# Patient Record
Sex: Male | Born: 1935
Health system: Southern US, Community
[De-identification: ages and names within clinical notes are randomized; demographics above are authoritative.]

## PROBLEM LIST (undated history)

## (undated) DIAGNOSIS — Z7189 Other specified counseling: Secondary | ICD-10-CM

## (undated) DIAGNOSIS — C9002 Multiple myeloma in relapse: Secondary | ICD-10-CM

## (undated) DIAGNOSIS — C9000 Multiple myeloma not having achieved remission: Secondary | ICD-10-CM

## (undated) DIAGNOSIS — N189 Chronic kidney disease, unspecified: Secondary | ICD-10-CM

## (undated) DIAGNOSIS — I10 Essential (primary) hypertension: Secondary | ICD-10-CM

## (undated) DIAGNOSIS — IMO0002 Reserved for concepts with insufficient information to code with codable children: Secondary | ICD-10-CM

## (undated) DIAGNOSIS — K4090 Unilateral inguinal hernia, without obstruction or gangrene, not specified as recurrent: Secondary | ICD-10-CM

## (undated) DIAGNOSIS — IMO0001 Reserved for inherently not codable concepts without codable children: Secondary | ICD-10-CM

## (undated) DIAGNOSIS — Z5189 Encounter for other specified aftercare: Secondary | ICD-10-CM

## (undated) DIAGNOSIS — I639 Cerebral infarction, unspecified: Secondary | ICD-10-CM

## (undated) DIAGNOSIS — D649 Anemia, unspecified: Secondary | ICD-10-CM

## (undated) DIAGNOSIS — Z923 Personal history of irradiation: Secondary | ICD-10-CM

## (undated) HISTORY — DX: Encounter for other specified aftercare: Z51.89

## (undated) HISTORY — DX: Reserved for concepts with insufficient information to code with codable children: IMO0002

## (undated) HISTORY — PX: TONSILLECTOMY: SUR1361

## (undated) HISTORY — DX: Hypercalcemia: E83.52

## (undated) HISTORY — PX: COLONOSCOPY: SHX174

## (undated) HISTORY — DX: Anemia, unspecified: D64.9

## (undated) HISTORY — DX: Multiple myeloma in relapse: C90.02

## (undated) HISTORY — DX: Cerebral infarction, unspecified: I63.9

## (undated) HISTORY — DX: Reserved for inherently not codable concepts without codable children: IMO0001

## (undated) HISTORY — DX: Personal history of irradiation: Z92.3

---
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
## 2000-02-19 ENCOUNTER — Ambulatory Visit (HOSPITAL_COMMUNITY): Admission: RE | Admit: 2000-02-19 | Discharge: 2000-02-19 | Payer: Self-pay | Admitting: Gastroenterology

## 2006-02-18 ENCOUNTER — Ambulatory Visit: Payer: Self-pay | Admitting: Hematology & Oncology

## 2006-04-03 LAB — CBC WITH DIFFERENTIAL/PLATELET
Basophils Absolute: 0 10*3/uL (ref 0.0–0.1)
Eosinophils Absolute: 0.1 10*3/uL (ref 0.0–0.5)
HGB: 12.1 g/dL — ABNORMAL LOW (ref 13.0–17.1)
MCV: 94.2 fL (ref 81.6–98.0)
MONO%: 9.3 % (ref 0.0–13.0)
NEUT#: 1.7 10*3/uL (ref 1.5–6.5)
RBC: 3.63 10*6/uL — ABNORMAL LOW (ref 4.20–5.71)
RDW: 13.7 % (ref 11.2–14.6)
WBC: 2.5 10*3/uL — ABNORMAL LOW (ref 4.0–10.0)
lymph#: 0.5 10*3/uL — ABNORMAL LOW (ref 0.9–3.3)

## 2006-04-03 LAB — CHCC SMEAR

## 2006-04-06 LAB — KAPPA/LAMBDA LIGHT CHAINS
Kappa free light chain: 1.41 mg/dL (ref 0.33–1.94)
Kappa:Lambda Ratio: 0.08 — ABNORMAL LOW (ref 0.26–1.65)
Lambda Free Lght Chn: 18.8 mg/dL — ABNORMAL HIGH (ref 0.57–2.63)

## 2006-04-06 LAB — PROTEIN ELECTROPHORESIS, SERUM
Albumin ELP: 39.6 % — ABNORMAL LOW (ref 55.8–66.1)
Alpha-2-Globulin: 7.9 % (ref 7.1–11.8)
Beta Globulin: 3.8 % — ABNORMAL LOW (ref 4.7–7.2)
M-Spike, %: 2.8 g/dL
Total Protein, Serum Electrophoresis: 8.6 g/dL — ABNORMAL HIGH (ref 6.0–8.3)

## 2006-04-06 LAB — BETA 2 MICROGLOBULIN, SERUM: Beta-2 Microglobulin: 1.96 mg/L — ABNORMAL HIGH (ref 1.01–1.73)

## 2006-04-06 LAB — IGG, IGA, IGM: IgG (Immunoglobin G), Serum: 4440 mg/dL — ABNORMAL HIGH (ref 694–1618)

## 2006-04-09 LAB — UIFE/LIGHT CHAINS/TP QN, 24-HR UR
Beta, Urine: DETECTED — AB
Free Kappa Lt Chains,Ur: 1.72 mg/dL — ABNORMAL HIGH (ref 0.04–1.51)
Free Lambda Excretion/Day: 45.18 mg/d
Free Lambda Lt Chains,Ur: 2.51 mg/dL — ABNORMAL HIGH (ref 0.08–1.01)
Free Lt Chn Excr Rate: 30.96 mg/d
Gamma Globulin, Urine: DETECTED — AB
Time: 24 hours
Volume, Urine: 1800 mL

## 2006-04-11 ENCOUNTER — Ambulatory Visit: Payer: Self-pay | Admitting: Hematology & Oncology

## 2006-08-15 ENCOUNTER — Ambulatory Visit: Payer: Self-pay | Admitting: Hematology & Oncology

## 2006-08-23 LAB — KAPPA/LAMBDA LIGHT CHAINS: Lambda Free Lght Chn: 22.9 mg/dL — ABNORMAL HIGH (ref 0.57–2.63)

## 2006-08-23 LAB — PROTEIN ELECTROPHORESIS, SERUM
Albumin ELP: 39.8 % — ABNORMAL LOW (ref 55.8–66.1)
Alpha-1-Globulin: 3.3 % (ref 2.9–4.9)
Beta 2: 2.2 % — ABNORMAL LOW (ref 3.2–6.5)
Beta Globulin: 4 % — ABNORMAL LOW (ref 4.7–7.2)
Total Protein, Serum Electrophoresis: 9.1 g/dL — ABNORMAL HIGH (ref 6.0–8.3)

## 2006-08-23 LAB — BETA 2 MICROGLOBULIN, SERUM: Beta-2 Microglobulin: 2.21 mg/L — ABNORMAL HIGH (ref 1.01–1.73)

## 2006-12-22 ENCOUNTER — Ambulatory Visit: Payer: Self-pay | Admitting: Hematology & Oncology

## 2006-12-24 LAB — CBC WITH DIFFERENTIAL/PLATELET
Basophils Absolute: 0 10*3/uL (ref 0.0–0.1)
EOS%: 3.2 % (ref 0.0–7.0)
HCT: 32.7 % — ABNORMAL LOW (ref 38.7–49.9)
HGB: 11.8 g/dL — ABNORMAL LOW (ref 13.0–17.1)
MCH: 33.9 pg — ABNORMAL HIGH (ref 28.0–33.4)
MCV: 94 fL (ref 81.6–98.0)
MONO%: 10.4 % (ref 0.0–13.0)
NEUT%: 63.3 % (ref 40.0–75.0)
RDW: 13.2 % (ref 11.2–14.6)

## 2006-12-26 LAB — PROTEIN ELECTROPHORESIS, SERUM
Beta Globulin: 3.9 % — ABNORMAL LOW (ref 4.7–7.2)
Gamma Globulin: 43.8 % — ABNORMAL HIGH (ref 11.1–18.8)
M-Spike, %: 3.74 g/dL
Total Protein, Serum Electrophoresis: 8.9 g/dL — ABNORMAL HIGH (ref 6.0–8.3)

## 2006-12-26 LAB — COMPREHENSIVE METABOLIC PANEL
ALT: 12 U/L (ref 0–53)
AST: 13 U/L (ref 0–37)
Albumin: 3 g/dL — ABNORMAL LOW (ref 3.5–5.2)
Alkaline Phosphatase: 60 U/L (ref 39–117)
BUN: 22 mg/dL (ref 6–23)
CO2: 27 mEq/L (ref 19–32)
Calcium: 8.2 mg/dL — ABNORMAL LOW (ref 8.4–10.5)
Chloride: 104 mEq/L (ref 96–112)
Creatinine, Ser: 0.99 mg/dL (ref 0.40–1.50)
Glucose, Bld: 87 mg/dL (ref 70–99)
Potassium: 4.3 mEq/L (ref 3.5–5.3)
Sodium: 135 mEq/L (ref 135–145)
Total Bilirubin: 0.6 mg/dL (ref 0.3–1.2)
Total Protein: 8.9 g/dL — ABNORMAL HIGH (ref 6.0–8.3)

## 2006-12-26 LAB — IGG, IGA, IGM
IgG (Immunoglobin G), Serum: 4960 mg/dL — ABNORMAL HIGH (ref 694–1618)
IgM, Serum: 24 mg/dL — ABNORMAL LOW (ref 60–263)

## 2006-12-26 LAB — KAPPA/LAMBDA LIGHT CHAINS
Kappa:Lambda Ratio: 0.03 — ABNORMAL LOW (ref 0.26–1.65)
Lambda Free Lght Chn: 22.1 mg/dL — ABNORMAL HIGH (ref 0.57–2.63)

## 2006-12-26 LAB — BETA 2 MICROGLOBULIN, SERUM: Beta-2 Microglobulin: 2.47 mg/L — ABNORMAL HIGH (ref 1.01–1.73)

## 2007-04-20 ENCOUNTER — Ambulatory Visit: Payer: Self-pay | Admitting: Hematology & Oncology

## 2007-04-22 LAB — CBC WITH DIFFERENTIAL/PLATELET
BASO%: 0.3 % (ref 0.0–2.0)
Eosinophils Absolute: 0.1 10*3/uL (ref 0.0–0.5)
LYMPH%: 18.9 % (ref 14.0–48.0)
MCHC: 36.5 g/dL — ABNORMAL HIGH (ref 32.0–35.9)
MCV: 94.4 fL (ref 81.6–98.0)
MONO%: 9.4 % (ref 0.0–13.0)
NEUT#: 1.9 10*3/uL (ref 1.5–6.5)
RBC: 3.28 10*6/uL — ABNORMAL LOW (ref 4.20–5.71)
RDW: 13.8 % (ref 11.2–14.6)
WBC: 2.8 10*3/uL — ABNORMAL LOW (ref 4.0–10.0)

## 2007-04-24 LAB — COMPREHENSIVE METABOLIC PANEL
ALT: 16 U/L (ref 0–53)
AST: 18 U/L (ref 0–37)
Albumin: 3.1 g/dL — ABNORMAL LOW (ref 3.5–5.2)
Alkaline Phosphatase: 55 U/L (ref 39–117)
Glucose, Bld: 83 mg/dL (ref 70–99)
Potassium: 4.3 mEq/L (ref 3.5–5.3)
Sodium: 133 mEq/L — ABNORMAL LOW (ref 135–145)
Total Bilirubin: 0.7 mg/dL (ref 0.3–1.2)
Total Protein: 9.1 g/dL — ABNORMAL HIGH (ref 6.0–8.3)

## 2007-04-24 LAB — PROTEIN ELECTROPHORESIS, SERUM
Alpha-2-Globulin: 7.6 % (ref 7.1–11.8)
Beta 2: 1.9 % — ABNORMAL LOW (ref 3.2–6.5)
Beta Globulin: 4 % — ABNORMAL LOW (ref 4.7–7.2)
M-Spike, %: 3.85 g/dL
Total Protein, Serum Electrophoresis: 9.1 g/dL — ABNORMAL HIGH (ref 6.0–8.3)

## 2007-04-24 LAB — KAPPA/LAMBDA LIGHT CHAINS
Kappa free light chain: 1.24 mg/dL (ref 0.33–1.94)
Kappa:Lambda Ratio: 0.06 — ABNORMAL LOW (ref 0.26–1.65)
Lambda Free Lght Chn: 22.5 mg/dL — ABNORMAL HIGH (ref 0.57–2.63)

## 2007-04-24 LAB — IGG, IGA, IGM: IgG (Immunoglobin G), Serum: 5170 mg/dL — ABNORMAL HIGH (ref 694–1618)

## 2008-09-21 ENCOUNTER — Encounter: Admission: RE | Admit: 2008-09-21 | Discharge: 2008-09-21 | Payer: Self-pay | Admitting: Otolaryngology

## 2008-09-26 ENCOUNTER — Encounter (INDEPENDENT_AMBULATORY_CARE_PROVIDER_SITE_OTHER): Payer: Self-pay | Admitting: Otolaryngology

## 2008-09-26 ENCOUNTER — Ambulatory Visit (HOSPITAL_BASED_OUTPATIENT_CLINIC_OR_DEPARTMENT_OTHER): Admission: RE | Admit: 2008-09-26 | Discharge: 2008-09-26 | Payer: Self-pay | Admitting: Otolaryngology

## 2009-02-23 ENCOUNTER — Ambulatory Visit: Payer: Self-pay | Admitting: Hematology & Oncology

## 2009-03-22 LAB — CBC WITH DIFFERENTIAL (CANCER CENTER ONLY)
BASO%: 0.2 % (ref 0.0–2.0)
EOS%: 2.2 % (ref 0.0–7.0)
HCT: 24.5 % — ABNORMAL LOW (ref 38.7–49.9)
LYMPH%: 25.1 % (ref 14.0–48.0)
MCH: 33.7 pg — ABNORMAL HIGH (ref 28.0–33.4)
MCHC: 35.1 g/dL (ref 32.0–35.9)
MCV: 96 fL (ref 82–98)
MONO%: 5.9 % (ref 0.0–13.0)
NEUT%: 66.6 % (ref 40.0–80.0)
RDW: 11.8 % (ref 10.5–14.6)

## 2009-03-24 LAB — COMPREHENSIVE METABOLIC PANEL
Albumin: 2.4 g/dL — ABNORMAL LOW (ref 3.5–5.2)
Alkaline Phosphatase: 57 U/L (ref 39–117)
BUN: 25 mg/dL — ABNORMAL HIGH (ref 6–23)
CO2: 22 mEq/L (ref 19–32)
Glucose, Bld: 100 mg/dL — ABNORMAL HIGH (ref 70–99)
Potassium: 4.4 mEq/L (ref 3.5–5.3)
Sodium: 133 mEq/L — ABNORMAL LOW (ref 135–145)
Total Protein: 9.3 g/dL — ABNORMAL HIGH (ref 6.0–8.3)

## 2009-03-24 LAB — LACTATE DEHYDROGENASE: LDH: 131 U/L (ref 94–250)

## 2009-03-24 LAB — SPEP & IFE WITH QIG
Alpha-1-Globulin: 3.3 % (ref 2.9–4.9)
Beta 2: 1.3 % — ABNORMAL LOW (ref 3.2–6.5)
Beta Globulin: 4 % — ABNORMAL LOW (ref 4.7–7.2)
Gamma Globulin: 48.7 % — ABNORMAL HIGH (ref 11.1–18.8)
IgA: 12 mg/dL — ABNORMAL LOW (ref 68–378)
IgM, Serum: 13 mg/dL — ABNORMAL LOW (ref 60–263)

## 2009-03-24 LAB — KAPPA/LAMBDA LIGHT CHAINS: Lambda Free Lght Chn: 63.7 mg/dL — ABNORMAL HIGH (ref 0.57–2.63)

## 2009-03-24 LAB — FERRITIN: Ferritin: 136 ng/mL (ref 22–322)

## 2009-03-24 LAB — ERYTHROPOIETIN: Erythropoietin: 41.9 m[IU]/mL — ABNORMAL HIGH (ref 2.6–34.0)

## 2009-03-24 LAB — BETA 2 MICROGLOBULIN, SERUM: Beta-2 Microglobulin: 4.75 mg/L — ABNORMAL HIGH (ref 1.01–1.73)

## 2009-03-27 ENCOUNTER — Ambulatory Visit: Payer: Self-pay | Admitting: Hematology & Oncology

## 2009-03-28 ENCOUNTER — Encounter: Payer: Self-pay | Admitting: Hematology & Oncology

## 2009-03-28 ENCOUNTER — Other Ambulatory Visit: Admission: RE | Admit: 2009-03-28 | Discharge: 2009-03-28 | Payer: Self-pay | Admitting: Hematology & Oncology

## 2009-03-28 LAB — CBC WITH DIFFERENTIAL (CANCER CENTER ONLY)
EOS%: 3.2 % (ref 0.0–7.0)
HGB: 8.4 g/dL — ABNORMAL LOW (ref 13.0–17.1)
LYMPH#: 0.6 10*3/uL — ABNORMAL LOW (ref 0.9–3.3)
MCH: 33 pg (ref 28.0–33.4)
MCHC: 34.3 g/dL (ref 32.0–35.9)
MONO%: 6.8 % (ref 0.0–13.0)
NEUT#: 1.4 10*3/uL — ABNORMAL LOW (ref 1.5–6.5)
Platelets: 205 10*3/uL (ref 145–400)
RBC: 2.56 10*6/uL — ABNORMAL LOW (ref 4.20–5.70)

## 2009-04-06 LAB — CBC WITH DIFFERENTIAL (CANCER CENTER ONLY)
BASO%: 0.4 % (ref 0.0–2.0)
EOS%: 4.5 % (ref 0.0–7.0)
HCT: 25 % — ABNORMAL LOW (ref 38.7–49.9)
LYMPH#: 0.6 10*3/uL — ABNORMAL LOW (ref 0.9–3.3)
MCHC: 35.2 g/dL (ref 32.0–35.9)
NEUT#: 1.1 10*3/uL — ABNORMAL LOW (ref 1.5–6.5)
NEUT%: 57.3 % (ref 40.0–80.0)
Platelets: 224 10*3/uL (ref 145–400)
RDW: 11.5 % (ref 10.5–14.6)

## 2009-04-06 LAB — COMPREHENSIVE METABOLIC PANEL
Albumin: 2.3 g/dL — ABNORMAL LOW (ref 3.5–5.2)
BUN: 28 mg/dL — ABNORMAL HIGH (ref 6–23)
CO2: 25 mEq/L (ref 19–32)
Calcium: 8 mg/dL — ABNORMAL LOW (ref 8.4–10.5)
Chloride: 103 mEq/L (ref 96–112)
Glucose, Bld: 94 mg/dL (ref 70–99)
Potassium: 5.2 mEq/L (ref 3.5–5.3)
Sodium: 132 mEq/L — ABNORMAL LOW (ref 135–145)
Total Protein: 9.2 g/dL — ABNORMAL HIGH (ref 6.0–8.3)

## 2009-04-12 DIAGNOSIS — C9000 Multiple myeloma not having achieved remission: Secondary | ICD-10-CM

## 2009-04-12 HISTORY — DX: Multiple myeloma not having achieved remission: C90.00

## 2009-04-14 LAB — BASIC METABOLIC PANEL
CO2: 23 mEq/L (ref 19–32)
Calcium: 7.8 mg/dL — ABNORMAL LOW (ref 8.4–10.5)
Chloride: 107 mEq/L (ref 96–112)
Glucose, Bld: 109 mg/dL — ABNORMAL HIGH (ref 70–99)
Potassium: 4.7 mEq/L (ref 3.5–5.3)
Sodium: 132 mEq/L — ABNORMAL LOW (ref 135–145)

## 2009-04-21 LAB — CBC WITH DIFFERENTIAL (CANCER CENTER ONLY)
BASO#: 0.1 10*3/uL (ref 0.0–0.2)
BASO%: 2 % (ref 0.0–2.0)
EOS%: 6.2 % (ref 0.0–7.0)
HGB: 8.1 g/dL — ABNORMAL LOW (ref 13.0–17.1)
LYMPH#: 0.3 10*3/uL — ABNORMAL LOW (ref 0.9–3.3)
MCH: 33.4 pg (ref 28.0–33.4)
MCHC: 34.6 g/dL (ref 32.0–35.9)
MONO%: 3.7 % (ref 0.0–13.0)
NEUT#: 2.8 10*3/uL (ref 1.5–6.5)
NEUT%: 79.4 % (ref 40.0–80.0)
RDW: 11.7 % (ref 10.5–14.6)

## 2009-04-27 ENCOUNTER — Ambulatory Visit: Payer: Self-pay | Admitting: Hematology & Oncology

## 2009-04-28 LAB — CBC WITH DIFFERENTIAL (CANCER CENTER ONLY)
BASO#: 0 10*3/uL (ref 0.0–0.2)
BASO%: 0.5 % (ref 0.0–2.0)
Eosinophils Absolute: 0.2 10*3/uL (ref 0.0–0.5)
HCT: 25.1 % — ABNORMAL LOW (ref 38.7–49.9)
HGB: 8.9 g/dL — ABNORMAL LOW (ref 13.0–17.1)
LYMPH#: 0.4 10*3/uL — ABNORMAL LOW (ref 0.9–3.3)
LYMPH%: 16.9 % (ref 14.0–48.0)
MCV: 96 fL (ref 82–98)
MONO#: 0.3 10*3/uL (ref 0.1–0.9)
NEUT%: 63.8 % (ref 40.0–80.0)
RDW: 12.1 % (ref 10.5–14.6)
WBC: 2.1 10*3/uL — ABNORMAL LOW (ref 4.0–10.0)

## 2009-05-05 LAB — CBC WITH DIFFERENTIAL (CANCER CENTER ONLY)
BASO#: 0 10*3/uL (ref 0.0–0.2)
BASO%: 0.7 % (ref 0.0–2.0)
EOS%: 7.5 % — ABNORMAL HIGH (ref 0.0–7.0)
HCT: 27.9 % — ABNORMAL LOW (ref 38.7–49.9)
HGB: 9.7 g/dL — ABNORMAL LOW (ref 13.0–17.1)
LYMPH%: 18 % (ref 14.0–48.0)
MCH: 33 pg (ref 28.0–33.4)
MCHC: 34.6 g/dL (ref 32.0–35.9)
MCV: 95 fL (ref 82–98)
MONO%: 10 % (ref 0.0–13.0)
NEUT%: 63.8 % (ref 40.0–80.0)
RDW: 12.4 % (ref 10.5–14.6)

## 2009-05-05 LAB — CMP (CANCER CENTER ONLY)
ALT(SGPT): 15 U/L (ref 10–47)
BUN, Bld: 21 mg/dL (ref 7–22)
CO2: 27 mEq/L (ref 18–33)
Calcium: 8.2 mg/dL (ref 8.0–10.3)
Chloride: 105 mEq/L (ref 98–108)
Creat: 1.4 mg/dl — ABNORMAL HIGH (ref 0.6–1.2)
Glucose, Bld: 122 mg/dL — ABNORMAL HIGH (ref 73–118)
Total Bilirubin: 0.8 mg/dl (ref 0.20–1.60)

## 2009-05-09 LAB — PROTEIN ELECTROPHORESIS, SERUM
Alpha-2-Globulin: 12.2 % — ABNORMAL HIGH (ref 7.1–11.8)
Beta Globulin: 5.2 % (ref 4.7–7.2)
Gamma Globulin: 33.9 % — ABNORMAL HIGH (ref 11.1–18.8)
M-Spike, %: 2.26 g/dL

## 2009-05-09 LAB — BETA 2 MICROGLOBULIN, SERUM: Beta-2 Microglobulin: 3.63 mg/L — ABNORMAL HIGH (ref 1.01–1.73)

## 2009-05-09 LAB — KAPPA/LAMBDA LIGHT CHAINS
Kappa free light chain: 1.24 mg/dL (ref 0.33–1.94)
Lambda Free Lght Chn: 17.8 mg/dL — ABNORMAL HIGH (ref 0.57–2.63)

## 2009-05-09 LAB — IGG, IGA, IGM
IgA: 24 mg/dL — ABNORMAL LOW (ref 68–378)
IgG (Immunoglobin G), Serum: 3140 mg/dL — ABNORMAL HIGH (ref 694–1618)

## 2009-05-18 LAB — CMP (CANCER CENTER ONLY)
ALT(SGPT): 17 U/L (ref 10–47)
Albumin: 2.6 g/dL — ABNORMAL LOW (ref 3.3–5.5)
CO2: 30 mEq/L (ref 18–33)
Calcium: 8.7 mg/dL (ref 8.0–10.3)
Chloride: 107 mEq/L (ref 98–108)
Glucose, Bld: 135 mg/dL — ABNORMAL HIGH (ref 73–118)
Sodium: 142 mEq/L (ref 128–145)
Total Bilirubin: 0.7 mg/dl (ref 0.20–1.60)
Total Protein: 6.7 g/dL (ref 6.4–8.1)

## 2009-05-18 LAB — CBC WITH DIFFERENTIAL (CANCER CENTER ONLY)
BASO#: 0 10*3/uL (ref 0.0–0.2)
Eosinophils Absolute: 0.2 10*3/uL (ref 0.0–0.5)
HGB: 9.4 g/dL — ABNORMAL LOW (ref 13.0–17.1)
MCH: 33.1 pg (ref 28.0–33.4)
MONO#: 0.2 10*3/uL (ref 0.1–0.9)
MONO%: 6.9 % (ref 0.0–13.0)
NEUT#: 1.7 10*3/uL (ref 1.5–6.5)
RBC: 2.84 10*6/uL — ABNORMAL LOW (ref 4.20–5.70)
WBC: 2.7 10*3/uL — ABNORMAL LOW (ref 4.0–10.0)

## 2009-05-26 LAB — CBC WITH DIFFERENTIAL (CANCER CENTER ONLY)
Eosinophils Absolute: 0.4 10*3/uL (ref 0.0–0.5)
HCT: 31.1 % — ABNORMAL LOW (ref 38.7–49.9)
LYMPH#: 0.6 10*3/uL — ABNORMAL LOW (ref 0.9–3.3)
LYMPH%: 15.1 % (ref 14.0–48.0)
MCV: 95 fL (ref 82–98)
MONO#: 0.5 10*3/uL (ref 0.1–0.9)
Platelets: 196 10*3/uL (ref 145–400)
RBC: 3.29 10*6/uL — ABNORMAL LOW (ref 4.20–5.70)
WBC: 3.9 10*3/uL — ABNORMAL LOW (ref 4.0–10.0)

## 2009-05-26 LAB — BASIC METABOLIC PANEL
Calcium: 7.6 mg/dL — ABNORMAL LOW (ref 8.4–10.5)
Glucose, Bld: 130 mg/dL — ABNORMAL HIGH (ref 70–99)
Potassium: 4.3 mEq/L (ref 3.5–5.3)
Sodium: 141 mEq/L (ref 135–145)

## 2009-06-02 ENCOUNTER — Ambulatory Visit: Payer: Self-pay | Admitting: Hematology & Oncology

## 2009-06-02 LAB — CBC WITH DIFFERENTIAL (CANCER CENTER ONLY)
BASO#: 0 10*3/uL (ref 0.0–0.2)
Eosinophils Absolute: 0.9 10*3/uL — ABNORMAL HIGH (ref 0.0–0.5)
HCT: 33.4 % — ABNORMAL LOW (ref 38.7–49.9)
HGB: 11.4 g/dL — ABNORMAL LOW (ref 13.0–17.1)
LYMPH%: 14.3 % (ref 14.0–48.0)
MCH: 32.3 pg (ref 28.0–33.4)
MCV: 95 fL (ref 82–98)
MONO#: 0.5 10*3/uL (ref 0.1–0.9)
MONO%: 12.3 % (ref 0.0–13.0)
NEUT%: 48.2 % (ref 40.0–80.0)
Platelets: 148 10*3/uL (ref 145–400)
RBC: 3.53 10*6/uL — ABNORMAL LOW (ref 4.20–5.70)
WBC: 3.8 10*3/uL — ABNORMAL LOW (ref 4.0–10.0)

## 2009-06-09 LAB — CBC WITH DIFFERENTIAL (CANCER CENTER ONLY)
BASO%: 0.8 % (ref 0.0–2.0)
HCT: 34 % — ABNORMAL LOW (ref 38.7–49.9)
LYMPH%: 20.4 % (ref 14.0–48.0)
MCH: 31.7 pg (ref 28.0–33.4)
MCHC: 34.4 g/dL (ref 32.0–35.9)
MCV: 92 fL (ref 82–98)
MONO#: 0.4 10*3/uL (ref 0.1–0.9)
MONO%: 10.2 % (ref 0.0–13.0)
NEUT%: 62.5 % (ref 40.0–80.0)
Platelets: 211 10*3/uL (ref 145–400)
RDW: 12.9 % (ref 10.5–14.6)

## 2009-06-29 LAB — CBC WITH DIFFERENTIAL (CANCER CENTER ONLY)
BASO#: 0 10*3/uL (ref 0.0–0.2)
EOS%: 15.3 % — ABNORMAL HIGH (ref 0.0–7.0)
Eosinophils Absolute: 0.6 10*3/uL — ABNORMAL HIGH (ref 0.0–0.5)
HGB: 10.4 g/dL — ABNORMAL LOW (ref 13.0–17.1)
LYMPH#: 0.7 10*3/uL — ABNORMAL LOW (ref 0.9–3.3)
MCHC: 35.4 g/dL (ref 32.0–35.9)
NEUT#: 1.9 10*3/uL (ref 1.5–6.5)
Platelets: 113 10*3/uL — ABNORMAL LOW (ref 145–400)
RBC: 3.38 10*6/uL — ABNORMAL LOW (ref 4.20–5.70)

## 2009-07-03 LAB — COMPREHENSIVE METABOLIC PANEL
ALT: 15 U/L (ref 0–53)
Albumin: 3.2 g/dL — ABNORMAL LOW (ref 3.5–5.2)
CO2: 25 mEq/L (ref 19–32)
Calcium: 8.1 mg/dL — ABNORMAL LOW (ref 8.4–10.5)
Chloride: 109 mEq/L (ref 96–112)
Glucose, Bld: 86 mg/dL (ref 70–99)
Potassium: 4.1 mEq/L (ref 3.5–5.3)
Sodium: 141 mEq/L (ref 135–145)
Total Protein: 5.4 g/dL — ABNORMAL LOW (ref 6.0–8.3)

## 2009-07-03 LAB — IGG, IGA, IGM
IgG (Immunoglobin G), Serum: 1070 mg/dL (ref 694–1618)
IgM, Serum: 33 mg/dL — ABNORMAL LOW (ref 60–263)

## 2009-07-03 LAB — PROTEIN ELECTROPHORESIS, SERUM: Gamma Globulin: 17.9 % (ref 11.1–18.8)

## 2009-07-03 LAB — KAPPA/LAMBDA LIGHT CHAINS
Kappa free light chain: 1.62 mg/dL (ref 0.33–1.94)
Lambda Free Lght Chn: 7.21 mg/dL — ABNORMAL HIGH (ref 0.57–2.63)

## 2009-07-05 ENCOUNTER — Ambulatory Visit: Payer: Self-pay | Admitting: Hematology & Oncology

## 2009-07-07 LAB — CBC WITH DIFFERENTIAL (CANCER CENTER ONLY)
BASO%: 0.7 % (ref 0.0–2.0)
EOS%: 7.1 % — ABNORMAL HIGH (ref 0.0–7.0)
LYMPH#: 0.9 10*3/uL (ref 0.9–3.3)
MCHC: 35 g/dL (ref 32.0–35.9)
NEUT#: 1.6 10*3/uL (ref 1.5–6.5)
Platelets: 178 10*3/uL (ref 145–400)
RDW: 12.7 % (ref 10.5–14.6)

## 2009-07-26 LAB — CBC WITH DIFFERENTIAL (CANCER CENTER ONLY)
BASO#: 0 10*3/uL (ref 0.0–0.2)
EOS%: 12.2 % — ABNORMAL HIGH (ref 0.0–7.0)
Eosinophils Absolute: 0.5 10*3/uL (ref 0.0–0.5)
HGB: 10.8 g/dL — ABNORMAL LOW (ref 13.0–17.1)
LYMPH%: 17.1 % (ref 14.0–48.0)
MCH: 31.4 pg (ref 28.0–33.4)
MCHC: 35.9 g/dL (ref 32.0–35.9)
MCV: 88 fL (ref 82–98)
MONO%: 10.2 % (ref 0.0–13.0)
Platelets: 131 10*3/uL — ABNORMAL LOW (ref 145–400)
RBC: 3.43 10*6/uL — ABNORMAL LOW (ref 4.20–5.70)

## 2009-07-28 LAB — COMPREHENSIVE METABOLIC PANEL
ALT: 14 U/L (ref 0–53)
CO2: 26 mEq/L (ref 19–32)
Calcium: 7.7 mg/dL — ABNORMAL LOW (ref 8.4–10.5)
Chloride: 105 mEq/L (ref 96–112)
Potassium: 4 mEq/L (ref 3.5–5.3)
Sodium: 140 mEq/L (ref 135–145)
Total Protein: 5.5 g/dL — ABNORMAL LOW (ref 6.0–8.3)

## 2009-07-28 LAB — PROTEIN ELECTROPHORESIS, SERUM
Beta 2: 3.9 % (ref 3.2–6.5)
Beta Globulin: 6.1 % (ref 4.7–7.2)
M-Spike, %: 0.68 g/dL

## 2009-07-28 LAB — KAPPA/LAMBDA LIGHT CHAINS: Kappa free light chain: 1.65 mg/dL (ref 0.33–1.94)

## 2009-07-28 LAB — IGG, IGA, IGM: IgA: 69 mg/dL (ref 68–378)

## 2009-08-02 LAB — COMPREHENSIVE METABOLIC PANEL
AST: 15 U/L (ref 0–37)
Albumin: 3.3 g/dL — ABNORMAL LOW (ref 3.5–5.2)
BUN: 28 mg/dL — ABNORMAL HIGH (ref 6–23)
Calcium: 8.5 mg/dL (ref 8.4–10.5)
Chloride: 108 mEq/L (ref 96–112)
Glucose, Bld: 111 mg/dL — ABNORMAL HIGH (ref 70–99)
Potassium: 4.2 mEq/L (ref 3.5–5.3)
Sodium: 139 mEq/L (ref 135–145)
Total Protein: 5.9 g/dL — ABNORMAL LOW (ref 6.0–8.3)

## 2009-08-02 LAB — CBC WITH DIFFERENTIAL (CANCER CENTER ONLY)
BASO%: 1.1 % (ref 0.0–2.0)
EOS%: 26.2 % — ABNORMAL HIGH (ref 0.0–7.0)
LYMPH%: 22.3 % (ref 14.0–48.0)
MCH: 31 pg (ref 28.0–33.4)
MCV: 88 fL (ref 82–98)
MONO%: 11.4 % (ref 0.0–13.0)
Platelets: 118 10*3/uL — ABNORMAL LOW (ref 145–400)
RDW: 14.4 % (ref 10.5–14.6)

## 2009-08-08 ENCOUNTER — Ambulatory Visit: Payer: Self-pay | Admitting: Hematology & Oncology

## 2009-08-09 LAB — CBC WITH DIFFERENTIAL (CANCER CENTER ONLY)
BASO%: 0.7 % (ref 0.0–2.0)
EOS%: 4.2 % (ref 0.0–7.0)
HCT: 31.6 % — ABNORMAL LOW (ref 38.7–49.9)
LYMPH#: 0.8 10*3/uL — ABNORMAL LOW (ref 0.9–3.3)
LYMPH%: 27.1 % (ref 14.0–48.0)
MCHC: 34.3 g/dL (ref 32.0–35.9)
MCV: 89 fL (ref 82–98)
MONO%: 10.3 % (ref 0.0–13.0)
NEUT%: 57.7 % (ref 40.0–80.0)
RDW: 14.9 % — ABNORMAL HIGH (ref 10.5–14.6)

## 2009-08-14 LAB — PROTEIN ELECTROPHORESIS, SERUM
Alpha-2-Globulin: 12.3 % — ABNORMAL HIGH (ref 7.1–11.8)
Beta 2: 3.5 % (ref 3.2–6.5)
Gamma Globulin: 16.3 % (ref 11.1–18.8)
M-Spike, %: 0.68 g/dL

## 2009-08-14 LAB — KAPPA/LAMBDA LIGHT CHAINS: Kappa:Lambda Ratio: 0.22 — ABNORMAL LOW (ref 0.26–1.65)

## 2009-08-14 LAB — IGG, IGA, IGM: IgM, Serum: 36 mg/dL — ABNORMAL LOW (ref 60–263)

## 2009-08-14 LAB — BETA 2 MICROGLOBULIN, SERUM: Beta-2 Microglobulin: 2.39 mg/L — ABNORMAL HIGH (ref 1.01–1.73)

## 2009-08-16 LAB — CBC WITH DIFFERENTIAL (CANCER CENTER ONLY)
BASO#: 0 10*3/uL (ref 0.0–0.2)
BASO%: 0.4 % (ref 0.0–2.0)
EOS%: 8.1 % — ABNORMAL HIGH (ref 0.0–7.0)
Eosinophils Absolute: 0.2 10*3/uL (ref 0.0–0.5)
HCT: 30.4 % — ABNORMAL LOW (ref 38.7–49.9)
HGB: 10.5 g/dL — ABNORMAL LOW (ref 13.0–17.1)
LYMPH#: 0.5 10*3/uL — ABNORMAL LOW (ref 0.9–3.3)
LYMPH%: 23 % (ref 14.0–48.0)
MCH: 30.8 pg (ref 28.0–33.4)
MCHC: 34.7 g/dL (ref 32.0–35.9)
MCV: 89 fL (ref 82–98)
MONO#: 0.2 10*3/uL (ref 0.1–0.9)
MONO%: 7.6 % (ref 0.0–13.0)
NEUT#: 1.3 10*3/uL — ABNORMAL LOW (ref 1.5–6.5)
NEUT%: 60.9 % (ref 40.0–80.0)
Platelets: 145 10*3/uL (ref 145–400)
RBC: 3.42 10*6/uL — ABNORMAL LOW (ref 4.20–5.70)
RDW: 14.4 % (ref 10.5–14.6)
WBC: 2.2 10*3/uL — ABNORMAL LOW (ref 4.0–10.0)

## 2009-08-16 LAB — COMPREHENSIVE METABOLIC PANEL
ALT: 16 U/L (ref 0–53)
AST: 18 U/L (ref 0–37)
Albumin: 3 g/dL — ABNORMAL LOW (ref 3.5–5.2)
Alkaline Phosphatase: 60 U/L (ref 39–117)
Glucose, Bld: 75 mg/dL (ref 70–99)
Potassium: 4.3 mEq/L (ref 3.5–5.3)
Sodium: 140 mEq/L (ref 135–145)
Total Protein: 5 g/dL — ABNORMAL LOW (ref 6.0–8.3)

## 2009-08-29 ENCOUNTER — Ambulatory Visit: Payer: Self-pay | Admitting: Hematology & Oncology

## 2009-08-29 ENCOUNTER — Ambulatory Visit (HOSPITAL_COMMUNITY): Admission: RE | Admit: 2009-08-29 | Discharge: 2009-08-29 | Payer: Self-pay | Admitting: Hematology & Oncology

## 2009-09-14 ENCOUNTER — Ambulatory Visit: Payer: Self-pay | Admitting: Hematology & Oncology

## 2009-09-15 LAB — CBC WITH DIFFERENTIAL (CANCER CENTER ONLY)
BASO%: 0.6 % (ref 0.0–2.0)
LYMPH%: 29 % (ref 14.0–48.0)
MCH: 31.2 pg (ref 28.0–33.4)
MCV: 89 fL (ref 82–98)
MONO%: 8.4 % (ref 0.0–13.0)
Platelets: 204 10*3/uL (ref 145–400)
RDW: 15.6 % — ABNORMAL HIGH (ref 10.5–14.6)
WBC: 2.7 10*3/uL — ABNORMAL LOW (ref 4.0–10.0)

## 2009-09-20 LAB — SPEP & IFE WITH QIG
Albumin ELP: 55.6 % — ABNORMAL LOW (ref 55.8–66.1)
Beta 2: 4 % (ref 3.2–6.5)
Beta Globulin: 6.4 % (ref 4.7–7.2)
Gamma Globulin: 15.3 % (ref 11.1–18.8)
IgA: 87 mg/dL (ref 68–378)
IgG (Immunoglobin G), Serum: 1060 mg/dL (ref 694–1618)
IgM, Serum: 41 mg/dL — ABNORMAL LOW (ref 60–263)
M-Spike, %: 0.52 g/dL

## 2009-09-20 LAB — COMPREHENSIVE METABOLIC PANEL
ALT: 11 U/L (ref 0–53)
AST: 14 U/L (ref 0–37)
Alkaline Phosphatase: 66 U/L (ref 39–117)
Sodium: 139 mEq/L (ref 135–145)
Total Bilirubin: 0.8 mg/dL (ref 0.3–1.2)
Total Protein: 5.7 g/dL — ABNORMAL LOW (ref 6.0–8.3)

## 2009-09-20 LAB — KAPPA/LAMBDA LIGHT CHAINS
Kappa:Lambda Ratio: 0.17 — ABNORMAL LOW (ref 0.26–1.65)
Lambda Free Lght Chn: 6.53 mg/dL — ABNORMAL HIGH (ref 0.57–2.63)

## 2009-11-01 ENCOUNTER — Ambulatory Visit: Payer: Self-pay | Admitting: Hematology & Oncology

## 2009-11-03 LAB — CBC WITH DIFFERENTIAL (CANCER CENTER ONLY)
BASO#: 0 10*3/uL (ref 0.0–0.2)
EOS%: 9.9 % — ABNORMAL HIGH (ref 0.0–7.0)
Eosinophils Absolute: 0.4 10*3/uL (ref 0.0–0.5)
HCT: 31.2 % — ABNORMAL LOW (ref 38.7–49.9)
HGB: 10.5 g/dL — ABNORMAL LOW (ref 13.0–17.1)
LYMPH%: 19 % (ref 14.0–48.0)
MCH: 31.8 pg (ref 28.0–33.4)
MCHC: 33.6 g/dL (ref 32.0–35.9)
MCV: 95 fL (ref 82–98)
MONO%: 6.6 % (ref 0.0–13.0)
NEUT#: 2.4 10*3/uL (ref 1.5–6.5)
NEUT%: 63.6 % (ref 40.0–80.0)
RBC: 3.29 10*6/uL — ABNORMAL LOW (ref 4.20–5.70)

## 2009-11-07 LAB — COMPREHENSIVE METABOLIC PANEL
Albumin: 3.6 g/dL (ref 3.5–5.2)
Alkaline Phosphatase: 66 U/L (ref 39–117)
BUN: 23 mg/dL (ref 6–23)
Creatinine, Ser: 1.24 mg/dL (ref 0.40–1.50)
Glucose, Bld: 90 mg/dL (ref 70–99)
Potassium: 4.3 mEq/L (ref 3.5–5.3)

## 2009-11-07 LAB — IGG, IGA, IGM
IgA: 85 mg/dL (ref 68–378)
IgG (Immunoglobin G), Serum: 1140 mg/dL (ref 694–1618)

## 2009-11-07 LAB — KAPPA/LAMBDA LIGHT CHAINS
Kappa:Lambda Ratio: 0.31 (ref 0.26–1.65)
Lambda Free Lght Chn: 4.61 mg/dL — ABNORMAL HIGH (ref 0.57–2.63)

## 2009-11-07 LAB — PROTEIN ELECTROPHORESIS, SERUM
Alpha-1-Globulin: 5.4 % — ABNORMAL HIGH (ref 2.9–4.9)
Gamma Globulin: 16.2 % (ref 11.1–18.8)
M-Spike, %: 0.59 g/dL
Total Protein, Serum Electrophoresis: 6 g/dL (ref 6.0–8.3)

## 2009-12-13 ENCOUNTER — Ambulatory Visit: Payer: Self-pay | Admitting: Hematology & Oncology

## 2009-12-15 LAB — CBC WITH DIFFERENTIAL (CANCER CENTER ONLY)
BASO#: 0 10*3/uL (ref 0.0–0.2)
Eosinophils Absolute: 0.4 10*3/uL (ref 0.0–0.5)
HCT: 30.1 % — ABNORMAL LOW (ref 38.7–49.9)
HGB: 10.5 g/dL — ABNORMAL LOW (ref 13.0–17.1)
LYMPH#: 0.7 10*3/uL — ABNORMAL LOW (ref 0.9–3.3)
MCHC: 34.8 g/dL (ref 32.0–35.9)
NEUT#: 3 10*3/uL (ref 1.5–6.5)
NEUT%: 67 % (ref 40.0–80.0)
RBC: 3.27 10*6/uL — ABNORMAL LOW (ref 4.20–5.70)

## 2010-02-09 ENCOUNTER — Ambulatory Visit: Payer: Self-pay | Admitting: Hematology & Oncology

## 2010-02-16 LAB — CBC WITH DIFFERENTIAL (CANCER CENTER ONLY)
BASO%: 0.8 % (ref 0.0–2.0)
EOS%: 15.7 % — ABNORMAL HIGH (ref 0.0–7.0)
Eosinophils Absolute: 0.4 10*3/uL (ref 0.0–0.5)
LYMPH%: 27.6 % (ref 14.0–48.0)
MCH: 32.1 pg (ref 28.0–33.4)
MCV: 93 fL (ref 82–98)
MONO%: 11.9 % (ref 0.0–13.0)
NEUT#: 1.1 10*3/uL — ABNORMAL LOW (ref 1.5–6.5)
Platelets: 115 10*3/uL — ABNORMAL LOW (ref 145–400)
RBC: 3.2 10*6/uL — ABNORMAL LOW (ref 4.20–5.70)
RDW: 13.8 % (ref 10.5–14.6)

## 2010-02-20 LAB — KAPPA/LAMBDA LIGHT CHAINS: Kappa free light chain: 2.01 mg/dL — ABNORMAL HIGH (ref 0.33–1.94)

## 2010-02-20 LAB — SPEP & IFE WITH QIG
Albumin ELP: 58.1 % (ref 55.8–66.1)
Alpha-1-Globulin: 4.8 % (ref 2.9–4.9)
Alpha-2-Globulin: 10 % (ref 7.1–11.8)
Beta 2: 3.2 % (ref 3.2–6.5)
Beta Globulin: 6.4 % (ref 4.7–7.2)
Gamma Globulin: 17.5 % (ref 11.1–18.8)
IgA: 135 mg/dL (ref 68–378)
IgG (Immunoglobin G), Serum: 1090 mg/dL (ref 694–1618)
IgM, Serum: 58 mg/dL — ABNORMAL LOW (ref 60–263)
M-Spike, %: 0.72 g/dL
Total Protein, Serum Electrophoresis: 6.3 g/dL (ref 6.0–8.3)

## 2010-02-20 LAB — COMPREHENSIVE METABOLIC PANEL
ALT: 16 U/L (ref 0–53)
AST: 15 U/L (ref 0–37)
Albumin: 3.6 g/dL (ref 3.5–5.2)
Alkaline Phosphatase: 67 U/L (ref 39–117)
Glucose, Bld: 86 mg/dL (ref 70–99)
Potassium: 4 mEq/L (ref 3.5–5.3)
Sodium: 139 mEq/L (ref 135–145)
Total Bilirubin: 1.6 mg/dL — ABNORMAL HIGH (ref 0.3–1.2)
Total Protein: 6.3 g/dL (ref 6.0–8.3)

## 2010-05-16 ENCOUNTER — Ambulatory Visit: Payer: Self-pay | Admitting: Hematology & Oncology

## 2010-05-18 LAB — CBC WITH DIFFERENTIAL (CANCER CENTER ONLY)
BASO%: 1.1 % (ref 0.0–2.0)
EOS%: 8.7 % — ABNORMAL HIGH (ref 0.0–7.0)
MCH: 31.3 pg (ref 28.0–33.4)
MCHC: 33.8 g/dL (ref 32.0–35.9)
MONO%: 13.5 % — ABNORMAL HIGH (ref 0.0–13.0)
NEUT#: 2 10*3/uL (ref 1.5–6.5)
Platelets: 116 10*3/uL — ABNORMAL LOW (ref 145–400)
RBC: 3.63 10*6/uL — ABNORMAL LOW (ref 4.20–5.70)
RDW: 12.3 % (ref 10.5–14.6)

## 2010-05-24 LAB — SPEP & IFE WITH QIG
Beta Globulin: 6.4 % (ref 4.7–7.2)
Gamma Globulin: 17.6 % (ref 11.1–18.8)
IgA: 143 mg/dL (ref 68–378)
IgG (Immunoglobin G), Serum: 1140 mg/dL (ref 694–1618)
M-Spike, %: 0.56 g/dL
Total Protein, Serum Electrophoresis: 6.1 g/dL (ref 6.0–8.3)

## 2010-05-24 LAB — COMPREHENSIVE METABOLIC PANEL
AST: 16 U/L (ref 0–37)
Albumin: 3.8 g/dL (ref 3.5–5.2)
Alkaline Phosphatase: 78 U/L (ref 39–117)
Potassium: 4.1 mEq/L (ref 3.5–5.3)
Sodium: 141 mEq/L (ref 135–145)
Total Bilirubin: 1.5 mg/dL — ABNORMAL HIGH (ref 0.3–1.2)
Total Protein: 6.1 g/dL (ref 6.0–8.3)

## 2010-05-24 LAB — BETA 2 MICROGLOBULIN, SERUM: Beta-2 Microglobulin: 2.53 mg/L — ABNORMAL HIGH (ref 1.01–1.73)

## 2010-05-24 LAB — KAPPA/LAMBDA LIGHT CHAINS: Lambda Free Lght Chn: 3.79 mg/dL — ABNORMAL HIGH (ref 0.57–2.63)

## 2010-08-22 ENCOUNTER — Ambulatory Visit: Payer: Self-pay | Admitting: Hematology & Oncology

## 2010-08-24 LAB — CBC WITH DIFFERENTIAL (CANCER CENTER ONLY)
BASO#: 0 10*3/uL (ref 0.0–0.2)
BASO%: 0.9 % (ref 0.0–2.0)
EOS%: 6.2 % (ref 0.0–7.0)
Eosinophils Absolute: 0.2 10*3/uL (ref 0.0–0.5)
HCT: 36.8 % — ABNORMAL LOW (ref 38.7–49.9)
HGB: 12.7 g/dL — ABNORMAL LOW (ref 13.0–17.1)
LYMPH#: 0.9 10*3/uL (ref 0.9–3.3)
LYMPH%: 26.4 % (ref 14.0–48.0)
MCH: 31.5 pg (ref 28.0–33.4)
MCHC: 34.4 g/dL (ref 32.0–35.9)
MCV: 92 fL (ref 82–98)
MONO#: 0.6 10*3/uL (ref 0.1–0.9)
MONO%: 17.1 % — ABNORMAL HIGH (ref 0.0–13.0)
NEUT#: 1.7 10*3/uL (ref 1.5–6.5)
NEUT%: 49.4 % (ref 40.0–80.0)
Platelets: 178 10*3/uL (ref 145–400)
RBC: 4.02 10*6/uL — ABNORMAL LOW (ref 4.20–5.70)
RDW: 13.5 % (ref 10.5–14.6)
WBC: 3.4 10*3/uL — ABNORMAL LOW (ref 4.0–10.0)

## 2010-08-24 LAB — CHCC SATELLITE - SMEAR

## 2010-08-29 LAB — PROTEIN ELECTROPHORESIS, SERUM
Albumin ELP: 56.4 % (ref 55.8–66.1)
Alpha-1-Globulin: 4.7 % (ref 2.9–4.9)
Alpha-2-Globulin: 10 % (ref 7.1–11.8)
Beta 2: 4.6 % (ref 3.2–6.5)
Beta Globulin: 6.5 % (ref 4.7–7.2)
Gamma Globulin: 17.8 % (ref 11.1–18.8)
M-Spike, %: 0.56 g/dL
Total Protein, Serum Electrophoresis: 6.5 g/dL (ref 6.0–8.3)

## 2010-08-29 LAB — KAPPA/LAMBDA LIGHT CHAINS
Kappa free light chain: 1.68 mg/dL (ref 0.33–1.94)
Kappa:Lambda Ratio: 0.42 (ref 0.26–1.65)
Lambda Free Lght Chn: 3.99 mg/dL — ABNORMAL HIGH (ref 0.57–2.63)

## 2010-08-29 LAB — COMPREHENSIVE METABOLIC PANEL
ALT: 17 U/L (ref 0–53)
AST: 19 U/L (ref 0–37)
Albumin: 4.1 g/dL (ref 3.5–5.2)
Alkaline Phosphatase: 75 U/L (ref 39–117)
BUN: 21 mg/dL (ref 6–23)
CO2: 24 mEq/L (ref 19–32)
Calcium: 9.2 mg/dL (ref 8.4–10.5)
Chloride: 106 mEq/L (ref 96–112)
Creatinine, Ser: 1.28 mg/dL (ref 0.40–1.50)
Glucose, Bld: 86 mg/dL (ref 70–99)
Potassium: 4.4 mEq/L (ref 3.5–5.3)
Sodium: 141 mEq/L (ref 135–145)
Total Bilirubin: 1.5 mg/dL — ABNORMAL HIGH (ref 0.3–1.2)
Total Protein: 6.5 g/dL (ref 6.0–8.3)

## 2010-08-29 LAB — IGG, IGA, IGM
IgA: 166 mg/dL (ref 68–378)
IgG (Immunoglobin G), Serum: 1170 mg/dL (ref 694–1618)
IgM, Serum: 53 mg/dL — ABNORMAL LOW (ref 60–263)

## 2010-08-29 LAB — BETA 2 MICROGLOBULIN, SERUM: Beta-2 Microglobulin: 1.92 mg/L — ABNORMAL HIGH (ref 1.01–1.73)

## 2010-08-29 LAB — LACTATE DEHYDROGENASE: LDH: 159 U/L (ref 94–250)

## 2010-10-28 LAB — CBC
MCHC: 34.1 g/dL (ref 30.0–36.0)
Platelets: 119 10*3/uL — ABNORMAL LOW (ref 150–400)
RDW: 16.1 % — ABNORMAL HIGH (ref 11.5–15.5)

## 2010-10-28 LAB — DIFFERENTIAL
Basophils Absolute: 0 10*3/uL (ref 0.0–0.1)
Basophils Relative: 1 % (ref 0–1)
Lymphocytes Relative: 21 % (ref 12–46)
Neutro Abs: 1.1 10*3/uL — ABNORMAL LOW (ref 1.7–7.7)
Neutrophils Relative %: 42 % — ABNORMAL LOW (ref 43–77)

## 2010-10-28 LAB — BONE MARROW EXAM

## 2010-11-17 LAB — CHROMOSOME ANALYSIS, BONE MARROW

## 2010-11-17 LAB — TISSUE HYBRIDIZATION (BONE MARROW)-NCBH

## 2010-11-27 LAB — POCT HEMOGLOBIN-HEMACUE: Hemoglobin: 10 g/dL — ABNORMAL LOW (ref 13.0–17.0)

## 2010-12-14 ENCOUNTER — Other Ambulatory Visit: Payer: Self-pay | Admitting: Hematology & Oncology

## 2010-12-14 ENCOUNTER — Encounter (HOSPITAL_BASED_OUTPATIENT_CLINIC_OR_DEPARTMENT_OTHER): Payer: Medicare HMO | Admitting: Hematology & Oncology

## 2010-12-14 DIAGNOSIS — C9000 Multiple myeloma not having achieved remission: Secondary | ICD-10-CM

## 2010-12-14 LAB — CMP (CANCER CENTER ONLY)
ALT(SGPT): 19 U/L (ref 10–47)
AST: 25 U/L (ref 11–38)
Albumin: 2.8 g/dL — ABNORMAL LOW (ref 3.3–5.5)
Alkaline Phosphatase: 94 U/L — ABNORMAL HIGH (ref 26–84)
BUN, Bld: 18 mg/dL (ref 7–22)
Potassium: 4.3 mEq/L (ref 3.3–4.7)
Sodium: 142 mEq/L (ref 128–145)
Total Protein: 6.2 g/dL — ABNORMAL LOW (ref 6.4–8.1)

## 2010-12-14 LAB — CBC WITH DIFFERENTIAL (CANCER CENTER ONLY)
BASO#: 0.1 10*3/uL (ref 0.0–0.2)
EOS%: 13.3 % — ABNORMAL HIGH (ref 0.0–7.0)
HGB: 11.9 g/dL — ABNORMAL LOW (ref 13.0–17.1)
MCH: 31.2 pg (ref 28.0–33.4)
MCHC: 36 g/dL — ABNORMAL HIGH (ref 32.0–35.9)
MONO%: 21.9 % — ABNORMAL HIGH (ref 0.0–13.0)
NEUT#: 1.1 10*3/uL — ABNORMAL LOW (ref 1.5–6.5)
NEUT%: 41.5 % (ref 40.0–80.0)

## 2010-12-18 LAB — SPEP & IFE WITH QIG
Albumin ELP: 56.2 % (ref 55.8–66.1)
Alpha-1-Globulin: 5 % — ABNORMAL HIGH (ref 2.9–4.9)
IgM, Serum: 51 mg/dL — ABNORMAL LOW (ref 60–263)

## 2010-12-18 LAB — KAPPA/LAMBDA LIGHT CHAINS: Lambda Free Lght Chn: 5.48 mg/dL — ABNORMAL HIGH (ref 0.57–2.63)

## 2010-12-25 NOTE — Op Note (Signed)
NAME:  Christian Burns, Christian Burns                 ACCOUNT NO.:  [id number]   MEDICAL RECORD NO.:  [id number]          PATIENT TYPE:  AMB   LOCATION:  DSC                          FACILITY:  [facility]   PHYSICIAN:  [name] T. Lazarus Salines, M.D. DATE OF BIRTH:  11-24-1929   DATE OF PROCEDURE:  09/26/2008  DATE OF DISCHARGE:                               OPERATIVE REPORT   PREOPERATIVE DIAGNOSIS:  Papilloma, left inferior meatus.   POSTOPERATIVE DIAGNOSIS:  Papilloma, left inferior meatus.   PROCEDURE PERFORMED:  Endoscopic partial left medial maxillectomy with  removal of neoplasm.  Lacrimal cannulation, left.   SURGEON:  Gloris Manchester. Wolicki, MD   ANESTHESIA:  General orotracheal.   BLOOD LOSS:  25 mL   COMPLICATIONS:  None.   FINDINGS:  A fleshy exophytic mass tucked under the anterior hood of the  left inferior turbinate and extending approximately 2 cm back with  possible involvement through the lateral wall of the nose into the  maxillary sinus.   PROCEDURE:  With the patient in a comfortable supine position, general  orotracheal anesthesia was induced without difficulty.  At an  appropriate level, the patient was placed in a slight sitting position.  A saline moistened throat pack was placed.  Nasal vibrissae were  trimmed.  Afrin solution was applied on 1.5 x 3 inch cottonoids to both  sides of the septum after the patient had received preoperative Afrin  spray.  Several minutes were allowed for this to take effect.  Xylocaine  1% with 1:100,000 epinephrine on a short and 25-gauge spinal needle was  injected into the sub-mucoperichondrial plane to the left septum, into  the anterior floor of the nose on the left side, into the lateral wall  of the nose low on the left side, into the anterior pole of the inferior  turbinate, and finally using the spinal needle along the length of the  inferior turbinate on the left side.  Several additional minutes were  allowed for this to take effect.  A sterile  preparation and draping in  the midface was accomplished in the standard fashion.   The materials were removed from the nose and observed to be intact and  correct in number.  Using direct vision, the 25-gauge spinal needle was  used to infiltrate additional local up into the inferior meatus and more  posteriorly in the inferior meatus.  Several minutes were allowed for  this to take effect.   The 30 degree endoscope was introduced and passed into the inferior  meatus and the lesion was observed.  Under endoscopic vision, a  delimiting incision was made inferiorly using a sickle knife, anteriorly  using a right-angle Beaver blade and then superiorly into the root of  the inferior turbinate with a sickle knife.  A posterior delimiting  incision was attempted and wind up entering the maxillary sinus.  Working with endoscopic vision, from the antrostomy, the inferior  incision through the bone was made with backbiting forceps and finally  with Hajek forceps.  As the bone became quite thick anteriorly, this was  no longer possible.  At the superior extent, the middle meatus was  entered and the inferior portion of the uncinate process was nibbled  clean with a biting forceps.  This allowed access into the maxillary  sinus from above the root of the inferior turbinate.  This was brought  forward.  Again, the bone became thickened and was not possible to  further open this forward.   Working inside the nose but at the piriform aperture, an osteotome was  made to make superior and inferior incisions through the bone of the  piriform aperture and then through the mucosa.  The dissection was  carried into the maxillary sinus mucosa.  At this point, the specimen  was pedicled posteriorly, but had been cleared superiorly, anteriorly  and inferiorly.  Mucosal margins from the maxillary sinus which were  slightly thickened and polypoid were sampled inferiorly and superiorly  and frozen section  returned as negative.   Working posteriorly, the [employer] were used to cut across the  posterior inferior turbinate.  The specimen was delivered at this point  and sent for permanent pathologic interpretation.  Mucosal shards were  removed with the punch forceps as were additional bony chips.  A wide  antrostomy was generated and the bulk of the inferior turbinate was  resected.  The bulbous posterior pole of the inferior turbinate was  suctioned and coagulated for hemostasis.  There was an active arteriolar  bleeding vessel encountered at the piriform aperture which was also  controlled with suction cautery.  At this point, hemostasis was  observed.  It was felt that the lacrimal duct had been cut across in the  course of doing the resection and lacrimal cannulation was felt  indicated.   Using double 0 lacrimal probes, the superior and inferior puncta were  dilated then 0 then 1.  At that point, the lacrimal cannulae probes were  placed superiorly and inferiorly and readily passed into the nose with  the small Silastic tubing between them.  These were brought out the  anterior nose.  They were tied into a knot.  A 3-0 silk stitch was tied  into the not to be able to find it later.  There was cut with a moderate  tag.  The probes were passed off and the cannulae were passed back into  the nose.  Again hemostasis was observed.   An aligned Merocel sponge was shortened and placed deep into the antrum  and a second one was tagged with a 2-0 silk and also placed in the  antrum for hemostasis from the medial to the lateral aspect.  Finally, a  full-thickness full-length Merocel sponge was placed inferiorly in the  nose and all of this was moistened with saline.  The sponges did expand  and hemostasis was observed.  At this point, the procedure was  completed.  The pharynx was suctioned clean and the throat pack was  removed.  The patient was returned to Anesthesia, awakened,  extubated,  and transferred to recovery in stable condition.   COMMENT:  A 77 year old white male with a recent history of nosebleed  discovered to have a mass in the left anterior inferior meatus which was  biopsy positive for a benign papilloma was indication for today's  procedure.  Anticipate routine postoperative recovery with attention to  ice, elevation, analgesia, and antibiosis.  We will remove the nasal and  sinus packing in 2 days.  We will remove the lacrimal cannulae in  roughly 6 weeks.  Gloris Manchester. Lazarus Salines, M.D.  Electronically Signed     KTW/MEDQ  D:  09/26/2008  T:  09/26/2008  Job:  [id number]   cc:   Barry Dienes. [name], M.D.

## 2011-04-19 ENCOUNTER — Other Ambulatory Visit: Payer: Self-pay | Admitting: Hematology & Oncology

## 2011-04-19 ENCOUNTER — Encounter (HOSPITAL_BASED_OUTPATIENT_CLINIC_OR_DEPARTMENT_OTHER): Payer: Medicare HMO | Admitting: Hematology & Oncology

## 2011-04-19 DIAGNOSIS — C9000 Multiple myeloma not having achieved remission: Secondary | ICD-10-CM

## 2011-04-19 DIAGNOSIS — R972 Elevated prostate specific antigen [PSA]: Secondary | ICD-10-CM

## 2011-04-19 LAB — CMP (CANCER CENTER ONLY)
ALT(SGPT): 22 U/L (ref 10–47)
AST: 21 U/L (ref 11–38)
Albumin: 3.1 g/dL — ABNORMAL LOW (ref 3.3–5.5)
Calcium: 8.8 mg/dL (ref 8.0–10.3)
Chloride: 108 mEq/L (ref 98–108)
Potassium: 4.3 mEq/L (ref 3.3–4.7)

## 2011-04-19 LAB — CBC WITH DIFFERENTIAL (CANCER CENTER ONLY)
BASO#: 0.1 10*3/uL (ref 0.0–0.2)
BASO%: 2.6 % — ABNORMAL HIGH (ref 0.0–2.0)
EOS%: 14 % — ABNORMAL HIGH (ref 0.0–7.0)
HGB: 11.9 g/dL — ABNORMAL LOW (ref 13.0–17.1)
LYMPH#: 0.6 10*3/uL — ABNORMAL LOW (ref 0.9–3.3)
MCHC: 36.5 g/dL — ABNORMAL HIGH (ref 32.0–35.9)
NEUT#: 1.5 10*3/uL (ref 1.5–6.5)
RDW: 13.9 % (ref 11.1–15.7)
WBC: 3.1 10*3/uL — ABNORMAL LOW (ref 4.0–10.0)

## 2011-04-23 LAB — SPEP & IFE WITH QIG
Alpha-1-Globulin: 4.5 % (ref 2.9–4.9)
Alpha-2-Globulin: 9.5 % (ref 7.1–11.8)
M-Spike, %: 0.53 g/dL
Total Protein, Serum Electrophoresis: 6.3 g/dL (ref 6.0–8.3)

## 2011-04-23 LAB — LACTATE DEHYDROGENASE: LDH: 144 U/L (ref 94–250)

## 2011-08-16 ENCOUNTER — Other Ambulatory Visit: Payer: Self-pay | Admitting: *Deleted

## 2011-08-16 MED ORDER — LENALIDOMIDE 10 MG PO CAPS: 10.0000 mg | ORAL_CAPSULE | Freq: Every day | ORAL | Status: DC

## 2011-08-23 ENCOUNTER — Ambulatory Visit (HOSPITAL_BASED_OUTPATIENT_CLINIC_OR_DEPARTMENT_OTHER): Payer: Medicare HMO | Admitting: Hematology & Oncology

## 2011-08-23 ENCOUNTER — Other Ambulatory Visit: Payer: Self-pay | Admitting: Hematology & Oncology

## 2011-08-23 ENCOUNTER — Other Ambulatory Visit (HOSPITAL_BASED_OUTPATIENT_CLINIC_OR_DEPARTMENT_OTHER): Payer: Medicare HMO | Admitting: Lab

## 2011-08-23 ENCOUNTER — Ambulatory Visit (HOSPITAL_BASED_OUTPATIENT_CLINIC_OR_DEPARTMENT_OTHER): Payer: Medicare HMO

## 2011-08-23 DIAGNOSIS — G5790 Unspecified mononeuropathy of unspecified lower limb: Secondary | ICD-10-CM

## 2011-08-23 DIAGNOSIS — C9000 Multiple myeloma not having achieved remission: Secondary | ICD-10-CM

## 2011-08-23 LAB — CBC WITH DIFFERENTIAL (CANCER CENTER ONLY)
BASO#: 0.1 10*3/uL (ref 0.0–0.2)
Eosinophils Absolute: 0.3 10*3/uL (ref 0.0–0.5)
HGB: 12.7 g/dL — ABNORMAL LOW (ref 13.0–17.1)
LYMPH#: 0.5 10*3/uL — ABNORMAL LOW (ref 0.9–3.3)
MCH: 32.3 pg (ref 28.0–33.4)
MONO%: 22 % — ABNORMAL HIGH (ref 0.0–13.0)
NEUT#: 1.7 10*3/uL (ref 1.5–6.5)
RBC: 3.93 10*6/uL — ABNORMAL LOW (ref 4.20–5.70)

## 2011-08-23 MED ORDER — HEPARIN SOD (PORK) LOCK FLUSH 100 UNIT/ML IV SOLN
250.0000 [IU] | Freq: Once | INTRAVENOUS | Status: DC | PRN
  Filled 2011-08-23: qty 5

## 2011-08-23 MED ORDER — ZOLEDRONIC ACID 4 MG/5ML IV CONC
3.3000 mg | Freq: Once | INTRAVENOUS | Status: AC
  Administered 2011-08-23: 3.3 mg via INTRAVENOUS
  Filled 2011-08-23: qty 4.13

## 2011-08-23 MED ORDER — SODIUM CHLORIDE 0.9 % IV SOLN
Freq: Once | INTRAVENOUS | Status: AC
  Administered 2011-08-23: 12:00:00 via INTRAVENOUS

## 2011-08-23 NOTE — Progress Notes (Signed)
This office note has been dictated.

## 2011-08-24 NOTE — Progress Notes (Signed)
CC:   Barry Dienes. [name], M.D.  DIAGNOSIS:  IgG lambda myeloma.  CURRENT THERAPY: 1. Revlimid 10 mg p.o. daily (21 days on/7 days off). 2. Zometa 3.3 mg IV q.4 months.  INTERIM HISTORY:  Mr. Stovall comes in for his followup.  He is doing okay. He had no problems over the holidays.  Unfortunately, his wife is not doing too well.  She has bad degenerative joint disease in her knees. She needs knee surgery, but will not have this done.  When we last saw him, his M-spike was 0.5 mg/dL.  His IgG level was 1130 mg/dL.  His serum lambda light chain was 6.09 mg/dL.  These are all holding very stable.  We have been watching his PSA.  When we last saw him, his PSA was 12. He sees, I think, Dr. Retta Diones over at [facility].  He has had no fever.  He has had no rashes.  He has had no weight loss or weight gain.  PHYSICAL EXAMINATION:  General:  This is an elderly white gentleman in no obvious distress.  Vital Signs:  Temperature 97.1, pulse 64, heart rate 18, blood pressure 143/84, weight is 159.  Head and Neck Exam: Shows a normocephalic, atraumatic skull.  There are no ocular or oral lesions.  There are no palpable cervical or supraclavicular lymph nodes. Lungs:  Clear bilaterally.  Cardiac Exam:  Regular rate and rhythm with a normal S1 and S2.  There are no murmurs, rubs, or bruits.  Abdominal Exam:  Soft with good bowel sounds.  There is no palpable abdominal mass.  There is no fluid wave.  There is no palpable hepatosplenomegaly. Back Exam:  No tenderness over the spine, ribs, or hips.  Extremities: Show no clubbing, cyanosis, or edema.  Neurological Exam:  Shows no focal neurological deficits.  LABORATORY STUDIES:  White cell count is 3.4, hemoglobin 12.7, hematocrit 35.7, platelet count 153.  IMPRESSION:  Mr. Belue is a 78 year old gentleman with IgG lambda myeloma.  He has really done well with this.  We have him on Revlimid as a "maintenance" therapy.  We will recheck his  myeloma panel today.  I forgot to mention that he does have some neuropathy in his feet.  This is holding steady.  I told him to try taking 2000 mcg of vitamin B12.  I do not see a problem with him taking B12.  We will go ahead and give him his Zometa today.  I want to see him back in about 3 months' time for a followup visit and his next Zometa dose.    ______________________________ Josph Macho, M.D. PRE/MEDQ  D:  08/23/2011  T:  08/24/2011  Job:  [id number]

## 2011-09-11 ENCOUNTER — Other Ambulatory Visit: Payer: Self-pay | Admitting: *Deleted

## 2011-09-11 MED ORDER — LENALIDOMIDE 10 MG PO CAPS: 10.0000 mg | ORAL_CAPSULE | Freq: Every day | ORAL | Status: DC

## 2011-09-11 NOTE — Telephone Encounter (Signed)
Received a refill request for Revlimid from [facility]. Obtained authorization # from [employer]. Will route rx to Dr Myna Hidalgo to sign then will fax back to them at [phone].

## 2011-10-09 ENCOUNTER — Other Ambulatory Visit: Payer: Self-pay | Admitting: *Deleted

## 2011-10-09 MED ORDER — LENALIDOMIDE 10 MG PO CAPS: 10.0000 mg | ORAL_CAPSULE | Freq: Every day | ORAL | Status: DC

## 2011-11-04 ENCOUNTER — Other Ambulatory Visit: Payer: Self-pay | Admitting: *Deleted

## 2011-11-04 MED ORDER — LENALIDOMIDE 10 MG PO CAPS: 10.0000 mg | ORAL_CAPSULE | Freq: Every day | ORAL | Status: DC

## 2011-11-04 NOTE — Telephone Encounter (Signed)
Received refill request for Revlimid 10 mg from [facility]. Obtained auth # [id number] from [employer] & faxed to [phone].

## 2011-11-15 ENCOUNTER — Other Ambulatory Visit (HOSPITAL_BASED_OUTPATIENT_CLINIC_OR_DEPARTMENT_OTHER): Payer: Medicare HMO | Admitting: Lab

## 2011-11-15 ENCOUNTER — Ambulatory Visit (HOSPITAL_BASED_OUTPATIENT_CLINIC_OR_DEPARTMENT_OTHER): Payer: Medicare HMO

## 2011-11-15 ENCOUNTER — Ambulatory Visit: Payer: Medicare HMO

## 2011-11-15 ENCOUNTER — Ambulatory Visit: Payer: Medicare HMO | Admitting: Hematology & Oncology

## 2011-11-15 ENCOUNTER — Other Ambulatory Visit: Payer: Medicare HMO | Admitting: Lab

## 2011-11-15 ENCOUNTER — Ambulatory Visit (HOSPITAL_BASED_OUTPATIENT_CLINIC_OR_DEPARTMENT_OTHER): Payer: Medicare HMO | Admitting: Hematology & Oncology

## 2011-11-15 VITALS — BP 156/72 | HR 69 | Temp 96.9°F | Ht 72.0 in | Wt 159.0 lb

## 2011-11-15 DIAGNOSIS — C9000 Multiple myeloma not having achieved remission: Secondary | ICD-10-CM

## 2011-11-15 LAB — CBC WITH DIFFERENTIAL (CANCER CENTER ONLY)
BASO#: 0.1 10*3/uL (ref 0.0–0.2)
EOS%: 11.1 % — ABNORMAL HIGH (ref 0.0–7.0)
Eosinophils Absolute: 0.4 10*3/uL (ref 0.0–0.5)
HCT: 34.9 % — ABNORMAL LOW (ref 38.7–49.9)
HGB: 12.6 g/dL — ABNORMAL LOW (ref 13.0–17.1)
MCH: 32.5 pg (ref 28.0–33.4)
MCHC: 36.1 g/dL — ABNORMAL HIGH (ref 32.0–35.9)
MONO%: 19.4 % — ABNORMAL HIGH (ref 0.0–13.0)
NEUT%: 51 % (ref 40.0–80.0)

## 2011-11-15 MED ORDER — SODIUM CHLORIDE 0.9 % IV SOLN
3.3000 mg | Freq: Once | INTRAVENOUS | Status: AC
  Administered 2011-11-15: 3.3 mg via INTRAVENOUS
  Filled 2011-11-15: qty 4.13

## 2011-11-15 MED ORDER — SODIUM CHLORIDE 0.9 % IV SOLN
Freq: Once | INTRAVENOUS | Status: AC
  Administered 2011-11-15: 11:00:00 via INTRAVENOUS

## 2011-11-15 NOTE — Progress Notes (Signed)
CC:   Barry Dienes. [name], M.D.  DIAGNOSIS:  IgG lambda myeloma.  CURRENT THERAPY: 1. Revlimid 10 mg p.o. daily (21 days on/7 days off). 2. Zometa 3.3 mg IV q.4 months.  INTERIM HISTORY:  [name] comes in for his followup.  He has continued to do okay.  His wife is doing a little bit better.  She has the knee issues.  I do not think she has had knee surgery yet, but it seems like her pain is doing better.  We see [name] every 4 months.  When we last saw him, his myeloma studies seemed to be holding their own.  He has had some neuropathy in his feet and lower legs.  This may be from the Revlimid.  He says it is not getting worse.  I recommended some vitamin B complex to try to help with this neuropathy.  He has had no issues going to the bathroom.  He does have an elevated PSA.  This is being followed by Dr. Retta Diones.  He has had no problems with bleeding.  He has had no fever.  He has had no rashes.  He has had bilateral dysphagia or odynophagia.  PHYSICAL EXAMINATION:  This is a elderly, thin white gentleman in no obvious distress.  Vital signs:  A temperature of 96.9, pulse 69, respiratory rate 18, blood pressure 156/72.  Weight is 159.  Head and neck:  Shows a normocephalic, atraumatic skull.  There are no ocular or oral lesions.  There are no palpable cervical or supraclavicular lymph nodes.  Lungs:  Clear bilaterally.  Cardiac:  Regular rate and rhythm with a normal S1 and S2.  There are no murmurs, rubs or bruits. Abdomen:  Soft with good bowel sounds.  There is no fluid wave.  There is no palpable hepatosplenomegaly.  Extremities:  Show no clubbing, cyanosis or edema.  He does have some arthritic changes in his joints. Back:  No tenderness of the spine, ribs, or hips.  LABORATORY STUDIES:  White cell count 3.3, hemoglobin 12.6, hematocrit 34.9, platelet count 148.  Impression.  Mr. Anger is a 78 year old gentleman with IgG lambda myeloma.  He has done very well  with the Revlimid.  His myeloma levels are still quite low.  We are rechecking them today.  We will go ahead and give him his Zometa today.  We will plan to have him come back in another 4 months.    ______________________________ Josph Macho, M.D. PRE/MEDQ  D:  11/15/2011  T:  11/15/2011  Job:  [id number]  ADDENDUM:  M-Spike is 0.39 g/dL.  IgG is 1140 mg/dL.  LAMBDA is 3.6 mg/dL

## 2011-11-19 LAB — PROTEIN ELECTROPHORESIS, SERUM, WITH REFLEX
Alpha-1-Globulin: 4.4 % (ref 2.9–4.9)
Alpha-2-Globulin: 9.7 % (ref 7.1–11.8)
Beta 2: 4.4 % (ref 3.2–6.5)
Beta Globulin: 5.9 % (ref 4.7–7.2)
Gamma Globulin: 18.1 % (ref 11.1–18.8)
M-Spike, %: 0.39 g/dL

## 2011-11-19 LAB — KAPPA/LAMBDA LIGHT CHAINS: Kappa:Lambda Ratio: 0.53 (ref 0.26–1.65)

## 2011-11-19 LAB — COMPREHENSIVE METABOLIC PANEL
AST: 21 U/L (ref 0–37)
BUN: 18 mg/dL (ref 6–23)
Calcium: 8.4 mg/dL (ref 8.4–10.5)
Chloride: 108 mEq/L (ref 96–112)
Creatinine, Ser: 1.32 mg/dL (ref 0.50–1.35)
Glucose, Bld: 109 mg/dL — ABNORMAL HIGH (ref 70–99)

## 2011-11-19 LAB — IGG, IGA, IGM
IgA: 227 mg/dL (ref 68–379)
IgG (Immunoglobin G), Serum: 1140 mg/dL (ref 650–1600)
IgM, Serum: 53 mg/dL (ref 41–251)

## 2011-11-19 LAB — IFE INTERPRETATION

## 2011-11-20 ENCOUNTER — Telehealth: Payer: Self-pay | Admitting: Hematology & Oncology

## 2011-11-20 ENCOUNTER — Other Ambulatory Visit: Payer: Self-pay | Admitting: Hematology & Oncology

## 2011-11-20 DIAGNOSIS — C9000 Multiple myeloma not having achieved remission: Secondary | ICD-10-CM

## 2011-11-20 NOTE — Telephone Encounter (Signed)
Left pt message with 03-20-12 appointment time

## 2011-11-21 ENCOUNTER — Telehealth: Payer: Self-pay | Admitting: *Deleted

## 2011-11-21 NOTE — Telephone Encounter (Signed)
Called patient to let him know that his myeloma levels were even lower per dr. Myna Hidalgo

## 2011-11-21 NOTE — Telephone Encounter (Signed)
Message copied by Anselm Jungling on Thu Nov 21, 2011  5:00 PM ------      Message from: Arlan Organ R      Created: Wed Nov 20, 2011  9:37 PM       Call -  Myeloma is even lower!!!  pete

## 2011-12-06 ENCOUNTER — Other Ambulatory Visit: Payer: Self-pay | Admitting: *Deleted

## 2011-12-06 MED ORDER — LENALIDOMIDE 10 MG PO CAPS: 10.0000 mg | ORAL_CAPSULE | Freq: Every day | ORAL | Status: DC

## 2011-12-06 NOTE — Telephone Encounter (Signed)
Pt called requesting a Revlimid rx be faxed to [facility]. He took his last pill on Monday. Reviewed last note from Dr Myna Hidalgo. To maintain on 10 mg per day 21 days on 7 days off. Rx signed by Dr Myna Hidalgo then faxed to [phone]. Pt made aware of need for pt survey.

## 2012-01-01 ENCOUNTER — Other Ambulatory Visit: Payer: Self-pay | Admitting: *Deleted

## 2012-01-01 MED ORDER — LENALIDOMIDE 10 MG PO CAPS: 10.0000 mg | ORAL_CAPSULE | Freq: Every day | ORAL | Status: DC

## 2012-01-01 NOTE — Telephone Encounter (Signed)
Received a refill request from [facility]. Reviewed last note from Dr Myna Hidalgo. To maintain on 10 mg per day 21 days on 7 days off. Rx signed by Dr Myna Hidalgo then faxed to [phone].

## 2012-01-29 ENCOUNTER — Other Ambulatory Visit: Payer: Self-pay | Admitting: *Deleted

## 2012-01-29 MED ORDER — LENALIDOMIDE 10 MG PO CAPS: 10.0000 mg | ORAL_CAPSULE | Freq: Every day | ORAL | Status: DC

## 2012-01-29 NOTE — Telephone Encounter (Signed)
Received a refill request from [facility]. Obtained auth # [id number]. Reviewed last note from Dr Myna Hidalgo. To maintain on 10 mg per day 21 days on 7 days off. Rx signed by Dr Myna Hidalgo then faxed to [phone].

## 2012-02-21 ENCOUNTER — Other Ambulatory Visit: Payer: Self-pay | Admitting: *Deleted

## 2012-02-21 MED ORDER — LENALIDOMIDE 10 MG PO CAPS: 10.0000 mg | ORAL_CAPSULE | Freq: Every day | ORAL | Status: DC

## 2012-02-21 NOTE — Telephone Encounter (Signed)
Received a refill request from [facility]. Obtained auth # [id number]. Reviewed last note from Dr Myna Hidalgo. To maintain on 10 mg per day 21 days on 7 days off. Due for f/u next month. Rx signed by Dr Myna Hidalgo then faxed to [phone].

## 2012-02-27 ENCOUNTER — Encounter: Payer: Self-pay | Admitting: *Deleted

## 2012-02-27 NOTE — Progress Notes (Signed)
Received delivery notification from [facility] that patients Revlimid was shipped on July 17 to patient

## 2012-03-18 ENCOUNTER — Ambulatory Visit: Payer: Medicare HMO | Admitting: Hematology & Oncology

## 2012-03-18 ENCOUNTER — Ambulatory Visit: Payer: Medicare HMO

## 2012-03-18 ENCOUNTER — Other Ambulatory Visit: Payer: Medicare HMO | Admitting: Lab

## 2012-03-19 ENCOUNTER — Other Ambulatory Visit: Payer: Self-pay | Admitting: *Deleted

## 2012-03-20 ENCOUNTER — Ambulatory Visit: Payer: Medicare HMO

## 2012-03-20 ENCOUNTER — Other Ambulatory Visit: Payer: Self-pay | Admitting: *Deleted

## 2012-03-20 ENCOUNTER — Ambulatory Visit (HOSPITAL_BASED_OUTPATIENT_CLINIC_OR_DEPARTMENT_OTHER): Payer: Medicare HMO | Admitting: Medical

## 2012-03-20 ENCOUNTER — Ambulatory Visit (HOSPITAL_BASED_OUTPATIENT_CLINIC_OR_DEPARTMENT_OTHER): Payer: Medicare HMO | Admitting: Lab

## 2012-03-20 VITALS — BP 124/69 | HR 61 | Temp 97.5°F | Resp 20 | Ht 72.0 in | Wt 156.0 lb

## 2012-03-20 DIAGNOSIS — D696 Thrombocytopenia, unspecified: Secondary | ICD-10-CM

## 2012-03-20 DIAGNOSIS — C9000 Multiple myeloma not having achieved remission: Secondary | ICD-10-CM

## 2012-03-20 LAB — CBC WITH DIFFERENTIAL (CANCER CENTER ONLY)
BASO%: 2.7 % — ABNORMAL HIGH (ref 0.0–2.0)
EOS%: 13.4 % — ABNORMAL HIGH (ref 0.0–7.0)
HCT: 33.9 % — ABNORMAL LOW (ref 38.7–49.9)
LYMPH#: 0.5 10*3/uL — ABNORMAL LOW (ref 0.9–3.3)
MCHC: 36.3 g/dL — ABNORMAL HIGH (ref 32.0–35.9)
NEUT#: 1.8 10*3/uL (ref 1.5–6.5)
NEUT%: 53.5 % (ref 40.0–80.0)
Platelets: 106 10*3/uL — ABNORMAL LOW (ref 145–400)
RDW: 13.9 % (ref 11.1–15.7)

## 2012-03-20 MED ORDER — SODIUM CHLORIDE 0.9 % IV SOLN
Freq: Once | INTRAVENOUS | Status: AC
  Administered 2012-03-20: 11:00:00 via INTRAVENOUS

## 2012-03-20 MED ORDER — SODIUM CHLORIDE 0.9 % IJ SOLN
3.0000 mL | Freq: Once | INTRAMUSCULAR | Status: DC | PRN
  Filled 2012-03-20: qty 10

## 2012-03-20 MED ORDER — ZOLEDRONIC ACID 4 MG/5ML IV CONC
3.3000 mg | Freq: Once | INTRAVENOUS | Status: AC
  Administered 2012-03-20: 3.3 mg via INTRAVENOUS
  Filled 2012-03-20: qty 4.13

## 2012-03-20 MED ORDER — ALTEPLASE 2 MG IJ SOLR
2.0000 mg | Freq: Once | INTRAMUSCULAR | Status: DC | PRN
  Filled 2012-03-20: qty 2

## 2012-03-20 MED ORDER — LENALIDOMIDE 10 MG PO CAPS: 10.0000 mg | ORAL_CAPSULE | Freq: Every day | ORAL | Status: DC

## 2012-03-20 MED ORDER — HEPARIN SOD (PORK) LOCK FLUSH 100 UNIT/ML IV SOLN
250.0000 [IU] | Freq: Once | INTRAVENOUS | Status: DC | PRN
  Filled 2012-03-20: qty 5

## 2012-03-20 MED ORDER — GABAPENTIN 300 MG PO CAPS: 300.0000 mg | ORAL_CAPSULE | Freq: Three times a day (TID) | ORAL | Status: DC

## 2012-03-20 MED ORDER — HEPARIN SOD (PORK) LOCK FLUSH 100 UNIT/ML IV SOLN
500.0000 [IU] | Freq: Once | INTRAVENOUS | Status: DC | PRN
  Filled 2012-03-20: qty 5

## 2012-03-20 MED ORDER — SODIUM CHLORIDE 0.9 % IJ SOLN
10.0000 mL | INTRAMUSCULAR | Status: DC | PRN
  Filled 2012-03-20: qty 10

## 2012-03-20 NOTE — Telephone Encounter (Signed)
Received a refill request from [facility]. Obtained auth # [id number]. Pt saw Eunice Blase, PA today. To maintain on 10 mg per day 21 days on 7 days off. Faxed rx back to [phone].

## 2012-03-20 NOTE — Progress Notes (Signed)
Patient Name : Christian Burns, Christian Burns MR # [id number] DOB: 04/19/1930 Encounter Date: 03/20/2012 Dictated by Christian Blase, Christian Burns  Diagnosis: IgG lambda myeloma.  Current therapy: #1 Revlimid 10 mg by mouth daily (21 days on 7 days off). #2 Zometa 3.3 mg IV every 4 months.  Interim history: [name] comes in today for an office followup visit.  Overall, he is continued to do quite well.  He continues to report some lower extremity neuropathy.  He is on B6.  I did speak with him about trying some Neurontin and he agrees.  His last protein studies back in April revealed an IgG of 1140, kappa free light chain 1.8, 9 mg/dL , and an M spike of 0.3, 9 g/dL.  Marland Kitchen  Overall, he is holding his own.  He denies any bowel or bladder problems.  He does have an elevated PSA, which is being followed by Christian Burns.  He denies any obvious, bleeding.  He denies any fevers, chills, or night sweats.  He denies any rashes.  He denies any headaches, visual changes, chest pain, shortness of breath, or cough.  He does receive Zometa 3.3 mg IV every 4 months.  Review of Systems: Pt. Denies any changes in their vision, hearing, adenopathy, fevers, chills, nausea, vomiting, diarrhea, constipation, chest pain, shortness of breath, passing blood, passing out, blacking out,  any changes in skin, joints, neurologic or psychiatric except as noted.  Physical Exam: This is a pleasant, 78 year old, gentleman, in no obvious distress Vitals: Temperature 97.5 degrees, pulse 61, respirations 20, blood pressure 124/69, weight 156 pounds HEENT reveals a normocephalic, atraumatic skull, no scleral icterus, no oral lesions  Neck is supple without any cervical or supraclavicular adenopathy.  Lungs are clear to auscultation bilaterally. There are no wheezes, rales or rhonci Cardiac is regular rate and rhythm with a normal S1 and S2. There are no murmurs, rubs, or bruits.  Abdomen is soft with good bowel sounds, there is no palpable mass. There is  no palpable hepatosplenomegaly. There is no palpable fluid wave.  Musculoskeletal no tenderness of the spine, ribs, or hips.  Extremities there are no clubbing, cyanosis, or edema.  Skin no petechia, purpura or ecchymosis Neurologic is nonfocal.  Laboratory Data: White count 3.3, hemoglobin 12.3, hematocrit 33.9, platelets 106,000 Protein studies from today are currently pending  Current Outpatient Prescriptions on File Prior to Visit  Medication Sig Dispense Refill  . aspirin 325 MG tablet Take 325 mg by mouth daily.      . calcium carbonate 200 MG capsule Take 250 mg by mouth daily.      . cholecalciferol (VITAMIN D) 1000 UNITS tablet Take 50,000 Units by mouth every 30 (thirty) days.       . Multiple Vitamins-Minerals (MULTIVITAL PO) Take by mouth every morning.      . pyridOXINE (VITAMIN B-6) 100 MG tablet Take 100 mg by mouth daily.      Marland Kitchen DISCONTD: lenalidomide (REVLIMID) 10 MG capsule Take 1 capsule (10 mg total) by mouth daily. Take 1 capsule po daily for 21 days then 7 days off. Auth # [id number]  21 capsule  0  . gabapentin (NEURONTIN) 300 MG capsule Take 1 capsule (300 mg total) by mouth 3 (three) times daily.  90 capsule  0   No current facility-administered medications on file prior to visit.   Assessment/Plan: This is a pleasant, 77 year old, gentleman, with the following issues. #1 IgG lambda myeloma-he has done very well with the Revlimid.  His myeloma.  Levels are still quite low.  We will recheck them today.  He will go ahead and receive his Zometa today.   #2-thrombocytopenia-we will monitor this closely.  He has no obvious, bleeding.  #3 followup-we will see [name].  Back in 4 months, but before then should there be questions or concerns.

## 2012-03-20 NOTE — Patient Instructions (Addendum)
Zoledronic Acid injection (Hypercalcemia, Oncology) What is this medicine? ZOLEDRONIC ACID (ZOE le dron ik AS id) lowers the amount of calcium loss from bone. It is used to treat too much calcium in your blood from cancer. It is also used to prevent complications of cancer that has spread to the bone. This medicine may be used for other purposes; ask your health care provider or pharmacist if you have questions. What should I tell my health care provider before I take this medicine? They need to know if you have any of these conditions: -aspirin-sensitive asthma -dental disease -kidney disease -an unusual or allergic reaction to zoledronic acid, other medicines, foods, dyes, or preservatives -pregnant or trying to get pregnant -breast-feeding How should I use this medicine? This medicine is for infusion into a vein. It is given by a health care professional in a hospital or clinic setting. Talk to your pediatrician regarding the use of this medicine in children. Special care may be needed. Overdosage: If you think you have taken too much of this medicine contact a poison control center or emergency room at once. NOTE: This medicine is only for you. Do not share this medicine with others. What if I miss a dose? It is important not to miss your dose. Call your doctor or health care professional if you are unable to keep an appointment. What may interact with this medicine? -certain antibiotics given by injection -NSAIDs, medicines for pain and inflammation, like ibuprofen or naproxen -some diuretics like bumetanide, furosemide -teriparatide -thalidomide This list may not describe all possible interactions. Give your health care provider a list of all the medicines, herbs, non-prescription drugs, or dietary supplements you use. Also tell them if you smoke, drink alcohol, or use illegal drugs. Some items may interact with your medicine. What should I watch for while using this medicine? Visit  your doctor or health care professional for regular checkups. It may be some time before you see the benefit from this medicine. Do not stop taking your medicine unless your doctor tells you to. Your doctor may order blood tests or other tests to see how you are doing. Women should inform their doctor if they wish to become pregnant or think they might be pregnant. There is a potential for serious side effects to an unborn child. Talk to your health care professional or pharmacist for more information. You should make sure that you get enough calcium and vitamin D while you are taking this medicine. Discuss the foods you eat and the vitamins you take with your health care professional. Some people who take this medicine have severe bone, joint, and/or muscle pain. This medicine may also increase your risk for a broken thigh bone. Tell your doctor right away if you have pain in your upper leg or groin. Tell your doctor if you have any pain that does not go away or that gets worse. What side effects may I notice from receiving this medicine? Side effects that you should report to your doctor or health care professional as soon as possible: -allergic reactions like skin rash, itching or hives, swelling of the face, lips, or tongue -anxiety, confusion, or depression -breathing problems -changes in vision -feeling faint or lightheaded, falls -jaw burning, cramping, pain -muscle cramps, stiffness, or weakness -trouble passing urine or change in the amount of urine Side effects that usually do not require medical attention (report to your doctor or health care professional if they continue or are bothersome): -bone, joint, or muscle pain -  fever -hair loss -irritation at site where injected -loss of appetite -nausea, vomiting -stomach upset -tired This list may not describe all possible side effects. Call your doctor for medical advice about side effects. You may report side effects to FDA at  1-800-FDA-1088. Where should I keep my medicine? This drug is given in a hospital or clinic and will not be stored at home. NOTE: This sheet is a summary. It may not cover all possible information. If you have questions about this medicine, talk to your doctor, pharmacist, or health care provider.  2012, [employer] Standard. (01/25/2011 9:06:58 AM)Ferumoxytol injection What is this medicine? FERUMOXYTOL is an iron complex. Iron is used to make healthy red blood cells, which carry oxygen and nutrients throughout the body. This medicine is used to treat iron deficiency anemia in people with chronic kidney disease. This medicine may be used for other purposes; ask your health care provider or pharmacist if you have questions. What should I tell my health care provider before I take this medicine? They need to know if you have any of these conditions: -anemia not caused by low iron levels -high levels of iron in the blood -magnetic resonance imaging (MRI) test scheduled -an unusual or allergic reaction to iron, other medicines, foods, dyes, or preservatives -pregnant or trying to get pregnant -breast-feeding How should I use this medicine? This medicine is for infusion into a vein. It is given by a health care professional in a hospital or clinic setting. Talk to your pediatrician regarding the use of this medicine in children. Special care may be needed. Overdosage: If you think you've taken too much of this medicine contact a poison control center or emergency room at once. Overdosage: If you think you have taken too much of this medicine contact a poison control center or emergency room at once. NOTE: This medicine is only for you. Do not share this medicine with others. What if I miss a dose? It is important not to miss your dose. Call your doctor or health care professional if you are unable to keep an appointment. What may interact with this medicine? This medicine may interact with the  following medications: -other iron products This list may not describe all possible interactions. Give your health care provider a list of all the medicines, herbs, non-prescription drugs, or dietary supplements you use. Also tell them if you smoke, drink alcohol, or use illegal drugs. Some items may interact with your medicine. What should I watch for while using this medicine? Visit your doctor or healthcare professional regularly. Tell your doctor or healthcare professional if your symptoms do not start to get better or if they get worse. You may need blood work done while you are taking this medicine. You may need to follow a special diet. Talk to your doctor. Foods that contain iron include: whole grains/cereals, dried fruits, beans, or peas, leafy green vegetables, and organ meats (liver, kidney). What side effects may I notice from receiving this medicine? Side effects that you should report to your doctor or health care professional as soon as possible: -allergic reactions like skin rash, itching or hives, swelling of the face, lips, or tongue -breathing problems -changes in blood pressure -feeling faint or lightheaded, falls -fever or chills -flushing, sweating, or hot feelings -swelling of the ankles or feet Side effects that usually do not require medical attention (Report these to your doctor or health care professional if they continue or are bothersome.): -diarrhea -headache -nausea, vomiting -stomach pain This  list may not describe all possible side effects. Call your doctor for medical advice about side effects. You may report side effects to FDA at 1-800-FDA-1088. Where should I keep my medicine? This drug is given in a hospital or clinic and will not be stored at home. NOTE: This sheet is a summary. It may not cover all possible information. If you have questions about this medicine, talk to your doctor, pharmacist, or health care provider.  2012, Elsevier/Gold Standard.  (04/20/2008 9:48:25 PM)

## 2012-03-20 NOTE — Patient Instructions (Addendum)
Zoledronic Acid injection (Hypercalcemia, Oncology) What is this medicine? ZOLEDRONIC ACID (ZOE le dron ik AS id) lowers the amount of calcium loss from bone. It is used to treat too much calcium in your blood from cancer. It is also used to prevent complications of cancer that has spread to the bone. This medicine may be used for other purposes; ask your health care provider or pharmacist if you have questions. What should I tell my health care provider before I take this medicine? They need to know if you have any of these conditions: -aspirin-sensitive asthma -dental disease -kidney disease -an unusual or allergic reaction to zoledronic acid, other medicines, foods, dyes, or preservatives -pregnant or trying to get pregnant -breast-feeding How should I use this medicine? This medicine is for infusion into a vein. It is given by a health care professional in a hospital or clinic setting. Talk to your pediatrician regarding the use of this medicine in children. Special care may be needed. Overdosage: If you think you have taken too much of this medicine contact a poison control center or emergency room at once. NOTE: This medicine is only for you. Do not share this medicine with others. What if I miss a dose? It is important not to miss your dose. Call your doctor or health care professional if you are unable to keep an appointment. What may interact with this medicine? -certain antibiotics given by injection -NSAIDs, medicines for pain and inflammation, like ibuprofen or naproxen -some diuretics like bumetanide, furosemide -teriparatide -thalidomide This list may not describe all possible interactions. Give your health care provider a list of all the medicines, herbs, non-prescription drugs, or dietary supplements you use. Also tell them if you smoke, drink alcohol, or use illegal drugs. Some items may interact with your medicine. What should I watch for while using this medicine? Visit  your doctor or health care professional for regular checkups. It may be some time before you see the benefit from this medicine. Do not stop taking your medicine unless your doctor tells you to. Your doctor may order blood tests or other tests to see how you are doing. Women should inform their doctor if they wish to become pregnant or think they might be pregnant. There is a potential for serious side effects to an unborn child. Talk to your health care professional or pharmacist for more information. You should make sure that you get enough calcium and vitamin D while you are taking this medicine. Discuss the foods you eat and the vitamins you take with your health care professional. Some people who take this medicine have severe bone, joint, and/or muscle pain. This medicine may also increase your risk for a broken thigh bone. Tell your doctor right away if you have pain in your upper leg or groin. Tell your doctor if you have any pain that does not go away or that gets worse. What side effects may I notice from receiving this medicine? Side effects that you should report to your doctor or health care professional as soon as possible: -allergic reactions like skin rash, itching or hives, swelling of the face, lips, or tongue -anxiety, confusion, or depression -breathing problems -changes in vision -feeling faint or lightheaded, falls -jaw burning, cramping, pain -muscle cramps, stiffness, or weakness -trouble passing urine or change in the amount of urine Side effects that usually do not require medical attention (report to your doctor or health care professional if they continue or are bothersome): -bone, joint, or muscle pain -  fever -hair loss -irritation at site where injected -loss of appetite -nausea, vomiting -stomach upset -tired This list may not describe all possible side effects. Call your doctor for medical advice about side effects. You may report side effects to FDA at  1-800-FDA-1088. Where should I keep my medicine? This drug is given in a hospital or clinic and will not be stored at home. NOTE: This sheet is a summary. It may not cover all possible information. If you have questions about this medicine, talk to your doctor, pharmacist, or health care provider.  2012, [employer]vier/Gold Standard. (01/25/2011 9:06:58 AM) 

## 2012-03-24 LAB — COMPREHENSIVE METABOLIC PANEL
ALT: 25 U/L (ref 0–53)
AST: 21 U/L (ref 0–37)
Calcium: 8.6 mg/dL (ref 8.4–10.5)
Chloride: 108 mEq/L (ref 96–112)
Creatinine, Ser: 1.49 mg/dL — ABNORMAL HIGH (ref 0.50–1.35)
Total Bilirubin: 2 mg/dL — ABNORMAL HIGH (ref 0.3–1.2)

## 2012-03-24 LAB — IFE INTERPRETATION

## 2012-03-24 LAB — PROTEIN ELECTROPHORESIS, SERUM, WITH REFLEX
Alpha-2-Globulin: 9.4 % (ref 7.1–11.8)
Beta 2: 5.1 % (ref 3.2–6.5)
Beta Globulin: 6 % (ref 4.7–7.2)
Gamma Globulin: 19 % — ABNORMAL HIGH (ref 11.1–18.8)
M-Spike, %: 0.41 g/dL

## 2012-03-24 LAB — IGG, IGA, IGM: IgM, Serum: 50 mg/dL (ref 41–251)

## 2012-03-25 ENCOUNTER — Encounter: Payer: Self-pay | Admitting: *Deleted

## 2012-03-25 ENCOUNTER — Telehealth: Payer: Self-pay | Admitting: *Deleted

## 2012-03-25 NOTE — Telephone Encounter (Signed)
Called patient to let him know that his myeloma levels were low and stable.  Mailed patient a copy per his request

## 2012-03-25 NOTE — Telephone Encounter (Signed)
Message copied by Anselm Jungling on Wed Mar 25, 2012 12:29 PM ------      Message from: Arlan Organ R      Created: Tue Mar 24, 2012  9:10 PM       Cal - myeloma is still very low and stable!!!!  pete

## 2012-04-17 ENCOUNTER — Other Ambulatory Visit: Payer: Self-pay | Admitting: *Deleted

## 2012-04-17 MED ORDER — LENALIDOMIDE 10 MG PO CAPS: 10.0000 mg | ORAL_CAPSULE | Freq: Every day | ORAL | Status: DC

## 2012-05-11 ENCOUNTER — Other Ambulatory Visit: Payer: Self-pay | Admitting: *Deleted

## 2012-05-11 MED ORDER — LENALIDOMIDE 10 MG PO CAPS: 10.0000 mg | ORAL_CAPSULE | Freq: Every day | ORAL | Status: DC

## 2012-06-05 ENCOUNTER — Other Ambulatory Visit: Payer: Self-pay | Admitting: *Deleted

## 2012-06-05 MED ORDER — LENALIDOMIDE 10 MG PO CAPS: 10.0000 mg | ORAL_CAPSULE | Freq: Every day | ORAL | Status: DC

## 2012-06-05 NOTE — Telephone Encounter (Signed)
Received a refill request from [facility]. Obtained auth # [id number]. Per Dr Gustavo Lah last none....to maintain on 10 mg per day 21 days on 7 days off. Faxed rx back to [phone].

## 2012-07-02 ENCOUNTER — Other Ambulatory Visit: Payer: Self-pay | Admitting: *Deleted

## 2012-07-02 DIAGNOSIS — C9000 Multiple myeloma not having achieved remission: Secondary | ICD-10-CM

## 2012-07-02 MED ORDER — LENALIDOMIDE 10 MG PO CAPS: 10.0000 mg | ORAL_CAPSULE | Freq: Every day | ORAL | Status: DC

## 2012-07-02 NOTE — Telephone Encounter (Signed)
Received a refill request from [facility]. Obtained auth # [id number]. Per Dr Gustavo Lah last note....to maintain on 10 mg per day 21 days on 7 days off. Faxed rx back to [phone] once approved & signed by Dr Myna Hidalgo.

## 2012-07-24 ENCOUNTER — Ambulatory Visit (HOSPITAL_BASED_OUTPATIENT_CLINIC_OR_DEPARTMENT_OTHER): Payer: Medicare HMO | Admitting: Hematology & Oncology

## 2012-07-24 ENCOUNTER — Ambulatory Visit (HOSPITAL_BASED_OUTPATIENT_CLINIC_OR_DEPARTMENT_OTHER): Payer: Medicare HMO

## 2012-07-24 ENCOUNTER — Other Ambulatory Visit (HOSPITAL_BASED_OUTPATIENT_CLINIC_OR_DEPARTMENT_OTHER): Payer: Medicare HMO | Admitting: Lab

## 2012-07-24 VITALS — BP 154/72 | HR 58 | Temp 97.3°F | Resp 18 | Ht 72.0 in | Wt 155.0 lb

## 2012-07-24 DIAGNOSIS — C9000 Multiple myeloma not having achieved remission: Secondary | ICD-10-CM

## 2012-07-24 DIAGNOSIS — G5790 Unspecified mononeuropathy of unspecified lower limb: Secondary | ICD-10-CM

## 2012-07-24 DIAGNOSIS — Z5112 Encounter for antineoplastic immunotherapy: Secondary | ICD-10-CM

## 2012-07-24 DIAGNOSIS — C61 Malignant neoplasm of prostate: Secondary | ICD-10-CM

## 2012-07-24 LAB — CBC WITH DIFFERENTIAL (CANCER CENTER ONLY)
BASO#: 0.1 10*3/uL (ref 0.0–0.2)
EOS%: 10.8 % — ABNORMAL HIGH (ref 0.0–7.0)
Eosinophils Absolute: 0.3 10*3/uL (ref 0.0–0.5)
HCT: 33.7 % — ABNORMAL LOW (ref 38.7–49.9)
HGB: 11.9 g/dL — ABNORMAL LOW (ref 13.0–17.1)
MCH: 32.4 pg (ref 28.0–33.4)
MCHC: 35.3 g/dL (ref 32.0–35.9)
MCV: 92 fL (ref 82–98)
MONO%: 21.1 % — ABNORMAL HIGH (ref 0.0–13.0)
NEUT#: 1.2 10*3/uL — ABNORMAL LOW (ref 1.5–6.5)
NEUT%: 49.4 % (ref 40.0–80.0)
RBC: 3.67 10*6/uL — ABNORMAL LOW (ref 4.20–5.70)

## 2012-07-24 LAB — IGG, IGA, IGM
IgA: 270 mg/dL (ref 68–379)
IgG (Immunoglobin G), Serum: 1210 mg/dL (ref 650–1600)

## 2012-07-24 MED ORDER — SODIUM CHLORIDE 0.9 % IV SOLN
Freq: Once | INTRAVENOUS | Status: AC
  Administered 2012-07-24: 12:00:00 via INTRAVENOUS

## 2012-07-24 MED ORDER — ZOLEDRONIC ACID 4 MG/5ML IV CONC
3.3000 mg | Freq: Once | INTRAVENOUS | Status: AC
  Administered 2012-07-24: 3.3 mg via INTRAVENOUS
  Filled 2012-07-24: qty 4.13

## 2012-07-24 NOTE — Progress Notes (Signed)
This office note has been dictated.

## 2012-07-24 NOTE — Patient Instructions (Addendum)
Zoledronic Acid injection (Hypercalcemia, Oncology) What is this medicine? ZOLEDRONIC ACID (ZOE le dron ik AS id) lowers the amount of calcium loss from bone. It is used to treat too much calcium in your blood from cancer. It is also used to prevent complications of cancer that has spread to the bone. This medicine may be used for other purposes; ask your health care provider or pharmacist if you have questions. What should I tell my health care provider before I take this medicine? They need to know if you have any of these conditions: -aspirin-sensitive asthma -dental disease -kidney disease -an unusual or allergic reaction to zoledronic acid, other medicines, foods, dyes, or preservatives -pregnant or trying to get pregnant -breast-feeding How should I use this medicine? This medicine is for infusion into a vein. It is given by a health care professional in a hospital or clinic setting. Talk to your pediatrician regarding the use of this medicine in children. Special care may be needed. Overdosage: If you think you have taken too much of this medicine contact a poison control center or emergency room at once. NOTE: This medicine is only for you. Do not share this medicine with others. What if I miss a dose? It is important not to miss your dose. Call your doctor or health care professional if you are unable to keep an appointment. What may interact with this medicine? -certain antibiotics given by injection -NSAIDs, medicines for pain and inflammation, like ibuprofen or naproxen -some diuretics like bumetanide, furosemide -teriparatide -thalidomide This list may not describe all possible interactions. Give your health care provider a list of all the medicines, herbs, non-prescription drugs, or dietary supplements you use. Also tell them if you smoke, drink alcohol, or use illegal drugs. Some items may interact with your medicine. What should I watch for while using this medicine? Visit  your doctor or health care professional for regular checkups. It may be some time before you see the benefit from this medicine. Do not stop taking your medicine unless your doctor tells you to. Your doctor may order blood tests or other tests to see how you are doing. Women should inform their doctor if they wish to become pregnant or think they might be pregnant. There is a potential for serious side effects to an unborn child. Talk to your health care professional or pharmacist for more information. You should make sure that you get enough calcium and vitamin D while you are taking this medicine. Discuss the foods you eat and the vitamins you take with your health care professional. Some people who take this medicine have severe bone, joint, and/or muscle pain. This medicine may also increase your risk for a broken thigh bone. Tell your doctor right away if you have pain in your upper leg or groin. Tell your doctor if you have any pain that does not go away or that gets worse. What side effects may I notice from receiving this medicine? Side effects that you should report to your doctor or health care professional as soon as possible: -allergic reactions like skin rash, itching or hives, swelling of the face, lips, or tongue -anxiety, confusion, or depression -breathing problems -changes in vision -feeling faint or lightheaded, falls -jaw burning, cramping, pain -muscle cramps, stiffness, or weakness -trouble passing urine or change in the amount of urine Side effects that usually do not require medical attention (report to your doctor or health care professional if they continue or are bothersome): -bone, joint, or muscle pain -  fever -hair loss -irritation at site where injected -loss of appetite -nausea, vomiting -stomach upset -tired This list may not describe all possible side effects. Call your doctor for medical advice about side effects. You may report side effects to FDA at  1-800-FDA-1088. Where should I keep my medicine? This drug is given in a hospital or clinic and will not be stored at home. NOTE: This sheet is a summary. It may not cover all possible information. If you have questions about this medicine, talk to your doctor, pharmacist, or health care provider.  2012, [employer]vier/Gold Standard. (01/25/2011 9:06:58 AM) 

## 2012-07-25 NOTE — Progress Notes (Signed)
CC:   Christian Burns. Christian Burns, M.D. Christian Burns Christian Burns, M.D.  DIAGNOSIS:  IgG lambda myeloma.  CURRENT THERAPY: 1. Revlimid 10 mg p.o. daily (21 days on/7 days off). 2. Zometa 3.3 mg IV q.4 months.  INTERIM HISTORY:  [name] comes in for his 20-month followup.  As always, he is doing fairly well.  He has really had no specific complaints.  He has neuropathy in his legs, which has been stable.  This mostly is in his feet.  I told him that he is not going to lose his legs as this is not a vascular problem.  His wife is still having issues with her knees.  She does not want any surgery because of other health issues.  He has had no issues going to the bathroom.  He has had no hematuria. There has been no frequency or dysuria.  He has had no fevers, sweats or chills.  He has had no headache.  PHYSICAL EXAMINATION:  General:  This is a well-developed, well- nourished white gentleman in no obvious distress.  Vital signs: Temperature of 97.3, pulse 58, respiratory rate 18, blood pressure 142/70.  Weight is 155.  Head and neck:  Normocephalic, atraumatic skull.  There are no ocular or oral lesions.  There are no palpable cervical or supraclavicular lymph nodes.  Lungs:  Clear bilaterally. Cardiac:  Regular rate and rhythm, with a normal S1 and S2.  There are no murmurs, rubs or bruits.  Abdomen:  Soft, with good bowel sounds. There is no palpable abdominal mass.  There is no fluid wave.  There is no palpable hepatosplenomegaly.  Back:  No tenderness of the spine, ribs, or hips.  There may be some slight kyphosis.  Extremities:  Show no clubbing, cyanosis or edema.  There may be some slight increased tenderness in his feet to palpation.  LABORATORY STUDIES:  White cell count 2.5, hemoglobin 11.9, hematocrit 33.7, platelet count 150,000.  IMPRESSION:  [name] is an 78 year old gentleman with IgG lambda myeloma.  He basically is on "maintenance" therapy.  Of note, back in August, his  monoclonal spike was stable at 0.41 gm/dL. His IgG level was 1180 mg/dL, and lambda light chain was 6 mg/dL.  Again, we will follow him every 4 months.  I do not see any need for any x-ray tests right now.  I am just glad that [name] has a decent quality of life.  I wish his poor wife would be able to get better with her knees.    ______________________________ Josph Macho, M.D. PRE/MEDQ  D:  07/24/2012  T:  07/25/2012  Job:  [id number]

## 2012-07-28 LAB — COMPREHENSIVE METABOLIC PANEL
Albumin: 3.6 g/dL (ref 3.5–5.2)
Alkaline Phosphatase: 87 U/L (ref 39–117)
BUN: 20 mg/dL (ref 6–23)
Calcium: 8.7 mg/dL (ref 8.4–10.5)
Creatinine, Ser: 1.19 mg/dL (ref 0.50–1.35)
Glucose, Bld: 101 mg/dL — ABNORMAL HIGH (ref 70–99)
Potassium: 4.1 mEq/L (ref 3.5–5.3)

## 2012-07-28 LAB — PROTEIN ELECTROPHORESIS, SERUM
Albumin ELP: 56 % (ref 55.8–66.1)
Alpha-1-Globulin: 4.4 % (ref 2.9–4.9)
Alpha-2-Globulin: 9.9 % (ref 7.1–11.8)

## 2012-07-28 LAB — KAPPA/LAMBDA LIGHT CHAINS
Kappa free light chain: 3.05 mg/dL — ABNORMAL HIGH (ref 0.33–1.94)
Kappa:Lambda Ratio: 0.43 (ref 0.26–1.65)

## 2012-08-14 ENCOUNTER — Other Ambulatory Visit: Payer: Self-pay | Admitting: *Deleted

## 2012-08-14 DIAGNOSIS — C9000 Multiple myeloma not having achieved remission: Secondary | ICD-10-CM

## 2012-08-14 MED ORDER — LENALIDOMIDE 10 MG PO CAPS: 10.0000 mg | ORAL_CAPSULE | Freq: Every day | ORAL | Status: DC

## 2012-08-14 NOTE — Telephone Encounter (Signed)
Pt called stating Received a refill request from [facility]. Obtained auth # [id number]. Per Dr Gustavo Lah last note....to maintain on 10 mg per day 21 days on 7 days off. Faxed rx back to [phone] once approved & signed by Dr Myna Hidalgo.

## 2012-09-09 ENCOUNTER — Other Ambulatory Visit: Payer: Self-pay | Admitting: *Deleted

## 2012-09-09 DIAGNOSIS — C9000 Multiple myeloma not having achieved remission: Secondary | ICD-10-CM

## 2012-09-09 MED ORDER — LENALIDOMIDE 10 MG PO CAPS: 10.0000 mg | ORAL_CAPSULE | Freq: Every day | ORAL | Status: DC

## 2012-09-09 NOTE — Telephone Encounter (Signed)
Received a refill request from [facility]. Obtained auth # [id number]. Faxed rx back to [phone] once signed by Dr Myna Hidalgo.

## 2012-10-20 ENCOUNTER — Other Ambulatory Visit: Payer: Self-pay | Admitting: Dermatology

## 2012-10-30 ENCOUNTER — Other Ambulatory Visit: Payer: Self-pay | Admitting: *Deleted

## 2012-10-30 DIAGNOSIS — C9000 Multiple myeloma not having achieved remission: Secondary | ICD-10-CM

## 2012-10-30 MED ORDER — LENALIDOMIDE 10 MG PO CAPS: ORAL_CAPSULE | ORAL | Status: DC

## 2012-11-20 ENCOUNTER — Ambulatory Visit (HOSPITAL_BASED_OUTPATIENT_CLINIC_OR_DEPARTMENT_OTHER): Payer: Medicare HMO | Admitting: Lab

## 2012-11-20 ENCOUNTER — Ambulatory Visit (HOSPITAL_BASED_OUTPATIENT_CLINIC_OR_DEPARTMENT_OTHER): Payer: Medicare HMO

## 2012-11-20 ENCOUNTER — Ambulatory Visit (HOSPITAL_BASED_OUTPATIENT_CLINIC_OR_DEPARTMENT_OTHER): Payer: Medicare HMO | Admitting: Medical

## 2012-11-20 VITALS — BP 141/74 | HR 56 | Temp 97.7°F | Resp 18 | Ht 70.0 in | Wt 150.0 lb

## 2012-11-20 DIAGNOSIS — C9000 Multiple myeloma not having achieved remission: Secondary | ICD-10-CM

## 2012-11-20 DIAGNOSIS — C61 Malignant neoplasm of prostate: Secondary | ICD-10-CM

## 2012-11-20 LAB — CMP (CANCER CENTER ONLY)
ALT(SGPT): 15 U/L (ref 10–47)
CO2: 28 mEq/L (ref 18–33)
Calcium: 8.6 mg/dL (ref 8.0–10.3)
Chloride: 104 mEq/L (ref 98–108)
Creat: 1.7 mg/dl — ABNORMAL HIGH (ref 0.6–1.2)
Sodium: 141 mEq/L (ref 128–145)
Total Protein: 7.2 g/dL (ref 6.4–8.1)

## 2012-11-20 LAB — CBC WITH DIFFERENTIAL (CANCER CENTER ONLY)
BASO#: 0.1 10*3/uL (ref 0.0–0.2)
BASO%: 2.8 % — ABNORMAL HIGH (ref 0.0–2.0)
EOS%: 8.1 % — ABNORMAL HIGH (ref 0.0–7.0)
HGB: 12 g/dL — ABNORMAL LOW (ref 13.0–17.1)
LYMPH#: 0.4 10*3/uL — ABNORMAL LOW (ref 0.9–3.3)
MCH: 32.6 pg (ref 28.0–33.4)
MCHC: 35.5 g/dL (ref 32.0–35.9)
MONO%: 6.7 % (ref 0.0–13.0)
NEUT#: 2 10*3/uL (ref 1.5–6.5)
Platelets: 140 10*3/uL — ABNORMAL LOW (ref 145–400)
RDW: 14.6 % (ref 11.1–15.7)

## 2012-11-20 MED ORDER — ZOLEDRONIC ACID 4 MG/5ML IV CONC
3.3000 mg | Freq: Once | INTRAVENOUS | Status: AC
  Administered 2012-11-20: 3.3 mg via INTRAVENOUS
  Filled 2012-11-20: qty 4.13

## 2012-11-20 NOTE — Progress Notes (Addendum)
Diagnosis: IgG lambda myeloma.  Current therapy: #1 Revlimid 10 mg by mouth daily (21 days on 7 days off). #2 Zometa 3.3 mg IV every 3-4 months.  Interim history: Christian Burns comes in today for an office followup visit.  Overall, he is continued to do quite well.  He continues to report some lower extremity neuropathy.  This is not changed.  His last protein studies back in December revealed an IgG of 1210, lambda free light chain 7.05mg /dL and an M spike of 1.61 g/dL. Marland Kitchen  He is tolerating the Revlimid quite well without any major side effects.  He denies any bowel or bladder problems.  He does have an elevated PSA, which is being followed by Dr. Retta Burns.  He denies any obvious, bleeding.  He denies any fevers, chills, or night sweats.  He denies any rashes.  He denies any headaches, visual changes, chest pain, shortness of breath, or cough.  He does receive Zometa 3.3 mg IV every 4 months.  Review of Systems: Constitutional:Negative for malaise/fatigue, fever, chills, weight loss, diaphoresis, activity change, appetite change, and unexpected weight change.  HEENT: Negative for double vision, blurred vision, visual loss, ear pain, tinnitus, congestion, rhinorrhea, epistaxis sore throat or sinus disease, oral pain/lesion, tongue soreness Respiratory: Negative for cough, chest tightness, shortness of breath, wheezing and stridor.  Cardiovascular: Negative for chest pain, palpitations, leg swelling, orthopnea, PND, DOE or claudication Gastrointestinal: Negative for nausea, vomiting, abdominal pain, diarrhea, constipation, blood in stool, melena, hematochezia, abdominal distention, anal bleeding, rectal pain, anorexia and hematemesis.  Genitourinary: Negative for dysuria, frequency, hematuria,  Musculoskeletal: Negative for myalgias, back pain, joint swelling, arthralgias and gait problem.  Skin: Negative for rash, color change, pallor and wound.  Neurological:. Negative for dizziness/light-headedness,  tremors, seizures, syncope, facial asymmetry, speech difficulty, weakness, numbness, headaches and paresthesias.  Hematological: Negative for adenopathy. Does not bruise/bleed easily.  Psychiatric/Behavioral:  Negative for depression, no loss of interest in normal activity or change in sleep pattern.   Physical Exam: This is a pleasant, 79 year old, gentleman, in no obvious distress Vitals: Temperature 97.7 degrees, pulse 56, respirations 18, blood pressure 141/74.  Weight 150 pounds HEENT reveals a normocephalic, atraumatic skull, no scleral icterus, no oral lesions  Neck is supple without any cervical or supraclavicular adenopathy.  Lungs are clear to auscultation bilaterally. There are no wheezes, rales or rhonci Cardiac is regular rate and rhythm with a normal S1 and S2. There are no murmurs, rubs, or bruits.  Abdomen is soft with good bowel sounds, there is no palpable mass. There is no palpable hepatosplenomegaly. There is no palpable fluid wave.  Musculoskeletal no tenderness of the spine, ribs, or hips.  Extremities there are no clubbing, cyanosis, or edema.  Skin no petechia, purpura or ecchymosis Neurologic is nonfocal.  Laboratory Data: White count 2.8, hemoglobin 12.0, hematocrit 33.8, platelets 140,000  Current Outpatient Prescriptions on File Prior to Visit  Medication Sig Dispense Refill  . aspirin 325 MG tablet Take 325 mg by mouth daily.      . calcium carbonate 200 MG capsule Take 250 mg by mouth daily.      . cholecalciferol (VITAMIN D) 1000 UNITS tablet Take 50,000 Units by mouth every 30 (thirty) days.       . Cyanocobalamin (VITAMIN B 12 PO) Take 1,000 mg by mouth every morning.      Marland Kitchen lenalidomide (REVLIMID) 10 MG capsule Take 1 capsule po daily for 21 days then 7 days off. Auth #[id number]  21 capsule  0  . Multiple Vitamins-Minerals (MULTIVITAL PO) Take by mouth every morning.       No current facility-administered medications on file prior to visit.    Assessment/Plan: This is a pleasant, 79 year old, gentleman, with the following issues.  #1 IgG lambda myeloma-he has done very well with the Revlimid.  His myeloma levels are still quite low.  We will recheck them today.   #2.  Supportive therapy.  He will continue to receive Zometa every 3-4 months.  #3 followup-we will see Christian Burns  Back in 4 months, but before then should there be questions or concerns.

## 2012-11-20 NOTE — Patient Instructions (Signed)
Zoledronic Acid injection (Hypercalcemia, Oncology) What is this medicine? ZOLEDRONIC ACID (ZOE le dron ik AS id) lowers the amount of calcium loss from bone. It is used to treat too much calcium in your blood from cancer. It is also used to prevent complications of cancer that has spread to the bone. This medicine may be used for other purposes; ask your health care provider or pharmacist if you have questions. What should I tell my health care provider before I take this medicine? They need to know if you have any of these conditions: -aspirin-sensitive asthma -dental disease -kidney disease -an unusual or allergic reaction to zoledronic acid, other medicines, foods, dyes, or preservatives -pregnant or trying to get pregnant -breast-feeding How should I use this medicine? This medicine is for infusion into a vein. It is given by a health care professional in a hospital or clinic setting. Talk to your pediatrician regarding the use of this medicine in children. Special care may be needed. Overdosage: If you think you have taken too much of this medicine contact a poison control center or emergency room at once. NOTE: This medicine is only for you. Do not share this medicine with others. What if I miss a dose? It is important not to miss your dose. Call your doctor or health care professional if you are unable to keep an appointment. What may interact with this medicine? -certain antibiotics given by injection -NSAIDs, medicines for pain and inflammation, like ibuprofen or naproxen -some diuretics like bumetanide, furosemide -teriparatide -thalidomide This list may not describe all possible interactions. Give your health care provider a list of all the medicines, herbs, non-prescription drugs, or dietary supplements you use. Also tell them if you smoke, drink alcohol, or use illegal drugs. Some items may interact with your medicine. What should I watch for while using this medicine? Visit  your doctor or health care professional for regular checkups. It may be some time before you see the benefit from this medicine. Do not stop taking your medicine unless your doctor tells you to. Your doctor may order blood tests or other tests to see how you are doing. Women should inform their doctor if they wish to become pregnant or think they might be pregnant. There is a potential for serious side effects to an unborn child. Talk to your health care professional or pharmacist for more information. You should make sure that you get enough calcium and vitamin D while you are taking this medicine. Discuss the foods you eat and the vitamins you take with your health care professional. Some people who take this medicine have severe bone, joint, and/or muscle pain. This medicine may also increase your risk for a broken thigh bone. Tell your doctor right away if you have pain in your upper leg or groin. Tell your doctor if you have any pain that does not go away or that gets worse. What side effects may I notice from receiving this medicine? Side effects that you should report to your doctor or health care professional as soon as possible: -allergic reactions like skin rash, itching or hives, swelling of the face, lips, or tongue -anxiety, confusion, or depression -breathing problems -changes in vision -feeling faint or lightheaded, falls -jaw burning, cramping, pain -muscle cramps, stiffness, or weakness -trouble passing urine or change in the amount of urine Side effects that usually do not require medical attention (report to your doctor or health care professional if they continue or are bothersome): -bone, joint, or muscle pain -  fever -hair loss -irritation at site where injected -loss of appetite -nausea, vomiting -stomach upset -tired This list may not describe all possible side effects. Call your doctor for medical advice about side effects. You may report side effects to FDA at  1-800-FDA-1088. Where should I keep my medicine? This drug is given in a hospital or clinic and will not be stored at home. NOTE: This sheet is a summary. It may not cover all possible information. If you have questions about this medicine, talk to your doctor, pharmacist, or health care provider.  2012, [employer]vier/[employer] Standard. (01/25/2011 9:06:58 AM) 

## 2012-11-24 LAB — KAPPA/LAMBDA LIGHT CHAINS: Lambda Free Lght Chn: 9.41 mg/dL — ABNORMAL HIGH (ref 0.57–2.63)

## 2012-11-24 LAB — PROTEIN ELECTROPHORESIS, SERUM, WITH REFLEX
Alpha-1-Globulin: 4.2 % (ref 2.9–4.9)
Alpha-2-Globulin: 9 % (ref 7.1–11.8)
Gamma Globulin: 21.3 % — ABNORMAL HIGH (ref 11.1–18.8)

## 2012-11-24 LAB — IFE INTERPRETATION

## 2012-11-24 LAB — PSA: PSA: 11.43 ng/mL — ABNORMAL HIGH (ref <=4.00)

## 2012-11-24 LAB — IGG, IGA, IGM: IgM, Serum: 49 mg/dL (ref 41–251)

## 2012-11-27 ENCOUNTER — Telehealth: Payer: Self-pay | Admitting: Oncology

## 2012-11-27 NOTE — Telephone Encounter (Addendum)
Message copied by Lacie Draft on Fri Nov 27, 2012  4:56 PM ------      Message from: Josph Macho      Created: Tue Nov 24, 2012  8:53 PM       Call - myeloma still low!!  Pete  Left message. Lance Sell Johnson  ------

## 2012-11-30 ENCOUNTER — Other Ambulatory Visit: Payer: Self-pay | Admitting: *Deleted

## 2012-11-30 DIAGNOSIS — C9000 Multiple myeloma not having achieved remission: Secondary | ICD-10-CM

## 2012-11-30 MED ORDER — LENALIDOMIDE 10 MG PO CAPS
ORAL_CAPSULE | ORAL | Status: DC
Start: 2012-11-30 — End: 2012-11-30

## 2012-11-30 MED ORDER — LENALIDOMIDE 10 MG PO CAPS: ORAL_CAPSULE | ORAL | Status: DC

## 2012-12-18 ENCOUNTER — Other Ambulatory Visit: Payer: Medicare HMO | Admitting: Lab

## 2012-12-18 ENCOUNTER — Ambulatory Visit: Payer: Medicare HMO

## 2012-12-25 ENCOUNTER — Other Ambulatory Visit: Payer: Self-pay | Admitting: *Deleted

## 2012-12-25 DIAGNOSIS — C9000 Multiple myeloma not having achieved remission: Secondary | ICD-10-CM

## 2012-12-25 MED ORDER — LENALIDOMIDE 10 MG PO CAPS: ORAL_CAPSULE | ORAL | Status: DC

## 2012-12-25 NOTE — Telephone Encounter (Signed)
Received a refill request from [facility]. Obtained auth # [id number]. Faxed rx back to [phone] once signed by Dr Myna Hidalgo.

## 2013-01-22 ENCOUNTER — Other Ambulatory Visit: Payer: Medicare HMO | Admitting: Lab

## 2013-01-22 ENCOUNTER — Other Ambulatory Visit: Payer: Self-pay | Admitting: *Deleted

## 2013-01-22 ENCOUNTER — Ambulatory Visit: Payer: Medicare HMO

## 2013-01-22 DIAGNOSIS — C9000 Multiple myeloma not having achieved remission: Secondary | ICD-10-CM

## 2013-01-22 MED ORDER — LENALIDOMIDE 10 MG PO CAPS: ORAL_CAPSULE | ORAL | Status: DC

## 2013-02-19 ENCOUNTER — Other Ambulatory Visit: Payer: Medicare HMO | Admitting: Lab

## 2013-02-19 ENCOUNTER — Ambulatory Visit: Payer: Medicare HMO

## 2013-02-24 ENCOUNTER — Other Ambulatory Visit: Payer: Self-pay | Admitting: *Deleted

## 2013-02-24 DIAGNOSIS — C9000 Multiple myeloma not having achieved remission: Secondary | ICD-10-CM

## 2013-02-24 MED ORDER — LENALIDOMIDE 10 MG PO CAPS: ORAL_CAPSULE | ORAL | Status: DC

## 2013-03-24 ENCOUNTER — Other Ambulatory Visit: Payer: Self-pay | Admitting: *Deleted

## 2013-03-24 DIAGNOSIS — C9000 Multiple myeloma not having achieved remission: Secondary | ICD-10-CM

## 2013-03-24 MED ORDER — LENALIDOMIDE 10 MG PO CAPS: ORAL_CAPSULE | ORAL | Status: DC

## 2013-03-26 ENCOUNTER — Ambulatory Visit (HOSPITAL_BASED_OUTPATIENT_CLINIC_OR_DEPARTMENT_OTHER): Payer: Medicare HMO

## 2013-03-26 ENCOUNTER — Other Ambulatory Visit: Payer: Medicare HMO | Admitting: Lab

## 2013-03-26 ENCOUNTER — Ambulatory Visit (HOSPITAL_BASED_OUTPATIENT_CLINIC_OR_DEPARTMENT_OTHER)
Admission: RE | Admit: 2013-03-26 | Discharge: 2013-03-26 | Disposition: A | Discharge status: Home / Self Care | Payer: Medicare HMO | Source: Ambulatory Visit | Attending: Hematology & Oncology | Admitting: Hematology & Oncology

## 2013-03-26 ENCOUNTER — Ambulatory Visit: Payer: Medicare HMO | Admitting: Hematology & Oncology

## 2013-03-26 ENCOUNTER — Ambulatory Visit (HOSPITAL_BASED_OUTPATIENT_CLINIC_OR_DEPARTMENT_OTHER): Payer: Medicare HMO | Admitting: Lab

## 2013-03-26 ENCOUNTER — Ambulatory Visit (HOSPITAL_BASED_OUTPATIENT_CLINIC_OR_DEPARTMENT_OTHER): Payer: Medicare HMO | Admitting: Hematology & Oncology

## 2013-03-26 VITALS — BP 151/73 | HR 62 | Temp 97.5°F | Resp 20 | Wt 148.0 lb

## 2013-03-26 DIAGNOSIS — M545 Low back pain, unspecified: Secondary | ICD-10-CM | POA: Insufficient documentation

## 2013-03-26 DIAGNOSIS — M1460 Charcot's joint, unspecified site: Secondary | ICD-10-CM

## 2013-03-26 DIAGNOSIS — C9000 Multiple myeloma not having achieved remission: Secondary | ICD-10-CM

## 2013-03-26 DIAGNOSIS — M51379 Other intervertebral disc degeneration, lumbosacral region without mention of lumbar back pain or lower extremity pain: Secondary | ICD-10-CM | POA: Insufficient documentation

## 2013-03-26 DIAGNOSIS — M5137 Other intervertebral disc degeneration, lumbosacral region: Secondary | ICD-10-CM | POA: Insufficient documentation

## 2013-03-26 DIAGNOSIS — Z87898 Personal history of other specified conditions: Secondary | ICD-10-CM | POA: Insufficient documentation

## 2013-03-26 DIAGNOSIS — G5790 Unspecified mononeuropathy of unspecified lower limb: Secondary | ICD-10-CM

## 2013-03-26 LAB — CMP (CANCER CENTER ONLY)
ALT(SGPT): 27 U/L (ref 10–47)
Albumin: 3.2 g/dL — ABNORMAL LOW (ref 3.3–5.5)
Alkaline Phosphatase: 99 U/L — ABNORMAL HIGH (ref 26–84)
Potassium: 4.3 mEq/L (ref 3.3–4.7)
Sodium: 137 mEq/L (ref 128–145)
Total Bilirubin: 2.2 mg/dl — ABNORMAL HIGH (ref 0.20–1.60)
Total Protein: 6.7 g/dL (ref 6.4–8.1)

## 2013-03-26 LAB — CBC WITH DIFFERENTIAL (CANCER CENTER ONLY)
BASO#: 0.1 10*3/uL (ref 0.0–0.2)
EOS%: 14.6 % — ABNORMAL HIGH (ref 0.0–7.0)
Eosinophils Absolute: 0.4 10*3/uL (ref 0.0–0.5)
HCT: 33.6 % — ABNORMAL LOW (ref 38.7–49.9)
HGB: 11.8 g/dL — ABNORMAL LOW (ref 13.0–17.1)
LYMPH#: 0.4 10*3/uL — ABNORMAL LOW (ref 0.9–3.3)
MCHC: 35.1 g/dL (ref 32.0–35.9)
MONO#: 0.4 10*3/uL (ref 0.1–0.9)
NEUT#: 1.2 10*3/uL — ABNORMAL LOW (ref 1.5–6.5)
NEUT%: 50.8 % (ref 40.0–80.0)
RBC: 3.58 10*6/uL — ABNORMAL LOW (ref 4.20–5.70)

## 2013-03-26 MED ORDER — SODIUM CHLORIDE 0.9 % IV SOLN
3.3000 mg | Freq: Once | INTRAVENOUS | Status: AC
  Administered 2013-03-26: 3.3 mg via INTRAVENOUS
  Filled 2013-03-26: qty 4.13

## 2013-03-26 MED ORDER — VITAMIN B-6 250 MG PO TABS: 250.0000 mg | ORAL_TABLET | Freq: Every day | ORAL | Status: DC

## 2013-03-26 NOTE — Patient Instructions (Signed)
Zoledronic Acid injection (Hypercalcemia, Oncology) What is this medicine? ZOLEDRONIC ACID (ZOE le dron ik AS id) lowers the amount of calcium loss from bone. It is used to treat too much calcium in your blood from cancer. It is also used to prevent complications of cancer that has spread to the bone. This medicine may be used for other purposes; ask your health care provider or pharmacist if you have questions. What should I tell my health care provider before I take this medicine? They need to know if you have any of these conditions: -aspirin-sensitive asthma -dental disease -kidney disease -an unusual or allergic reaction to zoledronic acid, other medicines, foods, dyes, or preservatives -pregnant or trying to get pregnant -breast-feeding How should I use this medicine? This medicine is for infusion into a vein. It is given by a health care professional in a hospital or clinic setting. Talk to your pediatrician regarding the use of this medicine in children. Special care may be needed. Overdosage: If you think you have taken too much of this medicine contact a poison control center or emergency room at once. NOTE: This medicine is only for you. Do not share this medicine with others. What if I miss a dose? It is important not to miss your dose. Call your doctor or health care professional if you are unable to keep an appointment. What may interact with this medicine? -certain antibiotics given by injection -NSAIDs, medicines for pain and inflammation, like ibuprofen or naproxen -some diuretics like bumetanide, furosemide -teriparatide -thalidomide This list may not describe all possible interactions. Give your health care provider a list of all the medicines, herbs, non-prescription drugs, or dietary supplements you use. Also tell them if you smoke, drink alcohol, or use illegal drugs. Some items may interact with your medicine. What should I watch for while using this medicine? Visit  your doctor or health care professional for regular checkups. It may be some time before you see the benefit from this medicine. Do not stop taking your medicine unless your doctor tells you to. Your doctor may order blood tests or other tests to see how you are doing. Women should inform their doctor if they wish to become pregnant or think they might be pregnant. There is a potential for serious side effects to an unborn child. Talk to your health care professional or pharmacist for more information. You should make sure that you get enough calcium and vitamin D while you are taking this medicine. Discuss the foods you eat and the vitamins you take with your health care professional. Some people who take this medicine have severe bone, joint, and/or muscle pain. This medicine may also increase your risk for a broken thigh bone. Tell your doctor right away if you have pain in your upper leg or groin. Tell your doctor if you have any pain that does not go away or that gets worse. What side effects may I notice from receiving this medicine? Side effects that you should report to your doctor or health care professional as soon as possible: -allergic reactions like skin rash, itching or hives, swelling of the face, lips, or tongue -anxiety, confusion, or depression -breathing problems -changes in vision -feeling faint or lightheaded, falls -jaw burning, cramping, pain -muscle cramps, stiffness, or weakness -trouble passing urine or change in the amount of urine Side effects that usually do not require medical attention (report to your doctor or health care professional if they continue or are bothersome): -bone, joint, or muscle pain -  fever -hair loss -irritation at site where injected -loss of appetite -nausea, vomiting -stomach upset -tired This list may not describe all possible side effects. Call your doctor for medical advice about side effects. You may report side effects to FDA at  1-800-FDA-1088. Where should I keep my medicine? This drug is given in a hospital or clinic and will not be stored at home. NOTE: This sheet is a summary. It may not cover all possible information. If you have questions about this medicine, talk to your doctor, pharmacist, or health care provider.  2013, [employer]vier/Gold Standard. (01/25/2011 9:06:58 AM)  

## 2013-03-26 NOTE — Progress Notes (Signed)
This office note has been dictated.

## 2013-03-27 NOTE — Progress Notes (Signed)
DIAGNOSIS:  IgG lambda myeloma.  CURRENT THERAPY: 1. Revlimid 10 mg p.o. daily (21 days on 7 days off). 2. Zometa 3.3 mg IV q.3 weeks.  INTERIM HISTORY:  [name] comes in for followup.  His wife is having the problems that prevent them from doing much.  She has bad knees. Because of this, they really cannot travel much.  His last monoclonal studies back in April did show his monoclonal spike to be trending upward.  It was 0.7 g/dL.  His IgG level was 1400 mg/dL. Lambda light chain was 9.4 mg/dL.  He has had no abdominal pain.  He has had no cough.  His lower back has been hurting him a little bit.  We may need to get some x-rays on this.  There has been no leg pain.  He has had no leg weakness.  He has had no change in bowel or bladder habits.  He has some irregularity, which is chronic.  PHYSICAL EXAMINATION:  General:  This is an elderly white gentleman in no obvious distress.  Vital signs:  Temperature of 97.5, pulse 62, respiratory rate 20, blood pressure 151/73.  Weight is 148.  Head and neck:  Normocephalic, atraumatic skull.  There are no ocular or oral lesions.  There are no palpable cervical or supraclavicular lymph nodes. Lungs:  Clear bilaterally.  Cardiac:  Regular rate and rhythm with a normal S1 and S2.  There are no murmurs, rubs or bruits.  Abdomen: Soft.  He has good bowel sounds.  There is no fluid wave.  There is no flank tenderness.  There is no palpable hepatosplenomegaly. Extremities:  Show some osteoarthritic changes in his joints.  He has good range motion of his joints.  He has some muscle atrophy in his upper and lower extremities.  Back:  No tenderness in the lower spine. Neurological:  No focal neurological deficits.  LABORATORY STUDIES:  White cell count 2.4, hemoglobin 11.8, hematocrit 33.6, platelet count 120.  IMPRESSION:  Christian Burns is an 79 year old gentleman with IgG lambda myeloma.  Again, the monoclonal spike is trending upward.  I think  that if we see a further increase, then we will have to re-stage him.  He has dealt with myeloma now probably for about 7 years.  He has done incredibly well.  We will give him his Zometa today.  I want to see him back in another 3 months or so.  I forgot to mention that he does complain of some neuropathy in his feet.  He is taking vitamin B12.  I told him to try vitamin B6 one hundred milligrams twice a day.    ______________________________ Josph Macho, M.D. PRE/MEDQ  D:  03/26/2013  T:  03/27/2013  Job:  [id number]

## 2013-03-29 ENCOUNTER — Telehealth: Payer: Self-pay | Admitting: *Deleted

## 2013-03-29 NOTE — Telephone Encounter (Addendum)
Message copied by Wynonia Hazard on Mon Mar 29, 2013  4:01 PM ------      Message from: Arlan Organ R      Created: Mon Mar 29, 2013  7:43 AM       Please call and let him know that his spine x-rays shows arthritis and degeneration. No obvious myeloma. Thanks. Christian Burns   Spoke to pt regarding the above results. He verbalized understanding.

## 2013-03-30 LAB — PROTEIN ELECTROPHORESIS, SERUM, WITH REFLEX
Beta 2: 4.7 % (ref 3.2–6.5)
Gamma Globulin: 22.5 % — ABNORMAL HIGH (ref 11.1–18.8)

## 2013-03-30 LAB — IGG, IGA, IGM
IgA: 323 mg/dL (ref 68–379)
IgG (Immunoglobin G), Serum: 1550 mg/dL (ref 650–1600)
IgM, Serum: 55 mg/dL (ref 41–251)

## 2013-04-08 ENCOUNTER — Telehealth: Payer: Self-pay | Admitting: Hematology & Oncology

## 2013-04-08 NOTE — Telephone Encounter (Signed)
Faxed Medical Records via fax today  to:  [facility] [address] [place], [place] [address] [place]  [id number] Ph: [phone] Fx: [phone]  Medical  Records requested from 08/13/2011 to present   CONSENT COPY SCANNED

## 2013-04-23 ENCOUNTER — Other Ambulatory Visit: Payer: Self-pay | Admitting: *Deleted

## 2013-04-23 DIAGNOSIS — C9000 Multiple myeloma not having achieved remission: Secondary | ICD-10-CM

## 2013-04-23 MED ORDER — LENALIDOMIDE 10 MG PO CAPS: ORAL_CAPSULE | ORAL | Status: DC

## 2013-04-30 ENCOUNTER — Ambulatory Visit
Admission: RE | Admit: 2013-04-30 | Discharge: 2013-04-30 | Disposition: A | Discharge status: Home / Self Care | Payer: Medicare Other | Source: Ambulatory Visit | Attending: Internal Medicine | Admitting: Internal Medicine

## 2013-04-30 ENCOUNTER — Other Ambulatory Visit: Payer: Self-pay | Admitting: Internal Medicine

## 2013-04-30 DIAGNOSIS — M545 Low back pain, unspecified: Secondary | ICD-10-CM

## 2013-04-30 DIAGNOSIS — C9000 Multiple myeloma not having achieved remission: Secondary | ICD-10-CM

## 2013-05-18 ENCOUNTER — Other Ambulatory Visit: Payer: Self-pay | Admitting: *Deleted

## 2013-05-18 DIAGNOSIS — C9000 Multiple myeloma not having achieved remission: Secondary | ICD-10-CM

## 2013-05-18 MED ORDER — LENALIDOMIDE 10 MG PO CAPS: 10.0000 mg | ORAL_CAPSULE | Freq: Every day | ORAL | Status: DC

## 2013-05-18 NOTE — Telephone Encounter (Signed)
Received a refill request from [facility] for Revlimid 10 mg 21 days on 7 days off. Obtained auth # [id number] from [facility]. Faxed rx back to [phone] once signed by Dr Myna Hidalgo.

## 2013-05-24 ENCOUNTER — Other Ambulatory Visit: Payer: Self-pay | Admitting: Internal Medicine

## 2013-05-24 DIAGNOSIS — M25552 Pain in left hip: Secondary | ICD-10-CM

## 2013-05-27 ENCOUNTER — Other Ambulatory Visit: Payer: Self-pay | Admitting: Internal Medicine

## 2013-05-27 DIAGNOSIS — M25552 Pain in left hip: Secondary | ICD-10-CM

## 2013-05-30 ENCOUNTER — Other Ambulatory Visit: Payer: Medicare HMO

## 2013-05-30 ENCOUNTER — Ambulatory Visit
Admission: RE | Admit: 2013-05-30 | Discharge: 2013-05-30 | Disposition: A | Discharge status: Home / Self Care | Payer: Medicare Other | Source: Ambulatory Visit | Attending: Internal Medicine | Admitting: Internal Medicine

## 2013-05-30 DIAGNOSIS — M25552 Pain in left hip: Secondary | ICD-10-CM

## 2013-05-30 MED ORDER — GADOBENATE DIMEGLUMINE 529 MG/ML IV SOLN
13.0000 mL | Freq: Once | INTRAVENOUS | Status: AC | PRN
  Administered 2013-05-30: 13 mL via INTRAVENOUS

## 2013-05-31 ENCOUNTER — Other Ambulatory Visit: Payer: Medicare HMO

## 2013-06-01 ENCOUNTER — Other Ambulatory Visit: Payer: Medicare HMO

## 2013-06-04 ENCOUNTER — Other Ambulatory Visit: Payer: Self-pay | Admitting: Family Medicine

## 2013-06-04 DIAGNOSIS — K439 Ventral hernia without obstruction or gangrene: Secondary | ICD-10-CM

## 2013-06-08 ENCOUNTER — Ambulatory Visit
Admission: RE | Admit: 2013-06-08 | Discharge: 2013-06-08 | Disposition: A | Discharge status: Home / Self Care | Payer: Medicare HMO | Source: Ambulatory Visit | Attending: Family Medicine | Admitting: Family Medicine

## 2013-06-08 DIAGNOSIS — K439 Ventral hernia without obstruction or gangrene: Secondary | ICD-10-CM

## 2013-06-08 MED ORDER — IOHEXOL 300 MG/ML  SOLN
100.0000 mL | Freq: Once | INTRAMUSCULAR | Status: AC | PRN
  Administered 2013-06-08: 100 mL via INTRAVENOUS

## 2013-06-11 ENCOUNTER — Other Ambulatory Visit: Payer: Self-pay | Admitting: Hematology & Oncology

## 2013-06-11 DIAGNOSIS — C9000 Multiple myeloma not having achieved remission: Secondary | ICD-10-CM

## 2013-06-11 MED ORDER — DEXAMETHASONE 4 MG PO TABS: ORAL_TABLET | ORAL | Status: DC

## 2013-06-11 NOTE — Progress Notes (Signed)
I was called by Dr. Jarold Motto about the CT scan report.  Looks like myeloma has recurred.  I spoke with [name] and he told me his symptoms.  He has right groin pain.  NO leg pain or weakness or bowel/bladder issues.  We need to get an MRI this weekend.  I will put him on Decadron.  I told him to take the Decadron with food and he understood this. I also told him to take OTC Pepcid 2x a day. I told him to stop the ASA.  We will need to do either XRT or possibly surgery.  We may ned a bx if no surgery.  I will call him with the results.  Will need to get him in the office on Monday.  I told him to call if he has any problems like leg weakness, bowel or bladder problems, etc..   Cindee Lame

## 2013-06-13 ENCOUNTER — Other Ambulatory Visit: Payer: Self-pay | Admitting: Hematology & Oncology

## 2013-06-14 ENCOUNTER — Other Ambulatory Visit (HOSPITAL_COMMUNITY): Payer: Self-pay | Admitting: Internal Medicine

## 2013-06-14 ENCOUNTER — Other Ambulatory Visit: Payer: Self-pay | Admitting: *Deleted

## 2013-06-14 ENCOUNTER — Telehealth: Payer: Self-pay | Admitting: Hematology & Oncology

## 2013-06-14 DIAGNOSIS — C9000 Multiple myeloma not having achieved remission: Secondary | ICD-10-CM

## 2013-06-14 DIAGNOSIS — M25552 Pain in left hip: Secondary | ICD-10-CM

## 2013-06-14 NOTE — Telephone Encounter (Signed)
Pt aware of 11-5 MRI and lab at 2pm [facility]. Amy will put in lab orders

## 2013-06-16 ENCOUNTER — Telehealth: Payer: Self-pay | Admitting: *Deleted

## 2013-06-16 ENCOUNTER — Ambulatory Visit (HOSPITAL_COMMUNITY)
Admission: RE | Admit: 2013-06-16 | Discharge: 2013-06-16 | Disposition: A | Discharge status: Home / Self Care | Payer: Medicare HMO | Source: Ambulatory Visit | Attending: Hematology & Oncology | Admitting: Hematology & Oncology

## 2013-06-16 ENCOUNTER — Encounter: Payer: Self-pay | Admitting: *Deleted

## 2013-06-16 DIAGNOSIS — C9000 Multiple myeloma not having achieved remission: Secondary | ICD-10-CM

## 2013-06-16 DIAGNOSIS — M47817 Spondylosis without myelopathy or radiculopathy, lumbosacral region: Secondary | ICD-10-CM | POA: Insufficient documentation

## 2013-06-16 DIAGNOSIS — R109 Unspecified abdominal pain: Secondary | ICD-10-CM | POA: Insufficient documentation

## 2013-06-16 DIAGNOSIS — M8448XA Pathological fracture, other site, initial encounter for fracture: Secondary | ICD-10-CM

## 2013-06-16 DIAGNOSIS — M51379 Other intervertebral disc degeneration, lumbosacral region without mention of lumbar back pain or lower extremity pain: Secondary | ICD-10-CM | POA: Insufficient documentation

## 2013-06-16 DIAGNOSIS — M5137 Other intervertebral disc degeneration, lumbosacral region: Secondary | ICD-10-CM | POA: Insufficient documentation

## 2013-06-16 LAB — CREATININE, SERUM: Creatinine, Ser: 1.28 mg/dL (ref 0.50–1.35)

## 2013-06-16 MED ORDER — GADOBENATE DIMEGLUMINE 529 MG/ML IV SOLN
15.0000 mL | Freq: Once | INTRAVENOUS | Status: AC | PRN
  Administered 2013-06-16: 13 mL via INTRAVENOUS

## 2013-06-16 NOTE — Progress Notes (Unsigned)
Pt was instructed to discontinue the Revlimid when he started taking the Decadron on 06/11/13.

## 2013-06-16 NOTE — Telephone Encounter (Signed)
Received a call from Dr Chestine Spore in radiology regarding his MRI from today. He stated the pt has a "pathologic fracture of L2 on the right side. 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 Spinal canal is adequately patent. No other fracture".  Spoke to Dr Roselind Messier in Radiation Oncology in Dr Gustavo Lah absence. He reviewed the MRI and recommended immediate XRT starting tomorrow. Christian Burns will receive a call from his office in the morning with an appt.   1730- Spoke to Christian Burns. Gave him the results of the MRI. Confirmed he is taking the Decadron as prescribed by Dr Myna Hidalgo on 06-11-13. He denies any problems his bladder, bowels, or walking. He rates his pain around a 2 or 3. Christian Burns was instructed to go directed to the ER if he develops any problems as he currently doesn't have any spinal cord compression but could be at risk for it. He verbalized understanding and will await Dr Trina Ao call in the am. Explained that Dr Myna Hidalgo would most likely give him a call at some point as well.

## 2013-06-17 ENCOUNTER — Ambulatory Visit
Admission: RE | Admit: 2013-06-17 | Discharge: 2013-06-17 | Disposition: A | Discharge status: Home / Self Care | Payer: Medicare HMO | Source: Ambulatory Visit | Attending: Radiation Oncology | Admitting: Radiation Oncology

## 2013-06-17 ENCOUNTER — Other Ambulatory Visit: Payer: Self-pay | Admitting: Hematology & Oncology

## 2013-06-17 ENCOUNTER — Encounter: Payer: Self-pay | Admitting: Radiation Oncology

## 2013-06-17 VITALS — BP 174/90 | HR 65 | Temp 97.4°F | Ht 70.0 in | Wt 146.3 lb

## 2013-06-17 DIAGNOSIS — K4090 Unilateral inguinal hernia, without obstruction or gangrene, not specified as recurrent: Secondary | ICD-10-CM | POA: Insufficient documentation

## 2013-06-17 DIAGNOSIS — I728 Aneurysm of other specified arteries: Secondary | ICD-10-CM | POA: Insufficient documentation

## 2013-06-17 DIAGNOSIS — M549 Dorsalgia, unspecified: Secondary | ICD-10-CM | POA: Insufficient documentation

## 2013-06-17 DIAGNOSIS — IMO0002 Reserved for concepts with insufficient information to code with codable children: Secondary | ICD-10-CM | POA: Insufficient documentation

## 2013-06-17 DIAGNOSIS — G609 Hereditary and idiopathic neuropathy, unspecified: Secondary | ICD-10-CM | POA: Insufficient documentation

## 2013-06-17 DIAGNOSIS — C9000 Multiple myeloma not having achieved remission: Secondary | ICD-10-CM

## 2013-06-17 DIAGNOSIS — Z510 Encounter for antineoplastic radiation therapy: Secondary | ICD-10-CM | POA: Insufficient documentation

## 2013-06-17 DIAGNOSIS — R5381 Other malaise: Secondary | ICD-10-CM | POA: Insufficient documentation

## 2013-06-17 DIAGNOSIS — R209 Unspecified disturbances of skin sensation: Secondary | ICD-10-CM | POA: Insufficient documentation

## 2013-06-17 DIAGNOSIS — M8448XA Pathological fracture, other site, initial encounter for fracture: Secondary | ICD-10-CM | POA: Insufficient documentation

## 2013-06-17 DIAGNOSIS — Z79899 Other long term (current) drug therapy: Secondary | ICD-10-CM | POA: Insufficient documentation

## 2013-06-17 MED ORDER — ASPIRIN EC 81 MG PO TBEC: 81.0000 mg | DELAYED_RELEASE_TABLET | Freq: Every day | ORAL | Status: DC

## 2013-06-17 NOTE — Progress Notes (Signed)
Radiation Oncology         [phone]) [phone] ________________________________  Initial outpatient Consultation  Name: KHOLTON COATE MRN: [id number]  Date: 06/17/2013  DOB: 1930-01-21  NW:GNFAOZHY,QMVHQI G, MD  Myna Hidalgo Rose Phi, MD   REFERRING PHYSICIAN: Josph Macho, MD  DIAGNOSIS: Multiple myeloma, IgG lambda   HISTORY OF PRESENT ILLNESS::Jarious T Seever is a 79 y.o. male who is een out courtesy of Dr. Arlan Organ for an opinion concerning radiation therapy as part of management of patient's progressive multiple myeloma.  The patient has a prior history of IgG lambda myeloma lasting over approximately 7 years and has done remarkably well with his diagnosis. He most recently has been treated with Revlimid. Earlier this fall the patient began having some pain in the right abdominal area and right groin area. He was felt to possibly have a hernia and a the CT scan was ordered which showed no obvious hernia but did show a lesion along the L2 vertebral body suspicious for tumor involvement. The patient proceeded to undergo MRI of the lumbar spine, documented below which shows significant involvement of a tumor with a pathologic fracture and epidural tumor extension. Given these findings the patient is now seen in radiation oncology for urgent treatments.  PREVIOUS RADIATION THERAPY: No  PAST MEDICAL HISTORY:  has no past medical history on file.    PAST SURGICAL HISTORY:History reviewed. No pertinent past surgical history.  FAMILY HISTORY: family history is not on file.  SOCIAL HISTORY:  reports that he has never smoked. He does not have any smokeless tobacco history on file. He reports that he drinks alcohol.  ALLERGIES: Sulfa antibiotics  MEDICATIONS:  Current Outpatient Prescriptions  Medication Sig Dispense Refill  . calcium carbonate 200 MG capsule Take 250 mg by mouth daily.      . cholecalciferol (VITAMIN D) 1000 UNITS tablet Take 50,000 Units by mouth every 30 (thirty) days.        . Cyanocobalamin (VITAMIN B 12 PO) Take 1,000 mg by mouth every morning.      Marland Kitchen dexamethasone (DECADRON) 4 MG tablet 4 mg 3 (three) times daily. 2 po TID      . Multiple Vitamins-Minerals (MULTIVITAL PO) Take by mouth every morning.      . Pyridoxine HCl (VITAMIN B-6) 250 MG tablet Take 1 tablet (250 mg total) by mouth daily.  30 tablet  6  . aspirin EC 81 MG tablet Take 1 tablet (81 mg total) by mouth daily.  30 tablet  12   No current facility-administered medications for this encounter.    REVIEW OF SYSTEMS:  A 15 point review of systems is documented in the electronic medical record. This was obtained by the nursing staff. However, I reviewed this with the patient to discuss relevant findings and make appropriate changes. He has had some discomfort along the right abdominal region and right groin area. He denies any weakness along his right or left lower extremity. He denies any bowel or bladder incontinence or urinary retention. He does have some mild peripheral neuropathy. He denies any significant pain in the back region. Apparently his Decadron has been helping.   PHYSICAL EXAM:  height is 5\' 10"  (1.778 m) and weight is 146 lb 4.8 oz (66.361 kg). His temperature is 97.4 F (36.3 C). His blood pressure is 174/90 and his pulse is 65.   BP 174/90  Pulse 65  Temp(Src) 97.4 F (36.3 C)  Ht 5\' 10"  (1.778 m)  Wt 146 lb 4.8  oz (66.361 kg)  BMI 20.99 kg/m2  General Appearance:    Alert, cooperative, no distress, appears stated age,  accompanied by a son on evaluation today   Head:    Normocephalic, without obvious abnormality, atraumatic  Eyes:    PERRL, conjunctiva/corneas clear, EOM's intact,        Ears:    Normal TM's and external ear canals, both ears, hearing aid along the right side   Nose:   Nares normal, septum midline, mucosa normal, no drainage    or sinus tenderness  Throat:   Lips, mucosa, and tongue normal; teeth and gums normal  Neck:   Supple, symmetrical, trachea  midline, no adenopathy;       thyroid:  No enlargement/tenderness/nodules; no carotid   bruit or JVD  Back:     Symmetric, no curvature, ROM normal, no CVA tenderness  Lungs:     Clear to auscultation bilaterally, respirations unlabored  Chest wall:    No tenderness or deformity  Heart:    Regular rate and rhythm, S1 and S2 normal, no murmur, rub   or gallop  Abdomen:     Soft, non-tender, bowel sounds active all four quadrants,    no masses, no organomegaly, the patient does have a easily reducible right inguinal hernia, this disappears when lying flat   Genitalia:    Normal male without lesion, discharge or tenderness, no testicular masses      Extremities:   Extremities normal, atraumatic, no cyanosis or edema  Pulses:   2+ and symmetric all extremities  Skin:   Skin color, texture, turgor normal, no rashes or lesions  Lymph nodes:   Cervical, supraclavicular, and axillary nodes normal  Neurologic:   CNII-XII intact. Normal strength,         ECOG =1   LABORATORY DATA:  Lab Results  Component Value Date   WBC 2.4* 03/26/2013   HGB 11.8* 03/26/2013   HCT 33.6* 03/26/2013   MCV 94 03/26/2013   PLT 120* 03/26/2013   Lab Results  Component Value Date   NA 137 03/26/2013   K 4.3 03/26/2013   CL 100 03/26/2013   CO2 30 03/26/2013   Lab Results  Component Value Date   ALT 27 03/26/2013   AST 25 03/26/2013   ALKPHOS 99* 03/26/2013   BILITOT 2.20* 03/26/2013     RADIOGRAPHY: Mr Lumbar Spine W Wo Contrast  06/16/2013   CLINICAL DATA:  Multiple myeloma. Right groin pain. L2 invasion by tumor.  EXAM: MRI LUMBAR SPINE WITHOUT AND WITH CONTRAST  TECHNIQUE: Multiplanar and multiecho pulse sequences of the lumbar spine were obtained without and with intravenous contrast.  CONTRAST:  13mL MULTIHANCE GADOBENATE DIMEGLUMINE 529 MG/ML IV SOLN  COMPARISON:  CT.  FINDINGS: Pathologic fracture of L2 on the right side. There is tumor extending into the spinal canal on the right site with flattening of  the thecal sac and likely causing impingement of the right L3 nerve root. Spinal canal is adequately patent. No other fracture.  Bone marrow is heterogeneous throughout the lumbar spine which could be due to myelomatous infiltration. This also is suggested on the CT scan.  Conus medullaris is normal and terminates at L1-2. Tarlov cysts in the sacral canal appear benign.  L1-2: Negative for disc protrusion or stenosis. Right-sided epidural tumor is present at L2  L2-3: Disc degeneration with disk bulging. Mild facet degeneration.  L3-4: Moderate disc degeneration and spondylosis with mild facet degeneration.  L4-5: Disc degeneration and spondylosis. Mild  facet degeneration  L5-S1:  Disc degeneration and mild spondylosis.  IMPRESSION: Tumor infiltration throughout the central and right-sided the L2 vertebral body with a pathologic fracture. Epidural tumor is present in the canal on the right side with impingement of right-sided nerve roots corresponding to right groin pain. Probable other small myelomatous lesions in the lumbar spine. No other fracture.  Lumbar degenerative changes and spondylosis as described above.  I discussed the findings by telephone with Dr. Gustavo Lah nurse, Amy   Electronically Signed   By: Marlan Palau M.D.   On: 06/16/2013 17:04   Mr Hip Left W Wo Contrast  05/30/2013   CLINICAL DATA:  Low back pain. History of multiple myeloma. Possible lytic lesion on radiographs.  EXAM: MRI OF THE LEFT HIP WITHOUT AND WITH CONTRAST  TECHNIQUE: Multiplanar, multisequence MR imaging was performed both before and after administration of intravenous contrast.  CONTRAST:  13mL MULTIHANCE GADOBENATE DIMEGLUMINE 529 MG/ML IV SOLN  COMPARISON:  Lumbar spine and pelvic radiographs 04/30/2013.  FINDINGS: No lytic lesions are identified. Specifically, the left femoral head appears normal. There is mild sacral marrow heterogeneity. There is no suspicious marrow signal or enhancement. There is no evidence of  fracture or avascular necrosis  There are no significant degenerative changes of the hips or sacroiliac joints for age.  The gluteus, hamstring and iliopsoas tendons appear normal. No focal muscular abnormalities are identified.  There is heterogeneous central enlargement of the prostate gland consistent with benign prostatic hypertrophy. No bladder abnormalities are seen. There are small bilateral scrotal hydroceles.  IMPRESSION: 1. No evidence of lytic lesion in the pelvis or proximal femurs. 2. No acute pelvic findings. 3. Central enlargement of the prostate gland and small scrotal hydroceles noted incidentally.   Electronically Signed   By: Roxy Horseman M.D.   On: 05/30/2013 18:15   Ct Abdomen Pelvis W Contrast  06/08/2013   CLINICAL DATA:  Right lower quadrant pain, constipation, history of multiple myeloma  EXAM: CT ABDOMEN AND PELVIS WITH CONTRAST  TECHNIQUE: Multidetector CT imaging of the abdomen and pelvis was performed using the standard protocol following bolus administration of intravenous contrast.  CONTRAST:  OMNIPAQUE IOHEXOL 300 MG/ML  SOLN  COMPARISON:  None.  FINDINGS: A 4 mm noncalcified nodule is noted in the anterior lateral right lower lobe of doubtful significance in this age patient. The liver enhances and there are simple appearing cysts in the right and left lobes of liver. No calcified gallstones are seen within the contracted gallbladder. The pancreas is normal in size and only minimal prominence of the pancreatic duct and common bile duct is noted. Correlation with liver function tests is recommended. The right adrenal gland is unremarkable with slight prominence of the left adrenal gland probably due to incidental adenoma. The spleen is normal in size. Left renal cysts are present many of which are parapelvic. No renal calculi are noted and on delayed images, the pelvocaliceal systems are unremarkable other than splaying caused by the left parapelvic renal cysts. 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 With such heavy calcification, these aneurysms are of doubtful clinical significance in this age patient. The abdominal aorta is normal in caliber. No adenopathy is seen.  At the site of the vitamin-E tablet overlying the right anterior pelvis, no underlying mass or other abnormality is seen. Just below  this level there is a portion of the transverse colon noted. No evidence of abdominal wall hernia is noted. The urinary bladder is slightly distended and thick-walled most likely due to a degree of bladder outlet obstruction in this patient with a large prostate measuring 5.3 x 6.0 cm. The terminal ileum is unremarkable. The appendix is not definitely seen. There is a lytic lesion involving the much of the L2 vertebral body probably related to the patient's history of multiple myeloma. Some of the soft tissue extends posteriorly narrowing the central canal at the L2 level. Scattered lucencies is also noted throughout multiple vertebra and the bones of the pelvis again consistent with multiple myeloma.  IMPRESSION: 1. Almost complete destruction of L2 vertebral body most likely due to multiple myeloma with soft tissue extension posteriorly narrowing the central canal at the L2 level. 2. Multiple lucencies scattered throughout the bones diffusely in this patient with multiple myeloma most consistent with myelomatous lesions. 3. No abdominal wall hernia is seen. At the site questioned in the right anterior pelvis, a segment of the transverse colon is noted. 4. Prominent prostate with probable degree of bladder outlet obstruction. 5. Slight prominence of the common bile duct and pancreatic duct. Correlate with liver function tests. 6. 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   Electronically Signed   By: Dwyane Dee M.D.   On: 06/08/2013 13:25      IMPRESSION: Progressive IgG lambda myeloma. Patient has significant disease in the upper lumbar spine with a pathologic fracture and nerve root impingement. I would recommend palliative radiation therapy directed to this area. I discussed the overall treatment course side effects and potential toxicities of radiation therapy in this situation with the patient and his family. The patient appears to understand and wishes to proceed with planned course of treatment.   PLAN: Simulation planning and his first treatment later today.   I spent 60 minutes minutes face to face with the patient and more than 50% of that time was spent in counseling and/or coordination of care.   ------------------------------------------------  -----------------------------------  Billie Lade, PhD, MD

## 2013-06-17 NOTE — Progress Notes (Signed)
[facility] Visit

## 2013-06-17 NOTE — Progress Notes (Signed)
Histology and Location of Primary Cancer: IgG lambda myeloma. Monoclonal  Spike Trending Up.  Diagnosed 7 years ago  Sites of Visceral and Bony Metastatic Disease: MRI of the Lumbar spine on 06/16/13 revealed:   " Tumor infiltration throughout the central and right-sided the L2 vertebral body with a pathologic fracture. Epidural tumor is present in the canal on the right side with impingement of right-sided nerve roots corresponding to right groin pain. Probable other small myelomatous lesions in the lumbar spine. No other fracture."  Location(s) of Symptomatic Metastases: L2 Veterbral body.  Pathological Fracture   Past/Anticipated chemotherapy by medical oncology, if any: Current Therapy: Revlimid 10 mg p.o. daily (21 days on 7 days off) and  Zometa 3.3 mg IV q.3 weeks.   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 Walker? Wheelchair?: Ambulatory  SAFETY ISSUES:  Prior radiation?   Pacemaker/ICD?   Possible current pregnancy? N/A  Is the patient on methotrexate? No  Current Complaints / other details:

## 2013-06-17 NOTE — Progress Notes (Signed)
  Radiation Oncology         [phone] ________________________________   Name: Christian Burns MRN: [id number]  Date: 06/17/2013  DOB: 09-Jan-1930  Simulation Verification Note  Status: outpatient  NARRATIVE: The patient was brought to the treatment unit and placed in the planned treatment position. The clinical setup was verified. Then port films were obtained and uploaded to the radiation oncology medical record software.  The treatment beams were carefully compared against the planned radiation fields. The position location and shape of the radiation fields was reviewed. They targeted volume of tissue appears to be appropriately covered by the radiation beams. Organs at risk appear to be excluded as planned.  Based on my personal review, I approved the simulation verification. The patient's treatment will proceed as planned.  -----------------------------------  Billie Lade, PhD, MD

## 2013-06-17 NOTE — Progress Notes (Signed)
  Radiation Oncology         [phone]) [phone] ________________________________  Name: Christian Burns MRN: [id number]  Date: 06/17/2013  DOB: [birth year]  SIMULATION AND TREATMENT PLANNING NOTE  DIAGNOSIS:  Multiple myeloma  NARRATIVE:  The patient was brought to the CT Simulation planning suite.  Identity was confirmed.  All relevant records and images related to the planned course of therapy were reviewed.  The patient freely provided informed written consent to proceed with treatment after reviewing the details related to the planned course of therapy. The consent form was witnessed and verified by the simulation staff.  Then, the patient was set-up in a stable reproducible  supine position for radiation therapy.  CT images were obtained.  Surface markings were placed.  The CT images were loaded into the planning software.  Then the target and avoidance structures were contoured.  Treatment planning then occurred.  The radiation prescription was entered and confirmed.  Then, I designed and supervised the construction of a total of 3 medically necessary complex treatment devices.  I have requested : Isodose Plan.  I have ordered:dose calc.  PLAN:  The patient will receive 35 Gy in 14 fractions.  ________________________________ -----------------------------------  Billie Lade, PhD, MD

## 2013-06-18 ENCOUNTER — Ambulatory Visit
Admission: RE | Admit: 2013-06-18 | Discharge: 2013-06-18 | Disposition: A | Discharge status: Home / Self Care | Payer: Medicare HMO | Source: Ambulatory Visit | Attending: Radiation Oncology | Admitting: Radiation Oncology

## 2013-06-18 NOTE — Addendum Note (Signed)
Encounter addended by: Jeralyn Nolden Mintz Sanora Cunanan, RN on: 06/18/2013  7:24 PM<BR>     Documentation filed: Charges VN

## 2013-06-21 ENCOUNTER — Ambulatory Visit
Admission: RE | Admit: 2013-06-21 | Discharge: 2013-06-21 | Disposition: A | Discharge status: Home / Self Care | Payer: Medicare HMO | Source: Ambulatory Visit | Attending: Radiation Oncology | Admitting: Radiation Oncology

## 2013-06-21 ENCOUNTER — Other Ambulatory Visit: Payer: Medicare Other

## 2013-06-22 ENCOUNTER — Encounter: Payer: Self-pay | Admitting: Radiation Oncology

## 2013-06-22 ENCOUNTER — Ambulatory Visit
Admission: RE | Admit: 2013-06-22 | Discharge: 2013-06-22 | Disposition: A | Discharge status: Home / Self Care | Payer: Medicare HMO | Source: Ambulatory Visit | Attending: Radiation Oncology | Admitting: Radiation Oncology

## 2013-06-22 VITALS — BP 141/76 | HR 63 | Resp 16

## 2013-06-22 DIAGNOSIS — C9000 Multiple myeloma not having achieved remission: Secondary | ICD-10-CM

## 2013-06-22 NOTE — Progress Notes (Signed)
[facility]    Radiation Oncology [address] [place]     Maryln Gottron, M.D. [place], [place] [address]               Billie Lade, M.D., Ph.D. Phone: [phone]      Molli Hazard A. Kathrynn Running, M.D. Fax: [phone]      Radene Gunning, M.D., Ph.D.         Lurline Hare, M.D.         Grayland Jack, M.D Weekly Treatment Management Note  Name: Christian Burns     MRN: [id number]        CSN: [id number] Date: 06/22/2013      DOB: 09-11-1929  CC: [name], MD         [name]    Status: Outpatient  Diagnosis: The encounter diagnosis was Myeloma.  Current Dose: 10 Gy  Current Fraction: 4  Planned Dose: 35 Gy  Narrative: Christian Burns was seen today for weekly treatment management. The chart was checked and port films  were reviewed. He is tolerating his radiation therapy well. He denies any nausea which he is quite happy about. He denies any bowel problems. He denies any significant pain in the back region at this time. He has some paresthesias in his hands and feet but no weakness in his lower extremities. He does notice that he is quite emotional may be related to his steroid prescription.  Sulfa antibiotics Current Outpatient Prescriptions  Medication Sig Dispense Refill  . aspirin EC 81 MG tablet Take 1 tablet (81 mg total) by mouth daily.  30 tablet  12  . calcium carbonate 200 MG capsule Take 250 mg by mouth daily.      . cholecalciferol (VITAMIN D) 1000 UNITS tablet Take 50,000 Units by mouth every 30 (thirty) days.       . Cyanocobalamin (VITAMIN B 12 PO) Take 1,000 mg by mouth every morning.      Marland Kitchen dexamethasone (DECADRON) 4 MG tablet 4 mg 3 (three) times daily. 2 po TID      . Multiple Vitamins-Minerals (MULTIVITAL PO) Take by mouth every morning.      . Pyridoxine HCl (VITAMIN B-6) 250 MG tablet Take 1 tablet (250 mg total) by mouth daily.  30 tablet  6   No current facility-administered medications for this encounter.   Labs:  Lab Results   Component Value Date   WBC 2.4* 03/26/2013   HGB 11.8* 03/26/2013   HCT 33.6* 03/26/2013   MCV 94 03/26/2013   PLT 120* 03/26/2013   Lab Results  Component Value Date   CREATININE 1.28 06/16/2013   BUN 19 03/26/2013   NA 137 03/26/2013   K 4.3 03/26/2013   CL 100 03/26/2013   CO2 30 03/26/2013   Lab Results  Component Value Date   ALT 27 03/26/2013   AST 25 03/26/2013   BILITOT 2.20* 03/26/2013    Physical Examination:  blood pressure is 141/76 and his pulse is 63. His respiration is 16 and oxygen saturation is 100%.    Wt Readings from Last 3 Encounters:  06/17/13 146 lb 4.8 oz (66.361 kg)  03/26/13 148 lb (67.132 kg)  11/20/12 150 lb (68.04 kg)     Lungs - Normal respiratory effort, chest expands symmetrically. Lungs are clear to auscultation, no crackles or wheezes.  Heart has regular rhythm and rate  Abdomen is soft and non tender with normal bowel sounds  Assessment:  Patient tolerating treatments  well  Plan: Continue treatment per original radiation prescription

## 2013-06-22 NOTE — Progress Notes (Signed)
Denies pain at this time. Reports neuropathy in finger tips and feet. Denies weakness. Reports fatigue the last several days. Reports hiccups worse at night. Reports taking decadron 4 mg tid. Reports drinking ensure daily to supplement diet and help gain weight.

## 2013-06-23 ENCOUNTER — Ambulatory Visit
Admission: RE | Admit: 2013-06-23 | Discharge: 2013-06-23 | Disposition: A | Discharge status: Home / Self Care | Payer: Medicare HMO | Source: Ambulatory Visit | Attending: Radiation Oncology | Admitting: Radiation Oncology

## 2013-06-24 ENCOUNTER — Ambulatory Visit
Admission: RE | Admit: 2013-06-24 | Discharge: 2013-06-24 | Disposition: A | Discharge status: Home / Self Care | Payer: Medicare HMO | Source: Ambulatory Visit | Attending: Radiation Oncology | Admitting: Radiation Oncology

## 2013-06-25 ENCOUNTER — Ambulatory Visit (HOSPITAL_BASED_OUTPATIENT_CLINIC_OR_DEPARTMENT_OTHER): Payer: Medicare HMO | Admitting: Lab

## 2013-06-25 ENCOUNTER — Encounter: Payer: Self-pay | Admitting: *Deleted

## 2013-06-25 ENCOUNTER — Ambulatory Visit
Admission: RE | Admit: 2013-06-25 | Discharge: 2013-06-25 | Disposition: A | Discharge status: Home / Self Care | Payer: Medicare HMO | Source: Ambulatory Visit | Attending: Radiation Oncology | Admitting: Radiation Oncology

## 2013-06-25 ENCOUNTER — Ambulatory Visit (HOSPITAL_BASED_OUTPATIENT_CLINIC_OR_DEPARTMENT_OTHER)
Admission: RE | Admit: 2013-06-25 | Discharge: 2013-06-25 | Disposition: A | Discharge status: Home / Self Care | Payer: Medicare HMO | Source: Ambulatory Visit | Attending: Hematology & Oncology | Admitting: Hematology & Oncology

## 2013-06-25 ENCOUNTER — Ambulatory Visit (HOSPITAL_BASED_OUTPATIENT_CLINIC_OR_DEPARTMENT_OTHER): Payer: Medicare HMO | Admitting: Hematology & Oncology

## 2013-06-25 ENCOUNTER — Ambulatory Visit (HOSPITAL_BASED_OUTPATIENT_CLINIC_OR_DEPARTMENT_OTHER): Payer: Medicare HMO

## 2013-06-25 VITALS — BP 159/70 | HR 60 | Temp 97.6°F | Resp 18 | Ht 70.0 in | Wt 141.0 lb

## 2013-06-25 DIAGNOSIS — C9002 Multiple myeloma in relapse: Secondary | ICD-10-CM

## 2013-06-25 DIAGNOSIS — C9000 Multiple myeloma not having achieved remission: Secondary | ICD-10-CM

## 2013-06-25 LAB — CBC WITH DIFFERENTIAL (CANCER CENTER ONLY)
Eosinophils Absolute: 0 10*3/uL (ref 0.0–0.5)
LYMPH%: 1.3 % — ABNORMAL LOW (ref 14.0–48.0)
MCH: 32.6 pg (ref 28.0–33.4)
MCHC: 35 g/dL (ref 32.0–35.9)
MCV: 93 fL (ref 82–98)
MONO%: 7 % (ref 0.0–13.0)
Platelets: 136 10*3/uL — ABNORMAL LOW (ref 145–400)
RBC: 3.71 10*6/uL — ABNORMAL LOW (ref 4.20–5.70)
RDW: 15.1 % (ref 11.1–15.7)

## 2013-06-25 LAB — CMP (CANCER CENTER ONLY)
ALT(SGPT): 41 U/L (ref 10–47)
CO2: 26 mEq/L (ref 18–33)
Calcium: 8.3 mg/dL (ref 8.0–10.3)
Chloride: 103 mEq/L (ref 98–108)
Sodium: 134 mEq/L (ref 128–145)
Total Protein: 5.7 g/dL — ABNORMAL LOW (ref 6.4–8.1)

## 2013-06-25 MED ORDER — ZOLEDRONIC ACID 4 MG/5ML IV CONC
3.3000 mg | Freq: Once | INTRAVENOUS | Status: AC
  Administered 2013-06-25: 3.3 mg via INTRAVENOUS
  Filled 2013-06-25: qty 4.13

## 2013-06-25 MED ORDER — SODIUM CHLORIDE 0.9 % IJ SOLN
3.0000 mL | Freq: Once | INTRAMUSCULAR | Status: DC | PRN
  Filled 2013-06-25: qty 10

## 2013-06-25 MED ORDER — SODIUM CHLORIDE 0.9 % IV SOLN
Freq: Once | INTRAVENOUS | Status: AC
  Administered 2013-06-25: 13:00:00 via INTRAVENOUS

## 2013-06-25 NOTE — Progress Notes (Signed)
Notice sent to [facility] to cancel patients Revlimid.  According to Dr. Myna Hidalgo patient will not be receiving Revlimid anymore due to progression of disease

## 2013-06-25 NOTE — Patient Instructions (Signed)

## 2013-06-25 NOTE — Progress Notes (Signed)
This office note has been dictated.

## 2013-06-28 ENCOUNTER — Other Ambulatory Visit: Payer: Medicare Other | Admitting: Lab

## 2013-06-28 ENCOUNTER — Ambulatory Visit
Admission: RE | Admit: 2013-06-28 | Discharge: 2013-06-28 | Disposition: A | Discharge status: Home / Self Care | Payer: Medicare HMO | Source: Ambulatory Visit | Attending: Radiation Oncology | Admitting: Radiation Oncology

## 2013-06-28 DIAGNOSIS — C9002 Multiple myeloma in relapse: Secondary | ICD-10-CM

## 2013-06-29 ENCOUNTER — Ambulatory Visit
Admission: RE | Admit: 2013-06-29 | Discharge: 2013-06-29 | Disposition: A | Discharge status: Home / Self Care | Payer: Medicare HMO | Source: Ambulatory Visit | Attending: Radiation Oncology | Admitting: Radiation Oncology

## 2013-06-29 ENCOUNTER — Encounter: Payer: Self-pay | Admitting: Radiation Oncology

## 2013-06-29 VITALS — BP 165/77 | HR 67 | Temp 97.4°F | Resp 20 | Wt 142.1 lb

## 2013-06-29 DIAGNOSIS — C9000 Multiple myeloma not having achieved remission: Secondary | ICD-10-CM

## 2013-06-29 NOTE — Progress Notes (Signed)
Weekly rad txs lumbar spine 9 completed so far, no c/o pain, nausea, just fatigued, still taking decadron 4mg  2 po TID, , 100% room air sats, appetite good,  1:27 PM

## 2013-06-29 NOTE — Progress Notes (Signed)
[facility]    Radiation Oncology [address] [place]     Maryln Gottron, M.D. [place], [place] [address]               Billie Lade, M.D., Ph.D. Phone: [phone]      Molli Hazard A. Kathrynn Running, M.D. Fax: [phone]      Radene Gunning, M.D., Ph.D.         Lurline Hare, M.D.         Grayland Jack, M.D Weekly Treatment Management Note  Name: Christian Burns     MRN: [id number]        CSN: [id number] Date: 06/29/2013      DOB: [birth year]  CC: Christian Fillers, MD         Christian Burns    Status: Outpatient  Diagnosis: The encounter diagnosis was Myeloma.  Current Dose: 22.5 Gy  Current Fraction: 9  Planned Dose: 35 Gy  Narrative: Christian Burns was seen today for weekly treatment management. The chart was checked and port films  were reviewed. He is tolerating his treatments well.  He denies any nausea. He denies any pain at this time. He does have some mild loosening of his bowels and mild fatigue.  Sulfa antibiotics Current Outpatient Prescriptions  Medication Sig Dispense Refill  . aspirin EC 81 MG tablet Take 1 tablet (81 mg total) by mouth daily.  30 tablet  12  . calcium carbonate 200 MG capsule Take 250 mg by mouth daily.      . cholecalciferol (VITAMIN D) 1000 UNITS tablet Take 50,000 Units by mouth every 30 (thirty) days.       . Cyanocobalamin (VITAMIN B 12 PO) Take 1,000 mg by mouth every morning.      Marland Kitchen dexamethasone (DECADRON) 4 MG tablet 4 mg 3 (three) times daily. 2 po TID      . Multiple Vitamins-Minerals (MULTIVITAL PO) Take by mouth every morning.      . Pyridoxine HCl (VITAMIN B-6) 250 MG tablet Take 1 tablet (250 mg total) by mouth daily.  30 tablet  6   No current facility-administered medications for this encounter.   Labs:  Lab Results  Component Value Date   WBC 9.0 06/25/2013   HGB 12.1* 06/25/2013   HCT 34.6* 06/25/2013   MCV 93 06/25/2013   PLT 136* 06/25/2013   Lab Results  Component Value Date   CREATININE 1.1 06/25/2013   BUN 42* 06/25/2013   NA 134 06/25/2013   K 4.4 06/25/2013   CL 103 06/25/2013   CO2 26 06/25/2013   Lab Results  Component Value Date   ALT 41 06/25/2013   AST 19 06/25/2013   BILITOT 2.00* 06/25/2013    Physical Examination:  Filed Vitals:   06/29/13 1324  BP: 165/77  Pulse: 67  Temp: 97.4 F (36.3 C)  Resp: 20    Wt Readings from Last 3 Encounters:  06/29/13 142 lb 1.6 oz (64.456 kg)  06/25/13 141 lb (63.957 kg)  06/17/13 146 lb 4.8 oz (66.361 kg)     Lungs - Normal respiratory effort, chest expands symmetrically. Lungs are clear to auscultation, no crackles or wheezes.  Heart has regular rhythm and rate  Abdomen is soft and non tender with normal bowel sounds  Assessment:  Patient tolerating treatments well  Plan: Continue treatment per original radiation prescription

## 2013-06-30 ENCOUNTER — Ambulatory Visit
Admission: RE | Admit: 2013-06-30 | Discharge: 2013-06-30 | Disposition: A | Discharge status: Home / Self Care | Payer: Medicare HMO | Source: Ambulatory Visit | Attending: Radiation Oncology | Admitting: Radiation Oncology

## 2013-06-30 DIAGNOSIS — C9000 Multiple myeloma not having achieved remission: Secondary | ICD-10-CM

## 2013-06-30 LAB — UIFE/LIGHT CHAINS/TP QN, 24-HR UR
Beta, Urine: DETECTED — AB
Free Lambda Lt Chains,Ur: 0.72 mg/dL — ABNORMAL HIGH (ref 0.02–0.67)
Free Lt Chn Excr Rate: 91.3 mg/d
Gamma Globulin, Urine: DETECTED — AB
Volume, Urine: 1100 mL

## 2013-06-30 LAB — KAPPA/LAMBDA LIGHT CHAINS: Kappa free light chain: 1.03 mg/dL (ref 0.33–1.94)

## 2013-06-30 LAB — PROTEIN ELECTROPHORESIS, SERUM, WITH REFLEX
Albumin ELP: 56.1 % (ref 55.8–66.1)
Alpha-1-Globulin: 3.9 % (ref 2.9–4.9)
Beta 2: 4.4 % (ref 3.2–6.5)

## 2013-06-30 LAB — IGG, IGA, IGM: IgA: 187 mg/dL (ref 68–379)

## 2013-07-01 ENCOUNTER — Ambulatory Visit
Admission: RE | Admit: 2013-07-01 | Discharge: 2013-07-01 | Disposition: A | Discharge status: Home / Self Care | Payer: Medicare HMO | Source: Ambulatory Visit | Attending: Radiation Oncology | Admitting: Radiation Oncology

## 2013-07-01 DIAGNOSIS — C9000 Multiple myeloma not having achieved remission: Secondary | ICD-10-CM

## 2013-07-01 DIAGNOSIS — R972 Elevated prostate specific antigen [PSA]: Secondary | ICD-10-CM | POA: Insufficient documentation

## 2013-07-02 ENCOUNTER — Ambulatory Visit
Admission: RE | Admit: 2013-07-02 | Discharge: 2013-07-02 | Disposition: A | Discharge status: Home / Self Care | Payer: Medicare HMO | Source: Ambulatory Visit | Attending: Radiation Oncology | Admitting: Radiation Oncology

## 2013-07-03 NOTE — Progress Notes (Signed)
CC:   Christian Burns. Christian Burns, M.D.  DIAGNOSIS:  Recurrent IgG lambda myeloma.  CURRENT THERAPY: 1. Patient receiving radiation therapy to the lumbar spine. 2. Zometa 3.3 mg IV q.3 hours for 4 weeks.  INTERIM HISTORY:  Christian Burns comes in for followup.  Unfortunately, it looks like his myeloma has clearly recurred.  I was called by his family doctor a few weeks ago.  Christian Burns was complaining of some pelvic pain. He had a CT scan done which showed a large mass involving the L2 vertebral body.  We got an MRI on him.  This was done on November 5th.  MRI unfortunately showed a pathologic fracture of L2 on the right side.  There is tumor extending to the spinal canal with flattening of the thecal sac and likely impingement of the right L3 nerve root.  Bone marrow was heterogeneous throughout the lumbar spine, likely felt to be due to myeloma.  He has started radiation therapy.  He is on some steroids to help with edema.  When we last saw him back in August, his monoclonal spike was 0.82 g/dL. IgG level was 1550 mg/dL.  He says he is feeling a little bit better.  He is not hurting.  He has had Burns weakness in his legs.  His appetite has been doing good.  He has had a lot of "mood swings" because of the steroids that he is on.  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  His wife has a lot of health issues.  He has not had any problems with fever.  He has had Burns bleeding.  He has had Burns rashes.  Overall, his performance status is ECOG 1-2.  PHYSICAL EXAMINATION:  General:  This is an elderly, somewhat thin white gentleman, in Burns obvious distress.  Vital Signs:  Temperature of 97.6, pulse 60, respiratory rate 18, blood pressure 159/70.  Weight is 141 pounds.  Head and Neck:  Normocephalic, atraumatic skull.  There are Burns ocular or oral lesions.  There are Burns palpable cervical or supraclavicular lymph nodes.  Lungs:  Clear bilaterally.  Cardiac: Regular rate and rhythm with a normal S1 and  S2.  He has a 1/6 systolic ejection murmur.  Abdomen:  Soft.  He has good bowel sounds.  There is Burns fluid wave.  There is Burns palpable hepatosplenomegaly.  Back exam shows Burns tenderness over the spine, ribs, or hips.  He has some slight tenderness to the left lateral hip.  Extremities show some age-related muscle atrophy on his legs.  He has some age-related osteoarthritic changes in his joints.  Muscle strength is 4/5 bilaterally.  Skin exam shows Burns rashes, ecchymosis, or petechia.  Neurological exam shows Burns focal neurological deficits.  LABORATORY STUDIES:  White cell count is 9, hemoglobin 12.1, hematocrit 34.6, platelet count 136.  Protein is 5.7 with albumin of 2.6.  His calcium is 8.3.  BUN 42, creatinine 1.1.  IMPRESSION:  Christian Burns is an 79 year old gentleman with a long history of IgG lambda myeloma.  He actually has done incredibly well.  We really have not had to give him anything too aggressive for the myeloma.  I think we just had him on Revlimid and some Decadron.  Clearly, it looks like we are in a position where his myeloma is becoming more aggressive.  I think, we are going to have to do another restaging evaluation.  I want to get a 24-urine on him.  I want to do a bone  marrow test.  I set him up with a bone survey today.  I have not yet seen the results.  I think if we are going to treat him, we are going to have to utilize IV chemotherapy.  I think, we might have to consider Velcade for him.  Velcade with a possibility of  Cytoxan always could be considered.  I spent a good 45 minutes with him today.  I am just surprised that the myeloma does appear to have recurred.  We will plan to have the bone marrow test done right before, I think, Thanksgiving.  We will do it over at [facility].  I will plan to get him back in another 4 or 5 weeks.  He still has another couple weeks of radiation therapy to go.  We have to keep in mind that he is his  wife's primary caretaker and we have to make sure that however we treat him, we do not get him too weak so that he cannot care for her.    ______________________________ Josph Macho, M.D. PRE/MEDQ  D:  06/25/2013  T:  07/03/2013  Job:  [id number]

## 2013-07-05 ENCOUNTER — Encounter (HOSPITAL_COMMUNITY): Payer: Self-pay | Admitting: Pharmacy Technician

## 2013-07-05 ENCOUNTER — Ambulatory Visit
Admission: RE | Admit: 2013-07-05 | Discharge: 2013-07-05 | Disposition: A | Discharge status: Home / Self Care | Payer: Medicare HMO | Source: Ambulatory Visit | Attending: Radiation Oncology | Admitting: Radiation Oncology

## 2013-07-05 ENCOUNTER — Other Ambulatory Visit: Payer: Self-pay | Admitting: *Deleted

## 2013-07-05 DIAGNOSIS — C9000 Multiple myeloma not having achieved remission: Secondary | ICD-10-CM

## 2013-07-05 NOTE — Progress Notes (Signed)
Orders entered for BMBX and asp tomorrow for preparation. Dr Myna Hidalgo will enter sedation orders upon arrival.

## 2013-07-06 ENCOUNTER — Ambulatory Visit
Admission: RE | Admit: 2013-07-06 | Discharge: 2013-07-06 | Disposition: A | Discharge status: Home / Self Care | Payer: Medicare HMO | Source: Ambulatory Visit | Attending: Radiation Oncology | Admitting: Radiation Oncology

## 2013-07-06 ENCOUNTER — Ambulatory Visit (HOSPITAL_COMMUNITY)
Admission: RE | Admit: 2013-07-06 | Discharge: 2013-07-06 | Disposition: A | Discharge status: Home / Self Care | Payer: Medicare HMO | Source: Ambulatory Visit | Attending: Hematology & Oncology | Admitting: Hematology & Oncology

## 2013-07-06 ENCOUNTER — Encounter (HOSPITAL_COMMUNITY): Payer: Self-pay

## 2013-07-06 ENCOUNTER — Other Ambulatory Visit: Payer: Self-pay | Admitting: Hematology & Oncology

## 2013-07-06 VITALS — BP 150/73 | HR 56 | Temp 97.4°F | Resp 16

## 2013-07-06 VITALS — BP 163/71 | HR 54 | Temp 94.6°F | Ht 70.0 in | Wt 143.4 lb

## 2013-07-06 DIAGNOSIS — D649 Anemia, unspecified: Secondary | ICD-10-CM | POA: Insufficient documentation

## 2013-07-06 DIAGNOSIS — C9000 Multiple myeloma not having achieved remission: Secondary | ICD-10-CM

## 2013-07-06 DIAGNOSIS — C9002 Multiple myeloma in relapse: Secondary | ICD-10-CM

## 2013-07-06 DIAGNOSIS — D696 Thrombocytopenia, unspecified: Secondary | ICD-10-CM | POA: Insufficient documentation

## 2013-07-06 HISTORY — DX: Multiple myeloma not having achieved remission: C90.00

## 2013-07-06 LAB — CBC
Hemoglobin: 11.2 g/dL — ABNORMAL LOW (ref 13.0–17.0)
MCH: 32.9 pg (ref 26.0–34.0)
Platelets: 105 10*3/uL — ABNORMAL LOW (ref 150–400)
RBC: 3.4 MIL/uL — ABNORMAL LOW (ref 4.22–5.81)
RDW: 15.1 % (ref 11.5–15.5)
WBC: 5.8 10*3/uL (ref 4.0–10.5)

## 2013-07-06 LAB — BONE MARROW EXAM

## 2013-07-06 MED ORDER — SODIUM CHLORIDE 0.9 % IV SOLN
Freq: Once | INTRAVENOUS | Status: AC
  Administered 2013-07-06: 08:00:00 via INTRAVENOUS

## 2013-07-06 MED ORDER — MIDAZOLAM HCL 10 MG/2ML IJ SOLN
INTRAMUSCULAR | Status: AC
  Filled 2013-07-06: qty 2

## 2013-07-06 MED ORDER — MEPERIDINE HCL 50 MG/ML IJ SOLN
25.0000 mg | Freq: Once | INTRAMUSCULAR | Status: DC
  Filled 2013-07-06: qty 1

## 2013-07-06 MED ORDER — RADIAPLEXRX EX GEL
Freq: Once | CUTANEOUS | Status: AC
  Administered 2013-07-06: 14:00:00 via TOPICAL

## 2013-07-06 MED ORDER — MEPERIDINE HCL 25 MG/ML IJ SOLN
INTRAMUSCULAR | Status: AC | PRN
  Administered 2013-07-06: 25 mg via INTRAVENOUS

## 2013-07-06 MED ORDER — MIDAZOLAM HCL 10 MG/2ML IJ SOLN: 5.0000 mg | Freq: Once | INTRAMUSCULAR | Status: DC

## 2013-07-06 MED ORDER — MIDAZOLAM HCL 5 MG/5ML IJ SOLN
INTRAMUSCULAR | Status: AC | PRN
  Administered 2013-07-06 (×2): 1 mg via INTRAVENOUS

## 2013-07-06 NOTE — Addendum Note (Signed)
Encounter addended by: Eduardo Osier, RN on: 07/06/2013  2:05 PM<BR>     Documentation filed: Inpatient MAR, Orders

## 2013-07-06 NOTE — ED Notes (Signed)
Family updated as to patient's status. Son back in room with patient

## 2013-07-06 NOTE — Progress Notes (Signed)
Patient tolerated bone marrow biopsy well. VSS. Gauze CDI. Verbalized instructions of d/c instructions. RN took patient and his son to radiation oncology at 1000 post bone marrow biopsy for his radiation treatment.

## 2013-07-06 NOTE — Progress Notes (Signed)
Christian Burns here for final treatment visit.  He has had 14 fractions to his lumbar spine.  He denies pain.  He is currently taking decadron 4 mg po bid.  He does have fatigue.  He denies nausea, bladder changes and diarrhea.  He has redness on his lower back.  He was given the radiation therapy and you book and discussed skin changes and bladder changes.  He was given radiaplex gel and instructed him to use it twice a day.

## 2013-07-06 NOTE — Progress Notes (Signed)
[facility]    Radiation Oncology [address] [place]     Maryln Gottron, M.D. [place], [place] [address]               Billie Lade, M.D., Ph.D. Phone: [phone]      Molli Hazard A. Kathrynn Running, M.D. Fax: [phone]      Radene Gunning, M.D., Ph.D.         Lurline Hare, M.D.         Grayland Jack, M.D Weekly Treatment Management Note  Name: Christian Burns     MRN: [id number]        CSN: [id number] Date: 07/06/2013      DOB: January 15, 1930  CC: [name], MD         [name]    Status: Outpatient  Diagnosis: The encounter diagnosis was Myeloma.  Current Dose: 35 Gy  Current Fraction: 14  Planned Dose: 35 Gy  Narrative: Christian Burns was seen today for weekly treatment management. The chart was checked and CBCT  were reviewed. Other than fatigue the patient has tolerated his treatments well. He denies any pain this time nausea or bowel problems. He will continue on a Decadron taper.  He did undergo a bone marrow aspiration and biopsy earlier today and tolerated this well.  Sulfa antibiotics  Current Outpatient Prescriptions  Medication Sig Dispense Refill  . aspirin EC 81 MG tablet Take 81 mg by mouth daily.      . calcium carbonate 200 MG capsule Take 250 mg by mouth daily.      . cholecalciferol (VITAMIN D) 1000 UNITS tablet Take 50,000 Units by mouth every 30 (thirty) days.       . Cyanocobalamin (VITAMIN B 12 PO) Take 1,000 mg by mouth every morning.      Marland Kitchen dexamethasone (DECADRON) 4 MG tablet Take 4 mg by mouth 2 (two) times daily. 2 po TID      . Multiple Vitamins-Minerals (MULTIVITAL PO) Take 1 tablet by mouth every morning.       . Pyridoxine HCl (VITAMIN B-6) 250 MG tablet Take 250 mg by mouth daily.       No current facility-administered medications for this encounter.   Facility-Administered Medications Ordered in Other Encounters  Medication Dose Route Frequency Provider Last Rate Last Dose  . meperidine (DEMEROL) injection 25 mg  25 mg  Intravenous Once Josph Macho, MD      . midazolam (VERSED) 10 MG/2ML injection           . midazolam (VERSED) injection 5 mg  5 mg Intravenous Once Josph Macho, MD       Labs:  Lab Results  Component Value Date   WBC 5.8 07/06/2013   HGB 11.2* 07/06/2013   HCT 31.6* 07/06/2013   MCV 92.9 07/06/2013   PLT 105* 07/06/2013   Lab Results  Component Value Date   CREATININE 1.1 06/25/2013   BUN 42* 06/25/2013   NA 134 06/25/2013   K 4.4 06/25/2013   CL 103 06/25/2013   CO2 26 06/25/2013   Lab Results  Component Value Date   ALT 41 06/25/2013   AST 19 06/25/2013   BILITOT 2.00* 06/25/2013    Physical Examination:  Filed Vitals:   07/06/13 1021  BP: 163/71  Pulse: 54  Temp: 94.6 F (34.8 C)    Wt Readings from Last 3 Encounters:  07/06/13 143 lb 6.4 oz (65.046 kg)  06/29/13 142 lb 1.6 oz (64.456  kg)  06/25/13 141 lb (63.957 kg)     Lungs - Normal respiratory effort, chest expands symmetrically. Lungs are clear to auscultation, no crackles or wheezes.  Heart has regular rhythm and rate  Abdomen is soft and non tender with normal bowel sounds  Assessment:  Patient tolerated treatments well  Plan: Routine followup in one month.

## 2013-07-06 NOTE — Progress Notes (Signed)
This office note has been dictated.

## 2013-07-06 NOTE — ED Notes (Signed)
Patient is resting comfortably. 

## 2013-07-06 NOTE — ED Notes (Signed)
MD at bedside. 

## 2013-07-17 NOTE — Procedures (Signed)
INTERIM HISTORY:  [name] was part of the short-stay unit at [facility].  He was there for a bone marrow biopsy and aspirate to assess for recurrent myeloma.  He had an IV placed peripherally.  We did the appropriate time-out procedure on him.  His Mallampati score was 2.  ASA class was 2.  We did the informed consent.  PROCEDURE:  He was then placed onto his right side.  He received a total of 2.5 mg of Versed and 25 mg of Demerol for IV sedation.  The left posterior iliac crest region was prepped and draped in sterile fashion.  8 mL of 2% lidocaine was infiltrated under the skin down to the periosteum.  An #11 scalpel was used to make an incision into the skin.  We obtained 2 excellent aspirates.  This was done without difficulty.  Then we used a Jamshidi bone marrow biopsy needle.  We went through the same incision as the bone marrow aspirate.  We obtained an excellent bone marrow biopsy core.  We then cleaned and dressed the procedure site sterilely.  [name] tolerated the procedure well.  He had no complications from the procedure.  I spoke to his son afterwards.  I told his son that hopefully we would have the bone marrow results later on this week, and that I would call [name] with the results.    ______________________________ Josph Macho, M.D. PRE/MEDQ  D:  07/06/2013  T:  07/17/2013  Job:  [id number]

## 2013-07-19 LAB — TISSUE HYBRIDIZATION (BONE MARROW)-NCBH

## 2013-07-19 LAB — CHROMOSOME ANALYSIS, BONE MARROW

## 2013-07-20 ENCOUNTER — Telehealth: Payer: Self-pay | Admitting: Hematology & Oncology

## 2013-07-20 NOTE — Telephone Encounter (Signed)
Received message from Pt. He wants to move 12-19 to 12-18. I left message with wife that we don't have anything for 12-18 but per MD it is ok to schedule 12-26 appointment. Wife said she will have pt call and leave message when he gets back today. I have scheduled 12-26 in case he wants to do that.

## 2013-07-21 ENCOUNTER — Telehealth: Payer: Self-pay | Admitting: Hematology & Oncology

## 2013-07-21 ENCOUNTER — Encounter (HOSPITAL_COMMUNITY): Payer: Self-pay

## 2013-07-21 NOTE — Telephone Encounter (Signed)
Pt called back took 12-26 appointment. He then said he needed to see MD sooner because of weight loss. I transferred him to RN voice mail for triage. Nikki RN aware

## 2013-07-25 ENCOUNTER — Encounter: Payer: Self-pay | Admitting: Radiation Oncology

## 2013-07-25 NOTE — Progress Notes (Signed)
  Radiation Oncology         [phone] ________________________________  Name: Christian Burns MRN: [id number]  Date: 07/25/2013  DOB: 1930/07/21  End of Treatment Note  Diagnosis:   Progressive multiple myeloma     Indication for treatment:  Significant disease in the upper lumbar spine with pathologic fracture and nerve root impingement       Radiation treatment dates:   November 6 through November 25  Site/dose:   Upper lumbar spine, 35 gray in 14 fractions  Beams/energy:   AP/PA, 15 MV photons  Narrative: The patient tolerated radiation treatment relatively well.   He did experience some fatigue with this treatment but no significant problems with nausea or bowel issues.  He complained of being emotional with his steroid medication  Plan: The patient has completed radiation treatment. The patient will return to radiation oncology clinic for routine followup in one month. I advised them to call or return sooner if they have any questions or concerns related to their recovery or treatment.  -----------------------------------  Billie Lade, PhD, MD

## 2013-07-27 ENCOUNTER — Encounter: Payer: Self-pay | Admitting: Hematology & Oncology

## 2013-07-30 ENCOUNTER — Ambulatory Visit: Payer: Medicare HMO

## 2013-07-30 ENCOUNTER — Other Ambulatory Visit: Payer: Medicare HMO | Admitting: Lab

## 2013-07-30 ENCOUNTER — Ambulatory Visit: Payer: Medicare HMO | Admitting: Hematology & Oncology

## 2013-08-04 ENCOUNTER — Other Ambulatory Visit: Payer: Self-pay | Admitting: Nurse Practitioner

## 2013-08-04 DIAGNOSIS — C9000 Multiple myeloma not having achieved remission: Secondary | ICD-10-CM

## 2013-08-06 ENCOUNTER — Encounter: Payer: Self-pay | Admitting: Nurse Practitioner

## 2013-08-06 ENCOUNTER — Ambulatory Visit (HOSPITAL_BASED_OUTPATIENT_CLINIC_OR_DEPARTMENT_OTHER)
Admission: RE | Admit: 2013-08-06 | Discharge: 2013-08-06 | Disposition: A | Discharge status: Home / Self Care | Payer: Medicare HMO | Source: Ambulatory Visit | Attending: Hematology & Oncology | Admitting: Hematology & Oncology

## 2013-08-06 ENCOUNTER — Other Ambulatory Visit: Payer: Medicare Other | Admitting: Lab

## 2013-08-06 ENCOUNTER — Ambulatory Visit (HOSPITAL_BASED_OUTPATIENT_CLINIC_OR_DEPARTMENT_OTHER): Payer: Medicare HMO | Admitting: Hematology & Oncology

## 2013-08-06 ENCOUNTER — Other Ambulatory Visit (HOSPITAL_BASED_OUTPATIENT_CLINIC_OR_DEPARTMENT_OTHER): Payer: Medicare HMO

## 2013-08-06 ENCOUNTER — Ambulatory Visit (HOSPITAL_BASED_OUTPATIENT_CLINIC_OR_DEPARTMENT_OTHER): Payer: Medicare HMO

## 2013-08-06 VITALS — BP 151/89 | HR 67 | Temp 97.0°F | Ht 71.0 in | Wt 143.0 lb

## 2013-08-06 DIAGNOSIS — C9000 Multiple myeloma not having achieved remission: Secondary | ICD-10-CM

## 2013-08-06 DIAGNOSIS — E875 Hyperkalemia: Secondary | ICD-10-CM

## 2013-08-06 DIAGNOSIS — M4310 Spondylolisthesis, site unspecified: Secondary | ICD-10-CM | POA: Insufficient documentation

## 2013-08-06 LAB — BASIC METABOLIC PANEL - CANCER CENTER ONLY
BUN, Bld: 29 mg/dL — ABNORMAL HIGH (ref 7–22)
CO2: 33 mEq/L (ref 18–33)
Calcium: 9.8 mg/dL (ref 8.0–10.3)
Potassium: 5.6 mEq/L — ABNORMAL HIGH (ref 3.3–4.7)
Sodium: 144 mEq/L (ref 128–145)

## 2013-08-06 LAB — CBC WITH DIFFERENTIAL (CANCER CENTER ONLY)
BASO#: 0 10*3/uL (ref 0.0–0.2)
BASO%: 0.3 % (ref 0.0–2.0)
Eosinophils Absolute: 0.1 10*3/uL (ref 0.0–0.5)
LYMPH#: 0.2 10*3/uL — ABNORMAL LOW (ref 0.9–3.3)
LYMPH%: 3.8 % — ABNORMAL LOW (ref 14.0–48.0)
MCH: 32.7 pg (ref 28.0–33.4)
MCV: 97 fL (ref 82–98)
MONO#: 0.5 10*3/uL (ref 0.1–0.9)
NEUT#: 5.5 10*3/uL (ref 1.5–6.5)
Platelets: 114 10*3/uL — ABNORMAL LOW (ref 145–400)
RBC: 3.03 10*6/uL — ABNORMAL LOW (ref 4.20–5.70)
RDW: 16 % — ABNORMAL HIGH (ref 11.1–15.7)
WBC: 6.4 10*3/uL (ref 4.0–10.0)

## 2013-08-06 MED ORDER — SODIUM CHLORIDE 0.9 % IV SOLN
Freq: Once | INTRAVENOUS | Status: AC
  Administered 2013-08-06: 09:00:00 via INTRAVENOUS

## 2013-08-06 MED ORDER — ZOLEDRONIC ACID 4 MG/5ML IV CONC
3.3000 mg | Freq: Once | INTRAVENOUS | Status: AC
  Administered 2013-08-06: 3.3 mg via INTRAVENOUS
  Filled 2013-08-06: qty 4.13

## 2013-08-06 NOTE — Patient Instructions (Signed)

## 2013-08-06 NOTE — Progress Notes (Signed)
Pt enrolled in [place], approval fax received. Pt was given a copy of all paperwork and educated on medication. RX faxed  And pt is aware that he may receive a shipping phone call. Verbalized understanding and appreciation.

## 2013-08-11 NOTE — Progress Notes (Signed)
This office note has been dictated.

## 2013-08-12 NOTE — Progress Notes (Signed)
CC:   Christian Burns. Christian Burns, M.D.  DIAGNOSIS:  Recurrent IgG lambda myeloma.  CURRENT THERAPY: 1. The patient to start pomalidomide 3 mg p.o. daily (3 weeks on/1     week off). 2. Zometa 3.3 mg IV q. 3-4 weeks. 3. The patient is status post radiation therapy to the lumbar spine.  INTERIM HISTORY:  Christian Burns comes in for followup.  He looks like that he developed recurrent disease.  This was despite the fact that his myeloma studies all looked real good.  When he was found to have this recurrence, we went ahead and did a bone marrow biopsy on him.  This was done on November 25th.  The pathology report ([id number]), showed a normocellular marrow with erythroid hyperplasia.  He had 4% plasma cells.  We got cytogenetics on the bone marrow.  This was normal without any evidence of chromosomal abnormalities.  We did get a bone survey on him.  We did this the day we saw him actually.  Surprising to note, the bone survey really do not show much bone involvement.  He has some stable compression deformities at T8 and L2.  He feels well.  He does not have any further pain in the pelvis.  The radiation certainly helped him out.  Of note, we also did a 24-hour urine on him.  This showed 91 mg per day of kappa light chain.  This is elevated as previously.  His appetite is doing better.  He gained a little weight.  I think some of this might be from the Decadron that he was taking.  He was on, I think 4 mg twice a day.  I told him to go down to 2 mg twice a day.  He has had no diarrhea.  He has had no rashes.  He has had no fevers, sweats, or chills.  Currently, his performance status is ECOG 2.  PHYSICAL EXAMINATION:  General:  This is a thin, white gentleman in no obvious distress.  Vital Signs:  Temperature of 97, pulse 67, respiratory rate 20, blood pressure 151/89, weight is 143 pounds.  Head and Neck:  Normocephalic, atraumatic skull.  There are no ocular or oral lesions.  He has no  palpable cervical or supraclavicular lymph nodes. Lungs:  Clear bilaterally.  Cardiac:  Regular rate and rhythm with a normal S1 and S2.  There are no murmurs, rubs, or bruits.  Abdomen: Soft.  He has good bowel sounds.  There is no palpable abdominal mass. There is no palpable hepatosplenomegaly.  Back:  No tenderness over the spine, ribs, or hips.  Extremities:  No clubbing, cyanosis, or edema. He has good strength in his legs.  He has good range motion of the joints.  Some age-related osteoarthritic changes in his joints. Neurologic:  No focal neurological deficit.  LABORATORY STUDIES:  White cell count is 6.4, hemoglobin 9.9, hematocrit 29.3, platelet count 114,000.  Sodium 144, potassium 5.6.  BUN 29, creatinine 1.3.  IMPRESSION:  Christian Burns is an 80 year old gentleman.  He has a long history of IgG kappa myeloma.  We have only had him on Revlimid.  Again, I am just surprised that he had this kind of progression despite his myeloma studies looking pretty much normal to improved.  Again, the only thing that we found that was, higher was his kappa urinary secretion.  We will get him on a pomalidomide.  Again, I think this would be reasonable for him.  He has his wife that is  somewhat feeble and he really needs to take care of her.  We will go ahead with the Zometa today.  His potassium is a little on the high side.  I think we can just watch this for right now.  His kidney function has been doing well.  We will repeat his scans so that we can see how they look.    ______________________________ [name], M.D. PRE/MEDQ  D:  08/11/2013  T:  08/12/2013  Job:  [id number]

## 2013-08-13 ENCOUNTER — Encounter: Payer: Self-pay | Admitting: Oncology

## 2013-08-18 ENCOUNTER — Other Ambulatory Visit: Payer: Self-pay | Admitting: Hematology & Oncology

## 2013-08-18 ENCOUNTER — Ambulatory Visit (HOSPITAL_COMMUNITY)
Admission: RE | Admit: 2013-08-18 | Discharge: 2013-08-18 | Disposition: A | Discharge status: Home / Self Care | Payer: Medicare HMO | Source: Ambulatory Visit | Attending: Hematology & Oncology | Admitting: Hematology & Oncology

## 2013-08-18 DIAGNOSIS — Q619 Cystic kidney disease, unspecified: Secondary | ICD-10-CM | POA: Insufficient documentation

## 2013-08-18 DIAGNOSIS — C9000 Multiple myeloma not having achieved remission: Secondary | ICD-10-CM

## 2013-08-18 DIAGNOSIS — M47817 Spondylosis without myelopathy or radiculopathy, lumbosacral region: Secondary | ICD-10-CM | POA: Insufficient documentation

## 2013-08-18 DIAGNOSIS — M47814 Spondylosis without myelopathy or radiculopathy, thoracic region: Secondary | ICD-10-CM | POA: Insufficient documentation

## 2013-08-18 DIAGNOSIS — M545 Low back pain, unspecified: Secondary | ICD-10-CM | POA: Insufficient documentation

## 2013-08-18 DIAGNOSIS — M8448XA Pathological fracture, other site, initial encounter for fracture: Secondary | ICD-10-CM | POA: Insufficient documentation

## 2013-08-18 DIAGNOSIS — M899 Disorder of bone, unspecified: Secondary | ICD-10-CM | POA: Insufficient documentation

## 2013-08-18 DIAGNOSIS — R29898 Other symptoms and signs involving the musculoskeletal system: Secondary | ICD-10-CM | POA: Insufficient documentation

## 2013-08-18 DIAGNOSIS — M949 Disorder of cartilage, unspecified: Principal | ICD-10-CM

## 2013-08-18 MED ORDER — GADOBENATE DIMEGLUMINE 529 MG/ML IV SOLN
15.0000 mL | Freq: Once | INTRAVENOUS | Status: AC | PRN
  Administered 2013-08-18: 13 mL via INTRAVENOUS

## 2013-08-19 ENCOUNTER — Ambulatory Visit
Admission: RE | Admit: 2013-08-19 | Discharge: 2013-08-19 | Disposition: A | Discharge status: Home / Self Care | Payer: Medicare HMO | Source: Ambulatory Visit | Attending: Radiation Oncology | Admitting: Radiation Oncology

## 2013-08-19 ENCOUNTER — Encounter: Payer: Self-pay | Admitting: Radiation Oncology

## 2013-08-19 VITALS — BP 153/78 | HR 78 | Temp 97.8°F | Ht 71.0 in | Wt 149.4 lb

## 2013-08-19 DIAGNOSIS — C9000 Multiple myeloma not having achieved remission: Secondary | ICD-10-CM

## 2013-08-19 NOTE — Progress Notes (Signed)
Carmelina Paddock here for follow up after treatment to his upper lumbar spine.  He denies pain and nausea.  He does report 3-4 loose stools and is concerned because he has bowel movements when he is urinating.  He reports that he has a hernia in his right groin that is getting larger.  He reports tingling in his fingertips and when writing his index finger goes underneath his other fingers.  He also reports numbness in both his lower legs.  He recently started pomalyst.  He is also taking 4 mg of decadron daily.  He says he was weak after treatment and lost 10 lbs.  He has regained the weight and is not as fatigued.

## 2013-08-19 NOTE — Progress Notes (Signed)
Radiation Oncology         [phone] ________________________________  Name: Christian Burns MRN: [id number]  Date: 08/19/2013  DOB: 02-Apr-1930  Follow-Up Visit Note  CC: Christian Lopes, MD  Christian Napoleon, MD  Diagnosis:   Multiple myeloma  Interval Since Last Radiation:  6  weeks  Narrative:  The patient returns today for routine follow-up.  He is doing recently well. He does have some increased bowel frequency particularly with urination. He denies any stool incontinence. He denies any lower extremity weakness or problems with urinary retention. He denies any pain in his back region this time. He has noticed his inguinal hernia to be slightly larger but causing no symptoms.  Yesterday he underwent MRIs of the cervical thoracic and lumbar spine. There were no new issues in the disease in the upper lumbar spine which was treated with radiation was stable                              ALLERGIES:  is allergic to sulfa antibiotics.  Meds: Current Outpatient Prescriptions  Medication Sig Dispense Refill  . aspirin EC 81 MG tablet Take 81 mg by mouth daily.      . calcium carbonate 200 MG capsule Take 250 mg by mouth daily.      . cholecalciferol (VITAMIN D) 1000 UNITS tablet Take 50,000 Units by mouth every 30 (thirty) days.       . Cyanocobalamin (VITAMIN B 12 PO) Take 1,000 mg by mouth every morning.      . Multiple Vitamins-Minerals (MULTIVITAL PO) Take 1 tablet by mouth every morning.       . pomalidomide (POMALYST) 3 MG capsule Take 3 mg by mouth daily. Take with water on days 1-21. Repeat every 28 days. Auth# [id number]      . Pyridoxine HCl (VITAMIN B-6) 250 MG tablet Take 250 mg by mouth daily.      Marland Kitchen dexamethasone (DECADRON) 4 MG tablet Take 4 mg by mouth 2 (two) times daily. 2 po TID       No current facility-administered medications for this encounter.    Physical Findings: The patient is in no acute distress. Patient is alert and oriented.  height is _0  (1.803 m)  and weight is 149 lb 6.4 oz (67.767 kg). His temperature is 97.8 F (36.6 C). His blood pressure is 153/78 and his pulse is 78. His oxygen saturation is 96%. .  The lungs are clear. The heart has a regular rhythm and rate. The abdomen is soft and nontender with normal bowel sounds. Patient has a easily reducible hernia in the right groin area. This disappears completely when the patient is lying flat.  Motor strength is 5 out of 5 in the proximal and distal muscle groups of the lower extremities.  Lab Findings: Lab Results  Component Value Date   WBC 6.4 08/06/2013   HGB 9.9* 08/06/2013   HCT 29.3* 08/06/2013   MCV 97 08/06/2013   PLT 114* 08/06/2013      Radiographic Findings: Mr Cervical Spine W Wo Contrast  08/18/2013   CLINICAL DATA:  Bilateral extremity weakness with foot numbness and mid to low back pain. Multiple myeloma recurrence.  EXAM: MRI CERVICAL, THORACIC AND LUMBAR SPINE WITHOUT AND WITH CONTRAST  TECHNIQUE: Multiplanar and multiecho pulse sequences of the cervical spine, to include the craniocervical junction and cervicothoracic junction, and thoracic and lumbar spine, were obtained without and with  intravenous contrast.  CONTRAST:  42m MULTIHANCE GADOBENATE DIMEGLUMINE 529 MG/ML IV SOLN  COMPARISON:  DG BONE SURVEY MET dated 08/06/2013; MR L SPINE WO/W CM dated 06/16/2013; CT ABD/PELVIS W CM dated 06/08/2013; DG CHEST 2 VIEW dated 09/21/2008  FINDINGS: MRI CERVICAL SPINE FINDINGS  There is no evidence of lytic lesion, pathologic fracture or abnormal marrow enhancement within the cervical spine. The alignment is near anatomic with a mild scoliosis. There is spondylosis with endplate degeneration and posterior osteophytes.  The craniocervical junction appears normal. The cervical cord is normal in signal and caliber. There is no abnormal intradural enhancement. The vertebral arteries are patent bilaterally, dominant on the left.  Mild central stenosis is present at the C4-5 and C5-6  levels due to spondylosis. There is no cord deformity. Uncinate spurring contributes to mild foraminal stenosis, greatest on the right at C4-5 and C5-6.  MRI THORACIC SPINE FINDINGS  There is a mild convex right scoliosis. There is a chronic T8 compression deformity with approximately 75% loss of vertebral body height. This demonstrates mild residual T2 hyperintensity and enhancement, although is unchanged from the prior radiographs. No acute fractures are demonstrated.  A 1.5 cm lesion in the left aspect of the T10 vertebral body demonstrates T2 hyperintensity, intermediate T1 signal and enhancement. Although not entirely specific, this may reflect a hemangioma. There are tiny nonspecific lesions within the T11 and T12 vertebral bodies.  The thoracic cord is normal in signal and caliber. There is no abnormal intradural enhancement.  There is minimal thoracic spondylosis. No cord deformity or foraminal compromise is demonstrated.  Hepatic and left renal cysts are grossly stable.  MRI LUMBAR SPINE FINDINGS  The infiltrating and slightly expansile mass throughout the L2 vertebral body and right pedicle is unchanged. This measures up to 4.9 x 3.9 cm transverse and shows diffuse enhancement following contrast. The associated pathologic fracture is unchanged.  No other suspicious focal marrow lesions or pathologic fractures are identified. There are stable endplate degenerative changes at L3-4 with associated enhancement.  The conus medullaris extends to the L1-2 disc space level. There is no abnormal intradural enhancement.  Lumbar spondylosis appears stable with disc bulging and facet disease throughout the lumbar spine. There is stable mild right foraminal stenosis at L5-S1. The lateral recesses are mildly narrowed bilaterally at L3-4 and L4-5.  IMPRESSION: 1. 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 2. No definite new lesions identified. There are  small indeterminate lesions in the lower thoracic spine which may reflect hemangiomas. 3. Stable chronic T8 compression deformity. 4. Stable mild spondylosis.   Electronically Signed   By: BCamie PatienceM.D.   On: 08/18/2013 15:39   Mr Thoracic Spine W Wo Contrast  08/18/2013   CLINICAL DATA:  Bilateral extremity weakness with foot numbness and mid to low back pain. Multiple myeloma recurrence.  EXAM: MRI CERVICAL, THORACIC AND LUMBAR SPINE WITHOUT AND WITH CONTRAST  TECHNIQUE: Multiplanar and multiecho pulse sequences of the cervical spine, to include the craniocervical junction and cervicothoracic junction, and thoracic and lumbar spine, were obtained without and with intravenous contrast.  CONTRAST:  172mMULTIHANCE GADOBENATE DIMEGLUMINE 529 MG/ML IV SOLN  COMPARISON:  DG BONE SURVEY MET dated 08/06/2013; MR L SPINE WO/W CM dated 06/16/2013; CT ABD/PELVIS W CM dated 06/08/2013; DG CHEST 2 VIEW dated 09/21/2008  FINDINGS: MRI CERVICAL SPINE FINDINGS  There is no evidence of lytic lesion, pathologic fracture or abnormal marrow enhancement within the cervical spine. The alignment is  near anatomic with a mild scoliosis. There is spondylosis with endplate degeneration and posterior osteophytes.  The craniocervical junction appears normal. The cervical cord is normal in signal and caliber. There is no abnormal intradural enhancement. The vertebral arteries are patent bilaterally, dominant on the left.  Mild central stenosis is present at the C4-5 and C5-6 levels due to spondylosis. There is no cord deformity. Uncinate spurring contributes to mild foraminal stenosis, greatest on the right at C4-5 and C5-6.  MRI THORACIC SPINE FINDINGS  There is a mild convex right scoliosis. There is a chronic T8 compression deformity with approximately 75% loss of vertebral body height. This demonstrates mild residual T2 hyperintensity and enhancement, although is unchanged from the prior radiographs. No acute fractures are  demonstrated.  A 1.5 cm lesion in the left aspect of the T10 vertebral body demonstrates T2 hyperintensity, intermediate T1 signal and enhancement. Although not entirely specific, this may reflect a hemangioma. There are tiny nonspecific lesions within the T11 and T12 vertebral bodies.  The thoracic cord is normal in signal and caliber. There is no abnormal intradural enhancement.  There is minimal thoracic spondylosis. No cord deformity or foraminal compromise is demonstrated.  Hepatic and left renal cysts are grossly stable.  MRI LUMBAR SPINE FINDINGS  The infiltrating and slightly expansile mass throughout the L2 vertebral body and right pedicle is unchanged. This measures up to 4.9 x 3.9 cm transverse and shows diffuse enhancement following contrast. The associated pathologic fracture is unchanged.  No other suspicious focal marrow lesions or pathologic fractures are identified. There are stable endplate degenerative changes at L3-4 with associated enhancement.  The conus medullaris extends to the L1-2 disc space level. There is no abnormal intradural enhancement.  Lumbar spondylosis appears stable with disc bulging and facet disease throughout the lumbar spine. There is stable mild right foraminal stenosis at L5-S1. The lateral recesses are mildly narrowed bilaterally at L3-4 and L4-5.  IMPRESSION: 1. 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 2. No definite new lesions identified. There are small indeterminate lesions in the lower thoracic spine which may reflect hemangiomas. 3. Stable chronic T8 compression deformity. 4. Stable mild spondylosis.   Electronically Signed   By: Camie Patience M.D.   On: 08/18/2013 15:39   Mr Lumbar Spine W Wo Contrast  08/18/2013   CLINICAL DATA:  Bilateral extremity weakness with foot numbness and mid to low back pain. Multiple myeloma recurrence.  EXAM: MRI CERVICAL, THORACIC AND LUMBAR SPINE WITHOUT AND WITH  CONTRAST  TECHNIQUE: Multiplanar and multiecho pulse sequences of the cervical spine, to include the craniocervical junction and cervicothoracic junction, and thoracic and lumbar spine, were obtained without and with intravenous contrast.  CONTRAST:  58mL MULTIHANCE GADOBENATE DIMEGLUMINE 529 MG/ML IV SOLN  COMPARISON:  DG BONE SURVEY MET dated 08/06/2013; MR L SPINE WO/W CM dated 06/16/2013; CT ABD/PELVIS W CM dated 06/08/2013; DG CHEST 2 VIEW dated 09/21/2008  FINDINGS: MRI CERVICAL SPINE FINDINGS  There is no evidence of lytic lesion, pathologic fracture or abnormal marrow enhancement within the cervical spine. The alignment is near anatomic with a mild scoliosis. There is spondylosis with endplate degeneration and posterior osteophytes.  The craniocervical junction appears normal. The cervical cord is normal in signal and caliber. There is no abnormal intradural enhancement. The vertebral arteries are patent bilaterally, dominant on the left.  Mild central stenosis is present at the C4-5 and C5-6 levels due to spondylosis. There is no cord deformity. Uncinate spurring  contributes to mild foraminal stenosis, greatest on the right at C4-5 and C5-6.  MRI THORACIC SPINE FINDINGS  There is a mild convex right scoliosis. There is a chronic T8 compression deformity with approximately 75% loss of vertebral body height. This demonstrates mild residual T2 hyperintensity and enhancement, although is unchanged from the prior radiographs. No acute fractures are demonstrated.  A 1.5 cm lesion in the left aspect of the T10 vertebral body demonstrates T2 hyperintensity, intermediate T1 signal and enhancement. Although not entirely specific, this may reflect a hemangioma. There are tiny nonspecific lesions within the T11 and T12 vertebral bodies.  The thoracic cord is normal in signal and caliber. There is no abnormal intradural enhancement.  There is minimal thoracic spondylosis. No cord deformity or foraminal compromise is  demonstrated.  Hepatic and left renal cysts are grossly stable.  MRI LUMBAR SPINE FINDINGS  The infiltrating and slightly expansile mass throughout the L2 vertebral body and right pedicle is unchanged. This measures up to 4.9 x 3.9 cm transverse and shows diffuse enhancement following contrast. The associated pathologic fracture is unchanged.  No other suspicious focal marrow lesions or pathologic fractures are identified. There are stable endplate degenerative changes at L3-4 with associated enhancement.  The conus medullaris extends to the L1-2 disc space level. There is no abnormal intradural enhancement.  Lumbar spondylosis appears stable with disc bulging and facet disease throughout the lumbar spine. There is stable mild right foraminal stenosis at L5-S1. The lateral recesses are mildly narrowed bilaterally at L3-4 and L4-5.  IMPRESSION: 1. 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 2. No definite new lesions identified. There are small indeterminate lesions in the lower thoracic spine which may reflect hemangiomas. 3. Stable chronic T8 compression deformity. 4. Stable mild spondylosis.   Electronically Signed   By: Camie Patience M.D.   On: 08/18/2013 15:39   Dg Bone Survey Met  08/06/2013   CLINICAL DATA:  History of multiple myeloma  EXAM: METASTATIC BONE SURVEY  COMPARISON:  06/25/2013  FINDINGS: Evaluation lateral view of the cranium demonstrates an ill-defined area of possible lucency along the posterior vertex region. Collaborative imaging on the prior study is not included. This area demonstrate serpiginous dense intervening components and may represent sequela of arachnoid granulations or possibly vascular channels. Further evaluation with chest CT including a bone algorithm is recommended. No further evidence of blastic nor lytic foci identified within the a appendix nor axial skeleton. Stable compression findings identified  involving T8 and L2. Degenerative changes appreciated within the spine. There is a component of hyperinflation within the long bones and mild blunting of the left costophrenic angle. No focal regions of consolidation normal focal infiltrates are appreciated.  IMPRESSION: 1. Indeterminate finding within the cranium as described above further evaluation and CT recommended, noncontrast. 2. No further evidence of blunting or blastic lesions within a pedicular axial skeleton 3. Stable compression deformities involving T8 and L2 4. Multilevel spondylolysis within the spine.   Electronically Signed   By: Margaree Mackintosh M.D.   On: 08/06/2013 10:42    Impression:  The patient is recovering from the effects of radiation.  He is pain-free at this time.  Plan:  When necessary followup in radiation oncology. The patient will continue close followup in medical oncology.  ____________________________________ Blair Promise, MD

## 2013-08-25 ENCOUNTER — Other Ambulatory Visit: Payer: Self-pay | Admitting: Hematology & Oncology

## 2013-08-25 ENCOUNTER — Ambulatory Visit (HOSPITAL_BASED_OUTPATIENT_CLINIC_OR_DEPARTMENT_OTHER): Payer: Medicare HMO | Admitting: Hematology & Oncology

## 2013-08-25 ENCOUNTER — Telehealth: Payer: Self-pay | Admitting: Hematology & Oncology

## 2013-08-25 ENCOUNTER — Ambulatory Visit (HOSPITAL_BASED_OUTPATIENT_CLINIC_OR_DEPARTMENT_OTHER): Payer: Medicare HMO | Admitting: Lab

## 2013-08-25 ENCOUNTER — Ambulatory Visit (HOSPITAL_BASED_OUTPATIENT_CLINIC_OR_DEPARTMENT_OTHER): Payer: Medicare HMO

## 2013-08-25 DIAGNOSIS — C9000 Multiple myeloma not having achieved remission: Secondary | ICD-10-CM

## 2013-08-25 DIAGNOSIS — C9002 Multiple myeloma in relapse: Secondary | ICD-10-CM

## 2013-08-25 LAB — CMP (CANCER CENTER ONLY)
ALT(SGPT): 25 U/L (ref 10–47)
AST: 17 U/L (ref 11–38)
Albumin: 2.5 g/dL — ABNORMAL LOW (ref 3.3–5.5)
Alkaline Phosphatase: 71 U/L (ref 26–84)
BILIRUBIN TOTAL: 0.9 mg/dL (ref 0.20–1.60)
BUN: 25 mg/dL — AB (ref 7–22)
CO2: 31 meq/L (ref 18–33)
CREATININE: 1.1 mg/dL (ref 0.6–1.2)
Calcium: 8.8 mg/dL (ref 8.0–10.3)
Chloride: 105 mEq/L (ref 98–108)
GLUCOSE: 114 mg/dL (ref 73–118)
Potassium: 4 mEq/L (ref 3.3–4.7)
Sodium: 141 mEq/L (ref 128–145)
Total Protein: 6.1 g/dL — ABNORMAL LOW (ref 6.4–8.1)

## 2013-08-25 LAB — CBC WITH DIFFERENTIAL (CANCER CENTER ONLY)
BASO#: 0 10*3/uL (ref 0.0–0.2)
BASO%: 0.2 % (ref 0.0–2.0)
EOS%: 1.8 % (ref 0.0–7.0)
Eosinophils Absolute: 0.1 10*3/uL (ref 0.0–0.5)
HCT: 28.3 % — ABNORMAL LOW (ref 38.7–49.9)
HEMOGLOBIN: 9.5 g/dL — AB (ref 13.0–17.1)
LYMPH#: 0.4 10*3/uL — AB (ref 0.9–3.3)
LYMPH%: 8.3 % — ABNORMAL LOW (ref 14.0–48.0)
MCH: 33 pg (ref 28.0–33.4)
MCHC: 33.6 g/dL (ref 32.0–35.9)
MCV: 98 fL (ref 82–98)
MONO#: 0.7 10*3/uL (ref 0.1–0.9)
MONO%: 15 % — ABNORMAL HIGH (ref 0.0–13.0)
NEUT%: 74.7 % (ref 40.0–80.0)
NEUTROS ABS: 3.3 10*3/uL (ref 1.5–6.5)
Platelets: 120 10*3/uL — ABNORMAL LOW (ref 145–400)
RBC: 2.88 10*6/uL — ABNORMAL LOW (ref 4.20–5.70)
RDW: 17.4 % — AB (ref 11.1–15.7)
WBC: 4.5 10*3/uL (ref 4.0–10.0)

## 2013-08-25 MED ORDER — SODIUM CHLORIDE 0.9 % IV SOLN
Freq: Once | INTRAVENOUS | Status: AC
  Administered 2013-08-25: 10:00:00 via INTRAVENOUS

## 2013-08-25 MED ORDER — ZOLEDRONIC ACID 4 MG/5ML IV CONC
3.3000 mg | Freq: Once | INTRAVENOUS | Status: AC
  Administered 2013-08-25: 3.3 mg via INTRAVENOUS
  Filled 2013-08-25: qty 4.13

## 2013-08-25 NOTE — Telephone Encounter (Signed)
Dr. Marin Olp aware need referral/ orders for IR Rad Eval at [facility] [phone] for the vertbroplasty

## 2013-08-25 NOTE — Addendum Note (Signed)
Addended by: Burney Gauze R on: 08/25/2013 10:13 AM   Modules accepted: Orders

## 2013-08-25 NOTE — Patient Instructions (Signed)

## 2013-08-25 NOTE — Telephone Encounter (Signed)
Pt aware of 745 am appointment at [facility] [phone]

## 2013-08-25 NOTE — Progress Notes (Signed)
This office note has been dictated.

## 2013-08-26 NOTE — Progress Notes (Signed)
CC:   Ermalene Searing. Philip Aspen, M.D.  DIAGNOSIS:  Recurrent IgG lambda myeloma.  CURRENT THERAPY: 1. Pomalidomide 3 mg p.o. daily (21 days on/7 days off). 2. Patient is status post radiation therapy to the lumbar spine. 3. Zometa 3.3 mg IV every 4 weeks.  INTERIM HISTORY:  Mr. Brockbank comes in for his followup.  He is looking better.  He is feeling better.  He is not having a lot of pain in the lower back.  We did go ahead and repeat his MRIs.  He still has a very significant tumor involving L2.  This is stable.  It is hard to say how "active" this is.  I think Mr. Ahart would benefit from a vertebroplasty.  We can get a biopsy of this tumor and also get this man in to help prevent further pathological fracturing of L2.  When I last saw him, his myeloma studies were not done.  We are doing them today.  He is busy trying to take care of his wife.  This has been a quite chore for him.  He is trying to do his best.  PHYSICAL EXAMINATION:  General:  This is an elderly, but well-nourished white gentleman who is slightly cushingoid.  Vital Signs:  Temperature of 97.4, pulse 67, respiratory rate 14, blood pressure 132/70.  Weight is 153 pounds.  Head and Neck:  Normocephalic, atraumatic skull.  There are no ocular or oral lesions.  There are no palpable cervical, supraclavicular, lymph nodes.  Lungs:  Clear bilaterally.  Cardiac: Regular rate and rhythm with a normal S1 and S2.  There are no murmurs, rubs, or bruits.  Abdomen:  Soft.  He has good bowel sounds.  There is no fluid wave.  There is no palpable abdominal mass.  There is no palpable hepatosplenomegaly.  Back exam:  No tenderness over the spine, ribs, or hips.  Extremities:  No clubbing, cyanosis, or edema. Neurological:  No focal or neurological deficits.  LABORATORY STUDIES:  White cell count is 4.5, hemoglobin 9.5, hematocrit of 28.3, platelet count 120,000.  Calcium is 8.8 with an albumin of 2.5, BUN 25, creatinine  1.11.  IMPRESSION:  Mr. Elza is an 80 year old gentleman with history of IgG lambda myeloma.  He is doing incredibly well.  Then all of a sudden, he had recurrence with involvement of his lumbar spine.  Had pathologic fracture at L2.  Again, we will go ahead and think about doing a vertebroplasty of this area.  Again, we can get biopsies when we do the vertebroplasty.  We will continue him on the pomalidomide.  He will get his Zometa today.  I will plan to see him back in another 4 weeks.    ______________________________ [name], M.D. PRE/MEDQ  D:  08/25/2013  T:  08/26/2013  Job:  [id number]

## 2013-08-27 ENCOUNTER — Ambulatory Visit
Admission: RE | Admit: 2013-08-27 | Discharge: 2013-08-27 | Disposition: A | Discharge status: Home / Self Care | Payer: Medicare HMO | Source: Ambulatory Visit | Attending: Hematology & Oncology | Admitting: Hematology & Oncology

## 2013-08-27 DIAGNOSIS — C9000 Multiple myeloma not having achieved remission: Secondary | ICD-10-CM

## 2013-08-27 LAB — IGG, IGA, IGM
IGA: 241 mg/dL (ref 68–379)
IGM, SERUM: 242 mg/dL (ref 41–251)
IgG (Immunoglobin G), Serum: 901 mg/dL (ref 650–1600)

## 2013-08-27 LAB — PROTEIN ELECTROPHORESIS, SERUM, WITH REFLEX
ALPHA-2-GLOBULIN: 12.6 % — AB (ref 7.1–11.8)
Albumin ELP: 52.4 % — ABNORMAL LOW (ref 55.8–66.1)
Alpha-1-Globulin: 6.7 % — ABNORMAL HIGH (ref 2.9–4.9)
BETA 2: 5.3 % (ref 3.2–6.5)
Beta Globulin: 6.3 % (ref 4.7–7.2)
GAMMA GLOBULIN: 16.7 % (ref 11.1–18.8)
M-Spike, %: 0.44 g/dL
Total Protein, Serum Electrophoresis: 5.8 g/dL — ABNORMAL LOW (ref 6.0–8.3)

## 2013-08-27 LAB — KAPPA/LAMBDA LIGHT CHAINS
Kappa free light chain: 5.21 mg/dL — ABNORMAL HIGH (ref 0.33–1.94)
Kappa:Lambda Ratio: 0.51 (ref 0.26–1.65)
Lambda Free Lght Chn: 10.2 mg/dL — ABNORMAL HIGH (ref 0.57–2.63)

## 2013-08-27 LAB — LACTATE DEHYDROGENASE: LDH: 273 U/L — ABNORMAL HIGH (ref 94–250)

## 2013-08-27 LAB — IFE INTERPRETATION

## 2013-08-31 ENCOUNTER — Ambulatory Visit (HOSPITAL_COMMUNITY): Admission: RE | Admit: 2013-08-31 | Payer: Medicare Other | Source: Ambulatory Visit

## 2013-08-31 ENCOUNTER — Other Ambulatory Visit: Payer: Self-pay | Admitting: Hematology & Oncology

## 2013-08-31 DIAGNOSIS — Z8579 Personal history of other malignant neoplasms of lymphoid, hematopoietic and related tissues: Secondary | ICD-10-CM

## 2013-09-01 ENCOUNTER — Other Ambulatory Visit: Payer: Self-pay | Admitting: *Deleted

## 2013-09-01 MED ORDER — POMALIDOMIDE 3 MG PO CAPS: 3.0000 mg | ORAL_CAPSULE | Freq: Every day | ORAL | Status: DC

## 2013-09-03 ENCOUNTER — Other Ambulatory Visit (HOSPITAL_COMMUNITY): Payer: Self-pay | Admitting: Radiology

## 2013-09-03 ENCOUNTER — Other Ambulatory Visit: Payer: Self-pay | Admitting: Hematology & Oncology

## 2013-09-03 ENCOUNTER — Ambulatory Visit
Admission: RE | Admit: 2013-09-03 | Discharge: 2013-09-03 | Disposition: A | Discharge status: Home / Self Care | Payer: Medicare HMO | Source: Ambulatory Visit | Attending: Hematology & Oncology | Admitting: Hematology & Oncology

## 2013-09-03 VITALS — BP 140/73 | HR 57 | Temp 97.9°F | Resp 12

## 2013-09-03 DIAGNOSIS — C9000 Multiple myeloma not having achieved remission: Secondary | ICD-10-CM

## 2013-09-03 DIAGNOSIS — Z8579 Personal history of other malignant neoplasms of lymphoid, hematopoietic and related tissues: Secondary | ICD-10-CM

## 2013-09-03 MED ORDER — FENTANYL CITRATE 0.05 MG/ML IJ SOLN
25.0000 ug | INTRAMUSCULAR | Status: DC | PRN
  Administered 2013-09-03 (×2): 25 ug via INTRAVENOUS

## 2013-09-03 MED ORDER — SODIUM CHLORIDE 0.9 % IV SOLN
Freq: Once | INTRAVENOUS | Status: AC
  Administered 2013-09-03: 08:00:00 via INTRAVENOUS

## 2013-09-03 MED ORDER — CEFAZOLIN SODIUM-DEXTROSE 2-3 GM-% IV SOLR
2.0000 g | Freq: Once | INTRAVENOUS | Status: AC
  Administered 2013-09-03: 2 g via INTRAVENOUS

## 2013-09-03 MED ORDER — KETOROLAC TROMETHAMINE 30 MG/ML IJ SOLN
30.0000 mg | Freq: Once | INTRAMUSCULAR | Status: AC
  Administered 2013-09-03: 30 mg via INTRAVENOUS

## 2013-09-03 MED ORDER — MIDAZOLAM HCL 2 MG/2ML IJ SOLN
1.0000 mg | INTRAMUSCULAR | Status: DC | PRN
  Administered 2013-09-03 (×2): 1 mg via INTRAVENOUS

## 2013-09-03 NOTE — Discharge Instructions (Addendum)
Disc Aspiration Post Procedure Discharge Instructions  1. May resume a regular diet and any medications that you routinely take (including pain medications). 2. No driving day of procedure. 3. Upon discharge go home and rest for at least 4 hours.  May use an ice pack as needed to injection sites on back. 4. Change dressing after shower tomorrow. May replace with bandaides if needed daily till healed. 5. No heavy lifting or bending until follow up with Dr. Marin Olp.     Please contact our office at [phone] for the following symptoms:   Fever greater than 100 degrees  Increased swelling, pain, or redness at injection site.   Thank you for visiting [facility].

## 2013-09-06 ENCOUNTER — Other Ambulatory Visit: Payer: Self-pay | Admitting: *Deleted

## 2013-09-06 MED ORDER — CIPROFLOXACIN HCL 500 MG PO TABS: 500.0000 mg | ORAL_TABLET | Freq: Two times a day (BID) | ORAL | Status: DC

## 2013-09-07 ENCOUNTER — Encounter: Payer: Self-pay | Admitting: Nurse Practitioner

## 2013-09-07 NOTE — Progress Notes (Signed)
Pt called and stated that he received a call informing him that his rx copay would be $631 which he could not afford. I completed PANF application and based on immediate feedback he has qualified for copay assistance. Pt is aware and awaiting final approval letter via fax.

## 2013-09-07 NOTE — Progress Notes (Signed)
Received faxed approval from [place]. Pt is eligible for 10,000. I called and notified the patient as well as [facility]. They stated it would take 48-72hr to verify RX bin # and Group ID and they could then contact pt for shipping and updated copay information. Pt verbalized understanding and appreciation.

## 2013-09-09 ENCOUNTER — Encounter: Payer: Self-pay | Admitting: Hematology & Oncology

## 2013-09-17 ENCOUNTER — Other Ambulatory Visit (HOSPITAL_COMMUNITY): Payer: Self-pay | Admitting: Hematology & Oncology

## 2013-09-17 ENCOUNTER — Ambulatory Visit (HOSPITAL_BASED_OUTPATIENT_CLINIC_OR_DEPARTMENT_OTHER): Payer: Medicare HMO | Admitting: Hematology & Oncology

## 2013-09-17 ENCOUNTER — Ambulatory Visit (HOSPITAL_BASED_OUTPATIENT_CLINIC_OR_DEPARTMENT_OTHER): Payer: Medicare HMO

## 2013-09-17 ENCOUNTER — Ambulatory Visit (HOSPITAL_BASED_OUTPATIENT_CLINIC_OR_DEPARTMENT_OTHER): Payer: Medicare HMO | Admitting: Lab

## 2013-09-17 ENCOUNTER — Encounter: Payer: Self-pay | Admitting: Hematology & Oncology

## 2013-09-17 VITALS — BP 152/75 | HR 72 | Temp 97.7°F | Resp 18 | Ht 69.0 in | Wt 155.0 lb

## 2013-09-17 DIAGNOSIS — M792 Neuralgia and neuritis, unspecified: Secondary | ICD-10-CM

## 2013-09-17 DIAGNOSIS — M7989 Other specified soft tissue disorders: Secondary | ICD-10-CM

## 2013-09-17 DIAGNOSIS — C9000 Multiple myeloma not having achieved remission: Secondary | ICD-10-CM

## 2013-09-17 DIAGNOSIS — C9002 Multiple myeloma in relapse: Secondary | ICD-10-CM

## 2013-09-17 DIAGNOSIS — R300 Dysuria: Secondary | ICD-10-CM

## 2013-09-17 DIAGNOSIS — G5790 Unspecified mononeuropathy of unspecified lower limb: Secondary | ICD-10-CM

## 2013-09-17 LAB — CMP (CANCER CENTER ONLY)
ALT(SGPT): 21 U/L (ref 10–47)
AST: 25 U/L (ref 11–38)
Albumin: 2.9 g/dL — ABNORMAL LOW (ref 3.3–5.5)
Alkaline Phosphatase: 59 U/L (ref 26–84)
BILIRUBIN TOTAL: 0.9 mg/dL (ref 0.20–1.60)
BUN: 18 mg/dL (ref 7–22)
CO2: 31 meq/L (ref 18–33)
Calcium: 9 mg/dL (ref 8.0–10.3)
Chloride: 106 mEq/L (ref 98–108)
Creat: 1.1 mg/dl (ref 0.6–1.2)
GLUCOSE: 120 mg/dL — AB (ref 73–118)
Potassium: 3.8 mEq/L (ref 3.3–4.7)
Sodium: 141 mEq/L (ref 128–145)
Total Protein: 6.5 g/dL (ref 6.4–8.1)

## 2013-09-17 LAB — URINALYSIS, MICROSCOPIC (CHCC SATELLITE)
BILIRUBIN (URINE): NEGATIVE
BLOOD: NEGATIVE
GLUCOSE UR: NEGATIVE mg/dL
Ketones: NEGATIVE mg/dL
Leukocyte Esterase: NEGATIVE
NITRITE: NEGATIVE
Protein: 30 mg/dL
Specific Gravity, Urine: 1.02 (ref 1.003–1.035)
Urobilinogen, UR: 0.2 mg/dL (ref 0.2–1)
pH: 6 (ref 4.60–8.00)

## 2013-09-17 LAB — CBC WITH DIFFERENTIAL (CANCER CENTER ONLY)
BASO#: 0.1 10*3/uL (ref 0.0–0.2)
BASO%: 3 % — AB (ref 0.0–2.0)
EOS%: 6.6 % (ref 0.0–7.0)
Eosinophils Absolute: 0.2 10*3/uL (ref 0.0–0.5)
HEMATOCRIT: 29.2 % — AB (ref 38.7–49.9)
HGB: 9.6 g/dL — ABNORMAL LOW (ref 13.0–17.1)
LYMPH#: 0.3 10*3/uL — ABNORMAL LOW (ref 0.9–3.3)
LYMPH%: 10.2 % — ABNORMAL LOW (ref 14.0–48.0)
MCH: 32.8 pg (ref 28.0–33.4)
MCHC: 32.9 g/dL (ref 32.0–35.9)
MCV: 100 fL — ABNORMAL HIGH (ref 82–98)
MONO#: 0.3 10*3/uL (ref 0.1–0.9)
MONO%: 10.2 % (ref 0.0–13.0)
NEUT#: 2.3 10*3/uL (ref 1.5–6.5)
NEUT%: 70 % (ref 40.0–80.0)
PLATELETS: 252 10*3/uL (ref 145–400)
RBC: 2.93 10*6/uL — ABNORMAL LOW (ref 4.20–5.70)
RDW: 15.6 % (ref 11.1–15.7)
WBC: 3.3 10*3/uL — ABNORMAL LOW (ref 4.0–10.0)

## 2013-09-17 LAB — TECHNOLOGIST REVIEW CHCC SATELLITE

## 2013-09-17 MED ORDER — ZOLEDRONIC ACID 4 MG/5ML IV CONC
3.3000 mg | Freq: Once | INTRAVENOUS | Status: AC
  Administered 2013-09-17: 3.3 mg via INTRAVENOUS
  Filled 2013-09-17: qty 4.13

## 2013-09-17 MED ORDER — VITAMIN B-6 250 MG PO TABS: 250.0000 mg | ORAL_TABLET | Freq: Every day | ORAL | Status: DC

## 2013-09-17 NOTE — Patient Instructions (Signed)

## 2013-09-17 NOTE — Progress Notes (Signed)
This office note has been dictated.

## 2013-09-18 LAB — URINE CULTURE

## 2013-09-18 NOTE — Progress Notes (Signed)
DIAGNOSIS:  Recurrent IgG lambda myeloma.  CURRENT THERAPY: 1. Pomalidomide 2 mg p.o. daily (21 days on/7 days off). 2. Zometa 3.3 mg IV q.4 weeks.  INTERIM HISTORY:  Christian Burns comes in for his followup.  He completed all his radiation therapy.  He did have a kyphoplasty done.  With the kyphoplasty, he did have a biopsy.  This was done on the 23rd of January.  The pathology report ([id number]) showed no obvious plasma cell malignancy.  As such, I would think that the radiation that he took certainly was quite effective.  He is on pomalidomide.  He is doing fairly well on the pomalidomide.  He has had a little bit of swelling in the left lower leg.  He is on aspirin.  I think we probably have to get a Doppler of his left leg to make sure that there is no DVT.  When we last saw him in January, his monoclonal spike was down to 0.44 g/dL.  IgG level was 901 mg/dL.  Lambda light chain was 10.2 mg/dL.  He has had no cough.  He has had no fever.  There has been no change in bowel or bladder habits.  Of note, we are getting a 24-hour urine on him to see if he is excreting light chains in his urine.  PHYSICAL EXAMINATION:  General:  This is an elderly, thin, white gentleman who is a fairly well nourished.  Vital Signs:  Temperature of 97.7, pulse 72, respiratory rate 18, blood pressure 152/75, weight is 155 pounds.  Head and Neck:  No ocular or oral lesions.  He has no scleral icterus.  There is no adenopathy in the neck.  Lungs:  Clear bilaterally.  Cardiac:  Regular rate and rhythm with a normal S1, S2. There are no murmurs, rubs, or bruits.  Abdomen:  Soft.  He has good bowel sounds.  There is no fluid wave.  There is no palpable abdominal mass.  There is no palpable hepatosplenomegaly.  Extremities:  Show some slight nonpitting edema of the left lower leg.  No venous cord is noted. He has good pulses.  He has good strength in his arms and legs.  Skin: No rashes, ecchymoses, or  petechia.  LABORATORY STUDIES:  White cell count is 3.3, hemoglobin 9.6, hematocrit 29.2, platelet count 252.  Sodium 141, potassium 3.8, BUN 18, creatinine 1.1.  IMPRESSION:  Christian Burns is a nice 80 year old gentleman.  He has recurrent IgG lambda myeloma.  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  He had radiation therapy for this.  Again, his serum myeloma studies have not been all that bad.  I think that we might be more productive in seeing what his urine shows.  He has a 24-hour urine that is going to bring in for [place].  We will get a Doppler of his leg.  I want to make sure that he does not have a DVT.  Of note, he recently had a urinary tract infection.  He does have prostate enlargement.  He does see a urologist.  I recommend that he go back to see his urologist.  I want to see him back in 1 more month.  He will get his Zometa today.  I spent a good 40 minutes with him today.  I answered all of his questions.  I reviewed all his lab work with him.    ______________________________ [name], M.D. PRE/MEDQ  D:  09/17/2013  T:  09/18/2013  Job:  [id number] 

## 2013-09-21 ENCOUNTER — Ambulatory Visit (HOSPITAL_COMMUNITY)
Admission: RE | Admit: 2013-09-21 | Discharge: 2013-09-21 | Disposition: A | Discharge status: Home / Self Care | Payer: Medicare HMO | Source: Ambulatory Visit | Attending: Hematology & Oncology | Admitting: Hematology & Oncology

## 2013-09-21 DIAGNOSIS — M7989 Other specified soft tissue disorders: Secondary | ICD-10-CM

## 2013-09-21 DIAGNOSIS — C9000 Multiple myeloma not having achieved remission: Secondary | ICD-10-CM

## 2013-09-21 DIAGNOSIS — R609 Edema, unspecified: Secondary | ICD-10-CM | POA: Insufficient documentation

## 2013-09-21 LAB — PROTEIN ELECTROPHORESIS, SERUM, WITH REFLEX
ALBUMIN ELP: 51.8 % — AB (ref 55.8–66.1)
ALPHA-2-GLOBULIN: 14.4 % — AB (ref 7.1–11.8)
Alpha-1-Globulin: 6.6 % — ABNORMAL HIGH (ref 2.9–4.9)
Beta 2: 6.2 % (ref 3.2–6.5)
Beta Globulin: 6.1 % (ref 4.7–7.2)
Gamma Globulin: 14.9 % (ref 11.1–18.8)
M-Spike, %: 0.4 g/dL
TOTAL PROTEIN, SERUM ELECTROPHOR: 5.9 g/dL — AB (ref 6.0–8.3)

## 2013-09-21 LAB — KAPPA/LAMBDA LIGHT CHAINS
KAPPA FREE LGHT CHN: 3.11 mg/dL — AB (ref 0.33–1.94)
Kappa:Lambda Ratio: 0.41 (ref 0.26–1.65)
LAMBDA FREE LGHT CHN: 7.57 mg/dL — AB (ref 0.57–2.63)

## 2013-09-21 LAB — IGG, IGA, IGM
IGA: 250 mg/dL (ref 68–379)
IGG (IMMUNOGLOBIN G), SERUM: 819 mg/dL (ref 650–1600)
IGM, SERUM: 168 mg/dL (ref 41–251)

## 2013-09-21 LAB — LACTATE DEHYDROGENASE: LDH: 235 U/L (ref 94–250)

## 2013-09-21 LAB — IFE INTERPRETATION

## 2013-09-21 NOTE — Progress Notes (Signed)
VASCULAR LAB PRELIMINARY  PRELIMINARY  PRELIMINARY  PRELIMINARY  Left lower extremity venous duplex completed.    Preliminary report:  Left:  No evidence of DVT, superficial thrombosis, or Baker's cyst.  Christian Burns, RVS 09/21/2013, 10:09 AM

## 2013-09-27 ENCOUNTER — Ambulatory Visit: Payer: Medicare Other

## 2013-09-27 DIAGNOSIS — M792 Neuralgia and neuritis, unspecified: Secondary | ICD-10-CM

## 2013-09-27 DIAGNOSIS — C9000 Multiple myeloma not having achieved remission: Secondary | ICD-10-CM

## 2013-09-27 DIAGNOSIS — R300 Dysuria: Secondary | ICD-10-CM

## 2013-09-30 LAB — UIFE/LIGHT CHAINS/TP QN, 24-HR UR
ALPHA 2 UR: DETECTED — AB
Albumin, U: DETECTED
Alpha 1, Urine: DETECTED — AB
BETA UR: DETECTED — AB
FREE KAPPA/LAMBDA RATIO: 5.31 ratio (ref 2.04–10.37)
FREE LT CHN EXCR RATE: 245.3 mg/d
Free Kappa Lt Chains,Ur: 22.3 mg/dL — ABNORMAL HIGH (ref 0.14–2.42)
Free Lambda Excretion/Day: 46.2 mg/d
Free Lambda Lt Chains,Ur: 4.2 mg/dL — ABNORMAL HIGH (ref 0.02–0.67)
Gamma Globulin, Urine: DETECTED — AB
TOTAL PROTEIN, URINE-UR/DAY: 473 mg/d — AB (ref 10–140)
Time: 24 hours
Total Protein, Urine: 43 mg/dL
Volume, Urine: 1100 mL

## 2013-10-01 ENCOUNTER — Other Ambulatory Visit: Payer: Self-pay | Admitting: *Deleted

## 2013-10-01 MED ORDER — POMALIDOMIDE 3 MG PO CAPS: 3.0000 mg | ORAL_CAPSULE | Freq: Every day | ORAL | Status: DC

## 2013-10-15 ENCOUNTER — Ambulatory Visit (HOSPITAL_BASED_OUTPATIENT_CLINIC_OR_DEPARTMENT_OTHER): Payer: Medicare HMO | Admitting: Lab

## 2013-10-15 ENCOUNTER — Ambulatory Visit (HOSPITAL_BASED_OUTPATIENT_CLINIC_OR_DEPARTMENT_OTHER): Payer: Medicare Other | Admitting: Hematology & Oncology

## 2013-10-15 ENCOUNTER — Encounter: Payer: Self-pay | Admitting: Hematology & Oncology

## 2013-10-15 ENCOUNTER — Ambulatory Visit (HOSPITAL_BASED_OUTPATIENT_CLINIC_OR_DEPARTMENT_OTHER): Payer: Medicare Other

## 2013-10-15 VITALS — BP 158/72 | HR 63 | Temp 97.4°F | Resp 18 | Ht 71.0 in | Wt 157.0 lb

## 2013-10-15 DIAGNOSIS — M792 Neuralgia and neuritis, unspecified: Secondary | ICD-10-CM

## 2013-10-15 DIAGNOSIS — R300 Dysuria: Secondary | ICD-10-CM

## 2013-10-15 DIAGNOSIS — C9000 Multiple myeloma not having achieved remission: Secondary | ICD-10-CM

## 2013-10-15 DIAGNOSIS — C9002 Multiple myeloma in relapse: Secondary | ICD-10-CM

## 2013-10-15 LAB — CBC WITH DIFFERENTIAL (CANCER CENTER ONLY)
BASO#: 0.1 10*3/uL (ref 0.0–0.2)
BASO%: 3.8 % — ABNORMAL HIGH (ref 0.0–2.0)
EOS%: 6.7 % (ref 0.0–7.0)
Eosinophils Absolute: 0.2 10*3/uL (ref 0.0–0.5)
HEMATOCRIT: 31.5 % — AB (ref 38.7–49.9)
HGB: 10.5 g/dL — ABNORMAL LOW (ref 13.0–17.1)
LYMPH#: 0.4 10*3/uL — AB (ref 0.9–3.3)
LYMPH%: 11.5 % — ABNORMAL LOW (ref 14.0–48.0)
MCH: 31.9 pg (ref 28.0–33.4)
MCHC: 33.3 g/dL (ref 32.0–35.9)
MCV: 96 fL (ref 82–98)
MONO#: 0.5 10*3/uL (ref 0.1–0.9)
MONO%: 15.7 % — ABNORMAL HIGH (ref 0.0–13.0)
NEUT#: 1.9 10*3/uL (ref 1.5–6.5)
NEUT%: 62.3 % (ref 40.0–80.0)
Platelets: 181 10*3/uL (ref 145–400)
RBC: 3.29 10*6/uL — AB (ref 4.20–5.70)
RDW: 14.3 % (ref 11.1–15.7)
WBC: 3.1 10*3/uL — ABNORMAL LOW (ref 4.0–10.0)

## 2013-10-15 LAB — CMP (CANCER CENTER ONLY)
ALK PHOS: 72 U/L (ref 26–84)
ALT: 12 U/L (ref 10–47)
AST: 21 U/L (ref 11–38)
Albumin: 3 g/dL — ABNORMAL LOW (ref 3.3–5.5)
BUN, Bld: 14 mg/dL (ref 7–22)
CO2: 27 meq/L (ref 18–33)
CREATININE: 1.3 mg/dL — AB (ref 0.6–1.2)
Calcium: 8.8 mg/dL (ref 8.0–10.3)
Chloride: 107 mEq/L (ref 98–108)
Glucose, Bld: 103 mg/dL (ref 73–118)
Potassium: 3.8 mEq/L (ref 3.3–4.7)
Sodium: 139 mEq/L (ref 128–145)
Total Bilirubin: 1 mg/dl (ref 0.20–1.60)
Total Protein: 6.5 g/dL (ref 6.4–8.1)

## 2013-10-15 MED ORDER — SODIUM CHLORIDE 0.9 % IV SOLN
3.3000 mg | Freq: Once | INTRAVENOUS | Status: AC
  Administered 2013-10-15: 3.3 mg via INTRAVENOUS
  Filled 2013-10-15: qty 4.13

## 2013-10-15 MED ORDER — SODIUM CHLORIDE 0.9 % IJ SOLN
10.0000 mL | INTRAMUSCULAR | Status: DC | PRN
  Filled 2013-10-15: qty 10

## 2013-10-15 MED ORDER — SODIUM CHLORIDE 0.9 % IV SOLN
Freq: Once | INTRAVENOUS | Status: AC
  Administered 2013-10-15: 11:00:00 via INTRAVENOUS

## 2013-10-15 NOTE — Progress Notes (Addendum)
DIAGNOSIS:  Recurrent IgG lambda myeloma.  CURRENT THERAPY: 1. Pomalidomide 2 mg p.o. daily (21 days on/7 days off). 2. Zometa 3.3 mg IV q.4 weeks.  INTERIM HISTORY:  Christian Burns comes in for his followup.  He completed all his radiation therapy.  He did have a kyphoplasty done.  With the kyphoplasty, he did have a biopsy.  This was done on the 23rd of January.  The pathology report ([id number]) showed no obvious plasma cell malignancy.  As such, I would think that the radiation that he took certainly was quite effective.  He is on pomalidomide.  He is doing fairly well on the pomalidomide.  The last myeloma studies are showing an increase in levels - mostly lambda light chains. 24hr urine shows 46.2 mg per day.  Serum lambda light chain is 7.57 mg/dL.   He has had a little bit of swelling in the left lower leg. We did on his last visit. This was negative for any DVT. He is on aspirin.  When we last saw him in February. his monoclonal spike was down to 0.40 g/dL.  IgG level was 819 mg/dL.  Lambda light chain was 7.57 mg/dL.  He has had no cough.  He has had no fever.  There has been no change in bowel or bladder habits.   PHYSICAL EXAMINATION:  General:  This is an elderly, thin, white gentleman who is a fairly well nourished.  Vital Signs:  Temperature of 97.4, pulse 63, respiratory rate 18, blood pressure 158/72, weight is 157 pounds.  Head and Neck:  No ocular or oral lesions.  He has no scleral icterus.  There is no adenopathy in the neck.  Lungs:  Clear bilaterally.  Cardiac:  Regular rate and rhythm with a normal S1, S2. There are no murmurs, rubs, or bruits.  Abdomen:  Soft.  He has good bowel sounds.  There is no fluid wave.  There is no palpable abdominal mass.  There is no palpable hepatosplenomegaly.  Extremities:  Show some slight nonpitting edema of the left lower leg.  No venous cord is noted. He has good pulses.  He has good strength in his arms and legs.  Skin: No  rashes, ecchymoses, or petechia.  LABORATORY STUDIES:  White cell count is 3.1, hemoglobin 10.5, hematocrit 31.2, platelet count 252.  Sodium 141, potassium 3.8, BUN 18, creatinine 1.1.  IMPRESSION:  Christian Burns is a nice 80 year old gentleman.  He has recurrent IgG lambda myeloma.  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  He had radiation therapy for this.  Again, his serum myeloma studies have not been all that bad.  However the urine looks worse.  I believe that we will need to add Velcade to the Pomalyst.  He never has had a PI agent. I believe can tolerate this.  I explained this too him.  He understands. He wants to make sure that he will not get sick and have this affect him caring for his wife.   Of note, he recently had a urinary tract infection.  He does have prostate enlargement.  He does see a urologist.  I recommend that he go back to see his urologist.  I want to see him back in 1 more month. We will start the Velcade with the next cycle of Pomalyst. He will get his Zometa today.  I spent a good 45 minutes with him today.  I answered all of his questions.  I reviewed all his lab work with  him.

## 2013-10-15 NOTE — Patient Instructions (Signed)

## 2013-10-20 LAB — PROTEIN ELECTROPHORESIS, SERUM, WITH REFLEX
ALPHA-2-GLOBULIN: 12.1 % — AB (ref 7.1–11.8)
Albumin ELP: 55.7 % — ABNORMAL LOW (ref 55.8–66.1)
Alpha-1-Globulin: 5.5 % — ABNORMAL HIGH (ref 2.9–4.9)
Beta 2: 4.8 % (ref 3.2–6.5)
Beta Globulin: 6.5 % (ref 4.7–7.2)
GAMMA GLOBULIN: 15.4 % (ref 11.1–18.8)
M-SPIKE, %: 0.37 g/dL
TOTAL PROTEIN, SERUM ELECTROPHOR: 6.2 g/dL (ref 6.0–8.3)

## 2013-10-20 LAB — IGG, IGA, IGM
IGM, SERUM: 112 mg/dL (ref 41–251)
IgA: 245 mg/dL (ref 68–379)
IgG (Immunoglobin G), Serum: 999 mg/dL (ref 650–1600)

## 2013-10-20 LAB — KAPPA/LAMBDA LIGHT CHAINS
KAPPA FREE LGHT CHN: 2.91 mg/dL — AB (ref 0.33–1.94)
Kappa:Lambda Ratio: 0.45 (ref 0.26–1.65)
LAMBDA FREE LGHT CHN: 6.47 mg/dL — AB (ref 0.57–2.63)

## 2013-10-20 LAB — LACTATE DEHYDROGENASE: LDH: 205 U/L (ref 94–250)

## 2013-10-20 LAB — IFE INTERPRETATION

## 2013-10-26 ENCOUNTER — Other Ambulatory Visit: Payer: Self-pay | Admitting: *Deleted

## 2013-10-26 MED ORDER — POMALIDOMIDE 3 MG PO CAPS: 3.0000 mg | ORAL_CAPSULE | Freq: Every day | ORAL | Status: DC

## 2013-10-28 ENCOUNTER — Encounter: Payer: Self-pay | Admitting: Hematology & Oncology

## 2013-11-04 ENCOUNTER — Ambulatory Visit (HOSPITAL_BASED_OUTPATIENT_CLINIC_OR_DEPARTMENT_OTHER): Payer: Medicare HMO | Admitting: Lab

## 2013-11-04 ENCOUNTER — Encounter: Payer: Self-pay | Admitting: Hematology & Oncology

## 2013-11-04 ENCOUNTER — Ambulatory Visit (HOSPITAL_BASED_OUTPATIENT_CLINIC_OR_DEPARTMENT_OTHER): Payer: Medicare HMO | Admitting: Hematology & Oncology

## 2013-11-04 ENCOUNTER — Ambulatory Visit (HOSPITAL_BASED_OUTPATIENT_CLINIC_OR_DEPARTMENT_OTHER): Payer: Medicare HMO

## 2013-11-04 VITALS — BP 137/77 | HR 63 | Temp 97.8°F | Resp 18 | Ht 68.0 in | Wt 158.0 lb

## 2013-11-04 DIAGNOSIS — C9000 Multiple myeloma not having achieved remission: Secondary | ICD-10-CM

## 2013-11-04 DIAGNOSIS — R600 Localized edema: Secondary | ICD-10-CM

## 2013-11-04 DIAGNOSIS — C9002 Multiple myeloma in relapse: Secondary | ICD-10-CM

## 2013-11-04 LAB — CBC WITH DIFFERENTIAL (CANCER CENTER ONLY)
BASO#: 0.1 10*3/uL (ref 0.0–0.2)
BASO%: 2.7 % — ABNORMAL HIGH (ref 0.0–2.0)
EOS%: 5.1 % (ref 0.0–7.0)
Eosinophils Absolute: 0.2 10*3/uL (ref 0.0–0.5)
HEMATOCRIT: 30.8 % — AB (ref 38.7–49.9)
HEMOGLOBIN: 10.5 g/dL — AB (ref 13.0–17.1)
LYMPH#: 0.5 10*3/uL — ABNORMAL LOW (ref 0.9–3.3)
LYMPH%: 16.2 % (ref 14.0–48.0)
MCH: 31.7 pg (ref 28.0–33.4)
MCHC: 34.1 g/dL (ref 32.0–35.9)
MCV: 93 fL (ref 82–98)
MONO#: 0.7 10*3/uL (ref 0.1–0.9)
MONO%: 20.4 % — AB (ref 0.0–13.0)
NEUT#: 1.9 10*3/uL (ref 1.5–6.5)
NEUT%: 55.6 % (ref 40.0–80.0)
Platelets: 176 10*3/uL (ref 145–400)
RBC: 3.31 10*6/uL — ABNORMAL LOW (ref 4.20–5.70)
RDW: 13.9 % (ref 11.1–15.7)
WBC: 3.3 10*3/uL — ABNORMAL LOW (ref 4.0–10.0)

## 2013-11-04 MED ORDER — ALTEPLASE 2 MG IJ SOLR
2.0000 mg | Freq: Once | INTRAMUSCULAR | Status: DC | PRN
  Filled 2013-11-04: qty 2

## 2013-11-04 MED ORDER — HEPARIN SOD (PORK) LOCK FLUSH 100 UNIT/ML IV SOLN
500.0000 [IU] | Freq: Once | INTRAVENOUS | Status: DC | PRN
  Filled 2013-11-04: qty 5

## 2013-11-04 MED ORDER — ZOLEDRONIC ACID 4 MG/5ML IV CONC
3.3000 mg | Freq: Once | INTRAVENOUS | Status: AC
  Administered 2013-11-04: 3.3 mg via INTRAVENOUS
  Filled 2013-11-04: qty 4.13

## 2013-11-04 MED ORDER — SODIUM CHLORIDE 0.9 % IJ SOLN
3.0000 mL | Freq: Once | INTRAMUSCULAR | Status: DC | PRN
  Filled 2013-11-04: qty 10

## 2013-11-04 MED ORDER — METOLAZONE 2.5 MG PO TABS: ORAL_TABLET | ORAL | Status: DC

## 2013-11-04 MED ORDER — HEPARIN SOD (PORK) LOCK FLUSH 100 UNIT/ML IV SOLN
250.0000 [IU] | Freq: Once | INTRAVENOUS | Status: DC | PRN
  Filled 2013-11-04: qty 5

## 2013-11-04 MED ORDER — SODIUM CHLORIDE 0.9 % IV SOLN
Freq: Once | INTRAVENOUS | Status: AC
  Administered 2013-11-04: 14:00:00 via INTRAVENOUS

## 2013-11-04 NOTE — Patient Instructions (Signed)

## 2013-11-04 NOTE — Progress Notes (Signed)
HematologyI Gig and Oncology Follow Up Visit  Christian Burns [id number] [birth year] 80 y.o. 11/04/2013   Principle Diagnosis:  IgG lambda myeloma-recurrent Current Therapy:    Pomalidomide 2 mg by mouth daily (21/7)  Zometa 3.3 mg IV q. 4 weeks     Interim History:  Mr.  Burns is coming in for followup. I thought that we might have to treat him with Velcade. However, I think that we can probably hold off on this for right now. I do want to do another 24-hour urine on him.  His last serum studies showed that his M spike was 0.37 g/dL which is improved. His IgG and lambda levels both decreased.  He feels well. He has some tingling in his feet. This may be neuropathy from treatments. It may be neuropathy from his radiation. It also might be an indicator of some involvement by plasma cytoma.  He's had some swelling in his legs. His left leg swells more than the right leg. I will give him some Zaroxolyn to try. We have already done a Doppler of his legs and this was negative for any thromboembolic disease. He is on aspirin.  He's had no cough. He's had no real increase in back pain. He's had a good appetite. He's had no fever. There's been no bleeding.  Medications: Current outpatient prescriptions:aspirin EC 81 MG tablet, Take 81 mg by mouth daily., Disp: , Rfl: ;  Calcium-Vitamin D-Vitamin K (CALCIUM + D) [id number] MG-UNT-MCG CHEW, Chew by mouth every morning., Disp: , Rfl: ;  Cyanocobalamin (VITAMIN B 12 PO), Take 1,000 mg by mouth every morning., Disp: , Rfl: ;  Multiple Vitamins-Minerals (MULTIVITAL PO), Take 1 tablet by mouth every morning. , Disp: , Rfl:  pomalidomide (POMALYST) 3 MG capsule, Take 1 capsule (3 mg total) by mouth daily. Take with water on days 1-21. Repeat every 28 days. Auth# [id number], Disp: 30 capsule, Rfl: 0;  Pyridoxine HCl (VITAMIN B-6) 250 MG tablet, Take 1 tablet (250 mg total) by mouth daily., Disp: 30 tablet, Rfl: 12;  Vitamin D, Ergocalciferol, (DRISDOL) 50000 UNITS  CAPS capsule, Take 50,000 Units by mouth every 7 (seven) days., Disp: , Rfl:  metolazone (ZAROXOLYN) 2.5 MG tablet, Take1/2 tablet daily for 5 days, then only as needed for leg swelling., Disp: 20 tablet, Rfl: 2 No current facility-administered medications for this visit. Facility-Administered Medications Ordered in Other Visits: alteplase (CATHFLO ACTIVASE) injection 2 mg, 2 mg, Intracatheter, Once PRN, Christian Napoleon, MD;  heparin lock flush 100 unit/mL, 500 Units, Intracatheter, Once PRN, Christian Napoleon, MD;  heparin lock flush 100 unit/mL, 250 Units, Intracatheter, Once PRN, Christian Napoleon, MD;  sodium chloride 0.9 % injection 3 mL, 3 mL, Intravenous, Once PRN, Christian Napoleon, MD zolendronic acid (ZOMETA) 3.3 mg in sodium chloride 0.9 % 100 mL IVPB, 3.3 mg, Intravenous, Once, Helayne Seminole, PA-C, Last Rate: 416.5 mL/hr at 11/04/13 1417, 3.3 mg at 11/04/13 1417  Allergies:  Allergies  Allergen Reactions  . Sulfa Antibiotics Itching    Past Medical History, Surgical history, Social history, and Family History were reviewed and updated.  Review of Systems: As above  Physical Exam:  height is 5\' 8"  (1.727 m) and weight is 158 lb (71.668 kg). His oral temperature is 97.8 F (36.6 C). His blood pressure is 137/77 and his pulse is 63. His respiration is 18.   Thin white gentleman. Lungs are clear. Cardiac exam regular rate and rhythm with no murmurs rubs or bruits. Abdomen soft. Has  good bowel sounds. There is no palpable liver or spleen tip. Back exam no tenderness over the spine ribs or hips. No spasms are noted in the back. Extremities shows some 2+ edema in his left lower leg. He has 1+ edema in the right lower leg. He has good range of motion of his joints. Skin shows no ecchymoses or petechia. Neurological exam shows no focal neurological deficits.  Lab Results  Component Value Date   WBC 3.3* 11/04/2013   HGB 10.5* 11/04/2013   HCT 30.8* 11/04/2013   MCV 93 11/04/2013   PLT 176  11/04/2013     Chemistry      Component Value Date/Time   NA 139 10/15/2013 0912   NA 141 07/24/2012 0940   K 3.8 10/15/2013 0912   K 4.1 07/24/2012 0940   CL 107 10/15/2013 0912   CL 107 07/24/2012 0940   CO2 27 10/15/2013 0912   CO2 26 07/24/2012 0940   BUN 14 10/15/2013 0912   BUN 20 07/24/2012 0940   CREATININE 1.3* 10/15/2013 0912   CREATININE 1.28 06/16/2013 1400      Component Value Date/Time   CALCIUM 8.8 10/15/2013 0912   CALCIUM 8.7 07/24/2012 0940   ALKPHOS 72 10/15/2013 0912   ALKPHOS 87 07/24/2012 0940   AST 21 10/15/2013 0912   AST 18 07/24/2012 0940   ALT 12 10/15/2013 0912   ALT 19 07/24/2012 0940   BILITOT 1.00 10/15/2013 0912   BILITOT 1.6* 07/24/2012 0940         Impression and Plan: Christian Burns is an 80 year old gentleman. He has recurrent myeloma. He's on Pomalidomide. He's doing okay with this.  We will do another 24-hour urine on him. I think this 1 was show [place] whether or not we have to do any Velcade.  Will plan to get him back in about 4 weeks or so.  Will do his Zometa today.   Christian Napoleon, MD 3/26/20152:31 PM

## 2013-11-08 LAB — PROTEIN ELECTROPHORESIS, SERUM, WITH REFLEX
ALBUMIN ELP: 57.7 % (ref 55.8–66.1)
Alpha-1-Globulin: 4.7 % (ref 2.9–4.9)
Alpha-2-Globulin: 10.8 % (ref 7.1–11.8)
Beta 2: 4.5 % (ref 3.2–6.5)
Beta Globulin: 6.2 % (ref 4.7–7.2)
Gamma Globulin: 16.1 % (ref 11.1–18.8)
M-Spike, %: 0.38 g/dL
Total Protein, Serum Electrophoresis: 5.9 g/dL — ABNORMAL LOW (ref 6.0–8.3)

## 2013-11-08 LAB — KAPPA/LAMBDA LIGHT CHAINS
KAPPA LAMBDA RATIO: 0.47 (ref 0.26–1.65)
Kappa free light chain: 2.59 mg/dL — ABNORMAL HIGH (ref 0.33–1.94)
Lambda Free Lght Chn: 5.48 mg/dL — ABNORMAL HIGH (ref 0.57–2.63)

## 2013-11-08 LAB — IFE INTERPRETATION

## 2013-11-08 LAB — LACTATE DEHYDROGENASE: LDH: 175 U/L (ref 94–250)

## 2013-11-08 LAB — IGG, IGA, IGM
IGA: 207 mg/dL (ref 68–379)
IGM, SERUM: 88 mg/dL (ref 41–251)
IgG (Immunoglobin G), Serum: 969 mg/dL (ref 650–1600)

## 2013-11-09 LAB — UIFE/LIGHT CHAINS/TP QN, 24-HR UR
ALPHA 1 UR: DETECTED — AB
ALPHA 2 UR: DETECTED — AB
Albumin, U: DETECTED
Beta, Urine: DETECTED — AB
Free Kappa Lt Chains,Ur: 8.56 mg/dL — ABNORMAL HIGH (ref 0.14–2.42)
Free Kappa/Lambda Ratio: 6.03 ratio (ref 2.04–10.37)
Free Lambda Excretion/Day: 18.46 mg/d
Free Lambda Lt Chains,Ur: 1.42 mg/dL — ABNORMAL HIGH (ref 0.02–0.67)
Free Lt Chn Excr Rate: 111.28 mg/d
Gamma Globulin, Urine: DETECTED — AB
TOTAL PROTEIN, URINE-UPE24: 20.3 mg/dL
TOTAL PROTEIN, URINE-UR/DAY: 264 mg/d — AB (ref 10–140)
Time: 24 hours
Volume, Urine: 1300 mL

## 2013-11-10 ENCOUNTER — Telehealth: Payer: Self-pay

## 2013-11-10 NOTE — Telephone Encounter (Addendum)
Message copied by Johny Drilling on Wed Nov 10, 2013 11:30 AM ------      Message from: [name]      Created: Tue Nov 09, 2013  8:55 PM       Call - urine and blood tests are stable!!  No need to change your myeloma therapy!!  Happy Ivor Costa! [name] ------ Message left on generic VM to contact our office for lab results. dph

## 2013-11-10 NOTE — Telephone Encounter (Signed)
Pt returned call for lab results. Verbalizes understanding. dph

## 2013-11-11 ENCOUNTER — Ambulatory Visit: Payer: Medicare Other

## 2013-11-11 ENCOUNTER — Other Ambulatory Visit: Payer: Medicare Other | Admitting: Lab

## 2013-11-18 ENCOUNTER — Other Ambulatory Visit: Payer: Self-pay | Admitting: *Deleted

## 2013-11-18 ENCOUNTER — Ambulatory Visit: Payer: Medicare Other

## 2013-11-18 ENCOUNTER — Other Ambulatory Visit: Payer: Medicare Other | Admitting: Lab

## 2013-11-18 MED ORDER — POMALIDOMIDE 3 MG PO CAPS: 3.0000 mg | ORAL_CAPSULE | Freq: Every day | ORAL | Status: DC

## 2013-11-26 ENCOUNTER — Other Ambulatory Visit: Payer: Self-pay | Admitting: Pharmacist

## 2013-11-26 DIAGNOSIS — C9000 Multiple myeloma not having achieved remission: Secondary | ICD-10-CM

## 2013-11-29 ENCOUNTER — Emergency Department (HOSPITAL_COMMUNITY)
Admission: EM | Admit: 2013-11-29 | Discharge: 2013-11-29 | Disposition: A | Discharge status: Home / Self Care | Payer: Medicare HMO | Attending: Emergency Medicine | Admitting: Emergency Medicine

## 2013-11-29 ENCOUNTER — Encounter (HOSPITAL_COMMUNITY): Payer: Self-pay | Admitting: Emergency Medicine

## 2013-11-29 DIAGNOSIS — N289 Disorder of kidney and ureter, unspecified: Secondary | ICD-10-CM

## 2013-11-29 DIAGNOSIS — Z79899 Other long term (current) drug therapy: Secondary | ICD-10-CM | POA: Insufficient documentation

## 2013-11-29 DIAGNOSIS — Z7982 Long term (current) use of aspirin: Secondary | ICD-10-CM | POA: Insufficient documentation

## 2013-11-29 DIAGNOSIS — E860 Dehydration: Secondary | ICD-10-CM | POA: Insufficient documentation

## 2013-11-29 DIAGNOSIS — Z87898 Personal history of other specified conditions: Secondary | ICD-10-CM | POA: Insufficient documentation

## 2013-11-29 DIAGNOSIS — R110 Nausea: Secondary | ICD-10-CM | POA: Insufficient documentation

## 2013-11-29 DIAGNOSIS — Z923 Personal history of irradiation: Secondary | ICD-10-CM | POA: Insufficient documentation

## 2013-11-29 LAB — CBC WITH DIFFERENTIAL/PLATELET
Basophils Absolute: 0.1 10*3/uL (ref 0.0–0.1)
Basophils Relative: 3 % — ABNORMAL HIGH (ref 0–1)
EOS PCT: 6 % — AB (ref 0–5)
Eosinophils Absolute: 0.2 10*3/uL (ref 0.0–0.7)
HCT: 30.1 % — ABNORMAL LOW (ref 39.0–52.0)
Hemoglobin: 10.4 g/dL — ABNORMAL LOW (ref 13.0–17.0)
Lymphocytes Relative: 16 % (ref 12–46)
Lymphs Abs: 0.5 10*3/uL — ABNORMAL LOW (ref 0.7–4.0)
MCH: 30.7 pg (ref 26.0–34.0)
MCHC: 34.6 g/dL (ref 30.0–36.0)
MCV: 88.8 fL (ref 78.0–100.0)
MONOS PCT: 22 % — AB (ref 3–12)
Monocytes Absolute: 0.7 10*3/uL (ref 0.1–1.0)
NEUTROS PCT: 53 % (ref 43–77)
Neutro Abs: 1.7 10*3/uL (ref 1.7–7.7)
PLATELETS: 112 10*3/uL — AB (ref 150–400)
RBC: 3.39 MIL/uL — ABNORMAL LOW (ref 4.22–5.81)
RDW: 14.6 % (ref 11.5–15.5)
WBC: 3.2 10*3/uL — AB (ref 4.0–10.5)

## 2013-11-29 LAB — BASIC METABOLIC PANEL
BUN: 27 mg/dL — ABNORMAL HIGH (ref 6–23)
CHLORIDE: 106 meq/L (ref 96–112)
CO2: 23 mEq/L (ref 19–32)
CREATININE: 1.74 mg/dL — AB (ref 0.50–1.35)
Calcium: 9 mg/dL (ref 8.4–10.5)
GFR calc Af Amer: 41 mL/min — ABNORMAL LOW (ref >90)
GFR calc non Af Amer: 35 mL/min — ABNORMAL LOW (ref >90)
Glucose, Bld: 122 mg/dL — ABNORMAL HIGH (ref 70–99)
Potassium: 4.4 mEq/L (ref 3.7–5.3)
Sodium: 141 mEq/L (ref 137–147)

## 2013-11-29 MED ORDER — SODIUM CHLORIDE 0.9 % IV BOLUS (SEPSIS)
500.0000 mL | Freq: Once | INTRAVENOUS | Status: AC
  Administered 2013-11-29: 500 mL via INTRAVENOUS

## 2013-11-29 MED ORDER — SODIUM CHLORIDE 0.9 % IV SOLN
INTRAVENOUS | Status: DC
  Administered 2013-11-29: 14:00:00 via INTRAVENOUS

## 2013-11-29 NOTE — ED Notes (Signed)
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 Pt layed down on a bed- pt never had LOC. Pt was diaphoretic/pale at the time. No vomiting, no pain. Pt recently started on lasix yesterday. Orthostatics from EMS: laying 104/70 HR 58, sitting 110/64 HR 70, standing 90/58 HR 70. CBG 121.

## 2013-11-29 NOTE — ED Provider Notes (Signed)
CSN: [id number]     Arrival date & time 11/29/13  1153 History   First MD Initiated Contact with Patient 11/29/13 1208     Chief Complaint  Patient presents with  . Near Syncope     (Consider location/radiation/quality/duration/timing/severity/associated sxs/prior Treatment) Patient is a 80 y.o. male presenting with near-syncope. The history is provided by the patient.  Near Syncope   patient here complaining of nausea and lightheadedness while standing today. Recently started on a diuretic yesterday. Denies any chest pain shortness of breath. Has nausea. No diarrhea. No fever chills. Symptoms are better when he sat down. EMS was called and he was orthostatic. CBG was 121. He feels better at this time. No IV fluids given and patient transported her  Past Medical History  Diagnosis Date  . Multiple myeloma Sept 2010  . History of radiation therapy 06/17/13-07/06/13    37 gray to upper lumbar spine   Past Surgical History  Procedure Laterality Date  . Tonsillectomy     History reviewed. No pertinent family history. History  Substance Use Topics  . Smoking status: Never Smoker   . Smokeless tobacco: Never Used     Comment: never used tobacco  . Alcohol Use: Yes     Comment: Wine Occassionally    Review of Systems  Cardiovascular: Positive for near-syncope.  All other systems reviewed and are negative.     Allergies  Sulfa antibiotics  Home Medications   Prior to Admission medications   Medication Sig Start Date End Date Taking? Authorizing Provider  aspirin EC 81 MG tablet Take 81 mg by mouth daily. 06/17/13   [name], MD  Calcium-Vitamin D-Vitamin K (CALCIUM + D) [phone] MG-UNT-MCG CHEW Chew by mouth every morning.    Historical Provider, MD  Cyanocobalamin (VITAMIN B 12 PO) Take 1,000 mg by mouth every morning.    Historical Provider, MD  metolazone (ZAROXOLYN) 2.5 MG tablet Take1/2 tablet daily for 5 days, then only as needed for leg swelling. 11/04/13    [name], MD  Multiple Vitamins-Minerals (MULTIVITAL PO) Take 1 tablet by mouth every morning.     Historical Provider, MD  pomalidomide (POMALYST) 3 MG capsule Take 1 capsule (3 mg total) by mouth daily. Take with water on days 1-21. Repeat every 28 days. Auth# [id number] 11/18/13   [name], MD  Pyridoxine HCl (VITAMIN B-6) 250 MG tablet Take 1 tablet (250 mg total) by mouth daily. 09/17/13   [name], MD  Vitamin D, Ergocalciferol, (DRISDOL) 50000 UNITS CAPS capsule Take 50,000 Units by mouth every 7 (seven) days.    Historical Provider, MD   BP 141/74  Temp(Src) 97.5 F (36.4 C) (Oral)  Resp 15  SpO2 100% Physical Exam  Nursing note and vitals reviewed. Constitutional: He is oriented to person, place, and time. He appears well-developed and well-nourished.  Non-toxic appearance. No distress.  HENT:  Head: Normocephalic and atraumatic.  Eyes: Conjunctivae, EOM and lids are normal. Pupils are equal, round, and reactive to light.  Neck: Normal range of motion. Neck supple. No tracheal deviation present. No mass present.  Cardiovascular: Normal rate, regular rhythm and normal heart sounds.  Exam reveals no gallop.   No murmur heard. Pulmonary/Chest: Effort normal and breath sounds normal. No stridor. No respiratory distress. He has no decreased breath sounds. He has no wheezes. He has no rhonchi. He has no rales.  Abdominal: Soft. Normal appearance and bowel sounds are normal. He exhibits no distension. There is no  tenderness. There is no rebound and no CVA tenderness.  Musculoskeletal: Normal range of motion. He exhibits no edema and no tenderness.  Neurological: He is alert and oriented to person, place, and time. He has normal strength. No cranial nerve deficit or sensory deficit. GCS eye subscore is 4. GCS verbal subscore is 5. GCS motor subscore is 6.  Skin: Skin is warm and dry. No abrasion and no rash noted.  Psychiatric: He has a normal mood and affect. His speech is  normal and behavior is normal.    ED Course  Procedures (including critical care time) Labs Review Labs Reviewed  CBC WITH DIFFERENTIAL  BASIC METABOLIC PANEL    Imaging Review No results found.   EKG Interpretation None      MDM   Final diagnoses:  None    Patient given IV fluids for his dehydration caused by his use of diuretics. He feels better at this time. He is able to ambulate without difficulty and is due for discharge. He was told to hold his diuretics at this time    Leota Jacobsen, MD 11/29/13 1454

## 2013-11-29 NOTE — Discharge Instructions (Signed)
Do not take any of your diuretics. Drink plenty of liquids. Your creatinine today was 1.74 and this needs to be followed up by your Dr.

## 2013-12-03 ENCOUNTER — Encounter: Payer: Self-pay | Admitting: Hematology & Oncology

## 2013-12-03 ENCOUNTER — Ambulatory Visit: Payer: Medicare Other

## 2013-12-03 ENCOUNTER — Ambulatory Visit (HOSPITAL_BASED_OUTPATIENT_CLINIC_OR_DEPARTMENT_OTHER): Payer: Medicare HMO | Admitting: Hematology & Oncology

## 2013-12-03 ENCOUNTER — Ambulatory Visit (HOSPITAL_BASED_OUTPATIENT_CLINIC_OR_DEPARTMENT_OTHER): Payer: Medicare HMO | Admitting: Lab

## 2013-12-03 VITALS — BP 154/79 | HR 72 | Temp 97.5°F | Resp 16 | Ht 69.0 in | Wt 150.0 lb

## 2013-12-03 DIAGNOSIS — C9002 Multiple myeloma in relapse: Secondary | ICD-10-CM

## 2013-12-03 DIAGNOSIS — C9000 Multiple myeloma not having achieved remission: Secondary | ICD-10-CM

## 2013-12-03 DIAGNOSIS — N289 Disorder of kidney and ureter, unspecified: Secondary | ICD-10-CM

## 2013-12-03 LAB — CBC WITH DIFFERENTIAL (CANCER CENTER ONLY)
BASO#: 0.1 10*3/uL (ref 0.0–0.2)
BASO%: 2 % (ref 0.0–2.0)
EOS ABS: 0.1 10*3/uL (ref 0.0–0.5)
EOS%: 2 % (ref 0.0–7.0)
HEMATOCRIT: 31.1 % — AB (ref 38.7–49.9)
HGB: 10.7 g/dL — ABNORMAL LOW (ref 13.0–17.1)
LYMPH#: 0.5 10*3/uL — ABNORMAL LOW (ref 0.9–3.3)
LYMPH%: 12.5 % — AB (ref 14.0–48.0)
MCH: 30.9 pg (ref 28.0–33.4)
MCHC: 34.4 g/dL (ref 32.0–35.9)
MCV: 90 fL (ref 82–98)
MONO#: 1 10*3/uL — AB (ref 0.1–0.9)
MONO%: 25.1 % — ABNORMAL HIGH (ref 0.0–13.0)
NEUT#: 2.3 10*3/uL (ref 1.5–6.5)
NEUT%: 58.4 % (ref 40.0–80.0)
Platelets: 175 10*3/uL (ref 145–400)
RBC: 3.46 10*6/uL — ABNORMAL LOW (ref 4.20–5.70)
RDW: 14.4 % (ref 11.1–15.7)
WBC: 3.9 10*3/uL — AB (ref 4.0–10.0)

## 2013-12-03 LAB — CMP (CANCER CENTER ONLY)
ALBUMIN: 3.3 g/dL (ref 3.3–5.5)
ALK PHOS: 99 U/L — AB (ref 26–84)
ALT(SGPT): 16 U/L (ref 10–47)
AST: 18 U/L (ref 11–38)
BUN, Bld: 23 mg/dL — ABNORMAL HIGH (ref 7–22)
CO2: 31 mEq/L (ref 18–33)
Calcium: 8.8 mg/dL (ref 8.0–10.3)
Chloride: 103 mEq/L (ref 98–108)
Creat: 1.8 mg/dl — ABNORMAL HIGH (ref 0.6–1.2)
Glucose, Bld: 129 mg/dL — ABNORMAL HIGH (ref 73–118)
Potassium: 3.8 mEq/L (ref 3.3–4.7)
SODIUM: 145 meq/L (ref 128–145)
TOTAL PROTEIN: 7 g/dL (ref 6.4–8.1)
Total Bilirubin: 1.1 mg/dl (ref 0.20–1.60)

## 2013-12-03 MED ORDER — HYDROCODONE-ACETAMINOPHEN 5-325 MG PO TABS: 1.0000 | ORAL_TABLET | Freq: Four times a day (QID) | ORAL | Status: DC | PRN

## 2013-12-03 NOTE — Progress Notes (Signed)
Seen by Dr. Ennever today, Zometa held.  

## 2013-12-03 NOTE — Progress Notes (Unsigned)
Seen by Dr. Marin Olp today, Zometa held.

## 2013-12-05 NOTE — Progress Notes (Signed)
Hematology and Oncology Follow Up Visit  Christian Burns [id number] [birth year] 80 y.o. 12/05/2013   Principle Diagnosis:  Recurrent IgG lambda myeloma Current Therapy:    Pomalidomide 2 mg by mouth daily (21/7)  Zometa 3.3 mg IV every 4 weeks     Interim History:  Mr.  Burns is back for followup. He was in the emergency room earlier this week. It sounds like a vasovagal reaction. He began to have sweating . He felt lightheaded. He was told that he turned very pale.  In the emergency room, his given some IV fluids. Everything checked out okay for his heart. He did take some Zaroxolyn because of lower extremity swelling. Her potassium was not low.  His last urine studies showed his lambda light chain of 18.5 mg per day. His last monoclonal spike was 0.3 mg/dL. IgG was 969 mg per day. These were holding stable.  His renal function has gone up a little bit. We are holding his Zometa today.  He is complaining more in the way of lower back pain.  His last MRI was done back in January. I have to repeat this.  He's had no problems bowels or bladder. There's been no diarrhea. He's had no leg weakness. He's had occasional leg swelling. He's had no rashes. There is no cough. He's had no fever.  Medications: Current outpatient prescriptions:aspirin EC 81 MG tablet, Take 162 mg by mouth daily. , Disp: , Rfl: ;  Calcium-Vitamin D-Vitamin K (CALCIUM + D) [phone] MG-UNT-MCG CHEW, Chew 1 tablet by mouth every morning. , Disp: , Rfl: ;  Cyanocobalamin (VITAMIN B 12 PO), Take 1,000 mg by mouth every morning., Disp: , Rfl: ;  metolazone (ZAROXOLYN) 2.5 MG tablet, Take 2.5 mg by mouth as needed. Takes 1/2 tab prn, Disp: , Rfl:  Multiple Vitamins-Minerals (MULTIVITAL PO), Take 1 tablet by mouth every morning. , Disp: , Rfl: ;  pomalidomide (POMALYST) 3 MG capsule, Take 1 capsule (3 mg total) by mouth daily. Take with water on days 1-21. Repeat every 28 days. Auth# [id number], Disp: 30 capsule, Rfl: 0;   Pyridoxine HCl (VITAMIN B-6) 250 MG tablet, Take 500 mg by mouth daily., Disp: , Rfl:  Vitamin D, Ergocalciferol, (DRISDOL) 50000 UNITS CAPS capsule, Take 50,000 Units by mouth every 7 (seven) days. Sundays, Disp: , Rfl: ;  HYDROcodone-acetaminophen (NORCO/VICODIN) 5-325 MG per tablet, Take 1 tablet by mouth every 6 (six) hours as needed for moderate pain., Disp: 60 tablet, Rfl: 0  Allergies:  Allergies  Allergen Reactions  . Sulfa Antibiotics Itching    Past Medical History, Surgical history, Social history, and Family History were reviewed and updated.  Review of Systems: As above  Physical Exam:  height is 5\' 9"  (1.753 m) and weight is 150 lb (68.04 kg). His oral temperature is 97.5 F (36.4 C). His blood pressure is 154/79 and his pulse is 72. His respiration is 16.   Elderly gentleman. Lungs are clear. Cardiac exam regular rate and rhythm with a 1/6 systolic murmur. Abdomen soft. Has good bowel sounds. There is no fluid wave. There is no palpable liver or spleen tip. Back exam no tenderness over the spine. Is some tenderness over the sacroiliac region bilaterally. The swelling is noted. Extremities shows good strength. Good range of motion. He is age-related changes. Skin exam no rashes. Neurological exam nonfocal.  Lab Results  Component Value Date   WBC 3.9* 12/03/2013   HGB 10.7* 12/03/2013   HCT 31.1* 12/03/2013   MCV 90  12/03/2013   PLT 175 12/03/2013     Chemistry      Component Value Date/Time   NA 145 12/03/2013 1151   NA 141 11/29/2013 1216   K 3.8 12/03/2013 1151   K 4.4 11/29/2013 1216   CL 103 12/03/2013 1151   CL 106 11/29/2013 1216   CO2 31 12/03/2013 1151   CO2 23 11/29/2013 1216   BUN 23* 12/03/2013 1151   BUN 27* 11/29/2013 1216   CREATININE 1.8* 12/03/2013 1151   CREATININE 1.74* 11/29/2013 1216      Component Value Date/Time   CALCIUM 8.8 12/03/2013 1151   CALCIUM 9.0 11/29/2013 1216   ALKPHOS 99* 12/03/2013 1151   ALKPHOS 87 07/24/2012 0940   AST 18 12/03/2013  1151   AST 18 07/24/2012 0940   ALT 16 12/03/2013 1151   ALT 19 07/24/2012 0940   BILITOT 1.10 12/03/2013 1151   BILITOT 1.6* 07/24/2012 0940         Impression and Plan: Christian Burns is 80 year old gentleman with recurrent IgG lambda myeloma. He is on Pomalidomide. Am not sure as to why he had a vasovagal reaction. Also lowered about his kidney function. He's not taking the diuretic every day so I would not think is dehydrated. However, told him to increase his fluid amount.  We will go ahead and hold the Zometa. We will see what his MRI shows.  I want him back in a month, or sooner depending on what we find with the MRI.   [name], MD 4/26/20157:44 AM

## 2013-12-08 LAB — LACTATE DEHYDROGENASE: LDH: 148 U/L (ref 94–250)

## 2013-12-08 LAB — PROTEIN ELECTROPHORESIS, SERUM, WITH REFLEX
ALBUMIN ELP: 54.3 % — AB (ref 55.8–66.1)
Alpha-1-Globulin: 5.9 % — ABNORMAL HIGH (ref 2.9–4.9)
Alpha-2-Globulin: 12.4 % — ABNORMAL HIGH (ref 7.1–11.8)
Beta 2: 4.9 % (ref 3.2–6.5)
Beta Globulin: 6.3 % (ref 4.7–7.2)
Gamma Globulin: 16.2 % (ref 11.1–18.8)
M-Spike, %: 0.37 g/dL
TOTAL PROTEIN, SERUM ELECTROPHOR: 6.2 g/dL (ref 6.0–8.3)

## 2013-12-08 LAB — IGG, IGA, IGM
IgA: 212 mg/dL (ref 68–379)
IgG (Immunoglobin G), Serum: 983 mg/dL (ref 650–1600)
IgM, Serum: 70 mg/dL (ref 41–251)

## 2013-12-08 LAB — KAPPA/LAMBDA LIGHT CHAINS
KAPPA FREE LGHT CHN: 2.93 mg/dL — AB (ref 0.33–1.94)
Kappa:Lambda Ratio: 0.55 (ref 0.26–1.65)
LAMBDA FREE LGHT CHN: 5.33 mg/dL — AB (ref 0.57–2.63)

## 2013-12-08 LAB — IFE INTERPRETATION

## 2013-12-09 ENCOUNTER — Ambulatory Visit: Payer: Medicare Other

## 2013-12-09 ENCOUNTER — Other Ambulatory Visit: Payer: Medicare Other | Admitting: Lab

## 2013-12-16 ENCOUNTER — Ambulatory Visit: Payer: Medicare Other

## 2013-12-16 ENCOUNTER — Other Ambulatory Visit: Payer: Medicare Other | Admitting: Lab

## 2013-12-17 ENCOUNTER — Ambulatory Visit (HOSPITAL_COMMUNITY): Payer: Medicare Other

## 2013-12-24 ENCOUNTER — Other Ambulatory Visit: Payer: Self-pay | Admitting: *Deleted

## 2013-12-24 DIAGNOSIS — C9000 Multiple myeloma not having achieved remission: Secondary | ICD-10-CM

## 2013-12-24 MED ORDER — POMALIDOMIDE 3 MG PO CAPS: 3.0000 mg | ORAL_CAPSULE | Freq: Every day | ORAL | Status: DC

## 2013-12-31 ENCOUNTER — Ambulatory Visit (HOSPITAL_COMMUNITY)
Admission: RE | Admit: 2013-12-31 | Discharge: 2013-12-31 | Disposition: A | Discharge status: Home / Self Care | Payer: Medicare HMO | Source: Ambulatory Visit | Attending: Hematology & Oncology | Admitting: Hematology & Oncology

## 2013-12-31 DIAGNOSIS — R209 Unspecified disturbances of skin sensation: Secondary | ICD-10-CM | POA: Insufficient documentation

## 2013-12-31 DIAGNOSIS — M5126 Other intervertebral disc displacement, lumbar region: Secondary | ICD-10-CM | POA: Insufficient documentation

## 2013-12-31 DIAGNOSIS — M545 Low back pain, unspecified: Secondary | ICD-10-CM | POA: Insufficient documentation

## 2013-12-31 DIAGNOSIS — C9000 Multiple myeloma not having achieved remission: Secondary | ICD-10-CM

## 2014-01-14 ENCOUNTER — Encounter: Payer: Self-pay | Admitting: Hematology & Oncology

## 2014-01-14 ENCOUNTER — Ambulatory Visit (HOSPITAL_BASED_OUTPATIENT_CLINIC_OR_DEPARTMENT_OTHER): Payer: Medicare HMO | Admitting: Lab

## 2014-01-14 ENCOUNTER — Ambulatory Visit (HOSPITAL_BASED_OUTPATIENT_CLINIC_OR_DEPARTMENT_OTHER): Payer: Medicare HMO | Admitting: Hematology & Oncology

## 2014-01-14 VITALS — BP 136/69 | HR 67 | Temp 97.8°F | Resp 18 | Ht 69.0 in | Wt 152.0 lb

## 2014-01-14 DIAGNOSIS — C9000 Multiple myeloma not having achieved remission: Secondary | ICD-10-CM

## 2014-01-14 LAB — CBC WITH DIFFERENTIAL (CANCER CENTER ONLY)
BASO#: 0.1 10*3/uL (ref 0.0–0.2)
BASO%: 2.6 % — AB (ref 0.0–2.0)
EOS%: 4.3 % (ref 0.0–7.0)
Eosinophils Absolute: 0.2 10*3/uL (ref 0.0–0.5)
HEMATOCRIT: 32.4 % — AB (ref 38.7–49.9)
HGB: 11.1 g/dL — ABNORMAL LOW (ref 13.0–17.1)
LYMPH#: 0.5 10*3/uL — ABNORMAL LOW (ref 0.9–3.3)
LYMPH%: 13.1 % — ABNORMAL LOW (ref 14.0–48.0)
MCH: 31.4 pg (ref 28.0–33.4)
MCHC: 34.3 g/dL (ref 32.0–35.9)
MCV: 92 fL (ref 82–98)
MONO#: 0.5 10*3/uL (ref 0.1–0.9)
MONO%: 15.4 % — AB (ref 0.0–13.0)
NEUT#: 2.3 10*3/uL (ref 1.5–6.5)
NEUT%: 64.6 % (ref 40.0–80.0)
PLATELETS: 169 10*3/uL (ref 145–400)
RBC: 3.54 10*6/uL — ABNORMAL LOW (ref 4.20–5.70)
RDW: 15.9 % — AB (ref 11.1–15.7)
WBC: 3.5 10*3/uL — ABNORMAL LOW (ref 4.0–10.0)

## 2014-01-17 NOTE — Progress Notes (Signed)
Hematology and Oncology Follow Up Visit  Christian Burns [id number] [birth year] 80 y.o. 01/17/2014   Principle Diagnosis:   Recurrent IgG lambda myeloma  Current Therapy:    Pomalidomide 2 mg by mouth daily (21/7)  Zometa 3.3 mg IV q. 4 weeks     Interim History:  Mr.  Burns is back for followup. We did go ahead and do his MRI of the lower back. This showed stable changes. He had a compression fracture at L2. This was unchanged. There is some heterotogenious marrow signal throughout the lumbar spine. This may have been from his radiation. His last myeloma studies back in April showed a monoclonal spike of 0.37 g/dL. His I IgG level was 183 mg/dL. Lambda light chain was 5.33 mg/dL.  His back is not hurting much at all right now. He is quite functional. He's eating well. He's having no nausea. He's had no fever. He's had no further episodes of syncope. He has not had leg swelling.   Medications: Current outpatient prescriptions:aspirin EC 81 MG tablet, Take 162 mg by mouth daily. , Disp: , Rfl: ;  Calcium-Vitamin D-Vitamin K (CALCIUM + D) [id number] MG-UNT-MCG CHEW, Chew 1 tablet by mouth every morning. , Disp: , Rfl: ;  Cyanocobalamin (VITAMIN B 12 PO), Take 1,000 mg by mouth every morning., Disp: , Rfl: ;  Multiple Vitamins-Minerals (MULTIVITAL PO), Take 1 tablet by mouth every morning. , Disp: , Rfl:  pomalidomide (POMALYST) 3 MG capsule, Take 1 capsule (3 mg total) by mouth daily. Take with water on days 1-21. Repeat every 28 days. Auth# [id number], Disp: 30 capsule, Rfl: 0;  Pyridoxine HCl (VITAMIN B-6) 250 MG tablet, Take 500 mg by mouth daily., Disp: , Rfl: ;  Vitamin D, Ergocalciferol, (DRISDOL) 50000 UNITS CAPS capsule, Take 50,000 Units by mouth every 7 (seven) days. Sundays, Disp: , Rfl:   Allergies:  Allergies  Allergen Reactions  . Sulfa Antibiotics Itching    Past Medical History, Surgical history, Social history, and Family History were reviewed and updated.  Review of  Systems: As above  Physical Exam:  height is 5\' 9"  (1.753 m) and weight is 152 lb (68.947 kg). His oral temperature is 97.8 F (36.6 C). His blood pressure is 136/69 and his pulse is 67. His respiration is 18.   Thin, elderly gentleman. He is no distress. Head and neck exam shows no ocular or oral lesions. He has no palpable cervical or supraclavicular lymph nodes. Lungs are clear bilaterally. Cardiac exam is regular in rhythm with no murmurs rubs or bruits. Abdomen is soft. He has good bowel sounds. There is no fluid wave. There is no palpable liver or spleen. Back exam no tenderness over the spine ribs or hips. He has some kyphosis. Extremities shows some age related changes. He has good range of motion and strength. Skin exam shows no rashes. Neurological exam is nonfocal.  Lab Results  Component Value Date   WBC 3.5* 01/14/2014   HGB 11.1* 01/14/2014   HCT 32.4* 01/14/2014   MCV 92 01/14/2014   PLT 169 01/14/2014     Chemistry      Component Value Date/Time   NA 140 01/14/2014 1530   NA 145 12/03/2013 1151   K 4.3 01/14/2014 1530   K 3.8 12/03/2013 1151   CL 105 01/14/2014 1530   CL 103 12/03/2013 1151   CO2 26 01/14/2014 1530   CO2 31 12/03/2013 1151   BUN 26* 01/14/2014 1530   BUN 23* 12/03/2013 1151  CREATININE 1.48* 01/14/2014 1530   CREATININE 1.8* 12/03/2013 1151      Component Value Date/Time   CALCIUM 8.8 01/14/2014 1530   CALCIUM 8.8 12/03/2013 1151   ALKPHOS 106 01/14/2014 1530   ALKPHOS 99* 12/03/2013 1151   AST 17 01/14/2014 1530   AST 18 12/03/2013 1151   ALT 14 01/14/2014 1530   ALT 16 12/03/2013 1151   BILITOT 1.0 01/14/2014 1530   BILITOT 1.10 12/03/2013 1151         Impression and Plan: Christian Burns is 80 year old gentleman with IgG lambda myeloma. He's on Pomalidomide. He's doing fairly well from my point of view. We will continue to follow his myeloma studies. I am glad that he is is not having symptoms with his back. He has his changes that are stable.  For now, will hold the  Zometa.  I want to see him back in another 6 weeks.  If we find that his myeloma levels continue to rise, we were probably going to have to at [facility] or Kyprolis to the regimen.   [name], MD 6/8/20156:12 AM

## 2014-01-18 ENCOUNTER — Telehealth: Payer: Self-pay | Admitting: Hematology & Oncology

## 2014-01-18 LAB — COMPREHENSIVE METABOLIC PANEL
ALT: 14 U/L (ref 0–53)
AST: 17 U/L (ref 0–37)
Albumin: 3.7 g/dL (ref 3.5–5.2)
Alkaline Phosphatase: 106 U/L (ref 39–117)
BUN: 26 mg/dL — ABNORMAL HIGH (ref 6–23)
CO2: 26 meq/L (ref 19–32)
CREATININE: 1.48 mg/dL — AB (ref 0.50–1.35)
Calcium: 8.8 mg/dL (ref 8.4–10.5)
Chloride: 105 mEq/L (ref 96–112)
Glucose, Bld: 82 mg/dL (ref 70–99)
Potassium: 4.3 mEq/L (ref 3.5–5.3)
SODIUM: 140 meq/L (ref 135–145)
Total Bilirubin: 1 mg/dL (ref 0.2–1.2)
Total Protein: 6.2 g/dL (ref 6.0–8.3)

## 2014-01-18 LAB — IFE INTERPRETATION

## 2014-01-18 LAB — PROTEIN ELECTROPHORESIS, SERUM, WITH REFLEX
ALPHA-1-GLOBULIN: 4.3 % (ref 2.9–4.9)
Albumin ELP: 57.5 % (ref 55.8–66.1)
Alpha-2-Globulin: 10.3 % (ref 7.1–11.8)
BETA 2: 3.9 % (ref 3.2–6.5)
Beta Globulin: 6.9 % (ref 4.7–7.2)
Gamma Globulin: 17.1 % (ref 11.1–18.8)
M-Spike, %: 0.42 g/dL
TOTAL PROTEIN, SERUM ELECTROPHOR: 6.2 g/dL (ref 6.0–8.3)

## 2014-01-18 LAB — KAPPA/LAMBDA LIGHT CHAINS
KAPPA FREE LGHT CHN: 3.17 mg/dL — AB (ref 0.33–1.94)
Kappa:Lambda Ratio: 0.53 (ref 0.26–1.65)
Lambda Free Lght Chn: 6.01 mg/dL — ABNORMAL HIGH (ref 0.57–2.63)

## 2014-01-18 LAB — IGG, IGA, IGM
IgA: 216 mg/dL (ref 68–379)
IgG (Immunoglobin G), Serum: 1090 mg/dL (ref 650–1600)
IgM, Serum: 84 mg/dL (ref 41–251)

## 2014-01-18 NOTE — Telephone Encounter (Signed)
Left pt message with 7-10 appointment

## 2014-01-20 ENCOUNTER — Telehealth: Payer: Self-pay | Admitting: *Deleted

## 2014-01-20 NOTE — Telephone Encounter (Addendum)
Message copied by Lenn Sink on Thu Jan 20, 2014 10:39 AM ------      Message from: Burney Gauze R      Created: Thu Jan 20, 2014  6:58 AM       Call - myeloma still nice and low!!! pete ------Informed pt that myeloma is still low.

## 2014-01-26 ENCOUNTER — Other Ambulatory Visit: Payer: Self-pay | Admitting: *Deleted

## 2014-01-26 DIAGNOSIS — C9000 Multiple myeloma not having achieved remission: Secondary | ICD-10-CM

## 2014-01-26 MED ORDER — POMALIDOMIDE 2 MG PO CAPS: 2.0000 mg | ORAL_CAPSULE | Freq: Every day | ORAL | Status: DC

## 2014-02-18 ENCOUNTER — Ambulatory Visit (HOSPITAL_BASED_OUTPATIENT_CLINIC_OR_DEPARTMENT_OTHER): Payer: Medicare HMO | Admitting: Lab

## 2014-02-18 ENCOUNTER — Ambulatory Visit (HOSPITAL_BASED_OUTPATIENT_CLINIC_OR_DEPARTMENT_OTHER): Payer: Medicare HMO | Admitting: Hematology & Oncology

## 2014-02-18 ENCOUNTER — Encounter: Payer: Self-pay | Admitting: Hematology & Oncology

## 2014-02-18 ENCOUNTER — Ambulatory Visit (HOSPITAL_BASED_OUTPATIENT_CLINIC_OR_DEPARTMENT_OTHER): Payer: Medicare HMO

## 2014-02-18 VITALS — BP 134/67 | HR 57 | Temp 97.6°F | Resp 18 | Ht 68.0 in | Wt 156.0 lb

## 2014-02-18 DIAGNOSIS — C9002 Multiple myeloma in relapse: Secondary | ICD-10-CM

## 2014-02-18 DIAGNOSIS — C9000 Multiple myeloma not having achieved remission: Secondary | ICD-10-CM

## 2014-02-18 DIAGNOSIS — R972 Elevated prostate specific antigen [PSA]: Secondary | ICD-10-CM

## 2014-02-18 LAB — CBC WITH DIFFERENTIAL (CANCER CENTER ONLY)
BASO#: 0.1 10*3/uL (ref 0.0–0.2)
BASO%: 1.9 % (ref 0.0–2.0)
EOS%: 5.1 % (ref 0.0–7.0)
Eosinophils Absolute: 0.2 10*3/uL (ref 0.0–0.5)
HCT: 34.4 % — ABNORMAL LOW (ref 38.7–49.9)
HGB: 11.8 g/dL — ABNORMAL LOW (ref 13.0–17.1)
LYMPH#: 0.4 10*3/uL — ABNORMAL LOW (ref 0.9–3.3)
LYMPH%: 10.9 % — ABNORMAL LOW (ref 14.0–48.0)
MCH: 31.4 pg (ref 28.0–33.4)
MCHC: 34.3 g/dL (ref 32.0–35.9)
MCV: 92 fL (ref 82–98)
MONO#: 0.4 10*3/uL (ref 0.1–0.9)
MONO%: 11.2 % (ref 0.0–13.0)
NEUT%: 70.9 % (ref 40.0–80.0)
NEUTROS ABS: 2.7 10*3/uL (ref 1.5–6.5)
Platelets: 190 10*3/uL (ref 145–400)
RBC: 3.76 10*6/uL — ABNORMAL LOW (ref 4.20–5.70)
RDW: 14.9 % (ref 11.1–15.7)
WBC: 3.8 10*3/uL — ABNORMAL LOW (ref 4.0–10.0)

## 2014-02-18 LAB — CMP (CANCER CENTER ONLY)
ALT: 18 U/L (ref 10–47)
AST: 20 U/L (ref 11–38)
Albumin: 3.1 g/dL — ABNORMAL LOW (ref 3.3–5.5)
Alkaline Phosphatase: 87 U/L — ABNORMAL HIGH (ref 26–84)
BILIRUBIN TOTAL: 1.4 mg/dL (ref 0.20–1.60)
BUN, Bld: 22 mg/dL (ref 7–22)
CALCIUM: 8.1 mg/dL (ref 8.0–10.3)
CHLORIDE: 101 meq/L (ref 98–108)
CO2: 27 meq/L (ref 18–33)
Creat: 1.4 mg/dl — ABNORMAL HIGH (ref 0.6–1.2)
GLUCOSE: 141 mg/dL — AB (ref 73–118)
Potassium: 3.7 mEq/L (ref 3.3–4.7)
Sodium: 143 mEq/L (ref 128–145)
Total Protein: 6.6 g/dL (ref 6.4–8.1)

## 2014-02-18 MED ORDER — ZOLEDRONIC ACID 4 MG/5ML IV CONC
3.3000 mg | Freq: Once | INTRAVENOUS | Status: AC
  Administered 2014-02-18: 3.3 mg via INTRAVENOUS
  Filled 2014-02-18: qty 4.13

## 2014-02-18 MED ORDER — SODIUM CHLORIDE 0.9 % IV SOLN
Freq: Once | INTRAVENOUS | Status: AC
  Administered 2014-02-18: 10:00:00 via INTRAVENOUS

## 2014-02-18 NOTE — Patient Instructions (Signed)

## 2014-02-18 NOTE — Progress Notes (Signed)
Creatinine 1.4.

## 2014-02-22 ENCOUNTER — Other Ambulatory Visit: Payer: Self-pay | Admitting: *Deleted

## 2014-02-22 DIAGNOSIS — C9000 Multiple myeloma not having achieved remission: Secondary | ICD-10-CM

## 2014-02-22 LAB — IGG, IGA, IGM
IgA: 214 mg/dL (ref 68–379)
IgG (Immunoglobin G), Serum: 1040 mg/dL (ref 650–1600)
IgM, Serum: 67 mg/dL (ref 41–251)

## 2014-02-22 LAB — PROTEIN ELECTROPHORESIS, SERUM, WITH REFLEX
ALBUMIN ELP: 56.7 % (ref 55.8–66.1)
ALPHA-1-GLOBULIN: 4.3 % (ref 2.9–4.9)
ALPHA-2-GLOBULIN: 10 % (ref 7.1–11.8)
Beta 2: 4.8 % (ref 3.2–6.5)
Beta Globulin: 6.4 % (ref 4.7–7.2)
GAMMA GLOBULIN: 17.8 % (ref 11.1–18.8)
M-Spike, %: 0.38 g/dL
Total Protein, Serum Electrophoresis: 6.2 g/dL (ref 6.0–8.3)

## 2014-02-22 LAB — KAPPA/LAMBDA LIGHT CHAINS
Kappa free light chain: 2.35 mg/dL — ABNORMAL HIGH (ref 0.33–1.94)
Kappa:Lambda Ratio: 0.49 (ref 0.26–1.65)
Lambda Free Lght Chn: 4.82 mg/dL — ABNORMAL HIGH (ref 0.57–2.63)

## 2014-02-22 LAB — IFE INTERPRETATION

## 2014-02-22 LAB — LACTATE DEHYDROGENASE: LDH: 157 U/L (ref 94–250)

## 2014-02-22 MED ORDER — POMALIDOMIDE 2 MG PO CAPS: 2.0000 mg | ORAL_CAPSULE | Freq: Every day | ORAL | Status: DC

## 2014-02-23 ENCOUNTER — Telehealth: Payer: Self-pay | Admitting: *Deleted

## 2014-02-23 NOTE — Telephone Encounter (Addendum)
Message copied by Lenn Sink on Wed Feb 23, 2014  1:17 PM ------      Message from: Burney Gauze R      Created: Wed Feb 23, 2014  7:16 AM       Call - myeloma is better!!  pete ------Informed pt that myeloma is better.

## 2014-02-25 ENCOUNTER — Ambulatory Visit: Payer: Medicare HMO

## 2014-02-25 DIAGNOSIS — C9000 Multiple myeloma not having achieved remission: Secondary | ICD-10-CM

## 2014-02-28 ENCOUNTER — Encounter: Payer: Self-pay | Admitting: Hematology & Oncology

## 2014-02-28 NOTE — Progress Notes (Signed)
Hematology and Oncology Follow Up Visit  Christian Burns [id number] 03-01-1930 80 y.o. 02/28/2014   Principle Diagnosis:   Recurrent IgG lambda myeloma  Current Therapy:    Pomalidomide 2 mg by mouth daily (21/7)  Zometa 3.3 mg IV q. 4 weeks     Interim History:  Mr.  Burns is back for followup.   He is feeling well. He had a good July 4 weekend. He says back really is not bothering him. He is able to do pretty much what he wishes.  He's had no problems leg swelling. He is on diuretics. He actually had to go to the emergency room at one point because of dehydration from diuretics.  His last myeloma studies back in June showed a monoclonal spike of 0.42 g/dL. His I IgG level was 1090 mg/dL. Lambda light chain was 6.01 mg/dL.  His appetite has been good. He's had no problems with nausea or vomiting. She's had no bleeding. He's had no change in bowel or bladder habits.  His PSA is chronically elevated. He sees a urologist.  Medications: Current outpatient prescriptions:aspirin EC 81 MG tablet, Take 162 mg by mouth daily. , Disp: , Rfl: ;  Calcium Carb-Cholecalciferol (CALCIUM 600 + D PO), Take by mouth every morning., Disp: , Rfl: ;  Cyanocobalamin (VITAMIN B 12 PO), Take 1,000 mcg by mouth every morning. , Disp: , Rfl: ;  Multiple Vitamins-Minerals (MULTIVITAL PO), Take 1 tablet by mouth every morning. , Disp: , Rfl:  Pyridoxine HCl (VITAMIN B-6) 250 MG tablet, Take 500 mg by mouth daily., Disp: , Rfl: ;  Vitamin D, Ergocalciferol, (DRISDOL) 50000 UNITS CAPS capsule, Take 50,000 Units by mouth every 7 (seven) days. Sundays, Disp: , Rfl: ;  pomalidomide (POMALYST) 2 MG capsule, Take 1 capsule (2 mg total) by mouth daily. Take with water on days 1-21. Repeat every 28 days. Auth# [id number], Disp: 30 capsule, Rfl: 0  Allergies:  Allergies  Allergen Reactions  . Sulfa Antibiotics Itching    Past Medical History, Surgical history, Social history, and Family History were reviewed and  updated.  Review of Systems: As above  Physical Exam:  height is 5\' 8"  (1.727 m) and weight is 156 lb (70.761 kg). His oral temperature is 97.6 F (36.4 C). His blood pressure is 134/67 and his pulse is 57. His respiration is 18.   Thin, elderly gentleman. He is no distress. Head and neck exam shows no ocular or oral lesions. He has no palpable cervical or supraclavicular lymph nodes. Lungs are clear bilaterally. Cardiac exam is regular in rhythm with no murmurs rubs or bruits. Abdomen is soft. He has good bowel sounds. There is no fluid wave. There is no palpable liver or spleen. Back exam no tenderness over the spine ribs or hips. He has some kyphosis. Extremities shows some age related changes. He has good range of motion and strength. Skin exam shows no rashes. Neurological exam is nonfocal.  Lab Results  Component Value Date   WBC 3.8* 02/18/2014   HGB 11.8* 02/18/2014   HCT 34.4* 02/18/2014   MCV 92 02/18/2014   PLT 190 02/18/2014     Chemistry      Component Value Date/Time   NA 143 02/18/2014 0802   NA 140 01/14/2014 1530   K 3.7 02/18/2014 0802   K 4.3 01/14/2014 1530   CL 101 02/18/2014 0802   CL 105 01/14/2014 1530   CO2 27 02/18/2014 0802   CO2 26 01/14/2014 1530   BUN  22 02/18/2014 0802   BUN 26* 01/14/2014 1530   CREATININE 1.4* 02/18/2014 0802   CREATININE 1.48* 01/14/2014 1530      Component Value Date/Time   CALCIUM 8.1 02/18/2014 0802   CALCIUM 8.8 01/14/2014 1530   ALKPHOS 87* 02/18/2014 0802   ALKPHOS 106 01/14/2014 1530   AST 20 02/18/2014 0802   AST 17 01/14/2014 1530   ALT 18 02/18/2014 0802   ALT 14 01/14/2014 1530   BILITOT 1.40 02/18/2014 0802   BILITOT 1.0 01/14/2014 1530      M spike 0.38 g/dL. IgG level is 1040 mg/dL. Lambda light chains 4.82 mg/dL.   Impression and Plan: Christian Burns is 80 year old gentleman with IgG lambda myeloma. He's on Pomalidomide. He's doing fairly well from my point of view. We will continue to follow his myeloma studies. I am glad that he is is not  having symptoms with his back. He has his changes that are stable.  We will go ahead and give him Zometa today. We gave him a reduced dose secondary to his renal function.  I want to see him back in another 6 weeks.  We will go ahead and plan to get back to see [place] in another 6 weeks. I think that as long as his myeloma levels are stable, we do not need to make any changes with his program. [name], MD 7/20/20157:38 AM

## 2014-03-01 LAB — UIFE/LIGHT CHAINS/TP QN, 24-HR UR
ALBUMIN, U: DETECTED
Alpha 1, Urine: DETECTED — AB
Alpha 2, Urine: DETECTED — AB
BETA UR: DETECTED — AB
FREE LAMBDA EXCRETION/DAY: 18.2 mg/d
Free Kappa Lt Chains,Ur: 4.94 mg/dL — ABNORMAL HIGH (ref 0.14–2.42)
Free Kappa/Lambda Ratio: 3.8 ratio (ref 2.04–10.37)
Free Lambda Lt Chains,Ur: 1.3 mg/dL — ABNORMAL HIGH (ref 0.02–0.67)
Free Lt Chn Excr Rate: 69.16 mg/d
GAMMA UR: DETECTED — AB
TOTAL PROTEIN, URINE-UR/DAY: 234 mg/d — AB (ref 10–140)
Time: 24 hours
Total Protein, Urine: 16.7 mg/dL
Volume, Urine: 1400 mL

## 2014-03-02 ENCOUNTER — Telehealth: Payer: Self-pay | Admitting: *Deleted

## 2014-03-02 NOTE — Telephone Encounter (Addendum)
Message copied by Lenn Sink on Wed Mar 02, 2014  3:38 PM ------      Message from: [name]      Created: Tue Mar 01, 2014  2:51 PM       Call - urine has very little myeloma protein in it!!  [name] ------Informed pt that urine has very little myeloma protein in it.

## 2014-03-25 ENCOUNTER — Telehealth: Payer: Self-pay | Admitting: Hematology & Oncology

## 2014-03-25 ENCOUNTER — Ambulatory Visit (HOSPITAL_BASED_OUTPATIENT_CLINIC_OR_DEPARTMENT_OTHER): Payer: Medicare HMO | Admitting: Family

## 2014-03-25 ENCOUNTER — Encounter: Payer: Self-pay | Admitting: Family

## 2014-03-25 ENCOUNTER — Ambulatory Visit (HOSPITAL_BASED_OUTPATIENT_CLINIC_OR_DEPARTMENT_OTHER): Payer: Medicare HMO

## 2014-03-25 ENCOUNTER — Other Ambulatory Visit: Payer: Self-pay | Admitting: *Deleted

## 2014-03-25 ENCOUNTER — Other Ambulatory Visit: Payer: Self-pay

## 2014-03-25 ENCOUNTER — Ambulatory Visit (HOSPITAL_BASED_OUTPATIENT_CLINIC_OR_DEPARTMENT_OTHER): Payer: Medicare HMO | Admitting: Lab

## 2014-03-25 VITALS — BP 160/73 | HR 53 | Temp 97.5°F | Resp 18 | Ht 68.0 in | Wt 157.0 lb

## 2014-03-25 DIAGNOSIS — C9002 Multiple myeloma in relapse: Secondary | ICD-10-CM

## 2014-03-25 DIAGNOSIS — C9000 Multiple myeloma not having achieved remission: Secondary | ICD-10-CM

## 2014-03-25 LAB — CBC WITH DIFFERENTIAL (CANCER CENTER ONLY)
BASO#: 0.1 10*3/uL (ref 0.0–0.2)
BASO%: 1.7 % (ref 0.0–2.0)
EOS%: 7.4 % — AB (ref 0.0–7.0)
Eosinophils Absolute: 0.4 10*3/uL (ref 0.0–0.5)
HCT: 36.5 % — ABNORMAL LOW (ref 38.7–49.9)
HGB: 12.6 g/dL — ABNORMAL LOW (ref 13.0–17.1)
LYMPH#: 0.6 10*3/uL — ABNORMAL LOW (ref 0.9–3.3)
LYMPH%: 11.7 % — AB (ref 14.0–48.0)
MCH: 31.3 pg (ref 28.0–33.4)
MCHC: 34.5 g/dL (ref 32.0–35.9)
MCV: 91 fL (ref 82–98)
MONO#: 0.8 10*3/uL (ref 0.1–0.9)
MONO%: 17.6 % — AB (ref 0.0–13.0)
NEUT#: 2.9 10*3/uL (ref 1.5–6.5)
NEUT%: 61.6 % (ref 40.0–80.0)
Platelets: 129 10*3/uL — ABNORMAL LOW (ref 145–400)
RBC: 4.03 10*6/uL — ABNORMAL LOW (ref 4.20–5.70)
RDW: 14.4 % (ref 11.1–15.7)
WBC: 4.7 10*3/uL (ref 4.0–10.0)

## 2014-03-25 LAB — CMP (CANCER CENTER ONLY)
ALBUMIN: 3.3 g/dL (ref 3.3–5.5)
ALK PHOS: 102 U/L — AB (ref 26–84)
ALT: 18 U/L (ref 10–47)
AST: 25 U/L (ref 11–38)
BUN, Bld: 23 mg/dL — ABNORMAL HIGH (ref 7–22)
CO2: 28 mEq/L (ref 18–33)
Calcium: 8.7 mg/dL (ref 8.0–10.3)
Chloride: 103 mEq/L (ref 98–108)
Creat: 1.3 mg/dl — ABNORMAL HIGH (ref 0.6–1.2)
GLUCOSE: 114 mg/dL (ref 73–118)
POTASSIUM: 4.3 meq/L (ref 3.3–4.7)
SODIUM: 140 meq/L (ref 128–145)
TOTAL PROTEIN: 7 g/dL (ref 6.4–8.1)
Total Bilirubin: 1.3 mg/dl (ref 0.20–1.60)

## 2014-03-25 MED ORDER — ZOLEDRONIC ACID 4 MG/5ML IV CONC
3.3000 mg | Freq: Once | INTRAVENOUS | Status: AC
  Administered 2014-03-25: 3.3 mg via INTRAVENOUS
  Filled 2014-03-25: qty 4.13

## 2014-03-25 MED ORDER — POMALIDOMIDE 2 MG PO CAPS: 2.0000 mg | ORAL_CAPSULE | Freq: Every day | ORAL | Status: DC

## 2014-03-25 MED ORDER — SODIUM CHLORIDE 0.9 % IV SOLN
Freq: Once | INTRAVENOUS | Status: AC
  Administered 2014-03-25: 12:00:00 via INTRAVENOUS

## 2014-03-25 NOTE — Patient Instructions (Signed)

## 2014-03-25 NOTE — Telephone Encounter (Signed)
[facility] MCR - NPR  [id number] PR ZOLEDRONIC ACID 1MG  - ZOMETA J7050 PR NORMAL SALINE SOLUTION INFUS  Myeloma - Primary 203.00  P: [phone] & [phone] I spoke w Caey today 04/06/2014  Ref: [id number]

## 2014-03-25 NOTE — Progress Notes (Signed)
[place]  [phone] [phone]) [phone]  ID: Hazle Coca OB: 1930-04-28 MR#: [id number]#:[id number] Patient Care Team: Leanna Battles, MD as PCP - General (Internal Medicine)  DIAGNOSIS: Recurrent IgG lambda myeloma  INTERVAL HISTORY: [name] is here today for a follow-up. He is feeling well. 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 He likes getting out and going to the store. He's had no problems with leg swelling. He is on diuretics. In the past he has had to go to the emergency room because of dehydration from diuretics.  His last myeloma studies back in July showed a monoclonal spike of 0.38 g/dL. His I IgG level was 1040 mg/dL. Lambda light chain was 4.82 mg/dL. His PSA is chronically elevated. He sees a urologist for this. He has had no bleeding. His appetite is good. He denies fever, chills, n/v, cough, rash, headache, dizziness, SOB, chest pain, palpitations, abdominal pain, constipation, diarrhea, blood in urine or stool. He is still having some numbness in his feet that is tolerable. He denies swelling, tenderness or tingling in his extremities. Overall, he seems to be doing quite well.   CURRENT TREATMENT: Pomalidomide 2 mg by mouth daily (21/7)  Zometa 3.3 mg IV q. 4 weeks  REVIEW OF SYSTEMS: All other 10 point review of systems is negative.   PAST MEDICAL HISTORY: Past Medical History  Diagnosis Date  . Multiple myeloma Sept 2010  . History of radiation therapy 06/17/13-07/06/13    37 gray to upper lumbar spine   PAST SURGICAL HISTORY: Past Surgical History  Procedure Laterality Date  . Tonsillectomy     FAMILY HISTORY No family history on file.  GYNECOLOGIC HISTORY:  No LMP for male patient.   SOCIAL HISTORY:  History   Social History  . Marital Status: Married    Spouse Name: N/A    Number of Children: N/A  . Years of Education: N/A   Occupational History  . Not on file.   Social History Main Topics  .  Smoking status: Never Smoker   . Smokeless tobacco: Never Used     Comment: never used tobacco  . Alcohol Use: Yes     Comment: Wine Occassionally  . Drug Use: Not on file  . Sexual Activity: Not on file   Other Topics Concern  . Not on file   Social History Narrative  . No narrative on file   ADVANCED DIRECTIVES: <no information>  HEALTH MAINTENANCE: History  Substance Use Topics  . Smoking status: Never Smoker   . Smokeless tobacco: Never Used     Comment: never used tobacco  . Alcohol Use: Yes     Comment: Wine Occassionally   Colonoscopy: PAP: Bone density: Lipid panel:  Allergies  Allergen Reactions  . Sulfa Antibiotics Itching    Current Outpatient Prescriptions  Medication Sig Dispense Refill  . aspirin EC 81 MG tablet Take 162 mg by mouth daily.       . Calcium Carb-Cholecalciferol (CALCIUM 600 + D PO) Take by mouth every morning.      . Cyanocobalamin (VITAMIN B 12 PO) Take 1,000 mcg by mouth every morning.       . Multiple Vitamins-Minerals (MULTIVITAL PO) Take 1 tablet by mouth every morning.       . pomalidomide (POMALYST) 2 MG capsule Take 1 capsule (2 mg total) by mouth daily. Take with water on days 1-21. Repeat every 28 days. Auth# [id number]  30 capsule  0  . Pyridoxine HCl (  VITAMIN B-6) 250 MG tablet Take 500 mg by mouth daily.      . Vitamin D, Ergocalciferol, (DRISDOL) 50000 UNITS CAPS capsule Take 50,000 Units by mouth every 7 (seven) days. Sundays       No current facility-administered medications for this visit.   OBJECTIVE: Filed Vitals:   03/25/14 1150  BP: 160/73  Pulse: 53  Temp: 97.5 F (36.4 C)  Resp: 18   Body mass index is 23.88 kg/(m^2). ECOG FS:0 - Asymptomatic Ocular: Sclerae unicteric, pupils equal, round and reactive to light Ear-nose-throat: Oropharynx clear, dentition fair Lymphatic: No cervical or supraclavicular adenopathy Lungs no rales or rhonchi, good excursion bilaterally Heart regular rate and rhythm, no murmur  appreciated Abd soft, nontender, positive bowel sounds MSK no focal spinal tenderness, no joint edema Neuro: non-focal, well-oriented, appropriate affect  LAB RESULTS: CMP     Component Value Date/Time   NA 140 03/25/2014 1014   NA 140 01/14/2014 1530   K 4.3 03/25/2014 1014   K 4.3 01/14/2014 1530   CL 103 03/25/2014 1014   CL 105 01/14/2014 1530   CO2 28 03/25/2014 1014   CO2 26 01/14/2014 1530   GLUCOSE 114 03/25/2014 1014   GLUCOSE 82 01/14/2014 1530   BUN 23* 03/25/2014 1014   BUN 26* 01/14/2014 1530   CREATININE 1.3* 03/25/2014 1014   CREATININE 1.48* 01/14/2014 1530   CALCIUM 8.7 03/25/2014 1014   CALCIUM 8.8 01/14/2014 1530   PROT 7.0 03/25/2014 1014   PROT 6.2 01/14/2014 1530   ALBUMIN 3.7 01/14/2014 1530   AST 25 03/25/2014 1014   AST 17 01/14/2014 1530   ALT 18 03/25/2014 1014   ALT 14 01/14/2014 1530   ALKPHOS 102* 03/25/2014 1014   ALKPHOS 106 01/14/2014 1530   BILITOT 1.30 03/25/2014 1014   BILITOT 1.0 01/14/2014 1530   GFRNONAA 35* 11/29/2013 1216   GFRAA 41* 11/29/2013 1216   No results found for this basename: SPEP, UPEP,  kappa and lambda light chains   Lab Results  Component Value Date   WBC 4.7 03/25/2014   NEUTROABS 2.9 03/25/2014   HGB 12.6* 03/25/2014   HCT 36.5* 03/25/2014   MCV 91 03/25/2014   PLT 129* 03/25/2014   No results found for this basename: LABCA2   No components found with this basename: SWHQP591   No results found for this basename: INR,  in the last 168 hours  STUDIES: No results found.  ASSESSMENT/PLAN: Mr. Elman is 80 year old gentleman with IgG lambda myeloma. He's on Pomalidomide. He is doing well with this. We will continue to follow his myeloma studies. He is asymptomatic at this time.  We will go ahead and proceed with his Zometa today as planned. We give him a reduced dose secondary to his renal function.  We will see him back in 6 weeks for labs and follow-up.  He is in agreement with this and know to call here with any questions or concerns. We can  certainly see him back sooner if need be.   Eliezer Bottom, NP 03/25/2014 3:05 PM

## 2014-03-25 NOTE — Telephone Encounter (Signed)
Mailed sept schedule °

## 2014-03-29 ENCOUNTER — Telehealth: Payer: Self-pay | Admitting: *Deleted

## 2014-03-29 LAB — PROTEIN ELECTROPHORESIS, SERUM, WITH REFLEX
ALPHA-1-GLOBULIN: 4.1 % (ref 2.9–4.9)
Albumin ELP: 57.2 % (ref 55.8–66.1)
Alpha-2-Globulin: 9.6 % (ref 7.1–11.8)
Beta 2: 4.2 % (ref 3.2–6.5)
Beta Globulin: 6.6 % (ref 4.7–7.2)
Gamma Globulin: 18.3 % (ref 11.1–18.8)
M-Spike, %: 0.48 g/dL
TOTAL PROTEIN, SERUM ELECTROPHOR: 6.6 g/dL (ref 6.0–8.3)

## 2014-03-29 LAB — IGG, IGA, IGM
IgA: 254 mg/dL (ref 68–379)
IgG (Immunoglobin G), Serum: 1200 mg/dL (ref 650–1600)
IgM, Serum: 68 mg/dL (ref 41–251)

## 2014-03-29 LAB — BETA 2 MICROGLOBULIN, SERUM: Beta-2 Microglobulin: 3.52 mg/L — ABNORMAL HIGH (ref <=2.51)

## 2014-03-29 LAB — KAPPA/LAMBDA LIGHT CHAINS
KAPPA LAMBDA RATIO: 0.6 (ref 0.26–1.65)
Kappa free light chain: 4.12 mg/dL — ABNORMAL HIGH (ref 0.33–1.94)
LAMBDA FREE LGHT CHN: 6.83 mg/dL — AB (ref 0.57–2.63)

## 2014-03-29 LAB — IFE INTERPRETATION

## 2014-03-29 NOTE — Telephone Encounter (Signed)
Called patient wife and left message that patient myeloma levels still holding pretty stable.

## 2014-03-29 NOTE — Telephone Encounter (Signed)
Message copied by Rico Ala on Tue Mar 29, 2014 10:04 AM ------      Message from: Burney Gauze R      Created: Mon Mar 28, 2014 10:26 PM       Call - myeloma is still holding pretty stable.  pete ------

## 2014-04-21 ENCOUNTER — Other Ambulatory Visit: Payer: Self-pay | Admitting: *Deleted

## 2014-04-21 DIAGNOSIS — C9000 Multiple myeloma not having achieved remission: Secondary | ICD-10-CM

## 2014-04-21 MED ORDER — POMALIDOMIDE 2 MG PO CAPS: 2.0000 mg | ORAL_CAPSULE | Freq: Every day | ORAL | Status: DC

## 2014-05-06 ENCOUNTER — Ambulatory Visit (HOSPITAL_BASED_OUTPATIENT_CLINIC_OR_DEPARTMENT_OTHER): Payer: Medicare HMO | Admitting: Lab

## 2014-05-06 ENCOUNTER — Ambulatory Visit (HOSPITAL_BASED_OUTPATIENT_CLINIC_OR_DEPARTMENT_OTHER): Payer: Medicare HMO | Admitting: Hematology & Oncology

## 2014-05-06 ENCOUNTER — Ambulatory Visit (HOSPITAL_BASED_OUTPATIENT_CLINIC_OR_DEPARTMENT_OTHER): Payer: Medicare HMO

## 2014-05-06 ENCOUNTER — Encounter: Payer: Self-pay | Admitting: Hematology & Oncology

## 2014-05-06 VITALS — BP 172/66 | HR 76 | Temp 97.3°F | Resp 18 | Ht 68.0 in | Wt 157.0 lb

## 2014-05-06 DIAGNOSIS — C9000 Multiple myeloma not having achieved remission: Secondary | ICD-10-CM

## 2014-05-06 DIAGNOSIS — C9002 Multiple myeloma in relapse: Secondary | ICD-10-CM

## 2014-05-06 LAB — CBC WITH DIFFERENTIAL (CANCER CENTER ONLY)
BASO#: 0.1 10*3/uL (ref 0.0–0.2)
BASO%: 2.1 % — AB (ref 0.0–2.0)
EOS ABS: 0.3 10*3/uL (ref 0.0–0.5)
EOS%: 7.9 % — ABNORMAL HIGH (ref 0.0–7.0)
HCT: 34.7 % — ABNORMAL LOW (ref 38.7–49.9)
HGB: 12.1 g/dL — ABNORMAL LOW (ref 13.0–17.1)
LYMPH#: 0.5 10*3/uL — ABNORMAL LOW (ref 0.9–3.3)
LYMPH%: 13.2 % — AB (ref 14.0–48.0)
MCH: 31.3 pg (ref 28.0–33.4)
MCHC: 34.9 g/dL (ref 32.0–35.9)
MCV: 90 fL (ref 82–98)
MONO#: 0.6 10*3/uL (ref 0.1–0.9)
MONO%: 16.4 % — ABNORMAL HIGH (ref 0.0–13.0)
NEUT%: 60.4 % (ref 40.0–80.0)
NEUTROS ABS: 2.3 10*3/uL (ref 1.5–6.5)
Platelets: 172 10*3/uL (ref 145–400)
RBC: 3.86 10*6/uL — AB (ref 4.20–5.70)
RDW: 14.5 % (ref 11.1–15.7)
WBC: 3.8 10*3/uL — AB (ref 4.0–10.0)

## 2014-05-06 LAB — CMP (CANCER CENTER ONLY)
ALT: 20 U/L (ref 10–47)
AST: 22 U/L (ref 11–38)
Albumin: 3.1 g/dL — ABNORMAL LOW (ref 3.3–5.5)
Alkaline Phosphatase: 72 U/L (ref 26–84)
BILIRUBIN TOTAL: 1.2 mg/dL (ref 0.20–1.60)
BUN, Bld: 19 mg/dL (ref 7–22)
CHLORIDE: 102 meq/L (ref 98–108)
CO2: 27 mEq/L (ref 18–33)
Calcium: 8.9 mg/dL (ref 8.0–10.3)
Creat: 1.3 mg/dl — ABNORMAL HIGH (ref 0.6–1.2)
Glucose, Bld: 93 mg/dL (ref 73–118)
Potassium: 4 mEq/L (ref 3.3–4.7)
Sodium: 138 mEq/L (ref 128–145)
TOTAL PROTEIN: 6.3 g/dL — AB (ref 6.4–8.1)

## 2014-05-06 MED ORDER — ZOLEDRONIC ACID 4 MG/5ML IV CONC
3.0000 mg | Freq: Once | INTRAVENOUS | Status: AC
  Administered 2014-05-06: 3 mg via INTRAVENOUS
  Filled 2014-05-06: qty 3.75

## 2014-05-06 NOTE — Patient Instructions (Signed)

## 2014-05-09 NOTE — Progress Notes (Signed)
Hematology and Oncology Follow Up Visit  JOSHUS ROGAN [id number] 06-04-1930 80 y.o. 05/09/2014   Principle Diagnosis:   Recurrent IgG lambda myeloma  Current Therapy:    Pomalidomide 2 mg by mouth daily (21/7)  Zometa 3.3 mg IV q. 4 weeks     Interim History:  Mr.  Lenzen is back for followup.   He is feeling well. He had a good summer. He says back really is not bothering him. He is able to do pretty much what he wishes.  He's had no problems leg swelling. He is on diuretics. He actually had to go to the emergency room at one point because of dehydration from diuretics. Currently, this is not a problem.  His last myeloma studies back in August showed a monoclonal spike of 0. 48 g/dL. His I IgG level was 1200 mg/dL. Lambda light chain was 6.83 mg/dL.  His appetite has been good. He's had no problems with nausea or vomiting. She's had no bleeding. He's had no change in bowel or bladder habits.  His PSA is chronically elevated. He sees a urologist. He has had no urinary problems.  Medications: Current outpatient prescriptions:aspirin EC 81 MG tablet, Take 162 mg by mouth daily. , Disp: , Rfl: ;  Calcium Carb-Cholecalciferol (CALCIUM 600 + D PO), Take by mouth every morning., Disp: , Rfl: ;  Cyanocobalamin (VITAMIN B 12 PO), Take 1,000 mcg by mouth every morning. , Disp: , Rfl: ;  Multiple Vitamins-Minerals (MULTIVITAL PO), Take 1 tablet by mouth every morning. , Disp: , Rfl:  pomalidomide (POMALYST) 2 MG capsule, Take 1 capsule (2 mg total) by mouth daily. Take with water on days 1-21. Repeat every 28 days. Auth# [id number], Disp: 30 capsule, Rfl: 0;  Pyridoxine HCl (VITAMIN B-6) 250 MG tablet, Take 500 mg by mouth daily., Disp: , Rfl: ;  Vitamin D, Ergocalciferol, (DRISDOL) 50000 UNITS CAPS capsule, Take 50,000 Units by mouth every 7 (seven) days. Sundays, Disp: , Rfl:   Allergies:  Allergies  Allergen Reactions  . Sulfa Antibiotics Itching    Past Medical History, Surgical history,  Social history, and Family History were reviewed and updated.  Review of Systems: As above  Physical Exam:  height is 5\' 8"  (1.727 m) and weight is 157 lb (71.215 kg). His oral temperature is 97.3 F (36.3 C). His blood pressure is 172/66 and his pulse is 76. His respiration is 18.   Thin, elderly gentleman. He is no distress. Head and neck exam shows no ocular or oral lesions. He has no palpable cervical or supraclavicular lymph nodes. Lungs are clear bilaterally. Cardiac exam is regular in rhythm with no murmurs rubs or bruits. Abdomen is soft. He has good bowel sounds. There is no fluid wave. There is no palpable liver or spleen. Back exam no tenderness over the spine ribs or hips. He has some kyphosis. Extremities shows some age related changes. He has good range of motion and strength. Skin exam shows no rashes. Neurological exam is nonfocal.  Lab Results  Component Value Date   WBC 3.8* 05/06/2014   HGB 12.1* 05/06/2014   HCT 34.7* 05/06/2014   MCV 90 05/06/2014   PLT 172 05/06/2014     Chemistry      Component Value Date/Time   NA 138 05/06/2014 1154   NA 140 01/14/2014 1530   K 4.0 05/06/2014 1154   K 4.3 01/14/2014 1530   CL 102 05/06/2014 1154   CL 105 01/14/2014 1530   CO2 27  05/06/2014 1154   CO2 26 01/14/2014 1530   BUN 19 05/06/2014 1154   BUN 26* 01/14/2014 1530   CREATININE 1.3* 05/06/2014 1154   CREATININE 1.48* 01/14/2014 1530      Component Value Date/Time   CALCIUM 8.9 05/06/2014 1154   CALCIUM 8.8 01/14/2014 1530   ALKPHOS 72 05/06/2014 1154   ALKPHOS 106 01/14/2014 1530   AST 22 05/06/2014 1154   AST 17 01/14/2014 1530   ALT 20 05/06/2014 1154   ALT 14 01/14/2014 1530   BILITOT 1.20 05/06/2014 1154   BILITOT 1.0 01/14/2014 1530       Impression and Plan: Mr. Baer is 80 year old gentleman with IgG lambda myeloma. He's on Pomalidomide. He's doing fairly well from my point of view. We will continue to follow his myeloma studies. I am glad that he is is not having symptoms with his  back. He has his changes that are stable.  We will go ahead and give him Zometa today. We gave him a reduced dose secondary to his renal function.   We will go ahead and plan to get back to see [place] in another 6 weeks. I think that as long as his myeloma levels are stable, we do not need to make any changes with his program. [name], MD 9/28/20157:15 AM

## 2014-05-10 LAB — PROTEIN ELECTROPHORESIS, SERUM, WITH REFLEX
ALPHA-1-GLOBULIN: 4 % (ref 2.9–4.9)
Albumin ELP: 57 % (ref 55.8–66.1)
Alpha-2-Globulin: 9.6 % (ref 7.1–11.8)
BETA 2: 4.8 % (ref 3.2–6.5)
Beta Globulin: 5.9 % (ref 4.7–7.2)
GAMMA GLOBULIN: 18.7 % (ref 11.1–18.8)
M-Spike, %: 0.51 g/dL
Total Protein, Serum Electrophoresis: 6.4 g/dL (ref 6.0–8.3)

## 2014-05-10 LAB — KAPPA/LAMBDA LIGHT CHAINS
KAPPA FREE LGHT CHN: 2.62 mg/dL — AB (ref 0.33–1.94)
KAPPA LAMBDA RATIO: 0.41 (ref 0.26–1.65)
Lambda Free Lght Chn: 6.36 mg/dL — ABNORMAL HIGH (ref 0.57–2.63)

## 2014-05-10 LAB — IGG, IGA, IGM
IgA: 219 mg/dL (ref 68–379)
IgG (Immunoglobin G), Serum: 1120 mg/dL (ref 650–1600)
IgM, Serum: 63 mg/dL (ref 41–251)

## 2014-05-10 LAB — BETA 2 MICROGLOBULIN, SERUM: BETA 2 MICROGLOBULIN: 3.13 mg/L — AB (ref <=2.51)

## 2014-05-10 LAB — IFE INTERPRETATION

## 2014-05-17 ENCOUNTER — Other Ambulatory Visit: Payer: Self-pay | Admitting: Nurse Practitioner

## 2014-05-17 DIAGNOSIS — C9000 Multiple myeloma not having achieved remission: Secondary | ICD-10-CM

## 2014-05-17 MED ORDER — POMALIDOMIDE 2 MG PO CAPS: 2.0000 mg | ORAL_CAPSULE | Freq: Every day | ORAL | Status: DC

## 2014-05-18 ENCOUNTER — Ambulatory Visit: Payer: Medicare HMO

## 2014-05-18 DIAGNOSIS — C9000 Multiple myeloma not having achieved remission: Secondary | ICD-10-CM

## 2014-05-21 LAB — KAPPA/LAMBDA LIGHT CHAINS, FREE, WITH RATIO, 24HR. URINE
KAPPA LIGHT CHAIN, FREE U: 104 mg/L — AB (ref 1.35–24.19)
Kappa/Lambda, Free Ratio: 8.97 (ref 2.04–10.37)
Lambda Light Chain, Free U: 11.6 mg/L — ABNORMAL HIGH (ref 0.24–6.66)

## 2014-05-23 LAB — UIFE/LIGHT CHAINS/TP QN, 24-HR UR
Albumin, U: DETECTED
Alpha 1, Urine: DETECTED — AB
Alpha 2, Urine: DETECTED — AB
Beta, Urine: DETECTED — AB
Gamma Globulin, Urine: DETECTED — AB
Time: 24 h
Total Protein, Urine-Ur/day: 540 mg/d — ABNORMAL HIGH
Total Protein, Urine: 30 mg/dL — ABNORMAL HIGH (ref 5–25)
Volume, Urine: 1800 mL

## 2014-05-30 ENCOUNTER — Other Ambulatory Visit: Payer: Self-pay | Admitting: Dermatology

## 2014-06-15 ENCOUNTER — Other Ambulatory Visit: Payer: Self-pay | Admitting: *Deleted

## 2014-06-15 DIAGNOSIS — C9000 Multiple myeloma not having achieved remission: Secondary | ICD-10-CM

## 2014-06-15 MED ORDER — POMALIDOMIDE 2 MG PO CAPS: 2.0000 mg | ORAL_CAPSULE | Freq: Every day | ORAL | Status: DC

## 2014-06-16 ENCOUNTER — Other Ambulatory Visit: Payer: Self-pay | Admitting: *Deleted

## 2014-06-17 ENCOUNTER — Ambulatory Visit (HOSPITAL_BASED_OUTPATIENT_CLINIC_OR_DEPARTMENT_OTHER): Payer: Medicare HMO

## 2014-06-17 ENCOUNTER — Ambulatory Visit (HOSPITAL_BASED_OUTPATIENT_CLINIC_OR_DEPARTMENT_OTHER): Payer: Medicare HMO | Admitting: Hematology & Oncology

## 2014-06-17 ENCOUNTER — Encounter: Payer: Self-pay | Admitting: Hematology & Oncology

## 2014-06-17 ENCOUNTER — Ambulatory Visit (HOSPITAL_BASED_OUTPATIENT_CLINIC_OR_DEPARTMENT_OTHER): Payer: Medicare HMO | Admitting: Lab

## 2014-06-17 VITALS — BP 147/80 | HR 80 | Temp 97.6°F | Resp 18 | Ht 68.0 in | Wt 156.0 lb

## 2014-06-17 DIAGNOSIS — C9000 Multiple myeloma not having achieved remission: Secondary | ICD-10-CM

## 2014-06-17 LAB — CBC WITH DIFFERENTIAL (CANCER CENTER ONLY)
BASO#: 0.1 10*3/uL (ref 0.0–0.2)
BASO%: 1.8 % (ref 0.0–2.0)
EOS ABS: 0.5 10*3/uL (ref 0.0–0.5)
EOS%: 9.5 % — ABNORMAL HIGH (ref 0.0–7.0)
HEMATOCRIT: 35.6 % — AB (ref 38.7–49.9)
HGB: 12.5 g/dL — ABNORMAL LOW (ref 13.0–17.1)
LYMPH#: 0.6 10*3/uL — ABNORMAL LOW (ref 0.9–3.3)
LYMPH%: 11.7 % — AB (ref 14.0–48.0)
MCH: 31.4 pg (ref 28.0–33.4)
MCHC: 35.1 g/dL (ref 32.0–35.9)
MCV: 89 fL (ref 82–98)
MONO#: 0.8 10*3/uL (ref 0.1–0.9)
MONO%: 16.9 % — AB (ref 0.0–13.0)
NEUT#: 3 10*3/uL (ref 1.5–6.5)
NEUT%: 60.1 % (ref 40.0–80.0)
PLATELETS: 145 10*3/uL (ref 145–400)
RBC: 3.98 10*6/uL — AB (ref 4.20–5.70)
RDW: 14.9 % (ref 11.1–15.7)
WBC: 5 10*3/uL (ref 4.0–10.0)

## 2014-06-17 LAB — CMP (CANCER CENTER ONLY)
ALT(SGPT): 19 U/L (ref 10–47)
AST: 20 U/L (ref 11–38)
Albumin: 3.2 g/dL — ABNORMAL LOW (ref 3.3–5.5)
Alkaline Phosphatase: 99 U/L — ABNORMAL HIGH (ref 26–84)
BILIRUBIN TOTAL: 1.4 mg/dL (ref 0.20–1.60)
BUN: 21 mg/dL (ref 7–22)
CALCIUM: 9.2 mg/dL (ref 8.0–10.3)
CHLORIDE: 104 meq/L (ref 98–108)
CO2: 26 meq/L (ref 18–33)
CREATININE: 1.2 mg/dL (ref 0.6–1.2)
Glucose, Bld: 110 mg/dL (ref 73–118)
Potassium: 3.9 mEq/L (ref 3.3–4.7)
Sodium: 140 mEq/L (ref 128–145)
Total Protein: 6.8 g/dL (ref 6.4–8.1)

## 2014-06-17 MED ORDER — ZOLEDRONIC ACID 4 MG/5ML IV CONC
3.3000 mg | Freq: Once | INTRAVENOUS | Status: AC
  Administered 2014-06-17: 3.3 mg via INTRAVENOUS
  Filled 2014-06-17: qty 4.13

## 2014-06-17 MED ORDER — SODIUM CHLORIDE 0.9 % IV SOLN
Freq: Once | INTRAVENOUS | Status: AC
  Administered 2014-06-17: 10:00:00 via INTRAVENOUS

## 2014-06-17 NOTE — Patient Instructions (Signed)

## 2014-06-17 NOTE — Progress Notes (Signed)
Hematology and Oncology Follow Up Visit  Christian Burns [id number] 03-05-1930 80 y.o. 06/17/2014   Principle Diagnosis:   Recurrent IgG lambda myeloma  Current Therapy:    Pomalidomide 2 mg by mouth daily (21/7)  Zometa 3.3 mg IV q. 4 weeks     Interim History:  Mr.  Burns is back for followup.   He is feeling well. He had a good summer. He says back really is not bothering him. He is able to do pretty much what he wishes.  He's had no problems leg swelling. He is on diuretics. He actually had to go to the emergency room at one point because of dehydration from diuretics. Currently, this is not a problem.  His last myeloma studies back in August showed a monoclonal spike of 0. 48 g/dL. His I IgG level was 1200 mg/dL. Lambda light chain was 6.83 mg/dL.  His appetite has been good. He's had no problems with nausea or vomiting. She's had no bleeding. He's had no change in bowel or bladder habits.  His PSA is chronically elevated. He sees a urologist. He has had no urinary problems.  Medications: Current outpatient prescriptions: aspirin EC 81 MG tablet, Take 162 mg by mouth daily. , Disp: , Rfl: ;  Calcium Carb-Cholecalciferol (CALCIUM 600 + D PO), Take by mouth every morning., Disp: , Rfl: ;  Cyanocobalamin (VITAMIN B 12 PO), Take 1,000 mcg by mouth every morning. , Disp: , Rfl: ;  Multiple Vitamins-Minerals (MULTIVITAL PO), Take 1 tablet by mouth every morning. , Disp: , Rfl:  pomalidomide (POMALYST) 2 MG capsule, Take 1 capsule (2 mg total) by mouth daily. Take with water on days 1-21. Repeat every 28 days. Auth# [id number], Disp: 30 capsule, Rfl: 0;  Pyridoxine HCl (VITAMIN B-6) 250 MG tablet, Take 500 mg by mouth daily., Disp: , Rfl: ;  Vitamin D, Ergocalciferol, (DRISDOL) 50000 UNITS CAPS capsule, Take 50,000 Units by mouth every 7 (seven) days. Sundays, Disp: , Rfl:   Allergies:  Allergies  Allergen Reactions  . Sulfa Antibiotics Itching    Past Medical History, Surgical history,  Social history, and Family History were reviewed and updated.  Review of Systems: As above  Physical Exam:  height is 5\' 8"  (1.727 m) and weight is 156 lb (70.761 kg). His oral temperature is 97.6 F (36.4 C). His blood pressure is 147/80 and his pulse is 80. His respiration is 18.   Thin, elderly gentleman. He is no distress. Head and neck exam shows no ocular or oral lesions. He has no palpable cervical or supraclavicular lymph nodes. Lungs are clear bilaterally. Cardiac exam is regular in rhythm with no murmurs rubs or bruits. Abdomen is soft. He has good bowel sounds. There is no fluid wave. There is no palpable liver or spleen. Back exam no tenderness over the spine ribs or hips. He has some kyphosis. Extremities shows some age related changes. He has good range of motion and strength. Skin exam shows no rashes. Neurological exam is nonfocal.  Lab Results  Component Value Date   WBC 5.0 06/17/2014   HGB 12.5* 06/17/2014   HCT 35.6* 06/17/2014   MCV 89 06/17/2014   PLT 145 06/17/2014     Chemistry      Component Value Date/Time   NA 140 06/17/2014 0819   NA 140 01/14/2014 1530   K 3.9 06/17/2014 0819   K 4.3 01/14/2014 1530   CL 104 06/17/2014 0819   CL 105 01/14/2014 1530   CO2  26 06/17/2014 0819   CO2 26 01/14/2014 1530   BUN 21 06/17/2014 0819   BUN 26* 01/14/2014 1530   CREATININE 1.2 06/17/2014 0819   CREATININE 1.48* 01/14/2014 1530      Component Value Date/Time   CALCIUM 9.2 06/17/2014 0819   CALCIUM 8.8 01/14/2014 1530   ALKPHOS 99* 06/17/2014 0819   ALKPHOS 106 01/14/2014 1530   AST 20 06/17/2014 0819   AST 17 01/14/2014 1530   ALT 19 06/17/2014 0819   ALT 14 01/14/2014 1530   BILITOT 1.40 06/17/2014 0819   BILITOT 1.0 01/14/2014 1530       Impression and Plan: Christian Burns is 80 year old gentleman with IgG lambda myeloma. He's on Pomalidomide. I am a little worried about the urine. He has gradually increasing protein in his urine. In his serum, the M  spike is gradually going up.  We will go ahead and give him Zometa today.    We will go ahead and plan to get back to see [place] in another 6 weeks.we will recheck a 24-hour urine on him when we see him back.  I had a long talk with him. I told him about the myeloma and we do not know what the prognosis would be. I think that as long as he has stable disease with low protein levels, he really should do well.  We will plan to get him back in another 6 weeks.    [name], MD 11/6/20159:37 AM

## 2014-06-22 LAB — PROTEIN ELECTROPHORESIS, SERUM, WITH REFLEX
ALBUMIN ELP: 54.2 % — AB (ref 55.8–66.1)
Alpha-1-Globulin: 4.5 % (ref 2.9–4.9)
Alpha-2-Globulin: 10.6 % (ref 7.1–11.8)
BETA GLOBULIN: 6 % (ref 4.7–7.2)
Beta 2: 5 % (ref 3.2–6.5)
GAMMA GLOBULIN: 19.7 % — AB (ref 11.1–18.8)
M-Spike, %: 0.58 g/dL
Total Protein, Serum Electrophoresis: 6.5 g/dL (ref 6.0–8.3)

## 2014-06-22 LAB — KAPPA/LAMBDA LIGHT CHAINS
Kappa free light chain: 3.27 mg/dL — ABNORMAL HIGH (ref 0.33–1.94)
Kappa:Lambda Ratio: 0.45 (ref 0.26–1.65)
LAMBDA FREE LGHT CHN: 7.3 mg/dL — AB (ref 0.57–2.63)

## 2014-06-22 LAB — LACTATE DEHYDROGENASE: LDH: 134 U/L (ref 94–250)

## 2014-06-22 LAB — IGG, IGA, IGM
IgA: 246 mg/dL (ref 68–379)
IgG (Immunoglobin G), Serum: 1420 mg/dL (ref 650–1600)
IgM, Serum: 66 mg/dL (ref 41–251)

## 2014-06-22 LAB — IFE INTERPRETATION

## 2014-07-13 LAB — KAPPA/LAMBDA LIGHT CHAINS, FREE, WITH RATIO, 24HR. URINE
KAPPA LIGHT CHAIN, FREE U: 121 mg/L — AB (ref 1.35–24.19)
KAPPA/LAMBDA, FREE RATIO: 6.54 (ref 2.04–10.37)
Lambda Light Chain, Free U: 18.5 mg/L — ABNORMAL HIGH (ref 0.24–6.66)

## 2014-07-13 LAB — UIFE/LIGHT CHAINS/TP QN, 24-HR UR
Albumin, U: DETECTED
Alpha 1, Urine: DETECTED — AB
Alpha 2, Urine: DETECTED — AB
Beta, Urine: DETECTED — AB
Gamma Globulin, Urine: DETECTED — AB
TOTAL PROTEIN, URINE-UPE24: 43 mg/dL — AB (ref 5–25)
TOTAL PROTEIN, URINE-UR/DAY: 473 mg/d — AB (ref <150)
Time: 24 hours
VOLUME, URINE-UPE24: 1100 mL

## 2014-07-14 ENCOUNTER — Other Ambulatory Visit: Payer: Self-pay | Admitting: *Deleted

## 2014-07-14 DIAGNOSIS — C9000 Multiple myeloma not having achieved remission: Secondary | ICD-10-CM

## 2014-07-14 MED ORDER — POMALIDOMIDE 2 MG PO CAPS: 2.0000 mg | ORAL_CAPSULE | Freq: Every day | ORAL | Status: DC

## 2014-07-15 ENCOUNTER — Ambulatory Visit (HOSPITAL_BASED_OUTPATIENT_CLINIC_OR_DEPARTMENT_OTHER): Payer: Medicare HMO | Admitting: Lab

## 2014-07-15 DIAGNOSIS — C9000 Multiple myeloma not having achieved remission: Secondary | ICD-10-CM

## 2014-07-15 LAB — CBC WITH DIFFERENTIAL (CANCER CENTER ONLY)
BASO#: 0.1 10*3/uL (ref 0.0–0.2)
BASO%: 2 % (ref 0.0–2.0)
EOS%: 8.5 % — ABNORMAL HIGH (ref 0.0–7.0)
Eosinophils Absolute: 0.4 10*3/uL (ref 0.0–0.5)
HCT: 36.1 % — ABNORMAL LOW (ref 38.7–49.9)
HGB: 12.4 g/dL — ABNORMAL LOW (ref 13.0–17.1)
LYMPH#: 0.6 10*3/uL — AB (ref 0.9–3.3)
LYMPH%: 13.9 % — ABNORMAL LOW (ref 14.0–48.0)
MCH: 31.3 pg (ref 28.0–33.4)
MCHC: 34.3 g/dL (ref 32.0–35.9)
MCV: 91 fL (ref 82–98)
MONO#: 0.8 10*3/uL (ref 0.1–0.9)
MONO%: 20.5 % — ABNORMAL HIGH (ref 0.0–13.0)
NEUT#: 2.3 10*3/uL (ref 1.5–6.5)
NEUT%: 55.1 % (ref 40.0–80.0)
PLATELETS: 149 10*3/uL (ref 145–400)
RBC: 3.96 10*6/uL — AB (ref 4.20–5.70)
RDW: 14.6 % (ref 11.1–15.7)
WBC: 4.1 10*3/uL (ref 4.0–10.0)

## 2014-07-15 LAB — COMPREHENSIVE METABOLIC PANEL
ALBUMIN: 3.7 g/dL (ref 3.5–5.2)
ALT: 15 U/L (ref 0–53)
AST: 18 U/L (ref 0–37)
Alkaline Phosphatase: 101 U/L (ref 39–117)
BUN: 18 mg/dL (ref 6–23)
CALCIUM: 8.7 mg/dL (ref 8.4–10.5)
CHLORIDE: 106 meq/L (ref 96–112)
CO2: 27 mEq/L (ref 19–32)
Creatinine, Ser: 1.41 mg/dL — ABNORMAL HIGH (ref 0.50–1.35)
GLUCOSE: 82 mg/dL (ref 70–99)
Potassium: 4.6 mEq/L (ref 3.5–5.3)
SODIUM: 140 meq/L (ref 135–145)
Total Bilirubin: 1.3 mg/dL — ABNORMAL HIGH (ref 0.2–1.2)
Total Protein: 6.6 g/dL (ref 6.0–8.3)

## 2014-07-19 LAB — KAPPA/LAMBDA LIGHT CHAINS
KAPPA FREE LGHT CHN: 3.54 mg/dL — AB (ref 0.33–1.94)
KAPPA LAMBDA RATIO: 0.3 (ref 0.26–1.65)
LAMBDA FREE LGHT CHN: 11.7 mg/dL — AB (ref 0.57–2.63)

## 2014-07-19 LAB — IGG, IGA, IGM
IGA: 247 mg/dL (ref 68–379)
IGM, SERUM: 77 mg/dL (ref 41–251)
IgG (Immunoglobin G), Serum: 1520 mg/dL (ref 650–1600)

## 2014-07-19 LAB — PROTEIN ELECTROPHORESIS, SERUM, WITH REFLEX
ALBUMIN ELP: 54.6 % — AB (ref 55.8–66.1)
ALPHA-2-GLOBULIN: 10.1 % (ref 7.1–11.8)
Alpha-1-Globulin: 3.8 % (ref 2.9–4.9)
Beta 2: 4.2 % (ref 3.2–6.5)
Beta Globulin: 6.2 % (ref 4.7–7.2)
Gamma Globulin: 21.1 % — ABNORMAL HIGH (ref 11.1–18.8)
M-Spike, %: 0.77 g/dL
Total Protein, Serum Electrophoresis: 7.1 g/dL (ref 6.0–8.3)

## 2014-07-19 LAB — IFE INTERPRETATION

## 2014-07-28 ENCOUNTER — Telehealth: Payer: Self-pay | Admitting: Nurse Practitioner

## 2014-07-28 ENCOUNTER — Ambulatory Visit (HOSPITAL_BASED_OUTPATIENT_CLINIC_OR_DEPARTMENT_OTHER): Payer: Medicare HMO

## 2014-07-28 ENCOUNTER — Ambulatory Visit (HOSPITAL_BASED_OUTPATIENT_CLINIC_OR_DEPARTMENT_OTHER): Payer: Medicare HMO | Admitting: Hematology & Oncology

## 2014-07-28 ENCOUNTER — Ambulatory Visit (HOSPITAL_BASED_OUTPATIENT_CLINIC_OR_DEPARTMENT_OTHER): Payer: Medicare HMO | Admitting: Lab

## 2014-07-28 DIAGNOSIS — C9000 Multiple myeloma not having achieved remission: Secondary | ICD-10-CM

## 2014-07-28 LAB — CBC WITH DIFFERENTIAL (CANCER CENTER ONLY)
BASO#: 0.1 10*3/uL (ref 0.0–0.2)
BASO%: 2.3 % — AB (ref 0.0–2.0)
EOS%: 3.3 % (ref 0.0–7.0)
Eosinophils Absolute: 0.1 10*3/uL (ref 0.0–0.5)
HCT: 35.6 % — ABNORMAL LOW (ref 38.7–49.9)
HGB: 12.3 g/dL — ABNORMAL LOW (ref 13.0–17.1)
LYMPH#: 0.8 10*3/uL — AB (ref 0.9–3.3)
LYMPH%: 18.8 % (ref 14.0–48.0)
MCH: 31.2 pg (ref 28.0–33.4)
MCHC: 34.6 g/dL (ref 32.0–35.9)
MCV: 90 fL (ref 82–98)
MONO#: 0.7 10*3/uL (ref 0.1–0.9)
MONO%: 16.8 % — ABNORMAL HIGH (ref 0.0–13.0)
NEUT#: 2.4 10*3/uL (ref 1.5–6.5)
NEUT%: 58.8 % (ref 40.0–80.0)
PLATELETS: 168 10*3/uL (ref 145–400)
RBC: 3.94 10*6/uL — ABNORMAL LOW (ref 4.20–5.70)
RDW: 14.1 % (ref 11.1–15.7)
WBC: 4 10*3/uL (ref 4.0–10.0)

## 2014-07-28 LAB — CMP (CANCER CENTER ONLY)
ALK PHOS: 84 U/L (ref 26–84)
ALT(SGPT): 16 U/L (ref 10–47)
AST: 24 U/L (ref 11–38)
Albumin: 3.3 g/dL (ref 3.3–5.5)
BUN: 19 mg/dL (ref 7–22)
CALCIUM: 9 mg/dL (ref 8.0–10.3)
CHLORIDE: 105 meq/L (ref 98–108)
CO2: 27 mEq/L (ref 18–33)
Creat: 1.2 mg/dl (ref 0.6–1.2)
GLUCOSE: 101 mg/dL (ref 73–118)
Potassium: 4 mEq/L (ref 3.3–4.7)
Sodium: 143 mEq/L (ref 128–145)
Total Bilirubin: 1.2 mg/dl (ref 0.20–1.60)
Total Protein: 7.3 g/dL (ref 6.4–8.1)

## 2014-07-28 MED ORDER — SODIUM CHLORIDE 0.9 % IV SOLN
3.3000 mg | Freq: Once | INTRAVENOUS | Status: AC
Start: 2014-07-28 — End: 2014-07-28
  Administered 2014-07-28: 3.3 mg via INTRAVENOUS
  Filled 2014-07-28: qty 4.13

## 2014-07-28 NOTE — Patient Instructions (Signed)

## 2014-07-28 NOTE — Progress Notes (Signed)
Hematology and Oncology Follow Up Visit  RAINN BULLINGER [id number] [birth year] 80 y.o. 07/28/2014   Principle Diagnosis:   Recurrent IgG lambda myeloma - progressive  Current Therapy:    Pomalidomide 2 mg by mouth daily (21/7) with the addition of Ixazomib 4mg  po q week (3/1)  Zometa 3.3 mg IV q. 4 weeks     Interim History:  Mr.  Matsuo is back for followup.   He is feeling well. Unfortunately, his labs I think are highly suggestive of his myeloma worsening. His M spike has gradually increased. Last M spike that we did in early December was up to 0.77 g/L. 6 months ago it was 0.38 g/L. He did do a 24-hour urine for [place]. This was done in late November. His total protein has stabilized at 473 mg per day. His lambda light chain was 18.5 mg/dL which is elevated from his last value of 11.6 mg/dL.   His serum lambda light chain was also going up. It was up to 11.7 mg/dL in December. Previously it was 7.3.  I suspect that all this is suggestive of his myeloma worsening. As such, I think we have to add a proteazome inhibitor to the Pomalidomide. The FDA disapproved and oral agent- Ixazomib - which is taken once a week for 3 weeks on and 1 week off. I think this would be a good recommendation for him. His wife is not doing too well and it be nice to have all his treatments at home and he only has to come see [place] once a month for his Zometa.  His performance status is ECOG space 1-2. He's not complaining of any lower back pain. He has some neuropathy in his feet.  His appetite is good. His weight holding steady.  He's had no fever. He's had no cough. He's had no infections. Medications: Current outpatient prescriptions: aspirin EC 81 MG tablet, Take 162 mg by mouth daily. , Disp: , Rfl: ;  Calcium Carb-Cholecalciferol (CALCIUM 600 + D PO), Take by mouth every morning., Disp: , Rfl: ;  Cyanocobalamin (VITAMIN B 12 PO), Take 1,000 mcg by mouth every morning. , Disp: , Rfl: ;  Multiple Vitamins-Minerals  (MULTIVITAL PO), Take 1 tablet by mouth every morning. , Disp: , Rfl:  pomalidomide (POMALYST) 2 MG capsule, Take 1 capsule (2 mg total) by mouth daily. Take with water on days 1-21. Repeat every 28 days. Auth# [id number], Disp: 21 capsule, Rfl: 0;  Pyridoxine HCl (VITAMIN B-6) 250 MG tablet, Take 500 mg by mouth daily., Disp: , Rfl: ;  Vitamin D, Ergocalciferol, (DRISDOL) 50000 UNITS CAPS capsule, Take 50,000 Units by mouth every 7 (seven) days. Sundays, Disp: , Rfl:   Allergies:  Allergies  Allergen Reactions  . Sulfa Antibiotics Itching    Past Medical History, Surgical history, Social history, and Family History were reviewed and updated.  Review of Systems: As above  Physical Exam:  vitals were not taken for this visit.  Thin, elderly gentleman. He is no distress. Head and neck exam shows no ocular or oral lesions. He has no palpable cervical or supraclavicular lymph nodes. Lungs are clear bilaterally. Cardiac exam is regular rate and rhythm with no murmurs rubs or bruits. Abdomen is soft. He has good bowel sounds. There is no fluid wave. There is no palpable liver or spleen. Back exam no tenderness over the spine ribs or hips. He has some kyphosis. Extremities shows some age related changes. He has good range of motion and strength. Skin  exam shows no rashes. Neurological exam is nonfocal.  Lab Results  Component Value Date   WBC 4.0 07/28/2014   HGB 12.3* 07/28/2014   HCT 35.6* 07/28/2014   MCV 90 07/28/2014   PLT 168 07/28/2014     Chemistry      Component Value Date/Time   NA 143 07/28/2014 1205   NA 140 07/15/2014 1345   K 4.0 07/28/2014 1205   K 4.6 07/15/2014 1345   CL 105 07/28/2014 1205   CL 106 07/15/2014 1345   CO2 27 07/28/2014 1205   CO2 27 07/15/2014 1345   BUN 19 07/28/2014 1205   BUN 18 07/15/2014 1345   CREATININE 1.2 07/28/2014 1205   CREATININE 1.41* 07/15/2014 1345      Component Value Date/Time   CALCIUM 9.0 07/28/2014 1205   CALCIUM 8.7 07/15/2014  1345   ALKPHOS 84 07/28/2014 1205   ALKPHOS 101 07/15/2014 1345   AST 24 07/28/2014 1205   AST 18 07/15/2014 1345   ALT 16 07/28/2014 1205   ALT 15 07/15/2014 1345   BILITOT 1.20 07/28/2014 1205   BILITOT 1.3* 07/15/2014 1345       Impression and Plan: Mr. Dunn is 80 year old gentleman with IgG lambda myeloma. He's on Pomalidomide. I am a little worried about the urine. He has gradually increasing protein in his urine. In his serum, the M spike is gradually going up.  Again, I spent about 40 minutes with him. I talked about adding the new oral agent. I talked him about this. He is agreeable to this. I will try to get this arranged for him.  We will go ahead and give him Zometa today.    I will plan to see him back in another 4 weeks. Braff I probably would not check a urine on him for another 2 or 3 months.   I had a long talk with him. I told him about the myeloma and we do not know what the prognosis would be. I think that with the change in program, he should still do very well.      [name], MD 12/17/20155:12 PM

## 2014-07-28 NOTE — Telephone Encounter (Signed)
Script and supporting documents were sent to [employer] in reference to starting him on Ninlaro 4mg . Confirmation received.

## 2014-07-29 ENCOUNTER — Other Ambulatory Visit: Payer: Medicare HMO | Admitting: Lab

## 2014-07-29 ENCOUNTER — Encounter: Payer: Self-pay | Admitting: *Deleted

## 2014-07-29 ENCOUNTER — Other Ambulatory Visit: Payer: Self-pay | Admitting: *Deleted

## 2014-07-29 ENCOUNTER — Ambulatory Visit: Payer: Medicare HMO

## 2014-07-29 ENCOUNTER — Ambulatory Visit: Payer: Medicare HMO | Admitting: Hematology & Oncology

## 2014-08-02 LAB — PROTEIN ELECTROPHORESIS, SERUM, WITH REFLEX
Albumin ELP: 54.3 % — ABNORMAL LOW (ref 55.8–66.1)
Alpha-1-Globulin: 4.2 % (ref 2.9–4.9)
Alpha-2-Globulin: 9.7 % (ref 7.1–11.8)
BETA 2: 4.8 % (ref 3.2–6.5)
BETA GLOBULIN: 5.5 % (ref 4.7–7.2)
Gamma Globulin: 21.5 % — ABNORMAL HIGH (ref 11.1–18.8)
M-SPIKE, %: 0.78 g/dL
TOTAL PROTEIN, SERUM ELECTROPHOR: 7.2 g/dL (ref 6.0–8.3)

## 2014-08-02 LAB — IGG, IGA, IGM
IGG (IMMUNOGLOBIN G), SERUM: 1450 mg/dL (ref 650–1600)
IGM, SERUM: 75 mg/dL (ref 41–251)
IgA: 246 mg/dL (ref 68–379)

## 2014-08-02 LAB — KAPPA/LAMBDA LIGHT CHAINS
Kappa free light chain: 1.82 mg/dL (ref 0.33–1.94)
Kappa:Lambda Ratio: 0.12 — ABNORMAL LOW (ref 0.26–1.65)
Lambda Free Lght Chn: 15 mg/dL — ABNORMAL HIGH (ref 0.57–2.63)

## 2014-08-02 LAB — IFE INTERPRETATION

## 2014-08-04 ENCOUNTER — Telehealth: Payer: Self-pay | Admitting: Hematology & Oncology

## 2014-08-04 NOTE — Telephone Encounter (Signed)
[employer] has APPROVED the [facility] CAP  and is good until 08/02/2015.    Case ID: [id number]     COPY SCANNED

## 2014-08-19 ENCOUNTER — Other Ambulatory Visit: Payer: Self-pay | Admitting: Nurse Practitioner

## 2014-08-19 DIAGNOSIS — C9000 Multiple myeloma not having achieved remission: Secondary | ICD-10-CM

## 2014-08-19 MED ORDER — POMALIDOMIDE 2 MG PO CAPS: 2.0000 mg | ORAL_CAPSULE | Freq: Every day | ORAL | Status: DC

## 2014-09-06 ENCOUNTER — Encounter: Payer: Self-pay | Admitting: *Deleted

## 2014-09-06 NOTE — Progress Notes (Signed)
Received notice from [employer] that patient had been enrolled in a copay assistance and his monthly copay will be $0

## 2014-09-07 ENCOUNTER — Telehealth: Payer: Self-pay | Admitting: Hematology & Oncology

## 2014-09-07 NOTE — Telephone Encounter (Signed)
Pt has been approved for reimbursement for cost sharing associated with specific, documented out-of-pocket medication expenses related to MM.  Valid: 09/07/2014 - 09/07/2015 Grant: $12,500.00 PRN: [id number]      COPY SCANNED

## 2014-09-09 ENCOUNTER — Ambulatory Visit (HOSPITAL_BASED_OUTPATIENT_CLINIC_OR_DEPARTMENT_OTHER): Payer: Medicare HMO | Admitting: Lab

## 2014-09-09 ENCOUNTER — Ambulatory Visit (HOSPITAL_BASED_OUTPATIENT_CLINIC_OR_DEPARTMENT_OTHER): Payer: Medicare HMO

## 2014-09-09 ENCOUNTER — Ambulatory Visit (HOSPITAL_BASED_OUTPATIENT_CLINIC_OR_DEPARTMENT_OTHER): Payer: Medicare HMO | Admitting: Hematology & Oncology

## 2014-09-09 VITALS — BP 159/80 | HR 66 | Temp 97.6°F | Resp 18 | Ht 68.0 in | Wt 154.0 lb

## 2014-09-09 DIAGNOSIS — C9000 Multiple myeloma not having achieved remission: Secondary | ICD-10-CM

## 2014-09-09 LAB — CMP (CANCER CENTER ONLY)
ALBUMIN: 3.1 g/dL — AB (ref 3.3–5.5)
ALK PHOS: 91 U/L — AB (ref 26–84)
ALT: 19 U/L (ref 10–47)
AST: 20 U/L (ref 11–38)
BUN: 15 mg/dL (ref 7–22)
CHLORIDE: 103 meq/L (ref 98–108)
CO2: 28 mEq/L (ref 18–33)
CREATININE: 1.2 mg/dL (ref 0.6–1.2)
Calcium: 8.7 mg/dL (ref 8.0–10.3)
Glucose, Bld: 85 mg/dL (ref 73–118)
Potassium: 4.2 mEq/L (ref 3.3–4.7)
SODIUM: 144 meq/L (ref 128–145)
Total Bilirubin: 0.9 mg/dl (ref 0.20–1.60)
Total Protein: 7.6 g/dL (ref 6.4–8.1)

## 2014-09-09 LAB — CBC WITH DIFFERENTIAL (CANCER CENTER ONLY)
BASO#: 0.1 10*3/uL (ref 0.0–0.2)
BASO%: 1.1 % (ref 0.0–2.0)
EOS%: 4.4 % (ref 0.0–7.0)
Eosinophils Absolute: 0.2 10*3/uL (ref 0.0–0.5)
HCT: 35.8 % — ABNORMAL LOW (ref 38.7–49.9)
HGB: 12.3 g/dL — ABNORMAL LOW (ref 13.0–17.1)
LYMPH#: 0.6 10*3/uL — ABNORMAL LOW (ref 0.9–3.3)
LYMPH%: 13.1 % — ABNORMAL LOW (ref 14.0–48.0)
MCH: 31.2 pg (ref 28.0–33.4)
MCHC: 34.4 g/dL (ref 32.0–35.9)
MCV: 91 fL (ref 82–98)
MONO#: 0.9 10*3/uL (ref 0.1–0.9)
MONO%: 18.2 % — ABNORMAL HIGH (ref 0.0–13.0)
NEUT%: 63.2 % (ref 40.0–80.0)
NEUTROS ABS: 3 10*3/uL (ref 1.5–6.5)
Platelets: 206 10*3/uL (ref 145–400)
RBC: 3.94 10*6/uL — ABNORMAL LOW (ref 4.20–5.70)
RDW: 13.9 % (ref 11.1–15.7)
WBC: 4.7 10*3/uL (ref 4.0–10.0)

## 2014-09-09 MED ORDER — SODIUM CHLORIDE 0.9 % IV SOLN
3.3000 mg | Freq: Once | INTRAVENOUS | Status: DC
  Administered 2014-09-09: 3.3 mg via INTRAVENOUS
  Filled 2014-09-09: qty 4.13

## 2014-09-09 NOTE — Patient Instructions (Signed)

## 2014-09-09 NOTE — Progress Notes (Signed)
Hematology and Oncology Follow Up Visit  Christian Burns [id number] Apr 10, 1930 82 y.o. 09/09/2014   Principle Diagnosis:   Recurrent IgG lambda myeloma - progressive  Current Therapy:    Pomalidomide 2 mg by mouth daily (21/7) with the addition of Ixazomib 4mg  po q week (3/1)  Zometa 3.3 mg IV q. 4 weeks     Interim History:  Mr.  Burns is back for followup.   He is feeling well. He is doing well with the Ninlaro. So far, he's had no problems with it. He has completed 1 cycle. He will start the next cycle along with the Pomalidomide in 2 weeks.  He's had no nausea or vomiting. He's had no change in bowel or bladder habits.  He may feel a little bit more fatigue.  He had a "cold" last week. Had a little bit of a cough. This is nonproductive.  His performance status is ECOG 1-2. He's not complaining of any lower back pain. He has some neuropathy in his feet. This is about the same.  His appetite is good. His weight holding steady.   Medications:  Current outpatient prescriptions:  .  aspirin EC 81 MG tablet, Take 162 mg by mouth daily. , Disp: , Rfl:  .  Calcium Carb-Cholecalciferol (CALCIUM 600 + D PO), Take by mouth every morning., Disp: , Rfl:  .  Cyanocobalamin (VITAMIN B 12 PO), Take 1,000 mcg by mouth every morning. , Disp: , Rfl:  .  Multiple Vitamins-Minerals (MULTIVITAL PO), Take 1 tablet by mouth every morning. , Disp: , Rfl:  .  NON FORMULARY, Take 4 mg by mouth. Take one tablet on day #1, #8 and #15 every 28 days., Disp: , Rfl:  .  pomalidomide (POMALYST) 2 MG capsule, Take 1 capsule (2 mg total) by mouth daily. Take with water on days 1-21. Repeat every 28 days. Auth# [id number], Disp: 21 capsule, Rfl: 0 .  Pyridoxine HCl (VITAMIN B-6) 250 MG tablet, Take 500 mg by mouth daily., Disp: , Rfl:  .  Vitamin D, Ergocalciferol, (DRISDOL) 50000 UNITS CAPS capsule, Take 50,000 Units by mouth every 7 (seven) days. Sundays, Disp: , Rfl:   Current facility-administered medications:   .  zolendronic acid (ZOMETA) 3.3 mg in sodium chloride 0.9 % 100 mL IVPB, 3.3 mg, Intravenous, Once, Helayne Seminole, PA-C  Allergies:  Allergies  Allergen Reactions  . Sulfa Antibiotics Itching    Past Medical History, Surgical history, Social history, and Family History were reviewed and updated.  Review of Systems: As above  Physical Exam:  height is 5\' 8"  (1.727 m) and weight is 154 lb (69.854 kg). His oral temperature is 97.6 F (36.4 C). His blood pressure is 159/80 and his pulse is 66. His respiration is 18.   Thin, elderly gentleman. He is no distress. Head and neck exam shows no ocular or oral lesions. He has no palpable cervical or supraclavicular lymph nodes. Lungs are clear bilaterally. Cardiac exam is regular rate and rhythm with no murmurs rubs or bruits. Abdomen is soft. He has good bowel sounds. There is no fluid wave. There is no palpable liver or spleen. Back exam no tenderness over the spine ribs or hips. He has some kyphosis. Extremities shows some age related changes. He has good range of motion and strength. Skin exam shows no rashes. Neurological exam is nonfocal.  Lab Results  Component Value Date   WBC 4.7 09/09/2014   HGB 12.3* 09/09/2014   HCT 35.8* 09/09/2014   MCV  91 09/09/2014   PLT 206 09/09/2014     Chemistry      Component Value Date/Time   NA 144 09/09/2014 1040   NA 140 07/15/2014 1345   K 4.2 09/09/2014 1040   K 4.6 07/15/2014 1345   CL 103 09/09/2014 1040   CL 106 07/15/2014 1345   CO2 28 09/09/2014 1040   CO2 27 07/15/2014 1345   BUN 15 09/09/2014 1040   BUN 18 07/15/2014 1345   CREATININE 1.2 09/09/2014 1040   CREATININE 1.41* 07/15/2014 1345      Component Value Date/Time   CALCIUM 8.7 09/09/2014 1040   CALCIUM 8.7 07/15/2014 1345   ALKPHOS 91* 09/09/2014 1040   ALKPHOS 101 07/15/2014 1345   AST 20 09/09/2014 1040   AST 18 07/15/2014 1345   ALT 19 09/09/2014 1040   ALT 15 07/15/2014 1345   BILITOT 0.90 09/09/2014 1040    BILITOT 1.3* 07/15/2014 1345       Impression and Plan: Christian Burns is 81 year old gentleman with IgG lambda myeloma. He's on Pomalidomide/Ninlaro. It is still too early to know how well things are working. I probably will not know until we see him back in one month. I will check a 24 hour urine on him.  We will go ahead and give him Zometa today.    I will plan to see him back in another 4 weeks.   I had a long talk with him. I told him about the myeloma and we do not know what the prognosis would be. I think that with the change in program, he should still do very well.      [name], MD 1/29/201612:06 PM

## 2014-09-13 ENCOUNTER — Other Ambulatory Visit: Payer: Self-pay | Admitting: Nurse Practitioner

## 2014-09-13 DIAGNOSIS — C9000 Multiple myeloma not having achieved remission: Secondary | ICD-10-CM

## 2014-09-13 LAB — KAPPA/LAMBDA LIGHT CHAINS
Kappa free light chain: 3.8 mg/dL — ABNORMAL HIGH (ref 0.33–1.94)
Kappa:Lambda Ratio: 0.27 (ref 0.26–1.65)
LAMBDA FREE LGHT CHN: 14 mg/dL — AB (ref 0.57–2.63)

## 2014-09-13 LAB — PROTEIN ELECTROPHORESIS, SERUM, WITH REFLEX
ALPHA-2-GLOBULIN: 11.2 % (ref 7.1–11.8)
Albumin ELP: 50.6 % — ABNORMAL LOW (ref 55.8–66.1)
Alpha-1-Globulin: 4.6 % (ref 2.9–4.9)
Beta 2: 4.5 % (ref 3.2–6.5)
Beta Globulin: 5.4 % (ref 4.7–7.2)
Gamma Globulin: 23.7 % — ABNORMAL HIGH (ref 11.1–18.8)
M-Spike, %: 1.07 g/dL
Total Protein, Serum Electrophoresis: 7.4 g/dL (ref 6.0–8.3)

## 2014-09-13 LAB — IGG, IGA, IGM
IGA: 261 mg/dL (ref 68–379)
IGG (IMMUNOGLOBIN G), SERUM: 1780 mg/dL — AB (ref 650–1600)
IGM, SERUM: 80 mg/dL (ref 41–251)

## 2014-09-13 LAB — IFE INTERPRETATION

## 2014-09-13 MED ORDER — POMALIDOMIDE 2 MG PO CAPS: 2.0000 mg | ORAL_CAPSULE | Freq: Every day | ORAL | Status: DC

## 2014-10-05 ENCOUNTER — Other Ambulatory Visit: Payer: Self-pay | Admitting: *Deleted

## 2014-10-05 DIAGNOSIS — C9000 Multiple myeloma not having achieved remission: Secondary | ICD-10-CM

## 2014-10-05 MED ORDER — POMALIDOMIDE 2 MG PO CAPS: 2.0000 mg | ORAL_CAPSULE | Freq: Every day | ORAL | Status: DC

## 2014-10-07 ENCOUNTER — Encounter: Payer: Self-pay | Admitting: Hematology & Oncology

## 2014-10-07 ENCOUNTER — Ambulatory Visit (HOSPITAL_BASED_OUTPATIENT_CLINIC_OR_DEPARTMENT_OTHER): Payer: Medicare HMO

## 2014-10-07 ENCOUNTER — Ambulatory Visit (HOSPITAL_BASED_OUTPATIENT_CLINIC_OR_DEPARTMENT_OTHER): Payer: Medicare HMO | Admitting: Hematology & Oncology

## 2014-10-07 ENCOUNTER — Ambulatory Visit (HOSPITAL_BASED_OUTPATIENT_CLINIC_OR_DEPARTMENT_OTHER): Payer: Medicare HMO | Admitting: Lab

## 2014-10-07 VITALS — BP 166/78 | HR 61 | Temp 97.9°F | Resp 18 | Wt 156.0 lb

## 2014-10-07 DIAGNOSIS — R944 Abnormal results of kidney function studies: Secondary | ICD-10-CM

## 2014-10-07 DIAGNOSIS — C9000 Multiple myeloma not having achieved remission: Secondary | ICD-10-CM

## 2014-10-07 DIAGNOSIS — C9002 Multiple myeloma in relapse: Secondary | ICD-10-CM

## 2014-10-07 LAB — CMP (CANCER CENTER ONLY)
ALK PHOS: 92 U/L — AB (ref 26–84)
ALT(SGPT): 17 U/L (ref 10–47)
AST: 20 U/L (ref 11–38)
Albumin: 3.2 g/dL — ABNORMAL LOW (ref 3.3–5.5)
BILIRUBIN TOTAL: 1.2 mg/dL (ref 0.20–1.60)
BUN, Bld: 21 mg/dL (ref 7–22)
CO2: 29 mEq/L (ref 18–33)
CREATININE: 1.4 mg/dL — AB (ref 0.6–1.2)
Calcium: 9.2 mg/dL (ref 8.0–10.3)
Chloride: 103 mEq/L (ref 98–108)
GLUCOSE: 84 mg/dL (ref 73–118)
Potassium: 4.8 mEq/L — ABNORMAL HIGH (ref 3.3–4.7)
SODIUM: 146 meq/L — AB (ref 128–145)
TOTAL PROTEIN: 7.4 g/dL (ref 6.4–8.1)

## 2014-10-07 LAB — CBC WITH DIFFERENTIAL (CANCER CENTER ONLY)
BASO#: 0.1 10*3/uL (ref 0.0–0.2)
BASO%: 2.5 % — ABNORMAL HIGH (ref 0.0–2.0)
EOS%: 5.5 % (ref 0.0–7.0)
Eosinophils Absolute: 0.3 10*3/uL (ref 0.0–0.5)
HEMATOCRIT: 35 % — AB (ref 38.7–49.9)
HEMOGLOBIN: 11.9 g/dL — AB (ref 13.0–17.1)
LYMPH#: 0.6 10*3/uL — ABNORMAL LOW (ref 0.9–3.3)
LYMPH%: 11.5 % — AB (ref 14.0–48.0)
MCH: 31.2 pg (ref 28.0–33.4)
MCHC: 34 g/dL (ref 32.0–35.9)
MCV: 92 fL (ref 82–98)
MONO#: 1 10*3/uL — AB (ref 0.1–0.9)
MONO%: 19.8 % — ABNORMAL HIGH (ref 0.0–13.0)
NEUT%: 60.7 % (ref 40.0–80.0)
NEUTROS ABS: 3.2 10*3/uL (ref 1.5–6.5)
Platelets: 173 10*3/uL (ref 145–400)
RBC: 3.82 10*6/uL — ABNORMAL LOW (ref 4.20–5.70)
RDW: 14.6 % (ref 11.1–15.7)
WBC: 5.2 10*3/uL (ref 4.0–10.0)

## 2014-10-07 MED ORDER — ZOLEDRONIC ACID 4 MG/5ML IV CONC
3.3000 mg | Freq: Once | INTRAVENOUS | Status: AC
  Administered 2014-10-07: 3.3 mg via INTRAVENOUS
  Filled 2014-10-07: qty 4.13

## 2014-10-07 MED ORDER — SODIUM CHLORIDE 0.9 % IV SOLN
INTRAVENOUS | Status: DC
  Administered 2014-10-07: 13:00:00 via INTRAVENOUS

## 2014-10-07 NOTE — Progress Notes (Signed)
Hematology and Oncology Follow Up Visit  Christian Burns [id number] 04/05/1930 81 y.o. 10/07/2014   Principle Diagnosis:   Recurrent IgG lambda myeloma - progressive  Current Therapy:    Pomalidomide 2 mg by mouth daily (21/7)/Ixazomib 4mg  po q week (3/1)  Zometa 3.3 mg IV q. 4 weeks     Interim History:  Christian Burns is back for followup.   He is feeling well. He is doing well with the Ninlaro. So far, he's had no problems with it. He has completed 2 cycles. He will start the next cycle along with the Pomalidomide in 2 weeks.  We have not yet seen a response to the addition of Ninlaro. His last M spike was 1.07 g/dL. His IgG level was 1780 mg/dL. His lambda light chain was 14 mg/dL.   He's had no nausea or vomiting. He's had no change in bowel or bladder habits.  He may feel a little bit more fatigue.  He had a "cold" last week. Had a little bit of a cough. This is nonproductive.  His performance status is ECOG 1-2.   He's not complaining of any lower back pain. He has some neuropathy in his feet. This is about the same. He takes vitamin B 6 and vitamin B-12 for this.  His appetite is good. He and his wife will be going out tonight for their weekly for a night dinner. His weight holding steady.   Medications:  Current outpatient prescriptions:  .  aspirin EC 81 MG tablet, Take 162 mg by mouth daily. , Disp: , Rfl:  .  Calcium Carb-Cholecalciferol (CALCIUM 600 + D PO), Take by mouth every morning., Disp: , Rfl:  .  Cyanocobalamin (VITAMIN B 12 PO), Take 1,000 mcg by mouth every morning. , Disp: , Rfl:  .  Multiple Vitamins-Minerals (MULTIVITAL PO), Take 1 tablet by mouth every morning. , Disp: , Rfl:  .  NON FORMULARY, Take 4 mg by mouth. Take one tablet on day #1, #8 and #15 every 28 days., Disp: , Rfl:  .  pomalidomide (POMALYST) 2 MG capsule, Take 1 capsule (2 mg total) by mouth daily. Take with water on days 1-21. Repeat every 28 days. Auth# [id number], Disp: 21 capsule, Rfl:  0 .  Pyridoxine HCl (VITAMIN B-6) 250 MG tablet, Take 500 mg by mouth daily., Disp: , Rfl:  .  Vitamin D, Ergocalciferol, (DRISDOL) 50000 UNITS CAPS capsule, Take 50,000 Units by mouth every 7 (seven) days. Sundays, Disp: , Rfl:  No current facility-administered medications for this visit.  Facility-Administered Medications Ordered in Other Visits:  .  zolendronic acid (ZOMETA) 3.3 mg in sodium chloride 0.9 % 100 mL IVPB, 3.3 mg, Intravenous, Once, Helayne Seminole, PA-C  Allergies:  Allergies  Allergen Reactions  . Sulfa Antibiotics Itching    Past Medical History, Surgical history, Social history, and Family History were reviewed and updated.  Review of Systems: As above  Physical Exam:  weight is 156 lb (70.761 kg). His oral temperature is 97.9 F (36.6 C). His blood pressure is 166/78 and his pulse is 61. His respiration is 18.   Thin, elderly gentleman. He is no distress. Head and neck exam shows no ocular or oral lesions. He has no palpable cervical or supraclavicular lymph nodes. Lungs are clear bilaterally. Cardiac exam is regular rate and rhythm with no murmurs rubs or bruits. Abdomen is soft. He has good bowel sounds. There is no fluid wave. There is no palpable liver or spleen. Back exam  no tenderness over the spine ribs or hips. He has some kyphosis. Extremities shows some age related changes. He has good range of motion and strength. Skin exam shows no rashes. Neurological exam is nonfocal.  Lab Results  Component Value Date   WBC 5.2 10/07/2014   HGB 11.9* 10/07/2014   HCT 35.0* 10/07/2014   MCV 92 10/07/2014   PLT 173 10/07/2014     Chemistry      Component Value Date/Time   NA 146* 10/07/2014 1148   NA 140 07/15/2014 1345   K 4.8* 10/07/2014 1148   K 4.6 07/15/2014 1345   CL 103 10/07/2014 1148   CL 106 07/15/2014 1345   CO2 29 10/07/2014 1148   CO2 27 07/15/2014 1345   BUN 21 10/07/2014 1148   BUN 18 07/15/2014 1345   CREATININE 1.4* 10/07/2014 1148    CREATININE 1.41* 07/15/2014 1345      Component Value Date/Time   CALCIUM 9.2 10/07/2014 1148   CALCIUM 8.7 07/15/2014 1345   ALKPHOS 92* 10/07/2014 1148   ALKPHOS 101 07/15/2014 1345   AST 20 10/07/2014 1148   AST 18 07/15/2014 1345   ALT 17 10/07/2014 1148   ALT 15 07/15/2014 1345   BILITOT 1.20 10/07/2014 1148   BILITOT 1.3* 07/15/2014 1345       Impression and Plan: Christian Burns is 81 year old gentleman with IgG lambda myeloma. He's on Pomalidomide/Ninlaro. It will be very interesting to see how his myeloma studies look.  His creatinine is up a little bit. I did tell him to try to drink a little more fluid. I still think we can give him Zometa today. Zometa today.  I will plan to see him back in another 4 weeks.   I had a long talk with him. I told him about the myeloma and we do not know what the prognosis would be. I think that with the change in program, he should still do very well.      [name], MD 2/26/20161:12 PM

## 2014-10-07 NOTE — Patient Instructions (Signed)

## 2014-10-11 LAB — PROTEIN ELECTROPHORESIS, SERUM, WITH REFLEX
ALBUMIN ELP: 52.4 % — AB (ref 55.8–66.1)
ALPHA-1-GLOBULIN: 4 % (ref 2.9–4.9)
Alpha-2-Globulin: 9.7 % (ref 7.1–11.8)
Beta 2: 4.6 % (ref 3.2–6.5)
Beta Globulin: 5.4 % (ref 4.7–7.2)
Gamma Globulin: 23.9 % — ABNORMAL HIGH (ref 11.1–18.8)
M-Spike, %: 0.99 g/dL
Total Protein, Serum Electrophoresis: 7.1 g/dL (ref 6.0–8.3)

## 2014-10-11 LAB — KAPPA/LAMBDA LIGHT CHAINS
KAPPA FREE LGHT CHN: 3.36 mg/dL — AB (ref 0.33–1.94)
Kappa:Lambda Ratio: 0.3 (ref 0.26–1.65)
Lambda Free Lght Chn: 11.3 mg/dL — ABNORMAL HIGH (ref 0.57–2.63)

## 2014-10-11 LAB — IGG, IGA, IGM
IGG (IMMUNOGLOBIN G), SERUM: 1890 mg/dL — AB (ref 650–1600)
IgA: 223 mg/dL (ref 68–379)
IgM, Serum: 66 mg/dL (ref 41–251)

## 2014-10-11 LAB — IFE INTERPRETATION

## 2014-10-13 ENCOUNTER — Telehealth: Payer: Self-pay | Admitting: *Deleted

## 2014-10-13 NOTE — Telephone Encounter (Signed)
-----   Message from [name], MD sent at 10/12/2014  5:19 PM EST ----- Call - myeloma level is stable!!  pete

## 2014-10-21 ENCOUNTER — Ambulatory Visit: Payer: Medicare HMO

## 2014-10-21 ENCOUNTER — Other Ambulatory Visit: Payer: Medicare HMO | Admitting: Lab

## 2014-10-21 ENCOUNTER — Ambulatory Visit: Payer: Medicare HMO | Admitting: Hematology & Oncology

## 2014-11-04 ENCOUNTER — Ambulatory Visit (HOSPITAL_BASED_OUTPATIENT_CLINIC_OR_DEPARTMENT_OTHER): Payer: Medicare HMO

## 2014-11-04 ENCOUNTER — Other Ambulatory Visit: Payer: Self-pay | Admitting: Hematology & Oncology

## 2014-11-04 ENCOUNTER — Encounter: Payer: Self-pay | Admitting: Hematology & Oncology

## 2014-11-04 ENCOUNTER — Ambulatory Visit (HOSPITAL_BASED_OUTPATIENT_CLINIC_OR_DEPARTMENT_OTHER)
Admission: RE | Admit: 2014-11-04 | Discharge: 2014-11-04 | Disposition: A | Discharge status: Home / Self Care | Payer: Medicare HMO | Source: Ambulatory Visit | Attending: Hematology & Oncology | Admitting: Hematology & Oncology

## 2014-11-04 ENCOUNTER — Ambulatory Visit (HOSPITAL_BASED_OUTPATIENT_CLINIC_OR_DEPARTMENT_OTHER): Payer: Medicare HMO | Admitting: Hematology & Oncology

## 2014-11-04 VITALS — BP 153/74 | HR 60 | Temp 97.5°F | Resp 18 | Ht 68.0 in | Wt 156.0 lb

## 2014-11-04 DIAGNOSIS — C9000 Multiple myeloma not having achieved remission: Secondary | ICD-10-CM

## 2014-11-04 DIAGNOSIS — M25551 Pain in right hip: Secondary | ICD-10-CM

## 2014-11-04 LAB — CMP (CANCER CENTER ONLY)
ALBUMIN: 3.3 g/dL (ref 3.3–5.5)
ALT(SGPT): 17 U/L (ref 10–47)
AST: 21 U/L (ref 11–38)
Alkaline Phosphatase: 89 U/L — ABNORMAL HIGH (ref 26–84)
BUN: 22 mg/dL (ref 7–22)
CALCIUM: 9 mg/dL (ref 8.0–10.3)
CHLORIDE: 107 meq/L (ref 98–108)
CO2: 28 meq/L (ref 18–33)
Creat: 1.4 mg/dl — ABNORMAL HIGH (ref 0.6–1.2)
GLUCOSE: 111 mg/dL (ref 73–118)
POTASSIUM: 4.1 meq/L (ref 3.3–4.7)
Sodium: 141 mEq/L (ref 128–145)
TOTAL PROTEIN: 7.8 g/dL (ref 6.4–8.1)
Total Bilirubin: 1.6 mg/dl (ref 0.20–1.60)

## 2014-11-04 LAB — CBC WITH DIFFERENTIAL (CANCER CENTER ONLY)
BASO#: 0.1 10*3/uL (ref 0.0–0.2)
BASO%: 2.5 % — AB (ref 0.0–2.0)
EOS ABS: 0.2 10*3/uL (ref 0.0–0.5)
EOS%: 5.4 % (ref 0.0–7.0)
HCT: 34.1 % — ABNORMAL LOW (ref 38.7–49.9)
HGB: 11.7 g/dL — ABNORMAL LOW (ref 13.0–17.1)
LYMPH#: 0.5 10*3/uL — ABNORMAL LOW (ref 0.9–3.3)
LYMPH%: 11.2 % — ABNORMAL LOW (ref 14.0–48.0)
MCH: 31.1 pg (ref 28.0–33.4)
MCHC: 34.3 g/dL (ref 32.0–35.9)
MCV: 91 fL (ref 82–98)
MONO#: 0.8 10*3/uL (ref 0.1–0.9)
MONO%: 17.9 % — ABNORMAL HIGH (ref 0.0–13.0)
NEUT%: 63 % (ref 40.0–80.0)
NEUTROS ABS: 2.8 10*3/uL (ref 1.5–6.5)
PLATELETS: 183 10*3/uL (ref 145–400)
RBC: 3.76 10*6/uL — AB (ref 4.20–5.70)
RDW: 14.9 % (ref 11.1–15.7)
WBC: 4.5 10*3/uL (ref 4.0–10.0)

## 2014-11-04 MED ORDER — SODIUM CHLORIDE 0.9 % IV SOLN
3.3000 mg | Freq: Once | INTRAVENOUS | Status: AC
  Administered 2014-11-04: 3.3 mg via INTRAVENOUS
  Filled 2014-11-04: qty 4.13

## 2014-11-04 NOTE — Progress Notes (Signed)
Hematology and Oncology Follow Up Visit  PANAGIOTIS OELKERS [id number] March 26, 1930 81 y.o. 11/04/2014   Principle Diagnosis:   Recurrent IgG lambda myeloma - progressive  Current Therapy:    Pomalidomide 2 mg by mouth daily (21/7)/Ixazomib 4mg  po q week (3/1)  Zometa 3.3 mg IV q. 4 weeks     Interim History:  Mr.  Kyser is back for followup.   He is feeling well. He is doing well with the Ninlaro. So far, he's had no problems with it. He has completed 3 cycles. He will start the next cycle along with the Pomalidomide in 2 weeks.  We are starting to see a response. His eyes and spike was down to 0.99 g/dL. His lambda light chain was 11.3 mg/dL. His IgG level was 1890 mg/dL.  His complaint today is that he has pain in the right hip and upper thigh. This happened a couple days ago. He was playing with his great grandchild a lot. There is no pain radiation. He has no weakness. I think we probably have to do an x-ray to make sure there is no myelomatous lesion that could be a factor that would have to be treated.   He's had no nausea or vomiting. He's had no change in bowel or bladder habits.  He may feel a little bit more fatigue.  His performance status is ECOG 1-2.   He's not complaining of any lower back pain. He has some neuropathy in his feet. This is about the same. He takes vitamin B 6 and vitamin B-12 for this.  His appetite is good. He and his wife will be going out tonight for their weekly Friday night dinner. His weight is holding steady.   Medications:  Current outpatient prescriptions:  .  aspirin EC 81 MG tablet, Take 162 mg by mouth daily. , Disp: , Rfl:  .  Calcium Carb-Cholecalciferol (CALCIUM 600 + D PO), Take by mouth every morning., Disp: , Rfl:  .  Cyanocobalamin (VITAMIN B 12 PO), Take 1,000 mcg by mouth every morning. , Disp: , Rfl:  .  ixazomib citrate (NINLARO) 4 MG capsule, Take 4 mg by mouth See admin instructions. Take on an empty stomach 1hr before food  Day  #1, then day # 8, then day # 15, then on day # 28 repeat., Disp: , Rfl:  .  Multiple Vitamins-Minerals (MULTIVITAL PO), Take 1 tablet by mouth every morning. , Disp: , Rfl:  .  pomalidomide (POMALYST) 2 MG capsule, Take 1 capsule (2 mg total) by mouth daily. Take with water on days 1-21. Repeat every 28 days. Auth# [id number], Disp: 21 capsule, Rfl: 0 .  Pyridoxine HCl (VITAMIN B-6) 250 MG tablet, Take 500 mg by mouth daily., Disp: , Rfl:  .  Vitamin D, Ergocalciferol, (DRISDOL) 50000 UNITS CAPS capsule, Take 50,000 Units by mouth every 7 (seven) days. Sundays, Disp: , Rfl:   Allergies:  Allergies  Allergen Reactions  . Sulfa Antibiotics Itching    Past Medical History, Surgical history, Social history, and Family History were reviewed and updated.  Review of Systems: As above  Physical Exam:  height is 5\' 8"  (1.727 m) and weight is 156 lb (70.761 kg). His oral temperature is 97.5 F (36.4 C). His blood pressure is 153/74 and his pulse is 60. His respiration is 18.   Thin, elderly gentleman. He is no distress. Head and neck exam shows no ocular or oral lesions. He has no palpable cervical or supraclavicular lymph nodes. Lungs  are clear bilaterally. Cardiac exam is regular rate and rhythm with no murmurs rubs or bruits. Abdomen is soft. He has good bowel sounds. There is no fluid wave. There is no palpable liver or spleen. Back exam no tenderness over the spine ribs or hips. He has some kyphosis. Extremities shows some age related changes. He has good range of motion and strength. Skin exam shows no rashes. Neurological exam is nonfocal.  Lab Results  Component Value Date   WBC 4.5 11/04/2014   HGB 11.7* 11/04/2014   HCT 34.1* 11/04/2014   MCV 91 11/04/2014   PLT 183 11/04/2014     Chemistry      Component Value Date/Time   NA 141 11/04/2014 1038   NA 140 07/15/2014 1345   K 4.1 11/04/2014 1038   K 4.6 07/15/2014 1345   CL 107 11/04/2014 1038   CL 106 07/15/2014 1345   CO2 28  11/04/2014 1038   CO2 27 07/15/2014 1345   BUN 22 11/04/2014 1038   BUN 18 07/15/2014 1345   CREATININE 1.4* 11/04/2014 1038   CREATININE 1.41* 07/15/2014 1345      Component Value Date/Time   CALCIUM 9.0 11/04/2014 1038   CALCIUM 8.7 07/15/2014 1345   ALKPHOS 89* 11/04/2014 1038   ALKPHOS 101 07/15/2014 1345   AST 21 11/04/2014 1038   AST 18 07/15/2014 1345   ALT 17 11/04/2014 1038   ALT 15 07/15/2014 1345   BILITOT 1.60 11/04/2014 1038   BILITOT 1.3* 07/15/2014 1345       Impression and Plan: Mr. Littlefield is 81 year old gentleman with IgG lambda myeloma. He's on Pomalidomide/Ninlaro. His last myeloma studies were started to come down a little bit. Hopefully, we will see a continued response.  His creatinine is up a little bit. I did tell him to try to drink a little more fluid. I still think we can give him Zometa today. Zometa today.  I'm not sure was going on with the right hip. We will go ahead and get an x-ray of this. We will set worry about myeloma involvement.  I will plan to see him back in another 4 weeks.   I had a long talk with him. I told him about the myeloma and we do not know what the prognosis would be. I think that with the change in program, he should still do very well.      [name], MD 3/25/201612:33 PM

## 2014-11-04 NOTE — Patient Instructions (Signed)

## 2014-11-08 ENCOUNTER — Telehealth: Payer: Self-pay | Admitting: Nurse Practitioner

## 2014-11-08 LAB — PROTEIN ELECTROPHORESIS, SERUM, WITH REFLEX
Abnormal Protein Band1: 1.1 g/dL
Albumin ELP: 3.7 g/dL — ABNORMAL LOW (ref 3.8–4.8)
Alpha-1-Globulin: 0.3 g/dL (ref 0.2–0.3)
Alpha-2-Globulin: 0.7 g/dL (ref 0.5–0.9)
BETA 2: 0.3 g/dL (ref 0.2–0.5)
Beta Globulin: 0.4 g/dL (ref 0.4–0.6)
Gamma Globulin: 1.8 g/dL — ABNORMAL HIGH (ref 0.8–1.7)
Total Protein, Serum Electrophoresis: 7.2 g/dL (ref 6.1–8.1)

## 2014-11-08 LAB — KAPPA/LAMBDA LIGHT CHAINS
KAPPA FREE LGHT CHN: 4.55 mg/dL — AB (ref 0.33–1.94)
Kappa:Lambda Ratio: 0.3 (ref 0.26–1.65)
Lambda Free Lght Chn: 15.2 mg/dL — ABNORMAL HIGH (ref 0.57–2.63)

## 2014-11-08 LAB — IGG, IGA, IGM
IGA: 224 mg/dL (ref 68–379)
IGM, SERUM: 64 mg/dL (ref 41–251)
IgG (Immunoglobin G), Serum: 1840 mg/dL — ABNORMAL HIGH (ref 650–1600)

## 2014-11-08 LAB — IFE INTERPRETATION

## 2014-11-08 NOTE — Telephone Encounter (Addendum)
Patient verbalized understanding and appreciation.   ----- Message from [name], MD sent at 11/04/2014  4:52 PM EDT ----- Call - no obvious bone abnormalities!!!  No myeloma seen!!  pete

## 2014-11-11 ENCOUNTER — Other Ambulatory Visit: Payer: Self-pay | Admitting: Nurse Practitioner

## 2014-11-11 DIAGNOSIS — C9000 Multiple myeloma not having achieved remission: Secondary | ICD-10-CM

## 2014-11-11 MED ORDER — POMALIDOMIDE 2 MG PO CAPS: 2.0000 mg | ORAL_CAPSULE | Freq: Every day | ORAL | Status: DC

## 2014-11-12 LAB — UIFE/LIGHT CHAINS/TP QN, 24-HR UR
ALPHA 1 UR: DETECTED — AB
ALPHA 2 UR: DETECTED — AB
Albumin, U: DETECTED
BETA UR: DETECTED — AB
Gamma Globulin, Urine: DETECTED — AB
TOTAL PROTEIN, URINE-UPE24: 69 mg/dL — AB (ref 5–25)
TOTAL PROTEIN, URINE-UR/DAY: 897 mg/d — AB (ref <150)
Time: 24 hours
Volume, Urine: 1300 mL

## 2014-11-12 LAB — 24 HR URINE,KAPPA/LAMBDA LIGHT CHAINS
24H Urine Volume: 1300 mL/24 h
Measured Kappa Chain: 3.45 mg/dL — ABNORMAL HIGH (ref <2.00)
Measured Lambda Chain: 5.24 mg/dL — ABNORMAL HIGH (ref <2.00)
Total Kappa Chain: 44.85 mg/24 h
Total Lambda Chain: 68.12 mg/24 h

## 2014-11-14 ENCOUNTER — Other Ambulatory Visit: Payer: Self-pay | Admitting: *Deleted

## 2014-11-14 DIAGNOSIS — C9000 Multiple myeloma not having achieved remission: Secondary | ICD-10-CM

## 2014-12-02 ENCOUNTER — Ambulatory Visit (HOSPITAL_BASED_OUTPATIENT_CLINIC_OR_DEPARTMENT_OTHER): Payer: Medicare HMO

## 2014-12-02 ENCOUNTER — Encounter: Payer: Self-pay | Admitting: Hematology & Oncology

## 2014-12-02 ENCOUNTER — Ambulatory Visit (HOSPITAL_BASED_OUTPATIENT_CLINIC_OR_DEPARTMENT_OTHER): Payer: Medicare HMO | Admitting: Hematology & Oncology

## 2014-12-02 VITALS — BP 146/70 | HR 64 | Temp 97.4°F | Resp 18 | Ht 68.0 in | Wt 154.0 lb

## 2014-12-02 DIAGNOSIS — C9002 Multiple myeloma in relapse: Secondary | ICD-10-CM

## 2014-12-02 DIAGNOSIS — M25551 Pain in right hip: Secondary | ICD-10-CM

## 2014-12-02 DIAGNOSIS — C9000 Multiple myeloma not having achieved remission: Secondary | ICD-10-CM

## 2014-12-02 LAB — CMP (CANCER CENTER ONLY)
ALBUMIN: 3.1 g/dL — AB (ref 3.3–5.5)
ALK PHOS: 101 U/L — AB (ref 26–84)
ALT(SGPT): 17 U/L (ref 10–47)
AST: 23 U/L (ref 11–38)
BUN: 25 mg/dL — AB (ref 7–22)
CO2: 26 mEq/L (ref 18–33)
CREATININE: 1.3 mg/dL — AB (ref 0.6–1.2)
Calcium: 8.5 mg/dL (ref 8.0–10.3)
Chloride: 108 mEq/L (ref 98–108)
GLUCOSE: 107 mg/dL (ref 73–118)
Potassium: 4.2 mEq/L (ref 3.3–4.7)
Sodium: 143 mEq/L (ref 128–145)
Total Bilirubin: 1.1 mg/dl (ref 0.20–1.60)
Total Protein: 7.5 g/dL (ref 6.4–8.1)

## 2014-12-02 LAB — CBC WITH DIFFERENTIAL (CANCER CENTER ONLY)
BASO#: 0.1 10*3/uL (ref 0.0–0.2)
BASO%: 1.6 % (ref 0.0–2.0)
EOS%: 3.6 % (ref 0.0–7.0)
Eosinophils Absolute: 0.2 10*3/uL (ref 0.0–0.5)
HCT: 33.4 % — ABNORMAL LOW (ref 38.7–49.9)
HGB: 11.5 g/dL — ABNORMAL LOW (ref 13.0–17.1)
LYMPH#: 0.5 10*3/uL — ABNORMAL LOW (ref 0.9–3.3)
LYMPH%: 9.7 % — ABNORMAL LOW (ref 14.0–48.0)
MCH: 31.2 pg (ref 28.0–33.4)
MCHC: 34.4 g/dL (ref 32.0–35.9)
MCV: 91 fL (ref 82–98)
MONO#: 0.9 10*3/uL (ref 0.1–0.9)
MONO%: 16.5 % — ABNORMAL HIGH (ref 0.0–13.0)
NEUT%: 68.6 % (ref 40.0–80.0)
NEUTROS ABS: 3.8 10*3/uL (ref 1.5–6.5)
PLATELETS: 148 10*3/uL (ref 145–400)
RBC: 3.69 10*6/uL — ABNORMAL LOW (ref 4.20–5.70)
RDW: 14.4 % (ref 11.1–15.7)
WBC: 5.6 10*3/uL (ref 4.0–10.0)

## 2014-12-02 MED ORDER — ZOLEDRONIC ACID 4 MG/5ML IV CONC
3.3000 mg | Freq: Once | INTRAVENOUS | Status: AC
  Administered 2014-12-02: 3.3 mg via INTRAVENOUS
  Filled 2014-12-02: qty 4.13

## 2014-12-02 NOTE — Progress Notes (Signed)
Hematology and Oncology Follow Up Visit  Christian Burns [id number] 1930-03-19 81 y.o. 12/02/2014   Principle Diagnosis:   Recurrent IgG lambda myeloma - progressive  Current Therapy:    Pomalidomide 2 mg by mouth daily (21/7)/Ixazomib 4mg  po q week (3/1)  Zometa 3.3 mg IV q. 4 weeks     Interim History:  Mr.  Christian Burns is back for followup.   He is feeling well. He is doing well with the Ninlaro. So far, he's had no problems with it. He has completed 4 cycles. He will start the next cycle along with the Pomalidomide in 2 weeks.  His myeloma numbers have been holding pretty steady. His IgG level is 1840 mg/dL. His lambda light chain is 15.2 mg/dL.  He did do a 24-hour urine. Unfortunately, his heart tell how much protein is in there by the new reporting system.   He's had no nausea or vomiting. He's had no change in bowel or bladder habits.  He may feel a little bit more fatigue.  His performance status is ECOG 1-2.   He's not complaining of any lower back pain. He has some neuropathy in his feet. This is about the same. He takes vitamin B 6 and vitamin B-12 for this.  His appetite is good. Unfortunately, he and his wife cannot go out anymore because of her bad knees. She is not a candidate for any surgery. Medications:  Current outpatient prescriptions:  .  aspirin EC 81 MG tablet, Take 162 mg by mouth daily. , Disp: , Rfl:  .  Calcium Carb-Cholecalciferol (CALCIUM 600 + D PO), Take by mouth every morning., Disp: , Rfl:  .  Cyanocobalamin (VITAMIN B 12 PO), Take 1,000 mcg by mouth every morning. , Disp: , Rfl:  .  ixazomib citrate (NINLARO) 4 MG capsule, Take 4 mg by mouth See admin instructions. Take on an empty stomach 1hr before food  Day #1, then day # 8, then day # 15, then on day # 28 repeat., Disp: , Rfl:  .  Multiple Vitamins-Minerals (MULTIVITAL PO), Take 1 tablet by mouth every morning. , Disp: , Rfl:  .  pomalidomide (POMALYST) 2 MG capsule, Take 1 capsule (2 mg total) by  mouth daily. Take with water on days 1-21. Repeat every 28 days. Auth# [id number], Disp: 21 capsule, Rfl: 0 .  Pyridoxine HCl (VITAMIN B-6) 250 MG tablet, Take 500 mg by mouth daily., Disp: , Rfl:  .  Vitamin D, Ergocalciferol, (DRISDOL) 50000 UNITS CAPS capsule, Take 50,000 Units by mouth every 7 (seven) days. Sundays, Disp: , Rfl:   Allergies:  Allergies  Allergen Reactions  . Sulfa Antibiotics Itching    Past Medical History, Surgical history, Social history, and Family History were reviewed and updated.  Review of Systems: As above  Physical Exam:  height is 5\' 8"  (1.727 m) and weight is 154 lb (69.854 kg). His oral temperature is 97.4 F (36.3 C). His blood pressure is 146/70 and his pulse is 64. His respiration is 18.   Thin, elderly gentleman. He is no distress. Head and neck exam shows no ocular or oral lesions. He has no palpable cervical or supraclavicular lymph nodes. Lungs are clear bilaterally. Cardiac exam is regular rate and rhythm with no murmurs rubs or bruits. Abdomen is soft. He has good bowel sounds. There is no fluid wave. There is no palpable liver or spleen. Back exam no tenderness over the spine ribs or hips. He has some kyphosis. Extremities shows some age  related changes. He has good range of motion and strength. Skin exam shows no rashes. Neurological exam is nonfocal.  Lab Results  Component Value Date   WBC 5.6 12/02/2014   HGB 11.5* 12/02/2014   HCT 33.4* 12/02/2014   MCV 91 12/02/2014   PLT 148 12/02/2014     Chemistry      Component Value Date/Time   NA 143 12/02/2014 1053   NA 140 07/15/2014 1345   K 4.2 12/02/2014 1053   K 4.6 07/15/2014 1345   CL 108 12/02/2014 1053   CL 106 07/15/2014 1345   CO2 26 12/02/2014 1053   CO2 27 07/15/2014 1345   BUN 25* 12/02/2014 1053   BUN 18 07/15/2014 1345   CREATININE 1.3* 12/02/2014 1053   CREATININE 1.41* 07/15/2014 1345      Component Value Date/Time   CALCIUM 8.5 12/02/2014 1053   CALCIUM 8.7  07/15/2014 1345   ALKPHOS 101* 12/02/2014 1053   ALKPHOS 101 07/15/2014 1345   AST 23 12/02/2014 1053   AST 18 07/15/2014 1345   ALT 17 12/02/2014 1053   ALT 15 07/15/2014 1345   BILITOT 1.10 12/02/2014 1053   BILITOT 1.3* 07/15/2014 1345       Impression and Plan: [name] is 81 year old gentleman with IgG lambda myeloma. He's on Pomalidomide/Ninlaro. His last myeloma studies were pretty stable.  His creatinine is a little bit better.. I did tell him to continue to drink a little more fluid. I still think we can give him Zometa today.   I will plan to see him back in another 4 weeks.   I had a long talk with him. I told him about the myeloma and we do not know what the prognosis would be.     [name], MD 4/22/201611:45 AM

## 2014-12-02 NOTE — Patient Instructions (Signed)

## 2014-12-06 LAB — PROTEIN ELECTROPHORESIS, SERUM, WITH REFLEX
ABNORMAL PROTEIN BAND1: 1 g/dL
Albumin ELP: 3.4 g/dL — ABNORMAL LOW (ref 3.8–4.8)
Alpha-1-Globulin: 0.3 g/dL (ref 0.2–0.3)
Alpha-2-Globulin: 0.8 g/dL (ref 0.5–0.9)
Beta 2: 0.3 g/dL (ref 0.2–0.5)
Beta Globulin: 0.4 g/dL (ref 0.4–0.6)
Gamma Globulin: 1.6 g/dL (ref 0.8–1.7)
TOTAL PROTEIN, SERUM ELECTROPHOR: 6.8 g/dL (ref 6.1–8.1)

## 2014-12-06 LAB — IGG, IGA, IGM
IGM, SERUM: 63 mg/dL (ref 41–251)
IgA: 232 mg/dL (ref 68–379)
IgG (Immunoglobin G), Serum: 1560 mg/dL (ref 650–1600)

## 2014-12-06 LAB — KAPPA/LAMBDA LIGHT CHAINS
KAPPA FREE LGHT CHN: 5.69 mg/dL — AB (ref 0.33–1.94)
Kappa:Lambda Ratio: 0.36 (ref 0.26–1.65)
LAMBDA FREE LGHT CHN: 15.6 mg/dL — AB (ref 0.57–2.63)

## 2014-12-06 LAB — LACTATE DEHYDROGENASE: LDH: 137 U/L (ref 94–250)

## 2014-12-06 LAB — IFE INTERPRETATION

## 2014-12-07 ENCOUNTER — Other Ambulatory Visit: Payer: Self-pay | Admitting: Nurse Practitioner

## 2014-12-07 DIAGNOSIS — C9000 Multiple myeloma not having achieved remission: Secondary | ICD-10-CM

## 2014-12-07 MED ORDER — POMALIDOMIDE 2 MG PO CAPS: 2.0000 mg | ORAL_CAPSULE | Freq: Every day | ORAL | Status: DC

## 2014-12-12 ENCOUNTER — Telehealth: Payer: Self-pay | Admitting: *Deleted

## 2014-12-12 NOTE — Telephone Encounter (Signed)
-----   Message from [name], MD sent at 12/07/2014  6:58 AM EDT ----- Call - myeloma is still low and stable!!  This is great!!  pete

## 2014-12-30 ENCOUNTER — Ambulatory Visit: Payer: Medicare HMO

## 2014-12-30 ENCOUNTER — Ambulatory Visit (HOSPITAL_BASED_OUTPATIENT_CLINIC_OR_DEPARTMENT_OTHER): Payer: Medicare HMO

## 2014-12-30 ENCOUNTER — Ambulatory Visit (HOSPITAL_BASED_OUTPATIENT_CLINIC_OR_DEPARTMENT_OTHER): Payer: Medicare HMO | Admitting: Hematology & Oncology

## 2014-12-30 VITALS — BP 144/75 | HR 58 | Temp 97.5°F | Resp 18 | Ht 68.0 in | Wt 153.0 lb

## 2014-12-30 DIAGNOSIS — C9000 Multiple myeloma not having achieved remission: Secondary | ICD-10-CM | POA: Diagnosis not present

## 2014-12-30 LAB — CBC WITH DIFFERENTIAL (CANCER CENTER ONLY)
BASO#: 0.1 10*3/uL (ref 0.0–0.2)
BASO%: 2.5 % — ABNORMAL HIGH (ref 0.0–2.0)
EOS%: 5.2 % (ref 0.0–7.0)
Eosinophils Absolute: 0.3 10*3/uL (ref 0.0–0.5)
HCT: 34.1 % — ABNORMAL LOW (ref 38.7–49.9)
HGB: 11.8 g/dL — ABNORMAL LOW (ref 13.0–17.1)
LYMPH#: 0.5 10*3/uL — ABNORMAL LOW (ref 0.9–3.3)
LYMPH%: 10.1 % — ABNORMAL LOW (ref 14.0–48.0)
MCH: 31.8 pg (ref 28.0–33.4)
MCHC: 34.6 g/dL (ref 32.0–35.9)
MCV: 92 fL (ref 82–98)
MONO#: 1 10*3/uL — AB (ref 0.1–0.9)
MONO%: 18.7 % — ABNORMAL HIGH (ref 0.0–13.0)
NEUT%: 63.5 % (ref 40.0–80.0)
NEUTROS ABS: 3.3 10*3/uL (ref 1.5–6.5)
PLATELETS: 198 10*3/uL (ref 145–400)
RBC: 3.71 10*6/uL — ABNORMAL LOW (ref 4.20–5.70)
RDW: 15.3 % (ref 11.1–15.7)
WBC: 5.2 10*3/uL (ref 4.0–10.0)

## 2014-12-30 LAB — CMP (CANCER CENTER ONLY)
ALBUMIN: 3.2 g/dL — AB (ref 3.3–5.5)
ALT(SGPT): 17 U/L (ref 10–47)
AST: 22 U/L (ref 11–38)
Alkaline Phosphatase: 91 U/L — ABNORMAL HIGH (ref 26–84)
BUN: 20 mg/dL (ref 7–22)
CALCIUM: 9 mg/dL (ref 8.0–10.3)
CHLORIDE: 105 meq/L (ref 98–108)
CO2: 30 mEq/L (ref 18–33)
CREATININE: 1.6 mg/dL — AB (ref 0.6–1.2)
GLUCOSE: 91 mg/dL (ref 73–118)
Potassium: 4.2 mEq/L (ref 3.3–4.7)
Sodium: 142 mEq/L (ref 128–145)
Total Bilirubin: 1.6 mg/dl (ref 0.20–1.60)
Total Protein: 7.6 g/dL (ref 6.4–8.1)

## 2014-12-30 NOTE — Progress Notes (Signed)
No Zometa today per dr. Marin Olp

## 2014-12-30 NOTE — Progress Notes (Signed)
Hematology and Oncology Follow Up Visit  Christian Burns [id number] [birth year] 81 y.o. 12/30/2014   Principle Diagnosis:   Recurrent IgG lambda myeloma - progressive  Current Therapy:    Pomalidomide 2 mg by mouth daily (21/7)/Ixazomib 4mg  po q week (3/1)  Zometa 3.3 mg IV q. 4 weeks     Interim History:  Mr.  Burns is back for followup.   He is feeling well. He is doing well with the Ninlaro. So far, he's had no problems with it. He has completed 5 cycles.   His myeloma studies have been holding pretty steady. His last M spike was 1.0 g/dL. His IgG level was 1560 mg/dL. His lambda light chain was 15.6 mg/dL.  His 24-hour urine that we did on him showed 197 mg of protein. Unfortunately it is hard to figure out how much light chain is in the urine now.  He's not complaining of any lower back pain. He has some neuropathy in his feet. This is about the same. He takes vitamin B 6 and vitamin B-12 for this.  He has the neuropathy in his feet. This is stable.  His appetite is good. Unfortunately, he and his wife cannot go out anymore because of her bad knees. She is not a candidate for any surgery.  Overall, his performance status is ECOG 2.    Medications:  Current outpatient prescriptions:  .  aspirin EC 81 MG tablet, Take 162 mg by mouth daily. , Disp: , Rfl:  .  Calcium Carb-Cholecalciferol (CALCIUM 600 + D PO), Take by mouth every morning., Disp: , Rfl:  .  Cyanocobalamin (VITAMIN B 12 PO), Take 1,000 mcg by mouth every morning. , Disp: , Rfl:  .  ixazomib citrate (NINLARO) 4 MG capsule, Take 4 mg by mouth See admin instructions. Take on an empty stomach 1hr before food  Day #1, then day # 8, then day # 15, then on day # 28 repeat., Disp: , Rfl:  .  Multiple Vitamins-Minerals (MULTIVITAL PO), Take 1 tablet by mouth every morning. , Disp: , Rfl:  .  pomalidomide (POMALYST) 2 MG capsule, Take 1 capsule (2 mg total) by mouth daily. Take with water on days 1-21. Repeat every 28 days.  Auth# [id number], Disp: 21 capsule, Rfl: 0 .  Pyridoxine HCl (VITAMIN B-6) 250 MG tablet, Take 500 mg by mouth daily., Disp: , Rfl:  .  Vitamin D, Ergocalciferol, (DRISDOL) 50000 UNITS CAPS capsule, Take 50,000 Units by mouth every 7 (seven) days. Sundays, Disp: , Rfl:   Allergies:  Allergies  Allergen Reactions  . Sulfa Antibiotics Itching    Past Medical History, Surgical history, Social history, and Family History were reviewed and updated.  Review of Systems: As above  Physical Exam:  height is 5\' 8"  (1.727 m) and weight is 153 lb (69.4 kg). His oral temperature is 97.5 F (36.4 C). His blood pressure is 144/75 and his pulse is 58. His respiration is 18.   Thin, elderly gentleman. He is no distress. Head and neck exam shows no ocular or oral lesions. He has no palpable cervical or supraclavicular lymph nodes. Lungs are clear bilaterally. Cardiac exam is regular rate and rhythm with no murmurs rubs or bruits. Abdomen is soft. He has good bowel sounds. There is no fluid wave. There is no palpable liver or spleen. Back exam no tenderness over the spine ribs or hips. He has some kyphosis. Extremities shows some age related changes. He has good range of motion and  strength. Skin exam shows no rashes. Neurological exam is nonfocal.  Lab Results  Component Value Date   WBC 5.2 12/30/2014   HGB 11.8* 12/30/2014   HCT 34.1* 12/30/2014   MCV 92 12/30/2014   PLT 198 12/30/2014     Chemistry      Component Value Date/Time   NA 142 12/30/2014 1103   NA 140 07/15/2014 1345   K 4.2 12/30/2014 1103   K 4.6 07/15/2014 1345   CL 105 12/30/2014 1103   CL 106 07/15/2014 1345   CO2 30 12/30/2014 1103   CO2 27 07/15/2014 1345   BUN 20 12/30/2014 1103   BUN 18 07/15/2014 1345   CREATININE 1.6* 12/30/2014 1103   CREATININE 1.41* 07/15/2014 1345      Component Value Date/Time   CALCIUM 9.0 12/30/2014 1103   CALCIUM 8.7 07/15/2014 1345   ALKPHOS 91* 12/30/2014 1103   ALKPHOS 101 07/15/2014  1345   AST 22 12/30/2014 1103   AST 18 07/15/2014 1345   ALT 17 12/30/2014 1103   ALT 15 07/15/2014 1345   BILITOT 1.60 12/30/2014 1103   BILITOT 1.3* 07/15/2014 1345       Impression and Plan: Christian Burns is 81 year old gentleman with IgG lambda myeloma. He's on Pomalidomide/Ninlaro. His last myeloma studies were pretty stable.  His creatinine is a little bit up today. As such, I will hold off on giving him Zometa.  I had a long talk with him. I told him about the myeloma and we do not know what the prognosis would be.   I think we by get him back in about 6 weeks.    [name], MD 5/20/201612:07 PM

## 2015-01-02 ENCOUNTER — Other Ambulatory Visit: Payer: Self-pay | Admitting: *Deleted

## 2015-01-02 DIAGNOSIS — C9000 Multiple myeloma not having achieved remission: Secondary | ICD-10-CM

## 2015-01-02 MED ORDER — POMALIDOMIDE 2 MG PO CAPS: 2.0000 mg | ORAL_CAPSULE | Freq: Every day | ORAL | Status: DC

## 2015-01-04 ENCOUNTER — Other Ambulatory Visit: Payer: Self-pay | Admitting: *Deleted

## 2015-01-04 DIAGNOSIS — C9000 Multiple myeloma not having achieved remission: Secondary | ICD-10-CM

## 2015-01-04 DIAGNOSIS — M25551 Pain in right hip: Secondary | ICD-10-CM

## 2015-01-04 LAB — KAPPA/LAMBDA LIGHT CHAINS
KAPPA LAMBDA RATIO: 0.3 (ref 0.26–1.65)
Kappa free light chain: 4.47 mg/dL — ABNORMAL HIGH (ref 0.33–1.94)
Lambda Free Lght Chn: 14.7 mg/dL — ABNORMAL HIGH (ref 0.57–2.63)

## 2015-01-04 LAB — PROTEIN ELECTROPHORESIS, SERUM, WITH REFLEX
ALPHA-2-GLOBULIN: 0.7 g/dL (ref 0.5–0.9)
Abnormal Protein Band1: 1 g/dL
Albumin ELP: 3.8 g/dL (ref 3.8–4.8)
Alpha-1-Globulin: 0.3 g/dL (ref 0.2–0.3)
Beta 2: 0.3 g/dL (ref 0.2–0.5)
Beta Globulin: 0.4 g/dL (ref 0.4–0.6)
Gamma Globulin: 1.7 g/dL (ref 0.8–1.7)
TOTAL PROTEIN, SERUM ELECTROPHOR: 7.1 g/dL (ref 6.1–8.1)

## 2015-01-04 LAB — IGG, IGA, IGM
IGA: 254 mg/dL (ref 68–379)
IGG (IMMUNOGLOBIN G), SERUM: 1990 mg/dL — AB (ref 650–1600)
IgM, Serum: 67 mg/dL (ref 41–251)

## 2015-01-04 LAB — IFE INTERPRETATION

## 2015-01-04 MED ORDER — IXAZOMIB CITRATE 4 MG PO CAPS: 4.0000 mg | ORAL_CAPSULE | ORAL | Status: DC

## 2015-01-05 ENCOUNTER — Telehealth: Payer: Self-pay | Admitting: *Deleted

## 2015-01-05 NOTE — Telephone Encounter (Addendum)
Patient aware of results.   ----- Message from [name], MD sent at 01/04/2015  5:19 PM EDT ----- Call - myeloma is stable.  pete

## 2015-01-27 ENCOUNTER — Ambulatory Visit: Payer: Medicare HMO

## 2015-01-27 ENCOUNTER — Ambulatory Visit: Payer: Medicare HMO | Admitting: Hematology & Oncology

## 2015-01-27 ENCOUNTER — Other Ambulatory Visit: Payer: Self-pay | Admitting: Nurse Practitioner

## 2015-01-27 ENCOUNTER — Other Ambulatory Visit: Payer: Medicare HMO

## 2015-01-27 DIAGNOSIS — C9000 Multiple myeloma not having achieved remission: Secondary | ICD-10-CM

## 2015-01-27 MED ORDER — POMALIDOMIDE 2 MG PO CAPS: 2.0000 mg | ORAL_CAPSULE | Freq: Every day | ORAL | Status: DC

## 2015-02-01 ENCOUNTER — Other Ambulatory Visit: Payer: Self-pay | Admitting: *Deleted

## 2015-02-09 ENCOUNTER — Other Ambulatory Visit (HOSPITAL_BASED_OUTPATIENT_CLINIC_OR_DEPARTMENT_OTHER): Payer: Medicare HMO

## 2015-02-09 ENCOUNTER — Ambulatory Visit (HOSPITAL_BASED_OUTPATIENT_CLINIC_OR_DEPARTMENT_OTHER): Payer: Medicare HMO | Admitting: Hematology & Oncology

## 2015-02-09 ENCOUNTER — Encounter: Payer: Self-pay | Admitting: Hematology & Oncology

## 2015-02-09 ENCOUNTER — Ambulatory Visit (HOSPITAL_BASED_OUTPATIENT_CLINIC_OR_DEPARTMENT_OTHER): Payer: Medicare HMO

## 2015-02-09 VITALS — BP 146/73 | HR 58 | Temp 97.6°F | Resp 18 | Ht 68.0 in | Wt 156.0 lb

## 2015-02-09 DIAGNOSIS — C9000 Multiple myeloma not having achieved remission: Secondary | ICD-10-CM

## 2015-02-09 LAB — CMP (CANCER CENTER ONLY)
ALBUMIN: 3.2 g/dL — AB (ref 3.3–5.5)
ALT: 22 U/L (ref 10–47)
AST: 24 U/L (ref 11–38)
Alkaline Phosphatase: 75 U/L (ref 26–84)
BILIRUBIN TOTAL: 1.4 mg/dL (ref 0.20–1.60)
BUN: 23 mg/dL — AB (ref 7–22)
CALCIUM: 8.9 mg/dL (ref 8.0–10.3)
CO2: 25 mEq/L (ref 18–33)
CREATININE: 1.3 mg/dL — AB (ref 0.6–1.2)
Chloride: 110 mEq/L — ABNORMAL HIGH (ref 98–108)
Glucose, Bld: 112 mg/dL (ref 73–118)
Potassium: 4.1 mEq/L (ref 3.3–4.7)
Sodium: 142 mEq/L (ref 128–145)
TOTAL PROTEIN: 7.1 g/dL (ref 6.4–8.1)

## 2015-02-09 LAB — CBC WITH DIFFERENTIAL (CANCER CENTER ONLY)
BASO#: 0.1 10*3/uL (ref 0.0–0.2)
BASO%: 2 % (ref 0.0–2.0)
EOS%: 3.4 % (ref 0.0–7.0)
Eosinophils Absolute: 0.1 10*3/uL (ref 0.0–0.5)
HCT: 33.3 % — ABNORMAL LOW (ref 38.7–49.9)
HGB: 11.6 g/dL — ABNORMAL LOW (ref 13.0–17.1)
LYMPH#: 0.6 10*3/uL — AB (ref 0.9–3.3)
LYMPH%: 16.6 % (ref 14.0–48.0)
MCH: 32 pg (ref 28.0–33.4)
MCHC: 34.8 g/dL (ref 32.0–35.9)
MCV: 92 fL (ref 82–98)
MONO#: 0.6 10*3/uL (ref 0.1–0.9)
MONO%: 17.4 % — ABNORMAL HIGH (ref 0.0–13.0)
NEUT%: 60.6 % (ref 40.0–80.0)
NEUTROS ABS: 2.1 10*3/uL (ref 1.5–6.5)
Platelets: 154 10*3/uL (ref 145–400)
RBC: 3.63 10*6/uL — ABNORMAL LOW (ref 4.20–5.70)
RDW: 14.7 % (ref 11.1–15.7)
WBC: 3.5 10*3/uL — ABNORMAL LOW (ref 4.0–10.0)

## 2015-02-09 MED ORDER — ZOLEDRONIC ACID 4 MG/5ML IV CONC
3.3000 mg | Freq: Once | INTRAVENOUS | Status: AC
  Administered 2015-02-09: 3.3 mg via INTRAVENOUS
  Filled 2015-02-09: qty 4.13

## 2015-02-09 NOTE — Patient Instructions (Signed)

## 2015-02-09 NOTE — Progress Notes (Signed)
Hematology and Oncology Follow Up Visit  Christian Burns [id number] 1929/11/06 81 y.o. 02/09/2015   Principle Diagnosis:   Recurrent IgG lambda myeloma - progressive  Current Therapy:    Pomalidomide 2 mg by mouth daily (21/7)/Ixazomib 4mg  po q week (3/1)  Zometa 3.3 mg IV q. 4 weeks     Interim History:  Mr.  Burns is back for followup.   He is feeling well. He is doing well with the Ninlaro. So far, he's had no problems with it. He has completed 6 cycles.   His myeloma studies have been holding pretty steady. His last M spike was 1.0 g/dL. His IgG level was 1990 mg/dL. His lambda light chain was 14.7 mg/dL.  His 24-hour urine that we did on him showed 897 mg of protein. Unfortunately it is hard to figure out how much light chain is in the urine now. I think that there is 68 mg.  He's not complaining of any lower back pain. He has some neuropathy in his feet. This might be a little bit more prominent. He is able to walk. He is not falling.    His appetite is good. Unfortunately, he and his wife cannot go out anymore because of her bad knees. She is not a candidate for any surgery. She just fell. Thankfully, she did not break anything.  Overall, his performance status is ECOG 2.    Medications:  Current outpatient prescriptions:  .  aspirin EC 81 MG tablet, Take 162 mg by mouth daily. , Disp: , Rfl:  .  Calcium Carb-Cholecalciferol (CALCIUM 600 + D PO), Take by mouth every morning., Disp: , Rfl:  .  Cyanocobalamin (VITAMIN B 12 PO), Take 1,000 mcg by mouth every morning. , Disp: , Rfl:  .  ixazomib citrate (NINLARO) 4 MG capsule, Take 1 capsule (4 mg total) by mouth See admin instructions. Take on empty stomach 1hr before food. Day #1, # 8, #15, then day # 28 repeat., Disp: 3 capsule, Rfl: 2 .  Multiple Vitamins-Minerals (MULTIVITAL PO), Take 1 tablet by mouth every morning. , Disp: , Rfl:  .  pomalidomide (POMALYST) 2 MG capsule, Take 1 capsule (2 mg total) by mouth daily. Take  with water on days 1-21. Repeat every 28 days. Auth# [id number], Disp: 21 capsule, Rfl: 0 .  Pyridoxine HCl (VITAMIN B-6) 250 MG tablet, Take 500 mg by mouth daily., Disp: , Rfl:  .  Vitamin D, Ergocalciferol, (DRISDOL) 50000 UNITS CAPS capsule, Take 50,000 Units by mouth every 7 (seven) days. Sundays, Disp: , Rfl:  No current facility-administered medications for this visit.  Facility-Administered Medications Ordered in Other Visits:  .  zolendronic acid (ZOMETA) 3.3 mg in sodium chloride 0.9 % 100 mL IVPB, 3.3 mg, Intravenous, Once, Azell Der, PA-C  Allergies:  Allergies  Allergen Reactions  . Sulfa Antibiotics Itching    Past Medical History, Surgical history, Social history, and Family History were reviewed and updated.  Review of Systems: As above  Physical Exam:  height is 5\' 8"  (1.727 m) and weight is 156 lb (70.761 kg). His oral temperature is 97.6 F (36.4 C). His blood pressure is 146/73 and his pulse is 58. His respiration is 18.   Thin, elderly gentleman. He is no distress. Head and neck exam shows no ocular or oral lesions. He has no palpable cervical or supraclavicular lymph nodes. Lungs are clear bilaterally. Cardiac exam is regular rate and rhythm with no murmurs rubs or bruits. Abdomen is soft. He  has good bowel sounds. There is no fluid wave. There is no palpable liver or spleen. Back exam no tenderness over the spine ribs or hips. He has some kyphosis. Extremities shows some age related changes. He has good range of motion and strength. Skin exam shows no rashes. Neurological exam is nonfocal.  Lab Results  Component Value Date   WBC 3.5* 02/09/2015   HGB 11.6* 02/09/2015   HCT 33.3* 02/09/2015   MCV 92 02/09/2015   PLT 154 02/09/2015     Chemistry      Component Value Date/Time   NA 142 02/09/2015 1155   NA 140 07/15/2014 1345   K 4.1 02/09/2015 1155   K 4.6 07/15/2014 1345   CL 110* 02/09/2015 1155   CL 106 07/15/2014 1345   CO2 25 02/09/2015 1155    CO2 27 07/15/2014 1345   BUN 23* 02/09/2015 1155   BUN 18 07/15/2014 1345   CREATININE 1.3* 02/09/2015 1155   CREATININE 1.41* 07/15/2014 1345      Component Value Date/Time   CALCIUM 8.9 02/09/2015 1155   CALCIUM 8.7 07/15/2014 1345   ALKPHOS 75 02/09/2015 1155   ALKPHOS 101 07/15/2014 1345   AST 24 02/09/2015 1155   AST 18 07/15/2014 1345   ALT 22 02/09/2015 1155   ALT 15 07/15/2014 1345   BILITOT 1.40 02/09/2015 1155   BILITOT 1.3* 07/15/2014 1345       Impression and Plan: Christian Burns is 81 year old gentleman with IgG lambda myeloma. He's on Pomalidomide/Ninlaro. His last myeloma studies were pretty stable.  His creatinine is is better. As such, we will go ahead and give him Zometa.  I had a long talk with him. I told him about the myeloma and we do not know what the prognosis would be.   I think we by get him back in about 6 weeks.    [name], MD 6/30/20161:24 PM

## 2015-02-14 LAB — SPEP & IFE WITH QIG
ABNORMAL PROTEIN BAND1: 1 g/dL
Albumin ELP: 3.7 g/dL — ABNORMAL LOW (ref 3.8–4.8)
Alpha-1-Globulin: 0.3 g/dL (ref 0.2–0.3)
Alpha-2-Globulin: 0.7 g/dL (ref 0.5–0.9)
Beta 2: 0.3 g/dL (ref 0.2–0.5)
Beta Globulin: 0.4 g/dL (ref 0.4–0.6)
GAMMA GLOBULIN: 1.7 g/dL (ref 0.8–1.7)
IGA: 198 mg/dL (ref 68–379)
IGG (IMMUNOGLOBIN G), SERUM: 1690 mg/dL — AB (ref 650–1600)
IgM, Serum: 48 mg/dL (ref 41–251)
TOTAL PROTEIN, SERUM ELECTROPHOR: 7 g/dL (ref 6.1–8.1)

## 2015-02-14 LAB — KAPPA/LAMBDA LIGHT CHAINS
KAPPA FREE LGHT CHN: 3 mg/dL — AB (ref 0.33–1.94)
Kappa:Lambda Ratio: 0.19 — ABNORMAL LOW (ref 0.26–1.65)
Lambda Free Lght Chn: 15.9 mg/dL — ABNORMAL HIGH (ref 0.57–2.63)

## 2015-02-15 ENCOUNTER — Telehealth: Payer: Self-pay | Admitting: Hematology & Oncology

## 2015-02-15 ENCOUNTER — Telehealth: Payer: Self-pay | Admitting: *Deleted

## 2015-02-15 NOTE — Telephone Encounter (Signed)
-----   Message from [name], MD sent at 02/15/2015  6:57 AM EDT ----- Call - myeloma is stable. This is good!!!  [name]

## 2015-02-15 NOTE — Telephone Encounter (Signed)
Faxed medical records to:   [facility] Attn: Chart Retrieval Outreach ID: [id number] Site ID: [phone] [address]  [address] F: [phone] P: [phone] Sidonie Dickens   Chart: [id number]

## 2015-02-15 NOTE — Telephone Encounter (Addendum)
 -----   Message from [name], MD sent at 02/15/2015  6:57 AM EDT ----- Call - myeloma is stable. This is good!!!  [name]

## 2015-02-18 LAB — UIFE/LIGHT CHAINS/TP QN, 24-HR UR
ALPHA 1 UR: DETECTED — AB
Albumin, U: DETECTED
Alpha 2, Urine: DETECTED — AB
Beta, Urine: DETECTED — AB
GAMMA UR: DETECTED — AB
TOTAL PROTEIN, URINE-UR/DAY: 544 mg/d — AB (ref <150)
Time: 24 hours
Total Protein, Urine: 34 mg/dL — ABNORMAL HIGH (ref 5–25)
Volume, Urine: 1600 mL

## 2015-02-18 LAB — 24 HR URINE,KAPPA/LAMBDA LIGHT CHAINS
MEASURED KAPPA CHAIN: 1.92 mg/dL (ref <2.00)
MEASURED LAMBDA CHAIN: 2.56 mg/dL — AB (ref <2.00)
TOTAL LAMBDA CHAIN: 40.96 mg/(24.h)
Total Kappa Chain: 30.72 mg/24 h
URINE VOLUME: 1600 mL/(24.h)

## 2015-02-20 ENCOUNTER — Telehealth: Payer: Self-pay | Admitting: *Deleted

## 2015-02-20 NOTE — Telephone Encounter (Signed)
-----   Message from [name], MD sent at 02/16/2015  2:50 PM EDT ----- Call - urine test shows a less amount of myeloma protein!!!  pete

## 2015-03-03 ENCOUNTER — Other Ambulatory Visit: Payer: Self-pay | Admitting: *Deleted

## 2015-03-03 DIAGNOSIS — C9000 Multiple myeloma not having achieved remission: Secondary | ICD-10-CM

## 2015-03-03 MED ORDER — POMALIDOMIDE 2 MG PO CAPS: 2.0000 mg | ORAL_CAPSULE | Freq: Every day | ORAL | Status: DC

## 2015-03-07 ENCOUNTER — Telehealth: Payer: Self-pay | Admitting: Hematology & Oncology

## 2015-03-07 NOTE — Telephone Encounter (Signed)
Faxed medical records to [facility] on behalf of [facility].  Outreach ID: [id number] Site ID: [id number]  Chart ID: [id number]  P: [phone] Sidonie Dickens F: [phone]     COPY SCANNED

## 2015-03-24 ENCOUNTER — Ambulatory Visit (HOSPITAL_BASED_OUTPATIENT_CLINIC_OR_DEPARTMENT_OTHER): Payer: Medicare HMO

## 2015-03-24 ENCOUNTER — Ambulatory Visit (HOSPITAL_BASED_OUTPATIENT_CLINIC_OR_DEPARTMENT_OTHER): Payer: Medicare HMO | Admitting: Hematology & Oncology

## 2015-03-24 ENCOUNTER — Encounter: Payer: Self-pay | Admitting: Hematology & Oncology

## 2015-03-24 VITALS — BP 153/79 | HR 63 | Temp 97.4°F | Resp 16 | Ht 68.0 in | Wt 154.0 lb

## 2015-03-24 DIAGNOSIS — C9000 Multiple myeloma not having achieved remission: Secondary | ICD-10-CM

## 2015-03-24 LAB — CMP (CANCER CENTER ONLY)
ALT: 18 U/L (ref 10–47)
AST: 25 U/L (ref 11–38)
Albumin: 3.3 g/dL (ref 3.3–5.5)
Alkaline Phosphatase: 74 U/L (ref 26–84)
BILIRUBIN TOTAL: 1.6 mg/dL (ref 0.20–1.60)
BUN, Bld: 20 mg/dL (ref 7–22)
CALCIUM: 9 mg/dL (ref 8.0–10.3)
CO2: 26 mEq/L (ref 18–33)
Chloride: 107 mEq/L (ref 98–108)
Creat: 1.5 mg/dl — ABNORMAL HIGH (ref 0.6–1.2)
GLUCOSE: 119 mg/dL — AB (ref 73–118)
Potassium: 4.4 mEq/L (ref 3.3–4.7)
SODIUM: 142 meq/L (ref 128–145)
TOTAL PROTEIN: 7.7 g/dL (ref 6.4–8.1)

## 2015-03-24 LAB — CBC WITH DIFFERENTIAL (CANCER CENTER ONLY)
BASO#: 0.1 10*3/uL (ref 0.0–0.2)
BASO%: 2 % (ref 0.0–2.0)
EOS%: 5.3 % (ref 0.0–7.0)
Eosinophils Absolute: 0.2 10*3/uL (ref 0.0–0.5)
HCT: 36 % — ABNORMAL LOW (ref 38.7–49.9)
HGB: 12.5 g/dL — ABNORMAL LOW (ref 13.0–17.1)
LYMPH#: 0.5 10*3/uL — AB (ref 0.9–3.3)
LYMPH%: 10.3 % — ABNORMAL LOW (ref 14.0–48.0)
MCH: 32.1 pg (ref 28.0–33.4)
MCHC: 34.7 g/dL (ref 32.0–35.9)
MCV: 92 fL (ref 82–98)
MONO#: 0.8 10*3/uL (ref 0.1–0.9)
MONO%: 18 % — AB (ref 0.0–13.0)
NEUT#: 2.9 10*3/uL (ref 1.5–6.5)
NEUT%: 64.4 % (ref 40.0–80.0)
PLATELETS: 180 10*3/uL (ref 145–400)
RBC: 3.9 10*6/uL — AB (ref 4.20–5.70)
RDW: 14.3 % (ref 11.1–15.7)
WBC: 4.6 10*3/uL (ref 4.0–10.0)

## 2015-03-24 MED ORDER — SODIUM CHLORIDE 0.9 % IJ SOLN
3.0000 mL | Freq: Once | INTRAMUSCULAR | Status: DC | PRN
  Filled 2015-03-24: qty 10

## 2015-03-24 MED ORDER — SODIUM CHLORIDE 0.9 % IV SOLN
Freq: Once | INTRAVENOUS | Status: AC
Start: 2015-03-24 — End: 2015-03-24
  Administered 2015-03-24: 12:00:00 via INTRAVENOUS

## 2015-03-24 MED ORDER — SODIUM CHLORIDE 0.9 % IJ SOLN
10.0000 mL | INTRAMUSCULAR | Status: DC | PRN
  Filled 2015-03-24: qty 10

## 2015-03-24 MED ORDER — SODIUM CHLORIDE 0.9 % IV SOLN
3.3000 mg | Freq: Once | INTRAVENOUS | Status: AC
  Administered 2015-03-24: 3.3 mg via INTRAVENOUS
  Filled 2015-03-24: qty 4.13

## 2015-03-24 MED ORDER — HEPARIN SOD (PORK) LOCK FLUSH 100 UNIT/ML IV SOLN
250.0000 [IU] | Freq: Once | INTRAVENOUS | Status: DC | PRN
  Filled 2015-03-24: qty 5

## 2015-03-24 MED ORDER — HEPARIN SOD (PORK) LOCK FLUSH 100 UNIT/ML IV SOLN
500.0000 [IU] | Freq: Once | INTRAVENOUS | Status: DC | PRN
  Filled 2015-03-24: qty 5

## 2015-03-24 MED ORDER — ALTEPLASE 2 MG IJ SOLR
2.0000 mg | Freq: Once | INTRAMUSCULAR | Status: DC | PRN
  Filled 2015-03-24: qty 2

## 2015-03-24 NOTE — Patient Instructions (Signed)

## 2015-03-24 NOTE — Progress Notes (Signed)
Hematology and Oncology Follow Up Visit  Christian Burns [id number] 09-27-1929 81 y.o. 03/24/2015   Principle Diagnosis:   Recurrent IgG lambda myeloma - progressive  Current Therapy:    Pomalidomide 2 mg by mouth daily (21/7)/Ixazomib 4mg  po q week (3/1)  Zometa 3.3 mg IV q. 4 weeks     Interim History:  Mr.  Burns is back for followup.   He is feeling well. He is doing well with the Ninlaro. So far, he's had no problems with it. He has completed 7 cycles.   His myeloma studies have been holding pretty steady. His last M spike was 1.0 g/dL. His IgG level was 1690 mg/dL. His lambda light chain was 15.9 mg/dL.  His 24-hour urine that we did on him showed 544 mg of protein. Unfortunately it is hard to figure out how much light chain is in the urine now. I think that there is 31 mg.  He's not complaining of any lower back pain. He has some neuropathy in his feet. This might be a little bit more prominent. He is able to walk. He is not falling.    His appetite is good. Unfortunately, he and his wife cannot go out anymore because of her bad knees. She is not a candidate for any surgery. She just fell. Thankfully, she did not break anything.  Overall, his performance status is ECOG 2.    Medications:  Current outpatient prescriptions:  .  aspirin EC 81 MG tablet, Take 162 mg by mouth daily. , Disp: , Rfl:  .  Calcium Carb-Cholecalciferol (CALCIUM 600 + D PO), Take by mouth every morning., Disp: , Rfl:  .  Cyanocobalamin (VITAMIN B 12 PO), Take 1,000 mcg by mouth every morning. , Disp: , Rfl:  .  ixazomib citrate (NINLARO) 4 MG capsule, Take 1 capsule (4 mg total) by mouth See admin instructions. Take on empty stomach 1hr before food. Day #1, # 8, #15, then day # 28 repeat., Disp: 3 capsule, Rfl: 2 .  Multiple Vitamins-Minerals (MULTIVITAL PO), Take 1 tablet by mouth every morning. , Disp: , Rfl:  .  pomalidomide (POMALYST) 2 MG capsule, Take 1 capsule (2 mg total) by mouth daily. Take  with water on days 1-21. Repeat every 28 days. Auth# [id number], Disp: 21 capsule, Rfl: 0 .  Pyridoxine HCl (VITAMIN B-6) 250 MG tablet, Take 500 mg by mouth daily., Disp: , Rfl:  .  Vitamin D, Ergocalciferol, (DRISDOL) 50000 UNITS CAPS capsule, Take 50,000 Units by mouth every 7 (seven) days. Sundays, Disp: , Rfl:   Allergies:  Allergies  Allergen Reactions  . Sulfa Antibiotics Itching    Past Medical History, Surgical history, Social history, and Family History were reviewed and updated.  Review of Systems: As above  Physical Exam:  height is 5\' 8"  (1.727 m) and weight is 154 lb (69.854 kg). His oral temperature is 97.4 F (36.3 C). His blood pressure is 153/79 and his pulse is 63. His respiration is 16.   Thin, elderly gentleman. He is no distress. Head and neck exam shows no ocular or oral lesions. He has no palpable cervical or supraclavicular lymph nodes. Lungs are clear bilaterally. Cardiac exam is regular rate and rhythm with no murmurs rubs or bruits. Abdomen is soft. He has good bowel sounds. There is no fluid wave. There is no palpable liver or spleen. Back exam no tenderness over the spine ribs or hips. He has some kyphosis. Extremities shows some age related changes. He has  good range of motion and strength. Skin exam shows no rashes. Neurological exam is nonfocal.  Lab Results  Component Value Date   WBC 4.6 03/24/2015   HGB 12.5* 03/24/2015   HCT 36.0* 03/24/2015   MCV 92 03/24/2015   PLT 180 03/24/2015     Chemistry      Component Value Date/Time   NA 142 03/24/2015 1026   NA 140 07/15/2014 1345   K 4.4 03/24/2015 1026   K 4.6 07/15/2014 1345   CL 107 03/24/2015 1026   CL 106 07/15/2014 1345   CO2 26 03/24/2015 1026   CO2 27 07/15/2014 1345   BUN 20 03/24/2015 1026   BUN 18 07/15/2014 1345   CREATININE 1.5* 03/24/2015 1026   CREATININE 1.41* 07/15/2014 1345      Component Value Date/Time   CALCIUM 9.0 03/24/2015 1026   CALCIUM 8.7 07/15/2014 1345   ALKPHOS  74 03/24/2015 1026   ALKPHOS 101 07/15/2014 1345   AST 25 03/24/2015 1026   AST 18 07/15/2014 1345   ALT 18 03/24/2015 1026   ALT 15 07/15/2014 1345   BILITOT 1.60 03/24/2015 1026   BILITOT 1.3* 07/15/2014 1345       Impression and Plan: Christian Burns is 82 year old gentleman with IgG lambda myeloma. He's on Pomalidomide/Ninlaro. His last myeloma studies were pretty stable.  His creatinine is is better. As such, we will go ahead and give him Zometa.  I had a long talk with him. I told him about the myeloma and we do not know what the prognosis would be.   I think we by get him back in about 6 weeks.    [name], MD 8/12/201611:38 AM

## 2015-03-28 ENCOUNTER — Other Ambulatory Visit: Payer: Self-pay | Admitting: Nurse Practitioner

## 2015-03-28 DIAGNOSIS — C9000 Multiple myeloma not having achieved remission: Secondary | ICD-10-CM

## 2015-03-28 MED ORDER — POMALIDOMIDE 2 MG PO CAPS: 2.0000 mg | ORAL_CAPSULE | Freq: Every day | ORAL | Status: DC

## 2015-03-29 ENCOUNTER — Other Ambulatory Visit: Payer: Self-pay | Admitting: *Deleted

## 2015-03-29 DIAGNOSIS — C9000 Multiple myeloma not having achieved remission: Secondary | ICD-10-CM

## 2015-03-29 DIAGNOSIS — M25551 Pain in right hip: Secondary | ICD-10-CM

## 2015-03-29 LAB — IGG, IGA, IGM
IGA: 231 mg/dL (ref 68–379)
IGM, SERUM: 58 mg/dL (ref 41–251)
IgG (Immunoglobin G), Serum: 1740 mg/dL — ABNORMAL HIGH (ref 650–1600)

## 2015-03-29 LAB — LACTATE DEHYDROGENASE: LDH: 145 U/L (ref 94–250)

## 2015-03-29 LAB — PROTEIN ELECTROPHORESIS, SERUM, WITH REFLEX
ALBUMIN ELP: 3.7 g/dL — AB (ref 3.8–4.8)
ALPHA-1-GLOBULIN: 0.3 g/dL (ref 0.2–0.3)
Abnormal Protein Band1: 1.2 g/dL
Alpha-2-Globulin: 0.7 g/dL (ref 0.5–0.9)
Beta 2: 0.3 g/dL (ref 0.2–0.5)
Beta Globulin: 0.4 g/dL (ref 0.4–0.6)
Gamma Globulin: 1.9 g/dL — ABNORMAL HIGH (ref 0.8–1.7)
Total Protein, Serum Electrophoresis: 7.2 g/dL (ref 6.1–8.1)

## 2015-03-29 LAB — KAPPA/LAMBDA LIGHT CHAINS
Kappa free light chain: 4.16 mg/dL — ABNORMAL HIGH (ref 0.33–1.94)
Kappa:Lambda Ratio: 0.23 — ABNORMAL LOW (ref 0.26–1.65)
Lambda Free Lght Chn: 18 mg/dL — ABNORMAL HIGH (ref 0.57–2.63)

## 2015-03-29 LAB — IFE INTERPRETATION

## 2015-03-29 MED ORDER — IXAZOMIB CITRATE 4 MG PO CAPS: 4.0000 mg | ORAL_CAPSULE | ORAL | Status: DC

## 2015-03-30 ENCOUNTER — Telehealth: Payer: Self-pay | Admitting: *Deleted

## 2015-03-30 NOTE — Telephone Encounter (Signed)
-----   Message from [name], MD sent at 03/29/2015  5:19 PM EDT ----- Please call and tell him that the myeloma levels are still very stable. Everything looks pretty much the same. I would not make any changes with his medications right now. Thanks

## 2015-03-31 LAB — 24 HR URINE,KAPPA/LAMBDA LIGHT CHAINS
MEASURED LAMBDA CHAIN: 3.88 mg/dL — AB (ref <2.00)
Measured Kappa Chain: 3.65 mg/dL — ABNORMAL HIGH (ref <2.00)
TOTAL LAMBDA CHAIN: 34.92 mg/(24.h)
Total Kappa Chain: 32.85 mg/24 h
URINE VOLUME: 900 mL/(24.h)

## 2015-03-31 LAB — UIFE/LIGHT CHAINS/TP QN, 24-HR UR
ALPHA 1 UR: DETECTED — AB
ALPHA 2 UR: DETECTED — AB
Albumin, U: DETECTED
BETA UR: DETECTED — AB
Gamma Globulin, Urine: DETECTED — AB
TIME-UPE24: 24 h
Total Protein, Urine: 47 mg/dL — ABNORMAL HIGH (ref 5–25)
Volume, Urine: 900 mL

## 2015-04-19 ENCOUNTER — Other Ambulatory Visit: Payer: Self-pay | Admitting: *Deleted

## 2015-04-19 DIAGNOSIS — C9000 Multiple myeloma not having achieved remission: Secondary | ICD-10-CM

## 2015-04-19 MED ORDER — POMALIDOMIDE 2 MG PO CAPS: 2.0000 mg | ORAL_CAPSULE | Freq: Every day | ORAL | Status: DC

## 2015-05-05 ENCOUNTER — Ambulatory Visit (HOSPITAL_BASED_OUTPATIENT_CLINIC_OR_DEPARTMENT_OTHER): Payer: Medicare HMO

## 2015-05-05 ENCOUNTER — Encounter: Payer: Self-pay | Admitting: Hematology & Oncology

## 2015-05-05 ENCOUNTER — Ambulatory Visit (HOSPITAL_BASED_OUTPATIENT_CLINIC_OR_DEPARTMENT_OTHER): Payer: Medicare HMO | Admitting: Hematology & Oncology

## 2015-05-05 ENCOUNTER — Ambulatory Visit: Payer: Medicare HMO

## 2015-05-05 VITALS — BP 143/77 | HR 66 | Temp 97.5°F | Resp 16 | Ht 68.0 in | Wt 155.0 lb

## 2015-05-05 DIAGNOSIS — C9000 Multiple myeloma not having achieved remission: Secondary | ICD-10-CM

## 2015-05-05 LAB — CMP (CANCER CENTER ONLY)
ALK PHOS: 71 U/L (ref 26–84)
ALT: 23 U/L (ref 10–47)
AST: 27 U/L (ref 11–38)
Albumin: 3.2 g/dL — ABNORMAL LOW (ref 3.3–5.5)
BUN, Bld: 23 mg/dL — ABNORMAL HIGH (ref 7–22)
CALCIUM: 9 mg/dL (ref 8.0–10.3)
CO2: 27 mEq/L (ref 18–33)
Chloride: 104 mEq/L (ref 98–108)
Creat: 1.6 mg/dl — ABNORMAL HIGH (ref 0.6–1.2)
Glucose, Bld: 98 mg/dL (ref 73–118)
POTASSIUM: 4.2 meq/L (ref 3.3–4.7)
Sodium: 138 mEq/L (ref 128–145)
TOTAL PROTEIN: 7.7 g/dL (ref 6.4–8.1)
Total Bilirubin: 1.2 mg/dl (ref 0.20–1.60)

## 2015-05-05 LAB — CBC WITH DIFFERENTIAL (CANCER CENTER ONLY)
BASO#: 0.1 10*3/uL (ref 0.0–0.2)
BASO%: 2 % (ref 0.0–2.0)
EOS ABS: 0.1 10*3/uL (ref 0.0–0.5)
EOS%: 2.8 % (ref 0.0–7.0)
HEMATOCRIT: 33.1 % — AB (ref 38.7–49.9)
HGB: 11.3 g/dL — ABNORMAL LOW (ref 13.0–17.1)
LYMPH#: 0.6 10*3/uL — AB (ref 0.9–3.3)
LYMPH%: 17.5 % (ref 14.0–48.0)
MCH: 31.2 pg (ref 28.0–33.4)
MCHC: 34.1 g/dL (ref 32.0–35.9)
MCV: 91 fL (ref 82–98)
MONO#: 0.6 10*3/uL (ref 0.1–0.9)
MONO%: 16.9 % — ABNORMAL HIGH (ref 0.0–13.0)
NEUT#: 2.2 10*3/uL (ref 1.5–6.5)
NEUT%: 60.8 % (ref 40.0–80.0)
Platelets: 124 10*3/uL — ABNORMAL LOW (ref 145–400)
RBC: 3.62 10*6/uL — AB (ref 4.20–5.70)
RDW: 14.4 % (ref 11.1–15.7)
WBC: 3.5 10*3/uL — AB (ref 4.0–10.0)

## 2015-05-05 NOTE — Progress Notes (Signed)
Hematology and Oncology Follow Up Visit  Christian Burns [id number] [birth year] 81 y.o. 05/05/2015   Principle Diagnosis:   Recurrent IgG lambda myeloma - progressive  Current Therapy:    Pomalidomide 2 mg by mouth daily (21/7)/Ixazomib 4mg  po q week (3/1)  Zometa 3.3 mg IV q. 4 weeks     Interim History:  Mr.  Burns is back for followup.   He is feeling well. He is doing well with the Ninlaro/Pomalidomide. So far, he's had no problems with it. He has completed 9 cycles.   His myeloma studies have been holding pretty steady. His last M spike was 1.0 g/dL. His IgG level was1740 mg/dL. His lambda light chain was 18 mg/dL.  He is not complaining of any back pain. He does have some neuropathy in his feet, mostly on his right side. 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  His wife is not doing too well. He had to take her to the emergency room a couple times because of inability to walk. She really has bad arthritis.  He is very thankful that he can take treatment at home with pills and not have to keep coming out of the office for injections or infusions.   Overall, his performance status is ECOG 2.    Medications:  Current outpatient prescriptions:  .  aspirin EC 81 MG tablet, Take 81 mg by mouth daily. , Disp: , Rfl:  .  Calcium Carb-Cholecalciferol (CALCIUM 600 + D PO), Take by mouth every morning., Disp: , Rfl:  .  Cyanocobalamin (VITAMIN B 12 PO), Take 1,000 mcg by mouth every morning. , Disp: , Rfl:  .  ixazomib citrate (NINLARO) 4 MG capsule, Take 1 capsule (4 mg total) by mouth See admin instructions. Take on empty stomach 1hr before food. Day #1, # 8, #15, then day # 28 repeat., Disp: 3 capsule, Rfl: 2 .  Multiple Vitamins-Minerals (MULTIVITAL PO), Take 1 tablet by mouth every morning. , Disp: , Rfl:  .  pomalidomide (POMALYST) 2 MG capsule, Take 1 capsule (2 mg total) by mouth daily. Take with water on days 1-21. Repeat every 28 days. Auth# [id number],  Disp: 21 capsule, Rfl: 0 .  Pyridoxine HCl (VITAMIN B-6) 250 MG tablet, Take 500 mg by mouth daily., Disp: , Rfl:  .  Vitamin D, Ergocalciferol, (DRISDOL) 50000 UNITS CAPS capsule, Take 50,000 Units by mouth every 7 (seven) days. Sundays, Disp: , Rfl:   Allergies:  Allergies  Allergen Reactions  . Sulfa Antibiotics Itching    Past Medical History, Surgical history, Social history, and Family History were reviewed and updated.  Review of Systems: As above  Physical Exam:  height is 5\' 8"  (1.727 m) and weight is 155 lb (70.308 kg). His oral temperature is 97.5 F (36.4 C). His blood pressure is 143/77 and his pulse is 66. His respiration is 16.   Thin, elderly gentleman. He is no distress. Head and neck exam shows no ocular or oral lesions. He has no palpable cervical or supraclavicular lymph nodes. Lungs are clear bilaterally. Cardiac exam is regular rate and rhythm with no murmurs rubs or bruits. Abdomen is soft. He has good bowel sounds. There is no fluid wave. There is no palpable liver or spleen. Back exam no tenderness over the spine ribs or hips. He has some kyphosis. Extremities shows some age related changes. He has good range of motion and strength. Skin exam shows no rashes. Neurological exam is nonfocal.  Lab Results  Component Value Date   WBC 3.5* 05/05/2015   HGB 11.3* 05/05/2015   HCT 33.1* 05/05/2015   MCV 91 05/05/2015   PLT 124* 05/05/2015     Chemistry      Component Value Date/Time   NA 138 05/05/2015 1317   NA 140 07/15/2014 1345   K 4.2 05/05/2015 1317   K 4.6 07/15/2014 1345   CL 104 05/05/2015 1317   CL 106 07/15/2014 1345   CO2 27 05/05/2015 1317   CO2 27 07/15/2014 1345   BUN 23* 05/05/2015 1317   BUN 18 07/15/2014 1345   CREATININE 1.6* 05/05/2015 1317   CREATININE 1.41* 07/15/2014 1345      Component Value Date/Time   CALCIUM 9.0 05/05/2015 1317   CALCIUM 8.7 07/15/2014 1345   ALKPHOS 71 05/05/2015 1317   ALKPHOS 101 07/15/2014 1345   AST  27 05/05/2015 1317   AST 18 07/15/2014 1345   ALT 23 05/05/2015 1317   ALT 15 07/15/2014 1345   BILITOT 1.20 05/05/2015 1317   BILITOT 1.3* 07/15/2014 1345       Impression and Plan: Christian Burns is 81 year old gentleman with IgG lambda myeloma. He's on Pomalidomide/Ninlaro. His last myeloma studies were pretty stable.  His creatinine is  a little up today. As such, I think we probably need to hold on the Zometa.  Overall, his quality of life is really doing well. I just feel bad that his poor wife is not doing as well.   think we by get him back in about 6 weeks.    [name], MD 9/23/20162:30 PM

## 2015-05-09 LAB — IGG, IGA, IGM
IGG (IMMUNOGLOBIN G), SERUM: 2250 mg/dL — AB (ref 650–1600)
IgA: 228 mg/dL (ref 68–379)
IgM, Serum: 57 mg/dL (ref 41–251)

## 2015-05-09 LAB — PROTEIN ELECTROPHORESIS, SERUM, WITH REFLEX
ALBUMIN ELP: 3.6 g/dL — AB (ref 3.8–4.8)
ALPHA-1-GLOBULIN: 0.3 g/dL (ref 0.2–0.3)
ALPHA-2-GLOBULIN: 0.7 g/dL (ref 0.5–0.9)
Abnormal Protein Band1: 1.4 g/dL
BETA 2: 0.3 g/dL (ref 0.2–0.5)
BETA GLOBULIN: 0.4 g/dL (ref 0.4–0.6)
Gamma Globulin: 2.1 g/dL — ABNORMAL HIGH (ref 0.8–1.7)
Total Protein, Serum Electrophoresis: 7.4 g/dL (ref 6.1–8.1)

## 2015-05-09 LAB — KAPPA/LAMBDA LIGHT CHAINS
Kappa free light chain: 3.45 mg/dL — ABNORMAL HIGH (ref 0.33–1.94)
Kappa:Lambda Ratio: 0.18 — ABNORMAL LOW (ref 0.26–1.65)
Lambda Free Lght Chn: 18.9 mg/dL — ABNORMAL HIGH (ref 0.57–2.63)

## 2015-05-09 LAB — IFE INTERPRETATION

## 2015-05-11 NOTE — Progress Notes (Signed)
Received shipment confirmation from [employer] of pt's Ninlaro. dph

## 2015-05-19 ENCOUNTER — Other Ambulatory Visit: Payer: Self-pay | Admitting: Nurse Practitioner

## 2015-05-19 DIAGNOSIS — C9000 Multiple myeloma not having achieved remission: Secondary | ICD-10-CM

## 2015-05-19 MED ORDER — POMALIDOMIDE 2 MG PO CAPS: 2.0000 mg | ORAL_CAPSULE | Freq: Every day | ORAL | Status: DC

## 2015-06-20 ENCOUNTER — Ambulatory Visit (HOSPITAL_BASED_OUTPATIENT_CLINIC_OR_DEPARTMENT_OTHER): Payer: Medicare HMO | Admitting: Hematology & Oncology

## 2015-06-20 ENCOUNTER — Ambulatory Visit (HOSPITAL_BASED_OUTPATIENT_CLINIC_OR_DEPARTMENT_OTHER): Payer: Medicare HMO

## 2015-06-20 ENCOUNTER — Encounter: Payer: Self-pay | Admitting: Hematology & Oncology

## 2015-06-20 VITALS — BP 155/72 | HR 63 | Temp 97.5°F | Resp 14 | Ht 68.0 in | Wt 155.0 lb

## 2015-06-20 DIAGNOSIS — C9000 Multiple myeloma not having achieved remission: Secondary | ICD-10-CM | POA: Diagnosis not present

## 2015-06-20 DIAGNOSIS — C9002 Multiple myeloma in relapse: Secondary | ICD-10-CM

## 2015-06-20 LAB — CBC WITH DIFFERENTIAL (CANCER CENTER ONLY)
BASO#: 0.1 10*3/uL (ref 0.0–0.2)
BASO%: 3.2 % — ABNORMAL HIGH (ref 0.0–2.0)
EOS%: 5.7 % (ref 0.0–7.0)
Eosinophils Absolute: 0.2 10*3/uL (ref 0.0–0.5)
HCT: 34.5 % — ABNORMAL LOW (ref 38.7–49.9)
HGB: 11.6 g/dL — ABNORMAL LOW (ref 13.0–17.1)
LYMPH#: 0.4 10*3/uL — ABNORMAL LOW (ref 0.9–3.3)
LYMPH%: 14.2 % (ref 14.0–48.0)
MCH: 31.1 pg (ref 28.0–33.4)
MCHC: 33.6 g/dL (ref 32.0–35.9)
MCV: 93 fL (ref 82–98)
MONO#: 0.9 10*3/uL (ref 0.1–0.9)
MONO%: 33.5 % — AB (ref 0.0–13.0)
NEUT#: 1.2 10*3/uL — ABNORMAL LOW (ref 1.5–6.5)
NEUT%: 43.4 % (ref 40.0–80.0)
Platelets: 119 10*3/uL — ABNORMAL LOW (ref 145–400)
RBC: 3.73 10*6/uL — ABNORMAL LOW (ref 4.20–5.70)
RDW: 14.9 % (ref 11.1–15.7)
WBC: 2.8 10*3/uL — ABNORMAL LOW (ref 4.0–10.0)

## 2015-06-20 LAB — CMP (CANCER CENTER ONLY)
ALT(SGPT): 25 U/L (ref 10–47)
AST: 30 U/L (ref 11–38)
Albumin: 3.1 g/dL — ABNORMAL LOW (ref 3.3–5.5)
Alkaline Phosphatase: 78 U/L (ref 26–84)
BILIRUBIN TOTAL: 1.1 mg/dL (ref 0.20–1.60)
BUN, Bld: 23 mg/dL — ABNORMAL HIGH (ref 7–22)
CALCIUM: 9 mg/dL (ref 8.0–10.3)
CO2: 25 meq/L (ref 18–33)
CREATININE: 1.4 mg/dL — AB (ref 0.6–1.2)
Chloride: 104 mEq/L (ref 98–108)
GLUCOSE: 100 mg/dL (ref 73–118)
Potassium: 4.1 mEq/L (ref 3.3–4.7)
SODIUM: 141 meq/L (ref 128–145)
Total Protein: 8 g/dL (ref 6.4–8.1)

## 2015-06-20 MED ORDER — ZOLEDRONIC ACID 4 MG/5ML IV CONC
3.3000 mg | Freq: Once | INTRAVENOUS | Status: AC
  Administered 2015-06-20: 3.3 mg via INTRAVENOUS
  Filled 2015-06-20: qty 4.13

## 2015-06-20 NOTE — Patient Instructions (Signed)

## 2015-06-22 LAB — PROTEIN ELECTROPHORESIS, SERUM, WITH REFLEX
ABNORMAL PROTEIN BAND1: 1.5 g/dL
ALPHA-2-GLOBULIN: 0.7 g/dL (ref 0.5–0.9)
Albumin ELP: 3.7 g/dL — ABNORMAL LOW (ref 3.8–4.8)
Alpha-1-Globulin: 0.3 g/dL (ref 0.2–0.3)
Beta 2: 0.3 g/dL (ref 0.2–0.5)
Beta Globulin: 0.4 g/dL (ref 0.4–0.6)
GAMMA GLOBULIN: 2.2 g/dL — AB (ref 0.8–1.7)
TOTAL PROTEIN, SERUM ELECTROPHOR: 7.5 g/dL (ref 6.1–8.1)

## 2015-06-22 LAB — KAPPA/LAMBDA LIGHT CHAINS
KAPPA LAMBDA RATIO: 0.14 — AB (ref 0.26–1.65)
Kappa free light chain: 4 mg/dL — ABNORMAL HIGH (ref 0.33–1.94)
LAMBDA FREE LGHT CHN: 28.8 mg/dL — AB (ref 0.57–2.63)

## 2015-06-22 LAB — IFE INTERPRETATION

## 2015-06-22 LAB — IGG, IGA, IGM
IgA: 220 mg/dL (ref 68–379)
IgG (Immunoglobin G), Serum: 2140 mg/dL — ABNORMAL HIGH (ref 650–1600)
IgM, Serum: 55 mg/dL (ref 41–251)

## 2015-06-23 ENCOUNTER — Telehealth: Payer: Self-pay | Admitting: *Deleted

## 2015-06-23 ENCOUNTER — Other Ambulatory Visit: Payer: Self-pay | Admitting: *Deleted

## 2015-06-23 DIAGNOSIS — C9000 Multiple myeloma not having achieved remission: Secondary | ICD-10-CM

## 2015-06-23 MED ORDER — POMALIDOMIDE 2 MG PO CAPS: 2.0000 mg | ORAL_CAPSULE | Freq: Every day | ORAL | Status: DC

## 2015-06-23 NOTE — Telephone Encounter (Addendum)
Patient aware of results.   ----- Message from [name], MD sent at 06/22/2015  4:09 PM EST ----- Call - the myeloma levels are creeping up slowly!!  We can still follow along since you are not having any problems!!!  Happy Thanksgiving!!  [name]

## 2015-06-23 NOTE — Progress Notes (Signed)
Hematology and Oncology Follow Up Visit  Christian Burns [id number] November 24, 1929 81 y.o. 06/23/2015   Principle Diagnosis:   Recurrent IgG lambda myeloma - progressive  Current Therapy:    Pomalidomide 2 mg by mouth daily (21/7)/Ixazomib 4mg  po q week (3/1)  Zometa 3.3 mg IV q. 4 weeks     Interim History:  Mr.  Burns is back for followup.   He is feeling well. He is doing well with the Ninlaro/Pomalidomide. So far, he's had no problems with it. He has completed 10 cycles.   His myeloma studies have been holding pretty steady. His last M spike was 1.4 g/dL. His IgG level was2250 mg/dL. His lambda light chain was 18.90 mg/dL.  He is not complaining of any back pain. He does have some neuropathy in his feet, mostly on his right side. 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  His wife is not doing too well. She really is having a hard time getting around. She basically is totally dependent upon him. As such, he really does not want to do anything that is going to interfere with him being out of care for her.  He is very thankful that he can take treatment at home with pills and not have to keep coming out of the office for injections or infusions.   Overall, his performance status is ECOG 2.    Medications:  Current outpatient prescriptions:  .  aspirin EC 81 MG tablet, Take 81 mg by mouth daily. , Disp: , Rfl:  .  Calcium Carb-Cholecalciferol (CALCIUM 600 + D PO), Take by mouth every morning., Disp: , Rfl:  .  Cyanocobalamin (VITAMIN B 12 PO), Take 1,000 mcg by mouth every morning. , Disp: , Rfl:  .  ixazomib citrate (NINLARO) 4 MG capsule, Take 1 capsule (4 mg total) by mouth See admin instructions. Take on empty stomach 1hr before food. Day #1, # 8, #15, then day # 28 repeat., Disp: 3 capsule, Rfl: 2 .  Multiple Vitamins-Minerals (MULTIVITAL PO), Take 1 tablet by mouth every morning. , Disp: , Rfl:  .  Pyridoxine HCl (VITAMIN B-6) 250 MG tablet, Take 500 mg  by mouth daily., Disp: , Rfl:  .  Vitamin D, Ergocalciferol, (DRISDOL) 50000 UNITS CAPS capsule, Take 50,000 Units by mouth every 7 (seven) days. Sundays, Disp: , Rfl:  .  pomalidomide (POMALYST) 2 MG capsule, Take 1 capsule (2 mg total) by mouth daily. Take with water on days 1-21. Repeat every 28 days. Auth# [id number], Disp: 21 capsule, Rfl: 0  Allergies:  Allergies  Allergen Reactions  . Sulfa Antibiotics Itching    Past Medical History, Surgical history, Social history, and Family History were reviewed and updated.  Review of Systems: As above  Physical Exam:  height is 5\' 8"  (1.727 m) and weight is 155 lb (70.308 kg). His oral temperature is 97.5 F (36.4 C). His blood pressure is 155/72 and his pulse is 63. His respiration is 14.   Thin, elderly gentleman. He is no distress. Head and neck exam shows no ocular or oral lesions. He has no palpable cervical or supraclavicular lymph nodes. Lungs are clear bilaterally. Cardiac exam is regular rate and rhythm with no murmurs rubs or bruits. Abdomen is soft. He has good bowel sounds. There is no fluid wave. There is no palpable liver or spleen. Back exam no tenderness over the spine ribs or hips. He has some kyphosis. Extremities shows some age related changes. He has good  range of motion and strength. Skin exam shows no rashes. Neurological exam is nonfocal.  Lab Results  Component Value Date   WBC 2.8* 06/20/2015   HGB 11.6* 06/20/2015   HCT 34.5* 06/20/2015   MCV 93 06/20/2015   PLT 119* 06/20/2015     Chemistry      Component Value Date/Time   NA 141 06/20/2015 0842   NA 140 07/15/2014 1345   K 4.1 06/20/2015 0842   K 4.6 07/15/2014 1345   CL 104 06/20/2015 0842   CL 106 07/15/2014 1345   CO2 25 06/20/2015 0842   CO2 27 07/15/2014 1345   BUN 23* 06/20/2015 0842   BUN 18 07/15/2014 1345   CREATININE 1.4* 06/20/2015 0842   CREATININE 1.41* 07/15/2014 1345      Component Value Date/Time   CALCIUM 9.0 06/20/2015 0842    CALCIUM 8.7 07/15/2014 1345   ALKPHOS 78 06/20/2015 0842   ALKPHOS 101 07/15/2014 1345   AST 30 06/20/2015 0842   AST 18 07/15/2014 1345   ALT 25 06/20/2015 0842   ALT 15 07/15/2014 1345   BILITOT 1.10 06/20/2015 0842   BILITOT 1.3* 07/15/2014 1345       Impression and Plan: Christian Burns is 81 year old gentleman with IgG lambda myeloma. He's on Pomalidomide/Ninlaro. His last myeloma studies are slowly going up. His M spike today was 1.5 g/dL. His IgG level was 2140 milligrams per deciliter. His lambda light chain was 28.8 mg/dL.  Again, all these are higher.  However, he just feels well. He's had no problems with respect to pain, weight loss, decreased appetite.  Overall, his quality of life is doing quite well.   I think we by get him back in about 6 weeks.    [name], MD 11/11/20165:19 PM

## 2015-06-26 ENCOUNTER — Other Ambulatory Visit: Payer: Self-pay | Admitting: *Deleted

## 2015-06-29 ENCOUNTER — Other Ambulatory Visit: Payer: Self-pay | Admitting: Nurse Practitioner

## 2015-06-29 DIAGNOSIS — M25551 Pain in right hip: Secondary | ICD-10-CM

## 2015-06-29 DIAGNOSIS — C9000 Multiple myeloma not having achieved remission: Secondary | ICD-10-CM

## 2015-06-29 MED ORDER — IXAZOMIB CITRATE 4 MG PO CAPS: 4.0000 mg | ORAL_CAPSULE | ORAL | Status: DC

## 2015-07-19 ENCOUNTER — Other Ambulatory Visit: Payer: Self-pay | Admitting: *Deleted

## 2015-07-19 DIAGNOSIS — C9000 Multiple myeloma not having achieved remission: Secondary | ICD-10-CM

## 2015-07-19 MED ORDER — POMALIDOMIDE 2 MG PO CAPS: 2.0000 mg | ORAL_CAPSULE | Freq: Every day | ORAL | Status: DC

## 2015-07-28 ENCOUNTER — Ambulatory Visit (HOSPITAL_BASED_OUTPATIENT_CLINIC_OR_DEPARTMENT_OTHER): Payer: Medicare HMO

## 2015-07-28 ENCOUNTER — Encounter: Payer: Self-pay | Admitting: Hematology & Oncology

## 2015-07-28 ENCOUNTER — Ambulatory Visit (HOSPITAL_BASED_OUTPATIENT_CLINIC_OR_DEPARTMENT_OTHER): Payer: Medicare HMO | Admitting: Hematology & Oncology

## 2015-07-28 VITALS — BP 169/79 | HR 62 | Temp 97.4°F | Resp 16 | Ht 68.0 in | Wt 154.0 lb

## 2015-07-28 DIAGNOSIS — C9002 Multiple myeloma in relapse: Secondary | ICD-10-CM

## 2015-07-28 DIAGNOSIS — G629 Polyneuropathy, unspecified: Secondary | ICD-10-CM

## 2015-07-28 DIAGNOSIS — C9000 Multiple myeloma not having achieved remission: Secondary | ICD-10-CM

## 2015-07-28 LAB — CBC WITH DIFFERENTIAL (CANCER CENTER ONLY)
BASO#: 0 10*3/uL (ref 0.0–0.2)
BASO%: 1.5 % (ref 0.0–2.0)
EOS%: 3.6 % (ref 0.0–7.0)
Eosinophils Absolute: 0.1 10*3/uL (ref 0.0–0.5)
HCT: 34.8 % — ABNORMAL LOW (ref 38.7–49.9)
HGB: 11.7 g/dL — ABNORMAL LOW (ref 13.0–17.1)
LYMPH#: 0.6 10*3/uL — ABNORMAL LOW (ref 0.9–3.3)
LYMPH%: 20.4 % (ref 14.0–48.0)
MCH: 31 pg (ref 28.0–33.4)
MCHC: 33.6 g/dL (ref 32.0–35.9)
MCV: 92 fL (ref 82–98)
MONO#: 0.8 10*3/uL (ref 0.1–0.9)
MONO%: 27.7 % — ABNORMAL HIGH (ref 0.0–13.0)
NEUT#: 1.3 10*3/uL — ABNORMAL LOW (ref 1.5–6.5)
NEUT%: 46.8 % (ref 40.0–80.0)
PLATELETS: 138 10*3/uL — AB (ref 145–400)
RBC: 3.77 10*6/uL — ABNORMAL LOW (ref 4.20–5.70)
RDW: 14.5 % (ref 11.1–15.7)
WBC: 2.7 10*3/uL — AB (ref 4.0–10.0)

## 2015-07-28 LAB — CMP (CANCER CENTER ONLY)
ALBUMIN: 3.2 g/dL — AB (ref 3.3–5.5)
ALT(SGPT): 23 U/L (ref 10–47)
AST: 31 U/L (ref 11–38)
Alkaline Phosphatase: 69 U/L (ref 26–84)
BILIRUBIN TOTAL: 1.1 mg/dL (ref 0.20–1.60)
BUN, Bld: 20 mg/dL (ref 7–22)
CALCIUM: 9.6 mg/dL (ref 8.0–10.3)
CO2: 28 meq/L (ref 18–33)
CREATININE: 1.4 mg/dL — AB (ref 0.6–1.2)
Chloride: 99 mEq/L (ref 98–108)
Glucose, Bld: 90 mg/dL (ref 73–118)
Potassium: 4 mEq/L (ref 3.3–4.7)
SODIUM: 139 meq/L (ref 128–145)
TOTAL PROTEIN: 8.4 g/dL — AB (ref 6.4–8.1)

## 2015-07-28 MED ORDER — ZOLEDRONIC ACID 4 MG/5ML IV CONC
3.3000 mg | Freq: Once | INTRAVENOUS | Status: AC
  Administered 2015-07-28: 3.3 mg via INTRAVENOUS
  Filled 2015-07-28: qty 4.13

## 2015-07-28 MED ORDER — HYDROCODONE-ACETAMINOPHEN 5-325 MG PO TABS: 1.0000 | ORAL_TABLET | Freq: Four times a day (QID) | ORAL | Status: DC | PRN

## 2015-07-28 NOTE — Progress Notes (Signed)
Hematology and Oncology Follow Up Visit  Christian Burns [id number] 1929-09-23 81 y.o. 07/28/2015   Principle Diagnosis:   Recurrent IgG lambda myeloma - progressive  Current Therapy:    Pomalidomide 2 mg by mouth daily (21/7)/Ixazomib 4mg  po q week (3/1)  Zometa 3.3 mg IV q. 4 weeks     Interim History:  Mr.  Burns is back for followup. He feels pretty well. He really has no specific complaints aside some discomfort in the right lower lateral ribs. This is been going on for a few weeks. It seems to be worse when he sits.  He does have the neuropathy in his feet. This has been holding pretty steady.  His wife still is having a hard time. She has really bad knees and has a hard time getting around. He is the one who really takes care of her.  When last saw him back in early November, his monoclonal studies seem to be going up. His SPEP showed a monoclonal spike of 1.5 g/dL. The IgG level was 2140 mg/dL. In the lambda light chain was 28.8 mg/dL.  He said no problems with bowels or bladder. He's had good appetite. He's had no cough. He's had no bleeding. He has had no rashes. He has had no leg swelling.   Overall, his performance status is ECOG 2.    Medications:  Current outpatient prescriptions:  .  aspirin EC 81 MG tablet, Take 81 mg by mouth daily. , Disp: , Rfl:  .  Calcium Carb-Cholecalciferol (CALCIUM 600 + D PO), Take by mouth every morning., Disp: , Rfl:  .  Cyanocobalamin (VITAMIN B 12 PO), Take 1,000 mcg by mouth every morning. , Disp: , Rfl:  .  ixazomib citrate (NINLARO) 4 MG capsule, Take 1 capsule (4 mg total) by mouth See admin instructions. Take on empty stomach 1hr before food. Day #1, # 8, #15, then day # 28 repeat., Disp: 3 capsule, Rfl: 2 .  Multiple Vitamins-Minerals (MULTIVITAL PO), Take 1 tablet by mouth every morning. , Disp: , Rfl:  .  pomalidomide (POMALYST) 2 MG capsule, Take 1 capsule (2 mg total) by mouth daily. Take with water on days 1-21. Repeat every  28 days. Auth# [id number], Disp: 21 capsule, Rfl: 0 .  Pyridoxine HCl (VITAMIN B-6) 250 MG tablet, Take 500 mg by mouth daily., Disp: , Rfl:  .  Vitamin D, Ergocalciferol, (DRISDOL) 50000 UNITS CAPS capsule, Take 50,000 Units by mouth every 7 (seven) days. Sundays, Disp: , Rfl:  .  HYDROcodone-acetaminophen (NORCO/VICODIN) 5-325 MG tablet, Take 1 tablet by mouth every 6 (six) hours as needed for moderate pain., Disp: 60 tablet, Rfl: 0  Allergies:  Allergies  Allergen Reactions  . Sulfa Antibiotics Itching    Past Medical History, Surgical history, Social history, and Family History were reviewed and updated.  Review of Systems: As above  Physical Exam:  height is 5\' 8"  (1.727 m) and weight is 154 lb (69.854 kg). His oral temperature is 97.4 F (36.3 C). His blood pressure is 169/79 and his pulse is 62. His respiration is 16.   Thin, elderly gentleman. He is no distress. Head and neck exam shows no ocular or oral lesions. He has no palpable cervical or supraclavicular lymph nodes. Lungs are clear bilaterally. Cardiac exam is regular rate and rhythm with no murmurs rubs or bruits. Abdomen is soft. He has good bowel sounds. There is no fluid wave. There is no palpable liver or spleen. Back exam no tenderness over  the spine ribs or hips. He has some kyphosis. Extremities shows some age related changes. He has good range of motion and strength. Skin exam shows no rashes. Neurological exam is nonfocal.  Lab Results  Component Value Date   WBC 2.7* 07/28/2015   HGB 11.7* 07/28/2015   HCT 34.8* 07/28/2015   MCV 92 07/28/2015   PLT 138* 07/28/2015     Chemistry      Component Value Date/Time   NA 139 07/28/2015 1116   NA 140 07/15/2014 1345   K 4.0 07/28/2015 1116   K 4.6 07/15/2014 1345   CL 99 07/28/2015 1116   CL 106 07/15/2014 1345   CO2 28 07/28/2015 1116   CO2 27 07/15/2014 1345   BUN 20 07/28/2015 1116   BUN 18 07/15/2014 1345   CREATININE 1.4* 07/28/2015 1116   CREATININE  1.41* 07/15/2014 1345      Component Value Date/Time   CALCIUM 9.6 07/28/2015 1116   CALCIUM 8.7 07/15/2014 1345   ALKPHOS 69 07/28/2015 1116   ALKPHOS 101 07/15/2014 1345   AST 31 07/28/2015 1116   AST 18 07/15/2014 1345   ALT 23 07/28/2015 1116   ALT 15 07/15/2014 1345   BILITOT 1.10 07/28/2015 1116   BILITOT 1.3* 07/15/2014 1345       Impression and Plan: Christian Burns is 81 year old gentleman with IgG lambda myeloma. He's on Pomalidomide/Ninlaro. He has been on this now probably for close to a year.  I have a bad feeling that he is becoming more resistant to therapy. It has been nice that he's been on oral therapy. That way, he can be home to help out with his wife.  I think we find that he is progressing, then we are going to have to consider another option. He has not had Kyprolis yet. I did this would be reasonable for him.  I have wanted to avoid IV therapy because this would make it harder for him with take care of his wife. However, I think if we have to consider IV therapy, then we might be able to get him treated at the main [facility] which is a lot closer to his home.   He still wants to see [place]. However, if he can be treated at the main [facility], that would be better for him.  I will plan to see him back in 6 weeks. I think we will definitely know at that point whether or not we need to switch therapy on him.  I spent about 30 minutes with him today.  [name], MD 12/16/20161:26 PM

## 2015-07-28 NOTE — Patient Instructions (Signed)

## 2015-07-29 LAB — IGG, IGA, IGM
IgA: 204 mg/dL (ref 68–379)
IgG (Immunoglobin G), Serum: 2580 mg/dL — ABNORMAL HIGH (ref 650–1600)
IgM, Serum: 58 mg/dL (ref 41–251)

## 2015-08-02 LAB — PROTEIN ELECTROPHORESIS, SERUM, WITH REFLEX
ABNORMAL PROTEIN BAND1: 1.8 g/dL
ALPHA-1-GLOBULIN: 0.3 g/dL (ref 0.2–0.3)
ALPHA-2-GLOBULIN: 0.7 g/dL (ref 0.5–0.9)
Albumin ELP: 3.7 g/dL — ABNORMAL LOW (ref 3.8–4.8)
Beta 2: 0.3 g/dL (ref 0.2–0.5)
Beta Globulin: 0.4 g/dL (ref 0.4–0.6)
GAMMA GLOBULIN: 2.5 g/dL — AB (ref 0.8–1.7)
Total Protein, Serum Electrophoresis: 7.9 g/dL (ref 6.1–8.1)

## 2015-08-02 LAB — KAPPA/LAMBDA LIGHT CHAINS
KAPPA LAMBDA RATIO: 0.08 — AB (ref 0.26–1.65)
Kappa free light chain: 3.11 mg/dL — ABNORMAL HIGH (ref 0.33–1.94)
LAMBDA FREE LGHT CHN: 37.9 mg/dL — AB (ref 0.57–2.63)

## 2015-08-02 LAB — IFE INTERPRETATION

## 2015-08-18 ENCOUNTER — Other Ambulatory Visit: Payer: Self-pay | Admitting: *Deleted

## 2015-08-18 DIAGNOSIS — C9000 Multiple myeloma not having achieved remission: Secondary | ICD-10-CM

## 2015-08-23 ENCOUNTER — Ambulatory Visit (HOSPITAL_COMMUNITY): Payer: Medicare HMO

## 2015-08-24 ENCOUNTER — Other Ambulatory Visit: Payer: Self-pay | Admitting: *Deleted

## 2015-08-24 DIAGNOSIS — C9000 Multiple myeloma not having achieved remission: Secondary | ICD-10-CM

## 2015-08-24 MED ORDER — POMALIDOMIDE 2 MG PO CAPS: 2.0000 mg | ORAL_CAPSULE | Freq: Every day | ORAL | Status: DC

## 2015-08-26 ENCOUNTER — Ambulatory Visit (HOSPITAL_BASED_OUTPATIENT_CLINIC_OR_DEPARTMENT_OTHER): Payer: Medicare HMO

## 2015-09-08 ENCOUNTER — Ambulatory Visit (HOSPITAL_BASED_OUTPATIENT_CLINIC_OR_DEPARTMENT_OTHER): Payer: Medicare HMO | Admitting: Hematology & Oncology

## 2015-09-08 ENCOUNTER — Ambulatory Visit (HOSPITAL_BASED_OUTPATIENT_CLINIC_OR_DEPARTMENT_OTHER): Payer: Medicare HMO

## 2015-09-08 ENCOUNTER — Encounter: Payer: Self-pay | Admitting: Hematology & Oncology

## 2015-09-08 ENCOUNTER — Other Ambulatory Visit (HOSPITAL_BASED_OUTPATIENT_CLINIC_OR_DEPARTMENT_OTHER): Payer: Medicare HMO

## 2015-09-08 VITALS — BP 155/80 | HR 65 | Temp 97.5°F | Resp 14 | Ht 68.0 in | Wt 144.0 lb

## 2015-09-08 DIAGNOSIS — C9000 Multiple myeloma not having achieved remission: Secondary | ICD-10-CM

## 2015-09-08 DIAGNOSIS — C9002 Multiple myeloma in relapse: Secondary | ICD-10-CM

## 2015-09-08 DIAGNOSIS — C9001 Multiple myeloma in remission: Secondary | ICD-10-CM

## 2015-09-08 LAB — CBC WITH DIFFERENTIAL (CANCER CENTER ONLY)
BASO#: 0.1 10*3/uL (ref 0.0–0.2)
BASO%: 1.2 % (ref 0.0–2.0)
EOS%: 2.7 % (ref 0.0–7.0)
Eosinophils Absolute: 0.1 10*3/uL (ref 0.0–0.5)
HEMATOCRIT: 32.3 % — AB (ref 38.7–49.9)
HEMOGLOBIN: 11 g/dL — AB (ref 13.0–17.1)
LYMPH#: 0.5 10*3/uL — AB (ref 0.9–3.3)
LYMPH%: 9.8 % — ABNORMAL LOW (ref 14.0–48.0)
MCH: 30.9 pg (ref 28.0–33.4)
MCHC: 34.1 g/dL (ref 32.0–35.9)
MCV: 91 fL (ref 82–98)
MONO#: 0.9 10*3/uL (ref 0.1–0.9)
MONO%: 17 % — ABNORMAL HIGH (ref 0.0–13.0)
NEUT%: 69.3 % (ref 40.0–80.0)
NEUTROS ABS: 3.6 10*3/uL (ref 1.5–6.5)
Platelets: 216 10*3/uL (ref 145–400)
RBC: 3.56 10*6/uL — AB (ref 4.20–5.70)
RDW: 14.1 % (ref 11.1–15.7)
WBC: 5.2 10*3/uL (ref 4.0–10.0)

## 2015-09-08 LAB — CMP (CANCER CENTER ONLY)
ALBUMIN: 3.1 g/dL — AB (ref 3.3–5.5)
ALK PHOS: 73 U/L (ref 26–84)
ALT: 15 U/L (ref 10–47)
AST: 28 U/L (ref 11–38)
BILIRUBIN TOTAL: 1.2 mg/dL (ref 0.20–1.60)
BUN, Bld: 26 mg/dL — ABNORMAL HIGH (ref 7–22)
CALCIUM: 11.9 mg/dL — AB (ref 8.0–10.3)
CO2: 28 mEq/L (ref 18–33)
Chloride: 102 mEq/L (ref 98–108)
Creat: 1.3 mg/dl — ABNORMAL HIGH (ref 0.6–1.2)
GLUCOSE: 98 mg/dL (ref 73–118)
POTASSIUM: 4.2 meq/L (ref 3.3–4.7)
Sodium: 140 mEq/L (ref 128–145)
TOTAL PROTEIN: 9.1 g/dL — AB (ref 6.4–8.1)

## 2015-09-08 MED ORDER — SODIUM CHLORIDE 0.9 % IV SOLN
Freq: Once | INTRAVENOUS | Status: AC
  Administered 2015-09-08: 13:00:00 via INTRAVENOUS

## 2015-09-08 MED ORDER — SODIUM CHLORIDE 0.9 % IV SOLN: Freq: Once | INTRAVENOUS | Status: DC

## 2015-09-08 MED ORDER — ZOLEDRONIC ACID 4 MG/100ML IV SOLN
4.0000 mg | Freq: Once | INTRAVENOUS | Status: AC
  Administered 2015-09-08: 4 mg via INTRAVENOUS
  Filled 2015-09-08: qty 100

## 2015-09-08 MED ORDER — CYCLOPHOSPHAMIDE 50 MG PO TABS: ORAL_TABLET | ORAL | Status: DC

## 2015-09-08 MED ORDER — ZOLEDRONIC ACID 4 MG/5ML IV CONC: 3.3000 mg | Freq: Once | INTRAVENOUS | Status: DC

## 2015-09-08 MED FILL — *CYCLOPHOSPHAMIDE 50 MG CAP: 50 | 21 days supply | Qty: 15 | Fill #0

## 2015-09-08 NOTE — Patient Instructions (Signed)

## 2015-09-08 NOTE — Progress Notes (Signed)
Hematology and Oncology Follow Up Visit  Christian Burns [id number] 1930/01/04 82 y.o. 09/08/2015   Principle Diagnosis:   Recurrent IgG lambda myeloma - progressive  Current Therapy:    Cytoxan 250mg  po q wk (3/1)/Ixazomib 4mg  po q week (3/1) - to start next week  Zometa 3.3 mg IV q. 4 weeks     Interim History:  Mr.  Burns is back for followup. Unfortunately, I think it is apparent that his myeloma is progressing. He's lost some weight. He's having more pain over in the right hip area. 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  He is tolerating treatment quite well. He has been on this now for about a year.  His myeloma studies show continued progression. His M spike was 1.8 g/dL. Back in December. His IgG level was 2580 mg/dL. His lambda light chain was 38 mg/dL.  His wife still is not doing all that well. He is her primary caretaker.  He's had no fever. He's had no bleeding. He's had no change in bowel or bladder habits.  He's had no nausea or vomiting. His appetite is still doing okay.   Overall, his performance status is ECOG 2.    Medications:  Current outpatient prescriptions:  .  aspirin EC 81 MG tablet, Take 81 mg by mouth daily. , Disp: , Rfl:  .  Calcium Carb-Cholecalciferol (CALCIUM 600 + D PO), Take by mouth every morning., Disp: , Rfl:  .  Cyanocobalamin (VITAMIN B 12 PO), Take 1,000 mcg by mouth every morning. , Disp: , Rfl:  .  HYDROcodone-acetaminophen (NORCO/VICODIN) 5-325 MG tablet, Take 1 tablet by mouth every 6 (six) hours as needed for moderate pain., Disp: 60 tablet, Rfl: 0 .  ixazomib citrate (NINLARO) 4 MG capsule, Take 1 capsule (4 mg total) by mouth See admin instructions. Take on empty stomach 1hr before food. Day #1, # 8, #15, then day # 28 repeat., Disp: 3 capsule, Rfl: 2 .  Multiple Vitamins-Minerals (MULTIVITAL PO), Take 1 tablet by mouth every morning. , Disp: , Rfl:  .  Pyridoxine HCl (VITAMIN B-6) 250 MG tablet, Take 500  mg by mouth daily., Disp: , Rfl:  .  Vitamin D, Ergocalciferol, (DRISDOL) 50000 UNITS CAPS capsule, Take 50,000 Units by mouth every 7 (seven) days. Sundays, Disp: , Rfl:  .  cyclophosphamide (CYTOXAN) 50 MG tablet, Take 5 capsules on an empty stomach once a week for 3 weeks, then off 1 week, Disp: 15 tablet, Rfl: 6 No current facility-administered medications for this visit.  Facility-Administered Medications Ordered in Other Visits:  .  0.9 %  sodium chloride infusion, , Intravenous, Once, [name], MD  Allergies:  Allergies  Allergen Reactions  . Sulfa Antibiotics Itching    Past Medical History, Surgical history, Social history, and Family History were reviewed and updated.  Review of Systems: As above  Physical Exam:  height is 5\' 8"  (1.727 m) and weight is 144 lb (65.318 kg). His oral temperature is 97.5 F (36.4 C). His blood pressure is 155/80 and his pulse is 65. His respiration is 14.   Thin, elderly gentleman. He is no distress. Head and neck exam shows no ocular or oral lesions. He has no palpable cervical or supraclavicular lymph nodes. Lungs are clear bilaterally. Cardiac exam is regular rate and rhythm with no murmurs rubs or bruits. Abdomen is soft. He has good bowel sounds. There is no fluid wave. There is no palpable liver or spleen.  Back exam no tenderness over the spine ribs or hips. He has some kyphosis. Extremities shows some age related changes. He has good range of motion and strength. Skin exam shows no rashes. Neurological exam is nonfocal.  Lab Results  Component Value Date   WBC 5.2 09/08/2015   HGB 11.0* 09/08/2015   HCT 32.3* 09/08/2015   MCV 91 09/08/2015   PLT 216 09/08/2015     Chemistry      Component Value Date/Time   NA 140 09/08/2015 1029   NA 140 07/15/2014 1345   K 4.2 09/08/2015 1029   K 4.6 07/15/2014 1345   CL 102 09/08/2015 1029   CL 106 07/15/2014 1345   CO2 28 09/08/2015 1029   CO2 27 07/15/2014 1345   BUN 26* 09/08/2015  1029   BUN 18 07/15/2014 1345   CREATININE 1.3* 09/08/2015 1029   CREATININE 1.41* 07/15/2014 1345      Component Value Date/Time   CALCIUM 11.9* 09/08/2015 1029   CALCIUM 8.7 07/15/2014 1345   ALKPHOS 73 09/08/2015 1029   ALKPHOS 101 07/15/2014 1345   AST 28 09/08/2015 1029   AST 18 07/15/2014 1345   ALT 15 09/08/2015 1029   ALT 15 07/15/2014 1345   BILITOT 1.20 09/08/2015 1029   BILITOT 1.3* 07/15/2014 1345       Impression and Plan: Christian Burns is an 82 year old gentleman with IgG lambda myeloma. He's on Pomalidomide/Ninlaro. He has been on this now probably for close to a year.  I think that it is clear that he is progressing. I think the weight loss is indicative of this. His myeloma studies also are highly suggestive of progression.  I think what we should try is all oral therapy still. I probably would drop the Pomalidomide and have him start Cytoxan. This way, he would be able to stay home. His wife is very crippled by arthritis. He really is her caretaker. It is hard for him to be away from her for any length of time.  I talked to Christian Burns about his myeloma. I showed him the myeloma studies. He understands why we have to make a change.  I want him to take the Ninlaro and the Cytoxan at the same time weekly for 3 weeks in a row and then off for one week.  His dose of Cytoxan will be 250 mg orally weekly.  I think that this does not work, though he may have to utilize IV therapy. Kyprolis probably would be a good idea.  His calcium was elevated. Again this might be indicative of his myeloma. He will get full dose Zometa today.  I also want to get an MRI of his back. I think the pain that he is having is probably reflective of myeloma in the lumbar spine.   I will like to see him back in 3-4 weeks. I told him to give [place] a call if there is any problems before then.  I spent about 45 minutes with him today.  [name], MD 1/27/20176:37 PM

## 2015-09-09 LAB — IGG, IGA, IGM
IGA/IMMUNOGLOBULIN A, SERUM: 192 mg/dL (ref 61–437)
IGM (IMMUNOGLOBIN M), SRM: 55 mg/dL (ref 15–143)
IgG, Qn, Serum: 3369 mg/dL — ABNORMAL HIGH (ref 700–1600)

## 2015-09-11 ENCOUNTER — Telehealth: Payer: Self-pay | Admitting: Hematology & Oncology

## 2015-09-11 DIAGNOSIS — K5909 Other constipation: Secondary | ICD-10-CM | POA: Diagnosis not present

## 2015-09-11 DIAGNOSIS — Z6820 Body mass index (BMI) 20.0-20.9, adult: Secondary | ICD-10-CM | POA: Diagnosis not present

## 2015-09-11 DIAGNOSIS — K469 Unspecified abdominal hernia without obstruction or gangrene: Secondary | ICD-10-CM | POA: Diagnosis not present

## 2015-09-11 LAB — KAPPA/LAMBDA LIGHT CHAINS
Ig Kappa Free Light Chain: 34.1 mg/L — ABNORMAL HIGH (ref 3.30–19.40)
Ig Lambda Free Light Chain: 561.02 mg/L — ABNORMAL HIGH (ref 5.71–26.30)
KAPPA/LAMBDA FLC RATIO: 0.06 — AB (ref 0.26–1.65)

## 2015-09-11 NOTE — Telephone Encounter (Signed)
Pt has been APPROVED for the [employer]. ° °He has been awarded $10,000 valid for 02/26/2015 to 08/24/2016. °Fund: Multiple Myeloma °Cardholder: [id number] °BIN: [id number] °PCN: [id number] °Group: [id number] ° °For pharmacy inquiries contact PDMI at [phone] and patient inquiries contact PAF [phone] ° ° ° ° °COPY SCANNED °. ° ° ° ° ° °

## 2015-09-12 LAB — PROTEIN ELECTROPHORESIS, SERUM, WITH REFLEX
A/G RATIO SPE: 0.8 (ref 0.7–1.7)
Albumin: 3.5 g/dL (ref 2.9–4.4)
Alpha 1: 0.3 g/dL (ref 0.0–0.4)
Alpha 2: 0.7 g/dL (ref 0.4–1.0)
BETA: 0.9 g/dL (ref 0.7–1.3)
GAMMA GLOBULIN: 2.6 g/dL — AB (ref 0.4–1.8)
GLOBULIN, TOTAL: 4.6 g/dL — AB (ref 2.2–3.9)
Interpretation(See Below): 0
M-SPIKE, %: 2.3 g/dL — AB
TOTAL PROTEIN: 8.1 g/dL (ref 6.0–8.5)

## 2015-09-13 ENCOUNTER — Ambulatory Visit (HOSPITAL_COMMUNITY)
Admission: RE | Admit: 2015-09-13 | Discharge: 2015-09-13 | Disposition: A | Discharge status: Home / Self Care | Payer: Medicare HMO | Source: Ambulatory Visit | Attending: Hematology & Oncology | Admitting: Hematology & Oncology

## 2015-09-13 ENCOUNTER — Ambulatory Visit (HOSPITAL_COMMUNITY): Payer: Medicare HMO

## 2015-09-13 DIAGNOSIS — R634 Abnormal weight loss: Secondary | ICD-10-CM | POA: Insufficient documentation

## 2015-09-13 DIAGNOSIS — M5136 Other intervertebral disc degeneration, lumbar region: Secondary | ICD-10-CM | POA: Insufficient documentation

## 2015-09-13 DIAGNOSIS — M4805 Spinal stenosis, thoracolumbar region: Secondary | ICD-10-CM | POA: Insufficient documentation

## 2015-09-13 DIAGNOSIS — C9000 Multiple myeloma not having achieved remission: Secondary | ICD-10-CM | POA: Insufficient documentation

## 2015-09-13 DIAGNOSIS — R229 Localized swelling, mass and lump, unspecified: Secondary | ICD-10-CM | POA: Diagnosis not present

## 2015-09-13 DIAGNOSIS — M5126 Other intervertebral disc displacement, lumbar region: Secondary | ICD-10-CM | POA: Diagnosis not present

## 2015-09-13 DIAGNOSIS — M25551 Pain in right hip: Secondary | ICD-10-CM | POA: Insufficient documentation

## 2015-09-13 MED ORDER — GADOBENATE DIMEGLUMINE 529 MG/ML IV SOLN
13.0000 mL | Freq: Once | INTRAVENOUS | Status: AC | PRN
  Administered 2015-09-13: 13 mL via INTRAVENOUS

## 2015-09-14 ENCOUNTER — Telehealth: Payer: Self-pay | Admitting: *Deleted

## 2015-09-14 NOTE — Telephone Encounter (Signed)
-----   Message from [name], MD sent at 09/13/2015  6:20 PM EST ----- I left a message on Christian Burns answering machine. I call him about the results of his MRI. 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 This measures 5.7 x 5.4 cm. This is, in all likelihood, this is the reason he is having more pain on the right side.  I told him that I will need to speak with Dr. Sondra Come of radiation oncology. I think radiation therapy will work very well for this lesion.  I told him that he can call us if he has any questions. Hopefully, start him on the oral Cytoxan with the Ninlaro might help.   [name]

## 2015-09-15 NOTE — Progress Notes (Signed)
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 This measures 5.7 x 5.4 cm.  Past/Anticipated chemotherapy by medical oncology, if any: Cytoxan 254m po q wk (3/1)/Ixazomib 442mpo q week (3/1) - to start next week and Zometa 3.3 mg IV q. 4 weeks  Pain on a scale of 0-10 is: He complains of pain in his right mid to lower back. He is taking  One hydrocodone 3 times daily when he experiences his pain. He reports the pain occurs with movement or when he shifts his weight.    If Spine Met(s), symptoms, if any, include:  Bowel/Bladder retention or incontinence: He report some constipation related to pain pills. He has not had a bowel movement in 4 days. He takes Miralax once daily.   Numbness or weakness in extremities He reports numbness in his feet, but reports this has been a chronic condition.  Current Decadron regimen, if applicable: No  Ambulatory status? Walker? Wheelchair?: He is walking  SAFETY ISSUES:  Prior radiation? 06/17/13-07/06/13 -35 gray to upper lumbar spine   Pacemaker/ICD? no  Possible current pregnancy? no  Is the patient on methotrexate? no  Current Complaints / other details:  None  BP 154/76 mmHg  Pulse 86  Temp(Src) 97.6 F (36.4 C)  Ht 5' 11"  (1.803 m)  Wt 141 lb 11.2 oz (64.275 kg)  BMI 19.77 kg/m2  SpO2 98%   Wt Readings from Last 3 Encounters:  09/20/15 141 lb 11.2 oz (64.275 kg)  09/08/15 144 lb (65.318 kg)  07/28/15 154 lb (69.854 kg)

## 2015-09-19 ENCOUNTER — Ambulatory Visit (HOSPITAL_COMMUNITY): Payer: Medicare HMO

## 2015-09-20 ENCOUNTER — Ambulatory Visit
Admission: RE | Admit: 2015-09-20 | Discharge: 2015-09-20 | Disposition: A | Discharge status: Home / Self Care | Payer: Medicare HMO | Source: Ambulatory Visit | Attending: Radiation Oncology | Admitting: Radiation Oncology

## 2015-09-20 ENCOUNTER — Encounter: Payer: Self-pay | Admitting: Radiation Oncology

## 2015-09-20 VITALS — BP 154/76 | HR 86 | Temp 97.6°F | Ht 71.0 in | Wt 141.7 lb

## 2015-09-20 DIAGNOSIS — C9000 Multiple myeloma not having achieved remission: Secondary | ICD-10-CM | POA: Insufficient documentation

## 2015-09-20 DIAGNOSIS — M899 Disorder of bone, unspecified: Secondary | ICD-10-CM | POA: Insufficient documentation

## 2015-09-20 DIAGNOSIS — C9002 Multiple myeloma in relapse: Secondary | ICD-10-CM | POA: Diagnosis not present

## 2015-09-20 DIAGNOSIS — Z510 Encounter for antineoplastic radiation therapy: Secondary | ICD-10-CM | POA: Insufficient documentation

## 2015-09-20 NOTE — Progress Notes (Signed)
Radiation Oncology         [phone] ________________________________  Name: Christian Burns MRN: [id number]  Date: 09/20/2015  DOB: 1929/10/13  Re-Consultation Visit Note  CC: Donnajean Lopes, MD  [name], MD  Diagnosis: Progressive Multiple Myeloma  Interval Since Last Radiation:  1 year and 2 months. 06/18/15-07/06/13  Narrative:  The patient returns today for a re-consultation. His myeloma studies show continued progression. In December 2016, his M-SPIKE spike was 1.8 g/dL and hs IgG level was 2580 mg/dL. His lambda light chain was 37.9 mg/dL. He saw Dr. Marin Olp on 09/08/15 who put him on Ninlaro and  Cytoxan at the same time weekly for 3 weeks in a row and then off for one week. He is also taking Zometa. Dr. Marin Olp has dropped the Pomalidomide. The patient had an MRI of the lumbar spine on 09/13/15 that showed progression of multiple myeloma with a new large mass emanating out of the right side of the T12 vertebral body into the paraspinous musculature. This lesion obliterates the right T11-12 foramen and causes right foraminal stenosis at T12-L1. New labs on 09/08/15 show his M-SPIKE is 2.3 g/dL and the IgG level is 3,370 mg/dL. The patient presents to the clinic with his son.  The patient localizes pain in his right hip and right lower back. He denies right leg weakness, but has feet numbness and his fingers are "tinggly." He does not have any left sided pain. He still has complaints of a hernia in the right groin area. He reports that his bulges when he strains during a bowel movement.  ALLERGIES:  is allergic to sulfa antibiotics.  Meds: Current Outpatient Prescriptions  Medication Sig Dispense Refill  . aspirin EC 81 MG tablet Take 81 mg by mouth daily.     . Calcium Carb-Cholecalciferol (CALCIUM 600 + D PO) Take by mouth every morning.    . Cyanocobalamin (VITAMIN B 12 PO) Take 1,000 mcg by mouth every morning.     . cyclophosphamide (CYTOXAN) 50 MG tablet Take 5  capsules on an empty stomach once a week for 3 weeks, then off 1 week 15 tablet 6  . HYDROcodone-acetaminophen (NORCO/VICODIN) 5-325 MG tablet Take 1 tablet by mouth every 6 (six) hours as needed for moderate pain. 60 tablet 0  . ixazomib citrate (NINLARO) 4 MG capsule Take 1 capsule (4 mg total) by mouth See admin instructions. Take on empty stomach 1hr before food. Day #1, # 8, #15, then day # 28 repeat. 3 capsule 2  . Multiple Vitamins-Minerals (MULTIVITAL PO) Take 1 tablet by mouth every morning.     . polyethylene glycol (MIRALAX / GLYCOLAX) packet Take 17 g by mouth daily as needed.    . Pyridoxine HCl (VITAMIN B-6) 250 MG tablet Take 500 mg by mouth daily.    . Vitamin D, Ergocalciferol, (DRISDOL) 50000 UNITS CAPS capsule Take 50,000 Units by mouth every 7 (seven) days. Sundays     No current facility-administered medications for this encounter.    Physical Findings: The patient is in no acute distress. Patient is alert and oriented.  height is 5' 11"  (1.803 m) and weight is 141 lb 11.2 oz (64.275 kg). His temperature is 97.6 F (36.4 C). His blood pressure is 154/76 and his pulse is 86. His oxygen saturation is 98%.   The lungs are clear. The heart has a regular rhythm and rate. The abdomen is soft and nontender with normal bowel sounds. Patient has a easily reducible hernia in  the right groin area. This disappears completely when the patient is lying flat.  Motor strength is 5 out of 5 in the proximal and distal muscle groups of the lower extremities.  Lab Findings: Lab Results  Component Value Date   WBC 5.2 09/08/2015   HGB 11.0* 09/08/2015   HCT 32.3* 09/08/2015   MCV 91 09/08/2015   PLT 216 09/08/2015    Radiographic Findings: MR Lumbar Spine W Wo Contrast  CLINICAL DATA: Recurrent multiple myeloma. Weight loss. Right hip pain. EXAM: MRI LUMBAR SPINE WITHOUT AND WITH CONTRAST TECHNIQUE: Multiplanar and multiecho pulse sequences of the lumbar spine were obtained without  and with intravenous contrast. CONTRAST: 13 mL MULTIHANCE GADOBENATE DIMEGLUMINE 529 MG/ML IV SOLN COMPARISON: Lumbar spine MRI 12/31/2013. Total spine MRI 08/18/2013. CT abdomen and pelvis 06/08/2013. FINDINGS: Again seen is a severe L2 compression fracture where the patient is status post vertebral augmentation. No other fracture is identified. Multifocal marrow signal abnormality is seen with T2 hyperintensities identified in all imaged bones. There is a new mass emanating out of the right side of the T12 vertebral body into the paraspinous space. The lesion measures 5.7 cm AP x 5.4 cm transverse x 5.6 cm craniocaudal. The lesion obliterates the right T11-12 foramen and causes marked right foraminal narrowing at T12-L1. The conus medullaris is normal in signal and position.  Imaged intra-abdominal contents demonstrate bilateral renal cysts. Two T2 hyperintense lesions in the liver are unchanged and likely represent cysts. Splenic artery aneurysm is noted as seen on prior CT scan.  T11-12: Tumor invades the right foramen as described above. The cord is slightly deflected to the left. The left foramen is open.  T12-L1: Tumor extends into the superior aspect of the right foramen causing moderate to moderately severe stenosis. The central canal and left foramen are open.  L1-2: Mild bony retropulsion off the superior endplate eccentric to the right is small disc bulge are unchanged. There is mild narrowing in the right lateral recess and moderate right foraminal stenosis. The left foramen is open.  L2-3: Shallow disc bulge and endplate spur are unchanged in appearance. The central canal and foramina appear open.  L3-4: Shallow disc bulge and very small central protrusion are unchanged. The central canal and foramina are open.  L4-5: Shallow disc bulge and mild facet degenerative disease without central canal or foraminal stenosis, unchanged.  L5-S1: Shallow disc bulge  with endplate spur. The central canal is open. Mild bilateral foraminal narrowing is unchanged.  IMPRESSION: Dominant finding is progression of multiple myeloma. There is a new large mass emanating out of the right side of the T12 vertebral body into the paraspinous musculature. The lesion obliterates the right T11-12 foramen and causes right foraminal stenosis at T12-L1. Status post vertebral augmentation for compression fracture of L2, unchanged. No change in mild degenerative disease.  Electronically Signed  By: Inge Rise M.D.  On: 09/13/2015 15:39  Impression:   Progressive multiple myeloma. The patient has a large lesion at the T12 area as above which is likely explaining his pain complaints. The patient is a good candidate for palliative radiation to the lesion emanating out of the right side of the T12 vertebral body.  Plan: I spoke to the patient today regarding his diagnosis and options for treatment. We discussed the role of radiation as palliative treatment. We discussed the process of CTsimulation and the placement tattoos. We discussed 3 weeks of treatment as an outpatient.  We discussed the possible side effects including but not  limited to skin redness, fatigue, diarrhea, and nausea. CT simulation is scheduled tomorrow at [facility]. The patient signed a consent form and this was placed in his medical chart. Simulation tomorrow morning with treatments to begin next week. Anticipate 3 weeks of radiation therapy.   ____________________________________  Blair Promise, PhD, MD  This document serves as a record of services personally performed by Gery Pray, MD. It was created on his behalf by Darcus Austin, a trained medical scribe. The creation of this record is based on the scribe's personal observations and the provider's statements to them. This document has been checked and approved by the attending provider.

## 2015-09-21 ENCOUNTER — Ambulatory Visit
Admission: RE | Admit: 2015-09-21 | Discharge: 2015-09-21 | Disposition: A | Discharge status: Home / Self Care | Payer: Medicare HMO | Source: Ambulatory Visit | Attending: Radiation Oncology | Admitting: Radiation Oncology

## 2015-09-21 DIAGNOSIS — C9002 Multiple myeloma in relapse: Secondary | ICD-10-CM

## 2015-09-21 DIAGNOSIS — Z510 Encounter for antineoplastic radiation therapy: Secondary | ICD-10-CM | POA: Diagnosis not present

## 2015-09-21 DIAGNOSIS — C9000 Multiple myeloma not having achieved remission: Secondary | ICD-10-CM | POA: Diagnosis not present

## 2015-09-21 DIAGNOSIS — M899 Disorder of bone, unspecified: Secondary | ICD-10-CM | POA: Diagnosis not present

## 2015-09-21 NOTE — Progress Notes (Signed)
  Radiation Oncology         [phone] ________________________________  Name: Christian Burns MRN: [id number]  Date: 09/21/2015  DOB: 1929-09-10  SIMULATION AND TREATMENT PLANNING NOTE   DIAGNOSIS:  Progressive Multiple Myeloma  NARRATIVE:  The patient was brought to the [facility].  Identity was confirmed.  All relevant records and images related to the planned course of therapy were reviewed.  The patient freely provided informed written consent to proceed with treatment after reviewing the details related to the planned course of therapy. The consent form was witnessed and verified by the simulation staff.  Then, the patient was set-up in a stable reproducible  supine position for radiation therapy.  CT images were obtained.  Surface markings were placed.  The CT images were loaded into the planning software.  Then the target and avoidance structures were contoured.  Treatment planning then occurred.  The radiation prescription was entered and confirmed.  Then, I designed and supervised the construction of a total of 5 medically necessary complex treatment devices.  I have requested : 3D Simulation  I have requested a DVH of the following structures: GTV, PTV, spinal cord kidneys.  I have ordered:dose calc.  PLAN:  The patient will receive 35 Gy in 14 fractions.  ________________________________  Special treatment procedure note:  Patient is received previous radiation treatment adjacent to his current radiation field area,  additional time was taken in reviewing the patient's previous treatment as it relates to his current set up. Given this increased time and the potential for increased toxicities with overlapping fields, and dose accumulation issues, this constitutes a special treatment procedure  -----------------------------  Blair Promise, PhD, MD    This document serves as a record of services personally performed by Gery Pray, MD. It was created on his  behalf by Lendon Collar, a trained medical scribe. The creation of this record is based on the scribe's personal observations and the provider's statements to them. This document has been checked and approved by the attending provider.

## 2015-09-25 DIAGNOSIS — Z510 Encounter for antineoplastic radiation therapy: Secondary | ICD-10-CM | POA: Diagnosis not present

## 2015-09-25 DIAGNOSIS — C9002 Multiple myeloma in relapse: Secondary | ICD-10-CM | POA: Diagnosis not present

## 2015-09-25 DIAGNOSIS — C9000 Multiple myeloma not having achieved remission: Secondary | ICD-10-CM | POA: Diagnosis not present

## 2015-09-25 DIAGNOSIS — M899 Disorder of bone, unspecified: Secondary | ICD-10-CM | POA: Diagnosis not present

## 2015-09-26 ENCOUNTER — Encounter: Payer: Self-pay | Admitting: Radiation Oncology

## 2015-09-26 ENCOUNTER — Ambulatory Visit
Admission: RE | Admit: 2015-09-26 | Discharge: 2015-09-26 | Disposition: A | Discharge status: Home / Self Care | Payer: Medicare HMO | Source: Ambulatory Visit | Attending: Radiation Oncology | Admitting: Radiation Oncology

## 2015-09-26 VITALS — BP 151/81 | HR 81 | Temp 97.9°F | Ht 71.0 in | Wt 142.5 lb

## 2015-09-26 DIAGNOSIS — C9002 Multiple myeloma in relapse: Secondary | ICD-10-CM | POA: Diagnosis not present

## 2015-09-26 DIAGNOSIS — C9000 Multiple myeloma not having achieved remission: Secondary | ICD-10-CM | POA: Diagnosis not present

## 2015-09-26 DIAGNOSIS — Z510 Encounter for antineoplastic radiation therapy: Secondary | ICD-10-CM | POA: Diagnosis not present

## 2015-09-26 DIAGNOSIS — M899 Disorder of bone, unspecified: Secondary | ICD-10-CM | POA: Diagnosis not present

## 2015-09-26 NOTE — Progress Notes (Signed)
  Radiation Oncology         [phone] ________________________________  Name: Christian Burns MRN: [id number]  Date: 09/26/2015  DOB: 02-22-1930  Weekly Radiation Therapy Management  Multiple myeloma in relapse ([place])    Current Dose: 2 Gy     Planned Dose:  10+ Gy  Narrative . . . . . . . . The patient presents for routine under treatment assessment.                                   Christian Burns has completed 1 fraction to his T spine. He reports having pain in his right lower back and right pelvic area. He is taking hydrocodone/acetaminophen twice a day. He is concerned because he has a hernia in his right groin. He reports having consitpaton and has not had a bowel movement in 4 days. He reports he takes miralax as needed.                                 Set-up films were reviewed.                                 The chart was checked. Physical Findings. . .  height is '5\' 11"'$  (1.803 m) and weight is 142 lb 8 oz (64.638 kg). His oral temperature is 97.9 F (36.6 C). His blood pressure is 151/81 and his pulse is 81. His oxygen saturation is 100%. . Weight essentially stable.  No significant changes. The lungs are clear. The heart has a regular rhythm and rate. The abdomen is soft and nontender with normal bowel sounds. The patient has a hernia in the right groin area which is easily reducible.  Impression . . . . . . . The patient is tolerating radiation. Plan . . . . . . . . . . . . Continue treatment as planned.  Information was provided to the patient concerning constipation management. The patient will also be set up to see the nutritionist for his concerns concerning weight loss and which supplements to take in.  ________________________________   Blair Promise, PhD, MD

## 2015-09-26 NOTE — Progress Notes (Addendum)
Christian Burns has completed 1 fraction to his T spine.  He reports having pain in his right lower back and right pelvic area.  He is taking hydrocodone/acetaminophen twice a day. He is concerned because he has a hernia in his right groin.  He reports having consitpaton and has not had a bowel movement in 4 days.  He reports he takes miralax as needed.    BP 151/81 mmHg  Pulse 81  Temp(Src) 97.9 F (36.6 C) (Oral)  Ht 5\' 11"  (1.803 m)  Wt 142 lb 8 oz (64.638 kg)  BMI 19.88 kg/m2  SpO2 100%

## 2015-09-26 NOTE — Progress Notes (Signed)
Pt here for patient teaching.  Pt given Radiation and You booklet. Pt reports they have not watched the Radiation Therapy Education video.  Reviewed areas of pertinence such as fatigue, skin changes and throat changes . Pt able to give teach back of to pat skin and use unscented/gentle soap,avoid applying anything to skin within 4 hours of treatment. Pt demonstrated understanding and verbalizes understanding of information given and will contact nursing with any questions or concerns.     [email or web address]

## 2015-09-26 NOTE — Progress Notes (Signed)
  Radiation Oncology         [phone] ________________________________  Name: Christian Burns MRN: [id number]  Date: 09/26/2015  DOB: 03/06/1930  Simulation Verification Note    ICD-9-CM ICD-10-CM   1. Multiple myeloma in relapse ([place]) 203.02 C90.02     Status: outpatient  NARRATIVE: The patient was brought to the treatment unit and placed in the planned treatment position. The clinical setup was verified. Then port films were obtained and uploaded to the radiation oncology medical record software.  The treatment beams were carefully compared against the planned radiation fields. The position location and shape of the radiation fields was reviewed. They targeted volume of tissue appears to be appropriately covered by the radiation beams. Organs at risk appear to be excluded as planned.  Based on my personal review, I approved the simulation verification. The patient's treatment will proceed as planned.  -----------------------------------  Blair Promise, PhD, MD

## 2015-09-26 NOTE — Progress Notes (Signed)
Constipation Management   Constipation is being unable to move your bowels, having less frequent bowel movements, or having to push harder to move your bowels. In addition, certain medications (i.e. pain medications), eating or drinking less and being less active are possible causes. Keeping your bowel movements regular and easy to pass is important. A daily bowel regimen helps to prevent this potentially troublesome side effect.    What can you do about it?  Diet:  Most adults should consume a diet of 25-35 grams of fiber per day.   Foods high in fiber include:  wheat bran, whole-grain breads/cereals, fruits and vegetables (raw and uncooked with skins and peels on), popcorn and dried beans.   *If you have no appetite or problems chewing or swallowing, have ever been told that you need a low-fiber diet, low-residue diet, these food may not be recommended.    Fluid: It is important for you to stay hydrated.  You can accomplish this by drinking eight, 8oz glasses of fluids a day.  Examples of fluids are water, prune juice, popsicles, gelatin or ice cream.   *The benefits of diet and fluid intake may not be noted for several weeks, so it is important to not discontinue if you don't see immediate results.    Medications (if constipated):  . MIRALAX (polyethylene glycol):  17g powder (one capful) diluted in 8 fluid ounces water, juice, soda or coffee orally once a day and take one (1) Senokot-Sr (Senna-S):  at bedtime.  . If you do not have a bowel movement in the morning, take MIRALAX (polyethylene glycol):  17 g powder (one capful) diluted in 8 fluid ounces water, juice, soda or coffee orally once a day and take one (1) Senokot-Sr (Senna-S):  in the morning and bedtime.   . If you do not have bowel movement by the next morning then take one (1) Liquid Glycerin suppository.   *IF AFTER THIS PROTOCOL YOU STILL HAVEN'T HAD A BOWEL MOVEMENT, PLEASE CALL RADIATION NURSING AT [phone].  Once  you started having bowel movements, continue to take one (1) Senokot-Sr (Senna-S):  once a day as your daily laxative protocol.  If you start having diarrhea, take only one (1) Senokot-Sr (Senna-S): every other day.  Source: American Dietetic Association     

## 2015-09-27 ENCOUNTER — Other Ambulatory Visit: Payer: Self-pay | Admitting: *Deleted

## 2015-09-27 ENCOUNTER — Ambulatory Visit
Admission: RE | Admit: 2015-09-27 | Discharge: 2015-09-27 | Disposition: A | Discharge status: Home / Self Care | Payer: Medicare HMO | Source: Ambulatory Visit | Attending: Radiation Oncology | Admitting: Radiation Oncology

## 2015-09-27 DIAGNOSIS — M899 Disorder of bone, unspecified: Secondary | ICD-10-CM | POA: Diagnosis not present

## 2015-09-27 DIAGNOSIS — C9002 Multiple myeloma in relapse: Secondary | ICD-10-CM | POA: Diagnosis not present

## 2015-09-27 DIAGNOSIS — M25551 Pain in right hip: Secondary | ICD-10-CM

## 2015-09-27 DIAGNOSIS — C9000 Multiple myeloma not having achieved remission: Secondary | ICD-10-CM

## 2015-09-27 DIAGNOSIS — Z510 Encounter for antineoplastic radiation therapy: Secondary | ICD-10-CM | POA: Diagnosis not present

## 2015-09-27 MED ORDER — IXAZOMIB CITRATE 4 MG PO CAPS: 4.0000 mg | ORAL_CAPSULE | ORAL | Status: DC

## 2015-09-28 ENCOUNTER — Ambulatory Visit
Admission: RE | Admit: 2015-09-28 | Discharge: 2015-09-28 | Disposition: A | Discharge status: Home / Self Care | Payer: Medicare HMO | Source: Ambulatory Visit | Attending: Radiation Oncology | Admitting: Radiation Oncology

## 2015-09-28 DIAGNOSIS — M899 Disorder of bone, unspecified: Secondary | ICD-10-CM | POA: Diagnosis not present

## 2015-09-28 DIAGNOSIS — C9000 Multiple myeloma not having achieved remission: Secondary | ICD-10-CM | POA: Diagnosis not present

## 2015-09-28 DIAGNOSIS — Z510 Encounter for antineoplastic radiation therapy: Secondary | ICD-10-CM | POA: Diagnosis not present

## 2015-09-28 DIAGNOSIS — C9002 Multiple myeloma in relapse: Secondary | ICD-10-CM | POA: Diagnosis not present

## 2015-09-29 ENCOUNTER — Other Ambulatory Visit (HOSPITAL_BASED_OUTPATIENT_CLINIC_OR_DEPARTMENT_OTHER): Payer: Medicare HMO

## 2015-09-29 ENCOUNTER — Encounter: Payer: Self-pay | Admitting: Hematology & Oncology

## 2015-09-29 ENCOUNTER — Ambulatory Visit
Admission: RE | Admit: 2015-09-29 | Discharge: 2015-09-29 | Disposition: A | Discharge status: Home / Self Care | Payer: Medicare HMO | Source: Ambulatory Visit | Attending: Radiation Oncology | Admitting: Radiation Oncology

## 2015-09-29 ENCOUNTER — Ambulatory Visit (HOSPITAL_BASED_OUTPATIENT_CLINIC_OR_DEPARTMENT_OTHER): Payer: Medicare HMO

## 2015-09-29 ENCOUNTER — Ambulatory Visit (HOSPITAL_BASED_OUTPATIENT_CLINIC_OR_DEPARTMENT_OTHER): Payer: Medicare HMO | Admitting: Hematology & Oncology

## 2015-09-29 VITALS — BP 154/74 | HR 84 | Temp 97.9°F | Resp 16 | Ht 71.0 in | Wt 142.0 lb

## 2015-09-29 DIAGNOSIS — C9002 Multiple myeloma in relapse: Secondary | ICD-10-CM | POA: Diagnosis not present

## 2015-09-29 DIAGNOSIS — Z510 Encounter for antineoplastic radiation therapy: Secondary | ICD-10-CM | POA: Diagnosis not present

## 2015-09-29 DIAGNOSIS — C9001 Multiple myeloma in remission: Secondary | ICD-10-CM

## 2015-09-29 DIAGNOSIS — C9000 Multiple myeloma not having achieved remission: Secondary | ICD-10-CM

## 2015-09-29 DIAGNOSIS — M899 Disorder of bone, unspecified: Secondary | ICD-10-CM | POA: Diagnosis not present

## 2015-09-29 LAB — CMP (CANCER CENTER ONLY)
ALK PHOS: 57 U/L (ref 26–84)
ALT: 21 U/L (ref 10–47)
AST: 30 U/L (ref 11–38)
Albumin: 3.2 g/dL — ABNORMAL LOW (ref 3.3–5.5)
BILIRUBIN TOTAL: 0.8 mg/dL (ref 0.20–1.60)
BUN, Bld: 20 mg/dL (ref 7–22)
CALCIUM: 12.4 mg/dL — AB (ref 8.0–10.3)
CO2: 29 mEq/L (ref 18–33)
Chloride: 102 mEq/L (ref 98–108)
Creat: 1.4 mg/dl — ABNORMAL HIGH (ref 0.6–1.2)
GLUCOSE: 117 mg/dL (ref 73–118)
Potassium: 4.2 mEq/L (ref 3.3–4.7)
Sodium: 132 mEq/L (ref 128–145)
TOTAL PROTEIN: 9.2 g/dL — AB (ref 6.4–8.1)

## 2015-09-29 LAB — CBC WITH DIFFERENTIAL (CANCER CENTER ONLY)
BASO#: 0 10*3/uL (ref 0.0–0.2)
BASO%: 0.3 % (ref 0.0–2.0)
EOS ABS: 0 10*3/uL (ref 0.0–0.5)
EOS%: 1.3 % (ref 0.0–7.0)
HEMATOCRIT: 29.3 % — AB (ref 38.7–49.9)
HGB: 9.9 g/dL — ABNORMAL LOW (ref 13.0–17.1)
LYMPH#: 0.2 10*3/uL — ABNORMAL LOW (ref 0.9–3.3)
LYMPH%: 7.5 % — AB (ref 14.0–48.0)
MCH: 31 pg (ref 28.0–33.4)
MCHC: 33.8 g/dL (ref 32.0–35.9)
MCV: 92 fL (ref 82–98)
MONO#: 0.6 10*3/uL (ref 0.1–0.9)
MONO%: 18 % — ABNORMAL HIGH (ref 0.0–13.0)
NEUT#: 2.2 10*3/uL (ref 1.5–6.5)
NEUT%: 72.9 % (ref 40.0–80.0)
Platelets: 169 10*3/uL (ref 145–400)
RBC: 3.19 10*6/uL — ABNORMAL LOW (ref 4.20–5.70)
RDW: 14.7 % (ref 11.1–15.7)
WBC: 3.1 10*3/uL — ABNORMAL LOW (ref 4.0–10.0)

## 2015-09-29 LAB — LACTATE DEHYDROGENASE: LDH: 218 U/L (ref 125–245)

## 2015-09-29 MED ORDER — SODIUM CHLORIDE 0.9 % IV SOLN
20.0000 mg | Freq: Once | INTRAVENOUS | Status: AC
  Administered 2015-09-29: 20 mg via INTRAVENOUS
  Filled 2015-09-29: qty 2

## 2015-09-29 MED ORDER — ZOLEDRONIC ACID 4 MG/100ML IV SOLN
4.0000 mg | Freq: Once | INTRAVENOUS | Status: AC
  Administered 2015-09-29: 4 mg via INTRAVENOUS
  Filled 2015-09-29: qty 100

## 2015-09-29 MED ORDER — DEXAMETHASONE 4 MG PO TABS: ORAL_TABLET | ORAL | Status: DC

## 2015-09-29 MED FILL — DEXAMETHASONE 4 MG TABLET: 4 | 90 days supply | Qty: 60 | Fill #0

## 2015-09-29 MED FILL — *CYCLOPHOSPHAMIDE 50 MG CAP: 50 | 21 days supply | Qty: 15 | Fill #1

## 2015-09-29 NOTE — Patient Instructions (Signed)

## 2015-09-30 LAB — IGG, IGA, IGM
IgA, Qn, Serum: 157 mg/dL (ref 61–437)
IgG, Qn, Serum: 3722 mg/dL — ABNORMAL HIGH (ref 700–1600)
IgM, Qn, Serum: 40 mg/dL (ref 15–143)

## 2015-10-02 ENCOUNTER — Ambulatory Visit
Admission: RE | Admit: 2015-10-02 | Discharge: 2015-10-02 | Disposition: A | Discharge status: Home / Self Care | Payer: Medicare HMO | Source: Ambulatory Visit | Attending: Radiation Oncology | Admitting: Radiation Oncology

## 2015-10-02 ENCOUNTER — Encounter: Payer: Self-pay | Admitting: Hematology & Oncology

## 2015-10-02 DIAGNOSIS — M899 Disorder of bone, unspecified: Secondary | ICD-10-CM | POA: Diagnosis not present

## 2015-10-02 DIAGNOSIS — K4090 Unilateral inguinal hernia, without obstruction or gangrene, not specified as recurrent: Secondary | ICD-10-CM | POA: Diagnosis not present

## 2015-10-02 DIAGNOSIS — C9002 Multiple myeloma in relapse: Secondary | ICD-10-CM | POA: Diagnosis not present

## 2015-10-02 DIAGNOSIS — Z510 Encounter for antineoplastic radiation therapy: Secondary | ICD-10-CM | POA: Diagnosis not present

## 2015-10-02 DIAGNOSIS — C9000 Multiple myeloma not having achieved remission: Secondary | ICD-10-CM | POA: Diagnosis not present

## 2015-10-02 HISTORY — DX: Hypercalcemia: E83.52

## 2015-10-02 LAB — KAPPA/LAMBDA LIGHT CHAINS
Ig Kappa Free Light Chain: 19.56 mg/L — ABNORMAL HIGH (ref 3.30–19.40)
Ig Lambda Free Light Chain: 1007.21 mg/L — ABNORMAL HIGH (ref 5.71–26.30)
KAPPA/LAMBDA FLC RATIO: 0.02 — AB (ref 0.26–1.65)

## 2015-10-02 NOTE — Progress Notes (Signed)
Hematology and Oncology Follow Up Visit  DENON SCHWABE [id number] 01/11/1930 82 y.o. 10/02/2015   Principle Diagnosis:   Recurrent IgG lambda myeloma - progressive  Hypercalcemia of malignancy.  Current Therapy:    Cytoxan 250mg  po q wk (3/1)/Ixazomib 4mg  po q week (3/1) - s/p cycle #1  Zometa 3.3 mg IV q. 4 weeks  Palliative radiation therapy to T 12/cytoma     Interim History:  Mr.  Endrizzi is back for followup. He clearly has progressive myeloma. After last saw him back in January, we did do an MRI of his back. 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 This was causing some nerve compression causing his symptoms. He now is seeing radiation oncology. He started radiation therapy.   His Cytoxan/Ninlaro has been doing okay for him. I disrupt that he's been doing this the right way .  His son comes in with him. I'll know if there is been some issues with him when he's to be taken the medications.  I wanted to try to keep him on an oral regimen given that his wife is not doing well and he really wants try to help her out.  Unfortunately, he is losing more weight. This typically is a ominous sign.  His pain seems redoing a little better. I would like to think that the radiation therapy will help the pain.  He's had no problems with bowels or bladder. He's had no leg swelling.  Overall, his performance status is ECOG 2-3.  Medications:  Current outpatient prescriptions:  .  aspirin EC 81 MG tablet, Take 81 mg by mouth daily. , Disp: , Rfl:  .  Calcium Carb-Cholecalciferol (CALCIUM 600 + D PO), Take by mouth every morning., Disp: , Rfl:  .  Cyanocobalamin (VITAMIN B 12 PO), Take 1,000 mcg by mouth every morning. , Disp: , Rfl:  .  cyclophosphamide (CYTOXAN) 50 MG tablet, Take 5 capsules on an empty stomach once a week for 3 weeks, then off 1 week, Disp: 15 tablet, Rfl: 6 .  HYDROcodone-acetaminophen (NORCO/VICODIN) 5-325 MG tablet, Take 1 tablet by mouth  every 6 (six) hours as needed for moderate pain., Disp: 60 tablet, Rfl: 0 .  ixazomib citrate (NINLARO) 4 MG capsule, Take 1 capsule (4 mg total) by mouth See admin instructions. Take on empty stomach 1hr before food. Day #1, # 8, #15, then day # 28 repeat., Disp: 3 capsule, Rfl: 2 .  Multiple Vitamins-Minerals (MULTIVITAL PO), Take 1 tablet by mouth every morning. , Disp: , Rfl:  .  polyethylene glycol (MIRALAX / GLYCOLAX) packet, Take 17 g by mouth daily as needed. Reported on 09/26/2015, Disp: , Rfl:  .  Pyridoxine HCl (VITAMIN B-6) 250 MG tablet, Take 500 mg by mouth daily., Disp: , Rfl:  .  Sennosides-Docusate Sodium (SENNA S PO), TK 1 T PO UP TO BID PRF CONSTIPATION, Disp: , Rfl: 12 .  Vitamin D, Ergocalciferol, (DRISDOL) 50000 UNITS CAPS capsule, Take 50,000 Units by mouth every 7 (seven) days. Sundays, Disp: , Rfl:  .  dexamethasone (DECADRON) 4 MG tablet, Take 5 pills, WITH FOOD, once a week with your chemotherapy pills., Disp: 60 tablet, Rfl: 3  Allergies:  Allergies  Allergen Reactions  . Sulfa Antibiotics Itching    Past Medical History, Surgical history, Social history, and Family History were reviewed and updated.  Review of Systems: As above  Physical Exam:  height is 5\' 11"  (1.803 m) and weight is  142 lb (64.411 kg). His oral temperature is 97.9 F (36.6 C). His blood pressure is 154/74 and his pulse is 84. His respiration is 16.   Thin, elderly gentleman. He is no distress. Head and neck exam shows no ocular or oral lesions. He has no palpable cervical or supraclavicular lymph nodes. Lungs are clear bilaterally. Cardiac exam is regular rate and rhythm with no murmurs rubs or bruits. Abdomen is soft. He has good bowel sounds. There is no fluid wave. There is no palpable liver or spleen. Back exam no tenderness over the spine ribs or hips. He has some kyphosis. Extremities shows some age related changes. He has good range of motion and strength. Skin exam shows no rashes.  Neurological exam is nonfocal.  Lab Results  Component Value Date   WBC 3.1* 09/29/2015   HGB 9.9* 09/29/2015   HCT 29.3* 09/29/2015   MCV 92 09/29/2015   PLT 169 09/29/2015     Chemistry      Component Value Date/Time   NA 132 09/29/2015 1105   NA 140 07/15/2014 1345   K 4.2 09/29/2015 1105   K 4.6 07/15/2014 1345   CL 102 09/29/2015 1105   CL 106 07/15/2014 1345   CO2 29 09/29/2015 1105   CO2 27 07/15/2014 1345   BUN 20 09/29/2015 1105   BUN 18 07/15/2014 1345   CREATININE 1.4* 09/29/2015 1105   CREATININE 1.41* 07/15/2014 1345      Component Value Date/Time   CALCIUM 12.4* 09/29/2015 1105   CALCIUM 8.7 07/15/2014 1345   ALKPHOS 57 09/29/2015 1105   ALKPHOS 101 07/15/2014 1345   AST 30 09/29/2015 1105   AST 18 07/15/2014 1345   ALT 21 09/29/2015 1105   ALT 15 07/15/2014 1345   BILITOT 0.80 09/29/2015 1105   BILITOT 1.3* 07/15/2014 1345       Impression and Plan: Mr. Alsop is an 82 year old gentleman with IgG lambda myeloma.   Hopefully, the switch to Cytoxan/Ninlaro will help. Again it might be limited to early frosted no have much things will work. I want to really try to keep him on an all oral regimen.  If, for some reason, he does not respond, then I think our option would be to try him on Kyprolis. We also might consider Darzalex.  The hypercalcemia also is quite concerning. This we will have to treat aggressively. He will get full dose of Zometa for this.  Hopefully, we'll start a see a response this month.  We gave him a calendar so that he knows when to take his medications.   This will really become a huge problem if he does not respond. Again, his wife really is dependent upon him. He is trying his best to help her out.   Needless to say, this was a pretty incredibly complex situation.  I spent about 45 minutes with him today.   I want to try to get him back in another 3 weeks. [name], MD 2/20/20176:35 PM

## 2015-10-03 ENCOUNTER — Ambulatory Visit: Payer: Medicare HMO | Admitting: Nutrition

## 2015-10-03 ENCOUNTER — Encounter: Payer: Medicare HMO | Admitting: Nutrition

## 2015-10-03 ENCOUNTER — Ambulatory Visit
Admission: RE | Admit: 2015-10-03 | Discharge: 2015-10-03 | Disposition: A | Discharge status: Home / Self Care | Payer: Medicare HMO | Source: Ambulatory Visit | Attending: Radiation Oncology | Admitting: Radiation Oncology

## 2015-10-03 ENCOUNTER — Ambulatory Visit: Payer: Medicare HMO

## 2015-10-03 DIAGNOSIS — C9002 Multiple myeloma in relapse: Secondary | ICD-10-CM

## 2015-10-03 DIAGNOSIS — C9000 Multiple myeloma not having achieved remission: Secondary | ICD-10-CM | POA: Diagnosis not present

## 2015-10-03 DIAGNOSIS — Z510 Encounter for antineoplastic radiation therapy: Secondary | ICD-10-CM | POA: Diagnosis not present

## 2015-10-03 DIAGNOSIS — M899 Disorder of bone, unspecified: Secondary | ICD-10-CM | POA: Diagnosis not present

## 2015-10-03 LAB — PROTEIN ELECTROPHORESIS, SERUM, WITH REFLEX
A/G RATIO SPE: 0.7 (ref 0.7–1.7)
ALPHA 2: 0.8 g/dL (ref 0.4–1.0)
Albumin: 3.4 g/dL (ref 2.9–4.4)
Alpha 1: 0.3 g/dL (ref 0.0–0.4)
BETA: 0.9 g/dL (ref 0.7–1.3)
GAMMA GLOBULIN: 3 g/dL — AB (ref 0.4–1.8)
GLOBULIN, TOTAL: 5 g/dL — AB (ref 2.2–3.9)
Interpretation(See Below): 0
M-Spike, %: 2.6 g/dL — ABNORMAL HIGH
TOTAL PROTEIN: 8.4 g/dL (ref 6.0–8.5)

## 2015-10-03 NOTE — Progress Notes (Signed)
  Radiation Oncology         [phone] ________________________________  Name: Christian Burns MRN: [id number]  Date: 10/03/2015  DOB: 10/04/1929  SIMULATION AND TREATMENT PLANNING NOTE   DIAGNOSIS: Multiple Myeloma  NARRATIVE:  The patient was brought to the [place] suite.  Identity was confirmed.  All relevant records and images related to the planned course of therapy were reviewed.  The patient freely provided informed written consent to proceed with treatment after reviewing the details related to the planned course of therapy. The consent form was witnessed and verified by the simulation staff.  Then, the patient was set-up in a stable reproducible  supine position for radiation therapy.  CT images were obtained.  Surface markings were placed.  The CT images were loaded into the planning software.  Then the target and avoidance structures were contoured.  Treatment planning then occurred.  The radiation prescription was entered and confirmed.  Then, I designed and supervised the construction of a total of 2 medically necessary complex treatment devices.  I have requested : Intensity Modulated Radiotherapy (IMRT) is medically necessary for this case for the following reason:  Previous treatment to this area..  I have ordered:dose calc  PLAN:  The patient will receive 20 Gy in 10 fractions.  ________________________________  Blair Promise, PhD, MD  This document serves as a record of services personally performed by Gery Pray, MD. It was created on his behalf by Darcus Austin, a trained medical scribe. The creation of this record is based on the scribe's personal observations and the provider's statements to them. This document has been checked and approved by the attending provider.

## 2015-10-03 NOTE — Progress Notes (Signed)
82 year old male diagnosed with multiple myeloma.  He is a patient of Dr. Marin Olp and Dr. Sondra Come.  Past medical history was reviewed and is not significant.  Medications include calcium plus vitamin D, vitamin B12, Cytoxan, Decadron, multivitamin, MiraLAX, vitamin B6, senna, and vitamin D.  Labs include albumin 3.2.  Height: 5 feet 11 inches. Weight: 140.4 pounds. Usual body weight: 154 pounds in December 2016. BMI: 19.58.  Patient reports he has a poor appetite and food just does not taste good to him. He denies nausea. He has a history of constipation but that has improved. Dietary recall reveals patient does consume 3 meals daily but does not typically snack. Patient has been consuming [employer] breakfast essentials occasionally.  Nutrition diagnosis:  Unintended weight loss related to inadequate oral intake as evidenced by 9% weight loss over 2 months.  Intervention:  Patient educated to increase calories and protein at 3 meals and add 3 snacks daily. Reviewed high-calorie high-protein fact sheet Encouraged patient to switch from low-fat milk to whole milk and drink at mealtimes. Encouraged oral nutrition supplements between meals and provided samples and recipes. Questions were answered.  Teach back method used.  Contact information was given.  Monitoring, evaluation, goals: Patient will tolerate adequate calories and protein to minimize weight loss.  Next visit: Patient will contact me for questions or concerns.  **Disclaimer: This note was dictated with voice recognition software. Similar sounding words can inadvertently be transcribed and this note may contain transcription errors which may not have been corrected upon publication of note.**

## 2015-10-04 ENCOUNTER — Ambulatory Visit
Admission: RE | Admit: 2015-10-04 | Discharge: 2015-10-04 | Disposition: A | Discharge status: Home / Self Care | Payer: Medicare HMO | Source: Ambulatory Visit | Attending: Radiation Oncology | Admitting: Radiation Oncology

## 2015-10-04 ENCOUNTER — Ambulatory Visit: Payer: Medicare HMO

## 2015-10-05 ENCOUNTER — Ambulatory Visit: Payer: Medicare HMO

## 2015-10-05 DIAGNOSIS — C9000 Multiple myeloma not having achieved remission: Secondary | ICD-10-CM | POA: Diagnosis not present

## 2015-10-05 DIAGNOSIS — C9002 Multiple myeloma in relapse: Secondary | ICD-10-CM | POA: Diagnosis not present

## 2015-10-05 DIAGNOSIS — Z510 Encounter for antineoplastic radiation therapy: Secondary | ICD-10-CM | POA: Diagnosis not present

## 2015-10-05 DIAGNOSIS — M899 Disorder of bone, unspecified: Secondary | ICD-10-CM | POA: Diagnosis not present

## 2015-10-06 ENCOUNTER — Other Ambulatory Visit: Payer: Self-pay | Admitting: *Deleted

## 2015-10-06 ENCOUNTER — Telehealth: Payer: Self-pay | Admitting: Oncology

## 2015-10-06 ENCOUNTER — Ambulatory Visit: Payer: Medicare HMO

## 2015-10-06 DIAGNOSIS — C9002 Multiple myeloma in relapse: Secondary | ICD-10-CM

## 2015-10-06 MED ORDER — HYDROCODONE-ACETAMINOPHEN 5-325 MG PO TABS: 1.0000 | ORAL_TABLET | Freq: Four times a day (QID) | ORAL | Status: DC | PRN

## 2015-10-06 NOTE — Telephone Encounter (Signed)
Christian Burns called and asked if he is OK to drive.  He said he has been driving and does not think his medications would effect how he drives.  Advised him that he should be able to drive as long as he is not feeling dizzy, has trouble seeing or his legs are numb.  [name] verbalized agreement and understanding and also said that he has family that can provide transportation if needed.

## 2015-10-09 ENCOUNTER — Ambulatory Visit
Admission: RE | Admit: 2015-10-09 | Discharge: 2015-10-09 | Disposition: A | Discharge status: Home / Self Care | Payer: Medicare HMO | Source: Ambulatory Visit | Attending: Radiation Oncology | Admitting: Radiation Oncology

## 2015-10-09 DIAGNOSIS — M899 Disorder of bone, unspecified: Secondary | ICD-10-CM | POA: Diagnosis not present

## 2015-10-09 DIAGNOSIS — Z510 Encounter for antineoplastic radiation therapy: Secondary | ICD-10-CM | POA: Diagnosis not present

## 2015-10-09 DIAGNOSIS — C9000 Multiple myeloma not having achieved remission: Secondary | ICD-10-CM | POA: Diagnosis not present

## 2015-10-09 DIAGNOSIS — C9002 Multiple myeloma in relapse: Secondary | ICD-10-CM | POA: Diagnosis not present

## 2015-10-10 ENCOUNTER — Encounter: Payer: Self-pay | Admitting: Radiation Oncology

## 2015-10-10 ENCOUNTER — Ambulatory Visit
Admission: RE | Admit: 2015-10-10 | Discharge: 2015-10-10 | Disposition: A | Discharge status: Home / Self Care | Payer: Medicare HMO | Source: Ambulatory Visit | Attending: Radiation Oncology | Admitting: Radiation Oncology

## 2015-10-10 ENCOUNTER — Ambulatory Visit: Payer: Medicare HMO | Admitting: Radiation Oncology

## 2015-10-10 VITALS — BP 149/64 | HR 82 | Temp 98.2°F | Ht 71.0 in | Wt 138.9 lb

## 2015-10-10 DIAGNOSIS — C9000 Multiple myeloma not having achieved remission: Secondary | ICD-10-CM | POA: Diagnosis not present

## 2015-10-10 DIAGNOSIS — Z510 Encounter for antineoplastic radiation therapy: Secondary | ICD-10-CM | POA: Diagnosis not present

## 2015-10-10 DIAGNOSIS — M899 Disorder of bone, unspecified: Secondary | ICD-10-CM | POA: Diagnosis not present

## 2015-10-10 DIAGNOSIS — C9002 Multiple myeloma in relapse: Secondary | ICD-10-CM

## 2015-10-10 NOTE — Progress Notes (Signed)
Christian Burns has completed 7 fractions to his T spine.  He continues to have pain in his right hip that he is rating at a 3/10.  He takes norco once before bed.  He reports having occasional nausea and a poor appetite.  He has lost 2 lbs since 10/03/15.  He continues to have numbness in his legs.  He denies having trouble walking.  He denies having trouble swallowing or a sore throat.  He does report that he takes a long time to swallow now. He will be taking decadron with his chemo medication. He reports having fatigue.  BP 149/64 mmHg  Pulse 82  Temp(Src) 98.2 F (36.8 C) (Oral)  Ht 5\' 11"  (1.803 m)  Wt 138 lb 14.4 oz (63.005 kg)  BMI 19.38 kg/m2  SpO2 100%   Wt Readings from Last 3 Encounters:  10/10/15 138 lb 14.4 oz (63.005 kg)  10/03/15 140 lb 6.4 oz (63.685 kg)  09/29/15 142 lb (64.411 kg)

## 2015-10-10 NOTE — Progress Notes (Signed)
  Radiation Oncology         (336) [phone] ________________________________  Name: Christian Burns MRN: [id number]  Date: 10/10/2015  DOB: 04/09/1930  Weekly Radiation Therapy Management    ICD-9-CM ICD-10-CM   1. Multiple myeloma in relapse ([place]) 203.02 C90.02     Current Dose: 14 Gy     Planned Dose: 30   Gy  Narrative . . . . . . . . The patient presents for routine under treatment assessment.                                  [name] has completed 7 fractions to his T spine. He continues to have pain in his right hip that he is rating at a 3/10. He takes norco once before bed. He reports having occasional nausea and a poor appetite. He has lost 2 lbs since 10/03/15. He continues to have numbness in his legs. He denies having trouble walking. He denies having trouble swallowing or a sore throat. He does report that he takes a long time to swallow now. He will be taking decadron with his chemo medication. He reports having fatigue.                                  Set-up films were reviewed.                                 The chart was checked. Physical Findings. . .  height is '5\' 11"'$  (1.803 m) and weight is 138 lb 14.4 oz (63.005 kg). His oral temperature is 98.2 F (36.8 C). His blood pressure is 149/64 and his pulse is 82. His oxygen saturation is 100%. .  4 pound weight loss since start of treatment. No significant changes. The lungs are clear. The heart has a regular rhythm and rate.  The abdomen is soft and nontender with normal bowel sounds. Impression . . . . . . . The patient is tolerating radiation. Plan . . . . . . . . . . . . Continue treatment as planned.  _______________________________   Blair Promise, PhD, MD

## 2015-10-11 ENCOUNTER — Ambulatory Visit
Admission: RE | Admit: 2015-10-11 | Discharge: 2015-10-11 | Disposition: A | Discharge status: Home / Self Care | Payer: Medicare HMO | Source: Ambulatory Visit | Attending: Radiation Oncology | Admitting: Radiation Oncology

## 2015-10-11 DIAGNOSIS — C9002 Multiple myeloma in relapse: Secondary | ICD-10-CM | POA: Diagnosis not present

## 2015-10-11 DIAGNOSIS — C9000 Multiple myeloma not having achieved remission: Secondary | ICD-10-CM | POA: Diagnosis not present

## 2015-10-11 DIAGNOSIS — Z510 Encounter for antineoplastic radiation therapy: Secondary | ICD-10-CM | POA: Diagnosis not present

## 2015-10-11 DIAGNOSIS — M899 Disorder of bone, unspecified: Secondary | ICD-10-CM | POA: Diagnosis not present

## 2015-10-12 ENCOUNTER — Ambulatory Visit
Admission: RE | Admit: 2015-10-12 | Discharge: 2015-10-12 | Disposition: A | Discharge status: Home / Self Care | Payer: Medicare HMO | Source: Ambulatory Visit | Attending: Radiation Oncology | Admitting: Radiation Oncology

## 2015-10-12 DIAGNOSIS — C9000 Multiple myeloma not having achieved remission: Secondary | ICD-10-CM | POA: Diagnosis not present

## 2015-10-12 DIAGNOSIS — C9002 Multiple myeloma in relapse: Secondary | ICD-10-CM | POA: Diagnosis not present

## 2015-10-12 DIAGNOSIS — Z510 Encounter for antineoplastic radiation therapy: Secondary | ICD-10-CM | POA: Diagnosis not present

## 2015-10-12 DIAGNOSIS — M899 Disorder of bone, unspecified: Secondary | ICD-10-CM | POA: Diagnosis not present

## 2015-10-13 ENCOUNTER — Ambulatory Visit: Payer: Medicare HMO

## 2015-10-13 ENCOUNTER — Ambulatory Visit
Admission: RE | Admit: 2015-10-13 | Discharge: 2015-10-13 | Disposition: A | Discharge status: Home / Self Care | Payer: Medicare HMO | Source: Ambulatory Visit | Attending: Radiation Oncology | Admitting: Radiation Oncology

## 2015-10-13 DIAGNOSIS — M899 Disorder of bone, unspecified: Secondary | ICD-10-CM | POA: Diagnosis not present

## 2015-10-13 DIAGNOSIS — Z510 Encounter for antineoplastic radiation therapy: Secondary | ICD-10-CM | POA: Diagnosis not present

## 2015-10-13 DIAGNOSIS — C9002 Multiple myeloma in relapse: Secondary | ICD-10-CM | POA: Diagnosis not present

## 2015-10-13 DIAGNOSIS — C9000 Multiple myeloma not having achieved remission: Secondary | ICD-10-CM | POA: Diagnosis not present

## 2015-10-16 ENCOUNTER — Ambulatory Visit: Payer: Medicare HMO

## 2015-10-16 ENCOUNTER — Ambulatory Visit
Admission: RE | Admit: 2015-10-16 | Discharge: 2015-10-16 | Disposition: A | Discharge status: Home / Self Care | Payer: Medicare HMO | Source: Ambulatory Visit | Attending: Radiation Oncology | Admitting: Radiation Oncology

## 2015-10-16 DIAGNOSIS — C9000 Multiple myeloma not having achieved remission: Secondary | ICD-10-CM | POA: Diagnosis not present

## 2015-10-16 DIAGNOSIS — Z510 Encounter for antineoplastic radiation therapy: Secondary | ICD-10-CM | POA: Diagnosis not present

## 2015-10-16 DIAGNOSIS — C9002 Multiple myeloma in relapse: Secondary | ICD-10-CM | POA: Diagnosis not present

## 2015-10-16 DIAGNOSIS — M899 Disorder of bone, unspecified: Secondary | ICD-10-CM | POA: Diagnosis not present

## 2015-10-17 ENCOUNTER — Ambulatory Visit
Admission: RE | Admit: 2015-10-17 | Discharge: 2015-10-17 | Disposition: A | Discharge status: Home / Self Care | Payer: Medicare HMO | Source: Ambulatory Visit | Attending: Radiation Oncology | Admitting: Radiation Oncology

## 2015-10-17 ENCOUNTER — Encounter: Payer: Self-pay | Admitting: Radiation Oncology

## 2015-10-17 ENCOUNTER — Ambulatory Visit: Payer: Medicare HMO

## 2015-10-17 VITALS — BP 154/72 | HR 79 | Temp 97.7°F | Resp 16 | Ht 71.0 in | Wt 140.2 lb

## 2015-10-17 DIAGNOSIS — C9002 Multiple myeloma in relapse: Secondary | ICD-10-CM | POA: Diagnosis not present

## 2015-10-17 DIAGNOSIS — C9000 Multiple myeloma not having achieved remission: Secondary | ICD-10-CM | POA: Diagnosis not present

## 2015-10-17 DIAGNOSIS — M899 Disorder of bone, unspecified: Secondary | ICD-10-CM | POA: Diagnosis not present

## 2015-10-17 DIAGNOSIS — Z510 Encounter for antineoplastic radiation therapy: Secondary | ICD-10-CM | POA: Diagnosis not present

## 2015-10-17 NOTE — Progress Notes (Signed)
  Radiation Oncology         (336) [phone] ________________________________  Name: Christian Burns MRN: [id number]  Date: 10/17/2015  DOB: Aug 15, 1929  Weekly Radiation Therapy Management    ICD-9-CM ICD-10-CM   1. Multiple myeloma in relapse ([place]) 203.02 C90.02     Current Dose: 24 Gy     Planned Dose:  30 Gy  Narrative . . . . . . . . The patient presents for routine under treatment assessment.                                    He reports the pain in his right side is better. He is rating it at a 3/10 today. He takes 1 norco at night. He reports his appetite is OK and is drinking 2 ensure per day. His weight is up 2 lbs today. He denies having a sore throat or trouble swallowing. He reports the numbness in his legs is the same. He reports having more fatigue.  No difficulty walking. Has some queasiness but does not wish to start any medicine for this issue                                 Set-up films were reviewed.                                 The chart was checked. Physical Findings. . .  height is _0  (1.803 m) and weight is 140 lb 3.2 oz (63.594 kg). His oral temperature is 97.7 F (36.5 C). His blood pressure is 154/72 and his pulse is 79. His respiration is 16 and oxygen saturation is 100%. . The lungs are clear. The heart has a regular rhythm and rate. The abdomen is soft and nontender with normal bowel sounds. Examination of the back area reveals no significant radiation reaction. Impression . . . . . . . The patient is tolerating radiation. Plan . . . . . . . . . . . . Continue treatment as planned.  ________________________________   Blair Promise, PhD, MD

## 2015-10-17 NOTE — Progress Notes (Signed)
Christian Burns has completed 12 fractions to his T-Spine.  He reports the pain in his right side is better.  He is rating it at a 3/10 today.  He takes 1 norco at night.  He reports his appetite is OK and is drinking 2 ensure per day.  His weight is up 2 lbs today.  He denies having a sore throat or trouble swallowing.  He reports the numbness in his legs is the same.  He reports having more fatigue.  He denies having any skin irritation.  He will be given a one month follow up appointment.  BP 154/72 mmHg  Pulse 79  Temp(Src) 97.7 F (36.5 C) (Oral)  Resp 16  Ht 5\' 11"  (1.803 m)  Wt 140 lb 3.2 oz (63.594 kg)  BMI 19.56 kg/m2  SpO2 100%   Wt Readings from Last 3 Encounters:  10/17/15 140 lb 3.2 oz (63.594 kg)  10/10/15 138 lb 14.4 oz (63.005 kg)  10/03/15 140 lb 6.4 oz (63.685 kg)

## 2015-10-18 ENCOUNTER — Ambulatory Visit
Admission: RE | Admit: 2015-10-18 | Discharge: 2015-10-18 | Disposition: A | Discharge status: Home / Self Care | Payer: Medicare HMO | Source: Ambulatory Visit | Attending: Radiation Oncology | Admitting: Radiation Oncology

## 2015-10-18 ENCOUNTER — Telehealth: Payer: Self-pay | Admitting: Radiation Oncology

## 2015-10-18 DIAGNOSIS — M899 Disorder of bone, unspecified: Secondary | ICD-10-CM | POA: Diagnosis not present

## 2015-10-18 DIAGNOSIS — C9000 Multiple myeloma not having achieved remission: Secondary | ICD-10-CM | POA: Diagnosis not present

## 2015-10-18 DIAGNOSIS — C9002 Multiple myeloma in relapse: Secondary | ICD-10-CM | POA: Diagnosis not present

## 2015-10-18 DIAGNOSIS — Z510 Encounter for antineoplastic radiation therapy: Secondary | ICD-10-CM | POA: Diagnosis not present

## 2015-10-18 NOTE — Telephone Encounter (Signed)
Patient left message requesting return call.  

## 2015-10-18 NOTE — Telephone Encounter (Signed)
Phoned patient back. Patient questions,"how long is it after you eat before your stomach is empty?" Explained it is reasonable to assume that 1-2 hours after eating your stomach is empty. Patient verbalized understanding and expressed appreciation for the call.

## 2015-10-19 ENCOUNTER — Ambulatory Visit
Admission: RE | Admit: 2015-10-19 | Discharge: 2015-10-19 | Disposition: A | Discharge status: Home / Self Care | Payer: Medicare HMO | Source: Ambulatory Visit | Attending: Radiation Oncology | Admitting: Radiation Oncology

## 2015-10-19 DIAGNOSIS — C9000 Multiple myeloma not having achieved remission: Secondary | ICD-10-CM | POA: Diagnosis not present

## 2015-10-19 DIAGNOSIS — C9002 Multiple myeloma in relapse: Secondary | ICD-10-CM | POA: Diagnosis not present

## 2015-10-19 DIAGNOSIS — M899 Disorder of bone, unspecified: Secondary | ICD-10-CM | POA: Diagnosis not present

## 2015-10-19 DIAGNOSIS — Z510 Encounter for antineoplastic radiation therapy: Secondary | ICD-10-CM | POA: Diagnosis not present

## 2015-10-20 ENCOUNTER — Ambulatory Visit
Admission: RE | Admit: 2015-10-20 | Discharge: 2015-10-20 | Disposition: A | Discharge status: Home / Self Care | Payer: Medicare HMO | Source: Ambulatory Visit | Attending: Radiation Oncology | Admitting: Radiation Oncology

## 2015-10-20 DIAGNOSIS — C9002 Multiple myeloma in relapse: Secondary | ICD-10-CM | POA: Diagnosis not present

## 2015-10-20 DIAGNOSIS — Z510 Encounter for antineoplastic radiation therapy: Secondary | ICD-10-CM | POA: Diagnosis not present

## 2015-10-20 DIAGNOSIS — C9000 Multiple myeloma not having achieved remission: Secondary | ICD-10-CM | POA: Diagnosis not present

## 2015-10-20 DIAGNOSIS — M899 Disorder of bone, unspecified: Secondary | ICD-10-CM | POA: Diagnosis not present

## 2015-10-23 MED FILL — *CYCLOPHOSPHAMIDE 50 MG CAP: 50 | 21 days supply | Qty: 15 | Fill #2

## 2015-10-27 ENCOUNTER — Other Ambulatory Visit (HOSPITAL_BASED_OUTPATIENT_CLINIC_OR_DEPARTMENT_OTHER): Payer: Medicare HMO

## 2015-10-27 ENCOUNTER — Encounter: Payer: Self-pay | Admitting: Hematology & Oncology

## 2015-10-27 ENCOUNTER — Ambulatory Visit (HOSPITAL_BASED_OUTPATIENT_CLINIC_OR_DEPARTMENT_OTHER): Payer: Medicare HMO

## 2015-10-27 ENCOUNTER — Ambulatory Visit (HOSPITAL_BASED_OUTPATIENT_CLINIC_OR_DEPARTMENT_OTHER): Payer: Medicare HMO | Admitting: Hematology & Oncology

## 2015-10-27 VITALS — BP 143/72 | HR 76 | Temp 97.4°F | Resp 16 | Ht 71.0 in | Wt 138.0 lb

## 2015-10-27 DIAGNOSIS — C9002 Multiple myeloma in relapse: Secondary | ICD-10-CM

## 2015-10-27 DIAGNOSIS — C9000 Multiple myeloma not having achieved remission: Secondary | ICD-10-CM

## 2015-10-27 DIAGNOSIS — R638 Other symptoms and signs concerning food and fluid intake: Secondary | ICD-10-CM

## 2015-10-27 DIAGNOSIS — C9001 Multiple myeloma in remission: Secondary | ICD-10-CM | POA: Diagnosis not present

## 2015-10-27 DIAGNOSIS — E342 Ectopic hormone secretion, not elsewhere classified: Secondary | ICD-10-CM | POA: Diagnosis not present

## 2015-10-27 DIAGNOSIS — R634 Abnormal weight loss: Secondary | ICD-10-CM

## 2015-10-27 DIAGNOSIS — D649 Anemia, unspecified: Secondary | ICD-10-CM | POA: Diagnosis not present

## 2015-10-27 LAB — CMP (CANCER CENTER ONLY)
ALK PHOS: 85 U/L — AB (ref 26–84)
ALT: 23 U/L (ref 10–47)
AST: 29 U/L (ref 11–38)
Albumin: 2.8 g/dL — ABNORMAL LOW (ref 3.3–5.5)
BUN: 35 mg/dL — AB (ref 7–22)
CO2: 26 mEq/L (ref 18–33)
Calcium: 10.1 mg/dL (ref 8.0–10.3)
Chloride: 105 mEq/L (ref 98–108)
Creat: 1.8 mg/dl — ABNORMAL HIGH (ref 0.6–1.2)
GLUCOSE: 120 mg/dL — AB (ref 73–118)
Potassium: 3.9 mEq/L (ref 3.3–4.7)
SODIUM: 139 meq/L (ref 128–145)
TOTAL PROTEIN: 8.7 g/dL — AB (ref 6.4–8.1)
Total Bilirubin: 0.8 mg/dl (ref 0.20–1.60)

## 2015-10-27 LAB — CBC WITH DIFFERENTIAL (CANCER CENTER ONLY)
BASO#: 0 10*3/uL (ref 0.0–0.2)
BASO%: 0.4 % (ref 0.0–2.0)
EOS%: 1 % (ref 0.0–7.0)
Eosinophils Absolute: 0.1 10*3/uL (ref 0.0–0.5)
HCT: 25.5 % — ABNORMAL LOW (ref 38.7–49.9)
HGB: 8.7 g/dL — ABNORMAL LOW (ref 13.0–17.1)
LYMPH#: 0.2 10*3/uL — ABNORMAL LOW (ref 0.9–3.3)
LYMPH%: 3 % — AB (ref 14.0–48.0)
MCH: 32.5 pg (ref 28.0–33.4)
MCHC: 34.1 g/dL (ref 32.0–35.9)
MCV: 95 fL (ref 82–98)
MONO#: 0.7 10*3/uL (ref 0.1–0.9)
MONO%: 14.6 % — AB (ref 0.0–13.0)
NEUT#: 4.1 10*3/uL (ref 1.5–6.5)
NEUT%: 81 % — AB (ref 40.0–80.0)
PLATELETS: 101 10*3/uL — AB (ref 145–400)
RBC: 2.68 10*6/uL — ABNORMAL LOW (ref 4.20–5.70)
RDW: 16.8 % — AB (ref 11.1–15.7)
WBC: 5 10*3/uL (ref 4.0–10.0)

## 2015-10-27 MED ORDER — SODIUM CHLORIDE 0.9 % IV SOLN
INTRAVENOUS | Status: AC
  Administered 2015-10-27: 12:00:00 via INTRAVENOUS

## 2015-10-27 NOTE — Progress Notes (Signed)
Hematology and Oncology Follow Up Visit  ELESTER DUNMAN [id number] Sep 01, 1929 82 y.o. 10/27/2015   Principle Diagnosis:   Recurrent IgG lambda myeloma - progressive  Hypercalcemia of malignancy.  Current Therapy:    Cytoxan 250mg  po q wk (3/1)/Ixazomib 4mg  po q week (3/1) - s/p cycle #2  Zometa 3.3 mg IV q. 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  Ricketson is back for followup. He looks a little bit better. His weight is down a little bit. However, he says that he feels better. He completed his radiation therapy to T12. He received 30 Gy. I think he completed this about one week or so ago. He says the pain in the right side is much better. There is no pain in his right leg.  He now is on the Cytoxan/ Ninlaro combination. He also takes Decadron with the Ninlaro.  Back in February, his M spike was 2.6 g/dL. His IgG level was 3722 mg/dL. Unfortunately with the new system, I cannot find his light chain report.  He says his appetite is getting better. Comes in with his daughter-in-law. She confirms this.  He does seem a low bit dehydrated today.  He and his wife are moving into assisted living. I think this is a very good idea. I filled out some forms for him.  Overall, his performance status is ECOG 2-3.  Medications:  Current outpatient prescriptions:  .  aspirin EC 81 MG tablet, Take 81 mg by mouth daily. , Disp: , Rfl:  .  Calcium Carb-Cholecalciferol (CALCIUM 600 + D PO), Take by mouth every morning., Disp: , Rfl:  .  Cyanocobalamin (VITAMIN B 12 PO), Take 1,000 mcg by mouth every morning. , Disp: , Rfl:  .  cyclophosphamide (CYTOXAN) 50 MG tablet, Take 5 capsules on an empty stomach once a week for 3 weeks, then off 1 week, Disp: 15 tablet, Rfl: 6 .  dexamethasone (DECADRON) 4 MG tablet, Take 5 pills, WITH FOOD, once a week with your chemotherapy pills., Disp: 60 tablet, Rfl: 3 .  HYDROcodone-acetaminophen (NORCO/VICODIN) 5-325 MG  tablet, Take 1 tablet by mouth every 6 (six) hours as needed for moderate pain., Disp: 60 tablet, Rfl: 0 .  ixazomib citrate (NINLARO) 4 MG capsule, Take 1 capsule (4 mg total) by mouth See admin instructions. Take on empty stomach 1hr before food. Day #1, # 8, #15, then day # 28 repeat., Disp: 3 capsule, Rfl: 2 .  Multiple Vitamins-Minerals (MULTIVITAL PO), Take 1 tablet by mouth every morning. , Disp: , Rfl:  .  polyethylene glycol (MIRALAX / GLYCOLAX) packet, Take 17 g by mouth daily as needed. Reported on 09/26/2015, Disp: , Rfl:  .  Pyridoxine HCl (VITAMIN B-6) 250 MG tablet, Take 500 mg by mouth daily., Disp: , Rfl:  .  Sennosides-Docusate Sodium (SENNA S PO), Reported on 10/17/2015, Disp: , Rfl: 12 .  Vitamin D, Ergocalciferol, (DRISDOL) 50000 UNITS CAPS capsule, Take 50,000 Units by mouth every 7 (seven) days. Sundays, Disp: , Rfl:   Allergies:  Allergies  Allergen Reactions  . Sulfa Antibiotics Itching    Past Medical History, Surgical history, Social history, and Family History were reviewed and updated.  Review of Systems: As above  Physical Exam:  height is 5\' 11"  (1.803 m) and weight is 138 lb (62.596 kg). His temperature is 97.4 F (36.3 C). His blood pressure is 143/72 and his pulse is 76. His respiration is 16.  Thin, elderly gentleman. He is no distress. Head and neck exam shows no ocular or oral lesions. He has no palpable cervical or supraclavicular lymph nodes. Lungs are clear bilaterally. Cardiac exam is regular rate and rhythm with no murmurs rubs or bruits. Abdomen is soft. He has good bowel sounds. There is no fluid wave. There is no palpable liver or spleen. Back exam no tenderness over the spine ribs or hips. He has some kyphosis. Extremities shows some age related changes. He has good range of motion and strength. Skin exam shows no rashes. Neurological exam is nonfocal.  Lab Results  Component Value Date   WBC 5.0 10/27/2015   HGB 8.7* 10/27/2015   HCT 25.5*  10/27/2015   MCV 95 10/27/2015   PLT 101* 10/27/2015     Chemistry      Component Value Date/Time   NA 139 10/27/2015 1035   NA 140 07/15/2014 1345   K 3.9 10/27/2015 1035   K 4.6 07/15/2014 1345   CL 105 10/27/2015 1035   CL 106 07/15/2014 1345   CO2 26 10/27/2015 1035   CO2 27 07/15/2014 1345   BUN 35* 10/27/2015 1035   BUN 18 07/15/2014 1345   CREATININE 1.8* 10/27/2015 1035   CREATININE 1.41* 07/15/2014 1345      Component Value Date/Time   CALCIUM 10.1 10/27/2015 1035   CALCIUM 8.7 07/15/2014 1345   ALKPHOS 85* 10/27/2015 1035   ALKPHOS 101 07/15/2014 1345   AST 29 10/27/2015 1035   AST 18 07/15/2014 1345   ALT 23 10/27/2015 1035   ALT 15 07/15/2014 1345   BILITOT 0.80 10/27/2015 1035   BILITOT 1.3* 07/15/2014 1345       Impression and Plan: Mr. Wyke is an 82 year old gentleman with IgG lambda myeloma.   I am somewhat encouraged that his total protein level is down. He is not hypercalcemic. Hopefully, this is a sign that things are starting to work a little bit better.  He will get some IV fluids today.  He is little bit more anemic. We will have to watch out with this closely.  I want him back in 3 weeks. By then, he'll be on his third cycle of treatment. Hopefully, we will see that he is still responding.  I spent about 30 minutes with he and his daughter-in-law. She is very nice. I think it is great that she came with him to try to help out.   [name], MD 3/17/20174:49 PM

## 2015-10-27 NOTE — Patient Instructions (Signed)

## 2015-10-28 LAB — IGG, IGA, IGM
IgA, Qn, Serum: 151 mg/dL (ref 61–437)
IgG, Qn, Serum: 3694 mg/dL — ABNORMAL HIGH (ref 700–1600)
IgM, Qn, Serum: 32 mg/dL (ref 15–143)

## 2015-10-30 LAB — KAPPA/LAMBDA LIGHT CHAINS
IG KAPPA FREE LIGHT CHAIN: 16.1 mg/L (ref 3.30–19.40)
IG LAMBDA FREE LIGHT CHAIN: 516.66 mg/L — AB (ref 5.71–26.30)
Kappa/Lambda FluidC Ratio: 0.03 — ABNORMAL LOW (ref 0.26–1.65)

## 2015-11-01 LAB — PROTEIN ELECTROPHORESIS, SERUM, WITH REFLEX
A/G RATIO SPE: 0.7 (ref 0.7–1.7)
ALPHA 1: 0.3 g/dL (ref 0.0–0.4)
Albumin: 3.3 g/dL (ref 2.9–4.4)
Alpha 2: 0.8 g/dL (ref 0.4–1.0)
Beta: 0.9 g/dL (ref 0.7–1.3)
GAMMA GLOBULIN: 2.9 g/dL — AB (ref 0.4–1.8)
Globulin, Total: 4.9 g/dL — ABNORMAL HIGH (ref 2.2–3.9)
INTERPRETATION(SEE BELOW): 0
M-Spike, %: 2.6 g/dL — ABNORMAL HIGH
Total Protein: 8.2 g/dL (ref 6.0–8.5)

## 2015-11-12 ENCOUNTER — Encounter: Payer: Self-pay | Admitting: Radiation Oncology

## 2015-11-12 NOTE — Progress Notes (Signed)
  Radiation Oncology         (336) [phone] ________________________________  Name: Christian Burns MRN: [id number]  Date: 11/12/2015  DOB: 15-Jan-1930  End of Treatment Note  Diagnosis:   Progressive multiple myeloma     Indication for treatment:  Lower thoracic upper lumbar spine involvement and pain       Radiation treatment dates:   February 14 through October 20, 2015  Site/dose:   Lower thoracic spine, upper lumbar spine, 30 gray in 15 fractions  Beams/energy:   Patient was initially treated with an AP PA beam arrangement. In light of the significant overlap with his previous radiation therapy the patient underwent re-simulation for assessment of tumor shrinkage and proceeded with IM RT planning for his last 10 treatments  Narrative: The patient tolerated radiation treatment relatively well.   He remained neurologically intact. His pain in the lower back and right pelvis area improved some during the course of his treatment.  Plan: The patient has completed radiation treatment. The patient will return to radiation oncology clinic for routine followup in one month. I advised them to call or return sooner if they have any questions or concerns related to their recovery or treatment.  -----------------------------------  Blair Promise, PhD, MD

## 2015-11-17 ENCOUNTER — Other Ambulatory Visit (HOSPITAL_BASED_OUTPATIENT_CLINIC_OR_DEPARTMENT_OTHER): Payer: Medicare HMO

## 2015-11-17 ENCOUNTER — Ambulatory Visit (HOSPITAL_BASED_OUTPATIENT_CLINIC_OR_DEPARTMENT_OTHER): Payer: Medicare HMO | Admitting: Hematology & Oncology

## 2015-11-17 ENCOUNTER — Encounter: Payer: Self-pay | Admitting: Hematology & Oncology

## 2015-11-17 VITALS — BP 126/59 | HR 76 | Temp 97.9°F | Resp 16 | Ht 71.0 in | Wt 135.0 lb

## 2015-11-17 DIAGNOSIS — C9002 Multiple myeloma in relapse: Secondary | ICD-10-CM | POA: Diagnosis not present

## 2015-11-17 DIAGNOSIS — R638 Other symptoms and signs concerning food and fluid intake: Secondary | ICD-10-CM

## 2015-11-17 LAB — CMP (CANCER CENTER ONLY)
ALK PHOS: 77 U/L (ref 26–84)
ALT: 26 U/L (ref 10–47)
AST: 39 U/L — ABNORMAL HIGH (ref 11–38)
Albumin: 3.1 g/dL — ABNORMAL LOW (ref 3.3–5.5)
BUN: 37 mg/dL — AB (ref 7–22)
CO2: 25 mEq/L (ref 18–33)
CREATININE: 1.8 mg/dL — AB (ref 0.6–1.2)
Calcium: 9.9 mg/dL (ref 8.0–10.3)
Chloride: 105 mEq/L (ref 98–108)
Glucose, Bld: 123 mg/dL — ABNORMAL HIGH (ref 73–118)
POTASSIUM: 4.2 meq/L (ref 3.3–4.7)
SODIUM: 139 meq/L (ref 128–145)
TOTAL PROTEIN: 8.9 g/dL — AB (ref 6.4–8.1)
Total Bilirubin: 0.9 mg/dl (ref 0.20–1.60)

## 2015-11-17 LAB — CBC WITH DIFFERENTIAL (CANCER CENTER ONLY)
BASO#: 0 10*3/uL (ref 0.0–0.2)
BASO%: 0.2 % (ref 0.0–2.0)
EOS%: 0.7 % (ref 0.0–7.0)
Eosinophils Absolute: 0 10*3/uL (ref 0.0–0.5)
HCT: 27 % — ABNORMAL LOW (ref 38.7–49.9)
HEMOGLOBIN: 9.2 g/dL — AB (ref 13.0–17.1)
LYMPH#: 0.2 10*3/uL — ABNORMAL LOW (ref 0.9–3.3)
LYMPH%: 5.2 % — AB (ref 14.0–48.0)
MCH: 33.8 pg — ABNORMAL HIGH (ref 28.0–33.4)
MCHC: 34.1 g/dL (ref 32.0–35.9)
MCV: 99 fL — AB (ref 82–98)
MONO#: 0.9 10*3/uL (ref 0.1–0.9)
MONO%: 20.7 % — ABNORMAL HIGH (ref 0.0–13.0)
NEUT%: 73.2 % (ref 40.0–80.0)
NEUTROS ABS: 3.1 10*3/uL (ref 1.5–6.5)
PLATELETS: 122 10*3/uL — AB (ref 145–400)
RBC: 2.72 10*6/uL — AB (ref 4.20–5.70)
RDW: 17.1 % — ABNORMAL HIGH (ref 11.1–15.7)
WBC: 4.2 10*3/uL (ref 4.0–10.0)

## 2015-11-17 NOTE — Progress Notes (Signed)
Hematology and Oncology Follow Up Visit  Christian Burns [id number] June 14, 1930 82 y.o. 11/17/2015   Principle Diagnosis:   Recurrent IgG lambda myeloma - progressive  Hypercalcemia of malignancy.  Current Therapy:    Cytoxan 250mg  po q wk (3/1)/Ixazomib 4mg  po q week (3/1) - s/p cycle #2  Zometa 3.3 mg IV q. 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 He is wife moved into assisted living recently. His been a transition for them. He is trying to adjust.  He is not hurting on his right side or leg. Had radiation therapy to a T12 last cytoma. This helped quite a bit.  His last myeloma studies show that his M spike was holding steady at 2.6 g/dL. His IgG level was 3700 mg/dL. His lambda light chain was 52 mg/dL.  His appetite has been doing okay. He's had no nausea or vomiting.  He is tolerating the Cytoxan and Ninlaro pretty well. He is having that he only has to take these once a week.  He seemed to be doing well with the Decadron. I think this is helping his appetite.  He has not had any problems with fever. He's had no rashes. He's had no bleeding. He's had no mouth sores.  Overall, his performance status is ECOG 2.  Medications:  Current outpatient prescriptions:  .  aspirin EC 81 MG tablet, Take 81 mg by mouth daily. , Disp: , Rfl:  .  Calcium Carb-Cholecalciferol (CALCIUM 600 + D PO), Take by mouth every morning., Disp: , Rfl:  .  Cyanocobalamin (VITAMIN B 12 PO), Take 1,000 mcg by mouth every morning. , Disp: , Rfl:  .  cyclophosphamide (CYTOXAN) 50 MG tablet, Take 5 capsules on an empty stomach once a week for 3 weeks, then off 1 week, Disp: 15 tablet, Rfl: 6 .  dexamethasone (DECADRON) 4 MG tablet, Take 5 pills, WITH FOOD, once a week with your chemotherapy pills., Disp: 60 tablet, Rfl: 3 .  HYDROcodone-acetaminophen (NORCO/VICODIN) 5-325 MG tablet, Take 1 tablet by mouth every 6 (six) hours as needed  for moderate pain., Disp: 60 tablet, Rfl: 0 .  ixazomib citrate (NINLARO) 4 MG capsule, Take 1 capsule (4 mg total) by mouth See admin instructions. Take on empty stomach 1hr before food. Day #1, # 8, #15, then day # 28 repeat., Disp: 3 capsule, Rfl: 2 .  Multiple Vitamins-Minerals (MULTIVITAL PO), Take 1 tablet by mouth every morning. , Disp: , Rfl:  .  polyethylene glycol (MIRALAX / GLYCOLAX) packet, Take 17 g by mouth daily as needed. Reported on 09/26/2015, Disp: , Rfl:  .  Pyridoxine HCl (VITAMIN B-6) 250 MG tablet, Take 500 mg by mouth daily., Disp: , Rfl:  .  Sennosides-Docusate Sodium (SENNA S PO), Reported on 10/17/2015, Disp: , Rfl: 12 .  Vitamin D, Ergocalciferol, (DRISDOL) 50000 UNITS CAPS capsule, Take 50,000 Units by mouth every 7 (seven) days. Sundays, Disp: , Rfl:   Allergies:  Allergies  Allergen Reactions  . Sulfa Antibiotics Itching    Past Medical History, Surgical history, Social history, and Family History were reviewed and updated.  Review of Systems: As above  Physical Exam:  height is 5\' 11"  (1.803 m) and weight is 135 lb (61.236 kg). His oral temperature is 97.9 F (36.6 C). His blood pressure is 126/59 and his pulse is 76. His respiration is 16.   Thin, elderly gentleman. He is no  distress. Head and neck exam shows no ocular or oral lesions. He has no palpable cervical or supraclavicular lymph nodes. Lungs are clear bilaterally. Cardiac exam is regular rate and rhythm with no murmurs rubs or bruits. Abdomen is soft. He has good bowel sounds. There is no fluid wave. There is no palpable liver or spleen. Back exam no tenderness over the spine ribs or hips. He has some kyphosis. Extremities shows some age related changes. He has good range of motion and strength. Skin exam shows no rashes. Neurological exam is nonfocal.  Lab Results  Component Value Date   WBC 4.2 11/17/2015   HGB 9.2* 11/17/2015   HCT 27.0* 11/17/2015   MCV 99* 11/17/2015   PLT 122* 11/17/2015      Chemistry      Component Value Date/Time   NA 139 11/17/2015 1351   NA 140 07/15/2014 1345   K 4.2 11/17/2015 1351   K 4.6 07/15/2014 1345   CL 105 11/17/2015 1351   CL 106 07/15/2014 1345   CO2 25 11/17/2015 1351   CO2 27 07/15/2014 1345   BUN 37* 11/17/2015 1351   BUN 18 07/15/2014 1345   CREATININE 1.8* 11/17/2015 1351   CREATININE 1.41* 07/15/2014 1345      Component Value Date/Time   CALCIUM 9.9 11/17/2015 1351   CALCIUM 8.7 07/15/2014 1345   ALKPHOS 77 11/17/2015 1351   ALKPHOS 101 07/15/2014 1345   AST 39* 11/17/2015 1351   AST 18 07/15/2014 1345   ALT 26 11/17/2015 1351   ALT 15 07/15/2014 1345   BILITOT 0.90 11/17/2015 1351   BILITOT 1.3* 07/15/2014 1345       Impression and Plan: Christian Burns is an 82 year old gentleman with IgG lambda myeloma.   I Am happy that he really is not hurting much. This is certainly a good sign for him. In addition, his calcium has stayed down.  We will see what his myeloma studies show.  I know it has been difficult for him recently as he and his wife moved into assisted living.  I will hold on his Zometa today.  I will like to see him back in one month.  I spent about 25 minutes with he and his son. It is always nice talk with him.   Christian Napoleon, MD 4/7/20174:02 PM

## 2015-11-18 LAB — IGG, IGA, IGM
IGA/IMMUNOGLOBULIN A, SERUM: 139 mg/dL (ref 61–437)
IgM, Qn, Serum: 29 mg/dL (ref 15–143)

## 2015-11-20 LAB — KAPPA/LAMBDA LIGHT CHAINS
IG KAPPA FREE LIGHT CHAIN: 14.45 mg/L (ref 3.30–19.40)
Ig Lambda Free Light Chain: 612.71 mg/L — ABNORMAL HIGH (ref 5.71–26.30)
Kappa/Lambda FluidC Ratio: 0.02 — ABNORMAL LOW (ref 0.26–1.65)

## 2015-11-21 LAB — PROTEIN ELECTROPHORESIS, SERUM, WITH REFLEX
A/G RATIO SPE: 0.8 (ref 0.7–1.7)
ALBUMIN: 3.6 g/dL (ref 2.9–4.4)
Alpha 1: 0.3 g/dL (ref 0.0–0.4)
Alpha 2: 0.7 g/dL (ref 0.4–1.0)
BETA: 0.9 g/dL (ref 0.7–1.3)
GAMMA GLOBULIN: 2.9 g/dL — AB (ref 0.4–1.8)
GLOBULIN, TOTAL: 4.8 g/dL — AB (ref 2.2–3.9)
Interpretation(See Below): 0
M-SPIKE, %: 2.7 g/dL — AB
TOTAL PROTEIN: 8.4 g/dL (ref 6.0–8.5)

## 2015-11-23 MED FILL — *CYCLOPHOSPHAMIDE 50 MG CAP: 50 | 21 days supply | Qty: 15 | Fill #3

## 2015-12-04 ENCOUNTER — Encounter: Payer: Self-pay | Admitting: Oncology

## 2015-12-07 ENCOUNTER — Ambulatory Visit
Admission: RE | Admit: 2015-12-07 | Discharge: 2015-12-07 | Disposition: A | Discharge status: Home / Self Care | Payer: Medicare HMO | Source: Ambulatory Visit | Attending: Radiation Oncology | Admitting: Radiation Oncology

## 2015-12-07 ENCOUNTER — Encounter: Payer: Self-pay | Admitting: Radiation Oncology

## 2015-12-07 VITALS — BP 150/66 | HR 83 | Temp 97.9°F | Resp 18 | Ht 71.0 in | Wt 140.1 lb

## 2015-12-07 DIAGNOSIS — C9002 Multiple myeloma in relapse: Secondary | ICD-10-CM

## 2015-12-07 NOTE — Progress Notes (Signed)
Radiation Oncology         (336) [phone] ________________________________  Name: Christian Burns MRN: [id number]  Date: 12/07/2015  DOB: [birth year]  Follow-Up Visit Note  CC: Donnajean Lopes, MD  [name], MD    ICD-9-CM ICD-10-CM   1. Multiple myeloma in relapse [facility]) 203.02 C90.02     Diagnosis: Progressive multiple myeloma     Indication for treatment:  Lower thoracic upper lumbar spine involvement and pain       Interval Since Last Radiation: 6 1/2 weeks   09/26/15 - 10/20/15: Lower thoracic spine, upper lumbar spine, 30 gray in 15 fractions  06/17/13 - 07/06/13: Upper lumbar spine, 35 gray in 14 fractions  Narrative:  The patient returns today for routine follow-up. He denies having pain. He does report having a feeling of weakness in his right hip when rising from a sitting position from a long period of time. He reports having a good energy level and appetite. He reports that he is taking Ninlaro along with decadron. He reports some difficulty sleeping at night and nausea.  ALLERGIES:  is allergic to sulfa antibiotics.  Meds: Current Outpatient Prescriptions  Medication Sig Dispense Refill  . aspirin EC 81 MG tablet Take 81 mg by mouth daily.     . Calcium Carb-Cholecalciferol (CALCIUM 600 + D PO) Take by mouth every morning.    . Cyanocobalamin (VITAMIN B 12 PO) Take 1,000 mcg by mouth every morning.     . cyclophosphamide (CYTOXAN) 50 MG tablet Take 5 capsules on an empty stomach once a week for 3 weeks, then off 1 week 15 tablet 6  . dexamethasone (DECADRON) 4 MG tablet Take 5 pills, WITH FOOD, once a week with your chemotherapy pills. 60 tablet 3  . ixazomib citrate (NINLARO) 4 MG capsule Take 1 capsule (4 mg total) by mouth See admin instructions. Take on empty stomach 1hr before food. Day #1, # 8, #15, then day # 28 repeat. 3 capsule 2  . Multiple Vitamins-Minerals (MULTIVITAL PO) Take 1 tablet by mouth every morning.     . Pyridoxine HCl (VITAMIN B-6) 250 MG  tablet Take 500 mg by mouth daily.    . Vitamin D, Ergocalciferol, (DRISDOL) 50000 UNITS CAPS capsule Take 50,000 Units by mouth every 7 (seven) days. Sundays    . HYDROcodone-acetaminophen (NORCO/VICODIN) 5-325 MG tablet Take 1 tablet by mouth every 6 (six) hours as needed for moderate pain. (Patient not taking: Reported on 12/07/2015) 60 tablet 0  . polyethylene glycol (MIRALAX / GLYCOLAX) packet Take 17 g by mouth daily as needed. Reported on 12/07/2015    . Sennosides-Docusate Sodium (SENNA S PO) Reported on 12/07/2015  12   No current facility-administered medications for this encounter.    Physical Findings: The patient is in no acute distress. Patient is alert and oriented.  height is _0  (1.803 m) and weight is 140 lb 1.6 oz (63.549 kg). His oral temperature is 97.9 F (36.6 C). His blood pressure is 150/66 and his pulse is 83. His respiration is 18 and oxygen saturation is 100%.   Lungs are clear to auscultation bilaterally. Heart has regular rate and rhythm. No palpable cervical, supraclavicular, or axillary adenopathy. Abdomen soft, non-tender, normal bowel sounds. Muscle strength and symmetry is 5/5.  Lab Findings: Lab Results  Component Value Date   WBC 4.2 11/17/2015   HGB 9.2* 11/17/2015   HCT 27.0* 11/17/2015   MCV 99* 11/17/2015   PLT 122* 11/17/2015    Radiographic  Findings: No results found.  Impression:  The patient is recovering from the effects of radiation. I believe that the patient's difficulty sleeping at night could be a side effect of the steroids given with chemotherapy.  Plan: The patient is scheduled to follow up with Dr. Marin Olp every month. The patient will see me on a PRN basis. He could call or visit if he has any questions or concerns.  ____________________________________ -----------------------------------  Blair Promise, PhD, MD  This document serves as a record of services personally performed by Gery Pray, MD. It was created on his  behalf by Darcus Austin, a trained medical scribe. The creation of this record is based on the scribe's personal observations and the provider's statements to them. This document has been checked and approved by the attending provider.

## 2015-12-07 NOTE — Progress Notes (Signed)
Christian Burns here for follow up.  He denies having pain.  He does report having a feeling of weakness in his right hip when rising from a sitting position.  He reports having a good energy level and appetite.  He reports that he is taking a chemotherapy pill along with decadron.  BP 150/66 mmHg  Pulse 83  Temp(Src) 97.9 F (36.6 C) (Oral)  Resp 18  Ht 5\' 11"  (1.803 m)  Wt 140 lb 1.6 oz (63.549 kg)  BMI 19.55 kg/m2  SpO2 100%   Wt Readings from Last 3 Encounters:  12/07/15 140 lb 1.6 oz (63.549 kg)  11/17/15 135 lb (61.236 kg)  10/27/15 138 lb (62.596 kg)

## 2015-12-19 DIAGNOSIS — C44319 Basal cell carcinoma of skin of other parts of face: Secondary | ICD-10-CM | POA: Diagnosis not present

## 2015-12-19 DIAGNOSIS — D485 Neoplasm of uncertain behavior of skin: Secondary | ICD-10-CM | POA: Diagnosis not present

## 2015-12-19 DIAGNOSIS — L821 Other seborrheic keratosis: Secondary | ICD-10-CM | POA: Diagnosis not present

## 2015-12-19 DIAGNOSIS — L570 Actinic keratosis: Secondary | ICD-10-CM | POA: Diagnosis not present

## 2015-12-19 DIAGNOSIS — L905 Scar conditions and fibrosis of skin: Secondary | ICD-10-CM | POA: Diagnosis not present

## 2015-12-21 MED FILL — *CYCLOPHOSPHAMIDE 50 MG CAP: 50 | 21 days supply | Qty: 15 | Fill #4

## 2015-12-22 ENCOUNTER — Ambulatory Visit (HOSPITAL_BASED_OUTPATIENT_CLINIC_OR_DEPARTMENT_OTHER): Payer: Medicare HMO

## 2015-12-22 ENCOUNTER — Ambulatory Visit (HOSPITAL_BASED_OUTPATIENT_CLINIC_OR_DEPARTMENT_OTHER): Payer: Medicare HMO | Admitting: Hematology & Oncology

## 2015-12-22 ENCOUNTER — Encounter: Payer: Self-pay | Admitting: Hematology & Oncology

## 2015-12-22 ENCOUNTER — Other Ambulatory Visit: Payer: Self-pay | Admitting: Family

## 2015-12-22 ENCOUNTER — Other Ambulatory Visit (HOSPITAL_BASED_OUTPATIENT_CLINIC_OR_DEPARTMENT_OTHER): Payer: Medicare HMO

## 2015-12-22 VITALS — BP 130/61 | HR 78 | Temp 97.8°F | Resp 16 | Ht 71.0 in | Wt 139.0 lb

## 2015-12-22 DIAGNOSIS — C9002 Multiple myeloma in relapse: Secondary | ICD-10-CM

## 2015-12-22 LAB — CBC WITH DIFFERENTIAL (CANCER CENTER ONLY)
BASO#: 0 10*3/uL (ref 0.0–0.2)
BASO%: 0.4 % (ref 0.0–2.0)
EOS ABS: 0.1 10*3/uL (ref 0.0–0.5)
EOS%: 1.7 % (ref 0.0–7.0)
HCT: 27.4 % — ABNORMAL LOW (ref 38.7–49.9)
HEMOGLOBIN: 9.4 g/dL — AB (ref 13.0–17.1)
LYMPH#: 0.2 10*3/uL — ABNORMAL LOW (ref 0.9–3.3)
LYMPH%: 4 % — ABNORMAL LOW (ref 14.0–48.0)
MCH: 34.8 pg — AB (ref 28.0–33.4)
MCHC: 34.3 g/dL (ref 32.0–35.9)
MCV: 102 fL — AB (ref 82–98)
MONO#: 0.8 10*3/uL (ref 0.1–0.9)
MONO%: 16.3 % — ABNORMAL HIGH (ref 0.0–13.0)
NEUT#: 3.7 10*3/uL (ref 1.5–6.5)
NEUT%: 77.6 % (ref 40.0–80.0)
Platelets: 105 10*3/uL — ABNORMAL LOW (ref 145–400)
RBC: 2.7 10*6/uL — AB (ref 4.20–5.70)
RDW: 15.6 % (ref 11.1–15.7)
WBC: 4.7 10*3/uL (ref 4.0–10.0)

## 2015-12-22 LAB — CMP (CANCER CENTER ONLY)
ALBUMIN: 3 g/dL — AB (ref 3.3–5.5)
ALT(SGPT): 25 U/L (ref 10–47)
AST: 32 U/L (ref 11–38)
Alkaline Phosphatase: 86 U/L — ABNORMAL HIGH (ref 26–84)
BUN, Bld: 31 mg/dL — ABNORMAL HIGH (ref 7–22)
CALCIUM: 9.4 mg/dL (ref 8.0–10.3)
CHLORIDE: 106 meq/L (ref 98–108)
CO2: 27 meq/L (ref 18–33)
Creat: 1.7 mg/dl — ABNORMAL HIGH (ref 0.6–1.2)
Glucose, Bld: 105 mg/dL (ref 73–118)
POTASSIUM: 4.1 meq/L (ref 3.3–4.7)
Sodium: 140 mEq/L (ref 128–145)
TOTAL PROTEIN: 8.8 g/dL — AB (ref 6.4–8.1)
Total Bilirubin: 0.9 mg/dl (ref 0.20–1.60)

## 2015-12-22 MED ORDER — ZOLEDRONIC ACID 4 MG/100ML IV SOLN: 4.0000 mg | Freq: Once | INTRAVENOUS | Status: DC

## 2015-12-22 MED ORDER — SODIUM CHLORIDE 0.9 % IV SOLN
3.3000 mg | Freq: Once | INTRAVENOUS | Status: AC
  Administered 2015-12-22: 3.3 mg via INTRAVENOUS
  Filled 2015-12-22: qty 4.13

## 2015-12-22 NOTE — Patient Instructions (Signed)

## 2015-12-22 NOTE — Progress Notes (Signed)
Hematology and Oncology Follow Up Visit  Christian Burns:[id number] June 29, 1930 82 y.o. 12/22/2015   Principle Diagnosis:   Recurrent IgG lambda myeloma - progressive  Hypercalcemia of malignancy.  Current Therapy:    Cytoxan 250mg  po q wk (3/1)/Ixazomib 4mg  po q week (3/1) - s/p cycle #2  Zometa 3.3 mg IV q. 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 He is wife moved into assisted living recently. He is definitely getting used to this. He is asked to going to exercise class daily.   He is not hurting on his right side of his leg. 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 This helped quite a bit.  His last myeloma studies show that his M spike was holding steady at 2.7 g/dL. His IgG level was 3681 mg/dL. His lambda light chain was 61 mg/dL.  His appetite has been doing okay. He's had no nausea or vomiting.  He is tolerating the Cytoxan and Ninlaro pretty well. He is having that he only has to take these once a week.  He seemed to be doing well with the Decadron. I think this is helping his appetite.  He has not had any problems with fever. He's had no rashes. He's had no bleeding. He's had no mouth sores.  He recently had a basal cell cancer removed from left side of his face. It sounds like needs to go to surgery to have a more extensive surgical resection. I told him to let me know when this will happen so that we can make sure that he is off his treatments so that bleeding will be minimal.  Overall, his performance status is ECOG 2.  Medications:  Current outpatient prescriptions:  .  aspirin EC 81 MG tablet, Take 81 mg by mouth daily. , Disp: , Rfl:  .  Calcium Carb-Cholecalciferol (CALCIUM 600 + D PO), Take by mouth every morning., Disp: , Rfl:  .  Cyanocobalamin (VITAMIN B 12 PO), Take 1,000 mcg by mouth every morning. , Disp: , Rfl:  .  cyclophosphamide (CYTOXAN) 50 MG tablet, Take 5 capsules on an  empty stomach once a week for 3 weeks, then off 1 week, Disp: 15 tablet, Rfl: 6 .  dexamethasone (DECADRON) 4 MG tablet, Take 5 pills, WITH FOOD, once a week with your chemotherapy pills., Disp: 60 tablet, Rfl: 3 .  HYDROcodone-acetaminophen (NORCO/VICODIN) 5-325 MG tablet, Take 1 tablet by mouth every 6 (six) hours as needed for moderate pain., Disp: 60 tablet, Rfl: 0 .  ixazomib citrate (NINLARO) 4 MG capsule, Take 1 capsule (4 mg total) by mouth See admin instructions. Take on empty stomach 1hr before food. Day #1, # 8, #15, then day # 28 repeat., Disp: 3 capsule, Rfl: 2 .  Multiple Vitamins-Minerals (MULTIVITAL PO), Take 1 tablet by mouth every morning. , Disp: , Rfl:  .  polyethylene glycol (MIRALAX / GLYCOLAX) packet, Take 17 g by mouth daily as needed. Reported on 12/07/2015, Disp: , Rfl:  .  Pyridoxine HCl (VITAMIN B-6) 250 MG tablet, Take 500 mg by mouth daily., Disp: , Rfl:  .  Sennosides-Docusate Sodium (SENNA S PO), Reported on 12/07/2015, Disp: , Rfl: 12 .  Vitamin D, Ergocalciferol, (DRISDOL) 50000 UNITS CAPS capsule, Take 50,000 Units by mouth every 7 (seven) days. Sundays, Disp: , Rfl:  No current facility-administered medications for this visit.  Facility-Administered Medications Ordered in Other Visits:  .  Zoledronic Acid (ZOMETA) IVPB 4 mg, 4 mg, Intravenous, Once, Eliezer Bottom, NP  Allergies:  Allergies  Allergen Reactions  . Sulfa Antibiotics Itching    Past Medical History, Surgical history, Social history, and Family History were reviewed and updated.  Review of Systems: As above  Physical Exam:  height is 5\' 11"  (1.803 m) and weight is 139 lb (63.05 kg). His oral temperature is 97.8 F (36.6 C). His blood pressure is 130/61 and his pulse is 78. His respiration is 16.   Thin, elderly gentleman. He is no distress. Head and neck exam shows no ocular or oral lesions. He has no palpable cervical or supraclavicular lymph nodes. Lungs are clear bilaterally. Cardiac  exam is regular rate and rhythm with no murmurs rubs or bruits. Abdomen is soft. He has good bowel sounds. There is no fluid wave. There is no palpable liver or spleen. Back exam no tenderness over the spine ribs or hips. He has some kyphosis. Extremities shows some age related changes. He has good range of motion and strength. Skin exam shows no rashes. Neurological exam is nonfocal.  Lab Results  Component Value Date   WBC 4.7 12/22/2015   HGB 9.4* 12/22/2015   HCT 27.4* 12/22/2015   MCV 102* 12/22/2015   PLT 105* 12/22/2015     Chemistry      Component Value Date/Time   NA 140 12/22/2015 1329   NA 140 07/15/2014 1345   K 4.1 12/22/2015 1329   K 4.6 07/15/2014 1345   CL 106 12/22/2015 1329   CL 106 07/15/2014 1345   CO2 27 12/22/2015 1329   CO2 27 07/15/2014 1345   BUN 31* 12/22/2015 1329   BUN 18 07/15/2014 1345   CREATININE 1.7* 12/22/2015 1329   CREATININE 1.41* 07/15/2014 1345      Component Value Date/Time   CALCIUM 9.4 12/22/2015 1329   CALCIUM 8.7 07/15/2014 1345   ALKPHOS 86* 12/22/2015 1329   ALKPHOS 101 07/15/2014 1345   AST 32 12/22/2015 1329   AST 18 07/15/2014 1345   ALT 25 12/22/2015 1329   ALT 15 07/15/2014 1345   BILITOT 0.90 12/22/2015 1329   BILITOT 1.3* 07/15/2014 1345       Impression and Plan: Christian Burns is an 82 year old gentleman with IgG lambda myeloma.   I am happy that he really is not hurting much. This is certainly a good sign for him. In addition, his calcium has stayed down.  We will go ahead and give him Zometa today. I think this is very helpful.  I think that it is encouraging that his calcium is not trended back upward.  We will see what his myeloma studies show. Last ones were fairly stable.  I told him to let me know when he has the surgery for the left side of his face .  We can was adjust his Ninlaro/Cytoxan protocol.   I want to make sure that his quality of life is good.  I will plan to see him back in another 6  weeks.  I spent about 25-30 minutes with he and his son and great-grandson.   [name], MD 5/12/20173:20 PM

## 2015-12-23 LAB — IGG, IGA, IGM
IgA, Qn, Serum: 120 mg/dL (ref 61–437)
IgM, Qn, Serum: 26 mg/dL (ref 15–143)

## 2015-12-25 LAB — KAPPA/LAMBDA LIGHT CHAINS
Ig Kappa Free Light Chain: 14.9 mg/L (ref 3.30–19.40)
Ig Lambda Free Light Chain: 597.56 mg/L — ABNORMAL HIGH (ref 5.71–26.30)
KAPPA/LAMBDA FLC RATIO: 0.02 — AB (ref 0.26–1.65)

## 2015-12-26 ENCOUNTER — Other Ambulatory Visit: Payer: Self-pay | Admitting: *Deleted

## 2015-12-26 DIAGNOSIS — M25551 Pain in right hip: Secondary | ICD-10-CM

## 2015-12-26 DIAGNOSIS — C9000 Multiple myeloma not having achieved remission: Secondary | ICD-10-CM

## 2015-12-26 MED ORDER — IXAZOMIB CITRATE 4 MG PO CAPS: 4.0000 mg | ORAL_CAPSULE | ORAL | Status: DC

## 2015-12-27 ENCOUNTER — Telehealth: Payer: Self-pay | Admitting: Nurse Practitioner

## 2015-12-27 LAB — PROTEIN ELECTROPHORESIS, SERUM, WITH REFLEX
A/G RATIO SPE: 0.8 (ref 0.7–1.7)
ALBUMIN: 3.4 g/dL (ref 2.9–4.4)
Alpha 1: 0.3 g/dL (ref 0.0–0.4)
Alpha 2: 0.6 g/dL (ref 0.4–1.0)
BETA: 0.8 g/dL (ref 0.7–1.3)
GAMMA GLOBULIN: 2.6 g/dL — AB (ref 0.4–1.8)
GLOBULIN, TOTAL: 4.4 g/dL — AB (ref 2.2–3.9)
Interpretation(See Below): 0
M-SPIKE, %: 2.5 g/dL — AB
TOTAL PROTEIN: 7.8 g/dL (ref 6.0–8.5)

## 2015-12-27 NOTE — Telephone Encounter (Addendum)
Pt verbalized understanding and appreciation.----- Message from [name], MD sent at 12/27/2015  2:52 PM EDT ----- Call - myeloma is better!!  Great job!!  Christian Burns

## 2016-01-15 ENCOUNTER — Other Ambulatory Visit: Payer: Self-pay | Admitting: *Deleted

## 2016-01-15 ENCOUNTER — Telehealth: Payer: Self-pay | Admitting: *Deleted

## 2016-01-15 DIAGNOSIS — C9001 Multiple myeloma in remission: Secondary | ICD-10-CM

## 2016-01-15 DIAGNOSIS — C9000 Multiple myeloma not having achieved remission: Secondary | ICD-10-CM

## 2016-01-15 MED ORDER — CYCLOPHOSPHAMIDE 50 MG PO TABS: ORAL_TABLET | ORAL | Status: DC

## 2016-01-15 MED ORDER — DEXAMETHASONE 4 MG PO TABS: ORAL_TABLET | ORAL | Status: DC

## 2016-01-15 NOTE — Telephone Encounter (Signed)
Patient requesting that decadron and [facility] can no longer easily come to [facility] for prescription pick up. Prescriptions sent.

## 2016-01-25 DIAGNOSIS — H01005 Unspecified blepharitis left lower eyelid: Secondary | ICD-10-CM | POA: Diagnosis not present

## 2016-01-25 DIAGNOSIS — H04123 Dry eye syndrome of bilateral lacrimal glands: Secondary | ICD-10-CM | POA: Diagnosis not present

## 2016-01-25 DIAGNOSIS — H524 Presbyopia: Secondary | ICD-10-CM | POA: Diagnosis not present

## 2016-01-25 DIAGNOSIS — H01002 Unspecified blepharitis right lower eyelid: Secondary | ICD-10-CM | POA: Diagnosis not present

## 2016-01-25 DIAGNOSIS — H01004 Unspecified blepharitis left upper eyelid: Secondary | ICD-10-CM | POA: Diagnosis not present

## 2016-01-25 DIAGNOSIS — H01001 Unspecified blepharitis right upper eyelid: Secondary | ICD-10-CM | POA: Diagnosis not present

## 2016-01-25 DIAGNOSIS — H26493 Other secondary cataract, bilateral: Secondary | ICD-10-CM | POA: Diagnosis not present

## 2016-02-02 ENCOUNTER — Other Ambulatory Visit (HOSPITAL_BASED_OUTPATIENT_CLINIC_OR_DEPARTMENT_OTHER): Payer: Medicare HMO

## 2016-02-02 ENCOUNTER — Ambulatory Visit (HOSPITAL_BASED_OUTPATIENT_CLINIC_OR_DEPARTMENT_OTHER): Payer: Medicare HMO

## 2016-02-02 ENCOUNTER — Encounter: Payer: Self-pay | Admitting: Hematology & Oncology

## 2016-02-02 ENCOUNTER — Ambulatory Visit (HOSPITAL_BASED_OUTPATIENT_CLINIC_OR_DEPARTMENT_OTHER): Payer: Medicare HMO | Admitting: Hematology & Oncology

## 2016-02-02 VITALS — BP 153/72 | HR 75 | Temp 97.9°F | Resp 16 | Ht 71.0 in | Wt 143.0 lb

## 2016-02-02 DIAGNOSIS — C9002 Multiple myeloma in relapse: Secondary | ICD-10-CM | POA: Diagnosis not present

## 2016-02-02 DIAGNOSIS — C9000 Multiple myeloma not having achieved remission: Secondary | ICD-10-CM

## 2016-02-02 LAB — CBC WITH DIFFERENTIAL (CANCER CENTER ONLY)
BASO#: 0 10*3/uL (ref 0.0–0.2)
BASO%: 0.4 % (ref 0.0–2.0)
EOS%: 2.4 % (ref 0.0–7.0)
Eosinophils Absolute: 0.1 10*3/uL (ref 0.0–0.5)
HEMATOCRIT: 28.9 % — AB (ref 38.7–49.9)
HEMOGLOBIN: 9.8 g/dL — AB (ref 13.0–17.1)
LYMPH#: 0.3 10*3/uL — AB (ref 0.9–3.3)
LYMPH%: 5.7 % — ABNORMAL LOW (ref 14.0–48.0)
MCH: 34.3 pg — ABNORMAL HIGH (ref 28.0–33.4)
MCHC: 33.9 g/dL (ref 32.0–35.9)
MCV: 101 fL — AB (ref 82–98)
MONO#: 0.7 10*3/uL (ref 0.1–0.9)
MONO%: 14.9 % — ABNORMAL HIGH (ref 0.0–13.0)
NEUT%: 76.6 % (ref 40.0–80.0)
NEUTROS ABS: 3.5 10*3/uL (ref 1.5–6.5)
PLATELETS: 136 10*3/uL — AB (ref 145–400)
RBC: 2.86 10*6/uL — AB (ref 4.20–5.70)
RDW: 14 % (ref 11.1–15.7)
WBC: 4.6 10*3/uL (ref 4.0–10.0)

## 2016-02-02 LAB — CMP (CANCER CENTER ONLY)
ALK PHOS: 82 U/L (ref 26–84)
ALT: 22 U/L (ref 10–47)
AST: 28 U/L (ref 11–38)
Albumin: 2.9 g/dL — ABNORMAL LOW (ref 3.3–5.5)
BUN: 31 mg/dL — AB (ref 7–22)
CHLORIDE: 107 meq/L (ref 98–108)
CO2: 29 mEq/L (ref 18–33)
CREATININE: 1.4 mg/dL — AB (ref 0.6–1.2)
Calcium: 10.1 mg/dL (ref 8.0–10.3)
GLUCOSE: 111 mg/dL (ref 73–118)
POTASSIUM: 4.2 meq/L (ref 3.3–4.7)
SODIUM: 135 meq/L (ref 128–145)
TOTAL PROTEIN: 8.9 g/dL — AB (ref 6.4–8.1)
Total Bilirubin: 0.8 mg/dl (ref 0.20–1.60)

## 2016-02-02 MED ORDER — SODIUM CHLORIDE 0.9 % IV SOLN
Freq: Once | INTRAVENOUS | Status: AC
  Administered 2016-02-02: 14:00:00 via INTRAVENOUS

## 2016-02-02 MED ORDER — ZOLEDRONIC ACID 4 MG/5ML IV CONC
3.3000 mg | Freq: Once | INTRAVENOUS | Status: AC
  Administered 2016-02-02: 3.3 mg via INTRAVENOUS
  Filled 2016-02-02: qty 4.13

## 2016-02-02 MED ORDER — ZOLEDRONIC ACID 4 MG/100ML IV SOLN: 4.0000 mg | Freq: Once | INTRAVENOUS | Status: DC

## 2016-02-02 NOTE — Patient Instructions (Signed)

## 2016-02-02 NOTE — Progress Notes (Signed)
Hematology and Oncology Follow Up Visit  Christian Burns [id number] 1930-03-25 82 y.o. 02/02/2016   Principle Diagnosis:   Recurrent IgG lambda myeloma - progressive  Hypercalcemia of malignancy.  Current Therapy:    Cytoxan 250mg  po q wk (3/1)/Ixazomib 4mg  po q week (3/1) - s/p cycle #2  Zometa 3.3 mg IV q. 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  Burns is back for followup. He looks quite good. He feels good. He has wife have an apartment in [place]. He is very active. His wife, unfortunately, is debilitated by arthritis and is in a wheelchair.   His myeloma studies are little bit better. He is tolerating his treatments quite nicely.  We last checked his studies, his M spike was 2.5 g/dL. His IgG level was 3592 mg/dL. His Kappa Lightchain was 60 mg/dL.   He has had no fever. He has had no bleeding. There has been no change in bowel or bladder habits. He's had no leg swelling. He's had no rashes.  He might have some surgery on his face for some basal cell carcinomas.   Thankfully, hypercalcemia has not been a problem for him.  Overall, his performance status is ECOG 2.  Medications:  Current outpatient prescriptions:  .  aspirin EC 81 MG tablet, Take 81 mg by mouth daily. , Disp: , Rfl:  .  Calcium Carb-Cholecalciferol (CALCIUM 600 + D PO), Take by mouth every morning., Disp: , Rfl:  .  Cyanocobalamin (VITAMIN B 12 PO), Take 1,000 mcg by mouth every morning. , Disp: , Rfl:  .  cyclophosphamide (CYTOXAN) 50 MG tablet, Take 5 capsules on an empty stomach once a week for 3 weeks, then off 1 week, Disp: 15 tablet, Rfl: 6 .  dexamethasone (DECADRON) 4 MG tablet, Take 5 pills, WITH FOOD, once a week with your chemotherapy pills., Disp: 60 tablet, Rfl: 3 .  HYDROcodone-acetaminophen (NORCO/VICODIN) 5-325 MG tablet, Take 1 tablet by mouth every 6 (six) hours as needed for moderate pain., Disp: 60 tablet, Rfl: 0 .  ixazomib citrate  (NINLARO) 4 MG capsule, Take 1 capsule (4 mg total) by mouth See admin instructions. Take on empty stomach 1hr before food. Day #1, # 8, #15, then day # 28 repeat., Disp: 3 capsule, Rfl: 2 .  Multiple Vitamins-Minerals (MULTIVITAL PO), Take 1 tablet by mouth every morning. , Disp: , Rfl:  .  Pyridoxine HCl (VITAMIN B-6) 250 MG tablet, Take 500 mg by mouth daily., Disp: , Rfl:  .  Vitamin D, Ergocalciferol, (DRISDOL) 50000 UNITS CAPS capsule, Take 50,000 Units by mouth every 7 (seven) days. Sundays, Disp: , Rfl:   Allergies:  Allergies  Allergen Reactions  . Sulfa Antibiotics Itching    Past Medical History, Surgical history, Social history, and Family History were reviewed and updated.  Review of Systems: As above  Physical Exam:  height is 5\' 11"  (1.803 m) and weight is 143 lb (64.864 kg). His oral temperature is 97.9 F (36.6 C). His blood pressure is 153/72 and his pulse is 75. His respiration is 16.   Thin, elderly gentleman. He is no distress. Head and neck exam shows no ocular or oral lesions. He has no palpable cervical or supraclavicular lymph nodes. Lungs are clear bilaterally. Cardiac exam is regular rate and rhythm with no murmurs rubs or bruits. Abdomen is soft. He has good bowel sounds. There is no fluid wave. There is no palpable liver or spleen.  Back exam no tenderness over the spine ribs or hips. He has some kyphosis. Extremities shows some age related changes. He has good range of motion and strength. Skin exam shows no rashes. Neurological exam is nonfocal.  Lab Results  Component Value Date   WBC 4.6 02/02/2016   HGB 9.8* 02/02/2016   HCT 28.9* 02/02/2016   MCV 101* 02/02/2016   PLT 136* 02/02/2016     Chemistry      Component Value Date/Time   NA 135 02/02/2016 1320   NA 140 07/15/2014 1345   K 4.2 02/02/2016 1320   K 4.6 07/15/2014 1345   CL 107 02/02/2016 1320   CL 106 07/15/2014 1345   CO2 29 02/02/2016 1320   CO2 27 07/15/2014 1345   BUN 31* 02/02/2016  1320   BUN 18 07/15/2014 1345   CREATININE 1.4* 02/02/2016 1320   CREATININE 1.41* 07/15/2014 1345      Component Value Date/Time   CALCIUM 10.1 02/02/2016 1320   CALCIUM 8.7 07/15/2014 1345   ALKPHOS 82 02/02/2016 1320   ALKPHOS 101 07/15/2014 1345   AST 28 02/02/2016 1320   AST 18 07/15/2014 1345   ALT 22 02/02/2016 1320   ALT 15 07/15/2014 1345   BILITOT 0.80 02/02/2016 1320   BILITOT 1.3* 07/15/2014 1345       Impression and Plan: Christian Burns is an 82 year old gentleman with IgG lambda myeloma.   I am happy that he really is not hurting much. This is certainly a good sign for him. In addition, his calcium has stayed down.  We will go ahead and give him Zometa today. I think this is very helpful.  I think that it is encouraging that his calcium is not trended back upward.  We will see what his myeloma studies show. Last ones were fairly stable.  I told him to let me know when he has the surgery for the left side of his face .  We can was adjust his Ninlaro/Cytoxan protocol.   I want to make sure that his quality of life is good.  I will plan to see him back in another 6 weeks.  I spent about 25-30 minutes with him.[name], MD 6/23/20171:58 PM

## 2016-02-05 LAB — KAPPA/LAMBDA LIGHT CHAINS
IG KAPPA FREE LIGHT CHAIN: 15.1 mg/L (ref 3.3–19.4)
IG LAMBDA FREE LIGHT CHAIN: 858.1 mg/L — AB (ref 5.7–26.3)
KAPPA/LAMBDA FLC RATIO: 0.02 — AB (ref 0.26–1.65)

## 2016-02-06 LAB — MULTIPLE MYELOMA PANEL, SERUM
ALBUMIN SERPL ELPH-MCNC: 3.5 g/dL (ref 2.9–4.4)
Albumin/Glob SerPl: 0.7 (ref 0.7–1.7)
Alpha 1: 0.3 g/dL (ref 0.0–0.4)
Alpha2 Glob SerPl Elph-Mcnc: 0.7 g/dL (ref 0.4–1.0)
B-Globulin SerPl Elph-Mcnc: 0.9 g/dL (ref 0.7–1.3)
Gamma Glob SerPl Elph-Mcnc: 3.1 g/dL — ABNORMAL HIGH (ref 0.4–1.8)
Globulin, Total: 5.1 g/dL — ABNORMAL HIGH (ref 2.2–3.9)
IgA, Qn, Serum: 107 mg/dL (ref 61–437)
IgG, Qn, Serum: 3975 mg/dL — ABNORMAL HIGH (ref 700–1600)
IgM, Qn, Serum: 30 mg/dL (ref 15–143)
M PROTEIN SERPL ELPH-MCNC: 2.9 g/dL — AB
TOTAL PROTEIN: 8.6 g/dL — AB (ref 6.0–8.5)

## 2016-02-15 ENCOUNTER — Other Ambulatory Visit: Payer: Self-pay | Admitting: *Deleted

## 2016-02-15 ENCOUNTER — Telehealth: Payer: Self-pay | Admitting: *Deleted

## 2016-02-15 DIAGNOSIS — C9000 Multiple myeloma not having achieved remission: Secondary | ICD-10-CM

## 2016-02-15 DIAGNOSIS — M25551 Pain in right hip: Secondary | ICD-10-CM

## 2016-02-15 MED ORDER — IXAZOMIB CITRATE 4 MG PO CAPS: 4.0000 mg | ORAL_CAPSULE | ORAL | Status: DC

## 2016-02-15 NOTE — Telephone Encounter (Signed)
Patient called asking if we could send next script to [facility] instead of [facility].  He has contacted [facility] and they said that there is not a problem and his copay would be 0.  Patient is off medication for 2 weeks so rx sent to [facility].

## 2016-02-22 ENCOUNTER — Other Ambulatory Visit: Payer: Self-pay

## 2016-02-22 DIAGNOSIS — C9000 Multiple myeloma not having achieved remission: Secondary | ICD-10-CM

## 2016-02-22 DIAGNOSIS — M25551 Pain in right hip: Secondary | ICD-10-CM

## 2016-02-22 MED ORDER — IXAZOMIB CITRATE 4 MG PO CAPS: 4.0000 mg | ORAL_CAPSULE | ORAL | Status: DC

## 2016-02-27 DIAGNOSIS — C44319 Basal cell carcinoma of skin of other parts of face: Secondary | ICD-10-CM | POA: Diagnosis not present

## 2016-03-07 DIAGNOSIS — H26491 Other secondary cataract, right eye: Secondary | ICD-10-CM | POA: Diagnosis not present

## 2016-03-07 DIAGNOSIS — H04123 Dry eye syndrome of bilateral lacrimal glands: Secondary | ICD-10-CM | POA: Diagnosis not present

## 2016-03-07 DIAGNOSIS — H01001 Unspecified blepharitis right upper eyelid: Secondary | ICD-10-CM | POA: Diagnosis not present

## 2016-03-07 DIAGNOSIS — H01004 Unspecified blepharitis left upper eyelid: Secondary | ICD-10-CM | POA: Diagnosis not present

## 2016-03-07 DIAGNOSIS — H01002 Unspecified blepharitis right lower eyelid: Secondary | ICD-10-CM | POA: Diagnosis not present

## 2016-03-07 DIAGNOSIS — H01005 Unspecified blepharitis left lower eyelid: Secondary | ICD-10-CM | POA: Diagnosis not present

## 2016-03-07 DIAGNOSIS — H524 Presbyopia: Secondary | ICD-10-CM | POA: Diagnosis not present

## 2016-03-14 ENCOUNTER — Encounter: Payer: Self-pay | Admitting: Hematology & Oncology

## 2016-03-14 ENCOUNTER — Other Ambulatory Visit (HOSPITAL_BASED_OUTPATIENT_CLINIC_OR_DEPARTMENT_OTHER): Payer: Medicare HMO

## 2016-03-14 ENCOUNTER — Ambulatory Visit (HOSPITAL_BASED_OUTPATIENT_CLINIC_OR_DEPARTMENT_OTHER): Payer: Medicare HMO | Admitting: Hematology & Oncology

## 2016-03-14 ENCOUNTER — Ambulatory Visit (HOSPITAL_BASED_OUTPATIENT_CLINIC_OR_DEPARTMENT_OTHER): Payer: Medicare HMO

## 2016-03-14 VITALS — BP 142/72 | HR 91 | Temp 97.5°F | Resp 18 | Ht 71.0 in | Wt 142.0 lb

## 2016-03-14 DIAGNOSIS — C9000 Multiple myeloma not having achieved remission: Secondary | ICD-10-CM

## 2016-03-14 DIAGNOSIS — G620 Drug-induced polyneuropathy: Secondary | ICD-10-CM

## 2016-03-14 LAB — CBC WITH DIFFERENTIAL (CANCER CENTER ONLY)
BASO#: 0 10*3/uL (ref 0.0–0.2)
BASO%: 0.1 % (ref 0.0–2.0)
EOS ABS: 0 10*3/uL (ref 0.0–0.5)
EOS%: 0.4 % (ref 0.0–7.0)
HCT: 24.5 % — ABNORMAL LOW (ref 38.7–49.9)
HGB: 8.6 g/dL — ABNORMAL LOW (ref 13.0–17.1)
LYMPH#: 0.2 10*3/uL — AB (ref 0.9–3.3)
LYMPH%: 2.4 % — ABNORMAL LOW (ref 14.0–48.0)
MCH: 34.4 pg — AB (ref 28.0–33.4)
MCHC: 35.1 g/dL (ref 32.0–35.9)
MCV: 98 fL (ref 82–98)
MONO#: 1.2 10*3/uL — AB (ref 0.1–0.9)
MONO%: 11.6 % (ref 0.0–13.0)
NEUT%: 85.5 % — ABNORMAL HIGH (ref 40.0–80.0)
NEUTROS ABS: 8.6 10*3/uL — AB (ref 1.5–6.5)
PLATELETS: 134 10*3/uL — AB (ref 145–400)
RBC: 2.5 10*6/uL — AB (ref 4.20–5.70)
RDW: 13.6 % (ref 11.1–15.7)
WBC: 10 10*3/uL (ref 4.0–10.0)

## 2016-03-14 LAB — CMP (CANCER CENTER ONLY)
ALK PHOS: 76 U/L (ref 26–84)
ALT(SGPT): 22 U/L (ref 10–47)
AST: 26 U/L (ref 11–38)
Albumin: 2.7 g/dL — ABNORMAL LOW (ref 3.3–5.5)
BILIRUBIN TOTAL: 0.8 mg/dL (ref 0.20–1.60)
BUN, Bld: 35 mg/dL — ABNORMAL HIGH (ref 7–22)
CHLORIDE: 106 meq/L (ref 98–108)
CO2: 26 meq/L (ref 18–33)
Calcium: 10.1 mg/dL (ref 8.0–10.3)
Creat: 1.9 mg/dl — ABNORMAL HIGH (ref 0.6–1.2)
GLUCOSE: 112 mg/dL (ref 73–118)
POTASSIUM: 4.1 meq/L (ref 3.3–4.7)
SODIUM: 134 meq/L (ref 128–145)
Total Protein: 8.8 g/dL — ABNORMAL HIGH (ref 6.4–8.1)

## 2016-03-14 MED ORDER — SODIUM CHLORIDE 0.9 % IV SOLN
Freq: Once | INTRAVENOUS | Status: AC
  Administered 2016-03-14: 15:00:00 via INTRAVENOUS

## 2016-03-14 NOTE — Patient Instructions (Signed)

## 2016-03-14 NOTE — Progress Notes (Signed)
Hematology and Oncology Follow Up Visit  Christian Burns [id number] 02-Mar-1930 82 y.o. 03/14/2016   Principle Diagnosis:   Recurrent IgG lambda myeloma - progressive  Hypercalcemia of malignancy.  Current Therapy:    Cytoxan 250mg  po q wk (3/1)/Ixazomib 4mg  po q week (3/1) - s/p cycle #2  Zometa 3.3 mg IV q. 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  Burns is back for followup. He looks quite good. He feels good. He has wife have an apartment in [place]. He is very active. His wife, unfortunately, is debilitated by arthritis and is in a wheelchair.   He recently had some surgery for a skin cancer on the left side of his face.  His myeloma studies have been creeping up a little bit.  We'll last saw him in June, his M spike was 2.9 g/L. His IgG level was almost 4000 mg/L. His lambda light chain was 85.8 mg/dL.   He seems to be tolerating the Cytoxan/Ninlaro combination fairly well.   He seems to be eating okay. He's had no nausea or vomiting. 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   He's had no cough. He's had no rashes. He's had no bleeding or bruising.   Overall, his performance status is ECOG 2.  Medications:  Current Outpatient Prescriptions:  .  aspirin EC 81 MG tablet, Take 81 mg by mouth daily. , Disp: , Rfl:  .  Calcium Carb-Cholecalciferol (CALCIUM 600 + D PO), Take by mouth every morning., Disp: , Rfl:  .  Cyanocobalamin (VITAMIN B 12 PO), Take 1,000 mcg by mouth every morning. , Disp: , Rfl:  .  cyclophosphamide (CYTOXAN) 50 MG tablet, Take 5 capsules on an empty stomach once a week for 3 weeks, then off 1 week, Disp: 15 tablet, Rfl: 6 .  dexamethasone (DECADRON) 4 MG tablet, Take 5 pills, WITH FOOD, once a week with your chemotherapy pills., Disp: 60 tablet, Rfl: 3 .  HYDROcodone-acetaminophen (NORCO/VICODIN) 5-325 MG tablet, Take 1 tablet by mouth every 6 (six) hours as needed for moderate pain.,  Disp: 60 tablet, Rfl: 0 .  ixazomib citrate (NINLARO) 4 MG capsule, Take 1 capsule (4 mg total) by mouth See admin instructions. Take on empty stomach 1hr before food. Day #1, # 8, #15, then day # 28 repeat., Disp: 3 capsule, Rfl: 2 .  Multiple Vitamins-Minerals (MULTIVITAL PO), Take 1 tablet by mouth every morning. , Disp: , Rfl:  .  Pyridoxine HCl (VITAMIN B-6) 250 MG tablet, Take 500 mg by mouth daily., Disp: , Rfl:  .  Vitamin D, Ergocalciferol, (DRISDOL) 50000 UNITS CAPS capsule, Take 50,000 Units by mouth every 7 (seven) days. Sundays, Disp: , Rfl:   Allergies:  Allergies  Allergen Reactions  . Sulfa Antibiotics Itching    Past Medical History, Surgical history, Social history, and Family History were reviewed and updated.  Review of Systems: As above  Physical Exam:  height is 5\' 11"  (1.803 m) and weight is 142 lb (64.4 kg). His oral temperature is 97.5 F (36.4 C). His blood pressure is 142/72 (abnormal) and his pulse is 91. His respiration is 18.   Thin, elderly gentleman. He is no distress. Head and neck exam shows no ocular or oral lesions. He has no palpable cervical or supraclavicular lymph nodes. Lungs are clear bilaterally. Cardiac exam is regular rate and rhythm with no murmurs rubs or bruits. Abdomen is soft. He  has good bowel sounds. There is no fluid wave. There is no palpable liver or spleen. Back exam no tenderness over the spine ribs or hips. He has some kyphosis. Extremities shows some age related changes. He has good range of motion and strength. Skin exam shows no rashes. Neurological exam is nonfocal.  Lab Results  Component Value Date   WBC 10.0 03/14/2016   HGB 8.6 (L) 03/14/2016   HCT 24.5 (L) 03/14/2016   MCV 98 03/14/2016   PLT 134 (L) 03/14/2016     Chemistry      Component Value Date/Time   NA 134 03/14/2016 1345   K 4.1 03/14/2016 1345   CL 106 03/14/2016 1345   CO2 26 03/14/2016 1345   BUN 35 (H) 03/14/2016 1345   CREATININE 1.9 (H) 03/14/2016  1345      Component Value Date/Time   CALCIUM 10.1 03/14/2016 1345   ALKPHOS 76 03/14/2016 1345   AST 26 03/14/2016 1345   ALT 22 03/14/2016 1345   BILITOT 0.80 03/14/2016 1345       Impression and Plan: Christian Burns is an 82 year old gentleman with IgG lambda myeloma.   I am somewhat troubled by the trend with his myeloma. Again, he is not that symptomatic. I think any changes that we make in his therapy certainly would have more side effects for him.  We will see what his myeloma was numbers are now. I think if they are further elevated, we will have to make a change in his protocol. I'll try to avoid IV therapy as he wants to try to be at home was much as he can for his wife.  He has had Pomalidomide in the past.  He has not had Kyprolis. This may be an option for [place]. This could be done weekly.  One of the new monoclonal antibodies could also be utilized.  His hemoglobin is down a little bit. I do worry about that. We will have to be very careful with this.  I would like to get him back here in 3 weeks. I think this would be reasonable.  I spent about 35 minutes with him today. I will give her some IV fluids. I think by his numbers, he may be a little bit hypovolemic. He will not get Zometa.   Christian Napoleon, MD 8/3/20176:02 PM

## 2016-03-15 LAB — KAPPA/LAMBDA LIGHT CHAINS
IG KAPPA FREE LIGHT CHAIN: 14.2 mg/L (ref 3.3–19.4)
Ig Lambda Free Light Chain: 1202.2 mg/L — ABNORMAL HIGH (ref 5.7–26.3)
Kappa/Lambda FluidC Ratio: 0.01 — ABNORMAL LOW (ref 0.26–1.65)

## 2016-03-18 LAB — MULTIPLE MYELOMA PANEL, SERUM
ALBUMIN SERPL ELPH-MCNC: 3.4 g/dL (ref 2.9–4.4)
Albumin/Glob SerPl: 0.7 (ref 0.7–1.7)
Alpha 1: 0.3 g/dL (ref 0.0–0.4)
Alpha2 Glob SerPl Elph-Mcnc: 0.8 g/dL (ref 0.4–1.0)
B-Globulin SerPl Elph-Mcnc: 0.9 g/dL (ref 0.7–1.3)
GAMMA GLOB SERPL ELPH-MCNC: 3.2 g/dL — AB (ref 0.4–1.8)
GLOBULIN, TOTAL: 5.2 g/dL — AB (ref 2.2–3.9)
IgA, Qn, Serum: 101 mg/dL (ref 61–437)
IgG, Qn, Serum: 4441 mg/dL — ABNORMAL HIGH (ref 700–1600)
IgM, Qn, Serum: 25 mg/dL (ref 15–143)
M Protein SerPl Elph-Mcnc: 3.1 g/dL — ABNORMAL HIGH
TOTAL PROTEIN: 8.6 g/dL — AB (ref 6.0–8.5)

## 2016-04-04 ENCOUNTER — Other Ambulatory Visit: Payer: Medicare HMO

## 2016-04-04 ENCOUNTER — Ambulatory Visit: Payer: Medicare HMO | Admitting: Hematology & Oncology

## 2016-04-04 DIAGNOSIS — H01005 Unspecified blepharitis left lower eyelid: Secondary | ICD-10-CM | POA: Diagnosis not present

## 2016-04-04 DIAGNOSIS — H01004 Unspecified blepharitis left upper eyelid: Secondary | ICD-10-CM | POA: Diagnosis not present

## 2016-04-04 DIAGNOSIS — H26492 Other secondary cataract, left eye: Secondary | ICD-10-CM | POA: Diagnosis not present

## 2016-04-04 DIAGNOSIS — H01002 Unspecified blepharitis right lower eyelid: Secondary | ICD-10-CM | POA: Diagnosis not present

## 2016-04-04 DIAGNOSIS — H01001 Unspecified blepharitis right upper eyelid: Secondary | ICD-10-CM | POA: Diagnosis not present

## 2016-04-04 DIAGNOSIS — H04123 Dry eye syndrome of bilateral lacrimal glands: Secondary | ICD-10-CM | POA: Diagnosis not present

## 2016-04-04 DIAGNOSIS — H524 Presbyopia: Secondary | ICD-10-CM | POA: Diagnosis not present

## 2016-04-05 ENCOUNTER — Ambulatory Visit (HOSPITAL_COMMUNITY)
Admission: RE | Admit: 2016-04-05 | Discharge: 2016-04-05 | Disposition: A | Discharge status: Home / Self Care | Payer: Medicare HMO | Source: Ambulatory Visit | Attending: Hematology & Oncology | Admitting: Hematology & Oncology

## 2016-04-05 ENCOUNTER — Ambulatory Visit (HOSPITAL_BASED_OUTPATIENT_CLINIC_OR_DEPARTMENT_OTHER): Payer: Medicare HMO | Admitting: Hematology & Oncology

## 2016-04-05 ENCOUNTER — Other Ambulatory Visit (HOSPITAL_BASED_OUTPATIENT_CLINIC_OR_DEPARTMENT_OTHER): Payer: Medicare HMO

## 2016-04-05 ENCOUNTER — Ambulatory Visit (HOSPITAL_BASED_OUTPATIENT_CLINIC_OR_DEPARTMENT_OTHER): Payer: Medicare HMO

## 2016-04-05 ENCOUNTER — Encounter: Payer: Self-pay | Admitting: Hematology & Oncology

## 2016-04-05 VITALS — BP 124/47 | HR 80 | Temp 98.0°F | Resp 16 | Ht 71.0 in | Wt 144.0 lb

## 2016-04-05 VITALS — BP 134/60 | HR 71 | Temp 97.7°F | Resp 16

## 2016-04-05 DIAGNOSIS — C9000 Multiple myeloma not having achieved remission: Secondary | ICD-10-CM | POA: Diagnosis not present

## 2016-04-05 DIAGNOSIS — C9002 Multiple myeloma in relapse: Secondary | ICD-10-CM | POA: Insufficient documentation

## 2016-04-05 HISTORY — DX: Multiple myeloma in relapse: C90.02

## 2016-04-05 LAB — CMP (CANCER CENTER ONLY)
ALBUMIN: 2.6 g/dL — AB (ref 3.3–5.5)
ALK PHOS: 72 U/L (ref 26–84)
ALT(SGPT): 13 U/L (ref 10–47)
AST: 26 U/L (ref 11–38)
BUN, Bld: 37 mg/dL — ABNORMAL HIGH (ref 7–22)
CALCIUM: 10.7 mg/dL — AB (ref 8.0–10.3)
CO2: 26 meq/L (ref 18–33)
Chloride: 105 mEq/L (ref 98–108)
Creat: 1.8 mg/dl — ABNORMAL HIGH (ref 0.6–1.2)
GLUCOSE: 113 mg/dL (ref 73–118)
POTASSIUM: 3.8 meq/L (ref 3.3–4.7)
Sodium: 133 mEq/L (ref 128–145)
Total Bilirubin: 0.8 mg/dl (ref 0.20–1.60)
Total Protein: 8.6 g/dL — ABNORMAL HIGH (ref 6.4–8.1)

## 2016-04-05 LAB — CBC WITH DIFFERENTIAL (CANCER CENTER ONLY)
BASO#: 0 10*3/uL (ref 0.0–0.2)
BASO%: 0.4 % (ref 0.0–2.0)
EOS ABS: 0.1 10*3/uL (ref 0.0–0.5)
EOS%: 2.4 % (ref 0.0–7.0)
HEMATOCRIT: 23.9 % — AB (ref 38.7–49.9)
HEMOGLOBIN: 8.2 g/dL — AB (ref 13.0–17.1)
LYMPH#: 0.2 10*3/uL — AB (ref 0.9–3.3)
LYMPH%: 3.3 % — ABNORMAL LOW (ref 14.0–48.0)
MCH: 33.9 pg — ABNORMAL HIGH (ref 28.0–33.4)
MCHC: 34.3 g/dL (ref 32.0–35.9)
MCV: 99 fL — AB (ref 82–98)
MONO#: 0.9 10*3/uL (ref 0.1–0.9)
MONO%: 15.6 % — ABNORMAL HIGH (ref 0.0–13.0)
NEUT#: 4.3 10*3/uL (ref 1.5–6.5)
NEUT%: 78.3 % (ref 40.0–80.0)
Platelets: 112 10*3/uL — ABNORMAL LOW (ref 145–400)
RBC: 2.42 10*6/uL — ABNORMAL LOW (ref 4.20–5.70)
RDW: 14.5 % (ref 11.1–15.7)
WBC: 5.5 10*3/uL (ref 4.0–10.0)

## 2016-04-05 LAB — ABO/RH: ABO/RH(D): O POS

## 2016-04-05 LAB — PREPARE RBC (CROSSMATCH)

## 2016-04-05 LAB — LACTATE DEHYDROGENASE: LDH: 198 U/L (ref 125–245)

## 2016-04-05 MED ORDER — ZOLEDRONIC ACID 4 MG/100ML IV SOLN
4.0000 mg | Freq: Once | INTRAVENOUS | Status: AC
  Administered 2016-04-05: 4 mg via INTRAVENOUS
  Filled 2016-04-05: qty 100

## 2016-04-05 MED ORDER — SODIUM CHLORIDE 0.9 % IV SOLN
Freq: Once | INTRAVENOUS | Status: AC
  Administered 2016-04-05: 12:00:00 via INTRAVENOUS

## 2016-04-05 NOTE — Progress Notes (Signed)
Hematology and Oncology Follow Up Visit  POWER ARNET [id number] [birth year] 82 y.o. 04/05/2016   Principle Diagnosis:   Recurrent IgG lambda myeloma - progressive  Hypercalcemia of malignancy.  Current Therapy:        Kyprolis/Cytoxan - start on 9/8  Cytoxan 250mg  po q wk (3/1)/Ixazomib 4mg  po q week (3/1) - s/p cycle #4 - progression on 04/05/2016  Zometa 3.3 mg IV q. 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  Kehm is back for followup.he looks pretty good. He feels pretty good. He is still enjoying the move to assisted living. His wife, who is crippled with arthritis seems to be getting around a little bit better.  Unfortunately, it is becoming obvious that his myeloma is worsening. His last M spike a few weeks ago was up to 3.1 g/dL. His IgG level was 4441 mg/dL. His lambda light chain was already up to 1200 mg/L.   Thankfully, he's been tolerating the Cytoxan/Ninlaro pretty well. He's had her little toxicity from this.  He is urinating okay. He's having no problems with bowels or bladder. He's had no cough. He's had no fever. He's had no bleeding.  His appetite is down a little bit. I don't think he's lost any weight.  He's had no headache.  He has a neuropathy in his feet area and this is about the same.  Overall, I think his performance status is probably ECOG 2.   Medications:  Current Outpatient Prescriptions:  .  aspirin EC 81 MG tablet, Take 81 mg by mouth daily. , Disp: , Rfl:  .  Calcium Carb-Cholecalciferol (CALCIUM 600 + D PO), Take by mouth every morning., Disp: , Rfl:  .  Cyanocobalamin (VITAMIN B 12 PO), Take 1,000 mcg by mouth every morning. , Disp: , Rfl:  .  cyclophosphamide (CYTOXAN) 50 MG tablet, Take 5 capsules on an empty stomach once a week for 3 weeks, then off 1 week, Disp: 15 tablet, Rfl: 6 .  dexamethasone (DECADRON) 4 MG tablet, Take 5 pills, WITH FOOD, once a week with your chemotherapy pills., Disp:  60 tablet, Rfl: 3 .  HYDROcodone-acetaminophen (NORCO/VICODIN) 5-325 MG tablet, Take 1 tablet by mouth every 6 (six) hours as needed for moderate pain., Disp: 60 tablet, Rfl: 0 .  ixazomib citrate (NINLARO) 4 MG capsule, Take 1 capsule (4 mg total) by mouth See admin instructions. Take on empty stomach 1hr before food. Day #1, # 8, #15, then day # 28 repeat., Disp: 3 capsule, Rfl: 2 .  Multiple Vitamins-Minerals (MULTIVITAL PO), Take 1 tablet by mouth every morning. , Disp: , Rfl:  .  Pyridoxine HCl (VITAMIN B-6) 250 MG tablet, Take 500 mg by mouth daily., Disp: , Rfl:  .  Vitamin D, Ergocalciferol, (DRISDOL) 50000 UNITS CAPS capsule, Take 50,000 Units by mouth every 7 (seven) days. Sundays, Disp: , Rfl:   Allergies:  Allergies  Allergen Reactions  . Sulfa Antibiotics Itching    Past Medical History, Surgical history, Social history, and Family History were reviewed and updated.  Review of Systems: As above  Physical Exam:  height is 5\' 11"  (1.803 m) and weight is 144 lb (65.3 kg). His oral temperature is 98 F (36.7 C). His blood pressure is 124/47 (abnormal) and his pulse is 80. His respiration is 16.   Thin, elderly gentleman. He is no distress. Head and neck exam shows no ocular or oral lesions. He has no palpable cervical  or supraclavicular lymph nodes. Lungs are clear bilaterally. Cardiac exam is regular rate and rhythm with no murmurs rubs or bruits. Abdomen is soft. He has good bowel sounds. There is no fluid wave. There is no palpable liver or spleen. Back exam no tenderness over the spine ribs or hips. He has some kyphosis. Extremities shows some age related changes. He has good range of motion and strength. Skin exam shows no rashes. Neurological exam is nonfocal.  Lab Results  Component Value Date   WBC 5.5 04/05/2016   HGB 8.2 (L) 04/05/2016   HCT 23.9 (L) 04/05/2016   MCV 99 (H) 04/05/2016   PLT 112 (L) 04/05/2016     Chemistry      Component Value Date/Time   NA 133  04/05/2016 1022   K 3.8 04/05/2016 1022   CL 105 04/05/2016 1022   CO2 26 04/05/2016 1022   BUN 37 (H) 04/05/2016 1022   CREATININE 1.8 (H) 04/05/2016 1022      Component Value Date/Time   CALCIUM 10.7 (H) 04/05/2016 1022   ALKPHOS 72 04/05/2016 1022   AST 26 04/05/2016 1022   ALT 13 04/05/2016 1022   BILITOT 0.80 04/05/2016 1022       Impression and Plan: Mr. Graulich is an 82 year old gentleman with IgG lambda myeloma.   I Think that is obvious that his myeloma is not responding to the the Cytoxan/Ninlaro now. His protein levels are going up.  He still has a good performance status. As such, I really think that it would be worthwhile trying to get him on Cytoxan/Kyprolis. I think this would be very reasonable. I think he would tolerate this. I know that this is IV but this should be effective.  We can always try Darzalex. However, I think this may have the potential for more side effects.  He definitely will need a blood transfusion. We will give him 1 unit of blood. This might be from myeloma infiltrating his bone marrow. It might be from his treatments. His renal function is down a little bit. I'm sure that his erythropoietin level is probably down. I will have to see about checking this.  I taught him at length about the fact that his myeloma is worsening. He does understand this. He is willing to try something different. He still wants to make sure that he is around to help his wife he was cripple with arthritis.  I will see by getting him started after Labor Day.  I told him to stop the Cytoxan and Ninlaro. He does understand this.  I will go ahead and give him Zometa today. I think this may help.  We'll plan to get him back to see [place] in about 3 or 4 weeks. We will have to follow his labs closely.     [name], MD 8/25/20176:03 PM

## 2016-04-05 NOTE — Patient Instructions (Signed)

## 2016-04-06 LAB — IGG, IGA, IGM
IGM (IMMUNOGLOBIN M), SRM: 25 mg/dL (ref 15–143)
IgA, Qn, Serum: 90 mg/dL (ref 61–437)
IgG, Qn, Serum: 4165 mg/dL — ABNORMAL HIGH (ref 700–1600)

## 2016-04-08 LAB — TYPE AND SCREEN
ABO/RH(D): O POS
Antibody Screen: NEGATIVE
Unit division: 0

## 2016-04-08 LAB — KAPPA/LAMBDA LIGHT CHAINS
IG LAMBDA FREE LIGHT CHAIN: 979.2 mg/L — AB (ref 5.7–26.3)
Ig Kappa Free Light Chain: 13.7 mg/L (ref 3.3–19.4)
Kappa/Lambda FluidC Ratio: 0.01 — ABNORMAL LOW (ref 0.26–1.65)

## 2016-04-09 ENCOUNTER — Encounter: Payer: Self-pay | Admitting: Hematology & Oncology

## 2016-04-17 ENCOUNTER — Other Ambulatory Visit: Payer: Self-pay | Admitting: *Deleted

## 2016-04-17 DIAGNOSIS — C9002 Multiple myeloma in relapse: Secondary | ICD-10-CM

## 2016-04-18 ENCOUNTER — Other Ambulatory Visit: Payer: Self-pay

## 2016-04-18 ENCOUNTER — Other Ambulatory Visit (HOSPITAL_BASED_OUTPATIENT_CLINIC_OR_DEPARTMENT_OTHER): Payer: Medicare HMO

## 2016-04-18 ENCOUNTER — Ambulatory Visit (HOSPITAL_BASED_OUTPATIENT_CLINIC_OR_DEPARTMENT_OTHER): Payer: Medicare HMO

## 2016-04-18 VITALS — BP 149/73 | HR 72 | Temp 97.8°F | Resp 18

## 2016-04-18 DIAGNOSIS — C9000 Multiple myeloma not having achieved remission: Secondary | ICD-10-CM

## 2016-04-18 DIAGNOSIS — C9002 Multiple myeloma in relapse: Secondary | ICD-10-CM

## 2016-04-18 DIAGNOSIS — Z5112 Encounter for antineoplastic immunotherapy: Secondary | ICD-10-CM

## 2016-04-18 LAB — CMP (CANCER CENTER ONLY)
ALBUMIN: 2.5 g/dL — AB (ref 3.3–5.5)
ALK PHOS: 86 U/L — AB (ref 26–84)
ALT: 15 U/L (ref 10–47)
AST: 29 U/L (ref 11–38)
BUN: 24 mg/dL — AB (ref 7–22)
CALCIUM: 11.1 mg/dL — AB (ref 8.0–10.3)
CHLORIDE: 111 meq/L — AB (ref 98–108)
CO2: 23 mEq/L (ref 18–33)
Creat: 1.6 mg/dl — ABNORMAL HIGH (ref 0.6–1.2)
Glucose, Bld: 93 mg/dL (ref 73–118)
POTASSIUM: 4.1 meq/L (ref 3.3–4.7)
Sodium: 133 mEq/L (ref 128–145)
TOTAL PROTEIN: 9.3 g/dL — AB (ref 6.4–8.1)
Total Bilirubin: 0.9 mg/dl (ref 0.20–1.60)

## 2016-04-18 LAB — CBC WITH DIFFERENTIAL (CANCER CENTER ONLY)
BASO#: 0 10*3/uL (ref 0.0–0.2)
BASO%: 1.1 % (ref 0.0–2.0)
EOS ABS: 0.2 10*3/uL (ref 0.0–0.5)
EOS%: 5.1 % (ref 0.0–7.0)
HEMATOCRIT: 26.8 % — AB (ref 38.7–49.9)
HEMOGLOBIN: 9.2 g/dL — AB (ref 13.0–17.1)
LYMPH#: 0.3 10*3/uL — AB (ref 0.9–3.3)
LYMPH%: 8.9 % — AB (ref 14.0–48.0)
MCH: 33.8 pg — AB (ref 28.0–33.4)
MCHC: 34.3 g/dL (ref 32.0–35.9)
MCV: 99 fL — AB (ref 82–98)
MONO#: 0.6 10*3/uL (ref 0.1–0.9)
MONO%: 15.6 % — ABNORMAL HIGH (ref 0.0–13.0)
NEUT%: 69.3 % (ref 40.0–80.0)
NEUTROS ABS: 2.6 10*3/uL (ref 1.5–6.5)
Platelets: 138 10*3/uL — ABNORMAL LOW (ref 145–400)
RBC: 2.72 10*6/uL — AB (ref 4.20–5.70)
RDW: 14.4 % (ref 11.1–15.7)
WBC: 3.7 10*3/uL — AB (ref 4.0–10.0)

## 2016-04-18 MED ORDER — PROCHLORPERAZINE MALEATE 10 MG PO TABS: 10.0000 mg | ORAL_TABLET | Freq: Four times a day (QID) | ORAL | 1 refills | Status: DC | PRN

## 2016-04-18 MED ORDER — SODIUM CHLORIDE 0.9 % IV SOLN
Freq: Once | INTRAVENOUS | Status: AC
  Administered 2016-04-18: 10:00:00 via INTRAVENOUS

## 2016-04-18 MED ORDER — ONDANSETRON HCL 8 MG PO TABS: 8.0000 mg | ORAL_TABLET | Freq: Two times a day (BID) | ORAL | 1 refills | Status: DC | PRN

## 2016-04-18 MED ORDER — SODIUM CHLORIDE 0.9 % IV SOLN
10.0000 mg | Freq: Once | INTRAVENOUS | Status: AC
  Administered 2016-04-18: 10 mg via INTRAVENOUS
  Filled 2016-04-18: qty 1

## 2016-04-18 MED ORDER — LORAZEPAM 0.5 MG PO TABS: 0.5000 mg | ORAL_TABLET | Freq: Four times a day (QID) | ORAL | 0 refills | Status: DC | PRN

## 2016-04-18 MED ORDER — PALONOSETRON HCL INJECTION 0.25 MG/5ML
0.2500 mg | Freq: Once | INTRAVENOUS | Status: AC
  Administered 2016-04-18: 0.25 mg via INTRAVENOUS

## 2016-04-18 MED ORDER — DEXTROSE 5 % IV SOLN
20.0000 mg/m2 | Freq: Once | INTRAVENOUS | Status: AC
  Administered 2016-04-18: 36 mg via INTRAVENOUS
  Filled 2016-04-18: qty 18

## 2016-04-18 MED ORDER — SODIUM CHLORIDE 0.9 % IV SOLN
240.0000 mg/m2 | Freq: Once | INTRAVENOUS | Status: AC
  Administered 2016-04-18: 440 mg via INTRAVENOUS
  Filled 2016-04-18: qty 22

## 2016-04-18 MED ORDER — PALONOSETRON HCL INJECTION 0.25 MG/5ML
INTRAVENOUS | Status: AC
  Filled 2016-04-18: qty 5

## 2016-04-18 NOTE — Patient Instructions (Addendum)
Carfilzomib injection What is this medicine? CARFILZOMIB (kar FILZ oh mib) targets a specific protein within cancer cells and stops the cancer cells from growing. It is used to treat multiple myeloma. This medicine may be used for other purposes; ask your health care provider or pharmacist if you have questions. What should I tell my health care provider before I take this medicine? They need to know if you have any of these conditions: -heart disease -history of blood clots -irregular heartbeat -kidney disease -liver disease -lung or breathing disease -an unusual or allergic reaction to carfilzomib, or other medicines, foods, dyes, or preservatives -pregnant or trying to get pregnant -breast-feeding How should I use this medicine? This medicine is for injection or infusion into a vein. It is given by a health care professional in a hospital or clinic setting. Talk to your pediatrician regarding the use of this medicine in children. Special care may be needed. Overdosage: If you think you have taken too much of this medicine contact a poison control center or emergency room at once. NOTE: This medicine is only for you. Do not share this medicine with others. What if I miss a dose? It is important not to miss your dose. Call your doctor or health care professional if you are unable to keep an appointment. What may interact with this medicine? Interactions are not expected. Give your health care provider a list of all the medicines, herbs, non-prescription drugs, or dietary supplements you use. Also tell them if you smoke, drink alcohol, or use illegal drugs. Some items may interact with your medicine. This list may not describe all possible interactions. Give your health care provider a list of all the medicines, herbs, non-prescription drugs, or dietary supplements you use. Also tell them if you smoke, drink alcohol, or use illegal drugs. Some items may interact with your medicine. What  should I watch for while using this medicine? Your condition will be monitored carefully while you are receiving this medicine. Report any side effects. Continue your course of treatment even though you feel ill unless your doctor tells you to stop. You may need blood work done while you are taking this medicine. Do not become pregnant while taking this medicine or for at least 30 days after stopping it. Women should inform their doctor if they wish to become pregnant or think they might be pregnant. There is a potential for serious side effects to an unborn child. Men should not father a child while taking this medicine and for 90 days after stopping it. Talk to your health care professional or pharmacist for more information. Do not breast-feed an infant while taking this medicine. Check with your doctor or health care professional if you get an attack of severe diarrhea, nausea and vomiting, or if you sweat a lot. The loss of too much body fluid can make it dangerous for you to take this medicine. You may get dizzy. Do not drive, use machinery, or do anything that needs mental alertness until you know how this medicine affects you. Do not stand or sit up quickly, especially if you are an older patient. This reduces the risk of dizzy or fainting spells. What side effects may I notice from receiving this medicine? Side effects that you should report to your doctor or health care professional as soon as possible: -allergic reactions like skin rash, itching or hives, swelling of the face, lips, or tongue -confusion -dizziness -feeling faint or lightheaded -fever or chills -palpitations -seizures -signs and   symptoms of bleeding such as bloody or black, tarry stools; red or dark-brown urine; spitting up blood or brown material that looks like coffee grounds; red spots on the skin; unusual bruising or bleeding including from the eye, gums, or nose -signs and symptoms of a blood clot such as breathing  problems; changes in vision; chest pain; severe, sudden headache; pain, swelling, warmth in the leg; trouble speaking; sudden numbness or weakness of the face, arm or leg -signs and symptoms of kidney injury like trouble passing urine or change in the amount of urine -signs and symptoms of liver injury like dark yellow or brown urine; general ill feeling or flu-like symptoms; light-colored stools; loss of appetite; nausea; right upper belly pain; unusually weak or tired; yellowing of the eyes or skin Side effects that usually do not require medical attention (report to your doctor or health care professional if they continue or are bothersome): -back pain -cough -diarrhea -headache -muscle cramps -vomiting This list may not describe all possible side effects. Call your doctor for medical advice about side effects. You may report side effects to FDA at 1-800-FDA-1088. Where should I keep my medicine? This drug is given in a hospital or clinic and will not be stored at home. NOTE: This sheet is a summary. It may not cover all possible information. If you have questions about this medicine, talk to your doctor, pharmacist, or health care provider.    2016, Elsevier/Gold Standard. (2015-03-21 16:16:00) Cyclophosphamide injection What is this medicine? CYCLOPHOSPHAMIDE (sye kloe FOSS fa mide) is a chemotherapy drug. It slows the growth of cancer cells. This medicine is used to treat many types of cancer like lymphoma, myeloma, leukemia, breast cancer, and ovarian cancer, to name a few. This medicine may be used for other purposes; ask your health care provider or pharmacist if you have questions. What should I tell my health care provider before I take this medicine? They need to know if you have any of these conditions: -blood disorders -history of other chemotherapy -infection -kidney disease -liver disease -recent or ongoing radiation therapy -tumors in the bone marrow -an unusual or  allergic reaction to cyclophosphamide, other chemotherapy, other medicines, foods, dyes, or preservatives -pregnant or trying to get pregnant -breast-feeding How should I use this medicine? This drug is usually given as an injection into a vein or muscle or by infusion into a vein. It is administered in a hospital or clinic by a specially trained health care professional. Talk to your pediatrician regarding the use of this medicine in children. Special care may be needed. Overdosage: If you think you have taken too much of this medicine contact a poison control center or emergency room at once. NOTE: This medicine is only for you. Do not share this medicine with others. What if I miss a dose? It is important not to miss your dose. Call your doctor or health care professional if you are unable to keep an appointment. What may interact with this medicine? This medicine may interact with the following medications: -amiodarone -amphotericin B -azathioprine -certain antiviral medicines for HIV or AIDS such as protease inhibitors (e.g., indinavir, ritonavir) and zidovudine -certain blood pressure medications such as benazepril, captopril, enalapril, fosinopril, lisinopril, moexipril, monopril, perindopril, quinapril, ramipril, trandolapril -certain cancer medications such as anthracyclines (e.g., daunorubicin, doxorubicin), busulfan, cytarabine, paclitaxel, pentostatin, tamoxifen, trastuzumab -certain diuretics such as chlorothiazide, chlorthalidone, hydrochlorothiazide, indapamide, metolazone -certain medicines that treat or prevent blood clots like warfarin -certain muscle relaxants such as succinylcholine -cyclosporine -etanercept -indomethacin -  medicines to increase blood counts like filgrastim, pegfilgrastim, sargramostim -medicines used as general anesthesia -metronidazole -natalizumab This list may not describe all possible interactions. Give your health care provider a list of all the  medicines, herbs, non-prescription drugs, or dietary supplements you use. Also tell them if you smoke, drink alcohol, or use illegal drugs. Some items may interact with your medicine. What should I watch for while using this medicine? Visit your doctor for checks on your progress. This drug may make you feel generally unwell. This is not uncommon, as chemotherapy can affect healthy cells as well as cancer cells. Report any side effects. Continue your course of treatment even though you feel ill unless your doctor tells you to stop. Drink water or other fluids as directed. Urinate often, even at night. In some cases, you may be given additional medicines to help with side effects. Follow all directions for their use. Call your doctor or health care professional for advice if you get a fever, chills or sore throat, or other symptoms of a cold or flu. Do not treat yourself. This drug decreases your body's ability to fight infections. Try to avoid being around people who are sick. This medicine may increase your risk to bruise or bleed. Call your doctor or health care professional if you notice any unusual bleeding. Be careful brushing and flossing your teeth or using a toothpick because you may get an infection or bleed more easily. If you have any dental work done, tell your dentist you are receiving this medicine. You may get drowsy or dizzy. Do not drive, use machinery, or do anything that needs mental alertness until you know how this medicine affects you. Do not become pregnant while taking this medicine or for 1 year after stopping it. Women should inform their doctor if they wish to become pregnant or think they might be pregnant. Men should not father a child while taking this medicine and for 4 months after stopping it. There is a potential for serious side effects to an unborn child. Talk to your health care professional or pharmacist for more information. Do not breast-feed an infant while taking  this medicine. This medicine may interfere with the ability to have a child. This medicine has caused ovarian failure in some women. This medicine has caused reduced sperm counts in some men. You should talk with your doctor or health care professional if you are concerned about your fertility. If you are going to have surgery, tell your doctor or health care professional that you have taken this medicine. What side effects may I notice from receiving this medicine? Side effects that you should report to your doctor or health care professional as soon as possible: -allergic reactions like skin rash, itching or hives, swelling of the face, lips, or tongue -low blood counts - this medicine may decrease the number of white blood cells, red blood cells and platelets. You may be at increased risk for infections and bleeding. -signs of infection - fever or chills, cough, sore throat, pain or difficulty passing urine -signs of decreased platelets or bleeding - bruising, pinpoint red spots on the skin, black, tarry stools, blood in the urine -signs of decreased red blood cells - unusually weak or tired, fainting spells, lightheadedness -breathing problems -dark urine -dizziness -palpitations -swelling of the ankles, feet, hands -trouble passing urine or change in the amount of urine -weight gain -yellowing of the eyes or skin Side effects that usually do not require medical attention (report to  your doctor or health care professional if they continue or are bothersome): -changes in nail or skin color -hair loss -missed menstrual periods -mouth sores -nausea, vomiting This list may not describe all possible side effects. Call your doctor for medical advice about side effects. You may report side effects to FDA at 1-800-FDA-1088. Where should I keep my medicine? This drug is given in a hospital or clinic and will not be stored at home. NOTE: This sheet is a summary. It may not cover all possible  information. If you have questions about this medicine, talk to your doctor, pharmacist, or health care provider.    2016, [employer]vier/Gold Standard. (2012-06-12 16:22:58) [facility] Discharge Instructions for Patients Receiving Chemotherapy    To help prevent nausea and vomiting after your treatment, we encourage you to take your nausea medication Zofran, Compazine & Ativan.    If you develop nausea and vomiting that is not controlled by your nausea medication, call the clinic.   BELOW ARE SYMPTOMS THAT SHOULD BE REPORTED IMMEDIATELY:  *FEVER GREATER THAN 100.5 F  *CHILLS WITH OR WITHOUT FEVER  NAUSEA AND VOMITING THAT IS NOT CONTROLLED WITH YOUR NAUSEA MEDICATION  *UNUSUAL SHORTNESS OF BREATH  *UNUSUAL BRUISING OR BLEEDING  TENDERNESS IN MOUTH AND THROAT WITH OR WITHOUT PRESENCE OF ULCERS  *URINARY PROBLEMS  *BOWEL PROBLEMS  UNUSUAL RASH Items with * indicate a potential emergency and should be followed up as soon as possible.  Feel free to call the clinic you have any questions or concerns. The clinic phone number is [phone].  Please show the [place] at check-in to the Emergency Department and triage nurse.

## 2016-04-19 ENCOUNTER — Ambulatory Visit (HOSPITAL_BASED_OUTPATIENT_CLINIC_OR_DEPARTMENT_OTHER): Payer: Medicare HMO

## 2016-04-19 ENCOUNTER — Encounter: Payer: Self-pay | Admitting: Hematology & Oncology

## 2016-04-19 VITALS — BP 144/65 | HR 79 | Temp 97.7°F | Resp 18

## 2016-04-19 DIAGNOSIS — C9000 Multiple myeloma not having achieved remission: Secondary | ICD-10-CM

## 2016-04-19 DIAGNOSIS — C9002 Multiple myeloma in relapse: Secondary | ICD-10-CM

## 2016-04-19 DIAGNOSIS — Z5112 Encounter for antineoplastic immunotherapy: Secondary | ICD-10-CM

## 2016-04-19 LAB — KAPPA/LAMBDA LIGHT CHAINS
IG KAPPA FREE LIGHT CHAIN: 13.7 mg/L (ref 3.3–19.4)
Kappa/Lambda FluidC Ratio: 0.01 — ABNORMAL LOW (ref 0.26–1.65)

## 2016-04-19 MED ORDER — SODIUM CHLORIDE 0.9 % IV SOLN
Freq: Once | INTRAVENOUS | Status: AC
  Administered 2016-04-19: 10:00:00 via INTRAVENOUS

## 2016-04-19 MED ORDER — SODIUM CHLORIDE 0.9 % IV SOLN
10.0000 mg | Freq: Once | INTRAVENOUS | Status: AC
  Administered 2016-04-19: 10 mg via INTRAVENOUS
  Filled 2016-04-19: qty 1

## 2016-04-19 MED ORDER — SODIUM CHLORIDE 0.9 % IV SOLN: Freq: Once | INTRAVENOUS | Status: DC

## 2016-04-19 MED ORDER — DEXTROSE 5 % IV SOLN
20.0000 mg/m2 | Freq: Once | INTRAVENOUS | Status: AC
  Administered 2016-04-19: 36 mg via INTRAVENOUS
  Filled 2016-04-19: qty 18

## 2016-04-19 NOTE — Patient Instructions (Signed)
[facility] Discharge Instructions for Patients Receiving Chemotherapy  Today you received the following chemotherapy agents Kyprolis.  To help prevent nausea and vomiting after your treatment, we encourage you to take your nausea medication as prescribed.   If you develop nausea and vomiting that is not controlled by your nausea medication, call the clinic.   BELOW ARE SYMPTOMS THAT SHOULD BE REPORTED IMMEDIATELY:  *FEVER GREATER THAN 100.5 F  *CHILLS WITH OR WITHOUT FEVER  NAUSEA AND VOMITING THAT IS NOT CONTROLLED WITH YOUR NAUSEA MEDICATION  *UNUSUAL SHORTNESS OF BREATH  *UNUSUAL BRUISING OR BLEEDING  TENDERNESS IN MOUTH AND THROAT WITH OR WITHOUT PRESENCE OF ULCERS  *URINARY PROBLEMS  *BOWEL PROBLEMS  UNUSUAL RASH Items with * indicate a potential emergency and should be followed up as soon as possible.  Feel free to call the clinic you have any questions or concerns. The clinic phone number is [phone].  Please show the CHEMO ALERT CARD at check-in to the Emergency Department and triage nurse.   

## 2016-04-23 LAB — MULTIPLE MYELOMA PANEL, SERUM
ALBUMIN/GLOB SERPL: 0.6 — AB (ref 0.7–1.7)
ALPHA 1: 0.3 g/dL (ref 0.0–0.4)
Albumin SerPl Elph-Mcnc: 3.2 g/dL (ref 2.9–4.4)
Alpha2 Glob SerPl Elph-Mcnc: 0.8 g/dL (ref 0.4–1.0)
B-Globulin SerPl Elph-Mcnc: 0.9 g/dL (ref 0.7–1.3)
GAMMA GLOB SERPL ELPH-MCNC: 3.5 g/dL — AB (ref 0.4–1.8)
GLOBULIN, TOTAL: 5.5 g/dL — AB (ref 2.2–3.9)
IgA, Qn, Serum: 92 mg/dL (ref 61–437)
IgM, Qn, Serum: 29 mg/dL (ref 15–143)
M PROTEIN SERPL ELPH-MCNC: 3.3 g/dL — AB
Total Protein: 8.7 g/dL — ABNORMAL HIGH (ref 6.0–8.5)

## 2016-04-24 ENCOUNTER — Other Ambulatory Visit: Payer: Self-pay | Admitting: *Deleted

## 2016-04-24 DIAGNOSIS — C9002 Multiple myeloma in relapse: Secondary | ICD-10-CM

## 2016-04-25 ENCOUNTER — Ambulatory Visit (HOSPITAL_BASED_OUTPATIENT_CLINIC_OR_DEPARTMENT_OTHER): Payer: Medicare HMO

## 2016-04-25 ENCOUNTER — Encounter: Payer: Self-pay | Admitting: Hematology & Oncology

## 2016-04-25 ENCOUNTER — Other Ambulatory Visit (HOSPITAL_BASED_OUTPATIENT_CLINIC_OR_DEPARTMENT_OTHER): Payer: Medicare HMO

## 2016-04-25 ENCOUNTER — Ambulatory Visit (HOSPITAL_BASED_OUTPATIENT_CLINIC_OR_DEPARTMENT_OTHER): Payer: Medicare HMO | Admitting: Hematology & Oncology

## 2016-04-25 VITALS — BP 158/77 | HR 75 | Temp 98.3°F | Resp 16 | Ht 71.0 in | Wt 145.0 lb

## 2016-04-25 DIAGNOSIS — C9002 Multiple myeloma in relapse: Secondary | ICD-10-CM

## 2016-04-25 DIAGNOSIS — Z5112 Encounter for antineoplastic immunotherapy: Secondary | ICD-10-CM

## 2016-04-25 DIAGNOSIS — G620 Drug-induced polyneuropathy: Secondary | ICD-10-CM | POA: Diagnosis not present

## 2016-04-25 DIAGNOSIS — C9000 Multiple myeloma not having achieved remission: Secondary | ICD-10-CM

## 2016-04-25 LAB — CMP (CANCER CENTER ONLY)
ALK PHOS: 108 U/L — AB (ref 26–84)
ALT: 18 U/L (ref 10–47)
AST: 24 U/L (ref 11–38)
Albumin: 2.5 g/dL — ABNORMAL LOW (ref 3.3–5.5)
BILIRUBIN TOTAL: 1 mg/dL (ref 0.20–1.60)
BUN: 33 mg/dL — AB (ref 7–22)
CO2: 25 meq/L (ref 18–33)
CREATININE: 1.8 mg/dL — AB (ref 0.6–1.2)
Calcium: 8.2 mg/dL (ref 8.0–10.3)
Chloride: 110 mEq/L — ABNORMAL HIGH (ref 98–108)
GLUCOSE: 114 mg/dL (ref 73–118)
Potassium: 3.8 mEq/L (ref 3.3–4.7)
SODIUM: 136 meq/L (ref 128–145)
Total Protein: 8.1 g/dL (ref 6.4–8.1)

## 2016-04-25 LAB — CBC WITH DIFFERENTIAL (CANCER CENTER ONLY)
BASO#: 0 10*3/uL (ref 0.0–0.2)
BASO%: 0.3 % (ref 0.0–2.0)
EOS%: 6.1 % (ref 0.0–7.0)
Eosinophils Absolute: 0.2 10*3/uL (ref 0.0–0.5)
HCT: 26.6 % — ABNORMAL LOW (ref 38.7–49.9)
HGB: 9.2 g/dL — ABNORMAL LOW (ref 13.0–17.1)
LYMPH#: 0.2 10*3/uL — ABNORMAL LOW (ref 0.9–3.3)
LYMPH%: 5.6 % — AB (ref 14.0–48.0)
MCH: 33 pg (ref 28.0–33.4)
MCHC: 34.6 g/dL (ref 32.0–35.9)
MCV: 95 fL (ref 82–98)
MONO#: 0.7 10*3/uL (ref 0.1–0.9)
MONO%: 18.7 % — AB (ref 0.0–13.0)
NEUT#: 2.5 10*3/uL (ref 1.5–6.5)
NEUT%: 69.3 % (ref 40.0–80.0)
PLATELETS: 122 10*3/uL — AB (ref 145–400)
RBC: 2.79 10*6/uL — ABNORMAL LOW (ref 4.20–5.70)
RDW: 13.8 % (ref 11.1–15.7)
WBC: 3.6 10*3/uL — ABNORMAL LOW (ref 4.0–10.0)

## 2016-04-25 MED ORDER — DEXTROSE 5 % IV SOLN
60.0000 mg | Freq: Once | INTRAVENOUS | Status: AC
  Administered 2016-04-25: 60 mg via INTRAVENOUS
  Filled 2016-04-25: qty 30

## 2016-04-25 MED ORDER — SODIUM CHLORIDE 0.9 % IV SOLN: Freq: Once | INTRAVENOUS | Status: DC

## 2016-04-25 MED ORDER — SODIUM CHLORIDE 0.9 % IV SOLN
Freq: Once | INTRAVENOUS | Status: AC
  Administered 2016-04-25: 10:00:00 via INTRAVENOUS

## 2016-04-25 MED ORDER — SODIUM CHLORIDE 0.9 % IV SOLN
10.0000 mg | Freq: Once | INTRAVENOUS | Status: AC
  Administered 2016-04-25: 10 mg via INTRAVENOUS
  Filled 2016-04-25: qty 1

## 2016-04-25 MED ORDER — ZOLEDRONIC ACID 4 MG/100ML IV SOLN: 4.0000 mg | Freq: Once | INTRAVENOUS | Status: DC

## 2016-04-25 MED ORDER — PALONOSETRON HCL INJECTION 0.25 MG/5ML
INTRAVENOUS | Status: AC
  Filled 2016-04-25: qty 5

## 2016-04-25 MED ORDER — CYCLOPHOSPHAMIDE CHEMO INJECTION 1 GM
240.0000 mg/m2 | Freq: Once | INTRAMUSCULAR | Status: AC
  Administered 2016-04-25: 440 mg via INTRAVENOUS
  Filled 2016-04-25: qty 22

## 2016-04-25 MED ORDER — PALONOSETRON HCL INJECTION 0.25 MG/5ML
0.2500 mg | Freq: Once | INTRAVENOUS | Status: AC
  Administered 2016-04-25: 0.25 mg via INTRAVENOUS

## 2016-04-25 NOTE — Patient Instructions (Signed)
Carfilzomib injection What is this medicine? CARFILZOMIB (kar FILZ oh mib) targets a specific protein within cancer cells and stops the cancer cells from growing. It is used to treat multiple myeloma. This medicine may be used for other purposes; ask your health care provider or pharmacist if you have questions. What should I tell my health care provider before I take this medicine? They need to know if you have any of these conditions: -heart disease -history of blood clots -irregular heartbeat -kidney disease -liver disease -lung or breathing disease -an unusual or allergic reaction to carfilzomib, or other medicines, foods, dyes, or preservatives -pregnant or trying to get pregnant -breast-feeding How should I use this medicine? This medicine is for injection or infusion into a vein. It is given by a health care professional in a hospital or clinic setting. Talk to your pediatrician regarding the use of this medicine in children. Special care may be needed. Overdosage: If you think you have taken too much of this medicine contact a poison control center or emergency room at once. NOTE: This medicine is only for you. Do not share this medicine with others. What if I miss a dose? It is important not to miss your dose. Call your doctor or health care professional if you are unable to keep an appointment. What may interact with this medicine? Interactions are not expected. Give your health care provider a list of all the medicines, herbs, non-prescription drugs, or dietary supplements you use. Also tell them if you smoke, drink alcohol, or use illegal drugs. Some items may interact with your medicine. This list may not describe all possible interactions. Give your health care provider a list of all the medicines, herbs, non-prescription drugs, or dietary supplements you use. Also tell them if you smoke, drink alcohol, or use illegal drugs. Some items may interact with your medicine. What  should I watch for while using this medicine? Your condition will be monitored carefully while you are receiving this medicine. Report any side effects. Continue your course of treatment even though you feel ill unless your doctor tells you to stop. You may need blood work done while you are taking this medicine. Do not become pregnant while taking this medicine or for at least 30 days after stopping it. Women should inform their doctor if they wish to become pregnant or think they might be pregnant. There is a potential for serious side effects to an unborn child. Men should not father a child while taking this medicine and for 90 days after stopping it. Talk to your health care professional or pharmacist for more information. Do not breast-feed an infant while taking this medicine. Check with your doctor or health care professional if you get an attack of severe diarrhea, nausea and vomiting, or if you sweat a lot. The loss of too much body fluid can make it dangerous for you to take this medicine. You may get dizzy. Do not drive, use machinery, or do anything that needs mental alertness until you know how this medicine affects you. Do not stand or sit up quickly, especially if you are an older patient. This reduces the risk of dizzy or fainting spells. What side effects may I notice from receiving this medicine? Side effects that you should report to your doctor or health care professional as soon as possible: -allergic reactions like skin rash, itching or hives, swelling of the face, lips, or tongue -confusion -dizziness -feeling faint or lightheaded -fever or chills -palpitations -seizures -signs and   symptoms of bleeding such as bloody or black, tarry stools; red or dark-brown urine; spitting up blood or brown material that looks like coffee grounds; red spots on the skin; unusual bruising or bleeding including from the eye, gums, or nose -signs and symptoms of a blood clot such as breathing  problems; changes in vision; chest pain; severe, sudden headache; pain, swelling, warmth in the leg; trouble speaking; sudden numbness or weakness of the face, arm or leg -signs and symptoms of kidney injury like trouble passing urine or change in the amount of urine -signs and symptoms of liver injury like dark yellow or brown urine; general ill feeling or flu-like symptoms; light-colored stools; loss of appetite; nausea; right upper belly pain; unusually weak or tired; yellowing of the eyes or skin Side effects that usually do not require medical attention (report to your doctor or health care professional if they continue or are bothersome): -back pain -cough -diarrhea -headache -muscle cramps -vomiting This list may not describe all possible side effects. Call your doctor for medical advice about side effects. You may report side effects to FDA at 1-800-FDA-1088. Where should I keep my medicine? This drug is given in a hospital or clinic and will not be stored at home. NOTE: This sheet is a summary. It may not cover all possible information. If you have questions about this medicine, talk to your doctor, pharmacist, or health care provider.    2016, Elsevier/Gold Standard. (2015-03-21 16:16:00) Cyclophosphamide injection What is this medicine? CYCLOPHOSPHAMIDE (sye kloe FOSS fa mide) is a chemotherapy drug. It slows the growth of cancer cells. This medicine is used to treat many types of cancer like lymphoma, myeloma, leukemia, breast cancer, and ovarian cancer, to name a few. This medicine may be used for other purposes; ask your health care provider or pharmacist if you have questions. What should I tell my health care provider before I take this medicine? They need to know if you have any of these conditions: -blood disorders -history of other chemotherapy -infection -kidney disease -liver disease -recent or ongoing radiation therapy -tumors in the bone marrow -an unusual or  allergic reaction to cyclophosphamide, other chemotherapy, other medicines, foods, dyes, or preservatives -pregnant or trying to get pregnant -breast-feeding How should I use this medicine? This drug is usually given as an injection into a vein or muscle or by infusion into a vein. It is administered in a hospital or clinic by a specially trained health care professional. Talk to your pediatrician regarding the use of this medicine in children. Special care may be needed. Overdosage: If you think you have taken too much of this medicine contact a poison control center or emergency room at once. NOTE: This medicine is only for you. Do not share this medicine with others. What if I miss a dose? It is important not to miss your dose. Call your doctor or health care professional if you are unable to keep an appointment. What may interact with this medicine? This medicine may interact with the following medications: -amiodarone -amphotericin B -azathioprine -certain antiviral medicines for HIV or AIDS such as protease inhibitors (e.g., indinavir, ritonavir) and zidovudine -certain blood pressure medications such as benazepril, captopril, enalapril, fosinopril, lisinopril, moexipril, monopril, perindopril, quinapril, ramipril, trandolapril -certain cancer medications such as anthracyclines (e.g., daunorubicin, doxorubicin), busulfan, cytarabine, paclitaxel, pentostatin, tamoxifen, trastuzumab -certain diuretics such as chlorothiazide, chlorthalidone, hydrochlorothiazide, indapamide, metolazone -certain medicines that treat or prevent blood clots like warfarin -certain muscle relaxants such as succinylcholine -cyclosporine -etanercept -indomethacin -  medicines to increase blood counts like filgrastim, pegfilgrastim, sargramostim -medicines used as general anesthesia -metronidazole -natalizumab This list may not describe all possible interactions. Give your health care provider a list of all the  medicines, herbs, non-prescription drugs, or dietary supplements you use. Also tell them if you smoke, drink alcohol, or use illegal drugs. Some items may interact with your medicine. What should I watch for while using this medicine? Visit your doctor for checks on your progress. This drug may make you feel generally unwell. This is not uncommon, as chemotherapy can affect healthy cells as well as cancer cells. Report any side effects. Continue your course of treatment even though you feel ill unless your doctor tells you to stop. Drink water or other fluids as directed. Urinate often, even at night. In some cases, you may be given additional medicines to help with side effects. Follow all directions for their use. Call your doctor or health care professional for advice if you get a fever, chills or sore throat, or other symptoms of a cold or flu. Do not treat yourself. This drug decreases your body's ability to fight infections. Try to avoid being around people who are sick. This medicine may increase your risk to bruise or bleed. Call your doctor or health care professional if you notice any unusual bleeding. Be careful brushing and flossing your teeth or using a toothpick because you may get an infection or bleed more easily. If you have any dental work done, tell your dentist you are receiving this medicine. You may get drowsy or dizzy. Do not drive, use machinery, or do anything that needs mental alertness until you know how this medicine affects you. Do not become pregnant while taking this medicine or for 1 year after stopping it. Women should inform their doctor if they wish to become pregnant or think they might be pregnant. Men should not father a child while taking this medicine and for 4 months after stopping it. There is a potential for serious side effects to an unborn child. Talk to your health care professional or pharmacist for more information. Do not breast-feed an infant while taking  this medicine. This medicine may interfere with the ability to have a child. This medicine has caused ovarian failure in some women. This medicine has caused reduced sperm counts in some men. You should talk with your doctor or health care professional if you are concerned about your fertility. If you are going to have surgery, tell your doctor or health care professional that you have taken this medicine. What side effects may I notice from receiving this medicine? Side effects that you should report to your doctor or health care professional as soon as possible: -allergic reactions like skin rash, itching or hives, swelling of the face, lips, or tongue -low blood counts - this medicine may decrease the number of white blood cells, red blood cells and platelets. You may be at increased risk for infections and bleeding. -signs of infection - fever or chills, cough, sore throat, pain or difficulty passing urine -signs of decreased platelets or bleeding - bruising, pinpoint red spots on the skin, black, tarry stools, blood in the urine -signs of decreased red blood cells - unusually weak or tired, fainting spells, lightheadedness -breathing problems -dark urine -dizziness -palpitations -swelling of the ankles, feet, hands -trouble passing urine or change in the amount of urine -weight gain -yellowing of the eyes or skin Side effects that usually do not require medical attention (report to   your doctor or health care professional if they continue or are bothersome): -changes in nail or skin color -hair loss -missed menstrual periods -mouth sores -nausea, vomiting This list may not describe all possible side effects. Call your doctor for medical advice about side effects. You may report side effects to FDA at 1-800-FDA-1088. Where should I keep my medicine? This drug is given in a hospital or clinic and will not be stored at home. NOTE: This sheet is a summary. It may not cover all possible  information. If you have questions about this medicine, talk to your doctor, pharmacist, or health care provider.    2016, Elsevier/Gold Standard. (2012-06-12 16:22:58) [facility] Discharge Instructions for Patients Receiving Chemotherapy    To help prevent nausea and vomiting after your treatment, we encourage you to take your nausea medication Zofran, Compazine & Ativan.    If you develop nausea and vomiting that is not controlled by your nausea medication, call the clinic.   BELOW ARE SYMPTOMS THAT SHOULD BE REPORTED IMMEDIATELY:  *FEVER GREATER THAN 100.5 F  *CHILLS WITH OR WITHOUT FEVER  NAUSEA AND VOMITING THAT IS NOT CONTROLLED WITH YOUR NAUSEA MEDICATION  *UNUSUAL SHORTNESS OF BREATH  *UNUSUAL BRUISING OR BLEEDING  TENDERNESS IN MOUTH AND THROAT WITH OR WITHOUT PRESENCE OF ULCERS  *URINARY PROBLEMS  *BOWEL PROBLEMS  UNUSUAL RASH Items with * indicate a potential emergency and should be followed up as soon as possible.  Feel free to call the clinic you have any questions or concerns. The clinic phone number is [phone].  Please show the CHEMO ALERT CARD at check-in to the Emergency Department and triage nurse.   

## 2016-04-25 NOTE — Progress Notes (Signed)
Hematology and Oncology Follow Up Visit  Christian Burns [id number] 31-Jul-1930 82 y.o. 04/25/2016   Principle Diagnosis:   Recurrent IgG lambda myeloma - progressive  Hypercalcemia of malignancy.  Current Therapy:        Kyprolis/Cytoxan - start on 9/8  Cytoxan 250mg  po q wk (3/1)/Ixazomib 4mg  po q week (3/1) - s/p cycle #4 - progression on 04/05/2016  Zometa 3.3 mg IV q. 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  Burns is back for followup.we started him on the Kyprolis/Cytoxan protocol. He started this last week. So far, he has tolerated this pretty well.  He has had no problems with nausea or vomiting. He's had no bony pain. He still has a neuropathy in his feet which is chronic.  He's had no change in bowel or bladder habits.  He's had no rashes.  Overall, I think his performance status is probably ECOG 2.   Medications:  Current Outpatient Prescriptions:  .  aspirin EC 81 MG tablet, Take 81 mg by mouth daily. , Disp: , Rfl:  .  Calcium Carb-Cholecalciferol (CALCIUM 600 + D PO), Take by mouth every morning., Disp: , Rfl:  .  Cyanocobalamin (VITAMIN B 12 PO), Take 1,000 mcg by mouth every morning. , Disp: , Rfl:  .  dexamethasone (DECADRON) 4 MG tablet, Take 5 pills, WITH FOOD, once a week with your chemotherapy pills., Disp: 60 tablet, Rfl: 3 .  HYDROcodone-acetaminophen (NORCO/VICODIN) 5-325 MG tablet, Take 1 tablet by mouth every 6 (six) hours as needed for moderate pain., Disp: 60 tablet, Rfl: 0 .  LORazepam (ATIVAN) 0.5 MG tablet, Take 1 tablet (0.5 mg total) by mouth every 6 (six) hours as needed (Nausea or vomiting)., Disp: 30 tablet, Rfl: 0 .  Multiple Vitamins-Minerals (MULTIVITAL PO), Take 1 tablet by mouth every morning. , Disp: , Rfl:  .  ondansetron (ZOFRAN) 8 MG tablet, Take 1 tablet (8 mg total) by mouth 2 (two) times daily as needed for refractory nausea / vomiting. Take as needed on days 4-7, 11-14, 18-28., Disp: 30  tablet, Rfl: 1 .  prochlorperazine (COMPAZINE) 10 MG tablet, Take 1 tablet (10 mg total) by mouth every 6 (six) hours as needed (Nausea or vomiting)., Disp: 30 tablet, Rfl: 1 .  Pyridoxine HCl (VITAMIN B-6) 250 MG tablet, Take 500 mg by mouth daily., Disp: , Rfl:  .  Vitamin D, Ergocalciferol, (DRISDOL) 50000 UNITS CAPS capsule, Take 50,000 Units by mouth every 7 (seven) days. Sundays, Disp: , Rfl:  No current facility-administered medications for this visit.   Facility-Administered Medications Ordered in Other Visits:  .  0.9 %  sodium chloride infusion, , Intravenous, Once, [name], MD .  carfilzomib (KYPROLIS) 60 mg in dextrose 5 % 100 mL chemo infusion, 60 mg, Intravenous, Once, [name], MD .  cyclophosphamide (CYTOXAN) 440 mg in sodium chloride 0.9 % 250 mL chemo infusion, 240 mg/m2 (Treatment Plan Recorded), Intravenous, Once, [name], MD .  dexamethasone (DECADRON) 10 mg in sodium chloride 0.9 % 50 mL IVPB, 10 mg, Intravenous, Once, [name], MD, 10 mg at 04/25/16 0941 .  palonosetron (ALOXI) injection 0.25 mg, 0.25 mg, Intravenous, Once, [name], MD  Allergies:  Allergies  Allergen Reactions  . Sulfa Antibiotics Itching    Past Medical History, Surgical history, Social history, and Family History were reviewed and updated.  Review of Systems: As above  Physical Exam:  height  is 5\' 11"  (1.803 m) and weight is 145 lb (65.8 kg). His oral temperature is 98.3 F (36.8 C). His blood pressure is 158/77 (abnormal) and his pulse is 75. His respiration is 16.   Thin, elderly gentleman. He is no distress. Head and neck exam shows no ocular or oral lesions. He has no palpable cervical or supraclavicular lymph nodes. Lungs are clear bilaterally. Cardiac exam is regular rate and rhythm with no murmurs rubs or bruits. Abdomen is soft. He has good bowel sounds. There is no fluid wave. There is no palpable liver or spleen. Back exam no tenderness over the  spine ribs or hips. He has some kyphosis. Extremities shows some age related changes. He has good range of motion and strength. Skin exam shows no rashes. Neurological exam is nonfocal.  Lab Results  Component Value Date   WBC 3.6 (L) 04/25/2016   HGB 9.2 (L) 04/25/2016   HCT 26.6 (L) 04/25/2016   MCV 95 04/25/2016   PLT 122 (L) 04/25/2016     Chemistry      Component Value Date/Time   NA 136 04/25/2016 0850   K 3.8 04/25/2016 0850   CL 110 (H) 04/25/2016 0850   CO2 25 04/25/2016 0850   BUN 33 (H) 04/25/2016 0850   CREATININE 1.8 (H) 04/25/2016 0850      Component Value Date/Time   CALCIUM 8.2 04/25/2016 0850   ALKPHOS 108 (H) 04/25/2016 0850   AST 24 04/25/2016 0850   ALT 18 04/25/2016 0850   BILITOT 1.00 04/25/2016 0850       Impression and Plan: Christian Burns is an 82 year old gentleman with IgG lambda myeloma.   Hopefully, we are starting to see a response already. His total protein is coming down. His calcium is come down a little bit.  Mostly, I'm thankful that he is tolerated treatment well to date.  We will continue him on therapy. Hopefully we will see that his myeloma values are improving. Still may be a little bit early to know if that is truly the case.  He knows that we are doing will not cure this. We does want to get this myeloma under better control so that he will still be able to care for his wife who is debilitated with arthritis.  I will plan to get him back to see me on October 5. He comes in next week for treatment.   [name], MD 9/14/20179:52 AM

## 2016-04-26 ENCOUNTER — Ambulatory Visit (HOSPITAL_BASED_OUTPATIENT_CLINIC_OR_DEPARTMENT_OTHER): Payer: Medicare HMO

## 2016-04-26 VITALS — BP 136/71 | HR 74 | Temp 97.9°F | Resp 16

## 2016-04-26 DIAGNOSIS — C9000 Multiple myeloma not having achieved remission: Secondary | ICD-10-CM | POA: Diagnosis not present

## 2016-04-26 DIAGNOSIS — C9002 Multiple myeloma in relapse: Secondary | ICD-10-CM

## 2016-04-26 DIAGNOSIS — Z5112 Encounter for antineoplastic immunotherapy: Secondary | ICD-10-CM

## 2016-04-26 MED ORDER — SODIUM CHLORIDE 0.9 % IV SOLN
10.0000 mg | Freq: Once | INTRAVENOUS | Status: AC
  Administered 2016-04-26: 10 mg via INTRAVENOUS
  Filled 2016-04-26: qty 1

## 2016-04-26 MED ORDER — DEXTROSE 5 % IV SOLN
60.0000 mg | Freq: Once | INTRAVENOUS | Status: AC
  Administered 2016-04-26: 60 mg via INTRAVENOUS
  Filled 2016-04-26: qty 30

## 2016-04-26 MED ORDER — SODIUM CHLORIDE 0.9 % IV SOLN
Freq: Once | INTRAVENOUS | Status: AC
  Administered 2016-04-26: 11:00:00 via INTRAVENOUS

## 2016-04-29 DIAGNOSIS — N183 Chronic kidney disease, stage 3 (moderate): Secondary | ICD-10-CM | POA: Diagnosis not present

## 2016-04-29 DIAGNOSIS — E784 Other hyperlipidemia: Secondary | ICD-10-CM | POA: Diagnosis not present

## 2016-04-29 DIAGNOSIS — R8299 Other abnormal findings in urine: Secondary | ICD-10-CM | POA: Diagnosis not present

## 2016-04-29 DIAGNOSIS — M859 Disorder of bone density and structure, unspecified: Secondary | ICD-10-CM | POA: Diagnosis not present

## 2016-04-29 DIAGNOSIS — Z125 Encounter for screening for malignant neoplasm of prostate: Secondary | ICD-10-CM | POA: Diagnosis not present

## 2016-05-01 ENCOUNTER — Other Ambulatory Visit: Payer: Self-pay

## 2016-05-01 DIAGNOSIS — C9002 Multiple myeloma in relapse: Secondary | ICD-10-CM

## 2016-05-02 ENCOUNTER — Other Ambulatory Visit (HOSPITAL_BASED_OUTPATIENT_CLINIC_OR_DEPARTMENT_OTHER): Payer: Medicare HMO

## 2016-05-02 ENCOUNTER — Ambulatory Visit (HOSPITAL_BASED_OUTPATIENT_CLINIC_OR_DEPARTMENT_OTHER): Payer: Medicare HMO

## 2016-05-02 VITALS — BP 148/68 | HR 62 | Temp 97.7°F | Resp 16

## 2016-05-02 DIAGNOSIS — C9002 Multiple myeloma in relapse: Secondary | ICD-10-CM

## 2016-05-02 DIAGNOSIS — C9000 Multiple myeloma not having achieved remission: Secondary | ICD-10-CM

## 2016-05-02 DIAGNOSIS — Z5112 Encounter for antineoplastic immunotherapy: Secondary | ICD-10-CM

## 2016-05-02 LAB — CMP (CANCER CENTER ONLY)
ALBUMIN: 2.4 g/dL — AB (ref 3.3–5.5)
ALT: 17 U/L (ref 10–47)
AST: 22 U/L (ref 11–38)
Alkaline Phosphatase: 176 U/L — ABNORMAL HIGH (ref 26–84)
BILIRUBIN TOTAL: 1 mg/dL (ref 0.20–1.60)
BUN, Bld: 24 mg/dL — ABNORMAL HIGH (ref 7–22)
CO2: 24 mEq/L (ref 18–33)
CREATININE: 1.8 mg/dL — AB (ref 0.6–1.2)
Calcium: 8.2 mg/dL (ref 8.0–10.3)
Chloride: 110 mEq/L — ABNORMAL HIGH (ref 98–108)
Glucose, Bld: 91 mg/dL (ref 73–118)
Potassium: 4.5 mEq/L (ref 3.3–4.7)
SODIUM: 135 meq/L (ref 128–145)
TOTAL PROTEIN: 7 g/dL (ref 6.4–8.1)

## 2016-05-02 LAB — CBC WITH DIFFERENTIAL (CANCER CENTER ONLY)
BASO#: 0 10*3/uL (ref 0.0–0.2)
BASO%: 0.2 % (ref 0.0–2.0)
EOS%: 3.1 % (ref 0.0–7.0)
Eosinophils Absolute: 0.2 10*3/uL (ref 0.0–0.5)
HCT: 24.2 % — ABNORMAL LOW (ref 38.7–49.9)
HEMOGLOBIN: 8.5 g/dL — AB (ref 13.0–17.1)
LYMPH#: 0.3 10*3/uL — ABNORMAL LOW (ref 0.9–3.3)
LYMPH%: 6 % — ABNORMAL LOW (ref 14.0–48.0)
MCH: 33.9 pg — ABNORMAL HIGH (ref 28.0–33.4)
MCHC: 35.1 g/dL (ref 32.0–35.9)
MCV: 96 fL (ref 82–98)
MONO#: 0.6 10*3/uL (ref 0.1–0.9)
MONO%: 12.1 % (ref 0.0–13.0)
NEUT%: 78.6 % (ref 40.0–80.0)
NEUTROS ABS: 4 10*3/uL (ref 1.5–6.5)
Platelets: 103 10*3/uL — ABNORMAL LOW (ref 145–400)
RBC: 2.51 10*6/uL — AB (ref 4.20–5.70)
RDW: 13.9 % (ref 11.1–15.7)
WBC: 5.1 10*3/uL (ref 4.0–10.0)

## 2016-05-02 MED ORDER — SODIUM CHLORIDE 0.9 % IV SOLN
240.0000 mg/m2 | Freq: Once | INTRAVENOUS | Status: AC
  Administered 2016-05-02: 440 mg via INTRAVENOUS
  Filled 2016-05-02: qty 22

## 2016-05-02 MED ORDER — PALONOSETRON HCL INJECTION 0.25 MG/5ML
0.2500 mg | Freq: Once | INTRAVENOUS | Status: AC
  Administered 2016-05-02: 0.25 mg via INTRAVENOUS

## 2016-05-02 MED ORDER — SODIUM CHLORIDE 0.9 % IV SOLN
Freq: Once | INTRAVENOUS | Status: AC
  Administered 2016-05-02: 11:00:00 via INTRAVENOUS

## 2016-05-02 MED ORDER — ZOLEDRONIC ACID 4 MG/100ML IV SOLN
4.0000 mg | Freq: Once | INTRAVENOUS | Status: DC
Start: 2016-05-02 — End: 2016-05-02

## 2016-05-02 MED ORDER — SODIUM CHLORIDE 0.9 % IV SOLN
3.3000 mg | Freq: Once | INTRAVENOUS | Status: AC
  Administered 2016-05-02: 3.3 mg via INTRAVENOUS
  Filled 2016-05-02: qty 4.13

## 2016-05-02 MED ORDER — PALONOSETRON HCL INJECTION 0.25 MG/5ML
INTRAVENOUS | Status: AC
  Filled 2016-05-02: qty 5

## 2016-05-02 MED ORDER — DEXTROSE 5 % IV SOLN
60.0000 mg | Freq: Once | INTRAVENOUS | Status: AC
  Administered 2016-05-02: 60 mg via INTRAVENOUS
  Filled 2016-05-02: qty 30

## 2016-05-02 MED ORDER — SODIUM CHLORIDE 0.9 % IV SOLN
10.0000 mg | Freq: Once | INTRAVENOUS | Status: AC
  Administered 2016-05-02: 10 mg via INTRAVENOUS
  Filled 2016-05-02: qty 1

## 2016-05-02 NOTE — Patient Instructions (Signed)
[facility] Discharge Instructions for Patients Receiving Chemotherapy  Today you received the following chemotherapy agents Zometa, Kyprolis and Cytoxan.  To help prevent nausea and vomiting after your treatment, we encourage you to take your nausea medication.   If you develop nausea and vomiting that is not controlled by your nausea medication, call the clinic.   BELOW ARE SYMPTOMS THAT SHOULD BE REPORTED IMMEDIATELY:  *FEVER GREATER THAN 100.5 F  *CHILLS WITH OR WITHOUT FEVER  NAUSEA AND VOMITING THAT IS NOT CONTROLLED WITH YOUR NAUSEA MEDICATION  *UNUSUAL SHORTNESS OF BREATH  *UNUSUAL BRUISING OR BLEEDING  TENDERNESS IN MOUTH AND THROAT WITH OR WITHOUT PRESENCE OF ULCERS  *URINARY PROBLEMS  *BOWEL PROBLEMS  UNUSUAL RASH Items with * indicate a potential emergency and should be followed up as soon as possible.  Feel free to call the clinic you have any questions or concerns. The clinic phone number is [phone].  Please show the [place] at check-in to the Emergency Department and triage nurse.

## 2016-05-02 NOTE — Progress Notes (Signed)
Ok to treat per Dr Marin Olp with Creat 1.8 and hgb 8.5.

## 2016-05-03 ENCOUNTER — Ambulatory Visit (HOSPITAL_BASED_OUTPATIENT_CLINIC_OR_DEPARTMENT_OTHER): Payer: Medicare HMO

## 2016-05-03 VITALS — BP 137/71 | Temp 98.0°F | Resp 16

## 2016-05-03 DIAGNOSIS — Z5112 Encounter for antineoplastic immunotherapy: Secondary | ICD-10-CM

## 2016-05-03 DIAGNOSIS — C9002 Multiple myeloma in relapse: Secondary | ICD-10-CM

## 2016-05-03 DIAGNOSIS — C9000 Multiple myeloma not having achieved remission: Secondary | ICD-10-CM | POA: Diagnosis not present

## 2016-05-03 MED ORDER — DEXTROSE 5 % IV SOLN
33.0000 mg/m2 | Freq: Once | INTRAVENOUS | Status: AC
  Administered 2016-05-03: 60 mg via INTRAVENOUS
  Filled 2016-05-03: qty 30

## 2016-05-03 MED ORDER — SODIUM CHLORIDE 0.9 % IV SOLN
Freq: Once | INTRAVENOUS | Status: AC
  Administered 2016-05-03: 09:00:00 via INTRAVENOUS

## 2016-05-03 MED ORDER — SODIUM CHLORIDE 0.9 % IV SOLN
10.0000 mg | Freq: Once | INTRAVENOUS | Status: AC
  Administered 2016-05-03: 10 mg via INTRAVENOUS
  Filled 2016-05-03: qty 1

## 2016-05-03 NOTE — Patient Instructions (Signed)
Carfilzomib injection What is this medicine? CARFILZOMIB (kar FILZ oh mib) targets a specific protein within cancer cells and stops the cancer cells from growing. It is used to treat multiple myeloma. This medicine may be used for other purposes; ask your health care provider or pharmacist if you have questions. What should I tell my health care provider before I take this medicine? They need to know if you have any of these conditions: -heart disease -history of blood clots -irregular heartbeat -kidney disease -liver disease -lung or breathing disease -an unusual or allergic reaction to carfilzomib, or other medicines, foods, dyes, or preservatives -pregnant or trying to get pregnant -breast-feeding How should I use this medicine? This medicine is for injection or infusion into a vein. It is given by a health care professional in a hospital or clinic setting. Talk to your pediatrician regarding the use of this medicine in children. Special care may be needed. Overdosage: If you think you have taken too much of this medicine contact a poison control center or emergency room at once. NOTE: This medicine is only for you. Do not share this medicine with others. What if I miss a dose? It is important not to miss your dose. Call your doctor or health care professional if you are unable to keep an appointment. What may interact with this medicine? Interactions are not expected. Give your health care provider a list of all the medicines, herbs, non-prescription drugs, or dietary supplements you use. Also tell them if you smoke, drink alcohol, or use illegal drugs. Some items may interact with your medicine. This list may not describe all possible interactions. Give your health care provider a list of all the medicines, herbs, non-prescription drugs, or dietary supplements you use. Also tell them if you smoke, drink alcohol, or use illegal drugs. Some items may interact with your medicine. What  should I watch for while using this medicine? Your condition will be monitored carefully while you are receiving this medicine. Report any side effects. Continue your course of treatment even though you feel ill unless your doctor tells you to stop. You may need blood work done while you are taking this medicine. Do not become pregnant while taking this medicine or for at least 30 days after stopping it. Women should inform their doctor if they wish to become pregnant or think they might be pregnant. There is a potential for serious side effects to an unborn child. Men should not father a child while taking this medicine and for 90 days after stopping it. Talk to your health care professional or pharmacist for more information. Do not breast-feed an infant while taking this medicine. Check with your doctor or health care professional if you get an attack of severe diarrhea, nausea and vomiting, or if you sweat a lot. The loss of too much body fluid can make it dangerous for you to take this medicine. You may get dizzy. Do not drive, use machinery, or do anything that needs mental alertness until you know how this medicine affects you. Do not stand or sit up quickly, especially if you are an older patient. This reduces the risk of dizzy or fainting spells. What side effects may I notice from receiving this medicine? Side effects that you should report to your doctor or health care professional as soon as possible: -allergic reactions like skin rash, itching or hives, swelling of the face, lips, or tongue -confusion -dizziness -feeling faint or lightheaded -fever or chills -palpitations -seizures -signs and  symptoms of bleeding such as bloody or black, tarry stools; red or dark-brown urine; spitting up blood or brown material that looks like coffee grounds; red spots on the skin; unusual bruising or bleeding including from the eye, gums, or nose -signs and symptoms of a blood clot such as breathing  problems; changes in vision; chest pain; severe, sudden headache; pain, swelling, warmth in the leg; trouble speaking; sudden numbness or weakness of the face, arm or leg -signs and symptoms of kidney injury like trouble passing urine or change in the amount of urine -signs and symptoms of liver injury like dark yellow or brown urine; general ill feeling or flu-like symptoms; light-colored stools; loss of appetite; nausea; right upper belly pain; unusually weak or tired; yellowing of the eyes or skin Side effects that usually do not require medical attention (report to your doctor or health care professional if they continue or are bothersome): -back pain -cough -diarrhea -headache -muscle cramps -vomiting This list may not describe all possible side effects. Call your doctor for medical advice about side effects. You may report side effects to FDA at 1-800-FDA-1088. Where should I keep my medicine? This drug is given in a hospital or clinic and will not be stored at home. NOTE: This sheet is a summary. It may not cover all possible information. If you have questions about this medicine, talk to your doctor, pharmacist, or health care provider.    2016, [employer]. (2015-03-21 16:16:00)

## 2016-05-06 DIAGNOSIS — L570 Actinic keratosis: Secondary | ICD-10-CM | POA: Diagnosis not present

## 2016-05-06 DIAGNOSIS — C44319 Basal cell carcinoma of skin of other parts of face: Secondary | ICD-10-CM | POA: Diagnosis not present

## 2016-05-06 DIAGNOSIS — Z23 Encounter for immunization: Secondary | ICD-10-CM | POA: Diagnosis not present

## 2016-05-06 DIAGNOSIS — L821 Other seborrheic keratosis: Secondary | ICD-10-CM | POA: Diagnosis not present

## 2016-05-08 ENCOUNTER — Ambulatory Visit: Payer: Medicare HMO

## 2016-05-08 ENCOUNTER — Ambulatory Visit: Payer: Medicare HMO | Admitting: Hematology & Oncology

## 2016-05-08 ENCOUNTER — Other Ambulatory Visit: Payer: Medicare HMO

## 2016-05-10 DIAGNOSIS — R634 Abnormal weight loss: Secondary | ICD-10-CM | POA: Diagnosis not present

## 2016-05-10 DIAGNOSIS — M545 Low back pain: Secondary | ICD-10-CM | POA: Diagnosis not present

## 2016-05-10 DIAGNOSIS — Z23 Encounter for immunization: Secondary | ICD-10-CM | POA: Diagnosis not present

## 2016-05-10 DIAGNOSIS — C9000 Multiple myeloma not having achieved remission: Secondary | ICD-10-CM | POA: Diagnosis not present

## 2016-05-10 DIAGNOSIS — M859 Disorder of bone density and structure, unspecified: Secondary | ICD-10-CM | POA: Diagnosis not present

## 2016-05-10 DIAGNOSIS — M25552 Pain in left hip: Secondary | ICD-10-CM | POA: Diagnosis not present

## 2016-05-10 DIAGNOSIS — Z1389 Encounter for screening for other disorder: Secondary | ICD-10-CM | POA: Diagnosis not present

## 2016-05-10 DIAGNOSIS — Z0000 Encounter for general adult medical examination without abnormal findings: Secondary | ICD-10-CM | POA: Diagnosis not present

## 2016-05-10 DIAGNOSIS — M542 Cervicalgia: Secondary | ICD-10-CM | POA: Diagnosis not present

## 2016-05-10 DIAGNOSIS — Z6820 Body mass index (BMI) 20.0-20.9, adult: Secondary | ICD-10-CM | POA: Diagnosis not present

## 2016-05-15 ENCOUNTER — Other Ambulatory Visit: Payer: Self-pay | Admitting: *Deleted

## 2016-05-15 DIAGNOSIS — C9002 Multiple myeloma in relapse: Secondary | ICD-10-CM

## 2016-05-16 ENCOUNTER — Other Ambulatory Visit (HOSPITAL_BASED_OUTPATIENT_CLINIC_OR_DEPARTMENT_OTHER): Payer: Medicare HMO

## 2016-05-16 ENCOUNTER — Encounter: Payer: Self-pay | Admitting: Family

## 2016-05-16 ENCOUNTER — Ambulatory Visit (HOSPITAL_BASED_OUTPATIENT_CLINIC_OR_DEPARTMENT_OTHER)
Admission: RE | Admit: 2016-05-16 | Discharge: 2016-05-16 | Disposition: A | Discharge status: Home / Self Care | Payer: Medicare HMO | Source: Ambulatory Visit | Attending: Hematology & Oncology | Admitting: Hematology & Oncology

## 2016-05-16 ENCOUNTER — Ambulatory Visit (HOSPITAL_BASED_OUTPATIENT_CLINIC_OR_DEPARTMENT_OTHER): Payer: Medicare HMO | Admitting: Hematology & Oncology

## 2016-05-16 ENCOUNTER — Ambulatory Visit (HOSPITAL_BASED_OUTPATIENT_CLINIC_OR_DEPARTMENT_OTHER): Payer: Medicare HMO

## 2016-05-16 DIAGNOSIS — M542 Cervicalgia: Secondary | ICD-10-CM | POA: Diagnosis not present

## 2016-05-16 DIAGNOSIS — C9002 Multiple myeloma in relapse: Secondary | ICD-10-CM

## 2016-05-16 DIAGNOSIS — Z5112 Encounter for antineoplastic immunotherapy: Secondary | ICD-10-CM

## 2016-05-16 DIAGNOSIS — C9000 Multiple myeloma not having achieved remission: Secondary | ICD-10-CM | POA: Diagnosis not present

## 2016-05-16 DIAGNOSIS — Z5111 Encounter for antineoplastic chemotherapy: Secondary | ICD-10-CM | POA: Diagnosis not present

## 2016-05-16 DIAGNOSIS — M5136 Other intervertebral disc degeneration, lumbar region: Secondary | ICD-10-CM | POA: Diagnosis not present

## 2016-05-16 DIAGNOSIS — M25551 Pain in right hip: Secondary | ICD-10-CM | POA: Diagnosis not present

## 2016-05-16 LAB — CMP (CANCER CENTER ONLY)
ALT(SGPT): 17 U/L (ref 10–47)
AST: 21 U/L (ref 11–38)
Albumin: 2.7 g/dL — ABNORMAL LOW (ref 3.3–5.5)
Alkaline Phosphatase: 189 U/L — ABNORMAL HIGH (ref 26–84)
BUN: 22 mg/dL (ref 7–22)
CALCIUM: 8.4 mg/dL (ref 8.0–10.3)
CO2: 27 meq/L (ref 18–33)
Chloride: 106 mEq/L (ref 98–108)
Creat: 1.9 mg/dl — ABNORMAL HIGH (ref 0.6–1.2)
GLUCOSE: 102 mg/dL (ref 73–118)
POTASSIUM: 4.3 meq/L (ref 3.3–4.7)
Sodium: 135 mEq/L (ref 128–145)
Total Bilirubin: 0.9 mg/dl (ref 0.20–1.60)
Total Protein: 6.6 g/dL (ref 6.4–8.1)

## 2016-05-16 LAB — CBC WITH DIFFERENTIAL (CANCER CENTER ONLY)
BASO#: 0.1 10*3/uL (ref 0.0–0.2)
BASO%: 1.3 % (ref 0.0–2.0)
EOS%: 4.2 % (ref 0.0–7.0)
Eosinophils Absolute: 0.2 10*3/uL (ref 0.0–0.5)
HEMATOCRIT: 24.6 % — AB (ref 38.7–49.9)
HGB: 8.5 g/dL — ABNORMAL LOW (ref 13.0–17.1)
LYMPH#: 0.2 10*3/uL — AB (ref 0.9–3.3)
LYMPH%: 4.7 % — AB (ref 14.0–48.0)
MCH: 33.5 pg — ABNORMAL HIGH (ref 28.0–33.4)
MCHC: 34.6 g/dL (ref 32.0–35.9)
MCV: 97 fL (ref 82–98)
MONO#: 0.7 10*3/uL (ref 0.1–0.9)
MONO%: 17.2 % — ABNORMAL HIGH (ref 0.0–13.0)
NEUT#: 2.8 10*3/uL (ref 1.5–6.5)
NEUT%: 72.6 % (ref 40.0–80.0)
PLATELETS: 183 10*3/uL (ref 145–400)
RBC: 2.54 10*6/uL — ABNORMAL LOW (ref 4.20–5.70)
RDW: 15 % (ref 11.1–15.7)
WBC: 3.8 10*3/uL — ABNORMAL LOW (ref 4.0–10.0)

## 2016-05-16 MED ORDER — DEXTROSE 5 % IV SOLN
33.0000 mg/m2 | Freq: Once | INTRAVENOUS | Status: AC
  Administered 2016-05-16: 60 mg via INTRAVENOUS
  Filled 2016-05-16: qty 30

## 2016-05-16 MED ORDER — SODIUM CHLORIDE 0.9 % IV SOLN: Freq: Once | INTRAVENOUS | Status: DC

## 2016-05-16 MED ORDER — SODIUM CHLORIDE 0.9 % IV SOLN
240.0000 mg/m2 | Freq: Once | INTRAVENOUS | Status: AC
  Administered 2016-05-16: 440 mg via INTRAVENOUS
  Filled 2016-05-16: qty 22

## 2016-05-16 MED ORDER — PALONOSETRON HCL INJECTION 0.25 MG/5ML
0.2500 mg | Freq: Once | INTRAVENOUS | Status: AC
  Administered 2016-05-16: 0.25 mg via INTRAVENOUS

## 2016-05-16 MED ORDER — SODIUM CHLORIDE 0.9 % IV SOLN
10.0000 mg | Freq: Once | INTRAVENOUS | Status: AC
  Administered 2016-05-16: 10 mg via INTRAVENOUS
  Filled 2016-05-16: qty 1

## 2016-05-16 MED ORDER — PALONOSETRON HCL INJECTION 0.25 MG/5ML
INTRAVENOUS | Status: AC
  Filled 2016-05-16: qty 5

## 2016-05-16 NOTE — Progress Notes (Signed)
Hematology and Oncology Follow Up Visit  Christian Burns [id number] [birth year] 82 y.o. 05/16/2016   Principle Diagnosis:   Recurrent IgG lambda myeloma - progressive  Hypercalcemia of malignancy.  Current Therapy:        Kyprolis/Cytoxan - s/p cycle #2  Cytoxan 250mg  po q wk (3/1)/Ixazomib 4mg  po q week (3/1) - s/p cycle #4 - progression on 04/05/2016  Zometa 3.3 mg IV q. 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  Burns is back for followup.he has lost some weight. I am a little bothered by this area and he says his appetite is okay although he just does not have a lot of taste for food.  He is having some pain in the right hip. He has had this pain before.   he does see his family doctor yesterday. His family doctor wants him to have some x-rays. I will order these since he is here in the office today.  We last saw him before he started his therapy, his M spike was 3.3 g/dL. His IgG level was 4625 milligrams per deciliter. His lambda light chain was 1255 milligrams per deciliter.  He's tolerated his first cycle treatment pretty well.  He has had no cough or shortness of breath. He has had no headache.  Overall, his performance status is ECOG 2.   Medications:  Current Outpatient Prescriptions:  .  aspirin EC 81 MG tablet, Take 81 mg by mouth daily. , Disp: , Rfl:  .  Calcium Carb-Cholecalciferol (CALCIUM 600 + D PO), Take by mouth every morning., Disp: , Rfl:  .  Cyanocobalamin (VITAMIN B 12 PO), Take 1,000 mcg by mouth every morning. , Disp: , Rfl:  .  dexamethasone (DECADRON) 4 MG tablet, Take 5 pills, WITH FOOD, once a week with your chemotherapy pills., Disp: 60 tablet, Rfl: 3 .  HYDROcodone-acetaminophen (NORCO/VICODIN) 5-325 MG tablet, Take 1 tablet by mouth every 6 (six) hours as needed for moderate pain., Disp: 60 tablet, Rfl: 0 .  LORazepam (ATIVAN) 0.5 MG tablet, Take 1 tablet (0.5 mg total) by mouth every 6 (six) hours as  needed (Nausea or vomiting)., Disp: 30 tablet, Rfl: 0 .  Multiple Vitamins-Minerals (MULTIVITAL PO), Take 1 tablet by mouth every morning. , Disp: , Rfl:  .  ondansetron (ZOFRAN) 8 MG tablet, Take 1 tablet (8 mg total) by mouth 2 (two) times daily as needed for refractory nausea / vomiting. Take as needed on days 4-7, 11-14, 18-28., Disp: 30 tablet, Rfl: 1 .  prochlorperazine (COMPAZINE) 10 MG tablet, Take 1 tablet (10 mg total) by mouth every 6 (six) hours as needed (Nausea or vomiting)., Disp: 30 tablet, Rfl: 1 .  Pyridoxine HCl (VITAMIN B-6) 250 MG tablet, Take 500 mg by mouth daily., Disp: , Rfl:  .  Vitamin D, Ergocalciferol, (DRISDOL) 50000 UNITS CAPS capsule, Take 50,000 Units by mouth every 7 (seven) days. Sundays, Disp: , Rfl:  No current facility-administered medications for this visit.   Facility-Administered Medications Ordered in Other Visits:  .  0.9 %  sodium chloride infusion, , Intravenous, Once, [name], MD .  0.9 %  sodium chloride infusion, , Intravenous, Once, [name], MD .  carfilzomib (KYPROLIS) 60 mg in dextrose 5 % 100 mL chemo infusion, 33 mg/m2 (Treatment Plan Recorded), Intravenous, Once, [name], MD .  cyclophosphamide (CYTOXAN) 440 mg in sodium chloride 0.9 % 250 mL chemo infusion, 240 mg/m2 (Treatment Plan  Recorded), Intravenous, Once, [name], MD .  dexamethasone (DECADRON) 10 mg in sodium chloride 0.9 % 50 mL IVPB, 10 mg, Intravenous, Once, [name], MD, 10 mg at 05/16/16 1026 .  palonosetron (ALOXI) injection 0.25 mg, 0.25 mg, Intravenous, Once, [name], MD  Allergies:  Allergies  Allergen Reactions  . Sulfa Antibiotics Itching    Past Medical History, Surgical history, Social history, and Family History were reviewed and updated.  Review of Systems: As above  Physical Exam:  height is 5\' 11"  (1.803 m) and weight is 140 lb 12.8 oz (63.9 kg). His oral temperature is 97.9 F (36.6 C). His blood pressure is  138/74 and his pulse is 71. His respiration is 16.   Thin, elderly gentleman. He is no distress. Head and neck exam shows no ocular or oral lesions. He has no palpable cervical or supraclavicular lymph nodes. Lungs are clear bilaterally. Cardiac exam is regular rate and rhythm with no murmurs rubs or bruits. Abdomen is soft. He has good bowel sounds. There is no fluid wave. There is no palpable liver or spleen. Back exam no tenderness over the spine ribs or hips. He has some kyphosis. Extremities shows some age related changes. He has good range of motion and strength. Skin exam shows no rashes. Neurological exam is nonfocal.  Lab Results  Component Value Date   WBC 3.8 (L) 05/16/2016   HGB 8.5 (L) 05/16/2016   HCT 24.6 (L) 05/16/2016   MCV 97 05/16/2016   PLT 183 05/16/2016     Chemistry      Component Value Date/Time   NA 135 05/16/2016 0915   K 4.3 05/16/2016 0915   CL 106 05/16/2016 0915   CO2 27 05/16/2016 0915   BUN 22 05/16/2016 0915   CREATININE 1.9 (H) 05/16/2016 0915      Component Value Date/Time   CALCIUM 8.4 05/16/2016 0915   ALKPHOS 189 (H) 05/16/2016 0915   AST 21 05/16/2016 0915   ALT 17 05/16/2016 0915   BILITOT 0.90 05/16/2016 0915       Impression and Plan: Christian Burns is an 82 year old gentleman with IgG lambda myeloma.   Hopefully, we are starting to see a response already. His total protein is coming down. His calcium is come down a little bit.  Mostly, I'm thankful that he is tolerated treatment well to date.  We will continue him on therapy. Hopefully we will see that his myeloma values are improving. Hopefully, his weight will start come up. This I think we'll really tell us how well he is doing.   His hemoglobin is low but stable. He is not that symptomatic with it. I will try to hold off on transfusing him for right now.   We will see what his x-rays have to show.   We will have him come back in a couple weeks to follow-up to make sure that he is  doing okay.   I spent about 25 minutes with him today.     [name], MD 10/5/201710:39 AM

## 2016-05-16 NOTE — Patient Instructions (Signed)
Cyclophosphamide injection What is this medicine? CYCLOPHOSPHAMIDE (sye kloe FOSS fa mide) is a chemotherapy drug. It slows the growth of cancer cells. This medicine is used to treat many types of cancer like lymphoma, myeloma, leukemia, breast cancer, and ovarian cancer, to name a few. This medicine may be used for other purposes; ask your health care provider or pharmacist if you have questions. What should I tell my health care provider before I take this medicine? They need to know if you have any of these conditions: -blood disorders -history of other chemotherapy -infection -kidney disease -liver disease -recent or ongoing radiation therapy -tumors in the bone marrow -an unusual or allergic reaction to cyclophosphamide, other chemotherapy, other medicines, foods, dyes, or preservatives -pregnant or trying to get pregnant -breast-feeding How should I use this medicine? This drug is usually given as an injection into a vein or muscle or by infusion into a vein. It is administered in a hospital or clinic by a specially trained health care professional. Talk to your pediatrician regarding the use of this medicine in children. Special care may be needed. Overdosage: If you think you have taken too much of this medicine contact a poison control center or emergency room at once. NOTE: This medicine is only for you. Do not share this medicine with others. What if I miss a dose? It is important not to miss your dose. Call your doctor or health care professional if you are unable to keep an appointment. What may interact with this medicine? This medicine may interact with the following medications: -amiodarone -amphotericin B -azathioprine -certain antiviral medicines for HIV or AIDS such as protease inhibitors (e.g., indinavir, ritonavir) and zidovudine -certain blood pressure medications such as benazepril, captopril, enalapril, fosinopril, lisinopril, moexipril, monopril, perindopril,  quinapril, ramipril, trandolapril -certain cancer medications such as anthracyclines (e.g., daunorubicin, doxorubicin), busulfan, cytarabine, paclitaxel, pentostatin, tamoxifen, trastuzumab -certain diuretics such as chlorothiazide, chlorthalidone, hydrochlorothiazide, indapamide, metolazone -certain medicines that treat or prevent blood clots like warfarin -certain muscle relaxants such as succinylcholine -cyclosporine -etanercept -indomethacin -medicines to increase blood counts like filgrastim, pegfilgrastim, sargramostim -medicines used as general anesthesia -metronidazole -natalizumab This list may not describe all possible interactions. Give your health care provider a list of all the medicines, herbs, non-prescription drugs, or dietary supplements you use. Also tell them if you smoke, drink alcohol, or use illegal drugs. Some items may interact with your medicine. What should I watch for while using this medicine? Visit your doctor for checks on your progress. This drug may make you feel generally unwell. This is not uncommon, as chemotherapy can affect healthy cells as well as cancer cells. Report any side effects. Continue your course of treatment even though you feel ill unless your doctor tells you to stop. Drink water or other fluids as directed. Urinate often, even at night. In some cases, you may be given additional medicines to help with side effects. Follow all directions for their use. Call your doctor or health care professional for advice if you get a fever, chills or sore throat, or other symptoms of a cold or flu. Do not treat yourself. This drug decreases your body's ability to fight infections. Try to avoid being around people who are sick. This medicine may increase your risk to bruise or bleed. Call your doctor or health care professional if you notice any unusual bleeding. Be careful brushing and flossing your teeth or using a toothpick because you may get an infection or  bleed more easily. If you have  any dental work done, tell your dentist you are receiving this medicine. You may get drowsy or dizzy. Do not drive, use machinery, or do anything that needs mental alertness until you know how this medicine affects you. Do not become pregnant while taking this medicine or for 1 year after stopping it. Women should inform their doctor if they wish to become pregnant or think they might be pregnant. Men should not father a child while taking this medicine and for 4 months after stopping it. There is a potential for serious side effects to an unborn child. Talk to your health care professional or pharmacist for more information. Do not breast-feed an infant while taking this medicine. This medicine may interfere with the ability to have a child. This medicine has caused ovarian failure in some women. This medicine has caused reduced sperm counts in some men. You should talk with your doctor or health care professional if you are concerned about your fertility. If you are going to have surgery, tell your doctor or health care professional that you have taken this medicine. What side effects may I notice from receiving this medicine? Side effects that you should report to your doctor or health care professional as soon as possible: -allergic reactions like skin rash, itching or hives, swelling of the face, lips, or tongue -low blood counts - this medicine may decrease the number of white blood cells, red blood cells and platelets. You may be at increased risk for infections and bleeding. -signs of infection - fever or chills, cough, sore throat, pain or difficulty passing urine -signs of decreased platelets or bleeding - bruising, pinpoint red spots on the skin, black, tarry stools, blood in the urine -signs of decreased red blood cells - unusually weak or tired, fainting spells, lightheadedness -breathing problems -dark urine -dizziness -palpitations -swelling of the  ankles, feet, hands -trouble passing urine or change in the amount of urine -weight gain -yellowing of the eyes or skin Side effects that usually do not require medical attention (report to your doctor or health care professional if they continue or are bothersome): -changes in nail or skin color -hair loss -missed menstrual periods -mouth sores -nausea, vomiting This list may not describe all possible side effects. Call your doctor for medical advice about side effects. You may report side effects to FDA at 1-800-FDA-1088. Where should I keep my medicine? This drug is given in a hospital or clinic and will not be stored at home. NOTE: This sheet is a summary. It may not cover all possible information. If you have questions about this medicine, talk to your doctor, pharmacist, or health care provider.    2016, [employer]. (2012-06-12 16:22:58) Carfilzomib injection What is this medicine? CARFILZOMIB (kar FILZ oh mib) targets a specific protein within cancer cells and stops the cancer cells from growing. It is used to treat multiple myeloma. This medicine may be used for other purposes; ask your health care provider or pharmacist if you have questions. What should I tell my health care provider before I take this medicine? They need to know if you have any of these conditions: -heart disease -history of blood clots -irregular heartbeat -kidney disease -liver disease -lung or breathing disease -an unusual or allergic reaction to carfilzomib, or other medicines, foods, dyes, or preservatives -pregnant or trying to get pregnant -breast-feeding How should I use this medicine? This medicine is for injection or infusion into a vein. It is given by a health care professional in a  hospital or clinic setting. Talk to your pediatrician regarding the use of this medicine in children. Special care may be needed. Overdosage: If you think you have taken too much of this medicine  contact a poison control center or emergency room at once. NOTE: This medicine is only for you. Do not share this medicine with others. What if I miss a dose? It is important not to miss your dose. Call your doctor or health care professional if you are unable to keep an appointment. What may interact with this medicine? Interactions are not expected. Give your health care provider a list of all the medicines, herbs, non-prescription drugs, or dietary supplements you use. Also tell them if you smoke, drink alcohol, or use illegal drugs. Some items may interact with your medicine. This list may not describe all possible interactions. Give your health care provider a list of all the medicines, herbs, non-prescription drugs, or dietary supplements you use. Also tell them if you smoke, drink alcohol, or use illegal drugs. Some items may interact with your medicine. What should I watch for while using this medicine? Your condition will be monitored carefully while you are receiving this medicine. Report any side effects. Continue your course of treatment even though you feel ill unless your doctor tells you to stop. You may need blood work done while you are taking this medicine. Do not become pregnant while taking this medicine or for at least 30 days after stopping it. Women should inform their doctor if they wish to become pregnant or think they might be pregnant. There is a potential for serious side effects to an unborn child. Men should not father a child while taking this medicine and for 90 days after stopping it. Talk to your health care professional or pharmacist for more information. Do not breast-feed an infant while taking this medicine. Check with your doctor or health care professional if you get an attack of severe diarrhea, nausea and vomiting, or if you sweat a lot. The loss of too much body fluid can make it dangerous for you to take this medicine. You may get dizzy. Do not drive, use  machinery, or do anything that needs mental alertness until you know how this medicine affects you. Do not stand or sit up quickly, especially if you are an older patient. This reduces the risk of dizzy or fainting spells. What side effects may I notice from receiving this medicine? Side effects that you should report to your doctor or health care professional as soon as possible: -allergic reactions like skin rash, itching or hives, swelling of the face, lips, or tongue -confusion -dizziness -feeling faint or lightheaded -fever or chills -palpitations -seizures -signs and symptoms of bleeding such as bloody or black, tarry stools; red or dark-brown urine; spitting up blood or brown material that looks like coffee grounds; red spots on the skin; unusual bruising or bleeding including from the eye, gums, or nose -signs and symptoms of a blood clot such as breathing problems; changes in vision; chest pain; severe, sudden headache; pain, swelling, warmth in the leg; trouble speaking; sudden numbness or weakness of the face, arm or leg -signs and symptoms of kidney injury like trouble passing urine or change in the amount of urine -signs and symptoms of liver injury like dark yellow or brown urine; general ill feeling or flu-like symptoms; light-colored stools; loss of appetite; nausea; right upper belly pain; unusually weak or tired; yellowing of the eyes or skin Side effects that usually  do not require medical attention (report to your doctor or health care professional if they continue or are bothersome): -back pain -cough -diarrhea -headache -muscle cramps -vomiting This list may not describe all possible side effects. Call your doctor for medical advice about side effects. You may report side effects to FDA at 1-800-FDA-1088. Where should I keep my medicine? This drug is given in a hospital or clinic and will not be stored at home. NOTE: This sheet is a summary. It may not cover all possible  information. If you have questions about this medicine, talk to your doctor, pharmacist, or health care provider.    2016, [employer]. (2015-03-21 16:16:00)

## 2016-05-16 NOTE — Progress Notes (Signed)
erroneous

## 2016-05-17 ENCOUNTER — Ambulatory Visit (HOSPITAL_BASED_OUTPATIENT_CLINIC_OR_DEPARTMENT_OTHER): Payer: Medicare HMO

## 2016-05-17 VITALS — BP 132/64 | HR 70 | Temp 97.6°F | Resp 16

## 2016-05-17 DIAGNOSIS — C9002 Multiple myeloma in relapse: Secondary | ICD-10-CM

## 2016-05-17 DIAGNOSIS — C9000 Multiple myeloma not having achieved remission: Secondary | ICD-10-CM

## 2016-05-17 DIAGNOSIS — Z5112 Encounter for antineoplastic immunotherapy: Secondary | ICD-10-CM | POA: Diagnosis not present

## 2016-05-17 MED ORDER — DEXTROSE 5 % IV SOLN
33.0000 mg/m2 | Freq: Once | INTRAVENOUS | Status: AC
  Administered 2016-05-17: 60 mg via INTRAVENOUS
  Filled 2016-05-17: qty 30

## 2016-05-17 MED ORDER — SODIUM CHLORIDE 0.9 % IV SOLN
Freq: Once | INTRAVENOUS | Status: AC
  Administered 2016-05-17: 12:00:00 via INTRAVENOUS

## 2016-05-17 MED ORDER — SODIUM CHLORIDE 0.9 % IV SOLN
10.0000 mg | Freq: Once | INTRAVENOUS | Status: AC
  Administered 2016-05-17: 10 mg via INTRAVENOUS
  Filled 2016-05-17: qty 1

## 2016-05-17 NOTE — Patient Instructions (Signed)
Carfilzomib injection What is this medicine? CARFILZOMIB (kar FILZ oh mib) targets a specific protein within cancer cells and stops the cancer cells from growing. It is used to treat multiple myeloma. This medicine may be used for other purposes; ask your health care provider or pharmacist if you have questions. What should I tell my health care provider before I take this medicine? They need to know if you have any of these conditions: -heart disease -history of blood clots -irregular heartbeat -kidney disease -liver disease -lung or breathing disease -an unusual or allergic reaction to carfilzomib, or other medicines, foods, dyes, or preservatives -pregnant or trying to get pregnant -breast-feeding How should I use this medicine? This medicine is for injection or infusion into a vein. It is given by a health care professional in a hospital or clinic setting. Talk to your pediatrician regarding the use of this medicine in children. Special care may be needed. Overdosage: If you think you have taken too much of this medicine contact a poison control center or emergency room at once. NOTE: This medicine is only for you. Do not share this medicine with others. What if I miss a dose? It is important not to miss your dose. Call your doctor or health care professional if you are unable to keep an appointment. What may interact with this medicine? Interactions are not expected. Give your health care provider a list of all the medicines, herbs, non-prescription drugs, or dietary supplements you use. Also tell them if you smoke, drink alcohol, or use illegal drugs. Some items may interact with your medicine. This list may not describe all possible interactions. Give your health care provider a list of all the medicines, herbs, non-prescription drugs, or dietary supplements you use. Also tell them if you smoke, drink alcohol, or use illegal drugs. Some items may interact with your medicine. What  should I watch for while using this medicine? Your condition will be monitored carefully while you are receiving this medicine. Report any side effects. Continue your course of treatment even though you feel ill unless your doctor tells you to stop. You may need blood work done while you are taking this medicine. Do not become pregnant while taking this medicine or for at least 30 days after stopping it. Women should inform their doctor if they wish to become pregnant or think they might be pregnant. There is a potential for serious side effects to an unborn child. Men should not father a child while taking this medicine and for 90 days after stopping it. Talk to your health care professional or pharmacist for more information. Do not breast-feed an infant while taking this medicine. Check with your doctor or health care professional if you get an attack of severe diarrhea, nausea and vomiting, or if you sweat a lot. The loss of too much body fluid can make it dangerous for you to take this medicine. You may get dizzy. Do not drive, use machinery, or do anything that needs mental alertness until you know how this medicine affects you. Do not stand or sit up quickly, especially if you are an older patient. This reduces the risk of dizzy or fainting spells. What side effects may I notice from receiving this medicine? Side effects that you should report to your doctor or health care professional as soon as possible: -allergic reactions like skin rash, itching or hives, swelling of the face, lips, or tongue -confusion -dizziness -feeling faint or lightheaded -fever or chills -palpitations -seizures -signs and   symptoms of bleeding such as bloody or black, tarry stools; red or dark-brown urine; spitting up blood or brown material that looks like coffee grounds; red spots on the skin; unusual bruising or bleeding including from the eye, gums, or nose -signs and symptoms of a blood clot such as breathing  problems; changes in vision; chest pain; severe, sudden headache; pain, swelling, warmth in the leg; trouble speaking; sudden numbness or weakness of the face, arm or leg -signs and symptoms of kidney injury like trouble passing urine or change in the amount of urine -signs and symptoms of liver injury like dark yellow or brown urine; general ill feeling or flu-like symptoms; light-colored stools; loss of appetite; nausea; right upper belly pain; unusually weak or tired; yellowing of the eyes or skin Side effects that usually do not require medical attention (report to your doctor or health care professional if they continue or are bothersome): -back pain -cough -diarrhea -headache -muscle cramps -vomiting This list may not describe all possible side effects. Call your doctor for medical advice about side effects. You may report side effects to FDA at 1-800-FDA-1088. Where should I keep my medicine? This drug is given in a hospital or clinic and will not be stored at home. NOTE: This sheet is a summary. It may not cover all possible information. If you have questions about this medicine, talk to your doctor, pharmacist, or health care provider.    2016, Elsevier/Gold Standard. (2015-03-21 16:16:00)  

## 2016-05-21 LAB — MULTIPLE MYELOMA PANEL, SERUM
ALBUMIN SERPL ELPH-MCNC: 2.9 g/dL (ref 2.9–4.4)
ALPHA 1: 0.3 g/dL (ref 0.0–0.4)
Albumin/Glob SerPl: 0.9 (ref 0.7–1.7)
Alpha2 Glob SerPl Elph-Mcnc: 0.8 g/dL (ref 0.4–1.0)
B-Globulin SerPl Elph-Mcnc: 0.8 g/dL (ref 0.7–1.3)
GAMMA GLOB SERPL ELPH-MCNC: 1.4 g/dL (ref 0.4–1.8)
GLOBULIN, TOTAL: 3.3 g/dL (ref 2.2–3.9)
IGA/IMMUNOGLOBULIN A, SERUM: 68 mg/dL (ref 61–437)
IGM (IMMUNOGLOBIN M), SRM: 19 mg/dL (ref 15–143)
IgG, Qn, Serum: 1668 mg/dL — ABNORMAL HIGH (ref 700–1600)
M Protein SerPl Elph-Mcnc: 1.2 g/dL — ABNORMAL HIGH
TOTAL PROTEIN: 6.2 g/dL (ref 6.0–8.5)

## 2016-05-23 ENCOUNTER — Ambulatory Visit (HOSPITAL_BASED_OUTPATIENT_CLINIC_OR_DEPARTMENT_OTHER): Payer: Medicare HMO

## 2016-05-23 ENCOUNTER — Ambulatory Visit (HOSPITAL_COMMUNITY)
Admission: RE | Admit: 2016-05-23 | Discharge: 2016-05-23 | Disposition: A | Discharge status: Home / Self Care | Payer: Medicare HMO | Source: Ambulatory Visit | Attending: Hematology & Oncology | Admitting: Hematology & Oncology

## 2016-05-23 ENCOUNTER — Other Ambulatory Visit: Payer: Self-pay | Admitting: Family

## 2016-05-23 ENCOUNTER — Other Ambulatory Visit (HOSPITAL_BASED_OUTPATIENT_CLINIC_OR_DEPARTMENT_OTHER): Payer: Medicare HMO

## 2016-05-23 VITALS — BP 152/68 | HR 75 | Temp 97.8°F | Resp 16

## 2016-05-23 DIAGNOSIS — Z5112 Encounter for antineoplastic immunotherapy: Secondary | ICD-10-CM | POA: Diagnosis not present

## 2016-05-23 DIAGNOSIS — C9002 Multiple myeloma in relapse: Secondary | ICD-10-CM

## 2016-05-23 DIAGNOSIS — Z5111 Encounter for antineoplastic chemotherapy: Secondary | ICD-10-CM | POA: Diagnosis not present

## 2016-05-23 DIAGNOSIS — D630 Anemia in neoplastic disease: Secondary | ICD-10-CM | POA: Insufficient documentation

## 2016-05-23 DIAGNOSIS — C9000 Multiple myeloma not having achieved remission: Secondary | ICD-10-CM

## 2016-05-23 LAB — CMP (CANCER CENTER ONLY)
ALT(SGPT): 19 U/L (ref 10–47)
AST: 22 U/L (ref 11–38)
Albumin: 2.7 g/dL — ABNORMAL LOW (ref 3.3–5.5)
Alkaline Phosphatase: 160 U/L — ABNORMAL HIGH (ref 26–84)
BUN, Bld: 28 mg/dL — ABNORMAL HIGH (ref 7–22)
CO2: 22 mEq/L (ref 18–33)
Calcium: 8.4 mg/dL (ref 8.0–10.3)
Chloride: 108 mEq/L (ref 98–108)
Creat: 2.1 mg/dl — ABNORMAL HIGH (ref 0.6–1.2)
Glucose, Bld: 128 mg/dL — ABNORMAL HIGH (ref 73–118)
Potassium: 4.2 mEq/L (ref 3.3–4.7)
Sodium: 140 mEq/L (ref 128–145)
Total Bilirubin: 0.8 mg/dl (ref 0.20–1.60)
Total Protein: 6.1 g/dL — ABNORMAL LOW (ref 6.4–8.1)

## 2016-05-23 LAB — CBC WITH DIFFERENTIAL (CANCER CENTER ONLY)
BASO#: 0 10*3/uL (ref 0.0–0.2)
BASO%: 0.5 % (ref 0.0–2.0)
EOS%: 3.9 % (ref 0.0–7.0)
Eosinophils Absolute: 0.2 10*3/uL (ref 0.0–0.5)
HCT: 23.5 % — ABNORMAL LOW (ref 38.7–49.9)
HGB: 8.3 g/dL — ABNORMAL LOW (ref 13.0–17.1)
LYMPH#: 0.2 10*3/uL — ABNORMAL LOW (ref 0.9–3.3)
LYMPH%: 5.3 % — ABNORMAL LOW (ref 14.0–48.0)
MCH: 33.7 pg — ABNORMAL HIGH (ref 28.0–33.4)
MCHC: 35.3 g/dL (ref 32.0–35.9)
MCV: 96 fL (ref 82–98)
MONO#: 0.7 10*3/uL (ref 0.1–0.9)
MONO%: 15.2 % — ABNORMAL HIGH (ref 0.0–13.0)
NEUT#: 3.3 10*3/uL (ref 1.5–6.5)
NEUT%: 75.1 % (ref 40.0–80.0)
Platelets: 119 10*3/uL — ABNORMAL LOW (ref 145–400)
RBC: 2.46 10*6/uL — ABNORMAL LOW (ref 4.20–5.70)
RDW: 14.5 % (ref 11.1–15.7)
WBC: 4.3 10*3/uL (ref 4.0–10.0)

## 2016-05-23 LAB — PREPARE RBC (CROSSMATCH)

## 2016-05-23 MED ORDER — SODIUM CHLORIDE 0.9 % IV SOLN
10.0000 mg | Freq: Once | INTRAVENOUS | Status: AC
  Administered 2016-05-23: 10 mg via INTRAVENOUS
  Filled 2016-05-23: qty 1

## 2016-05-23 MED ORDER — PALONOSETRON HCL INJECTION 0.25 MG/5ML
INTRAVENOUS | Status: AC
  Filled 2016-05-23: qty 5

## 2016-05-23 MED ORDER — DEXTROSE 5 % IV SOLN
60.0000 mg | Freq: Once | INTRAVENOUS | Status: AC
  Administered 2016-05-23: 60 mg via INTRAVENOUS
  Filled 2016-05-23: qty 30

## 2016-05-23 MED ORDER — SODIUM CHLORIDE 0.9 % IV SOLN
Freq: Once | INTRAVENOUS | Status: AC
  Administered 2016-05-23: 11:00:00 via INTRAVENOUS

## 2016-05-23 MED ORDER — SODIUM CHLORIDE 0.9 % IV SOLN
240.0000 mg/m2 | Freq: Once | INTRAVENOUS | Status: AC
  Administered 2016-05-23: 440 mg via INTRAVENOUS
  Filled 2016-05-23: qty 22

## 2016-05-23 MED ORDER — PALONOSETRON HCL INJECTION 0.25 MG/5ML
0.2500 mg | Freq: Once | INTRAVENOUS | Status: AC
  Administered 2016-05-23: 0.25 mg via INTRAVENOUS

## 2016-05-23 NOTE — Progress Notes (Signed)
Hgb 8.3 and creatinine 2.1 continue with treatment today and will transfused blood tomorrow per Dr. Marin Olp order.

## 2016-05-23 NOTE — Patient Instructions (Signed)
[facility] Discharge Instructions for Patients Receiving Chemotherapy  Today you received the following chemotherapy agents Kyprolis and Cytoxan  To help prevent nausea and vomiting after your treatment, we encourage you to take your nausea medication.   If you develop nausea and vomiting that is not controlled by your nausea medication, call the clinic.   BELOW ARE SYMPTOMS THAT SHOULD BE REPORTED IMMEDIATELY:  *FEVER GREATER THAN 100.5 F  *CHILLS WITH OR WITHOUT FEVER  NAUSEA AND VOMITING THAT IS NOT CONTROLLED WITH YOUR NAUSEA MEDICATION  *UNUSUAL SHORTNESS OF BREATH  *UNUSUAL BRUISING OR BLEEDING  TENDERNESS IN MOUTH AND THROAT WITH OR WITHOUT PRESENCE OF ULCERS  *URINARY PROBLEMS  *BOWEL PROBLEMS  UNUSUAL RASH Items with * indicate a potential emergency and should be followed up as soon as possible.  Feel free to call the clinic you have any questions or concerns. The clinic phone number is [phone].  Please show the CHEMO ALERT CARD at check-in to the Emergency Department and triage nurse.   

## 2016-05-24 ENCOUNTER — Ambulatory Visit (HOSPITAL_BASED_OUTPATIENT_CLINIC_OR_DEPARTMENT_OTHER): Payer: Medicare HMO

## 2016-05-24 VITALS — BP 161/87 | HR 64 | Temp 97.7°F | Resp 18

## 2016-05-24 DIAGNOSIS — C9000 Multiple myeloma not having achieved remission: Secondary | ICD-10-CM | POA: Diagnosis not present

## 2016-05-24 DIAGNOSIS — D630 Anemia in neoplastic disease: Secondary | ICD-10-CM | POA: Diagnosis not present

## 2016-05-24 DIAGNOSIS — Z5112 Encounter for antineoplastic immunotherapy: Secondary | ICD-10-CM

## 2016-05-24 DIAGNOSIS — C9002 Multiple myeloma in relapse: Secondary | ICD-10-CM

## 2016-05-24 MED ORDER — FUROSEMIDE 10 MG/ML IJ SOLN
20.0000 mg | Freq: Once | INTRAMUSCULAR | Status: AC
  Administered 2016-05-24: 20 mg via INTRAVENOUS

## 2016-05-24 MED ORDER — ACETAMINOPHEN 325 MG PO TABS
ORAL_TABLET | ORAL | Status: AC
  Filled 2016-05-24: qty 2

## 2016-05-24 MED ORDER — SODIUM CHLORIDE 0.9 % IV SOLN
250.0000 mL | Freq: Once | INTRAVENOUS | Status: AC
  Administered 2016-05-24: 250 mL via INTRAVENOUS

## 2016-05-24 MED ORDER — SODIUM CHLORIDE 0.9 % IV SOLN
Freq: Once | INTRAVENOUS | Status: AC
  Administered 2016-05-24: 15:00:00 via INTRAVENOUS

## 2016-05-24 MED ORDER — SODIUM CHLORIDE 0.9 % IV SOLN: 10.0000 mg | Freq: Once | INTRAVENOUS | Status: DC

## 2016-05-24 MED ORDER — DEXTROSE 5 % IV SOLN
60.0000 mg | Freq: Once | INTRAVENOUS | Status: AC
  Administered 2016-05-24: 60 mg via INTRAVENOUS
  Filled 2016-05-24: qty 30

## 2016-05-24 MED ORDER — DEXAMETHASONE SODIUM PHOSPHATE 10 MG/ML IJ SOLN
10.0000 mg | Freq: Once | INTRAMUSCULAR | Status: AC
  Administered 2016-05-24: 10 mg via INTRAVENOUS

## 2016-05-24 MED ORDER — FUROSEMIDE 10 MG/ML IJ SOLN
INTRAMUSCULAR | Status: AC
  Filled 2016-05-24: qty 4

## 2016-05-24 MED ORDER — ACETAMINOPHEN 325 MG PO TABS
650.0000 mg | ORAL_TABLET | Freq: Once | ORAL | Status: AC
  Administered 2016-05-24: 650 mg via ORAL

## 2016-05-24 NOTE — Patient Instructions (Addendum)
Blood Transfusion  A blood transfusion is a procedure that gives you donated blood through an IV tube. You may need blood because of illness, surgery, or injury. The blood may come from a donor. The blood may also be your own blood that you donated earlier. The blood you get is made up of different types of cells. You may get:   Red blood cells. These carry oxygen and replace lost blood.   Platelets. These control bleeding.   Plasma. This helps blood to clot. If you have a clotting disorder, you may also get other types of blood products.  BEFORE THE PROCEDURE  You may have a blood test. This finds out what type of blood you have. It also finds out what kind of blood your body will accept.   If you are going to have a planned surgery, you may donate your own blood. This is done in case you need to have a transfusion.   If you have had an allergic transfusion reaction before, you may be given medicine to help prevent a reaction. Take this medicine only as told by your doctor.  You will have your temperature, blood pressure, and pulse checked. PROCEDURE   An IV will be started in your hand or arm.   The bag of donated blood will be attached to your IV and run into your vein.   A doctor will regularly check your temperature, blood pressure, and pulse during the procedure. This is done to find any early signs of a transfusion reaction.  If you have any signs or symptoms of a reaction, the procedure may be stopped and you may be given medicine.   When the transfusion is over, your IV will be removed.   Pressure may be applied to the IV site for a few minutes.   A bandage (dressing) will be applied.  The procedure may vary among doctors and hospitals.  AFTER THE PROCEDURE  Your blood pressure, temperature, and pulse will be checked regularly.   This information is not intended to replace advice given to you by your health care provider. Make sure you discuss any questions  you have with your health care provider.   Document Released: 10/25/2008 Document Revised: 08/19/2014 Document Reviewed: 06/08/2014 Elsevier Interactive Patient Education 2016 [place] injection What is this medicine? CARFILZOMIB (kar FILZ oh mib) targets a specific protein within cancer cells and stops the cancer cells from growing. It is used to treat multiple myeloma. This medicine may be used for other purposes; ask your health care provider or pharmacist if you have questions. What should I tell my health care provider before I take this medicine? They need to know if you have any of these conditions: -heart disease -history of blood clots -irregular heartbeat -kidney disease -liver disease -lung or breathing disease -an unusual or allergic reaction to carfilzomib, or other medicines, foods, dyes, or preservatives -pregnant or trying to get pregnant -breast-feeding How should I use this medicine? This medicine is for injection or infusion into a vein. It is given by a health care professional in a hospital or clinic setting. Talk to your pediatrician regarding the use of this medicine in children. Special care may be needed. Overdosage: If you think you have taken too much of this medicine contact a poison control center or emergency room at once. NOTE: This medicine is only for you. Do not share this medicine with others. What if I miss a dose? It is important not to miss  your dose. Call your doctor or health care professional if you are unable to keep an appointment. What may interact with this medicine? Interactions are not expected. Give your health care provider a list of all the medicines, herbs, non-prescription drugs, or dietary supplements you use. Also tell them if you smoke, drink alcohol, or use illegal drugs. Some items may interact with your medicine. This list may not describe all possible interactions. Give your health care provider a list of all  the medicines, herbs, non-prescription drugs, or dietary supplements you use. Also tell them if you smoke, drink alcohol, or use illegal drugs. Some items may interact with your medicine. What should I watch for while using this medicine? Your condition will be monitored carefully while you are receiving this medicine. Report any side effects. Continue your course of treatment even though you feel ill unless your doctor tells you to stop. You may need blood work done while you are taking this medicine. Do not become pregnant while taking this medicine or for at least 30 days after stopping it. Women should inform their doctor if they wish to become pregnant or think they might be pregnant. There is a potential for serious side effects to an unborn child. Men should not father a child while taking this medicine and for 90 days after stopping it. Talk to your health care professional or pharmacist for more information. Do not breast-feed an infant while taking this medicine. Check with your doctor or health care professional if you get an attack of severe diarrhea, nausea and vomiting, or if you sweat a lot. The loss of too much body fluid can make it dangerous for you to take this medicine. You may get dizzy. Do not drive, use machinery, or do anything that needs mental alertness until you know how this medicine affects you. Do not stand or sit up quickly, especially if you are an older patient. This reduces the risk of dizzy or fainting spells. What side effects may I notice from receiving this medicine? Side effects that you should report to your doctor or health care professional as soon as possible: -allergic reactions like skin rash, itching or hives, swelling of the face, lips, or tongue -confusion -dizziness -feeling faint or lightheaded -fever or chills -palpitations -seizures -signs and symptoms of bleeding such as bloody or black, tarry stools; red or dark-brown urine; spitting up blood or  brown material that looks like coffee grounds; red spots on the skin; unusual bruising or bleeding including from the eye, gums, or nose -signs and symptoms of a blood clot such as breathing problems; changes in vision; chest pain; severe, sudden headache; pain, swelling, warmth in the leg; trouble speaking; sudden numbness or weakness of the face, arm or leg -signs and symptoms of kidney injury like trouble passing urine or change in the amount of urine -signs and symptoms of liver injury like dark yellow or brown urine; general ill feeling or flu-like symptoms; light-colored stools; loss of appetite; nausea; right upper belly pain; unusually weak or tired; yellowing of the eyes or skin Side effects that usually do not require medical attention (report to your doctor or health care professional if they continue or are bothersome): -back pain -cough -diarrhea -headache -muscle cramps -vomiting This list may not describe all possible side effects. Call your doctor for medical advice about side effects. You may report side effects to FDA at 1-800-FDA-1088. Where should I keep my medicine? This drug is given in a hospital or clinic  and will not be stored at home. NOTE: This sheet is a summary. It may not cover all possible information. If you have questions about this medicine, talk to your doctor, pharmacist, or health care provider.    2016, Elsevier/Gold Standard. (2015-03-21 16:16:00)

## 2016-05-27 ENCOUNTER — Encounter: Payer: Self-pay | Admitting: Hematology & Oncology

## 2016-05-27 LAB — TYPE AND SCREEN
ABO/RH(D): O POS
ANTIBODY SCREEN: NEGATIVE
UNIT DIVISION: 0
Unit division: 0

## 2016-05-28 NOTE — Progress Notes (Signed)
Histology and Location of Primary Cancer: Recurrent IgG lambda myeloma   Location(s) of Symptomatic Metastases: Xray of the right hip from 05/16/16 shows "Large lytic lesion seen superior to right acetabulum. "  Past/Anticipated chemotherapy by medical oncology, if any: Kyprolis/Cytoxan - s/p cycle #2. Cytoxan 21m po q wk (3/1)/Ixazomib 455mpo q week (3/1) - s/p cycle #4 - progression on 04/05/2016. Zometa 3.3 mg IV q. 4 weeks.  Pain on a scale of 0-10 is: 2 in his right hip/upper leg   If Spine Met(s), symptoms, if any, include:  Bowel/Bladder retention or incontinence (please describe): reports having constipation, last bm yesterday, he reports bowel movements have not been normal for about a week.  He is concerned about worsening his hernia.  Numbness or weakness in extremities (please describe): no  Current Decadron regimen, if applicable: n/a  Ambulatory status? Walker? Wheelchair?: ambulatory  SAFETY ISSUES:  Prior radiation?06/17/13-07/06/13 35 gray to upper lumbar spine, 09/26/15-10/20/15 lower thoracic spine, upper lumbar spine 30 gray   Pacemaker/ICD? no  Possible current pregnancy? no  Is the patient on methotrexate? no  Current Complaints / other details:  Patient reports he has been limping.  His bp was high today at 187/90 and 179/92 when retaken.  BP (!) 179/92 (BP Location: Left Arm, Patient Position: Sitting)   Pulse 66   Temp 97.8 F (36.6 C) (Oral)   Ht 5' 11"  (1.803 m)   Wt 142 lb 9.6 oz (64.7 kg)   SpO2 100%   BMI 19.89 kg/m    Wt Readings from Last 3 Encounters:  05/29/16 142 lb 9.6 oz (64.7 kg)  05/16/16 140 lb 12.8 oz (63.9 kg)  04/25/16 145 lb (65.8 kg)

## 2016-05-29 ENCOUNTER — Encounter: Payer: Self-pay | Admitting: Radiation Oncology

## 2016-05-29 ENCOUNTER — Ambulatory Visit
Admission: RE | Admit: 2016-05-29 | Discharge: 2016-05-29 | Disposition: A | Discharge status: Home / Self Care | Payer: Medicare HMO | Source: Ambulatory Visit | Attending: Radiation Oncology | Admitting: Radiation Oncology

## 2016-05-29 VITALS — BP 179/92 | HR 66 | Temp 97.8°F | Ht 71.0 in | Wt 142.6 lb

## 2016-05-29 DIAGNOSIS — C9002 Multiple myeloma in relapse: Secondary | ICD-10-CM

## 2016-05-29 DIAGNOSIS — Z510 Encounter for antineoplastic radiation therapy: Secondary | ICD-10-CM | POA: Insufficient documentation

## 2016-05-29 DIAGNOSIS — C9000 Multiple myeloma not having achieved remission: Secondary | ICD-10-CM | POA: Diagnosis not present

## 2016-05-29 NOTE — Progress Notes (Signed)
Please see the Nurse Progress Note in the MD Initial Consult Encounter for this patient. 

## 2016-05-29 NOTE — Addendum Note (Signed)
Encounter addended by: Jacqulyn Liner, RN on: 05/29/2016  9:56 AM<BR>    Actions taken: Charge Capture section accepted

## 2016-05-29 NOTE — Progress Notes (Signed)
Radiation Oncology         [phone] ________________________________  Name: Christian Burns MRN: [id number]  Date: 05/29/2016  DOB: [birth year]  Re-eval Note   CC: Donnajean Lopes, MD  [name], MD    ICD-9-CM ICD-10-CM   1. Multiple myeloma in relapse [facility]) 203.02 C90.02     Diagnosis:   Recurrent myeloma   Interval Since Last Radiation:  4 1/2 months 09/26/15 - 10/20/15: Lower thoracic spine, upper lumbar spine, 30 gray in 15 fractions  06/17/13 - 07/06/13: Upper lumbar spine, 35 gray in 14 fractions   Narrative:  The patient returns today for routine follow-up.  I originally treated the patient from February 14 through March 10 earlier this year for progressive multiple myeloma. He responded well to treatment, and has been following up with Dr. Marin Olp in medical oncology, and has also been receiving rounds of chemotherapy, and has recently started a Kyprolis/Cytoxan protocol.   After indicating to Dr. Marin Olp that he had right hip pain had had lost some weight, an X-ray performed on 05/16/16 revealed a large lytic lesion seen superior to the right acebatulum.  He is here today to consider his options for radiotherapy.    The patient indicates that his pain originally started in his upper hip, and has since radiated down his right leg. He reports that this pain is exacerbated with physical activity, and walks with a limp. He also reports that he feels a numbing sensation in both legs that are ever present.  The patient denies cough, breathing problems, and reports he has a lot of phlegm.    ALLERGIES:  is allergic to sulfa antibiotics.  Meds: Current Outpatient Prescriptions  Medication Sig Dispense Refill  . aspirin EC 81 MG tablet Take 81 mg by mouth daily.     . Calcium Carb-Cholecalciferol (CALCIUM 600 + D PO) Take by mouth every morning.    . Cyanocobalamin (VITAMIN B 12 PO) Take 1,000 mcg by mouth every morning.     . Multiple Vitamins-Minerals (MULTIVITAL PO)  Take 1 tablet by mouth every morning.     . Pyridoxine HCl (VITAMIN B-6) 250 MG tablet Take 500 mg by mouth daily.    . Vitamin D, Ergocalciferol, (DRISDOL) 50000 UNITS CAPS capsule Take 50,000 Units by mouth every 7 (seven) days. Sundays    . dexamethasone (DECADRON) 4 MG tablet Take 5 pills, WITH FOOD, once a week with your chemotherapy pills. (Patient not taking: Reported on 05/29/2016) 60 tablet 3  . HYDROcodone-acetaminophen (NORCO/VICODIN) 5-325 MG tablet Take 1 tablet by mouth every 6 (six) hours as needed for moderate pain. (Patient not taking: Reported on 05/29/2016) 60 tablet 0  . LORazepam (ATIVAN) 0.5 MG tablet Take 1 tablet (0.5 mg total) by mouth every 6 (six) hours as needed (Nausea or vomiting). (Patient not taking: Reported on 05/29/2016) 30 tablet 0  . ondansetron (ZOFRAN) 8 MG tablet Take 1 tablet (8 mg total) by mouth 2 (two) times daily as needed for refractory nausea / vomiting. Take as needed on days 4-7, 11-14, 18-28. (Patient not taking: Reported on 05/29/2016) 30 tablet 1  . prochlorperazine (COMPAZINE) 10 MG tablet Take 1 tablet (10 mg total) by mouth every 6 (six) hours as needed (Nausea or vomiting). (Patient not taking: Reported on 05/29/2016) 30 tablet 1   No current facility-administered medications for this encounter.     Physical Findings: The patient is in no acute distress. Patient is alert and oriented. General: Alert and oriented, in no  acute distress HEENT: Head is normocephalic. Extraocular movements are intact. Oropharynx is clear. Neck: Neck is supple, no palpable cervical or supraclavicular lymphadenopathy. Heart: Regular in rate and rhythm with no murmurs, rubs, or gallops. Chest: Clear to auscultation bilaterally, with no rhonchi, wheezes, or rales. Abdomen: Soft, nontender, nondistended, with no rigidity or guarding. Extremities: No cyanosis or edema. Lymphatics: see Neck Exam Skin: No concerning lesions. Musculoskeletal: symmetric strength and  muscle tone throughout. Motor strength is 5/5 in lower extremities, proximal and distal  height is 5' 11"  (1.803 m) and weight is 142 lb 9.6 oz (64.7 kg). His oral temperature is 97.8 F (36.6 C). His blood pressure is 179/92 (abnormal) and his pulse is 66. His oxygen saturation is 100%. .    Lab Findings: Lab Results  Component Value Date   WBC 4.3 05/23/2016   HGB 8.3 (L) 05/23/2016   HCT 23.5 (L) 05/23/2016   MCV 96 05/23/2016   PLT 119 (L) 05/23/2016    Radiographic Findings: Dg Cervical Spine Complete  Result Date: 05/16/2016 CLINICAL DATA:  Right-sided neck pain for 2 weeks without known injury. EXAM: CERVICAL SPINE - COMPLETE 4+ VIEW COMPARISON:  None. FINDINGS: No fracture or spondylolisthesis is noted. Severe degenerative disc disease is noted at C4-5, C5-6 and C6-7 with anterior osteophyte formation. No prevertebral soft tissue swelling is noted. Mild bilateral neural foraminal stenosis is noted at C5-6 and C6-7 with uncovertebral spurring. IMPRESSION: Severe multilevel degenerative disc disease. No acute abnormality seen in the cervical spine. Electronically Signed   By: Marijo Conception, M.D.   On: 05/16/2016 14:18   Dg Lumbar Spine Complete  Result Date: 05/16/2016 CLINICAL DATA:  Right hip pain and weakness for several months without known injury. EXAM: LUMBAR SPINE - COMPLETE 4+ VIEW COMPARISON:  MRI of September 13, 2015. FINDINGS: Moderate levoscoliosis of lower thoracic and upper lumbar spine is noted. No fracture or spondylolisthesis is noted. Status post kyphoplasty at L2 vertebral body. Severe degenerative disc disease is noted at L2-3 and L3-4. Mild degenerative disc disease is noted at L1-2 and L4-5. IMPRESSION: Multilevel degenerative disc disease. No acute abnormality seen in the lumbar spine. Electronically Signed   By: Marijo Conception, M.D.   On: 05/16/2016 14:21   Dg Hip Unilat W Or W/o Pelvis 1 View Right  Result Date: 05/16/2016 CLINICAL DATA:  Right hip pain for  several months without known injury. History of multiple myeloma. EXAM: DG HIP (WITH OR WITHOUT PELVIS) 1V RIGHT COMPARISON:  CT scan of June 08, 2013. FINDINGS: There is no evidence of hip fracture or dislocation. There is no evidence of arthropathy. However, large lytic lesion is seen superior to acetabulum consistent with multiple myeloma. IMPRESSION: Large lytic lesion seen superior to right acetabulum consistent with multiple myeloma. These results will be called to the ordering clinician or representative by the Radiologist Assistant, and communication documented in the PACS or zVision Dashboard. Electronically Signed   By: Marijo Conception, M.D.   On: 05/16/2016 14:25    Impression:  The patient is an 82 year old caucasian man with recurrent metastatic myeloma.  He has a large lytic lesion superior to acetabulum and is symptomatic from this lesion.  He would be a good candidate for palliative radiation therapy to this area.  Plan:    He indicated that he would like to follow through with this treatment, and will  have CT SIM planning on Tuesday of next week. Anticipate 10 treatments.  ____________________________________   This  document serves as a record of services personally performed by Gery Pray, MD. It was created on his behalf by Truddie Hidden, a trained medical scribe. The creation of this record is based on the scribe's personal observations and the provider's statements to them. This document has been checked and approved by the attending provider.

## 2016-05-30 ENCOUNTER — Encounter: Payer: Self-pay | Admitting: Hematology & Oncology

## 2016-05-30 ENCOUNTER — Ambulatory Visit (HOSPITAL_BASED_OUTPATIENT_CLINIC_OR_DEPARTMENT_OTHER): Payer: Medicare HMO

## 2016-05-30 ENCOUNTER — Other Ambulatory Visit (HOSPITAL_BASED_OUTPATIENT_CLINIC_OR_DEPARTMENT_OTHER): Payer: Medicare HMO

## 2016-05-30 ENCOUNTER — Ambulatory Visit (HOSPITAL_BASED_OUTPATIENT_CLINIC_OR_DEPARTMENT_OTHER): Payer: Medicare HMO | Admitting: Hematology & Oncology

## 2016-05-30 VITALS — BP 158/73 | HR 68 | Temp 97.5°F | Resp 16 | Ht 71.0 in | Wt 140.0 lb

## 2016-05-30 DIAGNOSIS — C9002 Multiple myeloma in relapse: Secondary | ICD-10-CM

## 2016-05-30 DIAGNOSIS — C9000 Multiple myeloma not having achieved remission: Secondary | ICD-10-CM

## 2016-05-30 DIAGNOSIS — Z5112 Encounter for antineoplastic immunotherapy: Secondary | ICD-10-CM | POA: Diagnosis not present

## 2016-05-30 LAB — CMP (CANCER CENTER ONLY)
ALBUMIN: 2.7 g/dL — AB (ref 3.3–5.5)
ALT(SGPT): 21 U/L (ref 10–47)
AST: 22 U/L (ref 11–38)
Alkaline Phosphatase: 130 U/L — ABNORMAL HIGH (ref 26–84)
BUN, Bld: 27 mg/dL — ABNORMAL HIGH (ref 7–22)
CHLORIDE: 109 meq/L — AB (ref 98–108)
CO2: 23 meq/L (ref 18–33)
Calcium: 7.6 mg/dL — ABNORMAL LOW (ref 8.0–10.3)
Creat: 1.8 mg/dl — ABNORMAL HIGH (ref 0.6–1.2)
Glucose, Bld: 98 mg/dL (ref 73–118)
POTASSIUM: 4.4 meq/L (ref 3.3–4.7)
Sodium: 140 mEq/L (ref 128–145)
TOTAL PROTEIN: 6.1 g/dL — AB (ref 6.4–8.1)
Total Bilirubin: 1.1 mg/dl (ref 0.20–1.60)

## 2016-05-30 LAB — CBC WITH DIFFERENTIAL (CANCER CENTER ONLY)
BASO#: 0 10*3/uL (ref 0.0–0.2)
BASO%: 0.2 % (ref 0.0–2.0)
EOS ABS: 0.2 10*3/uL (ref 0.0–0.5)
EOS%: 3.6 % (ref 0.0–7.0)
HCT: 28.7 % — ABNORMAL LOW (ref 38.7–49.9)
HEMOGLOBIN: 10 g/dL — AB (ref 13.0–17.1)
LYMPH#: 0.2 10*3/uL — ABNORMAL LOW (ref 0.9–3.3)
LYMPH%: 4.8 % — AB (ref 14.0–48.0)
MCH: 32.5 pg (ref 28.0–33.4)
MCHC: 34.8 g/dL (ref 32.0–35.9)
MCV: 93 fL (ref 82–98)
MONO#: 0.6 10*3/uL (ref 0.1–0.9)
MONO%: 13.2 % — AB (ref 0.0–13.0)
NEUT#: 3.3 10*3/uL (ref 1.5–6.5)
NEUT%: 78.2 % (ref 40.0–80.0)
PLATELETS: 97 10*3/uL — AB (ref 145–400)
RBC: 3.08 10*6/uL — AB (ref 4.20–5.70)
RDW: 14.9 % (ref 11.1–15.7)
WBC: 4.2 10*3/uL (ref 4.0–10.0)

## 2016-05-30 MED ORDER — DEXAMETHASONE SODIUM PHOSPHATE 10 MG/ML IJ SOLN
10.0000 mg | Freq: Once | INTRAMUSCULAR | Status: AC
  Administered 2016-05-30: 10 mg via INTRAVENOUS

## 2016-05-30 MED ORDER — DEXAMETHASONE SODIUM PHOSPHATE 10 MG/ML IJ SOLN
INTRAMUSCULAR | Status: AC
  Filled 2016-05-30: qty 1

## 2016-05-30 MED ORDER — SODIUM CHLORIDE 0.9 % IV SOLN
Freq: Once | INTRAVENOUS | Status: AC
  Administered 2016-05-30: 12:00:00 via INTRAVENOUS

## 2016-05-30 MED ORDER — ZOLEDRONIC ACID 4 MG/5ML IV CONC
3.3000 mg | Freq: Once | INTRAVENOUS | Status: AC
  Administered 2016-05-30: 3.3 mg via INTRAVENOUS
  Filled 2016-05-30: qty 4.13

## 2016-05-30 MED ORDER — ZOLEDRONIC ACID 4 MG/100ML IV SOLN: 4.0000 mg | Freq: Once | INTRAVENOUS | Status: DC

## 2016-05-30 MED ORDER — DEXTROSE 5 % IV SOLN
33.1000 mg/m2 | Freq: Once | INTRAVENOUS | Status: AC
  Administered 2016-05-30: 60 mg via INTRAVENOUS
  Filled 2016-05-30: qty 30

## 2016-05-30 MED ORDER — SODIUM CHLORIDE 0.9 % IV SOLN: Freq: Once | INTRAVENOUS | Status: DC

## 2016-05-30 NOTE — Progress Notes (Signed)
Hematology and Oncology Follow Up Visit  AADISH TOWNSEND [id number] 04/24/1930 82 y.o. 05/30/2016   Principle Diagnosis:   Recurrent IgG lambda myeloma - progressive  Hypercalcemia of malignancy.  Current Therapy:        Kyprolis/Cytoxan - s/p cycle #2 - Cytoxan dropped  Cytoxan 250mg  po q wk (3/1)/Ixazomib 4mg  po q week (3/1) - s/p cycle #4 - progression on 04/05/2016  Zometa 3.3 mg IV q. 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  Christian Burns is back for followup. he actually looks quite good. He has tolerated his chemotherapy well. His myeloma numbers have come down incredibly well. His M spike is now down to 1.2 g/dL. When we started, it was 3.3 g/dL. His IgG level has come down to 1668 milligrams per deciliter.   He has started radiation therapy. He is getting palliative radiation to the right iliac area. He has 10 treatments.   He got transfused last week. He had 2 units of blood.   I think we can probably drop the Cytoxan. This might be causing some of the myelosuppression. I think that it would be reasonable to stop the Cytoxan.   There is no nausea or vomiting. He does not have much of an appetite. He's had no bleeding. He has had no diarrhea. There's been no cough or shortness of breath.   Overall, his performance status is ECOG 2.   Medications:  Current Outpatient Prescriptions:  .  aspirin EC 81 MG tablet, Take 81 mg by mouth daily. , Disp: , Rfl:  .  Calcium Carb-Cholecalciferol (CALCIUM 600 + D PO), Take by mouth every morning., Disp: , Rfl:  .  Cyanocobalamin (VITAMIN B 12 PO), Take 1,000 mcg by mouth every morning. , Disp: , Rfl:  .  HYDROcodone-acetaminophen (NORCO/VICODIN) 5-325 MG tablet, Take 1 tablet by mouth every 6 (six) hours as needed for moderate pain., Disp: 60 tablet, Rfl: 0 .  LORazepam (ATIVAN) 0.5 MG tablet, Take 1 tablet (0.5 mg total) by mouth every 6 (six) hours  as needed (Nausea or vomiting)., Disp: 30 tablet, Rfl: 0 .  Multiple Vitamins-Minerals (MULTIVITAL PO), Take 1 tablet by mouth every morning. , Disp: , Rfl:  .  ondansetron (ZOFRAN) 8 MG tablet, Take 1 tablet (8 mg total) by mouth 2 (two) times daily as needed for refractory nausea / vomiting. Take as needed on days 4-7, 11-14, 18-28., Disp: 30 tablet, Rfl: 1 .  prochlorperazine (COMPAZINE) 10 MG tablet, Take 1 tablet (10 mg total) by mouth every 6 (six) hours as needed (Nausea or vomiting)., Disp: 30 tablet, Rfl: 1 .  Pyridoxine HCl (VITAMIN B-6) 250 MG tablet, Take 500 mg by mouth daily., Disp: , Rfl:  .  Vitamin D, Ergocalciferol, (DRISDOL) 50000 UNITS CAPS capsule, Take 50,000 Units by mouth every 7 (seven) days. Sundays, Disp: , Rfl:  No current facility-administered medications for this visit.   Facility-Administered Medications Ordered in Other Visits:  .  0.9 %  sodium chloride infusion, , Intravenous, Once, [name], MD .  0.9 %  sodium chloride infusion, , Intravenous, Once, [name], MD .  carfilzomib (KYPROLIS) 58 mg in dextrose 5 % 50 mL chemo infusion, 32 mg/m2 (Treatment Plan Recorded), Intravenous, Once, [name], MD .  dexamethasone (DECADRON) injection 10 mg, 10 mg, Intravenous, Once, [name], MD .  Zoledronic Acid (ZOMETA) IVPB 4 mg, 4  mg, Intravenous, Once, Eliezer Bottom, NP  Allergies:  Allergies  Allergen Reactions  . Sulfa Antibiotics Itching    Past Medical History, Surgical history, Social history, and Family History were reviewed and updated.  Review of Systems: As above  Physical Exam:  height is 5\' 11"  (1.803 m) and weight is 140 lb (63.5 kg). His oral temperature is 97.5 F (36.4 C). His blood pressure is 158/73 (abnormal) and his pulse is 68. His respiration is 16.   Thin, elderly gentleman. He is no distress. Head and neck exam shows no ocular or oral lesions. He has no palpable cervical or supraclavicular lymph nodes.  Lungs are clear bilaterally. Cardiac exam is regular rate and rhythm with no murmurs rubs or bruits. Abdomen is soft. He has good bowel sounds. There is no fluid wave. There is no palpable liver or spleen. Back exam no tenderness over the spine ribs or hips. He has some kyphosis. Extremities shows some age related changes. He has good range of motion and strength. Skin exam shows no rashes. Neurological exam is nonfocal.  Lab Results  Component Value Date   WBC 4.2 05/30/2016   HGB 10.0 (L) 05/30/2016   HCT 28.7 (L) 05/30/2016   MCV 93 05/30/2016   PLT 97 (L) 05/30/2016     Chemistry      Component Value Date/Time   NA 140 05/30/2016 1039   K 4.4 05/30/2016 1039   CL 109 (H) 05/30/2016 1039   CO2 23 05/30/2016 1039   BUN 27 (H) 05/30/2016 1039   CREATININE 1.8 (H) 05/30/2016 1039      Component Value Date/Time   CALCIUM 7.6 (L) 05/30/2016 1039   ALKPHOS 130 (H) 05/30/2016 1039   AST 22 05/30/2016 1039   ALT 21 05/30/2016 1039   BILITOT 1.10 05/30/2016 1039       Impression and Plan: Mr. [name] is an 82 year old gentleman with IgG lambda myeloma.   We are seeing a very nice response. As such, I think we can hold on the Cytoxan for right now. I will let to think that the Kyprolis, by itself, should be able to maintain and improve our response.  Hopefully, we will be able to think about weekly Kyprolis inset of twice a week therapy. I will like to see his labs could down further. I want to see his M spike get down to less than 1.0.   Hopefully, the radiation therapy will help with his right hip.   I'll plan to get him back to see me in another couple weeks.   I spent about 25 minutes with him today.     [name], MD 10/19/201711:39 AM

## 2016-05-30 NOTE — Patient Instructions (Signed)
Carfilzomib injection What is this medicine? CARFILZOMIB (kar FILZ oh mib) targets a specific protein within cancer cells and stops the cancer cells from growing. It is used to treat multiple myeloma. This medicine may be used for other purposes; ask your health care provider or pharmacist if you have questions. What should I tell my health care provider before I take this medicine? They need to know if you have any of these conditions: -heart disease -history of blood clots -irregular heartbeat -kidney disease -liver disease -lung or breathing disease -an unusual or allergic reaction to carfilzomib, or other medicines, foods, dyes, or preservatives -pregnant or trying to get pregnant -breast-feeding How should I use this medicine? This medicine is for injection or infusion into a vein. It is given by a health care professional in a hospital or clinic setting. Talk to your pediatrician regarding the use of this medicine in children. Special care may be needed. Overdosage: If you think you have taken too much of this medicine contact a poison control center or emergency room at once. NOTE: This medicine is only for you. Do not share this medicine with others. What if I miss a dose? It is important not to miss your dose. Call your doctor or health care professional if you are unable to keep an appointment. What may interact with this medicine? Interactions are not expected. Give your health care provider a list of all the medicines, herbs, non-prescription drugs, or dietary supplements you use. Also tell them if you smoke, drink alcohol, or use illegal drugs. Some items may interact with your medicine. This list may not describe all possible interactions. Give your health care provider a list of all the medicines, herbs, non-prescription drugs, or dietary supplements you use. Also tell them if you smoke, drink alcohol, or use illegal drugs. Some items may interact with your medicine. What  should I watch for while using this medicine? Your condition will be monitored carefully while you are receiving this medicine. Report any side effects. Continue your course of treatment even though you feel ill unless your doctor tells you to stop. You may need blood work done while you are taking this medicine. Do not become pregnant while taking this medicine or for at least 30 days after stopping it. Women should inform their doctor if they wish to become pregnant or think they might be pregnant. There is a potential for serious side effects to an unborn child. Men should not father a child while taking this medicine and for 90 days after stopping it. Talk to your health care professional or pharmacist for more information. Do not breast-feed an infant while taking this medicine. Check with your doctor or health care professional if you get an attack of severe diarrhea, nausea and vomiting, or if you sweat a lot. The loss of too much body fluid can make it dangerous for you to take this medicine. You may get dizzy. Do not drive, use machinery, or do anything that needs mental alertness until you know how this medicine affects you. Do not stand or sit up quickly, especially if you are an older patient. This reduces the risk of dizzy or fainting spells. What side effects may I notice from receiving this medicine? Side effects that you should report to your doctor or health care professional as soon as possible: -allergic reactions like skin rash, itching or hives, swelling of the face, lips, or tongue -confusion -dizziness -feeling faint or lightheaded -fever or chills -palpitations -seizures -signs and   symptoms of bleeding such as bloody or black, tarry stools; red or dark-brown urine; spitting up blood or brown material that looks like coffee grounds; red spots on the skin; unusual bruising or bleeding including from the eye, gums, or nose -signs and symptoms of a blood clot such as breathing  problems; changes in vision; chest pain; severe, sudden headache; pain, swelling, warmth in the leg; trouble speaking; sudden numbness or weakness of the face, arm or leg -signs and symptoms of kidney injury like trouble passing urine or change in the amount of urine -signs and symptoms of liver injury like dark yellow or brown urine; general ill feeling or flu-like symptoms; light-colored stools; loss of appetite; nausea; right upper belly pain; unusually weak or tired; yellowing of the eyes or skin Side effects that usually do not require medical attention (report to your doctor or health care professional if they continue or are bothersome): -back pain -cough -diarrhea -headache -muscle cramps -vomiting This list may not describe all possible side effects. Call your doctor for medical advice about side effects. You may report side effects to FDA at 1-800-FDA-1088. Where should I keep my medicine? This drug is given in a hospital or clinic and will not be stored at home. NOTE: This sheet is a summary. It may not cover all possible information. If you have questions about this medicine, talk to your doctor, pharmacist, or health care provider.    2016, Elsevier/Gold Standard. (2015-03-21 16:16:00) Zoledronic Acid injection (Hypercalcemia, Oncology) What is this medicine? ZOLEDRONIC ACID (ZOE le dron ik AS id) lowers the amount of calcium loss from bone. It is used to treat too much calcium in your blood from cancer. It is also used to prevent complications of cancer that has spread to the bone. This medicine may be used for other purposes; ask your health care provider or pharmacist if you have questions. What should I tell my health care provider before I take this medicine? They need to know if you have any of these conditions: -aspirin-sensitive asthma -cancer, especially if you are receiving medicines used to treat cancer -dental disease or wear dentures -infection -kidney  disease -receiving corticosteroids like dexamethasone or prednisone -an unusual or allergic reaction to zoledronic acid, other medicines, foods, dyes, or preservatives -pregnant or trying to get pregnant -breast-feeding How should I use this medicine? This medicine is for infusion into a vein. It is given by a health care professional in a hospital or clinic setting. Talk to your pediatrician regarding the use of this medicine in children. Special care may be needed. Overdosage: If you think you have taken too much of this medicine contact a poison control center or emergency room at once. NOTE: This medicine is only for you. Do not share this medicine with others. What if I miss a dose? It is important not to miss your dose. Call your doctor or health care professional if you are unable to keep an appointment. What may interact with this medicine? -certain antibiotics given by injection -NSAIDs, medicines for pain and inflammation, like ibuprofen or naproxen -some diuretics like bumetanide, furosemide -teriparatide -thalidomide This list may not describe all possible interactions. Give your health care provider a list of all the medicines, herbs, non-prescription drugs, or dietary supplements you use. Also tell them if you smoke, drink alcohol, or use illegal drugs. Some items may interact with your medicine. What should I watch for while using this medicine? Visit your doctor or health care professional for regular checkups. It  may be some time before you see the benefit from this medicine. Do not stop taking your medicine unless your doctor tells you to. Your doctor may order blood tests or other tests to see how you are doing. Women should inform their doctor if they wish to become pregnant or think they might be pregnant. There is a potential for serious side effects to an unborn child. Talk to your health care professional or pharmacist for more information. You should make sure that you  get enough calcium and vitamin D while you are taking this medicine. Discuss the foods you eat and the vitamins you take with your health care professional. Some people who take this medicine have severe bone, joint, and/or muscle pain. This medicine may also increase your risk for jaw problems or a broken thigh bone. Tell your doctor right away if you have severe pain in your jaw, bones, joints, or muscles. Tell your doctor if you have any pain that does not go away or that gets worse. Tell your dentist and dental surgeon that you are taking this medicine. You should not have major dental surgery while on this medicine. See your dentist to have a dental exam and fix any dental problems before starting this medicine. Take good care of your teeth while on this medicine. Make sure you see your dentist for regular follow-up appointments. What side effects may I notice from receiving this medicine? Side effects that you should report to your doctor or health care professional as soon as possible: -allergic reactions like skin rash, itching or hives, swelling of the face, lips, or tongue -anxiety, confusion, or depression -breathing problems -changes in vision -eye pain -feeling faint or lightheaded, falls -jaw pain, especially after dental work -mouth sores -muscle cramps, stiffness, or weakness -redness, blistering, peeling or loosening of the skin, including inside the mouth -trouble passing urine or change in the amount of urine Side effects that usually do not require medical attention (report to your doctor or health care professional if they continue or are bothersome): -bone, joint, or muscle pain -constipation -diarrhea -fever -hair loss -irritation at site where injected -loss of appetite -nausea, vomiting -stomach upset -trouble sleeping -trouble swallowing -weak or tired This list may not describe all possible side effects. Call your doctor for medical advice about side effects.  You may report side effects to FDA at 1-800-FDA-1088. Where should I keep my medicine? This drug is given in a hospital or clinic and will not be stored at home. NOTE: This sheet is a summary. It may not cover all possible information. If you have questions about this medicine, talk to your doctor, pharmacist, or health care provider.    2016, Elsevier/Gold Standard. (2013-12-25 14:19:39)

## 2016-05-31 ENCOUNTER — Telehealth: Payer: Self-pay | Admitting: *Deleted

## 2016-05-31 ENCOUNTER — Ambulatory Visit (HOSPITAL_BASED_OUTPATIENT_CLINIC_OR_DEPARTMENT_OTHER): Payer: Medicare HMO

## 2016-05-31 VITALS — BP 143/82 | HR 75 | Temp 97.8°F | Resp 18

## 2016-05-31 DIAGNOSIS — C9002 Multiple myeloma in relapse: Secondary | ICD-10-CM

## 2016-05-31 DIAGNOSIS — Z5112 Encounter for antineoplastic immunotherapy: Secondary | ICD-10-CM

## 2016-05-31 DIAGNOSIS — C9000 Multiple myeloma not having achieved remission: Secondary | ICD-10-CM

## 2016-05-31 LAB — IGG, IGA, IGM
IGA/IMMUNOGLOBULIN A, SERUM: 56 mg/dL — AB (ref 61–437)
IGM (IMMUNOGLOBIN M), SRM: 19 mg/dL (ref 15–143)
IgG, Qn, Serum: 1201 mg/dL (ref 700–1600)

## 2016-05-31 LAB — KAPPA/LAMBDA LIGHT CHAINS
Ig Kappa Free Light Chain: 9.6 mg/L (ref 3.3–19.4)
Ig Lambda Free Light Chain: 26.4 mg/L — ABNORMAL HIGH (ref 5.7–26.3)
Kappa/Lambda FluidC Ratio: 0.36 (ref 0.26–1.65)

## 2016-05-31 MED ORDER — SODIUM CHLORIDE 0.9 % IV SOLN
Freq: Once | INTRAVENOUS | Status: AC
Start: 2016-05-31 — End: 2016-05-31
  Administered 2016-05-31: 12:00:00 via INTRAVENOUS

## 2016-05-31 MED ORDER — DEXAMETHASONE SODIUM PHOSPHATE 10 MG/ML IJ SOLN
INTRAMUSCULAR | Status: AC
Start: 2016-05-31 — End: 2016-05-31
  Filled 2016-05-31: qty 1

## 2016-05-31 MED ORDER — DEXAMETHASONE SODIUM PHOSPHATE 10 MG/ML IJ SOLN
10.0000 mg | Freq: Once | INTRAMUSCULAR | Status: AC
  Administered 2016-05-31: 10 mg via INTRAVENOUS

## 2016-05-31 MED ORDER — CARFILZOMIB CHEMO INJECTION 60 MG
33.5000 mg/m2 | Freq: Once | INTRAVENOUS | Status: AC
  Administered 2016-05-31: 60 mg via INTRAVENOUS
  Filled 2016-05-31: qty 30

## 2016-05-31 NOTE — Patient Instructions (Signed)
[facility] Discharge Instructions for Patients Receiving Chemotherapy  Today you received the following chemotherapy agents Kyprolis  To help prevent nausea and vomiting after your treatment, we encourage you to take your nausea medication    If you develop nausea and vomiting that is not controlled by your nausea medication, call the clinic.   BELOW ARE SYMPTOMS THAT SHOULD BE REPORTED IMMEDIATELY:  *FEVER GREATER THAN 100.5 F  *CHILLS WITH OR WITHOUT FEVER  NAUSEA AND VOMITING THAT IS NOT CONTROLLED WITH YOUR NAUSEA MEDICATION  *UNUSUAL SHORTNESS OF BREATH  *UNUSUAL BRUISING OR BLEEDING  TENDERNESS IN MOUTH AND THROAT WITH OR WITHOUT PRESENCE OF ULCERS  *URINARY PROBLEMS  *BOWEL PROBLEMS  UNUSUAL RASH Items with * indicate a potential emergency and should be followed up as soon as possible.  Feel free to call the clinic you have any questions or concerns. The clinic phone number is [phone].  Please show the CHEMO ALERT CARD at check-in to the Emergency Department and triage nurse.   

## 2016-05-31 NOTE — Telephone Encounter (Addendum)
error 

## 2016-06-03 LAB — PROTEIN ELECTROPHORESIS, SERUM, WITH REFLEX
A/G Ratio: 1.1 (ref 0.7–1.7)
ALBUMIN: 2.9 g/dL (ref 2.9–4.4)
ALPHA 1: 0.3 g/dL (ref 0.0–0.4)
ALPHA 2: 0.7 g/dL (ref 0.4–1.0)
Beta: 0.7 g/dL (ref 0.7–1.3)
GLOBULIN, TOTAL: 2.6 g/dL (ref 2.2–3.9)
Gamma Globulin: 0.9 g/dL (ref 0.4–1.8)
Interpretation(See Below): 0
M-Spike, %: 0.8 g/dL — ABNORMAL HIGH
PDF SPE: 0
TOTAL PROTEIN: 5.5 g/dL — AB (ref 6.0–8.5)

## 2016-06-04 ENCOUNTER — Ambulatory Visit
Admission: RE | Admit: 2016-06-04 | Discharge: 2016-06-04 | Disposition: A | Discharge status: Home / Self Care | Payer: Medicare HMO | Source: Ambulatory Visit | Attending: Radiation Oncology | Admitting: Radiation Oncology

## 2016-06-04 DIAGNOSIS — C9002 Multiple myeloma in relapse: Secondary | ICD-10-CM

## 2016-06-04 DIAGNOSIS — Z510 Encounter for antineoplastic radiation therapy: Secondary | ICD-10-CM | POA: Diagnosis not present

## 2016-06-04 NOTE — Progress Notes (Signed)
  Radiation Oncology         [phone] ________________________________  Name: Christian Burns MRN: [id number]  Date: 06/04/2016  DOB: May 14, 1930  SIMULATION AND TREATMENT PLANNING NOTE    ICD-9-CM ICD-10-CM   1. Multiple myeloma in relapse ([place]) 203.02 C90.02     DIAGNOSIS:  Recurrent myeloma  NARRATIVE:  The patient was brought to the [facility].  Identity was confirmed.  All relevant records and images related to the planned course of therapy were reviewed.  The patient freely provided informed written consent to proceed with treatment after reviewing the details related to the planned course of therapy. The consent form was witnessed and verified by the simulation staff.  Then, the patient was set-up in a stable reproducible  supine position for radiation therapy.  CT images were obtained.  Surface markings were placed.  The CT images were loaded into the planning software.  Then the target and avoidance structures were contoured.  Treatment planning then occurred.  The radiation prescription was entered and confirmed.  Then, I designed and supervised the construction of a total of 4 medically necessary complex treatment devices.  I have requested : 3D Simulation  I have requested a DVH of the following structures: GTV, PTV, fem head/neck, bowel  I have ordered:dose calc.  PLAN:  The patient will receive 28 Gy in 8 fractions.  -----------------------------------  Blair Promise, PhD, MD   This document serves as a record of services personally performed by Gery Pray, MD. It was created on his behalf by Truddie Hidden, a trained medical scribe. The creation of this record is based on the scribe's personal observations and the provider's statements to them. This document has been checked and approved by the attending provider.

## 2016-06-07 DIAGNOSIS — C9002 Multiple myeloma in relapse: Secondary | ICD-10-CM | POA: Diagnosis not present

## 2016-06-07 DIAGNOSIS — Z510 Encounter for antineoplastic radiation therapy: Secondary | ICD-10-CM | POA: Diagnosis not present

## 2016-06-11 ENCOUNTER — Encounter: Payer: Self-pay | Admitting: Radiation Oncology

## 2016-06-11 ENCOUNTER — Ambulatory Visit
Admission: RE | Admit: 2016-06-11 | Discharge: 2016-06-11 | Disposition: A | Discharge status: Home / Self Care | Payer: Medicare HMO | Source: Ambulatory Visit | Attending: Radiation Oncology | Admitting: Radiation Oncology

## 2016-06-11 VITALS — BP 178/82 | HR 72 | Temp 98.0°F | Resp 18 | Ht 71.0 in | Wt 140.0 lb

## 2016-06-11 DIAGNOSIS — C9002 Multiple myeloma in relapse: Secondary | ICD-10-CM | POA: Diagnosis not present

## 2016-06-11 DIAGNOSIS — Z510 Encounter for antineoplastic radiation therapy: Secondary | ICD-10-CM | POA: Diagnosis not present

## 2016-06-11 NOTE — Progress Notes (Signed)
  Radiation Oncology         [phone] ________________________________  Name: EHREN BERISHA MRN: [id number]  Date: 06/11/2016  DOB: [birth year]  Weekly Radiation Therapy Management    ICD-9-CM ICD-10-CM   1. Multiple myeloma in relapse ([place]) 203.02 C90.02      Current Dose: 3.5 Gy     Planned Dose:  28 Gy  Narrative . . . . . . . . The patient presents for routine under treatment assessment.                                  The patient reports some pain along the right pelvis, resulting in a limp with walking                                 Set-up films were reviewed.                                 The chart was checked. Physical Findings. . .  height is '5\' 11"'$  (1.803 m) and weight is 140 lb (63.5 kg). His oral temperature is 98 F (36.7 C). His blood pressure is 178/82 (abnormal) and his pulse is 72. His respiration is 18 and oxygen saturation is 100%. . Lungs are clear to auscultation bilaterally. Heart has regular rate and rhythm. No palpable cervical, supraclavicular, or axillary adenopathy. Abdomen soft, non-tender, normal bowel sounds. Impression . . . . . . . The patient is tolerating radiation. Plan . . . . . . . . . . . . Continue treatment as planned.  ________________________________   Blair Promise, PhD, MD

## 2016-06-11 NOTE — Progress Notes (Signed)
Christian Burns has received one radiation treatment to his pelvis.  Denies diarrhea,nausea,vomiting,urinary or bladder changes and skin changed.  Nocturia x 2.  Reports fatigue takes a nap in the afternoon. Appetite is fair.  Denies pain.  Receives chemotherapy on Thursday and Friday. Wt Readings from Last 3 Encounters:  06/11/16 140 lb (63.5 kg)  05/30/16 140 lb (63.5 kg)  05/29/16 142 lb 9.6 oz (64.7 kg)  BP (!) 184/88   Pulse 70   Temp 98 F (36.7 C) (Oral)   Resp 18   Ht 5\' 11"  (1.803 m)   Wt 140 lb (63.5 kg)   SpO2 100%   BMI 19.53 kg/m  sitting Right arm BP (!) 178/82   Pulse 72   Temp 98 F (36.7 C) (Oral)   Resp 18   Ht 5\' 11"  (1.803 m)   Wt 140 lb (63.5 kg)   SpO2 100%   BMI 19.53 kg/m  sitting Right arm

## 2016-06-11 NOTE — Progress Notes (Signed)
  Radiation Oncology         [phone] ________________________________  Name: Christian Burns MRN: [id number]  Date: 06/11/2016  DOB: Aug 26, 1929  Simulation Verification Note    ICD-9-CM ICD-10-CM   1. Multiple myeloma in relapse ([place]) 203.02 C90.02     Status: outpatient  NARRATIVE: The patient was brought to the treatment unit and placed in the planned treatment position. The clinical setup was verified. Then port films were obtained and uploaded to the radiation oncology medical record software.  The treatment beams were carefully compared against the planned radiation fields. The position location and shape of the radiation fields was reviewed. They targeted volume of tissue appears to be appropriately covered by the radiation beams. Organs at risk appear to be excluded as planned.  Based on my personal review, I approved the simulation verification. The patient's treatment will proceed as planned.  -----------------------------------  Blair Promise, PhD, MD

## 2016-06-12 ENCOUNTER — Ambulatory Visit
Admission: RE | Admit: 2016-06-12 | Discharge: 2016-06-12 | Disposition: A | Discharge status: Home / Self Care | Payer: Medicare HMO | Source: Ambulatory Visit | Attending: Radiation Oncology | Admitting: Radiation Oncology

## 2016-06-12 DIAGNOSIS — Z510 Encounter for antineoplastic radiation therapy: Secondary | ICD-10-CM | POA: Diagnosis not present

## 2016-06-12 DIAGNOSIS — C9002 Multiple myeloma in relapse: Secondary | ICD-10-CM | POA: Diagnosis not present

## 2016-06-13 ENCOUNTER — Ambulatory Visit (HOSPITAL_BASED_OUTPATIENT_CLINIC_OR_DEPARTMENT_OTHER): Payer: Medicare HMO | Admitting: Family

## 2016-06-13 ENCOUNTER — Other Ambulatory Visit (HOSPITAL_BASED_OUTPATIENT_CLINIC_OR_DEPARTMENT_OTHER): Payer: Medicare HMO

## 2016-06-13 ENCOUNTER — Other Ambulatory Visit: Payer: Self-pay | Admitting: *Deleted

## 2016-06-13 ENCOUNTER — Ambulatory Visit
Admission: RE | Admit: 2016-06-13 | Discharge: 2016-06-13 | Disposition: A | Discharge status: Home / Self Care | Payer: Medicare HMO | Source: Ambulatory Visit | Attending: Radiation Oncology | Admitting: Radiation Oncology

## 2016-06-13 ENCOUNTER — Ambulatory Visit (HOSPITAL_BASED_OUTPATIENT_CLINIC_OR_DEPARTMENT_OTHER): Payer: Medicare HMO

## 2016-06-13 VITALS — BP 154/91 | HR 76 | Temp 97.7°F | Resp 20 | Wt 140.1 lb

## 2016-06-13 DIAGNOSIS — D631 Anemia in chronic kidney disease: Secondary | ICD-10-CM

## 2016-06-13 DIAGNOSIS — Z510 Encounter for antineoplastic radiation therapy: Secondary | ICD-10-CM | POA: Diagnosis not present

## 2016-06-13 DIAGNOSIS — Z5111 Encounter for antineoplastic chemotherapy: Secondary | ICD-10-CM

## 2016-06-13 DIAGNOSIS — C9002 Multiple myeloma in relapse: Secondary | ICD-10-CM

## 2016-06-13 DIAGNOSIS — G620 Drug-induced polyneuropathy: Secondary | ICD-10-CM | POA: Diagnosis not present

## 2016-06-13 DIAGNOSIS — C9000 Multiple myeloma not having achieved remission: Secondary | ICD-10-CM

## 2016-06-13 LAB — CBC WITH DIFFERENTIAL (CANCER CENTER ONLY)
BASO#: 0 10*3/uL (ref 0.0–0.2)
BASO%: 0.5 % (ref 0.0–2.0)
EOS ABS: 0.2 10*3/uL (ref 0.0–0.5)
EOS%: 4.2 % (ref 0.0–7.0)
HCT: 26.2 % — ABNORMAL LOW (ref 38.7–49.9)
HGB: 9 g/dL — ABNORMAL LOW (ref 13.0–17.1)
LYMPH#: 0.3 10*3/uL — ABNORMAL LOW (ref 0.9–3.3)
LYMPH%: 6.3 % — AB (ref 14.0–48.0)
MCH: 32.3 pg (ref 28.0–33.4)
MCHC: 34.4 g/dL (ref 32.0–35.9)
MCV: 94 fL (ref 82–98)
MONO#: 0.6 10*3/uL (ref 0.1–0.9)
MONO%: 14.5 % — AB (ref 0.0–13.0)
NEUT#: 3.2 10*3/uL (ref 1.5–6.5)
NEUT%: 74.5 % (ref 40.0–80.0)
PLATELETS: 143 10*3/uL — AB (ref 145–400)
RBC: 2.79 10*6/uL — AB (ref 4.20–5.70)
RDW: 15.2 % (ref 11.1–15.7)
WBC: 4.3 10*3/uL (ref 4.0–10.0)

## 2016-06-13 LAB — CMP (CANCER CENTER ONLY)
ALT(SGPT): 21 U/L (ref 10–47)
AST: 23 U/L (ref 11–38)
Albumin: 2.6 g/dL — ABNORMAL LOW (ref 3.3–5.5)
Alkaline Phosphatase: 114 U/L — ABNORMAL HIGH (ref 26–84)
BUN: 21 mg/dL (ref 7–22)
CO2: 24 meq/L (ref 18–33)
CREATININE: 1.6 mg/dL — AB (ref 0.6–1.2)
Calcium: 8.1 mg/dL (ref 8.0–10.3)
Chloride: 110 mEq/L — ABNORMAL HIGH (ref 98–108)
Glucose, Bld: 117 mg/dL (ref 73–118)
Potassium: 3.9 mEq/L (ref 3.3–4.7)
SODIUM: 141 meq/L (ref 128–145)
TOTAL PROTEIN: 5.6 g/dL — AB (ref 6.4–8.1)
Total Bilirubin: 0.9 mg/dl (ref 0.20–1.60)

## 2016-06-13 MED ORDER — DEXAMETHASONE SODIUM PHOSPHATE 10 MG/ML IJ SOLN
10.0000 mg | Freq: Once | INTRAMUSCULAR | Status: AC
Start: 2016-06-13 — End: 2016-06-13
  Administered 2016-06-13: 10 mg via INTRAVENOUS

## 2016-06-13 MED ORDER — DEXAMETHASONE SODIUM PHOSPHATE 10 MG/ML IJ SOLN
INTRAMUSCULAR | Status: AC
  Filled 2016-06-13: qty 1

## 2016-06-13 MED ORDER — PALONOSETRON HCL INJECTION 0.25 MG/5ML
0.2500 mg | Freq: Once | INTRAVENOUS | Status: AC
  Administered 2016-06-13: 0.25 mg via INTRAVENOUS

## 2016-06-13 MED ORDER — DEXTROSE 5 % IV SOLN
33.0000 mg/m2 | Freq: Once | INTRAVENOUS | Status: AC
  Administered 2016-06-13: 60 mg via INTRAVENOUS
  Filled 2016-06-13: qty 30

## 2016-06-13 MED ORDER — SODIUM CHLORIDE 0.9 % IV SOLN
Freq: Once | INTRAVENOUS | Status: AC
Start: 2016-06-13 — End: 2016-06-13
  Administered 2016-06-13: 12:00:00 via INTRAVENOUS

## 2016-06-13 MED ORDER — PALONOSETRON HCL INJECTION 0.25 MG/5ML
INTRAVENOUS | Status: AC
  Filled 2016-06-13: qty 5

## 2016-06-13 NOTE — Progress Notes (Signed)
Hematology and Oncology Follow Up Visit  TRUEN SCHAUL [id number] 1930/02/04 82 y.o. 06/13/2016   Principle Diagnosis:  Recurrent IgG lambda myeloma - progressive Hypercalcemia of malignancy  Current Therapy:   Kyprolis/Cytoxan - s/p cycle #2 - Cytoxan dropped Cytoxan 250mg  po q wk (3/1)/Ixazomib 4mg  po q week (3/1) - s/p cycle #4 - progression on 04/05/2016 Zometa 3.3 mg IV q. 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 Akkerman is here today for follow-up and treatment. He is doing well and so far has responded nicely to treatment with Kyprolis. His M-spike 2 weeks ago was down to 0.8 g/dL and IgG level was down to 1,201 mg/dL. Levels today are pending.  He is currently receiving palliative radiation to the right iliac. He has completed 3 of 8 fractions. He has some discomfort in the right hip area first thing in the morning but as he moves about throughout the day this improves.   He has noticed some fatigue since starting radiation therapy.  The neuropathy in his feet is unchanged. No swelling or tenderness in his extremities.  No fever, chills, n/v, cough, rash, dizziness, SOB, chest pain, palpitations, abdominal pain or changes in bowel or bladder habits. No bleeding, bruising or petechiae. No lymphadenopathy found on exam.  His appetite is down so he is supplementing some with Ensure between meals. He admits that he needs to drink more fluids. His weight is stable.   Medications:    Medication List       Accurate as of 06/13/16 11:22 AM. Always use your most recent med list.          aspirin EC 81 MG tablet Take 81 mg by mouth daily.   CALCIUM 600 + D PO Take by mouth every morning.   HYDROcodone-acetaminophen 5-325 MG tablet Commonly known as:  NORCO/VICODIN Take 1 tablet by mouth every 6 (six) hours as needed for moderate pain.   LORazepam 0.5 MG tablet Commonly known as:   ATIVAN Take 1 tablet (0.5 mg total) by mouth every 6 (six) hours as needed (Nausea or vomiting).   MULTIVITAL PO Take 1 tablet by mouth every morning.   ondansetron 8 MG tablet Commonly known as:  ZOFRAN Take 1 tablet (8 mg total) by mouth 2 (two) times daily as needed for refractory nausea / vomiting. Take as needed on days 4-7, 11-14, 18-28.   prochlorperazine 10 MG tablet Commonly known as:  COMPAZINE Take 1 tablet (10 mg total) by mouth every 6 (six) hours as needed (Nausea or vomiting).   VITAMIN B 12 PO Take 1,000 mcg by mouth every morning.   vitamin B-6 250 MG tablet Take 500 mg by mouth daily.   Vitamin D (Ergocalciferol) 50000 units Caps capsule Commonly known as:  DRISDOL Take 50,000 Units by mouth every 7 (seven) days. Sundays       Allergies:  Allergies  Allergen Reactions  . Sulfa Antibiotics Itching    Past Medical History, Surgical history, Social history, and Family History were reviewed and updated.  Review of Systems: All other 10 point review of systems is negative.   Physical Exam:  weight is 140 lb 1.9 oz (63.6 kg). His oral temperature is 97.7 F (36.5 C). His blood pressure is 154/91 (abnormal) and his pulse is 76. His respiration is 20.   Wt Readings from Last 3 Encounters:  06/13/16 140 lb 1.9 oz (63.6 kg)  06/11/16 140  lb (63.5 kg)  05/30/16 140 lb (63.5 kg)    Ocular: Sclerae unicteric, pupils equal, round and reactive to light Ear-nose-throat: Oropharynx clear, dentition fair Lymphatic: No cervical supraclavicular or axillary adenopathy Lungs no rales or rhonchi, good excursion bilaterally Heart regular rate and rhythm, no murmur appreciated Abd soft, nontender, positive bowel sounds, no liver or spleen tip palpated on exam, no fluid wave MSK no focal spinal tenderness, no joint edema Neuro: non-focal, well-oriented, appropriate affect Breasts: Deferred  Lab Results  Component Value Date   WBC 4.3 06/13/2016   HGB 9.0 (L)  06/13/2016   HCT 26.2 (L) 06/13/2016   MCV 94 06/13/2016   PLT 143 (L) 06/13/2016   Lab Results  Component Value Date   FERRITIN 136 03/22/2009   Lab Results  Component Value Date   RBC 2.79 (L) 06/13/2016   Lab Results  Component Value Date   KPAFRELGTCHN 3.11 (H) 07/28/2015   LAMBDASER 37.90 (H) 07/28/2015   KAPLAMBRATIO 0.36 05/30/2016   Lab Results  Component Value Date   IGGSERUM 1,201 05/30/2016   IGA 204 07/28/2015   IGMSERUM 19 05/30/2016   Lab Results  Component Value Date   TOTALPROTELP 7.9 07/28/2015   ALBUMINELP 3.7 (L) 07/28/2015   A1GS 0.3 07/28/2015   A2GS 0.7 07/28/2015   BETS 0.4 07/28/2015   BETA2SER 0.3 07/28/2015   GAMS 2.5 (H) 07/28/2015   MSPIKE 0.8 (H) 05/30/2016   SPEI * 07/28/2015     Chemistry      Component Value Date/Time   NA 140 05/30/2016 1039   K 4.4 05/30/2016 1039   CL 109 (H) 05/30/2016 1039   CO2 23 05/30/2016 1039   BUN 27 (H) 05/30/2016 1039   CREATININE 1.8 (H) 05/30/2016 1039      Component Value Date/Time   CALCIUM 7.6 (L) 05/30/2016 1039   ALKPHOS 130 (H) 05/30/2016 1039   AST 22 05/30/2016 1039   ALT 21 05/30/2016 1039   BILITOT 1.10 05/30/2016 1039     Impression and Plan: Mr. Harnage is a very pleasant 82 yo white male with IgG lambda myeloma. He has continued to have a nice response to treatment with Kyprolis. His M-spike is now down to 0.8 with an IgG level of 1,201 mg/dL.  His Hgb is down a little today at 9.0 and MCV of 94. We will add an Erythropoietin level to his lab work.  We will proceed with cycle 3 of treatment today as planned per Dr. Marin Olp. The Cytoxan was omitted. He is receiving radiation to the right iliac area and will receive his third of 8 fractions later today. He has had some fatigue with this. He will continue to supplement with Ensure 2-3 times daily as needed. He is monitoring his weight closely at home.    He has his current treatment and appointment schedule.  He will contact our office  with any questions or concerns. We can certainly see him sooner if need be.   Eliezer Bottom, NP 11/2/201711:22 AM

## 2016-06-13 NOTE — Progress Notes (Signed)
Ok to treat per Dr Marin Olp with creat of 1.6

## 2016-06-13 NOTE — Patient Instructions (Signed)
[facility] Discharge Instructions for Patients Receiving Chemotherapy  Today you received the following chemotherapy agents Kyprolis  To help prevent nausea and vomiting after your treatment, we encourage you to take your nausea medication    If you develop nausea and vomiting that is not controlled by your nausea medication, call the clinic.   BELOW ARE SYMPTOMS THAT SHOULD BE REPORTED IMMEDIATELY:  *FEVER GREATER THAN 100.5 F  *CHILLS WITH OR WITHOUT FEVER  NAUSEA AND VOMITING THAT IS NOT CONTROLLED WITH YOUR NAUSEA MEDICATION  *UNUSUAL SHORTNESS OF BREATH  *UNUSUAL BRUISING OR BLEEDING  TENDERNESS IN MOUTH AND THROAT WITH OR WITHOUT PRESENCE OF ULCERS  *URINARY PROBLEMS  *BOWEL PROBLEMS  UNUSUAL RASH Items with * indicate a potential emergency and should be followed up as soon as possible.  Feel free to call the clinic you have any questions or concerns. The clinic phone number is [phone].  Please show the CHEMO ALERT CARD at check-in to the Emergency Department and triage nurse.   

## 2016-06-14 ENCOUNTER — Ambulatory Visit (HOSPITAL_BASED_OUTPATIENT_CLINIC_OR_DEPARTMENT_OTHER): Payer: Medicare HMO

## 2016-06-14 ENCOUNTER — Ambulatory Visit
Admission: RE | Admit: 2016-06-14 | Discharge: 2016-06-14 | Disposition: A | Discharge status: Home / Self Care | Payer: Medicare HMO | Source: Ambulatory Visit | Attending: Radiation Oncology | Admitting: Radiation Oncology

## 2016-06-14 VITALS — BP 143/74 | HR 75 | Temp 97.6°F | Resp 18

## 2016-06-14 DIAGNOSIS — C9000 Multiple myeloma not having achieved remission: Secondary | ICD-10-CM | POA: Diagnosis not present

## 2016-06-14 DIAGNOSIS — Z510 Encounter for antineoplastic radiation therapy: Secondary | ICD-10-CM | POA: Diagnosis not present

## 2016-06-14 DIAGNOSIS — Z5111 Encounter for antineoplastic chemotherapy: Secondary | ICD-10-CM

## 2016-06-14 DIAGNOSIS — C9002 Multiple myeloma in relapse: Secondary | ICD-10-CM

## 2016-06-14 MED ORDER — DEXAMETHASONE SODIUM PHOSPHATE 10 MG/ML IJ SOLN
10.0000 mg | Freq: Once | INTRAMUSCULAR | Status: AC
  Administered 2016-06-14: 10 mg via INTRAVENOUS

## 2016-06-14 MED ORDER — SODIUM CHLORIDE 0.9 % IV SOLN
Freq: Once | INTRAVENOUS | Status: AC
  Administered 2016-06-14: 11:00:00 via INTRAVENOUS

## 2016-06-14 MED ORDER — DEXTROSE 5 % IV SOLN
33.0000 mg/m2 | Freq: Once | INTRAVENOUS | Status: AC
  Administered 2016-06-14: 60 mg via INTRAVENOUS
  Filled 2016-06-14: qty 30

## 2016-06-14 MED ORDER — HEPARIN SOD (PORK) LOCK FLUSH 100 UNIT/ML IV SOLN
500.0000 [IU] | Freq: Once | INTRAVENOUS | Status: DC | PRN
  Filled 2016-06-14: qty 5

## 2016-06-14 MED ORDER — DEXAMETHASONE SODIUM PHOSPHATE 10 MG/ML IJ SOLN
INTRAMUSCULAR | Status: AC
  Filled 2016-06-14: qty 1

## 2016-06-14 MED ORDER — SODIUM CHLORIDE 0.9 % IV SOLN: Freq: Once | INTRAVENOUS | Status: DC

## 2016-06-14 MED ORDER — SODIUM CHLORIDE 0.9% FLUSH
10.0000 mL | INTRAVENOUS | Status: DC | PRN
  Filled 2016-06-14: qty 10

## 2016-06-14 NOTE — Patient Instructions (Signed)
[facility] Discharge Instructions for Patients Receiving Chemotherapy  Today you received the following chemotherapy agents Kyprolis  To help prevent nausea and vomiting after your treatment, we encourage you to take your nausea medication    If you develop nausea and vomiting that is not controlled by your nausea medication, call the clinic.   BELOW ARE SYMPTOMS THAT SHOULD BE REPORTED IMMEDIATELY:  *FEVER GREATER THAN 100.5 F  *CHILLS WITH OR WITHOUT FEVER  NAUSEA AND VOMITING THAT IS NOT CONTROLLED WITH YOUR NAUSEA MEDICATION  *UNUSUAL SHORTNESS OF BREATH  *UNUSUAL BRUISING OR BLEEDING  TENDERNESS IN MOUTH AND THROAT WITH OR WITHOUT PRESENCE OF ULCERS  *URINARY PROBLEMS  *BOWEL PROBLEMS  UNUSUAL RASH Items with * indicate a potential emergency and should be followed up as soon as possible.  Feel free to call the clinic you have any questions or concerns. The clinic phone number is [phone].  Please show the CHEMO ALERT CARD at check-in to the Emergency Department and triage nurse.   

## 2016-06-15 LAB — ERYTHROPOIETIN: ERYTHROPOIETIN: 8.9 m[IU]/mL (ref 2.6–18.5)

## 2016-06-17 ENCOUNTER — Ambulatory Visit
Admission: RE | Admit: 2016-06-17 | Discharge: 2016-06-17 | Disposition: A | Discharge status: Home / Self Care | Payer: Medicare HMO | Source: Ambulatory Visit | Attending: Radiation Oncology | Admitting: Radiation Oncology

## 2016-06-17 DIAGNOSIS — C9002 Multiple myeloma in relapse: Secondary | ICD-10-CM | POA: Diagnosis not present

## 2016-06-17 DIAGNOSIS — Z510 Encounter for antineoplastic radiation therapy: Secondary | ICD-10-CM | POA: Diagnosis not present

## 2016-06-18 ENCOUNTER — Encounter: Payer: Self-pay | Admitting: Radiation Oncology

## 2016-06-18 ENCOUNTER — Ambulatory Visit
Admission: RE | Admit: 2016-06-18 | Discharge: 2016-06-18 | Disposition: A | Discharge status: Home / Self Care | Payer: Medicare HMO | Source: Ambulatory Visit | Attending: Radiation Oncology | Admitting: Radiation Oncology

## 2016-06-18 VITALS — BP 134/75 | HR 71 | Temp 97.7°F | Resp 18 | Ht 71.0 in | Wt 146.0 lb

## 2016-06-18 DIAGNOSIS — C9002 Multiple myeloma in relapse: Secondary | ICD-10-CM | POA: Diagnosis not present

## 2016-06-18 DIAGNOSIS — Z510 Encounter for antineoplastic radiation therapy: Secondary | ICD-10-CM | POA: Diagnosis not present

## 2016-06-18 NOTE — Progress Notes (Signed)
Christian Burns has received 6 radiation treatment to his pelvis.  Denies nausea,vomiting,urinary or bladder changes and skin changed.  Nocturia x 2.  Reports having diarrhea since yesterday, two stools today.   Reports fatigue takes a nap in the after  afternoon.  Appetite is fair.  Denies pain.  Receives chemotherapy on Thursday and Friday.   Patient teaching done did not want another radiation and you booklet.  See education for documentation. Wt Readings from Last 3 Encounters:  06/18/16 146 lb (66.2 kg)  06/13/16 140 lb 1.9 oz (63.6 kg)  06/11/16 140 lb (63.5 kg)  BP 134/75   Pulse 71   Temp 97.7 F (36.5 C) (Oral)   Resp 18   Ht 5\' 11"  (1.803 m)   Wt 146 lb (66.2 kg)   SpO2 100%   BMI 20.36 kg/m

## 2016-06-18 NOTE — Progress Notes (Signed)
  Radiation Oncology         [phone] ________________________________  Name: Christian Burns MRN: [id number]  Date: 06/18/2016  DOB: 08-02-1930  Weekly Radiation Therapy Management    ICD-9-CM ICD-10-CM   1. Multiple myeloma in relapse ([place]) 203.02 C90.02      Current Dose: 21 Gy     Planned Dose:  28 Gy  Narrative . . . . . . . . The patient presents for routine under treatment assessment.  Christian Burns has received 6 radiation treatment to his pelvis. Denies nausea, vomiting, urinary or bladder changes or skin change. Reports nocturia x 2, diarrhea since yesterday with two stools today, and fatigue. Appetite is fair. Denies pain and states he is not limping as much as he used to. Receives chemotherapy on Thursday and Friday.                                  Set-up films were reviewed.                                 The chart was checked. Physical Findings. . .  height is 5' 11" (1.803 m) and weight is 146 lb (66.2 kg). His oral temperature is 97.7 F (36.5 C). His blood pressure is 134/75 and his pulse is 71. His respiration is 18 and oxygen saturation is 100%. . Lungs are clear to auscultation bilaterally. Heart has regular rate and rhythm. Abdomen soft, non-tender, normal bowel sounds. Impression . . . . . . . The patient is tolerating radiation. Plan . . . . . . . . . . . . Continue treatment as planned.  ________________________________   Blair Promise, PhD, MD

## 2016-06-19 ENCOUNTER — Ambulatory Visit
Admission: RE | Admit: 2016-06-19 | Discharge: 2016-06-19 | Disposition: A | Discharge status: Home / Self Care | Payer: Medicare HMO | Source: Ambulatory Visit | Attending: Radiation Oncology | Admitting: Radiation Oncology

## 2016-06-19 DIAGNOSIS — Z510 Encounter for antineoplastic radiation therapy: Secondary | ICD-10-CM | POA: Diagnosis not present

## 2016-06-19 DIAGNOSIS — C9002 Multiple myeloma in relapse: Secondary | ICD-10-CM | POA: Diagnosis not present

## 2016-06-20 ENCOUNTER — Other Ambulatory Visit (HOSPITAL_BASED_OUTPATIENT_CLINIC_OR_DEPARTMENT_OTHER): Payer: Medicare HMO

## 2016-06-20 ENCOUNTER — Ambulatory Visit (HOSPITAL_BASED_OUTPATIENT_CLINIC_OR_DEPARTMENT_OTHER): Payer: Medicare HMO

## 2016-06-20 ENCOUNTER — Ambulatory Visit
Admission: RE | Admit: 2016-06-20 | Discharge: 2016-06-20 | Disposition: A | Discharge status: Home / Self Care | Payer: Medicare HMO | Source: Ambulatory Visit | Attending: Radiation Oncology | Admitting: Radiation Oncology

## 2016-06-20 VITALS — BP 168/80 | HR 67 | Temp 98.7°F | Resp 18

## 2016-06-20 DIAGNOSIS — Z510 Encounter for antineoplastic radiation therapy: Secondary | ICD-10-CM | POA: Diagnosis not present

## 2016-06-20 DIAGNOSIS — C9002 Multiple myeloma in relapse: Secondary | ICD-10-CM

## 2016-06-20 DIAGNOSIS — Z5112 Encounter for antineoplastic immunotherapy: Secondary | ICD-10-CM

## 2016-06-20 DIAGNOSIS — C9000 Multiple myeloma not having achieved remission: Secondary | ICD-10-CM

## 2016-06-20 LAB — CBC WITH DIFFERENTIAL (CANCER CENTER ONLY)
BASO#: 0 10*3/uL (ref 0.0–0.2)
BASO%: 0.2 % (ref 0.0–2.0)
EOS ABS: 0.2 10*3/uL (ref 0.0–0.5)
EOS%: 3.6 % (ref 0.0–7.0)
HCT: 25.1 % — ABNORMAL LOW (ref 38.7–49.9)
HEMOGLOBIN: 8.7 g/dL — AB (ref 13.0–17.1)
LYMPH#: 0.2 10*3/uL — ABNORMAL LOW (ref 0.9–3.3)
LYMPH%: 5.6 % — ABNORMAL LOW (ref 14.0–48.0)
MCH: 32.7 pg (ref 28.0–33.4)
MCHC: 34.7 g/dL (ref 32.0–35.9)
MCV: 94 fL (ref 82–98)
MONO#: 0.7 10*3/uL (ref 0.1–0.9)
MONO%: 17 % — AB (ref 0.0–13.0)
NEUT#: 3 10*3/uL (ref 1.5–6.5)
NEUT%: 73.6 % (ref 40.0–80.0)
Platelets: 103 10*3/uL — ABNORMAL LOW (ref 145–400)
RBC: 2.66 10*6/uL — ABNORMAL LOW (ref 4.20–5.70)
RDW: 15.2 % (ref 11.1–15.7)
WBC: 4.1 10*3/uL (ref 4.0–10.0)

## 2016-06-20 LAB — CMP (CANCER CENTER ONLY)
ALBUMIN: 2.8 g/dL — AB (ref 3.3–5.5)
ALT(SGPT): 18 U/L (ref 10–47)
AST: 24 U/L (ref 11–38)
Alkaline Phosphatase: 104 U/L — ABNORMAL HIGH (ref 26–84)
BILIRUBIN TOTAL: 1.1 mg/dL (ref 0.20–1.60)
BUN, Bld: 22 mg/dL (ref 7–22)
CALCIUM: 7.8 mg/dL — AB (ref 8.0–10.3)
CHLORIDE: 109 meq/L — AB (ref 98–108)
CO2: 25 meq/L (ref 18–33)
CREATININE: 1.7 mg/dL — AB (ref 0.6–1.2)
GLUCOSE: 97 mg/dL (ref 73–118)
Potassium: 4.2 mEq/L (ref 3.3–4.7)
SODIUM: 139 meq/L (ref 128–145)
Total Protein: 5.7 g/dL — ABNORMAL LOW (ref 6.4–8.1)

## 2016-06-20 LAB — LACTATE DEHYDROGENASE: LDH: 232 U/L (ref 125–245)

## 2016-06-20 MED ORDER — SODIUM CHLORIDE 0.9 % IV SOLN
Freq: Once | INTRAVENOUS | Status: AC
  Administered 2016-06-20: 09:00:00 via INTRAVENOUS

## 2016-06-20 MED ORDER — PALONOSETRON HCL INJECTION 0.25 MG/5ML
0.2500 mg | Freq: Once | INTRAVENOUS | Status: AC
  Administered 2016-06-20: 0.25 mg via INTRAVENOUS

## 2016-06-20 MED ORDER — SODIUM CHLORIDE 0.9 % IV SOLN: Freq: Once | INTRAVENOUS | Status: DC

## 2016-06-20 MED ORDER — DEXAMETHASONE SODIUM PHOSPHATE 10 MG/ML IJ SOLN
10.0000 mg | Freq: Once | INTRAMUSCULAR | Status: AC
  Administered 2016-06-20: 10 mg via INTRAVENOUS

## 2016-06-20 MED ORDER — DEXAMETHASONE SODIUM PHOSPHATE 10 MG/ML IJ SOLN
INTRAMUSCULAR | Status: AC
  Filled 2016-06-20: qty 1

## 2016-06-20 MED ORDER — PALONOSETRON HCL INJECTION 0.25 MG/5ML
INTRAVENOUS | Status: AC
  Filled 2016-06-20: qty 5

## 2016-06-20 MED ORDER — DEXTROSE 5 % IV SOLN
60.0000 mg | Freq: Once | INTRAVENOUS | Status: AC
  Administered 2016-06-20: 60 mg via INTRAVENOUS
  Filled 2016-06-20: qty 30

## 2016-06-20 MED ORDER — DEXTROSE 5 % IV SOLN: 60.0000 mg | Freq: Once | INTRAVENOUS | Status: DC

## 2016-06-20 NOTE — Patient Instructions (Signed)
[facility] Discharge Instructions for Patients Receiving Chemotherapy  Today you received the following chemotherapy agents Kyprolis  To help prevent nausea and vomiting after your treatment, we encourage you to take your nausea medication    If you develop nausea and vomiting that is not controlled by your nausea medication, call the clinic.   BELOW ARE SYMPTOMS THAT SHOULD BE REPORTED IMMEDIATELY:  *FEVER GREATER THAN 100.5 F  *CHILLS WITH OR WITHOUT FEVER  NAUSEA AND VOMITING THAT IS NOT CONTROLLED WITH YOUR NAUSEA MEDICATION  *UNUSUAL SHORTNESS OF BREATH  *UNUSUAL BRUISING OR BLEEDING  TENDERNESS IN MOUTH AND THROAT WITH OR WITHOUT PRESENCE OF ULCERS  *URINARY PROBLEMS  *BOWEL PROBLEMS  UNUSUAL RASH Items with * indicate a potential emergency and should be followed up as soon as possible.  Feel free to call the clinic you have any questions or concerns. The clinic phone number is [phone].  Please show the CHEMO ALERT CARD at check-in to the Emergency Department and triage nurse.   

## 2016-06-21 ENCOUNTER — Ambulatory Visit (HOSPITAL_BASED_OUTPATIENT_CLINIC_OR_DEPARTMENT_OTHER): Payer: Medicare HMO

## 2016-06-21 ENCOUNTER — Ambulatory Visit: Payer: Medicare HMO

## 2016-06-21 VITALS — BP 152/72 | HR 71 | Temp 97.7°F | Resp 18

## 2016-06-21 DIAGNOSIS — Z5112 Encounter for antineoplastic immunotherapy: Secondary | ICD-10-CM

## 2016-06-21 DIAGNOSIS — C9002 Multiple myeloma in relapse: Secondary | ICD-10-CM

## 2016-06-21 DIAGNOSIS — C9000 Multiple myeloma not having achieved remission: Secondary | ICD-10-CM

## 2016-06-21 LAB — KAPPA/LAMBDA LIGHT CHAINS
IG LAMBDA FREE LIGHT CHAIN: 19.5 mg/L (ref 5.7–26.3)
Ig Kappa Free Light Chain: 11.1 mg/L (ref 3.3–19.4)
KAPPA/LAMBDA FLC RATIO: 0.57 (ref 0.26–1.65)

## 2016-06-21 LAB — IGG, IGA, IGM
IGG (IMMUNOGLOBIN G), SERUM: 713 mg/dL (ref 700–1600)
IgA, Qn, Serum: 49 mg/dL — ABNORMAL LOW (ref 61–437)
IgM, Qn, Serum: 15 mg/dL (ref 15–143)

## 2016-06-21 MED ORDER — CARFILZOMIB CHEMO INJECTION 60 MG
32.0000 mg/m2 | Freq: Once | INTRAVENOUS | Status: AC
  Administered 2016-06-21: 58 mg via INTRAVENOUS
  Filled 2016-06-21: qty 29

## 2016-06-21 MED ORDER — SODIUM CHLORIDE 0.9 % IV SOLN
Freq: Once | INTRAVENOUS | Status: AC
  Administered 2016-06-21: 10:00:00 via INTRAVENOUS

## 2016-06-21 MED ORDER — DEXAMETHASONE SODIUM PHOSPHATE 10 MG/ML IJ SOLN
INTRAMUSCULAR | Status: AC
  Filled 2016-06-21: qty 1

## 2016-06-21 MED ORDER — DEXAMETHASONE SODIUM PHOSPHATE 10 MG/ML IJ SOLN
10.0000 mg | Freq: Once | INTRAMUSCULAR | Status: AC
  Administered 2016-06-21: 10 mg via INTRAVENOUS

## 2016-06-21 NOTE — Patient Instructions (Signed)
[facility] Discharge Instructions for Patients Receiving Chemotherapy  Today you received the following chemotherapy agents Kyprolis.  To help prevent nausea and vomiting after your treatment, we encourage you to take your nausea medication as prescribed.   If you develop nausea and vomiting that is not controlled by your nausea medication, call the clinic.   BELOW ARE SYMPTOMS THAT SHOULD BE REPORTED IMMEDIATELY:  *FEVER GREATER THAN 100.5 F  *CHILLS WITH OR WITHOUT FEVER  NAUSEA AND VOMITING THAT IS NOT CONTROLLED WITH YOUR NAUSEA MEDICATION  *UNUSUAL SHORTNESS OF BREATH  *UNUSUAL BRUISING OR BLEEDING  TENDERNESS IN MOUTH AND THROAT WITH OR WITHOUT PRESENCE OF ULCERS  *URINARY PROBLEMS  *BOWEL PROBLEMS  UNUSUAL RASH Items with * indicate a potential emergency and should be followed up as soon as possible.  Feel free to call the clinic you have any questions or concerns. The clinic phone number is [phone].  Please show the CHEMO ALERT CARD at check-in to the Emergency Department and triage nurse.   

## 2016-06-24 ENCOUNTER — Ambulatory Visit: Payer: Medicare HMO

## 2016-06-25 LAB — PROTEIN ELECTROPHORESIS, SERUM, WITH REFLEX
A/G Ratio: 1.1 (ref 0.7–1.7)
ALPHA 1: 0.3 g/dL (ref 0.0–0.4)
ALPHA 2: 0.8 g/dL (ref 0.4–1.0)
Albumin: 2.9 g/dL (ref 2.9–4.4)
BETA: 0.8 g/dL (ref 0.7–1.3)
GAMMA GLOBULIN: 0.8 g/dL (ref 0.4–1.8)
GLOBULIN, TOTAL: 2.6 g/dL (ref 2.2–3.9)
Interpretation(See Below): 0
M-SPIKE, %: 0.6 g/dL — AB
Total Protein: 5.5 g/dL — ABNORMAL LOW (ref 6.0–8.5)

## 2016-06-27 ENCOUNTER — Ambulatory Visit: Payer: Medicare HMO

## 2016-06-27 ENCOUNTER — Ambulatory Visit: Payer: Medicare HMO | Admitting: Hematology & Oncology

## 2016-06-27 ENCOUNTER — Encounter: Payer: Self-pay | Admitting: Radiation Oncology

## 2016-06-27 ENCOUNTER — Other Ambulatory Visit: Payer: Medicare HMO

## 2016-06-27 NOTE — Progress Notes (Signed)
  Radiation Oncology         [phone] ________________________________  Name: Christian Burns MRN: [id number]  Date: 06/27/2016  DOB: 10-Apr-1930  End of Treatment Note  Diagnosis:   Multiple myeloma in relapse [facility])     Indication for treatment:  Palliative       Radiation treatment dates:   06/11/16-06/20/16  Site/dose:  Right pelvis/ 28 Gy in 8 fx  Beams/energy:   3D / 10X, 15 X  Narrative: The patient tolerated radiation treatment relatively well.  Patient denied pain or urinary changes during treatment. He noted diarrhea during treatment. Pain improved during treatment.  Plan: The patient has completed radiation treatment. The patient will return to radiation oncology clinic for routine followup in one month. I advised them to call or return sooner if they have any questions or concerns related to their recovery or treatment.  -----------------------------------  Blair Promise, PhD, MD  This document serves as a record of services personally performed by Gery Pray, MD. It was created on his behalf by Bethann Humble, a trained medical scribe. The creation of this record is based on the scribe's personal observations and the provider's statements to them. This document has been checked and approved by the attending provider.

## 2016-06-28 ENCOUNTER — Other Ambulatory Visit: Payer: Self-pay

## 2016-06-28 ENCOUNTER — Ambulatory Visit: Payer: Medicare HMO

## 2016-06-28 DIAGNOSIS — C9002 Multiple myeloma in relapse: Secondary | ICD-10-CM

## 2016-07-01 ENCOUNTER — Ambulatory Visit (HOSPITAL_COMMUNITY)
Admission: RE | Admit: 2016-07-01 | Discharge: 2016-07-01 | Disposition: A | Discharge status: Home / Self Care | Payer: Medicare HMO | Source: Ambulatory Visit | Attending: Hematology & Oncology | Admitting: Hematology & Oncology

## 2016-07-01 ENCOUNTER — Other Ambulatory Visit (HOSPITAL_BASED_OUTPATIENT_CLINIC_OR_DEPARTMENT_OTHER): Payer: Medicare HMO

## 2016-07-01 ENCOUNTER — Ambulatory Visit (HOSPITAL_BASED_OUTPATIENT_CLINIC_OR_DEPARTMENT_OTHER): Payer: Medicare HMO

## 2016-07-01 VITALS — BP 162/79 | HR 69 | Temp 97.5°F | Resp 17

## 2016-07-01 DIAGNOSIS — C9002 Multiple myeloma in relapse: Secondary | ICD-10-CM | POA: Insufficient documentation

## 2016-07-01 DIAGNOSIS — C9000 Multiple myeloma not having achieved remission: Secondary | ICD-10-CM

## 2016-07-01 DIAGNOSIS — Z5112 Encounter for antineoplastic immunotherapy: Secondary | ICD-10-CM | POA: Diagnosis not present

## 2016-07-01 LAB — CBC WITH DIFFERENTIAL (CANCER CENTER ONLY)
BASO#: 0 10*3/uL (ref 0.0–0.2)
BASO%: 0.7 % (ref 0.0–2.0)
EOS ABS: 0.2 10*3/uL (ref 0.0–0.5)
EOS%: 4.4 % (ref 0.0–7.0)
HEMATOCRIT: 22.7 % — AB (ref 38.7–49.9)
HEMOGLOBIN: 7.9 g/dL — AB (ref 13.0–17.1)
LYMPH#: 0.3 10*3/uL — AB (ref 0.9–3.3)
LYMPH%: 7.8 % — ABNORMAL LOW (ref 14.0–48.0)
MCH: 33.5 pg — AB (ref 28.0–33.4)
MCHC: 34.8 g/dL (ref 32.0–35.9)
MCV: 96 fL (ref 82–98)
MONO#: 0.7 10*3/uL (ref 0.1–0.9)
MONO%: 16.8 % — ABNORMAL HIGH (ref 0.0–13.0)
NEUT#: 2.9 10*3/uL (ref 1.5–6.5)
NEUT%: 70.3 % (ref 40.0–80.0)
Platelets: 127 10*3/uL — ABNORMAL LOW (ref 145–400)
RBC: 2.36 10*6/uL — AB (ref 4.20–5.70)
RDW: 16.5 % — ABNORMAL HIGH (ref 11.1–15.7)
WBC: 4.1 10*3/uL (ref 4.0–10.0)

## 2016-07-01 LAB — CMP (CANCER CENTER ONLY)
ALBUMIN: 2.6 g/dL — AB (ref 3.3–5.5)
ALK PHOS: 93 U/L — AB (ref 26–84)
ALT: 14 U/L (ref 10–47)
AST: 24 U/L (ref 11–38)
BUN: 21 mg/dL (ref 7–22)
CALCIUM: 8.3 mg/dL (ref 8.0–10.3)
CHLORIDE: 112 meq/L — AB (ref 98–108)
CO2: 24 mEq/L (ref 18–33)
Creat: 1.6 mg/dl — ABNORMAL HIGH (ref 0.6–1.2)
Glucose, Bld: 128 mg/dL — ABNORMAL HIGH (ref 73–118)
POTASSIUM: 3.9 meq/L (ref 3.3–4.7)
Sodium: 146 mEq/L — ABNORMAL HIGH (ref 128–145)
TOTAL PROTEIN: 5.4 g/dL — AB (ref 6.4–8.1)
Total Bilirubin: 1 mg/dl (ref 0.20–1.60)

## 2016-07-01 LAB — PREPARE RBC (CROSSMATCH)

## 2016-07-01 MED ORDER — DEXTROSE 5 % IV SOLN
60.0000 mg | Freq: Once | INTRAVENOUS | Status: AC
  Administered 2016-07-01: 60 mg via INTRAVENOUS
  Filled 2016-07-01: qty 30

## 2016-07-01 MED ORDER — DIPHENHYDRAMINE HCL 25 MG PO CAPS
ORAL_CAPSULE | ORAL | Status: AC
  Filled 2016-07-01: qty 1

## 2016-07-01 MED ORDER — ZOLEDRONIC ACID 4 MG/100ML IV SOLN: 4.0000 mg | Freq: Once | INTRAVENOUS | Status: DC

## 2016-07-01 MED ORDER — DEXAMETHASONE SODIUM PHOSPHATE 10 MG/ML IJ SOLN
INTRAMUSCULAR | Status: AC
  Filled 2016-07-01: qty 1

## 2016-07-01 MED ORDER — DIPHENHYDRAMINE HCL 25 MG PO CAPS
25.0000 mg | ORAL_CAPSULE | Freq: Once | ORAL | Status: AC
  Administered 2016-07-01: 25 mg via ORAL

## 2016-07-01 MED ORDER — SODIUM CHLORIDE 0.9 % IV SOLN
Freq: Once | INTRAVENOUS | Status: AC
  Administered 2016-07-01: 09:00:00 via INTRAVENOUS

## 2016-07-01 MED ORDER — DEXAMETHASONE SODIUM PHOSPHATE 10 MG/ML IJ SOLN
10.0000 mg | Freq: Once | INTRAMUSCULAR | Status: AC
  Administered 2016-07-01: 10 mg via INTRAVENOUS

## 2016-07-01 MED ORDER — ZOLEDRONIC ACID 4 MG/5ML IV CONC
3.3000 mg | Freq: Once | INTRAVENOUS | Status: AC
  Administered 2016-07-01: 3.3 mg via INTRAVENOUS
  Filled 2016-07-01: qty 4.13

## 2016-07-01 MED ORDER — ACETAMINOPHEN 325 MG PO TABS
ORAL_TABLET | ORAL | Status: AC
  Filled 2016-07-01: qty 2

## 2016-07-01 MED ORDER — ACETAMINOPHEN 325 MG PO TABS
650.0000 mg | ORAL_TABLET | Freq: Once | ORAL | Status: AC
  Administered 2016-07-01: 650 mg via ORAL

## 2016-07-01 MED ORDER — SODIUM CHLORIDE 0.9 % IV SOLN: 250.0000 mL | Freq: Once | INTRAVENOUS | Status: DC

## 2016-07-01 NOTE — Patient Instructions (Signed)
[facility] Discharge Instructions for Patients Receiving Chemotherapy  Today you received the following chemotherapy agents Kyprolis.  To help prevent nausea and vomiting after your treatment, we encourage you to take your nausea medication.   If you develop nausea and vomiting that is not controlled by your nausea medication, call the clinic.   BELOW ARE SYMPTOMS THAT SHOULD BE REPORTED IMMEDIATELY:  *FEVER GREATER THAN 100.5 F  *CHILLS WITH OR WITHOUT FEVER  NAUSEA AND VOMITING THAT IS NOT CONTROLLED WITH YOUR NAUSEA MEDICATION  *UNUSUAL SHORTNESS OF BREATH  *UNUSUAL BRUISING OR BLEEDING  TENDERNESS IN MOUTH AND THROAT WITH OR WITHOUT PRESENCE OF ULCERS  *URINARY PROBLEMS  *BOWEL PROBLEMS  UNUSUAL RASH Items with * indicate a potential emergency and should be followed up as soon as possible.  Feel free to call the clinic you have any questions or concerns. The clinic phone number is [phone].  Please show the [facility] at check-in to the Emergency Department and triage nurse.   Blood Transfusion , Adult A blood transfusion is a procedure in which you receive donated blood, including plasma, platelets, and red blood cells, through an IV tube. You may need a blood transfusion because of illness, surgery, or injury. The blood may come from a donor. You may also be able to donate blood for yourself (autologous blood donation) before a surgery if you know that you might require a blood transfusion. The blood given in a transfusion is made up of different types of cells. You may receive:  Red blood cells. These carry oxygen to the cells in the body.  White blood cells. These help you fight infections.  Platelets. These help your blood to clot.  Plasma. This is the liquid part of your blood and it helps with fluid imbalances. If you have hemophilia or another clotting disorder, you may also receive other types of blood products. Tell a health care  provider about:  Any allergies you have.  All medicines you are taking, including vitamins, herbs, eye drops, creams, and over-the-counter medicines.  Any problems you or family members have had with anesthetic medicines.  Any blood disorders you have.  Any surgeries you have had.  Any medical conditions you have, including any recent fever or cold symptoms.  Whether you are pregnant or may be pregnant.  Any previous reactions you have had during a blood transfusion. What are the risks? Generally, this is a safe procedure. However, problems may occur, including:  Having an allergic reaction to something in the donated blood. Hives and itching may be symptoms of this type of reaction.  Fever. This may be a reaction to the white blood cells in the transfused blood. Nausea or chest pain may accompany a fever.  Iron overload. This can happen from having many transfusions.  Transfusion-related acute lung injury (TRALI). This is a rare reaction that causes lung damage. The cause is not known.TRALI can occur within hours of a transfusion or several days later.  Sudden (acute) or delayed hemolytic reactions. This happens if your blood does not match the cells in your transfusion. Your body's defense system (immune system) may try to attack the new cells. This complication is rare. The symptoms include fever, chills, nausea, and low back pain or chest pain.  Infection or disease transmission. This is rare. What happens before the procedure?  You will have a blood test to determine your blood type. This is necessary to know what kind of blood your body will  accept and to match it to the donor blood.  If you are going to have a planned surgery, you may be able to do an autologous blood donation. This may be done in case you need to have a transfusion.  If you have had an allergic reaction to a transfusion in the past, you may be given medicine to help prevent a reaction. This medicine may  be given to you by mouth or through an IV tube.  You will have your temperature, blood pressure, and pulse monitored before the transfusion.  Follow instructions from your health care provider about eating and drinking restrictions.  Ask your health care provider about:  Changing or stopping your regular medicines. This is especially important if you are taking diabetes medicines or blood thinners.  Taking medicines such as aspirin and ibuprofen. These medicines can thin your blood. Do not take these medicines before your procedure if your health care provider instructs you not to. What happens during the procedure?  An IV tube will be inserted into one of your veins.  The bag of donated blood will be attached to your IV tube. The blood will then enter through your vein.  Your temperature, blood pressure, and pulse will be monitored regularly during the transfusion. This monitoring is done to detect early signs of a transfusion reaction.  If you have any signs or symptoms of a reaction, your transfusion will be stopped and you may be given medicine.  When the transfusion is complete, your IV tube will be removed.  Pressure may be applied to the IV site for a few minutes.  A bandage (dressing) will be applied. The procedure may vary among health care providers and hospitals. What happens after the procedure?  Your temperature, blood pressure, heart rate, breathing rate, and blood oxygen level will be monitored often.  Your blood may be tested to see how you are responding to the transfusion.  You may be warmed with fluids or blankets to maintain a normal body temperature. Summary  A blood transfusion is a procedure in which you receive donated blood, including plasma, platelets, and red blood cells, through an IV tube.  Your temperature, blood pressure, and pulse will be monitored before, during, and after the transfusion.  Your blood may be tested after the transfusion to see  how your body has responded. This information is not intended to replace advice given to you by your health care provider. Make sure you discuss any questions you have with your health care provider. Document Released: 07/26/2000 Document Revised: 04/25/2016 Document Reviewed: 04/25/2016 Elsevier Interactive Patient Education  2017 [employer].

## 2016-07-02 ENCOUNTER — Ambulatory Visit: Payer: Medicare HMO

## 2016-07-02 ENCOUNTER — Encounter: Payer: Self-pay | Admitting: Hematology & Oncology

## 2016-07-02 LAB — TYPE AND SCREEN
ABO/RH(D): O POS
ANTIBODY SCREEN: NEGATIVE
UNIT DIVISION: 0

## 2016-07-10 ENCOUNTER — Other Ambulatory Visit: Payer: Self-pay | Admitting: *Deleted

## 2016-07-10 DIAGNOSIS — C9002 Multiple myeloma in relapse: Secondary | ICD-10-CM

## 2016-07-11 ENCOUNTER — Ambulatory Visit (HOSPITAL_BASED_OUTPATIENT_CLINIC_OR_DEPARTMENT_OTHER): Payer: Medicare HMO

## 2016-07-11 ENCOUNTER — Other Ambulatory Visit (HOSPITAL_BASED_OUTPATIENT_CLINIC_OR_DEPARTMENT_OTHER): Payer: Medicare HMO

## 2016-07-11 ENCOUNTER — Ambulatory Visit (HOSPITAL_BASED_OUTPATIENT_CLINIC_OR_DEPARTMENT_OTHER): Payer: Medicare HMO | Admitting: Hematology & Oncology

## 2016-07-11 VITALS — BP 181/88 | HR 71 | Temp 97.6°F | Resp 20 | Wt 144.0 lb

## 2016-07-11 DIAGNOSIS — D630 Anemia in neoplastic disease: Secondary | ICD-10-CM | POA: Diagnosis not present

## 2016-07-11 DIAGNOSIS — Z5112 Encounter for antineoplastic immunotherapy: Secondary | ICD-10-CM

## 2016-07-11 DIAGNOSIS — C9002 Multiple myeloma in relapse: Secondary | ICD-10-CM | POA: Diagnosis not present

## 2016-07-11 DIAGNOSIS — C9000 Multiple myeloma not having achieved remission: Secondary | ICD-10-CM

## 2016-07-11 LAB — CBC WITH DIFFERENTIAL (CANCER CENTER ONLY)
BASO#: 0 10*3/uL (ref 0.0–0.2)
BASO%: 1 % (ref 0.0–2.0)
EOS%: 5.8 % (ref 0.0–7.0)
Eosinophils Absolute: 0.2 10*3/uL (ref 0.0–0.5)
HEMATOCRIT: 26.1 % — AB (ref 38.7–49.9)
HEMOGLOBIN: 9 g/dL — AB (ref 13.0–17.1)
LYMPH#: 0.3 10*3/uL — AB (ref 0.9–3.3)
LYMPH%: 7.8 % — ABNORMAL LOW (ref 14.0–48.0)
MCH: 32.8 pg (ref 28.0–33.4)
MCHC: 34.5 g/dL (ref 32.0–35.9)
MCV: 95 fL (ref 82–98)
MONO#: 0.7 10*3/uL (ref 0.1–0.9)
MONO%: 18 % — ABNORMAL HIGH (ref 0.0–13.0)
NEUT%: 67.4 % (ref 40.0–80.0)
NEUTROS ABS: 2.7 10*3/uL (ref 1.5–6.5)
Platelets: 125 10*3/uL — ABNORMAL LOW (ref 145–400)
RBC: 2.74 10*6/uL — ABNORMAL LOW (ref 4.20–5.70)
RDW: 17.4 % — AB (ref 11.1–15.7)
WBC: 4 10*3/uL (ref 4.0–10.0)

## 2016-07-11 LAB — CMP (CANCER CENTER ONLY)
ALK PHOS: 86 U/L — AB (ref 26–84)
ALT: 15 U/L (ref 10–47)
AST: 25 U/L (ref 11–38)
Albumin: 2.8 g/dL — ABNORMAL LOW (ref 3.3–5.5)
BUN, Bld: 19 mg/dL (ref 7–22)
CALCIUM: 8.3 mg/dL (ref 8.0–10.3)
CO2: 24 mEq/L (ref 18–33)
CREATININE: 1.4 mg/dL — AB (ref 0.6–1.2)
Chloride: 109 mEq/L — ABNORMAL HIGH (ref 98–108)
GLUCOSE: 109 mg/dL (ref 73–118)
Potassium: 3.9 mEq/L (ref 3.3–4.7)
SODIUM: 144 meq/L (ref 128–145)
Total Bilirubin: 1.3 mg/dl (ref 0.20–1.60)
Total Protein: 5.5 g/dL — ABNORMAL LOW (ref 6.4–8.1)

## 2016-07-11 MED ORDER — DEXTROSE 5 % IV SOLN
60.0000 mg | Freq: Once | INTRAVENOUS | Status: AC
  Administered 2016-07-11: 60 mg via INTRAVENOUS
  Filled 2016-07-11: qty 30

## 2016-07-11 MED ORDER — SODIUM CHLORIDE 0.9 % IV SOLN
Freq: Once | INTRAVENOUS | Status: AC
  Administered 2016-07-11: 10:00:00 via INTRAVENOUS

## 2016-07-11 MED ORDER — DEXAMETHASONE SODIUM PHOSPHATE 10 MG/ML IJ SOLN
10.0000 mg | Freq: Once | INTRAMUSCULAR | Status: AC
  Administered 2016-07-11: 10 mg via INTRAVENOUS

## 2016-07-11 MED ORDER — SODIUM CHLORIDE 0.9 % IV SOLN
Freq: Once | INTRAVENOUS | Status: AC
  Administered 2016-07-11: 11:00:00 via INTRAVENOUS

## 2016-07-11 MED ORDER — DEXAMETHASONE SODIUM PHOSPHATE 10 MG/ML IJ SOLN
INTRAMUSCULAR | Status: AC
  Filled 2016-07-11: qty 1

## 2016-07-11 NOTE — Progress Notes (Signed)
Hematology and Oncology Follow Up Visit  Christian Burns [id number] [birth year] 82 y.o. 07/11/2016   Principle Diagnosis:   Recurrent IgG lambda myeloma - progressive  Hypercalcemia of malignancy.  Current Therapy:        Kyprolis/Cytoxan - s/p cycle #3 - Cytoxan dropped;Kyprolis q wk  Cytoxan 250mg  po q wk (3/1)/Ixazomib 4mg  po q week (3/1) - s/p cycle #4 - progression on 04/05/2016  Zometa 3.3 mg IV q. 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 Had a very nice Thanksgiving. He ate well. His wife also was able to enjoy Thanksgiving. They were at their son's house.  He has done so well with the Kyprolis/Cytoxan that I now have him only on Kyprolis weekly. I think this will help his blood count. I think this will help his quality of life.  Back in early November, his M spike was 0.6 g/dL. His IgG level was down to 713 mg/dL. His lambda light chain was 2 mg/dL.  Again, his response has been dramatic to say the least.  His right hip is bothering him a little bit. He's had radiation to that hip.  He's had no fever. He's had no cough or shortness of breath.  We have transfused him on occasion. I think he was transfused probably 2 weeks ago. I wanted to make sure his blood count was good for Thanksgiving.   Overall, his performance status is ECOG 1.   Medications:  Current Outpatient Prescriptions:  .  aspirin EC 81 MG tablet, Take 81 mg by mouth daily. , Disp: , Rfl:  .  Calcium Carb-Cholecalciferol (CALCIUM 600 + D PO), Take by mouth every morning., Disp: , Rfl:  .  Cyanocobalamin (VITAMIN B 12 PO), Take 1,000 mcg by mouth every morning. , Disp: , Rfl:  .  HYDROcodone-acetaminophen (NORCO/VICODIN) 5-325 MG tablet, Take 1 tablet by mouth every 6 (six) hours as needed for moderate pain., Disp: 60 tablet, Rfl: 0 .  LORazepam (ATIVAN) 0.5 MG tablet, Take 1 tablet (0.5  mg total) by mouth every 6 (six) hours as needed (Nausea or vomiting)., Disp: 30 tablet, Rfl: 0 .  Multiple Vitamins-Minerals (MULTIVITAL PO), Take 1 tablet by mouth every morning. , Disp: , Rfl:  .  Pyridoxine HCl (VITAMIN B-6) 250 MG tablet, Take 500 mg by mouth daily., Disp: , Rfl:  .  Vitamin D, Ergocalciferol, (DRISDOL) 50000 UNITS CAPS capsule, Take 50,000 Units by mouth every 7 (seven) days. Sundays, Disp: , Rfl:  .  ondansetron (ZOFRAN) 8 MG tablet, Take 1 tablet (8 mg total) by mouth 2 (two) times daily as needed for refractory nausea / vomiting. Take as needed on days 4-7, 11-14, 18-28. (Patient not taking: Reported on 07/11/2016), Disp: 30 tablet, Rfl: 1 .  prochlorperazine (COMPAZINE) 10 MG tablet, Take 1 tablet (10 mg total) by mouth every 6 (six) hours as needed (Nausea or vomiting). (Patient not taking: Reported on 07/11/2016), Disp: 30 tablet, Rfl: 1  Allergies:  Allergies  Allergen Reactions  . Sulfa Antibiotics Itching    Past Medical History, Surgical history, Social history, and Family History were reviewed and updated.  Review of Systems: As above  Physical Exam:  weight is 144 lb (65.3 kg). His oral temperature is 97.6 F (36.4 C). His blood pressure is 181/88 (abnormal) and his pulse is 71. His respiration is 20.   Thin, elderly  gentleman. He is no distress. Head and neck exam shows no ocular or oral lesions. He has no palpable cervical or supraclavicular lymph nodes. Lungs are clear bilaterally. Cardiac exam is regular rate and rhythm with no murmurs rubs or bruits. Abdomen is soft. He has good bowel sounds. There is no fluid wave. There is no palpable liver or spleen. Back exam no tenderness over the spine ribs or hips. He has some kyphosis. Extremities shows some age related changes. He has good range of motion and strength. Skin exam shows no rashes. Neurological exam is nonfocal.  Lab Results  Component Value Date   WBC 4.0 07/11/2016   HGB 9.0 (L) 07/11/2016    HCT 26.1 (L) 07/11/2016   MCV 95 07/11/2016   PLT 125 (L) 07/11/2016     Chemistry      Component Value Date/Time   NA 144 07/11/2016 0844   K 3.9 07/11/2016 0844   CL 109 (H) 07/11/2016 0844   CO2 24 07/11/2016 0844   BUN 19 07/11/2016 0844   CREATININE 1.4 (H) 07/11/2016 0844      Component Value Date/Time   CALCIUM 8.3 07/11/2016 0844   ALKPHOS 86 (H) 07/11/2016 0844   AST 25 07/11/2016 0844   ALT 15 07/11/2016 0844   BILITOT 1.30 07/11/2016 0844       Impression and Plan: Christian Burns is an 82 year old gentleman with IgG lambda myeloma.   We are clearly in a situation where we are not going to sure this. The fact that we are controlling this so well really is fulfilling for me. Christian Burns quality-of-life is doing so well.  Hopefully, the weekly Kyprolis as a single agent will be sufficient. If not, then we can certainly re-add the Cytoxan.   We will go ahead and give him Zometa today. His creatinine is good enough for this.   We will plan to get him back to see [place] in another 3 or 4 weeks. He will come in weekly for his Kyprolis.   For his anemia, we certainly could try Aranesp on him. However, his blood pressures on the high side. We will have to be careful with this. However, if we needed to try to help his blood, Aranesp probably would not be a bad idea. I talked to him about this. He would be in agreement with this.  I spent about 25 minutes with him today.     [name], MD 11/30/20179:37 AM

## 2016-07-11 NOTE — Patient Instructions (Signed)
[facility] Discharge Instructions for Patients Receiving Chemotherapy  Today you received the following chemotherapy agents: Kyprolis   To help prevent nausea and vomiting after your treatment, we encourage you to take your nausea medication as directed.    If you develop nausea and vomiting that is not controlled by your nausea medication, call the clinic.   BELOW ARE SYMPTOMS THAT SHOULD BE REPORTED IMMEDIATELY:  *FEVER GREATER THAN 100.5 F  *CHILLS WITH OR WITHOUT FEVER  NAUSEA AND VOMITING THAT IS NOT CONTROLLED WITH YOUR NAUSEA MEDICATION  *UNUSUAL SHORTNESS OF BREATH  *UNUSUAL BRUISING OR BLEEDING  TENDERNESS IN MOUTH AND THROAT WITH OR WITHOUT PRESENCE OF ULCERS  *URINARY PROBLEMS  *BOWEL PROBLEMS  UNUSUAL RASH Items with * indicate a potential emergency and should be followed up as soon as possible.  Feel free to call the clinic you have any questions or concerns. The clinic phone number is [phone].  Please show the CHEMO ALERT CARD at check-in to the Emergency Department and triage nurse.   

## 2016-07-12 ENCOUNTER — Ambulatory Visit: Payer: Medicare HMO

## 2016-07-15 ENCOUNTER — Ambulatory Visit: Payer: Medicare HMO

## 2016-07-15 ENCOUNTER — Other Ambulatory Visit: Payer: Medicare HMO

## 2016-07-17 ENCOUNTER — Other Ambulatory Visit: Payer: Self-pay

## 2016-07-17 DIAGNOSIS — C9002 Multiple myeloma in relapse: Secondary | ICD-10-CM

## 2016-07-18 ENCOUNTER — Other Ambulatory Visit (HOSPITAL_BASED_OUTPATIENT_CLINIC_OR_DEPARTMENT_OTHER): Payer: Medicare HMO

## 2016-07-18 ENCOUNTER — Ambulatory Visit (HOSPITAL_BASED_OUTPATIENT_CLINIC_OR_DEPARTMENT_OTHER): Payer: Medicare HMO | Admitting: Family

## 2016-07-18 ENCOUNTER — Ambulatory Visit (HOSPITAL_BASED_OUTPATIENT_CLINIC_OR_DEPARTMENT_OTHER): Payer: Medicare HMO

## 2016-07-18 VITALS — BP 178/82 | HR 66 | Temp 97.6°F | Resp 16 | Wt 145.0 lb

## 2016-07-18 DIAGNOSIS — Z5112 Encounter for antineoplastic immunotherapy: Secondary | ICD-10-CM

## 2016-07-18 DIAGNOSIS — C9002 Multiple myeloma in relapse: Secondary | ICD-10-CM

## 2016-07-18 DIAGNOSIS — D508 Other iron deficiency anemias: Secondary | ICD-10-CM

## 2016-07-18 DIAGNOSIS — C9000 Multiple myeloma not having achieved remission: Secondary | ICD-10-CM

## 2016-07-18 LAB — CMP (CANCER CENTER ONLY)
ALT(SGPT): 14 U/L (ref 10–47)
AST: 22 U/L (ref 11–38)
Albumin: 2.9 g/dL — ABNORMAL LOW (ref 3.3–5.5)
Alkaline Phosphatase: 81 U/L (ref 26–84)
BILIRUBIN TOTAL: 1.3 mg/dL (ref 0.20–1.60)
BUN: 22 mg/dL (ref 7–22)
CALCIUM: 8.4 mg/dL (ref 8.0–10.3)
CO2: 27 meq/L (ref 18–33)
CREATININE: 1.4 mg/dL — AB (ref 0.6–1.2)
Chloride: 110 mEq/L — ABNORMAL HIGH (ref 98–108)
GLUCOSE: 118 mg/dL (ref 73–118)
Potassium: 4.1 mEq/L (ref 3.3–4.7)
SODIUM: 144 meq/L (ref 128–145)
Total Protein: 5.5 g/dL — ABNORMAL LOW (ref 6.4–8.1)

## 2016-07-18 LAB — IRON AND TIBC
%SAT: 53 % (ref 20–55)
Iron: 126 ug/dL (ref 42–163)
TIBC: 240 ug/dL (ref 202–409)
UIBC: 114 ug/dL — AB (ref 117–376)

## 2016-07-18 LAB — CBC WITH DIFFERENTIAL (CANCER CENTER ONLY)
BASO#: 0 10*3/uL (ref 0.0–0.2)
BASO%: 0.2 % (ref 0.0–2.0)
EOS%: 5.1 % (ref 0.0–7.0)
Eosinophils Absolute: 0.2 10*3/uL (ref 0.0–0.5)
HCT: 25.5 % — ABNORMAL LOW (ref 38.7–49.9)
HGB: 8.8 g/dL — ABNORMAL LOW (ref 13.0–17.1)
LYMPH#: 0.3 10*3/uL — ABNORMAL LOW (ref 0.9–3.3)
LYMPH%: 6.8 % — AB (ref 14.0–48.0)
MCH: 33.2 pg (ref 28.0–33.4)
MCHC: 34.5 g/dL (ref 32.0–35.9)
MCV: 96 fL (ref 82–98)
MONO#: 0.7 10*3/uL (ref 0.1–0.9)
MONO%: 16.1 % — AB (ref 0.0–13.0)
NEUT#: 3.1 10*3/uL (ref 1.5–6.5)
NEUT%: 71.8 % (ref 40.0–80.0)
PLATELETS: 124 10*3/uL — AB (ref 145–400)
RBC: 2.65 10*6/uL — ABNORMAL LOW (ref 4.20–5.70)
RDW: 16.7 % — AB (ref 11.1–15.7)
WBC: 4.3 10*3/uL (ref 4.0–10.0)

## 2016-07-18 LAB — FERRITIN: Ferritin: 393 ng/ml — ABNORMAL HIGH (ref 22–316)

## 2016-07-18 MED ORDER — DEXAMETHASONE SODIUM PHOSPHATE 10 MG/ML IJ SOLN
10.0000 mg | Freq: Once | INTRAMUSCULAR | Status: AC
  Administered 2016-07-18: 10 mg via INTRAVENOUS

## 2016-07-18 MED ORDER — DEXTROSE 5 % IV SOLN
60.0000 mg | Freq: Once | INTRAVENOUS | Status: AC
  Administered 2016-07-18: 60 mg via INTRAVENOUS
  Filled 2016-07-18: qty 30

## 2016-07-18 MED ORDER — DEXAMETHASONE SODIUM PHOSPHATE 10 MG/ML IJ SOLN
INTRAMUSCULAR | Status: AC
  Filled 2016-07-18: qty 1

## 2016-07-18 MED ORDER — SODIUM CHLORIDE 0.9 % IV SOLN: Freq: Once | INTRAVENOUS | Status: AC

## 2016-07-18 MED ORDER — SODIUM CHLORIDE 0.9 % IV SOLN
Freq: Once | INTRAVENOUS | Status: AC
  Administered 2016-07-18: 11:00:00 via INTRAVENOUS

## 2016-07-18 NOTE — Patient Instructions (Signed)
Carfilzomib injection What is this medicine? CARFILZOMIB (kar FILZ oh mib) targets a specific protein within cancer cells and stops the cancer cells from growing. It is used to treat multiple myeloma. COMMON BRAND NAME(S): KYPROLIS What should I tell my health care provider before I take this medicine? They need to know if you have any of these conditions: -heart disease -history of blood clots -irregular heartbeat -kidney disease -liver disease -lung or breathing disease -an unusual or allergic reaction to carfilzomib, or other medicines, foods, dyes, or preservatives -pregnant or trying to get pregnant -breast-feeding How should I use this medicine? This medicine is for injection or infusion into a vein. It is given by a health care professional in a hospital or clinic setting. Talk to your pediatrician regarding the use of this medicine in children. Special care may be needed. What if I miss a dose? It is important not to miss your dose. Call your doctor or health care professional if you are unable to keep an appointment. What may interact with this medicine? Interactions are not expected. Give your health care provider a list of all the medicines, herbs, non-prescription drugs, or dietary supplements you use. Also tell them if you smoke, drink alcohol, or use illegal drugs. Some items may interact with your medicine. What should I watch for while using this medicine? Your condition will be monitored carefully while you are receiving this medicine. Report any side effects. Continue your course of treatment even though you feel ill unless your doctor tells you to stop. You may need blood work done while you are taking this medicine. Do not become pregnant while taking this medicine or for at least 30 days after stopping it. Women should inform their doctor if they wish to become pregnant or think they might be pregnant. There is a potential for serious side effects to an unborn child.  Men should not father a child while taking this medicine and for 90 days after stopping it. Talk to your health care professional or pharmacist for more information. Do not breast-feed an infant while taking this medicine. Check with your doctor or health care professional if you get an attack of severe diarrhea, nausea and vomiting, or if you sweat a lot. The loss of too much body fluid can make it dangerous for you to take this medicine. You may get dizzy. Do not drive, use machinery, or do anything that needs mental alertness until you know how this medicine affects you. Do not stand or sit up quickly, especially if you are an older patient. This reduces the risk of dizzy or fainting spells. What side effects may I notice from receiving this medicine? Side effects that you should report to your doctor or health care professional as soon as possible: -allergic reactions like skin rash, itching or hives, swelling of the face, lips, or tongue -confusion -dizziness -feeling faint or lightheaded -fever or chills -palpitations -seizures -signs and symptoms of bleeding such as bloody or black, tarry stools; red or dark-brown urine; spitting up blood or brown material that looks like coffee grounds; red spots on the skin; unusual bruising or bleeding including from the eye, gums, or nose -signs and symptoms of a blood clot such as breathing problems; changes in vision; chest pain; severe, sudden headache; pain, swelling, warmth in the leg; trouble speaking; sudden numbness or weakness of the face, arm or leg -signs and symptoms of kidney injury like trouble passing urine or change in the amount of urine -signs and   symptoms of liver injury like dark yellow or brown urine; general ill feeling or flu-like symptoms; light-colored stools; loss of appetite; nausea; right upper belly pain; unusually weak or tired; yellowing of the eyes or skin Side effects that usually do not require medical attention (report to  your doctor or health care professional if they continue or are bothersome): -back pain -cough -diarrhea -headache -muscle cramps -vomiting Where should I keep my medicine? This drug is given in a hospital or clinic and will not be stored at home.  2017 Elsevier/Gold Standard (2015-08-31 13:39:23)  

## 2016-07-18 NOTE — Progress Notes (Signed)
Hematology and Oncology Follow Up Visit  BRONN ALTIDOR [id number] 1930/03/02 82 y.o. 07/18/2016   Principle Diagnosis:  Recurrent IgG lambda myeloma - progressive Hypercalcemia of malignancy  Current Therapy:   Kyprolis/Cytoxan - s/p cycle #3 - Cytoxan dropped/ Kyprolis weekly Cytoxan 250mg  po q wk (3/1)/Ixazomib 4mg  po q week (3/1) - s/p cycle #4 - progression on 04/05/2016 Zometa 3.3 mg IV q. 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 is here today for follow-up and treatment. He is doing well and so far has responded nicely to treatment with Kyprolis.  His M-spike in november was down to 0.6 g/dL and IgG level was down to 713 mg/dL. Levels today are pending.  He is having some mild fatigue. Hgb is stable at 8.8. Her received 1 unit of blood on 11/20 and responded nicely. I have added iron studies to his lab work today and results are pending.  He has completed palliative radiation to the right hip. He is not having any pain at this time.  He does have occasional pain in the right groin due to an inguinal hernia. He would like to eventually have this repaired. Unfortunately, his wife injured her leg and is in a wheelchair for now. He wants to wait until she recuperates and he is not having to help lift her and push her chair.  The neuropathy in his feet is unchanged, hands are a little more tingly. This has not effected his dexterity. No swelling or tenderness in his extremities.  No fever, n/v, cough, rash, dizziness, SOB, chest pain, palpitations, abdominal pain or changes in bowel or bladder habits. No bleeding, bruising or petechiae. No lymphadenopathy found on exam.  His appetite is a little better and he is staying hydrated. He supplement as needed with ensure. His weight is up 1 lb since his last visit.   Medications:    Medication List       Accurate as of 07/18/16 10:03 AM. Always use  your most recent med list.          aspirin EC 81 MG tablet Take 81 mg by mouth daily.   CALCIUM 600 + D PO Take by mouth every morning.   HYDROcodone-acetaminophen 5-325 MG tablet Commonly known as:  NORCO/VICODIN Take 1 tablet by mouth every 6 (six) hours as needed for moderate pain.   LORazepam 0.5 MG tablet Commonly known as:  ATIVAN Take 1 tablet (0.5 mg total) by mouth every 6 (six) hours as needed (Nausea or vomiting).   MULTIVITAL PO Take 1 tablet by mouth every morning.   ondansetron 8 MG tablet Commonly known as:  ZOFRAN Take 1 tablet (8 mg total) by mouth 2 (two) times daily as needed for refractory nausea / vomiting. Take as needed on days 4-7, 11-14, 18-28.   prochlorperazine 10 MG tablet Commonly known as:  COMPAZINE Take 1 tablet (10 mg total) by mouth every 6 (six) hours as needed (Nausea or vomiting).   VITAMIN B 12 PO Take 1,000 mcg by mouth every morning.   vitamin B-6 250 MG tablet Take 500 mg by mouth daily.   Vitamin D (Ergocalciferol) 50000 units Caps capsule Commonly known as:  DRISDOL Take 50,000 Units by mouth every 7 (seven) days. Sundays       Allergies:  Allergies  Allergen Reactions  . Sulfa Antibiotics Itching    Past Medical History, Surgical history, Social history, and Family  History were reviewed and updated.  Review of Systems: All other 10 point review of systems is negative.   Physical Exam:  vitals were not taken for this visit.  Wt Readings from Last 3 Encounters:  07/11/16 144 lb (65.3 kg)  06/18/16 146 lb (66.2 kg)  06/13/16 140 lb 1.9 oz (63.6 kg)    Ocular: Sclerae unicteric, pupils equal, round and reactive to light Ear-nose-throat: Oropharynx clear, dentition fair Lymphatic: No cervical supraclavicular or axillary adenopathy Lungs no rales or rhonchi, good excursion bilaterally Heart regular rate and rhythm, no murmur appreciated Abd soft, nontender, positive bowel sounds, no liver or spleen tip palpated  on exam, no fluid wave MSK no focal spinal tenderness, no joint edema Neuro: non-focal, well-oriented, appropriate affect Breasts: Deferred  Lab Results  Component Value Date   WBC 4.3 07/18/2016   HGB 8.8 (L) 07/18/2016   HCT 25.5 (L) 07/18/2016   MCV 96 07/18/2016   PLT 124 (L) 07/18/2016   Lab Results  Component Value Date   FERRITIN 136 03/22/2009   Lab Results  Component Value Date   RBC 2.65 (L) 07/18/2016   Lab Results  Component Value Date   KPAFRELGTCHN 3.11 (H) 07/28/2015   LAMBDASER 37.90 (H) 07/28/2015   KAPLAMBRATIO 0.57 06/20/2016   Lab Results  Component Value Date   IGGSERUM 713 06/20/2016   IGA 204 07/28/2015   IGMSERUM 15 06/20/2016   Lab Results  Component Value Date   TOTALPROTELP 7.9 07/28/2015   ALBUMINELP 3.7 (L) 07/28/2015   A1GS 0.3 07/28/2015   A2GS 0.7 07/28/2015   BETS 0.4 07/28/2015   BETA2SER 0.3 07/28/2015   GAMS 2.5 (H) 07/28/2015   MSPIKE 0.6 (H) 06/20/2016   SPEI * 07/28/2015     Chemistry      Component Value Date/Time   NA 144 07/11/2016 0844   K 3.9 07/11/2016 0844   CL 109 (H) 07/11/2016 0844   CO2 24 07/11/2016 0844   BUN 19 07/11/2016 0844   CREATININE 1.4 (H) 07/11/2016 0844      Component Value Date/Time   CALCIUM 8.3 07/11/2016 0844   ALKPHOS 86 (H) 07/11/2016 0844   AST 25 07/11/2016 0844   ALT 15 07/11/2016 0844   BILITOT 1.30 07/11/2016 0844     Impression and Plan: Mr. Redeker is a very pleasant 82 yo white male with IgG lambda myeloma. He has continued to have a nice response to treatment with Kyprolis. His M-spike in November was down to 0.6 with an IgG level of 713 mg/dL. He continues to do well and has no complaints at this time. We will proceed with weekly Kyprolis treatment today as planned per Dr. Marin Olp. Cytoxan is omitted. We will see what his iron studies show and bring him in next week for an infusion if needed.  He has his current treatment and appointment schedule.  He will contact our office  with any questions or concerns. We can certainly see him sooner if need be.   Eliezer Bottom, NP 12/7/201710:03 AM

## 2016-07-19 ENCOUNTER — Ambulatory Visit: Payer: Medicare HMO

## 2016-07-19 LAB — IGG, IGA, IGM
IGA/IMMUNOGLOBULIN A, SERUM: 34 mg/dL — AB (ref 61–437)
IGM (IMMUNOGLOBIN M), SRM: 11 mg/dL — AB (ref 15–143)
IgG, Qn, Serum: 502 mg/dL — ABNORMAL LOW (ref 700–1600)

## 2016-07-19 LAB — KAPPA/LAMBDA LIGHT CHAINS
IG KAPPA FREE LIGHT CHAIN: 12.2 mg/L (ref 3.3–19.4)
Ig Lambda Free Light Chain: 18.7 mg/L (ref 5.7–26.3)
KAPPA/LAMBDA FLC RATIO: 0.65 (ref 0.26–1.65)

## 2016-07-19 LAB — RETICULOCYTES: Reticulocyte Count: 2.6 % (ref 0.6–2.6)

## 2016-07-23 LAB — PROTEIN ELECTROPHORESIS, SERUM, WITH REFLEX
A/G Ratio: 1.3 (ref 0.7–1.7)
ALPHA 2: 0.6 g/dL (ref 0.4–1.0)
Albumin: 2.8 g/dL — ABNORMAL LOW (ref 2.9–4.4)
Alpha 1: 0.3 g/dL (ref 0.0–0.4)
BETA: 0.7 g/dL (ref 0.7–1.3)
GAMMA GLOBULIN: 0.5 g/dL (ref 0.4–1.8)
GLOBULIN, TOTAL: 2.1 g/dL — AB (ref 2.2–3.9)
Interpretation(See Below): 0
M-Spike, %: 0.3 g/dL — ABNORMAL HIGH
Total Protein: 4.9 g/dL — ABNORMAL LOW (ref 6.0–8.5)

## 2016-07-25 ENCOUNTER — Other Ambulatory Visit (HOSPITAL_BASED_OUTPATIENT_CLINIC_OR_DEPARTMENT_OTHER): Payer: Medicare HMO

## 2016-07-25 ENCOUNTER — Ambulatory Visit (HOSPITAL_BASED_OUTPATIENT_CLINIC_OR_DEPARTMENT_OTHER): Payer: Medicare HMO

## 2016-07-25 ENCOUNTER — Ambulatory Visit (HOSPITAL_BASED_OUTPATIENT_CLINIC_OR_DEPARTMENT_OTHER): Payer: Medicare HMO | Admitting: Hematology & Oncology

## 2016-07-25 VITALS — BP 182/72 | HR 69 | Temp 97.9°F | Resp 18 | Wt 143.8 lb

## 2016-07-25 DIAGNOSIS — C9000 Multiple myeloma not having achieved remission: Secondary | ICD-10-CM

## 2016-07-25 DIAGNOSIS — M545 Low back pain: Secondary | ICD-10-CM

## 2016-07-25 DIAGNOSIS — Z5112 Encounter for antineoplastic immunotherapy: Secondary | ICD-10-CM | POA: Diagnosis not present

## 2016-07-25 DIAGNOSIS — C9002 Multiple myeloma in relapse: Secondary | ICD-10-CM | POA: Diagnosis not present

## 2016-07-25 DIAGNOSIS — M5441 Lumbago with sciatica, right side: Secondary | ICD-10-CM

## 2016-07-25 LAB — CBC WITH DIFFERENTIAL (CANCER CENTER ONLY)
BASO#: 0 10*3/uL (ref 0.0–0.2)
BASO%: 0.4 % (ref 0.0–2.0)
EOS%: 4.7 % (ref 0.0–7.0)
Eosinophils Absolute: 0.2 10*3/uL (ref 0.0–0.5)
HCT: 25.6 % — ABNORMAL LOW (ref 38.7–49.9)
HEMOGLOBIN: 8.9 g/dL — AB (ref 13.0–17.1)
LYMPH#: 0.3 10*3/uL — ABNORMAL LOW (ref 0.9–3.3)
LYMPH%: 6.9 % — AB (ref 14.0–48.0)
MCH: 33.3 pg (ref 28.0–33.4)
MCHC: 34.8 g/dL (ref 32.0–35.9)
MCV: 96 fL (ref 82–98)
MONO#: 0.7 10*3/uL (ref 0.1–0.9)
MONO%: 14.6 % — AB (ref 0.0–13.0)
NEUT%: 73.4 % (ref 40.0–80.0)
NEUTROS ABS: 3.3 10*3/uL (ref 1.5–6.5)
PLATELETS: 127 10*3/uL — AB (ref 145–400)
RBC: 2.67 10*6/uL — AB (ref 4.20–5.70)
RDW: 16.2 % — ABNORMAL HIGH (ref 11.1–15.7)
WBC: 4.5 10*3/uL (ref 4.0–10.0)

## 2016-07-25 LAB — CMP (CANCER CENTER ONLY)
ALK PHOS: 82 U/L (ref 26–84)
ALT: 14 U/L (ref 10–47)
AST: 25 U/L (ref 11–38)
Albumin: 3 g/dL — ABNORMAL LOW (ref 3.3–5.5)
BILIRUBIN TOTAL: 1.3 mg/dL (ref 0.20–1.60)
BUN: 23 mg/dL — AB (ref 7–22)
CHLORIDE: 110 meq/L — AB (ref 98–108)
CO2: 25 mEq/L (ref 18–33)
CREATININE: 1.6 mg/dL — AB (ref 0.6–1.2)
Calcium: 9 mg/dL (ref 8.0–10.3)
Glucose, Bld: 121 mg/dL — ABNORMAL HIGH (ref 73–118)
Potassium: 4.3 mEq/L (ref 3.3–4.7)
SODIUM: 143 meq/L (ref 128–145)
TOTAL PROTEIN: 5.6 g/dL — AB (ref 6.4–8.1)

## 2016-07-25 MED ORDER — DEXAMETHASONE SODIUM PHOSPHATE 10 MG/ML IJ SOLN
INTRAMUSCULAR | Status: AC
  Filled 2016-07-25: qty 1

## 2016-07-25 MED ORDER — DEXAMETHASONE SODIUM PHOSPHATE 10 MG/ML IJ SOLN
10.0000 mg | Freq: Once | INTRAMUSCULAR | Status: AC
  Administered 2016-07-25: 10 mg via INTRAVENOUS

## 2016-07-25 MED ORDER — DEXTROSE 5 % IV SOLN
33.0000 mg/m2 | Freq: Once | INTRAVENOUS | Status: AC
  Administered 2016-07-25: 60 mg via INTRAVENOUS
  Filled 2016-07-25: qty 30

## 2016-07-25 MED ORDER — SODIUM CHLORIDE 0.9 % IV SOLN
Freq: Once | INTRAVENOUS | Status: AC
  Administered 2016-07-25: 11:00:00 via INTRAVENOUS

## 2016-07-25 NOTE — Patient Instructions (Signed)
[facility] Discharge Instructions for Patients Receiving Chemotherapy  Today you received the following chemotherapy agents Kyprolis  To help prevent nausea and vomiting after your treatment, we encourage you to take your nausea medication    If you develop nausea and vomiting that is not controlled by your nausea medication, call the clinic.   BELOW ARE SYMPTOMS THAT SHOULD BE REPORTED IMMEDIATELY:  *FEVER GREATER THAN 100.5 F  *CHILLS WITH OR WITHOUT FEVER  NAUSEA AND VOMITING THAT IS NOT CONTROLLED WITH YOUR NAUSEA MEDICATION  *UNUSUAL SHORTNESS OF BREATH  *UNUSUAL BRUISING OR BLEEDING  TENDERNESS IN MOUTH AND THROAT WITH OR WITHOUT PRESENCE OF ULCERS  *URINARY PROBLEMS  *BOWEL PROBLEMS  UNUSUAL RASH Items with * indicate a potential emergency and should be followed up as soon as possible.  Feel free to call the clinic you have any questions or concerns. The clinic phone number is [phone].  Please show the CHEMO ALERT CARD at check-in to the Emergency Department and triage nurse.   

## 2016-07-25 NOTE — Progress Notes (Signed)
Ok to treat with bp of 182/72 and creat of 1.6 per Dr. Marin Olp order.

## 2016-07-25 NOTE — Progress Notes (Signed)
Hematology and Oncology Follow Up Visit  NENO ASKIN [id number] 1930-07-17 82 y.o. 07/25/2016   Principle Diagnosis:   Recurrent IgG lambda myeloma - progressive  Hypercalcemia of malignancy.  Current Therapy:        Kyprolis/Cytoxan - s/p cycle #4 - Cytoxan dropped;Kyprolis q wk  Cytoxan 250mg  po q wk (3/1)/Ixazomib 4mg  po q week (3/1) - s/p cycle #4 - progression on 04/05/2016  Zometa 3.3 mg IV q. 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  Schlepp is back for followup. His response to treatment continues to amaze me. His last monoclonal spike was down to 0.3 g/dL.  He has done well with the Kyprolis. We are just doing this weekly. This really is making his life easier. He is able to be with his disabled wife and help her.   He is eating okay. He's worried about not getting weight. I told him that I do not think that that was going to be an issue. His albumin has been creeping up which is a good sign.   He is anemic. We certainly could use Aranesp but blood pressure elevation has been a problem so I would not want to further expose him to any increased risk of cerebrovascular disease.   He is complaining more in the way of lower back discomfort over on the right side. He has had a plasmacytoma at T12 in the past. This is been radiated.   He has some loose stool. He is not incontinent.   He has had no cough or shortness of breath. He has had no rash.   Overall, his performance status is ECOG 1.   Medications:  Current Outpatient Prescriptions:  .  aspirin EC 81 MG tablet, Take 81 mg by mouth daily. , Disp: , Rfl:  .  Calcium Carb-Cholecalciferol (CALCIUM 600 + D PO), Take by mouth every morning., Disp: , Rfl:  .  Cyanocobalamin (VITAMIN B 12 PO), Take 1,000 mcg by mouth every morning. , Disp: , Rfl:  .  HYDROcodone-acetaminophen (NORCO/VICODIN) 5-325 MG tablet, Take 1 tablet by mouth  every 6 (six) hours as needed for moderate pain., Disp: 60 tablet, Rfl: 0 .  LORazepam (ATIVAN) 0.5 MG tablet, Take 1 tablet (0.5 mg total) by mouth every 6 (six) hours as needed (Nausea or vomiting)., Disp: 30 tablet, Rfl: 0 .  Multiple Vitamins-Minerals (MULTIVITAL PO), Take 1 tablet by mouth every morning. , Disp: , Rfl:  .  ondansetron (ZOFRAN) 8 MG tablet, Take 1 tablet (8 mg total) by mouth 2 (two) times daily as needed for refractory nausea / vomiting. Take as needed on days 4-7, 11-14, 18-28., Disp: 30 tablet, Rfl: 1 .  prochlorperazine (COMPAZINE) 10 MG tablet, Take 1 tablet (10 mg total) by mouth every 6 (six) hours as needed (Nausea or vomiting)., Disp: 30 tablet, Rfl: 1 .  Pyridoxine HCl (VITAMIN B-6) 250 MG tablet, Take 500 mg by mouth daily., Disp: , Rfl:  .  Vitamin D, Ergocalciferol, (DRISDOL) 50000 UNITS CAPS capsule, Take 50,000 Units by mouth every 7 (seven) days. Sundays, Disp: , Rfl:  No current facility-administered medications for this visit.   Facility-Administered Medications Ordered in Other Visits:  .  0.9 %  sodium chloride infusion, , Intravenous, Once, [name], MD .  carfilzomib (KYPROLIS) 66 mg in dextrose 5 % 50 mL chemo infusion, 36 mg/m2 (Treatment Plan Recorded), Intravenous, Once, Collier Salina  Oletha Cruel, MD .  dexamethasone (DECADRON) injection 10 mg, 10 mg, Intravenous, Once, [name], MD  Allergies:  Allergies  Allergen Reactions  . Sulfa Antibiotics Itching    Past Medical History, Surgical history, Social history, and Family History were reviewed and updated.  Review of Systems: As above  Physical Exam:  weight is 143 lb 12.8 oz (65.2 kg). His oral temperature is 97.9 F (36.6 C). His blood pressure is 182/72 (abnormal) and his pulse is 69. His respiration is 18 and oxygen saturation is 99%.   Thin, elderly gentleman. He is no distress. Head and neck exam shows no ocular or oral lesions. He has no palpable cervical or supraclavicular lymph  nodes. Lungs are clear bilaterally. Cardiac exam is regular rate and rhythm with no murmurs rubs or bruits. Abdomen is soft. He has good bowel sounds. There is no fluid wave. There is no palpable liver or spleen. Back exam no tenderness over the spine ribs or hips. He has some kyphosis. Extremities shows some age related changes. He has good range of motion and strength. Skin exam shows no rashes. Neurological exam is nonfocal.  Lab Results  Component Value Date   WBC 4.5 07/25/2016   HGB 8.9 (L) 07/25/2016   HCT 25.6 (L) 07/25/2016   MCV 96 07/25/2016   PLT 127 (L) 07/25/2016     Chemistry      Component Value Date/Time   NA 143 07/25/2016 0944   K 4.3 07/25/2016 0944   CL 110 (H) 07/25/2016 0944   CO2 25 07/25/2016 0944   BUN 23 (H) 07/25/2016 0944   CREATININE 1.6 (H) 07/25/2016 0944      Component Value Date/Time   CALCIUM 9.0 07/25/2016 0944   ALKPHOS 82 07/25/2016 0944   AST 25 07/25/2016 0944   ALT 14 07/25/2016 0944   BILITOT 1.30 07/25/2016 0944       Impression and Plan: Mr. Tlatelpa is an 82 year old gentleman with IgG lambda myeloma.   I am absolutely amazed as to how well he is done. His monoclonal spike is now down to 0.3 g/dL. When we first started him on a Kyprolis, his M spike was up to 3.3 g/dL. His IgG level was 4600 mg/dL. It is now down to 502 mg/dL. His light chain has also come down incredibly well.  His lambda light chain has gone from 1200 mg/L down to 19 mg/L.  I think we probably going to have to get an MRI of his lumbar spine. He's having more discomfort. It might be scar tissue. He's had radiation to this area.  I am just glad that he is ending the year on a "high note". He really has responded nicely.  We will try to get the MRI before the end of the year.   [name], MD 12/14/201710:58 AM

## 2016-07-26 ENCOUNTER — Ambulatory Visit: Payer: Medicare HMO

## 2016-07-26 LAB — KAPPA/LAMBDA LIGHT CHAINS
IG KAPPA FREE LIGHT CHAIN: 12.9 mg/L (ref 3.3–19.4)
Ig Lambda Free Light Chain: 18.9 mg/L (ref 5.7–26.3)
Kappa/Lambda FluidC Ratio: 0.68 (ref 0.26–1.65)

## 2016-07-30 LAB — MULTIPLE MYELOMA PANEL, SERUM
ALBUMIN/GLOB SERPL: 1.4 (ref 0.7–1.7)
ALPHA 1: 0.3 g/dL (ref 0.0–0.4)
ALPHA2 GLOB SERPL ELPH-MCNC: 0.6 g/dL (ref 0.4–1.0)
Albumin SerPl Elph-Mcnc: 2.9 g/dL (ref 2.9–4.4)
B-Globulin SerPl Elph-Mcnc: 0.7 g/dL (ref 0.7–1.3)
GAMMA GLOB SERPL ELPH-MCNC: 0.4 g/dL (ref 0.4–1.8)
GLOBULIN, TOTAL: 2.1 g/dL — AB (ref 2.2–3.9)
IGA/IMMUNOGLOBULIN A, SERUM: 62 mg/dL (ref 61–437)
IGG (IMMUNOGLOBIN G), SERUM: 495 mg/dL — AB (ref 700–1600)
IgM, Qn, Serum: 13 mg/dL — ABNORMAL LOW (ref 15–143)
M Protein SerPl Elph-Mcnc: 0.3 g/dL — ABNORMAL HIGH
Total Protein: 5 g/dL — ABNORMAL LOW (ref 6.0–8.5)

## 2016-08-06 ENCOUNTER — Ambulatory Visit (HOSPITAL_COMMUNITY)
Admission: RE | Admit: 2016-08-06 | Discharge: 2016-08-06 | Disposition: A | Discharge status: Home / Self Care | Payer: Medicare HMO | Source: Ambulatory Visit | Attending: Hematology & Oncology | Admitting: Hematology & Oncology

## 2016-08-06 DIAGNOSIS — C9002 Multiple myeloma in relapse: Secondary | ICD-10-CM | POA: Insufficient documentation

## 2016-08-06 DIAGNOSIS — S32020A Wedge compression fracture of second lumbar vertebra, initial encounter for closed fracture: Secondary | ICD-10-CM | POA: Diagnosis not present

## 2016-08-06 DIAGNOSIS — M5441 Lumbago with sciatica, right side: Secondary | ICD-10-CM | POA: Diagnosis present

## 2016-08-06 DIAGNOSIS — M4856XA Collapsed vertebra, not elsewhere classified, lumbar region, initial encounter for fracture: Secondary | ICD-10-CM | POA: Insufficient documentation

## 2016-08-06 DIAGNOSIS — M48061 Spinal stenosis, lumbar region without neurogenic claudication: Secondary | ICD-10-CM | POA: Diagnosis not present

## 2016-08-06 MED ORDER — GADOBENATE DIMEGLUMINE 529 MG/ML IV SOLN
10.0000 mL | Freq: Once | INTRAVENOUS | Status: AC | PRN
  Administered 2016-08-06: 8 mL via INTRAVENOUS

## 2016-08-07 ENCOUNTER — Telehealth: Payer: Self-pay | Admitting: *Deleted

## 2016-08-07 NOTE — Telephone Encounter (Addendum)
Patient is aware of results  ----- Message from [name], MD sent at 08/07/2016  6:33 AM EST ----- Call - the myeloma tumor has shrunken a lot!!  You have a lot of arthritis changes in the lower back!! Christian Burns

## 2016-08-08 ENCOUNTER — Ambulatory Visit (HOSPITAL_BASED_OUTPATIENT_CLINIC_OR_DEPARTMENT_OTHER): Payer: Medicare HMO

## 2016-08-08 ENCOUNTER — Ambulatory Visit (HOSPITAL_BASED_OUTPATIENT_CLINIC_OR_DEPARTMENT_OTHER): Payer: Medicare HMO | Admitting: Hematology & Oncology

## 2016-08-08 ENCOUNTER — Other Ambulatory Visit (HOSPITAL_BASED_OUTPATIENT_CLINIC_OR_DEPARTMENT_OTHER): Payer: Medicare HMO

## 2016-08-08 VITALS — BP 173/71 | HR 70 | Temp 97.8°F | Resp 20 | Wt 143.8 lb

## 2016-08-08 DIAGNOSIS — M5441 Lumbago with sciatica, right side: Secondary | ICD-10-CM

## 2016-08-08 DIAGNOSIS — C9000 Multiple myeloma not having achieved remission: Secondary | ICD-10-CM

## 2016-08-08 DIAGNOSIS — C9002 Multiple myeloma in relapse: Secondary | ICD-10-CM | POA: Diagnosis not present

## 2016-08-08 DIAGNOSIS — Z5112 Encounter for antineoplastic immunotherapy: Secondary | ICD-10-CM

## 2016-08-08 LAB — CBC WITH DIFFERENTIAL (CANCER CENTER ONLY)
BASO#: 0 10*3/uL (ref 0.0–0.2)
BASO%: 0.5 % (ref 0.0–2.0)
EOS%: 3.9 % (ref 0.0–7.0)
Eosinophils Absolute: 0.2 10*3/uL (ref 0.0–0.5)
HEMATOCRIT: 25.3 % — AB (ref 38.7–49.9)
HEMOGLOBIN: 8.7 g/dL — AB (ref 13.0–17.1)
LYMPH#: 0.3 10*3/uL — AB (ref 0.9–3.3)
LYMPH%: 8.6 % — ABNORMAL LOW (ref 14.0–48.0)
MCH: 33.5 pg — ABNORMAL HIGH (ref 28.0–33.4)
MCHC: 34.4 g/dL (ref 32.0–35.9)
MCV: 97 fL (ref 82–98)
MONO#: 0.6 10*3/uL (ref 0.1–0.9)
MONO%: 14.7 % — AB (ref 0.0–13.0)
NEUT#: 2.8 10*3/uL (ref 1.5–6.5)
NEUT%: 72.3 % (ref 40.0–80.0)
Platelets: 179 10*3/uL (ref 145–400)
RBC: 2.6 10*6/uL — ABNORMAL LOW (ref 4.20–5.70)
RDW: 15.3 % (ref 11.1–15.7)
WBC: 3.8 10*3/uL — AB (ref 4.0–10.0)

## 2016-08-08 LAB — CMP (CANCER CENTER ONLY)
ALBUMIN: 3 g/dL — AB (ref 3.3–5.5)
ALK PHOS: 102 U/L — AB (ref 26–84)
ALT: 20 U/L (ref 10–47)
AST: 30 U/L (ref 11–38)
BUN, Bld: 19 mg/dL (ref 7–22)
CALCIUM: 8 mg/dL (ref 8.0–10.3)
CO2: 26 mEq/L (ref 18–33)
CREATININE: 1.5 mg/dL — AB (ref 0.6–1.2)
Chloride: 111 mEq/L — ABNORMAL HIGH (ref 98–108)
GLUCOSE: 92 mg/dL (ref 73–118)
Potassium: 3.9 mEq/L (ref 3.3–4.7)
SODIUM: 147 meq/L — AB (ref 128–145)
Total Bilirubin: 1.2 mg/dl (ref 0.20–1.60)
Total Protein: 5.7 g/dL — ABNORMAL LOW (ref 6.4–8.1)

## 2016-08-08 MED ORDER — SODIUM CHLORIDE 0.9 % IV SOLN
Freq: Once | INTRAVENOUS | Status: AC
  Administered 2016-08-08: 14:00:00 via INTRAVENOUS

## 2016-08-08 MED ORDER — DEXAMETHASONE SODIUM PHOSPHATE 10 MG/ML IJ SOLN
10.0000 mg | Freq: Once | INTRAMUSCULAR | Status: AC
  Administered 2016-08-08: 10 mg via INTRAVENOUS

## 2016-08-08 MED ORDER — DEXTROSE 5 % IV SOLN: 60.0000 mg | Freq: Once | INTRAVENOUS | Status: DC

## 2016-08-08 MED ORDER — DEXAMETHASONE SODIUM PHOSPHATE 10 MG/ML IJ SOLN
INTRAMUSCULAR | Status: AC
  Filled 2016-08-08: qty 1

## 2016-08-08 MED ORDER — DEXTROSE 5 % IV SOLN
60.0000 mg | Freq: Once | INTRAVENOUS | Status: AC
  Administered 2016-08-08: 60 mg via INTRAVENOUS
  Filled 2016-08-08: qty 30

## 2016-08-08 NOTE — Progress Notes (Signed)
Hematology and Oncology Follow Up Visit  Christian Burns [id number] 10/20/1929 82 y.o. 08/08/2016   Principle Diagnosis:   Recurrent IgG lambda myeloma - progressive  Hypercalcemia of malignancy.  Current Therapy:        Kyprolis/Cytoxan - s/p cycle #4 - Cytoxan dropped;Kyprolis q wk  Cytoxan 250mg  po q wk (3/1)/Ixazomib 4mg  po q week (3/1) - s/p cycle #4 - progression on 04/05/2016  Zometa 3.3 mg IV q. 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 His response to treatment continues to amaze me. His last monoclonal spike was down to 0.3 g/dL. his IgG level was down to 495 mg/dL.  He has done well with the Kyprolis. We are just doing this weekly. This really is making his life easier. He is able to be with his disabled wife and help her.   He is eating okay. He's worried about not getting weight. I told him that I do not think that that was going to be an issue. His albumin has been creeping up which is a good sign.   He is anemic. We certainly could use Aranesp but blood pressure elevation has been a problem so I would not want to further expose him to any increased risk of cerebrovascular disease.   We did do an MRI of his lower back. This thankfully showed that the plasmacytoma had shrunken in size. He does have arthritic issues which might because of his back discomfort with right sided radiculopathy. He really is not complaining too much of the back. He does not want to do anything else right now.  He had a very nice Christmas. He and his family were all together. His wife is in a wheelchair. He really does great job taking care of her.     Overall, his performance status is ECOG 1.   Medications:  Current Outpatient Prescriptions:  .  aspirin EC 81 MG tablet, Take 81 mg by mouth daily. , Disp: , Rfl:  .  Calcium Carb-Cholecalciferol (CALCIUM 600 + D PO), Take  by mouth every morning., Disp: , Rfl:  .  Cyanocobalamin (VITAMIN B 12 PO), Take 1,000 mcg by mouth every morning. , Disp: , Rfl:  .  Multiple Vitamins-Minerals (MULTIVITAL PO), Take 1 tablet by mouth every morning. , Disp: , Rfl:  .  Pyridoxine HCl (VITAMIN B-6) 250 MG tablet, Take 500 mg by mouth daily., Disp: , Rfl:  .  Vitamin D, Ergocalciferol, (DRISDOL) 50000 UNITS CAPS capsule, Take 50,000 Units by mouth every 7 (seven) days. Sundays, Disp: , Rfl:  .  HYDROcodone-acetaminophen (NORCO/VICODIN) 5-325 MG tablet, Take 1 tablet by mouth every 6 (six) hours as needed for moderate pain. (Patient not taking: Reported on 08/08/2016), Disp: 60 tablet, Rfl: 0 .  LORazepam (ATIVAN) 0.5 MG tablet, Take 1 tablet (0.5 mg total) by mouth every 6 (six) hours as needed (Nausea or vomiting). (Patient not taking: Reported on 08/08/2016), Disp: 30 tablet, Rfl: 0 .  ondansetron (ZOFRAN) 8 MG tablet, Take 1 tablet (8 mg total) by mouth 2 (two) times daily as needed for refractory nausea / vomiting. Take as needed on days 4-7, 11-14, 18-28. (Patient not taking: Reported on 08/08/2016), Disp: 30 tablet, Rfl: 1 .  prochlorperazine (COMPAZINE) 10 MG tablet, Take 1 tablet (10 mg total) by mouth every 6 (six) hours as needed (Nausea or vomiting). (Patient not taking:  Reported on 08/08/2016), Disp: 30 tablet, Rfl: 1  Allergies:  Allergies  Allergen Reactions  . Sulfa Antibiotics Itching    Past Medical History, Surgical history, Social history, and Family History were reviewed and updated.  Review of Systems: As above  Physical Exam:  weight is 143 lb 12.8 oz (65.2 kg). His oral temperature is 97.8 F (36.6 C). His blood pressure is 173/71 (abnormal) and his pulse is 70. His respiration is 20 and oxygen saturation is 100%.   Thin, elderly gentleman. He is no distress. Head and neck exam shows no ocular or oral lesions. He has no palpable cervical or supraclavicular lymph nodes. Lungs are clear bilaterally. Cardiac  exam is regular rate and rhythm with no murmurs rubs or bruits. Abdomen is soft. He has good bowel sounds. There is no fluid wave. There is no palpable liver or spleen. Back exam no tenderness over the spine ribs or hips. He has some kyphosis. Extremities shows some age related changes. He has good range of motion and strength. Skin exam shows no rashes. Neurological exam is nonfocal.  Lab Results  Component Value Date   WBC 4.5 07/25/2016   HGB 8.9 (L) 07/25/2016   HCT 25.6 (L) 07/25/2016   MCV 96 07/25/2016   PLT 127 (L) 07/25/2016     Chemistry      Component Value Date/Time   NA 143 07/25/2016 0944   K 4.3 07/25/2016 0944   CL 110 (H) 07/25/2016 0944   CO2 25 07/25/2016 0944   BUN 23 (H) 07/25/2016 0944   CREATININE 1.6 (H) 07/25/2016 0944      Component Value Date/Time   CALCIUM 9.0 07/25/2016 0944   ALKPHOS 82 07/25/2016 0944   AST 25 07/25/2016 0944   ALT 14 07/25/2016 0944   BILITOT 1.30 07/25/2016 0944       Impression and Plan: Christian Burns is an 82 year old gentleman with IgG lambda myeloma.   I am absolutely amazed as to how well he is done. His monoclonal spike is now down to 0.3 g/dL. When we first started him on a Kyprolis, his M spike was up to 3.3 g/dL. His IgG level was 4600 mg/dL. It is now down to 495 mg/dL. His light chain has also come down incredibly well.  His lambda light chain has gone from 1200 mg/L down to 19 mg/L.  Thankfully, the MRI did not show any worsening of the plasmacytoma. In fact, the plasmacytoma looked smaller. He does have a lot of arthritic issues in the lower back. He may had seen his orthopedist for this.   Since we are having more difficulty with his blood work, I really don't think he needs labs with each visit. Hopefully weak and have labs only drawn once a month when he comes in to see me at the start of each cycle.   We will see him back ourselves in one month. He will have chemotherapy only the next 2 weeks.    [name],  MD 12/28/20172:11 PM

## 2016-08-08 NOTE — Addendum Note (Signed)
Addended by: Burney Gauze R on: 08/08/2016 02:18 PM   Modules accepted: Orders

## 2016-08-15 ENCOUNTER — Other Ambulatory Visit (HOSPITAL_BASED_OUTPATIENT_CLINIC_OR_DEPARTMENT_OTHER): Payer: Medicare HMO

## 2016-08-15 ENCOUNTER — Ambulatory Visit (HOSPITAL_BASED_OUTPATIENT_CLINIC_OR_DEPARTMENT_OTHER): Payer: Medicare HMO

## 2016-08-15 VITALS — BP 163/83 | HR 70 | Temp 98.1°F | Resp 18

## 2016-08-15 DIAGNOSIS — C9000 Multiple myeloma not having achieved remission: Secondary | ICD-10-CM

## 2016-08-15 DIAGNOSIS — Z5112 Encounter for antineoplastic immunotherapy: Secondary | ICD-10-CM | POA: Diagnosis not present

## 2016-08-15 DIAGNOSIS — C9002 Multiple myeloma in relapse: Secondary | ICD-10-CM | POA: Diagnosis not present

## 2016-08-15 LAB — CBC WITH DIFFERENTIAL (CANCER CENTER ONLY)
BASO#: 0 10*3/uL (ref 0.0–0.2)
BASO%: 0.5 % (ref 0.0–2.0)
EOS ABS: 0.2 10*3/uL (ref 0.0–0.5)
EOS%: 5 % (ref 0.0–7.0)
HEMATOCRIT: 25.4 % — AB (ref 38.7–49.9)
HEMOGLOBIN: 8.9 g/dL — AB (ref 13.0–17.1)
LYMPH#: 0.3 10*3/uL — ABNORMAL LOW (ref 0.9–3.3)
LYMPH%: 6.5 % — ABNORMAL LOW (ref 14.0–48.0)
MCH: 34.1 pg — AB (ref 28.0–33.4)
MCHC: 35 g/dL (ref 32.0–35.9)
MCV: 97 fL (ref 82–98)
MONO#: 0.7 10*3/uL (ref 0.1–0.9)
MONO%: 18.6 % — ABNORMAL HIGH (ref 0.0–13.0)
NEUT%: 69.4 % (ref 40.0–80.0)
NEUTROS ABS: 2.7 10*3/uL (ref 1.5–6.5)
Platelets: 135 10*3/uL — ABNORMAL LOW (ref 145–400)
RBC: 2.61 10*6/uL — ABNORMAL LOW (ref 4.20–5.70)
RDW: 14.6 % (ref 11.1–15.7)
WBC: 3.8 10*3/uL — ABNORMAL LOW (ref 4.0–10.0)

## 2016-08-15 LAB — CMP (CANCER CENTER ONLY)
ALBUMIN: 2.9 g/dL — AB (ref 3.3–5.5)
ALT(SGPT): 21 U/L (ref 10–47)
AST: 25 U/L (ref 11–38)
Alkaline Phosphatase: 88 U/L — ABNORMAL HIGH (ref 26–84)
BILIRUBIN TOTAL: 1.2 mg/dL (ref 0.20–1.60)
BUN, Bld: 29 mg/dL — ABNORMAL HIGH (ref 7–22)
CALCIUM: 8.5 mg/dL (ref 8.0–10.3)
CO2: 26 meq/L (ref 18–33)
Chloride: 109 mEq/L — ABNORMAL HIGH (ref 98–108)
Creat: 1.7 mg/dl — ABNORMAL HIGH (ref 0.6–1.2)
Glucose, Bld: 97 mg/dL (ref 73–118)
POTASSIUM: 4 meq/L (ref 3.3–4.7)
Sodium: 142 mEq/L (ref 128–145)
Total Protein: 5.6 g/dL — ABNORMAL LOW (ref 6.4–8.1)

## 2016-08-15 MED ORDER — ZOLEDRONIC ACID 4 MG/100ML IV SOLN: 4.0000 mg | Freq: Once | INTRAVENOUS | Status: DC

## 2016-08-15 MED ORDER — DEXTROSE 5 % IV SOLN
60.0000 mg | Freq: Once | INTRAVENOUS | Status: AC
  Administered 2016-08-15: 60 mg via INTRAVENOUS
  Filled 2016-08-15: qty 30

## 2016-08-15 MED ORDER — DEXAMETHASONE SODIUM PHOSPHATE 10 MG/ML IJ SOLN
INTRAMUSCULAR | Status: AC
  Filled 2016-08-15: qty 1

## 2016-08-15 MED ORDER — DEXAMETHASONE SODIUM PHOSPHATE 10 MG/ML IJ SOLN
10.0000 mg | Freq: Once | INTRAMUSCULAR | Status: AC
  Administered 2016-08-15: 10 mg via INTRAVENOUS

## 2016-08-15 MED ORDER — ZOLEDRONIC ACID 4 MG/5ML IV CONC
3.3000 mg | Freq: Once | INTRAVENOUS | Status: AC
  Administered 2016-08-15: 3.3 mg via INTRAVENOUS
  Filled 2016-08-15: qty 4.13

## 2016-08-15 MED ORDER — SODIUM CHLORIDE 0.9 % IV SOLN
Freq: Once | INTRAVENOUS | Status: AC
  Administered 2016-08-15: 12:00:00 via INTRAVENOUS

## 2016-08-15 NOTE — Patient Instructions (Signed)
[facility] Discharge Instructions for Patients Receiving Chemotherapy  Today you received the following chemotherapy agents Kyprolis  To help prevent nausea and vomiting after your treatment, we encourage you to take your nausea medication    If you develop nausea and vomiting that is not controlled by your nausea medication, call the clinic.   BELOW ARE SYMPTOMS THAT SHOULD BE REPORTED IMMEDIATELY:  *FEVER GREATER THAN 100.5 F  *CHILLS WITH OR WITHOUT FEVER  NAUSEA AND VOMITING THAT IS NOT CONTROLLED WITH YOUR NAUSEA MEDICATION  *UNUSUAL SHORTNESS OF BREATH  *UNUSUAL BRUISING OR BLEEDING  TENDERNESS IN MOUTH AND THROAT WITH OR WITHOUT PRESENCE OF ULCERS  *URINARY PROBLEMS  *BOWEL PROBLEMS  UNUSUAL RASH Items with * indicate a potential emergency and should be followed up as soon as possible.  Feel free to call the clinic you have any questions or concerns. The clinic phone number is [phone].  Please show the CHEMO ALERT CARD at check-in to the Emergency Department and triage nurse.   

## 2016-08-16 LAB — KAPPA/LAMBDA LIGHT CHAINS
Ig Kappa Free Light Chain: 13.8 mg/L (ref 3.3–19.4)
Ig Lambda Free Light Chain: 20.2 mg/L (ref 5.7–26.3)
Kappa/Lambda FluidC Ratio: 0.68 (ref 0.26–1.65)

## 2016-08-19 LAB — MULTIPLE MYELOMA PANEL, SERUM
Albumin SerPl Elph-Mcnc: 2.9 g/dL (ref 2.9–4.4)
Albumin/Glob SerPl: 1.5 (ref 0.7–1.7)
Alpha 1: 0.3 g/dL (ref 0.0–0.4)
Alpha2 Glob SerPl Elph-Mcnc: 0.6 g/dL (ref 0.4–1.0)
B-GLOBULIN SERPL ELPH-MCNC: 0.7 g/dL (ref 0.7–1.3)
Gamma Glob SerPl Elph-Mcnc: 0.4 g/dL (ref 0.4–1.8)
Globulin, Total: 2 g/dL — ABNORMAL LOW (ref 2.2–3.9)
IgA, Qn, Serum: 35 mg/dL — ABNORMAL LOW (ref 61–437)
IgG, Qn, Serum: 395 mg/dL — ABNORMAL LOW (ref 700–1600)
IgM, Qn, Serum: 15 mg/dL (ref 15–143)
M Protein SerPl Elph-Mcnc: 0.2 g/dL — ABNORMAL HIGH
TOTAL PROTEIN: 4.9 g/dL — AB (ref 6.0–8.5)

## 2016-08-22 ENCOUNTER — Other Ambulatory Visit (HOSPITAL_BASED_OUTPATIENT_CLINIC_OR_DEPARTMENT_OTHER): Payer: Medicare HMO

## 2016-08-22 ENCOUNTER — Ambulatory Visit (HOSPITAL_BASED_OUTPATIENT_CLINIC_OR_DEPARTMENT_OTHER): Payer: Medicare HMO

## 2016-08-22 VITALS — BP 177/85 | HR 77 | Temp 98.4°F | Resp 18

## 2016-08-22 DIAGNOSIS — Z5112 Encounter for antineoplastic immunotherapy: Secondary | ICD-10-CM

## 2016-08-22 DIAGNOSIS — C9002 Multiple myeloma in relapse: Secondary | ICD-10-CM

## 2016-08-22 DIAGNOSIS — C9000 Multiple myeloma not having achieved remission: Secondary | ICD-10-CM

## 2016-08-22 LAB — CBC WITH DIFFERENTIAL (CANCER CENTER ONLY)
BASO#: 0 10*3/uL (ref 0.0–0.2)
BASO%: 0.5 % (ref 0.0–2.0)
EOS%: 4.8 % (ref 0.0–7.0)
Eosinophils Absolute: 0.2 10*3/uL (ref 0.0–0.5)
HEMATOCRIT: 25.7 % — AB (ref 38.7–49.9)
HGB: 8.9 g/dL — ABNORMAL LOW (ref 13.0–17.1)
LYMPH#: 0.3 10*3/uL — AB (ref 0.9–3.3)
LYMPH%: 7.3 % — ABNORMAL LOW (ref 14.0–48.0)
MCH: 34.4 pg — ABNORMAL HIGH (ref 28.0–33.4)
MCHC: 34.6 g/dL (ref 32.0–35.9)
MCV: 99 fL — ABNORMAL HIGH (ref 82–98)
MONO#: 0.5 10*3/uL (ref 0.1–0.9)
MONO%: 13.1 % — ABNORMAL HIGH (ref 0.0–13.0)
NEUT%: 74.3 % (ref 40.0–80.0)
NEUTROS ABS: 3 10*3/uL (ref 1.5–6.5)
Platelets: 152 10*3/uL (ref 145–400)
RBC: 2.59 10*6/uL — AB (ref 4.20–5.70)
RDW: 14.4 % (ref 11.1–15.7)
WBC: 4 10*3/uL (ref 4.0–10.0)

## 2016-08-22 LAB — CMP (CANCER CENTER ONLY)
ALK PHOS: 89 U/L — AB (ref 26–84)
ALT: 15 U/L (ref 10–47)
AST: 20 U/L (ref 11–38)
Albumin: 3 g/dL — ABNORMAL LOW (ref 3.3–5.5)
BILIRUBIN TOTAL: 1.2 mg/dL (ref 0.20–1.60)
BUN: 25 mg/dL — AB (ref 7–22)
CALCIUM: 8.3 mg/dL (ref 8.0–10.3)
CO2: 27 mEq/L (ref 18–33)
Chloride: 112 mEq/L — ABNORMAL HIGH (ref 98–108)
Creat: 1.6 mg/dl — ABNORMAL HIGH (ref 0.6–1.2)
Glucose, Bld: 114 mg/dL (ref 73–118)
Potassium: 4.5 mEq/L (ref 3.3–4.7)
Sodium: 144 mEq/L (ref 128–145)
TOTAL PROTEIN: 5.5 g/dL — AB (ref 6.4–8.1)

## 2016-08-22 MED ORDER — CARFILZOMIB CHEMO INJECTION 60 MG
33.0000 mg/m2 | Freq: Once | INTRAVENOUS | Status: AC
  Administered 2016-08-22: 60 mg via INTRAVENOUS
  Filled 2016-08-22: qty 30

## 2016-08-22 MED ORDER — DEXAMETHASONE SODIUM PHOSPHATE 10 MG/ML IJ SOLN
10.0000 mg | Freq: Once | INTRAMUSCULAR | Status: AC
  Administered 2016-08-22: 10 mg via INTRAVENOUS

## 2016-08-22 MED ORDER — DEXAMETHASONE SODIUM PHOSPHATE 10 MG/ML IJ SOLN
INTRAMUSCULAR | Status: AC
  Filled 2016-08-22: qty 1

## 2016-08-22 MED ORDER — SODIUM CHLORIDE 0.9 % IV SOLN
Freq: Once | INTRAVENOUS | Status: AC
  Administered 2016-08-22: 10:00:00 via INTRAVENOUS

## 2016-08-22 NOTE — Patient Instructions (Signed)
Carfilzomib injection What is this medicine? CARFILZOMIB (kar FILZ oh mib) targets a specific protein within cancer cells and stops the cancer cells from growing. It is used to treat multiple myeloma. COMMON BRAND NAME(S): KYPROLIS What should I tell my health care provider before I take this medicine? They need to know if you have any of these conditions: -heart disease -history of blood clots -irregular heartbeat -kidney disease -liver disease -lung or breathing disease -an unusual or allergic reaction to carfilzomib, or other medicines, foods, dyes, or preservatives -pregnant or trying to get pregnant -breast-feeding How should I use this medicine? This medicine is for injection or infusion into a vein. It is given by a health care professional in a hospital or clinic setting. Talk to your pediatrician regarding the use of this medicine in children. Special care may be needed. What if I miss a dose? It is important not to miss your dose. Call your doctor or health care professional if you are unable to keep an appointment. What may interact with this medicine? Interactions are not expected. Give your health care provider a list of all the medicines, herbs, non-prescription drugs, or dietary supplements you use. Also tell them if you smoke, drink alcohol, or use illegal drugs. Some items may interact with your medicine. What should I watch for while using this medicine? Your condition will be monitored carefully while you are receiving this medicine. Report any side effects. Continue your course of treatment even though you feel ill unless your doctor tells you to stop. You may need blood work done while you are taking this medicine. Do not become pregnant while taking this medicine or for at least 30 days after stopping it. Women should inform their doctor if they wish to become pregnant or think they might be pregnant. There is a potential for serious side effects to an unborn child.  Men should not father a child while taking this medicine and for 90 days after stopping it. Talk to your health care professional or pharmacist for more information. Do not breast-feed an infant while taking this medicine. Check with your doctor or health care professional if you get an attack of severe diarrhea, nausea and vomiting, or if you sweat a lot. The loss of too much body fluid can make it dangerous for you to take this medicine. You may get dizzy. Do not drive, use machinery, or do anything that needs mental alertness until you know how this medicine affects you. Do not stand or sit up quickly, especially if you are an older patient. This reduces the risk of dizzy or fainting spells. What side effects may I notice from receiving this medicine? Side effects that you should report to your doctor or health care professional as soon as possible: -allergic reactions like skin rash, itching or hives, swelling of the face, lips, or tongue -confusion -dizziness -feeling faint or lightheaded -fever or chills -palpitations -seizures -signs and symptoms of bleeding such as bloody or black, tarry stools; red or dark-brown urine; spitting up blood or brown material that looks like coffee grounds; red spots on the skin; unusual bruising or bleeding including from the eye, gums, or nose -signs and symptoms of a blood clot such as breathing problems; changes in vision; chest pain; severe, sudden headache; pain, swelling, warmth in the leg; trouble speaking; sudden numbness or weakness of the face, arm or leg -signs and symptoms of kidney injury like trouble passing urine or change in the amount of urine -signs and   symptoms of liver injury like dark yellow or brown urine; general ill feeling or flu-like symptoms; light-colored stools; loss of appetite; nausea; right upper belly pain; unusually weak or tired; yellowing of the eyes or skin Side effects that usually do not require medical attention (report to  your doctor or health care professional if they continue or are bothersome): -back pain -cough -diarrhea -headache -muscle cramps -vomiting Where should I keep my medicine? This drug is given in a hospital or clinic and will not be stored at home.  2017 Elsevier/Gold Standard (2015-08-31 13:39:23)  

## 2016-08-23 LAB — KAPPA/LAMBDA LIGHT CHAINS
IG KAPPA FREE LIGHT CHAIN: 11.6 mg/L (ref 3.3–19.4)
Ig Lambda Free Light Chain: 18.9 mg/L (ref 5.7–26.3)
KAPPA/LAMBDA FLC RATIO: 0.61 (ref 0.26–1.65)

## 2016-08-26 LAB — MULTIPLE MYELOMA PANEL, SERUM
ALBUMIN/GLOB SERPL: 1.5 (ref 0.7–1.7)
ALPHA 1: 0.3 g/dL (ref 0.0–0.4)
Albumin SerPl Elph-Mcnc: 2.9 g/dL (ref 2.9–4.4)
Alpha2 Glob SerPl Elph-Mcnc: 0.7 g/dL (ref 0.4–1.0)
B-Globulin SerPl Elph-Mcnc: 0.7 g/dL (ref 0.7–1.3)
GAMMA GLOB SERPL ELPH-MCNC: 0.4 g/dL (ref 0.4–1.8)
GLOBULIN, TOTAL: 2 g/dL — AB (ref 2.2–3.9)
IGA/IMMUNOGLOBULIN A, SERUM: 34 mg/dL — AB (ref 61–437)
IGM (IMMUNOGLOBIN M), SRM: 15 mg/dL (ref 15–143)
IgG, Qn, Serum: 357 mg/dL — ABNORMAL LOW (ref 700–1600)
M Protein SerPl Elph-Mcnc: 0.1 g/dL — ABNORMAL HIGH
Total Protein: 4.9 g/dL — ABNORMAL LOW (ref 6.0–8.5)

## 2016-09-05 ENCOUNTER — Ambulatory Visit (HOSPITAL_BASED_OUTPATIENT_CLINIC_OR_DEPARTMENT_OTHER): Payer: Medicare HMO | Admitting: Hematology & Oncology

## 2016-09-05 ENCOUNTER — Other Ambulatory Visit (HOSPITAL_BASED_OUTPATIENT_CLINIC_OR_DEPARTMENT_OTHER): Payer: Medicare HMO

## 2016-09-05 ENCOUNTER — Ambulatory Visit (HOSPITAL_BASED_OUTPATIENT_CLINIC_OR_DEPARTMENT_OTHER): Payer: Medicare HMO

## 2016-09-05 VITALS — BP 194/91 | HR 67 | Temp 97.6°F | Resp 16 | Wt 140.8 lb

## 2016-09-05 DIAGNOSIS — M545 Low back pain: Secondary | ICD-10-CM

## 2016-09-05 DIAGNOSIS — Z5112 Encounter for antineoplastic immunotherapy: Secondary | ICD-10-CM

## 2016-09-05 DIAGNOSIS — C9001 Multiple myeloma in remission: Secondary | ICD-10-CM | POA: Diagnosis not present

## 2016-09-05 DIAGNOSIS — C9000 Multiple myeloma not having achieved remission: Secondary | ICD-10-CM

## 2016-09-05 DIAGNOSIS — D630 Anemia in neoplastic disease: Secondary | ICD-10-CM

## 2016-09-05 DIAGNOSIS — C9002 Multiple myeloma in relapse: Secondary | ICD-10-CM

## 2016-09-05 LAB — CMP (CANCER CENTER ONLY)
ALT(SGPT): 23 U/L (ref 10–47)
AST: 27 U/L (ref 11–38)
Albumin: 3.1 g/dL — ABNORMAL LOW (ref 3.3–5.5)
Alkaline Phosphatase: 91 U/L — ABNORMAL HIGH (ref 26–84)
BILIRUBIN TOTAL: 1.3 mg/dL (ref 0.20–1.60)
BUN: 24 mg/dL — AB (ref 7–22)
CO2: 26 meq/L (ref 18–33)
CREATININE: 1.7 mg/dL — AB (ref 0.6–1.2)
Calcium: 8.5 mg/dL (ref 8.0–10.3)
Chloride: 107 mEq/L (ref 98–108)
GLUCOSE: 117 mg/dL (ref 73–118)
Potassium: 4.3 mEq/L (ref 3.3–4.7)
SODIUM: 141 meq/L (ref 128–145)
Total Protein: 5.7 g/dL — ABNORMAL LOW (ref 6.4–8.1)

## 2016-09-05 LAB — CBC WITH DIFFERENTIAL (CANCER CENTER ONLY)
BASO#: 0 10*3/uL (ref 0.0–0.2)
BASO%: 0.9 % (ref 0.0–2.0)
EOS%: 4.7 % (ref 0.0–7.0)
Eosinophils Absolute: 0.2 10*3/uL (ref 0.0–0.5)
HCT: 27.4 % — ABNORMAL LOW (ref 38.7–49.9)
HEMOGLOBIN: 9.4 g/dL — AB (ref 13.0–17.1)
LYMPH#: 0.3 10*3/uL — ABNORMAL LOW (ref 0.9–3.3)
LYMPH%: 9 % — ABNORMAL LOW (ref 14.0–48.0)
MCH: 34.1 pg — ABNORMAL HIGH (ref 28.0–33.4)
MCHC: 34.3 g/dL (ref 32.0–35.9)
MCV: 99 fL — ABNORMAL HIGH (ref 82–98)
MONO#: 0.5 10*3/uL (ref 0.1–0.9)
MONO%: 14.9 % — AB (ref 0.0–13.0)
NEUT%: 70.5 % (ref 40.0–80.0)
NEUTROS ABS: 2.4 10*3/uL (ref 1.5–6.5)
Platelets: 152 10*3/uL (ref 145–400)
RBC: 2.76 10*6/uL — ABNORMAL LOW (ref 4.20–5.70)
RDW: 13.5 % (ref 11.1–15.7)
WBC: 3.4 10*3/uL — ABNORMAL LOW (ref 4.0–10.0)

## 2016-09-05 MED ORDER — SODIUM CHLORIDE 0.9 % IV SOLN
Freq: Once | INTRAVENOUS | Status: AC
  Administered 2016-09-05: 12:00:00 via INTRAVENOUS

## 2016-09-05 MED ORDER — DEXAMETHASONE SODIUM PHOSPHATE 10 MG/ML IJ SOLN
INTRAMUSCULAR | Status: AC
  Filled 2016-09-05: qty 1

## 2016-09-05 MED ORDER — DEXAMETHASONE SODIUM PHOSPHATE 10 MG/ML IJ SOLN
10.0000 mg | Freq: Once | INTRAMUSCULAR | Status: AC
  Administered 2016-09-05: 10 mg via INTRAVENOUS

## 2016-09-05 MED ORDER — DEXTROSE 5 % IV SOLN
60.0000 mg | Freq: Once | INTRAVENOUS | Status: AC
  Administered 2016-09-05: 60 mg via INTRAVENOUS
  Filled 2016-09-05: qty 30

## 2016-09-05 NOTE — Patient Instructions (Signed)
Carfilzomib injection What is this medicine? CARFILZOMIB (kar FILZ oh mib) targets a specific protein within cancer cells and stops the cancer cells from growing. It is used to treat multiple myeloma. COMMON BRAND NAME(S): KYPROLIS What should I tell my health care provider before I take this medicine? They need to know if you have any of these conditions: -heart disease -history of blood clots -irregular heartbeat -kidney disease -liver disease -lung or breathing disease -an unusual or allergic reaction to carfilzomib, or other medicines, foods, dyes, or preservatives -pregnant or trying to get pregnant -breast-feeding How should I use this medicine? This medicine is for injection or infusion into a vein. It is given by a health care professional in a hospital or clinic setting. Talk to your pediatrician regarding the use of this medicine in children. Special care may be needed. What if I miss a dose? It is important not to miss your dose. Call your doctor or health care professional if you are unable to keep an appointment. What may interact with this medicine? Interactions are not expected. Give your health care provider a list of all the medicines, herbs, non-prescription drugs, or dietary supplements you use. Also tell them if you smoke, drink alcohol, or use illegal drugs. Some items may interact with your medicine. What should I watch for while using this medicine? Your condition will be monitored carefully while you are receiving this medicine. Report any side effects. Continue your course of treatment even though you feel ill unless your doctor tells you to stop. You may need blood work done while you are taking this medicine. Do not become pregnant while taking this medicine or for at least 30 days after stopping it. Women should inform their doctor if they wish to become pregnant or think they might be pregnant. There is a potential for serious side effects to an unborn child.  Men should not father a child while taking this medicine and for 90 days after stopping it. Talk to your health care professional or pharmacist for more information. Do not breast-feed an infant while taking this medicine. Check with your doctor or health care professional if you get an attack of severe diarrhea, nausea and vomiting, or if you sweat a lot. The loss of too much body fluid can make it dangerous for you to take this medicine. You may get dizzy. Do not drive, use machinery, or do anything that needs mental alertness until you know how this medicine affects you. Do not stand or sit up quickly, especially if you are an older patient. This reduces the risk of dizzy or fainting spells. What side effects may I notice from receiving this medicine? Side effects that you should report to your doctor or health care professional as soon as possible: -allergic reactions like skin rash, itching or hives, swelling of the face, lips, or tongue -confusion -dizziness -feeling faint or lightheaded -fever or chills -palpitations -seizures -signs and symptoms of bleeding such as bloody or black, tarry stools; red or dark-brown urine; spitting up blood or brown material that looks like coffee grounds; red spots on the skin; unusual bruising or bleeding including from the eye, gums, or nose -signs and symptoms of a blood clot such as breathing problems; changes in vision; chest pain; severe, sudden headache; pain, swelling, warmth in the leg; trouble speaking; sudden numbness or weakness of the face, arm or leg -signs and symptoms of kidney injury like trouble passing urine or change in the amount of urine -signs and   symptoms of liver injury like dark yellow or brown urine; general ill feeling or flu-like symptoms; light-colored stools; loss of appetite; nausea; right upper belly pain; unusually weak or tired; yellowing of the eyes or skin Side effects that usually do not require medical attention (report to  your doctor or health care professional if they continue or are bothersome): -back pain -cough -diarrhea -headache -muscle cramps -vomiting Where should I keep my medicine? This drug is given in a hospital or clinic and will not be stored at home.  2017 Elsevier/Gold Standard (2015-08-31 13:39:23)  

## 2016-09-05 NOTE — Progress Notes (Signed)
Hematology and Oncology Follow Up Visit  DANYELL HASER [id number] 1930-04-09 83 y.o. 09/05/2016   Principle Diagnosis:   Recurrent IgG lambda myeloma - progressive  Hypercalcemia of malignancy.  Current Therapy:        Kyprolis/Cytoxan - s/p cycle #5 - Cytoxan dropped;Kyprolis q wk  Cytoxan 250mg  po q wk (3/1)/Ixazomib 4mg  po q week (3/1) - s/p cycle #4 - progression on 04/05/2016  Zometa 3.3 mg IV q. 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  Fanta is back for followup. He had a very nice New Year's. He got through the snowstorm we had last week. He is still able to help take care of his wife.   His response to treatment continues to amaze me. His last monoclonal spike was down to 0.1 g/dL. his IgG level was down to 357 mg/dL.  He has done well with the Kyprolis. We are just doing this weekly. This really is making his life easier. He is able to be with his disabled wife and help her.   He is eating okay. He's worried about not getting weight. I told him that I do not think that that was going to be an issue. His albumin has been creeping up which is a good sign.   He is anemic. We certainly could use Aranesp but blood pressure elevation has been a problem so I would not want to further expose him to any increased risk of cerebrovascular disease.   We did do an MRI of his lower back. This thankfully, showed that the plasmacytoma had shrunken in size. He does have arthritic issues which might because of his back discomfort with right sided radiculopathy. He really is not complaining too much of the back. He does not want to do anything else right now.   Overall, his performance status is ECOG 1.   Medications:  Current Outpatient Prescriptions:  .  aspirin EC 81 MG tablet, Take 81 mg by mouth daily. , Disp: , Rfl:  .  Calcium Carb-Cholecalciferol (CALCIUM 600 + D PO), Take by mouth every  morning., Disp: , Rfl:  .  Cyanocobalamin (VITAMIN B 12 PO), Take 1,000 mcg by mouth every morning. , Disp: , Rfl:  .  HYDROcodone-acetaminophen (NORCO/VICODIN) 5-325 MG tablet, Take 1 tablet by mouth every 6 (six) hours as needed for moderate pain. (Patient not taking: Reported on 08/08/2016), Disp: 60 tablet, Rfl: 0 .  LORazepam (ATIVAN) 0.5 MG tablet, Take 1 tablet (0.5 mg total) by mouth every 6 (six) hours as needed (Nausea or vomiting). (Patient not taking: Reported on 08/08/2016), Disp: 30 tablet, Rfl: 0 .  Multiple Vitamins-Minerals (MULTIVITAL PO), Take 1 tablet by mouth every morning. , Disp: , Rfl:  .  ondansetron (ZOFRAN) 8 MG tablet, Take 1 tablet (8 mg total) by mouth 2 (two) times daily as needed for refractory nausea / vomiting. Take as needed on days 4-7, 11-14, 18-28. (Patient not taking: Reported on 08/08/2016), Disp: 30 tablet, Rfl: 1 .  prochlorperazine (COMPAZINE) 10 MG tablet, Take 1 tablet (10 mg total) by mouth every 6 (six) hours as needed (Nausea or vomiting). (Patient not taking: Reported on 08/08/2016), Disp: 30 tablet, Rfl: 1 .  Pyridoxine HCl (VITAMIN B-6) 250 MG tablet, Take 500 mg by mouth daily., Disp: , Rfl:  .  Vitamin D, Ergocalciferol, (DRISDOL) 50000 UNITS CAPS capsule, Take 50,000 Units by mouth every 7 (seven)  days. Sundays, Disp: , Rfl:   Allergies:  Allergies  Allergen Reactions  . Sulfa Antibiotics Itching    Past Medical History, Surgical history, Social history, and Family History were reviewed and updated.  Review of Systems: As above  Physical Exam:  weight is 140 lb 12.8 oz (63.9 kg). His oral temperature is 97.6 F (36.4 C). His blood pressure is 194/91 (abnormal) and his pulse is 67. His respiration is 16 and oxygen saturation is 99%.   Thin, elderly gentleman. He is no distress. Head and neck exam shows no ocular or oral lesions. He has no palpable cervical or supraclavicular lymph nodes. Lungs are clear bilaterally. Cardiac exam is regular  rate and rhythm with no murmurs rubs or bruits. Abdomen is soft. He has good bowel sounds. There is no fluid wave. There is no palpable liver or spleen. Back exam no tenderness over the spine ribs or hips. He has some kyphosis. Extremities shows some age related changes. He has good range of motion and strength. Skin exam shows no rashes. Neurological exam is nonfocal.  Lab Results  Component Value Date   WBC 3.4 (L) 09/05/2016   HGB 9.4 (L) 09/05/2016   HCT 27.4 (L) 09/05/2016   MCV 99 (H) 09/05/2016   PLT 152 09/05/2016     Chemistry      Component Value Date/Time   NA 141 09/05/2016 1016   K 4.3 09/05/2016 1016   CL 107 09/05/2016 1016   CO2 26 09/05/2016 1016   BUN 24 (H) 09/05/2016 1016   CREATININE 1.7 (H) 09/05/2016 1016      Component Value Date/Time   CALCIUM 8.5 09/05/2016 1016   ALKPHOS 91 (H) 09/05/2016 1016   AST 27 09/05/2016 1016   ALT 23 09/05/2016 1016   BILITOT 1.30 09/05/2016 1016       Impression and Plan: Mr. Matsunaga is an 83 year old gentleman with IgG lambda myeloma.   I am absolutely amazed as to how well he is done. His monoclonal spike is now down to 0.1 g/dL. When we first started him on a Kyprolis, his M spike was up to 3.3 g/dL. His IgG level was 4600 mg/dL. It is now down to 357 mg/dL. His light chain has also come down incredibly well.  His lambda light chain has gone from 1200 mg/L down to 19 mg/L.  For now, I will keep him on the weekly schedule. If we continue to see stabilization or even further improvement in his myeloma labs, then we will plan for every other week therapy.   We will plan to get him back in another month. I am just so happy that his quality of life is doing so well and that he has responded to treatment.     [name], MD 1/25/201811:10 AM

## 2016-09-05 NOTE — Progress Notes (Signed)
OK to treat with today's BUN and Creat per MD ENNEVER

## 2016-09-06 LAB — IGG, IGA, IGM
IGA/IMMUNOGLOBULIN A, SERUM: 35 mg/dL — AB (ref 61–437)
IGM (IMMUNOGLOBIN M), SRM: 16 mg/dL (ref 15–143)
IgG, Qn, Serum: 387 mg/dL — ABNORMAL LOW (ref 700–1600)

## 2016-09-06 LAB — KAPPA/LAMBDA LIGHT CHAINS
IG KAPPA FREE LIGHT CHAIN: 12.8 mg/L (ref 3.3–19.4)
Ig Lambda Free Light Chain: 19.9 mg/L (ref 5.7–26.3)
KAPPA/LAMBDA FLC RATIO: 0.64 (ref 0.26–1.65)

## 2016-09-11 LAB — PROTEIN ELECTROPHORESIS, SERUM, WITH REFLEX
A/G Ratio: 1.5 (ref 0.7–1.7)
ALPHA 1: 0.3 g/dL (ref 0.0–0.4)
ALPHA 2: 0.7 g/dL (ref 0.4–1.0)
Albumin: 3.2 g/dL (ref 2.9–4.4)
Beta: 0.7 g/dL (ref 0.7–1.3)
GAMMA GLOBULIN: 0.4 g/dL (ref 0.4–1.8)
Globulin, Total: 2.1 g/dL — ABNORMAL LOW (ref 2.2–3.9)
Interpretation(See Below): 0
M-SPIKE, %: 0.1 g/dL — AB
Total Protein: 5.3 g/dL — ABNORMAL LOW (ref 6.0–8.5)

## 2016-09-12 ENCOUNTER — Ambulatory Visit (HOSPITAL_BASED_OUTPATIENT_CLINIC_OR_DEPARTMENT_OTHER): Payer: Medicare HMO

## 2016-09-12 ENCOUNTER — Other Ambulatory Visit (HOSPITAL_BASED_OUTPATIENT_CLINIC_OR_DEPARTMENT_OTHER): Payer: Medicare HMO

## 2016-09-12 VITALS — BP 152/80 | HR 74 | Temp 97.8°F | Resp 17

## 2016-09-12 DIAGNOSIS — Z5112 Encounter for antineoplastic immunotherapy: Secondary | ICD-10-CM

## 2016-09-12 DIAGNOSIS — C9002 Multiple myeloma in relapse: Secondary | ICD-10-CM

## 2016-09-12 DIAGNOSIS — C9000 Multiple myeloma not having achieved remission: Secondary | ICD-10-CM

## 2016-09-12 LAB — CMP (CANCER CENTER ONLY)
ALT(SGPT): 18 U/L (ref 10–47)
AST: 24 U/L (ref 11–38)
Albumin: 2.9 g/dL — ABNORMAL LOW (ref 3.3–5.5)
Alkaline Phosphatase: 86 U/L — ABNORMAL HIGH (ref 26–84)
BUN: 29 mg/dL — AB (ref 7–22)
CHLORIDE: 106 meq/L (ref 98–108)
CO2: 27 meq/L (ref 18–33)
CREATININE: 1.7 mg/dL — AB (ref 0.6–1.2)
Calcium: 8.3 mg/dL (ref 8.0–10.3)
Glucose, Bld: 108 mg/dL (ref 73–118)
POTASSIUM: 4.1 meq/L (ref 3.3–4.7)
Sodium: 146 mEq/L — ABNORMAL HIGH (ref 128–145)
Total Bilirubin: 1.2 mg/dl (ref 0.20–1.60)
Total Protein: 5.4 g/dL — ABNORMAL LOW (ref 6.4–8.1)

## 2016-09-12 LAB — CBC WITH DIFFERENTIAL (CANCER CENTER ONLY)
BASO#: 0 10*3/uL (ref 0.0–0.2)
BASO%: 0.7 % (ref 0.0–2.0)
EOS ABS: 0.2 10*3/uL (ref 0.0–0.5)
EOS%: 3.6 % (ref 0.0–7.0)
HCT: 27.5 % — ABNORMAL LOW (ref 38.7–49.9)
HGB: 9.4 g/dL — ABNORMAL LOW (ref 13.0–17.1)
LYMPH#: 0.3 10*3/uL — ABNORMAL LOW (ref 0.9–3.3)
LYMPH%: 7.5 % — AB (ref 14.0–48.0)
MCH: 33.8 pg — AB (ref 28.0–33.4)
MCHC: 34.2 g/dL (ref 32.0–35.9)
MCV: 99 fL — AB (ref 82–98)
MONO#: 0.6 10*3/uL (ref 0.1–0.9)
MONO%: 13.2 % — AB (ref 0.0–13.0)
NEUT#: 3.3 10*3/uL (ref 1.5–6.5)
NEUT%: 75 % (ref 40.0–80.0)
PLATELETS: 118 10*3/uL — AB (ref 145–400)
RBC: 2.78 10*6/uL — ABNORMAL LOW (ref 4.20–5.70)
RDW: 13.1 % (ref 11.1–15.7)
WBC: 4.4 10*3/uL (ref 4.0–10.0)

## 2016-09-12 MED ORDER — DEXAMETHASONE SODIUM PHOSPHATE 10 MG/ML IJ SOLN
10.0000 mg | Freq: Once | INTRAMUSCULAR | Status: AC
  Administered 2016-09-12: 10 mg via INTRAVENOUS

## 2016-09-12 MED ORDER — SODIUM CHLORIDE 0.9 % IV SOLN
Freq: Once | INTRAVENOUS | Status: AC
  Administered 2016-09-12: 12:00:00 via INTRAVENOUS

## 2016-09-12 MED ORDER — DEXTROSE 5 % IV SOLN
60.0000 mg | Freq: Once | INTRAVENOUS | Status: AC
  Administered 2016-09-12: 60 mg via INTRAVENOUS
  Filled 2016-09-12: qty 30

## 2016-09-12 MED ORDER — DEXAMETHASONE SODIUM PHOSPHATE 10 MG/ML IJ SOLN
INTRAMUSCULAR | Status: AC
  Filled 2016-09-12: qty 1

## 2016-09-12 NOTE — Progress Notes (Signed)
OK to treat with labs from today per MD Ennever

## 2016-09-12 NOTE — Patient Instructions (Signed)
[facility] Discharge Instructions for Patients Receiving Chemotherapy  Today you received the following chemotherapy agents: Kyprolis   To help prevent nausea and vomiting after your treatment, we encourage you to take your nausea medication as directed.    If you develop nausea and vomiting that is not controlled by your nausea medication, call the clinic.   BELOW ARE SYMPTOMS THAT SHOULD BE REPORTED IMMEDIATELY:  *FEVER GREATER THAN 100.5 F  *CHILLS WITH OR WITHOUT FEVER  NAUSEA AND VOMITING THAT IS NOT CONTROLLED WITH YOUR NAUSEA MEDICATION  *UNUSUAL SHORTNESS OF BREATH  *UNUSUAL BRUISING OR BLEEDING  TENDERNESS IN MOUTH AND THROAT WITH OR WITHOUT PRESENCE OF ULCERS  *URINARY PROBLEMS  *BOWEL PROBLEMS  UNUSUAL RASH Items with * indicate a potential emergency and should be followed up as soon as possible.  Feel free to call the clinic you have any questions or concerns. The clinic phone number is [phone].  Please show the CHEMO ALERT CARD at check-in to the Emergency Department and triage nurse.   

## 2016-09-13 LAB — KAPPA/LAMBDA LIGHT CHAINS
IG LAMBDA FREE LIGHT CHAIN: 22.2 mg/L (ref 5.7–26.3)
Ig Kappa Free Light Chain: 13.2 mg/L (ref 3.3–19.4)
KAPPA/LAMBDA FLC RATIO: 0.59 (ref 0.26–1.65)

## 2016-09-16 LAB — MULTIPLE MYELOMA PANEL, SERUM
ALBUMIN/GLOB SERPL: 1.5 (ref 0.7–1.7)
Albumin SerPl Elph-Mcnc: 2.9 g/dL (ref 2.9–4.4)
Alpha 1: 0.3 g/dL (ref 0.0–0.4)
Alpha2 Glob SerPl Elph-Mcnc: 0.7 g/dL (ref 0.4–1.0)
B-Globulin SerPl Elph-Mcnc: 0.7 g/dL (ref 0.7–1.3)
Gamma Glob SerPl Elph-Mcnc: 0.3 g/dL — ABNORMAL LOW (ref 0.4–1.8)
Globulin, Total: 2 g/dL — ABNORMAL LOW (ref 2.2–3.9)
IGA/IMMUNOGLOBULIN A, SERUM: 36 mg/dL — AB (ref 61–437)
IGM (IMMUNOGLOBIN M), SRM: 17 mg/dL (ref 15–143)
IgG, Qn, Serum: 352 mg/dL — ABNORMAL LOW (ref 700–1600)
M Protein SerPl Elph-Mcnc: 0.2 g/dL — ABNORMAL HIGH
TOTAL PROTEIN: 4.9 g/dL — AB (ref 6.0–8.5)

## 2016-09-18 ENCOUNTER — Other Ambulatory Visit: Payer: Self-pay | Admitting: *Deleted

## 2016-09-19 ENCOUNTER — Other Ambulatory Visit (HOSPITAL_BASED_OUTPATIENT_CLINIC_OR_DEPARTMENT_OTHER): Payer: Medicare HMO

## 2016-09-19 ENCOUNTER — Ambulatory Visit (HOSPITAL_BASED_OUTPATIENT_CLINIC_OR_DEPARTMENT_OTHER): Payer: Medicare HMO

## 2016-09-19 VITALS — BP 178/89 | HR 64 | Temp 97.4°F | Resp 18

## 2016-09-19 DIAGNOSIS — C9000 Multiple myeloma not having achieved remission: Secondary | ICD-10-CM | POA: Diagnosis not present

## 2016-09-19 DIAGNOSIS — Z5112 Encounter for antineoplastic immunotherapy: Secondary | ICD-10-CM | POA: Diagnosis not present

## 2016-09-19 DIAGNOSIS — C9002 Multiple myeloma in relapse: Secondary | ICD-10-CM

## 2016-09-19 LAB — CBC WITH DIFFERENTIAL (CANCER CENTER ONLY)
BASO#: 0 10*3/uL (ref 0.0–0.2)
BASO%: 0.2 % (ref 0.0–2.0)
EOS%: 3.5 % (ref 0.0–7.0)
Eosinophils Absolute: 0.2 10*3/uL (ref 0.0–0.5)
HCT: 26.3 % — ABNORMAL LOW (ref 38.7–49.9)
HEMOGLOBIN: 9.1 g/dL — AB (ref 13.0–17.1)
LYMPH#: 0.3 10*3/uL — AB (ref 0.9–3.3)
LYMPH%: 7.5 % — ABNORMAL LOW (ref 14.0–48.0)
MCH: 34.1 pg — ABNORMAL HIGH (ref 28.0–33.4)
MCHC: 34.6 g/dL (ref 32.0–35.9)
MCV: 99 fL — ABNORMAL HIGH (ref 82–98)
MONO#: 0.5 10*3/uL (ref 0.1–0.9)
MONO%: 11.8 % (ref 0.0–13.0)
NEUT%: 77 % (ref 40.0–80.0)
NEUTROS ABS: 3.5 10*3/uL (ref 1.5–6.5)
Platelets: 129 10*3/uL — ABNORMAL LOW (ref 145–400)
RBC: 2.67 10*6/uL — ABNORMAL LOW (ref 4.20–5.70)
RDW: 12.8 % (ref 11.1–15.7)
WBC: 4.5 10*3/uL (ref 4.0–10.0)

## 2016-09-19 LAB — CMP (CANCER CENTER ONLY)
ALBUMIN: 2.9 g/dL — AB (ref 3.3–5.5)
ALK PHOS: 85 U/L — AB (ref 26–84)
ALT: 17 U/L (ref 10–47)
AST: 21 U/L (ref 11–38)
BILIRUBIN TOTAL: 1.3 mg/dL (ref 0.20–1.60)
BUN, Bld: 29 mg/dL — ABNORMAL HIGH (ref 7–22)
CALCIUM: 8.3 mg/dL (ref 8.0–10.3)
CO2: 29 mEq/L (ref 18–33)
Chloride: 109 mEq/L — ABNORMAL HIGH (ref 98–108)
Creat: 1.7 mg/dl — ABNORMAL HIGH (ref 0.6–1.2)
Glucose, Bld: 145 mg/dL — ABNORMAL HIGH (ref 73–118)
Potassium: 4.3 mEq/L (ref 3.3–4.7)
Sodium: 145 mEq/L (ref 128–145)
TOTAL PROTEIN: 5.2 g/dL — AB (ref 6.4–8.1)

## 2016-09-19 MED ORDER — DEXTROSE 5 % IV SOLN
60.0000 mg | Freq: Once | INTRAVENOUS | Status: AC
  Administered 2016-09-19: 60 mg via INTRAVENOUS
  Filled 2016-09-19: qty 30

## 2016-09-19 MED ORDER — ZOLEDRONIC ACID 4 MG/5ML IV CONC
3.3000 mg | Freq: Once | INTRAVENOUS | Status: AC
  Administered 2016-09-19: 3.3 mg via INTRAVENOUS
  Filled 2016-09-19: qty 4.13

## 2016-09-19 MED ORDER — DEXAMETHASONE SODIUM PHOSPHATE 10 MG/ML IJ SOLN
10.0000 mg | Freq: Once | INTRAMUSCULAR | Status: AC
  Administered 2016-09-19: 10 mg via INTRAVENOUS

## 2016-09-19 MED ORDER — ZOLEDRONIC ACID 4 MG/100ML IV SOLN: 4.0000 mg | Freq: Once | INTRAVENOUS | Status: DC

## 2016-09-19 MED ORDER — HEPARIN SOD (PORK) LOCK FLUSH 100 UNIT/ML IV SOLN
500.0000 [IU] | Freq: Once | INTRAVENOUS | Status: DC | PRN
  Filled 2016-09-19: qty 5

## 2016-09-19 MED ORDER — DEXAMETHASONE SODIUM PHOSPHATE 10 MG/ML IJ SOLN
INTRAMUSCULAR | Status: AC
  Filled 2016-09-19: qty 1

## 2016-09-19 MED ORDER — SODIUM CHLORIDE 0.9 % IV SOLN
Freq: Once | INTRAVENOUS | Status: AC
  Administered 2016-09-19: 12:00:00 via INTRAVENOUS

## 2016-09-19 MED ORDER — SODIUM CHLORIDE 0.9% FLUSH
10.0000 mL | INTRAVENOUS | Status: DC | PRN
  Filled 2016-09-19: qty 10

## 2016-09-19 NOTE — Patient Instructions (Signed)

## 2016-10-03 ENCOUNTER — Ambulatory Visit (HOSPITAL_BASED_OUTPATIENT_CLINIC_OR_DEPARTMENT_OTHER): Payer: Medicare HMO | Admitting: Hematology & Oncology

## 2016-10-03 ENCOUNTER — Ambulatory Visit (HOSPITAL_BASED_OUTPATIENT_CLINIC_OR_DEPARTMENT_OTHER): Payer: Medicare HMO

## 2016-10-03 ENCOUNTER — Other Ambulatory Visit (HOSPITAL_BASED_OUTPATIENT_CLINIC_OR_DEPARTMENT_OTHER): Payer: Medicare HMO

## 2016-10-03 VITALS — BP 173/85 | HR 72 | Temp 97.8°F | Wt 140.4 lb

## 2016-10-03 DIAGNOSIS — Z5112 Encounter for antineoplastic immunotherapy: Secondary | ICD-10-CM

## 2016-10-03 DIAGNOSIS — C9002 Multiple myeloma in relapse: Secondary | ICD-10-CM | POA: Diagnosis not present

## 2016-10-03 DIAGNOSIS — C9001 Multiple myeloma in remission: Secondary | ICD-10-CM | POA: Diagnosis not present

## 2016-10-03 DIAGNOSIS — C9000 Multiple myeloma not having achieved remission: Secondary | ICD-10-CM

## 2016-10-03 LAB — CMP (CANCER CENTER ONLY)
ALBUMIN: 3.1 g/dL — AB (ref 3.3–5.5)
ALT(SGPT): 17 U/L (ref 10–47)
AST: 23 U/L (ref 11–38)
Alkaline Phosphatase: 82 U/L (ref 26–84)
BUN, Bld: 31 mg/dL — ABNORMAL HIGH (ref 7–22)
CHLORIDE: 110 meq/L — AB (ref 98–108)
CO2: 27 meq/L (ref 18–33)
CREATININE: 1.5 mg/dL — AB (ref 0.6–1.2)
Calcium: 8.3 mg/dL (ref 8.0–10.3)
Glucose, Bld: 96 mg/dL (ref 73–118)
POTASSIUM: 4.1 meq/L (ref 3.3–4.7)
SODIUM: 147 meq/L — AB (ref 128–145)
Total Bilirubin: 1.2 mg/dl (ref 0.20–1.60)
Total Protein: 5.6 g/dL — ABNORMAL LOW (ref 6.4–8.1)

## 2016-10-03 LAB — CBC WITH DIFFERENTIAL (CANCER CENTER ONLY)
BASO#: 0 10*3/uL (ref 0.0–0.2)
BASO%: 0.8 % (ref 0.0–2.0)
EOS ABS: 0.2 10*3/uL (ref 0.0–0.5)
EOS%: 3.1 % (ref 0.0–7.0)
HCT: 27.5 % — ABNORMAL LOW (ref 38.7–49.9)
HEMOGLOBIN: 9.7 g/dL — AB (ref 13.0–17.1)
LYMPH#: 0.3 10*3/uL — ABNORMAL LOW (ref 0.9–3.3)
LYMPH%: 6.9 % — ABNORMAL LOW (ref 14.0–48.0)
MCH: 34.4 pg — AB (ref 28.0–33.4)
MCHC: 35.3 g/dL (ref 32.0–35.9)
MCV: 98 fL (ref 82–98)
MONO#: 0.6 10*3/uL (ref 0.1–0.9)
MONO%: 12.9 % (ref 0.0–13.0)
NEUT%: 76.3 % (ref 40.0–80.0)
NEUTROS ABS: 3.7 10*3/uL (ref 1.5–6.5)
Platelets: 158 10*3/uL (ref 145–400)
RBC: 2.82 10*6/uL — ABNORMAL LOW (ref 4.20–5.70)
RDW: 12.9 % (ref 11.1–15.7)
WBC: 4.8 10*3/uL (ref 4.0–10.0)

## 2016-10-03 LAB — LACTATE DEHYDROGENASE: LDH: 209 U/L (ref 125–245)

## 2016-10-03 MED ORDER — DEXAMETHASONE SODIUM PHOSPHATE 10 MG/ML IJ SOLN
INTRAMUSCULAR | Status: AC
  Filled 2016-10-03: qty 1

## 2016-10-03 MED ORDER — SODIUM CHLORIDE 0.9 % IV SOLN
Freq: Once | INTRAVENOUS | Status: AC
  Administered 2016-10-03: 12:00:00 via INTRAVENOUS

## 2016-10-03 MED ORDER — CARFILZOMIB CHEMO INJECTION 60 MG
60.0000 mg | Freq: Once | INTRAVENOUS | Status: AC
  Administered 2016-10-03: 60 mg via INTRAVENOUS
  Filled 2016-10-03: qty 30

## 2016-10-03 MED ORDER — DEXAMETHASONE SODIUM PHOSPHATE 10 MG/ML IJ SOLN
10.0000 mg | Freq: Once | INTRAMUSCULAR | Status: AC
  Administered 2016-10-03: 10 mg via INTRAVENOUS

## 2016-10-03 NOTE — Progress Notes (Signed)
Hematology and Oncology Follow Up Visit  Christian Burns [id number] 1930-02-22 83 y.o. 10/03/2016   Principle Diagnosis:   Recurrent IgG lambda myeloma - progressive  Hypercalcemia of malignancy.  Current Therapy:        Kyprolis/Cytoxan - s/p cycle #5 - Cytoxan dropped;Kyprolis q wk  Cytoxan 250mg  po q wk (3/1)/Ixazomib 4mg  po q week (3/1) - s/p cycle #4 - progression on 04/05/2016  Zometa 3.3 mg IV q. 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  Burns is back for followup. He is doing okay. He is tolerated the Kyprolis very nicely. We basically [place] weekly therapy. He's done well with this. His last M spike was down to 0.2 g/dL. His IgG level was 352 mg/dL. His lambda light chain was 2.2 mg/dL.  He does have the inguinal hernia on the right side. This is caused a little bit difficulty but he otherwise really has had very little difficulty with this. He's had no problems with bowels or bladder. He's had no abdominal pain. He has had no leg swelling.  He does have the numbness in the feet. This is about the same.  His wife seems be doing pretty well. He wants to try to be held was much as he can to help her out. She has bad arthritis.   Overall, his performance status is ECOG 1.   Medications:  Current Outpatient Prescriptions:  .  aspirin EC 81 MG tablet, Take 81 mg by mouth daily. , Disp: , Rfl:  .  Calcium Carb-Cholecalciferol (CALCIUM 600 + D PO), Take by mouth every morning., Disp: , Rfl:  .  Cyanocobalamin (VITAMIN B 12 PO), Take 1,000 mcg by mouth every morning. , Disp: , Rfl:  .  HYDROcodone-acetaminophen (NORCO/VICODIN) 5-325 MG tablet, Take 1 tablet by mouth every 6 (six) hours as needed for moderate pain., Disp: 60 tablet, Rfl: 0 .  LORazepam (ATIVAN) 0.5 MG tablet, Take 1 tablet (0.5 mg total) by mouth every 6 (six) hours as needed (Nausea or vomiting)., Disp: 30 tablet, Rfl: 0 .   Multiple Vitamins-Minerals (MULTIVITAL PO), Take 1 tablet by mouth every morning. , Disp: , Rfl:  .  ondansetron (ZOFRAN) 8 MG tablet, Take 1 tablet (8 mg total) by mouth 2 (two) times daily as needed for refractory nausea / vomiting. Take as needed on days 4-7, 11-14, 18-28., Disp: 30 tablet, Rfl: 1 .  prochlorperazine (COMPAZINE) 10 MG tablet, Take 1 tablet (10 mg total) by mouth every 6 (six) hours as needed (Nausea or vomiting)., Disp: 30 tablet, Rfl: 1 .  Pyridoxine HCl (VITAMIN B-6) 250 MG tablet, Take 500 mg by mouth daily., Disp: , Rfl:  .  Vitamin D, Ergocalciferol, (DRISDOL) 50000 UNITS CAPS capsule, Take 50,000 Units by mouth every 7 (seven) days. Sundays, Disp: , Rfl:   Allergies:  Allergies  Allergen Reactions  . Sulfa Antibiotics Itching    Past Medical History, Surgical history, Social history, and Family History were reviewed and updated.  Review of Systems: As above  Physical Exam:  weight is 140 lb 6.4 oz (63.7 kg). His oral temperature is 97.8 F (36.6 C). His blood pressure is 173/85 (abnormal) and his pulse is 72.   Thin, elderly gentleman. He is no distress. Head and neck exam shows no ocular or oral lesions. He has no palpable cervical or supraclavicular lymph nodes. Lungs are clear bilaterally. Cardiac exam is regular  rate and rhythm with no murmurs rubs or bruits. Abdomen is soft. He has good bowel sounds. There is no fluid wave. There is no palpable liver or spleen. Back exam no tenderness over the spine ribs or hips. He has some kyphosis. Extremities shows some age related changes. He has good range of motion and strength. Skin exam shows no rashes. Neurological exam is nonfocal.  Lab Results  Component Value Date   WBC 4.8 10/03/2016   HGB 9.7 (L) 10/03/2016   HCT 27.5 (L) 10/03/2016   MCV 98 10/03/2016   PLT 158 10/03/2016     Chemistry      Component Value Date/Time   NA 147 (H) 10/03/2016 0950   K 4.1 10/03/2016 0950   CL 110 (H) 10/03/2016 0950    CO2 27 10/03/2016 0950   BUN 31 (H) 10/03/2016 0950   CREATININE 1.5 (H) 10/03/2016 0950      Component Value Date/Time   CALCIUM 8.3 10/03/2016 0950   ALKPHOS 82 10/03/2016 0950   AST 23 10/03/2016 0950   ALT 17 10/03/2016 0950   BILITOT 1.20 10/03/2016 0950       Impression and Plan: Christian Burns is an 83 year old gentleman with IgG lambda myeloma.   I will continue him on the weekly Kyprolis. If he continues to stabilize, then hopefully we can get him on every other week treatment.  We will plan to get him back to see [place] in another 4 weeks.  I'm just glad that he is done well with weekly therapy so that he can be home with his wife.    [name], MD 2/22/201811:04 AM

## 2016-10-03 NOTE — Patient Instructions (Signed)
Carfilzomib injection What is this medicine? CARFILZOMIB (kar FILZ oh mib) targets a specific protein within cancer cells and stops the cancer cells from growing. It is used to treat multiple myeloma. COMMON BRAND NAME(S): KYPROLIS What should I tell my health care provider before I take this medicine? They need to know if you have any of these conditions: -heart disease -history of blood clots -irregular heartbeat -kidney disease -liver disease -lung or breathing disease -an unusual or allergic reaction to carfilzomib, or other medicines, foods, dyes, or preservatives -pregnant or trying to get pregnant -breast-feeding How should I use this medicine? This medicine is for injection or infusion into a vein. It is given by a health care professional in a hospital or clinic setting. Talk to your pediatrician regarding the use of this medicine in children. Special care may be needed. What if I miss a dose? It is important not to miss your dose. Call your doctor or health care professional if you are unable to keep an appointment. What may interact with this medicine? Interactions are not expected. Give your health care provider a list of all the medicines, herbs, non-prescription drugs, or dietary supplements you use. Also tell them if you smoke, drink alcohol, or use illegal drugs. Some items may interact with your medicine. What should I watch for while using this medicine? Your condition will be monitored carefully while you are receiving this medicine. Report any side effects. Continue your course of treatment even though you feel ill unless your doctor tells you to stop. You may need blood work done while you are taking this medicine. Do not become pregnant while taking this medicine or for at least 30 days after stopping it. Women should inform their doctor if they wish to become pregnant or think they might be pregnant. There is a potential for serious side effects to an unborn child.  Men should not father a child while taking this medicine and for 90 days after stopping it. Talk to your health care professional or pharmacist for more information. Do not breast-feed an infant while taking this medicine. Check with your doctor or health care professional if you get an attack of severe diarrhea, nausea and vomiting, or if you sweat a lot. The loss of too much body fluid can make it dangerous for you to take this medicine. You may get dizzy. Do not drive, use machinery, or do anything that needs mental alertness until you know how this medicine affects you. Do not stand or sit up quickly, especially if you are an older patient. This reduces the risk of dizzy or fainting spells. What side effects may I notice from receiving this medicine? Side effects that you should report to your doctor or health care professional as soon as possible: -allergic reactions like skin rash, itching or hives, swelling of the face, lips, or tongue -confusion -dizziness -feeling faint or lightheaded -fever or chills -palpitations -seizures -signs and symptoms of bleeding such as bloody or black, tarry stools; red or dark-brown urine; spitting up blood or brown material that looks like coffee grounds; red spots on the skin; unusual bruising or bleeding including from the eye, gums, or nose -signs and symptoms of a blood clot such as breathing problems; changes in vision; chest pain; severe, sudden headache; pain, swelling, warmth in the leg; trouble speaking; sudden numbness or weakness of the face, arm or leg -signs and symptoms of kidney injury like trouble passing urine or change in the amount of urine -signs and   symptoms of liver injury like dark yellow or brown urine; general ill feeling or flu-like symptoms; light-colored stools; loss of appetite; nausea; right upper belly pain; unusually weak or tired; yellowing of the eyes or skin Side effects that usually do not require medical attention (report to  your doctor or health care professional if they continue or are bothersome): -back pain -cough -diarrhea -headache -muscle cramps -vomiting Where should I keep my medicine? This drug is given in a hospital or clinic and will not be stored at home.  2017 Elsevier/Gold Standard (2015-08-31 13:39:23)  

## 2016-10-04 LAB — IGG, IGA, IGM
IGG (IMMUNOGLOBIN G), SERUM: 356 mg/dL — AB (ref 700–1600)
IgA, Qn, Serum: 52 mg/dL — ABNORMAL LOW (ref 61–437)
IgM, Qn, Serum: 21 mg/dL (ref 15–143)

## 2016-10-04 LAB — KAPPA/LAMBDA LIGHT CHAINS
IG KAPPA FREE LIGHT CHAIN: 11.7 mg/L (ref 3.3–19.4)
Ig Lambda Free Light Chain: 21.2 mg/L (ref 5.7–26.3)
KAPPA/LAMBDA FLC RATIO: 0.55 (ref 0.26–1.65)

## 2016-10-07 DIAGNOSIS — L570 Actinic keratosis: Secondary | ICD-10-CM | POA: Diagnosis not present

## 2016-10-07 DIAGNOSIS — D485 Neoplasm of uncertain behavior of skin: Secondary | ICD-10-CM | POA: Diagnosis not present

## 2016-10-07 DIAGNOSIS — Z23 Encounter for immunization: Secondary | ICD-10-CM | POA: Diagnosis not present

## 2016-10-07 DIAGNOSIS — C44319 Basal cell carcinoma of skin of other parts of face: Secondary | ICD-10-CM | POA: Diagnosis not present

## 2016-10-08 LAB — PROTEIN ELECTROPHORESIS, SERUM, WITH REFLEX
A/G RATIO SPE: 1.8 — AB (ref 0.7–1.7)
ALBUMIN: 3.4 g/dL (ref 2.9–4.4)
Alpha 1: 0.2 g/dL (ref 0.0–0.4)
Alpha 2: 0.7 g/dL (ref 0.4–1.0)
Beta: 0.7 g/dL (ref 0.7–1.3)
GAMMA GLOBULIN: 0.3 g/dL — AB (ref 0.4–1.8)
Globulin, Total: 1.9 g/dL — ABNORMAL LOW (ref 2.2–3.9)
INTERPRETATION(SEE BELOW): 0
M-SPIKE, %: 0.1 g/dL — AB
TOTAL PROTEIN: 5.3 g/dL — AB (ref 6.0–8.5)

## 2016-10-10 ENCOUNTER — Ambulatory Visit (HOSPITAL_BASED_OUTPATIENT_CLINIC_OR_DEPARTMENT_OTHER): Payer: Medicare HMO

## 2016-10-10 ENCOUNTER — Other Ambulatory Visit (HOSPITAL_BASED_OUTPATIENT_CLINIC_OR_DEPARTMENT_OTHER): Payer: Medicare HMO

## 2016-10-10 VITALS — BP 181/80 | HR 73 | Temp 97.7°F | Resp 17

## 2016-10-10 DIAGNOSIS — C9002 Multiple myeloma in relapse: Secondary | ICD-10-CM | POA: Diagnosis not present

## 2016-10-10 DIAGNOSIS — C9000 Multiple myeloma not having achieved remission: Secondary | ICD-10-CM

## 2016-10-10 DIAGNOSIS — Z5112 Encounter for antineoplastic immunotherapy: Secondary | ICD-10-CM | POA: Diagnosis not present

## 2016-10-10 DIAGNOSIS — I10 Essential (primary) hypertension: Secondary | ICD-10-CM

## 2016-10-10 LAB — CBC WITH DIFFERENTIAL (CANCER CENTER ONLY)
BASO#: 0 10*3/uL (ref 0.0–0.2)
BASO%: 0.2 % (ref 0.0–2.0)
EOS ABS: 0.2 10*3/uL (ref 0.0–0.5)
EOS%: 3.5 % (ref 0.0–7.0)
HCT: 27.2 % — ABNORMAL LOW (ref 38.7–49.9)
HEMOGLOBIN: 9.6 g/dL — AB (ref 13.0–17.1)
LYMPH#: 0.4 10*3/uL — AB (ref 0.9–3.3)
LYMPH%: 8.7 % — AB (ref 14.0–48.0)
MCH: 34.2 pg — AB (ref 28.0–33.4)
MCHC: 35.3 g/dL (ref 32.0–35.9)
MCV: 97 fL (ref 82–98)
MONO#: 0.7 10*3/uL (ref 0.1–0.9)
MONO%: 15.4 % — ABNORMAL HIGH (ref 0.0–13.0)
NEUT%: 72.2 % (ref 40.0–80.0)
NEUTROS ABS: 3.3 10*3/uL (ref 1.5–6.5)
PLATELETS: 130 10*3/uL — AB (ref 145–400)
RBC: 2.81 10*6/uL — AB (ref 4.20–5.70)
RDW: 12.9 % (ref 11.1–15.7)
WBC: 4.6 10*3/uL (ref 4.0–10.0)

## 2016-10-10 LAB — CMP (CANCER CENTER ONLY)
ALBUMIN: 3.1 g/dL — AB (ref 3.3–5.5)
ALK PHOS: 78 U/L (ref 26–84)
ALT: 20 U/L (ref 10–47)
AST: 20 U/L (ref 11–38)
BUN: 28 mg/dL — AB (ref 7–22)
CO2: 28 mEq/L (ref 18–33)
CREATININE: 1.5 mg/dL — AB (ref 0.6–1.2)
Calcium: 8.4 mg/dL (ref 8.0–10.3)
Chloride: 107 mEq/L (ref 98–108)
Glucose, Bld: 89 mg/dL (ref 73–118)
POTASSIUM: 4.5 meq/L (ref 3.3–4.7)
Sodium: 142 mEq/L (ref 128–145)
TOTAL PROTEIN: 5.5 g/dL — AB (ref 6.4–8.1)
Total Bilirubin: 1.4 mg/dl (ref 0.20–1.60)

## 2016-10-10 MED ORDER — DEXAMETHASONE SODIUM PHOSPHATE 10 MG/ML IJ SOLN
INTRAMUSCULAR | Status: AC
  Filled 2016-10-10: qty 1

## 2016-10-10 MED ORDER — SODIUM CHLORIDE 0.9 % IV SOLN: Freq: Once | INTRAVENOUS | Status: DC

## 2016-10-10 MED ORDER — DEXTROSE 5 % IV SOLN
60.0000 mg | Freq: Once | INTRAVENOUS | Status: AC
  Administered 2016-10-10: 60 mg via INTRAVENOUS
  Filled 2016-10-10: qty 30

## 2016-10-10 MED ORDER — SODIUM CHLORIDE 0.9 % IV SOLN
Freq: Once | INTRAVENOUS | Status: AC
  Administered 2016-10-10: 13:00:00 via INTRAVENOUS

## 2016-10-10 MED ORDER — AMLODIPINE BESY-BENAZEPRIL HCL 5-10 MG PO CAPS: 1.0000 | ORAL_CAPSULE | Freq: Every day | ORAL | 5 refills | Status: DC

## 2016-10-10 MED ORDER — DEXAMETHASONE SODIUM PHOSPHATE 10 MG/ML IJ SOLN
10.0000 mg | Freq: Once | INTRAMUSCULAR | Status: AC
  Administered 2016-10-10: 10 mg via INTRAVENOUS

## 2016-10-10 NOTE — Patient Instructions (Signed)
Carfilzomib injection What is this medicine? CARFILZOMIB (kar FILZ oh mib) targets a specific protein within cancer cells and stops the cancer cells from growing. It is used to treat multiple myeloma. COMMON BRAND NAME(S): KYPROLIS What should I tell my health care provider before I take this medicine? They need to know if you have any of these conditions: -heart disease -history of blood clots -irregular heartbeat -kidney disease -liver disease -lung or breathing disease -an unusual or allergic reaction to carfilzomib, or other medicines, foods, dyes, or preservatives -pregnant or trying to get pregnant -breast-feeding How should I use this medicine? This medicine is for injection or infusion into a vein. It is given by a health care professional in a hospital or clinic setting. Talk to your pediatrician regarding the use of this medicine in children. Special care may be needed. What if I miss a dose? It is important not to miss your dose. Call your doctor or health care professional if you are unable to keep an appointment. What may interact with this medicine? Interactions are not expected. Give your health care provider a list of all the medicines, herbs, non-prescription drugs, or dietary supplements you use. Also tell them if you smoke, drink alcohol, or use illegal drugs. Some items may interact with your medicine. What should I watch for while using this medicine? Your condition will be monitored carefully while you are receiving this medicine. Report any side effects. Continue your course of treatment even though you feel ill unless your doctor tells you to stop. You may need blood work done while you are taking this medicine. Do not become pregnant while taking this medicine or for at least 30 days after stopping it. Women should inform their doctor if they wish to become pregnant or think they might be pregnant. There is a potential for serious side effects to an unborn child.  Men should not father a child while taking this medicine and for 90 days after stopping it. Talk to your health care professional or pharmacist for more information. Do not breast-feed an infant while taking this medicine. Check with your doctor or health care professional if you get an attack of severe diarrhea, nausea and vomiting, or if you sweat a lot. The loss of too much body fluid can make it dangerous for you to take this medicine. You may get dizzy. Do not drive, use machinery, or do anything that needs mental alertness until you know how this medicine affects you. Do not stand or sit up quickly, especially if you are an older patient. This reduces the risk of dizzy or fainting spells. What side effects may I notice from receiving this medicine? Side effects that you should report to your doctor or health care professional as soon as possible: -allergic reactions like skin rash, itching or hives, swelling of the face, lips, or tongue -confusion -dizziness -feeling faint or lightheaded -fever or chills -palpitations -seizures -signs and symptoms of bleeding such as bloody or black, tarry stools; red or dark-brown urine; spitting up blood or brown material that looks like coffee grounds; red spots on the skin; unusual bruising or bleeding including from the eye, gums, or nose -signs and symptoms of a blood clot such as breathing problems; changes in vision; chest pain; severe, sudden headache; pain, swelling, warmth in the leg; trouble speaking; sudden numbness or weakness of the face, arm or leg -signs and symptoms of kidney injury like trouble passing urine or change in the amount of urine -signs and   symptoms of liver injury like dark yellow or brown urine; general ill feeling or flu-like symptoms; light-colored stools; loss of appetite; nausea; right upper belly pain; unusually weak or tired; yellowing of the eyes or skin Side effects that usually do not require medical attention (report to  your doctor or health care professional if they continue or are bothersome): -back pain -cough -diarrhea -headache -muscle cramps -vomiting Where should I keep my medicine? This drug is given in a hospital or clinic and will not be stored at home.  2017 Elsevier/Gold Standard (2015-08-31 13:39:23)  

## 2016-10-10 NOTE — Progress Notes (Signed)
Dr. Marin Olp aware of VS prior to treatment. Patient feels well, states he has never taken HTN meds and has not been consulted by PCP for this.

## 2016-10-14 DIAGNOSIS — L821 Other seborrheic keratosis: Secondary | ICD-10-CM | POA: Diagnosis not present

## 2016-10-14 DIAGNOSIS — L570 Actinic keratosis: Secondary | ICD-10-CM | POA: Diagnosis not present

## 2016-10-14 DIAGNOSIS — D225 Melanocytic nevi of trunk: Secondary | ICD-10-CM | POA: Diagnosis not present

## 2016-10-14 DIAGNOSIS — L814 Other melanin hyperpigmentation: Secondary | ICD-10-CM | POA: Diagnosis not present

## 2016-10-14 DIAGNOSIS — Z85828 Personal history of other malignant neoplasm of skin: Secondary | ICD-10-CM | POA: Diagnosis not present

## 2016-10-14 DIAGNOSIS — D1800 Hemangioma unspecified site: Secondary | ICD-10-CM | POA: Diagnosis not present

## 2016-10-17 ENCOUNTER — Ambulatory Visit (HOSPITAL_BASED_OUTPATIENT_CLINIC_OR_DEPARTMENT_OTHER): Payer: Medicare HMO

## 2016-10-17 ENCOUNTER — Other Ambulatory Visit (HOSPITAL_BASED_OUTPATIENT_CLINIC_OR_DEPARTMENT_OTHER): Payer: Medicare HMO

## 2016-10-17 VITALS — BP 146/71 | HR 75 | Temp 98.1°F | Resp 16

## 2016-10-17 DIAGNOSIS — C9000 Multiple myeloma not having achieved remission: Secondary | ICD-10-CM

## 2016-10-17 DIAGNOSIS — Z5112 Encounter for antineoplastic immunotherapy: Secondary | ICD-10-CM | POA: Diagnosis not present

## 2016-10-17 DIAGNOSIS — C9002 Multiple myeloma in relapse: Secondary | ICD-10-CM

## 2016-10-17 LAB — CBC WITH DIFFERENTIAL (CANCER CENTER ONLY)
BASO#: 0 10*3/uL (ref 0.0–0.2)
BASO%: 0.4 % (ref 0.0–2.0)
EOS ABS: 0.2 10*3/uL (ref 0.0–0.5)
EOS%: 4.5 % (ref 0.0–7.0)
HCT: 27.2 % — ABNORMAL LOW (ref 38.7–49.9)
HEMOGLOBIN: 9.6 g/dL — AB (ref 13.0–17.1)
LYMPH#: 0.3 10*3/uL — ABNORMAL LOW (ref 0.9–3.3)
LYMPH%: 6.6 % — ABNORMAL LOW (ref 14.0–48.0)
MCH: 33.9 pg — AB (ref 28.0–33.4)
MCHC: 35.3 g/dL (ref 32.0–35.9)
MCV: 96 fL (ref 82–98)
MONO#: 0.7 10*3/uL (ref 0.1–0.9)
MONO%: 13.2 % — ABNORMAL HIGH (ref 0.0–13.0)
NEUT%: 75.3 % (ref 40.0–80.0)
NEUTROS ABS: 3.9 10*3/uL (ref 1.5–6.5)
Platelets: 144 10*3/uL — ABNORMAL LOW (ref 145–400)
RBC: 2.83 10*6/uL — AB (ref 4.20–5.70)
RDW: 12.9 % (ref 11.1–15.7)
WBC: 5.2 10*3/uL (ref 4.0–10.0)

## 2016-10-17 LAB — CMP (CANCER CENTER ONLY)
ALT: 22 U/L (ref 10–47)
AST: 22 U/L (ref 11–38)
Albumin: 3.1 g/dL — ABNORMAL LOW (ref 3.3–5.5)
Alkaline Phosphatase: 80 U/L (ref 26–84)
BILIRUBIN TOTAL: 1.4 mg/dL (ref 0.20–1.60)
BUN: 31 mg/dL — AB (ref 7–22)
CO2: 28 meq/L (ref 18–33)
Calcium: 9.2 mg/dL (ref 8.0–10.3)
Chloride: 106 mEq/L (ref 98–108)
Creat: 2.1 mg/dl — ABNORMAL HIGH (ref 0.6–1.2)
GLUCOSE: 93 mg/dL (ref 73–118)
Potassium: 4.5 mEq/L (ref 3.3–4.7)
Sodium: 147 mEq/L — ABNORMAL HIGH (ref 128–145)
Total Protein: 5.6 g/dL — ABNORMAL LOW (ref 6.4–8.1)

## 2016-10-17 MED ORDER — DEXTROSE 5 % IV SOLN
60.0000 mg | Freq: Once | INTRAVENOUS | Status: AC
  Administered 2016-10-17: 60 mg via INTRAVENOUS
  Filled 2016-10-17: qty 30

## 2016-10-17 MED ORDER — SODIUM CHLORIDE 0.9 % IV SOLN
Freq: Once | INTRAVENOUS | Status: AC
  Administered 2016-10-17: 11:00:00 via INTRAVENOUS

## 2016-10-17 MED ORDER — DEXAMETHASONE SODIUM PHOSPHATE 10 MG/ML IJ SOLN
10.0000 mg | Freq: Once | INTRAMUSCULAR | Status: AC
  Administered 2016-10-17: 10 mg via INTRAVENOUS

## 2016-10-17 MED ORDER — DEXAMETHASONE SODIUM PHOSPHATE 10 MG/ML IJ SOLN
INTRAMUSCULAR | Status: AC
Start: 2016-10-17 — End: 2016-10-17
  Filled 2016-10-17: qty 1

## 2016-10-17 MED ORDER — SODIUM CHLORIDE 0.9 % IV SOLN: Freq: Once | INTRAVENOUS | Status: DC

## 2016-10-17 NOTE — Patient Instructions (Signed)
Carfilzomib injection What is this medicine? CARFILZOMIB (kar FILZ oh mib) targets a specific protein within cancer cells and stops the cancer cells from growing. It is used to treat multiple myeloma. This medicine may be used for other purposes; ask your health care provider or pharmacist if you have questions. COMMON BRAND NAME(S): KYPROLIS What should I tell my health care provider before I take this medicine? They need to know if you have any of these conditions: -heart disease -history of blood clots -irregular heartbeat -kidney disease -liver disease -lung or breathing disease -an unusual or allergic reaction to carfilzomib, or other medicines, foods, dyes, or preservatives -pregnant or trying to get pregnant -breast-feeding How should I use this medicine? This medicine is for injection or infusion into a vein. It is given by a health care professional in a hospital or clinic setting. Talk to your pediatrician regarding the use of this medicine in children. Special care may be needed. Overdosage: If you think you have taken too much of this medicine contact a poison control center or emergency room at once. NOTE: This medicine is only for you. Do not share this medicine with others. What if I miss a dose? It is important not to miss your dose. Call your doctor or health care professional if you are unable to keep an appointment. What may interact with this medicine? Interactions are not expected. Give your health care provider a list of all the medicines, herbs, non-prescription drugs, or dietary supplements you use. Also tell them if you smoke, drink alcohol, or use illegal drugs. Some items may interact with your medicine. This list may not describe all possible interactions. Give your health care provider a list of all the medicines, herbs, non-prescription drugs, or dietary supplements you use. Also tell them if you smoke, drink alcohol, or use illegal drugs. Some items may  interact with your medicine. What should I watch for while using this medicine? Your condition will be monitored carefully while you are receiving this medicine. Report any side effects. Continue your course of treatment even though you feel ill unless your doctor tells you to stop. You may need blood work done while you are taking this medicine. Do not become pregnant while taking this medicine or for at least 30 days after stopping it. Women should inform their doctor if they wish to become pregnant or think they might be pregnant. There is a potential for serious side effects to an unborn child. Men should not father a child while taking this medicine and for 90 days after stopping it. Talk to your health care professional or pharmacist for more information. Do not breast-feed an infant while taking this medicine. Check with your doctor or health care professional if you get an attack of severe diarrhea, nausea and vomiting, or if you sweat a lot. The loss of too much body fluid can make it dangerous for you to take this medicine. You may get dizzy. Do not drive, use machinery, or do anything that needs mental alertness until you know how this medicine affects you. Do not stand or sit up quickly, especially if you are an older patient. This reduces the risk of dizzy or fainting spells. What side effects may I notice from receiving this medicine? Side effects that you should report to your doctor or health care professional as soon as possible: -allergic reactions like skin rash, itching or hives, swelling of the face, lips, or tongue -confusion -dizziness -feeling faint or lightheaded -fever or chills -  palpitations -seizures -signs and symptoms of bleeding such as bloody or black, tarry stools; red or dark-brown urine; spitting up blood or brown material that looks like coffee grounds; red spots on the skin; unusual bruising or bleeding including from the eye, gums, or nose -signs and symptoms of  a blood clot such as breathing problems; changes in vision; chest pain; severe, sudden headache; pain, swelling, warmth in the leg; trouble speaking; sudden numbness or weakness of the face, arm or leg -signs and symptoms of kidney injury like trouble passing urine or change in the amount of urine -signs and symptoms of liver injury like dark yellow or brown urine; general ill feeling or flu-like symptoms; light-colored stools; loss of appetite; nausea; right upper belly pain; unusually weak or tired; yellowing of the eyes or skin Side effects that usually do not require medical attention (report to your doctor or health care professional if they continue or are bothersome): -back pain -cough -diarrhea -headache -muscle cramps -vomiting This list may not describe all possible side effects. Call your doctor for medical advice about side effects. You may report side effects to FDA at 1-800-FDA-1088. Where should I keep my medicine? This drug is given in a hospital or clinic and will not be stored at home. NOTE: This sheet is a summary. It may not cover all possible information. If you have questions about this medicine, talk to your doctor, pharmacist, or health care provider.  2018 Elsevier/Gold Standard (2015-08-31 13:39:23)  

## 2016-10-31 ENCOUNTER — Ambulatory Visit (HOSPITAL_BASED_OUTPATIENT_CLINIC_OR_DEPARTMENT_OTHER): Payer: Medicare HMO | Admitting: Hematology & Oncology

## 2016-10-31 ENCOUNTER — Other Ambulatory Visit (HOSPITAL_BASED_OUTPATIENT_CLINIC_OR_DEPARTMENT_OTHER): Payer: Medicare HMO

## 2016-10-31 ENCOUNTER — Ambulatory Visit (HOSPITAL_BASED_OUTPATIENT_CLINIC_OR_DEPARTMENT_OTHER): Payer: Medicare HMO

## 2016-10-31 ENCOUNTER — Encounter: Payer: Self-pay | Admitting: Hematology & Oncology

## 2016-10-31 VITALS — BP 174/75 | HR 69 | Temp 97.7°F | Resp 16 | Wt 142.0 lb

## 2016-10-31 DIAGNOSIS — C9002 Multiple myeloma in relapse: Secondary | ICD-10-CM

## 2016-10-31 DIAGNOSIS — N189 Chronic kidney disease, unspecified: Secondary | ICD-10-CM

## 2016-10-31 DIAGNOSIS — C9000 Multiple myeloma not having achieved remission: Secondary | ICD-10-CM | POA: Diagnosis not present

## 2016-10-31 DIAGNOSIS — Z5112 Encounter for antineoplastic immunotherapy: Secondary | ICD-10-CM

## 2016-10-31 DIAGNOSIS — D631 Anemia in chronic kidney disease: Secondary | ICD-10-CM | POA: Diagnosis not present

## 2016-10-31 LAB — CBC WITH DIFFERENTIAL (CANCER CENTER ONLY)
BASO#: 0 10*3/uL (ref 0.0–0.2)
BASO%: 0.4 % (ref 0.0–2.0)
EOS ABS: 0.2 10*3/uL (ref 0.0–0.5)
EOS%: 4.4 % (ref 0.0–7.0)
HEMATOCRIT: 26.2 % — AB (ref 38.7–49.9)
HGB: 9.1 g/dL — ABNORMAL LOW (ref 13.0–17.1)
LYMPH#: 0.3 10*3/uL — AB (ref 0.9–3.3)
LYMPH%: 6.7 % — ABNORMAL LOW (ref 14.0–48.0)
MCH: 33.7 pg — ABNORMAL HIGH (ref 28.0–33.4)
MCHC: 34.7 g/dL (ref 32.0–35.9)
MCV: 97 fL (ref 82–98)
MONO#: 0.6 10*3/uL (ref 0.1–0.9)
MONO%: 12.1 % (ref 0.0–13.0)
NEUT#: 3.7 10*3/uL (ref 1.5–6.5)
NEUT%: 76.4 % (ref 40.0–80.0)
Platelets: 157 10*3/uL (ref 145–400)
RBC: 2.7 10*6/uL — ABNORMAL LOW (ref 4.20–5.70)
RDW: 13 % (ref 11.1–15.7)
WBC: 4.8 10*3/uL (ref 4.0–10.0)

## 2016-10-31 LAB — CMP (CANCER CENTER ONLY)
ALBUMIN: 3 g/dL — AB (ref 3.3–5.5)
ALK PHOS: 88 U/L — AB (ref 26–84)
ALT(SGPT): 20 U/L (ref 10–47)
AST: 24 U/L (ref 11–38)
BUN, Bld: 21 mg/dL (ref 7–22)
CALCIUM: 8.8 mg/dL (ref 8.0–10.3)
CO2: 26 meq/L (ref 18–33)
Chloride: 106 mEq/L (ref 98–108)
Creat: 1.6 mg/dl — ABNORMAL HIGH (ref 0.6–1.2)
GLUCOSE: 114 mg/dL (ref 73–118)
POTASSIUM: 4.1 meq/L (ref 3.3–4.7)
Sodium: 144 mEq/L (ref 128–145)
Total Bilirubin: 1.2 mg/dl (ref 0.20–1.60)
Total Protein: 5.4 g/dL — ABNORMAL LOW (ref 6.4–8.1)

## 2016-10-31 MED ORDER — ZOLEDRONIC ACID 4 MG/5ML IV CONC
3.3000 mg | Freq: Once | INTRAVENOUS | Status: AC
  Administered 2016-10-31: 3.3 mg via INTRAVENOUS
  Filled 2016-10-31: qty 4.13

## 2016-10-31 MED ORDER — SODIUM CHLORIDE 0.9 % IV SOLN
Freq: Once | INTRAVENOUS | Status: AC
  Administered 2016-10-31: 13:00:00 via INTRAVENOUS

## 2016-10-31 MED ORDER — DEXTROSE 5 % IV SOLN
60.0000 mg | Freq: Once | INTRAVENOUS | Status: AC
  Administered 2016-10-31: 60 mg via INTRAVENOUS
  Filled 2016-10-31: qty 30

## 2016-10-31 MED ORDER — DEXAMETHASONE SODIUM PHOSPHATE 10 MG/ML IJ SOLN
10.0000 mg | Freq: Once | INTRAMUSCULAR | Status: AC
Start: 2016-10-31 — End: 2016-10-31
  Administered 2016-10-31: 10 mg via INTRAVENOUS

## 2016-10-31 MED ORDER — DEXAMETHASONE SODIUM PHOSPHATE 10 MG/ML IJ SOLN
INTRAMUSCULAR | Status: AC
  Filled 2016-10-31: qty 1

## 2016-10-31 MED ORDER — SODIUM CHLORIDE 0.9 % IV SOLN: Freq: Once | INTRAVENOUS | Status: DC

## 2016-10-31 NOTE — Patient Instructions (Signed)
Zoledronic Acid injection (Hypercalcemia, Oncology) What is this medicine? ZOLEDRONIC ACID (ZOE le dron ik AS id) lowers the amount of calcium loss from bone. It is used to treat too much calcium in your blood from cancer. It is also used to prevent complications of cancer that has spread to the bone. This medicine may be used for other purposes; ask your health care provider or pharmacist if you have questions. COMMON BRAND NAME(S): Zometa What should I tell my health care provider before I take this medicine? They need to know if you have any of these conditions: -aspirin-sensitive asthma -cancer, especially if you are receiving medicines used to treat cancer -dental disease or wear dentures -infection -kidney disease -receiving corticosteroids like dexamethasone or prednisone -an unusual or allergic reaction to zoledronic acid, other medicines, foods, dyes, or preservatives -pregnant or trying to get pregnant -breast-feeding How should I use this medicine? This medicine is for infusion into a vein. It is given by a health care professional in a hospital or clinic setting. Talk to your pediatrician regarding the use of this medicine in children. Special care may be needed. Overdosage: If you think you have taken too much of this medicine contact a poison control center or emergency room at once. NOTE: This medicine is only for you. Do not share this medicine with others. What if I miss a dose? It is important not to miss your dose. Call your doctor or health care professional if you are unable to keep an appointment. What may interact with this medicine? -certain antibiotics given by injection -NSAIDs, medicines for pain and inflammation, like ibuprofen or naproxen -some diuretics like bumetanide, furosemide -teriparatide -thalidomide This list may not describe all possible interactions. Give your health care provider a list of all the medicines, herbs, non-prescription drugs, or  dietary supplements you use. Also tell them if you smoke, drink alcohol, or use illegal drugs. Some items may interact with your medicine. What should I watch for while using this medicine? Visit your doctor or health care professional for regular checkups. It may be some time before you see the benefit from this medicine. Do not stop taking your medicine unless your doctor tells you to. Your doctor may order blood tests or other tests to see how you are doing. Women should inform their doctor if they wish to become pregnant or think they might be pregnant. There is a potential for serious side effects to an unborn child. Talk to your health care professional or pharmacist for more information. You should make sure that you get enough calcium and vitamin D while you are taking this medicine. Discuss the foods you eat and the vitamins you take with your health care professional. Some people who take this medicine have severe bone, joint, and/or muscle pain. This medicine may also increase your risk for jaw problems or a broken thigh bone. Tell your doctor right away if you have severe pain in your jaw, bones, joints, or muscles. Tell your doctor if you have any pain that does not go away or that gets worse. Tell your dentist and dental surgeon that you are taking this medicine. You should not have major dental surgery while on this medicine. See your dentist to have a dental exam and fix any dental problems before starting this medicine. Take good care of your teeth while on this medicine. Make sure you see your dentist for regular follow-up appointments. What side effects may I notice from receiving this medicine? Side effects that   you should report to your doctor or health care professional as soon as possible: -allergic reactions like skin rash, itching or hives, swelling of the face, lips, or tongue -anxiety, confusion, or depression -breathing problems -changes in vision -eye pain -feeling faint or  lightheaded, falls -jaw pain, especially after dental work -mouth sores -muscle cramps, stiffness, or weakness -redness, blistering, peeling or loosening of the skin, including inside the mouth -trouble passing urine or change in the amount of urine Side effects that usually do not require medical attention (report to your doctor or health care professional if they continue or are bothersome): -bone, joint, or muscle pain -constipation -diarrhea -fever -hair loss -irritation at site where injected -loss of appetite -nausea, vomiting -stomach upset -trouble sleeping -trouble swallowing -weak or tired This list may not describe all possible side effects. Call your doctor for medical advice about side effects. You may report side effects to FDA at 1-800-FDA-1088. Where should I keep my medicine? This drug is given in a hospital or clinic and will not be stored at home. NOTE: This sheet is a summary. It may not cover all possible information. If you have questions about this medicine, talk to your doctor, pharmacist, or health care provider.  2018 Elsevier/Gold Standard (2013-12-25 14:19:39) Carfilzomib injection What is this medicine? CARFILZOMIB (kar FILZ oh mib) targets a specific protein within cancer cells and stops the cancer cells from growing. It is used to treat multiple myeloma. This medicine may be used for other purposes; ask your health care provider or pharmacist if you have questions. COMMON BRAND NAME(S): KYPROLIS What should I tell my health care provider before I take this medicine? They need to know if you have any of these conditions: -heart disease -history of blood clots -irregular heartbeat -kidney disease -liver disease -lung or breathing disease -an unusual or allergic reaction to carfilzomib, or other medicines, foods, dyes, or preservatives -pregnant or trying to get pregnant -breast-feeding How should I use this medicine? This medicine is for  injection or infusion into a vein. It is given by a health care professional in a hospital or clinic setting. Talk to your pediatrician regarding the use of this medicine in children. Special care may be needed. Overdosage: If you think you have taken too much of this medicine contact a poison control center or emergency room at once. NOTE: This medicine is only for you. Do not share this medicine with others. What if I miss a dose? It is important not to miss your dose. Call your doctor or health care professional if you are unable to keep an appointment. What may interact with this medicine? Interactions are not expected. Give your health care provider a list of all the medicines, herbs, non-prescription drugs, or dietary supplements you use. Also tell them if you smoke, drink alcohol, or use illegal drugs. Some items may interact with your medicine. This list may not describe all possible interactions. Give your health care provider a list of all the medicines, herbs, non-prescription drugs, or dietary supplements you use. Also tell them if you smoke, drink alcohol, or use illegal drugs. Some items may interact with your medicine. What should I watch for while using this medicine? Your condition will be monitored carefully while you are receiving this medicine. Report any side effects. Continue your course of treatment even though you feel ill unless your doctor tells you to stop. You may need blood work done while you are taking this medicine. Do not become pregnant while taking  this medicine or for at least 30 days after stopping it. Women should inform their doctor if they wish to become pregnant or think they might be pregnant. There is a potential for serious side effects to an unborn child. Men should not father a child while taking this medicine and for 90 days after stopping it. Talk to your health care professional or pharmacist for more information. Do not breast-feed an infant while taking  this medicine. Check with your doctor or health care professional if you get an attack of severe diarrhea, nausea and vomiting, or if you sweat a lot. The loss of too much body fluid can make it dangerous for you to take this medicine. You may get dizzy. Do not drive, use machinery, or do anything that needs mental alertness until you know how this medicine affects you. Do not stand or sit up quickly, especially if you are an older patient. This reduces the risk of dizzy or fainting spells. What side effects may I notice from receiving this medicine? Side effects that you should report to your doctor or health care professional as soon as possible: -allergic reactions like skin rash, itching or hives, swelling of the face, lips, or tongue -confusion -dizziness -feeling faint or lightheaded -fever or chills -palpitations -seizures -signs and symptoms of bleeding such as bloody or black, tarry stools; red or dark-brown urine; spitting up blood or brown material that looks like coffee grounds; red spots on the skin; unusual bruising or bleeding including from the eye, gums, or nose -signs and symptoms of a blood clot such as breathing problems; changes in vision; chest pain; severe, sudden headache; pain, swelling, warmth in the leg; trouble speaking; sudden numbness or weakness of the face, arm or leg -signs and symptoms of kidney injury like trouble passing urine or change in the amount of urine -signs and symptoms of liver injury like dark yellow or brown urine; general ill feeling or flu-like symptoms; light-colored stools; loss of appetite; nausea; right upper belly pain; unusually weak or tired; yellowing of the eyes or skin Side effects that usually do not require medical attention (report to your doctor or health care professional if they continue or are bothersome): -back pain -cough -diarrhea -headache -muscle cramps -vomiting This list may not describe all possible side effects. Call  your doctor for medical advice about side effects. You may report side effects to FDA at 1-800-FDA-1088. Where should I keep my medicine? This drug is given in a hospital or clinic and will not be stored at home. NOTE: This sheet is a summary. It may not cover all possible information. If you have questions about this medicine, talk to your doctor, pharmacist, or health care provider.  2018 Elsevier/Gold Standard (2015-08-31 13:39:23)

## 2016-10-31 NOTE — Progress Notes (Signed)
Hematology and Oncology Follow Up Visit  NGAI PARCELL [id number] 1929/11/25 83 y.o. 10/31/2016   Principle Diagnosis:   Recurrent IgG lambda myeloma - progressive  Hypercalcemia of malignancy.  Current Therapy:        Kyprolis/Cytoxan - s/p cycle #7 - Cytoxan dropped;Kyprolis q wk  Cytoxan 250mg  po q wk (3/1)/Ixazomib 4mg  po q week (3/1) - s/p cycle #4 - progression on 04/05/2016  Zometa 3.3 mg IV q. 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  Gellert is back for followup. He is doing okay. He is tolerated the Kyprolis very nicely. We basically Jamon weekly therapy. He's done well with this. His last M spike was down to 0.1 g/dL. His IgG level was 356 mg/dL. His lambda light chain was 2.1 mg/dL.  He does have the inguinal hernia on the right side. This is caused a little bit difficulty but he otherwise really has had very little difficulty with this. He's had no problems with bowels or bladder. He's had no abdominal pain. He has had no leg swelling. He does see his urologist next week.   He does have the numbness in the feet. This is about the same. I think that he will always have this. 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 This has been treated in the past with radiation.  His wife seems be doing pretty well. He wants to try to help out as much as he can. She has bad arthritis in that he is confined to a wheelchair.   Overall, his performance status is ECOG 1.   Medications:  Current Outpatient Prescriptions:  .  amLODipine-benazepril (LOTREL) 5-10 MG capsule, Take 1 capsule by mouth daily., Disp: 30 capsule, Rfl: 5 .  aspirin EC 81 MG tablet, Take 81 mg by mouth daily. , Disp: , Rfl:  .  Calcium Carb-Cholecalciferol (CALCIUM 600 + D PO), Take by mouth every morning., Disp: , Rfl:  .  Cyanocobalamin (VITAMIN B 12 PO), Take 1,000 mcg by  mouth every morning. , Disp: , Rfl:  .  HYDROcodone-acetaminophen (NORCO/VICODIN) 5-325 MG tablet, Take 1 tablet by mouth every 6 (six) hours as needed for moderate pain., Disp: 60 tablet, Rfl: 0 .  LORazepam (ATIVAN) 0.5 MG tablet, Take 1 tablet (0.5 mg total) by mouth every 6 (six) hours as needed (Nausea or vomiting)., Disp: 30 tablet, Rfl: 0 .  Multiple Vitamins-Minerals (MULTIVITAL PO), Take 1 tablet by mouth every morning. , Disp: , Rfl:  .  ondansetron (ZOFRAN) 8 MG tablet, Take 1 tablet (8 mg total) by mouth 2 (two) times daily as needed for refractory nausea / vomiting. Take as needed on days 4-7, 11-14, 18-28., Disp: 30 tablet, Rfl: 1 .  prochlorperazine (COMPAZINE) 10 MG tablet, Take 1 tablet (10 mg total) by mouth every 6 (six) hours as needed (Nausea or vomiting)., Disp: 30 tablet, Rfl: 1 .  Pyridoxine HCl (VITAMIN B-6) 250 MG tablet, Take 500 mg by mouth daily., Disp: , Rfl:  .  Vitamin D, Ergocalciferol, (DRISDOL) 50000 UNITS CAPS capsule, Take 50,000 Units by mouth every 7 (seven) days. Sundays, Disp: , Rfl:   Allergies:  Allergies  Allergen Reactions  . Sulfa Antibiotics Itching    Past Medical History, Surgical history, Social history, and Family History were reviewed and updated.  Review of Systems: As above  Physical Exam:  weight  is 142 lb (64.4 kg). His oral temperature is 97.7 F (36.5 C). His blood pressure is 174/75 (abnormal) and his pulse is 69. His respiration is 16 and oxygen saturation is 98%.   Thin, elderly gentleman. He is no distress. Head and neck exam shows no ocular or oral lesions. He has no palpable cervical or supraclavicular lymph nodes. Lungs are clear bilaterally. Cardiac exam is regular rate and rhythm with no murmurs rubs or bruits. Abdomen is soft. He has good bowel sounds. There is no fluid wave. There is no palpable liver or spleen. Back exam no tenderness over the spine ribs or hips. He has some kyphosis. Extremities shows some age related  changes. He has good range of motion and strength. Skin exam shows no rashes. Neurological exam is nonfocal.  Lab Results  Component Value Date   WBC 4.8 10/31/2016   HGB 9.1 (L) 10/31/2016   HCT 26.2 (L) 10/31/2016   MCV 97 10/31/2016   PLT 157 10/31/2016     Chemistry      Component Value Date/Time   NA 144 10/31/2016 1043   K 4.1 10/31/2016 1043   CL 106 10/31/2016 1043   CO2 26 10/31/2016 1043   BUN 21 10/31/2016 1043   CREATININE 1.6 (H) 10/31/2016 1043      Component Value Date/Time   CALCIUM 8.8 10/31/2016 1043   ALKPHOS 88 (H) 10/31/2016 1043   AST 24 10/31/2016 1043   ALT 20 10/31/2016 1043   BILITOT 1.20 10/31/2016 1043       Impression and Plan: Mr. Grotz is an 83 year old gentleman with IgG lambda myeloma.   I will continue him on the weekly Kyprolis. If he continues to stabilize, then hopefully we can get him on every other week treatment.  He is quite anemic. However, he is not symptomatically with this. His erythropoietin level is quite low. We can always use Aranesp on him. However, we have to be cautious in doing this with his blood pressure.  We will plan to get him back to see [place] in another 4 weeks.  I'm just glad that he is done well with weekly therapy so that he can be home with his wife. It would be really nice if he go on to every 2 week therapy with the warmer weather coming. That way, he can really be able to help his poor wife.   [name], MD 3/22/201811:39 AM

## 2016-11-01 LAB — KAPPA/LAMBDA LIGHT CHAINS
IG LAMBDA FREE LIGHT CHAIN: 27.2 mg/L — AB (ref 5.7–26.3)
Ig Kappa Free Light Chain: 15.9 mg/L (ref 3.3–19.4)
Kappa/Lambda FluidC Ratio: 0.58 (ref 0.26–1.65)

## 2016-11-01 LAB — IGG, IGA, IGM
IGA/IMMUNOGLOBULIN A, SERUM: 51 mg/dL — AB (ref 61–437)
IGG (IMMUNOGLOBIN G), SERUM: 317 mg/dL — AB (ref 700–1600)
IGM (IMMUNOGLOBIN M), SRM: 20 mg/dL (ref 15–143)

## 2016-11-05 LAB — PROTEIN ELECTROPHORESIS, SERUM, WITH REFLEX
A/G RATIO SPE: 1.5 (ref 0.7–1.7)
Albumin: 3.1 g/dL (ref 2.9–4.4)
Alpha 1: 0.3 g/dL (ref 0.0–0.4)
Alpha 2: 0.7 g/dL (ref 0.4–1.0)
Beta: 0.8 g/dL (ref 0.7–1.3)
GAMMA GLOBULIN: 0.3 g/dL — AB (ref 0.4–1.8)
Globulin, Total: 2.1 g/dL — ABNORMAL LOW (ref 2.2–3.9)
Interpretation(See Below): 0
M-SPIKE, %: 0.1 g/dL — AB
Total Protein: 5.2 g/dL — ABNORMAL LOW (ref 6.0–8.5)

## 2016-11-06 DIAGNOSIS — R972 Elevated prostate specific antigen [PSA]: Secondary | ICD-10-CM | POA: Diagnosis not present

## 2016-11-07 ENCOUNTER — Ambulatory Visit (HOSPITAL_BASED_OUTPATIENT_CLINIC_OR_DEPARTMENT_OTHER): Payer: Medicare HMO

## 2016-11-07 ENCOUNTER — Other Ambulatory Visit (HOSPITAL_BASED_OUTPATIENT_CLINIC_OR_DEPARTMENT_OTHER): Payer: Medicare HMO

## 2016-11-07 VITALS — BP 139/72 | HR 74 | Temp 97.2°F | Resp 18

## 2016-11-07 DIAGNOSIS — C9002 Multiple myeloma in relapse: Secondary | ICD-10-CM | POA: Diagnosis not present

## 2016-11-07 DIAGNOSIS — C9000 Multiple myeloma not having achieved remission: Secondary | ICD-10-CM | POA: Diagnosis not present

## 2016-11-07 DIAGNOSIS — Z5112 Encounter for antineoplastic immunotherapy: Secondary | ICD-10-CM

## 2016-11-07 LAB — CBC WITH DIFFERENTIAL (CANCER CENTER ONLY)
BASO#: 0 10*3/uL (ref 0.0–0.2)
BASO%: 0.4 % (ref 0.0–2.0)
EOS ABS: 0.3 10*3/uL (ref 0.0–0.5)
EOS%: 5.4 % (ref 0.0–7.0)
HCT: 25.9 % — ABNORMAL LOW (ref 38.7–49.9)
HGB: 9.1 g/dL — ABNORMAL LOW (ref 13.0–17.1)
LYMPH#: 0.4 10*3/uL — AB (ref 0.9–3.3)
LYMPH%: 8.6 % — ABNORMAL LOW (ref 14.0–48.0)
MCH: 33.6 pg — AB (ref 28.0–33.4)
MCHC: 35.1 g/dL (ref 32.0–35.9)
MCV: 96 fL (ref 82–98)
MONO#: 0.7 10*3/uL (ref 0.1–0.9)
MONO%: 14.2 % — ABNORMAL HIGH (ref 0.0–13.0)
NEUT#: 3.4 10*3/uL (ref 1.5–6.5)
NEUT%: 71.4 % (ref 40.0–80.0)
Platelets: 150 10*3/uL (ref 145–400)
RBC: 2.71 10*6/uL — ABNORMAL LOW (ref 4.20–5.70)
RDW: 13 % (ref 11.1–15.7)
WBC: 4.8 10*3/uL (ref 4.0–10.0)

## 2016-11-07 LAB — CMP (CANCER CENTER ONLY)
ALBUMIN: 3 g/dL — AB (ref 3.3–5.5)
ALK PHOS: 86 U/L — AB (ref 26–84)
ALT: 21 U/L (ref 10–47)
AST: 26 U/L (ref 11–38)
BILIRUBIN TOTAL: 1.2 mg/dL (ref 0.20–1.60)
BUN, Bld: 28 mg/dL — ABNORMAL HIGH (ref 7–22)
CALCIUM: 8.8 mg/dL (ref 8.0–10.3)
CO2: 25 mEq/L (ref 18–33)
Chloride: 108 mEq/L (ref 98–108)
Creat: 1.8 mg/dl — ABNORMAL HIGH (ref 0.6–1.2)
GLUCOSE: 123 mg/dL — AB (ref 73–118)
Potassium: 3.7 mEq/L (ref 3.3–4.7)
Sodium: 144 mEq/L (ref 128–145)
Total Protein: 5.5 g/dL — ABNORMAL LOW (ref 6.4–8.1)

## 2016-11-07 MED ORDER — SODIUM CHLORIDE 0.9 % IV SOLN
Freq: Once | INTRAVENOUS | Status: AC
  Administered 2016-11-07: 11:00:00 via INTRAVENOUS

## 2016-11-07 MED ORDER — DEXAMETHASONE SODIUM PHOSPHATE 10 MG/ML IJ SOLN
10.0000 mg | Freq: Once | INTRAMUSCULAR | Status: AC
  Administered 2016-11-07: 10 mg via INTRAVENOUS

## 2016-11-07 MED ORDER — DEXAMETHASONE SODIUM PHOSPHATE 10 MG/ML IJ SOLN
INTRAMUSCULAR | Status: AC
Start: 2016-11-07 — End: 2016-11-07
  Filled 2016-11-07: qty 1

## 2016-11-07 MED ORDER — DEXTROSE 5 % IV SOLN
60.0000 mg | Freq: Once | INTRAVENOUS | Status: AC
  Administered 2016-11-07: 60 mg via INTRAVENOUS
  Filled 2016-11-07: qty 30

## 2016-11-07 NOTE — Patient Instructions (Signed)
Carfilzomib injection What is this medicine? CARFILZOMIB (kar FILZ oh mib) targets a specific protein within cancer cells and stops the cancer cells from growing. It is used to treat multiple myeloma. This medicine may be used for other purposes; ask your health care provider or pharmacist if you have questions. COMMON BRAND NAME(S): KYPROLIS What should I tell my health care provider before I take this medicine? They need to know if you have any of these conditions: -heart disease -history of blood clots -irregular heartbeat -kidney disease -liver disease -lung or breathing disease -an unusual or allergic reaction to carfilzomib, or other medicines, foods, dyes, or preservatives -pregnant or trying to get pregnant -breast-feeding How should I use this medicine? This medicine is for injection or infusion into a vein. It is given by a health care professional in a hospital or clinic setting. Talk to your pediatrician regarding the use of this medicine in children. Special care may be needed. Overdosage: If you think you have taken too much of this medicine contact a poison control center or emergency room at once. NOTE: This medicine is only for you. Do not share this medicine with others. What if I miss a dose? It is important not to miss your dose. Call your doctor or health care professional if you are unable to keep an appointment. What may interact with this medicine? Interactions are not expected. Give your health care provider a list of all the medicines, herbs, non-prescription drugs, or dietary supplements you use. Also tell them if you smoke, drink alcohol, or use illegal drugs. Some items may interact with your medicine. This list may not describe all possible interactions. Give your health care provider a list of all the medicines, herbs, non-prescription drugs, or dietary supplements you use. Also tell them if you smoke, drink alcohol, or use illegal drugs. Some items may  interact with your medicine. What should I watch for while using this medicine? Your condition will be monitored carefully while you are receiving this medicine. Report any side effects. Continue your course of treatment even though you feel ill unless your doctor tells you to stop. You may need blood work done while you are taking this medicine. Do not become pregnant while taking this medicine or for at least 30 days after stopping it. Women should inform their doctor if they wish to become pregnant or think they might be pregnant. There is a potential for serious side effects to an unborn child. Men should not father a child while taking this medicine and for 90 days after stopping it. Talk to your health care professional or pharmacist for more information. Do not breast-feed an infant while taking this medicine. Check with your doctor or health care professional if you get an attack of severe diarrhea, nausea and vomiting, or if you sweat a lot. The loss of too much body fluid can make it dangerous for you to take this medicine. You may get dizzy. Do not drive, use machinery, or do anything that needs mental alertness until you know how this medicine affects you. Do not stand or sit up quickly, especially if you are an older patient. This reduces the risk of dizzy or fainting spells. What side effects may I notice from receiving this medicine? Side effects that you should report to your doctor or health care professional as soon as possible: -allergic reactions like skin rash, itching or hives, swelling of the face, lips, or tongue -confusion -dizziness -feeling faint or lightheaded -fever or chills -  palpitations -seizures -signs and symptoms of bleeding such as bloody or black, tarry stools; red or dark-brown urine; spitting up blood or brown material that looks like coffee grounds; red spots on the skin; unusual bruising or bleeding including from the eye, gums, or nose -signs and symptoms of  a blood clot such as breathing problems; changes in vision; chest pain; severe, sudden headache; pain, swelling, warmth in the leg; trouble speaking; sudden numbness or weakness of the face, arm or leg -signs and symptoms of kidney injury like trouble passing urine or change in the amount of urine -signs and symptoms of liver injury like dark yellow or brown urine; general ill feeling or flu-like symptoms; light-colored stools; loss of appetite; nausea; right upper belly pain; unusually weak or tired; yellowing of the eyes or skin Side effects that usually do not require medical attention (report to your doctor or health care professional if they continue or are bothersome): -back pain -cough -diarrhea -headache -muscle cramps -vomiting This list may not describe all possible side effects. Call your doctor for medical advice about side effects. You may report side effects to FDA at 1-800-FDA-1088. Where should I keep my medicine? This drug is given in a hospital or clinic and will not be stored at home. NOTE: This sheet is a summary. It may not cover all possible information. If you have questions about this medicine, talk to your doctor, pharmacist, or health care provider.  2018 Elsevier/Gold Standard (2015-08-31 13:39:23)  

## 2016-11-13 DIAGNOSIS — R350 Frequency of micturition: Secondary | ICD-10-CM | POA: Diagnosis not present

## 2016-11-13 DIAGNOSIS — N480 Leukoplakia of penis: Secondary | ICD-10-CM | POA: Diagnosis not present

## 2016-11-13 DIAGNOSIS — R972 Elevated prostate specific antigen [PSA]: Secondary | ICD-10-CM | POA: Diagnosis not present

## 2016-11-13 DIAGNOSIS — N401 Enlarged prostate with lower urinary tract symptoms: Secondary | ICD-10-CM | POA: Diagnosis not present

## 2016-11-14 ENCOUNTER — Ambulatory Visit (HOSPITAL_BASED_OUTPATIENT_CLINIC_OR_DEPARTMENT_OTHER): Payer: Medicare HMO

## 2016-11-14 ENCOUNTER — Other Ambulatory Visit (HOSPITAL_BASED_OUTPATIENT_CLINIC_OR_DEPARTMENT_OTHER): Payer: Medicare HMO

## 2016-11-14 VITALS — BP 159/77 | HR 66 | Temp 97.3°F | Resp 17

## 2016-11-14 DIAGNOSIS — C9002 Multiple myeloma in relapse: Secondary | ICD-10-CM | POA: Diagnosis not present

## 2016-11-14 DIAGNOSIS — Z5112 Encounter for antineoplastic immunotherapy: Secondary | ICD-10-CM | POA: Diagnosis not present

## 2016-11-14 DIAGNOSIS — C9000 Multiple myeloma not having achieved remission: Secondary | ICD-10-CM

## 2016-11-14 LAB — CMP (CANCER CENTER ONLY)
ALBUMIN: 2.9 g/dL — AB (ref 3.3–5.5)
ALT(SGPT): 18 U/L (ref 10–47)
AST: 24 U/L (ref 11–38)
Alkaline Phosphatase: 80 U/L (ref 26–84)
BUN, Bld: 26 mg/dL — ABNORMAL HIGH (ref 7–22)
CHLORIDE: 109 meq/L — AB (ref 98–108)
CO2: 26 meq/L (ref 18–33)
CREATININE: 1.6 mg/dL — AB (ref 0.6–1.2)
Calcium: 8.7 mg/dL (ref 8.0–10.3)
Glucose, Bld: 130 mg/dL — ABNORMAL HIGH (ref 73–118)
POTASSIUM: 4 meq/L (ref 3.3–4.7)
SODIUM: 143 meq/L (ref 128–145)
TOTAL PROTEIN: 5.3 g/dL — AB (ref 6.4–8.1)
Total Bilirubin: 1.3 mg/dl (ref 0.20–1.60)

## 2016-11-14 LAB — CBC WITH DIFFERENTIAL (CANCER CENTER ONLY)
BASO#: 0 10*3/uL (ref 0.0–0.2)
BASO%: 0.4 % (ref 0.0–2.0)
EOS ABS: 0.2 10*3/uL (ref 0.0–0.5)
EOS%: 4.7 % (ref 0.0–7.0)
HCT: 25.4 % — ABNORMAL LOW (ref 38.7–49.9)
HEMOGLOBIN: 8.9 g/dL — AB (ref 13.0–17.1)
LYMPH#: 0.4 10*3/uL — ABNORMAL LOW (ref 0.9–3.3)
LYMPH%: 7.9 % — AB (ref 14.0–48.0)
MCH: 33.6 pg — AB (ref 28.0–33.4)
MCHC: 35 g/dL (ref 32.0–35.9)
MCV: 96 fL (ref 82–98)
MONO#: 0.6 10*3/uL (ref 0.1–0.9)
MONO%: 12.7 % (ref 0.0–13.0)
NEUT%: 74.3 % (ref 40.0–80.0)
NEUTROS ABS: 3.5 10*3/uL (ref 1.5–6.5)
Platelets: 167 10*3/uL (ref 145–400)
RBC: 2.65 10*6/uL — ABNORMAL LOW (ref 4.20–5.70)
RDW: 13.2 % (ref 11.1–15.7)
WBC: 4.7 10*3/uL (ref 4.0–10.0)

## 2016-11-14 MED ORDER — DEXAMETHASONE SODIUM PHOSPHATE 10 MG/ML IJ SOLN
10.0000 mg | Freq: Once | INTRAMUSCULAR | Status: AC
Start: 2016-11-14 — End: 2016-11-14
  Administered 2016-11-14: 10 mg via INTRAVENOUS

## 2016-11-14 MED ORDER — SODIUM CHLORIDE 0.9 % IV SOLN: Freq: Once | INTRAVENOUS | Status: DC

## 2016-11-14 MED ORDER — DEXAMETHASONE SODIUM PHOSPHATE 10 MG/ML IJ SOLN
INTRAMUSCULAR | Status: AC
  Filled 2016-11-14: qty 1

## 2016-11-14 MED ORDER — SODIUM CHLORIDE 0.9 % IV SOLN
Freq: Once | INTRAVENOUS | Status: AC
  Administered 2016-11-14: 11:00:00 via INTRAVENOUS

## 2016-11-14 MED ORDER — DEXTROSE 5 % IV SOLN
60.0000 mg | Freq: Once | INTRAVENOUS | Status: AC
  Administered 2016-11-14: 60 mg via INTRAVENOUS
  Filled 2016-11-14: qty 30

## 2016-11-14 NOTE — Progress Notes (Signed)
Reviewed pt labs with Dr. Marin Olp (CMP) and ok to treat with creatinine of 1.6.

## 2016-11-14 NOTE — Patient Instructions (Signed)
[facility] Discharge Instructions for Patients Receiving Chemotherapy  Today you received the following chemotherapy agents: Kyprolis   To help prevent nausea and vomiting after your treatment, we encourage you to take your nausea medication as directed.    If you develop nausea and vomiting that is not controlled by your nausea medication, call the clinic.   BELOW ARE SYMPTOMS THAT SHOULD BE REPORTED IMMEDIATELY:  *FEVER GREATER THAN 100.5 F  *CHILLS WITH OR WITHOUT FEVER  NAUSEA AND VOMITING THAT IS NOT CONTROLLED WITH YOUR NAUSEA MEDICATION  *UNUSUAL SHORTNESS OF BREATH  *UNUSUAL BRUISING OR BLEEDING  TENDERNESS IN MOUTH AND THROAT WITH OR WITHOUT PRESENCE OF ULCERS  *URINARY PROBLEMS  *BOWEL PROBLEMS  UNUSUAL RASH Items with * indicate a potential emergency and should be followed up as soon as possible.  Feel free to call the clinic you have any questions or concerns. The clinic phone number is [phone].  Please show the CHEMO ALERT CARD at check-in to the Emergency Department and triage nurse.   

## 2016-11-21 DIAGNOSIS — C44319 Basal cell carcinoma of skin of other parts of face: Secondary | ICD-10-CM | POA: Diagnosis not present

## 2016-11-28 ENCOUNTER — Other Ambulatory Visit (HOSPITAL_BASED_OUTPATIENT_CLINIC_OR_DEPARTMENT_OTHER): Payer: Medicare HMO

## 2016-11-28 ENCOUNTER — Ambulatory Visit (HOSPITAL_BASED_OUTPATIENT_CLINIC_OR_DEPARTMENT_OTHER): Payer: Medicare HMO

## 2016-11-28 ENCOUNTER — Ambulatory Visit (HOSPITAL_BASED_OUTPATIENT_CLINIC_OR_DEPARTMENT_OTHER): Payer: Medicare HMO | Admitting: Hematology & Oncology

## 2016-11-28 VITALS — BP 150/83 | HR 73 | Temp 98.1°F | Resp 18 | Wt 139.1 lb

## 2016-11-28 DIAGNOSIS — C9001 Multiple myeloma in remission: Secondary | ICD-10-CM

## 2016-11-28 DIAGNOSIS — C9002 Multiple myeloma in relapse: Secondary | ICD-10-CM

## 2016-11-28 DIAGNOSIS — Z5112 Encounter for antineoplastic immunotherapy: Secondary | ICD-10-CM

## 2016-11-28 DIAGNOSIS — C9000 Multiple myeloma not having achieved remission: Secondary | ICD-10-CM

## 2016-11-28 LAB — CBC WITH DIFFERENTIAL (CANCER CENTER ONLY)
BASO#: 0 10*3/uL (ref 0.0–0.2)
BASO%: 0.8 % (ref 0.0–2.0)
EOS%: 5 % (ref 0.0–7.0)
Eosinophils Absolute: 0.3 10*3/uL (ref 0.0–0.5)
HCT: 26.7 % — ABNORMAL LOW (ref 38.7–49.9)
HGB: 9.3 g/dL — ABNORMAL LOW (ref 13.0–17.1)
LYMPH#: 0.3 10*3/uL — ABNORMAL LOW (ref 0.9–3.3)
LYMPH%: 6.5 % — AB (ref 14.0–48.0)
MCH: 33.7 pg — AB (ref 28.0–33.4)
MCHC: 34.8 g/dL (ref 32.0–35.9)
MCV: 97 fL (ref 82–98)
MONO#: 0.6 10*3/uL (ref 0.1–0.9)
MONO%: 12.3 % (ref 0.0–13.0)
NEUT#: 3.9 10*3/uL (ref 1.5–6.5)
NEUT%: 75.4 % (ref 40.0–80.0)
PLATELETS: 173 10*3/uL (ref 145–400)
RBC: 2.76 10*6/uL — ABNORMAL LOW (ref 4.20–5.70)
RDW: 13.1 % (ref 11.1–15.7)
WBC: 5.2 10*3/uL (ref 4.0–10.0)

## 2016-11-28 LAB — CMP (CANCER CENTER ONLY)
ALT: 28 U/L (ref 10–47)
AST: 26 U/L (ref 11–38)
Albumin: 3.2 g/dL — ABNORMAL LOW (ref 3.3–5.5)
Alkaline Phosphatase: 89 U/L — ABNORMAL HIGH (ref 26–84)
BUN: 27 mg/dL — AB (ref 7–22)
CHLORIDE: 111 meq/L — AB (ref 98–108)
CO2: 25 mEq/L (ref 18–33)
Calcium: 8.8 mg/dL (ref 8.0–10.3)
Creat: 1.6 mg/dl — ABNORMAL HIGH (ref 0.6–1.2)
GLUCOSE: 92 mg/dL (ref 73–118)
POTASSIUM: 4.2 meq/L (ref 3.3–4.7)
Sodium: 143 mEq/L (ref 128–145)
Total Bilirubin: 1.3 mg/dl (ref 0.20–1.60)
Total Protein: 5.9 g/dL — ABNORMAL LOW (ref 6.4–8.1)

## 2016-11-28 MED ORDER — DEXAMETHASONE SODIUM PHOSPHATE 10 MG/ML IJ SOLN
INTRAMUSCULAR | Status: AC
  Filled 2016-11-28: qty 1

## 2016-11-28 MED ORDER — DEXTROSE 5 % IV SOLN
60.0000 mg | Freq: Once | INTRAVENOUS | Status: AC
  Administered 2016-11-28: 60 mg via INTRAVENOUS
  Filled 2016-11-28: qty 30

## 2016-11-28 MED ORDER — DEXAMETHASONE SODIUM PHOSPHATE 10 MG/ML IJ SOLN
10.0000 mg | Freq: Once | INTRAMUSCULAR | Status: AC
  Administered 2016-11-28: 10 mg via INTRAVENOUS

## 2016-11-28 MED ORDER — SODIUM CHLORIDE 0.9 % IV SOLN
Freq: Once | INTRAVENOUS | Status: AC
Start: 2016-11-28 — End: 2016-11-28
  Administered 2016-11-28: 14:00:00 via INTRAVENOUS

## 2016-11-28 NOTE — Patient Instructions (Signed)
Carfilzomib injection What is this medicine? CARFILZOMIB (kar FILZ oh mib) targets a specific protein within cancer cells and stops the cancer cells from growing. It is used to treat multiple myeloma. This medicine may be used for other purposes; ask your health care provider or pharmacist if you have questions. COMMON BRAND NAME(S): KYPROLIS What should I tell my health care provider before I take this medicine? They need to know if you have any of these conditions: -heart disease -history of blood clots -irregular heartbeat -kidney disease -liver disease -lung or breathing disease -an unusual or allergic reaction to carfilzomib, or other medicines, foods, dyes, or preservatives -pregnant or trying to get pregnant -breast-feeding How should I use this medicine? This medicine is for injection or infusion into a vein. It is given by a health care professional in a hospital or clinic setting. Talk to your pediatrician regarding the use of this medicine in children. Special care may be needed. Overdosage: If you think you have taken too much of this medicine contact a poison control center or emergency room at once. NOTE: This medicine is only for you. Do not share this medicine with others. What if I miss a dose? It is important not to miss your dose. Call your doctor or health care professional if you are unable to keep an appointment. What may interact with this medicine? Interactions are not expected. Give your health care provider a list of all the medicines, herbs, non-prescription drugs, or dietary supplements you use. Also tell them if you smoke, drink alcohol, or use illegal drugs. Some items may interact with your medicine. This list may not describe all possible interactions. Give your health care provider a list of all the medicines, herbs, non-prescription drugs, or dietary supplements you use. Also tell them if you smoke, drink alcohol, or use illegal drugs. Some items may  interact with your medicine. What should I watch for while using this medicine? Your condition will be monitored carefully while you are receiving this medicine. Report any side effects. Continue your course of treatment even though you feel ill unless your doctor tells you to stop. You may need blood work done while you are taking this medicine. Do not become pregnant while taking this medicine or for at least 30 days after stopping it. Women should inform their doctor if they wish to become pregnant or think they might be pregnant. There is a potential for serious side effects to an unborn child. Men should not father a child while taking this medicine and for 90 days after stopping it. Talk to your health care professional or pharmacist for more information. Do not breast-feed an infant while taking this medicine. Check with your doctor or health care professional if you get an attack of severe diarrhea, nausea and vomiting, or if you sweat a lot. The loss of too much body fluid can make it dangerous for you to take this medicine. You may get dizzy. Do not drive, use machinery, or do anything that needs mental alertness until you know how this medicine affects you. Do not stand or sit up quickly, especially if you are an older patient. This reduces the risk of dizzy or fainting spells. What side effects may I notice from receiving this medicine? Side effects that you should report to your doctor or health care professional as soon as possible: -allergic reactions like skin rash, itching or hives, swelling of the face, lips, or tongue -confusion -dizziness -feeling faint or lightheaded -fever or chills -  palpitations -seizures -signs and symptoms of bleeding such as bloody or black, tarry stools; red or dark-brown urine; spitting up blood or brown material that looks like coffee grounds; red spots on the skin; unusual bruising or bleeding including from the eye, gums, or nose -signs and symptoms of  a blood clot such as breathing problems; changes in vision; chest pain; severe, sudden headache; pain, swelling, warmth in the leg; trouble speaking; sudden numbness or weakness of the face, arm or leg -signs and symptoms of kidney injury like trouble passing urine or change in the amount of urine -signs and symptoms of liver injury like dark yellow or brown urine; general ill feeling or flu-like symptoms; light-colored stools; loss of appetite; nausea; right upper belly pain; unusually weak or tired; yellowing of the eyes or skin Side effects that usually do not require medical attention (report to your doctor or health care professional if they continue or are bothersome): -back pain -cough -diarrhea -headache -muscle cramps -vomiting This list may not describe all possible side effects. Call your doctor for medical advice about side effects. You may report side effects to FDA at 1-800-FDA-1088. Where should I keep my medicine? This drug is given in a hospital or clinic and will not be stored at home. NOTE: This sheet is a summary. It may not cover all possible information. If you have questions about this medicine, talk to your doctor, pharmacist, or health care provider.  2018 Elsevier/Gold Standard (2015-08-31 13:39:23)  

## 2016-11-28 NOTE — Progress Notes (Signed)
Hematology and Oncology Follow Up Visit  MURRAY DURRELL [id number] 02-Oct-1929 83 y.o. 11/28/2016   Principle Diagnosis:   Recurrent IgG lambda myeloma - progressive  Hypercalcemia of malignancy.  Current Therapy:        Kyprolis/Cytoxan - s/p cycle #7 - Cytoxan dropped;Kyprolis q 2 wk  Cytoxan 250mg  po q wk (3/1)/Ixazomib 4mg  po q week (3/1) - s/p cycle #4 - progression on 04/05/2016  Zometa 3.3 mg IV q. 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  Edler is back for followup. He is doing okay. He is tolerated the Kyprolis very nicely. We basically Jamon weekly therapy. He's done well with this. His last M spike was down to 0.1 g/dL. His IgG level was 317 mg/dL. His lambda light chain was 2.7 mg/dL.  He does have the inguinal hernia on the right side. This is caused a little bit difficulty but he otherwise really has had very little difficulty with this. He's had no problems with bowels or bladder. He's had no abdominal pain. He has had no leg swelling. He does see his urologist next week.   He does have the numbness in the feet. This is about the same. I think that he will always have this. 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 This has been treated in the past with radiation.  His wife seems be doing pretty well. He wants to try to help out as much as he can. She has bad arthritis in that he is confined to a wheelchair.  He did have a nice Easter. He was spending Easter with his family at the assisted living center.   Overall, his performance status is ECOG 1.   Medications:  Current Outpatient Prescriptions:  .  amLODipine-benazepril (LOTREL) 5-10 MG capsule, Take 1 capsule by mouth daily., Disp: 30 capsule, Rfl: 5 .  aspirin EC 81 MG tablet, Take 81 mg by mouth daily. , Disp: , Rfl:  .  Calcium Carb-Cholecalciferol (CALCIUM 600 + D PO),  Take by mouth every morning., Disp: , Rfl:  .  Cyanocobalamin (VITAMIN B 12 PO), Take 1,000 mcg by mouth every morning. , Disp: , Rfl:  .  HYDROcodone-acetaminophen (NORCO/VICODIN) 5-325 MG tablet, Take 1 tablet by mouth every 6 (six) hours as needed for moderate pain., Disp: 60 tablet, Rfl: 0 .  LORazepam (ATIVAN) 0.5 MG tablet, Take 1 tablet (0.5 mg total) by mouth every 6 (six) hours as needed (Nausea or vomiting)., Disp: 30 tablet, Rfl: 0 .  Multiple Vitamins-Minerals (MULTIVITAL PO), Take 1 tablet by mouth every morning. , Disp: , Rfl:  .  ondansetron (ZOFRAN) 8 MG tablet, Take 1 tablet (8 mg total) by mouth 2 (two) times daily as needed for refractory nausea / vomiting. Take as needed on days 4-7, 11-14, 18-28., Disp: 30 tablet, Rfl: 1 .  prochlorperazine (COMPAZINE) 10 MG tablet, Take 1 tablet (10 mg total) by mouth every 6 (six) hours as needed (Nausea or vomiting)., Disp: 30 tablet, Rfl: 1 .  Pyridoxine HCl (VITAMIN B-6) 250 MG tablet, Take 500 mg by mouth daily., Disp: , Rfl:  .  Vitamin D, Ergocalciferol, (DRISDOL) 50000 UNITS CAPS capsule, Take 50,000 Units by mouth every 7 (seven) days. Sundays, Disp: , Rfl:   Allergies:  Allergies  Allergen Reactions  . Sulfa Antibiotics Itching    Past Medical History, Surgical history,  Social history, and Family History were reviewed and updated.  Review of Systems: As above  Physical Exam:  weight is 139 lb 1.9 oz (63.1 kg). His oral temperature is 98.1 F (36.7 C). His blood pressure is 150/83 (abnormal) and his pulse is 73. His respiration is 18 and oxygen saturation is 100%.   Thin, elderly gentleman. He is no distress. Head and neck exam shows no ocular or oral lesions. He has no palpable cervical or supraclavicular lymph nodes. Lungs are clear bilaterally. Cardiac exam is regular rate and rhythm with no murmurs rubs or bruits. Abdomen is soft. He has good bowel sounds. There is no fluid wave. There is no palpable liver or spleen. Back  exam no tenderness over the spine ribs or hips. He has some kyphosis. Extremities shows some age related changes. He has good range of motion and strength. Skin exam shows no rashes. Neurological exam is nonfocal.  Lab Results  Component Value Date   WBC 5.2 11/28/2016   HGB 9.3 (L) 11/28/2016   HCT 26.7 (L) 11/28/2016   MCV 97 11/28/2016   PLT 173 11/28/2016     Chemistry      Component Value Date/Time   NA 143 11/28/2016 1201   K 4.2 11/28/2016 1201   CL 111 (H) 11/28/2016 1201   CO2 25 11/28/2016 1201   BUN 27 (H) 11/28/2016 1201   CREATININE 1.6 (H) 11/28/2016 1201      Component Value Date/Time   CALCIUM 8.8 11/28/2016 1201   ALKPHOS 89 (H) 11/28/2016 1201   AST 26 11/28/2016 1201   ALT 28 11/28/2016 1201   BILITOT 1.30 11/28/2016 1201       Impression and Plan: Mr. Gorka is an 83 year old gentleman with IgG lambda myeloma.   I will change his Kyprolis to every 2 weeks now. I think this would be reasonable. It would allow him to spend more time with his wife.  I am just very happy that he has responded so well to treatment.  Hopefully, we can get his blood counts up a little bit better.  Again we can was use Aranesp if necessary.  I will plan to see him back in one month.     [name], MD 4/19/20181:20 PM

## 2016-11-29 LAB — KAPPA/LAMBDA LIGHT CHAINS
IG KAPPA FREE LIGHT CHAIN: 16.3 mg/L (ref 3.3–19.4)
Ig Lambda Free Light Chain: 27.7 mg/L — ABNORMAL HIGH (ref 5.7–26.3)
Kappa/Lambda FluidC Ratio: 0.59 (ref 0.26–1.65)

## 2016-11-29 LAB — IRON AND TIBC
%SAT: 41 % (ref 20–55)
Iron: 106 ug/dL (ref 42–163)
TIBC: 261 ug/dL (ref 202–409)
UIBC: 155 ug/dL (ref 117–376)

## 2016-11-29 LAB — ERYTHROPOIETIN: Erythropoietin: 15.2 m[IU]/mL (ref 2.6–18.5)

## 2016-11-29 LAB — IGG, IGA, IGM
IGG (IMMUNOGLOBIN G), SERUM: 358 mg/dL — AB (ref 700–1600)
IgA, Qn, Serum: 49 mg/dL — ABNORMAL LOW (ref 61–437)
IgM, Qn, Serum: 23 mg/dL (ref 15–143)

## 2016-11-29 LAB — FERRITIN: FERRITIN: 216 ng/mL (ref 22–316)

## 2016-12-03 LAB — PROTEIN ELECTROPHORESIS, SERUM, WITH REFLEX
A/G Ratio: 1.8 — ABNORMAL HIGH (ref 0.7–1.7)
Albumin: 3.6 g/dL (ref 2.9–4.4)
Alpha 1: 0.3 g/dL (ref 0.0–0.4)
Alpha 2: 0.7 g/dL (ref 0.4–1.0)
BETA: 0.7 g/dL (ref 0.7–1.3)
GAMMA GLOBULIN: 0.3 g/dL — AB (ref 0.4–1.8)
Globulin, Total: 2 g/dL — ABNORMAL LOW (ref 2.2–3.9)
Interpretation(See Below): 0
M-Spike, %: 0.1 g/dL — ABNORMAL HIGH
Total Protein: 5.6 g/dL — ABNORMAL LOW (ref 6.0–8.5)

## 2016-12-09 DIAGNOSIS — H1131 Conjunctival hemorrhage, right eye: Secondary | ICD-10-CM | POA: Diagnosis not present

## 2016-12-12 ENCOUNTER — Ambulatory Visit (HOSPITAL_BASED_OUTPATIENT_CLINIC_OR_DEPARTMENT_OTHER): Payer: Medicare HMO | Admitting: Family

## 2016-12-12 ENCOUNTER — Other Ambulatory Visit (HOSPITAL_BASED_OUTPATIENT_CLINIC_OR_DEPARTMENT_OTHER): Payer: Medicare HMO

## 2016-12-12 ENCOUNTER — Ambulatory Visit (HOSPITAL_BASED_OUTPATIENT_CLINIC_OR_DEPARTMENT_OTHER): Payer: Medicare HMO

## 2016-12-12 VITALS — BP 132/77 | HR 71 | Temp 97.8°F | Resp 16 | Wt 138.0 lb

## 2016-12-12 DIAGNOSIS — C9000 Multiple myeloma not having achieved remission: Secondary | ICD-10-CM

## 2016-12-12 DIAGNOSIS — C9002 Multiple myeloma in relapse: Secondary | ICD-10-CM

## 2016-12-12 DIAGNOSIS — Z5112 Encounter for antineoplastic immunotherapy: Secondary | ICD-10-CM

## 2016-12-12 DIAGNOSIS — C9001 Multiple myeloma in remission: Secondary | ICD-10-CM

## 2016-12-12 LAB — CBC WITH DIFFERENTIAL (CANCER CENTER ONLY)
BASO#: 0.1 10*3/uL (ref 0.0–0.2)
BASO%: 1 % (ref 0.0–2.0)
EOS%: 8.2 % — AB (ref 0.0–7.0)
Eosinophils Absolute: 0.4 10*3/uL (ref 0.0–0.5)
HEMATOCRIT: 25.8 % — AB (ref 38.7–49.9)
HGB: 9 g/dL — ABNORMAL LOW (ref 13.0–17.1)
LYMPH#: 0.4 10*3/uL — AB (ref 0.9–3.3)
LYMPH%: 7.8 % — AB (ref 14.0–48.0)
MCH: 33.6 pg — ABNORMAL HIGH (ref 28.0–33.4)
MCHC: 34.9 g/dL (ref 32.0–35.9)
MCV: 96 fL (ref 82–98)
MONO#: 0.5 10*3/uL (ref 0.1–0.9)
MONO%: 11.3 % (ref 0.0–13.0)
NEUT#: 3.4 10*3/uL (ref 1.5–6.5)
NEUT%: 71.7 % (ref 40.0–80.0)
PLATELETS: 171 10*3/uL (ref 145–400)
RBC: 2.68 10*6/uL — ABNORMAL LOW (ref 4.20–5.70)
RDW: 12.9 % (ref 11.1–15.7)
WBC: 4.8 10*3/uL (ref 4.0–10.0)

## 2016-12-12 LAB — CMP (CANCER CENTER ONLY)
ALT(SGPT): 21 U/L (ref 10–47)
AST: 22 U/L (ref 11–38)
Albumin: 3.1 g/dL — ABNORMAL LOW (ref 3.3–5.5)
Alkaline Phosphatase: 79 U/L (ref 26–84)
BUN: 30 mg/dL — AB (ref 7–22)
CALCIUM: 8.4 mg/dL (ref 8.0–10.3)
CO2: 26 meq/L (ref 18–33)
Chloride: 110 mEq/L — ABNORMAL HIGH (ref 98–108)
Creat: 1.6 mg/dl — ABNORMAL HIGH (ref 0.6–1.2)
GLUCOSE: 118 mg/dL (ref 73–118)
POTASSIUM: 4.1 meq/L (ref 3.3–4.7)
Sodium: 143 mEq/L (ref 128–145)
Total Bilirubin: 1.3 mg/dl (ref 0.20–1.60)
Total Protein: 5.8 g/dL — ABNORMAL LOW (ref 6.4–8.1)

## 2016-12-12 MED ORDER — SODIUM CHLORIDE 0.9 % IV SOLN: Freq: Once | INTRAVENOUS | Status: DC

## 2016-12-12 MED ORDER — DEXTROSE 5 % IV SOLN
60.0000 mg | Freq: Once | INTRAVENOUS | Status: AC
  Administered 2016-12-12: 60 mg via INTRAVENOUS
  Filled 2016-12-12: qty 30

## 2016-12-12 MED ORDER — SODIUM CHLORIDE 0.9 % IV SOLN
Freq: Once | INTRAVENOUS | Status: AC
  Administered 2016-12-12: 11:00:00 via INTRAVENOUS

## 2016-12-12 MED ORDER — DEXAMETHASONE SODIUM PHOSPHATE 10 MG/ML IJ SOLN
10.0000 mg | Freq: Once | INTRAMUSCULAR | Status: AC
Start: 2016-12-12 — End: 2016-12-12
  Administered 2016-12-12: 10 mg via INTRAVENOUS

## 2016-12-12 MED ORDER — DEXAMETHASONE SODIUM PHOSPHATE 10 MG/ML IJ SOLN
INTRAMUSCULAR | Status: AC
  Filled 2016-12-12: qty 1

## 2016-12-12 NOTE — Patient Instructions (Signed)
[facility] Discharge Instructions for Patients Receiving Chemotherapy  Today you received the following chemotherapy agents: Kyprolis   To help prevent nausea and vomiting after your treatment, we encourage you to take your nausea medication as directed.    If you develop nausea and vomiting that is not controlled by your nausea medication, call the clinic.   BELOW ARE SYMPTOMS THAT SHOULD BE REPORTED IMMEDIATELY:  *FEVER GREATER THAN 100.5 F  *CHILLS WITH OR WITHOUT FEVER  NAUSEA AND VOMITING THAT IS NOT CONTROLLED WITH YOUR NAUSEA MEDICATION  *UNUSUAL SHORTNESS OF BREATH  *UNUSUAL BRUISING OR BLEEDING  TENDERNESS IN MOUTH AND THROAT WITH OR WITHOUT PRESENCE OF ULCERS  *URINARY PROBLEMS  *BOWEL PROBLEMS  UNUSUAL RASH Items with * indicate a potential emergency and should be followed up as soon as possible.  Feel free to call the clinic you have any questions or concerns. The clinic phone number is [phone].  Please show the CHEMO ALERT CARD at check-in to the Emergency Department and triage nurse.   

## 2016-12-12 NOTE — Progress Notes (Signed)
Hematology and Oncology Follow Up Visit  ALAA EYERMAN [id number] 07/24/1930 83 y.o. 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 Chiang is here today for follow-up and treatment. He is doing fairly well but concerned that he has ost another pound. He will try drinking 2 boost a day along with his normal intake and see if this will help. He is staying well hydrated.  He has a broken blood vessel in the right eye and his sclera is red. He went to see his ophthalmologist earlier this week and states that they said "everything looked ok." They felt that this will slowly resolve on its own without intervention. There is no pain.  His M spike in April was 0.1 g/dL, IgG level was 358 mg/dL and lambda light chain was 27.7 mg/L. No fever, chills, n/v, cough, rash, dizziness, SOB, chest pain, palpitations, abdominal pain or changes in bowel or bladder habits.  No swelling, tenderness, numbness or tingling in his extremities. No c/o pain at this time.  He is enjoying getting out and enjoying the beautiful weather. He continues to make candy and brings some to our office on occasion. His parents had a candy shop in [place] when he was growing up. He enjoyed reminiscing during our visit today.    ECOG Performance Status: 1 - Symptomatic but completely ambulatory  Medications:  Allergies as of 12/12/2016      Reactions   Sulfa Antibiotics Itching      Medication List       Accurate as of 12/12/16 11:05 AM. Always use your most recent med list.          amLODipine-benazepril 5-10 MG  capsule Commonly known as:  LOTREL Take 1 capsule by mouth daily.   aspirin EC 81 MG tablet Take 81 mg by mouth daily.   CALCIUM 600 + D PO Take by mouth every morning.   HYDROcodone-acetaminophen 5-325 MG tablet Commonly known as:  NORCO/VICODIN Take 1 tablet by mouth every 6 (six) hours as needed for moderate pain.   LORazepam 0.5 MG tablet Commonly known as:  ATIVAN Take 1 tablet (0.5 mg total) by mouth every 6 (six) hours as needed (Nausea or vomiting).   MULTIVITAL PO Take 1 tablet by mouth every morning.   ondansetron 8 MG tablet Commonly known as:  ZOFRAN Take 1 tablet (8 mg total) by mouth 2 (two) times daily as needed for refractory nausea / vomiting. Take as needed on days 4-7, 11-14, 18-28.   prochlorperazine 10 MG tablet Commonly known as:  COMPAZINE Take 1 tablet (10 mg total) by mouth every 6 (six) hours as needed (Nausea or vomiting).   VITAMIN B 12 PO Take 1,000 mcg by mouth every morning.   vitamin B-6 250 MG tablet Take 500 mg by mouth daily.   Vitamin D (Ergocalciferol) 50000 units Caps capsule Commonly known as:  DRISDOL Take 50,000 Units by mouth every 7 (seven) days. Sundays       Allergies:  Allergies  Allergen Reactions  . Sulfa Antibiotics Itching    Past Medical History, Surgical history, Social history, and  Family History were reviewed and updated.  Review of Systems: All other 10 point review of systems is negative.   Physical Exam:  weight is 138 lb (62.6 kg). His oral temperature is 97.8 F (36.6 C). His blood pressure is 132/77 and his pulse is 71. His respiration is 16 and oxygen saturation is 100%.   Wt Readings from Last 3 Encounters:  12/12/16 138 lb (62.6 kg)  11/28/16 139 lb 1.9 oz (63.1 kg)  10/31/16 142 lb (64.4 kg)    Ocular: Sclerae unicteric, pupils equal, round and reactive to light Ear-nose-throat: Oropharynx clear, dentition fair Lymphatic: No cervical, supraclavicular or axillary adenopathy Lungs no rales  or rhonchi, good excursion bilaterally Heart regular rate and rhythm, no murmur appreciated Abd soft, nontender, positive bowel sounds, no liver or spleen tip palpated on exam, no fluid wave MSK no focal spinal tenderness, no joint edema, mild age related kyphosis  Neuro: non-focal, well-oriented, appropriate affect Breasts: Deferred   Lab Results  Component Value Date   WBC 4.8 12/12/2016   HGB 9.0 (L) 12/12/2016   HCT 25.8 (L) 12/12/2016   MCV 96 12/12/2016   PLT 171 12/12/2016   Lab Results  Component Value Date   FERRITIN 216 11/28/2016   IRON 106 11/28/2016   TIBC 261 11/28/2016   UIBC 155 11/28/2016   IRONPCTSAT 41 11/28/2016   Lab Results  Component Value Date   RBC 2.68 (L) 12/12/2016   Lab Results  Component Value Date   KPAFRELGTCHN 3.11 (H) 07/28/2015   LAMBDASER 37.90 (H) 07/28/2015   KAPLAMBRATIO 0.59 11/28/2016   Lab Results  Component Value Date   IGGSERUM 358 (L) 11/28/2016   IGA 204 07/28/2015   IGMSERUM 23 11/28/2016   Lab Results  Component Value Date   TOTALPROTELP 7.9 07/28/2015   ALBUMINELP 3.7 (L) 07/28/2015   A1GS 0.3 07/28/2015   A2GS 0.7 07/28/2015   BETS 0.4 07/28/2015   BETA2SER 0.3 07/28/2015   GAMS 2.5 (H) 07/28/2015   MSPIKE 0.1 (H) 11/28/2016   SPEI * 07/28/2015     Chemistry      Component Value Date/Time   NA 143 12/12/2016 0924   K 4.1 12/12/2016 0924   CL 110 (H) 12/12/2016 0924   CO2 26 12/12/2016 0924   BUN 30 (H) 12/12/2016 0924   CREATININE 1.6 (H) 12/12/2016 0924      Component Value Date/Time   CALCIUM 8.4 12/12/2016 0924   ALKPHOS 79 12/12/2016 0924   AST 22 12/12/2016 0924   ALT 21 12/12/2016 0924   BILITOT 1.30 12/12/2016 0924      Impression and Plan: Mr. Dieujuste is an 83 yo caucasian gentleman with IgG lambda myeloma. He is tolerating Kyprolis nicely so far. His myeloma studies have remained stable. He is staying busy assisting with his wife's care. He has no complaints at this time other than the  concern about weight loss. We will have him increase to 2-3 Boost a day.  His Hgb today is stable at 9.0 with an MCV of 96. Platelet count is 171.  We will proceed with treatment today as planned per Dr. Marin Olp.  We will plan to see him back again in two weeks for repeat lab work and follow-up.  He will contact our office with any question or concerns. We can certainly see him sooner if need be.  Eliezer Bottom, NP 5/3/201811:05 AM

## 2016-12-24 ENCOUNTER — Telehealth: Payer: Self-pay | Admitting: *Deleted

## 2016-12-24 NOTE — Telephone Encounter (Signed)
PAtient called stating that his mouth is very sore and the bone area of lower chin is very painful .  Is now having to take his hydrocodone for pain.  Dr. Marin Olp aware.  Dr. Marin Olp wants him to see his dentist ASAP.  Patient aware.

## 2016-12-26 ENCOUNTER — Ambulatory Visit (HOSPITAL_BASED_OUTPATIENT_CLINIC_OR_DEPARTMENT_OTHER): Payer: Medicare HMO

## 2016-12-26 ENCOUNTER — Ambulatory Visit (HOSPITAL_BASED_OUTPATIENT_CLINIC_OR_DEPARTMENT_OTHER): Payer: Medicare HMO | Admitting: Family

## 2016-12-26 ENCOUNTER — Other Ambulatory Visit (HOSPITAL_BASED_OUTPATIENT_CLINIC_OR_DEPARTMENT_OTHER): Payer: Medicare HMO

## 2016-12-26 VITALS — BP 132/72 | HR 79 | Temp 97.9°F | Resp 18 | Wt 141.8 lb

## 2016-12-26 DIAGNOSIS — C9002 Multiple myeloma in relapse: Secondary | ICD-10-CM | POA: Diagnosis not present

## 2016-12-26 DIAGNOSIS — C9000 Multiple myeloma not having achieved remission: Secondary | ICD-10-CM

## 2016-12-26 DIAGNOSIS — Z5112 Encounter for antineoplastic immunotherapy: Secondary | ICD-10-CM | POA: Diagnosis not present

## 2016-12-26 DIAGNOSIS — D508 Other iron deficiency anemias: Secondary | ICD-10-CM

## 2016-12-26 LAB — IRON AND TIBC
%SAT: 22 % (ref 20–55)
IRON: 52 ug/dL (ref 42–163)
TIBC: 231 ug/dL (ref 202–409)
UIBC: 179 ug/dL (ref 117–376)

## 2016-12-26 LAB — CBC WITH DIFFERENTIAL (CANCER CENTER ONLY)
BASO#: 0 10*3/uL (ref 0.0–0.2)
BASO%: 0.6 % (ref 0.0–2.0)
EOS ABS: 0.4 10*3/uL (ref 0.0–0.5)
EOS%: 7.9 % — AB (ref 0.0–7.0)
HEMATOCRIT: 24.9 % — AB (ref 38.7–49.9)
HEMOGLOBIN: 8.7 g/dL — AB (ref 13.0–17.1)
LYMPH#: 0.4 10*3/uL — AB (ref 0.9–3.3)
LYMPH%: 7.4 % — ABNORMAL LOW (ref 14.0–48.0)
MCH: 33.6 pg — AB (ref 28.0–33.4)
MCHC: 34.9 g/dL (ref 32.0–35.9)
MCV: 96 fL (ref 82–98)
MONO#: 0.6 10*3/uL (ref 0.1–0.9)
MONO%: 11.7 % (ref 0.0–13.0)
NEUT#: 3.8 10*3/uL (ref 1.5–6.5)
NEUT%: 72.4 % (ref 40.0–80.0)
Platelets: 167 10*3/uL (ref 145–400)
RBC: 2.59 10*6/uL — ABNORMAL LOW (ref 4.20–5.70)
RDW: 12.6 % (ref 11.1–15.7)
WBC: 5.3 10*3/uL (ref 4.0–10.0)

## 2016-12-26 LAB — CMP (CANCER CENTER ONLY)
ALBUMIN: 2.9 g/dL — AB (ref 3.3–5.5)
ALK PHOS: 81 U/L (ref 26–84)
ALT(SGPT): 18 U/L (ref 10–47)
AST: 23 U/L (ref 11–38)
BILIRUBIN TOTAL: 1 mg/dL (ref 0.20–1.60)
BUN, Bld: 25 mg/dL — ABNORMAL HIGH (ref 7–22)
CALCIUM: 8.5 mg/dL (ref 8.0–10.3)
CHLORIDE: 102 meq/L (ref 98–108)
CO2: 27 meq/L (ref 18–33)
Creat: 1.6 mg/dl — ABNORMAL HIGH (ref 0.6–1.2)
GLUCOSE: 137 mg/dL — AB (ref 73–118)
Potassium: 3.9 mEq/L (ref 3.3–4.7)
Sodium: 139 mEq/L (ref 128–145)
Total Protein: 5.7 g/dL — ABNORMAL LOW (ref 6.4–8.1)

## 2016-12-26 LAB — FERRITIN: FERRITIN: 179 ng/mL (ref 22–316)

## 2016-12-26 MED ORDER — DEXAMETHASONE SODIUM PHOSPHATE 10 MG/ML IJ SOLN
10.0000 mg | Freq: Once | INTRAMUSCULAR | Status: AC
  Administered 2016-12-26: 10 mg via INTRAVENOUS

## 2016-12-26 MED ORDER — DEXAMETHASONE SODIUM PHOSPHATE 10 MG/ML IJ SOLN
INTRAMUSCULAR | Status: AC
  Filled 2016-12-26: qty 1

## 2016-12-26 MED ORDER — SODIUM CHLORIDE 0.9 % IV SOLN
Freq: Once | INTRAVENOUS | Status: AC
  Administered 2016-12-26: 11:00:00 via INTRAVENOUS

## 2016-12-26 MED ORDER — SODIUM CHLORIDE 0.9 % IV SOLN: Freq: Once | INTRAVENOUS | Status: DC

## 2016-12-26 MED ORDER — DEXTROSE 5 % IV SOLN
60.0000 mg | Freq: Once | INTRAVENOUS | Status: AC
  Administered 2016-12-26: 60 mg via INTRAVENOUS
  Filled 2016-12-26: qty 30

## 2016-12-26 NOTE — Progress Notes (Signed)
Hematology and Oncology Follow Up Visit  Christian Burns [id number] 02-Nov-1929 83 y.o. 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 He has an infection from a tooth and is currently on Augmentin BID for the next 10 days. He was assessed by his dentist, Dr. Luretha Rued, and will be having his tooth pulled next week. I checked with Dr. Lindi Adie and we are ok to proceed with Kyprolis only today. He has not received Zometa since March.  Her has tenderness under the chin and cervical lymph nodes are slightly enlarged. No other lymphadenopathy found on exam.  In April, M-spike was 0.1 g/dL, IgG level was 358 mg/dL and 27.7 mg/L.  He has had some sinus drainage and pressure due to allergies.  He likes to take a nap every day from 4-5 pm prior to dinner. He enjoys this.  No fever, chills, n/v, cough, rash, dizziness, SOB, chest pain, palpitations, abdominal pain or changes in bowel or bladder habits.  No swelling, tenderness, numbness or tingling in his extremities. He has maintained a fairly good appetite and is drinking 2 boost a day. He is staying well hydrated. His weight is up 3 lbs since his last visit.   ECOG Performance Status: 1 - Symptomatic but completely ambulatory  Medications:  Allergies as of 12/26/2016      Reactions   Sulfa Antibiotics Itching      Medication List       Accurate as of 12/26/16  9:46 AM. Always use your most recent med list.          amLODipine-benazepril 5-10 MG capsule Commonly known as:  LOTREL Take 1  capsule by mouth daily.   aspirin EC 81 MG tablet Take 81 mg by mouth daily.   CALCIUM 600 + D PO Take by mouth every morning.   HYDROcodone-acetaminophen 5-325 MG tablet Commonly known as:  NORCO/VICODIN Take 1 tablet by mouth every 6 (six) hours as needed for moderate pain.   LORazepam 0.5 MG tablet Commonly known as:  ATIVAN Take 1 tablet (0.5 mg total) by mouth every 6 (six) hours as needed (Nausea or vomiting).   MULTIVITAL PO Take 1 tablet by mouth every morning.   ondansetron 8 MG tablet Commonly known as:  ZOFRAN Take 1 tablet (8 mg total) by mouth 2 (two) times daily as needed for refractory nausea / vomiting. Take as needed on days 4-7, 11-14, 18-28.   prochlorperazine 10 MG tablet Commonly known as:  COMPAZINE Take 1 tablet (10 mg total) by mouth every 6 (six) hours as needed (Nausea or vomiting).   VITAMIN B 12 PO Take 1,000 mcg by mouth every morning.   vitamin B-6 250 MG tablet Take 500 mg by mouth daily.   Vitamin D (Ergocalciferol) 50000 units Caps capsule Commonly known as:  DRISDOL Take 50,000 Units by mouth every 7 (seven) days. Sundays       Allergies:  Allergies  Allergen Reactions  . Sulfa Antibiotics Itching    Past Medical History, Surgical history, Social history, and Family History were reviewed and  updated.  Review of Systems: All other 10 point review of systems is negative.   Physical Exam:  vitals were not taken for this visit.  Wt Readings from Last 3 Encounters:  12/12/16 138 lb (62.6 kg)  11/28/16 139 lb 1.9 oz (63.1 kg)  10/31/16 142 lb (64.4 kg)    Ocular: Sclerae unicteric, pupils equal, round and reactive to light Ear-nose-throat: Oropharynx clear, dentition fair Lymphatic: No supraclavicular or axillary adenopathy, cervical lymph nodes mildly enlarged Lungs no rales or rhonchi, good excursion bilaterally Heart regular rate and rhythm, no murmur appreciated Abd soft, nontender, positive bowel sounds, no liver or  spleen tip palpated on exam, no fluid wave MSK no focal spinal tenderness, no joint edema Neuro: non-focal, well-oriented, appropriate affect Breasts: Deferred  Lab Results  Component Value Date   WBC 5.3 12/26/2016   HGB 8.7 (L) 12/26/2016   HCT 24.9 (L) 12/26/2016   MCV 96 12/26/2016   PLT 167 12/26/2016   Lab Results  Component Value Date   FERRITIN 216 11/28/2016   IRON 106 11/28/2016   TIBC 261 11/28/2016   UIBC 155 11/28/2016   IRONPCTSAT 41 11/28/2016   Lab Results  Component Value Date   RBC 2.59 (L) 12/26/2016   Lab Results  Component Value Date   KPAFRELGTCHN 3.11 (H) 07/28/2015   LAMBDASER 37.90 (H) 07/28/2015   KAPLAMBRATIO 0.59 11/28/2016   Lab Results  Component Value Date   IGGSERUM 358 (L) 11/28/2016   IGA 204 07/28/2015   IGMSERUM 23 11/28/2016   Lab Results  Component Value Date   TOTALPROTELP 7.9 07/28/2015   ALBUMINELP 3.7 (L) 07/28/2015   A1GS 0.3 07/28/2015   A2GS 0.7 07/28/2015   BETS 0.4 07/28/2015   BETA2SER 0.3 07/28/2015   GAMS 2.5 (H) 07/28/2015   MSPIKE 0.1 (H) 11/28/2016   SPEI * 07/28/2015     Chemistry      Component Value Date/Time   NA 143 12/12/2016 0924   K 4.1 12/12/2016 0924   CL 110 (H) 12/12/2016 0924   CO2 26 12/12/2016 0924   BUN 30 (H) 12/12/2016 0924   CREATININE 1.6 (H) 12/12/2016 0924      Component Value Date/Time   CALCIUM 8.4 12/12/2016 0924   ALKPHOS 79 12/12/2016 0924   AST 22 12/12/2016 0924   ALT 21 12/12/2016 0924   BILITOT 1.30 12/12/2016 0924      Impression and Plan: Christian Burns is a very pleasant 83 yo caucasian gentleman with IgG lmada myeloma. He has done well on Kyprolis so far and his myeloma counts have remained stable. We have rechecked these today.  He has an infection from a tooth that will be pulled next week by Dr. Luretha Rued. He is currently on Augmentin PO BID.  I spoke with Dr. Lindi Adie and we are ok to proceed with Kyprolis today as planned. He has not received in Zometa in 2 months and  will continue to hold.  We will plan to see him back in 1 month for follow-up. He will also get a treatment scheduled today with same regimen.  He will contact our office with any questions or concerns. We can certainly see him sooner if need be.   Eliezer Bottom, NP 5/17/20189:46 AM

## 2016-12-27 LAB — KAPPA/LAMBDA LIGHT CHAINS
Ig Kappa Free Light Chain: 17.1 mg/L (ref 3.3–19.4)
Ig Lambda Free Light Chain: 32.3 mg/L — ABNORMAL HIGH (ref 5.7–26.3)
Kappa/Lambda FluidC Ratio: 0.53 (ref 0.26–1.65)

## 2016-12-30 LAB — MULTIPLE MYELOMA PANEL, SERUM
Albumin SerPl Elph-Mcnc: 3.2 g/dL (ref 2.9–4.4)
Albumin/Glob SerPl: 1.7 (ref 0.7–1.7)
Alpha 1: 0.2 g/dL (ref 0.0–0.4)
Alpha2 Glob SerPl Elph-Mcnc: 0.8 g/dL (ref 0.4–1.0)
B-Globulin SerPl Elph-Mcnc: 0.7 g/dL (ref 0.7–1.3)
Gamma Glob SerPl Elph-Mcnc: 0.3 g/dL — ABNORMAL LOW (ref 0.4–1.8)
Globulin, Total: 2 g/dL — ABNORMAL LOW (ref 2.2–3.9)
IgA, Qn, Serum: 53 mg/dL — ABNORMAL LOW (ref 61–437)
IgG, Qn, Serum: 367 mg/dL — ABNORMAL LOW (ref 700–1600)
IgM, Qn, Serum: 20 mg/dL (ref 15–143)
M Protein SerPl Elph-Mcnc: 0.1 g/dL — ABNORMAL HIGH
Total Protein: 5.2 g/dL — ABNORMAL LOW (ref 6.0–8.5)

## 2017-01-09 ENCOUNTER — Ambulatory Visit (HOSPITAL_BASED_OUTPATIENT_CLINIC_OR_DEPARTMENT_OTHER): Payer: Medicare HMO

## 2017-01-09 DIAGNOSIS — C9000 Multiple myeloma not having achieved remission: Secondary | ICD-10-CM

## 2017-01-09 LAB — CBC WITH DIFFERENTIAL (CANCER CENTER ONLY)
BASO#: 0.1 10*3/uL (ref 0.0–0.2)
BASO%: 1 % (ref 0.0–2.0)
EOS%: 6.3 % (ref 0.0–7.0)
Eosinophils Absolute: 0.3 10*3/uL (ref 0.0–0.5)
HCT: 25.4 % — ABNORMAL LOW (ref 38.7–49.9)
HGB: 8.8 g/dL — ABNORMAL LOW (ref 13.0–17.1)
LYMPH#: 0.4 10*3/uL — ABNORMAL LOW (ref 0.9–3.3)
LYMPH%: 8.3 % — AB (ref 14.0–48.0)
MCH: 33.1 pg (ref 28.0–33.4)
MCHC: 34.6 g/dL (ref 32.0–35.9)
MCV: 96 fL (ref 82–98)
MONO#: 0.5 10*3/uL (ref 0.1–0.9)
MONO%: 10.2 % (ref 0.0–13.0)
NEUT#: 3.7 10*3/uL (ref 1.5–6.5)
NEUT%: 74.2 % (ref 40.0–80.0)
Platelets: 191 10*3/uL (ref 145–400)
RBC: 2.66 10*6/uL — AB (ref 4.20–5.70)
RDW: 12.5 % (ref 11.1–15.7)
WBC: 4.9 10*3/uL (ref 4.0–10.0)

## 2017-01-09 LAB — COMPREHENSIVE METABOLIC PANEL
ALT: 12 U/L (ref 0–55)
AST: 18 U/L (ref 5–34)
Albumin: 3.2 g/dL — ABNORMAL LOW (ref 3.5–5.0)
Alkaline Phosphatase: 80 U/L (ref 40–150)
Anion Gap: 7 mEq/L (ref 3–11)
BILIRUBIN TOTAL: 0.92 mg/dL (ref 0.20–1.20)
BUN: 23.9 mg/dL (ref 7.0–26.0)
CO2: 22 meq/L (ref 22–29)
CREATININE: 1.5 mg/dL — AB (ref 0.7–1.3)
Calcium: 8.8 mg/dL (ref 8.4–10.4)
Chloride: 113 mEq/L — ABNORMAL HIGH (ref 98–109)
EGFR: 42 mL/min/{1.73_m2} — AB (ref >90)
GLUCOSE: 139 mg/dL (ref 70–140)
Potassium: 4.4 mEq/L (ref 3.5–5.1)
SODIUM: 141 meq/L (ref 136–145)
TOTAL PROTEIN: 5.7 g/dL — AB (ref 6.4–8.3)

## 2017-01-10 ENCOUNTER — Ambulatory Visit (HOSPITAL_BASED_OUTPATIENT_CLINIC_OR_DEPARTMENT_OTHER): Payer: Medicare HMO

## 2017-01-10 ENCOUNTER — Other Ambulatory Visit: Payer: Medicare HMO

## 2017-01-10 VITALS — BP 146/71 | HR 66 | Temp 97.9°F

## 2017-01-10 DIAGNOSIS — C9000 Multiple myeloma not having achieved remission: Secondary | ICD-10-CM

## 2017-01-10 DIAGNOSIS — Z5112 Encounter for antineoplastic immunotherapy: Secondary | ICD-10-CM

## 2017-01-10 DIAGNOSIS — C9002 Multiple myeloma in relapse: Secondary | ICD-10-CM

## 2017-01-10 MED ORDER — DEXAMETHASONE SODIUM PHOSPHATE 10 MG/ML IJ SOLN
INTRAMUSCULAR | Status: AC
  Filled 2017-01-10: qty 1

## 2017-01-10 MED ORDER — SODIUM CHLORIDE 0.9 % IV SOLN
Freq: Once | INTRAVENOUS | Status: AC
  Administered 2017-01-10: 11:00:00 via INTRAVENOUS

## 2017-01-10 MED ORDER — DEXTROSE 5 % IV SOLN
60.0000 mg | Freq: Once | INTRAVENOUS | Status: AC
  Administered 2017-01-10: 60 mg via INTRAVENOUS
  Filled 2017-01-10: qty 30

## 2017-01-10 MED ORDER — DEXAMETHASONE SODIUM PHOSPHATE 10 MG/ML IJ SOLN
10.0000 mg | Freq: Once | INTRAMUSCULAR | Status: AC
  Administered 2017-01-10: 10 mg via INTRAVENOUS

## 2017-01-10 NOTE — Patient Instructions (Signed)
Carfilzomib injection What is this medicine? CARFILZOMIB (kar FILZ oh mib) targets a specific protein within cancer cells and stops the cancer cells from growing. It is used to treat multiple myeloma. This medicine may be used for other purposes; ask your health care provider or pharmacist if you have questions. COMMON BRAND NAME(S): KYPROLIS What should I tell my health care provider before I take this medicine? They need to know if you have any of these conditions: -heart disease -history of blood clots -irregular heartbeat -kidney disease -liver disease -lung or breathing disease -an unusual or allergic reaction to carfilzomib, or other medicines, foods, dyes, or preservatives -pregnant or trying to get pregnant -breast-feeding How should I use this medicine? This medicine is for injection or infusion into a vein. It is given by a health care professional in a hospital or clinic setting. Talk to your pediatrician regarding the use of this medicine in children. Special care may be needed. Overdosage: If you think you have taken too much of this medicine contact a poison control center or emergency room at once. NOTE: This medicine is only for you. Do not share this medicine with others. What if I miss a dose? It is important not to miss your dose. Call your doctor or health care professional if you are unable to keep an appointment. What may interact with this medicine? Interactions are not expected. Give your health care provider a list of all the medicines, herbs, non-prescription drugs, or dietary supplements you use. Also tell them if you smoke, drink alcohol, or use illegal drugs. Some items may interact with your medicine. This list may not describe all possible interactions. Give your health care provider a list of all the medicines, herbs, non-prescription drugs, or dietary supplements you use. Also tell them if you smoke, drink alcohol, or use illegal drugs. Some items may  interact with your medicine. What should I watch for while using this medicine? Your condition will be monitored carefully while you are receiving this medicine. Report any side effects. Continue your course of treatment even though you feel ill unless your doctor tells you to stop. You may need blood work done while you are taking this medicine. Do not become pregnant while taking this medicine or for at least 30 days after stopping it. Women should inform their doctor if they wish to become pregnant or think they might be pregnant. There is a potential for serious side effects to an unborn child. Men should not father a child while taking this medicine and for 90 days after stopping it. Talk to your health care professional or pharmacist for more information. Do not breast-feed an infant while taking this medicine. Check with your doctor or health care professional if you get an attack of severe diarrhea, nausea and vomiting, or if you sweat a lot. The loss of too much body fluid can make it dangerous for you to take this medicine. You may get dizzy. Do not drive, use machinery, or do anything that needs mental alertness until you know how this medicine affects you. Do not stand or sit up quickly, especially if you are an older patient. This reduces the risk of dizzy or fainting spells. What side effects may I notice from receiving this medicine? Side effects that you should report to your doctor or health care professional as soon as possible: -allergic reactions like skin rash, itching or hives, swelling of the face, lips, or tongue -confusion -dizziness -feeling faint or lightheaded -fever or chills -  palpitations -seizures -signs and symptoms of bleeding such as bloody or black, tarry stools; red or dark-brown urine; spitting up blood or brown material that looks like coffee grounds; red spots on the skin; unusual bruising or bleeding including from the eye, gums, or nose -signs and symptoms of  a blood clot such as breathing problems; changes in vision; chest pain; severe, sudden headache; pain, swelling, warmth in the leg; trouble speaking; sudden numbness or weakness of the face, arm or leg -signs and symptoms of kidney injury like trouble passing urine or change in the amount of urine -signs and symptoms of liver injury like dark yellow or brown urine; general ill feeling or flu-like symptoms; light-colored stools; loss of appetite; nausea; right upper belly pain; unusually weak or tired; yellowing of the eyes or skin Side effects that usually do not require medical attention (report to your doctor or health care professional if they continue or are bothersome): -back pain -cough -diarrhea -headache -muscle cramps -vomiting This list may not describe all possible side effects. Call your doctor for medical advice about side effects. You may report side effects to FDA at 1-800-FDA-1088. Where should I keep my medicine? This drug is given in a hospital or clinic and will not be stored at home. NOTE: This sheet is a summary. It may not cover all possible information. If you have questions about this medicine, talk to your doctor, pharmacist, or health care provider.  2018 Elsevier/Gold Standard (2015-08-31 13:39:23)  

## 2017-01-23 ENCOUNTER — Other Ambulatory Visit (HOSPITAL_BASED_OUTPATIENT_CLINIC_OR_DEPARTMENT_OTHER): Payer: Medicare HMO

## 2017-01-23 ENCOUNTER — Ambulatory Visit (HOSPITAL_BASED_OUTPATIENT_CLINIC_OR_DEPARTMENT_OTHER): Payer: Medicare HMO | Admitting: Hematology & Oncology

## 2017-01-23 ENCOUNTER — Ambulatory Visit (HOSPITAL_BASED_OUTPATIENT_CLINIC_OR_DEPARTMENT_OTHER): Payer: Medicare HMO

## 2017-01-23 DIAGNOSIS — Z5112 Encounter for antineoplastic immunotherapy: Secondary | ICD-10-CM | POA: Diagnosis not present

## 2017-01-23 DIAGNOSIS — C9000 Multiple myeloma not having achieved remission: Secondary | ICD-10-CM

## 2017-01-23 DIAGNOSIS — K5900 Constipation, unspecified: Secondary | ICD-10-CM

## 2017-01-23 DIAGNOSIS — C9002 Multiple myeloma in relapse: Secondary | ICD-10-CM | POA: Diagnosis not present

## 2017-01-23 DIAGNOSIS — D508 Other iron deficiency anemias: Secondary | ICD-10-CM | POA: Diagnosis not present

## 2017-01-23 LAB — CBC WITH DIFFERENTIAL (CANCER CENTER ONLY)
BASO#: 0 10*3/uL (ref 0.0–0.2)
BASO%: 0.5 % (ref 0.0–2.0)
EOS%: 2.6 % (ref 0.0–7.0)
Eosinophils Absolute: 0.2 10*3/uL (ref 0.0–0.5)
HEMATOCRIT: 27.3 % — AB (ref 38.7–49.9)
HEMOGLOBIN: 9.6 g/dL — AB (ref 13.0–17.1)
LYMPH#: 0.4 10*3/uL — AB (ref 0.9–3.3)
LYMPH%: 7 % — ABNORMAL LOW (ref 14.0–48.0)
MCH: 32.9 pg (ref 28.0–33.4)
MCHC: 35.2 g/dL (ref 32.0–35.9)
MCV: 94 fL (ref 82–98)
MONO#: 0.7 10*3/uL (ref 0.1–0.9)
MONO%: 11.9 % (ref 0.0–13.0)
NEUT#: 4.6 10*3/uL (ref 1.5–6.5)
NEUT%: 78 % (ref 40.0–80.0)
Platelets: 164 10*3/uL (ref 145–400)
RBC: 2.92 10*6/uL — ABNORMAL LOW (ref 4.20–5.70)
RDW: 12.8 % (ref 11.1–15.7)
WBC: 5.9 10*3/uL (ref 4.0–10.0)

## 2017-01-23 LAB — IRON AND TIBC
%SAT: 22 % (ref 20–55)
IRON: 54 ug/dL (ref 42–163)
TIBC: 243 ug/dL (ref 202–409)
UIBC: 188 ug/dL (ref 117–376)

## 2017-01-23 LAB — CMP (CANCER CENTER ONLY)
ALBUMIN: 3.2 g/dL — AB (ref 3.3–5.5)
ALT(SGPT): 18 U/L (ref 10–47)
AST: 26 U/L (ref 11–38)
Alkaline Phosphatase: 73 U/L (ref 26–84)
BUN, Bld: 16 mg/dL (ref 7–22)
CALCIUM: 9 mg/dL (ref 8.0–10.3)
CHLORIDE: 106 meq/L (ref 98–108)
CO2: 28 meq/L (ref 18–33)
Creat: 1.4 mg/dl — ABNORMAL HIGH (ref 0.6–1.2)
Glucose, Bld: 92 mg/dL (ref 73–118)
POTASSIUM: 3.9 meq/L (ref 3.3–4.7)
Sodium: 139 mEq/L (ref 128–145)
TOTAL PROTEIN: 5.9 g/dL — AB (ref 6.4–8.1)
Total Bilirubin: 1.2 mg/dl (ref 0.20–1.60)

## 2017-01-23 LAB — FERRITIN: FERRITIN: 175 ng/mL (ref 22–316)

## 2017-01-23 MED ORDER — DEXTROSE 5 % IV SOLN
60.0000 mg | Freq: Once | INTRAVENOUS | Status: AC
  Administered 2017-01-23: 60 mg via INTRAVENOUS
  Filled 2017-01-23: qty 30

## 2017-01-23 MED ORDER — HYDROCODONE-ACETAMINOPHEN 5-325 MG PO TABS: 1.0000 | ORAL_TABLET | Freq: Four times a day (QID) | ORAL | 0 refills | Status: DC | PRN

## 2017-01-23 MED ORDER — DEXAMETHASONE SODIUM PHOSPHATE 10 MG/ML IJ SOLN
10.0000 mg | Freq: Once | INTRAMUSCULAR | Status: AC
  Administered 2017-01-23: 10 mg via INTRAVENOUS

## 2017-01-23 MED ORDER — DEXAMETHASONE SODIUM PHOSPHATE 10 MG/ML IJ SOLN
INTRAMUSCULAR | Status: AC
  Filled 2017-01-23: qty 1

## 2017-01-23 MED ORDER — SODIUM CHLORIDE 0.9 % IV SOLN
Freq: Once | INTRAVENOUS | Status: AC
  Administered 2017-01-23: 12:00:00 via INTRAVENOUS

## 2017-01-23 MED FILL — HYDROCODON-APAP 5-325: 5-325 | 15 days supply | Qty: 60 | Fill #0

## 2017-01-23 NOTE — Progress Notes (Signed)
Hematology and Oncology Follow Up Burns  [name] [id number] Feb 05, 1930 83 y.o. 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 is here today for follow-up and treatment. Apparently, there are a lot of things going on at his home. His wife had to be hospitalized and now she is in rehabilitation. This is certainly causing quite a bit of trouble for him. This is somewhat stressful for him.  Thankfully, his myeloma has been doing quite well. Back in May his M spike was 0.1 g/dL. His IgG level was 367 mg/dL. His lambda light chain was 3.2 mg/dL.  He is having a lot of problems with his mouth. He is seen oral surgery. I'm unsure if he had a tooth extracted. He had a Panorex done. This did not show any obvious ostial necrosis. We have held his Zometa for several months.  Again, he just is under a lot of stress with his wife.  His weight is down. He is not eating all that much. He says he is having some unusual eating hours because of his wife's illness. She is in a rehabilitation facility several miles away from where he lives.  He has had some constipation. I offered some lactulose try to help with his constipation. He is a little worried about going too much because of this inguinal hernia. I told him that this will not be affected.  He's had no cough. He has had some swallowing issues. Again I cannot understand why he would have this. Medications:  Allergies as of 01/23/2017      Reactions   Sulfa Antibiotics Itching      Medication List       Accurate as of  01/23/17 11:36 AM. Always use your most recent med list.          amLODipine-benazepril 5-10 MG capsule Commonly known as:  LOTREL Take 1 capsule by mouth daily.   amoxicillin 875 MG tablet Commonly known as:  AMOXIL Take 875 mg by mouth.   aspirin EC 81 MG tablet Take 81 mg by mouth daily.   CALCIUM 600 + D PO Take by mouth every morning.   HYDROcodone-acetaminophen 5-325 MG tablet Commonly known as:  NORCO/VICODIN Take 1 tablet by mouth every 6 (six) hours as needed for moderate pain.   LORazepam 0.5 MG tablet Commonly known as:  ATIVAN Take 1 tablet (0.5 mg total) by mouth every 6 (six) hours as needed (Nausea or vomiting).   MULTIVITAL PO Take 1 tablet by mouth every morning.   ondansetron 8 MG tablet Commonly known as:  ZOFRAN Take 1 tablet (8 mg total) by mouth 2 (two) times daily as needed for refractory nausea / vomiting. Take as needed on days 4-7, 11-14, 18-28.   prochlorperazine 10 MG tablet Commonly known as:  COMPAZINE Take 1 tablet (10 mg total) by mouth every 6 (six) hours as needed (Nausea or vomiting).   VITAMIN B 12 PO Take 1,000 mcg by mouth every morning.   vitamin B-6 250 MG tablet Take 500 mg by mouth daily.   Vitamin D (Ergocalciferol)  50000 units Caps capsule Commonly known as:  DRISDOL Take 50,000 Units by mouth every 7 (seven) days. Sundays       Allergies:  Allergies  Allergen Reactions  . Sulfa Antibiotics Itching    Past Medical History, Surgical history, Social history, and Family History were reviewed and updated.  Review of Systems: All other 10 point review of systems is negative.   Physical Exam:  weight is 141 lb (64 kg). His oral temperature is 98.4 F (36.9 C). His blood pressure is 168/76 (abnormal) and his pulse is 72. His respiration is 17 and oxygen saturation is 100%.   Wt Readings from Last 3 Encounters:  01/23/17 141 lb (64 kg)  12/26/16 141 lb 12.8 oz (64.3 kg)  12/12/16 138 lb (62.6 kg)    Thin,  elderly gentleman. He is no distress. Head and neck exam shows no ocular or oral lesions. He has no palpable cervical or supraclavicular lymph nodes. Lungs are clear bilaterally. Cardiac exam is regular rate and rhythm with no murmurs rubs or bruits. Abdomen is soft. He has good bowel sounds. There is no fluid wave. There is no palpable liver or spleen. Back exam no tenderness over the spine ribs or hips. He has some kyphosis. Extremities shows some age related changes. He has good range of motion and strength. Skin exam shows no rashes. Neurological exam is nonfocal.ed  Lab Results  Component Value Date   WBC 5.9 01/23/2017   HGB 9.6 (L) 01/23/2017   HCT 27.3 (L) 01/23/2017   MCV 94 01/23/2017   PLT 164 01/23/2017   Lab Results  Component Value Date   FERRITIN 179 12/26/2016   IRON 52 12/26/2016   TIBC 231 12/26/2016   UIBC 179 12/26/2016   IRONPCTSAT 22 12/26/2016   Lab Results  Component Value Date   RBC 2.92 (L) 01/23/2017   Lab Results  Component Value Date   KPAFRELGTCHN 3.11 (H) 07/28/2015   LAMBDASER 37.90 (H) 07/28/2015   KAPLAMBRATIO 0.53 12/26/2016   Lab Results  Component Value Date   IGGSERUM 367 (L) 12/26/2016   IGA 204 07/28/2015   IGMSERUM 20 12/26/2016   Lab Results  Component Value Date   TOTALPROTELP 7.9 07/28/2015   ALBUMINELP 3.7 (L) 07/28/2015   A1GS 0.3 07/28/2015   A2GS 0.7 07/28/2015   BETS 0.4 07/28/2015   BETA2SER 0.3 07/28/2015   GAMS 2.5 (H) 07/28/2015   MSPIKE 0.1 (H) 11/28/2016   SPEI * 07/28/2015     Chemistry      Component Value Date/Time   NA 139 01/23/2017 1039   NA 141 01/09/2017 1004   K 3.9 01/23/2017 1039   K 4.4 01/09/2017 1004   CL 106 01/23/2017 1039   CO2 28 01/23/2017 1039   CO2 22 01/09/2017 1004   BUN 16 01/23/2017 1039   BUN 23.9 01/09/2017 1004   CREATININE 1.4 (H) 01/23/2017 1039   CREATININE 1.5 (H) 01/09/2017 1004      Component Value Date/Time   CALCIUM 9.0 01/23/2017 1039   CALCIUM 8.8 01/09/2017 1004    ALKPHOS 73 01/23/2017 1039   ALKPHOS 80 01/09/2017 1004   AST 26 01/23/2017 1039   AST 18 01/09/2017 1004   ALT 18 01/23/2017 1039   ALT 12 01/09/2017 1004   BILITOT 1.20 01/23/2017 1039   BILITOT 0.92 01/09/2017 1004      Impression and Plan: [name] is a very pleasant 83 yo caucasian gentleman with IgG lambda myeloma. He has done well on  Kyprolis so far and his myeloma counts have remained stable. We have rechecked these today.   We will go ahead and give him his Kyprolis today.  We will hold his Zometa.  I'll not sure what exactly is going on with his mouth. It does not look like this is any osteonecrosis.   I know that he is under a lot of stress with his wife being in rehabilitation.  We will plan to see him back here in 2 weeks.  [name], MD 6/14/201811:36 AM

## 2017-01-24 LAB — KAPPA/LAMBDA LIGHT CHAINS
IG LAMBDA FREE LIGHT CHAIN: 28 mg/L — AB (ref 5.7–26.3)
Ig Kappa Free Light Chain: 13 mg/L (ref 3.3–19.4)
KAPPA/LAMBDA FLC RATIO: 0.46 (ref 0.26–1.65)

## 2017-01-27 LAB — MULTIPLE MYELOMA PANEL, SERUM
ALBUMIN SERPL ELPH-MCNC: 3.2 g/dL (ref 2.9–4.4)
ALPHA2 GLOB SERPL ELPH-MCNC: 0.8 g/dL (ref 0.4–1.0)
Albumin/Glob SerPl: 1.4 (ref 0.7–1.7)
Alpha 1: 0.3 g/dL (ref 0.0–0.4)
B-GLOBULIN SERPL ELPH-MCNC: 0.8 g/dL (ref 0.7–1.3)
GAMMA GLOB SERPL ELPH-MCNC: 0.4 g/dL (ref 0.4–1.8)
GLOBULIN, TOTAL: 2.3 g/dL (ref 2.2–3.9)
IGG (IMMUNOGLOBIN G), SERUM: 434 mg/dL — AB (ref 700–1600)
IgA, Qn, Serum: 65 mg/dL (ref 61–437)
IgM, Qn, Serum: 26 mg/dL (ref 15–143)
M Protein SerPl Elph-Mcnc: 0.3 g/dL — ABNORMAL HIGH
TOTAL PROTEIN: 5.5 g/dL — AB (ref 6.0–8.5)

## 2017-01-28 ENCOUNTER — Ambulatory Visit (HOSPITAL_COMMUNITY)
Admission: RE | Admit: 2017-01-28 | Discharge: 2017-01-28 | Disposition: A | Discharge status: Home / Self Care | Payer: Medicare HMO | Source: Ambulatory Visit | Attending: Hematology & Oncology | Admitting: Hematology & Oncology

## 2017-01-28 ENCOUNTER — Telehealth: Payer: Self-pay | Admitting: *Deleted

## 2017-01-28 DIAGNOSIS — R6884 Jaw pain: Secondary | ICD-10-CM | POA: Diagnosis not present

## 2017-01-28 DIAGNOSIS — C9000 Multiple myeloma not having achieved remission: Secondary | ICD-10-CM

## 2017-01-28 NOTE — Telephone Encounter (Signed)
Patient is c/o continued pain in jaw and pain with swallowing. He has had these concerns for awhile and has been worked up with his dentist, but the pain is getting worse, and the dentist has not found any reason for his continued complaints. His sleep is now being affected from the constant pain. He states the pain is causing some difficulty with swallowing. He's not sure what to do next and wonders if maybe a referral to an ENT would be appropriate.  Reviewed with Dr Marin Olp. He would like to order some radiological exams to assess the jaw and swallowing before referring patient out. Orders for orthopantogram and swallow study received and placed.   Patient is aware of orders. Will attempt to have orthopantogram scheduled in the next 24 hours.

## 2017-01-29 ENCOUNTER — Other Ambulatory Visit: Payer: Self-pay | Admitting: *Deleted

## 2017-01-29 DIAGNOSIS — C9002 Multiple myeloma in relapse: Secondary | ICD-10-CM

## 2017-01-29 DIAGNOSIS — K1379 Other lesions of oral mucosa: Secondary | ICD-10-CM

## 2017-02-03 ENCOUNTER — Encounter (HOSPITAL_COMMUNITY): Payer: Self-pay | Admitting: Emergency Medicine

## 2017-02-03 ENCOUNTER — Emergency Department (HOSPITAL_COMMUNITY)
Admission: EM | Admit: 2017-02-03 | Discharge: 2017-02-04 | Disposition: A | Discharge status: Home / Self Care | Payer: Medicare HMO | Attending: Emergency Medicine | Admitting: Emergency Medicine

## 2017-02-03 ENCOUNTER — Emergency Department (HOSPITAL_COMMUNITY): Payer: Medicare HMO

## 2017-02-03 DIAGNOSIS — Z7982 Long term (current) use of aspirin: Secondary | ICD-10-CM | POA: Insufficient documentation

## 2017-02-03 DIAGNOSIS — L03211 Cellulitis of face: Secondary | ICD-10-CM | POA: Insufficient documentation

## 2017-02-03 DIAGNOSIS — L0201 Cutaneous abscess of face: Secondary | ICD-10-CM | POA: Diagnosis not present

## 2017-02-03 DIAGNOSIS — L0291 Cutaneous abscess, unspecified: Secondary | ICD-10-CM | POA: Diagnosis present

## 2017-02-03 LAB — CBC WITH DIFFERENTIAL/PLATELET
BASOS ABS: 0 10*3/uL (ref 0.0–0.1)
Basophils Relative: 0 %
EOS ABS: 0.1 10*3/uL (ref 0.0–0.7)
EOS PCT: 2 %
HCT: 27.2 % — ABNORMAL LOW (ref 39.0–52.0)
HEMOGLOBIN: 9.4 g/dL — AB (ref 13.0–17.0)
LYMPHS ABS: 1 10*3/uL (ref 0.7–4.0)
LYMPHS PCT: 13 %
MCH: 32.2 pg (ref 26.0–34.0)
MCHC: 34.6 g/dL (ref 30.0–36.0)
MCV: 93.2 fL (ref 78.0–100.0)
Monocytes Absolute: 0.5 10*3/uL (ref 0.1–1.0)
Monocytes Relative: 6 %
NEUTROS PCT: 79 %
Neutro Abs: 5.9 10*3/uL (ref 1.7–7.7)
PLATELETS: 245 10*3/uL (ref 150–400)
RBC: 2.92 MIL/uL — AB (ref 4.22–5.81)
RDW: 13.3 % (ref 11.5–15.5)
WBC: 7.5 10*3/uL (ref 4.0–10.5)

## 2017-02-03 LAB — BASIC METABOLIC PANEL
ANION GAP: 4 — AB (ref 5–15)
BUN: 33 mg/dL — AB (ref 6–20)
CO2: 27 mmol/L (ref 22–32)
Calcium: 8.5 mg/dL — ABNORMAL LOW (ref 8.9–10.3)
Chloride: 106 mmol/L (ref 101–111)
Creatinine, Ser: 1.43 mg/dL — ABNORMAL HIGH (ref 0.61–1.24)
GFR calc Af Amer: 50 mL/min — ABNORMAL LOW (ref >60)
GFR, EST NON AFRICAN AMERICAN: 43 mL/min — AB (ref >60)
Glucose, Bld: 111 mg/dL — ABNORMAL HIGH (ref 65–99)
POTASSIUM: 4.4 mmol/L (ref 3.5–5.1)
SODIUM: 137 mmol/L (ref 135–145)

## 2017-02-03 LAB — LIPASE, BLOOD: LIPASE: 15 U/L (ref 11–51)

## 2017-02-03 MED ORDER — IOPAMIDOL (ISOVUE-300) INJECTION 61%
INTRAVENOUS | Status: AC
  Filled 2017-02-03: qty 75

## 2017-02-03 MED ORDER — IOPAMIDOL (ISOVUE-300) INJECTION 61%
75.0000 mL | Freq: Once | INTRAVENOUS | Status: AC | PRN
  Administered 2017-02-03: 60 mL via INTRAVENOUS

## 2017-02-03 MED ORDER — SODIUM CHLORIDE 0.9 % IV SOLN
3.0000 g | Freq: Four times a day (QID) | INTRAVENOUS | Status: DC
  Administered 2017-02-04: 3 g via INTRAVENOUS
  Filled 2017-02-03: qty 3

## 2017-02-03 NOTE — ED Triage Notes (Signed)
Patient reports he had a tooth pulled and placed back in to his front, lower jaw with in the last 2 weeks. Pt noticed about a week ago, an abscess to this chin. Area is swollen, inflamed.

## 2017-02-03 NOTE — ED Provider Notes (Signed)
[facility] DEPT Provider Note   CSN: [id number] Arrival date & time: 02/03/17  1716     History   Chief Complaint Chief Complaint  Patient presents with  . Abscess    HPI Christian Burns is a 83 y.o. male who presents with 3 days of redness/swelling to the lower chin area. Patient reports that symptoms have progressively worsened over the last few days. Patient recently had a dental implant 2 weeks ago secondary to loss of bone density from myeloma medication. He reports that since the procedure he had been doing well until the symptoms began 3 days ago. He called his dentist referred him to an ENT who is scheduled to see tomorrow. Patient comes today because the area has gotten progressively bigger over the last 2 days and his daughter was concerned about the location. He states that his pain is worse with swallowing and has had some minor difficulty swelling but he able to tolerate PO without any difficulty. He has been able to tolerate his secretions. He has no difficulty breathing. He denies any fever.  The history is provided by the patient.    Past Medical History:  Diagnosis Date  . History of radiation therapy 06/17/13-07/06/13   35 gray to upper lumbar spine  . 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 Multiple myeloma [facility]) Sept 2010  . Multiple myeloma in relapse ([place]) 04/05/2016  . Radiation 09/26/15-10/20/15   lower thoracic spine, upper lumbar spine 30 gray    Patient Active Problem List   Diagnosis Date Noted  . Iron deficiency anemia secondary to inadequate dietary iron intake 07/18/2016  . Multiple myeloma in relapse ([place]) 04/05/2016  . 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 Multiple myeloma in remission ([place]) 09/08/2015  . Myeloma ([place]) 08/23/2011    Past Surgical History:  Procedure Laterality Date  . TONSILLECTOMY         Home Medications    Prior to Admission medications   Medication Sig Start Date End Date Taking? Authorizing  Provider  amLODipine-benazepril (LOTREL) 5-10 MG capsule Take 1 capsule by mouth daily. 10/10/16   [name], MD  amoxicillin (AMOXIL) 875 MG tablet Take 875 mg by mouth. 12/24/16   [provider]  amoxicillin-clavulanate (AUGMENTIN) 500-125 MG tablet Take 1 tablet (500 mg total) by mouth every 8 (eight) hours. 02/04/17   [name], PA-C  aspirin EC 81 MG tablet Take 81 mg by mouth daily.  06/17/13   [name], MD  Calcium Carb-Cholecalciferol (CALCIUM 600 + D PO) Take by mouth every morning.    [provider]  Cyanocobalamin (VITAMIN B 12 PO) Take 1,000 mcg by mouth every morning.     [provider]  HYDROcodone-acetaminophen (NORCO/VICODIN) 5-325 MG tablet Take 1 tablet by mouth every 6 (six) hours as needed for moderate pain. 01/23/17   [name], MD  LORazepam (ATIVAN) 0.5 MG tablet Take 1 tablet (0.5 mg total) by mouth every 6 (six) hours as needed (Nausea or vomiting). 04/18/16   [name], MD  Multiple Vitamins-Minerals (MULTIVITAL PO) Take 1 tablet by mouth every morning.     [provider]  ondansetron (ZOFRAN) 8 MG tablet Take 1 tablet (8 mg total) by mouth 2 (two) times daily as needed for refractory nausea / vomiting. Take as needed on days 4-7, 11-14, 18-28. 04/18/16   [name], MD  prochlorperazine (COMPAZINE) 10 MG tablet Take 1 tablet (10 mg total) by mouth every 6 (six)  hours as needed (Nausea or vomiting). 04/18/16   [name], MD  Pyridoxine HCl (VITAMIN B-6) 250 MG tablet Take 500 mg by mouth daily. 09/17/13   [name], MD  Vitamin D, Ergocalciferol, (DRISDOL) 50000 UNITS CAPS capsule Take 50,000 Units by mouth every 7 (seven) days. Sundays    [provider]    Family History History reviewed. No pertinent family history.  Social History Social History  Substance Use Topics  . Smoking status: Never Smoker  . Smokeless tobacco: Never Used     Comment: never used tobacco  .  Alcohol use 0.0 oz/week     Comment: Wine Occassionally     Allergies   Sulfa antibiotics   Review of Systems Review of Systems  Constitutional: Negative for fever.  HENT: Positive for trouble swallowing.   Skin: Positive for color change.     Physical Exam Updated Vital Signs BP (!) 146/85   Pulse 84   Temp 98.6 F (37 C) (Oral)   Resp 20   Ht 5' 10"  (1.778 m)   Wt 59 kg (130 lb)   SpO2 99%   BMI 18.65 kg/m   Physical Exam  Constitutional: He appears well-developed and well-nourished.  Sitting comfortably on examination table  HENT:  Head: Normocephalic and atraumatic.  Mouth/Throat: Uvula is midline, oropharynx is clear and moist and mucous membranes are normal.  Full elevation/depression and lateral movement of mandible intact without difficulty. No trismus. No evidence of peritonsillar abscess. No gingival erythema or swelling. No evidence of dental abscess.  Eyes: Conjunctivae and EOM are normal. Right eye exhibits no discharge. Left eye exhibits no discharge. No scleral icterus.  Neck: Full passive range of motion without pain.  No evidence of neck swelling  Pulmonary/Chest: Effort normal.  No evidence of respiratory distress. Able to speak in full sentences without difficulty.  Lymphadenopathy:    He has no cervical adenopathy.  Neurological: He is alert.  Skin: Skin is warm and dry.  3 cm circular area of erythema and induration to the lower chin. No fluctuance noted.   Psychiatric: He has a normal mood and affect. His speech is normal and behavior is normal.  Nursing note and vitals reviewed.    ED Treatments / Results  Labs (all labs ordered are listed, but only abnormal results are displayed) Labs Reviewed  CBC WITH DIFFERENTIAL/PLATELET - Abnormal; Notable for the following:       Result Value   RBC 2.92 (*)    Hemoglobin 9.4 (*)    HCT 27.2 (*)    All other components within normal limits  BASIC METABOLIC PANEL - Abnormal; Notable for the  following:    Glucose, Bld 111 (*)    BUN 33 (*)    Creatinine, Ser 1.43 (*)    Calcium 8.5 (*)    GFR calc non Af Amer 43 (*)    GFR calc Af Amer 50 (*)    Anion gap 4 (*)    All other components within normal limits  LIPASE, BLOOD    EKG  EKG Interpretation None       Radiology Ct Soft Tissue Neck W Contrast  Result Date: 02/03/2017 CLINICAL DATA:  83 y/o  M; front lower jaw EXAM: CT NECK WITH CONTRAST TECHNIQUE: Multidetector CT imaging of the neck was performed using the standard protocol following the bolus administration of intravenous contrast. CONTRAST:  54m ISOVUE-300 IOPAMIDOL (ISOVUE-300) INJECTION 61% COMPARISON:  None. FINDINGS: Pharynx and larynx: Normal. No mass  or swelling. Salivary glands: Punctate left submandibular gland sialolith. No evidence for ductal obstruction of the salivary glands or inflammatory change. Thyroid: Thyroid nodules measuring up to 9 mm in the right lower pole (series 8, image 97). Lymph nodes: None enlarged or abnormal density. Vascular: Mild calcific atherosclerosis of the aortic arch and right carotid bifurcation. Limited intracranial: Negative. Visualized orbits: Negative. Mastoids and visualized paranasal sinuses: Status post left maxillary antrostomy and ethmoidectomy. There is a 9 mm mucous retention cyst or polyp at the posterior margin of the maxillary antrostomy. Normally aerated visualized mastoid air cells. Skeleton: The left lateral mandibular incisor is absent the root and there is a bony cavity with defect in the outer table of the left parasymphyseal mandible in association with the incisor compatible with odontogenic disease. There is a rim enhancing fluid collection compatible with abscess within the soft tissues overlying the mandibular symphysis measuring 5 x 9 x 18 mm (AP x ML x CC series 8, image 53 and series 7, image 47). There is extensive soft tissue swelling overlying the mandible without extension into the deep cervical  spaces. Upper chest: Biapical pleuroparenchymal scarring with calcifications. Other: None. IMPRESSION: 1. Small abscess within soft tissues overlying the mandibular symphysis and surrounding inflammatory changes. No significant extension of inflammatory changes in the deep cervical spaces. The abscess is likely odontogenic associated with the dental cavity of the left lateral mandibular incisor. 2. Left submandibular gland nonobstructing punctate sialolith. 3. Additional chronic changes as above. Electronically Signed   By: Kristine Garbe M.D.   On: 02/03/2017 23:28    Procedures Procedures (including critical care time)  Medications Ordered in ED Medications  iopamidol (ISOVUE-300) 61 % injection (not administered)  Ampicillin-Sulbactam (UNASYN) 3 g in sodium chloride 0.9 % 100 mL IVPB (0 g Intravenous Stopped 02/04/17 0045)  iopamidol (ISOVUE-300) 61 % injection 75 mL (60 mLs Intravenous Contrast Given 02/03/17 2256)     Initial Impression / Assessment and Plan / ED Course  I have reviewed the triage vital signs and the nursing notes.  Pertinent labs & imaging results that were available during my care of the patient were reviewed by me and considered in my medical decision making (see chart for details).     83 year old male with recent history of dental implants 2 weeks ago presents with redness/swelling to the chin. Patient is afebrile, non-toxic appearing, sitting comfortably on examination table. Vital signs reviewed and stable. Concern for cellulitis versus abscess. History/physical exam are not concerning for peritonsillar abscess or Ludwig angina. Given recent history of dental implant and presentation,will obtain CT soft tissue neck for evaluation.   CT soft tissue neck reviewed. He has a small abscess with surrounding cellulitis that does not appear to extend to the deep cervical spaces. Discussed results with patient. Patient is scheduled to see an ENT doctor for the same  symptoms tomorrow at 10 AM. He has a scheduled appointment with [facility] ENT. Given that he has good follow-up will plan to give a dose of IV antibiotics in the department and will have him follow-up with ENT as previously scheduled. Patient and family are agreeable to plan.  IV antibiotics finished. Patient is in no respiratory distress and has no difficulty talking. Vital signs remained stable throughout ED course. Patient is stable for discharge at this time.Will plan to give antibiotics for home. Discussed with patient regarding following up with ENT tomorrow morning. Strict return precautions discussed patient and family expresses understanding and agreement to plan.  Final Clinical  Impressions(s) / ED Diagnoses   Final diagnoses:  Abscess  Cellulitis of face    New Prescriptions Discharge Medication List as of 02/04/2017 12:51 AM    START taking these medications   Details  amoxicillin-clavulanate (AUGMENTIN) 500-125 MG tablet Take 1 tablet (500 mg total) by mouth every 8 (eight) hours., Starting Tue 02/04/2017, Print         [name], PA-C 02/04/17 0101    Tanna Furry, MD 02/14/17 [phone]

## 2017-02-04 ENCOUNTER — Telehealth: Payer: Self-pay | Admitting: *Deleted

## 2017-02-04 DIAGNOSIS — Z7982 Long term (current) use of aspirin: Secondary | ICD-10-CM | POA: Diagnosis not present

## 2017-02-04 DIAGNOSIS — Z8709 Personal history of other diseases of the respiratory system: Secondary | ICD-10-CM | POA: Diagnosis not present

## 2017-02-04 DIAGNOSIS — K047 Periapical abscess without sinus: Secondary | ICD-10-CM | POA: Diagnosis not present

## 2017-02-04 DIAGNOSIS — H6122 Impacted cerumen, left ear: Secondary | ICD-10-CM | POA: Diagnosis not present

## 2017-02-04 DIAGNOSIS — L0291 Cutaneous abscess, unspecified: Secondary | ICD-10-CM | POA: Diagnosis not present

## 2017-02-04 DIAGNOSIS — C9000 Multiple myeloma not having achieved remission: Secondary | ICD-10-CM | POA: Diagnosis not present

## 2017-02-04 DIAGNOSIS — L03211 Cellulitis of face: Secondary | ICD-10-CM | POA: Diagnosis not present

## 2017-02-04 DIAGNOSIS — J343 Hypertrophy of nasal turbinates: Secondary | ICD-10-CM | POA: Diagnosis not present

## 2017-02-04 MED ORDER — AMOXICILLIN-POT CLAVULANATE 500-125 MG PO TABS: 1.0000 | ORAL_TABLET | Freq: Three times a day (TID) | ORAL | 0 refills | Status: DC

## 2017-02-04 NOTE — Telephone Encounter (Signed)
Received notification from outside physician that patient was diagnosed and treated for an oral abscess. Dr Marin Olp reviewed the notes, and would like patient to skip this weeks chemotherapy treatment to allow for healing.   Called patient and notified him of cancelled dose and appointment cancelled.

## 2017-02-04 NOTE — Discharge Instructions (Signed)
Follow-up with the ENT doctor as scheduled tomorrow. If for some reason you cannot  Take antibiotics as prescribed.  Return the emergency Department for any worsening pain, difficulty breathing, difficulty swelling, fever or any other worsening or concerning symptoms.

## 2017-02-06 ENCOUNTER — Ambulatory Visit: Payer: Medicare HMO | Admitting: Hematology & Oncology

## 2017-02-06 ENCOUNTER — Other Ambulatory Visit: Payer: Medicare HMO

## 2017-02-06 ENCOUNTER — Ambulatory Visit: Payer: Medicare HMO

## 2017-02-13 ENCOUNTER — Other Ambulatory Visit: Payer: Self-pay | Admitting: *Deleted

## 2017-02-20 ENCOUNTER — Ambulatory Visit (HOSPITAL_BASED_OUTPATIENT_CLINIC_OR_DEPARTMENT_OTHER): Payer: Medicare HMO

## 2017-02-20 ENCOUNTER — Other Ambulatory Visit (HOSPITAL_BASED_OUTPATIENT_CLINIC_OR_DEPARTMENT_OTHER): Payer: Medicare HMO

## 2017-02-20 ENCOUNTER — Ambulatory Visit (HOSPITAL_BASED_OUTPATIENT_CLINIC_OR_DEPARTMENT_OTHER): Payer: Medicare HMO | Admitting: Family

## 2017-02-20 VITALS — BP 139/73 | HR 75 | Temp 98.2°F | Resp 16 | Wt 138.0 lb

## 2017-02-20 DIAGNOSIS — C9002 Multiple myeloma in relapse: Secondary | ICD-10-CM | POA: Diagnosis not present

## 2017-02-20 DIAGNOSIS — Z5112 Encounter for antineoplastic immunotherapy: Secondary | ICD-10-CM

## 2017-02-20 DIAGNOSIS — C9000 Multiple myeloma not having achieved remission: Secondary | ICD-10-CM

## 2017-02-20 LAB — CBC WITH DIFFERENTIAL (CANCER CENTER ONLY)
BASO#: 0 10*3/uL (ref 0.0–0.2)
BASO%: 0.7 % (ref 0.0–2.0)
EOS%: 3.1 % (ref 0.0–7.0)
Eosinophils Absolute: 0.1 10*3/uL (ref 0.0–0.5)
HEMATOCRIT: 26.6 % — AB (ref 38.7–49.9)
HGB: 9 g/dL — ABNORMAL LOW (ref 13.0–17.1)
LYMPH#: 0.3 10*3/uL — ABNORMAL LOW (ref 0.9–3.3)
LYMPH%: 7.5 % — AB (ref 14.0–48.0)
MCH: 32.4 pg (ref 28.0–33.4)
MCHC: 33.8 g/dL (ref 32.0–35.9)
MCV: 96 fL (ref 82–98)
MONO#: 0.4 10*3/uL (ref 0.1–0.9)
MONO%: 10.4 % (ref 0.0–13.0)
NEUT#: 3.2 10*3/uL (ref 1.5–6.5)
NEUT%: 78.3 % (ref 40.0–80.0)
PLATELETS: 167 10*3/uL (ref 145–400)
RBC: 2.78 10*6/uL — ABNORMAL LOW (ref 4.20–5.70)
RDW: 13.4 % (ref 11.1–15.7)
WBC: 4.1 10*3/uL (ref 4.0–10.0)

## 2017-02-20 LAB — CMP (CANCER CENTER ONLY)
ALK PHOS: 79 U/L (ref 26–84)
ALT: 27 U/L (ref 10–47)
AST: 27 U/L (ref 11–38)
Albumin: 3 g/dL — ABNORMAL LOW (ref 3.3–5.5)
BUN: 30 mg/dL — AB (ref 7–22)
CALCIUM: 8.1 mg/dL (ref 8.0–10.3)
CO2: 27 meq/L (ref 18–33)
Chloride: 112 mEq/L — ABNORMAL HIGH (ref 98–108)
Creat: 1.6 mg/dl — ABNORMAL HIGH (ref 0.6–1.2)
GLUCOSE: 113 mg/dL (ref 73–118)
POTASSIUM: 4.2 meq/L (ref 3.3–4.7)
Sodium: 142 mEq/L (ref 128–145)
Total Bilirubin: 1 mg/dl (ref 0.20–1.60)
Total Protein: 5.9 g/dL — ABNORMAL LOW (ref 6.4–8.1)

## 2017-02-20 MED ORDER — DEXTROSE 5 % IV SOLN
60.0000 mg | Freq: Once | INTRAVENOUS | Status: AC
  Administered 2017-02-20: 60 mg via INTRAVENOUS
  Filled 2017-02-20: qty 30

## 2017-02-20 MED ORDER — SODIUM CHLORIDE 0.9 % IV SOLN
Freq: Once | INTRAVENOUS | Status: AC
  Administered 2017-02-20: 11:00:00 via INTRAVENOUS

## 2017-02-20 MED ORDER — DEXAMETHASONE SODIUM PHOSPHATE 10 MG/ML IJ SOLN
10.0000 mg | Freq: Once | INTRAMUSCULAR | Status: AC
  Administered 2017-02-20: 10 mg via INTRAVENOUS

## 2017-02-20 MED ORDER — DEXAMETHASONE SODIUM PHOSPHATE 10 MG/ML IJ SOLN
INTRAMUSCULAR | Status: AC
  Filled 2017-02-20: qty 1

## 2017-02-20 NOTE — Patient Instructions (Signed)
Carfilzomib injection What is this medicine? CARFILZOMIB (kar FILZ oh mib) targets a specific protein within cancer cells and stops the cancer cells from growing. It is used to treat multiple myeloma. This medicine may be used for other purposes; ask your health care provider or pharmacist if you have questions. COMMON BRAND NAME(S): KYPROLIS What should I tell my health care provider before I take this medicine? They need to know if you have any of these conditions: -heart disease -history of blood clots -irregular heartbeat -kidney disease -liver disease -lung or breathing disease -an unusual or allergic reaction to carfilzomib, or other medicines, foods, dyes, or preservatives -pregnant or trying to get pregnant -breast-feeding How should I use this medicine? This medicine is for injection or infusion into a vein. It is given by a health care professional in a hospital or clinic setting. Talk to your pediatrician regarding the use of this medicine in children. Special care may be needed. Overdosage: If you think you have taken too much of this medicine contact a poison control center or emergency room at once. NOTE: This medicine is only for you. Do not share this medicine with others. What if I miss a dose? It is important not to miss your dose. Call your doctor or health care professional if you are unable to keep an appointment. What may interact with this medicine? Interactions are not expected. Give your health care provider a list of all the medicines, herbs, non-prescription drugs, or dietary supplements you use. Also tell them if you smoke, drink alcohol, or use illegal drugs. Some items may interact with your medicine. This list may not describe all possible interactions. Give your health care provider a list of all the medicines, herbs, non-prescription drugs, or dietary supplements you use. Also tell them if you smoke, drink alcohol, or use illegal drugs. Some items may  interact with your medicine. What should I watch for while using this medicine? Your condition will be monitored carefully while you are receiving this medicine. Report any side effects. Continue your course of treatment even though you feel ill unless your doctor tells you to stop. You may need blood work done while you are taking this medicine. Do not become pregnant while taking this medicine or for at least 30 days after stopping it. Women should inform their doctor if they wish to become pregnant or think they might be pregnant. There is a potential for serious side effects to an unborn child. Men should not father a child while taking this medicine and for 90 days after stopping it. Talk to your health care professional or pharmacist for more information. Do not breast-feed an infant while taking this medicine. Check with your doctor or health care professional if you get an attack of severe diarrhea, nausea and vomiting, or if you sweat a lot. The loss of too much body fluid can make it dangerous for you to take this medicine. You may get dizzy. Do not drive, use machinery, or do anything that needs mental alertness until you know how this medicine affects you. Do not stand or sit up quickly, especially if you are an older patient. This reduces the risk of dizzy or fainting spells. What side effects may I notice from receiving this medicine? Side effects that you should report to your doctor or health care professional as soon as possible: -allergic reactions like skin rash, itching or hives, swelling of the face, lips, or tongue -confusion -dizziness -feeling faint or lightheaded -fever or chills -  palpitations -seizures -signs and symptoms of bleeding such as bloody or black, tarry stools; red or dark-brown urine; spitting up blood or brown material that looks like coffee grounds; red spots on the skin; unusual bruising or bleeding including from the eye, gums, or nose -signs and symptoms of  a blood clot such as breathing problems; changes in vision; chest pain; severe, sudden headache; pain, swelling, warmth in the leg; trouble speaking; sudden numbness or weakness of the face, arm or leg -signs and symptoms of kidney injury like trouble passing urine or change in the amount of urine -signs and symptoms of liver injury like dark yellow or brown urine; general ill feeling or flu-like symptoms; light-colored stools; loss of appetite; nausea; right upper belly pain; unusually weak or tired; yellowing of the eyes or skin Side effects that usually do not require medical attention (report to your doctor or health care professional if they continue or are bothersome): -back pain -cough -diarrhea -headache -muscle cramps -vomiting This list may not describe all possible side effects. Call your doctor for medical advice about side effects. You may report side effects to FDA at 1-800-FDA-1088. Where should I keep my medicine? This drug is given in a hospital or clinic and will not be stored at home. NOTE: This sheet is a summary. It may not cover all possible information. If you have questions about this medicine, talk to your doctor, pharmacist, or health care provider.  2018 Elsevier/Gold Standard (2015-08-31 13:39:23)  

## 2017-02-20 NOTE — Progress Notes (Signed)
Hematology and Oncology Follow Up Visit  EDWARDS MCKELVIE [id number] 12/10/1929 83 y.o. 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 Stitely is here today for follow-up and treatment. He is still having some fatigue. His wife, Romie Minus, has moved back home and has a health care worker during the day but Olin is having to help her at night and this is keeping him quite busy. She is on lasix and home O2.  He had several teeth extracted recently and will be following up with Dr. Redmond Baseman in a few weeks. He states that he is not in any hurry to get a partial plate. He feels that they will want to remove more teeth.  In June, His M-spike was 0.3 g/dL, IgG level was 434 mg/dL and lambda light chain was 28.0 mg/L.  No fever, chills, n/v, cough, rash, dizziness, SOB, chest pain, palpitations, abdominal pain or change sin bowel or bladder habits.  He still has neuropathy in his feet which he feels a bit more prominent but states that this is tolerable and has not effected his balance or gait. He has had no falls or syncopal episodes. The neuropathy in his hands is unchanged. No swelling or tenderness in his extremities.  His weight is up 8 labs since his last visit to 138 lbs. He is trying to eat and stay well hydrated. He is going to start drinking 2-3 Boost a day instead of just once.   ECOG Performance Status: 1 - Symptomatic but completely ambulatory  Medications:  Allergies as of 02/20/2017      Reactions   Sulfa Antibiotics Itching      Medication List       Accurate as of  02/20/17 10:54 PM. Always use your most recent med list.          amLODipine-benazepril 5-10 MG capsule Commonly known as:  LOTREL Take 1 capsule by mouth daily.   amoxicillin 875 MG tablet Commonly known as:  AMOXIL Take 875 mg by mouth.   amoxicillin-clavulanate 500-125 MG tablet Commonly known as:  AUGMENTIN Take 1 tablet (500 mg total) by mouth every 8 (eight) hours.   aspirin EC 81 MG tablet Take 81 mg by mouth daily.   CALCIUM 600 + D PO Take by mouth every morning.   HYDROcodone-acetaminophen 5-325 MG tablet Commonly known as:  NORCO/VICODIN Take 1 tablet by mouth every 6 (six) hours as needed for moderate pain.   LORazepam 0.5 MG tablet Commonly known as:  ATIVAN Take 1 tablet (0.5 mg total) by mouth every 6 (six) hours as needed (Nausea or vomiting).   MULTIVITAL PO Take 1 tablet by mouth every morning.   ondansetron 8 MG tablet Commonly known as:  ZOFRAN Take 1 tablet (8 mg total) by mouth 2 (two) times daily as needed for refractory nausea / vomiting. Take as needed on days 4-7, 11-14, 18-28.   prochlorperazine 10 MG tablet Commonly known as:  COMPAZINE Take 1 tablet (10 mg total) by mouth every 6 (six) hours as needed (Nausea or vomiting).   VITAMIN B 12 PO Take  1,000 mcg by mouth every morning.   vitamin B-6 250 MG tablet Take 500 mg by mouth daily.   Vitamin D (Ergocalciferol) 50000 units Caps capsule Commonly known as:  DRISDOL Take 50,000 Units by mouth every 7 (seven) days. Sundays       Allergies:  Allergies  Allergen Reactions  . Sulfa Antibiotics Itching    Past Medical History, Surgical history, Social history, and Family History were reviewed and updated.  Review of Systems: All other 10 point review of systems is negative.   Physical Exam:  weight is 138 lb (62.6 kg). His oral temperature is 98.2 F (36.8 C). His blood pressure is 139/73 and his pulse is 75. His respiration is 16 and oxygen saturation is 99%.   Wt Readings  from Last 3 Encounters:  02/20/17 138 lb (62.6 kg)  02/03/17 130 lb (59 kg)  01/23/17 141 lb (64 kg)    Ocular: Sclerae unicteric, pupils equal, round and reactive to light Ear-nose-throat: Oropharynx clear, dentition fair Lymphatic: No cervical, supraclavicular or axillary adenopathy Lungs no rales or rhonchi, good excursion bilaterally Heart regular rate and rhythm, no murmur appreciated Abd soft, nontender, positive bowel sounds, no liver or spleen tip palpated on exam, no fluid wave MSK no focal spinal tenderness, no joint edema Neuro: non-focal, well-oriented, appropriate affect Breasts: Deferred   Lab Results  Component Value Date   WBC 4.1 02/20/2017   HGB 9.0 (L) 02/20/2017   HCT 26.6 (L) 02/20/2017   MCV 96 02/20/2017   PLT 167 02/20/2017   Lab Results  Component Value Date   FERRITIN 175 01/23/2017   IRON 54 01/23/2017   TIBC 243 01/23/2017   UIBC 188 01/23/2017   IRONPCTSAT 22 01/23/2017   Lab Results  Component Value Date   RBC 2.78 (L) 02/20/2017   Lab Results  Component Value Date   KPAFRELGTCHN 3.11 (H) 07/28/2015   LAMBDASER 37.90 (H) 07/28/2015   KAPLAMBRATIO 0.46 01/23/2017   Lab Results  Component Value Date   IGGSERUM 434 (L) 01/23/2017   IGA 204 07/28/2015   IGMSERUM 26 01/23/2017   Lab Results  Component Value Date   TOTALPROTELP 7.9 07/28/2015   ALBUMINELP 3.7 (L) 07/28/2015   A1GS 0.3 07/28/2015   A2GS 0.7 07/28/2015   BETS 0.4 07/28/2015   BETA2SER 0.3 07/28/2015   GAMS 2.5 (H) 07/28/2015   MSPIKE 0.1 (H) 11/28/2016   SPEI * 07/28/2015     Chemistry      Component Value Date/Time   NA 142 02/20/2017 0948   NA 141 01/09/2017 1004   K 4.2 02/20/2017 0948   K 4.4 01/09/2017 1004   CL 112 (H) 02/20/2017 0948   CO2 27 02/20/2017 0948   CO2 22 01/09/2017 1004   BUN 30 (H) 02/20/2017 0948   BUN 23.9 01/09/2017 1004   CREATININE 1.6 (H) 02/20/2017 0948   CREATININE 1.5 (H) 01/09/2017 1004      Component Value Date/Time    CALCIUM 8.1 02/20/2017 0948   CALCIUM 8.8 01/09/2017 1004   ALKPHOS 79 02/20/2017 0948   ALKPHOS 80 01/09/2017 1004   AST 27 02/20/2017 0948   AST 18 01/09/2017 1004   ALT 27 02/20/2017 0948   ALT 12 01/09/2017 1004   BILITOT 1.00 02/20/2017 0948   BILITOT 0.92 01/09/2017 1004       Impression and Plan: Mr. Degroff is a very pleasant 83 yo caucasian gentleman with IgG myeloma. She continue to do well on Kyprolis and his Zometa will  remain on hold while he has his dental work.   He is feeling fatigued with all the work he is doing at home caring for his wife.  Myeloma studies for today are pending. We will proceed with treatment today as planned.  We will plan to see him back in another 2 weeks for follow-up, lab and treatment.  Greater than 50% of his 15 minute face to face appointment was spent counseling and coordinating care.  He will contact our office with any questions or concerns We can certainly see him sooner if need be.   Eliezer Bottom, NP 7/12/201810:54 PM

## 2017-02-21 LAB — IGG, IGA, IGM
IGA/IMMUNOGLOBULIN A, SERUM: 91 mg/dL (ref 61–437)
IGG (IMMUNOGLOBIN G), SERUM: 557 mg/dL — AB (ref 700–1600)
IGM (IMMUNOGLOBIN M), SRM: 33 mg/dL (ref 15–143)

## 2017-02-21 LAB — KAPPA/LAMBDA LIGHT CHAINS
Ig Kappa Free Light Chain: 16 mg/L (ref 3.3–19.4)
Ig Lambda Free Light Chain: 44.8 mg/L — ABNORMAL HIGH (ref 5.7–26.3)
Kappa/Lambda FluidC Ratio: 0.36 (ref 0.26–1.65)

## 2017-02-25 ENCOUNTER — Telehealth: Payer: Self-pay | Admitting: *Deleted

## 2017-02-25 NOTE — Telephone Encounter (Signed)
Patient c/o pain in his jaw at the site of his recent abscess. He states the pain is similar to before and his chin/neck are tender.  Instructed patient to call Dr Redmond Baseman, the physician who is treating his abscess to determine how they want to proceed. Patient understood and he states he will call Dr Redmond Baseman.

## 2017-02-27 LAB — PROTEIN ELECTROPHORESIS, SERUM, WITH REFLEX
A/G RATIO SPE: 1.5 (ref 0.7–1.7)
ALBUMIN: 3.2 g/dL (ref 2.9–4.4)
ALPHA 2: 0.7 g/dL (ref 0.4–1.0)
Alpha 1: 0.2 g/dL (ref 0.0–0.4)
BETA: 0.8 g/dL (ref 0.7–1.3)
Gamma Globulin: 0.5 g/dL (ref 0.4–1.8)
Globulin, Total: 2.2 g/dL (ref 2.2–3.9)
Interpretation(See Below): 0
M-Spike, %: 0.3 g/dL — ABNORMAL HIGH
Total Protein: 5.4 g/dL — ABNORMAL LOW (ref 6.0–8.5)

## 2017-03-06 ENCOUNTER — Other Ambulatory Visit (HOSPITAL_BASED_OUTPATIENT_CLINIC_OR_DEPARTMENT_OTHER): Payer: Medicare HMO

## 2017-03-06 ENCOUNTER — Ambulatory Visit (HOSPITAL_BASED_OUTPATIENT_CLINIC_OR_DEPARTMENT_OTHER): Payer: Medicare HMO

## 2017-03-06 ENCOUNTER — Ambulatory Visit (HOSPITAL_BASED_OUTPATIENT_CLINIC_OR_DEPARTMENT_OTHER): Payer: Medicare HMO | Admitting: Hematology & Oncology

## 2017-03-06 VITALS — BP 155/73 | HR 67 | Temp 97.9°F | Resp 16 | Wt 137.0 lb

## 2017-03-06 DIAGNOSIS — C9002 Multiple myeloma in relapse: Secondary | ICD-10-CM

## 2017-03-06 DIAGNOSIS — D508 Other iron deficiency anemias: Secondary | ICD-10-CM | POA: Diagnosis not present

## 2017-03-06 DIAGNOSIS — G629 Polyneuropathy, unspecified: Secondary | ICD-10-CM | POA: Diagnosis not present

## 2017-03-06 DIAGNOSIS — Z5112 Encounter for antineoplastic immunotherapy: Secondary | ICD-10-CM

## 2017-03-06 DIAGNOSIS — C9000 Multiple myeloma not having achieved remission: Secondary | ICD-10-CM

## 2017-03-06 LAB — CMP (CANCER CENTER ONLY)
ALT(SGPT): 24 U/L (ref 10–47)
AST: 28 U/L (ref 11–38)
Albumin: 3 g/dL — ABNORMAL LOW (ref 3.3–5.5)
Alkaline Phosphatase: 71 U/L (ref 26–84)
BUN: 32 mg/dL — AB (ref 7–22)
CHLORIDE: 109 meq/L — AB (ref 98–108)
CO2: 25 meq/L (ref 18–33)
Calcium: 8.9 mg/dL (ref 8.0–10.3)
Creat: 1.6 mg/dl — ABNORMAL HIGH (ref 0.6–1.2)
GLUCOSE: 104 mg/dL (ref 73–118)
Potassium: 4.6 mEq/L (ref 3.3–4.7)
Sodium: 138 mEq/L (ref 128–145)
Total Bilirubin: 1.1 mg/dl (ref 0.20–1.60)
Total Protein: 6 g/dL — ABNORMAL LOW (ref 6.4–8.1)

## 2017-03-06 LAB — CBC WITH DIFFERENTIAL (CANCER CENTER ONLY)
BASO#: 0 10*3/uL (ref 0.0–0.2)
BASO%: 0.7 % (ref 0.0–2.0)
EOS ABS: 0.2 10*3/uL (ref 0.0–0.5)
EOS%: 3.7 % (ref 0.0–7.0)
HCT: 27.3 % — ABNORMAL LOW (ref 38.7–49.9)
HGB: 9.4 g/dL — ABNORMAL LOW (ref 13.0–17.1)
LYMPH#: 0.4 10*3/uL — ABNORMAL LOW (ref 0.9–3.3)
LYMPH%: 8.8 % — AB (ref 14.0–48.0)
MCH: 32.6 pg (ref 28.0–33.4)
MCHC: 34.4 g/dL (ref 32.0–35.9)
MCV: 95 fL (ref 82–98)
MONO#: 0.5 10*3/uL (ref 0.1–0.9)
MONO%: 10.8 % (ref 0.0–13.0)
NEUT#: 3.3 10*3/uL (ref 1.5–6.5)
NEUT%: 76 % (ref 40.0–80.0)
PLATELETS: 181 10*3/uL (ref 145–400)
RBC: 2.88 10*6/uL — ABNORMAL LOW (ref 4.20–5.70)
RDW: 13.6 % (ref 11.1–15.7)
WBC: 4.3 10*3/uL (ref 4.0–10.0)

## 2017-03-06 MED ORDER — SODIUM CHLORIDE 0.9 % IV SOLN
Freq: Once | INTRAVENOUS | Status: AC
  Administered 2017-03-06: 15:00:00 via INTRAVENOUS

## 2017-03-06 MED ORDER — DEXAMETHASONE SODIUM PHOSPHATE 10 MG/ML IJ SOLN
10.0000 mg | Freq: Once | INTRAMUSCULAR | Status: AC
  Administered 2017-03-06: 10 mg via INTRAVENOUS

## 2017-03-06 MED ORDER — DEXTROSE 5 % IV SOLN
60.0000 mg | Freq: Once | INTRAVENOUS | Status: AC
  Administered 2017-03-06: 60 mg via INTRAVENOUS
  Filled 2017-03-06: qty 30

## 2017-03-06 MED ORDER — DEXAMETHASONE SODIUM PHOSPHATE 10 MG/ML IJ SOLN
INTRAMUSCULAR | Status: AC
  Filled 2017-03-06: qty 1

## 2017-03-06 NOTE — Progress Notes (Signed)
Hematology and Oncology Follow Up Visit  Christian Burns [id number] 19-Sep-1929 83 y.o. 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 His wife is now out of rehabilitation center. She is home. They live in assisted living. He really needs to help her out. She still is pretty much full-time care.  He has done well with his treatments. However, it looks like we might be seeing a resistance developing. We last saw him back in early July, his M spike was 0.3 g/dL. His IgG level was up to 5 57 mg/dL. His lambda light chain also increased up to 4.5 mg/dL.   He still has the hernia issue. He does have some numbness in his feet from neuropathy and also from the plasmacytoma that he has had back at [facility].   His weight just is not going up. He says he eats. He never has been a "big person."   He's had no bleeding. He's had no fever. He's had no cough or shortness of breath.   There's been no issues with bowels or bladder.  Overall, his performance status is ECOG 2  Medications:  Allergies as of 03/06/2017      Reactions   Sulfa Antibiotics Itching      Medication List       Accurate as of 03/06/17  2:25 PM. Always use your most recent med list.          amLODipine-benazepril 5-10 MG capsule Commonly known as:  LOTREL Take 1 capsule by mouth daily.   aspirin EC 81 MG tablet Take 81 mg by mouth daily.   CALCIUM 600 + D PO Take by mouth every morning.   HYDROcodone-acetaminophen 5-325 MG tablet Commonly known as:   NORCO/VICODIN Take 1 tablet by mouth every 6 (six) hours as needed for moderate pain.   LORazepam 0.5 MG tablet Commonly known as:  ATIVAN Take 1 tablet (0.5 mg total) by mouth every 6 (six) hours as needed (Nausea or vomiting).   MULTIVITAL PO Take 1 tablet by mouth every morning.   ondansetron 8 MG tablet Commonly known as:  ZOFRAN Take 1 tablet (8 mg total) by mouth 2 (two) times daily as needed for refractory nausea / vomiting. Take as needed on days 4-7, 11-14, 18-28.   prochlorperazine 10 MG tablet Commonly known as:  COMPAZINE Take 1 tablet (10 mg total) by mouth every 6 (six) hours as needed (Nausea or vomiting).   VITAMIN B 12 PO Take 1,000 mcg by mouth every morning.   vitamin B-6 250 MG tablet Take 500 mg by mouth daily.   Vitamin D (Ergocalciferol) 50000 units Caps capsule Commonly known as:  DRISDOL Take 50,000 Units by mouth every 7 (seven) days. Sundays       Allergies:  Allergies  Allergen Reactions  . Sulfa Antibiotics Itching    Past Medical History, Surgical history, Social history, and Family History were reviewed and updated.  Review of Systems: All other 10 point review of systems is negative.   Physical Exam:  weight is 137  lb (62.1 kg). His oral temperature is 97.9 F (36.6 C). His blood pressure is 155/73 (abnormal) and his pulse is 67. His respiration is 16 and oxygen saturation is 100%.   Wt Readings from Last 3 Encounters:  03/06/17 137 lb (62.1 kg)  02/20/17 138 lb (62.6 kg)  02/03/17 130 lb (59 kg)    Thin, elderly gentleman. He is no distress. Head and neck exam shows no ocular or oral lesions. He has no palpable cervical or supraclavicular lymph nodes. Lungs are clear bilaterally. Cardiac exam is regular rate and rhythm with no murmurs rubs or bruits. Abdomen is soft. He has good bowel sounds. There is no fluid wave. There is no palpable liver or spleen. Back exam no tenderness over the spine ribs or hips. He has some kyphosis.  Extremities shows some age related changes. He has good range of motion and strength. Skin exam shows no rashes. Neurological exam is nonfocal.ed  Lab Results  Component Value Date   WBC 4.3 03/06/2017   HGB 9.4 (L) 03/06/2017   HCT 27.3 (L) 03/06/2017   MCV 95 03/06/2017   PLT 181 03/06/2017   Lab Results  Component Value Date   FERRITIN 175 01/23/2017   IRON 54 01/23/2017   TIBC 243 01/23/2017   UIBC 188 01/23/2017   IRONPCTSAT 22 01/23/2017   Lab Results  Component Value Date   RBC 2.88 (L) 03/06/2017   Lab Results  Component Value Date   KPAFRELGTCHN 3.11 (H) 07/28/2015   LAMBDASER 37.90 (H) 07/28/2015   KAPLAMBRATIO 0.36 02/20/2017   Lab Results  Component Value Date   IGGSERUM 557 (L) 02/20/2017   IGA 204 07/28/2015   IGMSERUM 33 02/20/2017   Lab Results  Component Value Date   TOTALPROTELP 7.9 07/28/2015   ALBUMINELP 3.7 (L) 07/28/2015   A1GS 0.3 07/28/2015   A2GS 0.7 07/28/2015   BETS 0.4 07/28/2015   BETA2SER 0.3 07/28/2015   GAMS 2.5 (H) 07/28/2015   MSPIKE 0.3 (H) 02/20/2017   SPEI * 07/28/2015     Chemistry      Component Value Date/Time   NA 138 03/06/2017 1316   NA 141 01/09/2017 1004   K 4.6 03/06/2017 1316   K 4.4 01/09/2017 1004   CL 109 (H) 03/06/2017 1316   CO2 25 03/06/2017 1316   CO2 22 01/09/2017 1004   BUN 32 (H) 03/06/2017 1316   BUN 23.9 01/09/2017 1004   CREATININE 1.6 (H) 03/06/2017 1316   CREATININE 1.5 (H) 01/09/2017 1004      Component Value Date/Time   CALCIUM 8.9 03/06/2017 1316   CALCIUM 8.8 01/09/2017 1004   ALKPHOS 71 03/06/2017 1316   ALKPHOS 80 01/09/2017 1004   AST 28 03/06/2017 1316   AST 18 01/09/2017 1004   ALT 24 03/06/2017 1316   ALT 12 01/09/2017 1004   BILITOT 1.10 03/06/2017 1316   BILITOT 0.92 01/09/2017 1004      Impression and Plan: Christian Burns is a very pleasant 83 yo caucasian gentleman with IgG lambda myeloma. He has done well on Kyprolis so far and his myeloma counts have remained stable. We  have rechecked these today.   I notice that his myeloma studies have creeped up. We will have to be very careful about this   I think if his myeloma studies keep going up, we may have to re-introduce the Cytoxan.   We will plan to get him back here in another couple weeks.    [name], MD 7/26/20182:25  PM

## 2017-03-06 NOTE — Patient Instructions (Signed)
Cancer Center Discharge Instructions for Patients Receiving Chemotherapy  Today you received the following chemotherapy agents:  Kyprolis  To help prevent nausea and vomiting after your treatment, we encourage you to take your nausea medication as ordered per MD.   If you develop nausea and vomiting that is not controlled by your nausea medication, call the clinic.   BELOW ARE SYMPTOMS THAT SHOULD BE REPORTED IMMEDIATELY:  *FEVER GREATER THAN 100.5 F  *CHILLS WITH OR WITHOUT FEVER  NAUSEA AND VOMITING THAT IS NOT CONTROLLED WITH YOUR NAUSEA MEDICATION  *UNUSUAL SHORTNESS OF BREATH  *UNUSUAL BRUISING OR BLEEDING  TENDERNESS IN MOUTH AND THROAT WITH OR WITHOUT PRESENCE OF ULCERS  *URINARY PROBLEMS  *BOWEL PROBLEMS  UNUSUAL RASH Items with * indicate a potential emergency and should be followed up as soon as possible.  Feel free to call the clinic you have any questions or concerns. The clinic phone number is [phone].  Please show the CHEMO ALERT CARD at check-in to the Emergency Department and triage nurse.   

## 2017-03-07 LAB — IRON AND TIBC
%SAT: 39 % (ref 20–55)
Iron: 99 ug/dL (ref 42–163)
TIBC: 256 ug/dL (ref 202–409)
UIBC: 157 ug/dL (ref 117–376)

## 2017-03-07 LAB — IGG, IGA, IGM
IGA/IMMUNOGLOBULIN A, SERUM: 87 mg/dL (ref 61–437)
IGM (IMMUNOGLOBIN M), SRM: 31 mg/dL (ref 15–143)
IgG, Qn, Serum: 584 mg/dL — ABNORMAL LOW (ref 700–1600)

## 2017-03-07 LAB — KAPPA/LAMBDA LIGHT CHAINS
IG KAPPA FREE LIGHT CHAIN: 15.9 mg/L (ref 3.3–19.4)
IG LAMBDA FREE LIGHT CHAIN: 47.5 mg/L — AB (ref 5.7–26.3)
KAPPA/LAMBDA FLC RATIO: 0.33 (ref 0.26–1.65)

## 2017-03-07 LAB — FERRITIN: Ferritin: 131 ng/ml (ref 22–316)

## 2017-03-09 LAB — PROTEIN ELECTROPHORESIS, SERUM
A/G Ratio: 1.2 (ref 0.7–1.7)
ALBUMIN: 3 g/dL (ref 2.9–4.4)
ALPHA 1: 0.3 g/dL (ref 0.0–0.4)
ALPHA 2: 0.8 g/dL (ref 0.4–1.0)
BETA: 0.9 g/dL (ref 0.7–1.3)
GAMMA GLOBULIN: 0.6 g/dL (ref 0.4–1.8)
Globulin, Total: 2.5 g/dL (ref 2.2–3.9)
M-Spike, %: 0.4 g/dL — ABNORMAL HIGH
Total Protein: 5.5 g/dL — ABNORMAL LOW (ref 6.0–8.5)

## 2017-03-19 ENCOUNTER — Telehealth: Payer: Self-pay | Admitting: *Deleted

## 2017-03-19 NOTE — Telephone Encounter (Signed)
Patient is calling with continued complaints of pain in his mouth. Patient has been treated for an abscess since June without complete resolution of symptoms. He states he has continued sensitivity, and some pain in the same site.  Patient's abscess is being treated by Dr Redmond Baseman. They have scanned and treated with medication. Patient directed to to call Dr Redmond Baseman office with his concerns as they have been the treating physician since June.   Patient understands and will call that office.

## 2017-03-20 ENCOUNTER — Other Ambulatory Visit: Payer: Medicare HMO

## 2017-03-20 ENCOUNTER — Ambulatory Visit: Payer: Medicare HMO

## 2017-03-20 ENCOUNTER — Ambulatory Visit: Payer: Medicare HMO | Admitting: Hematology & Oncology

## 2017-03-20 DIAGNOSIS — L0201 Cutaneous abscess of face: Secondary | ICD-10-CM | POA: Diagnosis not present

## 2017-03-21 ENCOUNTER — Ambulatory Visit (HOSPITAL_BASED_OUTPATIENT_CLINIC_OR_DEPARTMENT_OTHER): Payer: Medicare HMO

## 2017-03-21 ENCOUNTER — Ambulatory Visit (HOSPITAL_BASED_OUTPATIENT_CLINIC_OR_DEPARTMENT_OTHER): Payer: Medicare HMO | Admitting: Hematology & Oncology

## 2017-03-21 ENCOUNTER — Other Ambulatory Visit (HOSPITAL_BASED_OUTPATIENT_CLINIC_OR_DEPARTMENT_OTHER): Payer: Medicare HMO

## 2017-03-21 VITALS — BP 122/60 | HR 72 | Temp 98.3°F | Resp 16 | Wt 141.0 lb

## 2017-03-21 DIAGNOSIS — C9002 Multiple myeloma in relapse: Secondary | ICD-10-CM

## 2017-03-21 DIAGNOSIS — Z5112 Encounter for antineoplastic immunotherapy: Secondary | ICD-10-CM

## 2017-03-21 DIAGNOSIS — R634 Abnormal weight loss: Secondary | ICD-10-CM | POA: Diagnosis not present

## 2017-03-21 DIAGNOSIS — G629 Polyneuropathy, unspecified: Secondary | ICD-10-CM

## 2017-03-21 DIAGNOSIS — C9000 Multiple myeloma not having achieved remission: Secondary | ICD-10-CM

## 2017-03-21 LAB — CBC WITH DIFFERENTIAL (CANCER CENTER ONLY)
BASO#: 0 10*3/uL (ref 0.0–0.2)
BASO%: 0.4 % (ref 0.0–2.0)
EOS ABS: 0.2 10*3/uL (ref 0.0–0.5)
EOS%: 2 % (ref 0.0–7.0)
HEMATOCRIT: 25.6 % — AB (ref 38.7–49.9)
HEMOGLOBIN: 9 g/dL — AB (ref 13.0–17.1)
LYMPH#: 0.4 10*3/uL — ABNORMAL LOW (ref 0.9–3.3)
LYMPH%: 5.3 % — ABNORMAL LOW (ref 14.0–48.0)
MCH: 32.7 pg (ref 28.0–33.4)
MCHC: 35.2 g/dL (ref 32.0–35.9)
MCV: 93 fL (ref 82–98)
MONO#: 0.8 10*3/uL (ref 0.1–0.9)
MONO%: 9.9 % (ref 0.0–13.0)
NEUT%: 82.4 % — AB (ref 40.0–80.0)
NEUTROS ABS: 6.3 10*3/uL (ref 1.5–6.5)
Platelets: 166 10*3/uL (ref 145–400)
RBC: 2.75 10*6/uL — ABNORMAL LOW (ref 4.20–5.70)
RDW: 13.4 % (ref 11.1–15.7)
WBC: 7.6 10*3/uL (ref 4.0–10.0)

## 2017-03-21 LAB — CMP (CANCER CENTER ONLY)
ALBUMIN: 3 g/dL — AB (ref 3.3–5.5)
ALT(SGPT): 20 U/L (ref 10–47)
AST: 28 U/L (ref 11–38)
Alkaline Phosphatase: 78 U/L (ref 26–84)
BUN, Bld: 24 mg/dL — ABNORMAL HIGH (ref 7–22)
CALCIUM: 8.5 mg/dL (ref 8.0–10.3)
CO2: 25 meq/L (ref 18–33)
Chloride: 107 mEq/L (ref 98–108)
Creat: 1.5 mg/dl — ABNORMAL HIGH (ref 0.6–1.2)
Glucose, Bld: 160 mg/dL — ABNORMAL HIGH (ref 73–118)
Potassium: 4.2 mEq/L (ref 3.3–4.7)
SODIUM: 139 meq/L (ref 128–145)
Total Bilirubin: 1.1 mg/dl (ref 0.20–1.60)
Total Protein: 6 g/dL — ABNORMAL LOW (ref 6.4–8.1)

## 2017-03-21 MED ORDER — CARFILZOMIB CHEMO INJECTION 60 MG
60.0000 mg | Freq: Once | INTRAVENOUS | Status: AC
  Administered 2017-03-21: 60 mg via INTRAVENOUS
  Filled 2017-03-21: qty 30

## 2017-03-21 MED ORDER — DEXAMETHASONE SODIUM PHOSPHATE 10 MG/ML IJ SOLN
10.0000 mg | Freq: Once | INTRAMUSCULAR | Status: AC
  Administered 2017-03-21: 10 mg via INTRAVENOUS

## 2017-03-21 MED ORDER — DEXAMETHASONE SODIUM PHOSPHATE 10 MG/ML IJ SOLN
INTRAMUSCULAR | Status: AC
  Filled 2017-03-21: qty 1

## 2017-03-21 MED ORDER — SODIUM CHLORIDE 0.9 % IV SOLN
Freq: Once | INTRAVENOUS | Status: AC
  Administered 2017-03-21: 14:00:00 via INTRAVENOUS

## 2017-03-21 NOTE — Progress Notes (Signed)
Hematology and Oncology Follow Up Visit  Christian Burns [id number] January 28, 1930 83 y.o. 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 His wife is now out of rehabilitation center. She is home. They live in assisted living. He really needs to help her out. She still is pretty much full-time care.  Unfortunately, it looks like he may have another issue with an abscess in the oral cavity. He saw Dr. Redmond Baseman of otolaryngology. He went ahead and put him on clindamycin.  He has done well with his treatments. However, it looks like we might be seeing a resistance developing. We last saw him back in July, his M spike was 0.4 g/dL. His IgG level was up to 584 mg/dL. His lambda light chain also increased up to 4.8 mg/dL.   He still has the hernia issue. He does have some numbness in his feet from neuropathy and also from the plasmacytoma that he has had back at L4.   His weight just is not going up. He says he eats. He never has been a "big person."   He's had no bleeding. He's had no fever. He's had no cough or shortness of breath.   There's been no issues with bowels or bladder.  Overall, his performance status is ECOG 2  Medications:  Allergies as of 03/21/2017      Reactions   Sulfa Antibiotics Itching      Medication List       Accurate as of 03/21/17  1:30 PM. Always use your most recent med list.          amLODipine-benazepril 5-10 MG capsule Commonly known as:  LOTREL Take 1 capsule by mouth daily.   aspirin EC 81  MG tablet Take 81 mg by mouth daily.   CALCIUM 600 + D PO Take by mouth every morning.   clindamycin 300 MG capsule Commonly known as:  CLEOCIN Take 300 mg by mouth.   HYDROcodone-acetaminophen 5-325 MG tablet Commonly known as:  NORCO/VICODIN Take 1 tablet by mouth every 6 (six) hours as needed for moderate pain.   LORazepam 0.5 MG tablet Commonly known as:  ATIVAN Take 1 tablet (0.5 mg total) by mouth every 6 (six) hours as needed (Nausea or vomiting).   MULTIVITAL PO Take 1 tablet by mouth every morning.   VITAMIN B 12 PO Take 1,000 mcg by mouth every morning.   vitamin B-6 250 MG tablet Take 500 mg by mouth daily.   Vitamin D (Ergocalciferol) 50000 units Caps capsule Commonly known as:  DRISDOL Take 50,000 Units by mouth every 7 (seven) days. Sundays       Allergies:  Allergies  Allergen Reactions  . Sulfa Antibiotics Itching    Past Medical History, Surgical history, Social history, and Family History were reviewed and updated.  Review of Systems: All other 10 point review of systems is negative.   Physical Exam:  weight is 141 lb (64 kg). His oral temperature is 98.3 F (36.8 C). His blood pressure is 122/60 and his pulse is 72.  His respiration is 16 and oxygen saturation is 100%.   Wt Readings from Last 3 Encounters:  03/21/17 141 lb (64 kg)  03/06/17 137 lb (62.1 kg)  02/20/17 138 lb (62.6 kg)    Thin, elderly gentleman. He is no distress. Head and neck exam shows no ocular or oral lesions. He has no palpable cervical or supraclavicular lymph nodes. Lungs are clear bilaterally. Cardiac exam is regular rate and rhythm with no murmurs rubs or bruits. Abdomen is soft. He has good bowel sounds. There is no fluid wave. There is no palpable liver or spleen. Back exam no tenderness over the spine ribs or hips. He has some kyphosis. Extremities shows some age related changes. He has good range of motion and strength. Skin exam shows no rashes. Neurological exam  is nonfocal.ed  Lab Results  Component Value Date   WBC 7.6 03/21/2017   HGB 9.0 (L) 03/21/2017   HCT 25.6 (L) 03/21/2017   MCV 93 03/21/2017   PLT 166 03/21/2017   Lab Results  Component Value Date   FERRITIN 131 03/06/2017   IRON 99 03/06/2017   TIBC 256 03/06/2017   UIBC 157 03/06/2017   IRONPCTSAT 39 03/06/2017   Lab Results  Component Value Date   RBC 2.75 (L) 03/21/2017   Lab Results  Component Value Date   KPAFRELGTCHN 3.11 (H) 07/28/2015   LAMBDASER 37.90 (H) 07/28/2015   KAPLAMBRATIO 0.33 03/06/2017   Lab Results  Component Value Date   IGGSERUM 584 (L) 03/06/2017   IGA 204 07/28/2015   IGMSERUM 31 03/06/2017   Lab Results  Component Value Date   TOTALPROTELP 7.9 07/28/2015   ALBUMINELP 3.7 (L) 07/28/2015   A1GS 0.3 07/28/2015   A2GS 0.7 07/28/2015   BETS 0.4 07/28/2015   BETA2SER 0.3 07/28/2015   GAMS 2.5 (H) 07/28/2015   MSPIKE 0.4 (H) 03/06/2017   SPEI * 07/28/2015     Chemistry      Component Value Date/Time   NA 139 03/21/2017 1251   NA 141 01/09/2017 1004   K 4.2 03/21/2017 1251   K 4.4 01/09/2017 1004   CL 107 03/21/2017 1251   CO2 25 03/21/2017 1251   CO2 22 01/09/2017 1004   BUN 24 (H) 03/21/2017 1251   BUN 23.9 01/09/2017 1004   CREATININE 1.5 (H) 03/21/2017 1251   CREATININE 1.5 (H) 01/09/2017 1004      Component Value Date/Time   CALCIUM 8.5 03/21/2017 1251   CALCIUM 8.8 01/09/2017 1004   ALKPHOS 78 03/21/2017 1251   ALKPHOS 80 01/09/2017 1004   AST 28 03/21/2017 1251   AST 18 01/09/2017 1004   ALT 20 03/21/2017 1251   ALT 12 01/09/2017 1004   BILITOT 1.10 03/21/2017 1251   BILITOT 0.92 01/09/2017 1004      Impression and Plan: Christian Burns is a very pleasant 83 yo caucasian gentleman with IgG lambda myeloma. He has done well on Kyprolis so far and his myeloma counts have remained eelatively stable. We have rechecked these today.   I think that if his myeloma studies have creeped up any further, we will have to make an  adjustment with his protocol. We likely will add Cytoxan. I may also consider increasing the dose of Kyprolis. I would keep him on every other week Kyprolis. We may have to consider weekly therapy depending on his myeloma studies..   We will plan to get him back here in another couple weeks.    [name], MD 8/10/20181:30 PM

## 2017-03-21 NOTE — Patient Instructions (Signed)
Carfilzomib injection What is this medicine? CARFILZOMIB (kar FILZ oh mib) targets a specific protein within cancer cells and stops the cancer cells from growing. It is used to treat multiple myeloma. This medicine may be used for other purposes; ask your health care provider or pharmacist if you have questions. COMMON BRAND NAME(S): KYPROLIS What should I tell my health care provider before I take this medicine? They need to know if you have any of these conditions: -heart disease -history of blood clots -irregular heartbeat -kidney disease -liver disease -lung or breathing disease -an unusual or allergic reaction to carfilzomib, or other medicines, foods, dyes, or preservatives -pregnant or trying to get pregnant -breast-feeding How should I use this medicine? This medicine is for injection or infusion into a vein. It is given by a health care professional in a hospital or clinic setting. Talk to your pediatrician regarding the use of this medicine in children. Special care may be needed. Overdosage: If you think you have taken too much of this medicine contact a poison control center or emergency room at once. NOTE: This medicine is only for you. Do not share this medicine with others. What if I miss a dose? It is important not to miss your dose. Call your doctor or health care professional if you are unable to keep an appointment. What may interact with this medicine? Interactions are not expected. Give your health care provider a list of all the medicines, herbs, non-prescription drugs, or dietary supplements you use. Also tell them if you smoke, drink alcohol, or use illegal drugs. Some items may interact with your medicine. This list may not describe all possible interactions. Give your health care provider a list of all the medicines, herbs, non-prescription drugs, or dietary supplements you use. Also tell them if you smoke, drink alcohol, or use illegal drugs. Some items may  interact with your medicine. What should I watch for while using this medicine? Your condition will be monitored carefully while you are receiving this medicine. Report any side effects. Continue your course of treatment even though you feel ill unless your doctor tells you to stop. You may need blood work done while you are taking this medicine. Do not become pregnant while taking this medicine or for at least 30 days after stopping it. Women should inform their doctor if they wish to become pregnant or think they might be pregnant. There is a potential for serious side effects to an unborn child. Men should not father a child while taking this medicine and for 90 days after stopping it. Talk to your health care professional or pharmacist for more information. Do not breast-feed an infant while taking this medicine. Check with your doctor or health care professional if you get an attack of severe diarrhea, nausea and vomiting, or if you sweat a lot. The loss of too much body fluid can make it dangerous for you to take this medicine. You may get dizzy. Do not drive, use machinery, or do anything that needs mental alertness until you know how this medicine affects you. Do not stand or sit up quickly, especially if you are an older patient. This reduces the risk of dizzy or fainting spells. What side effects may I notice from receiving this medicine? Side effects that you should report to your doctor or health care professional as soon as possible: -allergic reactions like skin rash, itching or hives, swelling of the face, lips, or tongue -confusion -dizziness -feeling faint or lightheaded -fever or chills -  palpitations -seizures -signs and symptoms of bleeding such as bloody or black, tarry stools; red or dark-brown urine; spitting up blood or brown material that looks like coffee grounds; red spots on the skin; unusual bruising or bleeding including from the eye, gums, or nose -signs and symptoms of  a blood clot such as breathing problems; changes in vision; chest pain; severe, sudden headache; pain, swelling, warmth in the leg; trouble speaking; sudden numbness or weakness of the face, arm or leg -signs and symptoms of kidney injury like trouble passing urine or change in the amount of urine -signs and symptoms of liver injury like dark yellow or brown urine; general ill feeling or flu-like symptoms; light-colored stools; loss of appetite; nausea; right upper belly pain; unusually weak or tired; yellowing of the eyes or skin Side effects that usually do not require medical attention (report to your doctor or health care professional if they continue or are bothersome): -back pain -cough -diarrhea -headache -muscle cramps -vomiting This list may not describe all possible side effects. Call your doctor for medical advice about side effects. You may report side effects to FDA at 1-800-FDA-1088. Where should I keep my medicine? This drug is given in a hospital or clinic and will not be stored at home. NOTE: This sheet is a summary. It may not cover all possible information. If you have questions about this medicine, talk to your doctor, pharmacist, or health care provider.  2018 Elsevier/Gold Standard (2015-08-31 13:39:23)  

## 2017-03-22 LAB — IGG, IGA, IGM
IGA/IMMUNOGLOBULIN A, SERUM: 79 mg/dL (ref 61–437)
IGG (IMMUNOGLOBIN G), SERUM: 575 mg/dL — AB (ref 700–1600)
IgM, Qn, Serum: 28 mg/dL (ref 15–143)

## 2017-03-24 LAB — KAPPA/LAMBDA LIGHT CHAINS
IG KAPPA FREE LIGHT CHAIN: 15.2 mg/L (ref 3.3–19.4)
Ig Lambda Free Light Chain: 40.5 mg/L — ABNORMAL HIGH (ref 5.7–26.3)
KAPPA/LAMBDA FLC RATIO: 0.38 (ref 0.26–1.65)

## 2017-03-25 DIAGNOSIS — L0201 Cutaneous abscess of face: Secondary | ICD-10-CM | POA: Diagnosis not present

## 2017-03-25 LAB — PROTEIN ELECTROPHORESIS, SERUM, WITH REFLEX
A/G RATIO SPE: 1.4 (ref 0.7–1.7)
ALPHA 2: 0.7 g/dL (ref 0.4–1.0)
Albumin: 3.2 g/dL (ref 2.9–4.4)
Alpha 1: 0.3 g/dL (ref 0.0–0.4)
Beta: 0.8 g/dL (ref 0.7–1.3)
GAMMA GLOBULIN: 0.6 g/dL (ref 0.4–1.8)
GLOBULIN, TOTAL: 2.3 g/dL (ref 2.2–3.9)
INTERPRETATION(SEE BELOW): 0
M-SPIKE, %: 0.3 g/dL — AB
Total Protein: 5.5 g/dL — ABNORMAL LOW (ref 6.0–8.5)

## 2017-04-04 ENCOUNTER — Other Ambulatory Visit: Payer: Medicare HMO

## 2017-04-04 ENCOUNTER — Other Ambulatory Visit (HOSPITAL_BASED_OUTPATIENT_CLINIC_OR_DEPARTMENT_OTHER): Payer: Medicare HMO

## 2017-04-04 ENCOUNTER — Ambulatory Visit: Payer: Medicare HMO | Admitting: Hematology & Oncology

## 2017-04-04 ENCOUNTER — Ambulatory Visit (HOSPITAL_BASED_OUTPATIENT_CLINIC_OR_DEPARTMENT_OTHER): Payer: Medicare HMO

## 2017-04-04 ENCOUNTER — Ambulatory Visit (HOSPITAL_BASED_OUTPATIENT_CLINIC_OR_DEPARTMENT_OTHER): Payer: Medicare HMO | Admitting: Hematology & Oncology

## 2017-04-04 ENCOUNTER — Ambulatory Visit: Payer: Medicare HMO

## 2017-04-04 VITALS — BP 132/62 | HR 75 | Temp 98.0°F | Resp 18 | Wt 140.0 lb

## 2017-04-04 DIAGNOSIS — C9000 Multiple myeloma not having achieved remission: Secondary | ICD-10-CM

## 2017-04-04 DIAGNOSIS — Z5112 Encounter for antineoplastic immunotherapy: Secondary | ICD-10-CM | POA: Diagnosis not present

## 2017-04-04 DIAGNOSIS — C9002 Multiple myeloma in relapse: Secondary | ICD-10-CM

## 2017-04-04 LAB — CMP (CANCER CENTER ONLY)
ALBUMIN: 3.1 g/dL — AB (ref 3.3–5.5)
ALK PHOS: 81 U/L (ref 26–84)
ALT: 23 U/L (ref 10–47)
AST: 29 U/L (ref 11–38)
BILIRUBIN TOTAL: 1 mg/dL (ref 0.20–1.60)
BUN, Bld: 33 mg/dL — ABNORMAL HIGH (ref 7–22)
CALCIUM: 8.9 mg/dL (ref 8.0–10.3)
CO2: 27 mEq/L (ref 18–33)
Chloride: 111 mEq/L — ABNORMAL HIGH (ref 98–108)
Creat: 1.5 mg/dl — ABNORMAL HIGH (ref 0.6–1.2)
GLUCOSE: 143 mg/dL — AB (ref 73–118)
POTASSIUM: 4.2 meq/L (ref 3.3–4.7)
Sodium: 145 mEq/L (ref 128–145)
TOTAL PROTEIN: 6.2 g/dL — AB (ref 6.4–8.1)

## 2017-04-04 LAB — CBC WITH DIFFERENTIAL (CANCER CENTER ONLY)
BASO#: 0 10*3/uL (ref 0.0–0.2)
BASO%: 0.4 % (ref 0.0–2.0)
EOS%: 2.2 % (ref 0.0–7.0)
Eosinophils Absolute: 0.2 10*3/uL (ref 0.0–0.5)
HEMATOCRIT: 26.5 % — AB (ref 38.7–49.9)
HEMOGLOBIN: 9.1 g/dL — AB (ref 13.0–17.1)
LYMPH#: 0.5 10*3/uL — AB (ref 0.9–3.3)
LYMPH%: 7 % — ABNORMAL LOW (ref 14.0–48.0)
MCH: 32.4 pg (ref 28.0–33.4)
MCHC: 34.3 g/dL (ref 32.0–35.9)
MCV: 94 fL (ref 82–98)
MONO#: 0.6 10*3/uL (ref 0.1–0.9)
MONO%: 7.8 % (ref 0.0–13.0)
NEUT%: 82.6 % — AB (ref 40.0–80.0)
NEUTROS ABS: 6.3 10*3/uL (ref 1.5–6.5)
Platelets: 194 10*3/uL (ref 145–400)
RBC: 2.81 10*6/uL — ABNORMAL LOW (ref 4.20–5.70)
RDW: 13.8 % (ref 11.1–15.7)
WBC: 7.6 10*3/uL (ref 4.0–10.0)

## 2017-04-04 MED ORDER — DEXAMETHASONE SODIUM PHOSPHATE 10 MG/ML IJ SOLN
INTRAMUSCULAR | Status: AC
  Filled 2017-04-04: qty 1

## 2017-04-04 MED ORDER — DEXAMETHASONE SODIUM PHOSPHATE 10 MG/ML IJ SOLN
10.0000 mg | Freq: Once | INTRAMUSCULAR | Status: AC
  Administered 2017-04-04: 10 mg via INTRAVENOUS

## 2017-04-04 MED ORDER — DEXTROSE 5 % IV SOLN
60.0000 mg | Freq: Once | INTRAVENOUS | Status: AC
  Administered 2017-04-04: 60 mg via INTRAVENOUS
  Filled 2017-04-04: qty 30

## 2017-04-04 MED ORDER — SODIUM CHLORIDE 0.9 % IV SOLN
Freq: Once | INTRAVENOUS | Status: AC
  Administered 2017-04-04: 15:00:00 via INTRAVENOUS

## 2017-04-04 NOTE — Patient Instructions (Signed)
[facility] Discharge Instructions for Patients Receiving Chemotherapy  Today you received the following chemotherapy agents:  Kyprolis  To help prevent nausea and vomiting after your treatment, we encourage you to take your nausea medication as ordered per MD.   If you develop nausea and vomiting that is not controlled by your nausea medication, call the clinic.   BELOW ARE SYMPTOMS THAT SHOULD BE REPORTED IMMEDIATELY:  *FEVER GREATER THAN 100.5 F  *CHILLS WITH OR WITHOUT FEVER  NAUSEA AND VOMITING THAT IS NOT CONTROLLED WITH YOUR NAUSEA MEDICATION  *UNUSUAL SHORTNESS OF BREATH  *UNUSUAL BRUISING OR BLEEDING  TENDERNESS IN MOUTH AND THROAT WITH OR WITHOUT PRESENCE OF ULCERS  *URINARY PROBLEMS  *BOWEL PROBLEMS  UNUSUAL RASH Items with * indicate a potential emergency and should be followed up as soon as possible.  Feel free to call the clinic you have any questions or concerns. The clinic phone number is [phone].  Please show the CHEMO ALERT CARD at check-in to the Emergency Department and triage nurse.   

## 2017-04-04 NOTE — Progress Notes (Signed)
Hematology and Oncology Follow Up Visit  Christian Burns [id number] 1929-11-17 83 y.o. 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 He seems be doing better. Christian weight is stable. He says he is going to start eating ice cream.   Christian Burns seems be doing a little bit better. He is doing Christian best to give her a good quality of life.  Thankfully, Christian last myeloma studies looked better. Christian M spike was 0.3 g/dL. Christian IgG level was 575 mg/dL. Christian lambda light chain was 4.1 mg/dL.  He's had not had any fever. He's had no rashes. He still has some tingling in Christian feet. He has this neuropathy. This probably is more so from Christian plasmacytoma in the T12 body.  He's had no cough. There is no shortness of breath. He's had no bleeding. There is no change in bowel or bladder habits.   Overall, Christian performance status is ECOG 2.  Medications:  Allergies as of 04/04/2017      Reactions   Sulfa Antibiotics Itching      Medication List       Accurate as of 04/04/17  2:34 PM. Always use your most recent med list.          amLODipine-benazepril 5-10 MG capsule Commonly known as:  LOTREL Take 1 capsule by mouth daily.   aspirin EC 81 MG tablet Take 81 mg by mouth daily.   CALCIUM 600 + D PO Take by mouth every morning.   HYDROcodone-acetaminophen 5-325 MG tablet Commonly known as:  NORCO/VICODIN Take 1 tablet by mouth every 6 (six) hours as needed for moderate pain.   LORazepam 0.5 MG tablet Commonly known as:  ATIVAN Take 1 tablet (0.5 mg  total) by mouth every 6 (six) hours as needed (Nausea or vomiting).   MULTIVITAL PO Take 1 tablet by mouth every morning.   VITAMIN B 12 PO Take 1,000 mcg by mouth every morning.   vitamin B-6 250 MG tablet Take 500 mg by mouth daily.   Vitamin D (Ergocalciferol) 50000 units Caps capsule Commonly known as:  DRISDOL Take 50,000 Units by mouth every 7 (seven) days. Sundays       Allergies:  Allergies  Allergen Reactions  . Sulfa Antibiotics Itching    Past Medical History, Surgical history, Social history, and Family History were reviewed and updated.  Review of Systems: Review of Systems  Constitutional: Negative for appetite change, fatigue, fever and unexpected weight change.  HENT:   Negative for lump/mass, mouth sores, sore throat and trouble swallowing.   Respiratory: Negative for cough, hemoptysis and shortness of breath.   Cardiovascular: Negative for leg swelling and palpitations.  Gastrointestinal: Negative for abdominal distention, abdominal pain, blood in stool, constipation, diarrhea, nausea and vomiting.  Genitourinary: Negative for bladder incontinence, dysuria, frequency and hematuria.   Musculoskeletal: Negative for arthralgias, back pain, gait problem and myalgias.  Skin: Negative for itching and rash.  Neurological: Negative for dizziness, extremity weakness, gait problem, headaches, numbness, seizures and speech difficulty.  Hematological: Does not bruise/bleed easily.  Psychiatric/Behavioral: Negative for depression and sleep disturbance. The patient is not nervous/anxious.      Physical Exam:  weight is 140 lb (63.5 kg). Christian oral temperature is 98 F (36.7 C). Christian blood pressure is 132/62 and Christian pulse is 75. Christian respiration is 18 and oxygen saturation is 98%.   Wt Readings from Last 3 Encounters:  04/04/17 140 lb (63.5 kg)  03/21/17 141 lb (64 kg)  03/06/17 137 lb (62.1 kg)    Physical Exam  Constitutional: He is oriented to person, place, and  time.  HENT:  Head: Normocephalic and atraumatic.  Mouth/Throat: Oropharynx is clear and moist.  Eyes: Pupils are equal, round, and reactive to light. EOM are normal.  Neck: Normal range of motion.  Cardiovascular: Normal rate, regular rhythm and normal heart sounds.   Pulmonary/Chest: Effort normal and breath sounds normal.  Abdominal: Soft. Bowel sounds are normal.  Musculoskeletal: Normal range of motion. He exhibits no edema, tenderness or deformity.  Lymphadenopathy:    He has no cervical adenopathy.  Neurological: He is alert and oriented to person, place, and time.  Skin: Skin is warm and dry. No rash noted. No erythema.  Psychiatric: He has a normal mood and affect. Christian behavior is normal. Judgment and thought content normal.  Vitals reviewed.   Lab Results  Component Value Date   WBC 7.6 04/04/2017   HGB 9.1 (L) 04/04/2017   HCT 26.5 (L) 04/04/2017   MCV 94 04/04/2017   PLT 194 04/04/2017   Lab Results  Component Value Date   FERRITIN 131 03/06/2017   IRON 99 03/06/2017   TIBC 256 03/06/2017   UIBC 157 03/06/2017   IRONPCTSAT 39 03/06/2017   Lab Results  Component Value Date   RBC 2.81 (L) 04/04/2017   Lab Results  Component Value Date   KPAFRELGTCHN 3.11 (H) 07/28/2015   LAMBDASER 37.90 (H) 07/28/2015   KAPLAMBRATIO 0.38 03/21/2017   Lab Results  Component Value Date   IGGSERUM 575 (L) 03/21/2017   IGA 204 07/28/2015   IGMSERUM 28 03/21/2017   Lab Results  Component Value Date   TOTALPROTELP 7.9 07/28/2015   ALBUMINELP 3.7 (L) 07/28/2015   A1GS 0.3 07/28/2015   A2GS 0.7 07/28/2015   BETS 0.4 07/28/2015   BETA2SER 0.3 07/28/2015   GAMS 2.5 (H) 07/28/2015   MSPIKE 0.3 (H) 03/21/2017   SPEI * 07/28/2015     Chemistry      Component Value Date/Time   NA 145 04/04/2017 1323   NA 141 01/09/2017 1004   K 4.2 04/04/2017 1323   K 4.4 01/09/2017 1004   CL 111 (H) 04/04/2017 1323   CO2 27 04/04/2017 1323   CO2 22 01/09/2017 1004   BUN 33 (H)  04/04/2017 1323   BUN 23.9 01/09/2017 1004   CREATININE 1.5 (H) 04/04/2017 1323   CREATININE 1.5 (H) 01/09/2017 1004      Component Value Date/Time   CALCIUM 8.9 04/04/2017 1323   CALCIUM 8.8 01/09/2017 1004   ALKPHOS 81 04/04/2017 1323   ALKPHOS 80 01/09/2017 1004   AST 29 04/04/2017 1323   AST 18 01/09/2017 1004   ALT 23 04/04/2017 1323   ALT 12 01/09/2017 1004   BILITOT 1.00 04/04/2017 1323   BILITOT 0.92 01/09/2017 1004      Impression and Plan: Mr. Kollmann is a very pleasant 83 yo caucasian gentleman with IgG lambda myeloma. He has done well on Kyprolis so far and Christian myeloma counts have remained relatively stable.  I'm just having that Christian quality of life is doing a little bit better. Over, Christian weight will go up.  We will continue him on every 2 week therapy. I don't see that we have to add anything to Christian protocol right now. If we needed to, we can re-add Cytoxan. He would be amenable to this.   We will plan to get him back in 2 weeks.  I still want to hold Christian Zometa.   [name], MD 8/24/20182:34 PM

## 2017-04-05 LAB — IGG, IGA, IGM
IGA/IMMUNOGLOBULIN A, SERUM: 84 mg/dL (ref 61–437)
IGG (IMMUNOGLOBIN G), SERUM: 580 mg/dL — AB (ref 700–1600)
IgM, Qn, Serum: 25 mg/dL (ref 15–143)

## 2017-04-07 LAB — KAPPA/LAMBDA LIGHT CHAINS
IG KAPPA FREE LIGHT CHAIN: 17.6 mg/L (ref 3.3–19.4)
IG LAMBDA FREE LIGHT CHAIN: 42.3 mg/L — AB (ref 5.7–26.3)
Kappa/Lambda FluidC Ratio: 0.42 (ref 0.26–1.65)

## 2017-04-08 ENCOUNTER — Other Ambulatory Visit: Payer: Self-pay | Admitting: Hematology & Oncology

## 2017-04-08 DIAGNOSIS — I10 Essential (primary) hypertension: Secondary | ICD-10-CM

## 2017-04-09 LAB — PROTEIN ELECTROPHORESIS, SERUM, WITH REFLEX
A/G Ratio: 1.4 (ref 0.7–1.7)
ALPHA 1: 0.3 g/dL (ref 0.0–0.4)
ALPHA 2: 0.7 g/dL (ref 0.4–1.0)
Albumin: 3.3 g/dL (ref 2.9–4.4)
Beta: 0.7 g/dL (ref 0.7–1.3)
Gamma Globulin: 0.6 g/dL (ref 0.4–1.8)
Globulin, Total: 2.3 g/dL (ref 2.2–3.9)
INTERPRETATION(SEE BELOW): 0
M-SPIKE, %: 0.4 g/dL — AB
Total Protein: 5.6 g/dL — ABNORMAL LOW (ref 6.0–8.5)

## 2017-04-15 ENCOUNTER — Other Ambulatory Visit: Payer: Self-pay | Admitting: *Deleted

## 2017-04-15 DIAGNOSIS — I10 Essential (primary) hypertension: Secondary | ICD-10-CM

## 2017-04-15 MED ORDER — AMLODIPINE BESY-BENAZEPRIL HCL 5-10 MG PO CAPS: 1.0000 | ORAL_CAPSULE | Freq: Every day | ORAL | 3 refills | Status: DC

## 2017-04-16 DIAGNOSIS — L814 Other melanin hyperpigmentation: Secondary | ICD-10-CM | POA: Diagnosis not present

## 2017-04-16 DIAGNOSIS — L821 Other seborrheic keratosis: Secondary | ICD-10-CM | POA: Diagnosis not present

## 2017-04-16 DIAGNOSIS — C44219 Basal cell carcinoma of skin of left ear and external auricular canal: Secondary | ICD-10-CM | POA: Diagnosis not present

## 2017-04-16 DIAGNOSIS — Z85828 Personal history of other malignant neoplasm of skin: Secondary | ICD-10-CM | POA: Diagnosis not present

## 2017-04-16 DIAGNOSIS — D1800 Hemangioma unspecified site: Secondary | ICD-10-CM | POA: Diagnosis not present

## 2017-04-16 DIAGNOSIS — D485 Neoplasm of uncertain behavior of skin: Secondary | ICD-10-CM | POA: Diagnosis not present

## 2017-04-16 DIAGNOSIS — C44319 Basal cell carcinoma of skin of other parts of face: Secondary | ICD-10-CM | POA: Diagnosis not present

## 2017-04-16 DIAGNOSIS — D225 Melanocytic nevi of trunk: Secondary | ICD-10-CM | POA: Diagnosis not present

## 2017-04-18 ENCOUNTER — Ambulatory Visit
Admission: RE | Admit: 2017-04-18 | Discharge: 2017-04-18 | Disposition: A | Discharge status: Home / Self Care | Payer: Medicare HMO | Source: Ambulatory Visit | Attending: Otolaryngology | Admitting: Otolaryngology

## 2017-04-18 ENCOUNTER — Other Ambulatory Visit: Payer: Self-pay | Admitting: Otolaryngology

## 2017-04-18 DIAGNOSIS — L0201 Cutaneous abscess of face: Secondary | ICD-10-CM | POA: Diagnosis not present

## 2017-04-18 DIAGNOSIS — M542 Cervicalgia: Secondary | ICD-10-CM | POA: Diagnosis not present

## 2017-04-18 MED ORDER — IOPAMIDOL (ISOVUE-300) INJECTION 61%
60.0000 mL | Freq: Once | INTRAVENOUS | Status: AC | PRN
  Administered 2017-04-18: 60 mL via INTRAVENOUS

## 2017-04-21 ENCOUNTER — Telehealth: Payer: Self-pay | Admitting: *Deleted

## 2017-04-21 NOTE — Telephone Encounter (Signed)
Received notification from Dr Redmond Baseman office that patient is still having problems with his abscess at his jaw. They are considering an I&D this week.   Dr Marin Olp would like to push patient's chemotherapy appointment back one week to allow for his treatment.   Patient informed of Dr Antonieta Pert plan. He is in agreement. Message sent to scheduler.

## 2017-04-23 DIAGNOSIS — K589 Irritable bowel syndrome without diarrhea: Secondary | ICD-10-CM | POA: Diagnosis not present

## 2017-04-23 DIAGNOSIS — R220 Localized swelling, mass and lump, head: Secondary | ICD-10-CM | POA: Diagnosis not present

## 2017-04-23 DIAGNOSIS — H9113 Presbycusis, bilateral: Secondary | ICD-10-CM | POA: Diagnosis not present

## 2017-04-23 DIAGNOSIS — C9000 Multiple myeloma not having achieved remission: Secondary | ICD-10-CM | POA: Diagnosis not present

## 2017-04-23 DIAGNOSIS — Z79899 Other long term (current) drug therapy: Secondary | ICD-10-CM | POA: Diagnosis not present

## 2017-04-23 DIAGNOSIS — H18413 Arcus senilis, bilateral: Secondary | ICD-10-CM | POA: Diagnosis not present

## 2017-04-23 DIAGNOSIS — Z0000 Encounter for general adult medical examination without abnormal findings: Secondary | ICD-10-CM | POA: Diagnosis not present

## 2017-04-23 DIAGNOSIS — I10 Essential (primary) hypertension: Secondary | ICD-10-CM | POA: Diagnosis not present

## 2017-04-23 DIAGNOSIS — C44319 Basal cell carcinoma of skin of other parts of face: Secondary | ICD-10-CM | POA: Diagnosis not present

## 2017-04-24 ENCOUNTER — Ambulatory Visit: Payer: Medicare HMO

## 2017-04-24 ENCOUNTER — Ambulatory Visit: Payer: Medicare HMO | Admitting: Hematology & Oncology

## 2017-04-24 ENCOUNTER — Other Ambulatory Visit: Payer: Medicare HMO

## 2017-04-29 ENCOUNTER — Other Ambulatory Visit: Payer: Self-pay | Admitting: Otolaryngology

## 2017-04-29 DIAGNOSIS — C44219 Basal cell carcinoma of skin of left ear and external auricular canal: Secondary | ICD-10-CM | POA: Diagnosis not present

## 2017-04-29 DIAGNOSIS — M272 Inflammatory conditions of jaws: Secondary | ICD-10-CM

## 2017-05-01 ENCOUNTER — Ambulatory Visit (HOSPITAL_BASED_OUTPATIENT_CLINIC_OR_DEPARTMENT_OTHER): Payer: Medicare HMO | Admitting: Hematology & Oncology

## 2017-05-01 ENCOUNTER — Ambulatory Visit (HOSPITAL_BASED_OUTPATIENT_CLINIC_OR_DEPARTMENT_OTHER): Payer: Medicare HMO

## 2017-05-01 ENCOUNTER — Other Ambulatory Visit: Payer: Self-pay

## 2017-05-01 ENCOUNTER — Other Ambulatory Visit (HOSPITAL_BASED_OUTPATIENT_CLINIC_OR_DEPARTMENT_OTHER): Payer: Medicare HMO

## 2017-05-01 ENCOUNTER — Other Ambulatory Visit (HOSPITAL_BASED_OUTPATIENT_CLINIC_OR_DEPARTMENT_OTHER): Payer: Self-pay

## 2017-05-01 ENCOUNTER — Ambulatory Visit (HOSPITAL_COMMUNITY)
Admission: RE | Admit: 2017-05-01 | Discharge: 2017-05-01 | Disposition: A | Discharge status: Home / Self Care | Payer: Medicare HMO | Source: Ambulatory Visit | Attending: Hematology & Oncology | Admitting: Hematology & Oncology

## 2017-05-01 VITALS — BP 120/59 | HR 114 | Temp 98.4°F | Resp 20 | Wt 134.4 lb

## 2017-05-01 DIAGNOSIS — C9000 Multiple myeloma not having achieved remission: Secondary | ICD-10-CM

## 2017-05-01 DIAGNOSIS — D508 Other iron deficiency anemias: Secondary | ICD-10-CM | POA: Diagnosis not present

## 2017-05-01 DIAGNOSIS — E860 Dehydration: Secondary | ICD-10-CM

## 2017-05-01 DIAGNOSIS — C9002 Multiple myeloma in relapse: Secondary | ICD-10-CM

## 2017-05-01 LAB — CBC WITH DIFFERENTIAL (CANCER CENTER ONLY)
BASO#: 0 10*3/uL (ref 0.0–0.2)
BASO%: 0 % (ref 0.0–2.0)
EOS ABS: 0 10*3/uL (ref 0.0–0.5)
EOS%: 0 % (ref 0.0–7.0)
HEMATOCRIT: 28.3 % — AB (ref 38.7–49.9)
HEMOGLOBIN: 9.8 g/dL — AB (ref 13.0–17.1)
LYMPH#: 0.5 10*3/uL — ABNORMAL LOW (ref 0.9–3.3)
LYMPH%: 1.4 % — ABNORMAL LOW (ref 14.0–48.0)
MCH: 31.4 pg (ref 28.0–33.4)
MCHC: 34.6 g/dL (ref 32.0–35.9)
MCV: 91 fL (ref 82–98)
MONO#: 2.4 10*3/uL — AB (ref 0.1–0.9)
MONO%: 7.7 % (ref 0.0–13.0)
NEUT%: 90.9 % — ABNORMAL HIGH (ref 40.0–80.0)
NEUTROS ABS: 28.9 10*3/uL — AB (ref 1.5–6.5)
Platelets: 237 10*3/uL (ref 145–400)
RBC: 3.12 10*6/uL — AB (ref 4.20–5.70)
RDW: 13.4 % (ref 11.1–15.7)
WBC: 31.8 10*3/uL — AB (ref 4.0–10.0)

## 2017-05-01 LAB — CMP (CANCER CENTER ONLY)
ALBUMIN: 2.4 g/dL — AB (ref 3.3–5.5)
ALT(SGPT): 25 U/L (ref 10–47)
AST: 21 U/L (ref 11–38)
Alkaline Phosphatase: 84 U/L (ref 26–84)
BUN, Bld: 46 mg/dL — ABNORMAL HIGH (ref 7–22)
CALCIUM: 7.9 mg/dL — AB (ref 8.0–10.3)
CHLORIDE: 106 meq/L (ref 98–108)
CO2: 19 meq/L (ref 18–33)
CREATININE: 2.5 mg/dL — AB (ref 0.6–1.2)
Glucose, Bld: 214 mg/dL — ABNORMAL HIGH (ref 73–118)
POTASSIUM: 3.4 meq/L (ref 3.3–4.7)
SODIUM: 137 meq/L (ref 128–145)
TOTAL PROTEIN: 5.8 g/dL — AB (ref 6.4–8.1)
Total Bilirubin: 1.1 mg/dl (ref 0.20–1.60)

## 2017-05-01 MED ORDER — SODIUM CHLORIDE 0.9 % IV SOLN
1.5000 g | Freq: Once | INTRAVENOUS | Status: AC
  Administered 2017-05-01: 1.5 g via INTRAVENOUS
  Filled 2017-05-01: qty 1.5

## 2017-05-01 MED ORDER — SODIUM CHLORIDE 0.9 % IV SOLN
INTRAVENOUS | Status: DC
  Administered 2017-05-01: 15:00:00 via INTRAVENOUS

## 2017-05-01 NOTE — Patient Instructions (Signed)
Ampicillin; Sulbactam injection What is this medicine? AMPICILLIN; SULBACTAM (am pi SILL in; sul BAK tam) is a penicillin antibiotic. It is used to treat certain kinds of bacterial infections. It will not work for colds, flu, or other viral infections. This medicine may be used for other purposes; ask your health care provider or pharmacist if you have questions. COMMON BRAND NAME(S): Unasyn What should I tell my health care provider before I take this medicine? They need to know if you have any of these conditions: -heart disease -kidney disease -mononucleosis -an unusual or allergic reaction to ampicillin, other penicillins or antibiotics, foods, dyes, or preservatives -pregnant or trying to get pregnant -breast-feeding How should I use this medicine? This medicine is infused into a vein or injected deep into a muscle. It is usually given by a health care professional in a hospital or clinic setting. If you get this medicine at home, you will be taught how to prepare and give this medicine. Use exactly as directed. Take your medicine at regular intervals. Do not take your medicine more often than directed. It is important that you put your used needles and syringes in a special sharps container. Do not put them in a trash can. If you do not have a sharps container, call your pharmacist or healthcare provider to get one. Talk to your pediatrician regarding the use of this medicine in children. Special care may be needed. Overdosage: If you think you have taken too much of this medicine contact a poison control center or emergency room at once. NOTE: This medicine is only for you. Do not share this medicine with others. What if I miss a dose? If you miss a dose, take it as soon as you can. If it is almost time for your next dose, take only that dose. Do not take double or extra doses. What may interact with this medicine? -allopurinol -male hormones, including contraceptive or birth control  pills -probenecid -some other antibiotics given by injection This list may not describe all possible interactions. Give your health care provider a list of all the medicines, herbs, non-prescription drugs, or dietary supplements you use. Also tell them if you smoke, drink alcohol, or use illegal drugs. Some items may interact with your medicine. What should I watch for while using this medicine? Tell your doctor or health care professional if your symptoms do not improve or if you get new symptoms. Do not treat diarrhea with over the counter products. Contact your doctor if you have diarrhea that lasts more than 2 days or if the diarrhea is severe and watery. This medicine can interfere with some urine glucose tests. If you use such tests, talk with your health care professional. Birth control pills may not work properly while you are taking this medicine. Talk to your doctor about using an extra method of birth control. What side effects may I notice from receiving this medicine? Side effects that you should report to your doctor or health care professional as soon as possible: -allergic reactions like skin rash or hives, swelling of the face, lips, or tongue -chest pain -difficulty breathing -fever, chills -pain or difficulty passing urine -redness, blistering, peeling or loosening of the skin, including inside the mouth -seizures -unusual bleeding, bruising -unusually weak or tired Side effects that usually do not require medical attention (report to your doctor or health care professional if they continue or are bothersome): -diarrhea -headache -heartburn -nausea, vomiting -pain, irritation at the site of injection -sore mouth, tongue -  stomach gas This list may not describe all possible side effects. Call your doctor for medical advice about side effects. You may report side effects to FDA at 1-800-FDA-1088. Where should I keep my medicine? Keep out of the reach of children. You  will be instructed on how to store this medicine. Throw away any unused medicine after the expiration date on the label. NOTE: This sheet is a summary. It may not cover all possible information. If you have questions about this medicine, talk to your doctor, pharmacist, or health care provider.  2018 Elsevier/Gold Standard (2007-10-20 14:37:17)  

## 2017-05-02 ENCOUNTER — Other Ambulatory Visit: Payer: Self-pay | Admitting: *Deleted

## 2017-05-02 ENCOUNTER — Ambulatory Visit (HOSPITAL_BASED_OUTPATIENT_CLINIC_OR_DEPARTMENT_OTHER): Payer: Medicare HMO

## 2017-05-02 DIAGNOSIS — C9002 Multiple myeloma in relapse: Secondary | ICD-10-CM

## 2017-05-02 DIAGNOSIS — C9000 Multiple myeloma not having achieved remission: Secondary | ICD-10-CM

## 2017-05-02 LAB — KAPPA/LAMBDA LIGHT CHAINS
IG LAMBDA FREE LIGHT CHAIN: 81.5 mg/L — AB (ref 5.7–26.3)
Ig Kappa Free Light Chain: 24 mg/L — ABNORMAL HIGH (ref 3.3–19.4)
KAPPA/LAMBDA FLC RATIO: 0.29 (ref 0.26–1.65)

## 2017-05-02 LAB — PREPARE RBC (CROSSMATCH)

## 2017-05-02 LAB — IGG, IGA, IGM
IgA, Qn, Serum: 72 mg/dL (ref 61–437)
IgG, Qn, Serum: 522 mg/dL — ABNORMAL LOW (ref 700–1600)
IgM, Qn, Serum: 22 mg/dL (ref 15–143)

## 2017-05-02 MED ORDER — SODIUM CHLORIDE 0.9 % IV SOLN
250.0000 mL | Freq: Once | INTRAVENOUS | Status: AC
  Administered 2017-05-02: 250 mL via INTRAVENOUS

## 2017-05-02 MED ORDER — DIPHENHYDRAMINE HCL 25 MG PO CAPS
ORAL_CAPSULE | ORAL | Status: AC
  Filled 2017-05-02: qty 1

## 2017-05-02 MED ORDER — ACETAMINOPHEN 325 MG PO TABS
650.0000 mg | ORAL_TABLET | Freq: Once | ORAL | Status: AC
  Administered 2017-05-02: 650 mg via ORAL

## 2017-05-02 MED ORDER — FUROSEMIDE 10 MG/ML IJ SOLN: 10.0000 mg | Freq: Once | INTRAMUSCULAR | Status: DC

## 2017-05-02 MED ORDER — DIPHENHYDRAMINE HCL 25 MG PO TABS
25.0000 mg | ORAL_TABLET | Freq: Once | ORAL | Status: AC
  Administered 2017-05-02: 25 mg via ORAL
  Filled 2017-05-02: qty 1

## 2017-05-02 MED ORDER — SODIUM CHLORIDE 0.9 % IV SOLN
1.5000 g | Freq: Once | INTRAVENOUS | Status: AC
  Administered 2017-05-02: 1.5 g via INTRAVENOUS
  Filled 2017-05-02: qty 1.5

## 2017-05-02 MED ORDER — METRONIDAZOLE 500 MG PO TABS: 500.0000 mg | ORAL_TABLET | Freq: Three times a day (TID) | ORAL | 0 refills | Status: DC

## 2017-05-02 MED ORDER — ACETAMINOPHEN 325 MG PO TABS
ORAL_TABLET | ORAL | Status: AC
  Filled 2017-05-02: qty 2

## 2017-05-02 NOTE — Patient Instructions (Signed)

## 2017-05-02 NOTE — Addendum Note (Signed)
Addended by: Tora Kindred on: 05/02/2017 10:25 AM   Modules accepted: Orders

## 2017-05-02 NOTE — Progress Notes (Signed)
Hematology and Oncology Follow Up Visit  Christian Burns [id number] 04-Sep-1929 83 y.o. 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 Unfortunately, he really looks rough. I'm not sure exactly what has happened to him. He comes in with his daughter. He comes in in a wheelchair.  He is still bothered by this submental lesion. He is being followed closely by ENT. He goes for a MRI next week.  He clearly is dehydrated. I suspect they probably is anemic. He is not eating. He does not have much of an appetite. He has lost weight.   We checked his myeloma studies in August, his M spike was 0.4 g/dL. His IgG level was 580 mg/dL. His   lambda light chain is 4.2 mg/dL  He is not complaining of pain. He's had some bowel incontinence. I wonder if he has some type of colitis because of the antibiotics that he's been taking.  Marland KitchenHe's had no obvious fever. He's had no bleeding. He's had no urinary incontinence.   Overall, his performance status is ECOG 2.  Medications:  Allergies as of 05/01/2017      Reactions   Sulfa Antibiotics Itching   Sulfasalazine Itching      Medication List       Accurate as of 05/01/17 11:59 PM. Always use your most recent med list.          amLODipine-benazepril 5-10 MG capsule Commonly known as:  LOTREL Take 1 capsule by mouth daily.   aspirin EC 81 MG tablet Take 81 mg by mouth daily.   CALCIUM 600 + D PO Take by mouth every morning.   HYDROcodone-acetaminophen 5-325 MG tablet Commonly known as:   NORCO/VICODIN Take 1 tablet by mouth every 6 (six) hours as needed for moderate pain.   LORazepam 0.5 MG tablet Commonly known as:  ATIVAN Take 1 tablet (0.5 mg total) by mouth every 6 (six) hours as needed (Nausea or vomiting).   MULTIVITAL PO Take 1 tablet by mouth every morning.   VITAMIN B 12 PO Take 1,000 mcg by mouth every morning.   vitamin B-6 250 MG tablet Take 500 mg by mouth daily.   Vitamin D (Ergocalciferol) 50000 units Caps capsule Commonly known as:  DRISDOL Take 50,000 Units by mouth every 7 (seven) days. Sundays            Discharge Care Instructions        Start     Ordered   05/01/17 1432  EKG 12-Lead    Comments:  Specimen [id number]: Ordered by an unspecified provider    05/01/17 1432   05/01/17 1430  EKG 12-Lead    Comments:  Specimen [id number]: Ordered by an unspecified provider    05/01/17 1430      Allergies:  Allergies  Allergen Reactions  . Sulfa Antibiotics Itching  . Sulfasalazine Itching    Past Medical History, Surgical history, Social history, and Family History were reviewed and updated.  Review of Systems: Review of Systems  Constitutional: Negative for appetite change, fatigue, fever and unexpected weight  change.  HENT:   Negative for lump/mass, mouth sores, sore throat and trouble swallowing.   Respiratory: Negative for cough, hemoptysis and shortness of breath.   Cardiovascular: Negative for leg swelling and palpitations.  Gastrointestinal: Negative for abdominal distention, abdominal pain, blood in stool, constipation, diarrhea, nausea and vomiting.  Genitourinary: Negative for bladder incontinence, dysuria, frequency and hematuria.   Musculoskeletal: Negative for arthralgias, back pain, gait problem and myalgias.  Skin: Negative for itching and rash.  Neurological: Negative for dizziness, extremity weakness, gait problem, headaches, numbness, seizures and speech difficulty.  Hematological: Does not bruise/bleed easily.   Psychiatric/Behavioral: Negative for depression and sleep disturbance. The patient is not nervous/anxious.      Physical Exam:  weight is 134 lb 6.4 oz (61 kg). His oral temperature is 98.4 F (36.9 C). His blood pressure is 120/59 (abnormal) and his pulse is 114 (abnormal). His respiration is 20 and oxygen saturation is 100%.   Wt Readings from Last 3 Encounters:  05/01/17 134 lb 6.4 oz (61 kg)  04/04/17 140 lb (63.5 kg)  03/21/17 141 lb (64 kg)    Physical Exam  Constitutional: He is oriented to person, place, and time.  HENT:  Head: Normocephalic and atraumatic.  Mouth/Throat: Oropharynx is clear and moist.  Eyes: Pupils are equal, round, and reactive to light. EOM are normal.  Neck: Normal range of motion.  Cardiovascular: Normal rate, regular rhythm and normal heart sounds.   Pulmonary/Chest: Effort normal and breath sounds normal.  Abdominal: Soft. Bowel sounds are normal.  Musculoskeletal: Normal range of motion. He exhibits no edema, tenderness or deformity.  Lymphadenopathy:    He has no cervical adenopathy.  Neurological: He is alert and oriented to person, place, and time.  Skin: Skin is warm and dry. No rash noted. No erythema.  Psychiatric: He has a normal mood and affect. His behavior is normal. Judgment and thought content normal.  Vitals reviewed.   Lab Results  Component Value Date   WBC 31.8 (H) 05/01/2017   HGB 9.8 (L) 05/01/2017   HCT 28.3 (L) 05/01/2017   MCV 91 05/01/2017   PLT 237 05/01/2017   Lab Results  Component Value Date   FERRITIN 131 03/06/2017   IRON 99 03/06/2017   TIBC 256 03/06/2017   UIBC 157 03/06/2017   IRONPCTSAT 39 03/06/2017   Lab Results  Component Value Date   RBC 3.12 (L) 05/01/2017   Lab Results  Component Value Date   KPAFRELGTCHN 3.11 (H) 07/28/2015   LAMBDASER 37.90 (H) 07/28/2015   KAPLAMBRATIO 0.29 05/01/2017   Lab Results  Component Value Date   IGGSERUM 522 (L) 05/01/2017   IGA 204 07/28/2015    IGMSERUM 22 05/01/2017   Lab Results  Component Value Date   TOTALPROTELP 7.9 07/28/2015   ALBUMINELP 3.7 (L) 07/28/2015   A1GS 0.3 07/28/2015   A2GS 0.7 07/28/2015   BETS 0.4 07/28/2015   BETA2SER 0.3 07/28/2015   GAMS 2.5 (H) 07/28/2015   MSPIKE 0.4 (H) 04/04/2017   SPEI * 07/28/2015     Chemistry      Component Value Date/Time   NA 137 05/01/2017 1311   NA 141 01/09/2017 1004   K 3.4 05/01/2017 1311   K 4.4 01/09/2017 1004   CL 106 05/01/2017 1311   CO2 19 05/01/2017 1311   CO2 22 01/09/2017 1004   BUN 46 (H) 05/01/2017 1311   BUN 23.9 01/09/2017 1004   CREATININE 2.5 (H) 05/01/2017 1311   CREATININE 1.5 (H) 01/09/2017  1004      Component Value Date/Time   CALCIUM 7.9 (L) 05/01/2017 1311   CALCIUM 8.8 01/09/2017 1004   ALKPHOS 84 05/01/2017 1311   ALKPHOS 80 01/09/2017 1004   AST 21 05/01/2017 1311   AST 18 01/09/2017 1004   ALT 25 05/01/2017 1311   ALT 12 01/09/2017 1004   BILITOT 1.10 05/01/2017 1311   BILITOT 0.92 01/09/2017 1004      Impression and Plan: Mr. Strollo is an 83 year old white male. He has relapsed myeloma. He had been doing incredibly well with Kyprolis.  He clearly cannot have treatment today.  I'm not sure what is wrong with him.  He is definitely dehydrated. We will go ahead and give him IV fluids.   I suspect that he is going to need blood. I think his hemoglobin is falsely elevated.  I just am amazed as to how he has deteriorated. Again I just cannot imagine this being from myeloma. I don't see anything that looks like heart failure.  This is incredibly complicated. I spent about 40 minutes with he and his daughter. I just was not aware that he had declined like this.  I want to do what I can do keep him out of of the hospital. He really wants to be able to help his wife who is incredibly debilitated.   We will have him come back tomorrow for his blood transfusion.  I will also give him some antibiotics. I suspect with his high white  cell count that there is some underlying infection.   I will call in some Flagyl for him. I worry that he may have some infectious colitis.  Thankfully, his daughter was with him and she has a very good understanding of the situation..   I will plan to see him back in another couple weeks so we can see hopefully an improvement.   [name], MD 9/21/20186:14 PM

## 2017-05-05 LAB — BPAM RBC
BLOOD PRODUCT EXPIRATION DATE: 201810152359
BLOOD PRODUCT EXPIRATION DATE: 201810152359
ISSUE DATE / TIME: 201809210817
ISSUE DATE / TIME: 201809210817
Unit Type and Rh: 5100
Unit Type and Rh: 5100

## 2017-05-05 LAB — TYPE AND SCREEN
ABO/RH(D): O POS
Antibody Screen: NEGATIVE
UNIT DIVISION: 0
Unit division: 0

## 2017-05-06 LAB — PROTEIN ELECTROPHORESIS, SERUM, WITH REFLEX
A/G Ratio: 0.9 (ref 0.7–1.7)
ALPHA 2: 0.9 g/dL (ref 0.4–1.0)
Albumin: 2.5 g/dL — ABNORMAL LOW (ref 2.9–4.4)
Alpha 1: 0.5 g/dL — ABNORMAL HIGH (ref 0.0–0.4)
Beta: 0.7 g/dL (ref 0.7–1.3)
GLOBULIN, TOTAL: 2.7 g/dL (ref 2.2–3.9)
Gamma Globulin: 0.6 g/dL (ref 0.4–1.8)
INTERPRETATION(SEE BELOW): 0
M-SPIKE, %: 0.3 g/dL — AB
TOTAL PROTEIN: 5.2 g/dL — AB (ref 6.0–8.5)

## 2017-05-08 ENCOUNTER — Encounter: Payer: Self-pay | Admitting: Hematology & Oncology

## 2017-05-12 ENCOUNTER — Ambulatory Visit
Admission: RE | Admit: 2017-05-12 | Discharge: 2017-05-12 | Disposition: A | Discharge status: Home / Self Care | Payer: Medicare HMO | Source: Ambulatory Visit | Attending: Otolaryngology | Admitting: Otolaryngology

## 2017-05-12 DIAGNOSIS — M272 Inflammatory conditions of jaws: Secondary | ICD-10-CM

## 2017-05-14 ENCOUNTER — Other Ambulatory Visit: Payer: Medicare HMO

## 2017-05-15 ENCOUNTER — Ambulatory Visit (HOSPITAL_BASED_OUTPATIENT_CLINIC_OR_DEPARTMENT_OTHER): Payer: Medicare HMO

## 2017-05-15 ENCOUNTER — Other Ambulatory Visit (HOSPITAL_BASED_OUTPATIENT_CLINIC_OR_DEPARTMENT_OTHER): Payer: Medicare HMO

## 2017-05-15 ENCOUNTER — Ambulatory Visit (HOSPITAL_BASED_OUTPATIENT_CLINIC_OR_DEPARTMENT_OTHER): Payer: Medicare HMO | Admitting: Hematology & Oncology

## 2017-05-15 VITALS — BP 136/71 | HR 72 | Temp 98.1°F | Resp 19 | Wt 137.0 lb

## 2017-05-15 DIAGNOSIS — C9000 Multiple myeloma not having achieved remission: Secondary | ICD-10-CM

## 2017-05-15 DIAGNOSIS — C9002 Multiple myeloma in relapse: Secondary | ICD-10-CM

## 2017-05-15 DIAGNOSIS — Z5112 Encounter for antineoplastic immunotherapy: Secondary | ICD-10-CM

## 2017-05-15 DIAGNOSIS — D508 Other iron deficiency anemias: Secondary | ICD-10-CM

## 2017-05-15 LAB — CBC WITH DIFFERENTIAL (CANCER CENTER ONLY)
BASO#: 0.1 10*3/uL (ref 0.0–0.2)
BASO%: 0.8 % (ref 0.0–2.0)
EOS ABS: 0.1 10*3/uL (ref 0.0–0.5)
EOS%: 1.3 % (ref 0.0–7.0)
HEMATOCRIT: 33.2 % — AB (ref 38.7–49.9)
HEMOGLOBIN: 11.2 g/dL — AB (ref 13.0–17.1)
LYMPH#: 0.5 10*3/uL — ABNORMAL LOW (ref 0.9–3.3)
LYMPH%: 8.9 % — ABNORMAL LOW (ref 14.0–48.0)
MCH: 31.1 pg (ref 28.0–33.4)
MCHC: 33.7 g/dL (ref 32.0–35.9)
MCV: 92 fL (ref 82–98)
MONO#: 0.6 10*3/uL (ref 0.1–0.9)
MONO%: 9.9 % (ref 0.0–13.0)
NEUT%: 79.1 % (ref 40.0–80.0)
NEUTROS ABS: 4.7 10*3/uL (ref 1.5–6.5)
Platelets: 425 10*3/uL — ABNORMAL HIGH (ref 145–400)
RBC: 3.6 10*6/uL — ABNORMAL LOW (ref 4.20–5.70)
RDW: 14 % (ref 11.1–15.7)
WBC: 5.9 10*3/uL (ref 4.0–10.0)

## 2017-05-15 LAB — CMP (CANCER CENTER ONLY)
ALBUMIN: 2.5 g/dL — AB (ref 3.3–5.5)
ALK PHOS: 62 U/L (ref 26–84)
ALT(SGPT): 25 U/L (ref 10–47)
AST: 28 U/L (ref 11–38)
BILIRUBIN TOTAL: 0.6 mg/dL (ref 0.20–1.60)
BUN, Bld: 20 mg/dL (ref 7–22)
CALCIUM: 8.7 mg/dL (ref 8.0–10.3)
CHLORIDE: 111 meq/L — AB (ref 98–108)
CO2: 23 mEq/L (ref 18–33)
CREATININE: 1.3 mg/dL — AB (ref 0.6–1.2)
Glucose, Bld: 87 mg/dL (ref 73–118)
POTASSIUM: 4.1 meq/L (ref 3.3–4.7)
Sodium: 141 mEq/L (ref 128–145)
Total Protein: 5.2 g/dL — ABNORMAL LOW (ref 6.4–8.1)

## 2017-05-15 LAB — IRON AND TIBC
%SAT: 61 % — ABNORMAL HIGH (ref 20–55)
IRON: 106 ug/dL (ref 42–163)
TIBC: 174 ug/dL — ABNORMAL LOW (ref 202–409)
UIBC: 68 ug/dL — AB (ref 117–376)

## 2017-05-15 LAB — TSH: TSH: 0.677 m(IU)/L (ref 0.320–4.118)

## 2017-05-15 LAB — FERRITIN: FERRITIN: 311 ng/mL (ref 22–316)

## 2017-05-15 MED ORDER — DEXAMETHASONE SODIUM PHOSPHATE 10 MG/ML IJ SOLN
10.0000 mg | Freq: Once | INTRAMUSCULAR | Status: AC
  Administered 2017-05-15: 10 mg via INTRAVENOUS

## 2017-05-15 MED ORDER — SODIUM CHLORIDE 0.9 % IV SOLN
Freq: Once | INTRAVENOUS | Status: AC
  Administered 2017-05-15: 14:00:00 via INTRAVENOUS

## 2017-05-15 MED ORDER — SODIUM CHLORIDE 0.9 % IV SOLN: Freq: Once | INTRAVENOUS | Status: DC

## 2017-05-15 MED ORDER — DEXTROSE 5 % IV SOLN
60.0000 mg | Freq: Once | INTRAVENOUS | Status: AC
  Administered 2017-05-15: 60 mg via INTRAVENOUS
  Filled 2017-05-15: qty 30

## 2017-05-15 MED ORDER — DEXAMETHASONE SODIUM PHOSPHATE 10 MG/ML IJ SOLN
INTRAMUSCULAR | Status: AC
  Filled 2017-05-15: qty 1

## 2017-05-15 NOTE — Progress Notes (Signed)
Hematology and Oncology Follow Up Visit  Christian Burns [id number] 12/27/1929 83 y.o. 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 Thankfully, he is doing much better. He feels a lot better. We gave him some antibiotics and blood the last time he was here. His white cell count was up to 31,000. He obviously had an infection that was brewing.  He had a MRI that was done recently for his jaw. This was done on October 1. The MRI basically was negative. There is no evidence of obvious infection. The bone looked okay without any lytic lesions.  Again, he looks a lot better. Is hard to say what was the reason for his improvement.  His last M spike was 0.3 g/dL. This was just a couple weeks ago. His IgG level was 522 mg/dL. His lambda light chain was 8.1 mg/dL which is a little on the higher side.  He is not having urinary incontinence.  He is eating a little bit better. His weight I think is up a pound.  He is able to help his wife again. She is crippled with arthritis.  Currently, his performance status is ECOG 2.    Medications:  Allergies as of 05/15/2017      Reactions   Sulfa Antibiotics Itching   Sulfasalazine Itching      Medication List       Accurate as of 05/15/17  6:20 PM. Always use your most recent med list.          amLODipine-benazepril 5-10 MG capsule Commonly known as:  LOTREL Take 1 capsule by mouth daily.   aspirin EC 81 MG tablet Take 81 mg by mouth daily.   CALCIUM 600 + D PO Take by mouth every morning.   HYDROcodone-acetaminophen 5-325 MG tablet Commonly known as:  NORCO/VICODIN Take 1 tablet by mouth every 6 (six) hours as needed for moderate pain.   LORazepam 0.5 MG tablet Commonly known as:  ATIVAN Take 1 tablet (0.5 mg total) by mouth every 6 (six) hours as needed (Nausea or vomiting).   metroNIDAZOLE 500 MG tablet Commonly known as:  FLAGYL Take 1 tablet (500 mg total) by mouth 3 (three) times daily. Take for ten days   MULTIVITAL PO Take 1 tablet by mouth every morning.   VITAMIN B 12 PO Take 1,000 mcg by mouth every morning.   vitamin B-6 250 MG tablet Take 500 mg by mouth daily.   Vitamin D (Ergocalciferol) 50000 units Caps capsule Commonly known as:  DRISDOL Take 50,000 Units by mouth every 7 (seven) days. Sundays       Allergies:  Allergies  Allergen Reactions  . Sulfa Antibiotics Itching  . Sulfasalazine Itching    Past Medical History, Surgical history, Social history, and Family History were reviewed and updated.  Review of Systems: Review of Systems  Constitutional: Negative for appetite change, fatigue, fever and unexpected weight change.  HENT:   Negative for lump/mass, mouth sores, sore throat and trouble swallowing.   Respiratory: Negative for cough, hemoptysis and shortness of breath.  Cardiovascular: Negative for leg swelling and palpitations.  Gastrointestinal: Negative for abdominal distention, abdominal pain, blood in stool, constipation, diarrhea, nausea and vomiting.  Genitourinary: Negative for bladder incontinence, dysuria, frequency and hematuria.   Musculoskeletal: Negative for arthralgias, back pain, gait problem and myalgias.  Skin: Negative for itching and rash.  Neurological: Negative for dizziness, extremity weakness, gait problem, headaches, numbness, seizures and speech difficulty.  Hematological: Does not bruise/bleed easily.  Psychiatric/Behavioral: Negative for depression and sleep disturbance. The patient is not  nervous/anxious.      Physical Exam:  weight is 137 lb (62.1 kg). His oral temperature is 98.1 F (36.7 C). His blood pressure is 136/71 and his pulse is 72. His respiration is 19 and oxygen saturation is 100%.   Wt Readings from Last 3 Encounters:  05/15/17 137 lb (62.1 kg)  05/01/17 134 lb 6.4 oz (61 kg)  04/04/17 140 lb (63.5 kg)    Physical Exam  Constitutional: He is oriented to person, place, and time.  HENT:  Head: Normocephalic and atraumatic.  Mouth/Throat: Oropharynx is clear and moist.  Eyes: Pupils are equal, round, and reactive to light. EOM are normal.  Neck: Normal range of motion.  Cardiovascular: Normal rate, regular rhythm and normal heart sounds.   Pulmonary/Chest: Effort normal and breath sounds normal.  Abdominal: Soft. Bowel sounds are normal.  Musculoskeletal: Normal range of motion. He exhibits no edema, tenderness or deformity.  Lymphadenopathy:    He has no cervical adenopathy.  Neurological: He is alert and oriented to person, place, and time.  Skin: Skin is warm and dry. No rash noted. No erythema.  Psychiatric: He has a normal mood and affect. His behavior is normal. Judgment and thought content normal.  Vitals reviewed.   Lab Results  Component Value Date   WBC 5.9 05/15/2017   HGB 11.2 (L) 05/15/2017   HCT 33.2 (L) 05/15/2017   MCV 92 05/15/2017   PLT 425 (H) 05/15/2017   Lab Results  Component Value Date   FERRITIN 311 05/15/2017   IRON 106 05/15/2017   TIBC 174 (L) 05/15/2017   UIBC 68 (L) 05/15/2017   IRONPCTSAT 61 (H) 05/15/2017   Lab Results  Component Value Date   RBC 3.60 (L) 05/15/2017   Lab Results  Component Value Date   KPAFRELGTCHN 3.11 (H) 07/28/2015   LAMBDASER 37.90 (H) 07/28/2015   KAPLAMBRATIO 0.29 05/01/2017   Lab Results  Component Value Date   IGGSERUM 522 (L) 05/01/2017   IGA 204 07/28/2015   IGMSERUM 22 05/01/2017   Lab Results  Component Value Date   TOTALPROTELP 7.9 07/28/2015   ALBUMINELP 3.7  (L) 07/28/2015   A1GS 0.3 07/28/2015   A2GS 0.7 07/28/2015   BETS 0.4 07/28/2015   BETA2SER 0.3 07/28/2015   GAMS 2.5 (H) 07/28/2015   MSPIKE 0.3 (H) 05/01/2017   SPEI * 07/28/2015     Chemistry      Component Value Date/Time   NA 141 05/15/2017 1146   NA 141 01/09/2017 1004   K 4.1 05/15/2017 1146   K 4.4 01/09/2017 1004   CL 111 (H) 05/15/2017 1146   CO2 23 05/15/2017 1146   CO2 22 01/09/2017 1004   BUN 20 05/15/2017 1146   BUN 23.9 01/09/2017 1004   CREATININE 1.3 (H) 05/15/2017 1146   CREATININE 1.5 (H) 01/09/2017 1004      Component Value Date/Time   CALCIUM 8.7 05/15/2017 1146   CALCIUM 8.8 01/09/2017 1004   ALKPHOS 62 05/15/2017 1146  ALKPHOS 80 01/09/2017 1004   AST 28 05/15/2017 1146   AST 18 01/09/2017 1004   ALT 25 05/15/2017 1146   ALT 12 01/09/2017 1004   BILITOT 0.60 05/15/2017 1146   BILITOT 0.92 01/09/2017 1004      Impression and Plan: Mr. Boateng is an 83 year old white male. He has relapsed myeloma. He had been doing incredibly well with Kyprolis.  We can now get him back on to therapy. He is doing better. Clearly, therapy has been working. His myeloma levels are nice and low.  We will have to still be cautious with him.  We will be able to treat him today with Kyprolis.  He will come back in 2 weeks. We will see how he is doing. We will check his blood counts.   [name], MD 10/4/20186:20 PM

## 2017-05-15 NOTE — Patient Instructions (Signed)
[facility] Discharge Instructions for Patients Receiving Chemotherapy  Today you received the following chemotherapy agents:  Kyprolis  To help prevent nausea and vomiting after your treatment, we encourage you to take your nausea medication as ordered per MD.    If you develop nausea and vomiting that is not controlled by your nausea medication, call the clinic.   BELOW ARE SYMPTOMS THAT SHOULD BE REPORTED IMMEDIATELY:  *FEVER GREATER THAN 100.5 F  *CHILLS WITH OR WITHOUT FEVER  NAUSEA AND VOMITING THAT IS NOT CONTROLLED WITH YOUR NAUSEA MEDICATION  *UNUSUAL SHORTNESS OF BREATH  *UNUSUAL BRUISING OR BLEEDING  TENDERNESS IN MOUTH AND THROAT WITH OR WITHOUT PRESENCE OF ULCERS  *URINARY PROBLEMS  *BOWEL PROBLEMS  UNUSUAL RASH Items with * indicate a potential emergency and should be followed up as soon as possible.  Feel free to call the clinic should you have any questions or concerns. The clinic phone number is [phone].  Please show the CHEMO ALERT CARD at check-in to the Emergency Department and triage nurse.   

## 2017-05-16 LAB — IGG, IGA, IGM
IGA/IMMUNOGLOBULIN A, SERUM: 105 mg/dL (ref 61–437)
IGG (IMMUNOGLOBIN G), SERUM: 699 mg/dL — AB (ref 700–1600)
IGM (IMMUNOGLOBIN M), SRM: 25 mg/dL (ref 15–143)

## 2017-05-16 LAB — KAPPA/LAMBDA LIGHT CHAINS
Ig Kappa Free Light Chain: 22.8 mg/L — ABNORMAL HIGH (ref 3.3–19.4)
Ig Lambda Free Light Chain: 102.2 mg/L — ABNORMAL HIGH (ref 5.7–26.3)
Kappa/Lambda FluidC Ratio: 0.22 — ABNORMAL LOW (ref 0.26–1.65)

## 2017-05-20 LAB — PROTEIN ELECTROPHORESIS, SERUM, WITH REFLEX
A/G RATIO SPE: 1.1 (ref 0.7–1.7)
ALPHA 2: 0.7 g/dL (ref 0.4–1.0)
Albumin: 2.5 g/dL — ABNORMAL LOW (ref 2.9–4.4)
Alpha 1: 0.3 g/dL (ref 0.0–0.4)
BETA: 0.6 g/dL — AB (ref 0.7–1.3)
GAMMA GLOBULIN: 0.7 g/dL (ref 0.4–1.8)
GLOBULIN, TOTAL: 2.3 g/dL (ref 2.2–3.9)
Interpretation(See Below): 0
M-SPIKE, %: 0.4 g/dL — AB
TOTAL PROTEIN: 4.8 g/dL — AB (ref 6.0–8.5)

## 2017-05-25 ENCOUNTER — Other Ambulatory Visit: Payer: Self-pay | Admitting: Hematology & Oncology

## 2017-05-25 DIAGNOSIS — I10 Essential (primary) hypertension: Secondary | ICD-10-CM

## 2017-05-29 ENCOUNTER — Ambulatory Visit (HOSPITAL_BASED_OUTPATIENT_CLINIC_OR_DEPARTMENT_OTHER): Payer: Medicare HMO

## 2017-05-29 ENCOUNTER — Other Ambulatory Visit (HOSPITAL_BASED_OUTPATIENT_CLINIC_OR_DEPARTMENT_OTHER): Payer: Medicare HMO

## 2017-05-29 ENCOUNTER — Ambulatory Visit (HOSPITAL_BASED_OUTPATIENT_CLINIC_OR_DEPARTMENT_OTHER): Payer: Medicare HMO | Admitting: Hematology & Oncology

## 2017-05-29 VITALS — BP 151/77 | HR 71 | Temp 98.3°F | Resp 18 | Wt 141.0 lb

## 2017-05-29 DIAGNOSIS — C9002 Multiple myeloma in relapse: Secondary | ICD-10-CM

## 2017-05-29 DIAGNOSIS — D649 Anemia, unspecified: Secondary | ICD-10-CM

## 2017-05-29 DIAGNOSIS — B029 Zoster without complications: Secondary | ICD-10-CM

## 2017-05-29 DIAGNOSIS — C9000 Multiple myeloma not having achieved remission: Secondary | ICD-10-CM

## 2017-05-29 DIAGNOSIS — Z23 Encounter for immunization: Secondary | ICD-10-CM

## 2017-05-29 DIAGNOSIS — C9001 Multiple myeloma in remission: Secondary | ICD-10-CM

## 2017-05-29 DIAGNOSIS — Z5112 Encounter for antineoplastic immunotherapy: Secondary | ICD-10-CM | POA: Diagnosis not present

## 2017-05-29 DIAGNOSIS — Z923 Personal history of irradiation: Secondary | ICD-10-CM

## 2017-05-29 LAB — CMP (CANCER CENTER ONLY)
ALBUMIN: 2.5 g/dL — AB (ref 3.3–5.5)
ALT(SGPT): 24 U/L (ref 10–47)
AST: 25 U/L (ref 11–38)
Alkaline Phosphatase: 70 U/L (ref 26–84)
BUN: 16 mg/dL (ref 7–22)
CHLORIDE: 111 meq/L — AB (ref 98–108)
CO2: 28 meq/L (ref 18–33)
CREATININE: 1.1 mg/dL (ref 0.6–1.2)
Calcium: 9.1 mg/dL (ref 8.0–10.3)
GLUCOSE: 101 mg/dL (ref 73–118)
Potassium: 4.2 mEq/L (ref 3.3–4.7)
SODIUM: 146 meq/L — AB (ref 128–145)
TOTAL PROTEIN: 5.9 g/dL — AB (ref 6.4–8.1)
Total Bilirubin: 0.6 mg/dl (ref 0.20–1.60)

## 2017-05-29 LAB — CBC WITH DIFFERENTIAL (CANCER CENTER ONLY)
BASO#: 0 10*3/uL (ref 0.0–0.2)
BASO%: 0.5 % (ref 0.0–2.0)
EOS ABS: 0.3 10*3/uL (ref 0.0–0.5)
EOS%: 4.6 % (ref 0.0–7.0)
HEMATOCRIT: 29 % — AB (ref 38.7–49.9)
HEMOGLOBIN: 9.8 g/dL — AB (ref 13.0–17.1)
LYMPH#: 0.6 10*3/uL — AB (ref 0.9–3.3)
LYMPH%: 9.2 % — ABNORMAL LOW (ref 14.0–48.0)
MCH: 31.4 pg (ref 28.0–33.4)
MCHC: 33.8 g/dL (ref 32.0–35.9)
MCV: 93 fL (ref 82–98)
MONO#: 0.8 10*3/uL (ref 0.1–0.9)
MONO%: 11.9 % (ref 0.0–13.0)
NEUT%: 73.8 % (ref 40.0–80.0)
NEUTROS ABS: 4.6 10*3/uL (ref 1.5–6.5)
Platelets: 254 10*3/uL (ref 145–400)
RBC: 3.12 10*6/uL — AB (ref 4.20–5.70)
RDW: 14.1 % (ref 11.1–15.7)
WBC: 6.3 10*3/uL (ref 4.0–10.0)

## 2017-05-29 MED ORDER — DEXAMETHASONE SODIUM PHOSPHATE 10 MG/ML IJ SOLN
10.0000 mg | Freq: Once | INTRAMUSCULAR | Status: AC
  Administered 2017-05-29: 10 mg via INTRAVENOUS

## 2017-05-29 MED ORDER — INFLUENZA VAC SPLIT QUAD 0.5 ML IM SUSY
PREFILLED_SYRINGE | INTRAMUSCULAR | Status: AC
  Filled 2017-05-29: qty 0.5

## 2017-05-29 MED ORDER — INFLUENZA VAC SPLIT QUAD 0.5 ML IM SUSY
0.5000 mL | PREFILLED_SYRINGE | Freq: Once | INTRAMUSCULAR | Status: AC
  Administered 2017-05-29: 0.5 mL via INTRAMUSCULAR

## 2017-05-29 MED ORDER — DEXTROSE 5 % IV SOLN
60.0000 mg | Freq: Once | INTRAVENOUS | Status: AC
  Administered 2017-05-29: 60 mg via INTRAVENOUS
  Filled 2017-05-29: qty 30

## 2017-05-29 MED ORDER — DEXAMETHASONE SODIUM PHOSPHATE 10 MG/ML IJ SOLN
INTRAMUSCULAR | Status: AC
  Filled 2017-05-29: qty 1

## 2017-05-29 MED ORDER — SODIUM CHLORIDE 0.9 % IV SOLN: Freq: Once | INTRAVENOUS | Status: DC

## 2017-05-29 MED ORDER — ACYCLOVIR 5 % EX OINT
1.0000 | TOPICAL_OINTMENT | CUTANEOUS | 2 refills | Status: DC
Start: 2017-05-29 — End: 2017-06-12

## 2017-05-29 MED ORDER — PALONOSETRON HCL INJECTION 0.25 MG/5ML
INTRAVENOUS | Status: AC
  Filled 2017-05-29: qty 5

## 2017-05-29 MED ORDER — PALONOSETRON HCL INJECTION 0.25 MG/5ML
0.2500 mg | Freq: Once | INTRAVENOUS | Status: AC
  Administered 2017-05-29: 0.25 mg via INTRAVENOUS

## 2017-05-29 MED ORDER — SODIUM CHLORIDE 0.9 % IV SOLN
300.0000 mg/m2 | Freq: Once | INTRAVENOUS | Status: AC
  Administered 2017-05-29: 540 mg via INTRAVENOUS
  Filled 2017-05-29: qty 27

## 2017-05-29 MED ORDER — SODIUM CHLORIDE 0.9 % IV SOLN
Freq: Once | INTRAVENOUS | Status: AC
  Administered 2017-05-29: 14:00:00 via INTRAVENOUS

## 2017-05-29 NOTE — Patient Instructions (Signed)
[facility] Discharge Instructions for Patients Receiving Chemotherapy  Today you received the following chemotherapy agents:  Kyprolis and Cytoxan  To help prevent nausea and vomiting after your treatment, we encourage you to take your nausea medication as ordered per MD.    If you develop nausea and vomiting that is not controlled by your nausea medication, call the clinic.   BELOW ARE SYMPTOMS THAT SHOULD BE REPORTED IMMEDIATELY:  *FEVER GREATER THAN 100.5 F  *CHILLS WITH OR WITHOUT FEVER  NAUSEA AND VOMITING THAT IS NOT CONTROLLED WITH YOUR NAUSEA MEDICATION  *UNUSUAL SHORTNESS OF BREATH  *UNUSUAL BRUISING OR BLEEDING  TENDERNESS IN MOUTH AND THROAT WITH OR WITHOUT PRESENCE OF ULCERS  *URINARY PROBLEMS  *BOWEL PROBLEMS  UNUSUAL RASH Items with * indicate a potential emergency and should be followed up as soon as possible.  Feel free to call the clinic should you have any questions or concerns. The clinic phone number is [phone].  Please show the CHEMO ALERT CARD at check-in to the Emergency Department and triage nurse.   

## 2017-05-29 NOTE — Progress Notes (Signed)
Hematology and Oncology Follow Up Visit  Christian Burns [id number] 07-22-1930 83 y.o. 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 Bunt is back for follow-up. Unfortunately, it looks like the myeloma is becoming more active. We last saw him a couple weeks ago, his myeloma studies showed his lambda light chain to be up to 10.2 mg/dL. Prior, it was 8.1 mg/dL. His M spike was 0.4 g/dL. His IgG level was 700 mg/dL.  There is now some tenderness under his chin. He has a little bit of swelling and firmness. He has had this on and off for all summer. He is seeing ENT about this. I just wonder if this area might not be localized zoster. I went ahead and called in some acyclovir cream.  There's been no fever.  Of note, his erythropoietin level is only 15. As such, we can use Aranesp if necessary to try to get his hemoglobin up if he becomes symptomatic.  His appetite is doing better. He feels like he can eat a little bit more.  He's had no problems with pain. His right hip is doing okay. He still has a numbness in his feet.  Thankfully, his wife seems to be doing a little bit better.  Overall, his performance status is ECOG 2.    Medications:  Allergies as of 05/29/2017      Reactions   Sulfa Antibiotics Itching   Sulfasalazine Itching      Medication List       Accurate as of 05/29/17  1:21 PM. Always use your most recent med list.          amLODipine-benazepril 5-10 MG capsule Commonly known as:  LOTREL Take 1 capsule by mouth daily.     amLODipine-benazepril 5-10 MG capsule Commonly known as:  LOTREL TAKE 1 CAPSULE BY MOUTH DAILY   aspirin EC 81 MG tablet Take 81 mg by mouth daily.   CALCIUM 600 + D PO Take by mouth every morning.   HYDROcodone-acetaminophen 5-325 MG tablet Commonly known as:  NORCO/VICODIN Take 1 tablet by mouth every 6 (six) hours as needed for moderate pain.   LORazepam 0.5 MG tablet Commonly known as:  ATIVAN Take 1 tablet (0.5 mg total) by mouth every 6 (six) hours as needed (Nausea or vomiting).   metroNIDAZOLE 500 MG tablet Commonly known as:  FLAGYL Take 1 tablet (500 mg total) by mouth 3 (three) times daily. Take for ten days   MULTIVITAL PO Take 1 tablet by mouth every morning.   VITAMIN B 12 PO Take 1,000 mcg by mouth every morning.   vitamin B-6 250 MG tablet Take 500 mg by mouth daily.   Vitamin D (Ergocalciferol) 50000 units Caps capsule Commonly known as:  DRISDOL Take 50,000 Units by mouth every 7 (seven) days. Sundays       Allergies:  Allergies  Allergen Reactions  . Sulfa Antibiotics Itching  . Sulfasalazine Itching    Past Medical History, Surgical history, Social history, and Family History were reviewed and updated.  Review of Systems: Review of Systems  Constitutional: Negative for appetite change, fatigue, fever and unexpected weight change.  HENT:   Negative for lump/mass, mouth sores, sore throat and trouble swallowing.   Respiratory: Negative for cough, hemoptysis and shortness of breath.   Cardiovascular: Negative for leg swelling and palpitations.  Gastrointestinal: Negative for abdominal distention, abdominal pain, blood in stool, constipation, diarrhea, nausea and vomiting.  Genitourinary: Negative for bladder incontinence, dysuria, frequency and hematuria.   Musculoskeletal: Negative for arthralgias, back pain, gait problem and myalgias.  Skin: Negative for itching and rash.  Neurological: Negative for dizziness, extremity weakness, gait  problem, headaches, numbness, seizures and speech difficulty.  Hematological: Does not bruise/bleed easily.  Psychiatric/Behavioral: Negative for depression and sleep disturbance. The patient is not nervous/anxious.      Physical Exam:  vitals were not taken for this visit.  Wt Readings from Last 3 Encounters:  05/15/17 137 lb (62.1 kg)  05/01/17 134 lb 6.4 oz (61 kg)  04/04/17 140 lb (63.5 kg)    Physical Exam  Constitutional: He is oriented to person, place, and time.  HENT:  Head: Normocephalic and atraumatic.  Mouth/Throat: Oropharynx is clear and moist.  Eyes: Pupils are equal, round, and reactive to light. EOM are normal.  Neck: Normal range of motion.  Cardiovascular: Normal rate, regular rhythm and normal heart sounds.   Pulmonary/Chest: Effort normal and breath sounds normal.  Abdominal: Soft. Bowel sounds are normal.  Musculoskeletal: Normal range of motion. He exhibits no edema, tenderness or deformity.  Lymphadenopathy:    He has no cervical adenopathy.  Neurological: He is alert and oriented to person, place, and time.  Skin: Skin is warm and dry. No rash noted. No erythema.  Psychiatric: He has a normal mood and affect. His behavior is normal. Judgment and thought content normal.  Vitals reviewed.   Lab Results  Component Value Date   WBC 6.3 05/29/2017   HGB 9.8 (L) 05/29/2017   HCT 29.0 (L) 05/29/2017   MCV 93 05/29/2017   PLT 254 05/29/2017   Lab Results  Component Value Date   FERRITIN 311 05/15/2017   IRON 106 05/15/2017   TIBC 174 (L) 05/15/2017   UIBC 68 (L) 05/15/2017   IRONPCTSAT 61 (H) 05/15/2017   Lab Results  Component Value Date   RBC 3.12 (L) 05/29/2017   Lab Results  Component Value Date   KPAFRELGTCHN 3.11 (H) 07/28/2015   LAMBDASER 37.90 (H) 07/28/2015   KAPLAMBRATIO 0.22 (L) 05/15/2017   Lab Results  Component Value Date   IGGSERUM 699 (L) 05/15/2017   IGA 204 07/28/2015   IGMSERUM 25 05/15/2017   Lab Results    Component Value Date   TOTALPROTELP 7.9 07/28/2015   ALBUMINELP 3.7 (L) 07/28/2015   A1GS 0.3 07/28/2015   A2GS 0.7 07/28/2015   BETS 0.4 07/28/2015   BETA2SER 0.3 07/28/2015   GAMS 2.5 (H) 07/28/2015   MSPIKE 0.4 (H) 05/15/2017   SPEI * 07/28/2015     Chemistry      Component Value Date/Time   NA 141 05/15/2017 1146   NA 141 01/09/2017 1004   K 4.1 05/15/2017 1146   K 4.4 01/09/2017 1004   CL 111 (H) 05/15/2017 1146   CO2 23 05/15/2017 1146   CO2 22 01/09/2017 1004   BUN 20 05/15/2017 1146   BUN 23.9 01/09/2017 1004   CREATININE 1.3 (H) 05/15/2017 1146   CREATININE 1.5 (H) 01/09/2017 1004      Component Value Date/Time   CALCIUM 8.7 05/15/2017 1146  CALCIUM 8.8 01/09/2017 1004   ALKPHOS 62 05/15/2017 1146   ALKPHOS 80 01/09/2017 1004   AST 28 05/15/2017 1146   AST 18 01/09/2017 1004   ALT 25 05/15/2017 1146   ALT 12 01/09/2017 1004   BILITOT 0.60 05/15/2017 1146   BILITOT 0.92 01/09/2017 1004      Impression and Plan: Mr. Rio is an 83 year old white male. He has relapsed myeloma. He had been doing incredibly well with Kyprolis. However, I really think we're going to have to add back the Cytoxan. I think this would be easiest for him instead of switching to Darzalex.  I talked to him for about 30 minutes. I reviewed his labs. I explained why I thought we had to add back Cytoxan. He does understand.  We will plan to get him back in 2 more weeks.  Again, we can use Aranesp if necessary depending on his hemoglobin level and depending on whether or not he is symptomatic from anemia.  [name], MD 10/18/20181:21 PM

## 2017-05-30 LAB — IGG, IGA, IGM
IGA/IMMUNOGLOBULIN A, SERUM: 93 mg/dL (ref 61–437)
IgG, Qn, Serum: 654 mg/dL — ABNORMAL LOW (ref 700–1600)
IgM, Qn, Serum: 28 mg/dL (ref 15–143)

## 2017-05-30 LAB — KAPPA/LAMBDA LIGHT CHAINS
IG LAMBDA FREE LIGHT CHAIN: 68 mg/L — AB (ref 5.7–26.3)
Ig Kappa Free Light Chain: 19.7 mg/L — ABNORMAL HIGH (ref 3.3–19.4)
KAPPA/LAMBDA FLC RATIO: 0.29 (ref 0.26–1.65)

## 2017-06-03 LAB — PROTEIN ELECTROPHORESIS, SERUM, WITH REFLEX
A/G RATIO SPE: 1 (ref 0.7–1.7)
Albumin: 2.5 g/dL — ABNORMAL LOW (ref 2.9–4.4)
Alpha 1: 0.3 g/dL (ref 0.0–0.4)
Alpha 2: 0.8 g/dL (ref 0.4–1.0)
Beta: 0.8 g/dL (ref 0.7–1.3)
GAMMA GLOBULIN: 0.6 g/dL (ref 0.4–1.8)
Globulin, Total: 2.6 g/dL (ref 2.2–3.9)
Interpretation(See Below): 0
M-SPIKE, %: 0.3 g/dL — AB
TOTAL PROTEIN: 5.1 g/dL — AB (ref 6.0–8.5)

## 2017-06-10 DIAGNOSIS — E7849 Other hyperlipidemia: Secondary | ICD-10-CM | POA: Diagnosis not present

## 2017-06-10 DIAGNOSIS — M859 Disorder of bone density and structure, unspecified: Secondary | ICD-10-CM | POA: Diagnosis not present

## 2017-06-10 DIAGNOSIS — R82998 Other abnormal findings in urine: Secondary | ICD-10-CM | POA: Diagnosis not present

## 2017-06-10 DIAGNOSIS — Z125 Encounter for screening for malignant neoplasm of prostate: Secondary | ICD-10-CM | POA: Diagnosis not present

## 2017-06-12 ENCOUNTER — Ambulatory Visit (HOSPITAL_BASED_OUTPATIENT_CLINIC_OR_DEPARTMENT_OTHER): Payer: Medicare HMO

## 2017-06-12 ENCOUNTER — Other Ambulatory Visit (HOSPITAL_BASED_OUTPATIENT_CLINIC_OR_DEPARTMENT_OTHER): Payer: Medicare HMO

## 2017-06-12 ENCOUNTER — Ambulatory Visit (HOSPITAL_BASED_OUTPATIENT_CLINIC_OR_DEPARTMENT_OTHER): Payer: Medicare HMO | Admitting: Hematology & Oncology

## 2017-06-12 VITALS — BP 151/77 | HR 76 | Temp 98.0°F | Resp 19 | Wt 140.0 lb

## 2017-06-12 DIAGNOSIS — C9002 Multiple myeloma in relapse: Secondary | ICD-10-CM

## 2017-06-12 DIAGNOSIS — D631 Anemia in chronic kidney disease: Secondary | ICD-10-CM | POA: Diagnosis not present

## 2017-06-12 DIAGNOSIS — Z5112 Encounter for antineoplastic immunotherapy: Secondary | ICD-10-CM

## 2017-06-12 DIAGNOSIS — N189 Chronic kidney disease, unspecified: Secondary | ICD-10-CM | POA: Diagnosis not present

## 2017-06-12 DIAGNOSIS — D508 Other iron deficiency anemias: Secondary | ICD-10-CM

## 2017-06-12 DIAGNOSIS — Z5111 Encounter for antineoplastic chemotherapy: Secondary | ICD-10-CM | POA: Diagnosis not present

## 2017-06-12 DIAGNOSIS — C9000 Multiple myeloma not having achieved remission: Secondary | ICD-10-CM

## 2017-06-12 DIAGNOSIS — C9001 Multiple myeloma in remission: Secondary | ICD-10-CM

## 2017-06-12 DIAGNOSIS — B029 Zoster without complications: Secondary | ICD-10-CM

## 2017-06-12 LAB — CBC WITH DIFFERENTIAL (CANCER CENTER ONLY)
BASO#: 0.1 10*3/uL (ref 0.0–0.2)
BASO%: 0.7 % (ref 0.0–2.0)
EOS ABS: 0.2 10*3/uL (ref 0.0–0.5)
EOS%: 2.6 % (ref 0.0–7.0)
HEMATOCRIT: 28.4 % — AB (ref 38.7–49.9)
HGB: 9.6 g/dL — ABNORMAL LOW (ref 13.0–17.1)
LYMPH#: 0.5 10*3/uL — AB (ref 0.9–3.3)
LYMPH%: 7.5 % — ABNORMAL LOW (ref 14.0–48.0)
MCH: 31.3 pg (ref 28.0–33.4)
MCHC: 33.8 g/dL (ref 32.0–35.9)
MCV: 93 fL (ref 82–98)
MONO#: 0.8 10*3/uL (ref 0.1–0.9)
MONO%: 11.6 % (ref 0.0–13.0)
NEUT%: 77.6 % (ref 40.0–80.0)
NEUTROS ABS: 5.3 10*3/uL (ref 1.5–6.5)
Platelets: 218 10*3/uL (ref 145–400)
RBC: 3.07 10*6/uL — AB (ref 4.20–5.70)
RDW: 15.1 % (ref 11.1–15.7)
WBC: 6.8 10*3/uL (ref 4.0–10.0)

## 2017-06-12 LAB — CMP (CANCER CENTER ONLY)
ALK PHOS: 81 U/L (ref 26–84)
ALT(SGPT): 22 U/L (ref 10–47)
AST: 23 U/L (ref 11–38)
Albumin: 2.8 g/dL — ABNORMAL LOW (ref 3.3–5.5)
BUN, Bld: 23 mg/dL — ABNORMAL HIGH (ref 7–22)
CALCIUM: 9 mg/dL (ref 8.0–10.3)
CO2: 25 meq/L (ref 18–33)
Chloride: 109 mEq/L — ABNORMAL HIGH (ref 98–108)
Creat: 1.1 mg/dl (ref 0.6–1.2)
Glucose, Bld: 98 mg/dL (ref 73–118)
Potassium: 3.9 mEq/L (ref 3.3–4.7)
SODIUM: 145 meq/L (ref 128–145)
Total Bilirubin: 0.9 mg/dl (ref 0.20–1.60)
Total Protein: 6 g/dL — ABNORMAL LOW (ref 6.4–8.1)

## 2017-06-12 LAB — CHCC SATELLITE - SMEAR

## 2017-06-12 MED ORDER — PALONOSETRON HCL INJECTION 0.25 MG/5ML
INTRAVENOUS | Status: AC
  Filled 2017-06-12: qty 5

## 2017-06-12 MED ORDER — CYCLOPHOSPHAMIDE CHEMO INJECTION 1 GM
300.0000 mg/m2 | Freq: Once | INTRAMUSCULAR | Status: AC
  Administered 2017-06-12: 540 mg via INTRAVENOUS
  Filled 2017-06-12: qty 27

## 2017-06-12 MED ORDER — DEXTROSE 5 % IV SOLN
60.0000 mg | Freq: Once | INTRAVENOUS | Status: AC
  Administered 2017-06-12: 60 mg via INTRAVENOUS
  Filled 2017-06-12: qty 30

## 2017-06-12 MED ORDER — DEXAMETHASONE SODIUM PHOSPHATE 10 MG/ML IJ SOLN
INTRAMUSCULAR | Status: AC
  Filled 2017-06-12: qty 1

## 2017-06-12 MED ORDER — DEXAMETHASONE SODIUM PHOSPHATE 10 MG/ML IJ SOLN
10.0000 mg | Freq: Once | INTRAMUSCULAR | Status: AC
  Administered 2017-06-12: 10 mg via INTRAVENOUS

## 2017-06-12 MED ORDER — SODIUM CHLORIDE 0.9 % IV SOLN
Freq: Once | INTRAVENOUS | Status: AC
  Administered 2017-06-12: 14:00:00 via INTRAVENOUS

## 2017-06-12 MED ORDER — DARBEPOETIN ALFA 300 MCG/0.6ML IJ SOSY
300.0000 ug | PREFILLED_SYRINGE | INTRAMUSCULAR | Status: DC
  Administered 2017-06-12: 300 ug via SUBCUTANEOUS

## 2017-06-12 MED ORDER — PALONOSETRON HCL INJECTION 0.25 MG/5ML
0.2500 mg | Freq: Once | INTRAVENOUS | Status: AC
  Administered 2017-06-12: 0.25 mg via INTRAVENOUS

## 2017-06-12 MED ORDER — DARBEPOETIN ALFA 300 MCG/0.6ML IJ SOSY
PREFILLED_SYRINGE | INTRAMUSCULAR | Status: AC
  Filled 2017-06-12: qty 0.6

## 2017-06-12 NOTE — Progress Notes (Signed)
Hematology and Oncology Follow Up Visit  Christian Burns [id number] 1930/06/17 83 y.o. 06/12/2017   Principle Diagnosis:  Recurrent IgG lambda myeloma - progressive Hypercalcemia of malignancy  Current Therapy:   Kyprolis/Cytoxan - s/p cycle #13 - Cytoxan restarted on cycle #13;    Kyprolis q 2 wk Zometa IV q 4 weeks - on hold Aranesp 300 g subcutaneous as needed for hemoglobin less than 10.   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  He comes in with his daughter-in-law. It is always a lot of fun to talk with her.  He is doing okay. He still is having some problems under his chin. I'm not sure exactly what that is significant for. He is seeing ENT for this. He's had a fairly extensive workup for this with nothing actually found.  We did repeat start the Cytoxan with his Kyprolis. I thought we had to re-add the Cytoxan as his myeloma levels were worsening.  We saw him a couple weeks ago, the myeloma panel showed an M spike of 0.3 g/dL. His IgG level was 654 mg/dL. His lambda light chain was 6.8 mg/dL.   He has had no fever.  Of note, his erythropoietin level is only 15.2. As such, Aranesp would be a very good idea for him with his anemia.  I talked to he and his daughter-in-law about this. I explained what Aranesp was. I went ahead and explain some of the side effects. Overall, I think that the benefits far outweigh the risks for him.  He is still having some loose stools. Sometimes he has a little bit of incontinence. I told him to try some low-dose Imodium. I think we did low-dose Imodium daily, this might help with some of this loose stool issue.  He has had no fever. He has had a better appetite. He has had no issues with his inguinal  hernias.  Overall, I was his performance status is still ECOG 2.     Medications:  Allergies as of 06/12/2017      Reactions   Sulfa Antibiotics Itching   Sulfasalazine Itching      Medication List       Accurate as of 06/12/17  1:40 PM. Always use your most recent med list.          amLODipine-benazepril 5-10 MG capsule Commonly known as:  LOTREL TAKE 1 CAPSULE BY MOUTH DAILY   aspirin EC 81 MG tablet Take 81 mg by mouth daily.   MULTIVITAL PO Take 1 tablet by mouth every morning.   pyridOXINE 100 MG tablet Commonly known as:  VITAMIN B-6 Take 100 mg by mouth daily.   VITAMIN B 12 PO Take 1,000 mcg by mouth every morning.   Vitamin D (Ergocalciferol) 50000 units Caps capsule Commonly known as:  DRISDOL Take 50,000 Units by mouth every 7 (seven) days. Sundays       Allergies:  Allergies  Allergen Reactions  . Sulfa Antibiotics Itching  . Sulfasalazine Itching    Past Medical History, Surgical history, Social history, and Family History were reviewed and updated.  Review of Systems: Review of Systems  Constitutional: Negative for appetite change, fatigue, fever and unexpected weight change.  HENT:   Negative for lump/mass, mouth sores, sore throat and trouble swallowing.  Respiratory: Negative for cough, hemoptysis and shortness of breath.   Cardiovascular: Negative for leg swelling and palpitations.  Gastrointestinal: Negative for abdominal distention, abdominal pain, blood in stool, constipation, diarrhea, nausea and vomiting.  Genitourinary: Negative for bladder incontinence, dysuria, frequency and hematuria.   Musculoskeletal: Negative for arthralgias, back pain, gait problem and myalgias.  Skin: Negative for itching and rash.  Neurological: Negative for dizziness, extremity weakness, gait problem, headaches, numbness, seizures and speech difficulty.  Hematological: Does not bruise/bleed easily.  Psychiatric/Behavioral: Negative for depression and  sleep disturbance. The patient is not nervous/anxious.      Physical Exam:  weight is 140 lb (63.5 kg). His oral temperature is 98 F (36.7 C). His blood pressure is 151/77 (abnormal) and his pulse is 76. His respiration is 19 and oxygen saturation is 99%.   Wt Readings from Last 3 Encounters:  06/12/17 140 lb (63.5 kg)  05/29/17 141 lb (64 kg)  05/15/17 137 lb (62.1 kg)    Physical Exam  Constitutional: He is oriented to person, place, and time.  HENT:  Head: Normocephalic and atraumatic.  Mouth/Throat: Oropharynx is clear and moist.  Eyes: Pupils are equal, round, and reactive to light. EOM are normal.  Neck: Normal range of motion.  Cardiovascular: Normal rate, regular rhythm and normal heart sounds.   Pulmonary/Chest: Effort normal and breath sounds normal.  Abdominal: Soft. Bowel sounds are normal.  Musculoskeletal: Normal range of motion. He exhibits no edema, tenderness or deformity.  Lymphadenopathy:    He has no cervical adenopathy.  Neurological: He is alert and oriented to person, place, and time.  Skin: Skin is warm and dry. No rash noted. No erythema.  Psychiatric: He has a normal mood and affect. His behavior is normal. Judgment and thought content normal.  Vitals reviewed.   Lab Results  Component Value Date   WBC 6.8 06/12/2017   HGB 9.6 (L) 06/12/2017   HCT 28.4 (L) 06/12/2017   MCV 93 06/12/2017   PLT 218 06/12/2017   Lab Results  Component Value Date   FERRITIN 311 05/15/2017   IRON 106 05/15/2017   TIBC 174 (L) 05/15/2017   UIBC 68 (L) 05/15/2017   IRONPCTSAT 61 (H) 05/15/2017   Lab Results  Component Value Date   RBC 3.07 (L) 06/12/2017   Lab Results  Component Value Date   KPAFRELGTCHN 3.11 (H) 07/28/2015   LAMBDASER 37.90 (H) 07/28/2015   KAPLAMBRATIO 0.29 05/29/2017   Lab Results  Component Value Date   IGGSERUM 654 (L) 05/29/2017   IGA 204 07/28/2015   IGMSERUM 28 05/29/2017   Lab Results  Component Value Date   TOTALPROTELP  7.9 07/28/2015   ALBUMINELP 3.7 (L) 07/28/2015   A1GS 0.3 07/28/2015   A2GS 0.7 07/28/2015   BETS 0.4 07/28/2015   BETA2SER 0.3 07/28/2015   GAMS 2.5 (H) 07/28/2015   MSPIKE 0.3 (H) 05/29/2017   SPEI * 07/28/2015     Chemistry      Component Value Date/Time   NA 145 06/12/2017 1310   NA 141 01/09/2017 1004   K 3.9 06/12/2017 1310   K 4.4 01/09/2017 1004   CL 109 (H) 06/12/2017 1310   CO2 25 06/12/2017 1310   CO2 22 01/09/2017 1004   BUN 23 (H) 06/12/2017 1310   BUN 23.9 01/09/2017 1004   CREATININE 1.1 06/12/2017 1310   CREATININE 1.5 (H) 01/09/2017 1004      Component Value Date/Time   CALCIUM 9.0 06/12/2017 1310   CALCIUM 8.8 01/09/2017  1004   ALKPHOS 81 06/12/2017 1310   ALKPHOS 80 01/09/2017 1004   AST 23 06/12/2017 1310   AST 18 01/09/2017 1004   ALT 22 06/12/2017 1310   ALT 12 01/09/2017 1004   BILITOT 0.90 06/12/2017 1310   BILITOT 0.92 01/09/2017 1004      Impression and Plan: Mr. Archey is an 83 year old white male. He has relapsed myeloma.   I talked to him about Aranesp. His daughter in law was with him. I explained why I thought Aranesp would be a good thing. I will like to try to get his hemoglobin above 10.  He agrees to try Aranesp. We will probably have to do this the next time he is here. I think that Aranesp would be a good idea for him.  He has done well with the Cytoxan/Kyprolis combination.  We will plan to get him back in 2 weeks.   [name], MD 11/1/20181:40 PM

## 2017-06-12 NOTE — Patient Instructions (Signed)
[facility] Discharge Instructions for Patients Receiving Chemotherapy  Today you received the following chemotherapy agents:  Kyprolis & Cytoxan  To help prevent nausea and vomiting after your treatment, we encourage you to take your nausea medication as prescribed.    If you develop nausea and vomiting that is not controlled by your nausea medication, call the clinic.   BELOW ARE SYMPTOMS THAT SHOULD BE REPORTED IMMEDIATELY:  *FEVER GREATER THAN 100.5 F  *CHILLS WITH OR WITHOUT FEVER  NAUSEA AND VOMITING THAT IS NOT CONTROLLED WITH YOUR NAUSEA MEDICATION  *UNUSUAL SHORTNESS OF BREATH  *UNUSUAL BRUISING OR BLEEDING  TENDERNESS IN MOUTH AND THROAT WITH OR WITHOUT PRESENCE OF ULCERS  *URINARY PROBLEMS  *BOWEL PROBLEMS  UNUSUAL RASH Items with * indicate a potential emergency and should be followed up as soon as possible.  Feel free to call the clinic should you have any questions or concerns. The clinic phone number is [phone].  Please show the CHEMO ALERT CARD at check-in to the Emergency Department and triage nurse.   

## 2017-06-13 DIAGNOSIS — Z1212 Encounter for screening for malignant neoplasm of rectum: Secondary | ICD-10-CM | POA: Diagnosis not present

## 2017-06-13 LAB — IRON AND TIBC
%SAT: 43 % (ref 20–55)
Iron: 98 ug/dL (ref 42–163)
TIBC: 228 ug/dL (ref 202–409)
UIBC: 130 ug/dL (ref 117–376)

## 2017-06-13 LAB — FERRITIN: FERRITIN: 182 ng/mL (ref 22–316)

## 2017-06-17 DIAGNOSIS — Z0000 Encounter for general adult medical examination without abnormal findings: Secondary | ICD-10-CM | POA: Diagnosis not present

## 2017-06-17 DIAGNOSIS — D6489 Other specified anemias: Secondary | ICD-10-CM | POA: Diagnosis not present

## 2017-06-17 DIAGNOSIS — C9000 Multiple myeloma not having achieved remission: Secondary | ICD-10-CM | POA: Diagnosis not present

## 2017-06-17 DIAGNOSIS — M859 Disorder of bone density and structure, unspecified: Secondary | ICD-10-CM | POA: Diagnosis not present

## 2017-06-17 DIAGNOSIS — Z1389 Encounter for screening for other disorder: Secondary | ICD-10-CM | POA: Diagnosis not present

## 2017-06-17 DIAGNOSIS — Z6820 Body mass index (BMI) 20.0-20.9, adult: Secondary | ICD-10-CM | POA: Diagnosis not present

## 2017-06-17 DIAGNOSIS — I10 Essential (primary) hypertension: Secondary | ICD-10-CM | POA: Diagnosis not present

## 2017-06-17 DIAGNOSIS — C44310 Basal cell carcinoma of skin of unspecified parts of face: Secondary | ICD-10-CM | POA: Diagnosis not present

## 2017-06-24 DIAGNOSIS — D234 Other benign neoplasm of skin of scalp and neck: Secondary | ICD-10-CM | POA: Diagnosis not present

## 2017-06-24 DIAGNOSIS — D485 Neoplasm of uncertain behavior of skin: Secondary | ICD-10-CM | POA: Diagnosis not present

## 2017-06-24 DIAGNOSIS — C44319 Basal cell carcinoma of skin of other parts of face: Secondary | ICD-10-CM | POA: Diagnosis not present

## 2017-06-26 ENCOUNTER — Other Ambulatory Visit: Payer: Self-pay

## 2017-06-26 ENCOUNTER — Ambulatory Visit (HOSPITAL_BASED_OUTPATIENT_CLINIC_OR_DEPARTMENT_OTHER): Payer: Medicare HMO

## 2017-06-26 ENCOUNTER — Ambulatory Visit (HOSPITAL_BASED_OUTPATIENT_CLINIC_OR_DEPARTMENT_OTHER): Payer: Medicare HMO | Admitting: Family

## 2017-06-26 ENCOUNTER — Other Ambulatory Visit (HOSPITAL_BASED_OUTPATIENT_CLINIC_OR_DEPARTMENT_OTHER): Payer: Medicare HMO

## 2017-06-26 VITALS — BP 171/85 | HR 72 | Temp 97.9°F | Resp 18 | Wt 143.0 lb

## 2017-06-26 VITALS — BP 176/79 | HR 104 | Temp 97.8°F

## 2017-06-26 DIAGNOSIS — C9000 Multiple myeloma not having achieved remission: Secondary | ICD-10-CM

## 2017-06-26 DIAGNOSIS — C9002 Multiple myeloma in relapse: Secondary | ICD-10-CM | POA: Diagnosis not present

## 2017-06-26 DIAGNOSIS — D508 Other iron deficiency anemias: Secondary | ICD-10-CM

## 2017-06-26 DIAGNOSIS — N189 Chronic kidney disease, unspecified: Secondary | ICD-10-CM

## 2017-06-26 DIAGNOSIS — D631 Anemia in chronic kidney disease: Secondary | ICD-10-CM

## 2017-06-26 DIAGNOSIS — Z5112 Encounter for antineoplastic immunotherapy: Secondary | ICD-10-CM | POA: Diagnosis not present

## 2017-06-26 DIAGNOSIS — Z5111 Encounter for antineoplastic chemotherapy: Secondary | ICD-10-CM

## 2017-06-26 LAB — CBC WITH DIFFERENTIAL (CANCER CENTER ONLY)
BASO#: 0 10*3/uL (ref 0.0–0.2)
BASO%: 0.3 % (ref 0.0–2.0)
EOS%: 3.2 % (ref 0.0–7.0)
Eosinophils Absolute: 0.2 10*3/uL (ref 0.0–0.5)
HCT: 32.4 % — ABNORMAL LOW (ref 38.7–49.9)
HEMOGLOBIN: 10.8 g/dL — AB (ref 13.0–17.1)
LYMPH#: 0.4 10*3/uL — ABNORMAL LOW (ref 0.9–3.3)
LYMPH%: 5.5 % — ABNORMAL LOW (ref 14.0–48.0)
MCH: 31.6 pg (ref 28.0–33.4)
MCHC: 33.3 g/dL (ref 32.0–35.9)
MCV: 95 fL (ref 82–98)
MONO#: 0.9 10*3/uL (ref 0.1–0.9)
MONO%: 12 % (ref 0.0–13.0)
NEUT%: 79 % (ref 40.0–80.0)
NEUTROS ABS: 5.8 10*3/uL (ref 1.5–6.5)
Platelets: 189 10*3/uL (ref 145–400)
RBC: 3.42 10*6/uL — AB (ref 4.20–5.70)
RDW: 16.9 % — ABNORMAL HIGH (ref 11.1–15.7)
WBC: 7.3 10*3/uL (ref 4.0–10.0)

## 2017-06-26 LAB — CMP (CANCER CENTER ONLY)
ALT(SGPT): 19 U/L (ref 10–47)
AST: 21 U/L (ref 11–38)
Albumin: 2.9 g/dL — ABNORMAL LOW (ref 3.3–5.5)
Alkaline Phosphatase: 93 U/L — ABNORMAL HIGH (ref 26–84)
BILIRUBIN TOTAL: 1.2 mg/dL (ref 0.20–1.60)
BUN, Bld: 19 mg/dL (ref 7–22)
CO2: 25 meq/L (ref 18–33)
CREATININE: 1.2 mg/dL (ref 0.6–1.2)
Calcium: 8.7 mg/dL (ref 8.0–10.3)
Chloride: 110 mEq/L — ABNORMAL HIGH (ref 98–108)
Glucose, Bld: 88 mg/dL (ref 73–118)
Potassium: 3.6 mEq/L (ref 3.3–4.7)
SODIUM: 144 meq/L (ref 128–145)
TOTAL PROTEIN: 6 g/dL — AB (ref 6.4–8.1)

## 2017-06-26 MED ORDER — PALONOSETRON HCL INJECTION 0.25 MG/5ML
INTRAVENOUS | Status: AC
  Filled 2017-06-26: qty 5

## 2017-06-26 MED ORDER — DEXAMETHASONE SODIUM PHOSPHATE 10 MG/ML IJ SOLN
10.0000 mg | Freq: Once | INTRAMUSCULAR | Status: AC
  Administered 2017-06-26: 10 mg via INTRAVENOUS

## 2017-06-26 MED ORDER — DEXTROSE 5 % IV SOLN
60.0000 mg | Freq: Once | INTRAVENOUS | Status: AC
  Administered 2017-06-26: 60 mg via INTRAVENOUS
  Filled 2017-06-26: qty 30

## 2017-06-26 MED ORDER — PALONOSETRON HCL INJECTION 0.25 MG/5ML
0.2500 mg | Freq: Once | INTRAVENOUS | Status: AC
  Administered 2017-06-26: 0.25 mg via INTRAVENOUS

## 2017-06-26 MED ORDER — SODIUM CHLORIDE 0.9 % IV SOLN
Freq: Once | INTRAVENOUS | Status: AC
  Administered 2017-06-26: 15:00:00 via INTRAVENOUS

## 2017-06-26 MED ORDER — DEXAMETHASONE SODIUM PHOSPHATE 10 MG/ML IJ SOLN
INTRAMUSCULAR | Status: AC
  Filled 2017-06-26: qty 1

## 2017-06-26 MED ORDER — SODIUM CHLORIDE 0.9 % IV SOLN
300.0000 mg/m2 | Freq: Once | INTRAVENOUS | Status: AC
  Administered 2017-06-26: 540 mg via INTRAVENOUS
  Filled 2017-06-26: qty 27

## 2017-06-26 NOTE — Progress Notes (Signed)
Hematology and Oncology Follow Up Visit  Christian Burns [id number] 05-05-1930 83 y.o. 06/26/2017   Principle Diagnosis:  Recurrent IgG lambda myeloma - progressive Hypercalcemia of malignancy  Current Therapy:   Kyprolis/Cytoxan - s/p cycle 13- Cytoxan restarted on cycle #13;    Kyprolis q 2 wk Zometa IV q 4 weeks - on hold Aranesp 300 g subcutaneous as needed for hemoglobin less than 10   Interim History:  Christian Burns is here today for follow-up and treatment. He is doing fairly well but has had some episodes of loose stool with antibiotics and has started taking a probiotic which he feels is starting to help.  He had the Mohs procedure on his right cheek and his doctor was also able to biopsy the swollen area under his chin during that time. The results are pending.  He feels that there may be a piece of his left back bottom molar left after his root canal that has caused an infection. He still has some swelling under the chin and along the left cheek.  His right cheek incision is healing nicely. No redness, edema or drainage indicating infection. He has had some fatigue at times and has started taking an afternoon nap.   No fever, chills, n/v, cough, rash, dizziness, SOB, chest pain, palpitations, abdominal pain or changes in bladder habits.  No swelling or tenderness in his extremities. Numbness and tingling in his hands and feet is unchanged. His appetite has improved and his weight is up 3 lbs since his last visit. He is hydrating well.   ECOG Performance Status: 2 - Symptomatic, <50% confined to bed  Medications:  Allergies as of 06/26/2017      Reactions   Sulfa Antibiotics Itching   Sulfasalazine Itching      Medication List        Accurate as of 06/26/17  9:37 PM. Always use your most recent med list.          acyclovir ointment 5 % Commonly known as:  ZOVIRAX APP EXT AA Q 3 H   amLODipine-benazepril 5-10 MG capsule Commonly known as:  LOTREL TAKE 1 CAPSULE  BY MOUTH DAILY   aspirin EC 81 MG tablet Take 81 mg by mouth daily.   metroNIDAZOLE 500 MG tablet Commonly known as:  FLAGYL TK 1 T PO  TID.   MULTIVITAL PO Take 1 tablet by mouth every morning.   pyridOXINE 100 MG tablet Commonly known as:  VITAMIN B-6 Take 100 mg by mouth daily.   VITAMIN B 12 PO Take 1,000 mcg by mouth every morning.   Vitamin D (Ergocalciferol) 50000 units Caps capsule Commonly known as:  DRISDOL Take 50,000 Units by mouth every 7 (seven) days. Sundays       Allergies:  Allergies  Allergen Reactions  . Sulfa Antibiotics Itching  . Sulfasalazine Itching    Past Medical History, Surgical history, Social history, and Family History were reviewed and updated.  Review of Systems: All other 10 point review of systems is negative.   Physical Exam:  weight is 143 lb (64.9 kg). His oral temperature is 97.9 F (36.6 C). His blood pressure is 171/85 (abnormal) and his pulse is 72. His respiration is 18 and oxygen saturation is 100%.   Wt Readings from Last 3 Encounters:  06/26/17 143 lb (64.9 kg)  06/12/17 140 lb (63.5 kg)  05/29/17 141 lb (64 kg)    Ocular: Sclerae unicteric, pupils equal, round and reactive to light Ear-nose-throat: Oropharynx clear, dentition  fair Lymphatic: No cervical, supraclavicular or axillary adenopathy Lungs no rales or rhonchi, good excursion bilaterally Heart regular rate and rhythm, no murmur appreciated Abd soft, nontender, positive bowel sounds, no liver or spleen tip palpated on exam, no fluid wave MSK no focal spinal tenderness, no joint edema Neuro: non-focal, well-oriented, appropriate affect Breasts: Deferred   Lab Results  Component Value Date   WBC 7.3 06/26/2017   HGB 10.8 (L) 06/26/2017   HCT 32.4 (L) 06/26/2017   MCV 95 06/26/2017   PLT 189 06/26/2017   Lab Results  Component Value Date   FERRITIN 182 06/12/2017   IRON 98 06/12/2017   TIBC 228 06/12/2017   UIBC 130 06/12/2017   IRONPCTSAT 43  06/12/2017   Lab Results  Component Value Date   RBC 3.42 (L) 06/26/2017   Lab Results  Component Value Date   KPAFRELGTCHN 3.11 (H) 07/28/2015   LAMBDASER 37.90 (H) 07/28/2015   KAPLAMBRATIO 0.29 05/29/2017   Lab Results  Component Value Date   IGGSERUM 654 (L) 05/29/2017   IGA 204 07/28/2015   IGMSERUM 28 05/29/2017   Lab Results  Component Value Date   TOTALPROTELP 7.9 07/28/2015   ALBUMINELP 3.7 (L) 07/28/2015   A1GS 0.3 07/28/2015   A2GS 0.7 07/28/2015   BETS 0.4 07/28/2015   BETA2SER 0.3 07/28/2015   GAMS 2.5 (H) 07/28/2015   MSPIKE 0.3 (H) 05/29/2017   SPEI * 07/28/2015     Chemistry      Component Value Date/Time   NA 144 06/26/2017 1314   NA 141 01/09/2017 1004   K 3.6 06/26/2017 1314   K 4.4 01/09/2017 1004   CL 110 (H) 06/26/2017 1314   CO2 25 06/26/2017 1314   CO2 22 01/09/2017 1004   BUN 19 06/26/2017 1314   BUN 23.9 01/09/2017 1004   CREATININE 1.2 06/26/2017 1314   CREATININE 1.5 (H) 01/09/2017 1004      Component Value Date/Time   CALCIUM 8.7 06/26/2017 1314   CALCIUM 8.8 01/09/2017 1004   ALKPHOS 93 (H) 06/26/2017 1314   ALKPHOS 80 01/09/2017 1004   AST 21 06/26/2017 1314   AST 18 01/09/2017 1004   ALT 19 06/26/2017 1314   ALT 12 01/09/2017 1004   BILITOT 1.20 06/26/2017 1314   BILITOT 0.92 01/09/2017 1004      Impression and Plan: Christian Burns is a very pleasant 83 yo caucasian gentleman with relapsed myeloma and erythropoietin deficiency anemia. He has responded nicely on Cytoxan/Kyprolis and also to the Aranesp he received at his last visit.  Hgb today is 10.8 with an MCV of 95. No injection needed at this time.  We will proceed with treatment today as planned.  He requested that his surgeon forward his pathology report to our office one these results are available.  We will plan to see him back in another 2 weeks for follow-up, lab and treatment.  He will contact our office with any questions or concerns. We can certainly see him soner  if need be.   Eliezer Bottom, NP 11/15/20189:37 PM

## 2017-06-27 LAB — KAPPA/LAMBDA LIGHT CHAINS
Ig Kappa Free Light Chain: 19.8 mg/L — ABNORMAL HIGH (ref 3.3–19.4)
Ig Lambda Free Light Chain: 76.7 mg/L — ABNORMAL HIGH (ref 5.7–26.3)
KAPPA/LAMBDA FLC RATIO: 0.26 (ref 0.26–1.65)

## 2017-06-27 LAB — IRON AND TIBC
%SAT: 15 % — ABNORMAL LOW (ref 20–55)
Iron: 35 ug/dL — ABNORMAL LOW (ref 42–163)
TIBC: 236 ug/dL (ref 202–409)
UIBC: 201 ug/dL (ref 117–376)

## 2017-06-27 LAB — IGG, IGA, IGM
IgA, Qn, Serum: 75 mg/dL (ref 61–437)
IgG, Qn, Serum: 689 mg/dL — ABNORMAL LOW (ref 700–1600)
IgM, Qn, Serum: 23 mg/dL (ref 15–143)

## 2017-06-27 LAB — FERRITIN: Ferritin: 111 ng/ml (ref 22–316)

## 2017-07-01 DIAGNOSIS — T8140XA Infection following a procedure, unspecified, initial encounter: Secondary | ICD-10-CM | POA: Diagnosis not present

## 2017-07-01 LAB — PROTEIN ELECTROPHORESIS, SERUM, WITH REFLEX
A/G Ratio: 1.2 (ref 0.7–1.7)
ALBUMIN: 3 g/dL (ref 2.9–4.4)
ALPHA 1: 0.3 g/dL (ref 0.0–0.4)
ALPHA 2: 0.7 g/dL (ref 0.4–1.0)
Beta: 0.8 g/dL (ref 0.7–1.3)
GAMMA GLOBULIN: 0.8 g/dL (ref 0.4–1.8)
Globulin, Total: 2.6 g/dL (ref 2.2–3.9)
Interpretation(See Below): 0
M-SPIKE, %: 0.5 g/dL — AB
Total Protein: 5.6 g/dL — ABNORMAL LOW (ref 6.0–8.5)

## 2017-07-08 DIAGNOSIS — H5213 Myopia, bilateral: Secondary | ICD-10-CM | POA: Diagnosis not present

## 2017-07-08 DIAGNOSIS — H01004 Unspecified blepharitis left upper eyelid: Secondary | ICD-10-CM | POA: Diagnosis not present

## 2017-07-08 DIAGNOSIS — H01001 Unspecified blepharitis right upper eyelid: Secondary | ICD-10-CM | POA: Diagnosis not present

## 2017-07-08 DIAGNOSIS — H52223 Regular astigmatism, bilateral: Secondary | ICD-10-CM | POA: Diagnosis not present

## 2017-07-08 DIAGNOSIS — H353131 Nonexudative age-related macular degeneration, bilateral, early dry stage: Secondary | ICD-10-CM | POA: Diagnosis not present

## 2017-07-08 DIAGNOSIS — H01002 Unspecified blepharitis right lower eyelid: Secondary | ICD-10-CM | POA: Diagnosis not present

## 2017-07-08 DIAGNOSIS — H524 Presbyopia: Secondary | ICD-10-CM | POA: Diagnosis not present

## 2017-07-10 ENCOUNTER — Encounter: Payer: Self-pay | Admitting: Hematology & Oncology

## 2017-07-10 ENCOUNTER — Ambulatory Visit: Payer: Medicare HMO

## 2017-07-10 ENCOUNTER — Ambulatory Visit (HOSPITAL_BASED_OUTPATIENT_CLINIC_OR_DEPARTMENT_OTHER): Payer: Medicare HMO | Admitting: Hematology & Oncology

## 2017-07-10 ENCOUNTER — Other Ambulatory Visit: Payer: Self-pay

## 2017-07-10 ENCOUNTER — Other Ambulatory Visit (HOSPITAL_BASED_OUTPATIENT_CLINIC_OR_DEPARTMENT_OTHER): Payer: Medicare HMO

## 2017-07-10 VITALS — BP 170/82 | HR 67 | Temp 97.9°F | Resp 16 | Wt 142.0 lb

## 2017-07-10 DIAGNOSIS — D508 Other iron deficiency anemias: Secondary | ICD-10-CM

## 2017-07-10 DIAGNOSIS — C9002 Multiple myeloma in relapse: Secondary | ICD-10-CM | POA: Diagnosis not present

## 2017-07-10 DIAGNOSIS — C9001 Multiple myeloma in remission: Secondary | ICD-10-CM

## 2017-07-10 LAB — CBC WITH DIFFERENTIAL (CANCER CENTER ONLY)
BASO#: 0.1 10*3/uL (ref 0.0–0.2)
BASO%: 2 % (ref 0.0–2.0)
EOS%: 3.3 % (ref 0.0–7.0)
Eosinophils Absolute: 0.2 10*3/uL (ref 0.0–0.5)
HCT: 31.1 % — ABNORMAL LOW (ref 38.7–49.9)
HGB: 10.5 g/dL — ABNORMAL LOW (ref 13.0–17.1)
LYMPH#: 0.5 10*3/uL — ABNORMAL LOW (ref 0.9–3.3)
LYMPH%: 9.4 % — AB (ref 14.0–48.0)
MCH: 31.3 pg (ref 28.0–33.4)
MCHC: 33.8 g/dL (ref 32.0–35.9)
MCV: 93 fL (ref 82–98)
MONO#: 0.6 10*3/uL (ref 0.1–0.9)
MONO%: 12.3 % (ref 0.0–13.0)
NEUT#: 3.7 10*3/uL (ref 1.5–6.5)
NEUT%: 73 % (ref 40.0–80.0)
PLATELETS: 196 10*3/uL (ref 145–400)
RBC: 3.36 10*6/uL — AB (ref 4.20–5.70)
RDW: 15.6 % (ref 11.1–15.7)
WBC: 5.1 10*3/uL (ref 4.0–10.0)

## 2017-07-10 LAB — CMP (CANCER CENTER ONLY)
ALBUMIN: 2.9 g/dL — AB (ref 3.3–5.5)
ALK PHOS: 80 U/L (ref 26–84)
ALT(SGPT): 22 U/L (ref 10–47)
AST: 22 U/L (ref 11–38)
BILIRUBIN TOTAL: 1.1 mg/dL (ref 0.20–1.60)
BUN: 22 mg/dL (ref 7–22)
CALCIUM: 8.8 mg/dL (ref 8.0–10.3)
CO2: 26 mEq/L (ref 18–33)
Chloride: 111 mEq/L — ABNORMAL HIGH (ref 98–108)
Creat: 1.3 mg/dl — ABNORMAL HIGH (ref 0.6–1.2)
Glucose, Bld: 94 mg/dL (ref 73–118)
Potassium: 3.7 mEq/L (ref 3.3–4.7)
Sodium: 144 mEq/L (ref 128–145)
Total Protein: 5.9 g/dL — ABNORMAL LOW (ref 6.4–8.1)

## 2017-07-10 NOTE — Progress Notes (Signed)
Hematology and Oncology Follow Up Visit  Christian Burns [id number] October 21, 1929 83 y.o. 07/10/2017   Principle Diagnosis:  Recurrent IgG lambda myeloma - progressive Hypercalcemia of malignancy  Current Therapy:   Kyprolis/Cytoxan - s/p cycle #14 - Cytoxan restarted on cycle #13;    Kyprolis q 2 wk Zometa IV q 4 weeks - on hold Aranesp 300 g subcutaneous as needed for hemoglobin less than 10.   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  He comes in with his daughter-in-law. It is always a lot of fun to talk with her.  The dealing with an infection on the right side of his face.  He had Mohs surgery.  This got infected.  He currently is on some antibiotics.  Problem that we have right now is that he is  I think we have to hold his treatment today.  He also is having issues with his bowels.  He is having some bowel incontinence.  I told him in the past to try Imodium.  He is still not doing this.  He is worried that Imodium will make him constipated and that this would affect his hernia.  I told him that the hernia would not be affected.  Thankfully, he did have a nice Thanksgiving.  Unfortunately, his wife could not go with him over to his son's house.  His last myeloma studies showed an M spike of 0.5 g/dL.  His IgG level was 690 mg/dL.  His lambda light chain was 7.7 mg/dL.  He has had no fever.  He has had no bleeding.  He has had the  Tingling in his feet which is about the same.  Overall, his performance status is ECOG 2.    Medications:  Allergies as of 07/10/2017      Reactions   Sulfa Antibiotics Itching   Sulfasalazine Itching      Medication List        Accurate as of 07/10/17  2:21 PM. Always use your most recent med list.          acyclovir ointment 5 % Commonly known as:  ZOVIRAX APP EXT AA Q 3 H   amLODipine-benazepril 5-10 MG capsule Commonly known as:  LOTREL TAKE 1 CAPSULE BY MOUTH DAILY   aspirin EC 81 MG tablet Take 81 mg by mouth daily.   cephALEXin 500 MG capsule Commonly known as:  KEFLEX TK ONE C PO  QID   doxycycline 100 MG capsule Commonly known as:  VIBRAMYCIN TK ONE C PO BID   metroNIDAZOLE 500 MG tablet Commonly known as:  FLAGYL TK 1 T PO  TID.   MULTIVITAL PO Take 1 tablet by mouth every morning.   pyridOXINE 100 MG tablet Commonly known as:  VITAMIN B-6 Take 100 mg by mouth daily.   VITAMIN B 12 PO Take 1,000 mcg by mouth every morning.   Vitamin D (Ergocalciferol) 50000 units Caps capsule Commonly known as:  DRISDOL Take 50,000 Units by mouth every 7 (seven) days. Sundays       Allergies:  Allergies  Allergen Reactions  . Sulfa Antibiotics Itching  . Sulfasalazine Itching    Past Medical History, Surgical history, Social history, and Family History were reviewed and updated.  Review of Systems: Review of Systems  Constitutional: Negative  for appetite change, fatigue, fever and unexpected weight change.  HENT:   Negative for lump/mass, mouth sores, sore throat and trouble swallowing.   Respiratory: Negative for cough, hemoptysis and shortness of breath.   Cardiovascular: Negative for leg swelling and palpitations.  Gastrointestinal: Negative for abdominal distention, abdominal pain, blood in stool, constipation, diarrhea, nausea and vomiting.  Genitourinary: Negative for bladder incontinence, dysuria, frequency and hematuria.   Musculoskeletal: Negative for arthralgias, back pain, gait problem and myalgias.  Skin: Negative for itching and rash.  Neurological: Negative for dizziness, extremity weakness, gait problem, headaches, numbness, seizures and speech difficulty.  Hematological: Does not bruise/bleed easily.  Psychiatric/Behavioral: Negative for depression  and sleep disturbance. The patient is not nervous/anxious.      Physical Exam:  weight is 142 lb (64.4 kg). His oral temperature is 97.9 F (36.6 C). His blood pressure is 170/82 (abnormal) and his pulse is 67. His respiration is 16 and oxygen saturation is 100%.   Wt Readings from Last 3 Encounters:  07/10/17 142 lb (64.4 kg)  06/26/17 143 lb (64.9 kg)  06/12/17 140 lb (63.5 kg)    Physical Exam  Constitutional: He is oriented to person, place, and time.  HENT:  Head: Normocephalic and atraumatic.  Mouth/Throat: Oropharynx is clear and moist.  Eyes: EOM are normal. Pupils are equal, round, and reactive to light.  Neck: Normal range of motion.  Cardiovascular: Normal rate, regular rhythm and normal heart sounds.  Pulmonary/Chest: Effort normal and breath sounds normal.  Abdominal: Soft. Bowel sounds are normal.  Musculoskeletal: Normal range of motion. He exhibits no edema, tenderness or deformity.  Lymphadenopathy:    He has no cervical adenopathy.  Neurological: He is alert and oriented to person, place, and time.  Skin: Skin is warm and dry. No rash noted. No erythema.  Psychiatric: He has a normal mood and affect. His behavior is normal. Judgment and thought content normal.  Vitals reviewed.   Lab Results  Component Value Date   WBC 5.1 07/10/2017   HGB 10.5 (L) 07/10/2017   HCT 31.1 (L) 07/10/2017   MCV 93 07/10/2017   PLT 196 07/10/2017   Lab Results  Component Value Date   FERRITIN 111 06/26/2017   IRON 35 (L) 06/26/2017   TIBC 236 06/26/2017   UIBC 201 06/26/2017   IRONPCTSAT 15 (L) 06/26/2017   Lab Results  Component Value Date   RBC 3.36 (L) 07/10/2017   Lab Results  Component Value Date   KPAFRELGTCHN 3.11 (H) 07/28/2015   LAMBDASER 37.90 (H) 07/28/2015   KAPLAMBRATIO 0.26 06/26/2017   Lab Results  Component Value Date   IGGSERUM 689 (L) 06/26/2017   IGA 204 07/28/2015   IGMSERUM 23 06/26/2017   Lab Results  Component Value Date    TOTALPROTELP 7.9 07/28/2015   ALBUMINELP 3.7 (L) 07/28/2015   A1GS 0.3 07/28/2015   A2GS 0.7 07/28/2015   BETS 0.4 07/28/2015   BETA2SER 0.3 07/28/2015   GAMS 2.5 (H) 07/28/2015   MSPIKE 0.5 (H) 06/26/2017   SPEI * 07/28/2015     Chemistry      Component Value Date/Time   NA 144 07/10/2017 1254   NA 141 01/09/2017 1004   K 3.7 07/10/2017 1254   K 4.4 01/09/2017 1004   CL 111 (H) 07/10/2017 1254   CO2 26 07/10/2017 1254   CO2 22 01/09/2017 1004   BUN 22 07/10/2017 1254   BUN 23.9 01/09/2017 1004   CREATININE 1.3 (H) 07/10/2017 1254   CREATININE  1.5 (H) 01/09/2017 1004      Component Value Date/Time   CALCIUM 8.8 07/10/2017 1254   CALCIUM 8.8 01/09/2017 1004   ALKPHOS 80 07/10/2017 1254   ALKPHOS 80 01/09/2017 1004   AST 22 07/10/2017 1254   AST 18 01/09/2017 1004   ALT 22 07/10/2017 1254   ALT 12 01/09/2017 1004   BILITOT 1.10 07/10/2017 1254   BILITOT 0.92 01/09/2017 1004      Impression and Plan: Mr. Rising is an 83 year old white male. He has relapsed myeloma.   Again, we are going to hold his treatment today.  I think this would be a very good idea so that we can let this wound on his face heal up.  We will get him back in 2 weeks.  His blood pressure is on the high side.  We will have to watch out for this, particularly if we are going to consider Aranesp for his anemia.    I spent about 25-30 minutes with him today.    [name], MD 11/29/20182:21 PM

## 2017-07-11 LAB — IRON AND TIBC
%SAT: 44 % (ref 20–55)
Iron: 105 ug/dL (ref 42–163)
TIBC: 241 ug/dL (ref 202–409)
UIBC: 136 ug/dL (ref 117–376)

## 2017-07-11 LAB — FERRITIN: Ferritin: 120 ng/ml (ref 22–316)

## 2017-07-24 ENCOUNTER — Encounter: Payer: Self-pay | Admitting: Family

## 2017-07-24 ENCOUNTER — Other Ambulatory Visit: Payer: Self-pay

## 2017-07-24 ENCOUNTER — Ambulatory Visit (HOSPITAL_BASED_OUTPATIENT_CLINIC_OR_DEPARTMENT_OTHER): Payer: Medicare HMO

## 2017-07-24 ENCOUNTER — Ambulatory Visit (HOSPITAL_BASED_OUTPATIENT_CLINIC_OR_DEPARTMENT_OTHER): Payer: Medicare HMO | Admitting: Family

## 2017-07-24 ENCOUNTER — Other Ambulatory Visit (HOSPITAL_BASED_OUTPATIENT_CLINIC_OR_DEPARTMENT_OTHER): Payer: Medicare HMO

## 2017-07-24 VITALS — BP 162/73 | HR 68 | Temp 97.9°F | Resp 18 | Wt 145.0 lb

## 2017-07-24 DIAGNOSIS — C9001 Multiple myeloma in remission: Secondary | ICD-10-CM | POA: Diagnosis not present

## 2017-07-24 DIAGNOSIS — C9002 Multiple myeloma in relapse: Secondary | ICD-10-CM

## 2017-07-24 DIAGNOSIS — D508 Other iron deficiency anemias: Secondary | ICD-10-CM

## 2017-07-24 DIAGNOSIS — Z5111 Encounter for antineoplastic chemotherapy: Secondary | ICD-10-CM | POA: Diagnosis not present

## 2017-07-24 DIAGNOSIS — Z5112 Encounter for antineoplastic immunotherapy: Secondary | ICD-10-CM

## 2017-07-24 DIAGNOSIS — C9000 Multiple myeloma not having achieved remission: Secondary | ICD-10-CM

## 2017-07-24 LAB — CBC WITH DIFFERENTIAL (CANCER CENTER ONLY)
BASO#: 0 10*3/uL (ref 0.0–0.2)
BASO%: 0.9 % (ref 0.0–2.0)
EOS%: 4.7 % (ref 0.0–7.0)
Eosinophils Absolute: 0.2 10*3/uL (ref 0.0–0.5)
HEMATOCRIT: 30.7 % — AB (ref 38.7–49.9)
HEMOGLOBIN: 10.5 g/dL — AB (ref 13.0–17.1)
LYMPH#: 0.5 10*3/uL — AB (ref 0.9–3.3)
LYMPH%: 10.8 % — ABNORMAL LOW (ref 14.0–48.0)
MCH: 31.3 pg (ref 28.0–33.4)
MCHC: 34.2 g/dL (ref 32.0–35.9)
MCV: 92 fL (ref 82–98)
MONO#: 0.6 10*3/uL (ref 0.1–0.9)
MONO%: 13.4 % — ABNORMAL HIGH (ref 0.0–13.0)
NEUT%: 70.2 % (ref 40.0–80.0)
NEUTROS ABS: 3 10*3/uL (ref 1.5–6.5)
Platelets: 145 10*3/uL (ref 145–400)
RBC: 3.35 10*6/uL — AB (ref 4.20–5.70)
RDW: 15 % (ref 11.1–15.7)
WBC: 4.3 10*3/uL (ref 4.0–10.0)

## 2017-07-24 LAB — CMP (CANCER CENTER ONLY)
ALBUMIN: 2.9 g/dL — AB (ref 3.3–5.5)
ALK PHOS: 74 U/L (ref 26–84)
ALT: 22 U/L (ref 10–47)
AST: 25 U/L (ref 11–38)
BILIRUBIN TOTAL: 1.2 mg/dL (ref 0.20–1.60)
BUN, Bld: 20 mg/dL (ref 7–22)
CALCIUM: 9.1 mg/dL (ref 8.0–10.3)
CO2: 29 mEq/L (ref 18–33)
Chloride: 107 mEq/L (ref 98–108)
Creat: 1.1 mg/dl (ref 0.6–1.2)
Glucose, Bld: 81 mg/dL (ref 73–118)
POTASSIUM: 4.1 meq/L (ref 3.3–4.7)
Sodium: 146 mEq/L — ABNORMAL HIGH (ref 128–145)
TOTAL PROTEIN: 5.9 g/dL — AB (ref 6.4–8.1)

## 2017-07-24 MED ORDER — DEXTROSE 5 % IV SOLN
60.0000 mg | Freq: Once | INTRAVENOUS | Status: AC
  Administered 2017-07-24: 60 mg via INTRAVENOUS
  Filled 2017-07-24: qty 30

## 2017-07-24 MED ORDER — PALONOSETRON HCL INJECTION 0.25 MG/5ML
INTRAVENOUS | Status: AC
  Filled 2017-07-24: qty 5

## 2017-07-24 MED ORDER — DEXAMETHASONE SODIUM PHOSPHATE 10 MG/ML IJ SOLN
INTRAMUSCULAR | Status: AC
  Filled 2017-07-24: qty 1

## 2017-07-24 MED ORDER — SODIUM CHLORIDE 0.9 % IV SOLN
Freq: Once | INTRAVENOUS | Status: AC
  Administered 2017-07-24: 13:00:00 via INTRAVENOUS

## 2017-07-24 MED ORDER — PALONOSETRON HCL INJECTION 0.25 MG/5ML
0.2500 mg | Freq: Once | INTRAVENOUS | Status: AC
  Administered 2017-07-24: 0.25 mg via INTRAVENOUS

## 2017-07-24 MED ORDER — SODIUM CHLORIDE 0.9 % IV SOLN
300.0000 mg/m2 | Freq: Once | INTRAVENOUS | Status: AC
  Administered 2017-07-24: 540 mg via INTRAVENOUS
  Filled 2017-07-24: qty 27

## 2017-07-24 MED ORDER — DEXAMETHASONE SODIUM PHOSPHATE 10 MG/ML IJ SOLN
10.0000 mg | Freq: Once | INTRAMUSCULAR | Status: AC
  Administered 2017-07-24: 10 mg via INTRAVENOUS

## 2017-07-24 NOTE — Patient Instructions (Addendum)
[facility] Discharge Instructions for Patients Receiving Chemotherapy  Today you received the following chemotherapy agents:  Kyprolis & Cytoxan  To help prevent nausea and vomiting after your treatment, we encourage you to take your nausea medication as prescribed.    If you develop nausea and vomiting that is not controlled by your nausea medication, call the clinic.   BELOW ARE SYMPTOMS THAT SHOULD BE REPORTED IMMEDIATELY:  *FEVER GREATER THAN 100.5 F  *CHILLS WITH OR WITHOUT FEVER  NAUSEA AND VOMITING THAT IS NOT CONTROLLED WITH YOUR NAUSEA MEDICATION  *UNUSUAL SHORTNESS OF BREATH  *UNUSUAL BRUISING OR BLEEDING  TENDERNESS IN MOUTH AND THROAT WITH OR WITHOUT PRESENCE OF ULCERS  *URINARY PROBLEMS  *BOWEL PROBLEMS  UNUSUAL RASH Items with * indicate a potential emergency and should be followed up as soon as possible.  Feel free to call the clinic should you have any questions or concerns. The clinic phone number is [phone].  Please show the CHEMO ALERT CARD at check-in to the Emergency Department and triage nurse.   

## 2017-07-24 NOTE — Progress Notes (Signed)
Hematology and Oncology Follow Up Visit  Christian Burns [id number] [birth year] 83 y.o. 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 He is doing much better. His right cheek incision site from the Mohs procedure has healed nicely. No redness, edema or drainage at the site. He will be following up with an oral surgeon for further assessment of his left lower jaw.  He has had no episodes of bleeding, bruising or petechiae. No lymphadenopathy found on exam.  M-spike last month was 0.5, IgG was 689 mg/dL and lambda light chains were 76.7 mg/L.  He is doing morning exercise classes during the week and states that he occasionally has a crick in his neck.  He has had no falls or syncopal episodes.  No fever, chills, n/v, cough, rash, dizziness, SOB, chest pain, palpitations, abdominal pain or changes in bladder habits. He had one episode of bowel incontinence on Tuesday. That was his first in quite a while. This seems to have improved with taking a daily probiotic.  No swelling in his extremities at this time. The neuropathy in his hands and feet is unchanged.  He has a good appetite and is staying well hydrated. His weight is up 3 lbs this visit!   ECOG Performance Status: 2 - Symptomatic, <50% confined to bed  Medications:  Allergies as of 07/24/2017      Reactions   Sulfa Antibiotics Itching   Sulfasalazine Itching      Medication List       Accurate as of 07/24/17 11:57 AM. Always use your most recent med list.          acyclovir ointment 5 % Commonly known as:  ZOVIRAX APP EXT AA Q 3 H   amLODipine-benazepril 5-10 MG capsule Commonly known as:  LOTREL TAKE 1 CAPSULE BY MOUTH DAILY   aspirin EC 81 MG tablet Take 81 mg by mouth daily.   cephALEXin 500 MG capsule Commonly known as:  KEFLEX TK ONE C PO  QID   doxycycline 100 MG capsule Commonly known as:  VIBRAMYCIN TK ONE C PO BID   metroNIDAZOLE 500 MG tablet Commonly known as:  FLAGYL TK 1 T PO  TID.   MULTIVITAL PO Take 1 tablet by mouth every morning.   pyridOXINE 100 MG tablet Commonly known as:  VITAMIN B-6 Take 100 mg by mouth daily.   VITAMIN B 12 PO Take 1,000 mcg by mouth every morning.   Vitamin D (Ergocalciferol) 50000 units Caps capsule Commonly known as:  DRISDOL Take 50,000 Units by mouth every 7 (seven) days. Sundays       Allergies:  Allergies  Allergen Reactions  . Sulfa Antibiotics Itching  . Sulfasalazine Itching    Past Medical History, Surgical history, Social history, and Family History were reviewed and updated.  Review of Systems: All other 10 point review of systems is negative.   Physical Exam:  vitals were not taken for this visit.  Wt Readings from Last 3 Encounters:  07/10/17 142 lb (64.4 kg)  06/26/17 143 lb (64.9 kg)  06/12/17 140 lb (63.5 kg)    Ocular: Sclerae unicteric, pupils equal, round and reactive to light Ear-nose-throat: Oropharynx clear, dentition fair Lymphatic: No cervical, supraclavicular or axillary adenopathy Lungs no rales or rhonchi, good excursion bilaterally Heart regular rate and rhythm, no murmur appreciated Abd soft, nontender, positive bowel sounds, no liver or spleen tip palpated on exam, no fluid wave  MSK no focal spinal tenderness, no joint edema Neuro: non-focal, well-oriented, appropriate affect Breasts: Deferred   Lab Results  Component Value Date   WBC 4.3  07/24/2017   HGB 10.5 (L) 07/24/2017   HCT 30.7 (L) 07/24/2017   MCV 92 07/24/2017   PLT 145 07/24/2017   Lab Results  Component Value Date   FERRITIN 120 07/10/2017   IRON 105 07/10/2017   TIBC 241 07/10/2017   UIBC 136 07/10/2017   IRONPCTSAT 44 07/10/2017   Lab Results  Component Value Date   RBC 3.35 (L) 07/24/2017   Lab Results  Component Value Date   KPAFRELGTCHN 3.11 (H) 07/28/2015   LAMBDASER 37.90 (H) 07/28/2015   KAPLAMBRATIO 0.26 06/26/2017   Lab Results  Component Value Date   IGGSERUM 689 (L) 06/26/2017   IGA 204 07/28/2015   IGMSERUM 23 06/26/2017   Lab Results  Component Value Date   TOTALPROTELP 7.9 07/28/2015   ALBUMINELP 3.7 (L) 07/28/2015   A1GS 0.3 07/28/2015   A2GS 0.7 07/28/2015   BETS 0.4 07/28/2015   BETA2SER 0.3 07/28/2015   GAMS 2.5 (H) 07/28/2015   MSPIKE 0.5 (H) 06/26/2017   SPEI * 07/28/2015     Chemistry      Component Value Date/Time   NA 146 (H) 07/24/2017 1124   NA 141 01/09/2017 1004   K 4.1 07/24/2017 1124   K 4.4 01/09/2017 1004   CL 107 07/24/2017 1124   CO2 29 07/24/2017 1124   CO2 22 01/09/2017 1004   BUN 20 07/24/2017 1124   BUN 23.9 01/09/2017 1004   CREATININE 1.1 07/24/2017 1124   CREATININE 1.5 (H) 01/09/2017 1004      Component Value Date/Time   CALCIUM 9.1 07/24/2017 1124   CALCIUM 8.8 01/09/2017 1004   ALKPHOS 74 07/24/2017 1124   ALKPHOS 80 01/09/2017 1004   AST 25 07/24/2017 1124   AST 18 01/09/2017 1004   ALT 22 07/24/2017 1124   ALT 12 01/09/2017 1004   BILITOT 1.20 07/24/2017 1124   BILITOT 0.92 01/09/2017 1004      Impression and Plan: Christian Burns is a very pleasant  83 yo caucasian gentleman with relapsed myeloma. He is feeling much better. His right cheek incision has finally healed and looks good.  We will proceed with treatment today as planned per Dr. Marin Olp.  He has his current treatment and appointment schedule and will be back again on 08/07/17.  He will contact our office with any  questions or concerns. We can certainly see him sooner if need be.   Laverna Peace, NP 12/13/201811:57 AM\

## 2017-07-25 LAB — KAPPA/LAMBDA LIGHT CHAINS
IG KAPPA FREE LIGHT CHAIN: 19.2 mg/L (ref 3.3–19.4)
Ig Lambda Free Light Chain: 73.8 mg/L — ABNORMAL HIGH (ref 5.7–26.3)
Kappa/Lambda FluidC Ratio: 0.26 (ref 0.26–1.65)

## 2017-07-25 LAB — IGG, IGA, IGM
IGM (IMMUNOGLOBIN M), SRM: 23 mg/dL (ref 15–143)
IgA, Qn, Serum: 86 mg/dL (ref 61–437)
IgG, Qn, Serum: 838 mg/dL (ref 700–1600)

## 2017-07-30 LAB — PROTEIN ELECTROPHORESIS, SERUM, WITH REFLEX
A/G Ratio: 1.3 (ref 0.7–1.7)
ALBUMIN: 3 g/dL (ref 2.9–4.4)
ALPHA 1: 0.2 g/dL (ref 0.0–0.4)
ALPHA 2: 0.7 g/dL (ref 0.4–1.0)
Beta: 0.8 g/dL (ref 0.7–1.3)
Gamma Globulin: 0.7 g/dL (ref 0.4–1.8)
Globulin, Total: 2.4 g/dL (ref 2.2–3.9)
INTERPRETATION(SEE BELOW): 0
M-SPIKE, %: 0.4 g/dL — AB
Total Protein: 5.4 g/dL — ABNORMAL LOW (ref 6.0–8.5)

## 2017-08-07 ENCOUNTER — Other Ambulatory Visit: Payer: Self-pay | Admitting: Family Medicine

## 2017-08-07 ENCOUNTER — Ambulatory Visit (HOSPITAL_BASED_OUTPATIENT_CLINIC_OR_DEPARTMENT_OTHER): Payer: Medicare HMO | Admitting: Hematology & Oncology

## 2017-08-07 ENCOUNTER — Ambulatory Visit (HOSPITAL_BASED_OUTPATIENT_CLINIC_OR_DEPARTMENT_OTHER): Payer: Medicare HMO

## 2017-08-07 ENCOUNTER — Ambulatory Visit (HOSPITAL_BASED_OUTPATIENT_CLINIC_OR_DEPARTMENT_OTHER)
Admission: RE | Admit: 2017-08-07 | Discharge: 2017-08-07 | Disposition: A | Discharge status: Home / Self Care | Payer: Medicare HMO | Source: Ambulatory Visit | Attending: Hematology & Oncology | Admitting: Hematology & Oncology

## 2017-08-07 ENCOUNTER — Other Ambulatory Visit: Payer: Self-pay

## 2017-08-07 ENCOUNTER — Encounter: Payer: Self-pay | Admitting: Hematology & Oncology

## 2017-08-07 ENCOUNTER — Other Ambulatory Visit (HOSPITAL_BASED_OUTPATIENT_CLINIC_OR_DEPARTMENT_OTHER): Payer: Medicare HMO

## 2017-08-07 VITALS — BP 146/66 | HR 66 | Temp 98.2°F | Wt 143.8 lb

## 2017-08-07 DIAGNOSIS — C9001 Multiple myeloma in remission: Secondary | ICD-10-CM | POA: Diagnosis not present

## 2017-08-07 DIAGNOSIS — M47892 Other spondylosis, cervical region: Secondary | ICD-10-CM | POA: Insufficient documentation

## 2017-08-07 DIAGNOSIS — M1612 Unilateral primary osteoarthritis, left hip: Secondary | ICD-10-CM | POA: Diagnosis not present

## 2017-08-07 DIAGNOSIS — C9000 Multiple myeloma not having achieved remission: Secondary | ICD-10-CM

## 2017-08-07 DIAGNOSIS — C9002 Multiple myeloma in relapse: Secondary | ICD-10-CM | POA: Diagnosis not present

## 2017-08-07 DIAGNOSIS — Z5112 Encounter for antineoplastic immunotherapy: Secondary | ICD-10-CM | POA: Diagnosis not present

## 2017-08-07 DIAGNOSIS — M47816 Spondylosis without myelopathy or radiculopathy, lumbar region: Secondary | ICD-10-CM | POA: Diagnosis not present

## 2017-08-07 DIAGNOSIS — M898X8 Other specified disorders of bone, other site: Secondary | ICD-10-CM | POA: Insufficient documentation

## 2017-08-07 DIAGNOSIS — D508 Other iron deficiency anemias: Secondary | ICD-10-CM

## 2017-08-07 LAB — CBC WITH DIFFERENTIAL (CANCER CENTER ONLY)
BASO#: 0 10*3/uL (ref 0.0–0.2)
BASO%: 0.5 % (ref 0.0–2.0)
EOS ABS: 0.3 10*3/uL (ref 0.0–0.5)
EOS%: 4.9 % (ref 0.0–7.0)
HEMATOCRIT: 30.1 % — AB (ref 38.7–49.9)
HEMOGLOBIN: 10.3 g/dL — AB (ref 13.0–17.1)
LYMPH#: 0.5 10*3/uL — AB (ref 0.9–3.3)
LYMPH%: 9.8 % — ABNORMAL LOW (ref 14.0–48.0)
MCH: 31.2 pg (ref 28.0–33.4)
MCHC: 34.2 g/dL (ref 32.0–35.9)
MCV: 91 fL (ref 82–98)
MONO#: 0.6 10*3/uL (ref 0.1–0.9)
MONO%: 10.5 % (ref 0.0–13.0)
NEUT%: 74.3 % (ref 40.0–80.0)
NEUTROS ABS: 4.1 10*3/uL (ref 1.5–6.5)
Platelets: 207 10*3/uL (ref 145–400)
RBC: 3.3 10*6/uL — AB (ref 4.20–5.70)
RDW: 14.8 % (ref 11.1–15.7)
WBC: 5.5 10*3/uL (ref 4.0–10.0)

## 2017-08-07 LAB — CMP (CANCER CENTER ONLY)
ALBUMIN: 2.9 g/dL — AB (ref 3.3–5.5)
ALT(SGPT): 20 U/L (ref 10–47)
AST: 25 U/L (ref 11–38)
Alkaline Phosphatase: 97 U/L — ABNORMAL HIGH (ref 26–84)
BILIRUBIN TOTAL: 1.2 mg/dL (ref 0.20–1.60)
BUN, Bld: 24 mg/dL — ABNORMAL HIGH (ref 7–22)
CALCIUM: 8.8 mg/dL (ref 8.0–10.3)
CHLORIDE: 108 meq/L (ref 98–108)
CO2: 26 meq/L (ref 18–33)
Creat: 1.5 mg/dl — ABNORMAL HIGH (ref 0.6–1.2)
Glucose, Bld: 89 mg/dL (ref 73–118)
Potassium: 4.6 mEq/L (ref 3.3–4.7)
Sodium: 146 mEq/L — ABNORMAL HIGH (ref 128–145)
TOTAL PROTEIN: 6.3 g/dL — AB (ref 6.4–8.1)

## 2017-08-07 MED ORDER — DEXTROSE 5 % IV SOLN
60.0000 mg | Freq: Once | INTRAVENOUS | Status: AC
  Administered 2017-08-07: 60 mg via INTRAVENOUS
  Filled 2017-08-07: qty 30

## 2017-08-07 MED ORDER — SODIUM CHLORIDE 0.9 % IV SOLN
300.0000 mg/m2 | Freq: Once | INTRAVENOUS | Status: AC
  Administered 2017-08-07: 540 mg via INTRAVENOUS
  Filled 2017-08-07: qty 27

## 2017-08-07 MED ORDER — DEXAMETHASONE SODIUM PHOSPHATE 10 MG/ML IJ SOLN
10.0000 mg | Freq: Once | INTRAMUSCULAR | Status: AC
  Administered 2017-08-07: 10 mg via INTRAVENOUS

## 2017-08-07 MED ORDER — PALONOSETRON HCL INJECTION 0.25 MG/5ML
INTRAVENOUS | Status: AC
  Filled 2017-08-07: qty 5

## 2017-08-07 MED ORDER — SODIUM CHLORIDE 0.9 % IV SOLN
Freq: Once | INTRAVENOUS | Status: AC
  Administered 2017-08-07: 14:00:00 via INTRAVENOUS

## 2017-08-07 MED ORDER — DEXAMETHASONE SODIUM PHOSPHATE 10 MG/ML IJ SOLN
INTRAMUSCULAR | Status: AC
  Filled 2017-08-07: qty 1

## 2017-08-07 MED ORDER — PALONOSETRON HCL INJECTION 0.25 MG/5ML
0.2500 mg | Freq: Once | INTRAVENOUS | Status: AC
  Administered 2017-08-07: 0.25 mg via INTRAVENOUS

## 2017-08-07 NOTE — Patient Instructions (Signed)
[facility] Discharge Instructions for Patients Receiving Chemotherapy  Today you received the following chemotherapy agents:  Kyprolis & Cytoxan  To help prevent nausea and vomiting after your treatment, we encourage you to take your nausea medication as prescribed.    If you develop nausea and vomiting that is not controlled by your nausea medication, call the clinic.   BELOW ARE SYMPTOMS THAT SHOULD BE REPORTED IMMEDIATELY:  *FEVER GREATER THAN 100.5 F  *CHILLS WITH OR WITHOUT FEVER  NAUSEA AND VOMITING THAT IS NOT CONTROLLED WITH YOUR NAUSEA MEDICATION  *UNUSUAL SHORTNESS OF BREATH  *UNUSUAL BRUISING OR BLEEDING  TENDERNESS IN MOUTH AND THROAT WITH OR WITHOUT PRESENCE OF ULCERS  *URINARY PROBLEMS  *BOWEL PROBLEMS  UNUSUAL RASH Items with * indicate a potential emergency and should be followed up as soon as possible.  Feel free to call the clinic should you have any questions or concerns. The clinic phone number is [phone].  Please show the CHEMO ALERT CARD at check-in to the Emergency Department and triage nurse.   

## 2017-08-08 ENCOUNTER — Telehealth: Payer: Self-pay | Admitting: *Deleted

## 2017-08-08 LAB — FERRITIN: FERRITIN: 131 ng/mL (ref 22–316)

## 2017-08-08 LAB — IRON AND TIBC
%SAT: 34 % (ref 20–55)
IRON: 78 ug/dL (ref 42–163)
TIBC: 229 ug/dL (ref 202–409)
UIBC: 151 ug/dL (ref 117–376)

## 2017-08-08 NOTE — Telephone Encounter (Signed)
-----   Message from [name], MD sent at 08/08/2017 11:59 AM EST ----- Call his dgtr - bad arthritis.  No myeloma in the left hip or neck.  pete

## 2017-08-11 NOTE — Progress Notes (Signed)
Hematology and Oncology Follow Up Visit  Christian Burns [id number] 03-Nov-1929 83 y.o. 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  He comes in with his daughter.  He seems to be doing pretty well.  He has had no problems with his jaw.  He is eating okay.  He is swallowing okay.  His last M spike back in mid December was 0.4 g/dL.  His IgG level was 838 mg/dL.  His lambda light chain was 7.4 mg/dL.  He has had no cough.  He has had no bowel or bladder incontinence issues.  He was having some problems in which when he urinated he would have some defecation.  This seems to be better.  There is been no fever.  His weight has been holding pretty steady.  He did have a nice Christmas.    Overall, his performance status is ECOG 2.  Medications:  Allergies as of 08/07/2017      Reactions   Sulfa Antibiotics Itching   Sulfasalazine Itching      Medication List        Accurate as of 08/07/17 11:59 PM. Always use your most recent med list.          amLODipine-benazepril 5-10 MG capsule Commonly known as:  LOTREL TAKE 1 CAPSULE BY MOUTH DAILY   cephALEXin 500 MG capsule Commonly known as:  KEFLEX TK ONE C PO  QID   doxycycline 100 MG capsule Commonly known as:  VIBRAMYCIN TK ONE C PO BID   metroNIDAZOLE 500 MG tablet Commonly known as:  FLAGYL TK 1 T PO  TID.   MULTIVITAL PO Take 1 tablet by mouth every morning.     pyridOXINE 100 MG tablet Commonly known as:  VITAMIN B-6 Take 100 mg by mouth daily.   VITAMIN B 12 PO Take 1,000 mcg by mouth every morning.   Vitamin D (Ergocalciferol) 50000 units Caps capsule Commonly known as:  DRISDOL Take 50,000 Units by mouth every 7 (seven) days. Sundays       Allergies:  Allergies  Allergen Reactions  . Sulfa Antibiotics Itching  . Sulfasalazine Itching    Past Medical History, Surgical history, Social history, and Family History were reviewed and updated.  Review of Systems: All other 10 point review of systems is negative.   Physical Exam:  weight is 143 lb 12.8 oz (65.2 kg). His oral temperature is 98.2 F (36.8 C). His blood pressure is 146/66 (abnormal) and his pulse is 66.   Wt Readings from Last 3 Encounters:  08/07/17 143 lb 12.8 oz (65.2 kg)  07/24/17 145 lb (65.8 kg)  07/10/17 142 lb (64.4 kg)    Physical Exam  Constitutional: He is oriented to person, place, and time.  HENT:  Head: Normocephalic and atraumatic.  Mouth/Throat: Oropharynx is clear and moist.  Eyes: EOM are normal. Pupils are equal, round, and reactive to light.  Neck: Normal range of motion.  Cardiovascular: Normal  rate, regular rhythm and normal heart sounds.  Pulmonary/Chest: Effort normal and breath sounds normal.  Abdominal: Soft. Bowel sounds are normal.  Musculoskeletal: Normal range of motion. He exhibits no edema, tenderness or deformity.  Lymphadenopathy:    He has no cervical adenopathy.  Neurological: He is alert and oriented to person, place, and time.  Skin: Skin is warm and dry. No rash noted. No erythema.  Psychiatric: He has a normal mood and affect. His behavior is normal. Judgment and thought content normal.  Vitals reviewed.     Lab Results  Component Value Date   WBC 5.5 08/07/2017   HGB 10.3 (L) 08/07/2017   HCT 30.1 (L) 08/07/2017   MCV 91 08/07/2017   PLT 207 08/07/2017   Lab Results  Component Value Date   FERRITIN 131  08/07/2017   IRON 78 08/07/2017   TIBC 229 08/07/2017   UIBC 151 08/07/2017   IRONPCTSAT 34 08/07/2017   Lab Results  Component Value Date   RBC 3.30 (L) 08/07/2017   Lab Results  Component Value Date   KPAFRELGTCHN 3.11 (H) 07/28/2015   LAMBDASER 37.90 (H) 07/28/2015   KAPLAMBRATIO 0.26 07/24/2017   Lab Results  Component Value Date   IGGSERUM 838 07/24/2017   IGA 204 07/28/2015   IGMSERUM 23 07/24/2017   Lab Results  Component Value Date   TOTALPROTELP 7.9 07/28/2015   ALBUMINELP 3.7 (L) 07/28/2015   A1GS 0.3 07/28/2015   A2GS 0.7 07/28/2015   BETS 0.4 07/28/2015   BETA2SER 0.3 07/28/2015   GAMS 2.5 (H) 07/28/2015   MSPIKE 0.4 (H) 07/24/2017   SPEI * 07/28/2015     Chemistry      Component Value Date/Time   NA 146 (H) 08/07/2017 1153   NA 141 01/09/2017 1004   K 4.6 08/07/2017 1153   K 4.4 01/09/2017 1004   CL 108 08/07/2017 1153   CO2 26 08/07/2017 1153   CO2 22 01/09/2017 1004   BUN 24 (H) 08/07/2017 1153   BUN 23.9 01/09/2017 1004   CREATININE 1.5 (H) 08/07/2017 1153   CREATININE 1.5 (H) 01/09/2017 1004      Component Value Date/Time   CALCIUM 8.8 08/07/2017 1153   CALCIUM 8.8 01/09/2017 1004   ALKPHOS 97 (H) 08/07/2017 1153   ALKPHOS 80 01/09/2017 1004   AST 25 08/07/2017 1153   AST 18 01/09/2017 1004   ALT 20 08/07/2017 1153   ALT 12 01/09/2017 1004   BILITOT 1.20 08/07/2017 1153   BILITOT 0.92 01/09/2017 1004      Impression and Plan: Christian Burns is a very pleasant  83 yo caucasian gentleman with relapsed myeloma.  We will go ahead with treatment.  He seems to be stabilizing fairly well.  We will plan to get him back in another couple weeks.  Hopefully, if we see a nice response, we might be able to move his treatments back a little bit longer.         Marland KitchenVolanda Napoleon, MD 12/31/20185:49 PM\

## 2017-08-21 ENCOUNTER — Inpatient Hospital Stay: Payer: Medicare HMO | Attending: Hematology & Oncology | Admitting: Hematology & Oncology

## 2017-08-21 ENCOUNTER — Inpatient Hospital Stay: Payer: Medicare HMO

## 2017-08-21 ENCOUNTER — Other Ambulatory Visit: Payer: Self-pay

## 2017-08-21 ENCOUNTER — Encounter: Payer: Self-pay | Admitting: Hematology & Oncology

## 2017-08-21 VITALS — BP 157/79 | HR 70 | Temp 98.1°F | Resp 18 | Wt 145.0 lb

## 2017-08-21 DIAGNOSIS — Z923 Personal history of irradiation: Secondary | ICD-10-CM | POA: Diagnosis not present

## 2017-08-21 DIAGNOSIS — Z5112 Encounter for antineoplastic immunotherapy: Secondary | ICD-10-CM | POA: Diagnosis present

## 2017-08-21 DIAGNOSIS — C9000 Multiple myeloma not having achieved remission: Secondary | ICD-10-CM

## 2017-08-21 DIAGNOSIS — Z5111 Encounter for antineoplastic chemotherapy: Secondary | ICD-10-CM | POA: Insufficient documentation

## 2017-08-21 DIAGNOSIS — G620 Drug-induced polyneuropathy: Secondary | ICD-10-CM | POA: Diagnosis not present

## 2017-08-21 DIAGNOSIS — C9001 Multiple myeloma in remission: Secondary | ICD-10-CM

## 2017-08-21 DIAGNOSIS — C9002 Multiple myeloma in relapse: Secondary | ICD-10-CM | POA: Insufficient documentation

## 2017-08-21 LAB — CMP (CANCER CENTER ONLY)
ALBUMIN: 3 g/dL — AB (ref 3.5–5.0)
ALT: 23 U/L (ref 0–55)
AST: 25 U/L (ref 5–34)
Alkaline Phosphatase: 93 U/L (ref 40–150)
Anion gap: 9 (ref 5–15)
BUN: 20 mg/dL (ref 7–26)
CHLORIDE: 109 mmol/L (ref 98–109)
CO2: 27 mmol/L (ref 22–29)
Calcium: 8.6 mg/dL (ref 8.4–10.4)
Creatinine: 1.1 mg/dL (ref 0.70–1.30)
GFR, Estimated: 59 mL/min — ABNORMAL LOW (ref >60)
Glucose, Bld: 89 mg/dL (ref 70–140)
Potassium: 4.2 mmol/L (ref 3.5–5.1)
SODIUM: 145 mmol/L (ref 136–145)
Total Bilirubin: 1 mg/dL (ref 0.2–1.2)
Total Protein: 6 g/dL — ABNORMAL LOW (ref 6.4–8.3)

## 2017-08-21 LAB — CBC WITH DIFFERENTIAL (CANCER CENTER ONLY)
BASOS ABS: 0.1 10*3/uL (ref 0.0–0.1)
BASOS PCT: 1 %
EOS ABS: 0.2 10*3/uL (ref 0.0–0.5)
Eosinophils Relative: 3 %
HCT: 29.9 % — ABNORMAL LOW (ref 38.7–49.9)
Hemoglobin: 10 g/dL — ABNORMAL LOW (ref 13.0–17.1)
Lymphocytes Relative: 9 %
Lymphs Abs: 0.5 10*3/uL — ABNORMAL LOW (ref 0.9–3.3)
MCH: 31.3 pg (ref 28.0–33.4)
MCHC: 33.4 g/dL (ref 32.0–35.9)
MCV: 93.4 fL (ref 82.0–98.0)
Monocytes Absolute: 0.7 10*3/uL (ref 0.1–0.9)
Monocytes Relative: 12 %
Neutro Abs: 4.2 10*3/uL (ref 1.5–6.5)
Neutrophils Relative %: 75 %
PLATELETS: 189 10*3/uL (ref 140–400)
RBC: 3.2 MIL/uL — AB (ref 4.20–5.70)
RDW: 14.6 % (ref 11.1–15.7)
WBC: 5.6 10*3/uL (ref 4.0–10.3)

## 2017-08-21 MED ORDER — SODIUM CHLORIDE 0.9 % IV SOLN
300.0000 mg/m2 | Freq: Once | INTRAVENOUS | Status: AC
  Administered 2017-08-21: 540 mg via INTRAVENOUS
  Filled 2017-08-21: qty 27

## 2017-08-21 MED ORDER — SODIUM CHLORIDE 0.9 % IV SOLN
Freq: Once | INTRAVENOUS | Status: AC
  Administered 2017-08-21: 15:00:00 via INTRAVENOUS

## 2017-08-21 MED ORDER — DEXTROSE 5 % IV SOLN
60.0000 mg | Freq: Once | INTRAVENOUS | Status: AC
  Administered 2017-08-21: 60 mg via INTRAVENOUS
  Filled 2017-08-21: qty 30

## 2017-08-21 MED ORDER — PALONOSETRON HCL INJECTION 0.25 MG/5ML
INTRAVENOUS | Status: AC
  Filled 2017-08-21: qty 5

## 2017-08-21 MED ORDER — DEXAMETHASONE SODIUM PHOSPHATE 10 MG/ML IJ SOLN
10.0000 mg | Freq: Once | INTRAMUSCULAR | Status: AC
  Administered 2017-08-21: 10 mg via INTRAVENOUS

## 2017-08-21 MED ORDER — PALONOSETRON HCL INJECTION 0.25 MG/5ML
0.2500 mg | Freq: Once | INTRAVENOUS | Status: AC
  Administered 2017-08-21: 0.25 mg via INTRAVENOUS

## 2017-08-21 MED ORDER — DEXAMETHASONE SODIUM PHOSPHATE 10 MG/ML IJ SOLN
INTRAMUSCULAR | Status: AC
  Filled 2017-08-21: qty 1

## 2017-08-21 NOTE — Progress Notes (Signed)
Hematology and Oncology Follow Up Visit  Christian Burns [id number] 31-Dec-1929 84 y.o. 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  He looks quite good.  He actually feels pretty good.  He has had no problems with his jaw.  He has had no problems with nausea or vomiting.  He had a good New Year's.  Of note, his last myeloma studies all looked pretty stable.  His M spike was 0.4 g/dL.  His IGG level was 838 mg/dL.  His lambda light chain was 7.4 mg/dL.  He has had no problems with his bowels or bladder.  He still has some neuropathy in his feet but this is really not changed.  Christian Burns is for right now overall, his performance status is ECOG 2.  Medications:  Allergies as of 08/21/2017      Reactions   Sulfa Antibiotics Itching   Sulfasalazine Itching      Medication List        Accurate as of 08/21/17  2:07 PM. Always use your most recent med list.          amLODipine-benazepril 5-10 MG capsule Commonly known as:  LOTREL TAKE 1 CAPSULE BY MOUTH DAILY   cephALEXin 500 MG capsule Commonly known as:  KEFLEX TK ONE C PO  QID   doxycycline 100 MG capsule Commonly known as:  VIBRAMYCIN TK ONE C PO BID   metroNIDAZOLE 500 MG tablet Commonly known as:  FLAGYL TK 1 T PO  TID.   MULTIVITAL PO Take 1 tablet by mouth every morning.   pyridOXINE 100 MG tablet Commonly known as:  VITAMIN B-6 Take 100 mg by mouth daily.   VITAMIN B  12 PO Take 1,000 mcg by mouth every morning.   Vitamin D (Ergocalciferol) 50000 units Caps capsule Commonly known as:  DRISDOL Take 50,000 Units by mouth every 7 (seven) days. Sundays       Allergies:  Allergies  Allergen Reactions  . Sulfa Antibiotics Itching  . Sulfasalazine Itching    Past Medical History, Surgical history, Social history, and Family History were reviewed and updated.  Review of Systems: All other 10 point review of systems is negative.   Physical Exam:  weight is 145 lb (65.8 kg). His oral temperature is 98.1 F (36.7 C). His blood pressure is 157/79 (abnormal) and his pulse is 70. His respiration is 18 and oxygen saturation is 100%.   Wt Readings from Last 3 Encounters:  08/21/17 145 lb (65.8 kg)  08/07/17 143 lb 12.8 oz (65.2 kg)  07/24/17 145 lb (65.8 kg)    Physical Exam  Constitutional: He is oriented to person, place, and time.  HENT:  Head: Normocephalic and atraumatic.  Mouth/Throat: Oropharynx is clear and moist.  Eyes: EOM are normal. Pupils are equal, round, and reactive to light.  Neck: Normal range of motion.  Cardiovascular: Normal rate, regular rhythm and normal heart sounds.  Pulmonary/Chest: Effort normal and breath sounds  normal.  Abdominal: Soft. Bowel sounds are normal.  Musculoskeletal: Normal range of motion. He exhibits no edema, tenderness or deformity.  Lymphadenopathy:    He has no cervical adenopathy.  Neurological: He is alert and oriented to person, place, and time.  Skin: Skin is warm and dry. No rash noted. No erythema.  Psychiatric: He has a normal mood and affect. His behavior is normal. Judgment and thought content normal.  Vitals reviewed.     Lab Results  Component Value Date   WBC 5.5 08/07/2017   HGB 10.3 (L) 08/07/2017   HCT 29.9 (L) 08/21/2017   MCV 93.4 08/21/2017   PLT 207 08/07/2017   Lab Results  Component Value Date   FERRITIN 131 08/07/2017   IRON 78 08/07/2017   TIBC 229 08/07/2017    UIBC 151 08/07/2017   IRONPCTSAT 34 08/07/2017   Lab Results  Component Value Date   RBC 3.20 (L) 08/21/2017   Lab Results  Component Value Date   KPAFRELGTCHN 3.11 (H) 07/28/2015   LAMBDASER 37.90 (H) 07/28/2015   KAPLAMBRATIO 0.26 07/24/2017   Lab Results  Component Value Date   IGGSERUM 838 07/24/2017   IGA 204 07/28/2015   IGMSERUM 23 07/24/2017   Lab Results  Component Value Date   TOTALPROTELP 7.9 07/28/2015   ALBUMINELP 3.7 (L) 07/28/2015   A1GS 0.3 07/28/2015   A2GS 0.7 07/28/2015   BETS 0.4 07/28/2015   BETA2SER 0.3 07/28/2015   GAMS 2.5 (H) 07/28/2015   MSPIKE 0.4 (H) 07/24/2017   SPEI * 07/28/2015     Chemistry      Component Value Date/Time   NA 145 08/21/2017 1304   NA 146 (H) 08/07/2017 1153   NA 141 01/09/2017 1004   K 4.2 08/21/2017 1304   K 4.6 08/07/2017 1153   K 4.4 01/09/2017 1004   CL 109 08/21/2017 1304   CL 108 08/07/2017 1153   CO2 27 08/21/2017 1304   CO2 26 08/07/2017 1153   CO2 22 01/09/2017 1004   BUN 20 08/21/2017 1304   BUN 24 (H) 08/07/2017 1153   BUN 23.9 01/09/2017 1004   CREATININE 1.5 (H) 08/07/2017 1153   CREATININE 1.5 (H) 01/09/2017 1004      Component Value Date/Time   CALCIUM 8.6 08/21/2017 1304   CALCIUM 8.8 08/07/2017 1153   CALCIUM 8.8 01/09/2017 1004   ALKPHOS 93 08/21/2017 1304   ALKPHOS 97 (H) 08/07/2017 1153   ALKPHOS 80 01/09/2017 1004   AST 25 08/21/2017 1304   AST 18 01/09/2017 1004   ALT 23 08/21/2017 1304   ALT 20 08/07/2017 1153   ALT 12 01/09/2017 1004   BILITOT 1.0 08/21/2017 1304   BILITOT 0.92 01/09/2017 1004      Impression and Plan: Christian Burns is a very pleasant  84 yo caucasian gentleman with relapsed myeloma.  He really is doing well.  I just do not see a problem with his myeloma right now.  We will continue to treat him with both Kyprolis and Cytoxan.  We will continue to hold his Zometa.  We will plan to get him back in another 2 or 3 weeks.   Marland KitchenVolanda Napoleon, MD 1/10/20192:07  PM\

## 2017-08-21 NOTE — Patient Instructions (Signed)
[facility] Discharge Instructions for Patients Receiving Chemotherapy  Today you received the following chemotherapy agents:  Kyprolis & Cytoxan  To help prevent nausea and vomiting after your treatment, we encourage you to take your nausea medication as prescribed.    If you develop nausea and vomiting that is not controlled by your nausea medication, call the clinic.   BELOW ARE SYMPTOMS THAT SHOULD BE REPORTED IMMEDIATELY:  *FEVER GREATER THAN 100.5 F  *CHILLS WITH OR WITHOUT FEVER  NAUSEA AND VOMITING THAT IS NOT CONTROLLED WITH YOUR NAUSEA MEDICATION  *UNUSUAL SHORTNESS OF BREATH  *UNUSUAL BRUISING OR BLEEDING  TENDERNESS IN MOUTH AND THROAT WITH OR WITHOUT PRESENCE OF ULCERS  *URINARY PROBLEMS  *BOWEL PROBLEMS  UNUSUAL RASH Items with * indicate a potential emergency and should be followed up as soon as possible.  Feel free to call the clinic should you have any questions or concerns. The clinic phone number is [phone].  Please show the CHEMO ALERT CARD at check-in to the Emergency Department and triage nurse.   

## 2017-08-22 ENCOUNTER — Other Ambulatory Visit: Payer: Self-pay | Admitting: Family

## 2017-08-22 LAB — IGG, IGA, IGM
IGA: 74 mg/dL (ref 61–437)
IGG (IMMUNOGLOBIN G), SERUM: 883 mg/dL (ref 700–1600)
IGM (IMMUNOGLOBULIN M), SRM: 25 mg/dL (ref 15–143)

## 2017-08-22 LAB — KAPPA/LAMBDA LIGHT CHAINS
KAPPA FREE LGHT CHN: 16.7 mg/L (ref 3.3–19.4)
Kappa, lambda light chain ratio: 0.2 — ABNORMAL LOW (ref 0.26–1.65)
Lambda free light chains: 84.7 mg/L — ABNORMAL HIGH (ref 5.7–26.3)

## 2017-08-28 LAB — PROTEIN ELECTROPHORESIS, SERUM, WITH REFLEX
A/G Ratio: 1.4 (ref 0.7–1.7)
ALBUMIN ELP: 3.5 g/dL (ref 2.9–4.4)
ALPHA-1-GLOBULIN: 0.2 g/dL (ref 0.0–0.4)
ALPHA-2-GLOBULIN: 0.7 g/dL (ref 0.4–1.0)
Beta Globulin: 0.8 g/dL (ref 0.7–1.3)
GAMMA GLOBULIN: 0.8 g/dL (ref 0.4–1.8)
Globulin, Total: 2.5 g/dL (ref 2.2–3.9)
M-Spike, %: 0.5 g/dL — ABNORMAL HIGH
SPEP Interpretation: 0
Total Protein ELP: 6 g/dL (ref 6.0–8.5)

## 2017-09-04 ENCOUNTER — Inpatient Hospital Stay: Payer: Medicare HMO | Admitting: Family

## 2017-09-04 ENCOUNTER — Other Ambulatory Visit: Payer: Self-pay

## 2017-09-04 ENCOUNTER — Inpatient Hospital Stay: Payer: Medicare HMO

## 2017-09-04 ENCOUNTER — Encounter: Payer: Self-pay | Admitting: Family

## 2017-09-04 VITALS — BP 150/72 | HR 68 | Temp 98.1°F | Resp 19 | Wt 144.0 lb

## 2017-09-04 DIAGNOSIS — C9002 Multiple myeloma in relapse: Secondary | ICD-10-CM | POA: Diagnosis not present

## 2017-09-04 DIAGNOSIS — G620 Drug-induced polyneuropathy: Secondary | ICD-10-CM

## 2017-09-04 DIAGNOSIS — Z923 Personal history of irradiation: Secondary | ICD-10-CM | POA: Diagnosis not present

## 2017-09-04 DIAGNOSIS — Z5112 Encounter for antineoplastic immunotherapy: Secondary | ICD-10-CM | POA: Diagnosis not present

## 2017-09-04 DIAGNOSIS — C9001 Multiple myeloma in remission: Secondary | ICD-10-CM

## 2017-09-04 DIAGNOSIS — C9000 Multiple myeloma not having achieved remission: Secondary | ICD-10-CM

## 2017-09-04 DIAGNOSIS — Z5111 Encounter for antineoplastic chemotherapy: Secondary | ICD-10-CM | POA: Diagnosis not present

## 2017-09-04 LAB — CMP (CANCER CENTER ONLY)
ALK PHOS: 80 U/L (ref 26–84)
ALT: 14 U/L (ref 0–55)
ANION GAP: 7 (ref 5–15)
AST: 26 U/L (ref 5–34)
Albumin: 3 g/dL — ABNORMAL LOW (ref 3.5–5.0)
BILIRUBIN TOTAL: 1 mg/dL (ref 0.2–1.2)
BUN: 24 mg/dL — ABNORMAL HIGH (ref 7–22)
CHLORIDE: 110 mmol/L — AB (ref 98–108)
CO2: 26 mmol/L (ref 18–33)
Calcium: 9 mg/dL (ref 8.0–10.3)
Creatinine: 1.3 mg/dL (ref 0.70–1.30)
Glucose, Bld: 86 mg/dL (ref 70–118)
Potassium: 4.3 mmol/L (ref 3.3–4.7)
Sodium: 143 mmol/L (ref 128–145)
TOTAL PROTEIN: 6.3 g/dL — AB (ref 6.4–8.1)

## 2017-09-04 LAB — CBC WITH DIFFERENTIAL (CANCER CENTER ONLY)
Basophils Absolute: 0.1 10*3/uL (ref 0.0–0.1)
Basophils Relative: 1 %
Eosinophils Absolute: 0.2 10*3/uL (ref 0.0–0.5)
Eosinophils Relative: 4 %
HCT: 30.6 % — ABNORMAL LOW (ref 38.7–49.9)
HEMOGLOBIN: 10.4 g/dL — AB (ref 13.0–17.1)
LYMPHS ABS: 0.5 10*3/uL — AB (ref 0.9–3.3)
LYMPHS PCT: 9 %
MCH: 31.3 pg (ref 28.0–33.4)
MCHC: 34 g/dL (ref 32.0–35.9)
MCV: 92.2 fL (ref 82.0–98.0)
MONOS PCT: 11 %
Monocytes Absolute: 0.6 10*3/uL (ref 0.1–0.9)
NEUTROS PCT: 75 %
Neutro Abs: 4 10*3/uL (ref 1.5–6.5)
Platelet Count: 196 10*3/uL (ref 140–400)
RBC: 3.32 MIL/uL — AB (ref 4.20–5.70)
RDW: 14.7 % (ref 11.1–15.7)
WBC: 5.3 10*3/uL (ref 4.0–10.3)

## 2017-09-04 LAB — LACTATE DEHYDROGENASE: LDH: 204 U/L (ref 125–245)

## 2017-09-04 MED ORDER — SODIUM CHLORIDE 0.9 % IV SOLN
300.0000 mg/m2 | Freq: Once | INTRAVENOUS | Status: AC
  Administered 2017-09-04: 540 mg via INTRAVENOUS
  Filled 2017-09-04: qty 27

## 2017-09-04 MED ORDER — SODIUM CHLORIDE 0.9 % IV SOLN: Freq: Once | INTRAVENOUS | Status: DC

## 2017-09-04 MED ORDER — PALONOSETRON HCL INJECTION 0.25 MG/5ML
INTRAVENOUS | Status: AC
  Filled 2017-09-04: qty 5

## 2017-09-04 MED ORDER — DEXAMETHASONE SODIUM PHOSPHATE 10 MG/ML IJ SOLN
INTRAMUSCULAR | Status: AC
  Filled 2017-09-04: qty 1

## 2017-09-04 MED ORDER — SODIUM CHLORIDE 0.9 % IV SOLN
Freq: Once | INTRAVENOUS | Status: AC
  Administered 2017-09-04: 15:00:00 via INTRAVENOUS

## 2017-09-04 MED ORDER — PALONOSETRON HCL INJECTION 0.25 MG/5ML
0.2500 mg | Freq: Once | INTRAVENOUS | Status: AC
  Administered 2017-09-04: 0.25 mg via INTRAVENOUS

## 2017-09-04 MED ORDER — CARFILZOMIB CHEMO INJECTION 60 MG
60.0000 mg | Freq: Once | INTRAVENOUS | Status: AC
  Administered 2017-09-04: 60 mg via INTRAVENOUS
  Filled 2017-09-04: qty 30

## 2017-09-04 MED ORDER — DEXAMETHASONE SODIUM PHOSPHATE 10 MG/ML IJ SOLN
10.0000 mg | Freq: Once | INTRAMUSCULAR | Status: AC
  Administered 2017-09-04: 10 mg via INTRAVENOUS

## 2017-09-04 NOTE — Progress Notes (Signed)
Hematology and Oncology Follow Up Visit  Christian Burns [id number] 01/01/1930 84 y.o. 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 He got a certified letter from his dermatology surgeon that recommended he have surgery to remove the skin cancer near his left temple. I spoke with Christian Burns and he is ok with him having this procedure.  His M-spike earlier this month was 0.5 g/dL, lambda light chain 8.47 mg/dL and IgG level 883 mg/dL.  No fever, chills, n/v, cough, rash, dizziness, SOB, chest pain, palpitations, abdominal pain or changes in bowel or bladder habits.  No swelling or tenderness in his extremities. The neuropathy in his hands and lower extremities is unchanged.  No falls or syncopal episodes.  He has maintained a good appetite but admits that he needs to hydrate better. He is eating ice cream twice a day and having essentials for breakfast. His weight is stable.   ECOG Performance Status: 1 - Symptomatic but completely ambulatory  Medications:  Allergies as of 09/04/2017      Reactions   Sulfa Antibiotics Itching   Sulfasalazine Itching      Medication List        Accurate as of 09/04/17  1:55 PM. Always use your most recent med list.          amLODipine-benazepril 5-10 MG capsule Commonly known as:  LOTREL TAKE 1 CAPSULE BY MOUTH DAILY   cephALEXin 500 MG capsule Commonly known as:  KEFLEX TK ONE C PO  QID     doxycycline 100 MG capsule Commonly known as:  VIBRAMYCIN TK ONE C PO BID   metroNIDAZOLE 500 MG tablet Commonly known as:  FLAGYL TK 1 T PO  TID.   MULTIVITAL PO Take 1 tablet by mouth every morning.   pyridOXINE 100 MG tablet Commonly known as:  VITAMIN B-6 Take 100 mg by mouth daily.   VITAMIN B 12 PO Take 1,000 mcg by mouth every morning.   Vitamin D (Ergocalciferol) 50000 units Caps capsule Commonly known as:  DRISDOL Take 50,000 Units by mouth every 7 (seven) days. Sundays       Allergies:  Allergies  Allergen Reactions  . Sulfa Antibiotics Itching  . Sulfasalazine Itching    Past Medical History, Surgical history, Social history, and Family History were reviewed and updated.  Review of Systems: All other 10 point review of systems is negative.   Physical Exam:  vitals were not taken for this visit.   Wt Readings from Last 3 Encounters:  08/21/17 145 lb (65.8 kg)  08/07/17 143 lb 12.8 oz (65.2 kg)  07/24/17 145 lb (65.8 kg)    Ocular: Sclerae unicteric, pupils equal, round and reactive to light Ear-nose-throat: Oropharynx clear, dentition fair Lymphatic: No cervical, supraclavicular or axillary adenopathy Lungs no rales or rhonchi, good excursion bilaterally Heart regular rate and rhythm, no murmur appreciated Abd soft, nontender, positive bowel sounds, no liver or spleen tip palpated on exam,  no fluid wave  MSK no focal spinal tenderness, no joint edema Neuro: non-focal, well-oriented, appropriate affect Breasts: Deferred   Lab Results  Component Value Date   WBC 5.3 09/04/2017   HGB 10.3 (L) 08/07/2017   HCT 30.6 (L) 09/04/2017   MCV 92.2 09/04/2017   PLT 196 09/04/2017   Lab Results  Component Value Date   FERRITIN 131 08/07/2017   IRON 78 08/07/2017   TIBC 229 08/07/2017   UIBC 151 08/07/2017   IRONPCTSAT 34 08/07/2017   Lab Results  Component Value Date   RBC 3.32 (L) 09/04/2017   Lab Results  Component Value Date    KPAFRELGTCHN 16.7 08/21/2017   LAMBDASER 84.7 (H) 08/21/2017   KAPLAMBRATIO 0.20 (L) 08/21/2017   Lab Results  Component Value Date   IGGSERUM 883 08/21/2017   IGA 74 08/21/2017   IGMSERUM 25 08/21/2017   Lab Results  Component Value Date   TOTALPROTELP 6.0 08/21/2017   ALBUMINELP 3.5 08/21/2017   A1GS 0.2 08/21/2017   A2GS 0.7 08/21/2017   BETS 0.8 08/21/2017   BETA2SER 0.3 07/28/2015   GAMS 0.8 08/21/2017   MSPIKE 0.5 (H) 08/21/2017   SPEI * 07/28/2015     Chemistry      Component Value Date/Time   NA 145 08/21/2017 1304   NA 146 (H) 08/07/2017 1153   NA 141 01/09/2017 1004   K 4.2 08/21/2017 1304   K 4.6 08/07/2017 1153   K 4.4 01/09/2017 1004   CL 109 08/21/2017 1304   CL 108 08/07/2017 1153   CO2 27 08/21/2017 1304   CO2 26 08/07/2017 1153   CO2 22 01/09/2017 1004   BUN 20 08/21/2017 1304   BUN 24 (H) 08/07/2017 1153   BUN 23.9 01/09/2017 1004   CREATININE 1.5 (H) 08/07/2017 1153   CREATININE 1.5 (H) 01/09/2017 1004      Component Value Date/Time   CALCIUM 8.6 08/21/2017 1304   CALCIUM 8.8 08/07/2017 1153   CALCIUM 8.8 01/09/2017 1004   ALKPHOS 93 08/21/2017 1304   ALKPHOS 97 (H) 08/07/2017 1153   ALKPHOS 80 01/09/2017 1004   AST 25 08/21/2017 1304   AST 18 01/09/2017 1004   ALT 23 08/21/2017 1304   ALT 20 08/07/2017 1153   ALT 12 01/09/2017 1004   BILITOT 1.0 08/21/2017 1304   BILITOT 0.92 01/09/2017 1004      Impression and Plan: Christian Burns is a very pleasant 84 yo caucasian gentleman with relapsed myeloma. He is looking much better.  We will proceed with treatment today as planned.  Zometa is still on hold until he hears back from his oral surgeon.  We will plan to see him back in another 2 weeks for repeat lab work and follow-up.  Both he and his sweet family know to contact our office with any questions or concerns. We can certainly see her sooner if need be.   Christian Peace, NP 1/24/20191:55 PM

## 2017-09-04 NOTE — Patient Instructions (Signed)
[facility] Discharge Instructions for Patients Receiving Chemotherapy  Today you received the following chemotherapy agents:  Kyprolis & Cytoxan  To help prevent nausea and vomiting after your treatment, we encourage you to take your nausea medication as prescribed.    If you develop nausea and vomiting that is not controlled by your nausea medication, call the clinic.   BELOW ARE SYMPTOMS THAT SHOULD BE REPORTED IMMEDIATELY:  *FEVER GREATER THAN 100.5 F  *CHILLS WITH OR WITHOUT FEVER  NAUSEA AND VOMITING THAT IS NOT CONTROLLED WITH YOUR NAUSEA MEDICATION  *UNUSUAL SHORTNESS OF BREATH  *UNUSUAL BRUISING OR BLEEDING  TENDERNESS IN MOUTH AND THROAT WITH OR WITHOUT PRESENCE OF ULCERS  *URINARY PROBLEMS  *BOWEL PROBLEMS  UNUSUAL RASH Items with * indicate a potential emergency and should be followed up as soon as possible.  Feel free to call the clinic should you have any questions or concerns. The clinic phone number is [phone].  Please show the CHEMO ALERT CARD at check-in to the Emergency Department and triage nurse.   

## 2017-09-05 LAB — IGG, IGA, IGM
IGA: 80 mg/dL (ref 61–437)
IGM (IMMUNOGLOBULIN M), SRM: 27 mg/dL (ref 15–143)
IgG (Immunoglobin G), Serum: 922 mg/dL (ref 700–1600)

## 2017-09-05 LAB — KAPPA/LAMBDA LIGHT CHAINS
KAPPA, LAMDA LIGHT CHAIN RATIO: 0.23 — AB (ref 0.26–1.65)
Kappa free light chain: 18.3 mg/L (ref 3.3–19.4)
LAMDA FREE LIGHT CHAINS: 81.3 mg/L — AB (ref 5.7–26.3)

## 2017-09-09 DIAGNOSIS — C44319 Basal cell carcinoma of skin of other parts of face: Secondary | ICD-10-CM | POA: Diagnosis not present

## 2017-09-12 LAB — PROTEIN ELECTROPHORESIS, SERUM, WITH REFLEX
A/G RATIO SPE: 1.1 (ref 0.7–1.7)
ALBUMIN ELP: 3.2 g/dL (ref 2.9–4.4)
ALPHA-1-GLOBULIN: 0.3 g/dL (ref 0.0–0.4)
Alpha-2-Globulin: 0.8 g/dL (ref 0.4–1.0)
BETA GLOBULIN: 0.9 g/dL (ref 0.7–1.3)
GAMMA GLOBULIN: 0.9 g/dL (ref 0.4–1.8)
Globulin, Total: 2.9 g/dL (ref 2.2–3.9)
M-Spike, %: 0.5 g/dL — ABNORMAL HIGH
SPEP INTERP: 0
Total Protein ELP: 6.1 g/dL (ref 6.0–8.5)

## 2017-09-18 ENCOUNTER — Encounter: Payer: Self-pay | Admitting: Hematology & Oncology

## 2017-09-18 ENCOUNTER — Inpatient Hospital Stay: Payer: Medicare HMO | Attending: Hematology & Oncology | Admitting: Hematology & Oncology

## 2017-09-18 ENCOUNTER — Other Ambulatory Visit: Payer: Self-pay

## 2017-09-18 ENCOUNTER — Inpatient Hospital Stay: Payer: Medicare HMO

## 2017-09-18 VITALS — BP 159/71 | HR 71 | Temp 98.0°F | Resp 18 | Wt 144.0 lb

## 2017-09-18 DIAGNOSIS — Z923 Personal history of irradiation: Secondary | ICD-10-CM | POA: Insufficient documentation

## 2017-09-18 DIAGNOSIS — Z5111 Encounter for antineoplastic chemotherapy: Secondary | ICD-10-CM | POA: Insufficient documentation

## 2017-09-18 DIAGNOSIS — C9002 Multiple myeloma in relapse: Secondary | ICD-10-CM

## 2017-09-18 DIAGNOSIS — C9001 Multiple myeloma in remission: Secondary | ICD-10-CM

## 2017-09-18 DIAGNOSIS — G620 Drug-induced polyneuropathy: Secondary | ICD-10-CM | POA: Insufficient documentation

## 2017-09-18 DIAGNOSIS — Z5112 Encounter for antineoplastic immunotherapy: Secondary | ICD-10-CM | POA: Insufficient documentation

## 2017-09-18 DIAGNOSIS — Z79899 Other long term (current) drug therapy: Secondary | ICD-10-CM | POA: Diagnosis not present

## 2017-09-18 DIAGNOSIS — C9000 Multiple myeloma not having achieved remission: Secondary | ICD-10-CM

## 2017-09-18 LAB — CMP (CANCER CENTER ONLY)
ALK PHOS: 78 U/L (ref 26–84)
ALT: 18 U/L (ref 0–55)
AST: 26 U/L (ref 5–34)
Albumin: 3.1 g/dL — ABNORMAL LOW (ref 3.5–5.0)
Anion gap: 6 (ref 5–15)
BILIRUBIN TOTAL: 1.2 mg/dL (ref 0.2–1.2)
BUN: 26 mg/dL — ABNORMAL HIGH (ref 7–22)
CALCIUM: 9 mg/dL (ref 8.0–10.3)
CHLORIDE: 113 mmol/L — AB (ref 98–108)
CO2: 23 mmol/L (ref 18–33)
Creatinine: 1.2 mg/dL (ref 0.70–1.30)
Glucose, Bld: 88 mg/dL (ref 73–118)
Potassium: 4.1 mmol/L (ref 3.3–4.7)
Sodium: 142 mmol/L (ref 128–145)
Total Protein: 6.3 g/dL — ABNORMAL LOW (ref 6.4–8.1)

## 2017-09-18 LAB — CBC WITH DIFFERENTIAL (CANCER CENTER ONLY)
Basophils Absolute: 0.1 10*3/uL (ref 0.0–0.1)
Basophils Relative: 1 %
Eosinophils Absolute: 0.2 10*3/uL (ref 0.0–0.5)
Eosinophils Relative: 4 %
HEMATOCRIT: 29.8 % — AB (ref 38.7–49.9)
Hemoglobin: 10.3 g/dL — ABNORMAL LOW (ref 13.0–17.1)
LYMPHS ABS: 0.4 10*3/uL — AB (ref 0.9–3.3)
Lymphocytes Relative: 9 %
MCH: 31.5 pg (ref 28.0–33.4)
MCHC: 34.6 g/dL (ref 32.0–35.9)
MCV: 91.1 fL (ref 82.0–98.0)
MONO ABS: 0.7 10*3/uL (ref 0.1–0.9)
MONOS PCT: 13 %
NEUTROS ABS: 3.8 10*3/uL (ref 1.5–6.5)
Neutrophils Relative %: 73 %
Platelet Count: 173 10*3/uL (ref 145–400)
RBC: 3.27 MIL/uL — ABNORMAL LOW (ref 4.20–5.70)
RDW: 14.8 % (ref 11.1–15.7)
WBC Count: 5.1 10*3/uL (ref 4.0–10.0)

## 2017-09-18 LAB — LACTATE DEHYDROGENASE: LDH: 178 U/L (ref 125–245)

## 2017-09-18 MED ORDER — SODIUM CHLORIDE 0.9 % IV SOLN: Freq: Once | INTRAVENOUS | Status: AC

## 2017-09-18 MED ORDER — SODIUM CHLORIDE 0.9 % IV SOLN
300.0000 mg/m2 | Freq: Once | INTRAVENOUS | Status: AC
  Administered 2017-09-18: 540 mg via INTRAVENOUS
  Filled 2017-09-18: qty 27

## 2017-09-18 MED ORDER — PALONOSETRON HCL INJECTION 0.25 MG/5ML
INTRAVENOUS | Status: AC
  Filled 2017-09-18: qty 5

## 2017-09-18 MED ORDER — DEXAMETHASONE SODIUM PHOSPHATE 10 MG/ML IJ SOLN
INTRAMUSCULAR | Status: AC
  Filled 2017-09-18: qty 1

## 2017-09-18 MED ORDER — DEXAMETHASONE SODIUM PHOSPHATE 10 MG/ML IJ SOLN
10.0000 mg | Freq: Once | INTRAMUSCULAR | Status: AC
  Administered 2017-09-18: 10 mg via INTRAVENOUS

## 2017-09-18 MED ORDER — SODIUM CHLORIDE 0.9 % IV SOLN
Freq: Once | INTRAVENOUS | Status: AC
  Administered 2017-09-18: 15:00:00 via INTRAVENOUS

## 2017-09-18 MED ORDER — DEXTROSE 5 % IV SOLN
60.0000 mg | Freq: Once | INTRAVENOUS | Status: AC
  Administered 2017-09-18: 60 mg via INTRAVENOUS
  Filled 2017-09-18: qty 30

## 2017-09-18 MED ORDER — PALONOSETRON HCL INJECTION 0.25 MG/5ML
0.2500 mg | Freq: Once | INTRAVENOUS | Status: AC
  Administered 2017-09-18: 0.25 mg via INTRAVENOUS

## 2017-09-18 NOTE — Patient Instructions (Signed)
[facility] Discharge Instructions for Patients Receiving Chemotherapy  Today you received the following chemotherapy agents Kyprolis, Cytoxan  To help prevent nausea and vomiting after your treatment, we encourage you to take your nausea medication    If you develop nausea and vomiting that is not controlled by your nausea medication, call the clinic.   BELOW ARE SYMPTOMS THAT SHOULD BE REPORTED IMMEDIATELY:  *FEVER GREATER THAN 100.5 F  *CHILLS WITH OR WITHOUT FEVER  NAUSEA AND VOMITING THAT IS NOT CONTROLLED WITH YOUR NAUSEA MEDICATION  *UNUSUAL SHORTNESS OF BREATH  *UNUSUAL BRUISING OR BLEEDING  TENDERNESS IN MOUTH AND THROAT WITH OR WITHOUT PRESENCE OF ULCERS  *URINARY PROBLEMS  *BOWEL PROBLEMS  UNUSUAL RASH Items with * indicate a potential emergency and should be followed up as soon as possible.  Feel free to call the clinic should you have any questions or concerns. The clinic phone number is [phone].  Please show the CHEMO ALERT CARD at check-in to the Emergency Department and triage nurse.   

## 2017-09-18 NOTE — Progress Notes (Signed)
Hematology and Oncology Follow Up Visit  Christian Burns [id number] 1930/06/15 84 y.o. 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  He looks quite good.  He comes in with his daughter-in-law.  He is doing well.  He really has no specific complaints.  He still has the neuropathy in his feet.  This is been chronic.  His myeloma has actually done fairly well.  We last checked his myeloma panel, his M spike was 0.5 g/dL.  His IgG was he actually feels pretty good.  He has had no problems with his jaw.  He has had no problems with nausea or vomiting.  He had a good New Year's.  Of note, his last myeloma studies all looked pretty stable.  His M spike was 0.4 g/dL.  His IGG level was 922 mg/dL.  His lambda light chain was 7.4 mg/dL.  He has had no problems with his bowels or bladder.  He does have some loose stool.  This is been chronic.  He really does not think he needs anything for it.  Currently, his overall performance status is ECOG 2.  Medications:  Allergies as of 09/18/2017      Reactions   Sulfa Antibiotics Itching   Sulfasalazine Itching      Medication List        Accurate as of 09/18/17  2:24 PM. Always use your most recent med list.          amLODipine-benazepril 5-10 MG capsule Commonly known as:  LOTREL TAKE 1 CAPSULE BY MOUTH DAILY   MULTIVITAL PO Take 1 tablet by mouth every morning.   pyridOXINE 100 MG  tablet Commonly known as:  VITAMIN B-6 Take 100 mg by mouth daily.   VITAMIN B 12 PO Take 1,000 mcg by mouth every morning.   Vitamin D (Ergocalciferol) 50000 units Caps capsule Commonly known as:  DRISDOL Take 50,000 Units by mouth every 7 (seven) days. Sundays       Allergies:  Allergies  Allergen Reactions  . Sulfa Antibiotics Itching  . Sulfasalazine Itching    Past Medical History, Surgical history, Social history, and Family History were reviewed and updated.  Review of Systems: Review of Systems  Gastrointestinal: Positive for diarrhea.  Musculoskeletal: Positive for joint pain.  Neurological: Positive for tingling.  All other systems reviewed and are negative.    Physical Exam:  weight is 144 lb (65.3 kg). His oral temperature is 98 F (36.7 C). His blood pressure is 159/71 (abnormal) and his pulse is 71. His respiration is 18 and oxygen saturation is 99%.   Wt Readings from Last 3 Encounters:  09/18/17 144 lb (65.3 kg)  09/04/17 144 lb (65.3 kg)  08/21/17 145 lb (65.8 kg)    Physical Exam  Constitutional: He is oriented to person, place, and time.  HENT:  Head: Normocephalic and atraumatic.  Mouth/Throat: Oropharynx is clear and moist.  Eyes: EOM are normal. Pupils are equal,  round, and reactive to light.  Neck: Normal range of motion.  Cardiovascular: Normal rate, regular rhythm and normal heart sounds.  Pulmonary/Chest: Effort normal and breath sounds normal.  Abdominal: Soft. Bowel sounds are normal.  Musculoskeletal: Normal range of motion. He exhibits no edema, tenderness or deformity.  Lymphadenopathy:    He has no cervical adenopathy.  Neurological: He is alert and oriented to person, place, and time.  Skin: Skin is warm and dry. No rash noted. No erythema.  Psychiatric: He has a normal mood and affect. His behavior is normal. Judgment and thought content normal.  Vitals reviewed.     Lab Results  Component Value Date   WBC 5.1 09/18/2017    HGB 10.3 (L) 08/07/2017   HCT 29.8 (L) 09/18/2017   MCV 91.1 09/18/2017   PLT 173 09/18/2017   Lab Results  Component Value Date   FERRITIN 131 08/07/2017   IRON 78 08/07/2017   TIBC 229 08/07/2017   UIBC 151 08/07/2017   IRONPCTSAT 34 08/07/2017   Lab Results  Component Value Date   RBC 3.27 (L) 09/18/2017   Lab Results  Component Value Date   KPAFRELGTCHN 18.3 09/04/2017   LAMBDASER 81.3 (H) 09/04/2017   KAPLAMBRATIO 0.23 (L) 09/04/2017   Lab Results  Component Value Date   IGGSERUM 922 09/04/2017   IGA 80 09/04/2017   IGMSERUM 27 09/04/2017   Lab Results  Component Value Date   TOTALPROTELP 6.1 09/04/2017   ALBUMINELP 3.2 09/04/2017   A1GS 0.3 09/04/2017   A2GS 0.8 09/04/2017   BETS 0.9 09/04/2017   BETA2SER 0.3 07/28/2015   GAMS 0.9 09/04/2017   MSPIKE 0.5 (H) 09/04/2017   SPEI * 07/28/2015     Chemistry      Component Value Date/Time   NA 142 09/18/2017 1254   NA 146 (H) 08/07/2017 1153   NA 141 01/09/2017 1004   K 4.1 09/18/2017 1254   K 4.6 08/07/2017 1153   K 4.4 01/09/2017 1004   CL 113 (H) 09/18/2017 1254   CL 108 08/07/2017 1153   CO2 23 09/18/2017 1254   CO2 26 08/07/2017 1153   CO2 22 01/09/2017 1004   BUN 26 (H) 09/18/2017 1254   BUN 24 (H) 08/07/2017 1153   BUN 23.9 01/09/2017 1004   CREATININE 1.20 09/18/2017 1254   CREATININE 1.5 (H) 08/07/2017 1153   CREATININE 1.5 (H) 01/09/2017 1004      Component Value Date/Time   CALCIUM 9.0 09/18/2017 1254   CALCIUM 8.8 08/07/2017 1153   CALCIUM 8.8 01/09/2017 1004   ALKPHOS 78 09/18/2017 1254   ALKPHOS 97 (H) 08/07/2017 1153   ALKPHOS 80 01/09/2017 1004   AST 26 09/18/2017 1254   AST 18 01/09/2017 1004   ALT 18 09/18/2017 1254   ALT 20 08/07/2017 1153   ALT 12 01/09/2017 1004   BILITOT 1.2 09/18/2017 1254   BILITOT 0.92 01/09/2017 1004      Impression and Plan: Christian Burns is a very pleasant  84 yo caucasian gentleman with relapsed myeloma.  I really think he is done  nicely.  We will go ahead and start his 17th cycle of treatment.  He comes in every 2 weeks for treatment.  We will see him back in 1 month for his next cycle.  Every find that he is progressing, I think our next option would be Daratumumab.   Marland KitchenVolanda Napoleon, MD 2/7/20192:24 PM\

## 2017-10-02 ENCOUNTER — Inpatient Hospital Stay: Payer: Medicare HMO

## 2017-10-02 ENCOUNTER — Other Ambulatory Visit: Payer: Self-pay

## 2017-10-02 ENCOUNTER — Encounter: Payer: Self-pay | Admitting: Hematology & Oncology

## 2017-10-02 ENCOUNTER — Inpatient Hospital Stay (HOSPITAL_BASED_OUTPATIENT_CLINIC_OR_DEPARTMENT_OTHER): Payer: Medicare HMO | Admitting: Hematology & Oncology

## 2017-10-02 VITALS — BP 152/71 | HR 73 | Temp 98.0°F | Resp 18 | Wt 148.0 lb

## 2017-10-02 DIAGNOSIS — C9002 Multiple myeloma in relapse: Secondary | ICD-10-CM

## 2017-10-02 DIAGNOSIS — Z923 Personal history of irradiation: Secondary | ICD-10-CM

## 2017-10-02 DIAGNOSIS — C9001 Multiple myeloma in remission: Secondary | ICD-10-CM

## 2017-10-02 DIAGNOSIS — Z5112 Encounter for antineoplastic immunotherapy: Secondary | ICD-10-CM | POA: Diagnosis not present

## 2017-10-02 DIAGNOSIS — G620 Drug-induced polyneuropathy: Secondary | ICD-10-CM

## 2017-10-02 DIAGNOSIS — Z79899 Other long term (current) drug therapy: Secondary | ICD-10-CM | POA: Diagnosis not present

## 2017-10-02 DIAGNOSIS — Z5111 Encounter for antineoplastic chemotherapy: Secondary | ICD-10-CM | POA: Diagnosis not present

## 2017-10-02 DIAGNOSIS — C9000 Multiple myeloma not having achieved remission: Secondary | ICD-10-CM

## 2017-10-02 LAB — CMP (CANCER CENTER ONLY)
ALT: 20 U/L (ref 10–47)
ANION GAP: 8 (ref 5–15)
AST: 24 U/L (ref 11–38)
Albumin: 3 g/dL — ABNORMAL LOW (ref 3.5–5.0)
Alkaline Phosphatase: 88 U/L — ABNORMAL HIGH (ref 26–84)
BILIRUBIN TOTAL: 1.1 mg/dL (ref 0.2–1.6)
BUN: 27 mg/dL — ABNORMAL HIGH (ref 7–22)
CALCIUM: 9.2 mg/dL (ref 8.0–10.3)
CHLORIDE: 112 mmol/L — AB (ref 98–108)
CO2: 24 mmol/L (ref 18–33)
Creatinine: 1.4 mg/dL — ABNORMAL HIGH (ref 0.60–1.20)
GLUCOSE: 129 mg/dL — AB (ref 73–118)
POTASSIUM: 4.4 mmol/L (ref 3.3–4.7)
Sodium: 144 mmol/L (ref 128–145)
Total Protein: 6.2 g/dL — ABNORMAL LOW (ref 6.4–8.1)

## 2017-10-02 LAB — CBC WITH DIFFERENTIAL (CANCER CENTER ONLY)
BASOS PCT: 1 %
Basophils Absolute: 0.1 10*3/uL (ref 0.0–0.1)
Eosinophils Absolute: 0.3 10*3/uL (ref 0.0–0.5)
Eosinophils Relative: 5 %
HEMATOCRIT: 27.7 % — AB (ref 38.7–49.9)
HEMOGLOBIN: 9.5 g/dL — AB (ref 13.0–17.1)
LYMPHS PCT: 9 %
Lymphs Abs: 0.5 10*3/uL — ABNORMAL LOW (ref 0.9–3.3)
MCH: 31.8 pg (ref 28.0–33.4)
MCHC: 34.3 g/dL (ref 32.0–35.9)
MCV: 92.6 fL (ref 82.0–98.0)
MONOS PCT: 11 %
Monocytes Absolute: 0.6 10*3/uL (ref 0.1–0.9)
NEUTROS ABS: 4.1 10*3/uL (ref 1.5–6.5)
NEUTROS PCT: 74 %
Platelet Count: 166 10*3/uL (ref 145–400)
RBC: 2.99 MIL/uL — ABNORMAL LOW (ref 4.20–5.70)
RDW: 14.9 % (ref 11.1–15.7)
WBC Count: 5.6 10*3/uL (ref 4.0–10.0)

## 2017-10-02 MED ORDER — PALONOSETRON HCL INJECTION 0.25 MG/5ML
INTRAVENOUS | Status: AC
  Filled 2017-10-02: qty 5

## 2017-10-02 MED ORDER — SODIUM CHLORIDE 0.9 % IV SOLN
300.0000 mg/m2 | Freq: Once | INTRAVENOUS | Status: AC
  Administered 2017-10-02: 540 mg via INTRAVENOUS
  Filled 2017-10-02: qty 27

## 2017-10-02 MED ORDER — PALONOSETRON HCL INJECTION 0.25 MG/5ML
0.2500 mg | Freq: Once | INTRAVENOUS | Status: AC
  Administered 2017-10-02: 0.25 mg via INTRAVENOUS

## 2017-10-02 MED ORDER — DEXAMETHASONE SODIUM PHOSPHATE 10 MG/ML IJ SOLN
10.0000 mg | Freq: Once | INTRAMUSCULAR | Status: AC
  Administered 2017-10-02: 10 mg via INTRAVENOUS

## 2017-10-02 MED ORDER — CARFILZOMIB CHEMO INJECTION 60 MG
60.0000 mg | Freq: Once | INTRAVENOUS | Status: AC
  Administered 2017-10-02: 60 mg via INTRAVENOUS
  Filled 2017-10-02: qty 30

## 2017-10-02 MED ORDER — SODIUM CHLORIDE 0.9 % IV SOLN
Freq: Once | INTRAVENOUS | Status: AC
  Administered 2017-10-02: 15:00:00 via INTRAVENOUS

## 2017-10-02 MED ORDER — DARBEPOETIN ALFA 300 MCG/0.6ML IJ SOSY: 300.0000 ug | PREFILLED_SYRINGE | INTRAMUSCULAR | Status: DC

## 2017-10-02 NOTE — Progress Notes (Signed)
Hematology and Oncology Follow Up Visit  Christian Burns [id number] Jun 06, 1930 84 y.o. 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  He looks quite good.  He comes in with his grandson.  He is a really interesting man.  I enjoyed talking to him.  He is doing well.  He really has no specific complaints.  He still has the neuropathy in his feet.  This is been chronic.  Of note, his last myeloma studies all looked pretty stable.  His M spike was 0.4 g/dL.  His IGG level was 922 mg/dL.  His lambda light chain was 8.1 mg/dL.  He has had no problems with his bowels or bladder.  He does have some loose stool.  This is been chronic.  He really does not think he needs anything for it.  Currently, his overall performance status is ECOG 2.  Medications:  Allergies as of 10/02/2017      Reactions   Sulfa Antibiotics Itching   Sulfasalazine Itching      Medication List        Accurate as of 10/02/17  2:13 PM. Always use your most recent med list.          amLODipine-benazepril 5-10 MG capsule Commonly known as:  LOTREL TAKE 1 CAPSULE BY MOUTH DAILY   MULTIVITAL PO Take 1 tablet by mouth every morning.   pyridOXINE 100 MG tablet Commonly known as:  VITAMIN B-6 Take 100 mg by mouth daily.   VITAMIN B 12 PO Take 1,000 mcg by mouth every morning.   Vitamin D (Ergocalciferol) 50000 units Caps capsule Commonly known as:  DRISDOL Take  50,000 Units by mouth every 7 (seven) days. Sundays       Allergies:  Allergies  Allergen Reactions  . Sulfa Antibiotics Itching  . Sulfasalazine Itching    Past Medical History, Surgical history, Social history, and Family History were reviewed and updated.  Review of Systems: Review of Systems  Gastrointestinal: Positive for diarrhea.  Musculoskeletal: Positive for joint pain.  Neurological: Positive for tingling.  All other systems reviewed and are negative.    Physical Exam:  weight is 148 lb (67.1 kg). His oral temperature is 98 F (36.7 C). His blood pressure is 152/71 (abnormal) and his pulse is 73. His respiration is 18 and oxygen saturation is 100%.   Wt Readings from Last 3 Encounters:  10/02/17 148 lb (67.1 kg)  09/18/17 144 lb (65.3 kg)  09/04/17 144 lb (65.3 kg)    Physical Exam  Constitutional: He is oriented to person, place, and time.  HENT:  Head: Normocephalic and atraumatic.  Mouth/Throat: Oropharynx is clear and moist.  Eyes: EOM are normal. Pupils are equal, round, and reactive to light.  Neck: Normal range of motion.  Cardiovascular: Normal rate, regular rhythm and normal heart sounds.  Pulmonary/Chest: Effort normal and breath sounds normal.  Abdominal: Soft. Bowel sounds are normal.  Musculoskeletal: Normal range of motion. He  exhibits no edema, tenderness or deformity.  Lymphadenopathy:    He has no cervical adenopathy.  Neurological: He is alert and oriented to person, place, and time.  Skin: Skin is warm and dry. No rash noted. No erythema.  Psychiatric: He has a normal mood and affect. His behavior is normal. Judgment and thought content normal.  Vitals reviewed.     Lab Results  Component Value Date   WBC 5.6 10/02/2017   HGB 10.3 (L) 08/07/2017   HCT 27.7 (L) 10/02/2017   MCV 92.6 10/02/2017   PLT 166 10/02/2017   Lab Results  Component Value Date   FERRITIN 131 08/07/2017   IRON 78 08/07/2017   TIBC 229 08/07/2017   UIBC  151 08/07/2017   IRONPCTSAT 34 08/07/2017   Lab Results  Component Value Date   RBC 2.99 (L) 10/02/2017   Lab Results  Component Value Date   KPAFRELGTCHN 18.3 09/04/2017   LAMBDASER 81.3 (H) 09/04/2017   KAPLAMBRATIO 0.23 (L) 09/04/2017   Lab Results  Component Value Date   IGGSERUM 922 09/04/2017   IGA 80 09/04/2017   IGMSERUM 27 09/04/2017   Lab Results  Component Value Date   TOTALPROTELP 6.1 09/04/2017   ALBUMINELP 3.2 09/04/2017   A1GS 0.3 09/04/2017   A2GS 0.8 09/04/2017   BETS 0.9 09/04/2017   BETA2SER 0.3 07/28/2015   GAMS 0.9 09/04/2017   MSPIKE 0.5 (H) 09/04/2017   SPEI * 07/28/2015     Chemistry      Component Value Date/Time   NA 144 10/02/2017 1305   NA 146 (H) 08/07/2017 1153   NA 141 01/09/2017 1004   K 4.4 10/02/2017 1305   K 4.6 08/07/2017 1153   K 4.4 01/09/2017 1004   CL 112 (H) 10/02/2017 1305   CL 108 08/07/2017 1153   CO2 24 10/02/2017 1305   CO2 26 08/07/2017 1153   CO2 22 01/09/2017 1004   BUN 27 (H) 10/02/2017 1305   BUN 24 (H) 08/07/2017 1153   BUN 23.9 01/09/2017 1004   CREATININE 1.40 (H) 10/02/2017 1305   CREATININE 1.5 (H) 08/07/2017 1153   CREATININE 1.5 (H) 01/09/2017 1004      Component Value Date/Time   CALCIUM 9.2 10/02/2017 1305   CALCIUM 8.8 08/07/2017 1153   CALCIUM 8.8 01/09/2017 1004   ALKPHOS 88 (H) 10/02/2017 1305   ALKPHOS 97 (H) 08/07/2017 1153   ALKPHOS 80 01/09/2017 1004   AST 24 10/02/2017 1305   AST 18 01/09/2017 1004   ALT 20 10/02/2017 1305   ALT 20 08/07/2017 1153   ALT 12 01/09/2017 1004   BILITOT 1.1 10/02/2017 1305   BILITOT 0.92 01/09/2017 1004      Impression and Plan: Christian Burns is a very pleasant  84 yo caucasian gentleman with relapsed myeloma.  I really think he is done nicely.  We will go ahead and start his 17th cycle of treatment.  He comes in every 2 weeks for treatment.  I think we can hold on Aranesp.  His hemoglobin is just below 10.  He is really not symptomatic so I am not  sure what benefit we would be gaining by giving him Aranesp.  We will see him back in 1 month for his next cycle.  Every find that he is progressing, I think our next option would be Daratumumab.   Marland KitchenVolanda Napoleon, MD 2/21/20192:13 PM\

## 2017-10-02 NOTE — Patient Instructions (Signed)
[facility] Discharge Instructions for Patients Receiving Chemotherapy  Today you received the following chemotherapy agents Kyprolis, Cytoxan  To help prevent nausea and vomiting after your treatment, we encourage you to take your nausea medication    If you develop nausea and vomiting that is not controlled by your nausea medication, call the clinic.   BELOW ARE SYMPTOMS THAT SHOULD BE REPORTED IMMEDIATELY:  *FEVER GREATER THAN 100.5 F  *CHILLS WITH OR WITHOUT FEVER  NAUSEA AND VOMITING THAT IS NOT CONTROLLED WITH YOUR NAUSEA MEDICATION  *UNUSUAL SHORTNESS OF BREATH  *UNUSUAL BRUISING OR BLEEDING  TENDERNESS IN MOUTH AND THROAT WITH OR WITHOUT PRESENCE OF ULCERS  *URINARY PROBLEMS  *BOWEL PROBLEMS  UNUSUAL RASH Items with * indicate a potential emergency and should be followed up as soon as possible.  Feel free to call the clinic should you have any questions or concerns. The clinic phone number is [phone].  Please show the CHEMO ALERT CARD at check-in to the Emergency Department and triage nurse.   

## 2017-10-03 ENCOUNTER — Other Ambulatory Visit: Payer: Self-pay | Admitting: Hematology & Oncology

## 2017-10-03 DIAGNOSIS — I10 Essential (primary) hypertension: Secondary | ICD-10-CM

## 2017-10-03 LAB — KAPPA/LAMBDA LIGHT CHAINS
KAPPA, LAMDA LIGHT CHAIN RATIO: 0.27 (ref 0.26–1.65)
Kappa free light chain: 17.5 mg/L (ref 3.3–19.4)
Lambda free light chains: 64.9 mg/L — ABNORMAL HIGH (ref 5.7–26.3)

## 2017-10-03 LAB — IGG, IGA, IGM
IGA: 68 mg/dL (ref 61–437)
IGG (IMMUNOGLOBIN G), SERUM: 863 mg/dL (ref 700–1600)
IGM (IMMUNOGLOBULIN M), SRM: 23 mg/dL (ref 15–143)

## 2017-10-07 LAB — PROTEIN ELECTROPHORESIS, SERUM, WITH REFLEX
A/G Ratio: 1.4 (ref 0.7–1.7)
ALPHA-1-GLOBULIN: 0.2 g/dL (ref 0.0–0.4)
Albumin ELP: 3.5 g/dL (ref 2.9–4.4)
Alpha-2-Globulin: 0.7 g/dL (ref 0.4–1.0)
Beta Globulin: 0.8 g/dL (ref 0.7–1.3)
Gamma Globulin: 0.8 g/dL (ref 0.4–1.8)
Globulin, Total: 2.5 g/dL (ref 2.2–3.9)
M-SPIKE, %: 0.6 g/dL — AB
SPEP Interpretation: 0
TOTAL PROTEIN ELP: 6 g/dL (ref 6.0–8.5)

## 2017-10-16 ENCOUNTER — Inpatient Hospital Stay: Payer: Medicare HMO | Attending: Family | Admitting: Family

## 2017-10-16 ENCOUNTER — Other Ambulatory Visit: Payer: Self-pay

## 2017-10-16 ENCOUNTER — Inpatient Hospital Stay: Payer: Medicare HMO

## 2017-10-16 VITALS — BP 155/73 | HR 81 | Temp 97.9°F | Resp 16 | Wt 145.0 lb

## 2017-10-16 DIAGNOSIS — G620 Drug-induced polyneuropathy: Secondary | ICD-10-CM | POA: Insufficient documentation

## 2017-10-16 DIAGNOSIS — Z923 Personal history of irradiation: Secondary | ICD-10-CM | POA: Diagnosis not present

## 2017-10-16 DIAGNOSIS — R0602 Shortness of breath: Secondary | ICD-10-CM | POA: Insufficient documentation

## 2017-10-16 DIAGNOSIS — C9002 Multiple myeloma in relapse: Secondary | ICD-10-CM

## 2017-10-16 DIAGNOSIS — Z5112 Encounter for antineoplastic immunotherapy: Secondary | ICD-10-CM | POA: Insufficient documentation

## 2017-10-16 DIAGNOSIS — C9001 Multiple myeloma in remission: Secondary | ICD-10-CM

## 2017-10-16 DIAGNOSIS — Z5111 Encounter for antineoplastic chemotherapy: Secondary | ICD-10-CM | POA: Insufficient documentation

## 2017-10-16 DIAGNOSIS — Z79899 Other long term (current) drug therapy: Secondary | ICD-10-CM | POA: Diagnosis not present

## 2017-10-16 DIAGNOSIS — D508 Other iron deficiency anemias: Secondary | ICD-10-CM

## 2017-10-16 DIAGNOSIS — C9000 Multiple myeloma not having achieved remission: Secondary | ICD-10-CM

## 2017-10-16 LAB — CMP (CANCER CENTER ONLY)
ALBUMIN: 3.1 g/dL — AB (ref 3.5–5.0)
ALK PHOS: 90 U/L — AB (ref 26–84)
ALT: 23 U/L (ref 10–47)
ANION GAP: 5 (ref 5–15)
AST: 28 U/L (ref 11–38)
BILIRUBIN TOTAL: 1 mg/dL (ref 0.2–1.6)
BUN: 22 mg/dL (ref 7–22)
CALCIUM: 8.9 mg/dL (ref 8.0–10.3)
CO2: 26 mmol/L (ref 18–33)
CREATININE: 1.2 mg/dL (ref 0.60–1.20)
Chloride: 112 mmol/L — ABNORMAL HIGH (ref 98–108)
Glucose, Bld: 99 mg/dL (ref 73–118)
Potassium: 4.2 mmol/L (ref 3.3–4.7)
Sodium: 143 mmol/L (ref 128–145)
TOTAL PROTEIN: 6 g/dL — AB (ref 6.4–8.1)

## 2017-10-16 LAB — CBC WITH DIFFERENTIAL (CANCER CENTER ONLY)
BASOS PCT: 1 %
Basophils Absolute: 0 10*3/uL (ref 0.0–0.1)
Eosinophils Absolute: 0.3 10*3/uL (ref 0.0–0.5)
Eosinophils Relative: 8 %
HCT: 27.2 % — ABNORMAL LOW (ref 38.7–49.9)
HEMOGLOBIN: 9.3 g/dL — AB (ref 13.0–17.1)
LYMPHS PCT: 6 %
Lymphs Abs: 0.3 10*3/uL — ABNORMAL LOW (ref 0.9–3.3)
MCH: 32.3 pg (ref 28.0–33.4)
MCHC: 34.2 g/dL (ref 32.0–35.9)
MCV: 94.4 fL (ref 82.0–98.0)
MONO ABS: 0.8 10*3/uL (ref 0.1–0.9)
Monocytes Relative: 20 %
NEUTROS ABS: 2.7 10*3/uL (ref 1.5–6.5)
NEUTROS PCT: 65 %
Platelet Count: 178 10*3/uL (ref 145–400)
RBC: 2.88 MIL/uL — AB (ref 4.20–5.70)
RDW: 14.5 % (ref 11.1–15.7)
WBC: 4.1 10*3/uL (ref 4.0–10.0)

## 2017-10-16 MED ORDER — SODIUM CHLORIDE 0.9 % IV SOLN
Freq: Once | INTRAVENOUS | Status: AC
  Administered 2017-10-16: 15:00:00 via INTRAVENOUS

## 2017-10-16 MED ORDER — DEXAMETHASONE SODIUM PHOSPHATE 10 MG/ML IJ SOLN
INTRAMUSCULAR | Status: AC
  Filled 2017-10-16: qty 1

## 2017-10-16 MED ORDER — DEXAMETHASONE SODIUM PHOSPHATE 10 MG/ML IJ SOLN
10.0000 mg | Freq: Once | INTRAMUSCULAR | Status: AC
  Administered 2017-10-16: 10 mg via INTRAVENOUS

## 2017-10-16 MED ORDER — CARFILZOMIB CHEMO INJECTION 60 MG
60.0000 mg | Freq: Once | INTRAVENOUS | Status: AC
  Administered 2017-10-16: 60 mg via INTRAVENOUS
  Filled 2017-10-16: qty 30

## 2017-10-16 MED ORDER — SODIUM CHLORIDE 0.9 % IV SOLN
300.0000 mg/m2 | Freq: Once | INTRAVENOUS | Status: AC
  Administered 2017-10-16: 540 mg via INTRAVENOUS
  Filled 2017-10-16: qty 27

## 2017-10-16 MED ORDER — PALONOSETRON HCL INJECTION 0.25 MG/5ML
INTRAVENOUS | Status: AC
  Filled 2017-10-16: qty 5

## 2017-10-16 MED ORDER — PALONOSETRON HCL INJECTION 0.25 MG/5ML
0.2500 mg | Freq: Once | INTRAVENOUS | Status: AC
  Administered 2017-10-16: 0.25 mg via INTRAVENOUS

## 2017-10-16 NOTE — Progress Notes (Addendum)
Hematology and Oncology Follow Up Visit  Christian Burns [id number] [birth year] 84 y.o. 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 He is doing well and has no complaints at this time.  M-spike in 0.6 g/dL, IgG 863 mg/dL and Lamda light chain 6.49 mg/dL.  Hgb is 9.3, WBC count 4.1 and platelets 178. No fever, chills, n/v, cough, rash, dizziness, SOB, chest pain, palpitations, abdominal pain or changes in bowel or bladder habits.  No fever, chills, n/v, cough, rash, dizziness, chest pain, palpitations, abdominal pain or changes in bowel or bladder habits.  He has some mild SOB with exertion and will take breaks as needed.  No swelling or tenderness in his extremities. The numbness and tingling in his feet up to his calves is unchanged.  His appetite has improved some and he is staying hydrated. His weight is stable.   ECOG Performance Status: 1 - Symptomatic but completely ambulatory  Medications:  Allergies as of 10/16/2017      Reactions   Sulfa Antibiotics Itching   Sulfasalazine Itching      Medication List        Accurate as of 10/16/17  1:35 PM. Always use your most recent med list.          amLODipine-benazepril 5-10 MG capsule Commonly known as:  LOTREL TAKE 1 CAPSULE BY MOUTH DAILY   amLODipine-benazepril 5-10 MG capsule Commonly known as:  LOTREL TAKE 1 CAPSULE BY MOUTH DAILY   MULTIVITAL PO Take 1 tablet by mouth every morning.     pyridOXINE 100 MG tablet Commonly known as:  VITAMIN B-6 Take 100 mg by mouth daily.   VITAMIN B 12 PO Take 1,000 mcg by mouth every morning.   Vitamin D (Ergocalciferol) 50000 units Caps capsule Commonly known as:  DRISDOL Take 50,000 Units by mouth every 7 (seven) days. Sundays       Allergies:  Allergies  Allergen Reactions  . Sulfa Antibiotics Itching  . Sulfasalazine Itching    Past Medical History, Surgical history, Social history, and Family History were reviewed and updated.  Review of Systems: All other 10 point review of systems is negative.   Physical Exam:  vitals were not taken for this visit.   Wt Readings from Last 3 Encounters:  10/02/17 148 lb (67.1 kg)  09/18/17 144 lb (65.3 kg)  09/04/17 144 lb (65.3 kg)    Ocular: Sclerae unicteric, pupils equal, round and reactive to light Ear-nose-throat: Oropharynx clear, dentition fair Lymphatic: No cervical, supraclavicular or axillary adenopathy Lungs no rales or rhonchi, good excursion bilaterally Heart regular rate and rhythm, no murmur appreciated Abd soft, nontender, positive bowel sounds, no liver or spleen tip palpated on exam, no fluid wave  MSK no focal spinal tenderness, no joint edema Neuro: non-focal, well-oriented, appropriate affect Breasts: Deferred   Lab Results  Component Value Date   WBC 4.1 10/16/2017   HGB 10.3 (L) 08/07/2017   HCT 27.2 (L) 10/16/2017   MCV 94.4  10/16/2017   PLT 178 10/16/2017   Lab Results  Component Value Date   FERRITIN 131 08/07/2017   IRON 78 08/07/2017   TIBC 229 08/07/2017   UIBC 151 08/07/2017   IRONPCTSAT 34 08/07/2017   Lab Results  Component Value Date   RBC 2.88 (L) 10/16/2017   Lab Results  Component Value Date   KPAFRELGTCHN 17.5 10/02/2017   LAMBDASER 64.9 (H) 10/02/2017   KAPLAMBRATIO 0.27 10/02/2017   Lab Results  Component Value Date   IGGSERUM 863 10/02/2017   IGA 68 10/02/2017   IGMSERUM 23 10/02/2017   Lab Results   Component Value Date   TOTALPROTELP 6.0 10/02/2017   ALBUMINELP 3.5 10/02/2017   A1GS 0.2 10/02/2017   A2GS 0.7 10/02/2017   BETS 0.8 10/02/2017   BETA2SER 0.3 07/28/2015   GAMS 0.8 10/02/2017   MSPIKE 0.6 (H) 10/02/2017   SPEI * 07/28/2015     Chemistry      Component Value Date/Time   NA 144 10/02/2017 1305   NA 146 (H) 08/07/2017 1153   NA 141 01/09/2017 1004   K 4.4 10/02/2017 1305   K 4.6 08/07/2017 1153   K 4.4 01/09/2017 1004   CL 112 (H) 10/02/2017 1305   CL 108 08/07/2017 1153   CO2 24 10/02/2017 1305   CO2 26 08/07/2017 1153   CO2 22 01/09/2017 1004   BUN 27 (H) 10/02/2017 1305   BUN 24 (H) 08/07/2017 1153   BUN 23.9 01/09/2017 1004   CREATININE 1.40 (H) 10/02/2017 1305   CREATININE 1.5 (H) 08/07/2017 1153   CREATININE 1.5 (H) 01/09/2017 1004      Component Value Date/Time   CALCIUM 9.2 10/02/2017 1305   CALCIUM 8.8 08/07/2017 1153   CALCIUM 8.8 01/09/2017 1004   ALKPHOS 88 (H) 10/02/2017 1305   ALKPHOS 97 (H) 08/07/2017 1153   ALKPHOS 80 01/09/2017 1004   AST 24 10/02/2017 1305   AST 18 01/09/2017 1004   ALT 20 10/02/2017 1305   ALT 20 08/07/2017 1153   ALT 12 01/09/2017 1004   BILITOT 1.1 10/02/2017 1305   BILITOT 0.92 01/09/2017 1004      Impression and Plan: Christian Burns is a very pleasant 84 yo caucasian gentleman with relapsed myeloma. He is tolerating treatment nicely and has no complaints at this time. His M-spike was 0.6 with his last set of lebs. Today's myeloma studies are pending.  Hgb is 9.3 but we will hold off on giving him Aranesp this visit per Dr. Marin Olp.  We will plan to see him back in another months for follow-up and treatment.  Both he and his family know to contact our office with any questions or concerns. We can certainly see him sooner if need be.   Laverna Peace, NP 3/7/20191:35 PM

## 2017-10-16 NOTE — Patient Instructions (Signed)
Cyclophosphamide injection What is this medicine? CYCLOPHOSPHAMIDE (sye kloe FOSS fa mide) is a chemotherapy drug. It slows the growth of cancer cells. This medicine is used to treat many types of cancer like lymphoma, myeloma, leukemia, breast cancer, and ovarian cancer, to name a few. This medicine may be used for other purposes; ask your health care provider or pharmacist if you have questions. COMMON BRAND NAME(S): Cytoxan, Neosar What should I tell my health care provider before I take this medicine? They need to know if you have any of these conditions: -blood disorders -history of other chemotherapy -infection -kidney disease -liver disease -recent or ongoing radiation therapy -tumors in the bone marrow -an unusual or allergic reaction to cyclophosphamide, other chemotherapy, other medicines, foods, dyes, or preservatives -pregnant or trying to get pregnant -breast-feeding How should I use this medicine? This drug is usually given as an injection into a vein or muscle or by infusion into a vein. It is administered in a hospital or clinic by a specially trained health care professional. Talk to your pediatrician regarding the use of this medicine in children. Special care may be needed. Overdosage: If you think you have taken too much of this medicine contact a poison control center or emergency room at once. NOTE: This medicine is only for you. Do not share this medicine with others. What if I miss a dose? It is important not to miss your dose. Call your doctor or health care professional if you are unable to keep an appointment. What may interact with this medicine? This medicine may interact with the following medications: -amiodarone -amphotericin B -azathioprine -certain antiviral medicines for HIV or AIDS such as protease inhibitors (e.g., indinavir, ritonavir) and zidovudine -certain blood pressure medications such as benazepril, captopril, enalapril, fosinopril,  lisinopril, moexipril, monopril, perindopril, quinapril, ramipril, trandolapril -certain cancer medications such as anthracyclines (e.g., daunorubicin, doxorubicin), busulfan, cytarabine, paclitaxel, pentostatin, tamoxifen, trastuzumab -certain diuretics such as chlorothiazide, chlorthalidone, hydrochlorothiazide, indapamide, metolazone -certain medicines that treat or prevent blood clots like warfarin -certain muscle relaxants such as succinylcholine -cyclosporine -etanercept -indomethacin -medicines to increase blood counts like filgrastim, pegfilgrastim, sargramostim -medicines used as general anesthesia -metronidazole -natalizumab This list may not describe all possible interactions. Give your health care provider a list of all the medicines, herbs, non-prescription drugs, or dietary supplements you use. Also tell them if you smoke, drink alcohol, or use illegal drugs. Some items may interact with your medicine. What should I watch for while using this medicine? Visit your doctor for checks on your progress. This drug may make you feel generally unwell. This is not uncommon, as chemotherapy can affect healthy cells as well as cancer cells. Report any side effects. Continue your course of treatment even though you feel ill unless your doctor tells you to stop. Drink water or other fluids as directed. Urinate often, even at night. In some cases, you may be given additional medicines to help with side effects. Follow all directions for their use. Call your doctor or health care professional for advice if you get a fever, chills or sore throat, or other symptoms of a cold or flu. Do not treat yourself. This drug decreases your body's ability to fight infections. Try to avoid being around people who are sick. This medicine may increase your risk to bruise or bleed. Call your doctor or health care professional if you notice any unusual bleeding. Be careful brushing and flossing your teeth or using a  toothpick because you may get an infection or bleed   more easily. If you have any dental work done, tell your dentist you are receiving this medicine. You may get drowsy or dizzy. Do not drive, use machinery, or do anything that needs mental alertness until you know how this medicine affects you. Do not become pregnant while taking this medicine or for 1 year after stopping it. Women should inform their doctor if they wish to become pregnant or think they might be pregnant. Men should not father a child while taking this medicine and for 4 months after stopping it. There is a potential for serious side effects to an unborn child. Talk to your health care professional or pharmacist for more information. Do not breast-feed an infant while taking this medicine. This medicine may interfere with the ability to have a child. This medicine has caused ovarian failure in some women. This medicine has caused reduced sperm counts in some men. You should talk with your doctor or health care professional if you are concerned about your fertility. If you are going to have surgery, tell your doctor or health care professional that you have taken this medicine. What side effects may I notice from receiving this medicine? Side effects that you should report to your doctor or health care professional as soon as possible: -allergic reactions like skin rash, itching or hives, swelling of the face, lips, or tongue -low blood counts - this medicine may decrease the number of white blood cells, red blood cells and platelets. You may be at increased risk for infections and bleeding. -signs of infection - fever or chills, cough, sore throat, pain or difficulty passing urine -signs of decreased platelets or bleeding - bruising, pinpoint red spots on the skin, black, tarry stools, blood in the urine -signs of decreased red blood cells - unusually weak or tired, fainting spells, lightheadedness -breathing problems -dark  urine -dizziness -palpitations -swelling of the ankles, feet, hands -trouble passing urine or change in the amount of urine -weight gain -yellowing of the eyes or skin Side effects that usually do not require medical attention (report to your doctor or health care professional if they continue or are bothersome): -changes in nail or skin color -hair loss -missed menstrual periods -mouth sores -nausea, vomiting This list may not describe all possible side effects. Call your doctor for medical advice about side effects. You may report side effects to FDA at 1-800-FDA-1088. Where should I keep my medicine? This drug is given in a hospital or clinic and will not be stored at home. NOTE: This sheet is a summary. It may not cover all possible information. If you have questions about this medicine, talk to your doctor, pharmacist, or health care provider.  2018 Elsevier/Gold Standard (2012-06-12 16:22:58) Carfilzomib injection What is this medicine? CARFILZOMIB (kar FILZ oh mib) targets a specific protein within cancer cells and stops the cancer cells from growing. It is used to treat multiple myeloma. This medicine may be used for other purposes; ask your health care provider or pharmacist if you have questions. COMMON BRAND NAME(S): KYPROLIS What should I tell my health care provider before I take this medicine? They need to know if you have any of these conditions: -heart disease -history of blood clots -irregular heartbeat -kidney disease -liver disease -lung or breathing disease -an unusual or allergic reaction to carfilzomib, or other medicines, foods, dyes, or preservatives -pregnant or trying to get pregnant -breast-feeding How should I use this medicine? This medicine is for injection or infusion into a vein. It is given  by a health care professional in a hospital or clinic setting. Talk to your pediatrician regarding the use of this medicine in children. Special care may be  needed. Overdosage: If you think you have taken too much of this medicine contact a poison control center or emergency room at once. NOTE: This medicine is only for you. Do not share this medicine with others. What if I miss a dose? It is important not to miss your dose. Call your doctor or health care professional if you are unable to keep an appointment. What may interact with this medicine? Interactions are not expected. Give your health care provider a list of all the medicines, herbs, non-prescription drugs, or dietary supplements you use. Also tell them if you smoke, drink alcohol, or use illegal drugs. Some items may interact with your medicine. This list may not describe all possible interactions. Give your health care provider a list of all the medicines, herbs, non-prescription drugs, or dietary supplements you use. Also tell them if you smoke, drink alcohol, or use illegal drugs. Some items may interact with your medicine. What should I watch for while using this medicine? Your condition will be monitored carefully while you are receiving this medicine. Report any side effects. Continue your course of treatment even though you feel ill unless your doctor tells you to stop. You may need blood work done while you are taking this medicine. Do not become pregnant while taking this medicine or for at least 30 days after stopping it. Women should inform their doctor if they wish to become pregnant or think they might be pregnant. There is a potential for serious side effects to an unborn child. Men should not father a child while taking this medicine and for 90 days after stopping it. Talk to your health care professional or pharmacist for more information. Do not breast-feed an infant while taking this medicine. Check with your doctor or health care professional if you get an attack of severe diarrhea, nausea and vomiting, or if you sweat a lot. The loss of too much body fluid can make it  dangerous for you to take this medicine. You may get dizzy. Do not drive, use machinery, or do anything that needs mental alertness until you know how this medicine affects you. Do not stand or sit up quickly, especially if you are an older patient. This reduces the risk of dizzy or fainting spells. What side effects may I notice from receiving this medicine? Side effects that you should report to your doctor or health care professional as soon as possible: -allergic reactions like skin rash, itching or hives, swelling of the face, lips, or tongue -confusion -dizziness -feeling faint or lightheaded -fever or chills -palpitations -seizures -signs and symptoms of bleeding such as bloody or black, tarry stools; red or dark-brown urine; spitting up blood or brown material that looks like coffee grounds; red spots on the skin; unusual bruising or bleeding including from the eye, gums, or nose -signs and symptoms of a blood clot such as breathing problems; changes in vision; chest pain; severe, sudden headache; pain, swelling, warmth in the leg; trouble speaking; sudden numbness or weakness of the face, arm or leg -signs and symptoms of kidney injury like trouble passing urine or change in the amount of urine -signs and symptoms of liver injury like dark yellow or brown urine; general ill feeling or flu-like symptoms; light-colored stools; loss of appetite; nausea; right upper belly pain; unusually weak or tired; yellowing of the   eyes or skin Side effects that usually do not require medical attention (report to your doctor or health care professional if they continue or are bothersome): -back pain -cough -diarrhea -headache -muscle cramps -vomiting This list may not describe all possible side effects. Call your doctor for medical advice about side effects. You may report side effects to FDA at 1-800-FDA-1088. Where should I keep my medicine? This drug is given in a hospital or clinic and will not  be stored at home. NOTE: This sheet is a summary. It may not cover all possible information. If you have questions about this medicine, talk to your doctor, pharmacist, or health care provider.  2018 Elsevier/Gold Standard (2015-08-31 13:39:23)  

## 2017-10-17 LAB — KAPPA/LAMBDA LIGHT CHAINS
Kappa free light chain: 24.5 mg/L — ABNORMAL HIGH (ref 3.3–19.4)
Kappa, lambda light chain ratio: 0.35 (ref 0.26–1.65)
Lambda free light chains: 70.7 mg/L — ABNORMAL HIGH (ref 5.7–26.3)

## 2017-10-17 LAB — IGG, IGA, IGM
IGG (IMMUNOGLOBIN G), SERUM: 801 mg/dL (ref 700–1600)
IgA: 69 mg/dL (ref 61–437)
IgM (Immunoglobulin M), Srm: 22 mg/dL (ref 15–143)

## 2017-10-20 LAB — PROTEIN ELECTROPHORESIS, SERUM, WITH REFLEX
A/G Ratio: 1.3 (ref 0.7–1.7)
ALPHA-2-GLOBULIN: 0.7 g/dL (ref 0.4–1.0)
Albumin ELP: 3.3 g/dL (ref 2.9–4.4)
Alpha-1-Globulin: 0.2 g/dL (ref 0.0–0.4)
BETA GLOBULIN: 0.8 g/dL (ref 0.7–1.3)
GLOBULIN, TOTAL: 2.5 g/dL (ref 2.2–3.9)
Gamma Globulin: 0.7 g/dL (ref 0.4–1.8)
M-Spike, %: 0.3 g/dL — ABNORMAL HIGH
SPEP INTERP: 0
Total Protein ELP: 5.8 g/dL — ABNORMAL LOW (ref 6.0–8.5)

## 2017-10-30 ENCOUNTER — Inpatient Hospital Stay: Payer: Medicare HMO

## 2017-10-30 ENCOUNTER — Other Ambulatory Visit: Payer: Self-pay

## 2017-10-30 ENCOUNTER — Inpatient Hospital Stay (HOSPITAL_BASED_OUTPATIENT_CLINIC_OR_DEPARTMENT_OTHER): Payer: Medicare HMO | Admitting: Family

## 2017-10-30 ENCOUNTER — Encounter: Payer: Self-pay | Admitting: Family

## 2017-10-30 VITALS — BP 153/72 | HR 72 | Temp 97.8°F | Resp 16 | Wt 148.0 lb

## 2017-10-30 DIAGNOSIS — Z923 Personal history of irradiation: Secondary | ICD-10-CM | POA: Diagnosis not present

## 2017-10-30 DIAGNOSIS — D508 Other iron deficiency anemias: Secondary | ICD-10-CM | POA: Diagnosis not present

## 2017-10-30 DIAGNOSIS — C9002 Multiple myeloma in relapse: Secondary | ICD-10-CM

## 2017-10-30 DIAGNOSIS — G620 Drug-induced polyneuropathy: Secondary | ICD-10-CM

## 2017-10-30 DIAGNOSIS — Z79899 Other long term (current) drug therapy: Secondary | ICD-10-CM | POA: Diagnosis not present

## 2017-10-30 DIAGNOSIS — R0602 Shortness of breath: Secondary | ICD-10-CM

## 2017-10-30 DIAGNOSIS — Z5112 Encounter for antineoplastic immunotherapy: Secondary | ICD-10-CM | POA: Diagnosis not present

## 2017-10-30 DIAGNOSIS — C9000 Multiple myeloma not having achieved remission: Secondary | ICD-10-CM

## 2017-10-30 LAB — CBC WITH DIFFERENTIAL (CANCER CENTER ONLY)
BASOS PCT: 1 %
Basophils Absolute: 0.1 10*3/uL (ref 0.0–0.1)
EOS ABS: 0.4 10*3/uL (ref 0.0–0.5)
Eosinophils Relative: 7 %
HEMATOCRIT: 26.5 % — AB (ref 38.7–49.9)
HEMOGLOBIN: 9 g/dL — AB (ref 13.0–17.1)
LYMPHS ABS: 0.4 10*3/uL — AB (ref 0.9–3.3)
Lymphocytes Relative: 8 %
MCH: 32.4 pg (ref 28.0–33.4)
MCHC: 34 g/dL (ref 32.0–35.9)
MCV: 95.3 fL (ref 82.0–98.0)
MONOS PCT: 15 %
Monocytes Absolute: 0.8 10*3/uL (ref 0.1–0.9)
NEUTROS PCT: 69 %
Neutro Abs: 3.6 10*3/uL (ref 1.5–6.5)
Platelet Count: 184 10*3/uL (ref 145–400)
RBC: 2.78 MIL/uL — AB (ref 4.20–5.70)
RDW: 14.3 % (ref 11.1–15.7)
WBC: 5.2 10*3/uL (ref 4.0–10.0)

## 2017-10-30 LAB — CMP (CANCER CENTER ONLY)
ALBUMIN: 3 g/dL — AB (ref 3.5–5.0)
ALT: 17 U/L (ref 10–47)
ANION GAP: 8 (ref 5–15)
AST: 27 U/L (ref 11–38)
Alkaline Phosphatase: 87 U/L — ABNORMAL HIGH (ref 26–84)
BILIRUBIN TOTAL: 1 mg/dL (ref 0.2–1.6)
BUN: 25 mg/dL — ABNORMAL HIGH (ref 7–22)
CHLORIDE: 111 mmol/L — AB (ref 98–108)
CO2: 27 mmol/L (ref 18–33)
CREATININE: 1.4 mg/dL — AB (ref 0.60–1.20)
Calcium: 8.8 mg/dL (ref 8.0–10.3)
Glucose, Bld: 118 mg/dL (ref 73–118)
Potassium: 4.1 mmol/L (ref 3.3–4.7)
Sodium: 146 mmol/L — ABNORMAL HIGH (ref 128–145)
Total Protein: 6 g/dL — ABNORMAL LOW (ref 6.4–8.1)

## 2017-10-30 MED ORDER — SODIUM CHLORIDE 0.9 % IV SOLN: Freq: Once | INTRAVENOUS | Status: AC

## 2017-10-30 MED ORDER — DEXAMETHASONE SODIUM PHOSPHATE 10 MG/ML IJ SOLN
10.0000 mg | Freq: Once | INTRAMUSCULAR | Status: AC
  Administered 2017-10-30: 10 mg via INTRAVENOUS

## 2017-10-30 MED ORDER — DARBEPOETIN ALFA 300 MCG/0.6ML IJ SOSY
PREFILLED_SYRINGE | INTRAMUSCULAR | Status: AC
  Filled 2017-10-30: qty 0.6

## 2017-10-30 MED ORDER — DARBEPOETIN ALFA 300 MCG/0.6ML IJ SOSY
300.0000 ug | PREFILLED_SYRINGE | INTRAMUSCULAR | Status: DC
  Administered 2017-10-30: 300 ug via SUBCUTANEOUS

## 2017-10-30 MED ORDER — SODIUM CHLORIDE 0.9 % IV SOLN
Freq: Once | INTRAVENOUS | Status: AC
  Administered 2017-10-30: 14:00:00 via INTRAVENOUS

## 2017-10-30 MED ORDER — PALONOSETRON HCL INJECTION 0.25 MG/5ML
0.2500 mg | Freq: Once | INTRAVENOUS | Status: AC
  Administered 2017-10-30: 0.25 mg via INTRAVENOUS

## 2017-10-30 MED ORDER — DEXAMETHASONE SODIUM PHOSPHATE 10 MG/ML IJ SOLN
INTRAMUSCULAR | Status: AC
  Filled 2017-10-30: qty 1

## 2017-10-30 MED ORDER — PALONOSETRON HCL INJECTION 0.25 MG/5ML
INTRAVENOUS | Status: AC
  Filled 2017-10-30: qty 5

## 2017-10-30 MED ORDER — DEXTROSE 5 % IV SOLN
60.0000 mg | Freq: Once | INTRAVENOUS | Status: AC
  Administered 2017-10-30: 60 mg via INTRAVENOUS
  Filled 2017-10-30: qty 30

## 2017-10-30 MED ORDER — SODIUM CHLORIDE 0.9 % IV SOLN
300.0000 mg/m2 | Freq: Once | INTRAVENOUS | Status: AC
  Administered 2017-10-30: 540 mg via INTRAVENOUS
  Filled 2017-10-30: qty 27

## 2017-10-30 NOTE — Progress Notes (Signed)
Hematology and Oncology Follow Up Visit  Christian Burns [id number] 1930-03-14 84 y.o. 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 He is doing well but has some occasional fatigue. He will take a nap as needed. His Hgb today is 9.0, MCV 95.  M-spike earlier this month was 0.3, IgG 801 mg/dL and lambda light chain 2.45 mg/dL.  He has occasional SOB with over exertion. He has chills, this is unchanged.  No fever, n/v, cough, rash, dizziness, chest pain, palpitations, abdominal pain or changes in bowel or bladder habits.  Numbness and tingling in his fingertips and feet is unchanged. No effect on dexterity or gait at this time. No falls or syncopal episodes.  He has maintained a good appetite and is staying well hydrated. His weight is stable.  No lymphadenopathy found on exam.   ECOG Performance Status: 1 - Symptomatic but completely ambulatory  Medications:  Allergies as of 10/30/2017      Reactions   Sulfa Antibiotics Itching   Sulfasalazine Itching      Medication List        Accurate as of 10/30/17  1:29 PM. Always use your most recent med list.          amLODipine-benazepril 5-10 MG capsule Commonly known as:  LOTREL TAKE 1 CAPSULE BY MOUTH DAILY   amLODipine-benazepril 5-10 MG capsule Commonly known as:  LOTREL TAKE 1 CAPSULE BY MOUTH DAILY   MULTIVITAL PO Take 1 tablet by mouth every morning.   pyridOXINE 100 MG  tablet Commonly known as:  VITAMIN B-6 Take 100 mg by mouth daily.   VITAMIN B 12 PO Take 1,000 mcg by mouth every morning.   Vitamin D (Ergocalciferol) 50000 units Caps capsule Commonly known as:  DRISDOL Take 50,000 Units by mouth every 7 (seven) days. Sundays       Allergies:  Allergies  Allergen Reactions  . Sulfa Antibiotics Itching  . Sulfasalazine Itching    Past Medical History, Surgical history, Social history, and Family History were reviewed and updated.  Review of Systems: All other 10 point review of systems is negative.   Physical Exam:  vitals were not taken for this visit.   Wt Readings from Last 3 Encounters:  10/16/17 145 lb (65.8 kg)  10/02/17 148 lb (67.1 kg)  09/18/17 144 lb (65.3 kg)    Ocular: Sclerae unicteric, pupils equal, round and reactive to light Ear-nose-throat: Oropharynx clear, dentition fair Lymphatic: No cervical, supraclavicular or axillary adenopathy Lungs no rales or rhonchi, good excursion bilaterally Heart regular rate and rhythm, no murmur appreciated Abd soft, nontender, positive bowel sounds, no liver or spleen tip palpated on exam, no fluid wave  MSK no focal spinal tenderness, no joint edema Neuro: non-focal, well-oriented, appropriate affect Breasts: Deferred   Lab Results  Component Value Date   WBC 4.1 10/16/2017   HGB 10.3 (L) 08/07/2017   HCT 27.2 (L) 10/16/2017   MCV 94.4 10/16/2017   PLT 178  10/16/2017   Lab Results  Component Value Date   FERRITIN 131 08/07/2017   IRON 78 08/07/2017   TIBC 229 08/07/2017   UIBC 151 08/07/2017   IRONPCTSAT 34 08/07/2017   Lab Results  Component Value Date   RBC 2.88 (L) 10/16/2017   Lab Results  Component Value Date   KPAFRELGTCHN 24.5 (H) 10/16/2017   LAMBDASER 70.7 (H) 10/16/2017   KAPLAMBRATIO 0.35 10/16/2017   Lab Results  Component Value Date   IGGSERUM 801 10/16/2017   IGA 69 10/16/2017   IGMSERUM 22 10/16/2017   Lab Results  Component Value Date    TOTALPROTELP 5.8 (L) 10/16/2017   ALBUMINELP 3.3 10/16/2017   A1GS 0.2 10/16/2017   A2GS 0.7 10/16/2017   BETS 0.8 10/16/2017   BETA2SER 0.3 07/28/2015   GAMS 0.7 10/16/2017   MSPIKE 0.3 (H) 10/16/2017   SPEI * 07/28/2015     Chemistry      Component Value Date/Time   NA 143 10/16/2017 1320   NA 146 (H) 08/07/2017 1153   NA 141 01/09/2017 1004   K 4.2 10/16/2017 1320   K 4.6 08/07/2017 1153   K 4.4 01/09/2017 1004   CL 112 (H) 10/16/2017 1320   CL 108 08/07/2017 1153   CO2 26 10/16/2017 1320   CO2 26 08/07/2017 1153   CO2 22 01/09/2017 1004   BUN 22 10/16/2017 1320   BUN 24 (H) 08/07/2017 1153   BUN 23.9 01/09/2017 1004   CREATININE 1.20 10/16/2017 1320   CREATININE 1.5 (H) 08/07/2017 1153   CREATININE 1.5 (H) 01/09/2017 1004      Component Value Date/Time   CALCIUM 8.9 10/16/2017 1320   CALCIUM 8.8 08/07/2017 1153   CALCIUM 8.8 01/09/2017 1004   ALKPHOS 90 (H) 10/16/2017 1320   ALKPHOS 97 (H) 08/07/2017 1153   ALKPHOS 80 01/09/2017 1004   AST 28 10/16/2017 1320   AST 18 01/09/2017 1004   ALT 23 10/16/2017 1320   ALT 20 08/07/2017 1153   ALT 12 01/09/2017 1004   BILITOT 1.0 10/16/2017 1320   BILITOT 0.92 01/09/2017 1004      Impression and Plan: Christian Burns is a very pleasant 84 yo caucasian gentleman with relapsed IgG lambda myeloma. He continues to do well with treatment. 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 This is stable.  M-spike was down to 0.3 earlier this month.  We will proceed with treatment today as planned.  Hgb is 9.0 so he will also get Aranesp today.  We will plan to see him back in another 2 weeks for follow-up and treatment.  Both he and his family know to contact our office with any questions or concerns. We can certainly see him sooner if need be.    Laverna Peace, NP 3/21/20191:29 PM

## 2017-10-30 NOTE — Patient Instructions (Signed)
Carfilzomib injection What is this medicine? CARFILZOMIB (kar FILZ oh mib) targets a specific protein within cancer cells and stops the cancer cells from growing. It is used to treat multiple myeloma. This medicine may be used for other purposes; ask your health care provider or pharmacist if you have questions. COMMON BRAND NAME(S): KYPROLIS What should I tell my health care provider before I take this medicine? They need to know if you have any of these conditions: -heart disease -history of blood clots -irregular heartbeat -kidney disease -liver disease -lung or breathing disease -an unusual or allergic reaction to carfilzomib, or other medicines, foods, dyes, or preservatives -pregnant or trying to get pregnant -breast-feeding How should I use this medicine? This medicine is for injection or infusion into a vein. It is given by a health care professional in a hospital or clinic setting. Talk to your pediatrician regarding the use of this medicine in children. Special care may be needed. Overdosage: If you think you have taken too much of this medicine contact a poison control center or emergency room at once. NOTE: This medicine is only for you. Do not share this medicine with others. What if I miss a dose? It is important not to miss your dose. Call your doctor or health care professional if you are unable to keep an appointment. What may interact with this medicine? Interactions are not expected. Give your health care provider a list of all the medicines, herbs, non-prescription drugs, or dietary supplements you use. Also tell them if you smoke, drink alcohol, or use illegal drugs. Some items may interact with your medicine. This list may not describe all possible interactions. Give your health care provider a list of all the medicines, herbs, non-prescription drugs, or dietary supplements you use. Also tell them if you smoke, drink alcohol, or use illegal drugs. Some items may  interact with your medicine. What should I watch for while using this medicine? Your condition will be monitored carefully while you are receiving this medicine. Report any side effects. Continue your course of treatment even though you feel ill unless your doctor tells you to stop. You may need blood work done while you are taking this medicine. Do not become pregnant while taking this medicine or for at least 30 days after stopping it. Women should inform their doctor if they wish to become pregnant or think they might be pregnant. There is a potential for serious side effects to an unborn child. Men should not father a child while taking this medicine and for 90 days after stopping it. Talk to your health care professional or pharmacist for more information. Do not breast-feed an infant while taking this medicine. Check with your doctor or health care professional if you get an attack of severe diarrhea, nausea and vomiting, or if you sweat a lot. The loss of too much body fluid can make it dangerous for you to take this medicine. You may get dizzy. Do not drive, use machinery, or do anything that needs mental alertness until you know how this medicine affects you. Do not stand or sit up quickly, especially if you are an older patient. This reduces the risk of dizzy or fainting spells. What side effects may I notice from receiving this medicine? Side effects that you should report to your doctor or health care professional as soon as possible: -allergic reactions like skin rash, itching or hives, swelling of the face, lips, or tongue -confusion -dizziness -feeling faint or lightheaded -fever or chills -  palpitations -seizures -signs and symptoms of bleeding such as bloody or black, tarry stools; red or dark-brown urine; spitting up blood or brown material that looks like coffee grounds; red spots on the skin; unusual bruising or bleeding including from the eye, gums, or nose -signs and symptoms of  a blood clot such as breathing problems; changes in vision; chest pain; severe, sudden headache; pain, swelling, warmth in the leg; trouble speaking; sudden numbness or weakness of the face, arm or leg -signs and symptoms of kidney injury like trouble passing urine or change in the amount of urine -signs and symptoms of liver injury like dark yellow or brown urine; general ill feeling or flu-like symptoms; light-colored stools; loss of appetite; nausea; right upper belly pain; unusually weak or tired; yellowing of the eyes or skin Side effects that usually do not require medical attention (report to your doctor or health care professional if they continue or are bothersome): -back pain -cough -diarrhea -headache -muscle cramps -vomiting This list may not describe all possible side effects. Call your doctor for medical advice about side effects. You may report side effects to FDA at 1-800-FDA-1088. Where should I keep my medicine? This drug is given in a hospital or clinic and will not be stored at home. NOTE: This sheet is a summary. It may not cover all possible information. If you have questions about this medicine, talk to your doctor, pharmacist, or health care provider.  2018 Elsevier/Gold Standard (2015-08-31 13:39:23)  Cyclophosphamide injection What is this medicine? CYCLOPHOSPHAMIDE (sye kloe FOSS fa mide) is a chemotherapy drug. It slows the growth of cancer cells. This medicine is used to treat many types of cancer like lymphoma, myeloma, leukemia, breast cancer, and ovarian cancer, to name a few. This medicine may be used for other purposes; ask your health care provider or pharmacist if you have questions. COMMON BRAND NAME(S): Cytoxan, Neosar What should I tell my health care provider before I take this medicine? They need to know if you have any of these conditions: -blood disorders -history of other chemotherapy -infection -kidney disease -liver disease -recent or ongoing  radiation therapy -tumors in the bone marrow -an unusual or allergic reaction to cyclophosphamide, other chemotherapy, other medicines, foods, dyes, or preservatives -pregnant or trying to get pregnant -breast-feeding How should I use this medicine? This drug is usually given as an injection into a vein or muscle or by infusion into a vein. It is administered in a hospital or clinic by a specially trained health care professional. Talk to your pediatrician regarding the use of this medicine in children. Special care may be needed. Overdosage: If you think you have taken too much of this medicine contact a poison control center or emergency room at once. NOTE: This medicine is only for you. Do not share this medicine with others. What if I miss a dose? It is important not to miss your dose. Call your doctor or health care professional if you are unable to keep an appointment. What may interact with this medicine? This medicine may interact with the following medications: -amiodarone -amphotericin B -azathioprine -certain antiviral medicines for HIV or AIDS such as protease inhibitors (e.g., indinavir, ritonavir) and zidovudine -certain blood pressure medications such as benazepril, captopril, enalapril, fosinopril, lisinopril, moexipril, monopril, perindopril, quinapril, ramipril, trandolapril -certain cancer medications such as anthracyclines (e.g., daunorubicin, doxorubicin), busulfan, cytarabine, paclitaxel, pentostatin, tamoxifen, trastuzumab -certain diuretics such as chlorothiazide, chlorthalidone, hydrochlorothiazide, indapamide, metolazone -certain medicines that treat or prevent blood clots like warfarin -certain   muscle relaxants such as succinylcholine -cyclosporine -etanercept -indomethacin -medicines to increase blood counts like filgrastim, pegfilgrastim, sargramostim -medicines used as general anesthesia -metronidazole -natalizumab This list may not describe all possible  interactions. Give your health care provider a list of all the medicines, herbs, non-prescription drugs, or dietary supplements you use. Also tell them if you smoke, drink alcohol, or use illegal drugs. Some items may interact with your medicine. What should I watch for while using this medicine? Visit your doctor for checks on your progress. This drug may make you feel generally unwell. This is not uncommon, as chemotherapy can affect healthy cells as well as cancer cells. Report any side effects. Continue your course of treatment even though you feel ill unless your doctor tells you to stop. Drink water or other fluids as directed. Urinate often, even at night. In some cases, you may be given additional medicines to help with side effects. Follow all directions for their use. Call your doctor or health care professional for advice if you get a fever, chills or sore throat, or other symptoms of a cold or flu. Do not treat yourself. This drug decreases your body's ability to fight infections. Try to avoid being around people who are sick. This medicine may increase your risk to bruise or bleed. Call your doctor or health care professional if you notice any unusual bleeding. Be careful brushing and flossing your teeth or using a toothpick because you may get an infection or bleed more easily. If you have any dental work done, tell your dentist you are receiving this medicine. You may get drowsy or dizzy. Do not drive, use machinery, or do anything that needs mental alertness until you know how this medicine affects you. Do not become pregnant while taking this medicine or for 1 year after stopping it. Women should inform their doctor if they wish to become pregnant or think they might be pregnant. Men should not father a child while taking this medicine and for 4 months after stopping it. There is a potential for serious side effects to an unborn child. Talk to your health care professional or pharmacist for  more information. Do not breast-feed an infant while taking this medicine. This medicine may interfere with the ability to have a child. This medicine has caused ovarian failure in some women. This medicine has caused reduced sperm counts in some men. You should talk with your doctor or health care professional if you are concerned about your fertility. If you are going to have surgery, tell your doctor or health care professional that you have taken this medicine. What side effects may I notice from receiving this medicine? Side effects that you should report to your doctor or health care professional as soon as possible: -allergic reactions like skin rash, itching or hives, swelling of the face, lips, or tongue -low blood counts - this medicine may decrease the number of white blood cells, red blood cells and platelets. You may be at increased risk for infections and bleeding. -signs of infection - fever or chills, cough, sore throat, pain or difficulty passing urine -signs of decreased platelets or bleeding - bruising, pinpoint red spots on the skin, black, tarry stools, blood in the urine -signs of decreased red blood cells - unusually weak or tired, fainting spells, lightheadedness -breathing problems -dark urine -dizziness -palpitations -swelling of the ankles, feet, hands -trouble passing urine or change in the amount of urine -weight gain -yellowing of the eyes or skin Side effects that   do not require medical attention (report to your doctor or health care professional if they continue or are bothersome): -changes in nail or skin color -hair loss -missed menstrual periods -mouth sores -nausea, vomiting This list may not describe all possible side effects. Call your doctor for medical advice about side effects. You may report side effects to FDA at 1-800-FDA-1088. Where should I keep my medicine? This drug is given in a hospital or clinic and will not be stored at  home. NOTE: This sheet is a summary. It may not cover all possible information. If you have questions about this medicine, talk to your doctor, pharmacist, or health care provider.  2018 Elsevier/Gold Standard (2012-06-12 16:22:58) Darbepoetin Alfa injection What is this medicine? DARBEPOETIN ALFA (dar be POE e tin AL fa) helps your body make more red blood cells. It is used to treat anemia caused by chronic kidney failure and chemotherapy. This medicine may be used for other purposes; ask your health care provider or pharmacist if you have questions. COMMON BRAND NAME(S): Aranesp What should I tell my health care provider before I take this medicine? They need to know if you have any of these conditions: -blood clotting disorders or history of blood clots -cancer patient not on chemotherapy -cystic fibrosis -heart disease, such as angina, heart failure, or a history of a heart attack -hemoglobin level of 12 g/dL or greater -high blood pressure -low levels of folate, iron, or vitamin B12 -seizures -an unusual or allergic reaction to darbepoetin, erythropoietin, albumin, hamster proteins, latex, other medicines, foods, dyes, or preservatives -pregnant or trying to get pregnant -breast-feeding How should I use this medicine? This medicine is for injection into a vein or under the skin. It is usually given by a health care professional in a hospital or clinic setting. If you get this medicine at home, you will be taught how to prepare and give this medicine. Use exactly as directed. Take your medicine at regular intervals. Do not take your medicine more often than directed. It is important that you put your used needles and syringes in a special sharps container. Do not put them in a trash can. If you do not have a sharps container, call your pharmacist or healthcare provider to get one. A special MedGuide will be given to you by the pharmacist with each prescription and refill. Be sure to  read this information carefully each time. Talk to your pediatrician regarding the use of this medicine in children. While this medicine may be used in children as young as 1 year for selected conditions, precautions do apply. Overdosage: If you think you have taken too much of this medicine contact a poison control center or emergency room at once. NOTE: This medicine is only for you. Do not share this medicine with others. What if I miss a dose? If you miss a dose, take it as soon as you can. If it is almost time for your next dose, take only that dose. Do not take double or extra doses. What may interact with this medicine? Do not take this medicine with any of the following medications: -epoetin alfa This list may not describe all possible interactions. Give your health care provider a list of all the medicines, herbs, non-prescription drugs, or dietary supplements you use. Also tell them if you smoke, drink alcohol, or use illegal drugs. Some items may interact with your medicine. What should I watch for while using this medicine? Your condition will be monitored carefully while you  are receiving this medicine. You may need blood work done while you are taking this medicine. What side effects may I notice from receiving this medicine? Side effects that you should report to your doctor or health care professional as soon as possible: -allergic reactions like skin rash, itching or hives, swelling of the face, lips, or tongue -breathing problems -changes in vision -chest pain -confusion, trouble speaking or understanding -feeling faint or lightheaded, falls -high blood pressure -muscle aches or pains -pain, swelling, warmth in the leg -rapid weight gain -severe headaches -sudden numbness or weakness of the face, arm or leg -trouble walking, dizziness, loss of balance or coordination -seizures (convulsions) -swelling of the ankles, feet, hands -unusually weak or tired Side effects  that usually do not require medical attention (report to your doctor or health care professional if they continue or are bothersome): -diarrhea -fever, chills (flu-like symptoms) -headaches -nausea, vomiting -redness, stinging, or swelling at site where injected This list may not describe all possible side effects. Call your doctor for medical advice about side effects. You may report side effects to FDA at 1-800-FDA-1088. Where should I keep my medicine? Keep out of the reach of children. Store in a refrigerator between 2 and 8 degrees C (36 and 46 degrees F). Do not freeze. Do not shake. Throw away any unused portion if using a single-dose vial. Throw away any unused medicine after the expiration date. NOTE: This sheet is a summary. It may not cover all possible information. If you have questions about this medicine, talk to your doctor, pharmacist, or health care provider.  2018 Elsevier/Gold Standard (2016-03-18 19:52:26)

## 2017-10-31 LAB — KAPPA/LAMBDA LIGHT CHAINS
KAPPA, LAMDA LIGHT CHAIN RATIO: 0.3 (ref 0.26–1.65)
Kappa free light chain: 21.6 mg/L — ABNORMAL HIGH (ref 3.3–19.4)
LAMDA FREE LIGHT CHAINS: 72.5 mg/L — AB (ref 5.7–26.3)

## 2017-10-31 LAB — IGG, IGA, IGM
IGM (IMMUNOGLOBULIN M), SRM: 21 mg/dL (ref 15–143)
IgA: 65 mg/dL (ref 61–437)
IgG (Immunoglobin G), Serum: 769 mg/dL (ref 700–1600)

## 2017-11-03 ENCOUNTER — Other Ambulatory Visit: Payer: Self-pay | Admitting: Hematology & Oncology

## 2017-11-03 DIAGNOSIS — I10 Essential (primary) hypertension: Secondary | ICD-10-CM

## 2017-11-03 LAB — PROTEIN ELECTROPHORESIS, SERUM
A/G RATIO SPE: 1.2 (ref 0.7–1.7)
ALBUMIN ELP: 3.1 g/dL (ref 2.9–4.4)
Alpha-1-Globulin: 0.2 g/dL (ref 0.0–0.4)
Alpha-2-Globulin: 0.7 g/dL (ref 0.4–1.0)
Beta Globulin: 0.8 g/dL (ref 0.7–1.3)
Gamma Globulin: 0.8 g/dL (ref 0.4–1.8)
Globulin, Total: 2.5 g/dL (ref 2.2–3.9)
M-Spike, %: 0.5 g/dL — ABNORMAL HIGH
TOTAL PROTEIN ELP: 5.6 g/dL — AB (ref 6.0–8.5)

## 2017-11-06 DIAGNOSIS — L814 Other melanin hyperpigmentation: Secondary | ICD-10-CM | POA: Diagnosis not present

## 2017-11-06 DIAGNOSIS — N644 Mastodynia: Secondary | ICD-10-CM | POA: Diagnosis not present

## 2017-11-06 DIAGNOSIS — Z85828 Personal history of other malignant neoplasm of skin: Secondary | ICD-10-CM | POA: Diagnosis not present

## 2017-11-06 DIAGNOSIS — D225 Melanocytic nevi of trunk: Secondary | ICD-10-CM | POA: Diagnosis not present

## 2017-11-06 DIAGNOSIS — C44319 Basal cell carcinoma of skin of other parts of face: Secondary | ICD-10-CM | POA: Diagnosis not present

## 2017-11-06 DIAGNOSIS — L570 Actinic keratosis: Secondary | ICD-10-CM | POA: Diagnosis not present

## 2017-11-06 DIAGNOSIS — D1800 Hemangioma unspecified site: Secondary | ICD-10-CM | POA: Diagnosis not present

## 2017-11-06 DIAGNOSIS — L821 Other seborrheic keratosis: Secondary | ICD-10-CM | POA: Diagnosis not present

## 2017-11-06 DIAGNOSIS — D485 Neoplasm of uncertain behavior of skin: Secondary | ICD-10-CM | POA: Diagnosis not present

## 2017-11-06 DIAGNOSIS — Z23 Encounter for immunization: Secondary | ICD-10-CM | POA: Diagnosis not present

## 2017-11-13 ENCOUNTER — Inpatient Hospital Stay: Payer: Medicare HMO | Attending: Hematology & Oncology | Admitting: Family

## 2017-11-13 ENCOUNTER — Inpatient Hospital Stay: Payer: Medicare HMO

## 2017-11-13 ENCOUNTER — Other Ambulatory Visit: Payer: Self-pay

## 2017-11-13 ENCOUNTER — Encounter: Payer: Self-pay | Admitting: Family

## 2017-11-13 VITALS — BP 146/71 | HR 75 | Temp 97.7°F | Resp 19 | Wt 145.0 lb

## 2017-11-13 DIAGNOSIS — Z5111 Encounter for antineoplastic chemotherapy: Secondary | ICD-10-CM | POA: Insufficient documentation

## 2017-11-13 DIAGNOSIS — Z923 Personal history of irradiation: Secondary | ICD-10-CM | POA: Insufficient documentation

## 2017-11-13 DIAGNOSIS — C9002 Multiple myeloma in relapse: Secondary | ICD-10-CM | POA: Diagnosis not present

## 2017-11-13 DIAGNOSIS — Z79899 Other long term (current) drug therapy: Secondary | ICD-10-CM | POA: Insufficient documentation

## 2017-11-13 DIAGNOSIS — D508 Other iron deficiency anemias: Secondary | ICD-10-CM | POA: Insufficient documentation

## 2017-11-13 DIAGNOSIS — Z5112 Encounter for antineoplastic immunotherapy: Secondary | ICD-10-CM | POA: Insufficient documentation

## 2017-11-13 DIAGNOSIS — G620 Drug-induced polyneuropathy: Secondary | ICD-10-CM | POA: Diagnosis not present

## 2017-11-13 DIAGNOSIS — C9000 Multiple myeloma not having achieved remission: Secondary | ICD-10-CM

## 2017-11-13 LAB — CBC WITH DIFFERENTIAL (CANCER CENTER ONLY)
BASOS ABS: 0.1 10*3/uL (ref 0.0–0.1)
Basophils Relative: 1 %
EOS ABS: 0.4 10*3/uL (ref 0.0–0.5)
EOS PCT: 8 %
HCT: 33.1 % — ABNORMAL LOW (ref 38.7–49.9)
Hemoglobin: 11.1 g/dL — ABNORMAL LOW (ref 13.0–17.1)
Lymphocytes Relative: 8 %
Lymphs Abs: 0.4 10*3/uL — ABNORMAL LOW (ref 0.9–3.3)
MCH: 32.4 pg (ref 28.0–33.4)
MCHC: 33.5 g/dL (ref 32.0–35.9)
MCV: 96.5 fL (ref 82.0–98.0)
Monocytes Absolute: 0.7 10*3/uL (ref 0.1–0.9)
Monocytes Relative: 14 %
Neutro Abs: 3.3 10*3/uL (ref 1.5–6.5)
Neutrophils Relative %: 69 %
PLATELETS: 187 10*3/uL (ref 145–400)
RBC: 3.43 MIL/uL — AB (ref 4.20–5.70)
RDW: 15.3 % (ref 11.1–15.7)
WBC: 4.8 10*3/uL (ref 4.0–10.0)

## 2017-11-13 LAB — CMP (CANCER CENTER ONLY)
ALT: 19 U/L (ref 10–47)
AST: 27 U/L (ref 11–38)
Albumin: 3.2 g/dL — ABNORMAL LOW (ref 3.5–5.0)
Alkaline Phosphatase: 86 U/L — ABNORMAL HIGH (ref 26–84)
Anion gap: 8 (ref 5–15)
BUN: 28 mg/dL — ABNORMAL HIGH (ref 7–22)
CO2: 24 mmol/L (ref 18–33)
Calcium: 8.9 mg/dL (ref 8.0–10.3)
Chloride: 110 mmol/L — ABNORMAL HIGH (ref 98–108)
Creatinine: 1.3 mg/dL — ABNORMAL HIGH (ref 0.60–1.20)
GLUCOSE: 130 mg/dL — AB (ref 73–118)
POTASSIUM: 4.3 mmol/L (ref 3.3–4.7)
SODIUM: 142 mmol/L (ref 128–145)
TOTAL PROTEIN: 6.4 g/dL (ref 6.4–8.1)
Total Bilirubin: 1.2 mg/dL (ref 0.2–1.6)

## 2017-11-13 MED ORDER — PALONOSETRON HCL INJECTION 0.25 MG/5ML
0.2500 mg | Freq: Once | INTRAVENOUS | Status: AC
  Administered 2017-11-13: 0.25 mg via INTRAVENOUS

## 2017-11-13 MED ORDER — DEXAMETHASONE SODIUM PHOSPHATE 10 MG/ML IJ SOLN
10.0000 mg | Freq: Once | INTRAMUSCULAR | Status: AC
  Administered 2017-11-13: 10 mg via INTRAVENOUS

## 2017-11-13 MED ORDER — SODIUM CHLORIDE 0.9 % IV SOLN
300.0000 mg/m2 | Freq: Once | INTRAVENOUS | Status: AC
  Administered 2017-11-13: 540 mg via INTRAVENOUS
  Filled 2017-11-13: qty 27

## 2017-11-13 MED ORDER — DEXAMETHASONE SODIUM PHOSPHATE 10 MG/ML IJ SOLN
INTRAMUSCULAR | Status: AC
  Filled 2017-11-13: qty 1

## 2017-11-13 MED ORDER — SODIUM CHLORIDE 0.9 % IV SOLN
Freq: Once | INTRAVENOUS | Status: AC
  Administered 2017-11-13: 15:00:00 via INTRAVENOUS

## 2017-11-13 MED ORDER — DEXTROSE 5 % IV SOLN
60.0000 mg | Freq: Once | INTRAVENOUS | Status: AC
  Administered 2017-11-13: 60 mg via INTRAVENOUS
  Filled 2017-11-13: qty 30

## 2017-11-13 MED ORDER — PALONOSETRON HCL INJECTION 0.25 MG/5ML
INTRAVENOUS | Status: AC
  Filled 2017-11-13: qty 5

## 2017-11-13 MED ORDER — SODIUM CHLORIDE 0.9 % IV SOLN: Freq: Once | INTRAVENOUS | Status: AC

## 2017-11-13 NOTE — Progress Notes (Signed)
Hematology and Oncology Follow Up Visit  Christian Burns [id number] 1930/05/24 84 y.o. 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 He is doing well and has been to see his dermatologist. He has several spots removed from his cheeks and forehead and will need to have a Moh's procedure done on the left cheek. I spoke with Dr. Marin Olp and he feels it would be fine for him to have this procedure.  In March, his M-spike was up slightly at 0.5 g/dL, IgG 769 mg/dL and lambda light chain 7.25 mg/dL. Hgb is stable at 11.1, WBC count 4.8 and platelet count 187.  No fever, chills, n/v, cough, rash, dizziness, SOB, chest pain, palpitations, abdominal pain or changes in bowel or bladder habits.  He has had some loose stools but states that he has not needed to take his imodium yet.  The numbness and tingling in his hands is unchanged. He feels that the neuropathy in his feet is a little better.  No falls or syncopal episodes.  No swelling or tenderness in her extremities. No c/o pain.  He has maintained a good appetite and is staying well hydrated. His weight is stable.   ECOG Performance Status: 1 - Symptomatic but completely ambulatory  Medications:  Allergies as of 11/13/2017      Reactions   Sulfa Antibiotics Itching   Sulfasalazine Itching      Medication List        Accurate as of 11/13/17  1:58 PM. Always use your most recent med list.            amLODipine-benazepril 5-10 MG capsule Commonly known as:  LOTREL TAKE 1 CAPSULE BY MOUTH DAILY   amLODipine-benazepril 5-10 MG capsule Commonly known as:  LOTREL TAKE 1 CAPSULE BY MOUTH DAILY   MULTIVITAL PO Take 1 tablet by mouth every morning.   pyridOXINE 100 MG tablet Commonly known as:  VITAMIN B-6 Take 100 mg by mouth daily.   VITAMIN B 12 PO Take 1,000 mcg by mouth every morning.   Vitamin D (Ergocalciferol) 50000 units Caps capsule Commonly known as:  DRISDOL Take 50,000 Units by mouth every 7 (seven) days. Sundays       Allergies:  Allergies  Allergen Reactions  . Sulfa Antibiotics Itching  . Sulfasalazine Itching    Past Medical History, Surgical history, Social history, and Family History were reviewed and updated.  Review of Systems: All other 10 point review of systems is negative.   Physical Exam:  vitals were not taken for this visit.   Wt Readings from Last 3 Encounters:  10/30/17 148 lb (67.1 kg)  10/16/17 145 lb (65.8 kg)  10/02/17 148 lb (67.1 kg)    Ocular: Sclerae unicteric, pupils equal, round and reactive to light Ear-nose-throat: Oropharynx clear, dentition fair Lymphatic: No cervical, supraclavicular or axillary adenopathy Lungs no rales or rhonchi, good excursion bilaterally Heart regular rate and rhythm, no murmur appreciated Abd soft, nontender, positive bowel sounds, no liver  or spleen tip palpated on exam, no fluid wave  MSK no focal spinal tenderness, no joint edema Neuro: non-focal, well-oriented, appropriate affect Breasts: Deferred   Lab Results  Component Value Date   WBC 4.8 11/13/2017   HGB 10.3 (L) 08/07/2017   HCT 33.1 (L) 11/13/2017   MCV 96.5 11/13/2017   PLT 187 11/13/2017   Lab Results  Component Value Date   FERRITIN 131 08/07/2017   IRON 78 08/07/2017   TIBC 229 08/07/2017   UIBC 151 08/07/2017   IRONPCTSAT 34 08/07/2017   Lab Results  Component Value Date   RBC 3.43 (L) 11/13/2017   Lab Results   Component Value Date   KPAFRELGTCHN 21.6 (H) 10/30/2017   LAMBDASER 72.5 (H) 10/30/2017   KAPLAMBRATIO 0.30 10/30/2017   Lab Results  Component Value Date   IGGSERUM 769 10/30/2017   IGA 65 10/30/2017   IGMSERUM 21 10/30/2017   Lab Results  Component Value Date   TOTALPROTELP 5.6 (L) 10/30/2017   ALBUMINELP 3.1 10/30/2017   A1GS 0.2 10/30/2017   A2GS 0.7 10/30/2017   BETS 0.8 10/30/2017   BETA2SER 0.3 07/28/2015   GAMS 0.8 10/30/2017   MSPIKE 0.5 (H) 10/30/2017   SPEI Comment 10/30/2017     Chemistry      Component Value Date/Time   NA 142 11/13/2017 1320   NA 146 (H) 08/07/2017 1153   NA 141 01/09/2017 1004   K 4.3 11/13/2017 1320   K 4.6 08/07/2017 1153   K 4.4 01/09/2017 1004   CL 110 (H) 11/13/2017 1320   CL 108 08/07/2017 1153   CO2 24 11/13/2017 1320   CO2 26 08/07/2017 1153   CO2 22 01/09/2017 1004   BUN 28 (H) 11/13/2017 1320   BUN 24 (H) 08/07/2017 1153   BUN 23.9 01/09/2017 1004   CREATININE 1.30 (H) 11/13/2017 1320   CREATININE 1.5 (H) 08/07/2017 1153   CREATININE 1.5 (H) 01/09/2017 1004      Component Value Date/Time   CALCIUM 8.9 11/13/2017 1320   CALCIUM 8.8 08/07/2017 1153   CALCIUM 8.8 01/09/2017 1004   ALKPHOS 86 (H) 11/13/2017 1320   ALKPHOS 97 (H) 08/07/2017 1153   ALKPHOS 80 01/09/2017 1004   AST 27 11/13/2017 1320   AST 18 01/09/2017 1004   ALT 19 11/13/2017 1320   ALT 20 08/07/2017 1153   ALT 12 01/09/2017 1004   BILITOT 1.2 11/13/2017 1320   BILITOT 0.92 01/09/2017 1004      Impression and Plan: Christian Burns is a very pleasant 84 yo caucasian gentleman with relapsed IgG lambda myeloma. He is doing well and has no complaints at this time.  He needs to have the Moh's procedure to his left cheek. From our standpoint this is fine.  No Aranesp needed this visit. Hgb 11.1.  We will proceed with treatment today as planned.  He will see Dr. Marin Olp again in 2 weeks for follow-up, lab and treatment.  They will let us know if they have any  questions or concerns. We can certainly see him sooner if need be.   Laverna Peace, NP 4/4/20191:58 PM

## 2017-11-13 NOTE — Patient Instructions (Signed)
Cyclophosphamide injection What is this medicine? CYCLOPHOSPHAMIDE (sye kloe FOSS fa mide) is a chemotherapy drug. It slows the growth of cancer cells. This medicine is used to treat many types of cancer like lymphoma, myeloma, leukemia, breast cancer, and ovarian cancer, to name a few. This medicine may be used for other purposes; ask your health care provider or pharmacist if you have questions. COMMON BRAND NAME(S): Cytoxan, Neosar What should I tell my health care provider before I take this medicine? They need to know if you have any of these conditions: -blood disorders -history of other chemotherapy -infection -kidney disease -liver disease -recent or ongoing radiation therapy -tumors in the bone marrow -an unusual or allergic reaction to cyclophosphamide, other chemotherapy, other medicines, foods, dyes, or preservatives -pregnant or trying to get pregnant -breast-feeding How should I use this medicine? This drug is usually given as an injection into a vein or muscle or by infusion into a vein. It is administered in a hospital or clinic by a specially trained health care professional. Talk to your pediatrician regarding the use of this medicine in children. Special care may be needed. Overdosage: If you think you have taken too much of this medicine contact a poison control center or emergency room at once. NOTE: This medicine is only for you. Do not share this medicine with others. What if I miss a dose? It is important not to miss your dose. Call your doctor or health care professional if you are unable to keep an appointment. What may interact with this medicine? This medicine may interact with the following medications: -amiodarone -amphotericin B -azathioprine -certain antiviral medicines for HIV or AIDS such as protease inhibitors (e.g., indinavir, ritonavir) and zidovudine -certain blood pressure medications such as benazepril, captopril, enalapril, fosinopril,  lisinopril, moexipril, monopril, perindopril, quinapril, ramipril, trandolapril -certain cancer medications such as anthracyclines (e.g., daunorubicin, doxorubicin), busulfan, cytarabine, paclitaxel, pentostatin, tamoxifen, trastuzumab -certain diuretics such as chlorothiazide, chlorthalidone, hydrochlorothiazide, indapamide, metolazone -certain medicines that treat or prevent blood clots like warfarin -certain muscle relaxants such as succinylcholine -cyclosporine -etanercept -indomethacin -medicines to increase blood counts like filgrastim, pegfilgrastim, sargramostim -medicines used as general anesthesia -metronidazole -natalizumab This list may not describe all possible interactions. Give your health care provider a list of all the medicines, herbs, non-prescription drugs, or dietary supplements you use. Also tell them if you smoke, drink alcohol, or use illegal drugs. Some items may interact with your medicine. What should I watch for while using this medicine? Visit your doctor for checks on your progress. This drug may make you feel generally unwell. This is not uncommon, as chemotherapy can affect healthy cells as well as cancer cells. Report any side effects. Continue your course of treatment even though you feel ill unless your doctor tells you to stop. Drink water or other fluids as directed. Urinate often, even at night. In some cases, you may be given additional medicines to help with side effects. Follow all directions for their use. Call your doctor or health care professional for advice if you get a fever, chills or sore throat, or other symptoms of a cold or flu. Do not treat yourself. This drug decreases your body's ability to fight infections. Try to avoid being around people who are sick. This medicine may increase your risk to bruise or bleed. Call your doctor or health care professional if you notice any unusual bleeding. Be careful brushing and flossing your teeth or using a  toothpick because you may get an infection or bleed   more easily. If you have any dental work done, tell your dentist you are receiving this medicine. You may get drowsy or dizzy. Do not drive, use machinery, or do anything that needs mental alertness until you know how this medicine affects you. Do not become pregnant while taking this medicine or for 1 year after stopping it. Women should inform their doctor if they wish to become pregnant or think they might be pregnant. Men should not father a child while taking this medicine and for 4 months after stopping it. There is a potential for serious side effects to an unborn child. Talk to your health care professional or pharmacist for more information. Do not breast-feed an infant while taking this medicine. This medicine may interfere with the ability to have a child. This medicine has caused ovarian failure in some women. This medicine has caused reduced sperm counts in some men. You should talk with your doctor or health care professional if you are concerned about your fertility. If you are going to have surgery, tell your doctor or health care professional that you have taken this medicine. What side effects may I notice from receiving this medicine? Side effects that you should report to your doctor or health care professional as soon as possible: -allergic reactions like skin rash, itching or hives, swelling of the face, lips, or tongue -low blood counts - this medicine may decrease the number of white blood cells, red blood cells and platelets. You may be at increased risk for infections and bleeding. -signs of infection - fever or chills, cough, sore throat, pain or difficulty passing urine -signs of decreased platelets or bleeding - bruising, pinpoint red spots on the skin, black, tarry stools, blood in the urine -signs of decreased red blood cells - unusually weak or tired, fainting spells, lightheadedness -breathing problems -dark  urine -dizziness -palpitations -swelling of the ankles, feet, hands -trouble passing urine or change in the amount of urine -weight gain -yellowing of the eyes or skin Side effects that usually do not require medical attention (report to your doctor or health care professional if they continue or are bothersome): -changes in nail or skin color -hair loss -missed menstrual periods -mouth sores -nausea, vomiting This list may not describe all possible side effects. Call your doctor for medical advice about side effects. You may report side effects to FDA at 1-800-FDA-1088. Where should I keep my medicine? This drug is given in a hospital or clinic and will not be stored at home. NOTE: This sheet is a summary. It may not cover all possible information. If you have questions about this medicine, talk to your doctor, pharmacist, or health care provider.  2018 Elsevier/Gold Standard (2012-06-12 16:22:58) Carfilzomib injection What is this medicine? CARFILZOMIB (kar FILZ oh mib) targets a specific protein within cancer cells and stops the cancer cells from growing. It is used to treat multiple myeloma. This medicine may be used for other purposes; ask your health care provider or pharmacist if you have questions. COMMON BRAND NAME(S): KYPROLIS What should I tell my health care provider before I take this medicine? They need to know if you have any of these conditions: -heart disease -history of blood clots -irregular heartbeat -kidney disease -liver disease -lung or breathing disease -an unusual or allergic reaction to carfilzomib, or other medicines, foods, dyes, or preservatives -pregnant or trying to get pregnant -breast-feeding How should I use this medicine? This medicine is for injection or infusion into a vein. It is given  by a health care professional in a hospital or clinic setting. Talk to your pediatrician regarding the use of this medicine in children. Special care may be  needed. Overdosage: If you think you have taken too much of this medicine contact a poison control center or emergency room at once. NOTE: This medicine is only for you. Do not share this medicine with others. What if I miss a dose? It is important not to miss your dose. Call your doctor or health care professional if you are unable to keep an appointment. What may interact with this medicine? Interactions are not expected. Give your health care provider a list of all the medicines, herbs, non-prescription drugs, or dietary supplements you use. Also tell them if you smoke, drink alcohol, or use illegal drugs. Some items may interact with your medicine. This list may not describe all possible interactions. Give your health care provider a list of all the medicines, herbs, non-prescription drugs, or dietary supplements you use. Also tell them if you smoke, drink alcohol, or use illegal drugs. Some items may interact with your medicine. What should I watch for while using this medicine? Your condition will be monitored carefully while you are receiving this medicine. Report any side effects. Continue your course of treatment even though you feel ill unless your doctor tells you to stop. You may need blood work done while you are taking this medicine. Do not become pregnant while taking this medicine or for at least 30 days after stopping it. Women should inform their doctor if they wish to become pregnant or think they might be pregnant. There is a potential for serious side effects to an unborn child. Men should not father a child while taking this medicine and for 90 days after stopping it. Talk to your health care professional or pharmacist for more information. Do not breast-feed an infant while taking this medicine. Check with your doctor or health care professional if you get an attack of severe diarrhea, nausea and vomiting, or if you sweat a lot. The loss of too much body fluid can make it  dangerous for you to take this medicine. You may get dizzy. Do not drive, use machinery, or do anything that needs mental alertness until you know how this medicine affects you. Do not stand or sit up quickly, especially if you are an older patient. This reduces the risk of dizzy or fainting spells. What side effects may I notice from receiving this medicine? Side effects that you should report to your doctor or health care professional as soon as possible: -allergic reactions like skin rash, itching or hives, swelling of the face, lips, or tongue -confusion -dizziness -feeling faint or lightheaded -fever or chills -palpitations -seizures -signs and symptoms of bleeding such as bloody or black, tarry stools; red or dark-brown urine; spitting up blood or brown material that looks like coffee grounds; red spots on the skin; unusual bruising or bleeding including from the eye, gums, or nose -signs and symptoms of a blood clot such as breathing problems; changes in vision; chest pain; severe, sudden headache; pain, swelling, warmth in the leg; trouble speaking; sudden numbness or weakness of the face, arm or leg -signs and symptoms of kidney injury like trouble passing urine or change in the amount of urine -signs and symptoms of liver injury like dark yellow or brown urine; general ill feeling or flu-like symptoms; light-colored stools; loss of appetite; nausea; right upper belly pain; unusually weak or tired; yellowing of the   eyes or skin Side effects that usually do not require medical attention (report to your doctor or health care professional if they continue or are bothersome): -back pain -cough -diarrhea -headache -muscle cramps -vomiting This list may not describe all possible side effects. Call your doctor for medical advice about side effects. You may report side effects to FDA at 1-800-FDA-1088. Where should I keep my medicine? This drug is given in a hospital or clinic and will not  be stored at home. NOTE: This sheet is a summary. It may not cover all possible information. If you have questions about this medicine, talk to your doctor, pharmacist, or health care provider.  2018 Elsevier/Gold Standard (2015-08-31 13:39:23)  

## 2017-11-19 DIAGNOSIS — R350 Frequency of micturition: Secondary | ICD-10-CM | POA: Diagnosis not present

## 2017-11-19 DIAGNOSIS — N401 Enlarged prostate with lower urinary tract symptoms: Secondary | ICD-10-CM | POA: Diagnosis not present

## 2017-11-27 ENCOUNTER — Encounter: Payer: Self-pay | Admitting: Hematology & Oncology

## 2017-11-27 ENCOUNTER — Inpatient Hospital Stay: Payer: Medicare HMO

## 2017-11-27 ENCOUNTER — Inpatient Hospital Stay (HOSPITAL_BASED_OUTPATIENT_CLINIC_OR_DEPARTMENT_OTHER): Payer: Medicare HMO | Admitting: Hematology & Oncology

## 2017-11-27 ENCOUNTER — Other Ambulatory Visit: Payer: Self-pay

## 2017-11-27 VITALS — BP 143/64 | HR 80 | Temp 98.7°F | Resp 17 | Wt 144.0 lb

## 2017-11-27 DIAGNOSIS — D508 Other iron deficiency anemias: Secondary | ICD-10-CM

## 2017-11-27 DIAGNOSIS — G620 Drug-induced polyneuropathy: Secondary | ICD-10-CM

## 2017-11-27 DIAGNOSIS — Z5112 Encounter for antineoplastic immunotherapy: Secondary | ICD-10-CM | POA: Diagnosis not present

## 2017-11-27 DIAGNOSIS — C9002 Multiple myeloma in relapse: Secondary | ICD-10-CM

## 2017-11-27 DIAGNOSIS — Z923 Personal history of irradiation: Secondary | ICD-10-CM

## 2017-11-27 DIAGNOSIS — C9000 Multiple myeloma not having achieved remission: Secondary | ICD-10-CM

## 2017-11-27 DIAGNOSIS — Z5111 Encounter for antineoplastic chemotherapy: Secondary | ICD-10-CM | POA: Diagnosis not present

## 2017-11-27 DIAGNOSIS — Z79899 Other long term (current) drug therapy: Secondary | ICD-10-CM

## 2017-11-27 DIAGNOSIS — C9001 Multiple myeloma in remission: Secondary | ICD-10-CM

## 2017-11-27 LAB — CMP (CANCER CENTER ONLY)
ALT: 26 U/L (ref 10–47)
ANION GAP: 6 (ref 5–15)
AST: 23 U/L (ref 11–38)
Albumin: 3.1 g/dL — ABNORMAL LOW (ref 3.5–5.0)
Alkaline Phosphatase: 76 U/L (ref 26–84)
BILIRUBIN TOTAL: 1.2 mg/dL (ref 0.2–1.6)
BUN: 33 mg/dL — ABNORMAL HIGH (ref 7–22)
CALCIUM: 8.7 mg/dL (ref 8.0–10.3)
CO2: 23 mmol/L (ref 18–33)
Chloride: 110 mmol/L — ABNORMAL HIGH (ref 98–108)
Creatinine: 1.6 mg/dL — ABNORMAL HIGH (ref 0.60–1.20)
GLUCOSE: 134 mg/dL — AB (ref 73–118)
POTASSIUM: 4.2 mmol/L (ref 3.3–4.7)
Sodium: 139 mmol/L (ref 128–145)
Total Protein: 6.3 g/dL — ABNORMAL LOW (ref 6.4–8.1)

## 2017-11-27 LAB — CBC WITH DIFFERENTIAL (CANCER CENTER ONLY)
Basophils Absolute: 0.1 10*3/uL (ref 0.0–0.1)
Basophils Relative: 1 %
EOS PCT: 5 %
Eosinophils Absolute: 0.3 10*3/uL (ref 0.0–0.5)
HEMATOCRIT: 31.7 % — AB (ref 38.7–49.9)
Hemoglobin: 10.9 g/dL — ABNORMAL LOW (ref 13.0–17.1)
LYMPHS ABS: 0.3 10*3/uL — AB (ref 0.9–3.3)
LYMPHS PCT: 5 %
MCH: 32.4 pg (ref 28.0–33.4)
MCHC: 34.4 g/dL (ref 32.0–35.9)
MCV: 94.3 fL (ref 82.0–98.0)
MONO ABS: 0.8 10*3/uL (ref 0.1–0.9)
MONOS PCT: 13 %
NEUTROS ABS: 4.5 10*3/uL (ref 1.5–6.5)
Neutrophils Relative %: 76 %
PLATELETS: 170 10*3/uL (ref 145–400)
RBC: 3.36 MIL/uL — ABNORMAL LOW (ref 4.20–5.70)
RDW: 13.9 % (ref 11.1–15.7)
WBC Count: 6 10*3/uL (ref 4.0–10.0)

## 2017-11-27 MED ORDER — DEXAMETHASONE SODIUM PHOSPHATE 10 MG/ML IJ SOLN
INTRAMUSCULAR | Status: AC
  Filled 2017-11-27: qty 1

## 2017-11-27 MED ORDER — SODIUM CHLORIDE 0.9 % IV SOLN
300.0000 mg/m2 | Freq: Once | INTRAVENOUS | Status: AC
  Administered 2017-11-27: 540 mg via INTRAVENOUS
  Filled 2017-11-27: qty 27

## 2017-11-27 MED ORDER — PALONOSETRON HCL INJECTION 0.25 MG/5ML
INTRAVENOUS | Status: AC
  Filled 2017-11-27: qty 5

## 2017-11-27 MED ORDER — DEXTROSE 5 % IV SOLN
60.0000 mg | Freq: Once | INTRAVENOUS | Status: AC
  Administered 2017-11-27: 60 mg via INTRAVENOUS
  Filled 2017-11-27: qty 30

## 2017-11-27 MED ORDER — PALONOSETRON HCL INJECTION 0.25 MG/5ML
0.2500 mg | Freq: Once | INTRAVENOUS | Status: AC
  Administered 2017-11-27: 0.25 mg via INTRAVENOUS

## 2017-11-27 MED ORDER — DEXAMETHASONE SODIUM PHOSPHATE 10 MG/ML IJ SOLN
10.0000 mg | Freq: Once | INTRAMUSCULAR | Status: AC
  Administered 2017-11-27: 10 mg via INTRAVENOUS

## 2017-11-27 MED ORDER — SODIUM CHLORIDE 0.9 % IV SOLN
Freq: Once | INTRAVENOUS | Status: AC
  Administered 2017-11-27: 15:00:00 via INTRAVENOUS

## 2017-11-27 NOTE — Patient Instructions (Signed)
Carfilzomib injection What is this medicine? CARFILZOMIB (kar FILZ oh mib) targets a specific protein within cancer cells and stops the cancer cells from growing. It is used to treat multiple myeloma. This medicine may be used for other purposes; ask your health care provider or pharmacist if you have questions. COMMON BRAND NAME(S): KYPROLIS What should I tell my health care provider before I take this medicine? They need to know if you have any of these conditions: -heart disease -history of blood clots -irregular heartbeat -kidney disease -liver disease -lung or breathing disease -an unusual or allergic reaction to carfilzomib, or other medicines, foods, dyes, or preservatives -pregnant or trying to get pregnant -breast-feeding How should I use this medicine? This medicine is for injection or infusion into a vein. It is given by a health care professional in a hospital or clinic setting. Talk to your pediatrician regarding the use of this medicine in children. Special care may be needed. Overdosage: If you think you have taken too much of this medicine contact a poison control center or emergency room at once. NOTE: This medicine is only for you. Do not share this medicine with others. What if I miss a dose? It is important not to miss your dose. Call your doctor or health care professional if you are unable to keep an appointment. What may interact with this medicine? Interactions are not expected. Give your health care provider a list of all the medicines, herbs, non-prescription drugs, or dietary supplements you use. Also tell them if you smoke, drink alcohol, or use illegal drugs. Some items may interact with your medicine. This list may not describe all possible interactions. Give your health care provider a list of all the medicines, herbs, non-prescription drugs, or dietary supplements you use. Also tell them if you smoke, drink alcohol, or use illegal drugs. Some items may  interact with your medicine. What should I watch for while using this medicine? Your condition will be monitored carefully while you are receiving this medicine. Report any side effects. Continue your course of treatment even though you feel ill unless your doctor tells you to stop. You may need blood work done while you are taking this medicine. Do not become pregnant while taking this medicine or for at least 30 days after stopping it. Women should inform their doctor if they wish to become pregnant or think they might be pregnant. There is a potential for serious side effects to an unborn child. Men should not father a child while taking this medicine and for 90 days after stopping it. Talk to your health care professional or pharmacist for more information. Do not breast-feed an infant while taking this medicine. Check with your doctor or health care professional if you get an attack of severe diarrhea, nausea and vomiting, or if you sweat a lot. The loss of too much body fluid can make it dangerous for you to take this medicine. You may get dizzy. Do not drive, use machinery, or do anything that needs mental alertness until you know how this medicine affects you. Do not stand or sit up quickly, especially if you are an older patient. This reduces the risk of dizzy or fainting spells. What side effects may I notice from receiving this medicine? Side effects that you should report to your doctor or health care professional as soon as possible: -allergic reactions like skin rash, itching or hives, swelling of the face, lips, or tongue -confusion -dizziness -feeling faint or lightheaded -fever or chills -  palpitations -seizures -signs and symptoms of bleeding such as bloody or black, tarry stools; red or dark-brown urine; spitting up blood or brown material that looks like coffee grounds; red spots on the skin; unusual bruising or bleeding including from the eye, gums, or nose -signs and symptoms of  a blood clot such as breathing problems; changes in vision; chest pain; severe, sudden headache; pain, swelling, warmth in the leg; trouble speaking; sudden numbness or weakness of the face, arm or leg -signs and symptoms of kidney injury like trouble passing urine or change in the amount of urine -signs and symptoms of liver injury like dark yellow or brown urine; general ill feeling or flu-like symptoms; light-colored stools; loss of appetite; nausea; right upper belly pain; unusually weak or tired; yellowing of the eyes or skin Side effects that usually do not require medical attention (report to your doctor or health care professional if they continue or are bothersome): -back pain -cough -diarrhea -headache -muscle cramps -vomiting This list may not describe all possible side effects. Call your doctor for medical advice about side effects. You may report side effects to FDA at 1-800-FDA-1088. Where should I keep my medicine? This drug is given in a hospital or clinic and will not be stored at home. NOTE: This sheet is a summary. It may not cover all possible information. If you have questions about this medicine, talk to your doctor, pharmacist, or health care provider.  2018 Elsevier/Gold Standard (2015-08-31 13:39:23)  

## 2017-11-27 NOTE — Progress Notes (Signed)
Okay to treat with SCr = 1.6 today per Dr. Marin Olp.

## 2017-11-27 NOTE — Progress Notes (Signed)
Hematology and Oncology Follow Up Visit  Christian Burns [id number] September 03, 1929 84 y.o. 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 He is doing well and has been to see his dermatologist. He has several spots removed from his cheeks and forehead and will need to have a Moh's procedure done on the left cheek.  In March, his M-spike was up slightly at 0.5 g/dL, IgG 769 mg/dL and lambda light chain 7.25 mg/dL.  No fever, chills, n/v, cough, rash, dizziness, SOB, chest pain, palpitations, abdominal pain or changes in bowel or bladder habits.   He has had some loose stools but states that he has not needed to take his imodium yet.   The numbness and tingling in his hands is unchanged.   He feels that the neuropathy in his feet is a little better.   No falls or syncopal episodes.   No swelling or tenderness in his extremities. No c/o pain.   He has maintained a good appetite and is staying well hydrated. His weight is stable.   ECOG Performance Status: 1 - Symptomatic but completely ambulatory  Medications:  Allergies as of 11/27/2017      Reactions   Sulfa Antibiotics Itching   Sulfasalazine Itching      Medication List        Accurate as of 11/27/17  2:06 PM. Always use your most recent med list.          amLODipine-benazepril 5-10 MG capsule Commonly known as:  LOTREL TAKE 1 CAPSULE BY MOUTH DAILY   MULTIVITAL PO Take 1 tablet by mouth every  morning.   pyridOXINE 100 MG tablet Commonly known as:  VITAMIN B-6 Take 100 mg by mouth daily.   VITAMIN B 12 PO Take 1,000 mcg by mouth every morning.   Vitamin D (Ergocalciferol) 50000 units Caps capsule Commonly known as:  DRISDOL Take 50,000 Units by mouth every 7 (seven) days. Sundays       Allergies:  Allergies  Allergen Reactions  . Sulfa Antibiotics Itching  . Sulfasalazine Itching    Past Medical History, Surgical history, Social history, and Family History were reviewed and updated.  Review of Systems: All other 10 point review of systems is negative.   Physical Exam:  weight is 144 lb (65.3 kg). His oral temperature is 98.7 F (37.1 C). His blood pressure is 143/64 (abnormal) and his pulse is 80. His respiration is 17 and oxygen saturation is 97%.   Wt Readings from Last 3 Encounters:  11/27/17 144 lb (65.3 kg)  11/13/17 145 lb (65.8 kg)  10/30/17 148 lb (67.1 kg)    Ocular: Sclerae unicteric, pupils equal, round and reactive to light Ear-nose-throat: Oropharynx clear, dentition fair Lymphatic: No cervical, supraclavicular or axillary adenopathy Lungs no rales or rhonchi, good excursion bilaterally Heart regular rate and rhythm, no murmur appreciated Abd soft, nontender, positive bowel sounds, no liver or spleen tip palpated on exam, no fluid wave  MSK no focal spinal  tenderness, no joint edema Neuro: non-focal, well-oriented, appropriate affect Breasts: Deferred   Lab Results  Component Value Date   WBC 6.0 11/27/2017   HGB 10.3 (L) 08/07/2017   HCT 31.7 (L) 11/27/2017   MCV 94.3 11/27/2017   PLT 170 11/27/2017   Lab Results  Component Value Date   FERRITIN 131 08/07/2017   IRON 78 08/07/2017   TIBC 229 08/07/2017   UIBC 151 08/07/2017   IRONPCTSAT 34 08/07/2017   Lab Results  Component Value Date   RBC 3.36 (L) 11/27/2017   Lab Results  Component Value Date   KPAFRELGTCHN 21.6 (H) 10/30/2017   LAMBDASER 72.5 (H) 10/30/2017    KAPLAMBRATIO 0.30 10/30/2017   Lab Results  Component Value Date   IGGSERUM 769 10/30/2017   IGA 65 10/30/2017   IGMSERUM 21 10/30/2017   Lab Results  Component Value Date   TOTALPROTELP 5.6 (L) 10/30/2017   ALBUMINELP 3.1 10/30/2017   A1GS 0.2 10/30/2017   A2GS 0.7 10/30/2017   BETS 0.8 10/30/2017   BETA2SER 0.3 07/28/2015   GAMS 0.8 10/30/2017   MSPIKE 0.5 (H) 10/30/2017   SPEI Comment 10/30/2017     Chemistry      Component Value Date/Time   NA 139 11/27/2017 1319   NA 146 (H) 08/07/2017 1153   NA 141 01/09/2017 1004   K 4.2 11/27/2017 1319   K 4.6 08/07/2017 1153   K 4.4 01/09/2017 1004   CL 110 (H) 11/27/2017 1319   CL 108 08/07/2017 1153   CO2 23 11/27/2017 1319   CO2 26 08/07/2017 1153   CO2 22 01/09/2017 1004   BUN 33 (H) 11/27/2017 1319   BUN 24 (H) 08/07/2017 1153   BUN 23.9 01/09/2017 1004   CREATININE 1.60 (H) 11/27/2017 1319   CREATININE 1.5 (H) 08/07/2017 1153   CREATININE 1.5 (H) 01/09/2017 1004      Component Value Date/Time   CALCIUM 8.7 11/27/2017 1319   CALCIUM 8.8 08/07/2017 1153   CALCIUM 8.8 01/09/2017 1004   ALKPHOS 76 11/27/2017 1319   ALKPHOS 97 (H) 08/07/2017 1153   ALKPHOS 80 01/09/2017 1004   AST 23 11/27/2017 1319   AST 18 01/09/2017 1004   ALT 26 11/27/2017 1319   ALT 20 08/07/2017 1153   ALT 12 01/09/2017 1004   BILITOT 1.2 11/27/2017 1319   BILITOT 0.92 01/09/2017 1004      Impression and Plan: Christian Burns is a very pleasant 84 yo caucasian gentleman with relapsed IgG lambda myeloma. He is doing well and has no complaints at this time.   We will proceed with treatment today as planned.   He will let us know if they have any questions or concerns. We can certainly see him sooner if need be.   [name], MD 4/18/20192:06 PM

## 2017-11-28 LAB — IRON AND TIBC
IRON: 103 ug/dL (ref 42–163)
Saturation Ratios: 40 % — ABNORMAL LOW (ref 42–163)
TIBC: 255 ug/dL (ref 202–409)
UIBC: 152 ug/dL

## 2017-11-28 LAB — KAPPA/LAMBDA LIGHT CHAINS
KAPPA FREE LGHT CHN: 20.4 mg/L — AB (ref 3.3–19.4)
KAPPA, LAMDA LIGHT CHAIN RATIO: 0.33 (ref 0.26–1.65)
Lambda free light chains: 61.1 mg/L — ABNORMAL HIGH (ref 5.7–26.3)

## 2017-11-28 LAB — IGG, IGA, IGM
IGG (IMMUNOGLOBIN G), SERUM: 860 mg/dL (ref 700–1600)
IgA: 73 mg/dL (ref 61–437)
IgM (Immunoglobulin M), Srm: 23 mg/dL (ref 15–143)

## 2017-11-28 LAB — FERRITIN: FERRITIN: 107 ng/mL (ref 22–316)

## 2017-11-29 ENCOUNTER — Other Ambulatory Visit: Payer: Self-pay | Admitting: Hematology & Oncology

## 2017-11-29 DIAGNOSIS — I10 Essential (primary) hypertension: Secondary | ICD-10-CM

## 2017-12-01 LAB — PROTEIN ELECTROPHORESIS, SERUM
A/G Ratio: 1.2 (ref 0.7–1.7)
ALBUMIN ELP: 3.3 g/dL (ref 2.9–4.4)
ALPHA-2-GLOBULIN: 0.7 g/dL (ref 0.4–1.0)
Alpha-1-Globulin: 0.2 g/dL (ref 0.0–0.4)
Beta Globulin: 0.9 g/dL (ref 0.7–1.3)
GAMMA GLOBULIN: 0.8 g/dL (ref 0.4–1.8)
Globulin, Total: 2.7 g/dL (ref 2.2–3.9)
M-SPIKE, %: 0.6 g/dL — AB
Total Protein ELP: 6 g/dL (ref 6.0–8.5)

## 2017-12-11 ENCOUNTER — Other Ambulatory Visit: Payer: Self-pay

## 2017-12-11 ENCOUNTER — Inpatient Hospital Stay: Payer: Medicare HMO | Attending: Hematology & Oncology | Admitting: Hematology & Oncology

## 2017-12-11 ENCOUNTER — Inpatient Hospital Stay: Payer: Medicare HMO

## 2017-12-11 VITALS — BP 167/87 | HR 68 | Temp 97.8°F | Resp 18 | Wt 147.0 lb

## 2017-12-11 DIAGNOSIS — G620 Drug-induced polyneuropathy: Secondary | ICD-10-CM | POA: Diagnosis not present

## 2017-12-11 DIAGNOSIS — Z5112 Encounter for antineoplastic immunotherapy: Secondary | ICD-10-CM | POA: Diagnosis not present

## 2017-12-11 DIAGNOSIS — C9001 Multiple myeloma in remission: Secondary | ICD-10-CM

## 2017-12-11 DIAGNOSIS — Z5111 Encounter for antineoplastic chemotherapy: Secondary | ICD-10-CM | POA: Insufficient documentation

## 2017-12-11 DIAGNOSIS — C9000 Multiple myeloma not having achieved remission: Secondary | ICD-10-CM

## 2017-12-11 DIAGNOSIS — D508 Other iron deficiency anemias: Secondary | ICD-10-CM | POA: Diagnosis not present

## 2017-12-11 DIAGNOSIS — C9002 Multiple myeloma in relapse: Secondary | ICD-10-CM

## 2017-12-11 DIAGNOSIS — Z923 Personal history of irradiation: Secondary | ICD-10-CM | POA: Insufficient documentation

## 2017-12-11 DIAGNOSIS — Z79899 Other long term (current) drug therapy: Secondary | ICD-10-CM | POA: Diagnosis not present

## 2017-12-11 LAB — CMP (CANCER CENTER ONLY)
ALBUMIN: 3.1 g/dL — AB (ref 3.5–5.0)
ALK PHOS: 76 U/L (ref 26–84)
ALT: 21 U/L (ref 10–47)
AST: 26 U/L (ref 11–38)
Anion gap: 5 (ref 5–15)
BUN: 26 mg/dL — AB (ref 7–22)
CALCIUM: 9.2 mg/dL (ref 8.0–10.3)
CO2: 26 mmol/L (ref 18–33)
CREATININE: 1.2 mg/dL (ref 0.60–1.20)
Chloride: 114 mmol/L — ABNORMAL HIGH (ref 98–108)
Glucose, Bld: 106 mg/dL (ref 73–118)
Potassium: 4.4 mmol/L (ref 3.3–4.7)
Sodium: 145 mmol/L (ref 128–145)
TOTAL PROTEIN: 6.1 g/dL — AB (ref 6.4–8.1)
Total Bilirubin: 1 mg/dL (ref 0.2–1.6)

## 2017-12-11 LAB — CBC WITH DIFFERENTIAL (CANCER CENTER ONLY)
BASOS PCT: 1 %
Basophils Absolute: 0.1 10*3/uL (ref 0.0–0.1)
Eosinophils Absolute: 0.3 10*3/uL (ref 0.0–0.5)
Eosinophils Relative: 6 %
HCT: 29.4 % — ABNORMAL LOW (ref 38.7–49.9)
HEMOGLOBIN: 10.1 g/dL — AB (ref 13.0–17.1)
LYMPHS ABS: 0.3 10*3/uL — AB (ref 0.9–3.3)
Lymphocytes Relative: 7 %
MCH: 32.3 pg (ref 28.0–33.4)
MCHC: 34.4 g/dL (ref 32.0–35.9)
MCV: 93.9 fL (ref 82.0–98.0)
Monocytes Absolute: 0.5 10*3/uL (ref 0.1–0.9)
Monocytes Relative: 12 %
NEUTROS PCT: 74 %
Neutro Abs: 3.3 10*3/uL (ref 1.5–6.5)
Platelet Count: 159 10*3/uL (ref 145–400)
RBC: 3.13 MIL/uL — AB (ref 4.20–5.70)
RDW: 13.5 % (ref 11.1–15.7)
WBC: 4.5 10*3/uL (ref 4.0–10.0)

## 2017-12-11 MED ORDER — SODIUM CHLORIDE 0.9 % IV SOLN
Freq: Once | INTRAVENOUS | Status: AC
  Administered 2017-12-11: 15:00:00 via INTRAVENOUS

## 2017-12-11 MED ORDER — PALONOSETRON HCL INJECTION 0.25 MG/5ML
INTRAVENOUS | Status: AC
  Filled 2017-12-11: qty 5

## 2017-12-11 MED ORDER — PALONOSETRON HCL INJECTION 0.25 MG/5ML
0.2500 mg | Freq: Once | INTRAVENOUS | Status: AC
Start: 2017-12-11 — End: 2017-12-11
  Administered 2017-12-11: 0.25 mg via INTRAVENOUS

## 2017-12-11 MED ORDER — DEXAMETHASONE SODIUM PHOSPHATE 10 MG/ML IJ SOLN
INTRAMUSCULAR | Status: AC
  Filled 2017-12-11: qty 1

## 2017-12-11 MED ORDER — DEXAMETHASONE SODIUM PHOSPHATE 10 MG/ML IJ SOLN
10.0000 mg | Freq: Once | INTRAMUSCULAR | Status: AC
  Administered 2017-12-11: 10 mg via INTRAVENOUS

## 2017-12-11 MED ORDER — SODIUM CHLORIDE 0.9 % IV SOLN
300.0000 mg/m2 | Freq: Once | INTRAVENOUS | Status: AC
  Administered 2017-12-11: 540 mg via INTRAVENOUS
  Filled 2017-12-11: qty 27

## 2017-12-11 MED ORDER — DEXTROSE 5 % IV SOLN
60.0000 mg | Freq: Once | INTRAVENOUS | Status: AC
  Administered 2017-12-11: 60 mg via INTRAVENOUS
  Filled 2017-12-11: qty 30

## 2017-12-11 NOTE — Patient Instructions (Signed)
[facility] Discharge Instructions for Patients Receiving Chemotherapy  Today you received the following chemotherapy agents Cytoxan, Kyprolis  To help prevent nausea and vomiting after your treatment, we encourage you to take your nausea medication    If you develop nausea and vomiting that is not controlled by your nausea medication, call the clinic.   BELOW ARE SYMPTOMS THAT SHOULD BE REPORTED IMMEDIATELY:  *FEVER GREATER THAN 100.5 F  *CHILLS WITH OR WITHOUT FEVER  NAUSEA AND VOMITING THAT IS NOT CONTROLLED WITH YOUR NAUSEA MEDICATION  *UNUSUAL SHORTNESS OF BREATH  *UNUSUAL BRUISING OR BLEEDING  TENDERNESS IN MOUTH AND THROAT WITH OR WITHOUT PRESENCE OF ULCERS  *URINARY PROBLEMS  *BOWEL PROBLEMS  UNUSUAL RASH Items with * indicate a potential emergency and should be followed up as soon as possible.  Feel free to call the clinic should you have any questions or concerns. The clinic phone number is [phone].  Please show the CHEMO ALERT CARD at check-in to the Emergency Department and triage nurse.   

## 2017-12-11 NOTE — Progress Notes (Signed)
Hematology and Oncology Follow Up Visit  Christian Burns [id number] 1930/06/03 84 y.o. 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 He is doing well and has been to see his dermatologist. He has several spots removed from his cheeks and forehead and will need to have a Moh's procedure done on the left cheek.  In March, his M-spike was up slightly at 0.6 g/dL, IgG 860 mg/dL and lambda light chain 6.1 mg/dL.  No fever, chills, n/v, cough, rash, dizziness, SOB, chest pain, palpitations, abdominal pain or changes in bowel or bladder habits.   He has had some loose stools but states that he has not needed to take his imodium yet.   The numbness and tingling in his hands is unchanged.   He feels that the neuropathy in his feet is a little better.   No falls or syncopal episodes.   No swelling or tenderness in his extremities. No c/o pain.   He has maintained a good appetite and is staying well hydrated. His weight is stable.   ECOG Performance Status: 1 - Symptomatic but completely ambulatory  Medications:  Allergies as of 12/11/2017      Reactions   Sulfa Antibiotics Itching   Sulfasalazine Itching      Medication List        Accurate as of 12/11/17  2:04 PM. Always use your most recent med list.          amLODipine-benazepril 5-10 MG capsule Commonly known as:  LOTREL TAKE 1 CAPSULE BY MOUTH DAILY   MULTIVITAL PO Take 1 tablet by mouth every morning.   pyridOXINE 100 MG tablet Commonly known as:  VITAMIN B-6 Take 100 mg by mouth daily.   VITAMIN B 12 PO Take 1,000 mcg by mouth every morning.   Vitamin D (Ergocalciferol) 50000 units Caps capsule Commonly known as:  DRISDOL Take 50,000 Units by mouth every 7 (seven) days. Sundays       Allergies:  Allergies  Allergen Reactions  . Sulfa Antibiotics Itching  . Sulfasalazine Itching    Past Medical History, Surgical history, Social history, and Family History were reviewed and updated.  Review of Systems: Review of Systems  Constitutional: Negative.   HENT: Negative.   Eyes: Negative.   Respiratory: Negative.   Cardiovascular: Negative.   Gastrointestinal: Negative.   Genitourinary: Negative.   Musculoskeletal: Negative.   Skin: Negative.   Neurological: Negative.   Endo/Heme/Allergies: Negative.   Psychiatric/Behavioral: Negative.       Physical Exam:  weight is 147 lb (66.7 kg). His oral temperature is 97.8 F (36.6 C). His blood pressure is 167/87 (abnormal) and his pulse is 68. His respiration is 18 and oxygen saturation is 99%.   Wt Readings from Last 3 Encounters:  12/11/17 147 lb (66.7 kg)  11/27/17 144 lb (65.3 kg)  11/13/17 145 lb (65.8 kg)    Physical Exam  Constitutional: He is oriented to person, place, and time.  HENT:  Head:  Normocephalic and atraumatic.  Mouth/Throat: Oropharynx is clear and moist.  Eyes: Pupils are equal, round, and reactive to light. EOM are normal.  Neck: Normal range of motion.  Cardiovascular: Normal rate, regular rhythm and normal heart sounds.  Pulmonary/Chest: Effort normal and breath sounds normal.  Abdominal: Soft. Bowel sounds are normal.  Musculoskeletal: Normal range of motion. He exhibits no edema, tenderness or deformity.  Lymphadenopathy:    He has no cervical adenopathy.  Neurological: He is alert and oriented to person, place, and time.  Skin: Skin is warm and dry. No rash noted. No erythema.    Psychiatric: He has a normal mood and affect. His behavior is normal. Judgment and thought content normal.  Vitals reviewed.    Lab Results  Component Value Date   WBC 4.5 12/11/2017   HGB 10.1 (L) 12/11/2017   HCT 29.4 (L) 12/11/2017   MCV 93.9 12/11/2017   PLT 159 12/11/2017   Lab Results  Component Value Date   FERRITIN 107 11/27/2017   IRON 103 11/27/2017   TIBC 255 11/27/2017   UIBC 152 11/27/2017   IRONPCTSAT 40 (L) 11/27/2017   Lab Results  Component Value Date   RBC 3.13 (L) 12/11/2017   Lab Results  Component Value Date   KPAFRELGTCHN 20.4 (H) 11/27/2017   LAMBDASER 61.1 (H) 11/27/2017   KAPLAMBRATIO 0.33 11/27/2017   Lab Results  Component Value Date   IGGSERUM 860 11/27/2017   IGA 73 11/27/2017   IGMSERUM 23 11/27/2017   Lab Results  Component Value Date   TOTALPROTELP 6.0 11/27/2017   ALBUMINELP 3.3 11/27/2017   A1GS 0.2 11/27/2017   A2GS 0.7 11/27/2017   BETS 0.9 11/27/2017   BETA2SER 0.3 07/28/2015   GAMS 0.8 11/27/2017   MSPIKE 0.6 (H) 11/27/2017   SPEI Comment 11/27/2017     Chemistry      Component Value Date/Time   NA 139 11/27/2017 1319   NA 146 (H) 08/07/2017 1153   NA 141 01/09/2017 1004   K 4.2 11/27/2017 1319   K 4.6 08/07/2017 1153   K 4.4 01/09/2017 1004   CL 110 (H) 11/27/2017 1319   CL 108 08/07/2017 1153   CO2 23 11/27/2017 1319   CO2 26 08/07/2017 1153   CO2 22 01/09/2017 1004   BUN 33 (H) 11/27/2017 1319   BUN 24 (H) 08/07/2017 1153   BUN 23.9 01/09/2017 1004   CREATININE 1.60 (H) 11/27/2017 1319   CREATININE 1.5 (H) 08/07/2017 1153   CREATININE 1.5 (H) 01/09/2017 1004      Component Value Date/Time   CALCIUM 8.7 11/27/2017 1319   CALCIUM 8.8 08/07/2017 1153   CALCIUM 8.8 01/09/2017 1004   ALKPHOS 76 11/27/2017 1319   ALKPHOS 97 (H) 08/07/2017 1153   ALKPHOS 80 01/09/2017 1004   AST 23 11/27/2017 1319   AST 18 01/09/2017 1004   ALT 26 11/27/2017 1319   ALT 20 08/07/2017 1153   ALT 12 01/09/2017 1004    BILITOT 1.2 11/27/2017 1319   BILITOT 0.92 01/09/2017 1004      Impression and Plan: Christian Burns is a very pleasant 84 yo caucasian gentleman with relapsed IgG lambda myeloma. He is doing well and has no complaints at this time.   We will proceed with treatment today as planned.   He will let us know if they have any questions or concerns. We can certainly see him sooner if need be.   [name], MD 5/2/20192:04 PM

## 2017-12-12 LAB — KAPPA/LAMBDA LIGHT CHAINS
Kappa free light chain: 19.5 mg/L — ABNORMAL HIGH (ref 3.3–19.4)
Kappa, lambda light chain ratio: 0.43 (ref 0.26–1.65)
Lambda free light chains: 45 mg/L — ABNORMAL HIGH (ref 5.7–26.3)

## 2017-12-12 LAB — IRON AND TIBC
Iron: 104 ug/dL (ref 42–163)
SATURATION RATIOS: 45 % (ref 42–163)
TIBC: 231 ug/dL (ref 202–409)
UIBC: 128 ug/dL

## 2017-12-12 LAB — IGG, IGA, IGM
IGM (IMMUNOGLOBULIN M), SRM: 27 mg/dL (ref 15–143)
IgA: 65 mg/dL (ref 61–437)
IgG (Immunoglobin G), Serum: 783 mg/dL (ref 700–1600)

## 2017-12-12 LAB — FERRITIN: FERRITIN: 130 ng/mL (ref 22–316)

## 2017-12-17 LAB — PROTEIN ELECTROPHORESIS, SERUM, WITH REFLEX
A/G RATIO SPE: 1.5 (ref 0.7–1.7)
ALPHA-2-GLOBULIN: 0.7 g/dL (ref 0.4–1.0)
Albumin ELP: 3.4 g/dL (ref 2.9–4.4)
Alpha-1-Globulin: 0.2 g/dL (ref 0.0–0.4)
BETA GLOBULIN: 0.7 g/dL (ref 0.7–1.3)
GLOBULIN, TOTAL: 2.2 g/dL (ref 2.2–3.9)
Gamma Globulin: 0.6 g/dL (ref 0.4–1.8)
M-SPIKE, %: 0.5 g/dL — AB
SPEP Interpretation: 0
Total Protein ELP: 5.6 g/dL — ABNORMAL LOW (ref 6.0–8.5)

## 2017-12-17 LAB — IMMUNOFIXATION REFLEX, SERUM
IGG (IMMUNOGLOBIN G), SERUM: 779 mg/dL (ref 700–1600)
IGM (IMMUNOGLOBULIN M), SRM: 21 mg/dL (ref 15–143)
IgA: 63 mg/dL (ref 61–437)

## 2017-12-17 IMAGING — DX DG LUMBAR SPINE COMPLETE 4+V
5 series · 5 of 5 positions shown · non-contrast
Comparison: MRI September 13, 2015.

CLINICAL DATA: Right hip pain and weakness for several months
without known injury.

EXAM:
LUMBAR SPINE - COMPLETE 4+ VIEW

[l-spine ap]
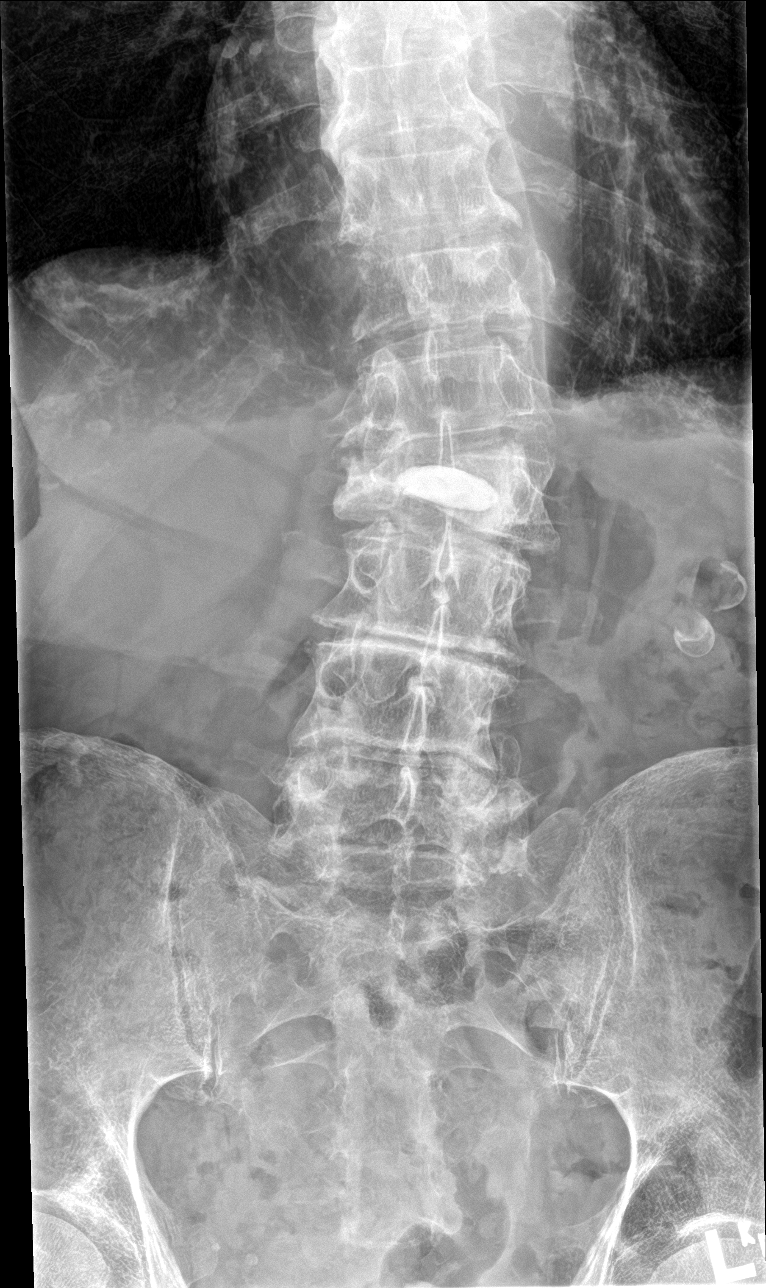

[l-spine obl (1 of 2)]
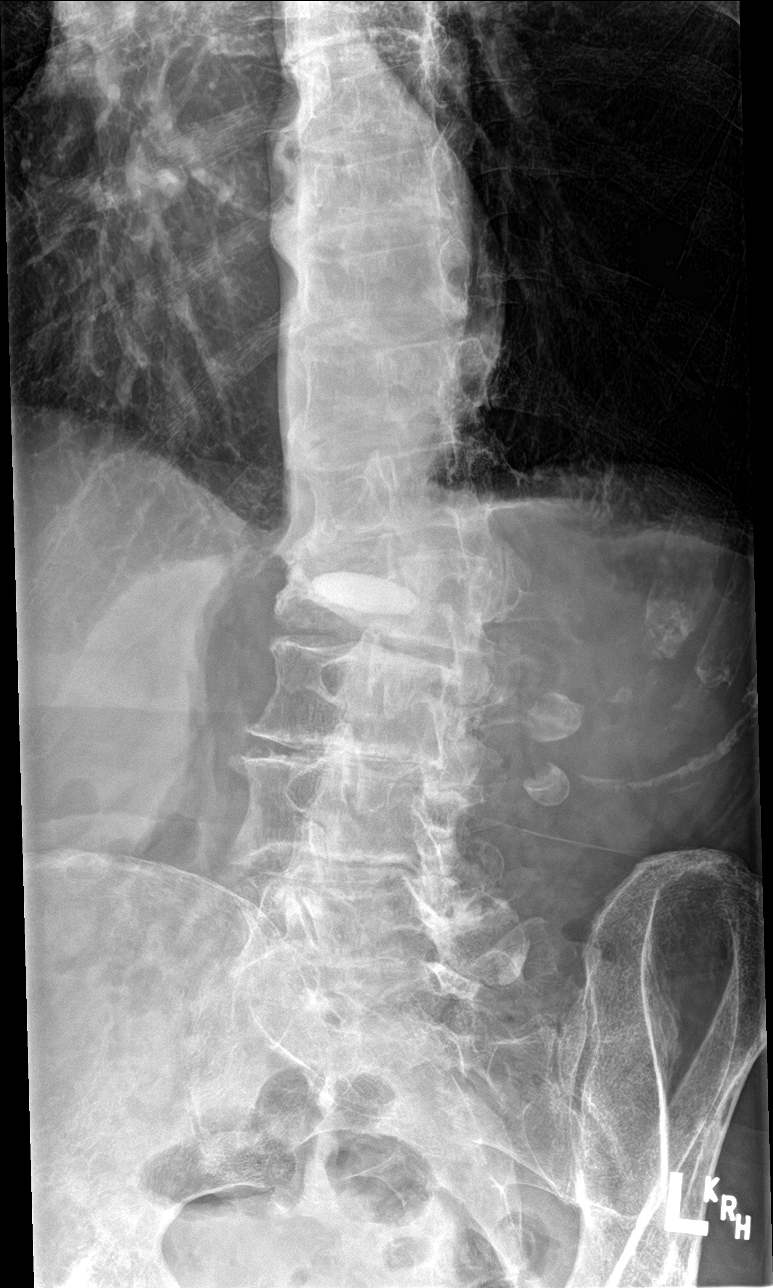

[l-spine obl (2 of 2)]
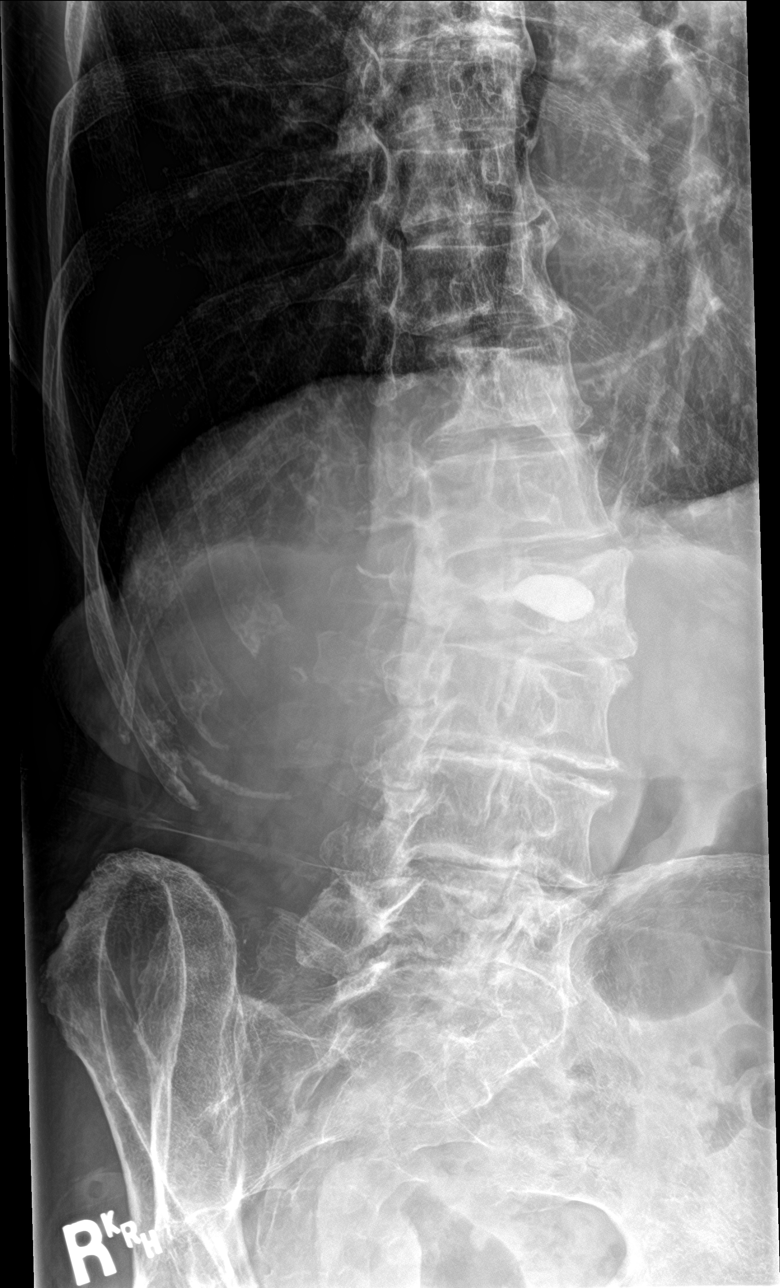

[l-spine lat]
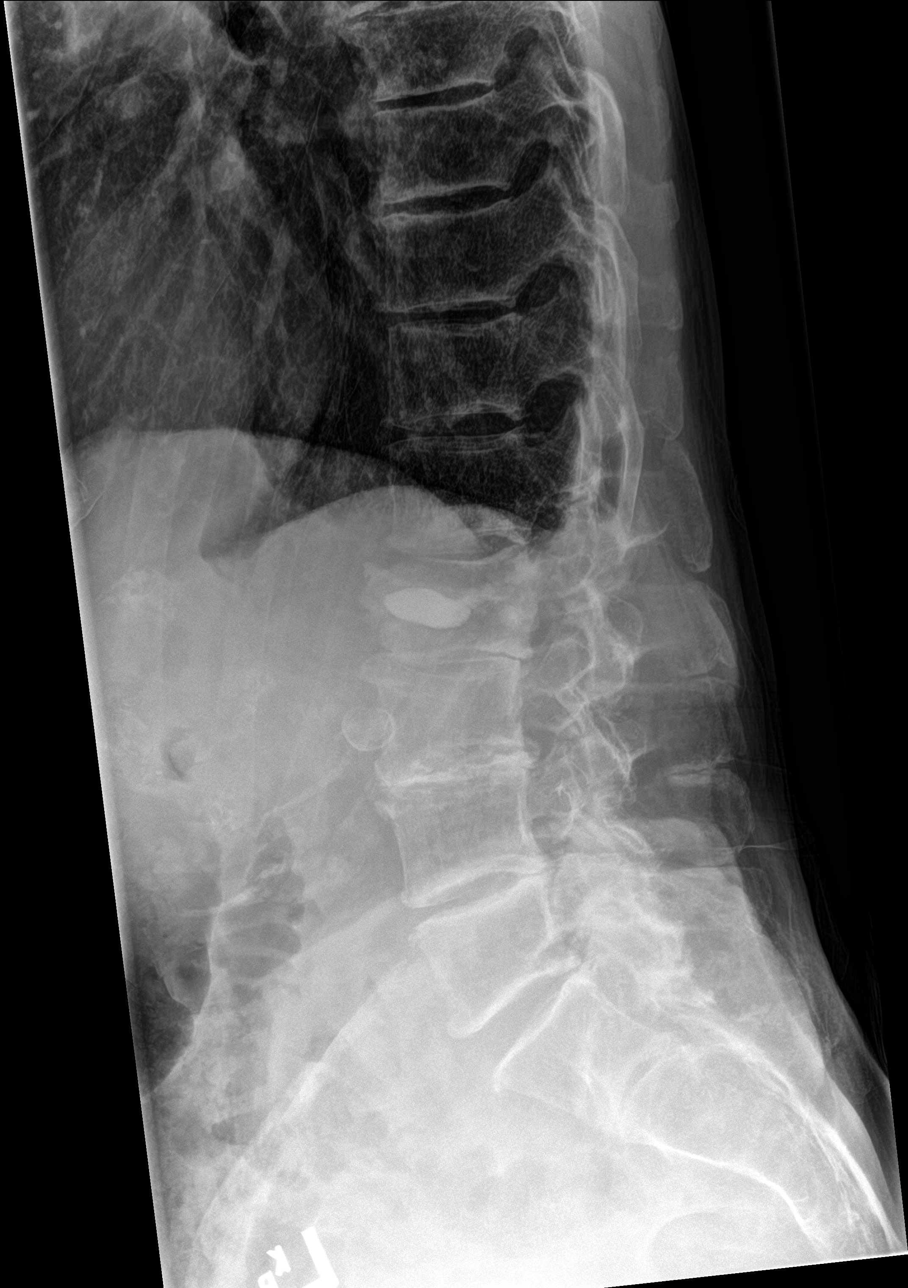

[l-spine spot]
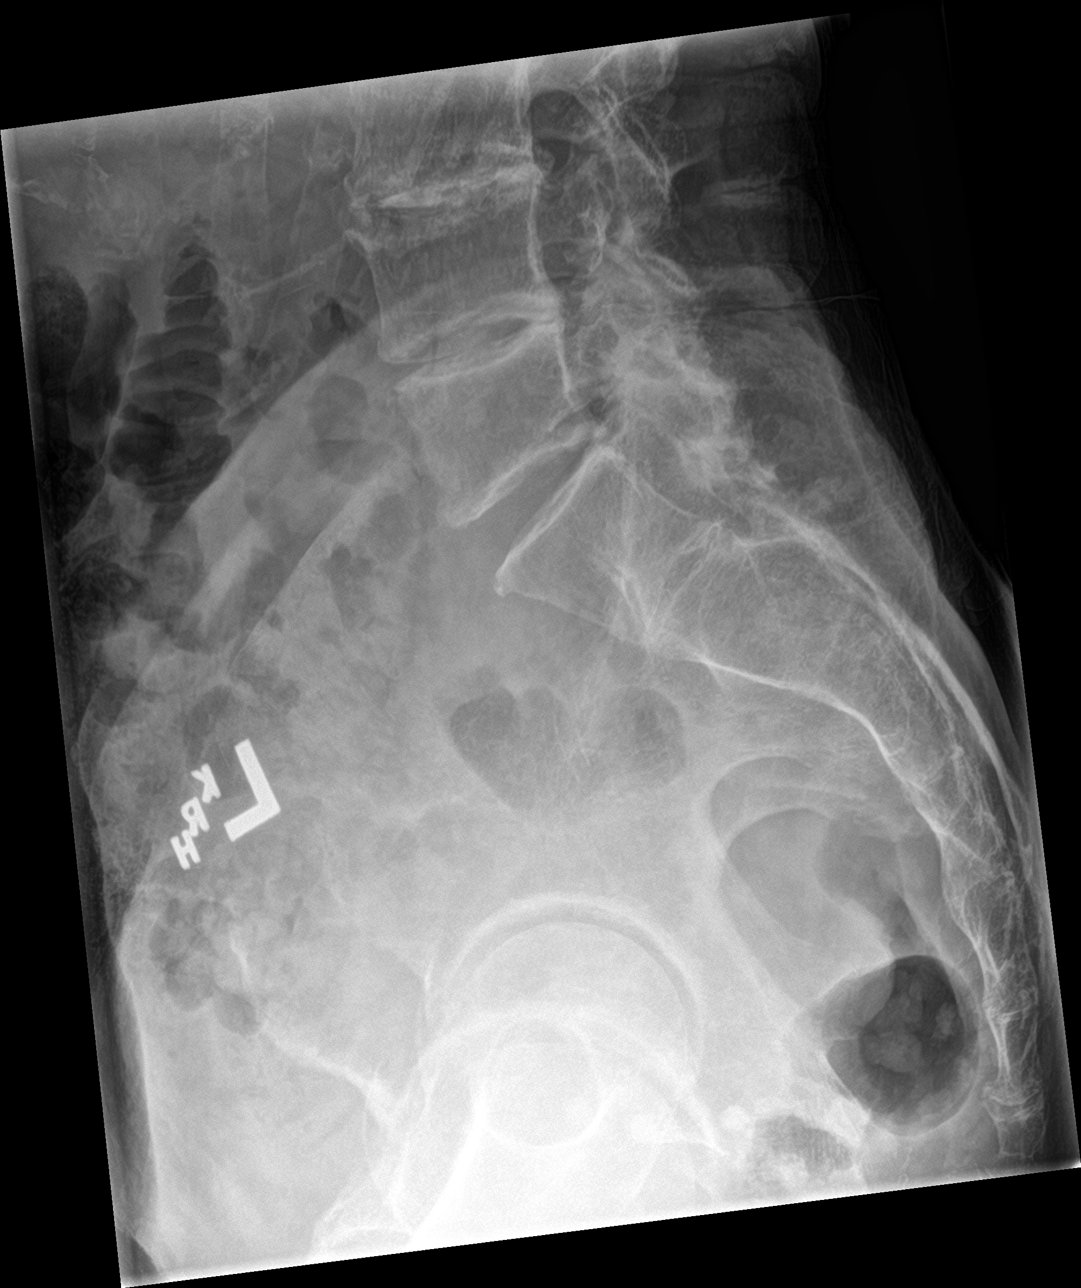

[5 of 5 positions shown; findings below may reference images not displayed]

FINDINGS: Moderate levoscoliosis of lower thoracic and upper lumbar spine is
noted. No fracture or spondylolisthesis is noted. Status post
kyphoplasty at L2 vertebral body. Severe degenerative disc disease
is noted at L2-3 and L3-4. Mild degenerative disc disease is noted
at L1-2 and L4-5.
IMPRESSION: Multilevel degenerative disc disease. No acute abnormality seen in
the lumbar spine.

## 2017-12-17 IMAGING — DX DG HIP (WITH OR WITHOUT PELVIS) 1V*R*
3 series · 4 of 4 positions shown · non-contrast
Comparison: CT scan of June 08, 2013.

CLINICAL DATA: Right hip pain for several months without known
injury. History of multiple myeloma.

EXAM:
DG HIP (WITH OR WITHOUT PELVIS) 1V RIGHT

[hip ap]
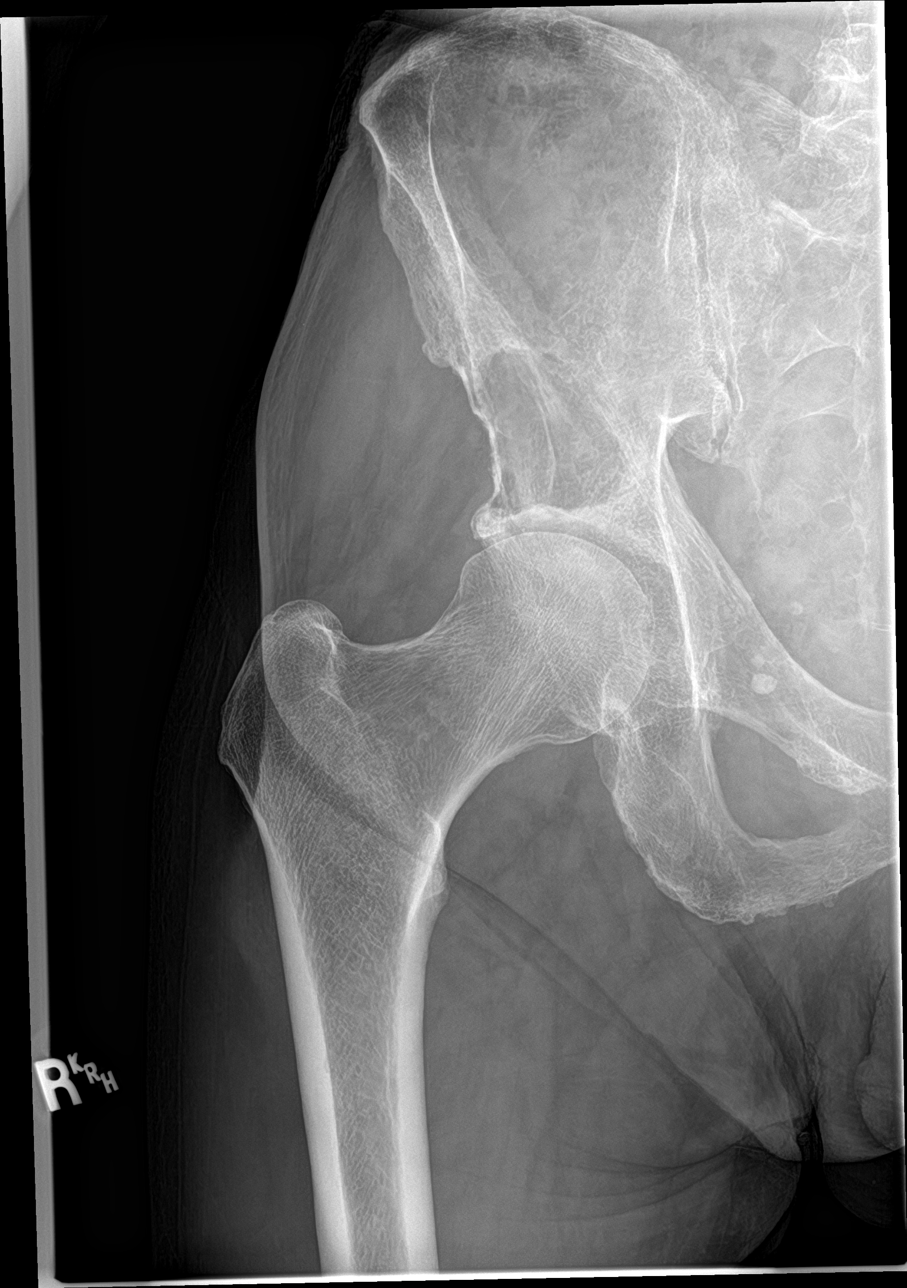

[hip lat]
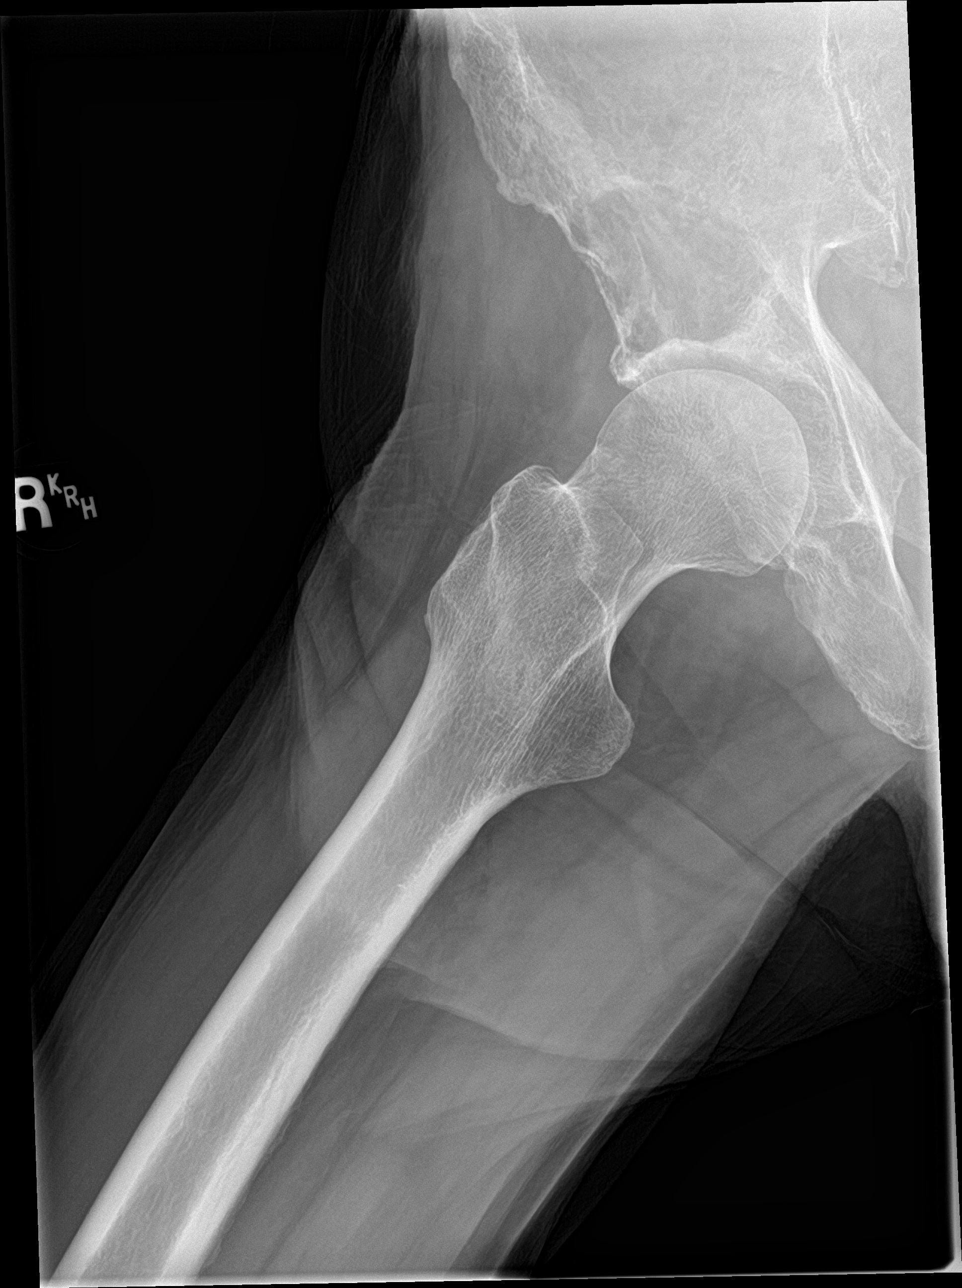

[Series 3: pelvis ap · 0.14mm/px · 2 of 2 slices shown]
[im 1/2]
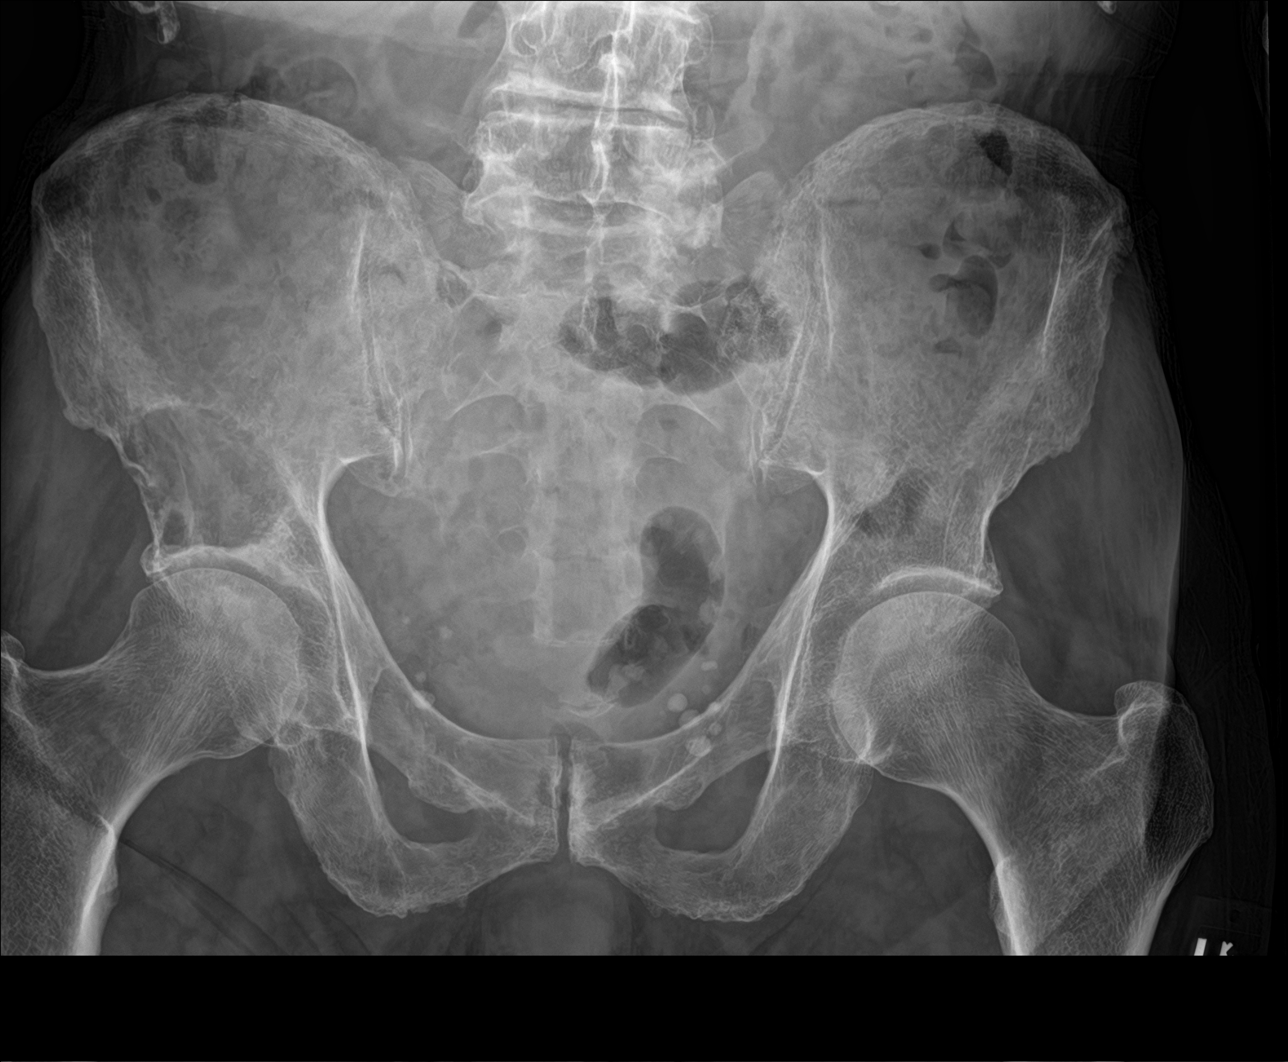
[im 2/2]
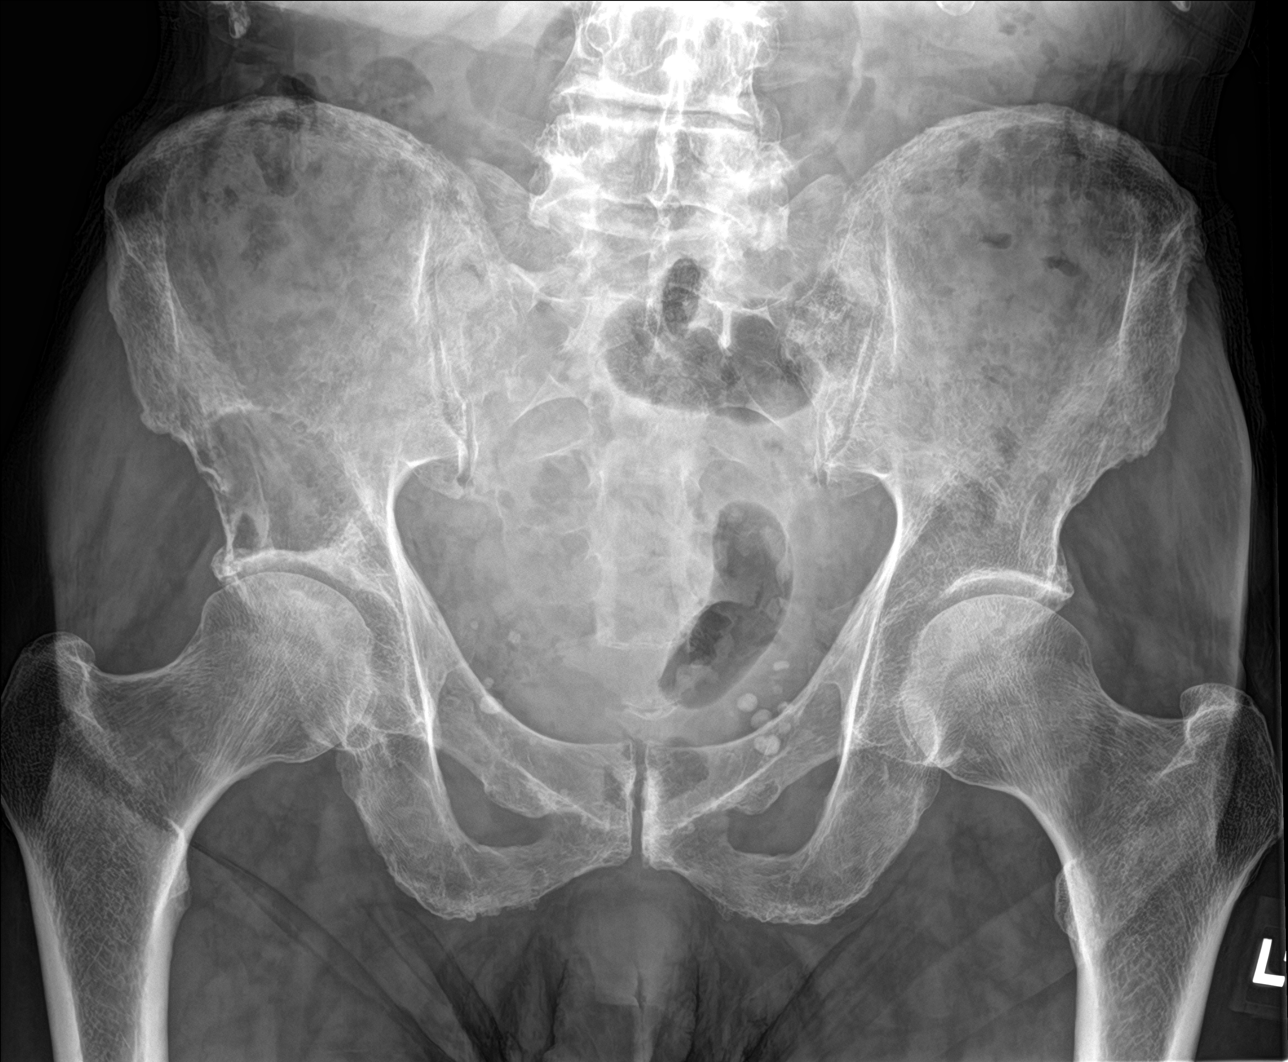

[4 of 4 positions shown; findings below may reference images not displayed]

FINDINGS: There is no evidence of hip fracture or dislocation. There is no
evidence of arthropathy. However, large lytic lesion is seen
superior to acetabulum consistent with multiple myeloma.
IMPRESSION: Large lytic lesion seen superior to right acetabulum consistent with
multiple myeloma. These results will be called to the ordering
clinician or representative by the Radiologist Assistant, and
communication documented in the PACS or zVision Dashboard.

## 2017-12-25 ENCOUNTER — Inpatient Hospital Stay: Payer: Medicare HMO

## 2017-12-25 ENCOUNTER — Encounter: Payer: Self-pay | Admitting: Family

## 2017-12-25 ENCOUNTER — Inpatient Hospital Stay (HOSPITAL_BASED_OUTPATIENT_CLINIC_OR_DEPARTMENT_OTHER): Payer: Medicare HMO | Admitting: Family

## 2017-12-25 ENCOUNTER — Other Ambulatory Visit: Payer: Self-pay | Admitting: Family

## 2017-12-25 ENCOUNTER — Other Ambulatory Visit: Payer: Self-pay

## 2017-12-25 VITALS — BP 153/67 | HR 77 | Temp 98.0°F | Resp 18 | Wt 148.0 lb

## 2017-12-25 DIAGNOSIS — Z79899 Other long term (current) drug therapy: Secondary | ICD-10-CM | POA: Diagnosis not present

## 2017-12-25 DIAGNOSIS — C9002 Multiple myeloma in relapse: Secondary | ICD-10-CM | POA: Diagnosis not present

## 2017-12-25 DIAGNOSIS — G620 Drug-induced polyneuropathy: Secondary | ICD-10-CM | POA: Diagnosis not present

## 2017-12-25 DIAGNOSIS — C9001 Multiple myeloma in remission: Secondary | ICD-10-CM

## 2017-12-25 DIAGNOSIS — Z923 Personal history of irradiation: Secondary | ICD-10-CM | POA: Diagnosis not present

## 2017-12-25 DIAGNOSIS — D508 Other iron deficiency anemias: Secondary | ICD-10-CM

## 2017-12-25 DIAGNOSIS — C9000 Multiple myeloma not having achieved remission: Secondary | ICD-10-CM

## 2017-12-25 DIAGNOSIS — Z5111 Encounter for antineoplastic chemotherapy: Secondary | ICD-10-CM | POA: Diagnosis not present

## 2017-12-25 DIAGNOSIS — Z5112 Encounter for antineoplastic immunotherapy: Secondary | ICD-10-CM | POA: Diagnosis not present

## 2017-12-25 LAB — CMP (CANCER CENTER ONLY)
ALBUMIN: 3.3 g/dL — AB (ref 3.5–5.0)
ALT: 25 U/L (ref 10–47)
ANION GAP: 5 (ref 5–15)
AST: 25 U/L (ref 11–38)
Alkaline Phosphatase: 86 U/L — ABNORMAL HIGH (ref 26–84)
BUN: 27 mg/dL — AB (ref 7–22)
CALCIUM: 9 mg/dL (ref 8.0–10.3)
CO2: 24 mmol/L (ref 18–33)
Chloride: 111 mmol/L — ABNORMAL HIGH (ref 98–108)
Creatinine: 1.5 mg/dL — ABNORMAL HIGH (ref 0.60–1.20)
GLUCOSE: 134 mg/dL — AB (ref 73–118)
Potassium: 4.2 mmol/L (ref 3.3–4.7)
SODIUM: 140 mmol/L (ref 128–145)
TOTAL PROTEIN: 6.3 g/dL — AB (ref 6.4–8.1)
Total Bilirubin: 1.1 mg/dL (ref 0.2–1.6)

## 2017-12-25 LAB — CBC WITH DIFFERENTIAL (CANCER CENTER ONLY)
BASOS ABS: 0.1 10*3/uL (ref 0.0–0.1)
BASOS PCT: 1 %
EOS PCT: 5 %
Eosinophils Absolute: 0.3 10*3/uL (ref 0.0–0.5)
HCT: 30 % — ABNORMAL LOW (ref 38.7–49.9)
Hemoglobin: 10.4 g/dL — ABNORMAL LOW (ref 13.0–17.1)
LYMPHS PCT: 6 %
Lymphs Abs: 0.4 10*3/uL — ABNORMAL LOW (ref 0.9–3.3)
MCH: 32.1 pg (ref 28.0–33.4)
MCHC: 34.7 g/dL (ref 32.0–35.9)
MCV: 92.6 fL (ref 82.0–98.0)
Monocytes Absolute: 0.6 10*3/uL (ref 0.1–0.9)
Monocytes Relative: 11 %
Neutro Abs: 4.4 10*3/uL (ref 1.5–6.5)
Neutrophils Relative %: 77 %
PLATELETS: 176 10*3/uL (ref 145–400)
RBC: 3.24 MIL/uL — AB (ref 4.20–5.70)
RDW: 13.7 % (ref 11.1–15.7)
WBC Count: 5.7 10*3/uL (ref 4.0–10.0)

## 2017-12-25 LAB — LACTATE DEHYDROGENASE: LDH: 210 U/L (ref 125–245)

## 2017-12-25 MED ORDER — DEXTROSE 5 % IV SOLN
60.0000 mg | Freq: Once | INTRAVENOUS | Status: AC
  Administered 2017-12-25: 60 mg via INTRAVENOUS
  Filled 2017-12-25: qty 30

## 2017-12-25 MED ORDER — SODIUM CHLORIDE 0.9 % IV SOLN
300.0000 mg/m2 | Freq: Once | INTRAVENOUS | Status: AC
  Administered 2017-12-25: 540 mg via INTRAVENOUS
  Filled 2017-12-25: qty 27

## 2017-12-25 MED ORDER — SODIUM CHLORIDE 0.9 % IV SOLN
Freq: Once | INTRAVENOUS | Status: AC
  Administered 2017-12-25: 15:00:00 via INTRAVENOUS

## 2017-12-25 MED ORDER — DEXAMETHASONE SODIUM PHOSPHATE 10 MG/ML IJ SOLN
INTRAMUSCULAR | Status: AC
  Filled 2017-12-25: qty 1

## 2017-12-25 MED ORDER — DEXAMETHASONE SODIUM PHOSPHATE 10 MG/ML IJ SOLN
10.0000 mg | Freq: Once | INTRAMUSCULAR | Status: AC
  Administered 2017-12-25: 10 mg via INTRAVENOUS

## 2017-12-25 MED ORDER — PALONOSETRON HCL INJECTION 0.25 MG/5ML
0.2500 mg | Freq: Once | INTRAVENOUS | Status: AC
  Administered 2017-12-25: 0.25 mg via INTRAVENOUS

## 2017-12-25 MED ORDER — PALONOSETRON HCL INJECTION 0.25 MG/5ML
INTRAVENOUS | Status: AC
Start: 2017-12-25 — End: ?
  Filled 2017-12-25: qty 5

## 2017-12-25 NOTE — Progress Notes (Signed)
Hematology and Oncology Follow Up Visit  Christian Burns [id number] 1930-02-13 84 y.o. 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 He is doing well and will be having the Mohs procedure to his left cheek next week.  His M-spike earlier this month was 0.5, lambda light chain 4.50 mg/dL and IgG 783.  No fever, chills, n/v, cough, rash, dizziness, SOB, chest pain, palpitations, abdominal pain or changes in bowel or bladder habits.  No lymphadenopathy found on exam.  No bleeding, bruising or petechiae.  No swelling or tenderness in his extremities at this time. 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 No falls or syncopal episodes.  He has maintained a good appetite and is staying well hydrated. His weight is stable.   ECOG Performance Status: 1 - Symptomatic but completely ambulatory  Medications:  Allergies as of 12/25/2017      Reactions   Sulfa Antibiotics Itching   Sulfasalazine Itching      Medication List        Accurate as of 12/25/17  2:16 PM. Always use your most recent med list.          amLODipine-benazepril 5-10 MG capsule Commonly known as:  LOTREL TAKE 1 CAPSULE BY MOUTH DAILY   MULTIVITAL PO Take 1 tablet by mouth every morning.   pyridOXINE 100 MG tablet Commonly known as:  VITAMIN B-6 Take 100 mg by mouth daily.   VITAMIN B 12 PO Take 1,000 mcg by mouth every morning.     Vitamin D (Ergocalciferol) 50000 units Caps capsule Commonly known as:  DRISDOL Take 50,000 Units by mouth every 7 (seven) days. Sundays       Allergies:  Allergies  Allergen Reactions  . Sulfa Antibiotics Itching  . Sulfasalazine Itching    Past Medical History, Surgical history, Social history, and Family History were reviewed and updated.  Review of Systems: All other 10 point review of systems is negative.   Physical Exam:  weight is 148 lb (67.1 kg). His oral temperature is 98 F (36.7 C). His blood pressure is 153/67 (abnormal) and his pulse is 77. His respiration is 18 and oxygen saturation is 99%.   Wt Readings from Last 3 Encounters:  12/25/17 148 lb (67.1 kg)  12/11/17 147 lb (66.7 kg)  11/27/17 144 lb (65.3 kg)    Ocular: Sclerae unicteric, pupils equal, round and reactive to light Ear-nose-throat: Oropharynx clear, dentition fair Lymphatic: No cervical, supraclavicular or axillary adenopathy Lungs no rales or rhonchi, good excursion bilaterally Heart regular rate and rhythm, no murmur appreciated Abd soft, nontender, positive bowel sounds, no liver or spleen tip palpated on exam, no fluid wave  MSK no focal spinal tenderness, no joint edema Neuro: non-focal, well-oriented, appropriate affect Breasts: Deferred   Lab Results  Component Value Date   WBC 5.7 12/25/2017   HGB 10.4 (L) 12/25/2017   HCT 30.0 (L) 12/25/2017   MCV 92.6 12/25/2017  PLT 176 12/25/2017   Lab Results  Component Value Date   FERRITIN 130 12/11/2017   IRON 104 12/11/2017   TIBC 231 12/11/2017   UIBC 128 12/11/2017   IRONPCTSAT 45 12/11/2017   Lab Results  Component Value Date   RBC 3.24 (L) 12/25/2017   Lab Results  Component Value Date   KPAFRELGTCHN 19.5 (H) 12/11/2017   LAMBDASER 45.0 (H) 12/11/2017   KAPLAMBRATIO 0.43 12/11/2017   Lab Results  Component Value Date   IGGSERUM 779 12/11/2017   IGA 63 12/11/2017   IGMSERUM 21 12/11/2017   Lab Results  Component  Value Date   TOTALPROTELP 5.6 (L) 12/11/2017   ALBUMINELP 3.4 12/11/2017   A1GS 0.2 12/11/2017   A2GS 0.7 12/11/2017   BETS 0.7 12/11/2017   BETA2SER 0.3 07/28/2015   GAMS 0.6 12/11/2017   MSPIKE 0.5 (H) 12/11/2017   SPEI Comment 11/27/2017     Chemistry      Component Value Date/Time   NA 140 12/25/2017 1323   NA 146 (H) 08/07/2017 1153   NA 141 01/09/2017 1004   K 4.2 12/25/2017 1323   K 4.6 08/07/2017 1153   K 4.4 01/09/2017 1004   CL 111 (H) 12/25/2017 1323   CL 108 08/07/2017 1153   CO2 24 12/25/2017 1323   CO2 26 08/07/2017 1153   CO2 22 01/09/2017 1004   BUN 27 (H) 12/25/2017 1323   BUN 24 (H) 08/07/2017 1153   BUN 23.9 01/09/2017 1004   CREATININE 1.50 (H) 12/25/2017 1323   CREATININE 1.5 (H) 08/07/2017 1153   CREATININE 1.5 (H) 01/09/2017 1004      Component Value Date/Time   CALCIUM 9.0 12/25/2017 1323   CALCIUM 8.8 08/07/2017 1153   CALCIUM 8.8 01/09/2017 1004   ALKPHOS 86 (H) 12/25/2017 1323   ALKPHOS 97 (H) 08/07/2017 1153   ALKPHOS 80 01/09/2017 1004   AST 25 12/25/2017 1323   AST 18 01/09/2017 1004   ALT 25 12/25/2017 1323   ALT 20 08/07/2017 1153   ALT 12 01/09/2017 1004   BILITOT 1.1 12/25/2017 1323   BILITOT 0.92 01/09/2017 1004      Impression and Plan: Christian Burns is a very pleasant 84 yo gentleman with relapsed IgG lambda myeloma. He continues to do well with treatment.  We will proceed with treatment today as planned and see him back in another 2 weeks for follow-up. They will contact our office with any questions or concerns. We can certainly see him sooner if need be.    Laverna Peace, NP 5/16/20192:16 PM

## 2017-12-25 NOTE — Patient Instructions (Signed)
[facility] Discharge Instructions for Patients Receiving Chemotherapy  Today you received the following chemotherapy agents:  Kyprolis & Cytoxan  To help prevent nausea and vomiting after your treatment, we encourage you to take your nausea medication as prescribed.    If you develop nausea and vomiting that is not controlled by your nausea medication, call the clinic.   BELOW ARE SYMPTOMS THAT SHOULD BE REPORTED IMMEDIATELY:  *FEVER GREATER THAN 100.5 F  *CHILLS WITH OR WITHOUT FEVER  NAUSEA AND VOMITING THAT IS NOT CONTROLLED WITH YOUR NAUSEA MEDICATION  *UNUSUAL SHORTNESS OF BREATH  *UNUSUAL BRUISING OR BLEEDING  TENDERNESS IN MOUTH AND THROAT WITH OR WITHOUT PRESENCE OF ULCERS  *URINARY PROBLEMS  *BOWEL PROBLEMS  UNUSUAL RASH Items with * indicate a potential emergency and should be followed up as soon as possible.  Feel free to call the clinic should you have any questions or concerns. The clinic phone number is [phone].  Please show the CHEMO ALERT CARD at check-in to the Emergency Department and triage nurse.   

## 2017-12-26 LAB — KAPPA/LAMBDA LIGHT CHAINS
KAPPA, LAMDA LIGHT CHAIN RATIO: 0.39 (ref 0.26–1.65)
Kappa free light chain: 20.4 mg/L — ABNORMAL HIGH (ref 3.3–19.4)
LAMDA FREE LIGHT CHAINS: 52.3 mg/L — AB (ref 5.7–26.3)

## 2017-12-26 LAB — IGG, IGA, IGM
IGA: 68 mg/dL (ref 61–437)
IGG (IMMUNOGLOBIN G), SERUM: 763 mg/dL (ref 700–1600)
IgM (Immunoglobulin M), Srm: 22 mg/dL (ref 15–143)

## 2017-12-30 DIAGNOSIS — C44319 Basal cell carcinoma of skin of other parts of face: Secondary | ICD-10-CM | POA: Diagnosis not present

## 2017-12-30 LAB — PROTEIN ELECTROPHORESIS, SERUM, WITH REFLEX
A/G Ratio: 1.5 (ref 0.7–1.7)
ALPHA-1-GLOBULIN: 0.2 g/dL (ref 0.0–0.4)
ALPHA-2-GLOBULIN: 0.8 g/dL (ref 0.4–1.0)
Albumin ELP: 3.5 g/dL (ref 2.9–4.4)
Beta Globulin: 0.8 g/dL (ref 0.7–1.3)
GAMMA GLOBULIN: 0.7 g/dL (ref 0.4–1.8)
Globulin, Total: 2.4 g/dL (ref 2.2–3.9)
M-SPIKE, %: 0.4 g/dL — AB
SPEP Interpretation: 0
Total Protein ELP: 5.9 g/dL — ABNORMAL LOW (ref 6.0–8.5)

## 2017-12-30 LAB — IMMUNOFIXATION REFLEX, SERUM
IgA: 66 mg/dL (ref 61–437)
IgG (Immunoglobin G), Serum: 818 mg/dL (ref 700–1600)
IgM (Immunoglobulin M), Srm: 22 mg/dL (ref 15–143)

## 2018-01-01 ENCOUNTER — Other Ambulatory Visit: Payer: Self-pay | Admitting: Hematology & Oncology

## 2018-01-01 DIAGNOSIS — I10 Essential (primary) hypertension: Secondary | ICD-10-CM

## 2018-01-08 ENCOUNTER — Inpatient Hospital Stay: Payer: Medicare HMO

## 2018-01-08 ENCOUNTER — Inpatient Hospital Stay (HOSPITAL_BASED_OUTPATIENT_CLINIC_OR_DEPARTMENT_OTHER): Payer: Medicare HMO | Admitting: Family

## 2018-01-08 VITALS — BP 136/72 | HR 73 | Temp 98.4°F

## 2018-01-08 DIAGNOSIS — C9002 Multiple myeloma in relapse: Secondary | ICD-10-CM | POA: Diagnosis not present

## 2018-01-08 DIAGNOSIS — C9000 Multiple myeloma not having achieved remission: Secondary | ICD-10-CM

## 2018-01-08 DIAGNOSIS — Z79899 Other long term (current) drug therapy: Secondary | ICD-10-CM

## 2018-01-08 DIAGNOSIS — G620 Drug-induced polyneuropathy: Secondary | ICD-10-CM | POA: Diagnosis not present

## 2018-01-08 DIAGNOSIS — Z923 Personal history of irradiation: Secondary | ICD-10-CM

## 2018-01-08 DIAGNOSIS — D508 Other iron deficiency anemias: Secondary | ICD-10-CM | POA: Diagnosis not present

## 2018-01-08 DIAGNOSIS — Z5111 Encounter for antineoplastic chemotherapy: Secondary | ICD-10-CM | POA: Diagnosis not present

## 2018-01-08 DIAGNOSIS — Z5112 Encounter for antineoplastic immunotherapy: Secondary | ICD-10-CM | POA: Diagnosis not present

## 2018-01-08 LAB — CBC WITH DIFFERENTIAL (CANCER CENTER ONLY)
BASOS ABS: 0.1 10*3/uL (ref 0.0–0.1)
BASOS PCT: 1 %
EOS ABS: 0.3 10*3/uL (ref 0.0–0.5)
EOS PCT: 4 %
HCT: 30.6 % — ABNORMAL LOW (ref 38.7–49.9)
Hemoglobin: 10.6 g/dL — ABNORMAL LOW (ref 13.0–17.1)
LYMPHS PCT: 8 %
Lymphs Abs: 0.5 10*3/uL — ABNORMAL LOW (ref 0.9–3.3)
MCH: 32.1 pg (ref 28.0–33.4)
MCHC: 34.6 g/dL (ref 32.0–35.9)
MCV: 92.7 fL (ref 82.0–98.0)
Monocytes Absolute: 0.8 10*3/uL (ref 0.1–0.9)
Monocytes Relative: 13 %
Neutro Abs: 4.4 10*3/uL (ref 1.5–6.5)
Neutrophils Relative %: 74 %
PLATELETS: 183 10*3/uL (ref 145–400)
RBC: 3.3 MIL/uL — AB (ref 4.20–5.70)
RDW: 14.1 % (ref 11.1–15.7)
WBC: 5.9 10*3/uL (ref 4.0–10.0)

## 2018-01-08 LAB — CMP (CANCER CENTER ONLY)
ALBUMIN: 3.4 g/dL — AB (ref 3.5–5.0)
ALT: 30 U/L (ref 10–47)
AST: 24 U/L (ref 11–38)
Alkaline Phosphatase: 88 U/L — ABNORMAL HIGH (ref 26–84)
Anion gap: 11 (ref 5–15)
BUN: 36 mg/dL — AB (ref 7–22)
CALCIUM: 9.1 mg/dL (ref 8.0–10.3)
CO2: 22 mmol/L (ref 18–33)
Chloride: 110 mmol/L — ABNORMAL HIGH (ref 98–108)
Creatinine: 1.8 mg/dL — ABNORMAL HIGH (ref 0.60–1.20)
GLUCOSE: 93 mg/dL (ref 73–118)
Potassium: 4.9 mmol/L — ABNORMAL HIGH (ref 3.3–4.7)
SODIUM: 143 mmol/L (ref 128–145)
Total Bilirubin: 1.2 mg/dL (ref 0.2–1.6)
Total Protein: 6.6 g/dL (ref 6.4–8.1)

## 2018-01-08 LAB — LACTATE DEHYDROGENASE: LDH: 203 U/L (ref 125–245)

## 2018-01-08 MED ORDER — PALONOSETRON HCL INJECTION 0.25 MG/5ML
0.2500 mg | Freq: Once | INTRAVENOUS | Status: AC
  Administered 2018-01-08: 0.25 mg via INTRAVENOUS

## 2018-01-08 MED ORDER — CYCLOPHOSPHAMIDE CHEMO INJECTION 1 GM
300.0000 mg/m2 | Freq: Once | INTRAMUSCULAR | Status: AC
  Administered 2018-01-08: 540 mg via INTRAVENOUS
  Filled 2018-01-08: qty 27

## 2018-01-08 MED ORDER — DEXTROSE 5 % IV SOLN
60.0000 mg | Freq: Once | INTRAVENOUS | Status: AC
  Administered 2018-01-08: 60 mg via INTRAVENOUS
  Filled 2018-01-08: qty 30

## 2018-01-08 MED ORDER — DEXAMETHASONE SODIUM PHOSPHATE 10 MG/ML IJ SOLN
10.0000 mg | Freq: Once | INTRAMUSCULAR | Status: AC
  Administered 2018-01-08: 10 mg via INTRAVENOUS

## 2018-01-08 MED ORDER — SODIUM CHLORIDE 0.9 % IV SOLN
Freq: Once | INTRAVENOUS | Status: AC
  Administered 2018-01-08: 14:00:00 via INTRAVENOUS

## 2018-01-08 NOTE — Progress Notes (Signed)
Hematology and Oncology Follow Up Visit  Christian Burns [id number] 11-26-1929 84 y.o. 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 is here today with his grandson for follow-up. He continues to do well and has no compliants at this time. M-spike earlier this month was 0.4,  IgG 763 mg/dL and lambda light chain 2.04 mg/dL.  He had the Moh's surgery on his left cheek and is healing nicely. His incision site is clean, dry and intact.  He has had no fever, chills, n/v, cough, rash, dizziness, SOB, chest pain, palpitations, abdominal pain or changes in bowel or bladder habits.  No lymphadenopathy noted on exam.  The numbness and tingling in the hands and feet is unchanged. No swelling or tenderness in his extremities. No c/o pain.  He has maintained a good appetite and is does his best to stay well hydrated. His weight is stable.   ECOG Performance Status: 1 - Symptomatic but completely ambulatory  Medications:  Allergies as of 01/08/2018      Reactions   Sulfa Antibiotics Itching   Sulfasalazine Itching      Medication List        Accurate as of 01/08/18  1:19 PM. Always use your most recent med list.          amLODipine-benazepril 5-10 MG capsule Commonly known as:  LOTREL TAKE 1 CAPSULE BY MOUTH DAILY   amLODipine-benazepril 5-10 MG capsule Commonly known as:  LOTREL TAKE 1 CAPSULE BY MOUTH DAILY   MULTIVITAL PO Take 1 tablet by mouth every morning.   pyridOXINE 100 MG  tablet Commonly known as:  VITAMIN B-6 Take 100 mg by mouth daily.   VITAMIN B 12 PO Take 1,000 mcg by mouth every morning.   Vitamin D (Ergocalciferol) 50000 units Caps capsule Commonly known as:  DRISDOL Take 50,000 Units by mouth every 7 (seven) days. Sundays       Allergies:  Allergies  Allergen Reactions  . Sulfa Antibiotics Itching  . Sulfasalazine Itching    Past Medical History, Surgical history, Social history, and Family History were reviewed and updated.  Review of Systems: All other 10 point review of systems is negative.   Physical Exam:  vitals were not taken for this visit.   Wt Readings from Last 3 Encounters:  12/25/17 148 lb (67.1 kg)  12/11/17 147 lb (66.7 kg)  11/27/17 144 lb (65.3 kg)    Ocular: Sclerae unicteric, pupils equal, round and reactive to light Ear-nose-throat: Oropharynx clear, dentition fair Lymphatic: No cervical, supraclavicular or axillary adenopathy Lungs no rales or rhonchi, good excursion bilaterally Heart regular rate and rhythm, no murmur appreciated Abd soft, nontender, positive bowel sounds, no liver or spleen tip palpated on exam, no fluid wave MSK no focal spinal tenderness, no joint edema Neuro: non-focal, well-oriented, appropriate affect Breasts: Deferred   Lab Results  Component Value Date   WBC 5.9 01/08/2018   HGB 10.6 (L) 01/08/2018   HCT 30.6 (L) 01/08/2018   MCV 92.7 01/08/2018   PLT  183 01/08/2018   Lab Results  Component Value Date   FERRITIN 130 12/11/2017   IRON 104 12/11/2017   TIBC 231 12/11/2017   UIBC 128 12/11/2017   IRONPCTSAT 45 12/11/2017   Lab Results  Component Value Date   RBC 3.30 (L) 01/08/2018   Lab Results  Component Value Date   KPAFRELGTCHN 20.4 (H) 12/25/2017   LAMBDASER 52.3 (H) 12/25/2017   KAPLAMBRATIO 0.39 12/25/2017   Lab Results  Component Value Date   IGGSERUM 763 12/25/2017   IGGSERUM 818 12/25/2017   IGA 68 12/25/2017   IGA 66 12/25/2017   IGMSERUM 22  12/25/2017   IGMSERUM 22 12/25/2017   Lab Results  Component Value Date   TOTALPROTELP 5.9 (L) 12/25/2017   ALBUMINELP 3.5 12/25/2017   A1GS 0.2 12/25/2017   A2GS 0.8 12/25/2017   BETS 0.8 12/25/2017   BETA2SER 0.3 07/28/2015   GAMS 0.7 12/25/2017   MSPIKE 0.4 (H) 12/25/2017   SPEI Comment 11/27/2017     Chemistry      Component Value Date/Time   NA 140 12/25/2017 1323   NA 146 (H) 08/07/2017 1153   NA 141 01/09/2017 1004   K 4.2 12/25/2017 1323   K 4.6 08/07/2017 1153   K 4.4 01/09/2017 1004   CL 111 (H) 12/25/2017 1323   CL 108 08/07/2017 1153   CO2 24 12/25/2017 1323   CO2 26 08/07/2017 1153   CO2 22 01/09/2017 1004   BUN 27 (H) 12/25/2017 1323   BUN 24 (H) 08/07/2017 1153   BUN 23.9 01/09/2017 1004   CREATININE 1.50 (H) 12/25/2017 1323   CREATININE 1.5 (H) 08/07/2017 1153   CREATININE 1.5 (H) 01/09/2017 1004      Component Value Date/Time   CALCIUM 9.0 12/25/2017 1323   CALCIUM 8.8 08/07/2017 1153   CALCIUM 8.8 01/09/2017 1004   ALKPHOS 86 (H) 12/25/2017 1323   ALKPHOS 97 (H) 08/07/2017 1153   ALKPHOS 80 01/09/2017 1004   AST 25 12/25/2017 1323   AST 18 01/09/2017 1004   ALT 25 12/25/2017 1323   ALT 20 08/07/2017 1153   ALT 12 01/09/2017 1004   BILITOT 1.1 12/25/2017 1323   BILITOT 0.92 01/09/2017 1004      Impression and Plan: Mr. Holsworth is a very pleasant 84 yo caucasian gentleman with relapsed IgG lambda myeloma. He continues to tolerate treatment nicely and has no complaints at this time.  We will proceed with treatment today as planned.  We will see him in another 2 weeks for follow-up.  They will contact our office with any questions or concerns. We can certainly see him sooner if need be.   Laverna Peace, NP 5/30/20191:19 PM

## 2018-01-22 ENCOUNTER — Inpatient Hospital Stay: Payer: Medicare HMO | Attending: Hematology & Oncology | Admitting: Hematology & Oncology

## 2018-01-22 ENCOUNTER — Inpatient Hospital Stay: Payer: Medicare HMO

## 2018-01-22 ENCOUNTER — Encounter: Payer: Self-pay | Admitting: Hematology & Oncology

## 2018-01-22 ENCOUNTER — Other Ambulatory Visit: Payer: Self-pay

## 2018-01-22 VITALS — BP 142/72 | HR 76 | Temp 98.1°F | Resp 16 | Wt 149.0 lb

## 2018-01-22 DIAGNOSIS — D631 Anemia in chronic kidney disease: Secondary | ICD-10-CM | POA: Insufficient documentation

## 2018-01-22 DIAGNOSIS — Z5112 Encounter for antineoplastic immunotherapy: Secondary | ICD-10-CM | POA: Insufficient documentation

## 2018-01-22 DIAGNOSIS — Z5111 Encounter for antineoplastic chemotherapy: Secondary | ICD-10-CM | POA: Insufficient documentation

## 2018-01-22 DIAGNOSIS — C9001 Multiple myeloma in remission: Secondary | ICD-10-CM

## 2018-01-22 DIAGNOSIS — C9002 Multiple myeloma in relapse: Secondary | ICD-10-CM

## 2018-01-22 DIAGNOSIS — C9000 Multiple myeloma not having achieved remission: Secondary | ICD-10-CM

## 2018-01-22 DIAGNOSIS — Z79899 Other long term (current) drug therapy: Secondary | ICD-10-CM | POA: Diagnosis not present

## 2018-01-22 DIAGNOSIS — N289 Disorder of kidney and ureter, unspecified: Secondary | ICD-10-CM | POA: Insufficient documentation

## 2018-01-22 DIAGNOSIS — D508 Other iron deficiency anemias: Secondary | ICD-10-CM

## 2018-01-22 LAB — CBC WITH DIFFERENTIAL (CANCER CENTER ONLY)
Basophils Absolute: 0.1 10*3/uL (ref 0.0–0.1)
Basophils Relative: 1 %
EOS ABS: 0.3 10*3/uL (ref 0.0–0.5)
Eosinophils Relative: 5 %
HEMATOCRIT: 27.9 % — AB (ref 38.7–49.9)
HEMOGLOBIN: 9.5 g/dL — AB (ref 13.0–17.1)
LYMPHS ABS: 0.4 10*3/uL — AB (ref 0.9–3.3)
Lymphocytes Relative: 6 %
MCH: 31.7 pg (ref 28.0–33.4)
MCHC: 34.1 g/dL (ref 32.0–35.9)
MCV: 93 fL (ref 82.0–98.0)
MONO ABS: 0.6 10*3/uL (ref 0.1–0.9)
Monocytes Relative: 9 %
NEUTROS PCT: 79 %
Neutro Abs: 4.7 10*3/uL (ref 1.5–6.5)
Platelet Count: 178 10*3/uL (ref 145–400)
RBC: 3 MIL/uL — ABNORMAL LOW (ref 4.20–5.70)
RDW: 14.3 % (ref 11.1–15.7)
WBC Count: 5.9 10*3/uL (ref 4.0–10.0)

## 2018-01-22 LAB — CMP (CANCER CENTER ONLY)
ALK PHOS: 75 U/L (ref 26–84)
ALT: 20 U/L (ref 10–47)
AST: 23 U/L (ref 11–38)
Albumin: 3 g/dL — ABNORMAL LOW (ref 3.5–5.0)
Anion gap: 9 (ref 5–15)
BILIRUBIN TOTAL: 0.9 mg/dL (ref 0.2–1.6)
BUN: 26 mg/dL — ABNORMAL HIGH (ref 7–22)
CHLORIDE: 110 mmol/L — AB (ref 98–108)
CO2: 23 mmol/L (ref 18–33)
Calcium: 8.6 mg/dL (ref 8.0–10.3)
Creatinine: 1.5 mg/dL — ABNORMAL HIGH (ref 0.60–1.20)
Glucose, Bld: 110 mg/dL (ref 73–118)
Potassium: 4.7 mmol/L (ref 3.3–4.7)
SODIUM: 142 mmol/L (ref 128–145)
Total Protein: 6 g/dL — ABNORMAL LOW (ref 6.4–8.1)

## 2018-01-22 LAB — LACTATE DEHYDROGENASE: LDH: 196 U/L (ref 125–245)

## 2018-01-22 MED ORDER — DEXAMETHASONE SODIUM PHOSPHATE 10 MG/ML IJ SOLN
INTRAMUSCULAR | Status: AC
  Filled 2018-01-22: qty 1

## 2018-01-22 MED ORDER — DEXAMETHASONE SODIUM PHOSPHATE 10 MG/ML IJ SOLN
10.0000 mg | Freq: Once | INTRAMUSCULAR | Status: AC
  Administered 2018-01-22: 10 mg via INTRAVENOUS

## 2018-01-22 MED ORDER — DARBEPOETIN ALFA 300 MCG/0.6ML IJ SOSY
300.0000 ug | PREFILLED_SYRINGE | INTRAMUSCULAR | Status: DC
  Administered 2018-01-22: 300 ug via SUBCUTANEOUS

## 2018-01-22 MED ORDER — SODIUM CHLORIDE 0.9 % IV SOLN
300.0000 mg/m2 | Freq: Once | INTRAVENOUS | Status: AC
  Administered 2018-01-22: 540 mg via INTRAVENOUS
  Filled 2018-01-22: qty 27

## 2018-01-22 MED ORDER — SODIUM CHLORIDE 0.9 % IV SOLN
Freq: Once | INTRAVENOUS | Status: AC
  Administered 2018-01-22: 15:00:00 via INTRAVENOUS

## 2018-01-22 MED ORDER — DEXTROSE 5 % IV SOLN
60.0000 mg | Freq: Once | INTRAVENOUS | Status: AC
  Administered 2018-01-22: 60 mg via INTRAVENOUS
  Filled 2018-01-22: qty 30

## 2018-01-22 MED ORDER — DARBEPOETIN ALFA 300 MCG/0.6ML IJ SOSY
PREFILLED_SYRINGE | INTRAMUSCULAR | Status: AC
  Filled 2018-01-22: qty 0.6

## 2018-01-22 MED ORDER — PALONOSETRON HCL INJECTION 0.25 MG/5ML
0.2500 mg | Freq: Once | INTRAVENOUS | Status: AC
  Administered 2018-01-22: 0.25 mg via INTRAVENOUS

## 2018-01-22 NOTE — Progress Notes (Signed)
Hematology and Oncology Follow Up Visit  Christian Burns [id number] 12/13/1929 84 y.o. 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 He continues to do well and has no compliants at this time.  His M-spike earlier this month was 0.4,  IgG 763 mg/dL and lambda light chain 2.04 mg/dL.   He has had no fever, chills, n/v, cough, rash, dizziness, SOB, chest pain, palpitations, abdominal pain or changes in bowel or bladder habits.   The numbness and tingling in the hands and feet is unchanged. No swelling or tenderness in his extremities. No c/o pain.   He has maintained a good appetite and is does his best to stay well hydrated. His weight is stable.   ECOG Performance Status: 1 - Symptomatic but completely ambulatory  Medications:  Allergies as of 01/22/2018      Reactions   Sulfa Antibiotics Itching   Sulfasalazine Itching      Medication List        Accurate as of 01/22/18  2:23 PM. Always use your most recent med list.          amLODipine-benazepril 5-10 MG capsule Commonly known as:  LOTREL TAKE 1 CAPSULE BY MOUTH DAILY   MULTIVITAL PO Take 1 tablet by mouth every morning.   pyridOXINE 100 MG tablet Commonly known as:  VITAMIN B-6 Take 100 mg by mouth daily.   VITAMIN B 12 PO Take 1,000 mcg by mouth every morning.   Vitamin D (Ergocalciferol) 50000 units Caps capsule Commonly  known as:  DRISDOL Take 50,000 Units by mouth every 7 (seven) days. Sundays       Allergies:  Allergies  Allergen Reactions  . Sulfa Antibiotics Itching  . Sulfasalazine Itching    Past Medical History, Surgical history, Social history, and Family History were reviewed and updated.  Review of Systems: All other 10 point review of systems is negative.   Physical Exam:  weight is 149 lb (67.6 kg). His oral temperature is 98.1 F (36.7 C). His blood pressure is 142/72 (abnormal) and his pulse is 76. His respiration is 16 and oxygen saturation is 100%.   Wt Readings from Last 3 Encounters:  01/22/18 149 lb (67.6 kg)  12/25/17 148 lb (67.1 kg)  12/11/17 147 lb (66.7 kg)    Ocular: Sclerae unicteric, pupils equal, round and reactive to light Ear-nose-throat: Oropharynx clear, dentition fair Lymphatic: No cervical, supraclavicular or axillary adenopathy Lungs no rales or rhonchi, good excursion bilaterally Heart regular rate and rhythm, no murmur appreciated Abd soft, nontender, positive bowel sounds, no liver or spleen tip palpated on exam, no fluid wave MSK no focal spinal tenderness, no joint edema Neuro: non-focal, well-oriented, appropriate affect Breasts: Deferred   Lab Results  Component Value Date   WBC 5.9 01/22/2018   HGB 9.5 (L) 01/22/2018   HCT 27.9 (L) 01/22/2018   MCV 93.0 01/22/2018   PLT 178 01/22/2018   Lab Results  Component Value Date   FERRITIN 130 12/11/2017   IRON 104 12/11/2017   TIBC 231 12/11/2017   UIBC 128 12/11/2017   IRONPCTSAT 45 12/11/2017   Lab Results  Component Value Date   RBC 3.00 (L) 01/22/2018   Lab Results  Component Value Date   KPAFRELGTCHN 20.4 (H) 12/25/2017   LAMBDASER 52.3 (H) 12/25/2017   KAPLAMBRATIO 0.39 12/25/2017   Lab Results  Component Value Date   IGGSERUM 763 12/25/2017   IGGSERUM 818 12/25/2017   IGA 68 12/25/2017   IGA 66 12/25/2017   IGMSERUM 22 12/25/2017   IGMSERUM 22 12/25/2017   Lab Results    Component Value Date   TOTALPROTELP 5.9 (L) 12/25/2017   ALBUMINELP 3.5 12/25/2017   A1GS 0.2 12/25/2017   A2GS 0.8 12/25/2017   BETS 0.8 12/25/2017   BETA2SER 0.3 07/28/2015   GAMS 0.7 12/25/2017   MSPIKE 0.4 (H) 12/25/2017   SPEI Comment 11/27/2017     Chemistry      Component Value Date/Time   NA 142 01/22/2018 1314   NA 146 (H) 08/07/2017 1153   NA 141 01/09/2017 1004   K 4.7 01/22/2018 1314   K 4.6 08/07/2017 1153   K 4.4 01/09/2017 1004   CL 110 (H) 01/22/2018 1314   CL 108 08/07/2017 1153   CO2 23 01/22/2018 1314   CO2 26 08/07/2017 1153   CO2 22 01/09/2017 1004   BUN 26 (H) 01/22/2018 1314   BUN 24 (H) 08/07/2017 1153   BUN 23.9 01/09/2017 1004   CREATININE 1.50 (H) 01/22/2018 1314   CREATININE 1.5 (H) 08/07/2017 1153   CREATININE 1.5 (H) 01/09/2017 1004      Component Value Date/Time   CALCIUM 8.6 01/22/2018 1314   CALCIUM 8.8 08/07/2017 1153   CALCIUM 8.8 01/09/2017 1004   ALKPHOS 75 01/22/2018 1314   ALKPHOS 97 (H) 08/07/2017 1153   ALKPHOS 80 01/09/2017 1004   AST 23 01/22/2018 1314   AST 18 01/09/2017 1004   ALT 20 01/22/2018 1314   ALT 20 08/07/2017 1153   ALT 12 01/09/2017 1004   BILITOT 0.9 01/22/2018 1314   BILITOT 0.92 01/09/2017 1004      Impression and Plan: Christian Burns is a very pleasant 84 yo caucasian gentleman with relapsed IgG lambda myeloma. He continues to tolerate treatment nicely and has no complaints at this time.   We will proceed with treatment today as planned.   We will go ahead and give him a dose of Aranesp today.  We will see him in another 2 weeks for follow-up.    They will contact our office with any questions or concerns. We can certainly see him sooner if need be.   [name], MD 6/13/20192:23 PM

## 2018-01-22 NOTE — Patient Instructions (Signed)
Cyclophosphamide injection What is this medicine? CYCLOPHOSPHAMIDE (sye kloe FOSS fa mide) is a chemotherapy drug. It slows the growth of cancer cells. This medicine is used to treat many types of cancer like lymphoma, myeloma, leukemia, breast cancer, and ovarian cancer, to name a few. This medicine may be used for other purposes; ask your health care provider or pharmacist if you have questions. COMMON BRAND NAME(S): Cytoxan, Neosar What should I tell my health care provider before I take this medicine? They need to know if you have any of these conditions: -blood disorders -history of other chemotherapy -infection -kidney disease -liver disease -recent or ongoing radiation therapy -tumors in the bone marrow -an unusual or allergic reaction to cyclophosphamide, other chemotherapy, other medicines, foods, dyes, or preservatives -pregnant or trying to get pregnant -breast-feeding How should I use this medicine? This drug is usually given as an injection into a vein or muscle or by infusion into a vein. It is administered in a hospital or clinic by a specially trained health care professional. Talk to your pediatrician regarding the use of this medicine in children. Special care may be needed. Overdosage: If you think you have taken too much of this medicine contact a poison control center or emergency room at once. NOTE: This medicine is only for you. Do not share this medicine with others. What if I miss a dose? It is important not to miss your dose. Call your doctor or health care professional if you are unable to keep an appointment. What may interact with this medicine? This medicine may interact with the following medications: -amiodarone -amphotericin B -azathioprine -certain antiviral medicines for HIV or AIDS such as protease inhibitors (e.g., indinavir, ritonavir) and zidovudine -certain blood pressure medications such as benazepril, captopril, enalapril, fosinopril,  lisinopril, moexipril, monopril, perindopril, quinapril, ramipril, trandolapril -certain cancer medications such as anthracyclines (e.g., daunorubicin, doxorubicin), busulfan, cytarabine, paclitaxel, pentostatin, tamoxifen, trastuzumab -certain diuretics such as chlorothiazide, chlorthalidone, hydrochlorothiazide, indapamide, metolazone -certain medicines that treat or prevent blood clots like warfarin -certain muscle relaxants such as succinylcholine -cyclosporine -etanercept -indomethacin -medicines to increase blood counts like filgrastim, pegfilgrastim, sargramostim -medicines used as general anesthesia -metronidazole -natalizumab This list may not describe all possible interactions. Give your health care provider a list of all the medicines, herbs, non-prescription drugs, or dietary supplements you use. Also tell them if you smoke, drink alcohol, or use illegal drugs. Some items may interact with your medicine. What should I watch for while using this medicine? Visit your doctor for checks on your progress. This drug may make you feel generally unwell. This is not uncommon, as chemotherapy can affect healthy cells as well as cancer cells. Report any side effects. Continue your course of treatment even though you feel ill unless your doctor tells you to stop. Drink water or other fluids as directed. Urinate often, even at night. In some cases, you may be given additional medicines to help with side effects. Follow all directions for their use. Call your doctor or health care professional for advice if you get a fever, chills or sore throat, or other symptoms of a cold or flu. Do not treat yourself. This drug decreases your body's ability to fight infections. Try to avoid being around people who are sick. This medicine may increase your risk to bruise or bleed. Call your doctor or health care professional if you notice any unusual bleeding. Be careful brushing and flossing your teeth or using a  toothpick because you may get an infection or bleed   more easily. If you have any dental work done, tell your dentist you are receiving this medicine. You may get drowsy or dizzy. Do not drive, use machinery, or do anything that needs mental alertness until you know how this medicine affects you. Do not become pregnant while taking this medicine or for 1 year after stopping it. Women should inform their doctor if they wish to become pregnant or think they might be pregnant. Men should not father a child while taking this medicine and for 4 months after stopping it. There is a potential for serious side effects to an unborn child. Talk to your health care professional or pharmacist for more information. Do not breast-feed an infant while taking this medicine. This medicine may interfere with the ability to have a child. This medicine has caused ovarian failure in some women. This medicine has caused reduced sperm counts in some men. You should talk with your doctor or health care professional if you are concerned about your fertility. If you are going to have surgery, tell your doctor or health care professional that you have taken this medicine. What side effects may I notice from receiving this medicine? Side effects that you should report to your doctor or health care professional as soon as possible: -allergic reactions like skin rash, itching or hives, swelling of the face, lips, or tongue -low blood counts - this medicine may decrease the number of white blood cells, red blood cells and platelets. You may be at increased risk for infections and bleeding. -signs of infection - fever or chills, cough, sore throat, pain or difficulty passing urine -signs of decreased platelets or bleeding - bruising, pinpoint red spots on the skin, black, tarry stools, blood in the urine -signs of decreased red blood cells - unusually weak or tired, fainting spells, lightheadedness -breathing problems -dark  urine -dizziness -palpitations -swelling of the ankles, feet, hands -trouble passing urine or change in the amount of urine -weight gain -yellowing of the eyes or skin Side effects that usually do not require medical attention (report to your doctor or health care professional if they continue or are bothersome): -changes in nail or skin color -hair loss -missed menstrual periods -mouth sores -nausea, vomiting This list may not describe all possible side effects. Call your doctor for medical advice about side effects. You may report side effects to FDA at 1-800-FDA-1088. Where should I keep my medicine? This drug is given in a hospital or clinic and will not be stored at home. NOTE: This sheet is a summary. It may not cover all possible information. If you have questions about this medicine, talk to your doctor, pharmacist, or health care provider.  2018 Elsevier/Gold Standard (2012-06-12 16:22:58) Carfilzomib injection What is this medicine? CARFILZOMIB (kar FILZ oh mib) targets a specific protein within cancer cells and stops the cancer cells from growing. It is used to treat multiple myeloma. This medicine may be used for other purposes; ask your health care provider or pharmacist if you have questions. COMMON BRAND NAME(S): KYPROLIS What should I tell my health care provider before I take this medicine? They need to know if you have any of these conditions: -heart disease -history of blood clots -irregular heartbeat -kidney disease -liver disease -lung or breathing disease -an unusual or allergic reaction to carfilzomib, or other medicines, foods, dyes, or preservatives -pregnant or trying to get pregnant -breast-feeding How should I use this medicine? This medicine is for injection or infusion into a vein. It is given  by a health care professional in a hospital or clinic setting. Talk to your pediatrician regarding the use of this medicine in children. Special care may be  needed. Overdosage: If you think you have taken too much of this medicine contact a poison control center or emergency room at once. NOTE: This medicine is only for you. Do not share this medicine with others. What if I miss a dose? It is important not to miss your dose. Call your doctor or health care professional if you are unable to keep an appointment. What may interact with this medicine? Interactions are not expected. Give your health care provider a list of all the medicines, herbs, non-prescription drugs, or dietary supplements you use. Also tell them if you smoke, drink alcohol, or use illegal drugs. Some items may interact with your medicine. This list may not describe all possible interactions. Give your health care provider a list of all the medicines, herbs, non-prescription drugs, or dietary supplements you use. Also tell them if you smoke, drink alcohol, or use illegal drugs. Some items may interact with your medicine. What should I watch for while using this medicine? Your condition will be monitored carefully while you are receiving this medicine. Report any side effects. Continue your course of treatment even though you feel ill unless your doctor tells you to stop. You may need blood work done while you are taking this medicine. Do not become pregnant while taking this medicine or for at least 30 days after stopping it. Women should inform their doctor if they wish to become pregnant or think they might be pregnant. There is a potential for serious side effects to an unborn child. Men should not father a child while taking this medicine and for 90 days after stopping it. Talk to your health care professional or pharmacist for more information. Do not breast-feed an infant while taking this medicine. Check with your doctor or health care professional if you get an attack of severe diarrhea, nausea and vomiting, or if you sweat a lot. The loss of too much body fluid can make it  dangerous for you to take this medicine. You may get dizzy. Do not drive, use machinery, or do anything that needs mental alertness until you know how this medicine affects you. Do not stand or sit up quickly, especially if you are an older patient. This reduces the risk of dizzy or fainting spells. What side effects may I notice from receiving this medicine? Side effects that you should report to your doctor or health care professional as soon as possible: -allergic reactions like skin rash, itching or hives, swelling of the face, lips, or tongue -confusion -dizziness -feeling faint or lightheaded -fever or chills -palpitations -seizures -signs and symptoms of bleeding such as bloody or black, tarry stools; red or dark-brown urine; spitting up blood or brown material that looks like coffee grounds; red spots on the skin; unusual bruising or bleeding including from the eye, gums, or nose -signs and symptoms of a blood clot such as breathing problems; changes in vision; chest pain; severe, sudden headache; pain, swelling, warmth in the leg; trouble speaking; sudden numbness or weakness of the face, arm or leg -signs and symptoms of kidney injury like trouble passing urine or change in the amount of urine -signs and symptoms of liver injury like dark yellow or brown urine; general ill feeling or flu-like symptoms; light-colored stools; loss of appetite; nausea; right upper belly pain; unusually weak or tired; yellowing of the   eyes or skin Side effects that usually do not require medical attention (report to your doctor or health care professional if they continue or are bothersome): -back pain -cough -diarrhea -headache -muscle cramps -vomiting This list may not describe all possible side effects. Call your doctor for medical advice about side effects. You may report side effects to FDA at 1-800-FDA-1088. Where should I keep my medicine? This drug is given in a hospital or clinic and will not  be stored at home. NOTE: This sheet is a summary. It may not cover all possible information. If you have questions about this medicine, talk to your doctor, pharmacist, or health care provider.  2018 Elsevier/Gold Standard (2015-08-31 13:39:23)  

## 2018-01-23 LAB — KAPPA/LAMBDA LIGHT CHAINS
Kappa free light chain: 19.7 mg/L — ABNORMAL HIGH (ref 3.3–19.4)
Kappa, lambda light chain ratio: 0.42 (ref 0.26–1.65)
Lambda free light chains: 47 mg/L — ABNORMAL HIGH (ref 5.7–26.3)

## 2018-01-23 LAB — IGG, IGA, IGM
IGG (IMMUNOGLOBIN G), SERUM: 777 mg/dL (ref 700–1600)
IGM (IMMUNOGLOBULIN M), SRM: 19 mg/dL (ref 15–143)
IgA: 65 mg/dL (ref 61–437)

## 2018-01-23 LAB — IRON AND TIBC
Iron: 80 ug/dL (ref 42–163)
Saturation Ratios: 32 % — ABNORMAL LOW (ref 42–163)
TIBC: 248 ug/dL (ref 202–409)
UIBC: 168 ug/dL

## 2018-01-23 LAB — FERRITIN: FERRITIN: 150 ng/mL (ref 22–316)

## 2018-01-25 LAB — PROTEIN ELECTROPHORESIS, SERUM
A/G Ratio: 1.4 (ref 0.7–1.7)
Albumin ELP: 3.3 g/dL (ref 2.9–4.4)
Alpha-1-Globulin: 0.2 g/dL (ref 0.0–0.4)
Alpha-2-Globulin: 0.7 g/dL (ref 0.4–1.0)
Beta Globulin: 0.7 g/dL (ref 0.7–1.3)
Gamma Globulin: 0.6 g/dL (ref 0.4–1.8)
Globulin, Total: 2.3 g/dL (ref 2.2–3.9)
M-Spike, %: 0.4 g/dL — ABNORMAL HIGH
Total Protein ELP: 5.6 g/dL — ABNORMAL LOW (ref 6.0–8.5)

## 2018-02-04 ENCOUNTER — Other Ambulatory Visit: Payer: Self-pay | Admitting: Hematology & Oncology

## 2018-02-04 DIAGNOSIS — I10 Essential (primary) hypertension: Secondary | ICD-10-CM

## 2018-02-05 ENCOUNTER — Inpatient Hospital Stay: Payer: Medicare HMO

## 2018-02-05 ENCOUNTER — Inpatient Hospital Stay (HOSPITAL_BASED_OUTPATIENT_CLINIC_OR_DEPARTMENT_OTHER): Payer: Medicare HMO | Admitting: Hematology & Oncology

## 2018-02-05 ENCOUNTER — Other Ambulatory Visit: Payer: Self-pay

## 2018-02-05 ENCOUNTER — Encounter: Payer: Self-pay | Admitting: Hematology & Oncology

## 2018-02-05 VITALS — BP 138/73 | HR 65 | Temp 98.1°F | Resp 19 | Wt 148.0 lb

## 2018-02-05 DIAGNOSIS — C9002 Multiple myeloma in relapse: Secondary | ICD-10-CM

## 2018-02-05 DIAGNOSIS — N289 Disorder of kidney and ureter, unspecified: Secondary | ICD-10-CM

## 2018-02-05 DIAGNOSIS — Z79899 Other long term (current) drug therapy: Secondary | ICD-10-CM | POA: Diagnosis not present

## 2018-02-05 DIAGNOSIS — D508 Other iron deficiency anemias: Secondary | ICD-10-CM

## 2018-02-05 DIAGNOSIS — C9001 Multiple myeloma in remission: Secondary | ICD-10-CM

## 2018-02-05 DIAGNOSIS — Z5112 Encounter for antineoplastic immunotherapy: Secondary | ICD-10-CM | POA: Diagnosis not present

## 2018-02-05 DIAGNOSIS — D631 Anemia in chronic kidney disease: Secondary | ICD-10-CM

## 2018-02-05 DIAGNOSIS — C9000 Multiple myeloma not having achieved remission: Secondary | ICD-10-CM

## 2018-02-05 LAB — CMP (CANCER CENTER ONLY)
ALBUMIN: 3.2 g/dL — AB (ref 3.5–5.0)
ALK PHOS: 77 U/L (ref 26–84)
ALT: 19 U/L (ref 10–47)
AST: 24 U/L (ref 11–38)
Anion gap: 9 (ref 5–15)
BILIRUBIN TOTAL: 1.3 mg/dL (ref 0.2–1.6)
BUN: 28 mg/dL — AB (ref 7–22)
CHLORIDE: 108 mmol/L (ref 98–108)
CO2: 24 mmol/L (ref 18–33)
Calcium: 8.8 mg/dL (ref 8.0–10.3)
Creatinine: 1.5 mg/dL — ABNORMAL HIGH (ref 0.60–1.20)
Glucose, Bld: 119 mg/dL — ABNORMAL HIGH (ref 73–118)
Potassium: 4.8 mmol/L — ABNORMAL HIGH (ref 3.3–4.7)
Sodium: 141 mmol/L (ref 128–145)
TOTAL PROTEIN: 6.3 g/dL — AB (ref 6.4–8.1)

## 2018-02-05 LAB — CBC WITH DIFFERENTIAL (CANCER CENTER ONLY)
BASOS PCT: 1 %
Basophils Absolute: 0.1 10*3/uL (ref 0.0–0.1)
Eosinophils Absolute: 0.2 10*3/uL (ref 0.0–0.5)
Eosinophils Relative: 5 %
HEMATOCRIT: 32.3 % — AB (ref 38.7–49.9)
HEMOGLOBIN: 10.9 g/dL — AB (ref 13.0–17.1)
Lymphocytes Relative: 6 %
Lymphs Abs: 0.3 10*3/uL — ABNORMAL LOW (ref 0.9–3.3)
MCH: 31.7 pg (ref 28.0–33.4)
MCHC: 33.7 g/dL (ref 32.0–35.9)
MCV: 93.9 fL (ref 82.0–98.0)
MONOS PCT: 13 %
Monocytes Absolute: 0.6 10*3/uL (ref 0.1–0.9)
NEUTROS ABS: 3.3 10*3/uL (ref 1.5–6.5)
NEUTROS PCT: 75 %
Platelet Count: 184 10*3/uL (ref 145–400)
RBC: 3.44 MIL/uL — AB (ref 4.20–5.70)
RDW: 16.3 % — ABNORMAL HIGH (ref 11.1–15.7)
WBC Count: 4.5 10*3/uL (ref 4.0–10.0)

## 2018-02-05 LAB — LACTATE DEHYDROGENASE: LDH: 227 U/L — ABNORMAL HIGH (ref 98–192)

## 2018-02-05 MED ORDER — PALONOSETRON HCL INJECTION 0.25 MG/5ML
INTRAVENOUS | Status: AC
  Filled 2018-02-05: qty 5

## 2018-02-05 MED ORDER — DEXAMETHASONE SODIUM PHOSPHATE 10 MG/ML IJ SOLN
10.0000 mg | Freq: Once | INTRAMUSCULAR | Status: AC
  Administered 2018-02-05: 10 mg via INTRAVENOUS

## 2018-02-05 MED ORDER — SODIUM CHLORIDE 0.9 % IV SOLN
Freq: Once | INTRAVENOUS | Status: AC
  Administered 2018-02-05: 15:00:00 via INTRAVENOUS

## 2018-02-05 MED ORDER — DEXAMETHASONE SODIUM PHOSPHATE 10 MG/ML IJ SOLN
INTRAMUSCULAR | Status: AC
  Filled 2018-02-05: qty 1

## 2018-02-05 MED ORDER — DEXTROSE 5 % IV SOLN
60.0000 mg | Freq: Once | INTRAVENOUS | Status: AC
  Administered 2018-02-05: 60 mg via INTRAVENOUS
  Filled 2018-02-05: qty 30

## 2018-02-05 MED ORDER — SODIUM CHLORIDE 0.9 % IV SOLN
300.0000 mg/m2 | Freq: Once | INTRAVENOUS | Status: AC
  Administered 2018-02-05: 540 mg via INTRAVENOUS
  Filled 2018-02-05: qty 27

## 2018-02-05 MED ORDER — PALONOSETRON HCL INJECTION 0.25 MG/5ML
0.2500 mg | Freq: Once | INTRAVENOUS | Status: AC
  Administered 2018-02-05: 0.25 mg via INTRAVENOUS

## 2018-02-05 NOTE — Patient Instructions (Signed)
Carfilzomib injection What is this medicine? CARFILZOMIB (kar FILZ oh mib) targets a specific protein within cancer cells and stops the cancer cells from growing. It is used to treat multiple myeloma. This medicine may be used for other purposes; ask your health care provider or pharmacist if you have questions. COMMON BRAND NAME(S): KYPROLIS What should I tell my health care provider before I take this medicine? They need to know if you have any of these conditions: -heart disease -history of blood clots -irregular heartbeat -kidney disease -liver disease -lung or breathing disease -an unusual or allergic reaction to carfilzomib, or other medicines, foods, dyes, or preservatives -pregnant or trying to get pregnant -breast-feeding How should I use this medicine? This medicine is for injection or infusion into a vein. It is given by a health care professional in a hospital or clinic setting. Talk to your pediatrician regarding the use of this medicine in children. Special care may be needed. Overdosage: If you think you have taken too much of this medicine contact a poison control center or emergency room at once. NOTE: This medicine is only for you. Do not share this medicine with others. What if I miss a dose? It is important not to miss your dose. Call your doctor or health care professional if you are unable to keep an appointment. What may interact with this medicine? Interactions are not expected. Give your health care provider a list of all the medicines, herbs, non-prescription drugs, or dietary supplements you use. Also tell them if you smoke, drink alcohol, or use illegal drugs. Some items may interact with your medicine. This list may not describe all possible interactions. Give your health care provider a list of all the medicines, herbs, non-prescription drugs, or dietary supplements you use. Also tell them if you smoke, drink alcohol, or use illegal drugs. Some items may  interact with your medicine. What should I watch for while using this medicine? Your condition will be monitored carefully while you are receiving this medicine. Report any side effects. Continue your course of treatment even though you feel ill unless your doctor tells you to stop. You may need blood work done while you are taking this medicine. Do not become pregnant while taking this medicine or for at least 30 days after stopping it. Women should inform their doctor if they wish to become pregnant or think they might be pregnant. There is a potential for serious side effects to an unborn child. Men should not father a child while taking this medicine and for 90 days after stopping it. Talk to your health care professional or pharmacist for more information. Do not breast-feed an infant while taking this medicine. Check with your doctor or health care professional if you get an attack of severe diarrhea, nausea and vomiting, or if you sweat a lot. The loss of too much body fluid can make it dangerous for you to take this medicine. You may get dizzy. Do not drive, use machinery, or do anything that needs mental alertness until you know how this medicine affects you. Do not stand or sit up quickly, especially if you are an older patient. This reduces the risk of dizzy or fainting spells. What side effects may I notice from receiving this medicine? Side effects that you should report to your doctor or health care professional as soon as possible: -allergic reactions like skin rash, itching or hives, swelling of the face, lips, or tongue -confusion -dizziness -feeling faint or lightheaded -fever or chills -  palpitations -seizures -signs and symptoms of bleeding such as bloody or black, tarry stools; red or dark-brown urine; spitting up blood or brown material that looks like coffee grounds; red spots on the skin; unusual bruising or bleeding including from the eye, gums, or nose -signs and symptoms of  a blood clot such as breathing problems; changes in vision; chest pain; severe, sudden headache; pain, swelling, warmth in the leg; trouble speaking; sudden numbness or weakness of the face, arm or leg -signs and symptoms of kidney injury like trouble passing urine or change in the amount of urine -signs and symptoms of liver injury like dark yellow or brown urine; general ill feeling or flu-like symptoms; light-colored stools; loss of appetite; nausea; right upper belly pain; unusually weak or tired; yellowing of the eyes or skin Side effects that usually do not require medical attention (report to your doctor or health care professional if they continue or are bothersome): -back pain -cough -diarrhea -headache -muscle cramps -vomiting This list may not describe all possible side effects. Call your doctor for medical advice about side effects. You may report side effects to FDA at 1-800-FDA-1088. Where should I keep my medicine? This drug is given in a hospital or clinic and will not be stored at home. NOTE: This sheet is a summary. It may not cover all possible information. If you have questions about this medicine, talk to your doctor, pharmacist, or health care provider.  2018 Elsevier/Gold Standard (2015-08-31 13:39:23)  Cyclophosphamide injection What is this medicine? CYCLOPHOSPHAMIDE (sye kloe FOSS fa mide) is a chemotherapy drug. It slows the growth of cancer cells. This medicine is used to treat many types of cancer like lymphoma, myeloma, leukemia, breast cancer, and ovarian cancer, to name a few. This medicine may be used for other purposes; ask your health care provider or pharmacist if you have questions. COMMON BRAND NAME(S): Cytoxan, Neosar What should I tell my health care provider before I take this medicine? They need to know if you have any of these conditions: -blood disorders -history of other chemotherapy -infection -kidney disease -liver disease -recent or ongoing  radiation therapy -tumors in the bone marrow -an unusual or allergic reaction to cyclophosphamide, other chemotherapy, other medicines, foods, dyes, or preservatives -pregnant or trying to get pregnant -breast-feeding How should I use this medicine? This drug is usually given as an injection into a vein or muscle or by infusion into a vein. It is administered in a hospital or clinic by a specially trained health care professional. Talk to your pediatrician regarding the use of this medicine in children. Special care may be needed. Overdosage: If you think you have taken too much of this medicine contact a poison control center or emergency room at once. NOTE: This medicine is only for you. Do not share this medicine with others. What if I miss a dose? It is important not to miss your dose. Call your doctor or health care professional if you are unable to keep an appointment. What may interact with this medicine? This medicine may interact with the following medications: -amiodarone -amphotericin B -azathioprine -certain antiviral medicines for HIV or AIDS such as protease inhibitors (e.g., indinavir, ritonavir) and zidovudine -certain blood pressure medications such as benazepril, captopril, enalapril, fosinopril, lisinopril, moexipril, monopril, perindopril, quinapril, ramipril, trandolapril -certain cancer medications such as anthracyclines (e.g., daunorubicin, doxorubicin), busulfan, cytarabine, paclitaxel, pentostatin, tamoxifen, trastuzumab -certain diuretics such as chlorothiazide, chlorthalidone, hydrochlorothiazide, indapamide, metolazone -certain medicines that treat or prevent blood clots like warfarin -certain   muscle relaxants such as succinylcholine -cyclosporine -etanercept -indomethacin -medicines to increase blood counts like filgrastim, pegfilgrastim, sargramostim -medicines used as general anesthesia -metronidazole -natalizumab This list may not describe all possible  interactions. Give your health care provider a list of all the medicines, herbs, non-prescription drugs, or dietary supplements you use. Also tell them if you smoke, drink alcohol, or use illegal drugs. Some items may interact with your medicine. What should I watch for while using this medicine? Visit your doctor for checks on your progress. This drug may make you feel generally unwell. This is not uncommon, as chemotherapy can affect healthy cells as well as cancer cells. Report any side effects. Continue your course of treatment even though you feel ill unless your doctor tells you to stop. Drink water or other fluids as directed. Urinate often, even at night. In some cases, you may be given additional medicines to help with side effects. Follow all directions for their use. Call your doctor or health care professional for advice if you get a fever, chills or sore throat, or other symptoms of a cold or flu. Do not treat yourself. This drug decreases your body's ability to fight infections. Try to avoid being around people who are sick. This medicine may increase your risk to bruise or bleed. Call your doctor or health care professional if you notice any unusual bleeding. Be careful brushing and flossing your teeth or using a toothpick because you may get an infection or bleed more easily. If you have any dental work done, tell your dentist you are receiving this medicine. You may get drowsy or dizzy. Do not drive, use machinery, or do anything that needs mental alertness until you know how this medicine affects you. Do not become pregnant while taking this medicine or for 1 year after stopping it. Women should inform their doctor if they wish to become pregnant or think they might be pregnant. Men should not father a child while taking this medicine and for 4 months after stopping it. There is a potential for serious side effects to an unborn child. Talk to your health care professional or pharmacist for  more information. Do not breast-feed an infant while taking this medicine. This medicine may interfere with the ability to have a child. This medicine has caused ovarian failure in some women. This medicine has caused reduced sperm counts in some men. You should talk with your doctor or health care professional if you are concerned about your fertility. If you are going to have surgery, tell your doctor or health care professional that you have taken this medicine. What side effects may I notice from receiving this medicine? Side effects that you should report to your doctor or health care professional as soon as possible: -allergic reactions like skin rash, itching or hives, swelling of the face, lips, or tongue -low blood counts - this medicine may decrease the number of white blood cells, red blood cells and platelets. You may be at increased risk for infections and bleeding. -signs of infection - fever or chills, cough, sore throat, pain or difficulty passing urine -signs of decreased platelets or bleeding - bruising, pinpoint red spots on the skin, black, tarry stools, blood in the urine -signs of decreased red blood cells - unusually weak or tired, fainting spells, lightheadedness -breathing problems -dark urine -dizziness -palpitations -swelling of the ankles, feet, hands -trouble passing urine or change in the amount of urine -weight gain -yellowing of the eyes or skin Side effects that   usually do not require medical attention (report to your doctor or health care professional if they continue or are bothersome): -changes in nail or skin color -hair loss -missed menstrual periods -mouth sores -nausea, vomiting This list may not describe all possible side effects. Call your doctor for medical advice about side effects. You may report side effects to FDA at 1-800-FDA-1088. Where should I keep my medicine? This drug is given in a hospital or clinic and will not be stored at  home. NOTE: This sheet is a summary. It may not cover all possible information. If you have questions about this medicine, talk to your doctor, pharmacist, or health care provider.  2018 Elsevier/Gold Standard (2012-06-12 16:22:58)  

## 2018-02-05 NOTE — Progress Notes (Signed)
Hematology and Oncology Follow Up Visit  Christian Burns [id number] 04-19-1930 84 y.o. 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 He continues to do well and has no compliants at this time.  His M-spike earlier this month was 0.4,  IgG 777 mg/dL and lambda light chain 1.9 mg/dL.   He has had no fever, chills, n/v, cough, rash, dizziness, SOB, chest pain, palpitations, abdominal pain or changes in bowel or bladder habits.   The numbness and tingling in the hands and feet is unchanged. No swelling or tenderness in his extremities. No c/o pain.   He has maintained a good appetite and is does his best to stay well hydrated. His weight is stable.   ECOG Performance Status: 1 - Symptomatic but completely ambulatory  Medications:  Allergies as of 02/05/2018      Reactions   Sulfa Antibiotics Itching   Sulfasalazine Itching      Medication List        Accurate as of 02/05/18  2:27 PM. Always use your most recent med list.          amLODipine-benazepril 5-10 MG capsule Commonly known as:  LOTREL TAKE 1 CAPSULE BY MOUTH DAILY   MULTIVITAL PO Take 1 tablet by mouth every morning.   pyridOXINE 100 MG tablet Commonly known as:  VITAMIN B-6 Take 100 mg by mouth daily.   VITAMIN B 12 PO Take 1,000 mcg by mouth every morning.   Vitamin D (Ergocalciferol) 50000 units Caps capsule Commonly  known as:  DRISDOL Take 50,000 Units by mouth every 7 (seven) days. Sundays       Allergies:  Allergies  Allergen Reactions  . Sulfa Antibiotics Itching  . Sulfasalazine Itching    Past Medical History, Surgical history, Social history, and Family History were reviewed and updated.  Review of Systems: Review of Systems  Constitutional: Negative.   HENT: Negative.   Eyes: Negative.   Respiratory: Negative.   Cardiovascular: Negative.   Gastrointestinal: Negative.   Genitourinary: Negative.   Musculoskeletal: Negative.   Skin: Negative.   Neurological: Negative.   Endo/Heme/Allergies: Negative.   Psychiatric/Behavioral: Negative.      Physical Exam:  weight is 148 lb (67.1 kg). His oral temperature is 98.1 F (36.7 C). His blood pressure is 138/73 and his pulse is 65. His respiration is 19 and oxygen saturation is 99%.   Wt Readings from Last 3 Encounters:  02/05/18 148 lb (67.1 kg)  01/22/18 149 lb (67.6 kg)  12/25/17 148 lb (67.1 kg)    Physical Exam  Constitutional: He is oriented to person, place, and time.  HENT:  Head: Normocephalic and atraumatic.  Mouth/Throat: Oropharynx is clear and moist.  Eyes: Pupils are equal, round, and reactive to light. EOM are normal.  Neck: Normal range of motion.  Cardiovascular: Normal rate, regular rhythm and normal heart sounds.  Pulmonary/Chest: Effort normal and breath sounds normal.  Abdominal: Soft.  Bowel sounds are normal.  Musculoskeletal: Normal range of motion. He exhibits no edema, tenderness or deformity.  Lymphadenopathy:    He has no cervical adenopathy.  Neurological: He is alert and oriented to person, place, and time.  Skin: Skin is warm and dry. No rash noted. No erythema.  Psychiatric: He has a normal mood and affect. His behavior is normal. Judgment and thought content normal.  Vitals reviewed.    Lab Results  Component Value Date   WBC 4.5 02/05/2018   HGB 10.9 (L) 02/05/2018   HCT 32.3 (L)  02/05/2018   MCV 93.9 02/05/2018   PLT 184 02/05/2018   Lab Results  Component Value Date   FERRITIN 150 01/22/2018   IRON 80 01/22/2018   TIBC 248 01/22/2018   UIBC 168 01/22/2018   IRONPCTSAT 32 (L) 01/22/2018   Lab Results  Component Value Date   RBC 3.44 (L) 02/05/2018   Lab Results  Component Value Date   KPAFRELGTCHN 19.7 (H) 01/22/2018   LAMBDASER 47.0 (H) 01/22/2018   KAPLAMBRATIO 0.42 01/22/2018   Lab Results  Component Value Date   IGGSERUM 777 01/22/2018   IGA 65 01/22/2018   IGMSERUM 19 01/22/2018   Lab Results  Component Value Date   TOTALPROTELP 5.6 (L) 01/22/2018   ALBUMINELP 3.3 01/22/2018   A1GS 0.2 01/22/2018   A2GS 0.7 01/22/2018   BETS 0.7 01/22/2018   BETA2SER 0.3 07/28/2015   GAMS 0.6 01/22/2018   MSPIKE 0.4 (H) 01/22/2018   SPEI Comment 01/22/2018     Chemistry      Component Value Date/Time   NA 141 02/05/2018 1309   NA 146 (H) 08/07/2017 1153   NA 141 01/09/2017 1004   K 4.8 (H) 02/05/2018 1309   K 4.6 08/07/2017 1153   K 4.4 01/09/2017 1004   CL 108 02/05/2018 1309   CL 108 08/07/2017 1153   CO2 24 02/05/2018 1309   CO2 26 08/07/2017 1153   CO2 22 01/09/2017 1004   BUN 28 (H) 02/05/2018 1309   BUN 24 (H) 08/07/2017 1153   BUN 23.9 01/09/2017 1004   CREATININE 1.50 (H) 02/05/2018 1309   CREATININE 1.5 (H) 08/07/2017 1153   CREATININE 1.5 (H) 01/09/2017 1004      Component Value Date/Time   CALCIUM 8.8 02/05/2018 1309   CALCIUM 8.8 08/07/2017 1153   CALCIUM 8.8 01/09/2017 1004   ALKPHOS 77 02/05/2018 1309   ALKPHOS 97 (H) 08/07/2017 1153   ALKPHOS 80 01/09/2017 1004   AST 24 02/05/2018 1309   AST 18 01/09/2017 1004   ALT 19 02/05/2018 1309   ALT 20 08/07/2017 1153   ALT 12 01/09/2017 1004   BILITOT 1.3 02/05/2018 1309   BILITOT 0.92 01/09/2017 1004      Impression and Plan: Christian Burns is a very pleasant 84 yo caucasian gentleman with relapsed IgG lambda myeloma. He continues to tolerate treatment nicely and has no  complaints at this time.   We will proceed with treatment today as planned.   His hemoglobin is up which is nice to see.  We will see him in another 2 weeks for follow-up.     [name], MD 6/27/20192:27 PM

## 2018-02-06 LAB — IGG, IGA, IGM
IgA: 62 mg/dL (ref 61–437)
IgG (Immunoglobin G), Serum: 788 mg/dL (ref 700–1600)
IgM (Immunoglobulin M), Srm: 23 mg/dL (ref 15–143)

## 2018-02-06 LAB — KAPPA/LAMBDA LIGHT CHAINS
KAPPA FREE LGHT CHN: 18.9 mg/L (ref 3.3–19.4)
KAPPA, LAMDA LIGHT CHAIN RATIO: 0.41 (ref 0.26–1.65)
LAMDA FREE LIGHT CHAINS: 46.5 mg/L — AB (ref 5.7–26.3)

## 2018-02-06 LAB — IRON AND TIBC
Iron: 90 ug/dL (ref 45–182)
Saturation Ratios: 31 % (ref 17.9–39.5)
TIBC: 291 ug/dL (ref 250–450)
UIBC: 201 ug/dL

## 2018-02-06 LAB — FERRITIN: FERRITIN: 48 ng/mL (ref 24–336)

## 2018-02-09 LAB — PROTEIN ELECTROPHORESIS, SERUM, WITH REFLEX
A/G Ratio: 1.4 (ref 0.7–1.7)
ALBUMIN ELP: 3.6 g/dL (ref 2.9–4.4)
ALPHA-1-GLOBULIN: 0.3 g/dL (ref 0.0–0.4)
Alpha-2-Globulin: 0.7 g/dL (ref 0.4–1.0)
Beta Globulin: 0.8 g/dL (ref 0.7–1.3)
Gamma Globulin: 0.7 g/dL (ref 0.4–1.8)
Globulin, Total: 2.5 g/dL (ref 2.2–3.9)
M-Spike, %: 0.5 g/dL — ABNORMAL HIGH
SPEP INTERP: 0
Total Protein ELP: 6.1 g/dL (ref 6.0–8.5)

## 2018-02-09 LAB — IMMUNOFIXATION REFLEX, SERUM
IGG (IMMUNOGLOBIN G), SERUM: 827 mg/dL (ref 700–1600)
IGM (IMMUNOGLOBULIN M), SRM: 20 mg/dL (ref 15–143)
IgA: 70 mg/dL (ref 61–437)

## 2018-02-19 ENCOUNTER — Inpatient Hospital Stay: Payer: Medicare HMO

## 2018-02-19 ENCOUNTER — Other Ambulatory Visit: Payer: Self-pay

## 2018-02-19 ENCOUNTER — Inpatient Hospital Stay: Payer: Medicare HMO | Attending: Hematology & Oncology | Admitting: Family

## 2018-02-19 ENCOUNTER — Encounter: Payer: Self-pay | Admitting: Hematology & Oncology

## 2018-02-19 VITALS — BP 132/63 | HR 80 | Temp 99.2°F | Resp 18 | Wt 147.0 lb

## 2018-02-19 DIAGNOSIS — L0201 Cutaneous abscess of face: Secondary | ICD-10-CM | POA: Diagnosis not present

## 2018-02-19 DIAGNOSIS — D508 Other iron deficiency anemias: Secondary | ICD-10-CM

## 2018-02-19 DIAGNOSIS — Z5112 Encounter for antineoplastic immunotherapy: Secondary | ICD-10-CM | POA: Diagnosis present

## 2018-02-19 DIAGNOSIS — Z5111 Encounter for antineoplastic chemotherapy: Secondary | ICD-10-CM | POA: Insufficient documentation

## 2018-02-19 DIAGNOSIS — C9002 Multiple myeloma in relapse: Secondary | ICD-10-CM | POA: Diagnosis not present

## 2018-02-19 DIAGNOSIS — C9001 Multiple myeloma in remission: Secondary | ICD-10-CM

## 2018-02-19 DIAGNOSIS — Z79899 Other long term (current) drug therapy: Secondary | ICD-10-CM | POA: Diagnosis not present

## 2018-02-19 DIAGNOSIS — C9000 Multiple myeloma not having achieved remission: Secondary | ICD-10-CM

## 2018-02-19 DIAGNOSIS — Z923 Personal history of irradiation: Secondary | ICD-10-CM | POA: Insufficient documentation

## 2018-02-19 DIAGNOSIS — G629 Polyneuropathy, unspecified: Secondary | ICD-10-CM | POA: Insufficient documentation

## 2018-02-19 DIAGNOSIS — N289 Disorder of kidney and ureter, unspecified: Secondary | ICD-10-CM | POA: Diagnosis not present

## 2018-02-19 DIAGNOSIS — L089 Local infection of the skin and subcutaneous tissue, unspecified: Secondary | ICD-10-CM

## 2018-02-19 DIAGNOSIS — D649 Anemia, unspecified: Secondary | ICD-10-CM | POA: Insufficient documentation

## 2018-02-19 LAB — CBC WITH DIFFERENTIAL (CANCER CENTER ONLY)
BASOS ABS: 0.1 10*3/uL (ref 0.0–0.1)
Basophils Relative: 1 %
Eosinophils Absolute: 0.2 10*3/uL (ref 0.0–0.5)
Eosinophils Relative: 2 %
HEMATOCRIT: 30.1 % — AB (ref 38.7–49.9)
Hemoglobin: 10.1 g/dL — ABNORMAL LOW (ref 13.0–17.1)
LYMPHS PCT: 4 %
Lymphs Abs: 0.4 10*3/uL — ABNORMAL LOW (ref 0.9–3.3)
MCH: 31.9 pg (ref 28.0–33.4)
MCHC: 33.6 g/dL (ref 32.0–35.9)
MCV: 95 fL (ref 82.0–98.0)
MONO ABS: 1 10*3/uL — AB (ref 0.1–0.9)
Monocytes Relative: 11 %
NEUTROS ABS: 7.5 10*3/uL — AB (ref 1.5–6.5)
Neutrophils Relative %: 82 %
Platelet Count: 165 10*3/uL (ref 145–400)
RBC: 3.17 MIL/uL — AB (ref 4.20–5.70)
RDW: 14.7 % (ref 11.1–15.7)
WBC: 9.1 10*3/uL (ref 4.0–10.0)

## 2018-02-19 LAB — CMP (CANCER CENTER ONLY)
ALT: 23 U/L (ref 10–47)
ANION GAP: 7 (ref 5–15)
AST: 22 U/L (ref 11–38)
Albumin: 3.2 g/dL — ABNORMAL LOW (ref 3.5–5.0)
Alkaline Phosphatase: 86 U/L — ABNORMAL HIGH (ref 26–84)
BILIRUBIN TOTAL: 1.5 mg/dL (ref 0.2–1.6)
BUN: 33 mg/dL — ABNORMAL HIGH (ref 7–22)
CALCIUM: 8.6 mg/dL (ref 8.0–10.3)
CO2: 22 mmol/L (ref 18–33)
Chloride: 109 mmol/L — ABNORMAL HIGH (ref 98–108)
Creatinine: 1.5 mg/dL — ABNORMAL HIGH (ref 0.60–1.20)
Glucose, Bld: 123 mg/dL — ABNORMAL HIGH (ref 73–118)
POTASSIUM: 4.6 mmol/L (ref 3.3–4.7)
Sodium: 138 mmol/L (ref 128–145)
Total Protein: 6.4 g/dL (ref 6.4–8.1)

## 2018-02-19 MED ORDER — DOXYCYCLINE HYCLATE 100 MG PO TABS: 100.0000 mg | ORAL_TABLET | Freq: Two times a day (BID) | ORAL | 0 refills | Status: DC

## 2018-02-19 MED ORDER — PALONOSETRON HCL INJECTION 0.25 MG/5ML
0.2500 mg | Freq: Once | INTRAVENOUS | Status: AC
  Administered 2018-02-19: 0.25 mg via INTRAVENOUS

## 2018-02-19 MED ORDER — DEXTROSE 5 % IV SOLN
60.0000 mg | Freq: Once | INTRAVENOUS | Status: AC
  Administered 2018-02-19: 60 mg via INTRAVENOUS
  Filled 2018-02-19: qty 30

## 2018-02-19 MED ORDER — SODIUM CHLORIDE 0.9 % IV SOLN
Freq: Once | INTRAVENOUS | Status: AC
  Administered 2018-02-19: 14:00:00 via INTRAVENOUS

## 2018-02-19 MED ORDER — SODIUM CHLORIDE 0.9 % IV SOLN
300.0000 mg/m2 | Freq: Once | INTRAVENOUS | Status: AC
  Administered 2018-02-19: 540 mg via INTRAVENOUS
  Filled 2018-02-19: qty 27

## 2018-02-19 MED ORDER — PALONOSETRON HCL INJECTION 0.25 MG/5ML
INTRAVENOUS | Status: AC
  Filled 2018-02-19: qty 5

## 2018-02-19 MED ORDER — DEXAMETHASONE SODIUM PHOSPHATE 10 MG/ML IJ SOLN
INTRAMUSCULAR | Status: AC
  Filled 2018-02-19: qty 1

## 2018-02-19 MED ORDER — DEXAMETHASONE SODIUM PHOSPHATE 10 MG/ML IJ SOLN
10.0000 mg | Freq: Once | INTRAMUSCULAR | Status: AC
  Administered 2018-02-19: 10 mg via INTRAVENOUS

## 2018-02-19 NOTE — Progress Notes (Signed)
Hematology and Oncology Follow Up Visit  Christian Burns [id number] [birth year] 84 y.o. 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 Isabell is here today for follow-up and treatment. He appears to have another infection at the base of his chin. The area is red, hot and hard to the touch. There is no drainage or open sore at this time. His temperature is 99.2 at this time, WBC count is now 9.1. I spoke with Dr. Marin Olp and we will have him contact his dermatologist Dr. Renda Rolls.  M-spike last month was 0.5, IgG 788 mg/dL and lambda light chain 4.65 mg/dL.  No chills, n/v, cough, rash, dizziness, SOB, chest pain, palpitations, abdominal pain or changes in bowel or bladder habits.  No swelling or tenderness in his extremities. The numbness or tingling in his hands and feet is unchanged.  No episodes of bleeding, no bruising or petechaie.  No lymphadenopathy noted on exam.    He has maintained a good appetite and is staying well hydrated. His weight is stable.   ECOG Performance Status: 1 - Symptomatic but completely ambulatory  Medications:  Allergies as of 02/19/2018      Reactions   Sulfa Antibiotics Itching   Sulfasalazine Itching      Medication List        Accurate as of 02/19/18  2:13 PM. Always use your most recent med list.          amLODipine-benazepril 5-10 MG capsule Commonly known as:  LOTREL TAKE 1 CAPSULE BY MOUTH DAILY   doxycycline 100 MG tablet Commonly known as:  VIBRA-TABS Take 1 tablet (100 mg total) by mouth 2 (two) times daily.   MULTIVITAL PO Take 1 tablet by mouth every morning.   pyridOXINE 100 MG tablet Commonly known as:  VITAMIN B-6 Take 100 mg by mouth daily.   VITAMIN B 12 PO Take 1,000 mcg by mouth every morning.   Vitamin D (Ergocalciferol) 50000 units Caps capsule Commonly known as:  DRISDOL Take 50,000 Units by mouth every 7 (seven) days. Sundays       Allergies:  Allergies  Allergen Reactions  . Sulfa Antibiotics Itching  . Sulfasalazine Itching    Past Medical History, Surgical history, Social history, and Family History were reviewed and updated.  Review of Systems: All other 10 point review of systems is negative.   Physical Exam:  weight is 147 lb (66.7 kg). His oral temperature is 99.2 F (37.3 C). His blood pressure is 132/63 and his pulse is 80. His respiration is 18 and oxygen saturation is 100%.   Wt Readings from Last 3 Encounters:  02/19/18 147 lb (66.7 kg)  02/05/18 148 lb (67.1 kg)  01/22/18 149 lb (67.6 kg)    Ocular: Sclerae unicteric, pupils equal, round and reactive to light Ear-nose-throat: Oropharynx clear, dentition fair Lymphatic: No cervical, supraclavicular or axillary adenopathy Lungs no rales or rhonchi, good excursion  bilaterally Heart regular rate and rhythm, no murmur appreciated Abd soft, nontender, positive bowel sounds, no liver or spleen tip palpated on exam, no fluid wave  MSK no focal spinal tenderness, no joint edema Neuro: non-focal, well-oriented, appropriate affect Breasts: Deferred   Lab Results  Component Value Date   WBC 9.1 02/19/2018   HGB 10.1 (L) 02/19/2018   HCT 30.1 (L) 02/19/2018   MCV 95.0 02/19/2018   PLT 165 02/19/2018   Lab Results  Component Value Date   FERRITIN 48 02/05/2018   IRON 90 02/05/2018   TIBC 291 02/05/2018   UIBC 201 02/05/2018   IRONPCTSAT 31 02/05/2018   Lab Results    Component Value Date   RBC 3.17 (L) 02/19/2018   Lab Results  Component Value Date   KPAFRELGTCHN 18.9 02/05/2018   LAMBDASER 46.5 (H) 02/05/2018   KAPLAMBRATIO 0.41 02/05/2018   Lab Results  Component Value Date   IGGSERUM 827 02/05/2018   IGA 70 02/05/2018   IGMSERUM 20 02/05/2018   Lab Results  Component Value Date   TOTALPROTELP 6.1 02/05/2018   ALBUMINELP 3.6 02/05/2018   A1GS 0.3 02/05/2018   A2GS 0.7 02/05/2018   BETS 0.8 02/05/2018   BETA2SER 0.3 07/28/2015   GAMS 0.7 02/05/2018   MSPIKE 0.5 (H) 02/05/2018   SPEI Comment 01/22/2018     Chemistry      Component Value Date/Time   NA 138 02/19/2018 1253   NA 146 (H) 08/07/2017 1153   NA 141 01/09/2017 1004   K 4.6 02/19/2018 1253   K 4.6 08/07/2017 1153   K 4.4 01/09/2017 1004   CL 109 (H) 02/19/2018 1253   CL 108 08/07/2017 1153   CO2 22 02/19/2018 1253   CO2 26 08/07/2017 1153   CO2 22 01/09/2017 1004   BUN 33 (H) 02/19/2018 1253   BUN 24 (H) 08/07/2017 1153   BUN 23.9 01/09/2017 1004   CREATININE 1.50 (H) 02/19/2018 1253   CREATININE 1.5 (H) 08/07/2017 1153   CREATININE 1.5 (H) 01/09/2017 1004      Component Value Date/Time   CALCIUM 8.6 02/19/2018 1253   CALCIUM 8.8 08/07/2017 1153   CALCIUM 8.8 01/09/2017 1004   ALKPHOS 86 (H) 02/19/2018 1253   ALKPHOS 97 (H) 08/07/2017 1153   ALKPHOS 80 01/09/2017 1004   AST 22 02/19/2018 1253   AST 18 01/09/2017 1004   ALT 23 02/19/2018 1253   ALT 20 08/07/2017 1153   ALT 12 01/09/2017 1004   BILITOT 1.5 02/19/2018 1253   BILITOT 0.92 01/09/2017 1004       Impression and Plan: Mr. Vickerman is a very pleasant 84 yo caucasian gentleman with relapsed IgG lambda myeloma. He is tolerating treatment well.  I spoke with Dr. Marin Olp and we will get him on to Doxycycline for his chin infection. We also advised him to contact his dermatologist for further work up.  We will proceed with treatment today as planned.  We will see him back in another 2 weeks for  follow-up and treatment.   Laverna Peace, NP 7/11/20192:13 PM

## 2018-02-19 NOTE — Patient Instructions (Signed)
[facility] Discharge Instructions for Patients Receiving Chemotherapy  Today you received the following chemotherapy agents:  Cytoxan and Kyprolis  To help prevent nausea and vomiting after your treatment, we encourage you to take your nausea medication as ordered per MD.    If you develop nausea and vomiting that is not controlled by your nausea medication, call the clinic.   BELOW ARE SYMPTOMS THAT SHOULD BE REPORTED IMMEDIATELY:  *FEVER GREATER THAN 100.5 F  *CHILLS WITH OR WITHOUT FEVER  NAUSEA AND VOMITING THAT IS NOT CONTROLLED WITH YOUR NAUSEA MEDICATION  *UNUSUAL SHORTNESS OF BREATH  *UNUSUAL BRUISING OR BLEEDING  TENDERNESS IN MOUTH AND THROAT WITH OR WITHOUT PRESENCE OF ULCERS  *URINARY PROBLEMS  *BOWEL PROBLEMS  UNUSUAL RASH Items with * indicate a potential emergency and should be followed up as soon as possible.  Feel free to call the clinic should you have any questions or concerns. The clinic phone number is [phone].  Please show the CHEMO ALERT CARD at check-in to the Emergency Department and triage nurse.   

## 2018-02-20 LAB — IGG, IGA, IGM
IGA: 64 mg/dL (ref 61–437)
IGG (IMMUNOGLOBIN G), SERUM: 774 mg/dL (ref 700–1600)
IGM (IMMUNOGLOBULIN M), SRM: 18 mg/dL (ref 15–143)

## 2018-02-20 LAB — KAPPA/LAMBDA LIGHT CHAINS
Kappa free light chain: 18.5 mg/L (ref 3.3–19.4)
Kappa, lambda light chain ratio: 0.39 (ref 0.26–1.65)
Lambda free light chains: 47.2 mg/L — ABNORMAL HIGH (ref 5.7–26.3)

## 2018-02-24 LAB — PROTEIN ELECTROPHORESIS, SERUM, WITH REFLEX
A/G RATIO SPE: 1.3 (ref 0.7–1.7)
ALBUMIN ELP: 3.5 g/dL (ref 2.9–4.4)
ALPHA-2-GLOBULIN: 0.8 g/dL (ref 0.4–1.0)
Alpha-1-Globulin: 0.3 g/dL (ref 0.0–0.4)
BETA GLOBULIN: 0.8 g/dL (ref 0.7–1.3)
Gamma Globulin: 0.7 g/dL (ref 0.4–1.8)
Globulin, Total: 2.6 g/dL (ref 2.2–3.9)
M-Spike, %: 0.4 g/dL — ABNORMAL HIGH
SPEP INTERP: 0
Total Protein ELP: 6.1 g/dL (ref 6.0–8.5)

## 2018-02-24 LAB — IMMUNOFIXATION REFLEX, SERUM
IGG (IMMUNOGLOBIN G), SERUM: 800 mg/dL (ref 700–1600)
IGM (IMMUNOGLOBULIN M), SRM: 20 mg/dL (ref 15–143)
IgA: 62 mg/dL (ref 61–437)

## 2018-03-02 ENCOUNTER — Telehealth: Payer: Self-pay | Admitting: *Deleted

## 2018-03-02 ENCOUNTER — Other Ambulatory Visit: Payer: Self-pay | Admitting: *Deleted

## 2018-03-02 DIAGNOSIS — L089 Local infection of the skin and subcutaneous tissue, unspecified: Secondary | ICD-10-CM

## 2018-03-02 MED ORDER — DOXYCYCLINE HYCLATE 100 MG PO TABS: 100.0000 mg | ORAL_TABLET | Freq: Two times a day (BID) | ORAL | 0 refills | Status: DC

## 2018-03-02 NOTE — Telephone Encounter (Signed)
Received call from patient detailing a cyst or boil that seems to be infected on his chin. He called the office line this weekend and Nurse discussed with Doctor on call.  Patient is now out of Doxycycline but has appt with ENT doctor tomorrow.  Spoke with Laverna Peace NP.  We will sent RX for Doxycycline to pharmacy to continue until he sees doctor tomorrow.  Patient aware that Doctor may want to stop this antibiotic and start him on something else.  Patient aware of this and understanding of information.  Told to call this doctor if a fever develops.

## 2018-03-03 DIAGNOSIS — D485 Neoplasm of uncertain behavior of skin: Secondary | ICD-10-CM | POA: Diagnosis not present

## 2018-03-03 DIAGNOSIS — T8141XA Infection following a procedure, superficial incisional surgical site, initial encounter: Secondary | ICD-10-CM | POA: Diagnosis not present

## 2018-03-03 DIAGNOSIS — L989 Disorder of the skin and subcutaneous tissue, unspecified: Secondary | ICD-10-CM | POA: Diagnosis not present

## 2018-03-05 ENCOUNTER — Inpatient Hospital Stay (HOSPITAL_BASED_OUTPATIENT_CLINIC_OR_DEPARTMENT_OTHER): Payer: Medicare HMO | Admitting: Hematology & Oncology

## 2018-03-05 ENCOUNTER — Encounter: Payer: Self-pay | Admitting: Hematology & Oncology

## 2018-03-05 ENCOUNTER — Other Ambulatory Visit: Payer: Self-pay

## 2018-03-05 ENCOUNTER — Inpatient Hospital Stay: Payer: Medicare HMO

## 2018-03-05 VITALS — BP 140/75 | HR 59 | Temp 98.1°F | Resp 18 | Wt 146.0 lb

## 2018-03-05 DIAGNOSIS — G629 Polyneuropathy, unspecified: Secondary | ICD-10-CM | POA: Diagnosis not present

## 2018-03-05 DIAGNOSIS — D508 Other iron deficiency anemias: Secondary | ICD-10-CM

## 2018-03-05 DIAGNOSIS — C9001 Multiple myeloma in remission: Secondary | ICD-10-CM

## 2018-03-05 DIAGNOSIS — C9002 Multiple myeloma in relapse: Secondary | ICD-10-CM | POA: Diagnosis not present

## 2018-03-05 DIAGNOSIS — Z79899 Other long term (current) drug therapy: Secondary | ICD-10-CM

## 2018-03-05 DIAGNOSIS — N289 Disorder of kidney and ureter, unspecified: Secondary | ICD-10-CM

## 2018-03-05 DIAGNOSIS — L089 Local infection of the skin and subcutaneous tissue, unspecified: Secondary | ICD-10-CM

## 2018-03-05 DIAGNOSIS — L0201 Cutaneous abscess of face: Secondary | ICD-10-CM | POA: Diagnosis not present

## 2018-03-05 DIAGNOSIS — Z5112 Encounter for antineoplastic immunotherapy: Secondary | ICD-10-CM | POA: Diagnosis not present

## 2018-03-05 DIAGNOSIS — D649 Anemia, unspecified: Secondary | ICD-10-CM

## 2018-03-05 DIAGNOSIS — Z5111 Encounter for antineoplastic chemotherapy: Secondary | ICD-10-CM | POA: Diagnosis not present

## 2018-03-05 DIAGNOSIS — Z923 Personal history of irradiation: Secondary | ICD-10-CM | POA: Diagnosis not present

## 2018-03-05 LAB — CBC WITH DIFFERENTIAL (CANCER CENTER ONLY)
Basophils Absolute: 0.1 10*3/uL (ref 0.0–0.1)
Basophils Relative: 2 %
Eosinophils Absolute: 0.2 10*3/uL (ref 0.0–0.5)
Eosinophils Relative: 4 %
HEMATOCRIT: 29.3 % — AB (ref 38.7–49.9)
HEMOGLOBIN: 10 g/dL — AB (ref 13.0–17.1)
LYMPHS ABS: 0.3 10*3/uL — AB (ref 0.9–3.3)
LYMPHS PCT: 6 %
MCH: 31.4 pg (ref 28.0–33.4)
MCHC: 34.1 g/dL (ref 32.0–35.9)
MCV: 92.1 fL (ref 82.0–98.0)
MONOS PCT: 13 %
Monocytes Absolute: 0.7 10*3/uL (ref 0.1–0.9)
NEUTROS ABS: 3.9 10*3/uL (ref 1.5–6.5)
NEUTROS PCT: 75 %
Platelet Count: 197 10*3/uL (ref 145–400)
RBC: 3.18 MIL/uL — AB (ref 4.20–5.70)
RDW: 13.3 % (ref 11.1–15.7)
WBC: 5.3 10*3/uL (ref 4.0–10.0)

## 2018-03-05 LAB — CMP (CANCER CENTER ONLY)
ALK PHOS: 78 U/L (ref 26–84)
ALT: 20 U/L (ref 10–47)
AST: 22 U/L (ref 11–38)
Albumin: 3.1 g/dL — ABNORMAL LOW (ref 3.5–5.0)
Anion gap: 5 (ref 5–15)
BILIRUBIN TOTAL: 1.1 mg/dL (ref 0.2–1.6)
BUN: 22 mg/dL (ref 7–22)
CHLORIDE: 112 mmol/L — AB (ref 98–108)
CO2: 25 mmol/L (ref 18–33)
CREATININE: 1.3 mg/dL — AB (ref 0.60–1.20)
Calcium: 9.1 mg/dL (ref 8.0–10.3)
Glucose, Bld: 111 mg/dL (ref 73–118)
POTASSIUM: 4.4 mmol/L (ref 3.3–4.7)
Sodium: 142 mmol/L (ref 128–145)
TOTAL PROTEIN: 6.2 g/dL — AB (ref 6.4–8.1)

## 2018-03-05 MED ORDER — PALONOSETRON HCL INJECTION 0.25 MG/5ML
INTRAVENOUS | Status: AC
  Filled 2018-03-05: qty 5

## 2018-03-05 MED ORDER — DEXAMETHASONE SODIUM PHOSPHATE 10 MG/ML IJ SOLN
INTRAMUSCULAR | Status: AC
  Filled 2018-03-05: qty 1

## 2018-03-05 NOTE — Progress Notes (Signed)
Hematology and Oncology Follow Up Visit  Christian Burns [id number] Dec 29, 1929 84 y.o. 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  Status is changed a little bit for him.  He developed another submental abscess.  This opened up on its own over the weekend.  He then went to see his dermatologist.  His dermatologist did a fantastic job and was able to get some biopsies.  Cultures were sent.  None of the results are back yet.  He is on doxycycline.  I want to hold on his chemotherapy right now.  He is doing well with the myeloma was and I want to make sure that we do not compromise his immune system such that he will not fully heal up from this abscess drainage.  His last myeloma studies back in July showed a M spike of 0.4 g/dL.  His IgG level was 800 mg/dL.  His thanks lambda light chain was 4.7 mg/dL.  He otherwise is doing okay.  He still has that peripheral neuropathy in his feet.  This is stable.  He still has the inguinal hernia over on the right side.  This is not become a problem for him.  His appetite is doing well.  He still doing some exercising at his assisted living facility.  Overall, his performance status is ECOG 1.   Medications:  Allergies as of 03/05/2018      Reactions   Sulfa Antibiotics Itching   Sulfasalazine Itching      Medication List        Accurate  as of 03/05/18  1:52 PM. Always use your most recent med list.          amLODipine-benazepril 5-10 MG capsule Commonly known as:  LOTREL TAKE 1 CAPSULE BY MOUTH DAILY   doxycycline 100 MG tablet Commonly known as:  VIBRA-TABS Take 1 tablet (100 mg total) by mouth 2 (two) times daily.   MULTIVITAL PO Take 1 tablet by mouth every morning.   pyridOXINE 100 MG tablet Commonly known as:  VITAMIN B-6 Take 100 mg by mouth daily.   VITAMIN B 12 PO Take 1,000 mcg by mouth every morning.   Vitamin D (Ergocalciferol) 50000 units Caps capsule Commonly known as:  DRISDOL Take 50,000 Units by mouth every 7 (seven) days. Sundays       Allergies:  Allergies  Allergen Reactions  . Sulfa Antibiotics Itching  . Sulfasalazine Itching    Past Medical History, Surgical history, Social history, and Family History were reviewed and updated.  Review of Systems: Review of Systems  Constitutional: Negative.   HENT: Negative.   Eyes: Negative.   Respiratory: Negative.   Cardiovascular: Negative.   Gastrointestinal: Negative.   Genitourinary: Negative.   Musculoskeletal: Negative.   Skin: Negative.  Neurological: Positive for sensory change.  Endo/Heme/Allergies: Negative.   Psychiatric/Behavioral: Negative.      Physical Exam:  weight is 146 lb (66.2 kg). His oral temperature is 98.1 F (36.7 C). His blood pressure is 140/75 and his pulse is 59 (abnormal). His respiration is 18 and oxygen saturation is 100%.   Wt Readings from Last 3 Encounters:  03/05/18 146 lb (66.2 kg)  02/19/18 147 lb (66.7 kg)  02/05/18 148 lb (67.1 kg)    Ocular: Sclerae unicteric, pupils equal, round and reactive to light Ear-nose-throat: Oropharynx clear, dentition fair Lymphatic: No cervical, supraclavicular or axillary adenopathy Lungs no rales or rhonchi, good excursion bilaterally Heart regular rate and rhythm, no murmur appreciated Abd soft, nontender, positive bowel sounds, no liver or spleen  tip palpated on exam, no fluid wave  MSK no focal spinal tenderness, no joint edema Neuro: non-focal, well-oriented, appropriate affect Breasts: Deferred   Lab Results  Component Value Date   WBC 5.3 03/05/2018   HGB 10.0 (L) 03/05/2018   HCT 29.3 (L) 03/05/2018   MCV 92.1 03/05/2018   PLT 197 03/05/2018   Lab Results  Component Value Date   FERRITIN 48 02/05/2018   IRON 90 02/05/2018   TIBC 291 02/05/2018   UIBC 201 02/05/2018   IRONPCTSAT 31 02/05/2018   Lab Results  Component Value Date   RBC 3.18 (L) 03/05/2018   Lab Results  Component Value Date   KPAFRELGTCHN 18.5 02/19/2018   LAMBDASER 47.2 (H) 02/19/2018   KAPLAMBRATIO 0.39 02/19/2018   Lab Results  Component Value Date   IGGSERUM 774 02/19/2018   IGGSERUM 800 02/19/2018   IGA 64 02/19/2018   IGA 62 02/19/2018   IGMSERUM 18 02/19/2018   IGMSERUM 20 02/19/2018   Lab Results  Component Value Date   TOTALPROTELP 6.1 02/19/2018   ALBUMINELP 3.5 02/19/2018   A1GS 0.3 02/19/2018   A2GS 0.8 02/19/2018   BETS 0.8 02/19/2018   BETA2SER 0.3 07/28/2015   GAMS 0.7 02/19/2018   MSPIKE 0.4 (H) 02/19/2018   SPEI Comment 01/22/2018     Chemistry      Component Value Date/Time   NA 142 03/05/2018 1255   NA 146 (H) 08/07/2017 1153   NA 141 01/09/2017 1004   K 4.4 03/05/2018 1255   K 4.6 08/07/2017 1153   K 4.4 01/09/2017 1004   CL 112 (H) 03/05/2018 1255   CL 108 08/07/2017 1153   CO2 25 03/05/2018 1255   CO2 26 08/07/2017 1153   CO2 22 01/09/2017 1004   BUN 22 03/05/2018 1255   BUN 24 (H) 08/07/2017 1153   BUN 23.9 01/09/2017 1004   CREATININE 1.30 (H) 03/05/2018 1255   CREATININE 1.5 (H) 08/07/2017 1153   CREATININE 1.5 (H) 01/09/2017 1004      Component Value Date/Time   CALCIUM 9.1 03/05/2018 1255   CALCIUM 8.8 08/07/2017 1153   CALCIUM 8.8 01/09/2017 1004   ALKPHOS 78 03/05/2018 1255   ALKPHOS 97 (H) 08/07/2017 1153   ALKPHOS 80 01/09/2017 1004   AST 22 03/05/2018 1255   AST 18 01/09/2017  1004   ALT 20 03/05/2018 1255   ALT 20 08/07/2017 1153   ALT 12 01/09/2017 1004   BILITOT 1.1 03/05/2018 1255   BILITOT 0.92 01/09/2017 1004       Impression and Plan: Mr. Simkins is a very pleasant 84 yo caucasian gentleman with relapsed IgG lambda myeloma.   He is tolerating treatment well.   Again, we are going to  hold his treatment.  I will give him 3 weeks off.  I do not think this should be a problem with respect to his myeloma.  I just want to see that this submental infection heals up fully.  We will see him back in 3 weeks.  [name], MD 7/25/20191:52 PM

## 2018-03-06 LAB — KAPPA/LAMBDA LIGHT CHAINS
KAPPA, LAMDA LIGHT CHAIN RATIO: 0.39 (ref 0.26–1.65)
Kappa free light chain: 18.3 mg/L (ref 3.3–19.4)
Lambda free light chains: 47 mg/L — ABNORMAL HIGH (ref 5.7–26.3)

## 2018-03-06 LAB — IRON AND TIBC
IRON: 111 ug/dL (ref 42–163)
SATURATION RATIOS: 54 % (ref 42–163)
TIBC: 206 ug/dL (ref 202–409)
UIBC: 95 ug/dL

## 2018-03-06 LAB — IGG, IGA, IGM
IgA: 62 mg/dL (ref 61–437)
IgG (Immunoglobin G), Serum: 781 mg/dL (ref 700–1600)
IgM (Immunoglobulin M), Srm: 18 mg/dL (ref 15–143)

## 2018-03-06 LAB — FERRITIN: FERRITIN: 179 ng/mL (ref 24–336)

## 2018-03-08 LAB — PROTEIN ELECTROPHORESIS, SERUM
A/G RATIO SPE: 1.3 (ref 0.7–1.7)
Albumin ELP: 3.2 g/dL (ref 2.9–4.4)
Alpha-1-Globulin: 0.2 g/dL (ref 0.0–0.4)
Alpha-2-Globulin: 0.7 g/dL (ref 0.4–1.0)
Beta Globulin: 0.8 g/dL (ref 0.7–1.3)
GLOBULIN, TOTAL: 2.5 g/dL (ref 2.2–3.9)
Gamma Globulin: 0.8 g/dL (ref 0.4–1.8)
M-Spike, %: 0.5 g/dL — ABNORMAL HIGH
Total Protein ELP: 5.7 g/dL — ABNORMAL LOW (ref 6.0–8.5)

## 2018-03-09 ENCOUNTER — Other Ambulatory Visit: Payer: Self-pay | Admitting: Hematology & Oncology

## 2018-03-09 DIAGNOSIS — I10 Essential (primary) hypertension: Secondary | ICD-10-CM

## 2018-03-12 ENCOUNTER — Other Ambulatory Visit: Payer: Self-pay | Admitting: *Deleted

## 2018-03-19 ENCOUNTER — Ambulatory Visit: Payer: Medicare HMO | Admitting: Hematology & Oncology

## 2018-03-19 ENCOUNTER — Ambulatory Visit: Payer: Medicare HMO

## 2018-03-19 ENCOUNTER — Other Ambulatory Visit: Payer: Medicare HMO

## 2018-03-26 ENCOUNTER — Inpatient Hospital Stay (HOSPITAL_BASED_OUTPATIENT_CLINIC_OR_DEPARTMENT_OTHER): Payer: Medicare HMO | Admitting: Hematology & Oncology

## 2018-03-26 ENCOUNTER — Other Ambulatory Visit: Payer: Self-pay

## 2018-03-26 ENCOUNTER — Inpatient Hospital Stay: Payer: Medicare HMO

## 2018-03-26 ENCOUNTER — Inpatient Hospital Stay: Payer: Medicare HMO | Attending: Hematology & Oncology

## 2018-03-26 VITALS — BP 141/71 | HR 71 | Temp 98.2°F | Resp 18 | Wt 149.0 lb

## 2018-03-26 DIAGNOSIS — C9002 Multiple myeloma in relapse: Secondary | ICD-10-CM

## 2018-03-26 DIAGNOSIS — Z5112 Encounter for antineoplastic immunotherapy: Secondary | ICD-10-CM | POA: Insufficient documentation

## 2018-03-26 DIAGNOSIS — G629 Polyneuropathy, unspecified: Secondary | ICD-10-CM | POA: Diagnosis not present

## 2018-03-26 DIAGNOSIS — C9000 Multiple myeloma not having achieved remission: Secondary | ICD-10-CM

## 2018-03-26 DIAGNOSIS — N289 Disorder of kidney and ureter, unspecified: Secondary | ICD-10-CM

## 2018-03-26 DIAGNOSIS — Z5111 Encounter for antineoplastic chemotherapy: Secondary | ICD-10-CM | POA: Insufficient documentation

## 2018-03-26 DIAGNOSIS — D508 Other iron deficiency anemias: Secondary | ICD-10-CM

## 2018-03-26 DIAGNOSIS — D649 Anemia, unspecified: Secondary | ICD-10-CM | POA: Insufficient documentation

## 2018-03-26 DIAGNOSIS — C9001 Multiple myeloma in remission: Secondary | ICD-10-CM

## 2018-03-26 DIAGNOSIS — Z79899 Other long term (current) drug therapy: Secondary | ICD-10-CM | POA: Insufficient documentation

## 2018-03-26 LAB — CBC WITH DIFFERENTIAL (CANCER CENTER ONLY)
BASOS ABS: 0 10*3/uL (ref 0.0–0.1)
Basophils Relative: 1 %
EOS ABS: 0.3 10*3/uL (ref 0.0–0.5)
Eosinophils Relative: 6 %
HEMATOCRIT: 29.7 % — AB (ref 38.7–49.9)
HEMOGLOBIN: 10 g/dL — AB (ref 13.0–17.1)
Lymphocytes Relative: 9 %
Lymphs Abs: 0.4 10*3/uL — ABNORMAL LOW (ref 0.9–3.3)
MCH: 31.9 pg (ref 28.0–33.4)
MCHC: 33.7 g/dL (ref 32.0–35.9)
MCV: 94.9 fL (ref 82.0–98.0)
Monocytes Absolute: 0.6 10*3/uL (ref 0.1–0.9)
Monocytes Relative: 14 %
NEUTROS ABS: 3.3 10*3/uL (ref 1.5–6.5)
NEUTROS PCT: 70 %
Platelet Count: 176 10*3/uL (ref 145–400)
RBC: 3.13 MIL/uL — ABNORMAL LOW (ref 4.20–5.70)
RDW: 13.7 % (ref 11.1–15.7)
WBC: 4.6 10*3/uL (ref 4.0–10.0)

## 2018-03-26 LAB — CMP (CANCER CENTER ONLY)
ALBUMIN: 3.2 g/dL — AB (ref 3.5–5.0)
ALK PHOS: 71 U/L (ref 26–84)
ALT: 19 U/L (ref 10–47)
AST: 29 U/L (ref 11–38)
Anion gap: 6 (ref 5–15)
BILIRUBIN TOTAL: 0.9 mg/dL (ref 0.2–1.6)
BUN: 28 mg/dL — AB (ref 7–22)
CALCIUM: 8.9 mg/dL (ref 8.0–10.3)
CO2: 24 mmol/L (ref 18–33)
CREATININE: 1.6 mg/dL — AB (ref 0.60–1.20)
Chloride: 110 mmol/L — ABNORMAL HIGH (ref 98–108)
Glucose, Bld: 88 mg/dL (ref 73–118)
Potassium: 4.7 mmol/L (ref 3.3–4.7)
SODIUM: 140 mmol/L (ref 128–145)
Total Protein: 6.2 g/dL — ABNORMAL LOW (ref 6.4–8.1)

## 2018-03-26 MED ORDER — DEXTROSE 5 % IV SOLN
60.0000 mg | Freq: Once | INTRAVENOUS | Status: AC
  Administered 2018-03-26: 60 mg via INTRAVENOUS
  Filled 2018-03-26: qty 30

## 2018-03-26 MED ORDER — DEXAMETHASONE SODIUM PHOSPHATE 10 MG/ML IJ SOLN
10.0000 mg | Freq: Once | INTRAMUSCULAR | Status: AC
  Administered 2018-03-26: 10 mg via INTRAVENOUS

## 2018-03-26 MED ORDER — SODIUM CHLORIDE 0.9 % IV SOLN
300.0000 mg/m2 | Freq: Once | INTRAVENOUS | Status: AC
  Administered 2018-03-26: 540 mg via INTRAVENOUS
  Filled 2018-03-26: qty 27

## 2018-03-26 MED ORDER — DEXAMETHASONE SODIUM PHOSPHATE 10 MG/ML IJ SOLN
INTRAMUSCULAR | Status: AC
  Filled 2018-03-26: qty 1

## 2018-03-26 MED ORDER — SODIUM CHLORIDE 0.9 % IV SOLN
Freq: Once | INTRAVENOUS | Status: AC
  Administered 2018-03-26: 14:00:00 via INTRAVENOUS
  Filled 2018-03-26: qty 250

## 2018-03-26 MED ORDER — SODIUM CHLORIDE 0.9 % IV SOLN
Freq: Once | INTRAVENOUS | Status: AC
Start: 2018-03-26 — End: 2018-03-26
  Administered 2018-03-26: 14:00:00 via INTRAVENOUS
  Filled 2018-03-26: qty 250

## 2018-03-26 MED ORDER — PALONOSETRON HCL INJECTION 0.25 MG/5ML
0.2500 mg | Freq: Once | INTRAVENOUS | Status: AC
  Administered 2018-03-26: 0.25 mg via INTRAVENOUS

## 2018-03-26 MED ORDER — PALONOSETRON HCL INJECTION 0.25 MG/5ML
INTRAVENOUS | Status: AC
  Filled 2018-03-26: qty 5

## 2018-03-26 NOTE — Progress Notes (Signed)
Hematology and Oncology Follow Up Visit  Christian Burns [id number] 1930-04-06 84 y.o. 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  Now looks like we do have a diagnosis for the facial lesion.  Looks like he developed a dental sinus.  This was discovered by the dermatologist.  Dr. Link Snuffer clearly is brilliant to have been able to make that diagnosis.  Christian Burns will see his dentist I think next week.  As I like he may need to have some teeth taken out.  We did not treat him the last time we saw him because of this issue with his chin.  He is ready now.  His last myeloma studies were all stable.  His M spike was 0.5 g/dL.  His IgG level was 781 mg/dL.  His lambda light chain was 4.7 mg/dL.  He still has some neuropathy.  This is chronic.  Christian Burns has noted a little bit more the way of lower back discomfort.  He has had some hip problems in the past.  There is no issues with bowels or bladder.  His appetite is still doing okay.  Overall, his performance status is ECOG 1.    Medications:  Allergies as of 03/26/2018      Reactions   Sulfa Antibiotics Itching   Sulfasalazine Itching      Medication List        Accurate as of 03/26/18  2:17 PM. Always use your most recent med list.          amLODipine-benazepril 5-10 MG capsule Commonly known as:  LOTREL TAKE 1 CAPSULE BY  MOUTH DAILY   MULTIVITAL PO Take 1 tablet by mouth every morning.   pyridOXINE 100 MG tablet Commonly known as:  VITAMIN B-6 Take 100 mg by mouth daily.   VITAMIN B 12 PO Take 1,000 mcg by mouth every morning.   Vitamin D (Ergocalciferol) 50000 units Caps capsule Commonly known as:  DRISDOL Take 50,000 Units by mouth every 7 (seven) days. Sundays       Allergies:  Allergies  Allergen Reactions  . Sulfa Antibiotics Itching  . Sulfasalazine Itching    Past Medical History, Surgical history, Social history, and Family History were reviewed and updated.  Review of Systems: Review of Systems  Constitutional: Negative.   HENT: Negative.   Eyes: Negative.   Respiratory: Negative.   Cardiovascular: Negative.   Gastrointestinal: Negative.   Genitourinary: Negative.   Musculoskeletal: Negative.   Skin: Negative.   Neurological: Positive for sensory change.  Endo/Heme/Allergies: Negative.   Psychiatric/Behavioral: Negative.      Physical Exam:  weight is 149 lb (67.6 kg). His oral temperature is 98.2 F (36.8 C). His blood pressure is 141/71 (abnormal) and his pulse is 71. His respiration is 18 and  oxygen saturation is 100%.   Wt Readings from Last 3 Encounters:  03/26/18 149 lb (67.6 kg)  03/05/18 146 lb (66.2 kg)  02/19/18 147 lb (66.7 kg)    Ocular: Sclerae unicteric, pupils equal, round and reactive to light Ear-nose-throat: Oropharynx clear, dentition fair Lymphatic: No cervical, supraclavicular or axillary adenopathy Lungs no rales or rhonchi, good excursion bilaterally Heart regular rate and rhythm, no murmur appreciated Abd soft, nontender, positive bowel sounds, no liver or spleen tip palpated on exam, no fluid wave  MSK no focal spinal tenderness, no joint edema Neuro: non-focal, well-oriented, appropriate affect Breasts: Deferred   Lab Results  Component Value Date   WBC 4.6 03/26/2018   HGB 10.0 (L) 03/26/2018   HCT 29.7 (L) 03/26/2018   MCV 94.9  03/26/2018   PLT 176 03/26/2018   Lab Results  Component Value Date   FERRITIN 179 03/05/2018   IRON 111 03/05/2018   TIBC 206 03/05/2018   UIBC 95 03/05/2018   IRONPCTSAT 54 03/05/2018   Lab Results  Component Value Date   RBC 3.13 (L) 03/26/2018   Lab Results  Component Value Date   KPAFRELGTCHN 18.3 03/05/2018   LAMBDASER 47.0 (H) 03/05/2018   KAPLAMBRATIO 0.39 03/05/2018   Lab Results  Component Value Date   IGGSERUM 781 03/05/2018   IGA 62 03/05/2018   IGMSERUM 18 03/05/2018   Lab Results  Component Value Date   TOTALPROTELP 5.7 (L) 03/05/2018   ALBUMINELP 3.2 03/05/2018   A1GS 0.2 03/05/2018   A2GS 0.7 03/05/2018   BETS 0.8 03/05/2018   BETA2SER 0.3 07/28/2015   GAMS 0.8 03/05/2018   MSPIKE 0.5 (H) 03/05/2018   SPEI Comment 03/05/2018     Chemistry      Component Value Date/Time   NA 140 03/26/2018 1311   NA 146 (H) 08/07/2017 1153   NA 141 01/09/2017 1004   K 4.7 03/26/2018 1311   K 4.6 08/07/2017 1153   K 4.4 01/09/2017 1004   CL 110 (H) 03/26/2018 1311   CL 108 08/07/2017 1153   CO2 24 03/26/2018 1311   CO2 26 08/07/2017 1153   CO2 22 01/09/2017 1004   BUN 28 (H) 03/26/2018 1311   BUN 24 (H) 08/07/2017 1153   BUN 23.9 01/09/2017 1004   CREATININE 1.60 (H) 03/26/2018 1311   CREATININE 1.5 (H) 08/07/2017 1153   CREATININE 1.5 (H) 01/09/2017 1004      Component Value Date/Time   CALCIUM 8.9 03/26/2018 1311   CALCIUM 8.8 08/07/2017 1153   CALCIUM 8.8 01/09/2017 1004   ALKPHOS 71 03/26/2018 1311   ALKPHOS 97 (H) 08/07/2017 1153   ALKPHOS 80 01/09/2017 1004   AST 29 03/26/2018 1311   AST 18 01/09/2017 1004   ALT 19 03/26/2018 1311   ALT 20 08/07/2017 1153   ALT 12 01/09/2017 1004   BILITOT 0.9 03/26/2018 1311   BILITOT 0.92 01/09/2017 1004       Impression and Plan: Christian Burns is a very pleasant 84 yo caucasian gentleman with relapsed IgG lambda myeloma.   He is tolerating treatment well.   Since everything is doing okay, we will  continue him on therapy right now.  I do not see that we have to make any changes.  We will plan to get him back in another 2 weeks.  Possibly, we might be able to move treatments to every 3 weeks if things continue to stabilize.  [name], MD 8/15/20192:17 PM

## 2018-03-26 NOTE — Patient Instructions (Signed)
[facility] Discharge Instructions for Patients Receiving Chemotherapy  Today you received the following chemotherapy agents Kyprolis/Cytoxan To help prevent nausea and vomiting after your treatment, we encourage you to take your nausea medication as prescri   If you develop nausea and vomiting that is not controlled by your nausea medication, call the clinic.   BELOW ARE SYMPTOMS THAT SHOULD BE REPORTED IMMEDIATELY:  *FEVER GREATER THAN 100.5 F  *CHILLS WITH OR WITHOUT FEVER  NAUSEA AND VOMITING THAT IS NOT CONTROLLED WITH YOUR NAUSEA MEDICATION  *UNUSUAL SHORTNESS OF BREATH  *UNUSUAL BRUISING OR BLEEDING  TENDERNESS IN MOUTH AND THROAT WITH OR WITHOUT PRESENCE OF ULCERS  *URINARY PROBLEMS  *BOWEL PROBLEMS  UNUSUAL RASH Items with * indicate a potential emergency and should be followed up as soon as possible.  Feel free to call the clinic should you have any questions or concerns. The clinic phone number is [phone].  Please show the [facility] at check-in to the Emergency Department and triage nurse.

## 2018-03-27 LAB — IGG, IGA, IGM
IGA: 63 mg/dL (ref 61–437)
IGG (IMMUNOGLOBIN G), SERUM: 780 mg/dL (ref 700–1600)
IGM (IMMUNOGLOBULIN M), SRM: 19 mg/dL (ref 15–143)

## 2018-03-27 LAB — IRON AND TIBC
Iron: 87 ug/dL (ref 42–163)
SATURATION RATIOS: 36 % — AB (ref 42–163)
TIBC: 245 ug/dL (ref 202–409)
UIBC: 158 ug/dL

## 2018-03-27 LAB — KAPPA/LAMBDA LIGHT CHAINS
Kappa free light chain: 18.3 mg/L (ref 3.3–19.4)
Kappa, lambda light chain ratio: 0.28 (ref 0.26–1.65)
Lambda free light chains: 65.6 mg/L — ABNORMAL HIGH (ref 5.7–26.3)

## 2018-03-27 LAB — FERRITIN: Ferritin: 118 ng/mL (ref 24–336)

## 2018-03-30 LAB — PROTEIN ELECTROPHORESIS, SERUM, WITH REFLEX
A/G Ratio: 1.6 (ref 0.7–1.7)
ALPHA-1-GLOBULIN: 0.2 g/dL (ref 0.0–0.4)
Albumin ELP: 3.6 g/dL (ref 2.9–4.4)
Alpha-2-Globulin: 0.7 g/dL (ref 0.4–1.0)
Beta Globulin: 0.8 g/dL (ref 0.7–1.3)
GAMMA GLOBULIN: 0.7 g/dL (ref 0.4–1.8)
Globulin, Total: 2.3 g/dL (ref 2.2–3.9)
M-Spike, %: 0.4 g/dL — ABNORMAL HIGH
SPEP Interpretation: 0
TOTAL PROTEIN ELP: 5.9 g/dL — AB (ref 6.0–8.5)

## 2018-03-30 LAB — IMMUNOFIXATION REFLEX, SERUM
IGM (IMMUNOGLOBULIN M), SRM: 19 mg/dL (ref 15–143)
IgA: 64 mg/dL (ref 61–437)
IgG (Immunoglobin G), Serum: 801 mg/dL (ref 700–1600)

## 2018-04-09 ENCOUNTER — Inpatient Hospital Stay: Payer: Medicare HMO

## 2018-04-09 ENCOUNTER — Encounter: Payer: Self-pay | Admitting: Hematology & Oncology

## 2018-04-09 ENCOUNTER — Other Ambulatory Visit: Payer: Self-pay

## 2018-04-09 ENCOUNTER — Inpatient Hospital Stay (HOSPITAL_BASED_OUTPATIENT_CLINIC_OR_DEPARTMENT_OTHER): Payer: Medicare HMO | Admitting: Hematology & Oncology

## 2018-04-09 VITALS — BP 130/68 | HR 71 | Temp 98.1°F | Resp 16 | Wt 152.0 lb

## 2018-04-09 DIAGNOSIS — G629 Polyneuropathy, unspecified: Secondary | ICD-10-CM

## 2018-04-09 DIAGNOSIS — C9000 Multiple myeloma not having achieved remission: Secondary | ICD-10-CM

## 2018-04-09 DIAGNOSIS — C9001 Multiple myeloma in remission: Secondary | ICD-10-CM

## 2018-04-09 DIAGNOSIS — N289 Disorder of kidney and ureter, unspecified: Secondary | ICD-10-CM

## 2018-04-09 DIAGNOSIS — Z5111 Encounter for antineoplastic chemotherapy: Secondary | ICD-10-CM | POA: Diagnosis not present

## 2018-04-09 DIAGNOSIS — D649 Anemia, unspecified: Secondary | ICD-10-CM | POA: Diagnosis not present

## 2018-04-09 DIAGNOSIS — Z79899 Other long term (current) drug therapy: Secondary | ICD-10-CM

## 2018-04-09 DIAGNOSIS — Z5112 Encounter for antineoplastic immunotherapy: Secondary | ICD-10-CM | POA: Diagnosis not present

## 2018-04-09 LAB — CBC WITH DIFFERENTIAL (CANCER CENTER ONLY)
BASOS ABS: 0.1 10*3/uL (ref 0.0–0.1)
Basophils Relative: 1 %
Eosinophils Absolute: 0.3 10*3/uL (ref 0.0–0.5)
Eosinophils Relative: 6 %
HEMATOCRIT: 28.9 % — AB (ref 38.7–49.9)
Hemoglobin: 9.8 g/dL — ABNORMAL LOW (ref 13.0–17.1)
LYMPHS ABS: 0.3 10*3/uL — AB (ref 0.9–3.3)
LYMPHS PCT: 6 %
MCH: 31.9 pg (ref 28.0–33.4)
MCHC: 33.9 g/dL (ref 32.0–35.9)
MCV: 94.1 fL (ref 82.0–98.0)
Monocytes Absolute: 0.7 10*3/uL (ref 0.1–0.9)
Monocytes Relative: 15 %
NEUTROS ABS: 3.5 10*3/uL (ref 1.5–6.5)
Neutrophils Relative %: 72 %
PLATELETS: 212 10*3/uL (ref 145–400)
RBC: 3.07 MIL/uL — AB (ref 4.20–5.70)
RDW: 14.1 % (ref 11.1–15.7)
WBC: 4.8 10*3/uL (ref 4.0–10.0)

## 2018-04-09 LAB — CMP (CANCER CENTER ONLY)
ALT: 22 U/L (ref 10–47)
ANION GAP: 5 (ref 5–15)
AST: 25 U/L (ref 11–38)
Albumin: 3.2 g/dL — ABNORMAL LOW (ref 3.5–5.0)
Alkaline Phosphatase: 68 U/L (ref 26–84)
BUN: 26 mg/dL — ABNORMAL HIGH (ref 7–22)
CALCIUM: 8.7 mg/dL (ref 8.0–10.3)
CO2: 25 mmol/L (ref 18–33)
Chloride: 109 mmol/L — ABNORMAL HIGH (ref 98–108)
Creatinine: 1.6 mg/dL — ABNORMAL HIGH (ref 0.60–1.20)
GLUCOSE: 108 mg/dL (ref 73–118)
Potassium: 5 mmol/L — ABNORMAL HIGH (ref 3.3–4.7)
SODIUM: 139 mmol/L (ref 128–145)
Total Bilirubin: 0.9 mg/dL (ref 0.2–1.6)
Total Protein: 6.2 g/dL — ABNORMAL LOW (ref 6.4–8.1)

## 2018-04-09 NOTE — Progress Notes (Signed)
Hematology and Oncology Follow Up Visit  COALTON ARCH [id number] 1930/08/08 84 y.o. 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 Sinning is here today for follow-up.  And now looks like there is a new diagnosis for what is going on with his jaw.  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  This would make sense.  We have not given him Zometa for quite a while.  He actually is going to have surgery on September3rd.  As such, we are going to adjust his treatments and not treat him today.  Next treatment will be on September 19.  His last myeloma studies show that everything is still incredibly stable.  His M spike was 0.4 g/dL.  As such, I think we can adjust his treatments to every 3 weeks.  I think this would be a little bit more tolerable for him.  There is been no fever.  He has had no problems eating.  He has had no cough or shortness of breath.  He has had no problems with bowels or bladder.  Overall, his performance status is ECOG 1-2.    Medications:  Allergies as of 04/09/2018      Reactions   Sulfa Antibiotics Itching   Sulfasalazine Itching      Medication List        Accurate as of 04/09/18  1:48 PM. Always use your most recent med list.          amLODipine-benazepril 5-10 MG capsule Commonly known as:  LOTREL TAKE 1 CAPSULE BY MOUTH DAILY   chlorhexidine 0.12 % solution Commonly known as:  PERIDEX SWISH WITH  1/2 OUNCE FOR 30 SECONDS THEN SPIT BID   MULTIVITAL PO Take 1 tablet by mouth every morning.   penicillin v potassium 500 MG tablet Commonly known as:  VEETID Take 500 mg by mouth QID.   pyridOXINE 100 MG tablet Commonly known as:  VITAMIN B-6 Take 100 mg by mouth daily.   VITAMIN B 12 PO Take 1,000 mcg by mouth every morning.   Vitamin D (Ergocalciferol) 50000 units Caps capsule Commonly known as:  DRISDOL Take 50,000 Units by mouth every 7 (seven) days. Sundays       Allergies:  Allergies  Allergen Reactions  . Sulfa Antibiotics Itching  . Sulfasalazine Itching    Past Medical History, Surgical history, Social history, and Family History were reviewed and updated.  Review of Systems: Review of Systems  Constitutional: Negative.   HENT: Negative.   Eyes: Negative.   Respiratory: Negative.   Cardiovascular: Negative.   Gastrointestinal: Negative.   Genitourinary: Negative.   Musculoskeletal: Negative.   Skin: Negative.   Neurological: Positive for sensory change.  Endo/Heme/Allergies: Negative.   Psychiatric/Behavioral: Negative.      Physical Exam:  weight is 152 lb (68.9 kg). His oral temperature is 98.1 F (36.7 C). His blood pressure is 130/68 and his  pulse is 71. His respiration is 16 and oxygen saturation is 99%.   Wt Readings from Last 3 Encounters:  04/09/18 152 lb (68.9 kg)  03/26/18 149 lb (67.6 kg)  03/05/18 146 lb (66.2 kg)    Ocular: Sclerae unicteric, pupils equal, round and reactive to light Ear-nose-throat: Oropharynx clear, dentition fair Lymphatic: No cervical, supraclavicular or axillary adenopathy Lungs no rales or rhonchi, good excursion bilaterally Heart regular rate and rhythm, no murmur appreciated Abd soft, nontender, positive bowel sounds, no liver or spleen tip palpated on exam, no fluid wave  MSK no focal spinal tenderness, no joint edema Neuro: non-focal, well-oriented, appropriate affect Breasts: Deferred   Lab Results   Component Value Date   WBC 4.8 04/09/2018   HGB 9.8 (L) 04/09/2018   HCT 28.9 (L) 04/09/2018   MCV 94.1 04/09/2018   PLT 212 04/09/2018   Lab Results  Component Value Date   FERRITIN 118 03/26/2018   IRON 87 03/26/2018   TIBC 245 03/26/2018   UIBC 158 03/26/2018   IRONPCTSAT 36 (L) 03/26/2018   Lab Results  Component Value Date   RBC 3.07 (L) 04/09/2018   Lab Results  Component Value Date   KPAFRELGTCHN 18.3 03/26/2018   LAMBDASER 65.6 (H) 03/26/2018   KAPLAMBRATIO 0.28 03/26/2018   Lab Results  Component Value Date   IGGSERUM 780 03/26/2018   IGGSERUM 801 03/26/2018   IGA 63 03/26/2018   IGA 64 03/26/2018   IGMSERUM 19 03/26/2018   IGMSERUM 19 03/26/2018   Lab Results  Component Value Date   TOTALPROTELP 5.9 (L) 03/26/2018   ALBUMINELP 3.6 03/26/2018   A1GS 0.2 03/26/2018   A2GS 0.7 03/26/2018   BETS 0.8 03/26/2018   BETA2SER 0.3 07/28/2015   GAMS 0.7 03/26/2018   MSPIKE 0.4 (H) 03/26/2018   SPEI Comment 03/05/2018     Chemistry      Component Value Date/Time   NA 139 04/09/2018 1257   NA 146 (H) 08/07/2017 1153   NA 141 01/09/2017 1004   K 5.0 (H) 04/09/2018 1257   K 4.6 08/07/2017 1153   K 4.4 01/09/2017 1004   CL 109 (H) 04/09/2018 1257   CL 108 08/07/2017 1153   CO2 25 04/09/2018 1257   CO2 26 08/07/2017 1153   CO2 22 01/09/2017 1004   BUN 26 (H) 04/09/2018 1257   BUN 24 (H) 08/07/2017 1153   BUN 23.9 01/09/2017 1004   CREATININE 1.60 (H) 04/09/2018 1257   CREATININE 1.5 (H) 08/07/2017 1153   CREATININE 1.5 (H) 01/09/2017 1004      Component Value Date/Time   CALCIUM 8.7 04/09/2018 1257   CALCIUM 8.8 08/07/2017 1153   CALCIUM 8.8 01/09/2017 1004   ALKPHOS 68 04/09/2018 1257   ALKPHOS 97 (H) 08/07/2017 1153   ALKPHOS 80 01/09/2017 1004   AST 25 04/09/2018 1257   AST 18 01/09/2017 1004   ALT 22 04/09/2018 1257   ALT 20 08/07/2017 1153   ALT 12 01/09/2017 1004   BILITOT 0.9 04/09/2018 1257   BILITOT 0.92 01/09/2017 1004        Impression and Plan: Mr. Aguilera is a very pleasant 84 yo caucasian gentleman with relapsed IgG lambda myeloma.  I am glad that he is going to have the surgery.  Hopefully this will finally take care of this issue that he has had with the draw.  I will plan to get him back probably in close to a month.  Again I think the time off would do  him good.  I will help with surgical recovery.  Thankfully, we have not given him Zometa for quite a while.    [name], MD 8/29/20191:48 PM

## 2018-04-10 ENCOUNTER — Other Ambulatory Visit: Payer: Self-pay | Admitting: Hematology & Oncology

## 2018-04-10 DIAGNOSIS — I10 Essential (primary) hypertension: Secondary | ICD-10-CM

## 2018-04-10 LAB — KAPPA/LAMBDA LIGHT CHAINS
KAPPA FREE LGHT CHN: 18.3 mg/L (ref 3.3–19.4)
KAPPA, LAMDA LIGHT CHAIN RATIO: 0.27 (ref 0.26–1.65)
LAMDA FREE LIGHT CHAINS: 67.3 mg/L — AB (ref 5.7–26.3)

## 2018-04-10 LAB — IGG, IGA, IGM
IGM (IMMUNOGLOBULIN M), SRM: 18 mg/dL (ref 15–143)
IgA: 57 mg/dL — ABNORMAL LOW (ref 61–437)
IgG (Immunoglobin G), Serum: 772 mg/dL (ref 700–1600)

## 2018-04-13 LAB — PROTEIN ELECTROPHORESIS, SERUM, WITH REFLEX
A/G Ratio: 1.3 (ref 0.7–1.7)
ALPHA-2-GLOBULIN: 0.7 g/dL (ref 0.4–1.0)
Albumin ELP: 3.1 g/dL (ref 2.9–4.4)
Alpha-1-Globulin: 0.2 g/dL (ref 0.0–0.4)
BETA GLOBULIN: 0.7 g/dL (ref 0.7–1.3)
GLOBULIN, TOTAL: 2.3 g/dL (ref 2.2–3.9)
Gamma Globulin: 0.7 g/dL (ref 0.4–1.8)
M-SPIKE, %: 0.4 g/dL — AB
SPEP Interpretation: 0
Total Protein ELP: 5.4 g/dL — ABNORMAL LOW (ref 6.0–8.5)

## 2018-04-13 LAB — IMMUNOFIXATION REFLEX, SERUM
IGA: 57 mg/dL — AB (ref 61–437)
IGG (IMMUNOGLOBIN G), SERUM: 765 mg/dL (ref 700–1600)
IGM (IMMUNOGLOBULIN M), SRM: 17 mg/dL (ref 15–143)

## 2018-04-17 ENCOUNTER — Telehealth: Payer: Self-pay | Admitting: *Deleted

## 2018-04-17 NOTE — Telephone Encounter (Signed)
Patient calling to say his oral surgery has been cancelled. Does dr Marin Olp want to move his chemo treatments up? Per dr Marin Olp keep chemo appts for 04/30/18. Patient verbalized understanding.

## 2018-04-23 ENCOUNTER — Ambulatory Visit: Payer: Medicare HMO

## 2018-04-23 ENCOUNTER — Other Ambulatory Visit: Payer: Medicare HMO

## 2018-04-28 ENCOUNTER — Encounter: Payer: Self-pay | Admitting: Internal Medicine

## 2018-04-28 ENCOUNTER — Ambulatory Visit (INDEPENDENT_AMBULATORY_CARE_PROVIDER_SITE_OTHER): Payer: Medicare HMO | Admitting: Internal Medicine

## 2018-04-28 VITALS — BP 150/86 | HR 74 | Temp 98.2°F | Wt 150.3 lb

## 2018-04-28 DIAGNOSIS — M272 Inflammatory conditions of jaws: Secondary | ICD-10-CM | POA: Diagnosis not present

## 2018-04-28 DIAGNOSIS — C9002 Multiple myeloma in relapse: Secondary | ICD-10-CM

## 2018-04-28 DIAGNOSIS — Z23 Encounter for immunization: Secondary | ICD-10-CM | POA: Diagnosis not present

## 2018-04-28 MED ORDER — PENICILLIN V POTASSIUM 500 MG PO TABS: 500.0000 mg | ORAL_TABLET | Freq: Four times a day (QID) | ORAL | 5 refills | Status: DC

## 2018-04-28 NOTE — Progress Notes (Signed)
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   MRN: [id number]  HPI Christian Burns is an 84 yo M with recurrent IgG lambda myeloma followed by dr Marin Olp. He was referred by Dr Buelah Manis for follow up on jaw osteomyelitis. He states that he currently has some Molar discomfort. [place] feels like this is due to gum disease  The jaw osteo is thought to have affected his front teeth lower which have been subsequently extracted. He feels that the Healing process too slow and questions to whether there is bone exposure on medial aspect of one of healing areas. 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 amLODipine-benazepril (LOTREL) 5-10 MG capsule TAKE 1 CAPSULE BY MOUTH DAILY  . Cyanocobalamin (VITAMIN B 12 PO) Take 1,000 mcg by mouth every morning.   . Multiple Vitamins-Minerals (MULTIVITAL PO) Take 1 tablet by mouth every morning.   . penicillin v potassium (VEETID) 500 MG tablet Take 500 mg by mouth QID.  Marland Kitchen pyridOXINE (VITAMIN B-6) 100 MG tablet Take 100 mg by mouth daily.   . Vitamin D, Ergocalciferol, (DRISDOL) 50000 UNITS CAPS capsule Take 50,000 Units by mouth every 7 (seven) days. Sundays  . chlorhexidine (PERIDEX) 0.12 % solution SWISH WITH 1/2 OUNCE FOR 30 SECONDS THEN SPIT BID   No facility-administered encounter medications on file as of 04/28/2018.      Patient Active Problem List   Diagnosis Date  Noted  . Iron deficiency anemia secondary to inadequate dietary iron intake 07/18/2016  . Multiple myeloma in relapse ([facility]) 04/05/2016  . 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 Multiple myeloma in remission ([facility]) 09/08/2015  . Myeloma ([facility]) 08/23/2011     Health Maintenance Due  Topic Date Due  . TETANUS/TDAP  05/01/1951  . PNA vac Low Risk Adult (1 of 2 - PCV13) 04/30/1997  . INFLUENZA VACCINE  03/12/2018    family history is not on file.   Social History   Tobacco Use  . Smoking status: Never Smoker  . Smokeless tobacco: Never Used  . Tobacco comment: never used tobacco  Substance Use Topics  . Alcohol use: Yes    Alcohol/week: 0.0 standard drinks    Comment: Wine Occassionally  . Drug use: No   Review of Systems Review of Systems  Constitutional: Negative for fever, chills, diaphoresis, activity change, appetite change, fatigue and unexpected weight change.  HENT: Negative for congestion, sore throat, rhinorrhea, sneezing, trouble swallowing and sinus pressure.  Eyes: Negative for photophobia and visual disturbance.  Respiratory: Negative for cough, chest tightness, shortness of breath, wheezing and stridor.  Cardiovascular: Negative for chest pain, palpitations and leg swelling.  Gastrointestinal: Negative for nausea, vomiting, abdominal pain, diarrhea, constipation, blood in stool, abdominal distention and anal bleeding.  Genitourinary: Negative for dysuria, hematuria, flank pain and difficulty urinating.  Musculoskeletal: Negative for myalgias, back pain, joint swelling, arthralgias and gait problem.  Skin: Negative for color change, pallor, rash and wound.  Neurological: Negative  for dizziness, tremors, weakness and light-headedness.  Hematological: Negative for adenopathy. Does not bruise/bleed easily.  Psychiatric/Behavioral: Negative for behavioral problems, confusion, sleep disturbance, dysphoric mood, decreased concentration and agitation.     Physical Exam   BP (!) 150/86   Pulse 74   Temp 98.2 F (36.8 C)   Wt 150 lb 4.8 oz (68.2 kg)   BMI 21.57 kg/m   Physical Exam  Constitutional: He is oriented to person, place, and time. He appears well-developed and well-nourished. No distress.  HENT: bottom tooth extraction, possible bone exposure at base of extraction Mouth/Throat: Oropharynx is clear and moist. No oropharyngeal exudate.  Cardiovascular: Normal rate, regular rhythm and normal heart sounds. Exam reveals no gallop and no friction rub.  No murmur heard.  Pulmonary/Chest: Effort normal and breath sounds normal. No respiratory distress. He has no wheezes.  Abdominal: Soft. Bowel sounds are normal. He exhibits no distension. There is no tenderness.  Lymphadenopathy:  He has no cervical adenopathy.  Neurological: He is alert and oriented to person, place, and time.  Skin: Skin is warm and dry. No rash noted. No erythema.  Psychiatric: He has a normal mood and affect. His behavior is normal.    CBC Lab Results  Component Value Date   WBC 4.8 04/09/2018   RBC 3.07 (L) 04/09/2018   HGB 9.8 (L) 04/09/2018   HCT 28.9 (L) 04/09/2018   PLT 212 04/09/2018   MCV 94.1 04/09/2018   MCH 31.9 04/09/2018   MCHC 33.9 04/09/2018   RDW 14.1 04/09/2018   LYMPHSABS 0.3 (L) 04/09/2018   MONOABS 0.7 04/09/2018   EOSABS 0.3 04/09/2018    BMET Lab Results  Component Value Date   NA 139 04/09/2018   K 5.0 (H) 04/09/2018   CL 109 (H) 04/09/2018   CO2 25 04/09/2018   GLUCOSE 108 04/09/2018   BUN 26 (H) 04/09/2018   CREATININE 1.60 (H) 04/09/2018   CALCIUM 8.7 04/09/2018   GFRNONAA 59 (L) 08/21/2017   GFRAA >60 08/21/2017   Lab Results  Component Value Date   ESRSEDRATE 31 (H) 04/28/2018   Lab Results  Component Value Date   CRP 4.5 04/28/2018      Assessment and Plan  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 If it stalls out then switch to iv therapy. Will check  inflammatory markers  Myeloma = discuss if having any further immunosuppressive regimen that might interfere with healing  Spent 45 min iwht patient with greater than 50% explaining treatment course for osteomyelitis

## 2018-04-29 LAB — C-REACTIVE PROTEIN: CRP: 4.5 mg/L (ref <8.0)

## 2018-04-29 LAB — SEDIMENTATION RATE: SED RATE: 31 mm/h — AB (ref 0–20)

## 2018-04-30 ENCOUNTER — Inpatient Hospital Stay (HOSPITAL_BASED_OUTPATIENT_CLINIC_OR_DEPARTMENT_OTHER): Payer: Medicare HMO | Admitting: Hematology & Oncology

## 2018-04-30 ENCOUNTER — Inpatient Hospital Stay: Payer: Medicare HMO

## 2018-04-30 ENCOUNTER — Other Ambulatory Visit: Payer: Self-pay

## 2018-04-30 ENCOUNTER — Encounter: Payer: Self-pay | Admitting: Hematology & Oncology

## 2018-04-30 ENCOUNTER — Inpatient Hospital Stay: Payer: Medicare HMO | Attending: Hematology & Oncology

## 2018-04-30 VITALS — BP 139/78 | HR 69 | Temp 97.9°F | Resp 18 | Wt 152.0 lb

## 2018-04-30 DIAGNOSIS — Z5112 Encounter for antineoplastic immunotherapy: Secondary | ICD-10-CM | POA: Insufficient documentation

## 2018-04-30 DIAGNOSIS — Z792 Long term (current) use of antibiotics: Secondary | ICD-10-CM

## 2018-04-30 DIAGNOSIS — C9002 Multiple myeloma in relapse: Secondary | ICD-10-CM

## 2018-04-30 DIAGNOSIS — G629 Polyneuropathy, unspecified: Secondary | ICD-10-CM | POA: Insufficient documentation

## 2018-04-30 DIAGNOSIS — B999 Unspecified infectious disease: Secondary | ICD-10-CM

## 2018-04-30 DIAGNOSIS — Z5111 Encounter for antineoplastic chemotherapy: Secondary | ICD-10-CM | POA: Diagnosis present

## 2018-04-30 DIAGNOSIS — C9000 Multiple myeloma not having achieved remission: Secondary | ICD-10-CM

## 2018-04-30 DIAGNOSIS — C9001 Multiple myeloma in remission: Secondary | ICD-10-CM

## 2018-04-30 LAB — CBC WITH DIFFERENTIAL (CANCER CENTER ONLY)
BASOS PCT: 1 %
Basophils Absolute: 0 10*3/uL (ref 0.0–0.1)
Eosinophils Absolute: 0.2 10*3/uL (ref 0.0–0.5)
Eosinophils Relative: 5 %
HEMATOCRIT: 30 % — AB (ref 38.7–49.9)
HEMOGLOBIN: 10.3 g/dL — AB (ref 13.0–17.1)
Lymphocytes Relative: 8 %
Lymphs Abs: 0.4 10*3/uL — ABNORMAL LOW (ref 0.9–3.3)
MCH: 32 pg (ref 28.0–33.4)
MCHC: 34.3 g/dL (ref 32.0–35.9)
MCV: 93.2 fL (ref 82.0–98.0)
Monocytes Absolute: 0.6 10*3/uL (ref 0.1–0.9)
Monocytes Relative: 11 %
NEUTROS ABS: 3.8 10*3/uL (ref 1.5–6.5)
NEUTROS PCT: 75 %
Platelet Count: 160 10*3/uL (ref 145–400)
RBC: 3.22 MIL/uL — AB (ref 4.20–5.70)
RDW: 13.6 % (ref 11.1–15.7)
WBC: 5.1 10*3/uL (ref 4.0–10.0)

## 2018-04-30 LAB — CMP (CANCER CENTER ONLY)
ALBUMIN: 3.4 g/dL — AB (ref 3.5–5.0)
ALT: 19 U/L (ref 10–47)
ANION GAP: 7 (ref 5–15)
AST: 28 U/L (ref 11–38)
Alkaline Phosphatase: 71 U/L (ref 26–84)
BUN: 27 mg/dL — ABNORMAL HIGH (ref 7–22)
CO2: 25 mmol/L (ref 18–33)
Calcium: 8.8 mg/dL (ref 8.0–10.3)
Chloride: 112 mmol/L — ABNORMAL HIGH (ref 98–108)
Creatinine: 1.6 mg/dL — ABNORMAL HIGH (ref 0.60–1.20)
Glucose, Bld: 104 mg/dL (ref 73–118)
POTASSIUM: 4.6 mmol/L (ref 3.3–4.7)
Sodium: 144 mmol/L (ref 128–145)
Total Bilirubin: 1 mg/dL (ref 0.2–1.6)
Total Protein: 6.9 g/dL (ref 6.4–8.1)

## 2018-04-30 MED ORDER — SODIUM CHLORIDE 0.9 % IV SOLN
300.0000 mg/m2 | Freq: Once | INTRAVENOUS | Status: AC
  Administered 2018-04-30: 540 mg via INTRAVENOUS
  Filled 2018-04-30: qty 27

## 2018-04-30 MED ORDER — DEXTROSE 5 % IV SOLN
60.0000 mg | Freq: Once | INTRAVENOUS | Status: AC
  Administered 2018-04-30: 60 mg via INTRAVENOUS
  Filled 2018-04-30: qty 30

## 2018-04-30 MED ORDER — PALONOSETRON HCL INJECTION 0.25 MG/5ML
0.2500 mg | Freq: Once | INTRAVENOUS | Status: AC
  Administered 2018-04-30: 0.25 mg via INTRAVENOUS

## 2018-04-30 MED ORDER — PALONOSETRON HCL INJECTION 0.25 MG/5ML
INTRAVENOUS | Status: AC
  Filled 2018-04-30: qty 5

## 2018-04-30 MED ORDER — DEXAMETHASONE SODIUM PHOSPHATE 10 MG/ML IJ SOLN
10.0000 mg | Freq: Once | INTRAMUSCULAR | Status: AC
  Administered 2018-04-30: 10 mg via INTRAVENOUS

## 2018-04-30 MED ORDER — DEXAMETHASONE SODIUM PHOSPHATE 10 MG/ML IJ SOLN
INTRAMUSCULAR | Status: AC
  Filled 2018-04-30: qty 1

## 2018-04-30 MED ORDER — SODIUM CHLORIDE 0.9 % IV SOLN
Freq: Once | INTRAVENOUS | Status: AC
  Administered 2018-04-30: 15:00:00 via INTRAVENOUS
  Filled 2018-04-30: qty 250

## 2018-04-30 NOTE — Progress Notes (Signed)
Hematology and Oncology Follow Up Visit  Christian Burns [id number] 1930-07-18 84 y.o. 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  Thankfully, it looks like he is not going to need any surgery for his jaw.  He was about to have some surgery but it was held off.  The oral surgeon felt that he was improving with antibiotics alone.  He is seen infectious disease.  Infectious disease has him on penicillin.  He will take penicillin for about 3 months.  He is doing well.  Today is his birthday.  He had his birthday dinner yesterday at his assisted living home.  He is feeling well.  He has had no cough.  He has had no nausea or vomiting.  He has had some neuropathy in the feet but this is chronic.  His myeloma levels have held incredibly steady.  Only last saw him, had the M spike was 0.4 g/dL.  His IgG level was 765 mg/dL.  His lambda light chain was 6.7 mg/dL.  Currently, his performance status is ECOG 2.  Medications:  Allergies as of 04/30/2018      Reactions   Sulfa Antibiotics Itching   Sulfasalazine Itching      Medication List        Accurate as of 04/30/18  2:50 PM. Always use your most recent med list.          amLODipine-benazepril 5-10 MG capsule Commonly known as:  LOTREL TAKE 1 CAPSULE BY MOUTH DAILY   chlorhexidine 0.12 % solution Commonly known as:  PERIDEX SWISH WITH 1/2 OUNCE FOR 30  SECONDS THEN SPIT BID   MULTIVITAL PO Take 1 tablet by mouth every morning.   penicillin v potassium 500 MG tablet Commonly known as:  VEETID Take 1 tablet (500 mg total) by mouth QID.   pyridOXINE 100 MG tablet Commonly known as:  VITAMIN B-6 Take 100 mg by mouth daily.   VITAMIN B 12 PO Take 1,000 mcg by mouth every morning.   Vitamin D (Ergocalciferol) 50000 units Caps capsule Commonly known as:  DRISDOL Take 50,000 Units by mouth every 7 (seven) days. Sundays       Allergies:  Allergies  Allergen Reactions  . Sulfa Antibiotics Itching  . Sulfasalazine Itching    Past Medical History, Surgical history, Social history, and Family History were reviewed and updated.  Review of Systems: Review of Systems  Constitutional: Negative.   HENT: Negative.   Eyes: Negative.   Respiratory: Negative.   Cardiovascular: Negative.   Gastrointestinal: Negative.   Genitourinary: Negative.   Musculoskeletal: Negative.   Skin: Negative.   Neurological: Positive for sensory change.  Endo/Heme/Allergies: Negative.   Psychiatric/Behavioral: Negative.      Physical Exam:  weight is 152 lb (68.9 kg). His oral temperature is 97.9 F (36.6 C). His blood pressure is 139/78 and his pulse is 69.  His respiration is 18 and oxygen saturation is 100%.   Wt Readings from Last 3 Encounters:  04/30/18 152 lb (68.9 kg)  04/28/18 150 lb 4.8 oz (68.2 kg)  04/09/18 152 lb (68.9 kg)    Physical Exam  Constitutional: He is oriented to person, place, and time.  HENT:  Head: Normocephalic and atraumatic.  Mouth/Throat: Oropharynx is clear and moist.  Eyes: Pupils are equal, round, and reactive to light. EOM are normal.  Neck: Normal range of motion.  Cardiovascular: Normal rate, regular rhythm and normal heart sounds.  Pulmonary/Chest: Effort normal and breath sounds normal.  Abdominal: Soft. Bowel sounds are normal.  Musculoskeletal: Normal range of motion. He exhibits no edema, tenderness  or deformity.  Lymphadenopathy:    He has no cervical adenopathy.  Neurological: He is alert and oriented to person, place, and time.  Skin: Skin is warm and dry. No rash noted. No erythema.  Psychiatric: He has a normal mood and affect. His behavior is normal. Judgment and thought content normal.  Vitals reviewed.     Lab Results  Component Value Date   WBC 5.1 04/30/2018   HGB 10.3 (L) 04/30/2018   HCT 30.0 (L) 04/30/2018   MCV 93.2 04/30/2018   PLT 160 04/30/2018   Lab Results  Component Value Date   FERRITIN 118 03/26/2018   IRON 87 03/26/2018   TIBC 245 03/26/2018   UIBC 158 03/26/2018   IRONPCTSAT 36 (L) 03/26/2018   Lab Results  Component Value Date   RBC 3.22 (L) 04/30/2018   Lab Results  Component Value Date   KPAFRELGTCHN 18.3 04/09/2018   LAMBDASER 67.3 (H) 04/09/2018   KAPLAMBRATIO 0.27 04/09/2018   Lab Results  Component Value Date   IGGSERUM 772 04/09/2018   IGGSERUM 765 04/09/2018   IGA 57 (L) 04/09/2018   IGA 57 (L) 04/09/2018   IGMSERUM 18 04/09/2018   IGMSERUM 17 04/09/2018   Lab Results  Component Value Date   TOTALPROTELP 5.4 (L) 04/09/2018   ALBUMINELP 3.1 04/09/2018   A1GS 0.2 04/09/2018   A2GS 0.7 04/09/2018   BETS 0.7 04/09/2018   BETA2SER 0.3 07/28/2015   GAMS 0.7 04/09/2018   MSPIKE 0.4 (H) 04/09/2018   SPEI Comment 03/05/2018     Chemistry      Component Value Date/Time   NA 144 04/30/2018 1315   NA 146 (H) 08/07/2017 1153   NA 141 01/09/2017 1004   K 4.6 04/30/2018 1315   K 4.6 08/07/2017 1153   K 4.4 01/09/2017 1004   CL 112 (H) 04/30/2018 1315   CL 108 08/07/2017 1153   CO2 25 04/30/2018 1315   CO2 26 08/07/2017 1153   CO2 22 01/09/2017 1004   BUN 27 (H) 04/30/2018 1315   BUN 24 (H) 08/07/2017 1153   BUN 23.9 01/09/2017 1004   CREATININE 1.60 (H) 04/30/2018 1315   CREATININE 1.5 (H) 08/07/2017 1153   CREATININE 1.5 (H) 01/09/2017 1004      Component Value Date/Time   CALCIUM 8.8 04/30/2018 1315   CALCIUM 8.8  08/07/2017 1153   CALCIUM 8.8 01/09/2017 1004   ALKPHOS 71 04/30/2018 1315   ALKPHOS 97 (H) 08/07/2017 1153   ALKPHOS 80 01/09/2017 1004   AST 28 04/30/2018 1315   AST 18 01/09/2017 1004   ALT 19 04/30/2018 1315   ALT 20 08/07/2017 1153   ALT 12 01/09/2017 1004   BILITOT 1.0 04/30/2018 1315   BILITOT 0.92 01/09/2017 1004       Impression  and Plan: Christian Burns is a very pleasant 84 yo caucasian gentleman with relapsed IgG lambda myeloma.  I am glad that he is holding off on surgery.  It really sounds like the penicillin will do the job.  It sounds like this is being treated as osteomyelitis.  We will now get him back onto treatment.  Again he is holding very steady.  We will plan to get him back to see [place] in another month.    [name], MD 9/19/20192:50 PM

## 2018-04-30 NOTE — Patient Instructions (Addendum)
Palonosetron Injection What is this medicine? PALONOSETRON (pal oh NOE se tron) is used to prevent nausea and vomiting caused by chemotherapy. It also helps prevent delayed nausea and vomiting that may occur a few days after your treatment. This medicine may be used for other purposes; ask your health care provider or pharmacist if you have questions. COMMON BRAND NAME(S): Aloxi What should I tell my health care provider before I take this medicine? They need to know if you have any of these conditions: -an unusual or allergic reaction to palonosetron, dolasetron, granisetron, ondansetron, other medicines, foods, dyes, or preservatives -pregnant or trying to get pregnant -breast-feeding How should I use this medicine? This medicine is for infusion into a vein. It is given by a health care professional in a hospital or clinic setting. Talk to your pediatrician regarding the use of this medicine in children. While this drug may be prescribed for children as young as 1 month for selected conditions, precautions do apply. Overdosage: If you think you have taken too much of this medicine contact a poison control center or emergency room at once. NOTE: This medicine is only for you. Do not share this medicine with others. What if I miss a dose? This does not apply. What may interact with this medicine? -certain medicines for depression, anxiety, or psychotic disturbances -fentanyl -linezolid -MAOIs like Carbex, Eldepryl, Marplan, Nardil, and Parnate -methylene blue (injected into a vein) -tramadol This list may not describe all possible interactions. Give your health care provider a list of all the medicines, herbs, non-prescription drugs, or dietary supplements you use. Also tell them if you smoke, drink alcohol, or use illegal drugs. Some items may interact with your medicine. What should I watch for while using this medicine? Your condition will be monitored carefully while you are receiving  this medicine. What side effects may I notice from receiving this medicine? Side effects that you should report to your doctor or health care professional as soon as possible: -allergic reactions like skin rash, itching or hives, swelling of the face, lips, or tongue -breathing problems -confusion -dizziness -fast, irregular heartbeat -fever and chills -loss of balance or coordination -seizures -sweating -swelling of the hands and feet -tremors -unusually weak or tired Side effects that usually do not require medical attention (report to your doctor or health care professional if they continue or are bothersome): -constipation or diarrhea -headache This list may not describe all possible side effects. Call your doctor for medical advice about side effects. You may report side effects to FDA at 1-800-FDA-1088. Where should I keep my medicine? This drug is given in a hospital or clinic and will not be stored at home. NOTE: This sheet is a summary. It may not cover all possible information. If you have questions about this medicine, talk to your doctor, pharmacist, or health care provider.  2018 Elsevier/Gold Standard (2013-06-04 10:38:36) Dexamethasone injection What is this medicine? DEXAMETHASONE (dex a METH a sone) is a corticosteroid. It is used to treat inflammation of the skin, joints, lungs, and other organs. Common conditions treated include asthma, allergies, and arthritis. It is also used for other conditions, like blood disorders and diseases of the adrenal glands. This medicine may be used for other purposes; ask your health care provider or pharmacist if you have questions. COMMON BRAND NAME(S): Decadron, DoubleDex, Simplist Dexamethasone, Solurex What should I tell my health care provider before I take this medicine? They need to know if you have any of these conditions: -blood clotting  problems -Cushing's syndrome -diabetes -glaucoma -heart problems or disease -high  blood pressure -infection like herpes, measles, tuberculosis, or chickenpox -kidney disease -liver disease -mental problems -myasthenia gravis -osteoporosis -previous heart attack -seizures -stomach, ulcer or intestine disease including colitis and diverticulitis -thyroid problem -an unusual or allergic reaction to dexamethasone, corticosteroids, other medicines, lactose, foods, dyes, or preservatives -pregnant or trying to get pregnant -breast-feeding How should I use this medicine? This medicine is for injection into a muscle, joint, lesion, soft tissue, or vein. It is given by a health care professional in a hospital or clinic setting. Talk to your pediatrician regarding the use of this medicine in children. Special care may be needed. Overdosage: If you think you have taken too much of this medicine contact a poison control center or emergency room at once. NOTE: This medicine is only for you. Do not share this medicine with others. What if I miss a dose? This may not apply. If you are having a series of injections over a prolonged period, try not to miss an appointment. Call your doctor or health care professional to reschedule if you are unable to keep an appointment. What may interact with this medicine? Do not take this medicine with any of the following medications: -mifepristone, RU-486 -vaccines This medicine may also interact with the following medications: -amphotericin B -antibiotics like clarithromycin, erythromycin, and troleandomycin -aspirin and aspirin-like drugs -barbiturates like phenobarbital -carbamazepine -cholestyramine -cholinesterase inhibitors like donepezil, galantamine, rivastigmine, and tacrine -cyclosporine -digoxin -diuretics -ephedrine -male hormones, like estrogens or progestins and birth control pills -indinavir -isoniazid -ketoconazole -medicines for diabetes -medicines that improve muscle tone or strength for conditions like myasthenia  gravis -NSAIDs, medicines for pain and inflammation, like ibuprofen or naproxen -phenytoin -rifampin -thalidomide -warfarin This list may not describe all possible interactions. Give your health care provider a list of all the medicines, herbs, non-prescription drugs, or dietary supplements you use. Also tell them if you smoke, drink alcohol, or use illegal drugs. Some items may interact with your medicine. What should I watch for while using this medicine? Your condition will be monitored carefully while you are receiving this medicine. If you are taking this medicine for a long time, carry an identification card with your name and address, the type and dose of your medicine, and your doctor's name and address. This medicine may increase your risk of getting an infection. Stay away from people who are sick. Tell your doctor or health care professional if you are around anyone with measles or chickenpox. Talk to your health care provider before you get any vaccines that you take this medicine. If you are going to have surgery, tell your doctor or health care professional that you have taken this medicine within the last twelve months. Ask your doctor or health care professional about your diet. You may need to lower the amount of salt you eat. The medicine can increase your blood sugar. If you are a diabetic check with your doctor if you need help adjusting the dose of your diabetic medicine. What side effects may I notice from receiving this medicine? Side effects that you should report to your doctor or health care professional as soon as possible: -allergic reactions like skin rash, itching or hives, swelling of the face, lips, or tongue -black or tarry stools -change in the amount of urine -changes in vision -confusion, excitement, restlessness, a false sense of well-being -fever, sore throat, sneezing, cough, or other signs of infection, wounds that will not  heal -hallucinations -  increased thirst -mental depression, mood swings, mistaken feelings of self importance or of being mistreated -pain in hips, back, ribs, arms, shoulders, or legs -pain, redness, or irritation at the injection site -redness, blistering, peeling or loosening of the skin, including inside the mouth -rounding out of face -swelling of feet or lower legs -unusual bleeding or bruising -unusual tired or weak -wounds that do not heal Side effects that usually do not require medical attention (report to your doctor or health care professional if they continue or are bothersome): -diarrhea or constipation -change in taste -headache -nausea, vomiting -skin problems, acne, thin and shiny skin -touble sleeping -unusual growth of hair on the face or body -weight gain This list may not describe all possible side effects. Call your doctor for medical advice about side effects. You may report side effects to FDA at 1-800-FDA-1088. Where should I keep my medicine? This drug is given in a hospital or clinic and will not be stored at home. NOTE: This sheet is a summary. It may not cover all possible information. If you have questions about this medicine, talk to your doctor, pharmacist, or health care provider.  2018 Elsevier/Gold Standard (2007-11-19 14:04:12) Cyclophosphamide injection What is this medicine? CYCLOPHOSPHAMIDE (sye kloe FOSS fa mide) is a chemotherapy drug. It slows the growth of cancer cells. This medicine is used to treat many types of cancer like lymphoma, myeloma, leukemia, breast cancer, and ovarian cancer, to name a few. This medicine may be used for other purposes; ask your health care provider or pharmacist if you have questions. COMMON BRAND NAME(S): Cytoxan, Neosar What should I tell my health care provider before I take this medicine? They need to know if you have any of these conditions: -blood disorders -history of other  chemotherapy -infection -kidney disease -liver disease -recent or ongoing radiation therapy -tumors in the bone marrow -an unusual or allergic reaction to cyclophosphamide, other chemotherapy, other medicines, foods, dyes, or preservatives -pregnant or trying to get pregnant -breast-feeding How should I use this medicine? This drug is usually given as an injection into a vein or muscle or by infusion into a vein. It is administered in a hospital or clinic by a specially trained health care professional. Talk to your pediatrician regarding the use of this medicine in children. Special care may be needed. Overdosage: If you think you have taken too much of this medicine contact a poison control center or emergency room at once. NOTE: This medicine is only for you. Do not share this medicine with others. What if I miss a dose? It is important not to miss your dose. Call your doctor or health care professional if you are unable to keep an appointment. What may interact with this medicine? This medicine may interact with the following medications: -amiodarone -amphotericin B -azathioprine -certain antiviral medicines for HIV or AIDS such as protease inhibitors (e.g., indinavir, ritonavir) and zidovudine -certain blood pressure medications such as benazepril, captopril, enalapril, fosinopril, lisinopril, moexipril, monopril, perindopril, quinapril, ramipril, trandolapril -certain cancer medications such as anthracyclines (e.g., daunorubicin, doxorubicin), busulfan, cytarabine, paclitaxel, pentostatin, tamoxifen, trastuzumab -certain diuretics such as chlorothiazide, chlorthalidone, hydrochlorothiazide, indapamide, metolazone -certain medicines that treat or prevent blood clots like warfarin -certain muscle relaxants such as succinylcholine -cyclosporine -etanercept -indomethacin -medicines to increase blood counts like filgrastim, pegfilgrastim, sargramostim -medicines used as general  anesthesia -metronidazole -natalizumab This list may not describe all possible interactions. Give your health care provider a list of all the medicines, herbs, non-prescription drugs, or dietary supplements you  use. Also tell them if you smoke, drink alcohol, or use illegal drugs. Some items may interact with your medicine. What should I watch for while using this medicine? Visit your doctor for checks on your progress. This drug may make you feel generally unwell. This is not uncommon, as chemotherapy can affect healthy cells as well as cancer cells. Report any side effects. Continue your course of treatment even though you feel ill unless your doctor tells you to stop. Drink water or other fluids as directed. Urinate often, even at night. In some cases, you may be given additional medicines to help with side effects. Follow all directions for their use. Call your doctor or health care professional for advice if you get a fever, chills or sore throat, or other symptoms of a cold or flu. Do not treat yourself. This drug decreases your body's ability to fight infections. Try to avoid being around people who are sick. This medicine may increase your risk to bruise or bleed. Call your doctor or health care professional if you notice any unusual bleeding. Be careful brushing and flossing your teeth or using a toothpick because you may get an infection or bleed more easily. If you have any dental work done, tell your dentist you are receiving this medicine. You may get drowsy or dizzy. Do not drive, use machinery, or do anything that needs mental alertness until you know how this medicine affects you. Do not become pregnant while taking this medicine or for 1 year after stopping it. Women should inform their doctor if they wish to become pregnant or think they might be pregnant. Men should not father a child while taking this medicine and for 4 months after stopping it. There is a potential for serious side  effects to an unborn child. Talk to your health care professional or pharmacist for more information. Do not breast-feed an infant while taking this medicine. This medicine may interfere with the ability to have a child. This medicine has caused ovarian failure in some women. This medicine has caused reduced sperm counts in some men. You should talk with your doctor or health care professional if you are concerned about your fertility. If you are going to have surgery, tell your doctor or health care professional that you have taken this medicine. What side effects may I notice from receiving this medicine? Side effects that you should report to your doctor or health care professional as soon as possible: -allergic reactions like skin rash, itching or hives, swelling of the face, lips, or tongue -low blood counts - this medicine may decrease the number of white blood cells, red blood cells and platelets. You may be at increased risk for infections and bleeding. -signs of infection - fever or chills, cough, sore throat, pain or difficulty passing urine -signs of decreased platelets or bleeding - bruising, pinpoint red spots on the skin, black, tarry stools, blood in the urine -signs of decreased red blood cells - unusually weak or tired, fainting spells, lightheadedness -breathing problems -dark urine -dizziness -palpitations -swelling of the ankles, feet, hands -trouble passing urine or change in the amount of urine -weight gain -yellowing of the eyes or skin Side effects that usually do not require medical attention (report to your doctor or health care professional if they continue or are bothersome): -changes in nail or skin color -hair loss -missed menstrual periods -mouth sores -nausea, vomiting This list may not describe all possible side effects. Call your doctor for medical advice about  side effects. You may report side effects to FDA at 1-800-FDA-1088. Where should I keep my  medicine? This drug is given in a hospital or clinic and will not be stored at home. NOTE: This sheet is a summary. It may not cover all possible information. If you have questions about this medicine, talk to your doctor, pharmacist, or health care provider.  2018 Elsevier/Gold Standard (2012-06-12 16:22:58) Carfilzomib injection What is this medicine? CARFILZOMIB (kar FILZ oh mib) targets a specific protein within cancer cells and stops the cancer cells from growing. It is used to treat multiple myeloma. This medicine may be used for other purposes; ask your health care provider or pharmacist if you have questions. COMMON BRAND NAME(S): KYPROLIS What should I tell my health care provider before I take this medicine? They need to know if you have any of these conditions: -heart disease -history of blood clots -irregular heartbeat -kidney disease -liver disease -lung or breathing disease -an unusual or allergic reaction to carfilzomib, or other medicines, foods, dyes, or preservatives -pregnant or trying to get pregnant -breast-feeding How should I use this medicine? This medicine is for injection or infusion into a vein. It is given by a health care professional in a hospital or clinic setting. Talk to your pediatrician regarding the use of this medicine in children. Special care may be needed. Overdosage: If you think you have taken too much of this medicine contact a poison control center or emergency room at once. NOTE: This medicine is only for you. Do not share this medicine with others. What if I miss a dose? It is important not to miss your dose. Call your doctor or health care professional if you are unable to keep an appointment. What may interact with this medicine? Interactions are not expected. Give your health care provider a list of all the medicines, herbs, non-prescription drugs, or dietary supplements you use. Also tell them if you smoke, drink alcohol, or use illegal  drugs. Some items may interact with your medicine. This list may not describe all possible interactions. Give your health care provider a list of all the medicines, herbs, non-prescription drugs, or dietary supplements you use. Also tell them if you smoke, drink alcohol, or use illegal drugs. Some items may interact with your medicine. What should I watch for while using this medicine? Your condition will be monitored carefully while you are receiving this medicine. Report any side effects. Continue your course of treatment even though you feel ill unless your doctor tells you to stop. You may need blood work done while you are taking this medicine. Do not become pregnant while taking this medicine or for at least 30 days after stopping it. Women should inform their doctor if they wish to become pregnant or think they might be pregnant. There is a potential for serious side effects to an unborn child. Men should not father a child while taking this medicine and for 90 days after stopping it. Talk to your health care professional or pharmacist for more information. Do not breast-feed an infant while taking this medicine. Check with your doctor or health care professional if you get an attack of severe diarrhea, nausea and vomiting, or if you sweat a lot. The loss of too much body fluid can make it dangerous for you to take this medicine. You may get dizzy. Do not drive, use machinery, or do anything that needs mental alertness until you know how this medicine affects you. Do not stand or sit  up quickly, especially if you are an older patient. This reduces the risk of dizzy or fainting spells. What side effects may I notice from receiving this medicine? Side effects that you should report to your doctor or health care professional as soon as possible: -allergic reactions like skin rash, itching or hives, swelling of the face, lips, or tongue -confusion -dizziness -feeling faint or lightheaded -fever or  chills -palpitations -seizures -signs and symptoms of bleeding such as bloody or black, tarry stools; red or dark-brown urine; spitting up blood or brown material that looks like coffee grounds; red spots on the skin; unusual bruising or bleeding including from the eye, gums, or nose -signs and symptoms of a blood clot such as breathing problems; changes in vision; chest pain; severe, sudden headache; pain, swelling, warmth in the leg; trouble speaking; sudden numbness or weakness of the face, arm or leg -signs and symptoms of kidney injury like trouble passing urine or change in the amount of urine -signs and symptoms of liver injury like dark yellow or brown urine; general ill feeling or flu-like symptoms; light-colored stools; loss of appetite; nausea; right upper belly pain; unusually weak or tired; yellowing of the eyes or skin Side effects that usually do not require medical attention (report to your doctor or health care professional if they continue or are bothersome): -back pain -cough -diarrhea -headache -muscle cramps -vomiting This list may not describe all possible side effects. Call your doctor for medical advice about side effects. You may report side effects to FDA at 1-800-FDA-1088. Where should I keep my medicine? This drug is given in a hospital or clinic and will not be stored at home. NOTE: This sheet is a summary. It may not cover all possible information. If you have questions about this medicine, talk to your doctor, pharmacist, or health care provider.  2018 Elsevier/Gold Standard (2015-08-31 13:39:23)

## 2018-05-01 LAB — KAPPA/LAMBDA LIGHT CHAINS
KAPPA, LAMDA LIGHT CHAIN RATIO: 0.22 — AB (ref 0.26–1.65)
Kappa free light chain: 20.6 mg/L — ABNORMAL HIGH (ref 3.3–19.4)
LAMDA FREE LIGHT CHAINS: 93.5 mg/L — AB (ref 5.7–26.3)

## 2018-05-01 LAB — IGG, IGA, IGM
IGA: 67 mg/dL (ref 61–437)
IgG (Immunoglobin G), Serum: 856 mg/dL (ref 700–1600)
IgM (Immunoglobulin M), Srm: 18 mg/dL (ref 15–143)

## 2018-05-04 LAB — PROTEIN ELECTROPHORESIS, SERUM, WITH REFLEX
A/G Ratio: 1.3 (ref 0.7–1.7)
ALPHA-1-GLOBULIN: 0.2 g/dL (ref 0.0–0.4)
Albumin ELP: 3.5 g/dL (ref 2.9–4.4)
Alpha-2-Globulin: 0.7 g/dL (ref 0.4–1.0)
BETA GLOBULIN: 0.8 g/dL (ref 0.7–1.3)
Gamma Globulin: 0.8 g/dL (ref 0.4–1.8)
Globulin, Total: 2.6 g/dL (ref 2.2–3.9)
M-SPIKE, %: 0.4 g/dL — AB
SPEP Interpretation: 0
Total Protein ELP: 6.1 g/dL (ref 6.0–8.5)

## 2018-05-04 LAB — IMMUNOFIXATION REFLEX, SERUM
IGA: 69 mg/dL (ref 61–437)
IGG (IMMUNOGLOBIN G), SERUM: 900 mg/dL (ref 700–1600)
IGM (IMMUNOGLOBULIN M), SRM: 20 mg/dL (ref 15–143)

## 2018-05-07 ENCOUNTER — Ambulatory Visit: Payer: Medicare HMO

## 2018-05-07 ENCOUNTER — Ambulatory Visit: Payer: Medicare HMO | Admitting: Hematology & Oncology

## 2018-05-07 ENCOUNTER — Other Ambulatory Visit: Payer: Medicare HMO

## 2018-05-11 DIAGNOSIS — D225 Melanocytic nevi of trunk: Secondary | ICD-10-CM | POA: Diagnosis not present

## 2018-05-11 DIAGNOSIS — D1800 Hemangioma unspecified site: Secondary | ICD-10-CM | POA: Diagnosis not present

## 2018-05-11 DIAGNOSIS — Z85828 Personal history of other malignant neoplasm of skin: Secondary | ICD-10-CM | POA: Diagnosis not present

## 2018-05-11 DIAGNOSIS — L814 Other melanin hyperpigmentation: Secondary | ICD-10-CM | POA: Diagnosis not present

## 2018-05-11 DIAGNOSIS — L570 Actinic keratosis: Secondary | ICD-10-CM | POA: Diagnosis not present

## 2018-05-11 DIAGNOSIS — L821 Other seborrheic keratosis: Secondary | ICD-10-CM | POA: Diagnosis not present

## 2018-05-11 DIAGNOSIS — C4441 Basal cell carcinoma of skin of scalp and neck: Secondary | ICD-10-CM | POA: Diagnosis not present

## 2018-05-11 DIAGNOSIS — D485 Neoplasm of uncertain behavior of skin: Secondary | ICD-10-CM | POA: Diagnosis not present

## 2018-05-16 ENCOUNTER — Other Ambulatory Visit: Payer: Self-pay | Admitting: Hematology & Oncology

## 2018-05-16 DIAGNOSIS — I10 Essential (primary) hypertension: Secondary | ICD-10-CM

## 2018-05-20 ENCOUNTER — Other Ambulatory Visit: Payer: Self-pay

## 2018-05-20 DIAGNOSIS — C9001 Multiple myeloma in remission: Secondary | ICD-10-CM

## 2018-05-21 ENCOUNTER — Other Ambulatory Visit: Payer: Self-pay

## 2018-05-21 ENCOUNTER — Inpatient Hospital Stay: Payer: Medicare HMO

## 2018-05-21 ENCOUNTER — Ambulatory Visit: Payer: Medicare HMO | Admitting: Hematology & Oncology

## 2018-05-21 ENCOUNTER — Inpatient Hospital Stay: Payer: Medicare HMO | Attending: Hematology & Oncology

## 2018-05-21 DIAGNOSIS — D6481 Anemia due to antineoplastic chemotherapy: Secondary | ICD-10-CM | POA: Diagnosis not present

## 2018-05-21 DIAGNOSIS — C9001 Multiple myeloma in remission: Secondary | ICD-10-CM

## 2018-05-21 DIAGNOSIS — Z5112 Encounter for antineoplastic immunotherapy: Secondary | ICD-10-CM | POA: Diagnosis not present

## 2018-05-21 DIAGNOSIS — N289 Disorder of kidney and ureter, unspecified: Secondary | ICD-10-CM | POA: Insufficient documentation

## 2018-05-21 DIAGNOSIS — C9002 Multiple myeloma in relapse: Secondary | ICD-10-CM | POA: Insufficient documentation

## 2018-05-21 DIAGNOSIS — Z79899 Other long term (current) drug therapy: Secondary | ICD-10-CM | POA: Insufficient documentation

## 2018-05-21 DIAGNOSIS — Z5111 Encounter for antineoplastic chemotherapy: Secondary | ICD-10-CM | POA: Diagnosis present

## 2018-05-21 DIAGNOSIS — G620 Drug-induced polyneuropathy: Secondary | ICD-10-CM | POA: Diagnosis not present

## 2018-05-21 LAB — CBC WITH DIFFERENTIAL (CANCER CENTER ONLY)
Abs Immature Granulocytes: 0.02 10*3/uL (ref 0.00–0.07)
Basophils Absolute: 0.1 10*3/uL (ref 0.0–0.1)
Basophils Relative: 2 %
EOS ABS: 0.2 10*3/uL (ref 0.0–0.5)
EOS PCT: 6 %
HEMATOCRIT: 29.9 % — AB (ref 39.0–52.0)
HEMOGLOBIN: 9.8 g/dL — AB (ref 13.0–17.0)
Immature Granulocytes: 1 %
LYMPHS ABS: 0.4 10*3/uL — AB (ref 0.7–4.0)
LYMPHS PCT: 10 %
MCH: 30.8 pg (ref 26.0–34.0)
MCHC: 32.8 g/dL (ref 30.0–36.0)
MCV: 94 fL (ref 80.0–100.0)
MONO ABS: 0.7 10*3/uL (ref 0.1–1.0)
Monocytes Relative: 18 %
NRBC: 0 % (ref 0.0–0.2)
Neutro Abs: 2.5 10*3/uL (ref 1.7–7.7)
Neutrophils Relative %: 63 %
Platelet Count: 155 10*3/uL (ref 150–400)
RBC: 3.18 MIL/uL — AB (ref 4.22–5.81)
RDW: 13.6 % (ref 11.5–15.5)
WBC: 3.9 10*3/uL — AB (ref 4.0–10.5)

## 2018-05-21 LAB — CMP (CANCER CENTER ONLY)
ALK PHOS: 68 U/L (ref 26–84)
ALT: 17 U/L (ref 10–47)
AST: 28 U/L (ref 11–38)
Albumin: 3.4 g/dL — ABNORMAL LOW (ref 3.5–5.0)
Anion gap: 0 — ABNORMAL LOW (ref 5–15)
BILIRUBIN TOTAL: 1.1 mg/dL (ref 0.2–1.6)
BUN: 29 mg/dL — ABNORMAL HIGH (ref 7–22)
CHLORIDE: 115 mmol/L — AB (ref 98–108)
CO2: 24 mmol/L (ref 18–33)
Calcium: 9.3 mg/dL (ref 8.0–10.3)
Creatinine: 1.8 mg/dL — ABNORMAL HIGH (ref 0.60–1.20)
Glucose, Bld: 87 mg/dL (ref 73–118)
POTASSIUM: 5.5 mmol/L — AB (ref 3.3–4.7)
Sodium: 138 mmol/L (ref 128–145)
Total Protein: 6.5 g/dL (ref 6.4–8.1)

## 2018-05-21 NOTE — Progress Notes (Signed)
Patient having dermatological surgery next week. No treatment today per Dr. Marin Olp. Patient verbalized understanding.

## 2018-05-26 ENCOUNTER — Ambulatory Visit (INDEPENDENT_AMBULATORY_CARE_PROVIDER_SITE_OTHER): Payer: Medicare HMO | Admitting: Internal Medicine

## 2018-05-26 ENCOUNTER — Encounter: Payer: Self-pay | Admitting: Internal Medicine

## 2018-05-26 VITALS — BP 138/77 | HR 76 | Temp 97.9°F | Ht 72.0 in | Wt 156.0 lb

## 2018-05-26 DIAGNOSIS — M272 Inflammatory conditions of jaws: Secondary | ICD-10-CM | POA: Diagnosis not present

## 2018-05-26 MED ORDER — PENICILLIN V POTASSIUM 500 MG PO TABS: 500.0000 mg | ORAL_TABLET | Freq: Four times a day (QID) | ORAL | 5 refills | Status: DC

## 2018-05-26 NOTE — Progress Notes (Signed)
  Patient ID: Christian Burns, male   DOB: 11/26/1929, 84 y.o.   MRN: [id number]  HPI Christian Burns is an 84 yo M with recurrent IgG lambda myeloma followed by Christian Burns. He was referred by Christian Burns for follow up on jaw osteomyelitis. He states that he currently has some Molar discomfort. Christian Burns feels like this is due to gum disease  The jaw osteo is thought to have affected his front teeth lower which have been subsequently extracted. He feels that the Healing process too slow and questions to whether there is bone exposure on medial aspect of one of healing areas. This thought to be due to osteonecrosis and now has osteomyelitis with associated fistula submental  drainaged stopped before starting pencillin now on it for 8 wks, we last saw him roughly 4 wks ago.   Healed area from front bottom 2 teeth extraction  Recurrent IgG lambda myeloma - progressive Hypercalcemia of malignancy Anemia of renal insufficiency and chemotherapy  Outpatient Encounter Medications as of 05/26/2018  Medication Sig  . amLODipine-benazepril (LOTREL) 5-10 MG capsule TAKE 1 CAPSULE BY MOUTH DAILY  . chlorhexidine (PERIDEX) 0.12 % solution SWISH WITH 1/2 OUNCE FOR 30 SECONDS THEN SPIT BID  . Cyanocobalamin (VITAMIN B 12 PO) Take 1,000 mcg by mouth every morning.   . Multiple Vitamins-Minerals (MULTIVITAL PO) Take 1 tablet by mouth every morning.   . penicillin v potassium (VEETID) 500 MG tablet Take 1 tablet (500 mg total) by mouth QID.  . pyridOXINE (VITAMIN B-6) 100 MG tablet Take 100 mg by mouth daily.   . Vitamin D, Ergocalciferol, (DRISDOL) 50000 UNITS CAPS capsule Take 50,000 Units by mouth every 7 (seven) days. Sundays   No facility-administered encounter medications on file as of 05/26/2018.      Patient Active Problem List   Diagnosis Date Noted  . Iron deficiency anemia secondary to inadequate dietary iron intake 07/18/2016  . Multiple myeloma in relapse (HCC) 04/05/2016  . Humoral hypercalcemia  of malignancy 10/02/2015  . Multiple myeloma in remission (HCC) 09/08/2015  . Myeloma (HCC) 08/23/2011     Health Maintenance Due  Topic Date Due  . TETANUS/TDAP  05/01/1951  . PNA vac Low Risk Adult (1 of 2 - PCV13) 04/30/1997     Review of Systems  Physical Exam   BP 138/77   Pulse 76   Temp 97.9 F (36.6 C)   Ht 6' (1.829 m)   Wt 156 lb (70.8 kg)   BMI 21.16 kg/m    No results found for: CD4TCELL No results found for: CD4TABS No results found for: HIV1RNAQUANT No results found for: HEPBSAB No results found for: RPR, LABRPR  CBC Lab Results  Component Value Date   WBC 3.9 (L) 05/21/2018   RBC 3.18 (L) 05/21/2018   HGB 9.8 (L) 05/21/2018   HCT 29.9 (L) 05/21/2018   PLT 155 05/21/2018   MCV 94.0 05/21/2018   MCH 30.8 05/21/2018   MCHC 32.8 05/21/2018   RDW 13.6 05/21/2018   LYMPHSABS 0.4 (L) 05/21/2018   MONOABS 0.7 05/21/2018   EOSABS 0.2 05/21/2018    BMET Lab Results  Component Value Date   NA 138 05/21/2018   K 5.5 (H) 05/21/2018   CL 115 (H) 05/21/2018   CO2 24 05/21/2018   GLUCOSE 87 05/21/2018   BUN 29 (H) 05/21/2018   CREATININE 1.80 (H) 05/21/2018   CALCIUM 9.3 05/21/2018   GFRNONAA 59 (L) 08/21/2017   GFRAA >60 08/21/2017        Assessment and Plan 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 Will also have upcoming imaging of jaw in 6 wk to see how it is progressing through Christian Burns office.  for now stay on orals but iif imaging suggests worsening, we can shift to iv therapy

## 2018-05-27 LAB — BASIC METABOLIC PANEL
BUN/Creatinine Ratio: 19 (calc) (ref 6–22)
BUN: 30 mg/dL — AB (ref 7–25)
CO2: 25 mmol/L (ref 20–32)
Calcium: 8.5 mg/dL — ABNORMAL LOW (ref 8.6–10.3)
Chloride: 109 mmol/L (ref 98–110)
Creat: 1.56 mg/dL — ABNORMAL HIGH (ref 0.70–1.11)
GLUCOSE: 86 mg/dL (ref 65–99)
Potassium: 5 mmol/L (ref 3.5–5.3)
Sodium: 139 mmol/L (ref 135–146)

## 2018-05-27 LAB — SEDIMENTATION RATE: Sed Rate: 31 mm/h — ABNORMAL HIGH (ref 0–20)

## 2018-05-27 LAB — C-REACTIVE PROTEIN: CRP: 1.9 mg/L (ref <8.0)

## 2018-06-04 DIAGNOSIS — C4441 Basal cell carcinoma of skin of scalp and neck: Secondary | ICD-10-CM | POA: Diagnosis not present

## 2018-06-11 ENCOUNTER — Other Ambulatory Visit: Payer: Self-pay

## 2018-06-11 ENCOUNTER — Inpatient Hospital Stay: Payer: Medicare HMO

## 2018-06-11 ENCOUNTER — Inpatient Hospital Stay (HOSPITAL_BASED_OUTPATIENT_CLINIC_OR_DEPARTMENT_OTHER): Payer: Medicare HMO | Admitting: Family

## 2018-06-11 VITALS — BP 154/79 | HR 80 | Temp 97.6°F | Resp 18 | Wt 156.0 lb

## 2018-06-11 DIAGNOSIS — Z5112 Encounter for antineoplastic immunotherapy: Secondary | ICD-10-CM | POA: Diagnosis not present

## 2018-06-11 DIAGNOSIS — C9001 Multiple myeloma in remission: Secondary | ICD-10-CM

## 2018-06-11 DIAGNOSIS — D508 Other iron deficiency anemias: Secondary | ICD-10-CM

## 2018-06-11 DIAGNOSIS — Z5111 Encounter for antineoplastic chemotherapy: Secondary | ICD-10-CM | POA: Diagnosis not present

## 2018-06-11 DIAGNOSIS — G620 Drug-induced polyneuropathy: Secondary | ICD-10-CM | POA: Diagnosis not present

## 2018-06-11 DIAGNOSIS — N289 Disorder of kidney and ureter, unspecified: Secondary | ICD-10-CM

## 2018-06-11 DIAGNOSIS — D6481 Anemia due to antineoplastic chemotherapy: Secondary | ICD-10-CM | POA: Diagnosis not present

## 2018-06-11 DIAGNOSIS — C9000 Multiple myeloma not having achieved remission: Secondary | ICD-10-CM

## 2018-06-11 DIAGNOSIS — C9002 Multiple myeloma in relapse: Secondary | ICD-10-CM | POA: Diagnosis not present

## 2018-06-11 DIAGNOSIS — Z79899 Other long term (current) drug therapy: Secondary | ICD-10-CM

## 2018-06-11 LAB — CBC WITH DIFFERENTIAL (CANCER CENTER ONLY)
ABS IMMATURE GRANULOCYTES: 0.06 10*3/uL (ref 0.00–0.07)
BASOS PCT: 1 %
Basophils Absolute: 0 10*3/uL (ref 0.0–0.1)
Eosinophils Absolute: 0.2 10*3/uL (ref 0.0–0.5)
Eosinophils Relative: 4 %
HCT: 29.9 % — ABNORMAL LOW (ref 39.0–52.0)
HEMOGLOBIN: 9.8 g/dL — AB (ref 13.0–17.0)
IMMATURE GRANULOCYTES: 1 %
LYMPHS ABS: 0.3 10*3/uL — AB (ref 0.7–4.0)
Lymphocytes Relative: 6 %
MCH: 30.7 pg (ref 26.0–34.0)
MCHC: 32.8 g/dL (ref 30.0–36.0)
MCV: 93.7 fL (ref 80.0–100.0)
MONO ABS: 0.7 10*3/uL (ref 0.1–1.0)
Monocytes Relative: 12 %
NEUTROS ABS: 4.2 10*3/uL (ref 1.7–7.7)
Neutrophils Relative %: 76 %
PLATELETS: 181 10*3/uL (ref 150–400)
RBC: 3.19 MIL/uL — ABNORMAL LOW (ref 4.22–5.81)
RDW: 13.3 % (ref 11.5–15.5)
WBC Count: 5.5 10*3/uL (ref 4.0–10.5)
nRBC: 0 % (ref 0.0–0.2)

## 2018-06-11 LAB — CMP (CANCER CENTER ONLY)
ALT: 18 U/L (ref 10–47)
AST: 28 U/L (ref 11–38)
Albumin: 3.4 g/dL — ABNORMAL LOW (ref 3.5–5.0)
Alkaline Phosphatase: 77 U/L (ref 26–84)
Anion gap: 7 (ref 5–15)
BUN: 28 mg/dL — AB (ref 7–22)
CO2: 24 mmol/L (ref 18–33)
Calcium: 9.3 mg/dL (ref 8.0–10.3)
Chloride: 110 mmol/L — ABNORMAL HIGH (ref 98–108)
Creatinine: 2 mg/dL — ABNORMAL HIGH (ref 0.60–1.20)
Glucose, Bld: 114 mg/dL (ref 73–118)
POTASSIUM: 5 mmol/L — AB (ref 3.3–4.7)
SODIUM: 141 mmol/L (ref 128–145)
TOTAL PROTEIN: 6.7 g/dL (ref 6.4–8.1)
Total Bilirubin: 0.9 mg/dL (ref 0.2–1.6)

## 2018-06-11 MED ORDER — PALONOSETRON HCL INJECTION 0.25 MG/5ML
INTRAVENOUS | Status: AC
  Filled 2018-06-11: qty 5

## 2018-06-11 MED ORDER — SODIUM CHLORIDE 0.9 % IV SOLN
Freq: Once | INTRAVENOUS | Status: AC
  Administered 2018-06-11: 15:00:00 via INTRAVENOUS
  Filled 2018-06-11: qty 250

## 2018-06-11 MED ORDER — DEXAMETHASONE SODIUM PHOSPHATE 10 MG/ML IJ SOLN
INTRAMUSCULAR | Status: AC
  Filled 2018-06-11: qty 1

## 2018-06-11 MED ORDER — PALONOSETRON HCL INJECTION 0.25 MG/5ML
0.2500 mg | Freq: Once | INTRAVENOUS | Status: AC
  Administered 2018-06-11: 0.25 mg via INTRAVENOUS

## 2018-06-11 MED ORDER — DEXAMETHASONE SODIUM PHOSPHATE 10 MG/ML IJ SOLN
10.0000 mg | Freq: Once | INTRAMUSCULAR | Status: AC
  Administered 2018-06-11: 10 mg via INTRAVENOUS

## 2018-06-11 MED ORDER — SODIUM CHLORIDE 0.9 % IV SOLN
300.0000 mg/m2 | Freq: Once | INTRAVENOUS | Status: AC
  Administered 2018-06-11: 540 mg via INTRAVENOUS
  Filled 2018-06-11: qty 27

## 2018-06-11 MED ORDER — SODIUM CHLORIDE 0.9 % IV SOLN
Freq: Once | INTRAVENOUS | Status: DC
  Filled 2018-06-11: qty 250

## 2018-06-11 MED ORDER — DEXTROSE 5 % IV SOLN
60.0000 mg | Freq: Once | INTRAVENOUS | Status: AC
  Administered 2018-06-11: 60 mg via INTRAVENOUS
  Filled 2018-06-11: qty 30

## 2018-06-11 NOTE — Progress Notes (Signed)
Okay to treat with SCr = 2 per Dr. Ennever. 

## 2018-06-11 NOTE — Progress Notes (Signed)
Hematology and Oncology Follow Up Visit  ARLIS YALE [id number] [birth year] 84 y.o. 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 Taddei is here today with his grandson for follow-up and treatment. He continues to do well and has recuperated nicely from his recent surgery with dermatology to remove what he thinks was a basal cell carcinoma from his left neck. This continues to heal nicely and he follows up their office next week.  He is still on PCN for osteomyelitis of the jaw.   M-spike in September was 0.4, IgG level 856 mg/dL was and lambda light chain 9.35 mg/dL. He has had a runny nose and a scratchy throat today.  No fever, chills, n/v, cough, rash, dizziness, SOB, chest pain, palpitations, abdominal pain or changes in bowel or bladder habits.  No swelling in his extremities at this time. His neuropathy in the hands and feet is stable.  No falls or syncopal episodes to report.  No lymphadenopathy noted on exam.  He has had no episodes of abnormal bleeding. His skin is thin and he does bruise easily.  He has maintained a good appetite and is staying well hydrated. His weight is stable.   ECOG Performance Status: 1 - Symptomatic but completely ambulatory  Medications:  Allergies as of 06/11/2018      Reactions   Sulfa Antibiotics Itching   Sulfasalazine Itching      Medication List        Accurate as of 06/11/18  3:23 PM. Always  use your most recent med list.          amLODipine-benazepril 5-10 MG capsule Commonly known as:  LOTREL TAKE 1 CAPSULE BY MOUTH DAILY   chlorhexidine 0.12 % solution Commonly known as:  PERIDEX SWISH WITH 1/2 OUNCE FOR 30 SECONDS THEN SPIT BID   MULTIVITAL PO Take 1 tablet by mouth every morning.   penicillin v potassium 500 MG tablet Commonly known as:  VEETID Take 1 tablet (500 mg total) by mouth QID.   pyridOXINE 100 MG tablet Commonly known as:  VITAMIN B-6 Take 100 mg by mouth daily.   VITAMIN B 12 PO Take 1,000 mcg by mouth every morning.   Vitamin D (Ergocalciferol) 50000 units Caps capsule Commonly known as:  DRISDOL Take 50,000 Units by mouth every 7 (seven) days. Sundays       Allergies:  Allergies  Allergen Reactions  . Sulfa Antibiotics Itching  . Sulfasalazine Itching    Past Medical History, Surgical history, Social history, and Family History were reviewed and updated.  Review of Systems: All other 10 point review of systems is negative.   Physical Exam:  weight is 156 lb (70.8 kg). His oral temperature is 97.6 F (36.4 C). His blood pressure is 154/79 (abnormal) and his pulse is 80. His respiration is 18 and oxygen saturation is 100%.   Wt Readings from Last 3 Encounters:  06/11/18 156 lb (70.8 kg)  05/26/18 156 lb (70.8 kg)  04/30/18 152  lb (68.9 kg)    Ocular: Sclerae unicteric, pupils equal, round and reactive to light Ear-nose-throat: Oropharynx clear, dentition fair Lymphatic: No cervical, supraclavicular or axillary adenopathy Lungs no rales or rhonchi, good excursion bilaterally Heart regular rate and rhythm, no murmur appreciated Abd soft, nontender, positive bowel sounds, no liver or spleen tip palpated on exam, no fluid wave MSK no focal spinal tenderness, no joint edema Neuro: non-focal, well-oriented, appropriate affect Breasts: Deferred   Lab Results  Component Value Date   WBC 5.5 06/11/2018   HGB 9.8 (L) 06/11/2018     HCT 29.9 (L) 06/11/2018   MCV 93.7 06/11/2018   PLT 181 06/11/2018   Lab Results  Component Value Date   FERRITIN 118 03/26/2018   IRON 87 03/26/2018   TIBC 245 03/26/2018   UIBC 158 03/26/2018   IRONPCTSAT 36 (L) 03/26/2018   Lab Results  Component Value Date   RBC 3.19 (L) 06/11/2018   Lab Results  Component Value Date   KPAFRELGTCHN 20.6 (H) 04/30/2018   LAMBDASER 93.5 (H) 04/30/2018   KAPLAMBRATIO 0.22 (L) 04/30/2018   Lab Results  Component Value Date   IGGSERUM 856 04/30/2018   IGGSERUM 900 04/30/2018   IGA 67 04/30/2018   IGA 69 04/30/2018   IGMSERUM 18 04/30/2018   IGMSERUM 20 04/30/2018   Lab Results  Component Value Date   TOTALPROTELP 6.1 04/30/2018   ALBUMINELP 3.5 04/30/2018   A1GS 0.2 04/30/2018   A2GS 0.7 04/30/2018   BETS 0.8 04/30/2018   BETA2SER 0.3 07/28/2015   GAMS 0.8 04/30/2018   MSPIKE 0.4 (H) 04/30/2018   SPEI Comment 03/05/2018     Chemistry      Component Value Date/Time   NA 141 06/11/2018 1257   NA 146 (H) 08/07/2017 1153   NA 141 01/09/2017 1004   K 5.0 (H) 06/11/2018 1257   K 4.6 08/07/2017 1153   K 4.4 01/09/2017 1004   CL 110 (H) 06/11/2018 1257   CL 108 08/07/2017 1153   CO2 24 06/11/2018 1257   CO2 26 08/07/2017 1153   CO2 22 01/09/2017 1004   BUN 28 (H) 06/11/2018 1257   BUN 24 (H) 08/07/2017 1153   BUN 23.9 01/09/2017 1004   CREATININE 2.00 (H) 06/11/2018 1257   CREATININE 1.56 (H) 05/26/2018 1508   CREATININE 1.5 (H) 01/09/2017 1004      Component Value Date/Time   CALCIUM 9.3 06/11/2018 1257   CALCIUM 8.8 08/07/2017 1153   CALCIUM 8.8 01/09/2017 1004   ALKPHOS 77 06/11/2018 1257   ALKPHOS 97 (H) 08/07/2017 1153   ALKPHOS 80 01/09/2017 1004   AST 28 06/11/2018 1257   AST 18 01/09/2017 1004   ALT 18 06/11/2018 1257   ALT 20 08/07/2017 1153   ALT 12 01/09/2017 1004   BILITOT 0.9 06/11/2018 1257   BILITOT 0.92 01/09/2017 1004       Impression and Plan: Mr. Colleran is a very pleasant 84 yo caucasian  gentleman with relapsed IgG lambda myeloma.  We will proceed with treatment today as planned.  We will see him back in another month.  They will contact our office with any questions or concerns. We can certainly see him sooner if need be.   Laverna Peace, NP 10/31/20193:23 PM

## 2018-06-12 LAB — KAPPA/LAMBDA LIGHT CHAINS
Kappa free light chain: 16.1 mg/L (ref 3.3–19.4)
Kappa, lambda light chain ratio: 0.13 — ABNORMAL LOW (ref 0.26–1.65)
Lambda free light chains: 126.6 mg/L — ABNORMAL HIGH (ref 5.7–26.3)

## 2018-06-12 LAB — IGG, IGA, IGM
IGG (IMMUNOGLOBIN G), SERUM: 1033 mg/dL (ref 700–1600)
IgA: 65 mg/dL (ref 61–437)
IgM (Immunoglobulin M), Srm: 22 mg/dL (ref 15–143)

## 2018-06-14 ENCOUNTER — Other Ambulatory Visit: Payer: Self-pay | Admitting: Hematology & Oncology

## 2018-06-14 DIAGNOSIS — I10 Essential (primary) hypertension: Secondary | ICD-10-CM

## 2018-06-18 LAB — IMMUNOFIXATION REFLEX, SERUM
IGG (IMMUNOGLOBIN G), SERUM: 1090 mg/dL (ref 700–1600)
IgA: 72 mg/dL (ref 61–437)
IgM (Immunoglobulin M), Srm: 25 mg/dL (ref 15–143)

## 2018-06-18 LAB — PROTEIN ELECTROPHORESIS, SERUM, WITH REFLEX
A/G RATIO SPE: 1.3 (ref 0.7–1.7)
Albumin ELP: 3.6 g/dL (ref 2.9–4.4)
Alpha-1-Globulin: 0.3 g/dL (ref 0.0–0.4)
Alpha-2-Globulin: 0.8 g/dL (ref 0.4–1.0)
BETA GLOBULIN: 0.8 g/dL (ref 0.7–1.3)
GAMMA GLOBULIN: 1 g/dL (ref 0.4–1.8)
GLOBULIN, TOTAL: 2.7 g/dL (ref 2.2–3.9)
M-Spike, %: 0.7 g/dL — ABNORMAL HIGH
SPEP Interpretation: 0
TOTAL PROTEIN ELP: 6.3 g/dL (ref 6.0–8.5)

## 2018-06-29 DIAGNOSIS — E7849 Other hyperlipidemia: Secondary | ICD-10-CM | POA: Diagnosis not present

## 2018-06-29 DIAGNOSIS — R82998 Other abnormal findings in urine: Secondary | ICD-10-CM | POA: Diagnosis not present

## 2018-06-29 DIAGNOSIS — M859 Disorder of bone density and structure, unspecified: Secondary | ICD-10-CM | POA: Diagnosis not present

## 2018-06-29 DIAGNOSIS — I10 Essential (primary) hypertension: Secondary | ICD-10-CM | POA: Diagnosis not present

## 2018-06-29 DIAGNOSIS — Z125 Encounter for screening for malignant neoplasm of prostate: Secondary | ICD-10-CM | POA: Diagnosis not present

## 2018-07-01 ENCOUNTER — Other Ambulatory Visit: Payer: Self-pay | Admitting: *Deleted

## 2018-07-01 DIAGNOSIS — C9002 Multiple myeloma in relapse: Secondary | ICD-10-CM

## 2018-07-02 ENCOUNTER — Inpatient Hospital Stay: Payer: Medicare HMO | Attending: Hematology & Oncology

## 2018-07-02 ENCOUNTER — Inpatient Hospital Stay: Payer: Medicare HMO

## 2018-07-02 VITALS — BP 151/75 | HR 79 | Temp 98.0°F | Resp 16

## 2018-07-02 DIAGNOSIS — Z5111 Encounter for antineoplastic chemotherapy: Secondary | ICD-10-CM | POA: Insufficient documentation

## 2018-07-02 DIAGNOSIS — C9002 Multiple myeloma in relapse: Secondary | ICD-10-CM | POA: Insufficient documentation

## 2018-07-02 DIAGNOSIS — Z5112 Encounter for antineoplastic immunotherapy: Secondary | ICD-10-CM | POA: Insufficient documentation

## 2018-07-02 DIAGNOSIS — C9000 Multiple myeloma not having achieved remission: Secondary | ICD-10-CM

## 2018-07-02 LAB — CBC WITH DIFFERENTIAL (CANCER CENTER ONLY)
Abs Immature Granulocytes: 0.02 10*3/uL (ref 0.00–0.07)
BASOS PCT: 1 %
Basophils Absolute: 0.1 10*3/uL (ref 0.0–0.1)
EOS ABS: 0.2 10*3/uL (ref 0.0–0.5)
EOS PCT: 3 %
HCT: 31 % — ABNORMAL LOW (ref 39.0–52.0)
Hemoglobin: 10.3 g/dL — ABNORMAL LOW (ref 13.0–17.0)
Immature Granulocytes: 0 %
Lymphocytes Relative: 5 %
Lymphs Abs: 0.3 10*3/uL — ABNORMAL LOW (ref 0.7–4.0)
MCH: 31.6 pg (ref 26.0–34.0)
MCHC: 33.2 g/dL (ref 30.0–36.0)
MCV: 95.1 fL (ref 80.0–100.0)
MONO ABS: 0.7 10*3/uL (ref 0.1–1.0)
Monocytes Relative: 12 %
NRBC: 0 % (ref 0.0–0.2)
Neutro Abs: 4.6 10*3/uL (ref 1.7–7.7)
Neutrophils Relative %: 79 %
PLATELETS: 186 10*3/uL (ref 150–400)
RBC: 3.26 MIL/uL — AB (ref 4.22–5.81)
RDW: 13.2 % (ref 11.5–15.5)
WBC: 5.8 10*3/uL (ref 4.0–10.5)

## 2018-07-02 LAB — CMP (CANCER CENTER ONLY)
ALBUMIN: 3.3 g/dL — AB (ref 3.5–5.0)
ALK PHOS: 77 U/L (ref 26–84)
ALT: 19 U/L (ref 10–47)
AST: 27 U/L (ref 11–38)
Anion gap: 5 (ref 5–15)
BILIRUBIN TOTAL: 0.9 mg/dL (ref 0.2–1.6)
BUN: 31 mg/dL — AB (ref 7–22)
CO2: 24 mmol/L (ref 18–33)
CREATININE: 1.9 mg/dL — AB (ref 0.60–1.20)
Calcium: 9.1 mg/dL (ref 8.0–10.3)
Chloride: 112 mmol/L — ABNORMAL HIGH (ref 98–108)
Glucose, Bld: 124 mg/dL — ABNORMAL HIGH (ref 73–118)
Potassium: 4.9 mmol/L — ABNORMAL HIGH (ref 3.3–4.7)
SODIUM: 141 mmol/L (ref 128–145)
TOTAL PROTEIN: 7 g/dL (ref 6.4–8.1)

## 2018-07-02 MED ORDER — DEXAMETHASONE SODIUM PHOSPHATE 10 MG/ML IJ SOLN
INTRAMUSCULAR | Status: AC
  Filled 2018-07-02: qty 1

## 2018-07-02 MED ORDER — PALONOSETRON HCL INJECTION 0.25 MG/5ML
INTRAVENOUS | Status: AC
  Filled 2018-07-02: qty 5

## 2018-07-02 MED ORDER — SODIUM CHLORIDE 0.9 % IV SOLN
300.0000 mg/m2 | Freq: Once | INTRAVENOUS | Status: AC
  Administered 2018-07-02: 540 mg via INTRAVENOUS
  Filled 2018-07-02: qty 27

## 2018-07-02 MED ORDER — SODIUM CHLORIDE 0.9 % IV SOLN
Freq: Once | INTRAVENOUS | Status: AC
  Administered 2018-07-02: 15:00:00 via INTRAVENOUS
  Filled 2018-07-02: qty 250

## 2018-07-02 MED ORDER — DEXTROSE 5 % IV SOLN
60.0000 mg | Freq: Once | INTRAVENOUS | Status: AC
  Administered 2018-07-02: 60 mg via INTRAVENOUS
  Filled 2018-07-02: qty 30

## 2018-07-02 MED ORDER — SODIUM CHLORIDE 0.9 % IV SOLN
Freq: Once | INTRAVENOUS | Status: DC
  Filled 2018-07-02: qty 250

## 2018-07-02 MED ORDER — PALONOSETRON HCL INJECTION 0.25 MG/5ML
0.2500 mg | Freq: Once | INTRAVENOUS | Status: AC
  Administered 2018-07-02: 0.25 mg via INTRAVENOUS

## 2018-07-02 MED ORDER — DEXAMETHASONE SODIUM PHOSPHATE 10 MG/ML IJ SOLN
10.0000 mg | Freq: Once | INTRAMUSCULAR | Status: AC
  Administered 2018-07-02: 10 mg via INTRAVENOUS

## 2018-07-02 NOTE — Progress Notes (Signed)
Ok to treat with creatinine of 1.9 per Dr Marin Olp. dph

## 2018-07-02 NOTE — Patient Instructions (Signed)
Cyclophosphamide injection What is this medicine? CYCLOPHOSPHAMIDE (sye kloe FOSS fa mide) is a chemotherapy drug. It slows the growth of cancer cells. This medicine is used to treat many types of cancer like lymphoma, myeloma, leukemia, breast cancer, and ovarian cancer, to name a few. This medicine may be used for other purposes; ask your health care provider or pharmacist if you have questions. COMMON BRAND NAME(S): Cytoxan, Neosar What should I tell my health care provider before I take this medicine? They need to know if you have any of these conditions: -blood disorders -history of other chemotherapy -infection -kidney disease -liver disease -recent or ongoing radiation therapy -tumors in the bone marrow -an unusual or allergic reaction to cyclophosphamide, other chemotherapy, other medicines, foods, dyes, or preservatives -pregnant or trying to get pregnant -breast-feeding How should I use this medicine? This drug is usually given as an injection into a vein or muscle or by infusion into a vein. It is administered in a hospital or clinic by a specially trained health care professional. Talk to your pediatrician regarding the use of this medicine in children. Special care may be needed. Overdosage: If you think you have taken too much of this medicine contact a poison control center or emergency room at once. NOTE: This medicine is only for you. Do not share this medicine with others. What if I miss a dose? It is important not to miss your dose. Call your doctor or health care professional if you are unable to keep an appointment. What may interact with this medicine? This medicine may interact with the following medications: -amiodarone -amphotericin B -azathioprine -certain antiviral medicines for HIV or AIDS such as protease inhibitors (e.g., indinavir, ritonavir) and zidovudine -certain blood pressure medications such as benazepril, captopril, enalapril, fosinopril,  lisinopril, moexipril, monopril, perindopril, quinapril, ramipril, trandolapril -certain cancer medications such as anthracyclines (e.g., daunorubicin, doxorubicin), busulfan, cytarabine, paclitaxel, pentostatin, tamoxifen, trastuzumab -certain diuretics such as chlorothiazide, chlorthalidone, hydrochlorothiazide, indapamide, metolazone -certain medicines that treat or prevent blood clots like warfarin -certain muscle relaxants such as succinylcholine -cyclosporine -etanercept -indomethacin -medicines to increase blood counts like filgrastim, pegfilgrastim, sargramostim -medicines used as general anesthesia -metronidazole -natalizumab This list may not describe all possible interactions. Give your health care provider a list of all the medicines, herbs, non-prescription drugs, or dietary supplements you use. Also tell them if you smoke, drink alcohol, or use illegal drugs. Some items may interact with your medicine. What should I watch for while using this medicine? Visit your doctor for checks on your progress. This drug may make you feel generally unwell. This is not uncommon, as chemotherapy can affect healthy cells as well as cancer cells. Report any side effects. Continue your course of treatment even though you feel ill unless your doctor tells you to stop. Drink water or other fluids as directed. Urinate often, even at night. In some cases, you may be given additional medicines to help with side effects. Follow all directions for their use. Call your doctor or health care professional for advice if you get a fever, chills or sore throat, or other symptoms of a cold or flu. Do not treat yourself. This drug decreases your body's ability to fight infections. Try to avoid being around people who are sick. This medicine may increase your risk to bruise or bleed. Call your doctor or health care professional if you notice any unusual bleeding. Be careful brushing and flossing your teeth or using a  toothpick because you may get an infection or bleed   more easily. If you have any dental work done, tell your dentist you are receiving this medicine. You may get drowsy or dizzy. Do not drive, use machinery, or do anything that needs mental alertness until you know how this medicine affects you. Do not become pregnant while taking this medicine or for 1 year after stopping it. Women should inform their doctor if they wish to become pregnant or think they might be pregnant. Men should not father a child while taking this medicine and for 4 months after stopping it. There is a potential for serious side effects to an unborn child. Talk to your health care professional or pharmacist for more information. Do not breast-feed an infant while taking this medicine. This medicine may interfere with the ability to have a child. This medicine has caused ovarian failure in some women. This medicine has caused reduced sperm counts in some men. You should talk with your doctor or health care professional if you are concerned about your fertility. If you are going to have surgery, tell your doctor or health care professional that you have taken this medicine. What side effects may I notice from receiving this medicine? Side effects that you should report to your doctor or health care professional as soon as possible: -allergic reactions like skin rash, itching or hives, swelling of the face, lips, or tongue -low blood counts - this medicine may decrease the number of white blood cells, red blood cells and platelets. You may be at increased risk for infections and bleeding. -signs of infection - fever or chills, cough, sore throat, pain or difficulty passing urine -signs of decreased platelets or bleeding - bruising, pinpoint red spots on the skin, black, tarry stools, blood in the urine -signs of decreased red blood cells - unusually weak or tired, fainting spells, lightheadedness -breathing problems -dark  urine -dizziness -palpitations -swelling of the ankles, feet, hands -trouble passing urine or change in the amount of urine -weight gain -yellowing of the eyes or skin Side effects that usually do not require medical attention (report to your doctor or health care professional if they continue or are bothersome): -changes in nail or skin color -hair loss -missed menstrual periods -mouth sores -nausea, vomiting This list may not describe all possible side effects. Call your doctor for medical advice about side effects. You may report side effects to FDA at 1-800-FDA-1088. Where should I keep my medicine? This drug is given in a hospital or clinic and will not be stored at home. NOTE: This sheet is a summary. It may not cover all possible information. If you have questions about this medicine, talk to your doctor, pharmacist, or health care provider.  2018 Elsevier/Gold Standard (2012-06-12 16:22:58) Carfilzomib injection What is this medicine? CARFILZOMIB (kar FILZ oh mib) targets a specific protein within cancer cells and stops the cancer cells from growing. It is used to treat multiple myeloma. This medicine may be used for other purposes; ask your health care provider or pharmacist if you have questions. COMMON BRAND NAME(S): KYPROLIS What should I tell my health care provider before I take this medicine? They need to know if you have any of these conditions: -heart disease -history of blood clots -irregular heartbeat -kidney disease -liver disease -lung or breathing disease -an unusual or allergic reaction to carfilzomib, or other medicines, foods, dyes, or preservatives -pregnant or trying to get pregnant -breast-feeding How should I use this medicine? This medicine is for injection or infusion into a vein. It is given  by a health care professional in a hospital or clinic setting. Talk to your pediatrician regarding the use of this medicine in children. Special care may be  needed. Overdosage: If you think you have taken too much of this medicine contact a poison control center or emergency room at once. NOTE: This medicine is only for you. Do not share this medicine with others. What if I miss a dose? It is important not to miss your dose. Call your doctor or health care professional if you are unable to keep an appointment. What may interact with this medicine? Interactions are not expected. Give your health care provider a list of all the medicines, herbs, non-prescription drugs, or dietary supplements you use. Also tell them if you smoke, drink alcohol, or use illegal drugs. Some items may interact with your medicine. This list may not describe all possible interactions. Give your health care provider a list of all the medicines, herbs, non-prescription drugs, or dietary supplements you use. Also tell them if you smoke, drink alcohol, or use illegal drugs. Some items may interact with your medicine. What should I watch for while using this medicine? Your condition will be monitored carefully while you are receiving this medicine. Report any side effects. Continue your course of treatment even though you feel ill unless your doctor tells you to stop. You may need blood work done while you are taking this medicine. Do not become pregnant while taking this medicine or for at least 30 days after stopping it. Women should inform their doctor if they wish to become pregnant or think they might be pregnant. There is a potential for serious side effects to an unborn child. Men should not father a child while taking this medicine and for 90 days after stopping it. Talk to your health care professional or pharmacist for more information. Do not breast-feed an infant while taking this medicine. Check with your doctor or health care professional if you get an attack of severe diarrhea, nausea and vomiting, or if you sweat a lot. The loss of too much body fluid can make it  dangerous for you to take this medicine. You may get dizzy. Do not drive, use machinery, or do anything that needs mental alertness until you know how this medicine affects you. Do not stand or sit up quickly, especially if you are an older patient. This reduces the risk of dizzy or fainting spells. What side effects may I notice from receiving this medicine? Side effects that you should report to your doctor or health care professional as soon as possible: -allergic reactions like skin rash, itching or hives, swelling of the face, lips, or tongue -confusion -dizziness -feeling faint or lightheaded -fever or chills -palpitations -seizures -signs and symptoms of bleeding such as bloody or black, tarry stools; red or dark-brown urine; spitting up blood or brown material that looks like coffee grounds; red spots on the skin; unusual bruising or bleeding including from the eye, gums, or nose -signs and symptoms of a blood clot such as breathing problems; changes in vision; chest pain; severe, sudden headache; pain, swelling, warmth in the leg; trouble speaking; sudden numbness or weakness of the face, arm or leg -signs and symptoms of kidney injury like trouble passing urine or change in the amount of urine -signs and symptoms of liver injury like dark yellow or brown urine; general ill feeling or flu-like symptoms; light-colored stools; loss of appetite; nausea; right upper belly pain; unusually weak or tired; yellowing of the   eyes or skin Side effects that usually do not require medical attention (report to your doctor or health care professional if they continue or are bothersome): -back pain -cough -diarrhea -headache -muscle cramps -vomiting This list may not describe all possible side effects. Call your doctor for medical advice about side effects. You may report side effects to FDA at 1-800-FDA-1088. Where should I keep my medicine? This drug is given in a hospital or clinic and will not  be stored at home. NOTE: This sheet is a summary. It may not cover all possible information. If you have questions about this medicine, talk to your doctor, pharmacist, or health care provider.  2018 Elsevier/Gold Standard (2015-08-31 13:39:23)  

## 2018-07-06 DIAGNOSIS — C9000 Multiple myeloma not having achieved remission: Secondary | ICD-10-CM | POA: Diagnosis not present

## 2018-07-06 DIAGNOSIS — I10 Essential (primary) hypertension: Secondary | ICD-10-CM | POA: Diagnosis not present

## 2018-07-06 DIAGNOSIS — E7849 Other hyperlipidemia: Secondary | ICD-10-CM | POA: Diagnosis not present

## 2018-07-06 DIAGNOSIS — K4090 Unilateral inguinal hernia, without obstruction or gangrene, not specified as recurrent: Secondary | ICD-10-CM | POA: Diagnosis not present

## 2018-07-06 DIAGNOSIS — R972 Elevated prostate specific antigen [PSA]: Secondary | ICD-10-CM | POA: Diagnosis not present

## 2018-07-06 DIAGNOSIS — N189 Chronic kidney disease, unspecified: Secondary | ICD-10-CM

## 2018-07-06 DIAGNOSIS — M859 Disorder of bone density and structure, unspecified: Secondary | ICD-10-CM | POA: Diagnosis not present

## 2018-07-06 DIAGNOSIS — D6489 Other specified anemias: Secondary | ICD-10-CM | POA: Diagnosis not present

## 2018-07-06 DIAGNOSIS — N183 Chronic kidney disease, stage 3 (moderate): Secondary | ICD-10-CM | POA: Diagnosis not present

## 2018-07-06 DIAGNOSIS — Z0000 Encounter for general adult medical examination without abnormal findings: Secondary | ICD-10-CM | POA: Diagnosis not present

## 2018-07-06 DIAGNOSIS — Z1389 Encounter for screening for other disorder: Secondary | ICD-10-CM | POA: Diagnosis not present

## 2018-07-06 HISTORY — DX: Chronic kidney disease, unspecified: N18.9

## 2018-07-08 DIAGNOSIS — H0288A Meibomian gland dysfunction right eye, upper and lower eyelids: Secondary | ICD-10-CM | POA: Diagnosis not present

## 2018-07-08 DIAGNOSIS — H0288B Meibomian gland dysfunction left eye, upper and lower eyelids: Secondary | ICD-10-CM | POA: Diagnosis not present

## 2018-07-08 DIAGNOSIS — H04123 Dry eye syndrome of bilateral lacrimal glands: Secondary | ICD-10-CM | POA: Diagnosis not present

## 2018-07-08 DIAGNOSIS — H353131 Nonexudative age-related macular degeneration, bilateral, early dry stage: Secondary | ICD-10-CM | POA: Diagnosis not present

## 2018-07-12 ENCOUNTER — Other Ambulatory Visit: Payer: Self-pay | Admitting: Hematology & Oncology

## 2018-07-12 DIAGNOSIS — I10 Essential (primary) hypertension: Secondary | ICD-10-CM

## 2018-07-23 ENCOUNTER — Other Ambulatory Visit: Payer: Self-pay

## 2018-07-23 ENCOUNTER — Inpatient Hospital Stay: Payer: Medicare HMO | Attending: Hematology & Oncology | Admitting: Family

## 2018-07-23 ENCOUNTER — Inpatient Hospital Stay: Payer: Medicare HMO

## 2018-07-23 ENCOUNTER — Encounter: Payer: Self-pay | Admitting: Family

## 2018-07-23 VITALS — BP 156/71 | HR 74 | Temp 97.6°F | Resp 18 | Wt 156.0 lb

## 2018-07-23 DIAGNOSIS — N289 Disorder of kidney and ureter, unspecified: Secondary | ICD-10-CM

## 2018-07-23 DIAGNOSIS — C9002 Multiple myeloma in relapse: Secondary | ICD-10-CM

## 2018-07-23 DIAGNOSIS — D649 Anemia, unspecified: Secondary | ICD-10-CM | POA: Diagnosis not present

## 2018-07-23 DIAGNOSIS — Z5111 Encounter for antineoplastic chemotherapy: Secondary | ICD-10-CM | POA: Diagnosis present

## 2018-07-23 DIAGNOSIS — Z923 Personal history of irradiation: Secondary | ICD-10-CM

## 2018-07-23 DIAGNOSIS — G629 Polyneuropathy, unspecified: Secondary | ICD-10-CM

## 2018-07-23 DIAGNOSIS — C9000 Multiple myeloma not having achieved remission: Secondary | ICD-10-CM

## 2018-07-23 DIAGNOSIS — Z5112 Encounter for antineoplastic immunotherapy: Secondary | ICD-10-CM | POA: Diagnosis present

## 2018-07-23 DIAGNOSIS — D508 Other iron deficiency anemias: Secondary | ICD-10-CM

## 2018-07-23 LAB — CMP (CANCER CENTER ONLY)
ALT: 13 U/L (ref 0–44)
ANION GAP: 7 (ref 5–15)
AST: 19 U/L (ref 15–41)
Albumin: 3.7 g/dL (ref 3.5–5.0)
Alkaline Phosphatase: 67 U/L (ref 38–126)
BUN: 30 mg/dL — ABNORMAL HIGH (ref 8–23)
CALCIUM: 8.5 mg/dL — AB (ref 8.9–10.3)
CHLORIDE: 110 mmol/L (ref 98–111)
CO2: 21 mmol/L — ABNORMAL LOW (ref 22–32)
Creatinine: 1.61 mg/dL — ABNORMAL HIGH (ref 0.61–1.24)
GFR, EST NON AFRICAN AMERICAN: 38 mL/min — AB (ref >60)
GFR, Est AFR Am: 44 mL/min — ABNORMAL LOW (ref >60)
Glucose, Bld: 121 mg/dL — ABNORMAL HIGH (ref 70–99)
POTASSIUM: 4.6 mmol/L (ref 3.5–5.1)
Sodium: 138 mmol/L (ref 135–145)
Total Bilirubin: 0.7 mg/dL (ref 0.3–1.2)
Total Protein: 6.3 g/dL — ABNORMAL LOW (ref 6.5–8.1)

## 2018-07-23 LAB — CBC WITH DIFFERENTIAL (CANCER CENTER ONLY)
ABS IMMATURE GRANULOCYTES: 0.01 10*3/uL (ref 0.00–0.07)
BASOS PCT: 1 %
Basophils Absolute: 0.1 10*3/uL (ref 0.0–0.1)
EOS PCT: 7 %
Eosinophils Absolute: 0.3 10*3/uL (ref 0.0–0.5)
HCT: 28.6 % — ABNORMAL LOW (ref 39.0–52.0)
HEMOGLOBIN: 9.5 g/dL — AB (ref 13.0–17.0)
Immature Granulocytes: 0 %
LYMPHS ABS: 0.3 10*3/uL — AB (ref 0.7–4.0)
LYMPHS PCT: 7 %
MCH: 31.4 pg (ref 26.0–34.0)
MCHC: 33.2 g/dL (ref 30.0–36.0)
MCV: 94.4 fL (ref 80.0–100.0)
Monocytes Absolute: 0.6 10*3/uL (ref 0.1–1.0)
Monocytes Relative: 15 %
NEUTROS ABS: 3 10*3/uL (ref 1.7–7.7)
Neutrophils Relative %: 70 %
Platelet Count: 165 10*3/uL (ref 150–400)
RBC: 3.03 MIL/uL — ABNORMAL LOW (ref 4.22–5.81)
RDW: 13.6 % (ref 11.5–15.5)
WBC Count: 4.2 10*3/uL (ref 4.0–10.5)
nRBC: 0 % (ref 0.0–0.2)

## 2018-07-23 MED ORDER — DEXTROSE 5 % IV SOLN
60.0000 mg | Freq: Once | INTRAVENOUS | Status: AC
  Administered 2018-07-23: 60 mg via INTRAVENOUS
  Filled 2018-07-23: qty 30

## 2018-07-23 MED ORDER — DARBEPOETIN ALFA 300 MCG/0.6ML IJ SOSY
300.0000 ug | PREFILLED_SYRINGE | INTRAMUSCULAR | Status: DC
  Administered 2018-07-23: 300 ug via SUBCUTANEOUS

## 2018-07-23 MED ORDER — DARBEPOETIN ALFA 300 MCG/0.6ML IJ SOSY
PREFILLED_SYRINGE | INTRAMUSCULAR | Status: AC
  Filled 2018-07-23: qty 0.6

## 2018-07-23 MED ORDER — DEXAMETHASONE SODIUM PHOSPHATE 10 MG/ML IJ SOLN
INTRAMUSCULAR | Status: AC
  Filled 2018-07-23: qty 1

## 2018-07-23 MED ORDER — DEXAMETHASONE SODIUM PHOSPHATE 10 MG/ML IJ SOLN
10.0000 mg | Freq: Once | INTRAMUSCULAR | Status: AC
  Administered 2018-07-23: 10 mg via INTRAVENOUS

## 2018-07-23 MED ORDER — SODIUM CHLORIDE 0.9 % IV SOLN
Freq: Once | INTRAVENOUS | Status: AC
  Administered 2018-07-23: 15:00:00 via INTRAVENOUS
  Filled 2018-07-23: qty 250

## 2018-07-23 MED ORDER — PALONOSETRON HCL INJECTION 0.25 MG/5ML
0.2500 mg | Freq: Once | INTRAVENOUS | Status: AC
  Administered 2018-07-23: 0.25 mg via INTRAVENOUS

## 2018-07-23 MED ORDER — PALONOSETRON HCL INJECTION 0.25 MG/5ML
INTRAVENOUS | Status: AC
  Filled 2018-07-23: qty 5

## 2018-07-23 MED ORDER — SODIUM CHLORIDE 0.9 % IV SOLN
300.0000 mg/m2 | Freq: Once | INTRAVENOUS | Status: AC
  Administered 2018-07-23: 540 mg via INTRAVENOUS
  Filled 2018-07-23: qty 27

## 2018-07-23 NOTE — Progress Notes (Signed)
Hematology and Oncology Follow Up Visit  EEAN BUSS [id number] 1930-02-04 84 y.o. 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 Rostad is here today with his grandson for follow-up. He is doing well and his left neck incision site has healed nicely. He states that he had an xray last week to evaluate the osteomyelitis which showed marginal progress on PCN and he will continue his same regimen.  He denies fatigue at this time.  M-spike in October was 0.7, IgG level 1,033 mg/dL and lambda light chains 12.66. Today's results are pending.  No fever, chills, n/v, cough, rash, dizziness, SOB, chest pain, palpitations, abdominal pain or changes in bowel or bladder habits.  He has a right inguinal hernia and is ordering a truss to wear for support. He states that his PCP does not feel he needs surgery at this time.  The neuropathy in his hands and feet is stable and unchanged. He states that this has not effected his dexterity or gait.  No falls or syncopal episodes.  No swelling or tenderness in her extremities.  No lymphadenopathy noted on exam.  His appetite comes and goes and he is trying to stay well hydrated. His weight is stable at 156 lbs.   ECOG Performance Status: 1 - Symptomatic but completely ambulatory  Medications:  Allergies as of 07/23/2018      Reactions   Sulfa Antibiotics Itching   Sulfasalazine Itching      Medication List       Accurate as of July 23, 2018  1:27 PM. Always use your most recent med list.        amLODipine-benazepril 5-10 MG capsule Commonly known as:  LOTREL TAKE 1 CAPSULE BY MOUTH DAILY   chlorhexidine 0.12 % solution Commonly known as:  PERIDEX SWISH WITH 1/2 OUNCE FOR 30 SECONDS THEN SPIT BID   MULTIVITAL PO Take 1 tablet by mouth every morning.   penicillin v potassium 500 MG tablet Commonly known as:  VEETID Take 1 tablet (500 mg total) by mouth QID.   pyridOXINE 100 MG tablet Commonly known as:  VITAMIN B-6 Take 100 mg by mouth daily.   VITAMIN B 12 PO Take 1,000 mcg by mouth every morning.   Vitamin D (Ergocalciferol) 1.25 MG (50000 UT) Caps capsule Commonly known as:  DRISDOL Take 50,000 Units by mouth every 7 (seven) days. Sundays       Allergies:  Allergies  Allergen Reactions  . Sulfa Antibiotics Itching  . Sulfasalazine Itching    Past Medical History, Surgical history, Social history, and Family History were reviewed and updated.  Review of Systems: All other 10 point review of systems is negative.   Physical Exam:  vitals were not taken for this visit.   Wt Readings from Last 3 Encounters:  06/11/18 156 lb (70.8 kg)  05/26/18 156 lb (70.8 kg)  04/30/18 152 lb (68.9 kg)    Ocular: Sclerae unicteric, pupils equal, round and reactive to light Ear-nose-throat: Oropharynx  clear, dentition fair Lymphatic: No cervical, supraclavicular or axillary adenopathy Lungs no rales or rhonchi, good excursion bilaterally Heart regular rate and rhythm, no murmur appreciated Abd soft, nontender, positive bowel sounds, no liver or spleen tip palpated on exam, no fluid wave  MSK no focal spinal tenderness, no joint edema Neuro: non-focal, well-oriented, appropriate affect Breasts: Deferred   Lab Results  Component Value Date   WBC 5.8 07/02/2018   HGB 10.3 (L) 07/02/2018   HCT 31.0 (L) 07/02/2018   MCV 95.1 07/02/2018   PLT 186 07/02/2018   Lab  Results  Component Value Date   FERRITIN 118 03/26/2018   IRON 87 03/26/2018   TIBC 245 03/26/2018   UIBC 158 03/26/2018   IRONPCTSAT 36 (L) 03/26/2018   Lab Results  Component Value Date   RBC 3.26 (L) 07/02/2018   Lab Results  Component Value Date   KPAFRELGTCHN 16.1 06/11/2018   LAMBDASER 126.6 (H) 06/11/2018   KAPLAMBRATIO 0.13 (L) 06/11/2018   Lab Results  Component Value Date   IGGSERUM 1,033 06/11/2018   IGGSERUM 1,090 06/11/2018   IGA 65 06/11/2018   IGA 72 06/11/2018   IGMSERUM 22 06/11/2018   IGMSERUM 25 06/11/2018   Lab Results  Component Value Date   TOTALPROTELP 6.3 06/11/2018   ALBUMINELP 3.6 06/11/2018   A1GS 0.3 06/11/2018   A2GS 0.8 06/11/2018   BETS 0.8 06/11/2018   BETA2SER 0.3 07/28/2015   GAMS 1.0 06/11/2018   MSPIKE 0.7 (H) 06/11/2018   SPEI Comment 03/05/2018     Chemistry      Component Value Date/Time   NA 141 07/02/2018 1412   NA 146 (H) 08/07/2017 1153   NA 141 01/09/2017 1004   K 4.9 (H) 07/02/2018 1412   K 4.6 08/07/2017 1153   K 4.4 01/09/2017 1004   CL 112 (H) 07/02/2018 1412   CL 108 08/07/2017 1153   CO2 24 07/02/2018 1412   CO2 26 08/07/2017 1153   CO2 22 01/09/2017 1004   BUN 31 (H) 07/02/2018 1412   BUN 24 (H) 08/07/2017 1153   BUN 23.9 01/09/2017 1004   CREATININE 1.90 (H) 07/02/2018 1412   CREATININE 1.56 (H) 05/26/2018 1508   CREATININE 1.5 (H) 01/09/2017 1004      Component Value Date/Time   CALCIUM 9.1 07/02/2018 1412   CALCIUM 8.8 08/07/2017 1153   CALCIUM 8.8 01/09/2017 1004   ALKPHOS 77 07/02/2018 1412   ALKPHOS 97 (H) 08/07/2017 1153   ALKPHOS 80 01/09/2017 1004   AST 27 07/02/2018 1412   AST 18 01/09/2017 1004   ALT 19 07/02/2018 1412   ALT 20 08/07/2017 1153   ALT 12 01/09/2017 1004   BILITOT 0.9 07/02/2018 1412   BILITOT 0.92 01/09/2017 1004       Impression and Plan: Mr. Tatar is a very pleasant 84 yo caucasian gentleman with relapsed IgG lambda myeloma.  Myeloma studies for today are  pending.  We will proceed with treatment today as planned.  We will see what his iron studies show and bring him back in for infusion if needed.  We will see him again in a month for MD follow-up and treatment.  They will contact our office with any questions or concerns. We can certainly see him sooner if need be.   Laverna Peace, NP 12/12/20191:27 PM

## 2018-07-23 NOTE — Patient Instructions (Signed)
Dexamethasone injection What is this medicine? DEXAMETHASONE (dex a METH a sone) is a corticosteroid. It is used to treat inflammation of the skin, joints, lungs, and other organs. Common conditions treated include asthma, allergies, and arthritis. It is also used for other conditions, like blood disorders and diseases of the adrenal glands. This medicine may be used for other purposes; ask your health care provider or pharmacist if you have questions. COMMON BRAND NAME(S): Decadron, DoubleDex, Simplist Dexamethasone, Solurex What should I tell my health care provider before I take this medicine? They need to know if you have any of these conditions: -blood clotting problems -Cushing's syndrome -diabetes -glaucoma -heart problems or disease -high blood pressure -infection like herpes, measles, tuberculosis, or chickenpox -kidney disease -liver disease -mental problems -myasthenia gravis -osteoporosis -previous heart attack -seizures -stomach, ulcer or intestine disease including colitis and diverticulitis -thyroid problem -an unusual or allergic reaction to dexamethasone, corticosteroids, other medicines, lactose, foods, dyes, or preservatives -pregnant or trying to get pregnant -breast-feeding How should I use this medicine? This medicine is for injection into a muscle, joint, lesion, soft tissue, or vein. It is given by a health care professional in a hospital or clinic setting. Talk to your pediatrician regarding the use of this medicine in children. Special care may be needed. Overdosage: If you think you have taken too much of this medicine contact a poison control center or emergency room at once. NOTE: This medicine is only for you. Do not share this medicine with others. What if I miss a dose? This may not apply. If you are having a series of injections over a prolonged period, try not to miss an appointment. Call your doctor or health care professional to reschedule if you  are unable to keep an appointment. What may interact with this medicine? Do not take this medicine with any of the following medications: -mifepristone, RU-486 -vaccines This medicine may also interact with the following medications: -amphotericin B -antibiotics like clarithromycin, erythromycin, and troleandomycin -aspirin and aspirin-like drugs -barbiturates like phenobarbital -carbamazepine -cholestyramine -cholinesterase inhibitors like donepezil, galantamine, rivastigmine, and tacrine -cyclosporine -digoxin -diuretics -ephedrine -male hormones, like estrogens or progestins and birth control pills -indinavir -isoniazid -ketoconazole -medicines for diabetes -medicines that improve muscle tone or strength for conditions like myasthenia gravis -NSAIDs, medicines for pain and inflammation, like ibuprofen or naproxen -phenytoin -rifampin -thalidomide -warfarin This list may not describe all possible interactions. Give your health care provider a list of all the medicines, herbs, non-prescription drugs, or dietary supplements you use. Also tell them if you smoke, drink alcohol, or use illegal drugs. Some items may interact with your medicine. What should I watch for while using this medicine? Your condition will be monitored carefully while you are receiving this medicine. If you are taking this medicine for a long time, carry an identification card with your name and address, the type and dose of your medicine, and your doctor's name and address. This medicine may increase your risk of getting an infection. Stay away from people who are sick. Tell your doctor or health care professional if you are around anyone with measles or chickenpox. Talk to your health care provider before you get any vaccines that you take this medicine. If you are going to have surgery, tell your doctor or health care professional that you have taken this medicine within the last twelve months. Ask your  doctor or health care professional about your diet. You may need to lower the amount of salt you eat. The  medicine can increase your blood sugar. If you are a diabetic check with your doctor if you need help adjusting the dose of your diabetic medicine. What side effects may I notice from receiving this medicine? Side effects that you should report to your doctor or health care professional as soon as possible: -allergic reactions like skin rash, itching or hives, swelling of the face, lips, or tongue -black or tarry stools -change in the amount of urine -changes in vision -confusion, excitement, restlessness, a false sense of well-being -fever, sore throat, sneezing, cough, or other signs of infection, wounds that will not heal -hallucinations -increased thirst -mental depression, mood swings, mistaken feelings of self importance or of being mistreated -pain in hips, back, ribs, arms, shoulders, or legs -pain, redness, or irritation at the injection site -redness, blistering, peeling or loosening of the skin, including inside the mouth -rounding out of face -swelling of feet or lower legs -unusual bleeding or bruising -unusual tired or weak -wounds that do not heal Side effects that usually do not require medical attention (report to your doctor or health care professional if they continue or are bothersome): -diarrhea or constipation -change in taste -headache -nausea, vomiting -skin problems, acne, thin and shiny skin -touble sleeping -unusual growth of hair on the face or body -weight gain This list may not describe all possible side effects. Call your doctor for medical advice about side effects. You may report side effects to FDA at 1-800-FDA-1088. Where should I keep my medicine? This drug is given in a hospital or clinic and will not be stored at home. NOTE: This sheet is a summary. It may not cover all possible information. If you have questions about this medicine, talk to  your doctor, pharmacist, or health care provider.  2018 Elsevier/Gold Standard (2007-11-19 14:04:12) Palonosetron Injection What is this medicine? PALONOSETRON (pal oh NOE se tron) is used to prevent nausea and vomiting caused by chemotherapy. It also helps prevent delayed nausea and vomiting that may occur a few days after your treatment. This medicine may be used for other purposes; ask your health care provider or pharmacist if you have questions. COMMON BRAND NAME(S): Aloxi What should I tell my health care provider before I take this medicine? They need to know if you have any of these conditions: -an unusual or allergic reaction to palonosetron, dolasetron, granisetron, ondansetron, other medicines, foods, dyes, or preservatives -pregnant or trying to get pregnant -breast-feeding How should I use this medicine? This medicine is for infusion into a vein. It is given by a health care professional in a hospital or clinic setting. Talk to your pediatrician regarding the use of this medicine in children. While this drug may be prescribed for children as young as 1 month for selected conditions, precautions do apply. Overdosage: If you think you have taken too much of this medicine contact a poison control center or emergency room at once. NOTE: This medicine is only for you. Do not share this medicine with others. What if I miss a dose? This does not apply. What may interact with this medicine? -certain medicines for depression, anxiety, or psychotic disturbances -fentanyl -linezolid -MAOIs like Carbex, Eldepryl, Marplan, Nardil, and Parnate -methylene blue (injected into a vein) -tramadol This list may not describe all possible interactions. Give your health care provider a list of all the medicines, herbs, non-prescription drugs, or dietary supplements you use. Also tell them if you smoke, drink alcohol, or use illegal drugs. Some items may interact with your medicine.  What should I  watch for while using this medicine? Your condition will be monitored carefully while you are receiving this medicine. What side effects may I notice from receiving this medicine? Side effects that you should report to your doctor or health care professional as soon as possible: -allergic reactions like skin rash, itching or hives, swelling of the face, lips, or tongue -breathing problems -confusion -dizziness -fast, irregular heartbeat -fever and chills -loss of balance or coordination -seizures -sweating -swelling of the hands and feet -tremors -unusually weak or tired Side effects that usually do not require medical attention (report to your doctor or health care professional if they continue or are bothersome): -constipation or diarrhea -headache This list may not describe all possible side effects. Call your doctor for medical advice about side effects. You may report side effects to FDA at 1-800-FDA-1088. Where should I keep my medicine? This drug is given in a hospital or clinic and will not be stored at home. NOTE: This sheet is a summary. It may not cover all possible information. If you have questions about this medicine, talk to your doctor, pharmacist, or health care provider.  2018 Elsevier/Gold Standard (2013-06-04 10:38:36) Darbepoetin Alfa injection What is this medicine? DARBEPOETIN ALFA (dar be POE e tin AL fa) helps your body make more red blood cells. It is used to treat anemia caused by chronic kidney failure and chemotherapy. This medicine may be used for other purposes; ask your health care provider or pharmacist if you have questions. COMMON BRAND NAME(S): Aranesp What should I tell my health care provider before I take this medicine? They need to know if you have any of these conditions: -blood clotting disorders or history of blood clots -cancer patient not on chemotherapy -cystic fibrosis -heart disease, such as angina, heart failure, or a history of a  heart attack -hemoglobin level of 12 g/dL or greater -high blood pressure -low levels of folate, iron, or vitamin B12 -seizures -an unusual or allergic reaction to darbepoetin, erythropoietin, albumin, hamster proteins, latex, other medicines, foods, dyes, or preservatives -pregnant or trying to get pregnant -breast-feeding How should I use this medicine? This medicine is for injection into a vein or under the skin. It is usually given by a health care professional in a hospital or clinic setting. If you get this medicine at home, you will be taught how to prepare and give this medicine. Use exactly as directed. Take your medicine at regular intervals. Do not take your medicine more often than directed. It is important that you put your used needles and syringes in a special sharps container. Do not put them in a trash can. If you do not have a sharps container, call your pharmacist or healthcare provider to get one. A special MedGuide will be given to you by the pharmacist with each prescription and refill. Be sure to read this information carefully each time. Talk to your pediatrician regarding the use of this medicine in children. While this medicine may be used in children as young as 1 year for selected conditions, precautions do apply. Overdosage: If you think you have taken too much of this medicine contact a poison control center or emergency room at once. NOTE: This medicine is only for you. Do not share this medicine with others. What if I miss a dose? If you miss a dose, take it as soon as you can. If it is almost time for your next dose, take only that dose. Do not take double or  extra doses. What may interact with this medicine? Do not take this medicine with any of the following medications: -epoetin alfa This list may not describe all possible interactions. Give your health care provider a list of all the medicines, herbs, non-prescription drugs, or dietary supplements you use.  Also tell them if you smoke, drink alcohol, or use illegal drugs. Some items may interact with your medicine. What should I watch for while using this medicine? Your condition will be monitored carefully while you are receiving this medicine. You may need blood work done while you are taking this medicine. What side effects may I notice from receiving this medicine? Side effects that you should report to your doctor or health care professional as soon as possible: -allergic reactions like skin rash, itching or hives, swelling of the face, lips, or tongue -breathing problems -changes in vision -chest pain -confusion, trouble speaking or understanding -feeling faint or lightheaded, falls -high blood pressure -muscle aches or pains -pain, swelling, warmth in the leg -rapid weight gain -severe headaches -sudden numbness or weakness of the face, arm or leg -trouble walking, dizziness, loss of balance or coordination -seizures (convulsions) -swelling of the ankles, feet, hands -unusually weak or tired Side effects that usually do not require medical attention (report to your doctor or health care professional if they continue or are bothersome): -diarrhea -fever, chills (flu-like symptoms) -headaches -nausea, vomiting -redness, stinging, or swelling at site where injected This list may not describe all possible side effects. Call your doctor for medical advice about side effects. You may report side effects to FDA at 1-800-FDA-1088. Where should I keep my medicine? Keep out of the reach of children. Store in a refrigerator between 2 and 8 degrees C (36 and 46 degrees F). Do not freeze. Do not shake. Throw away any unused portion if using a single-dose vial. Throw away any unused medicine after the expiration date. NOTE: This sheet is a summary. It may not cover all possible information. If you have questions about this medicine, talk to your doctor, pharmacist, or health care provider.   2018 Elsevier/Gold Standard (2016-03-18 19:52:26) Cyclophosphamide injection What is this medicine? CYCLOPHOSPHAMIDE (sye kloe FOSS fa mide) is a chemotherapy drug. It slows the growth of cancer cells. This medicine is used to treat many types of cancer like lymphoma, myeloma, leukemia, breast cancer, and ovarian cancer, to name a few. This medicine may be used for other purposes; ask your health care provider or pharmacist if you have questions. COMMON BRAND NAME(S): Cytoxan, Neosar What should I tell my health care provider before I take this medicine? They need to know if you have any of these conditions: -blood disorders -history of other chemotherapy -infection -kidney disease -liver disease -recent or ongoing radiation therapy -tumors in the bone marrow -an unusual or allergic reaction to cyclophosphamide, other chemotherapy, other medicines, foods, dyes, or preservatives -pregnant or trying to get pregnant -breast-feeding How should I use this medicine? This drug is usually given as an injection into a vein or muscle or by infusion into a vein. It is administered in a hospital or clinic by a specially trained health care professional. Talk to your pediatrician regarding the use of this medicine in children. Special care may be needed. Overdosage: If you think you have taken too much of this medicine contact a poison control center or emergency room at once. NOTE: This medicine is only for you. Do not share this medicine with others. What if I miss a dose? It  is important not to miss your dose. Call your doctor or health care professional if you are unable to keep an appointment. What may interact with this medicine? This medicine may interact with the following medications: -amiodarone -amphotericin B -azathioprine -certain antiviral medicines for HIV or AIDS such as protease inhibitors (e.g., indinavir, ritonavir) and zidovudine -certain blood pressure medications such as  benazepril, captopril, enalapril, fosinopril, lisinopril, moexipril, monopril, perindopril, quinapril, ramipril, trandolapril -certain cancer medications such as anthracyclines (e.g., daunorubicin, doxorubicin), busulfan, cytarabine, paclitaxel, pentostatin, tamoxifen, trastuzumab -certain diuretics such as chlorothiazide, chlorthalidone, hydrochlorothiazide, indapamide, metolazone -certain medicines that treat or prevent blood clots like warfarin -certain muscle relaxants such as succinylcholine -cyclosporine -etanercept -indomethacin -medicines to increase blood counts like filgrastim, pegfilgrastim, sargramostim -medicines used as general anesthesia -metronidazole -natalizumab This list may not describe all possible interactions. Give your health care provider a list of all the medicines, herbs, non-prescription drugs, or dietary supplements you use. Also tell them if you smoke, drink alcohol, or use illegal drugs. Some items may interact with your medicine. What should I watch for while using this medicine? Visit your doctor for checks on your progress. This drug may make you feel generally unwell. This is not uncommon, as chemotherapy can affect healthy cells as well as cancer cells. Report any side effects. Continue your course of treatment even though you feel ill unless your doctor tells you to stop. Drink water or other fluids as directed. Urinate often, even at night. In some cases, you may be given additional medicines to help with side effects. Follow all directions for their use. Call your doctor or health care professional for advice if you get a fever, chills or sore throat, or other symptoms of a cold or flu. Do not treat yourself. This drug decreases your body's ability to fight infections. Try to avoid being around people who are sick. This medicine may increase your risk to bruise or bleed. Call your doctor or health care professional if you notice any unusual bleeding. Be  careful brushing and flossing your teeth or using a toothpick because you may get an infection or bleed more easily. If you have any dental work done, tell your dentist you are receiving this medicine. You may get drowsy or dizzy. Do not drive, use machinery, or do anything that needs mental alertness until you know how this medicine affects you. Do not become pregnant while taking this medicine or for 1 year after stopping it. Women should inform their doctor if they wish to become pregnant or think they might be pregnant. Men should not father a child while taking this medicine and for 4 months after stopping it. There is a potential for serious side effects to an unborn child. Talk to your health care professional or pharmacist for more information. Do not breast-feed an infant while taking this medicine. This medicine may interfere with the ability to have a child. This medicine has caused ovarian failure in some women. This medicine has caused reduced sperm counts in some men. You should talk with your doctor or health care professional if you are concerned about your fertility. If you are going to have surgery, tell your doctor or health care professional that you have taken this medicine. What side effects may I notice from receiving this medicine? Side effects that you should report to your doctor or health care professional as soon as possible: -allergic reactions like skin rash, itching or hives, swelling of the face, lips, or tongue -low blood counts -  this medicine may decrease the number of white blood cells, red blood cells and platelets. You may be at increased risk for infections and bleeding. -signs of infection - fever or chills, cough, sore throat, pain or difficulty passing urine -signs of decreased platelets or bleeding - bruising, pinpoint red spots on the skin, black, tarry stools, blood in the urine -signs of decreased red blood cells - unusually weak or tired, fainting spells,  lightheadedness -breathing problems -dark urine -dizziness -palpitations -swelling of the ankles, feet, hands -trouble passing urine or change in the amount of urine -weight gain -yellowing of the eyes or skin Side effects that usually do not require medical attention (report to your doctor or health care professional if they continue or are bothersome): -changes in nail or skin color -hair loss -missed menstrual periods -mouth sores -nausea, vomiting This list may not describe all possible side effects. Call your doctor for medical advice about side effects. You may report side effects to FDA at 1-800-FDA-1088. Where should I keep my medicine? This drug is given in a hospital or clinic and will not be stored at home. NOTE: This sheet is a summary. It may not cover all possible information. If you have questions about this medicine, talk to your doctor, pharmacist, or health care provider.  2018 [employer]/Gold Standard (2012-06-12 16:22:58) Carfilzomib injection What is this medicine? CARFILZOMIB (kar FILZ oh mib) targets a specific protein within cancer cells and stops the cancer cells from growing. It is used to treat multiple myeloma. This medicine may be used for other purposes; ask your health care provider or pharmacist if you have questions. COMMON BRAND NAME(S): KYPROLIS What should I tell my health care provider before I take this medicine? They need to know if you have any of these conditions: -heart disease -history of blood clots -irregular heartbeat -kidney disease -liver disease -lung or breathing disease -an unusual or allergic reaction to carfilzomib, or other medicines, foods, dyes, or preservatives -pregnant or trying to get pregnant -breast-feeding How should I use this medicine? This medicine is for injection or infusion into a vein. It is given by a health care professional in a hospital or clinic setting. Talk to your pediatrician regarding the use of this  medicine in children. Special care may be needed. Overdosage: If you think you have taken too much of this medicine contact a poison control center or emergency room at once. NOTE: This medicine is only for you. Do not share this medicine with others. What if I miss a dose? It is important not to miss your dose. Call your doctor or health care professional if you are unable to keep an appointment. What may interact with this medicine? Interactions are not expected. Give your health care provider a list of all the medicines, herbs, non-prescription drugs, or dietary supplements you use. Also tell them if you smoke, drink alcohol, or use illegal drugs. Some items may interact with your medicine. This list may not describe all possible interactions. Give your health care provider a list of all the medicines, herbs, non-prescription drugs, or dietary supplements you use. Also tell them if you smoke, drink alcohol, or use illegal drugs. Some items may interact with your medicine. What should I watch for while using this medicine? Your condition will be monitored carefully while you are receiving this medicine. Report any side effects. Continue your course of treatment even though you feel ill unless your doctor tells you to stop. You may need blood work done while  you are taking this medicine. Do not become pregnant while taking this medicine or for at least 30 days after stopping it. Women should inform their doctor if they wish to become pregnant or think they might be pregnant. There is a potential for serious side effects to an unborn child. Men should not father a child while taking this medicine and for 90 days after stopping it. Talk to your health care professional or pharmacist for more information. Do not breast-feed an infant while taking this medicine. Check with your doctor or health care professional if you get an attack of severe diarrhea, nausea and vomiting, or if you sweat a lot. The loss  of too much body fluid can make it dangerous for you to take this medicine. You may get dizzy. Do not drive, use machinery, or do anything that needs mental alertness until you know how this medicine affects you. Do not stand or sit up quickly, especially if you are an older patient. This reduces the risk of dizzy or fainting spells. What side effects may I notice from receiving this medicine? Side effects that you should report to your doctor or health care professional as soon as possible: -allergic reactions like skin rash, itching or hives, swelling of the face, lips, or tongue -confusion -dizziness -feeling faint or lightheaded -fever or chills -palpitations -seizures -signs and symptoms of bleeding such as bloody or black, tarry stools; red or dark-brown urine; spitting up blood or brown material that looks like coffee grounds; red spots on the skin; unusual bruising or bleeding including from the eye, gums, or nose -signs and symptoms of a blood clot such as breathing problems; changes in vision; chest pain; severe, sudden headache; pain, swelling, warmth in the leg; trouble speaking; sudden numbness or weakness of the face, arm or leg -signs and symptoms of kidney injury like trouble passing urine or change in the amount of urine -signs and symptoms of liver injury like dark yellow or brown urine; general ill feeling or flu-like symptoms; light-colored stools; loss of appetite; nausea; right upper belly pain; unusually weak or tired; yellowing of the eyes or skin Side effects that usually do not require medical attention (report to your doctor or health care professional if they continue or are bothersome): -back pain -cough -diarrhea -headache -muscle cramps -vomiting This list may not describe all possible side effects. Call your doctor for medical advice about side effects. You may report side effects to FDA at 1-800-FDA-1088. Where should I keep my medicine? This drug is given in  a hospital or clinic and will not be stored at home. NOTE: This sheet is a summary. It may not cover all possible information. If you have questions about this medicine, talk to your doctor, pharmacist, or health care provider.  2018 Elsevier/Gold Standard (2015-08-31 13:39:23)

## 2018-07-24 LAB — IRON AND TIBC
Iron: 98 ug/dL (ref 42–163)
Saturation Ratios: 42 % (ref 20–55)
TIBC: 236 ug/dL (ref 202–409)
UIBC: 138 ug/dL (ref 117–376)

## 2018-07-24 LAB — FERRITIN: Ferritin: 61 ng/mL (ref 24–336)

## 2018-07-24 LAB — KAPPA/LAMBDA LIGHT CHAINS
KAPPA, LAMDA LIGHT CHAIN RATIO: 0.11 — AB (ref 0.26–1.65)
Kappa free light chain: 18.9 mg/L (ref 3.3–19.4)
LAMDA FREE LIGHT CHAINS: 164.7 mg/L — AB (ref 5.7–26.3)

## 2018-07-24 LAB — IGG, IGA, IGM
IGG (IMMUNOGLOBIN G), SERUM: 1259 mg/dL (ref 700–1600)
IgA: 59 mg/dL — ABNORMAL LOW (ref 61–437)
IgM (Immunoglobulin M), Srm: 23 mg/dL (ref 15–143)

## 2018-07-27 LAB — PROTEIN ELECTROPHORESIS, SERUM
A/G Ratio: 1.3 (ref 0.7–1.7)
Albumin ELP: 3.5 g/dL (ref 2.9–4.4)
Alpha-1-Globulin: 0.2 g/dL (ref 0.0–0.4)
Alpha-2-Globulin: 0.7 g/dL (ref 0.4–1.0)
Beta Globulin: 0.7 g/dL (ref 0.7–1.3)
Gamma Globulin: 1 g/dL (ref 0.4–1.8)
Globulin, Total: 2.6 g/dL (ref 2.2–3.9)
M-Spike, %: 0.8 g/dL — ABNORMAL HIGH
Total Protein ELP: 6.1 g/dL (ref 6.0–8.5)

## 2018-07-29 ENCOUNTER — Encounter: Payer: Self-pay | Admitting: Internal Medicine

## 2018-07-29 ENCOUNTER — Ambulatory Visit: Payer: Medicare HMO | Admitting: Internal Medicine

## 2018-07-29 VITALS — BP 161/78 | HR 79 | Temp 98.0°F | Ht 70.0 in | Wt 157.0 lb

## 2018-07-29 DIAGNOSIS — M272 Inflammatory conditions of jaws: Secondary | ICD-10-CM

## 2018-07-29 NOTE — Progress Notes (Signed)
Patient ID: Christian Burns, male   DOB: [birth year], 84 y.o.   MRN: [id number]  HPI Christian Burns is an 84 yo M with recurrent IgG lambda myeloma followed by dr Marin Olp as well as Dr Buelah Manis for follow up on jaw osteomyelitis. He had repeat ct of jaw that did not show any worsening of process. He states that he currently has someMolar discomfort which is unrelated. He feels that he is still doing well no drainage. Continues to be on penicillin QID.   Outpatient Encounter Medications as of 07/29/2018  Medication Sig  . amLODipine-benazepril (LOTREL) 5-10 MG capsule TAKE 1 CAPSULE BY MOUTH DAILY  . chlorhexidine (PERIDEX) 0.12 % solution SWISH WITH 1/2 OUNCE FOR 30 SECONDS THEN SPIT BID  . Cyanocobalamin (VITAMIN B 12 PO) Take 1,000 mcg by mouth every morning.   . finasteride (PROSCAR) 5 MG tablet Take 5 mg by mouth daily.  . Multiple Vitamins-Minerals (MULTIVITAL PO) Take 1 tablet by mouth every morning.   . penicillin v potassium (VEETID) 500 MG tablet Take 1 tablet (500 mg total) by mouth QID.  Marland Kitchen pyridOXINE (VITAMIN B-6) 100 MG tablet Take 100 mg by mouth daily.   . Vitamin D, Ergocalciferol, (DRISDOL) 50000 UNITS CAPS capsule Take 50,000 Units by mouth every 7 (seven) days. Sundays   No facility-administered encounter medications on file as of 07/29/2018.      Patient Active Problem List   Diagnosis Date Noted  . Iron deficiency anemia secondary to inadequate dietary iron intake 07/18/2016  . Multiple myeloma in relapse ([place]) 04/05/2016  . 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 Multiple myeloma in remission ([place]) 09/08/2015  . Myeloma ([place]) 08/23/2011     Health Maintenance Due  Topic Date Due  . TETANUS/TDAP  05/01/1951  . PNA vac Low Risk Adult (1 of 2 - PCV13) 04/30/1997     Review of Systems 12 point ros is otherwise negative Physical Exam   BP (!) 161/78   Pulse 79   Temp 98 F (36.7 C)   Ht _0  (1.778 m)   Wt 157 lb (71.2 kg)   BMI 22.53 kg/m     Physical Exam  Constitutional: He is oriented to person, place, and time. He appears well-developed and well-nourished. No distress.  HENT:  Mouth/Throat: Oropharynx is clear and moist. No oropharyngeal exudate. Oral - lower teeth absent. Poor dentition. No LAD noted,  Neurological: He is alert and oriented to person, place, and time.  Skin: Skin is warm and dry. No rash noted. No erythema.  Psychiatric: He has a normal mood and affect. His behavior is normal.     CBC Lab Results  Component Value Date   WBC 4.2 07/23/2018   RBC 3.03 (L) 07/23/2018   HGB 9.5 (L) 07/23/2018   HCT 28.6 (L) 07/23/2018   PLT 165 07/23/2018   MCV 94.4 07/23/2018   MCH 31.4 07/23/2018   MCHC 33.2 07/23/2018   RDW 13.6 07/23/2018   LYMPHSABS 0.3 (L) 07/23/2018   MONOABS 0.6 07/23/2018   EOSABS 0.3 07/23/2018    BMET Lab Results  Component Value Date   NA 138 07/23/2018   K 4.6 07/23/2018   CL 110 07/23/2018   CO2 21 (L) 07/23/2018   GLUCOSE 121 (H) 07/23/2018   BUN 30 (H) 07/23/2018   CREATININE 1.61 (H) 07/23/2018   CALCIUM 8.5 (L) 07/23/2018   GFRNONAA 38 (L) 07/23/2018   GFRAA 44 (L) 07/23/2018    Lab Results  Component Value  Date   ESRSEDRATE 1 (H) 05/26/2018   Lab Results  Component Value Date   CRP 1.9 05/26/2018    Assessment and Plan  Jaw osteomyelitis =  Will review images - to see if need to do IV therapy, but suspect since images are not worse, plan to continue on oral abtx

## 2018-08-13 ENCOUNTER — Encounter: Payer: Self-pay | Admitting: Hematology & Oncology

## 2018-08-13 ENCOUNTER — Ambulatory Visit (HOSPITAL_BASED_OUTPATIENT_CLINIC_OR_DEPARTMENT_OTHER)
Admission: RE | Admit: 2018-08-13 | Discharge: 2018-08-13 | Disposition: A | Discharge status: Home / Self Care | Payer: Medicare HMO | Source: Ambulatory Visit | Attending: Hematology & Oncology | Admitting: Hematology & Oncology

## 2018-08-13 ENCOUNTER — Other Ambulatory Visit: Payer: Self-pay | Admitting: Hematology & Oncology

## 2018-08-13 ENCOUNTER — Other Ambulatory Visit: Payer: Self-pay | Admitting: *Deleted

## 2018-08-13 ENCOUNTER — Other Ambulatory Visit: Payer: Self-pay

## 2018-08-13 ENCOUNTER — Inpatient Hospital Stay: Payer: Medicare HMO

## 2018-08-13 ENCOUNTER — Inpatient Hospital Stay: Payer: Medicare HMO | Attending: Hematology & Oncology | Admitting: Hematology & Oncology

## 2018-08-13 VITALS — BP 159/74 | HR 70 | Temp 97.7°F | Resp 18 | Wt 154.0 lb

## 2018-08-13 DIAGNOSIS — Z923 Personal history of irradiation: Secondary | ICD-10-CM | POA: Diagnosis not present

## 2018-08-13 DIAGNOSIS — Z5111 Encounter for antineoplastic chemotherapy: Secondary | ICD-10-CM | POA: Insufficient documentation

## 2018-08-13 DIAGNOSIS — Z79899 Other long term (current) drug therapy: Secondary | ICD-10-CM

## 2018-08-13 DIAGNOSIS — C9002 Multiple myeloma in relapse: Secondary | ICD-10-CM | POA: Diagnosis not present

## 2018-08-13 DIAGNOSIS — G620 Drug-induced polyneuropathy: Secondary | ICD-10-CM | POA: Insufficient documentation

## 2018-08-13 DIAGNOSIS — D508 Other iron deficiency anemias: Secondary | ICD-10-CM

## 2018-08-13 DIAGNOSIS — M25551 Pain in right hip: Secondary | ICD-10-CM | POA: Insufficient documentation

## 2018-08-13 DIAGNOSIS — N289 Disorder of kidney and ureter, unspecified: Secondary | ICD-10-CM | POA: Insufficient documentation

## 2018-08-13 DIAGNOSIS — D649 Anemia, unspecified: Secondary | ICD-10-CM | POA: Diagnosis not present

## 2018-08-13 DIAGNOSIS — C9000 Multiple myeloma not having achieved remission: Secondary | ICD-10-CM

## 2018-08-13 DIAGNOSIS — Z5112 Encounter for antineoplastic immunotherapy: Secondary | ICD-10-CM | POA: Diagnosis present

## 2018-08-13 LAB — CBC WITH DIFFERENTIAL (CANCER CENTER ONLY)
Abs Immature Granulocytes: 0.03 10*3/uL (ref 0.00–0.07)
Basophils Absolute: 0.1 10*3/uL (ref 0.0–0.1)
Basophils Relative: 1 %
Eosinophils Absolute: 0.3 10*3/uL (ref 0.0–0.5)
Eosinophils Relative: 7 %
HCT: 35.8 % — ABNORMAL LOW (ref 39.0–52.0)
Hemoglobin: 11.2 g/dL — ABNORMAL LOW (ref 13.0–17.0)
Immature Granulocytes: 1 %
LYMPHS PCT: 6 %
Lymphs Abs: 0.3 10*3/uL — ABNORMAL LOW (ref 0.7–4.0)
MCH: 30.5 pg (ref 26.0–34.0)
MCHC: 31.3 g/dL (ref 30.0–36.0)
MCV: 97.5 fL (ref 80.0–100.0)
Monocytes Absolute: 0.6 10*3/uL (ref 0.1–1.0)
Monocytes Relative: 14 %
Neutro Abs: 3.1 10*3/uL (ref 1.7–7.7)
Neutrophils Relative %: 71 %
Platelet Count: 208 10*3/uL (ref 150–400)
RBC: 3.67 MIL/uL — ABNORMAL LOW (ref 4.22–5.81)
RDW: 14.4 % (ref 11.5–15.5)
WBC Count: 4.3 10*3/uL (ref 4.0–10.5)
nRBC: 0 % (ref 0.0–0.2)

## 2018-08-13 LAB — CMP (CANCER CENTER ONLY)
ALBUMIN: 3.8 g/dL (ref 3.5–5.0)
ALK PHOS: 78 U/L (ref 38–126)
ALT: 14 U/L (ref 0–44)
ANION GAP: 5 (ref 5–15)
AST: 23 U/L (ref 15–41)
BUN: 30 mg/dL — ABNORMAL HIGH (ref 8–23)
CO2: 23 mmol/L (ref 22–32)
Calcium: 8.6 mg/dL — ABNORMAL LOW (ref 8.9–10.3)
Chloride: 109 mmol/L (ref 98–111)
Creatinine: 1.62 mg/dL — ABNORMAL HIGH (ref 0.61–1.24)
GFR, Est AFR Am: 44 mL/min — ABNORMAL LOW (ref >60)
GFR, Estimated: 38 mL/min — ABNORMAL LOW (ref >60)
GLUCOSE: 103 mg/dL — AB (ref 70–99)
Potassium: 4.7 mmol/L (ref 3.5–5.1)
Sodium: 137 mmol/L (ref 135–145)
Total Bilirubin: 0.8 mg/dL (ref 0.3–1.2)
Total Protein: 7.1 g/dL (ref 6.5–8.1)

## 2018-08-13 MED ORDER — PALONOSETRON HCL INJECTION 0.25 MG/5ML
INTRAVENOUS | Status: AC
  Filled 2018-08-13: qty 5

## 2018-08-13 MED ORDER — SODIUM CHLORIDE 0.9 % IV SOLN
Freq: Once | INTRAVENOUS | Status: AC
  Administered 2018-08-13: 15:00:00 via INTRAVENOUS
  Filled 2018-08-13: qty 250

## 2018-08-13 MED ORDER — PALONOSETRON HCL INJECTION 0.25 MG/5ML
0.2500 mg | Freq: Once | INTRAVENOUS | Status: AC
  Administered 2018-08-13: 0.25 mg via INTRAVENOUS

## 2018-08-13 MED ORDER — SODIUM CHLORIDE 0.9 % IV SOLN
300.0000 mg/m2 | Freq: Once | INTRAVENOUS | Status: AC
  Administered 2018-08-13: 540 mg via INTRAVENOUS
  Filled 2018-08-13: qty 27

## 2018-08-13 MED ORDER — DEXAMETHASONE SODIUM PHOSPHATE 10 MG/ML IJ SOLN
INTRAMUSCULAR | Status: AC
  Filled 2018-08-13: qty 1

## 2018-08-13 MED ORDER — POMALIDOMIDE 2 MG PO CAPS: 2.0000 mg | ORAL_CAPSULE | Freq: Every day | ORAL | 0 refills | Status: DC

## 2018-08-13 MED ORDER — DEXAMETHASONE SODIUM PHOSPHATE 10 MG/ML IJ SOLN
10.0000 mg | Freq: Once | INTRAMUSCULAR | Status: AC
  Administered 2018-08-13: 10 mg via INTRAVENOUS

## 2018-08-13 MED ORDER — POMALIDOMIDE 2 MG PO CAPS: 2.0000 mg | ORAL_CAPSULE | Freq: Every day | ORAL | 4 refills | Status: DC

## 2018-08-13 MED ORDER — DEXTROSE 5 % IV SOLN
75.0000 mg | Freq: Once | INTRAVENOUS | Status: AC
  Administered 2018-08-13: 76 mg via INTRAVENOUS
  Filled 2018-08-13: qty 30

## 2018-08-13 NOTE — Patient Instructions (Signed)
[facility] Discharge Instructions for Patients Receiving Chemotherapy  Today you received the following chemotherapy agents kyprolis and cytoxan.  To help prevent nausea and vomiting after your treatment, we encourage you to take your nausea medication as directed BUT NO ZOFRAN FOR 3 DAYS AFTER CHEMO.  If you develop nausea and vomiting that is not controlled by your nausea medication, call the clinic.   BELOW ARE SYMPTOMS THAT SHOULD BE REPORTED IMMEDIATELY:  *FEVER GREATER THAN 100.5 F  *CHILLS WITH OR WITHOUT FEVER  NAUSEA AND VOMITING THAT IS NOT CONTROLLED WITH YOUR NAUSEA MEDICATION  *UNUSUAL SHORTNESS OF BREATH  *UNUSUAL BRUISING OR BLEEDING  TENDERNESS IN MOUTH AND THROAT WITH OR WITHOUT PRESENCE OF ULCERS  *URINARY PROBLEMS  *BOWEL PROBLEMS  UNUSUAL RASH Items with * indicate a potential emergency and should be followed up as soon as possible.  Feel free to call the clinic you have any questions or concerns. The clinic phone number is [phone].  Please show the [facility] at check-in to the Emergency Department and triage nurse.

## 2018-08-13 NOTE — Progress Notes (Signed)
Hematology and Oncology Follow Up Visit  Christian Burns [id number] October 21, 1929 85 y.o. 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 He is doing well and his left neck incision site has healed nicely. He states that he had an xray last week to evaluate the osteomyelitis which showed marginal progress on PCN and he will continue his same regimen.  He denies fatigue at this time.  M-spike in October was 0.7, IgG level 1,033 mg/dL and lambda light chains 12.66. Today's results are pending.  No fever, chills, n/v, cough, rash, dizziness, SOB, chest pain, palpitations, abdominal pain or changes in bowel or bladder habits.  He has a right inguinal hernia and is ordering a truss to wear for support. He states that his PCP does not feel he needs surgery at this time.  The neuropathy in his hands and feet is stable and unchanged. He states that this has not effected his dexterity or gait.  No falls or syncopal episodes.  No swelling or tenderness in her extremities.  No lymphadenopathy noted on exam.  His appetite comes and goes and he is trying to stay well hydrated. His weight is stable at 156 lbs.   ECOG Performance Status: 1 - Symptomatic but completely ambulatory  Medications:  Allergies as of 08/13/2018      Reactions   Sulfa Antibiotics Itching   Sulfasalazine Itching      Medication List       Accurate as of August 13, 2018  2:02 PM. Always use your most recent med list.        amLODipine-benazepril 5-10 MG capsule Commonly known as:  LOTREL TAKE 1 CAPSULE BY MOUTH DAILY   chlorhexidine 0.12 % solution Commonly known as:  PERIDEX SWISH WITH 1/2 OUNCE FOR 30 SECONDS THEN SPIT BID   finasteride 5 MG tablet Commonly known as:  PROSCAR Take 5 mg by mouth daily.   MULTIVITAL PO Take 1 tablet by mouth every morning.   penicillin v potassium 500 MG tablet Commonly known as:  VEETID Take 1 tablet (500 mg total) by mouth QID.   pyridOXINE 100 MG tablet Commonly known as:  VITAMIN B-6 Take 100 mg by mouth daily.   VITAMIN B 12 PO Take 1,000 mcg by mouth every morning.   Vitamin D (Ergocalciferol) 1.25 MG (50000 UT) Caps capsule Commonly known as:  DRISDOL Take 50,000 Units by mouth every 7 (seven) days. Sundays       Allergies:  Allergies  Allergen Reactions  . Sulfa Antibiotics Itching  . Sulfasalazine Itching    Past Medical History, Surgical history, Social history, and Family History were reviewed and updated.  Review of Systems: All other 10 point review of systems is negative.   Physical Exam:  weight is 154 lb (69.9 kg). His oral temperature is 97.7 F (36.5 C). His blood pressure is 159/74 (abnormal) and his pulse is 70. His respiration is 18 and oxygen  saturation is 100%.   Wt Readings from Last 3 Encounters:  08/13/18 154 lb (69.9 kg)  07/29/18 157 lb (71.2 kg)  07/23/18 156 lb (70.8 kg)    Ocular: Sclerae unicteric, pupils equal, round and reactive to light Ear-nose-throat: Oropharynx clear, dentition fair Lymphatic: No cervical, supraclavicular or axillary adenopathy Lungs no rales or rhonchi, good excursion bilaterally Heart regular rate and rhythm, no murmur appreciated Abd soft, nontender, positive bowel sounds, no liver or spleen tip palpated on exam, no fluid wave  MSK no focal spinal tenderness, no joint edema Neuro: non-focal,  well-oriented, appropriate affect Breasts: Deferred   Lab Results  Component Value Date   WBC 4.3 08/13/2018   HGB 11.2 (L) 08/13/2018   HCT 35.8 (L) 08/13/2018   MCV 97.5 08/13/2018   PLT 208 08/13/2018   Lab Results  Component Value Date   FERRITIN 61 07/23/2018   IRON 98 07/23/2018   TIBC 236 07/23/2018   UIBC 138 07/23/2018   IRONPCTSAT 42 07/23/2018   Lab Results  Component Value Date   RBC 3.67 (L) 08/13/2018   Lab Results  Component Value Date   KPAFRELGTCHN 18.9 07/23/2018   LAMBDASER 164.7 (H) 07/23/2018   KAPLAMBRATIO 0.11 (L) 07/23/2018   Lab Results  Component Value Date   IGGSERUM 1,259 07/23/2018   IGA 59 (L) 07/23/2018   IGMSERUM 23 07/23/2018   Lab Results  Component Value Date   TOTALPROTELP 6.1 07/23/2018   ALBUMINELP 3.5 07/23/2018   A1GS 0.2 07/23/2018   A2GS 0.7 07/23/2018   BETS 0.7 07/23/2018   BETA2SER 0.3 07/28/2015   GAMS 1.0 07/23/2018   MSPIKE 0.8 (H) 07/23/2018   SPEI Comment 07/23/2018     Chemistry      Component Value Date/Time   NA 137 08/13/2018 1305   NA 146 (H) 08/07/2017 1153   NA 141 01/09/2017 1004   K 4.7 08/13/2018 1305   K 4.6 08/07/2017 1153   K 4.4 01/09/2017 1004   CL 109 08/13/2018 1305   CL 108 08/07/2017 1153   CO2 23 08/13/2018 1305   CO2 26 08/07/2017 1153   CO2 22 01/09/2017 1004   BUN 30 (H) 08/13/2018 1305   BUN 24 (H) 08/07/2017 1153   BUN 23.9 01/09/2017 1004   CREATININE 1.62 (H) 08/13/2018 1305   CREATININE 1.56 (H) 05/26/2018 1508   CREATININE 1.5 (H) 01/09/2017 1004      Component Value Date/Time   CALCIUM 8.6 (L) 08/13/2018 1305   CALCIUM 8.8 08/07/2017 1153   CALCIUM 8.8 01/09/2017 1004   ALKPHOS 78 08/13/2018 1305   ALKPHOS 97 (H) 08/07/2017 1153   ALKPHOS 80 01/09/2017 1004   AST 23 08/13/2018 1305   AST 18 01/09/2017 1004   ALT 14 08/13/2018 1305   ALT 20 08/07/2017 1153   ALT 12 01/09/2017 1004   BILITOT 0.8 08/13/2018 1305   BILITOT 0.92 01/09/2017 1004        Impression and Plan: Christian Burns is a very pleasant 85 yo caucasian gentleman with relapsed IgG lambda myeloma.  Myeloma studies for today are pending.  We will proceed with treatment today as planned.  We will see what his iron studies show and bring him back in for infusion if needed.  We will see him again in a month for MD follow-up and treatment.  They will contact our office with any questions or concerns. We can certainly see him sooner if need be.   [name], MD 1/2/20202:02 PM

## 2018-08-14 ENCOUNTER — Other Ambulatory Visit: Payer: Self-pay | Admitting: Hematology & Oncology

## 2018-08-14 ENCOUNTER — Telehealth: Payer: Self-pay | Admitting: *Deleted

## 2018-08-14 DIAGNOSIS — C9001 Multiple myeloma in remission: Secondary | ICD-10-CM

## 2018-08-14 DIAGNOSIS — C9002 Multiple myeloma in relapse: Secondary | ICD-10-CM

## 2018-08-14 LAB — PROTEIN ELECTROPHORESIS, SERUM
A/G Ratio: 1.4 (ref 0.7–1.7)
ALPHA-2-GLOBULIN: 0.7 g/dL (ref 0.4–1.0)
Albumin ELP: 3.9 g/dL (ref 2.9–4.4)
Alpha-1-Globulin: 0.2 g/dL (ref 0.0–0.4)
Beta Globulin: 0.8 g/dL (ref 0.7–1.3)
Gamma Globulin: 1.1 g/dL (ref 0.4–1.8)
Globulin, Total: 2.8 g/dL (ref 2.2–3.9)
M-Spike, %: 0.9 g/dL — ABNORMAL HIGH
Total Protein ELP: 6.7 g/dL (ref 6.0–8.5)

## 2018-08-14 LAB — KAPPA/LAMBDA LIGHT CHAINS
Kappa free light chain: 17.6 mg/L (ref 3.3–19.4)
Kappa, lambda light chain ratio: 0.07 — ABNORMAL LOW (ref 0.26–1.65)
Lambda free light chains: 246.9 mg/L — ABNORMAL HIGH (ref 5.7–26.3)

## 2018-08-14 LAB — IGG, IGA, IGM
IGM (IMMUNOGLOBULIN M), SRM: 31 mg/dL (ref 15–143)
IgA: 64 mg/dL (ref 61–437)
IgG (Immunoglobin G), Serum: 1419 mg/dL (ref 700–1600)

## 2018-08-14 LAB — FERRITIN: Ferritin: 39 ng/mL (ref 24–336)

## 2018-08-14 LAB — IRON AND TIBC
Iron: 104 ug/dL (ref 42–163)
SATURATION RATIOS: 38 % (ref 20–55)
TIBC: 274 ug/dL (ref 202–409)
UIBC: 170 ug/dL (ref 117–376)

## 2018-08-14 NOTE — Telephone Encounter (Addendum)
Patient is aware of results, recommendations and further radiology need.   ----- Message from [name], MD sent at 08/14/2018 10:41 AM EST ----- Call - there is a myeloma lesion!!  Need a CT scan to see if the bone is at risk for fracturing!!!  NO lifting more than 10 lbs!!  Christian Burns

## 2018-08-14 NOTE — Progress Notes (Signed)
Hematology and Oncology Follow Up Visit  Christian Burns [id number] 09-14-1929 85 y.o. 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  Unfortunately, I think that his myeloma is starting to progress.  His myeloma studies over the past couple visits have been going up.  His M spike is now 0.8 g/dL.  His IgG level is now 1419 mg/dL.  His lambda light chain is now 25 mg/dL.  He is having some more discomfort in the right hip.  We did get an x-ray of the right hip.  This did show a lytic lesion.  I am going to have to get a CT scan to see if there is any impending fracture.  He has been on the Kyprolis/Cytoxan for quite a while.  I am going to have to add pomalidomide.  I think this would be reasonable.  Our goal for Christian Burns has always been his quality of life.  He wants to be able to maintain a good functional state so we can take care of his wife who is debilitated by arthritis.  We actually have gotten a lot of "mileage" out of the Kyprolis/Cytoxan.  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  He has had no problems with bowels or bladder.  There is been no incontinence.  He has had no leg swelling.  There has been no rashes.  Overall, his performance status is ECOG 1.    Medications:    Allergies as of 08/14/2018      Reactions   Sulfa Antibiotics Itching   Sulfasalazine Itching      Medication List       Accurate as of August 14, 2018  1:18 PM. Always use your most recent med list.        amLODipine-benazepril 5-10 MG capsule Commonly known as:  LOTREL TAKE 1 CAPSULE BY MOUTH DAILY   chlorhexidine 0.12 % solution Commonly known as:  PERIDEX SWISH WITH 1/2 OUNCE FOR 30 SECONDS THEN SPIT BID   finasteride 5 MG tablet Commonly known as:  PROSCAR Take 5 mg by mouth daily.   MULTIVITAL PO Take 1 tablet by mouth every morning.   penicillin v potassium 500 MG tablet Commonly known as:  VEETID Take 1 tablet (500 mg total) by mouth QID.   pomalidomide 2 MG capsule Commonly known as:  POMALYST Take 1 capsule (2 mg total) by mouth daily. Take with water on days 1-21. Repeat every 28 days. [id number]   pyridOXINE 100 MG tablet Commonly known as:  VITAMIN B-6 Take 100 mg by mouth daily.   VITAMIN B 12 PO Take 1,000 mcg by mouth every morning.   Vitamin D (Ergocalciferol) 1.25 MG (50000 UT) Caps capsule Commonly known as:  DRISDOL Take 50,000 Units by mouth every 7 (seven) days. Sundays  Allergies:  Allergies  Allergen Reactions  . Sulfa Antibiotics Itching  . Sulfasalazine Itching    Past Medical History, Surgical history, Social history, and Family History were reviewed and updated.  Review of Systems: All other 10 point review of systems is negative.   Physical Exam:  vitals were not taken for this visit.   Wt Readings from Last 3 Encounters:  08/13/18 154 lb (69.9 kg)  07/29/18 157 lb (71.2 kg)  07/23/18 156 lb (70.8 kg)    Ocular: Sclerae unicteric, pupils equal, round and reactive to light Ear-nose-throat: Oropharynx clear, dentition fair Lymphatic: No cervical, supraclavicular or axillary adenopathy Lungs no rales or rhonchi, good excursion bilaterally Heart regular rate and rhythm, no murmur appreciated Abd soft,  nontender, positive bowel sounds, no liver or spleen tip palpated on exam, no fluid wave  MSK no focal spinal tenderness, no joint edema Neuro: non-focal, well-oriented, appropriate affect Breasts: Deferred   Lab Results  Component Value Date   WBC 4.3 08/13/2018   HGB 11.2 (L) 08/13/2018   HCT 35.8 (L) 08/13/2018   MCV 97.5 08/13/2018   PLT 208 08/13/2018   Lab Results  Component Value Date   FERRITIN 39 08/13/2018   IRON 104 08/13/2018   TIBC 274 08/13/2018   UIBC 170 08/13/2018   IRONPCTSAT 38 08/13/2018   Lab Results  Component Value Date   RBC 3.67 (L) 08/13/2018   Lab Results  Component Value Date   KPAFRELGTCHN 17.6 08/13/2018   LAMBDASER 246.9 (H) 08/13/2018   KAPLAMBRATIO 0.07 (L) 08/13/2018   Lab Results  Component Value Date   IGGSERUM 1,419 08/13/2018   IGA 64 08/13/2018   IGMSERUM 31 08/13/2018   Lab Results  Component Value Date   TOTALPROTELP 6.1 07/23/2018   ALBUMINELP 3.5 07/23/2018   A1GS 0.2 07/23/2018   A2GS 0.7 07/23/2018   BETS 0.7 07/23/2018   BETA2SER 0.3 07/28/2015   GAMS 1.0 07/23/2018   MSPIKE 0.8 (H) 07/23/2018   SPEI Comment 07/23/2018     Chemistry      Component Value Date/Time   NA 137 08/13/2018 1305   NA 146 (H) 08/07/2017 1153   NA 141 01/09/2017 1004   K 4.7 08/13/2018 1305   K 4.6 08/07/2017 1153   K 4.4 01/09/2017 1004   CL 109 08/13/2018 1305   CL 108 08/07/2017 1153   CO2 23 08/13/2018 1305   CO2 26 08/07/2017 1153   CO2 22 01/09/2017 1004   BUN 30 (H) 08/13/2018 1305   BUN 24 (H) 08/07/2017 1153   BUN 23.9 01/09/2017 1004   CREATININE 1.62 (H) 08/13/2018 1305   CREATININE 1.56 (H) 05/26/2018 1508   CREATININE 1.5 (H) 01/09/2017 1004      Component Value Date/Time   CALCIUM 8.6 (L) 08/13/2018 1305   CALCIUM 8.8 08/07/2017 1153   CALCIUM 8.8 01/09/2017 1004   ALKPHOS 78 08/13/2018 1305   ALKPHOS 97 (H) 08/07/2017 1153   ALKPHOS 80 01/09/2017 1004   AST 23 08/13/2018 1305   AST 18 01/09/2017 1004    ALT 14 08/13/2018 1305   ALT 20 08/07/2017 1153   ALT 12 01/09/2017 1004   BILITOT 0.8 08/13/2018 1305   BILITOT 0.92 01/09/2017 1004       Impression and Plan: Christian Burns is a very pleasant 85 yo caucasian gentleman with relapsed IgG lambda myeloma.   Again, I think it is obvious now that his myeloma is progressing.  We will see how the pomalidomide does for him.  I may have to increase the frequency of his treatments with the Kyprolis/Cytoxan.  We will see what the CT scan shows of his right hip/femoral neck.  Hopefully, he will not need surgery.  We might have to think about radiation therapy for that area.  This was a little more complicated than I would have thought when we saw him.  I spent about 40 minutes with he and his grandson.  All the time spent face-to-face.  We counseled him.  I reviewed his lab work with him.  We made the adjustment and added the pomalidomide.  I would like to see him back in 3 weeks, or sooner if there are problems.     [name], MD 1/3/20201:18 PM

## 2018-08-18 ENCOUNTER — Other Ambulatory Visit: Payer: Self-pay | Admitting: Hematology & Oncology

## 2018-08-18 DIAGNOSIS — I10 Essential (primary) hypertension: Secondary | ICD-10-CM

## 2018-08-21 ENCOUNTER — Ambulatory Visit (HOSPITAL_COMMUNITY)
Admission: RE | Admit: 2018-08-21 | Discharge: 2018-08-21 | Disposition: A | Discharge status: Home / Self Care | Payer: Medicare HMO | Source: Ambulatory Visit | Attending: Hematology & Oncology | Admitting: Hematology & Oncology

## 2018-08-21 ENCOUNTER — Other Ambulatory Visit: Payer: Self-pay | Admitting: Hematology & Oncology

## 2018-08-21 ENCOUNTER — Encounter (HOSPITAL_COMMUNITY): Payer: Self-pay

## 2018-08-21 DIAGNOSIS — M25551 Pain in right hip: Secondary | ICD-10-CM | POA: Diagnosis not present

## 2018-08-21 DIAGNOSIS — C9002 Multiple myeloma in relapse: Secondary | ICD-10-CM

## 2018-08-21 MED ORDER — SODIUM CHLORIDE (PF) 0.9 % IJ SOLN
INTRAMUSCULAR | Status: AC
  Filled 2018-08-21: qty 50

## 2018-08-21 MED ORDER — IOHEXOL 300 MG/ML  SOLN
75.0000 mL | Freq: Once | INTRAMUSCULAR | Status: AC | PRN
  Administered 2018-08-21: 75 mL via INTRAVENOUS

## 2018-08-26 ENCOUNTER — Other Ambulatory Visit: Payer: Self-pay | Admitting: *Deleted

## 2018-08-26 ENCOUNTER — Other Ambulatory Visit: Payer: Self-pay | Admitting: Orthopaedic Surgery

## 2018-08-26 DIAGNOSIS — M25551 Pain in right hip: Secondary | ICD-10-CM | POA: Diagnosis not present

## 2018-08-26 DIAGNOSIS — C9001 Multiple myeloma in remission: Secondary | ICD-10-CM

## 2018-08-27 ENCOUNTER — Inpatient Hospital Stay: Payer: Medicare HMO

## 2018-08-27 ENCOUNTER — Other Ambulatory Visit: Payer: Self-pay

## 2018-08-27 ENCOUNTER — Encounter: Payer: Self-pay | Admitting: Hematology & Oncology

## 2018-08-27 ENCOUNTER — Inpatient Hospital Stay (HOSPITAL_BASED_OUTPATIENT_CLINIC_OR_DEPARTMENT_OTHER): Payer: Medicare HMO | Admitting: Hematology & Oncology

## 2018-08-27 VITALS — BP 156/88 | HR 72 | Temp 98.8°F | Resp 18 | Wt 155.0 lb

## 2018-08-27 DIAGNOSIS — C9002 Multiple myeloma in relapse: Secondary | ICD-10-CM

## 2018-08-27 DIAGNOSIS — Z5111 Encounter for antineoplastic chemotherapy: Secondary | ICD-10-CM | POA: Diagnosis not present

## 2018-08-27 DIAGNOSIS — N289 Disorder of kidney and ureter, unspecified: Secondary | ICD-10-CM

## 2018-08-27 DIAGNOSIS — G620 Drug-induced polyneuropathy: Secondary | ICD-10-CM | POA: Diagnosis not present

## 2018-08-27 DIAGNOSIS — D649 Anemia, unspecified: Secondary | ICD-10-CM | POA: Diagnosis not present

## 2018-08-27 DIAGNOSIS — Z419 Encounter for procedure for purposes other than remedying health state, unspecified: Secondary | ICD-10-CM

## 2018-08-27 DIAGNOSIS — M25551 Pain in right hip: Secondary | ICD-10-CM | POA: Diagnosis not present

## 2018-08-27 DIAGNOSIS — Z923 Personal history of irradiation: Secondary | ICD-10-CM | POA: Diagnosis not present

## 2018-08-27 DIAGNOSIS — Z79899 Other long term (current) drug therapy: Secondary | ICD-10-CM | POA: Diagnosis not present

## 2018-08-27 DIAGNOSIS — Z5112 Encounter for antineoplastic immunotherapy: Secondary | ICD-10-CM | POA: Diagnosis not present

## 2018-08-27 DIAGNOSIS — C9001 Multiple myeloma in remission: Secondary | ICD-10-CM

## 2018-08-27 LAB — PROTIME-INR
INR: 1.11
Prothrombin Time: 14.2 seconds (ref 11.4–15.2)

## 2018-08-27 LAB — CMP (CANCER CENTER ONLY)
ALK PHOS: 76 U/L (ref 38–126)
ALT: 12 U/L (ref 0–44)
ANION GAP: 5 (ref 5–15)
AST: 17 U/L (ref 15–41)
Albumin: 3.8 g/dL (ref 3.5–5.0)
BUN: 24 mg/dL — ABNORMAL HIGH (ref 8–23)
CALCIUM: 8.8 mg/dL — AB (ref 8.9–10.3)
CO2: 26 mmol/L (ref 22–32)
Chloride: 107 mmol/L (ref 98–111)
Creatinine: 1.66 mg/dL — ABNORMAL HIGH (ref 0.61–1.24)
GFR, Est AFR Am: 43 mL/min — ABNORMAL LOW (ref >60)
GFR, Estimated: 37 mL/min — ABNORMAL LOW (ref >60)
Glucose, Bld: 120 mg/dL — ABNORMAL HIGH (ref 70–99)
Potassium: 4.7 mmol/L (ref 3.5–5.1)
Sodium: 138 mmol/L (ref 135–145)
TOTAL PROTEIN: 6.8 g/dL (ref 6.5–8.1)
Total Bilirubin: 0.8 mg/dL (ref 0.3–1.2)

## 2018-08-27 LAB — CBC WITH DIFFERENTIAL (CANCER CENTER ONLY)
Abs Immature Granulocytes: 0.03 10*3/uL (ref 0.00–0.07)
BASOS ABS: 0.1 10*3/uL (ref 0.0–0.1)
Basophils Relative: 1 %
EOS PCT: 7 %
Eosinophils Absolute: 0.4 10*3/uL (ref 0.0–0.5)
HCT: 33.1 % — ABNORMAL LOW (ref 39.0–52.0)
Hemoglobin: 11 g/dL — ABNORMAL LOW (ref 13.0–17.0)
Immature Granulocytes: 1 %
Lymphocytes Relative: 5 %
Lymphs Abs: 0.3 10*3/uL — ABNORMAL LOW (ref 0.7–4.0)
MCH: 31.4 pg (ref 26.0–34.0)
MCHC: 33.2 g/dL (ref 30.0–36.0)
MCV: 94.6 fL (ref 80.0–100.0)
Monocytes Absolute: 0.7 10*3/uL (ref 0.1–1.0)
Monocytes Relative: 12 %
NRBC: 0 % (ref 0.0–0.2)
Neutro Abs: 4.2 10*3/uL (ref 1.7–7.7)
Neutrophils Relative %: 74 %
Platelet Count: 197 10*3/uL (ref 150–400)
RBC: 3.5 MIL/uL — ABNORMAL LOW (ref 4.22–5.81)
RDW: 13.8 % (ref 11.5–15.5)
WBC: 5.7 10*3/uL (ref 4.0–10.5)

## 2018-08-27 LAB — APTT: APTT: 29 s (ref 24–36)

## 2018-08-27 MED ORDER — MONTELUKAST SODIUM 10 MG PO TABS: 10.0000 mg | ORAL_TABLET | Freq: Every day | ORAL | 2 refills | Status: DC

## 2018-08-27 NOTE — Progress Notes (Signed)
DISCONTINUE OFF PATHWAY REGIMEN - Multiple Myeloma   [id number]:Carfilzomib 20/27 mg/m2 q28 Days:   A cycle is every 28 days:     Carfilzomib        Dose Mod: None     Carfilzomib        Dose Mod: None  **Always confirm dose/schedule in your pharmacy ordering system**  REASON: Disease Progression PRIOR TREATMENT: Off Pathway: Carfilzomib 20/27 mg/m2 q28 Days TREATMENT RESPONSE: Partial Response (PR)  START ON PATHWAY REGIMEN - Multiple Myeloma and Other Plasma Cell Dyscrasias     A cycle is every 28 days:     Dexamethasone      Dexamethasone      Elotuzumab      Pomalidomide      Dexamethasone      Dexamethasone      Dexamethasone      Elotuzumab   **Always confirm dose/schedule in your pharmacy ordering system**  Patient Characteristics: Relapsed / Refractory, Second through Fourth Lines of Therapy R-ISS Staging: Not Applicable Disease Classification: Relapsed Line of Therapy: Fourth Line Intent of Therapy: Non-Curative / Palliative Intent, Discussed with Patient

## 2018-08-27 NOTE — Progress Notes (Signed)
Hematology and Oncology Follow Up Visit  Christian Burns [id number] 1930/02/14 85 y.o. 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  Clearly, we now have evidence of progressive disease.  When I last saw him, he was complaining of worsening pain in the right hip.  We ultimately got a CT scan on him.  This showed a 3.5 x 2.7 x 2.2 cm lytic lesion in the superior aspect of the right femoral neck.  There was a small cortical defect.  Also noted was a 6.4 x 6.7 x 2.9 cm lytic lesion in the right ilium without a cortical defect.  We got him to Dr. Rhona Raider of [facility].  He will do a hip nailing next Tuesday-October 02, 2018.  Again, we have evidence that he has progressive disease.  I do think that we need to make a change in therapy.  I will switch him over to the monoclonal antibody, elotuzimab with pomalidomide.  We last saw him, his lambda light chain was up to 25 mg/dL.  His IgG level was 1419 mg/dL.  His M spike was 0.9 g/dL.  I do not think that there should be any problems with him having surgery.  He should be in good shape for recovery.  He is not on any type of anticoagulants.  I am going to hold off on starting anything new with him until the end of February.  I think  that we have the luxury of waiting until the end of February until we restart his protocol.  I gave information sheets about the elotuzimab.  Again, his goal of care clearly is quality of life so he can help take care of his debilitated wife who is in a wheelchair secondary to arthritis.  Overall, his performance status is ECOG 1.    Medications:  Allergies as of 08/27/2018      Reactions   Sulfa Antibiotics Itching   Sulfasalazine Itching      Medication List       Accurate as of August 27, 2018  3:10 PM. Always use your most recent med list.        amLODipine-benazepril 5-10 MG capsule Commonly known as:  LOTREL TAKE 1 CAPSULE BY MOUTH DAILY   finasteride 5 MG tablet Commonly known as:  PROSCAR Take 5 mg by mouth daily.   hydroxypropyl methylcellulose / hypromellose 2.5 % ophthalmic solution Commonly known as:  ISOPTO TEARS / GONIOVISC Place 1 drop into both eyes 3 (three) times daily as needed for dry eyes.   multivitamin with minerals Tabs tablet Take 1 tablet by mouth daily.   penicillin v potassium 500 MG tablet Commonly known as:  VEETID Take 1 tablet (500 mg total) by mouth QID.   pomalidomide 2 MG capsule Commonly known as:  POMALYST Take 1 capsule (  2 mg total) by mouth daily. Take with water on days 1-21. Repeat every 28 days. [id number]   PROBIOTIC PO Take 1 capsule by mouth daily.   pyridOXINE 100 MG tablet Commonly known as:  VITAMIN B-6 Take 100 mg by mouth daily.   vitamin B-12 500 MCG tablet Commonly known as:  CYANOCOBALAMIN Take 500 mcg by mouth daily.   Vitamin D (Ergocalciferol) 1.25 MG (50000 UT) Caps capsule Commonly known as:  DRISDOL Take 50,000 Units by mouth every Sunday.       Allergies:  Allergies  Allergen Reactions  . Sulfa Antibiotics Itching  . Sulfasalazine Itching    Past Medical History, Surgical history, Social history, and Family History were reviewed and updated.  Review of Systems: Review of Systems    Constitutional: Negative.   HENT: Negative.   Eyes: Negative.   Respiratory: Negative.   Cardiovascular: Negative.   Gastrointestinal: Negative.   Genitourinary: Negative.   Musculoskeletal: Positive for joint pain.  Skin: Negative.   Neurological: Negative.   Endo/Heme/Allergies: Negative.   Psychiatric/Behavioral: Negative.      Physical Exam:  weight is 155 lb (70.3 kg). His oral temperature is 98.8 F (37.1 C). His blood pressure is 156/88 (abnormal) and his pulse is 72. His respiration is 18 and oxygen saturation is 100%.   Wt Readings from Last 3 Encounters:  08/27/18 155 lb (70.3 kg)  08/13/18 154 lb (69.9 kg)  07/29/18 157 lb (71.2 kg)    Physical Exam Vitals signs reviewed.  HENT:     Head: Normocephalic and atraumatic.  Eyes:     Pupils: Pupils are equal, round, and reactive to light.  Neck:     Musculoskeletal: Normal range of motion.  Cardiovascular:     Rate and Rhythm: Normal rate and regular rhythm.     Heart sounds: Normal heart sounds.  Pulmonary:     Effort: Pulmonary effort is normal.     Breath sounds: Normal breath sounds.  Abdominal:     General: Bowel sounds are normal.     Palpations: Abdomen is soft.  Musculoskeletal: Normal range of motion.        General: No tenderness or deformity.     Comments: Examination of his right hip shows some tenderness to palpation.  He has marked decreased range of motion of the right hip.  There is no swelling.  He does have decent strength in his extremities.  Lymphadenopathy:     Cervical: No cervical adenopathy.  Skin:    General: Skin is warm and dry.     Findings: No erythema or rash.  Neurological:     Mental Status: He is alert and oriented to person, place, and time.  Psychiatric:        Behavior: Behavior normal.        Thought Content: Thought content normal.        Judgment: Judgment normal.       Lab Results  Component Value Date   WBC 5.7 08/27/2018   HGB 11.0 (L) 08/27/2018   HCT  33.1 (L) 08/27/2018   MCV 94.6 08/27/2018   PLT 197 08/27/2018   Lab Results  Component Value Date   FERRITIN 39 08/13/2018   IRON 104 08/13/2018   TIBC 274 08/13/2018   UIBC 170 08/13/2018   IRONPCTSAT 38 08/13/2018   Lab Results  Component Value Date   RBC 3.50 (L) 08/27/2018   Lab Results  Component Value Date   KPAFRELGTCHN 17.6 08/13/2018   LAMBDASER  246.9 (H) 08/13/2018   KAPLAMBRATIO 0.07 (L) 08/13/2018   Lab Results  Component Value Date   IGGSERUM 1,419 08/13/2018   IGA 64 08/13/2018   IGMSERUM 31 08/13/2018   Lab Results  Component Value Date   TOTALPROTELP 6.7 08/13/2018   ALBUMINELP 3.9 08/13/2018   A1GS 0.2 08/13/2018   A2GS 0.7 08/13/2018   BETS 0.8 08/13/2018   BETA2SER 0.3 07/28/2015   GAMS 1.1 08/13/2018   MSPIKE 0.9 (H) 08/13/2018   SPEI Comment 08/13/2018     Chemistry      Component Value Date/Time   NA 138 08/27/2018 1254   NA 146 (H) 08/07/2017 1153   NA 141 01/09/2017 1004   K 4.7 08/27/2018 1254   K 4.6 08/07/2017 1153   K 4.4 01/09/2017 1004   CL 107 08/27/2018 1254   CL 108 08/07/2017 1153   CO2 26 08/27/2018 1254   CO2 26 08/07/2017 1153   CO2 22 01/09/2017 1004   BUN 24 (H) 08/27/2018 1254   BUN 24 (H) 08/07/2017 1153   BUN 23.9 01/09/2017 1004   CREATININE 1.66 (H) 08/27/2018 1254   CREATININE 1.56 (H) 05/26/2018 1508   CREATININE 1.5 (H) 01/09/2017 1004      Component Value Date/Time   CALCIUM 8.8 (L) 08/27/2018 1254   CALCIUM 8.8 08/07/2017 1153   CALCIUM 8.8 01/09/2017 1004   ALKPHOS 76 08/27/2018 1254   ALKPHOS 97 (H) 08/07/2017 1153   ALKPHOS 80 01/09/2017 1004   AST 17 08/27/2018 1254   AST 18 01/09/2017 1004   ALT 12 08/27/2018 1254   ALT 20 08/07/2017 1153   ALT 12 01/09/2017 1004   BILITOT 0.8 08/27/2018 1254   BILITOT 0.92 01/09/2017 1004       Impression and Plan: Mr. Sermon is a very pleasant 85 yo caucasian gentleman with relapsed IgG lambda myeloma.   I spoke to he and his grandson for about 45  minutes.  All the time was spent face-to-face.  I explained the change in protocol.  I think that he would benefit from the elotuzimab/pomalidomide protocol.  I think a monoclonal antibody would be very helpful and should work.  Again, we will plan for therapy to start in about 4 weeks.  I want to make sure that he is healed from his surgery so that he does not have difficulty coming in here.       [name], MD 1/16/20203:10 PM

## 2018-08-31 ENCOUNTER — Other Ambulatory Visit: Payer: Self-pay

## 2018-08-31 ENCOUNTER — Other Ambulatory Visit: Payer: Self-pay | Admitting: Orthopaedic Surgery

## 2018-08-31 ENCOUNTER — Encounter (HOSPITAL_COMMUNITY): Payer: Self-pay | Admitting: *Deleted

## 2018-08-31 NOTE — Anesthesia Preprocedure Evaluation (Addendum)
Anesthesia Evaluation  Patient identified by MRN, date of birth, ID band Patient awake    Reviewed: Allergy & Precautions, NPO status , Patient's Chart, lab work & pertinent test results  History of Anesthesia Complications Negative for: history of anesthetic complications  Airway Mallampati: II  TM Distance: >3 FB Neck ROM: Full    Dental  (+) Dental Advisory Given, Missing,    Pulmonary neg pulmonary ROS,    breath sounds clear to auscultation       Cardiovascular hypertension, Pt. on medications  Rhythm:Regular Rate:Normal     Neuro/Psych negative neurological ROS  negative psych ROS   GI/Hepatic negative GI ROS, Neg liver ROS,   Endo/Other  negative endocrine ROS  Renal/GU CRFRenal disease     Musculoskeletal negative musculoskeletal ROS (+)   Abdominal   Peds  Hematology  (+) anemia ,  Multiple myeloma    Anesthesia Other Findings   Reproductive/Obstetrics                           Anesthesia Physical Anesthesia Plan  ASA: III  Anesthesia Plan: Spinal   Post-op Pain Management:    Induction:   PONV Risk Score and Plan: 1 and Treatment may vary due to age or medical condition and Propofol infusion  Airway Management Planned: Natural Airway and Simple Face Mask  Additional Equipment: None  Intra-op Plan:   Post-operative Plan:   Informed Consent: I have reviewed the patients History and Physical, chart, labs and discussed the procedure including the risks, benefits and alternatives for the proposed anesthesia with the patient or authorized representative who has indicated his/her understanding and acceptance.       Plan Discussed with: CRNA and Anesthesiologist  Anesthesia Plan Comments: (Labs reviewed, platelets acceptable. Discussed risks and benefits of spinal, including spinal/epidural hematoma, infection, failed block, and PDPH. Patient expressed understanding  and wished to proceed. )     Anesthesia Quick Evaluation

## 2018-08-31 NOTE — Progress Notes (Signed)
Spoke with pt for pre-op call. Pt denies cardiac history, chest pain or diabetes. Pt was concerned about no liquids tomorrow since his surgery is scheduled for 5:40 PM. I spoke with Dr. Deatra Canter and he stated pt could have clear liquids until 2 PM. Pt was much appreciative of this. I did tell pt if his surgery is moved earlier, just make sure he doesn't drink anything 3 hours prior to surgery. Pt voiced understanding.

## 2018-08-31 NOTE — H&P (Signed)
Christian Burns is an 85 y.o. male.   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 Marin Olp about his right hip.  He has well-controlled multiple myeloma.  He has developed some hip pain over the last couple of weeks which has relegated him to a walker.  At rest he really does not have much pain and his discomfort is with weightbearing.  He does have a history of radiation for metastasis to his pelvis.  His pain is moderate and getting worse.   Imaging/Tests: I reviewed some plain films through the [facility] system dated 08/08/17 and then some recent films.  On the old films he has an acetabular cyst which looks the same as on the current films.  What is new on the new films compared to the old is a mass in the femoral neck which appears to be eroding the superior cortex of the neck.  Past Medical History:  Diagnosis Date  . History of radiation therapy 06/17/13-07/06/13   35 gray to upper lumbar spine  . 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 Hypertension   . Inguinal hernia   . Multiple myeloma [facility]) Sept 2010  . Multiple myeloma in relapse ([place]) 04/05/2016  . Radiation 09/26/15-10/20/15   lower thoracic spine, upper lumbar spine 30 gray    Past Surgical History:  Procedure Laterality Date  . COLONOSCOPY    . TONSILLECTOMY      History reviewed. No pertinent family history. Social History:  reports that he has never smoked. He has never used smokeless tobacco. He reports current alcohol use of about 4.0 standard drinks of alcohol per week. He reports that he does not use drugs.  Allergies:  Allergies  Allergen Reactions  . Sulfa Antibiotics Itching  . Sulfasalazine Itching    No medications prior to admission.    No results found for this or any previous visit (from the past 48 hour(s)). No results found.  Review of Systems  Musculoskeletal: Positive for joint pain.       Right hip  All other  systems reviewed and are negative.   There were no vitals taken for this visit. Physical Exam  Constitutional: He is oriented to person, place, and time. He appears well-developed and well-nourished.  HENT:  Head: Normocephalic and atraumatic.  Eyes: Pupils are equal, round, and reactive to light.  Neck: Normal range of motion.  Cardiovascular: Normal rate.  Respiratory: Effort normal.  GI: Soft.  Musculoskeletal:     Comments: Right hip motion is full and minimally painful.  The skin is benign across his hip.  Straight leg raise is negative.  Sensation and motor function are intact distally with palpable pulses.  Opposite hip moves fully as well.  Neurological: He is alert and oriented to person, place, and time.  Skin: Skin is warm and dry.  Psychiatric: He has a normal mood and affect. His behavior is normal. Judgment and thought content normal.     Assessment/Plan Assessment: Right femoral neck impending fracture  Plan: Christian Burns is about to fracture his femoral neck.  I think we can help him with a prophylactic pinning.  My plan will be to perform hopefully just a spinal anesthetic and then stabilize this with a trochanteric nail with a screw up into the femoral head.  Hopefully he will be able to get back into some radiation thereafter.  I reviewed risk of anesthesia, infection, DVT.  My hope is that  he will just be in the hospital at [facility] overnight and back to [place] the next day.  He might potentially need to go to their rehab unit but we will see how he does with therapy.  Larwance Sachs Yacine Droz, PA-C 08/31/2018, 6:46 PM

## 2018-08-31 NOTE — Progress Notes (Addendum)
Anesthesia Chart Review: Christian Burns   Case:  [id number] Date/Time:  09/01/18 1725   Procedure:  [place] (Right )   Anesthesia type:  Spinal   Pre-op diagnosis:  RIGHT HIP FEMORAL NECK MASS   Location:  [facility] OR ROOM 07 / [place] [facility]   Surgeon:  Melrose Nakayama, MD      DISCUSSION: Patient is an 85 year old male scheduled for the above procedure. He has progressive disease of recurrent IgG lambda myeloma and recent CT (done for right hip pain) showed 3.5 x 2.7 x 2.2 cm lytic lesion in the superior aspect of the right femoral neck, as well as a 6.2 x 6.7 x 2.9 cm lytic lesion in the right ilium. IM nailing with Dr. Rhona Raider scheduled.   History includes never smoker, multiple myeloma (diagnosed 2010, radiation 2014, 2017; Kyprolis/Cytoxan 2017, 2019-), HTN. History does not list CKD, but Dr. Antonieta Pert recent notes lists anemia of renal insufficiency and chemotherapy. Will request last PCP notes and labs, but labs since 10/2017 have shown a Creatinine range 1.20-2.00, primarily ~ 1.6 range. H/H 11.0/33.1, PLT 197 on 08/27/18.   Per Dr. Antonieta Pert 08/27/18 note, "I do think that we need to make a change in therapy.  I will switch him over to the monoclonal antibody, elotuzimab with pomalidomide. We last saw him, his lambda light chain was up to 25 mg/dL.  His IgG level was 1419 mg/dL.  His M spike was 0.9 g/dL.I do not think that there should be any problems with him having surgery.  He should be in good shape for recovery.  He is not on any type of anticoagulants." He is planning to start new medication regimen next month.   Anesthesiologist to evaluate on the day of surgery.  ADDENDUM 09/01/18 9:13 AM: Records received from Leanna Battles, MD. Patient seen for annual CPE on 07/06/18. Documented CKD stage III that was felt stable at the time--last BUN/Cr there was on 07/09/18 and were 33/1.8 which is consistent with his most recent results in [place]. Will add CKD to his history in Epic.     PROVIDERS: Leanna Battles, MD is PCP  Burney Gauze, MD is HEM-ONC   LABS: Labs from 08/27/18 reviewed. H/H and renal function appear stable.  Lab Results  Component Value Date   WBC 5.7 08/27/2018   HGB 11.0 (L) 08/27/2018   HCT 33.1 (L) 08/27/2018   PLT 197 08/27/2018   GLUCOSE 120 (H) 08/27/2018   ALT 12 08/27/2018   AST 17 08/27/2018   NA 138 08/27/2018   K 4.7 08/27/2018   CL 107 08/27/2018   CREATININE 1.66 (H) 08/27/2018   BUN 24 (H) 08/27/2018   CO2 26 08/27/2018   INR 1.11 08/27/2018    IMAGES: CT right hip 08/21/18: IMPRESSION: Lytic lesions in the right femoral neck and right ilium consistent with multiple myeloma. No pathologic fractures. However, there is a small focal area of cortical disruption of the posterosuperior aspect of the right femoral neck at the site of the lesion, increasing risk for pathologic fracture.   EKG: Last EKG seen 05/01/17. History of HTN, so anticipate repeat on the day of surgery.    CV: N/A   Past Medical History:  Diagnosis Date  . History of radiation therapy 06/17/13-07/06/13   35 gray to upper lumbar spine  . 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 Hypertension   . Inguinal hernia   . Multiple myeloma [facility]) Sept 2010  . Multiple myeloma  in relapse ([place]) 04/05/2016  . Radiation 09/26/15-10/20/15   lower thoracic spine, upper lumbar spine 30 gray    Past Surgical History:  Procedure Laterality Date  . COLONOSCOPY    . TONSILLECTOMY      MEDICATIONS: No current facility-administered medications for this encounter.    Marland Kitchen amLODipine-benazepril (LOTREL) 5-10 MG capsule  . finasteride (PROSCAR) 5 MG tablet  . hydroxypropyl methylcellulose / hypromellose (ISOPTO TEARS / GONIOVISC) 2.5 % ophthalmic solution  . Multiple Vitamin (MULTIVITAMIN WITH MINERALS) TABS tablet  . penicillin v potassium (VEETID) 500 MG tablet  . Probiotic Product (PROBIOTIC PO)  . pyridOXINE (VITAMIN B-6) 100 MG tablet  . vitamin  B-12 (CYANOCOBALAMIN) 500 MCG tablet  . Vitamin D, Ergocalciferol, (DRISDOL) 50000 UNITS CAPS capsule  . montelukast (SINGULAIR) 10 MG tablet  . pomalidomide (POMALYST) 2 MG capsule    Myra Gianotti, PA-C Surgical Short Stay/Anesthesiology [facility] Phone [phone] [facility] Phone [phone] 08/31/2018 4:03 PM

## 2018-09-01 ENCOUNTER — Encounter (HOSPITAL_COMMUNITY): Payer: Self-pay | Admitting: Vascular Surgery

## 2018-09-01 ENCOUNTER — Inpatient Hospital Stay (HOSPITAL_COMMUNITY): Payer: Medicare HMO | Admitting: Vascular Surgery

## 2018-09-01 ENCOUNTER — Inpatient Hospital Stay (HOSPITAL_COMMUNITY): Payer: Medicare HMO

## 2018-09-01 ENCOUNTER — Encounter (HOSPITAL_COMMUNITY)
Admission: RE | Disposition: A | Discharge status: Home Health | Payer: Self-pay | Source: Home / Self Care | Attending: Orthopaedic Surgery

## 2018-09-01 ENCOUNTER — Other Ambulatory Visit: Payer: Self-pay

## 2018-09-01 ENCOUNTER — Inpatient Hospital Stay (HOSPITAL_COMMUNITY)
Admission: RE | Admit: 2018-09-01 | Discharge: 2018-09-02 | DRG: 481 | Disposition: A | Discharge status: Home Health | Payer: Medicare HMO | Attending: Orthopaedic Surgery | Admitting: Orthopaedic Surgery

## 2018-09-01 DIAGNOSIS — Z882 Allergy status to sulfonamides status: Secondary | ICD-10-CM

## 2018-09-01 DIAGNOSIS — Z01818 Encounter for other preprocedural examination: Secondary | ICD-10-CM

## 2018-09-01 DIAGNOSIS — D509 Iron deficiency anemia, unspecified: Secondary | ICD-10-CM | POA: Diagnosis not present

## 2018-09-01 DIAGNOSIS — Z923 Personal history of irradiation: Secondary | ICD-10-CM

## 2018-09-01 DIAGNOSIS — I129 Hypertensive chronic kidney disease with stage 1 through stage 4 chronic kidney disease, or unspecified chronic kidney disease: Secondary | ICD-10-CM | POA: Diagnosis not present

## 2018-09-01 DIAGNOSIS — D630 Anemia in neoplastic disease: Secondary | ICD-10-CM | POA: Diagnosis not present

## 2018-09-01 DIAGNOSIS — C9000 Multiple myeloma not having achieved remission: Secondary | ICD-10-CM | POA: Diagnosis not present

## 2018-09-01 DIAGNOSIS — I10 Essential (primary) hypertension: Secondary | ICD-10-CM | POA: Diagnosis not present

## 2018-09-01 DIAGNOSIS — D631 Anemia in chronic kidney disease: Secondary | ICD-10-CM | POA: Diagnosis present

## 2018-09-01 DIAGNOSIS — Z9889 Other specified postprocedural states: Secondary | ICD-10-CM

## 2018-09-01 DIAGNOSIS — M84451A Pathological fracture, right femur, initial encounter for fracture: Secondary | ICD-10-CM | POA: Diagnosis not present

## 2018-09-01 DIAGNOSIS — M84551A Pathological fracture in neoplastic disease, right femur, initial encounter for fracture: Secondary | ICD-10-CM | POA: Diagnosis not present

## 2018-09-01 DIAGNOSIS — C7951 Secondary malignant neoplasm of bone: Principal | ICD-10-CM | POA: Diagnosis present

## 2018-09-01 DIAGNOSIS — N183 Chronic kidney disease, stage 3 (moderate): Secondary | ICD-10-CM | POA: Diagnosis not present

## 2018-09-01 DIAGNOSIS — L988 Other specified disorders of the skin and subcutaneous tissue: Secondary | ICD-10-CM | POA: Diagnosis not present

## 2018-09-01 DIAGNOSIS — Z419 Encounter for procedure for purposes other than remedying health state, unspecified: Secondary | ICD-10-CM

## 2018-09-01 DIAGNOSIS — M25551 Pain in right hip: Secondary | ICD-10-CM | POA: Diagnosis not present

## 2018-09-01 DIAGNOSIS — S72141D Displaced intertrochanteric fracture of right femur, subsequent encounter for closed fracture with routine healing: Secondary | ICD-10-CM | POA: Diagnosis not present

## 2018-09-01 HISTORY — PX: HIP PINNING,CANNULATED: SHX1758

## 2018-09-01 HISTORY — DX: Chronic kidney disease, unspecified: N18.9

## 2018-09-01 HISTORY — DX: Essential (primary) hypertension: I10

## 2018-09-01 HISTORY — DX: Unilateral inguinal hernia, without obstruction or gangrene, not specified as recurrent: K40.90

## 2018-09-01 LAB — TYPE AND SCREEN
ABO/RH(D): O POS
Antibody Screen: NEGATIVE

## 2018-09-01 LAB — ABO/RH: ABO/RH(D): O POS

## 2018-09-01 LAB — URINALYSIS, ROUTINE W REFLEX MICROSCOPIC
Bilirubin Urine: NEGATIVE
Glucose, UA: NEGATIVE mg/dL
Ketones, ur: NEGATIVE mg/dL
Nitrite: NEGATIVE
Protein, ur: 100 mg/dL — AB
SPECIFIC GRAVITY, URINE: 1.01 (ref 1.005–1.030)
pH: 5 (ref 5.0–8.0)

## 2018-09-01 SURGERY — FIXATION, FEMUR, NECK, PERCUTANEOUS, USING SCREW
Anesthesia: Spinal | Laterality: Right

## 2018-09-01 MED ORDER — CHLORHEXIDINE GLUCONATE 4 % EX LIQD: 60.0000 mL | Freq: Once | CUTANEOUS | Status: DC

## 2018-09-01 MED ORDER — ONDANSETRON HCL 4 MG PO TABS: 4.0000 mg | ORAL_TABLET | Freq: Four times a day (QID) | ORAL | Status: DC | PRN

## 2018-09-01 MED ORDER — LACTATED RINGERS IV SOLN
INTRAVENOUS | Status: DC
  Administered 2018-09-01 (×2): via INTRAVENOUS

## 2018-09-01 MED ORDER — METHOCARBAMOL 500 MG PO TABS
500.0000 mg | ORAL_TABLET | Freq: Four times a day (QID) | ORAL | Status: DC | PRN
  Administered 2018-09-01: 500 mg via ORAL

## 2018-09-01 MED ORDER — ACETAMINOPHEN 325 MG PO TABS: 325.0000 mg | ORAL_TABLET | Freq: Four times a day (QID) | ORAL | Status: DC | PRN

## 2018-09-01 MED ORDER — BENAZEPRIL HCL 5 MG PO TABS
10.0000 mg | ORAL_TABLET | Freq: Every day | ORAL | Status: DC
  Administered 2018-09-02: 10 mg via ORAL
  Filled 2018-09-01: qty 2

## 2018-09-01 MED ORDER — DOCUSATE SODIUM 100 MG PO CAPS
100.0000 mg | ORAL_CAPSULE | Freq: Two times a day (BID) | ORAL | Status: DC
  Administered 2018-09-01 – 2018-09-02 (×2): 100 mg via ORAL
  Filled 2018-09-01 (×2): qty 1

## 2018-09-01 MED ORDER — PROPOFOL 10 MG/ML IV BOLUS
INTRAVENOUS | Status: DC | PRN
  Administered 2018-09-01: 20 mg via INTRAVENOUS

## 2018-09-01 MED ORDER — POLYVINYL ALCOHOL 1.4 % OP SOLN
1.0000 [drp] | Freq: Three times a day (TID) | OPHTHALMIC | Status: DC | PRN
  Filled 2018-09-01: qty 15

## 2018-09-01 MED ORDER — OXYCODONE HCL 5 MG PO TABS
ORAL_TABLET | ORAL | Status: AC
  Filled 2018-09-01: qty 1

## 2018-09-01 MED ORDER — METHOCARBAMOL 1000 MG/10ML IJ SOLN
500.0000 mg | Freq: Four times a day (QID) | INTRAVENOUS | Status: DC | PRN
  Filled 2018-09-01: qty 5

## 2018-09-01 MED ORDER — METOCLOPRAMIDE HCL 5 MG PO TABS: 5.0000 mg | ORAL_TABLET | Freq: Three times a day (TID) | ORAL | Status: DC | PRN

## 2018-09-01 MED ORDER — PHENOL 1.4 % MT LIQD: 1.0000 | OROMUCOSAL | Status: DC | PRN

## 2018-09-01 MED ORDER — PROPOFOL 500 MG/50ML IV EMUL
INTRAVENOUS | Status: DC | PRN
  Administered 2018-09-01: 50 ug/kg/min via INTRAVENOUS

## 2018-09-01 MED ORDER — AMLODIPINE BESYLATE 5 MG PO TABS
5.0000 mg | ORAL_TABLET | Freq: Every day | ORAL | Status: DC
  Filled 2018-09-01: qty 1

## 2018-09-01 MED ORDER — BISACODYL 5 MG PO TBEC: 5.0000 mg | DELAYED_RELEASE_TABLET | Freq: Every day | ORAL | Status: DC | PRN

## 2018-09-01 MED ORDER — METHOCARBAMOL 500 MG PO TABS
ORAL_TABLET | ORAL | Status: AC
  Filled 2018-09-01: qty 1

## 2018-09-01 MED ORDER — 0.9 % SODIUM CHLORIDE (POUR BTL) OPTIME
TOPICAL | Status: DC | PRN
  Administered 2018-09-01: 1000 mL

## 2018-09-01 MED ORDER — LACTATED RINGERS IV SOLN
INTRAVENOUS | Status: DC
  Administered 2018-09-01: 23:00:00 via INTRAVENOUS

## 2018-09-01 MED ORDER — BUPIVACAINE-EPINEPHRINE (PF) 0.25% -1:200000 IJ SOLN
INTRAMUSCULAR | Status: AC
  Filled 2018-09-01: qty 30

## 2018-09-01 MED ORDER — FENTANYL CITRATE (PF) 100 MCG/2ML IJ SOLN: 25.0000 ug | INTRAMUSCULAR | Status: DC | PRN

## 2018-09-01 MED ORDER — ONDANSETRON HCL 4 MG/2ML IJ SOLN
INTRAMUSCULAR | Status: AC
  Filled 2018-09-01: qty 2

## 2018-09-01 MED ORDER — ACETAMINOPHEN 500 MG PO TABS
500.0000 mg | ORAL_TABLET | Freq: Four times a day (QID) | ORAL | Status: DC
  Administered 2018-09-01 – 2018-09-02 (×2): 500 mg via ORAL
  Filled 2018-09-01 (×3): qty 1

## 2018-09-01 MED ORDER — ASPIRIN EC 325 MG PO TBEC
325.0000 mg | DELAYED_RELEASE_TABLET | Freq: Two times a day (BID) | ORAL | Status: DC
  Administered 2018-09-02: 325 mg via ORAL
  Filled 2018-09-01: qty 1

## 2018-09-01 MED ORDER — BUPIVACAINE-EPINEPHRINE 0.25% -1:200000 IJ SOLN
INTRAMUSCULAR | Status: DC | PRN
  Administered 2018-09-01: 10 mL

## 2018-09-01 MED ORDER — AMLODIPINE BESY-BENAZEPRIL HCL 5-10 MG PO CAPS: 1.0000 | ORAL_CAPSULE | Freq: Every day | ORAL | Status: DC

## 2018-09-01 MED ORDER — MONTELUKAST SODIUM 10 MG PO TABS
10.0000 mg | ORAL_TABLET | Freq: Every day | ORAL | Status: DC
  Administered 2018-09-01: 10 mg via ORAL
  Filled 2018-09-01: qty 1

## 2018-09-01 MED ORDER — METOCLOPRAMIDE HCL 5 MG/ML IJ SOLN: 5.0000 mg | Freq: Three times a day (TID) | INTRAMUSCULAR | Status: DC | PRN

## 2018-09-01 MED ORDER — FENTANYL CITRATE (PF) 250 MCG/5ML IJ SOLN
INTRAMUSCULAR | Status: DC | PRN
  Administered 2018-09-01 (×2): 50 ug via INTRAVENOUS

## 2018-09-01 MED ORDER — OXYCODONE HCL 5 MG/5ML PO SOLN: 5.0000 mg | Freq: Once | ORAL | Status: AC | PRN

## 2018-09-01 MED ORDER — CEFAZOLIN SODIUM-DEXTROSE 2-4 GM/100ML-% IV SOLN
2.0000 g | INTRAVENOUS | Status: AC
  Administered 2018-09-01: 2 g via INTRAVENOUS
  Filled 2018-09-01: qty 100

## 2018-09-01 MED ORDER — OXYCODONE HCL 5 MG PO TABS
5.0000 mg | ORAL_TABLET | Freq: Once | ORAL | Status: AC | PRN
  Administered 2018-09-01: 5 mg via ORAL

## 2018-09-01 MED ORDER — MORPHINE SULFATE (PF) 2 MG/ML IV SOLN: 0.5000 mg | INTRAVENOUS | Status: DC | PRN

## 2018-09-01 MED ORDER — BUPIVACAINE IN DEXTROSE 0.75-8.25 % IT SOLN
INTRATHECAL | Status: DC | PRN
  Administered 2018-09-01: 1.4 mL via INTRATHECAL

## 2018-09-01 MED ORDER — PENICILLIN V POTASSIUM 250 MG PO TABS
500.0000 mg | ORAL_TABLET | Freq: Three times a day (TID) | ORAL | Status: DC
  Administered 2018-09-02: 500 mg via ORAL
  Filled 2018-09-01 (×2): qty 2

## 2018-09-01 MED ORDER — ALUM & MAG HYDROXIDE-SIMETH 200-200-20 MG/5ML PO SUSP: 30.0000 mL | ORAL | Status: DC | PRN

## 2018-09-01 MED ORDER — SODIUM CHLORIDE 0.9 % IV SOLN
INTRAVENOUS | Status: DC | PRN
  Administered 2018-09-01: 15 ug/min via INTRAVENOUS

## 2018-09-01 MED ORDER — ONDANSETRON HCL 4 MG/2ML IJ SOLN: 4.0000 mg | Freq: Four times a day (QID) | INTRAMUSCULAR | Status: DC | PRN

## 2018-09-01 MED ORDER — ONDANSETRON HCL 4 MG/2ML IJ SOLN
INTRAMUSCULAR | Status: DC | PRN
  Administered 2018-09-01: 4 mg via INTRAVENOUS

## 2018-09-01 MED ORDER — ONDANSETRON HCL 4 MG/2ML IJ SOLN: 4.0000 mg | Freq: Once | INTRAMUSCULAR | Status: DC | PRN

## 2018-09-01 MED ORDER — FINASTERIDE 5 MG PO TABS
5.0000 mg | ORAL_TABLET | Freq: Every day | ORAL | Status: DC
  Administered 2018-09-02: 5 mg via ORAL
  Filled 2018-09-01: qty 1

## 2018-09-01 MED ORDER — MENTHOL 3 MG MT LOZG: 1.0000 | LOZENGE | OROMUCOSAL | Status: DC | PRN

## 2018-09-01 MED ORDER — FENTANYL CITRATE (PF) 250 MCG/5ML IJ SOLN
INTRAMUSCULAR | Status: AC
  Filled 2018-09-01: qty 5

## 2018-09-01 MED ORDER — HYPROMELLOSE (GONIOSCOPIC) 2.5 % OP SOLN: 1.0000 [drp] | Freq: Three times a day (TID) | OPHTHALMIC | Status: DC | PRN

## 2018-09-01 MED ORDER — HYDROCODONE-ACETAMINOPHEN 5-325 MG PO TABS: 1.0000 | ORAL_TABLET | ORAL | Status: DC | PRN

## 2018-09-01 SURGICAL SUPPLY — 40 items
BIT DRILL 4.3MMS DISTAL GRDTED (BIT) ×1 IMPLANT
CLSR STERI-STRIP ANTIMIC 1/2X4 (GAUZE/BANDAGES/DRESSINGS) ×2 IMPLANT
COVER PERINEAL POST (MISCELLANEOUS) ×2 IMPLANT
COVER SURGICAL LIGHT HANDLE (MISCELLANEOUS) ×2 IMPLANT
COVER WAND RF STERILE (DRAPES) ×2 IMPLANT
DRAPE STERI IOBAN 125X83 (DRAPES) ×2 IMPLANT
DRILL 4.3MMS DISTAL GRADUATED (BIT) ×2
DRILL BIT 7/64X5 (BIT) ×2 IMPLANT
DRSG ADAPTIC 3X8 NADH LF (GAUZE/BANDAGES/DRESSINGS) ×2 IMPLANT
DRSG AQUACEL AG ADV 3.5X 4 (GAUZE/BANDAGES/DRESSINGS) ×2 IMPLANT
DRSG MEPILEX BORDER 4X4 (GAUZE/BANDAGES/DRESSINGS) ×2 IMPLANT
DURAPREP 26ML APPLICATOR (WOUND CARE) ×2 IMPLANT
ELECT REM PT RETURN 9FT ADLT (ELECTROSURGICAL) ×2
ELECTRODE REM PT RTRN 9FT ADLT (ELECTROSURGICAL) ×1 IMPLANT
FACESHIELD WRAPAROUND (MASK) ×4 IMPLANT
GLOVE BIO SURGEON STRL SZ8 (GLOVE) ×8 IMPLANT
GLOVE BIOGEL PI IND STRL 8 (GLOVE) ×1 IMPLANT
GLOVE BIOGEL PI INDICATOR 8 (GLOVE) ×1
GOWN STRL REUS W/ TWL LRG LVL3 (GOWN DISPOSABLE) ×1 IMPLANT
GOWN STRL REUS W/ TWL XL LVL3 (GOWN DISPOSABLE) ×3 IMPLANT
GOWN STRL REUS W/TWL 2XL LVL3 (GOWN DISPOSABLE) ×2 IMPLANT
GOWN STRL REUS W/TWL LRG LVL3 (GOWN DISPOSABLE) ×1
GOWN STRL REUS W/TWL XL LVL3 (GOWN DISPOSABLE) ×3
GUIDEPIN 3.2X17.5 THRD DISP (PIN) ×2 IMPLANT
GUIDEWIRE BALL NOSE 100CM (WIRE) ×2 IMPLANT
KIT TURNOVER KIT B (KITS) ×2 IMPLANT
LINER BOOT UNIVERSAL DISP (MISCELLANEOUS) ×2 IMPLANT
MANIFOLD NEPTUNE II (INSTRUMENTS) ×2 IMPLANT
NAIL HIP FRAC RT 130 11MX400M (Nail) ×2 IMPLANT
NS IRRIG 1000ML POUR BTL (IV SOLUTION) ×2 IMPLANT
PACK GENERAL/GYN (CUSTOM PROCEDURE TRAY) ×2 IMPLANT
PAD ARMBOARD 7.5X6 YLW CONV (MISCELLANEOUS) ×4 IMPLANT
SCREW LAG 10.5MMX105MM HFN (Screw) ×2 IMPLANT
STAPLER VISISTAT 35W (STAPLE) ×2 IMPLANT
SUT VIC AB 1 CT1 27 (SUTURE) ×1
SUT VIC AB 1 CT1 27XBRD ANBCTR (SUTURE) ×1 IMPLANT
SUT VIC AB 2-0 CT1 36 (SUTURE) ×2 IMPLANT
TOWEL OR 17X24 6PK STRL BLUE (TOWEL DISPOSABLE) ×2 IMPLANT
TOWEL OR 17X26 10 PK STRL BLUE (TOWEL DISPOSABLE) ×2 IMPLANT
WATER STERILE IRR 1000ML POUR (IV SOLUTION) ×2 IMPLANT

## 2018-09-01 NOTE — Transfer of Care (Signed)
Immediate Anesthesia Transfer of Care Note  Patient: Christian Burns  Procedure(s) Performed: [place] (Right )  Patient Location: PACU  Anesthesia Type:MAC combined with regional for post-op pain  Level of Consciousness: awake, alert , oriented and patient cooperative  Airway & Oxygen Therapy: Patient Spontanous Breathing and Patient connected to nasal cannula oxygen  Post-op Assessment: Report given to RN and Post -op Vital signs reviewed and stable  Post vital signs: Reviewed and stable  Last Vitals:  Vitals Value Taken Time  BP 134/75 09/01/2018  6:53 PM  Temp    Pulse 70 09/01/2018  6:54 PM  Resp 9 09/01/2018  6:54 PM  SpO2 100 % 09/01/2018  6:54 PM  Vitals shown include unvalidated device data.  Last Pain:  Vitals:   09/01/18 1554  TempSrc:   PainSc: 0-No pain      Patients Stated Pain Goal: 5 (73/71/06 2694)  Complications: No apparent anesthesia complications

## 2018-09-01 NOTE — Anesthesia Procedure Notes (Signed)
Spinal  Patient location during procedure: OR Start time: 09/01/2018 5:20 PM End time: 09/01/2018 5:27 PM Staffing Anesthesiologist: Audry Pili, MD Performed: anesthesiologist  Preanesthetic Checklist Completed: patient identified, surgical consent, pre-op evaluation, timeout performed, IV checked, risks and benefits discussed and monitors and equipment checked Spinal Block Patient position: sitting Prep: DuraPrep Patient monitoring: heart rate, cardiac monitor, continuous pulse ox and blood pressure Approach: midline Location: L2-3 Injection technique: single-shot Needle Needle type: Quincke  Needle gauge: 22 G Additional Notes Consent was obtained prior to the procedure with all questions answered and concerns addressed. Risks including, but not limited to, bleeding, infection, nerve damage, paralysis, failed block, inadequate analgesia, allergic reaction, high spinal, itching, and headache were discussed and the patient wished to proceed. Functioning IV was confirmed and monitors were applied. Sterile prep and drape, including hand hygiene, mask, and sterile gloves were used. The patient was positioned and the spine was prepped. The skin was anesthetized with lidocaine. Free flow of clear CSF was obtained prior to injecting local anesthetic into the CSF. The spinal needle aspirated freely following injection. The needle was carefully withdrawn. The patient tolerated the procedure well.   Renold Don, MD

## 2018-09-01 NOTE — Interval H&P Note (Signed)
History and Physical Interval Note:  09/01/2018 3:32 PM  Christian Burns  has presented today for surgery, with the diagnosis of RIGHT HIP FEMORAL NECK MASS  The various methods of treatment have been discussed with the patient and family. After consideration of risks, benefits and other options for treatment, the patient has consented to  Procedure(s): [place] (Right) as a surgical intervention .  The patient's history has been reviewed, patient examined, no change in status, stable for surgery.  I have reviewed the patient's chart and labs.  Questions were answered to the patient's satisfaction.     Hessie Dibble

## 2018-09-01 NOTE — Op Note (Signed)
NAME: Christian Burns, Christian Burns. MEDICAL RECORD NO:[id number] ACCOUNT NO.:[id number] DATE OF BIRTH:07/22/1930 FACILITY: [facility] LOCATION: [facility] PHYSICIAN:PETER G. DALLDORF, MD  OPERATIVE REPORT  DATE OF PROCEDURE:  09/01/2018  PREOPERATIVE DIAGNOSIS:  Right femoral neck impending fracture.  POSTOPERATIVE DIAGNOSIS:  Right femoral neck impending fracture.  PROCEDURE:  Right femur trochanteric nail placement.  ANESTHESIA:  Spinal and MAC.  ATTENDING SURGEON:  Peter Dalldorf, MD  ASSISTANT:  Andrew Nida, PA.  INDICATIONS:  The patient is an 85-year-old man who has multiple myeloma.  He has developed some right hip pain.  CT is consistent with a femoral neck mass which is of size and position, which makes it inevitable that he is going to break his femoral  neck.  He is offered stabilization in hopes that we can allow him to undergo some radiation therapy and ameliorate the problem.  Informed operative consent was obtained after discussion of possible complications including reaction to anesthesia,  infection, and fracture.   SUMMARY OF FINDINGS AND PROCEDURE:  Under spinal anesthesia through two small incisions, we stabilized the superior aspect of the femoral neck metastasis with an Affixus nail by [employer].  This was 400 x 11 mm in size and we stabilized the femoral  neck with a 10.5 x 105 mm screw, which was locked to the rod.  I used fluoroscopy throughout the case to make appropriate intraoperative decisions and read all these views myself.  Andrew Nida assisted throughout and was invaluable to the completion of  the case, mostly in that he passed instruments to make this possible in a percutaneous fashion.  DESCRIPTION OF PROCEDURE:  The patient was taken to the operating suite where spinal anesthetic was applied without difficulty.  He was positioned on the Hana table appropriately.  All bony prominences were padded.  He was then prepped and draped in  normal sterile fashion.  After the  administration of preoperative IV Kefzol and appropriate timeout, a small incision was made superior to the greater trochanter.  Dissection was carried down to the structure and a guidewire was placed through the medial  aspect of the trochanter into the intramedullary region of the femur.  This was confirmed to be inside the bone on 2 planes of fluoroscopy.  I then overreamed with the starter reamer to about the level of the lesser trochanter.  We then placed another  guidewire down to the knee confirmed to be intramedullary on 2 views.  I then overreamed to a diameter of 13 mm.  We then placed an 11 x 400 Affixus nail seated appropriately.  We then placed the large 10.5 x 105 mm lag screw.  He had excellent purchase  and excellent bone quality.  I used fluoroscopy to confirm adequate placement of hardware on AP and lateral views, both at the femoral neck and down the shaft of the femur.  At this point, the 2 percutaneous incisions were irrigated, followed by  reapproximation of deep tissues with Vicryl and skin with some subcuticular stitches.  Adaptic was applied followed by dry gauze and tape.  Estimated blood loss and intraoperative fluids can be obtained from anesthesia records.  DISPOSITION:  The patient was taken to recovery room in stable condition.  He was to stay at least overnight with probable discharge home in the morning or the next day depending on how he responds to the physical therapy.  TN/NUANCE  D:09/01/2018 Burns:09/01/2018 JOB:[id number] 

## 2018-09-01 NOTE — Brief Op Note (Signed)
Christian Burns [id number] 09/01/2018   PRE-OP DIAGNOSIS: impending femoral neck fracture  POST-OP DIAGNOSIS: same  PROCEDURE: right hip trochanteric nail  ANESTHESIA: spinal and [place]   Dictation #:  [id number]

## 2018-09-02 ENCOUNTER — Encounter (HOSPITAL_COMMUNITY): Payer: Self-pay | Admitting: Orthopaedic Surgery

## 2018-09-02 ENCOUNTER — Ambulatory Visit: Payer: Medicare HMO | Admitting: Internal Medicine

## 2018-09-02 DIAGNOSIS — C9000 Multiple myeloma not having achieved remission: Secondary | ICD-10-CM

## 2018-09-02 DIAGNOSIS — M84551A Pathological fracture in neoplastic disease, right femur, initial encounter for fracture: Secondary | ICD-10-CM

## 2018-09-02 LAB — CBC WITH DIFFERENTIAL/PLATELET
Abs Immature Granulocytes: 0.02 10*3/uL (ref 0.00–0.07)
BASOS PCT: 1 %
Basophils Absolute: 0.1 10*3/uL (ref 0.0–0.1)
Eosinophils Absolute: 0.1 10*3/uL (ref 0.0–0.5)
Eosinophils Relative: 2 %
HCT: 26.6 % — ABNORMAL LOW (ref 39.0–52.0)
Hemoglobin: 8.9 g/dL — ABNORMAL LOW (ref 13.0–17.0)
Immature Granulocytes: 0 %
Lymphocytes Relative: 4 %
Lymphs Abs: 0.2 10*3/uL — ABNORMAL LOW (ref 0.7–4.0)
MCH: 30.9 pg (ref 26.0–34.0)
MCHC: 33.5 g/dL (ref 30.0–36.0)
MCV: 92.4 fL (ref 80.0–100.0)
Monocytes Absolute: 0.8 10*3/uL (ref 0.1–1.0)
Monocytes Relative: 15 %
Neutro Abs: 4.3 10*3/uL (ref 1.7–7.7)
Neutrophils Relative %: 78 %
Platelets: 169 10*3/uL (ref 150–400)
RBC: 2.88 MIL/uL — ABNORMAL LOW (ref 4.22–5.81)
RDW: 13.6 % (ref 11.5–15.5)
WBC: 5.4 10*3/uL (ref 4.0–10.5)
nRBC: 0 % (ref 0.0–0.2)

## 2018-09-02 LAB — COMPREHENSIVE METABOLIC PANEL
ALT: 14 U/L (ref 0–44)
AST: 20 U/L (ref 15–41)
Albumin: 2.7 g/dL — ABNORMAL LOW (ref 3.5–5.0)
Alkaline Phosphatase: 54 U/L (ref 38–126)
Anion gap: 5 (ref 5–15)
BUN: 25 mg/dL — ABNORMAL HIGH (ref 8–23)
CO2: 20 mmol/L — ABNORMAL LOW (ref 22–32)
Calcium: 8.2 mg/dL — ABNORMAL LOW (ref 8.9–10.3)
Chloride: 110 mmol/L (ref 98–111)
Creatinine, Ser: 1.62 mg/dL — ABNORMAL HIGH (ref 0.61–1.24)
GFR calc non Af Amer: 38 mL/min — ABNORMAL LOW (ref >60)
GFR, EST AFRICAN AMERICAN: 44 mL/min — AB (ref >60)
Glucose, Bld: 107 mg/dL — ABNORMAL HIGH (ref 70–99)
Potassium: 4.7 mmol/L (ref 3.5–5.1)
Sodium: 135 mmol/L (ref 135–145)
Total Bilirubin: 1.2 mg/dL (ref 0.3–1.2)
Total Protein: 5.6 g/dL — ABNORMAL LOW (ref 6.5–8.1)

## 2018-09-02 MED ORDER — ASPIRIN 325 MG PO TBEC: 325.0000 mg | DELAYED_RELEASE_TABLET | Freq: Two times a day (BID) | ORAL | 0 refills | Status: DC

## 2018-09-02 NOTE — Progress Notes (Signed)
Pt given discharge instructions and gone over with him and grandson present. Answered all questions. All belongings gathered to be sent home.

## 2018-09-02 NOTE — Consult Note (Signed)
Referral MD  Reason for Referral: Impending pathologic fracture of the right femur secondary to myeloma.  No chief complaint on file. : I just had surgery.  HPI: [name] is well-known to me.  He is an 85 year old white male.  He actually has a long history of IgG lambda myeloma.  He has been on treatment.  He recently has been on Kyprolis/Cytoxan.  He actually has done well with this until recently.  He began to have some pain in the left hip.  It was more difficult for him to walk.  He had scans done which showed a lytic lesion in the femoral neck.  There is no obvious fracture but would I would consider an impending fracture.  Dr. Levora Dredge took him to surgery on 09/01/2018.  A nail was put into the femoral neck to stabilize it.  The surgery went well.  There is no obvious abnormality that was not expected.  This morning, his friend feels okay.  He says there is no pain in the hip.  There is no labs as of yet from this morning.  His last myeloma studies that were done about 3 weeks ago showed an M spike of 0.9 g/dL.  His IgG level was 1419 mg/dL.  The lambda light chain was 24.7 mg/dL.  We are going to have to make a change in his protocol.  He will need radiation therapy.  I will call Dr. Sondra Come of radiation oncology and have him start radiation in a couple of weeks.  Hopefully, he will be going home today.     Past Medical History:  Diagnosis Date  . CKD (chronic kidney disease) 07/06/2018   CKD stage III  . History of radiation therapy 06/17/13-07/06/13   35 gray to upper lumbar spine  . 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 Hypertension   . Inguinal hernia   . Multiple myeloma [facility]) Sept 2010  . Multiple myeloma in relapse (Christian Burns) 04/05/2016  . Radiation 09/26/15-10/20/15   lower thoracic spine, upper lumbar spine 30 gray  :  Past Surgical History:  Procedure Laterality Date  . COLONOSCOPY    . TONSILLECTOMY    :   Current Facility-Administered  Medications:  .  acetaminophen (TYLENOL) tablet 325-650 mg, 325-650 mg, Oral, Q6H PRN, Loni Dolly, PA-C .  acetaminophen (TYLENOL) tablet 500 mg, 500 mg, Oral, Q6H, Nida, Andrew, PA-C, 500 mg at 09/02/18 0647 .  alum & mag hydroxide-simeth (MAALOX/MYLANTA) 200-200-20 MG/5ML suspension 30 mL, 30 mL, Oral, Q4H PRN, Nida, Andrew, PA-C .  amLODipine (NORVASC) tablet 5 mg, 5 mg, Oral, Daily **OR** benazepril (LOTENSIN) tablet 10 mg, 10 mg, Oral, Daily, Melrose Nakayama, MD .  aspirin EC tablet 325 mg, 325 mg, Oral, BID PC, Nida, Andrew, PA-C .  bisacodyl (DULCOLAX) EC tablet 5 mg, 5 mg, Oral, Daily PRN, Loni Dolly, PA-C .  docusate sodium (COLACE) capsule 100 mg, 100 mg, Oral, BID, Loni Dolly, PA-C, 100 mg at 09/01/18 2354 .  finasteride (PROSCAR) tablet 5 mg, 5 mg, Oral, Daily, Loni Dolly, PA-C .  HYDROcodone-acetaminophen (NORCO/VICODIN) 5-325 MG per tablet 1-2 tablet, 1-2 tablet, Oral, Q4H PRN, Loni Dolly, PA-C .  lactated ringers infusion, , Intravenous, Continuous, Macie Burows, Last Rate: 50 mL/hr at 09/01/18 2305 .  menthol-cetylpyridinium (CEPACOL) lozenge 3 mg, 1 lozenge, Oral, PRN **OR** phenol (CHLORASEPTIC) mouth spray 1 spray, 1 spray, Mouth/Throat, PRN, Nida, Andrew, PA-C .  methocarbamol (ROBAXIN) tablet 500 mg, 500 mg, Oral, Q6H PRN, 500 mg at 09/01/18  2050 **OR** methocarbamol (ROBAXIN) 500 mg in dextrose 5 % 50 mL IVPB, 500 mg, Intravenous, Q6H PRN, Nida, Andrew, PA-C .  methocarbamol (ROBAXIN) 500 MG tablet, , , ,  .  metoCLOPramide (REGLAN) tablet 5-10 mg, 5-10 mg, Oral, Q8H PRN **OR** metoCLOPramide (REGLAN) injection 5-10 mg, 5-10 mg, Intravenous, Q8H PRN, Nida, Andrew, PA-C .  montelukast (SINGULAIR) tablet 10 mg, 10 mg, Oral, QHS, Loni Dolly, PA-C, 10 mg at 09/01/18 2354 .  morphine 2 MG/ML injection 0.5-1 mg, 0.5-1 mg, Intravenous, Q2H PRN, Nida, Andrew, PA-C .  ondansetron (ZOFRAN) tablet 4 mg, 4 mg, Oral, Q6H PRN **OR** ondansetron (ZOFRAN) injection 4 mg, 4  mg, Intravenous, Q6H PRN, Nida, Andrew, PA-C .  oxyCODONE (Oxy IR/ROXICODONE) 5 MG immediate release tablet, , , ,  .  penicillin v potassium (VEETID) tablet 500 mg, 500 mg, Oral, TID PC & HS, Nida, Andrew, PA-C .  polyvinyl alcohol (LIQUIFILM TEARS) 1.4 % ophthalmic solution 1 drop, 1 drop, Both Eyes, TID PRN, Melrose Nakayama, MD:  . acetaminophen  500 mg Oral Q6H  . amLODipine  5 mg Oral Daily   Or  . benazepril  10 mg Oral Daily  . aspirin EC  325 mg Oral BID PC  . docusate sodium  100 mg Oral BID  . finasteride  5 mg Oral Daily  . methocarbamol      . montelukast  10 mg Oral QHS  . oxyCODONE      . penicillin v potassium  500 mg Oral TID PC & HS  :  Allergies  Allergen Reactions  . Sulfa Antibiotics Itching  . Sulfasalazine Itching  :  History reviewed. No pertinent family history.:  Social History   Socioeconomic History  . Marital status: Married    Spouse name: Not on file  . Number of children: Not on file  . Years of education: Not on file  . Highest education level: Not on file  Occupational History  . Not on file  Social Needs  . Financial resource strain: Not on file  . Food insecurity:    Worry: Not on file    Inability: Not on file  . Transportation needs:    Medical: Not on file    Non-medical: Not on file  Tobacco Use  . Smoking status: Never Smoker  . Smokeless tobacco: Never Used  . Tobacco comment: never used tobacco  Substance and Sexual Activity  . Alcohol use: Yes    Alcohol/week: 4.0 standard drinks    Types: 4 Glasses of wine per week  . Drug use: No  . Sexual activity: Not on file  Lifestyle  . Physical activity:    Days per week: Not on file    Minutes per session: Not on file  . Stress: Not on file  Relationships  . Social connections:    Talks on phone: Not on file    Gets together: Not on file    Attends religious service: Not on file    Active member of club or organization: Not on file    Attends meetings of clubs or  organizations: Not on file    Relationship status: Not on file  . Intimate partner violence:    Fear of current or ex partner: Not on file    Emotionally abused: Not on file    Physically abused: Not on file    Forced sexual activity: Not on file  Other Topics Concern  . Not on file  Social History Narrative  .  Not on file  :  Pertinent items are noted in HPI.  Exam: As above.  Examination of the right hip shows 2 small dressings.  There is no swelling.  There is no warmth.  There might be some limited range of motion. Patient Vitals for the past 24 hrs:  BP Temp Temp src Pulse Resp SpO2 Height Weight  09/02/18 0337 108/65 98.2 F (36.8 C) Oral 84 16 98 % - -  09/01/18 2230 - - - 69 14 99 % - -  09/01/18 2215 (!) 149/66 - - 73 (!) 21 99 % - -  09/01/18 2200 (!) 162/87 - - 73 17 96 % - -  09/01/18 2145 (!) 168/84 - - 70 20 98 % - -  09/01/18 2130 (!) 156/79 - - 67 13 99 % - -  09/01/18 2115 - - - 69 18 99 % - -  09/01/18 2110 (!) 162/87 - - - - - - -  09/01/18 2015 - - - 68 17 99 % - -  09/01/18 2009 (!) 156/79 - - - - - - -  09/01/18 2000 - - - 72 20 100 % - -  09/01/18 1954 (!) 155/74 - - - - - - -  09/01/18 1945 - - - 70 18 100 % - -  09/01/18 1938 (!) 159/79 - - - - - - -  09/01/18 1930 - - - 80 17 100 % - -  09/01/18 1924 113/80 - - - - - - -  09/01/18 1913 - - - 73 16 100 % - -  09/01/18 1909 135/87 - - - - - - -  09/01/18 1900 134/75 98.3 F (36.8 C) - 74 13 100 % - -  09/01/18 1527 (!) 179/88 98 F (36.7 C) Oral 78 18 100 % 5' 10"  (1.778 m) 150 lb (68 kg)     No results for input(s): WBC, HGB, HCT, PLT in the last 72 hours. No results for input(s): NA, K, CL, CO2, GLUCOSE, BUN, CREATININE, CALCIUM in the last 72 hours.  Blood smear review: None  Pathology: None    Assessment and Plan: Christian Burns is a very nice 85 year old white male.  He has IgG lambda myeloma.  He has progressive disease.  This is now manifested by an impending fracture of the right femoral  neck.  He recently has had this area nailed for stabilization.  He will need radiation therapy.  We will plan for a change in treatment protocol in 6 weeks.  I think that we can hold off on doing any treatment for a while.  He will have radiation therapy in the interim.  I have very much appreciate the outstanding help by Dr. Rhona Raider.  His response to this problem has been incredibly efficient and this will definitely help Mr. Missildine quality of life.  If there is anything that we can do for Mr. Heinicke as an outpatient we will be more than happy to.   Lattie Haw, MD  Jenny Reichmann 5:8

## 2018-09-02 NOTE — Discharge Summary (Signed)
Patient ID: Christian Burns MRN: [id number] DOB/AGE: 1930/07/30 85 y.o.  Admit date: 09/01/2018 Discharge date: 09/02/2018  Admission Diagnoses:  Principal Problem:   Right hip pain   Discharge Diagnoses:  Same  Past Medical History:  Diagnosis Date  . CKD (chronic kidney disease) 07/06/2018   CKD stage III  . History of radiation therapy 06/17/13-07/06/13   35 gray to upper lumbar spine  . 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 Hypertension   . Inguinal hernia   . Multiple myeloma [facility]) Sept 2010  . Multiple myeloma in relapse ([place]) 04/05/2016  . Radiation 09/26/15-10/20/15   lower thoracic spine, upper lumbar spine 30 gray    Surgeries: Procedure(s): [place] on 09/01/2018   Consultants: Treatment Team:  [name], MD  Discharged Condition: Improved  Hospital Course: Christian Burns is an 85 y.o. male who was admitted 09/01/2018 for operative treatment ofRight hip pain. Patient has severe unremitting pain that affects sleep, daily activities, and work/hobbies. After pre-op clearance the patient was taken to the operating room on 09/01/2018 and underwent  Procedure(s): [place].    Patient was given perioperative antibiotics:  Anti-infectives (From admission, onward)   Start     Dose/Rate Route Frequency Ordered Stop   09/02/18 0900  penicillin v potassium (VEETID) tablet 500 mg     500 mg Oral 3 times daily after meals & bedtime 09/01/18 2244     09/02/18 0600  ceFAZolin (ANCEF) IVPB 2g/100 mL premix     2 g 200 mL/hr over 30 Minutes Intravenous On call to O.R. 09/01/18 1512 09/01/18 1728       Patient was given sequential compression devices, early ambulation, and chemoprophylaxis to prevent DVT.  Patient benefited maximally from hospital stay and there were no complications.    Recent vital signs:  Patient Vitals for the past 24 hrs:  BP Temp Temp src Pulse Resp SpO2 Height Weight  09/02/18 0337 108/65 98.2 F (36.8 C)  Oral 84 16 98 % - -  09/01/18 2230 - - - 69 14 99 % - -  09/01/18 2215 (!) 149/66 - - 73 (!) 21 99 % - -  09/01/18 2200 (!) 162/87 - - 73 17 96 % - -  09/01/18 2145 (!) 168/84 - - 70 20 98 % - -  09/01/18 2130 (!) 156/79 - - 67 13 99 % - -  09/01/18 2115 - - - 69 18 99 % - -  09/01/18 2110 (!) 162/87 - - - - - - -  09/01/18 2015 - - - 68 17 99 % - -  09/01/18 2009 (!) 156/79 - - - - - - -  09/01/18 2000 - - - 72 20 100 % - -  09/01/18 1954 (!) 155/74 - - - - - - -  09/01/18 1945 - - - 70 18 100 % - -  09/01/18 1938 (!) 159/79 - - - - - - -  09/01/18 1930 - - - 80 17 100 % - -  09/01/18 1924 113/80 - - - - - - -  09/01/18 1913 - - - 73 16 100 % - -  09/01/18 1909 135/87 - - - - - - -  09/01/18 1900 134/75 98.3 F (36.8 C) - 74 13 100 % - -  09/01/18 1527 (!) 179/88 98 F (36.7 C) Oral 78 18 100 % 5' 10"  (1.778 m) 68 kg     Recent laboratory studies: No  results for input(s): WBC, HGB, HCT, PLT, NA, K, CL, CO2, BUN, CREATININE, GLUCOSE, INR, CALCIUM in the last 72 hours.  Invalid input(s): PT, 2   Discharge Medications:   Allergies as of 09/02/2018      Reactions   Sulfa Antibiotics Itching   Sulfasalazine Itching      Medication List    TAKE these medications   amLODipine-benazepril 5-10 MG capsule Commonly known as:  LOTREL TAKE 1 CAPSULE BY MOUTH DAILY   aspirin 325 MG EC tablet Take 1 tablet (325 mg total) by mouth 2 (two) times daily after a meal.   finasteride 5 MG tablet Commonly known as:  PROSCAR Take 5 mg by mouth daily.   hydroxypropyl methylcellulose / hypromellose 2.5 % ophthalmic solution Commonly known as:  ISOPTO TEARS / GONIOVISC Place 1 drop into both eyes 3 (three) times daily as needed for dry eyes.   montelukast 10 MG tablet Commonly known as:  SINGULAIR Take 1 tablet (10 mg total) by mouth at bedtime.   multivitamin with minerals Tabs tablet Take 1 tablet by mouth daily.   penicillin v potassium 500 MG tablet Commonly known as:   VEETID Take 1 tablet (500 mg total) by mouth QID.   pomalidomide 2 MG capsule Commonly known as:  POMALYST Take 1 capsule (2 mg total) by mouth daily. Take with water on days 1-21. Repeat every 28 days. [id number]   PROBIOTIC PO Take 1 capsule by mouth daily.   pyridOXINE 100 MG tablet Commonly known as:  VITAMIN B-6 Take 100 mg by mouth daily.   vitamin B-12 500 MCG tablet Commonly known as:  CYANOCOBALAMIN Take 500 mcg by mouth daily.   Vitamin D (Ergocalciferol) 1.25 MG (50000 UT) Caps capsule Commonly known as:  DRISDOL Take 50,000 Units by mouth every Sunday.       Diagnostic Studies: Dg Chest 2 View  Result Date: 09/01/2018 CLINICAL DATA:  Preop.  Multiple myeloma. EXAM: CHEST - 2 VIEW COMPARISON:  06/25/2013 FINDINGS: There is a destructive lesion of the lateral aspect of the left ninth rib. There is an associated soft tissue mass measuring 3.3 cm. Lungs are otherwise hyperaerated and clear. Normal heart size. No pneumothorax or pleural effusion. Midthoracic compression deformity is stable. Status post vertebral augmentation at the thoracolumbar junction. IMPRESSION: Destructive lesion of the left ninth rib associated with soft tissue mass. Findings are worrisome for malignancy. CT is recommended Electronically Signed   By: Marybelle Killings M.D.   On: 09/01/2018 15:53   Pelvis Portable  Result Date: 09/01/2018 CLINICAL DATA:  Postop film for right hip nail fixation. EXAM: PORTABLE PELVIS 1-2 VIEWS COMPARISON:  Intraoperative radiographs from earlier on the same day. FINDINGS: Postoperative soft tissue emphysema is noted about the right hip status postop intertrochanteric nail fixation of the right femur. No immediate postoperative complications are identified. The tip of the femoral component is excluded on this study. Joint space narrowing is seen of both hips consistent with osteoarthritis. There is lower lumbar degenerative disc and facet arthropathy. There is mild  osteoarthritis of the SI joints bilaterally as well as pubic symphysis. No acute pelvic fracture is noted. Overlying bowel gas is unremarkable. Pelvic phleboliths are present. IMPRESSION: Right intertrochanteric nail fixation of the hip without complicating features. Electronically Signed   By: Ashley Royalty M.D.   On: 09/01/2018 19:43   Ct Hip Right W Contrast  Result Date: 08/21/2018 CLINICAL DATA:  Right hip pain. Multiple myeloma. Lytic lesion in the right femoral neck. EXAM:  CT OF THE LOWER RIGHT EXTREMITY WITH CONTRAST TECHNIQUE: Multidetector CT imaging of the lower right extremity was performed according to the standard protocol following intravenous contrast administration. COMPARISON:  Radiographs dated 08/13/2018 CONTRAST:  70m OMNIPAQUE IOHEXOL 300 MG/ML  SOLN FINDINGS: Bones/Joint/Cartilage There is a 3.5 x 2.7 x 2.2 cm lytic lesion in the superior aspect of the right femoral neck. There is a small superior cortical defect associated with the lesion. There is also a 6.4 x 6.7 x 2.9 cm lytic lesion in the right ilium extending into the superior aspect of the right acetabulum without a cortical defect. There are multiple smaller lytic lesions in the right ilium and in the sacrum without pathologic fractures. Muscles and Tendons Normal. Soft tissues Normal. IMPRESSION: Lytic lesions in the right femoral neck and right ilium consistent with multiple myeloma. No pathologic fractures. However, there is a small focal area of cortical disruption of the posterosuperior aspect of the right femoral neck at the site of the lesion, increasing risk for pathologic fracture. Electronically Signed   By: JLorriane ShireM.D.   On: 08/21/2018 08:48   Dg C-arm 1-60 Min  Result Date: 09/01/2018 CLINICAL DATA:  85year old male with multiple myeloma and multiple lytic lesions. Subsequent encounter. EXAM: DG C-ARM 61-120 MIN; RIGHT FEMUR 2 VIEWS Fluoroscopic time: 1 minutes and 46 seconds. COMPARISON:  08/21/2018 CT.   08/13/2018 plain film exam. FINDINGS: Four intraoperative C-arm views submitted for review after surgery. This reveals placement of dynamic sliding type screw right femoral neck region with long right femoral intramedullary rod. Portions imaged appear properly placed without surrounding complication. IMPRESSION: Placement of right femoral intramedullary rod with proximal dynamic type screw. The lytic lesions noted on prior imaging are not as well demonstrated on present exam. Electronically Signed   By: SGenia DelM.D.   On: 09/01/2018 18:54   Dg Hip Unilat With Pelvis 2-3 Views Right  Result Date: 08/14/2018 CLINICAL DATA:  Multiple myeloma with worsening pain in the right hip EXAM: DG HIP (WITH OR WITHOUT PELVIS) 2-3V RIGHT COMPARISON:  05/16/2016 FINDINGS: Large lytic lesion in the right superior acetabulum without evidence of fracture or progression since prior. There is also a femoral neck lesion measuring up to 3.2 cm, more distinct than on prior. No evidence of superimposed femoral neck fracture. Osteopenia. IMPRESSION: 1. Large right femoral neck lesion which is more evident and presumably progressed since 2017. No evidence of superimposed fracture but consider MR correlation if this lesion is symptomatic. 2. Stable large right superior acetabular lesion. Electronically Signed   By: JMonte FantasiaM.D.   On: 08/14/2018 08:04   Dg Femur, Min 2 Views Right  Result Date: 09/01/2018 CLINICAL DATA:  85year old male with multiple myeloma and multiple lytic lesions. Subsequent encounter. EXAM: DG C-ARM 61-120 MIN; RIGHT FEMUR 2 VIEWS Fluoroscopic time: 1 minutes and 46 seconds. COMPARISON:  08/21/2018 CT.  08/13/2018 plain film exam. FINDINGS: Four intraoperative C-arm views submitted for review after surgery. This reveals placement of dynamic sliding type screw right femoral neck region with long right femoral intramedullary rod. Portions imaged appear properly placed without surrounding complication.  IMPRESSION: Placement of right femoral intramedullary rod with proximal dynamic type screw. The lytic lesions noted on prior imaging are not as well demonstrated on present exam. Electronically Signed   By: SGenia DelM.D.   On: 09/01/2018 18:54    Disposition: Discharge disposition: 01-Home or Self Care       Discharge Instructions    Call MD /  Call 911   Complete by:  As directed    If you experience chest pain or shortness of breath, CALL 911 and be transported to the hospital emergency room.  If you develope a fever above 101 F, pus (white drainage) or increased drainage or redness at the wound, or calf pain, call your surgeon's office.   Constipation Prevention   Complete by:  As directed    Drink plenty of fluids.  Prune juice may be helpful.  You may use a stool softener, such as Colace (over the counter) 100 mg twice a day.  Use MiraLax (over the counter) for constipation as needed.   Diet - low sodium heart healthy   Complete by:  As directed    Discharge instructions   Complete by:  As directed    Ice, Rest, Weightbearing as tolerated. Keep bandage clean and dry until follow up.  Follow up in office 2 weeks post op.  Continue on ASA 341m twice a day with food for DVT prevention   Increase activity slowly as tolerated   Complete by:  As directed       Follow-up Information    DMelrose Nakayama MD. Schedule an appointment as soon as possible for a visit in 2 weeks.   Specialty:  Orthopedic Surgery Contact information: [phone]           Signed: ALarwance SachsNida 09/02/2018, 7:28 AM

## 2018-09-02 NOTE — Plan of Care (Signed)

## 2018-09-02 NOTE — Care Management Note (Signed)
Case Management Note  Patient Details  Name: Christian Burns MRN: [id number] Date of Birth: [birth year]  Subjective/Objective:  85 yr old gentleman s/p right trochanter nail hip pinning for pathologic femur fracture secondary to myeloma.                  Action/Plan: Case manager spoke with patient concerning discharge plan and DME. Patient resides at [facility] and wants to use their therapy services. CM contacted [facility] with referral, faxed orders and OP note to them: [phone]. Patient will have family support at discharge.   Expected Discharge Date:  09/02/18               Expected Discharge Plan:  [place]  In-House Referral:  NA  Discharge planning Services  CM Consult  Post Acute Care Choice:  Durable Medical Equipment, Home Health Choice offered to:  Patient  DME Arranged:  3-N-1 DME Agency:  NA  HH Arranged:  PT [place] Agency:  Other - See comment([facility])  Status of Service:  Completed, signed off  If discussed at [place] of Stay Meetings, dates discussed:    Additional Comments:  Ninfa Meeker, RN 09/02/2018, 11:38 AM

## 2018-09-02 NOTE — Progress Notes (Signed)
CSW confirmed with [facility] at [place] patient is from their [place].   Patient will return with home health. RNCM notified.   CSW signing off.   [place], [place]

## 2018-09-02 NOTE — Evaluation (Signed)
Physical Therapy Evaluation Patient Details Name: Christian Burns MRN: [id number] DOB: [birth year] Today's Date: 09/02/2018   History of Present Illness  Pt is an 85 y.o. male admitted from [place] on 09/01/18 with impending pathologic fx of R femur secondary to myeloma; now s/p R trochanter nail hip painning 1/21. PMH includes HTN, CKD, multiple myeloma, CKD III.    Clinical Impression  Pt presents with an overall decrease in functional mobility secondary to above. PTA, pt indep and lives with wife at [facility]. Educ on precautions, positioning, therex, and importance of mobility. Today, pt able to ambulate with RW at supervision-level Overall, pt moving very well. Increased time spent discussing home set-up and recommendation for pt to use wife's DME (I.e. shower seat, raised toilet seat) for the time being although he doesn't typically require these; supportive grandson present for education. Pt would benefit from continued acute PT services to maximize functional mobility and independence prior to d/c with HHPT services.     Follow Up Recommendations Home health PT;Supervision for mobility/OOB    Equipment Recommendations  Rolling walker with 5" wheels    Recommendations for Other Services       Precautions / Restrictions Precautions Precautions: Fall Restrictions Weight Bearing Restrictions: Yes RLE Weight Bearing: Weight bearing as tolerated      Mobility  Bed Mobility Overal bed mobility: Modified Independent             General bed mobility comments: Use of BUEs to assist RLE to EOB; increased time and effort  Transfers Overall transfer level: Needs assistance Equipment used: Rolling walker (2 wheeled) Transfers: Sit to/from Stand Sit to Stand: Min guard         General transfer comment: Cues for correct hand placement; min guard standing from low bed height  Ambulation/Gait Ambulation/Gait assistance: Supervision;Min guard Gait Distance (Feet): 120  Feet Assistive device: Rolling walker (2 wheeled) Gait Pattern/deviations: Step-through pattern;Decreased stride length;Decreased weight shift to right;Antalgic Gait velocity: Decreased Gait velocity interpretation: 1.31 - 2.62 ft/sec, indicative of limited community ambulator General Gait Details: Initial min guard for balance, progressing to supervision with RW; pt able to increase WBAT and step-through gait pattern with cues  Stairs            Wheelchair Mobility    Modified Rankin (Stroke Patients Only)       Balance Overall balance assessment: Needs assistance   Sitting balance-Leahy Scale: Good       Standing balance-Leahy Scale: Fair Standing balance comment: Can static stand without UE support                             Pertinent Vitals/Pain Pain Assessment: Faces Faces Pain Scale: Hurts a little bit Pain Location: RLE Pain Descriptors / Indicators: Sore Pain Intervention(s): Limited activity within patient's tolerance;Monitored during session    Home Living Family/patient expects to be discharged to:: Assisted living Living Arrangements: Spouse/significant other             Home Equipment: Walker - 4 wheels;Transport chair;Cane - single point;Shower seat;Grab bars - tub/shower Additional Comments: Lives with wife in apartment at [facility]. Wife ambulatory shorter distances with RW; she is mod indep with ADLs    Prior Function Level of Independence: Independent         Comments: Was indep without DME; past couple days has been using rollator secondary to hip discomfort. Walk to dining hall for meals; typically pt pushes wife in  her transport chair     Hand Dominance        Extremity/Trunk Assessment   Upper Extremity Assessment Upper Extremity Assessment: Overall WFL for tasks assessed    Lower Extremity Assessment Lower Extremity Assessment: RLE deficits/detail RLE Deficits / Details: R knee flex/ext 3/5, hip flex 3/5  through partial range       Communication   Communication: No difficulties  Cognition Arousal/Alertness: Awake/alert Behavior During Therapy: WFL for tasks assessed/performed Overall Cognitive Status: Within Functional Limits for tasks assessed                                        General Comments General comments (skin integrity, edema, etc.): Grandson present at end of session for education    Exercises Other Exercises Other Exercises: Practiced ankle pumps, LAQ, heel slides, seated hip flexion   Assessment/Plan    PT Assessment Patient needs continued PT services  PT Problem List Decreased strength;Decreased activity tolerance;Decreased balance;Decreased mobility;Decreased knowledge of use of DME;Pain       PT Treatment Interventions DME instruction;Gait training;Stair training;Functional mobility training;Therapeutic activities;Therapeutic exercise;Balance training;Patient/family education    PT Goals (Current goals can be found in the Care Plan section)  Acute Rehab PT Goals Patient Stated Goal: Return home today PT Goal Formulation: With patient Time For Goal Achievement: 09/16/18 Potential to Achieve Goals: Good    Frequency Min 5X/week   Barriers to discharge        Co-evaluation               AM-PAC PT "6 Clicks" Mobility  Outcome Measure Help needed turning from your back to your side while in a flat bed without using bedrails?: None Help needed moving from lying on your back to sitting on the side of a flat bed without using bedrails?: None Help needed moving to and from a bed to a chair (including a wheelchair)?: A Little Help needed standing up from a chair using your arms (e.g., wheelchair or bedside chair)?: A Little Help needed to walk in hospital room?: A Little Help needed climbing 3-5 steps with a railing? : A Little 6 Click Score: 20    End of Session Equipment Utilized During Treatment: Gait belt Activity Tolerance:  Patient tolerated treatment well Patient left: in chair;with call bell/phone within reach;with family/visitor present Nurse Communication: Mobility status PT Visit Diagnosis: Other abnormalities of gait and mobility (R26.89)    Time: 6381-7711 PT Time Calculation (min) (ACUTE ONLY): 25 min   Charges:   PT Evaluation $PT Eval Moderate Complexity: 1 Mod PT Treatments $Gait Training: 8-22 mins      Mabeline Caras, PT, DPT Acute Rehabilitation Services  Pager [phone] Office [phone]  Derry Lory 09/02/2018, 8:44 AM

## 2018-09-02 NOTE — Progress Notes (Signed)
Subjective: 1 Day Post-Op Procedure(s) (LRB): TROCH NAIL HIP PINNING (Right)  Patient feels great with no real pain. He is hoping to go home today.  Activity level:  wbat Diet tolerance:  ok Voiding:  ok Patient reports pain as mild.    Objective: Vital signs in last 24 hours: Temp:  [98 F (36.7 C)-98.3 F (36.8 C)] 98.2 F (36.8 C) (01/22 0337) Pulse Rate:  [67-84] 84 (01/22 0337) Resp:  [13-21] 16 (01/22 0337) BP: (108-179)/(65-88) 108/65 (01/22 0337) SpO2:  [96 %-100 %] 98 % (01/22 0337) Weight:  [68 kg] 68 kg (01/21 1527)  Labs: No results for input(s): HGB in the last 72 hours. No results for input(s): WBC, RBC, HCT, PLT in the last 72 hours. No results for input(s): NA, K, CL, CO2, BUN, CREATININE, GLUCOSE, CALCIUM in the last 72 hours. No results for input(s): LABPT, INR in the last 72 hours.  Physical Exam:  Neurologically intact ABD soft Neurovascular intact Sensation intact distally Intact pulses distally Dorsiflexion/Plantar flexion intact Incision: dressing C/D/I and no drainage No cellulitis present Compartment soft  Assessment/Plan:  1 Day Post-Op Procedure(s) (LRB): TROCH NAIL HIP PINNING (Right) Advance diet Up with therapy  Discharge home today after PT. Continue on ASA 325mg  BID for DVT prevention. Follow up in office 2 weeks post op.  Larwance Sachs Aubreana Cornacchia 09/02/2018, 7:23 AM.

## 2018-09-02 NOTE — Plan of Care (Signed)
  Problem: Health Behavior/Discharge Planning: Goal: Ability to manage health-related needs will improve Outcome: Progressing   Problem: Activity: Goal: Risk for activity intolerance will decrease Outcome: Adequate for Discharge   Problem: Elimination: Goal: Will not experience complications related to urinary retention Outcome: Completed/Met   Problem: Pain Managment: Goal: General experience of comfort will improve Outcome: Adequate for Discharge   Problem: Safety: Goal: Ability to remain free from injury will improve Outcome: Adequate for Discharge

## 2018-09-06 NOTE — Anesthesia Postprocedure Evaluation (Signed)
Anesthesia Post Note  Patient: Christian Burns  Procedure(s) Performed: [place] (Right )     Patient location during evaluation: PACU Anesthesia Type: Spinal Level of consciousness: oriented and awake and alert Pain management: pain level controlled Vital Signs Assessment: post-procedure vital signs reviewed and stable Respiratory status: spontaneous breathing, respiratory function stable and patient connected to nasal cannula oxygen Cardiovascular status: blood pressure returned to baseline and stable Postop Assessment: no headache, no backache and no apparent nausea or vomiting Anesthetic complications: no    Last Vitals:  Vitals:   09/01/18 2230 09/02/18 0337  BP:  108/65  Pulse: 69 84  Resp: 14 16  Temp:  36.8 C  SpO2: 99% 98%    Last Pain:  Vitals:   09/02/18 0900  TempSrc:   PainSc: 3                  Annaleigha Woo DAVID

## 2018-09-07 ENCOUNTER — Telehealth: Payer: Self-pay | Admitting: *Deleted

## 2018-09-07 DIAGNOSIS — M25551 Pain in right hip: Secondary | ICD-10-CM | POA: Diagnosis not present

## 2018-09-07 DIAGNOSIS — M6281 Muscle weakness (generalized): Secondary | ICD-10-CM | POA: Diagnosis not present

## 2018-09-07 DIAGNOSIS — R262 Difficulty in walking, not elsewhere classified: Secondary | ICD-10-CM | POA: Diagnosis not present

## 2018-09-07 DIAGNOSIS — R2681 Unsteadiness on feet: Secondary | ICD-10-CM | POA: Diagnosis not present

## 2018-09-07 NOTE — Telephone Encounter (Signed)
Message received from patient requesting a call back.  Call placed back to patient and patient is requesting something for pain from hip pinning last week.  Patient instructed to contact Dr. Rhona Raider regarding pain medicine.  Patient appreciative of call back and will contact Dr. Jerald Kief office regarding pain medicine.

## 2018-09-08 DIAGNOSIS — M6281 Muscle weakness (generalized): Secondary | ICD-10-CM | POA: Diagnosis not present

## 2018-09-08 DIAGNOSIS — R2681 Unsteadiness on feet: Secondary | ICD-10-CM | POA: Diagnosis not present

## 2018-09-08 DIAGNOSIS — R262 Difficulty in walking, not elsewhere classified: Secondary | ICD-10-CM | POA: Diagnosis not present

## 2018-09-08 DIAGNOSIS — M25551 Pain in right hip: Secondary | ICD-10-CM | POA: Diagnosis not present

## 2018-09-09 DIAGNOSIS — R2681 Unsteadiness on feet: Secondary | ICD-10-CM | POA: Diagnosis not present

## 2018-09-09 DIAGNOSIS — M25551 Pain in right hip: Secondary | ICD-10-CM | POA: Diagnosis not present

## 2018-09-09 DIAGNOSIS — M6281 Muscle weakness (generalized): Secondary | ICD-10-CM | POA: Diagnosis not present

## 2018-09-09 DIAGNOSIS — R262 Difficulty in walking, not elsewhere classified: Secondary | ICD-10-CM | POA: Diagnosis not present

## 2018-09-10 DIAGNOSIS — R262 Difficulty in walking, not elsewhere classified: Secondary | ICD-10-CM | POA: Diagnosis not present

## 2018-09-10 DIAGNOSIS — R2681 Unsteadiness on feet: Secondary | ICD-10-CM | POA: Diagnosis not present

## 2018-09-10 DIAGNOSIS — M6281 Muscle weakness (generalized): Secondary | ICD-10-CM | POA: Diagnosis not present

## 2018-09-10 DIAGNOSIS — M25551 Pain in right hip: Secondary | ICD-10-CM | POA: Diagnosis not present

## 2018-09-11 DIAGNOSIS — M6281 Muscle weakness (generalized): Secondary | ICD-10-CM | POA: Diagnosis not present

## 2018-09-11 DIAGNOSIS — R262 Difficulty in walking, not elsewhere classified: Secondary | ICD-10-CM | POA: Diagnosis not present

## 2018-09-11 DIAGNOSIS — R2681 Unsteadiness on feet: Secondary | ICD-10-CM | POA: Diagnosis not present

## 2018-09-11 DIAGNOSIS — M25551 Pain in right hip: Secondary | ICD-10-CM | POA: Diagnosis not present

## 2018-09-14 DIAGNOSIS — M6281 Muscle weakness (generalized): Secondary | ICD-10-CM | POA: Diagnosis not present

## 2018-09-14 DIAGNOSIS — R2681 Unsteadiness on feet: Secondary | ICD-10-CM | POA: Diagnosis not present

## 2018-09-14 DIAGNOSIS — R262 Difficulty in walking, not elsewhere classified: Secondary | ICD-10-CM | POA: Diagnosis not present

## 2018-09-14 DIAGNOSIS — Z4789 Encounter for other orthopedic aftercare: Secondary | ICD-10-CM | POA: Diagnosis not present

## 2018-09-14 DIAGNOSIS — M25551 Pain in right hip: Secondary | ICD-10-CM | POA: Diagnosis not present

## 2018-09-15 DIAGNOSIS — Z9889 Other specified postprocedural states: Secondary | ICD-10-CM | POA: Diagnosis not present

## 2018-09-15 DIAGNOSIS — M84451D Pathological fracture, right femur, subsequent encounter for fracture with routine healing: Secondary | ICD-10-CM | POA: Diagnosis not present

## 2018-09-16 DIAGNOSIS — R2681 Unsteadiness on feet: Secondary | ICD-10-CM | POA: Diagnosis not present

## 2018-09-16 DIAGNOSIS — Z4789 Encounter for other orthopedic aftercare: Secondary | ICD-10-CM | POA: Diagnosis not present

## 2018-09-16 DIAGNOSIS — R262 Difficulty in walking, not elsewhere classified: Secondary | ICD-10-CM | POA: Diagnosis not present

## 2018-09-16 DIAGNOSIS — M6281 Muscle weakness (generalized): Secondary | ICD-10-CM | POA: Diagnosis not present

## 2018-09-16 DIAGNOSIS — M25551 Pain in right hip: Secondary | ICD-10-CM | POA: Diagnosis not present

## 2018-09-17 ENCOUNTER — Ambulatory Visit
Admission: RE | Admit: 2018-09-17 | Discharge: 2018-09-17 | Disposition: A | Discharge status: Home / Self Care | Payer: Medicare HMO | Source: Ambulatory Visit | Attending: Radiation Oncology | Admitting: Radiation Oncology

## 2018-09-17 ENCOUNTER — Ambulatory Visit: Payer: Medicare HMO

## 2018-09-17 DIAGNOSIS — M25551 Pain in right hip: Secondary | ICD-10-CM | POA: Diagnosis not present

## 2018-09-17 DIAGNOSIS — R2681 Unsteadiness on feet: Secondary | ICD-10-CM | POA: Diagnosis not present

## 2018-09-17 DIAGNOSIS — Z4789 Encounter for other orthopedic aftercare: Secondary | ICD-10-CM | POA: Diagnosis not present

## 2018-09-17 DIAGNOSIS — M6281 Muscle weakness (generalized): Secondary | ICD-10-CM | POA: Diagnosis not present

## 2018-09-17 DIAGNOSIS — R262 Difficulty in walking, not elsewhere classified: Secondary | ICD-10-CM | POA: Diagnosis not present

## 2018-09-18 DIAGNOSIS — R262 Difficulty in walking, not elsewhere classified: Secondary | ICD-10-CM | POA: Diagnosis not present

## 2018-09-18 DIAGNOSIS — M25551 Pain in right hip: Secondary | ICD-10-CM | POA: Diagnosis not present

## 2018-09-18 DIAGNOSIS — Z4789 Encounter for other orthopedic aftercare: Secondary | ICD-10-CM | POA: Diagnosis not present

## 2018-09-18 DIAGNOSIS — M6281 Muscle weakness (generalized): Secondary | ICD-10-CM | POA: Diagnosis not present

## 2018-09-18 DIAGNOSIS — R2681 Unsteadiness on feet: Secondary | ICD-10-CM | POA: Diagnosis not present

## 2018-09-19 ENCOUNTER — Other Ambulatory Visit: Payer: Self-pay | Admitting: Hematology & Oncology

## 2018-09-19 DIAGNOSIS — I10 Essential (primary) hypertension: Secondary | ICD-10-CM

## 2018-09-21 DIAGNOSIS — R2681 Unsteadiness on feet: Secondary | ICD-10-CM | POA: Diagnosis not present

## 2018-09-21 DIAGNOSIS — Z4789 Encounter for other orthopedic aftercare: Secondary | ICD-10-CM | POA: Diagnosis not present

## 2018-09-21 DIAGNOSIS — R262 Difficulty in walking, not elsewhere classified: Secondary | ICD-10-CM | POA: Diagnosis not present

## 2018-09-21 DIAGNOSIS — M25551 Pain in right hip: Secondary | ICD-10-CM | POA: Diagnosis not present

## 2018-09-21 DIAGNOSIS — M6281 Muscle weakness (generalized): Secondary | ICD-10-CM | POA: Diagnosis not present

## 2018-09-22 DIAGNOSIS — M6281 Muscle weakness (generalized): Secondary | ICD-10-CM | POA: Diagnosis not present

## 2018-09-22 DIAGNOSIS — M25551 Pain in right hip: Secondary | ICD-10-CM | POA: Diagnosis not present

## 2018-09-22 DIAGNOSIS — Z4789 Encounter for other orthopedic aftercare: Secondary | ICD-10-CM | POA: Diagnosis not present

## 2018-09-22 DIAGNOSIS — R262 Difficulty in walking, not elsewhere classified: Secondary | ICD-10-CM | POA: Diagnosis not present

## 2018-09-22 DIAGNOSIS — R2681 Unsteadiness on feet: Secondary | ICD-10-CM | POA: Diagnosis not present

## 2018-09-23 DIAGNOSIS — R262 Difficulty in walking, not elsewhere classified: Secondary | ICD-10-CM | POA: Diagnosis not present

## 2018-09-23 DIAGNOSIS — R2681 Unsteadiness on feet: Secondary | ICD-10-CM | POA: Diagnosis not present

## 2018-09-23 DIAGNOSIS — Z4789 Encounter for other orthopedic aftercare: Secondary | ICD-10-CM | POA: Diagnosis not present

## 2018-09-23 DIAGNOSIS — M25551 Pain in right hip: Secondary | ICD-10-CM | POA: Diagnosis not present

## 2018-09-23 DIAGNOSIS — M6281 Muscle weakness (generalized): Secondary | ICD-10-CM | POA: Diagnosis not present

## 2018-09-24 DIAGNOSIS — R262 Difficulty in walking, not elsewhere classified: Secondary | ICD-10-CM | POA: Diagnosis not present

## 2018-09-24 DIAGNOSIS — R2681 Unsteadiness on feet: Secondary | ICD-10-CM | POA: Diagnosis not present

## 2018-09-24 DIAGNOSIS — M25551 Pain in right hip: Secondary | ICD-10-CM | POA: Diagnosis not present

## 2018-09-24 DIAGNOSIS — M6281 Muscle weakness (generalized): Secondary | ICD-10-CM | POA: Diagnosis not present

## 2018-09-24 DIAGNOSIS — Z4789 Encounter for other orthopedic aftercare: Secondary | ICD-10-CM | POA: Diagnosis not present

## 2018-09-25 DIAGNOSIS — M25551 Pain in right hip: Secondary | ICD-10-CM | POA: Diagnosis not present

## 2018-09-25 DIAGNOSIS — R262 Difficulty in walking, not elsewhere classified: Secondary | ICD-10-CM | POA: Diagnosis not present

## 2018-09-25 DIAGNOSIS — Z4789 Encounter for other orthopedic aftercare: Secondary | ICD-10-CM | POA: Diagnosis not present

## 2018-09-25 DIAGNOSIS — R2681 Unsteadiness on feet: Secondary | ICD-10-CM | POA: Diagnosis not present

## 2018-09-25 DIAGNOSIS — M6281 Muscle weakness (generalized): Secondary | ICD-10-CM | POA: Diagnosis not present

## 2018-09-28 DIAGNOSIS — R262 Difficulty in walking, not elsewhere classified: Secondary | ICD-10-CM | POA: Diagnosis not present

## 2018-09-28 DIAGNOSIS — M25551 Pain in right hip: Secondary | ICD-10-CM | POA: Diagnosis not present

## 2018-09-28 DIAGNOSIS — R2681 Unsteadiness on feet: Secondary | ICD-10-CM | POA: Diagnosis not present

## 2018-09-28 DIAGNOSIS — M6281 Muscle weakness (generalized): Secondary | ICD-10-CM | POA: Diagnosis not present

## 2018-09-28 DIAGNOSIS — Z4789 Encounter for other orthopedic aftercare: Secondary | ICD-10-CM | POA: Diagnosis not present

## 2018-09-29 DIAGNOSIS — Z4789 Encounter for other orthopedic aftercare: Secondary | ICD-10-CM | POA: Diagnosis not present

## 2018-09-29 DIAGNOSIS — M25551 Pain in right hip: Secondary | ICD-10-CM | POA: Diagnosis not present

## 2018-09-29 DIAGNOSIS — R2681 Unsteadiness on feet: Secondary | ICD-10-CM | POA: Diagnosis not present

## 2018-09-29 DIAGNOSIS — M6281 Muscle weakness (generalized): Secondary | ICD-10-CM | POA: Diagnosis not present

## 2018-09-29 DIAGNOSIS — R262 Difficulty in walking, not elsewhere classified: Secondary | ICD-10-CM | POA: Diagnosis not present

## 2018-09-30 DIAGNOSIS — R2681 Unsteadiness on feet: Secondary | ICD-10-CM | POA: Diagnosis not present

## 2018-09-30 DIAGNOSIS — M84451D Pathological fracture, right femur, subsequent encounter for fracture with routine healing: Secondary | ICD-10-CM | POA: Diagnosis not present

## 2018-09-30 DIAGNOSIS — R262 Difficulty in walking, not elsewhere classified: Secondary | ICD-10-CM | POA: Diagnosis not present

## 2018-09-30 DIAGNOSIS — M25551 Pain in right hip: Secondary | ICD-10-CM | POA: Diagnosis not present

## 2018-09-30 DIAGNOSIS — M6281 Muscle weakness (generalized): Secondary | ICD-10-CM | POA: Diagnosis not present

## 2018-09-30 DIAGNOSIS — Z4789 Encounter for other orthopedic aftercare: Secondary | ICD-10-CM | POA: Diagnosis not present

## 2018-10-01 DIAGNOSIS — M6281 Muscle weakness (generalized): Secondary | ICD-10-CM | POA: Diagnosis not present

## 2018-10-01 DIAGNOSIS — R2681 Unsteadiness on feet: Secondary | ICD-10-CM | POA: Diagnosis not present

## 2018-10-01 DIAGNOSIS — R262 Difficulty in walking, not elsewhere classified: Secondary | ICD-10-CM | POA: Diagnosis not present

## 2018-10-01 DIAGNOSIS — M25551 Pain in right hip: Secondary | ICD-10-CM | POA: Diagnosis not present

## 2018-10-01 DIAGNOSIS — Z4789 Encounter for other orthopedic aftercare: Secondary | ICD-10-CM | POA: Diagnosis not present

## 2018-10-02 DIAGNOSIS — M25551 Pain in right hip: Secondary | ICD-10-CM | POA: Diagnosis not present

## 2018-10-02 DIAGNOSIS — M6281 Muscle weakness (generalized): Secondary | ICD-10-CM | POA: Diagnosis not present

## 2018-10-02 DIAGNOSIS — R262 Difficulty in walking, not elsewhere classified: Secondary | ICD-10-CM | POA: Diagnosis not present

## 2018-10-02 DIAGNOSIS — R2681 Unsteadiness on feet: Secondary | ICD-10-CM | POA: Diagnosis not present

## 2018-10-02 DIAGNOSIS — Z4789 Encounter for other orthopedic aftercare: Secondary | ICD-10-CM | POA: Diagnosis not present

## 2018-10-05 DIAGNOSIS — R2681 Unsteadiness on feet: Secondary | ICD-10-CM | POA: Diagnosis not present

## 2018-10-05 DIAGNOSIS — M25551 Pain in right hip: Secondary | ICD-10-CM | POA: Diagnosis not present

## 2018-10-05 DIAGNOSIS — Z4789 Encounter for other orthopedic aftercare: Secondary | ICD-10-CM | POA: Diagnosis not present

## 2018-10-05 DIAGNOSIS — R262 Difficulty in walking, not elsewhere classified: Secondary | ICD-10-CM | POA: Diagnosis not present

## 2018-10-05 DIAGNOSIS — M6281 Muscle weakness (generalized): Secondary | ICD-10-CM | POA: Diagnosis not present

## 2018-10-06 ENCOUNTER — Encounter (HOSPITAL_COMMUNITY): Payer: Self-pay | Admitting: Emergency Medicine

## 2018-10-06 ENCOUNTER — Inpatient Hospital Stay (HOSPITAL_COMMUNITY)
Admission: EM | Admit: 2018-10-06 | Discharge: 2018-10-10 | DRG: 871 | Disposition: A | Discharge status: Home Health | Payer: Medicare HMO | Source: Skilled Nursing Facility | Attending: Internal Medicine | Admitting: Internal Medicine

## 2018-10-06 ENCOUNTER — Other Ambulatory Visit: Payer: Self-pay

## 2018-10-06 ENCOUNTER — Emergency Department (HOSPITAL_COMMUNITY): Payer: Medicare HMO

## 2018-10-06 DIAGNOSIS — R262 Difficulty in walking, not elsewhere classified: Secondary | ICD-10-CM | POA: Diagnosis not present

## 2018-10-06 DIAGNOSIS — Z882 Allergy status to sulfonamides status: Secondary | ICD-10-CM | POA: Diagnosis not present

## 2018-10-06 DIAGNOSIS — I340 Nonrheumatic mitral (valve) insufficiency: Secondary | ICD-10-CM | POA: Diagnosis not present

## 2018-10-06 DIAGNOSIS — Z79891 Long term (current) use of opiate analgesic: Secondary | ICD-10-CM

## 2018-10-06 DIAGNOSIS — R112 Nausea with vomiting, unspecified: Secondary | ICD-10-CM | POA: Diagnosis not present

## 2018-10-06 DIAGNOSIS — C9002 Multiple myeloma in relapse: Secondary | ICD-10-CM | POA: Diagnosis present

## 2018-10-06 DIAGNOSIS — Z923 Personal history of irradiation: Secondary | ICD-10-CM

## 2018-10-06 DIAGNOSIS — Q211 Atrial septal defect: Secondary | ICD-10-CM | POA: Diagnosis not present

## 2018-10-06 DIAGNOSIS — D630 Anemia in neoplastic disease: Secondary | ICD-10-CM | POA: Diagnosis present

## 2018-10-06 DIAGNOSIS — A419 Sepsis, unspecified organism: Secondary | ICD-10-CM | POA: Diagnosis not present

## 2018-10-06 DIAGNOSIS — I10 Essential (primary) hypertension: Secondary | ICD-10-CM | POA: Diagnosis not present

## 2018-10-06 DIAGNOSIS — Z79899 Other long term (current) drug therapy: Secondary | ICD-10-CM

## 2018-10-06 DIAGNOSIS — I129 Hypertensive chronic kidney disease with stage 1 through stage 4 chronic kidney disease, or unspecified chronic kidney disease: Secondary | ICD-10-CM | POA: Diagnosis present

## 2018-10-06 DIAGNOSIS — N183 Chronic kidney disease, stage 3 (moderate): Secondary | ICD-10-CM | POA: Diagnosis present

## 2018-10-06 DIAGNOSIS — Z4789 Encounter for other orthopedic aftercare: Secondary | ICD-10-CM | POA: Diagnosis not present

## 2018-10-06 DIAGNOSIS — R7881 Bacteremia: Secondary | ICD-10-CM

## 2018-10-06 DIAGNOSIS — R531 Weakness: Secondary | ICD-10-CM | POA: Diagnosis not present

## 2018-10-06 DIAGNOSIS — M25512 Pain in left shoulder: Secondary | ICD-10-CM | POA: Diagnosis not present

## 2018-10-06 DIAGNOSIS — D631 Anemia in chronic kidney disease: Secondary | ICD-10-CM | POA: Diagnosis present

## 2018-10-06 DIAGNOSIS — R918 Other nonspecific abnormal finding of lung field: Secondary | ICD-10-CM | POA: Diagnosis not present

## 2018-10-06 DIAGNOSIS — M6281 Muscle weakness (generalized): Secondary | ICD-10-CM | POA: Diagnosis not present

## 2018-10-06 DIAGNOSIS — J189 Pneumonia, unspecified organism: Secondary | ICD-10-CM | POA: Diagnosis present

## 2018-10-06 DIAGNOSIS — R6520 Severe sepsis without septic shock: Secondary | ICD-10-CM

## 2018-10-06 DIAGNOSIS — R2681 Unsteadiness on feet: Secondary | ICD-10-CM | POA: Diagnosis not present

## 2018-10-06 DIAGNOSIS — R110 Nausea: Secondary | ICD-10-CM | POA: Diagnosis not present

## 2018-10-06 DIAGNOSIS — Z96641 Presence of right artificial hip joint: Secondary | ICD-10-CM

## 2018-10-06 DIAGNOSIS — R079 Chest pain, unspecified: Secondary | ICD-10-CM | POA: Diagnosis not present

## 2018-10-06 DIAGNOSIS — J181 Lobar pneumonia, unspecified organism: Secondary | ICD-10-CM

## 2018-10-06 DIAGNOSIS — Y95 Nosocomial condition: Secondary | ICD-10-CM | POA: Diagnosis present

## 2018-10-06 DIAGNOSIS — C9001 Multiple myeloma in remission: Secondary | ICD-10-CM | POA: Diagnosis not present

## 2018-10-06 DIAGNOSIS — M25551 Pain in right hip: Secondary | ICD-10-CM | POA: Diagnosis not present

## 2018-10-06 DIAGNOSIS — Z7982 Long term (current) use of aspirin: Secondary | ICD-10-CM | POA: Diagnosis not present

## 2018-10-06 DIAGNOSIS — I511 Rupture of chordae tendineae, not elsewhere classified: Secondary | ICD-10-CM | POA: Diagnosis present

## 2018-10-06 DIAGNOSIS — R509 Fever, unspecified: Secondary | ICD-10-CM | POA: Diagnosis not present

## 2018-10-06 LAB — COMPREHENSIVE METABOLIC PANEL
ALK PHOS: 124 U/L (ref 38–126)
ALK PHOS: 102 U/L (ref 38–126)
ALT: 15 U/L (ref 0–44)
ALT: 17 U/L (ref 0–44)
ANION GAP: 6 (ref 5–15)
ANION GAP: 6 (ref 5–15)
AST: 29 U/L (ref 15–41)
AST: 47 U/L — ABNORMAL HIGH (ref 15–41)
Albumin: 3.1 g/dL — ABNORMAL LOW (ref 3.5–5.0)
Albumin: 2.7 g/dL — ABNORMAL LOW (ref 3.5–5.0)
BUN: 36 mg/dL — ABNORMAL HIGH (ref 8–23)
BUN: 31 mg/dL — ABNORMAL HIGH (ref 8–23)
CO2: 18 mmol/L — ABNORMAL LOW (ref 22–32)
CO2: 16 mmol/L — ABNORMAL LOW (ref 22–32)
Calcium: 8.5 mg/dL — ABNORMAL LOW (ref 8.9–10.3)
Calcium: 7.7 mg/dL — ABNORMAL LOW (ref 8.9–10.3)
Chloride: 111 mmol/L (ref 98–111)
Chloride: 115 mmol/L — ABNORMAL HIGH (ref 98–111)
Creatinine, Ser: 1.67 mg/dL — ABNORMAL HIGH (ref 0.61–1.24)
Creatinine, Ser: 1.62 mg/dL — ABNORMAL HIGH (ref 0.61–1.24)
GFR calc Af Amer: 42 mL/min — ABNORMAL LOW (ref >60)
GFR calc Af Amer: 44 mL/min — ABNORMAL LOW (ref >60)
GFR calc non Af Amer: 36 mL/min — ABNORMAL LOW (ref >60)
GFR calc non Af Amer: 38 mL/min — ABNORMAL LOW (ref >60)
Glucose, Bld: 196 mg/dL — ABNORMAL HIGH (ref 70–99)
Glucose, Bld: 221 mg/dL — ABNORMAL HIGH (ref 70–99)
Potassium: 4.9 mmol/L (ref 3.5–5.1)
Potassium: 4.6 mmol/L (ref 3.5–5.1)
Sodium: 135 mmol/L (ref 135–145)
Sodium: 137 mmol/L (ref 135–145)
Total Bilirubin: 0.6 mg/dL (ref 0.3–1.2)
Total Bilirubin: 0.6 mg/dL (ref 0.3–1.2)
Total Protein: 6.8 g/dL (ref 6.5–8.1)
Total Protein: 6.1 g/dL — ABNORMAL LOW (ref 6.5–8.1)

## 2018-10-06 LAB — CBC WITH DIFFERENTIAL/PLATELET
Abs Immature Granulocytes: 0.06 K/uL (ref 0.00–0.07)
Basophils Absolute: 0 K/uL (ref 0.0–0.1)
Basophils Relative: 0 %
Eosinophils Absolute: 0.1 K/uL (ref 0.0–0.5)
Eosinophils Relative: 1 %
HCT: 26.3 % — ABNORMAL LOW (ref 39.0–52.0)
Hemoglobin: 8.5 g/dL — ABNORMAL LOW (ref 13.0–17.0)
Immature Granulocytes: 1 %
Lymphocytes Relative: 2 %
Lymphs Abs: 0.2 K/uL — ABNORMAL LOW (ref 0.7–4.0)
MCH: 31.1 pg (ref 26.0–34.0)
MCHC: 32.3 g/dL (ref 30.0–36.0)
MCV: 96.3 fL (ref 80.0–100.0)
Monocytes Absolute: 0.7 K/uL (ref 0.1–1.0)
Monocytes Relative: 6 %
Neutro Abs: 9.7 K/uL — ABNORMAL HIGH (ref 1.7–7.7)
Neutrophils Relative %: 90 %
Platelets: 198 K/uL (ref 150–400)
RBC: 2.73 MIL/uL — ABNORMAL LOW (ref 4.22–5.81)
RDW: 14.2 % (ref 11.5–15.5)
WBC: 10.8 K/uL — ABNORMAL HIGH (ref 4.0–10.5)
nRBC: 0 % (ref 0.0–0.2)

## 2018-10-06 LAB — URINALYSIS, COMPLETE (UACMP) WITH MICROSCOPIC
Bilirubin Urine: NEGATIVE
Glucose, UA: NEGATIVE mg/dL
Ketones, ur: NEGATIVE mg/dL
Leukocytes,Ua: NEGATIVE
Nitrite: NEGATIVE
Protein, ur: 100 mg/dL — AB
Specific Gravity, Urine: 1.011 (ref 1.005–1.030)
pH: 5 (ref 5.0–8.0)

## 2018-10-06 LAB — CBC
HEMATOCRIT: 24.1 % — AB (ref 39.0–52.0)
Hemoglobin: 7.4 g/dL — ABNORMAL LOW (ref 13.0–17.0)
MCH: 30.5 pg (ref 26.0–34.0)
MCHC: 30.7 g/dL (ref 30.0–36.0)
MCV: 99.2 fL (ref 80.0–100.0)
NRBC: 0 % (ref 0.0–0.2)
Platelets: 174 10*3/uL (ref 150–400)
RBC: 2.43 MIL/uL — ABNORMAL LOW (ref 4.22–5.81)
RDW: 14.1 % (ref 11.5–15.5)
WBC: 12.8 10*3/uL — ABNORMAL HIGH (ref 4.0–10.5)

## 2018-10-06 LAB — PROTIME-INR
INR: 1.1 (ref 0.8–1.2)
Prothrombin Time: 14 s (ref 11.4–15.2)

## 2018-10-06 LAB — BRAIN NATRIURETIC PEPTIDE: B Natriuretic Peptide: 109.5 pg/mL — ABNORMAL HIGH (ref 0.0–100.0)

## 2018-10-06 LAB — LACTIC ACID, PLASMA
Lactic Acid, Venous: 2.1 mmol/L (ref 0.5–1.9)
Lactic Acid, Venous: 1.2 mmol/L (ref 0.5–1.9)

## 2018-10-06 LAB — TROPONIN I: Troponin I: 0.03 ng/mL (ref <0.03)

## 2018-10-06 LAB — INFLUENZA PANEL BY PCR (TYPE A & B)
Influenza A By PCR: NEGATIVE
Influenza B By PCR: NEGATIVE

## 2018-10-06 MED ORDER — SODIUM CHLORIDE 0.9 % IV SOLN
2.0000 g | Freq: Once | INTRAVENOUS | Status: AC
  Administered 2018-10-06: 2 g via INTRAVENOUS
  Filled 2018-10-06: qty 2

## 2018-10-06 MED ORDER — HEPARIN SODIUM (PORCINE) 5000 UNIT/ML IJ SOLN
5000.0000 [IU] | Freq: Three times a day (TID) | INTRAMUSCULAR | Status: DC
  Administered 2018-10-06 – 2018-10-10 (×8): 5000 [IU] via SUBCUTANEOUS
  Filled 2018-10-06 (×8): qty 1

## 2018-10-06 MED ORDER — METRONIDAZOLE IN NACL 5-0.79 MG/ML-% IV SOLN
500.0000 mg | Freq: Three times a day (TID) | INTRAVENOUS | Status: DC
  Administered 2018-10-06 (×2): 500 mg via INTRAVENOUS
  Filled 2018-10-06 (×2): qty 100

## 2018-10-06 MED ORDER — VANCOMYCIN HCL IN DEXTROSE 1-5 GM/200ML-% IV SOLN
1000.0000 mg | Freq: Once | INTRAVENOUS | Status: AC
  Administered 2018-10-06: 1000 mg via INTRAVENOUS
  Filled 2018-10-06: qty 200

## 2018-10-06 MED ORDER — SODIUM CHLORIDE 0.9 % IV SOLN: 1.0000 g | Freq: Three times a day (TID) | INTRAVENOUS | Status: DC

## 2018-10-06 MED ORDER — SODIUM CHLORIDE 0.9 % IV BOLUS (SEPSIS)
1000.0000 mL | Freq: Once | INTRAVENOUS | Status: AC
  Administered 2018-10-06: 1000 mL via INTRAVENOUS

## 2018-10-06 MED ORDER — VANCOMYCIN HCL IN DEXTROSE 1-5 GM/200ML-% IV SOLN: 1000.0000 mg | INTRAVENOUS | Status: DC

## 2018-10-06 MED ORDER — SODIUM CHLORIDE 0.9 % IV SOLN
1.0000 g | INTRAVENOUS | Status: DC
  Administered 2018-10-07: 1 g via INTRAVENOUS
  Filled 2018-10-06 (×2): qty 1

## 2018-10-06 MED ORDER — SODIUM CHLORIDE 0.9% FLUSH
3.0000 mL | Freq: Once | INTRAVENOUS | Status: AC
  Administered 2018-10-06: 3 mL via INTRAVENOUS

## 2018-10-06 MED ORDER — SODIUM CHLORIDE 0.9 % IV SOLN
INTRAVENOUS | Status: DC
  Administered 2018-10-06 – 2018-10-09 (×4): via INTRAVENOUS

## 2018-10-06 NOTE — H&P (Signed)
[facility] History and Physical  Christian Burns MRN:[id number] DOB: 03/14/1930 DOA: 10/06/2018  PCP: Paterson, Daniel, MD   Chief Complaint: Left lower lobe pneumonia  HPI: Christian Burns is a 85 y.o. WM PMHx multiple myeloma treated by Dr. Ennever (oncology):, CKD stage III.  Scheduled to start new treatment on Thursday.  S/p right hip THA 5 weeks ago.  Left shoulder pain negative radiation started today (EKG sinus rhythm) nonreproducible with pressure.  Patient states negative S OB over the last couple days however felt as if he was going to pass out (presyncope?).    Review of Systems:  Constitutional:  No weight loss, night sweats, Fevers, chills, fatigue.  HEENT:  No headaches, Difficulty swallowing,Tooth/dental problems,Sore throat,  No sneezing, itching, ear ache, nasal congestion, post nasal drip,  Cardio-vascular:  No chest pain, Orthopnea, PND, swelling in lower extremities, anasarca, dizziness, palpitations  GI:  No heartburn, indigestion, abdominal pain, nausea, vomiting, diarrhea, change in bowel habits, loss of appetite  Resp:  No shortness of breath with exertion or at rest.  Positive nonproductive cough No change in color of mucus.No wheezing.No chest wall deformity  Skin:  no rash or lesions.  GU:  no dysuria, change in color of urine, no urgency or frequency. No flank pain.  Musculoskeletal:  Positive right hip pain appropriate for s/p THA, positive decreased range of motion. No back pain.  Psych:  No change in mood or affect. No depression or anxiety. No memory loss.   Past Medical History:  Diagnosis Date  . CKD (chronic kidney disease) 07/06/2018   CKD stage III  . History of radiation therapy 06/17/13-07/06/13   35 gray to upper lumbar spine  . 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 Hypertension   . Inguinal hernia   . Multiple myeloma (HCC) Sept 2010  . Multiple myeloma in relapse (HCC) 04/05/2016  . Radiation 09/26/15-10/20/15   lower  thoracic spine, upper lumbar spine 30 gray   Past Surgical History:  Procedure Laterality Date  . COLONOSCOPY    . HIP PINNING,CANNULATED Right 09/01/2018   Procedure: TROCH NAIL HIP PINNING;  Surgeon: Dalldorf, Peter, MD;  Location: [facility] OR;  Service: Orthopedics;  Laterality: Right;  . TONSILLECTOMY     Social History:  reports that he has never smoked. He has never used smokeless tobacco. He reports current alcohol use of about 4.0 standard drinks of alcohol per week. He reports that he does not use drugs.  Allergies  Allergen Reactions  . Sulfa Antibiotics Itching  . Sulfasalazine Itching    Negative family history of hypertension, HLD, COPD, asthma, bleeding dyscrasia    Prior to Admission medications   Medication Sig Start Date End Date Taking? Authorizing Provider  amLODipine-benazepril (LOTREL) 5-10 MG capsule TAKE 1 CAPSULE BY MOUTH DAILY 09/19/18  Yes Ennever, Peter R, MD  aspirin EC 325 MG EC tablet Take 1 tablet (325 mg total) by mouth 2 (two) times daily after a meal. 09/02/18  Yes Nida, Andrew, PA-C  finasteride (PROSCAR) 5 MG tablet Take 5 mg by mouth daily.   Yes [provider]  Multiple Vitamin (MULTIVITAMIN WITH MINERALS) TABS tablet Take 1 tablet by mouth daily.   Yes [provider]  penicillin v potassium (VEETID) 500 MG tablet Take 1 tablet (500 mg total) by mouth QID. 05/26/18  Yes Snider, Cynthia, MD  Probiotic Product (PROBIOTIC PO) Take 1 capsule by mouth daily.   Yes [provider]  pyridOXINE (VITAMIN B-6) 100 MG tablet Take   100 mg by mouth daily.  09/17/13  Yes Ennever, Peter R, MD  traMADol (ULTRAM) 50 MG tablet Take 50 mg by mouth every 8 (eight) hours as needed for pain. 09/07/18  Yes [provider]  vitamin B-12 (CYANOCOBALAMIN) 500 MCG tablet Take 500 mcg by mouth daily.   Yes [provider]  Vitamin D, Ergocalciferol, (DRISDOL) 50000 UNITS CAPS capsule Take 50,000 Units by mouth every Sunday.    Yes [provider]  montelukast (SINGULAIR) 10 MG tablet Take 1 tablet (10 mg total) by mouth at bedtime. 08/27/18   Ennever, Peter R, MD  pomalidomide (POMALYST) 2 MG capsule Take 1 capsule (2 mg total) by mouth daily. Take with water on days 1-21. Repeat every 28 days. Auth#[id number] 08/13/18   Ennever, Peter R, MD     Consultants:  None  Procedures/Significant Events:    I have personally reviewed and interpreted all radiology studies and my findings are as above.   VENTILATOR SETTINGS:    Cultures 2/25 influenza A/B negative 2/25 urine pending 2/25 blood pending 2/25 sputum pending 2/25 strep pneumo/Legionella urine pending    Antimicrobials: Anti-infectives (From admission, onward)   Start     Ordered Stop   10/07/18 2359  vancomycin (VANCOCIN) IVPB 1000 mg/200 mL premix     10/06/18 2158     10/07/18 1700  ceFEPIme (MAXIPIME) 1 g in sodium chloride 0.9 % 100 mL IVPB     10/06/18 2159 10/15/18 1659   10/06/18 2200  ceFEPIme (MAXIPIME) 1 g in sodium chloride 0.9 % 100 mL IVPB  Status:  Discontinued     10/06/18 2129 10/06/18 2159   10/06/18 1600  ceFEPIme (MAXIPIME) 2 g in sodium chloride 0.9 % 100 mL IVPB     10/06/18 1551 10/06/18 1708   10/06/18 1600  metroNIDAZOLE (FLAGYL) IVPB 500 mg  Status:  Discontinued     10/06/18 1551 10/07/18 0432   10/06/18 1600  vancomycin (VANCOCIN) IVPB 1000 mg/200 mL premix     10/06/18 1551 10/06/18 2103        Devices None     LINES / TUBES:      Continuous Infusions: . sodium chloride 75 mL/hr at 10/06/18 2149  . ceFEPime (MAXIPIME) IV    . metronidazole 500 mg (10/06/18 2323)  . vancomycin      Physical Exam: Vitals:   10/06/18 2151 10/06/18 2200 10/06/18 2251 10/07/18 0409  BP:  130/70 125/64 131/69  Pulse:  91 85 95  Resp:  20 16 16  Temp: 98.1 F (36.7 C)  98.2 F (36.8 C) 98.5 F (36.9 C)  TempSrc: Oral     SpO2:  97% 99% 97%  Weight:      Height:        Wt Readings from Last 3 Encounters:  10/06/18  64.9 kg  09/01/18 68 kg  08/27/18 70.3 kg    General: A/O x4, no acute respiratory distress Eyes: negative scleral hemorrhage, negative anisocoria, negative icterus ENT: Negative Runny nose, negative gingival bleeding, Neck:  Negative scars, masses, torticollis, lymphadenopathy, JVD Lungs: Clear to auscultation bilaterally without wheezes or crackles Cardiovascular: Regular rate and rhythm without murmur gallop or rub normal S1 and S2, left shoulder pain nonreproducible with palpation Abdomen: negative abdominal pain, nondistended, positive soft, bowel sounds, no rebound, no ascites, no appreciable mass Extremities: No significant cyanosis, clubbing, or edema bilateral lower extremities Skin: Negative rashes, lesions, ulcers Psychiatric:  Negative depression, negative anxiety, negative fatigue, negative mania  Central nervous   system:  Cranial nerves II through XII intact, tongue/uvula midline, all extremities muscle strength 2/5, sensation intact throughout,  negative dysarthria, negative expressive aphasia, negative receptive aphasia.        Labs on Admission:  Basic Metabolic Panel: Recent Labs  Lab 10/06/18 1625 10/06/18 2142 10/07/18 0319  NA 135 137 139  K 4.9 4.6 4.3  CL 111 115* 118*  CO2 18* 16* 18*  GLUCOSE 196* 221* 109*  BUN 36* 31* 29*  CREATININE 1.67* 1.62* 1.44*  CALCIUM 8.5* 7.7* 7.8*   Liver Function Tests: Recent Labs  Lab 10/06/18 1625 10/06/18 2142 10/07/18 0319  AST 29 47* 50*  ALT 15 17 18  ALKPHOS 124 102 100  BILITOT 0.6 0.6 0.7  PROT 6.8 6.1* 6.0*  ALBUMIN 3.1* 2.7* 2.6*   No results for input(s): LIPASE, AMYLASE in the last 168 hours. No results for input(s): AMMONIA in the last 168 hours. CBC: Recent Labs  Lab 10/06/18 1625 10/06/18 2142 10/07/18 0319  WBC 10.8* 12.8* 11.9*  NEUTROABS 9.7*  --  11.7*  HGB 8.5* 7.4* 7.3*  HCT 26.3* 24.1* 22.8*  MCV 96.3 99.2 98.7  PLT 198 174 161   Cardiac Enzymes: Recent Labs  Lab  10/06/18 2142 10/07/18 0319  TROPONINI 0.03* <0.03    BNP (last 3 results) Recent Labs    10/06/18 2142  BNP 109.5*    ProBNP (last 3 results) No results for input(s): PROBNP in the last 8760 hours.  CBG: No results for input(s): GLUCAP in the last 168 hours.  Radiological Exams on Admission: Dg Chest 2 View  Result Date: 10/06/2018 CLINICAL DATA:  85-year-old male with a history of fever and chills EXAM: CHEST - 2 VIEW COMPARISON:  09/01/2018 FINDINGS: Cardiomediastinal silhouette unchanged in size and contour. No pneumothorax. No pleural effusion. Reticular opacities at the left lung base, new from the prior with partial obscuration left hemidiaphragm. No pleural effusion. Destructive soft tissue lesion of the left chest wall involving ninth rib, similar to the prior. IMPRESSION: Reticular opacity at the left lung base, concerning for pneumonia given the history of fever. Redemonstration of destructive left ninth rib lesion. Correlation with contrast-enhanced CT may be useful, as the findings are compatible with malignancy. Electronically Signed   By: Jaime  Wagner D.O.   On: 10/06/2018 17:20    EKG: Sinus rhythm with PACs   Assessment/Plan Active Problems:   Multiple myeloma in relapse (HCC)   Right hip pain   Left lower lobe pneumonia (HCC)   History of total right hip replacement   Sepsis -Meets criteria for sepsis.  Patient is WBC normally 5.4-5.7 now increased to 12.8 on admission.,  T-max> 38 C - Patient is immunocompromised secondary to multiple myeloma will treat as HCAP -Continue current antibiotic x7 days - DuoNeb as needed as patient does not appear S OB at this time. - Hold on starting steroids  Left lower lobe pneumonia -See sepsis  Multiple myeloma in relapse Per patient and son patient was to start new medication on Thursday by Dr. Ennever.  Contact Dr. Ennever (oncology) in the a.m. and inform him patient has been admitted to the hospital  CKD stage  III -Monitor creatinine closely -Baseline creatinine approximately 1.65 -  Recent Labs  Lab 10/06/18 1625 10/06/18 2142 10/07/18 0319  CREATININE 1.67* 1.62* 1.44*    Left shoulder pain/chest pain? -Pain is new onset nonreproducible with palpation, borderline EKG - Troponin pending - Echocardiogram pending  S/p right hip THA - Appears stable.    Patient uses walker at home. -Consult PT/OT in A.m.    Code Status: Full (DVT Prophylaxis: Heparin subcu Family Communication: Son at bedside Disposition Plan: TBD    Data Reviewed: Care during the described time interval was provided by me .  I have reviewed this patient's available data, including medical history, events of note, physical examination, and all test results as part of my evaluation.   Time spent: 70 min  WOODS, CURTIS J [facility] Pager [phone]  

## 2018-10-06 NOTE — ED Notes (Signed)
ED TO INPATIENT HANDOFF REPORT  Name/Age/Gender Christian Burns 85 y.o. male  Code Status    Code Status Orders  (From admission, onward)         Start     Ordered   10/06/18 2125  Full code  Continuous     10/06/18 2129        Code Status History    Date Active Date Inactive Code Status Order ID Comments User Context   09/01/2018 2244 09/02/2018 1446 Full Code [id number]  Loni Dolly, PA-C Inpatient    Advance Directive Documentation     Most Recent Value  Type of Advance Directive  Healthcare Power of Attorney, Living will  Pre-existing out of facility DNR order (yellow form or pink MOST form)  -  "MOST" Form in Place?  -      Home/SNF/Other Home  Chief Complaint Fever/Chills  Level of Care/Admitting Diagnosis ED Disposition    ED Disposition Condition [place]: [place] [[id number]]  Level of Care: Telemetry [5]  Admit to tele based on following criteria: Monitor for Ischemic changes  Diagnosis: Left lower lobe pneumonia [facility]) [[id number]]  Admitting Physician: Allie Bossier [[id number]]  Attending Physician: Allie Bossier [[id number]]  Estimated length of stay: 3 - 4 days  Certification:: I certify this patient will need inpatient services for at least 2 midnights  PT Class (Do Not Modify): Inpatient [101]  PT Acc Code (Do Not Modify): Private [1]       Medical History Past Medical History:  Diagnosis Date  . CKD (chronic kidney disease) 07/06/2018   CKD stage III  . History of radiation therapy 06/17/13-07/06/13   35 gray to upper lumbar spine  . 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 Hypertension   . Inguinal hernia   . Multiple myeloma [facility]) Sept 2010  . Multiple myeloma in relapse ([place]) 04/05/2016  . Radiation 09/26/15-10/20/15   lower thoracic spine, upper lumbar spine 30 gray    Allergies Allergies  Allergen Reactions  . Sulfa Antibiotics Itching  . Sulfasalazine Itching    IV  Location/Drains/Wounds Patient Lines/Drains/Airways Status   Active Line/Drains/Airways    Name:   Placement date:   Placement time:   Site:   Days:   Peripheral IV   -    -    -      Peripheral IV 10/06/18 Right;Upper Forearm   10/06/18    1631    Forearm   less than 1   Peripheral IV 10/06/18 Right Forearm   10/06/18    1632    Forearm   less than 1   Incision (Closed) 09/01/18 Hip Right   09/01/18    1840     35          Labs/Imaging Results for orders placed or performed during the hospital encounter of 10/06/18 (from the past 48 hour(s))  Comprehensive metabolic panel     Status: Abnormal   Collection Time: 10/06/18  4:25 PM  Result Value Ref Range   Sodium 135 135 - 145 mmol/L   Potassium 4.9 3.5 - 5.1 mmol/L   Chloride 111 98 - 111 mmol/L   CO2 18 (L) 22 - 32 mmol/L   Glucose, Bld 196 (H) 70 - 99 mg/dL   BUN 36 (H) 8 - 23 mg/dL   Creatinine, Ser 1.67 (H) 0.61 - 1.24 mg/dL   Calcium 8.5 (L) 8.9 - 10.3 mg/dL   Total Protein 6.8  6.5 - 8.1 g/dL   Albumin 3.1 (L) 3.5 - 5.0 g/dL   AST 29 15 - 41 U/L   ALT 15 0 - 44 U/L   Alkaline Phosphatase 124 38 - 126 U/L   Total Bilirubin 0.6 0.3 - 1.2 mg/dL   GFR calc non Af Amer 36 (L) >60 mL/min   GFR calc Af Amer 42 (L) >60 mL/min   Anion gap 6 5 - 15    Comment: Performed at [facility], [place] [address] [place], [place] [address]  Lactic acid, plasma     Status: Abnormal   Collection Time: 10/06/18  4:25 PM  Result Value Ref Range   Lactic Acid, Venous 2.1 (HH) 0.5 - 1.9 mmol/L    Comment: CRITICAL RESULT CALLED TO, READ BACK BY AND VERIFIED WITH: E.BEAULAURIER AT 1709 ON 10/06/18 BY N.THOMPSON Performed at [facility], [place] [address] [place], [place] [address]   CBC with Differential     Status: Abnormal   Collection Time: 10/06/18  4:25 PM  Result Value Ref Range   WBC 10.8 (H) 4.0 - 10.5 K/uL   RBC 2.73 (L) 4.22 - 5.81 MIL/uL   Hemoglobin 8.5 (L) 13.0 - 17.0 g/dL   HCT 26.3 (L)  39.0 - 52.0 %   MCV 96.3 80.0 - 100.0 fL   MCH 31.1 26.0 - 34.0 pg   MCHC 32.3 30.0 - 36.0 g/dL   RDW 14.2 11.5 - 15.5 %   Platelets 198 150 - 400 K/uL   nRBC 0.0 0.0 - 0.2 %   Neutrophils Relative % 90 %   Neutro Abs 9.7 (H) 1.7 - 7.7 K/uL   Lymphocytes Relative 2 %   Lymphs Abs 0.2 (L) 0.7 - 4.0 K/uL   Monocytes Relative 6 %   Monocytes Absolute 0.7 0.1 - 1.0 K/uL   Eosinophils Relative 1 %   Eosinophils Absolute 0.1 0.0 - 0.5 K/uL   Basophils Relative 0 %   Basophils Absolute 0.0 0.0 - 0.1 K/uL   WBC Morphology MILD LEFT SHIFT (1-5% METAS, OCC MYELO, OCC BANDS)    Immature Granulocytes 1 %   Abs Immature Granulocytes 0.06 0.00 - 0.07 K/uL    Comment: Performed at [facility], [place] [address] [place], [place] [address]  Protime-INR     Status: None   Collection Time: 10/06/18  4:25 PM  Result Value Ref Range   Prothrombin Time 14.0 11.4 - 15.2 seconds   INR 1.1 0.8 - 1.2    Comment: (NOTE) INR goal varies based on device and disease states. Performed at [facility], [place] [address], [place], [place] [address]   Influenza panel by PCR (type A & B)     Status: None   Collection Time: 10/06/18  5:56 PM  Result Value Ref Range   Influenza A By PCR NEGATIVE NEGATIVE   Influenza B By PCR NEGATIVE NEGATIVE    Comment: (NOTE) The Xpert Xpress Flu assay is intended as an aid in the diagnosis of  influenza and should not be used as a sole basis for treatment.  This  assay is FDA approved for nasopharyngeal swab specimens only. Nasal  washings and aspirates are unacceptable for Xpert Xpress Flu testing. Performed at [facility], [place] [address] [place], [place] [address]   Urinalysis, Complete w Microscopic     Status: Abnormal   Collection Time: 10/06/18  6:20 PM  Result Value Ref Range  Color, Urine STRAW (A) YELLOW   APPearance CLEAR CLEAR   Specific Gravity, Urine 1.011 1.005 - 1.030   pH 5.0 5.0 - 8.0    Glucose, UA NEGATIVE NEGATIVE mg/dL   Hgb urine dipstick MODERATE (A) NEGATIVE   Bilirubin Urine NEGATIVE NEGATIVE   Ketones, ur NEGATIVE NEGATIVE mg/dL   Protein, ur 100 (A) NEGATIVE mg/dL   Nitrite NEGATIVE NEGATIVE   Leukocytes,Ua NEGATIVE NEGATIVE   RBC / HPF 0-5 0 - 5 RBC/hpf   WBC, UA 0-5 0 - 5 WBC/hpf   Bacteria, UA RARE (A) NONE SEEN    Comment: Performed at [facility], [place] [address], [place], [place] [address]  Lactic acid, plasma     Status: None   Collection Time: 10/06/18  6:27 PM  Result Value Ref Range   Lactic Acid, Venous 1.2 0.5 - 1.9 mmol/L    Comment: Performed at [facility], [place] [address], [place], [place] [address]   Dg Chest 2 View  Result Date: 10/06/2018 CLINICAL DATA:  85 year old male with a history of fever and chills EXAM: CHEST - 2 VIEW COMPARISON:  09/01/2018 FINDINGS: Cardiomediastinal silhouette unchanged in size and contour. No pneumothorax. No pleural effusion. Reticular opacities at the left lung base, new from the prior with partial obscuration left hemidiaphragm. No pleural effusion. Destructive soft tissue lesion of the left chest wall involving ninth rib, similar to the prior. IMPRESSION: Reticular opacity at the left lung base, concerning for pneumonia given the history of fever. Redemonstration of destructive left ninth rib lesion. Correlation with contrast-enhanced CT may be useful, as the findings are compatible with malignancy. Electronically Signed   By: Corrie Mckusick D.O.   On: 10/06/2018 17:20    Pending Labs Unresulted Labs (From admission, onward)    Start     Ordered   10/07/18 0500  Comprehensive metabolic panel  Tomorrow morning,   R     10/06/18 2129   10/07/18 0500  CBC WITH DIFFERENTIAL  Tomorrow morning,   R     10/06/18 2129   10/06/18 2128  Strep pneumoniae urinary antigen  ONCE - STAT,   R     10/06/18 2129   10/06/18 2127  Culture, sputum-assessment  ONCE - STAT,   R     10/06/18  2129   10/06/18 2127  Legionella Pneumophila Serogp 1 Ur Ag  ONCE - STAT,   R     10/06/18 2129   10/06/18 2124  Culture, blood (routine x 2) Call MD if unable to obtain prior to antibiotics being given  BLOOD CULTURE X 2,   R    Comments:  If blood cultures drawn in Emergency Department - Do not draw and cancel order    10/06/18 2129   10/06/18 2124  Gram stain  Once,   R     10/06/18 2129   10/06/18 2124  HIV antibody (Routine Screening)  Once,   R     10/06/18 2129   10/06/18 2124  CBC  (heparin)  Once,   R    Comments:  Baseline for heparin therapy IF NOT ALREADY DRAWN.  Notify MD if PLT < 100 K.    10/06/18 2129   10/06/18 2124  Creatinine, serum  (heparin)  Once,   R    Comments:  Baseline for heparin therapy IF NOT ALREADY DRAWN.    10/06/18 2129   10/06/18 2122  Brain natriuretic peptide  Once,   R  10/06/18 2122   10/06/18 2122  Troponin I - Now Then Q6H  Now then every 6 hours,   R     10/06/18 2122   10/06/18 1905  Comprehensive metabolic panel  ONCE - STAT,   R     10/06/18 1905   10/06/18 1551  Urine culture  ONCE - STAT,   STAT     10/06/18 1551   10/06/18 1539  Culture, blood (Routine x 2)  BLOOD CULTURE X 2,   STAT     10/06/18 1538          Vitals/Pain Today's Vitals   10/06/18 2030 10/06/18 2100 10/06/18 2130 10/06/18 2151  BP: 136/69 133/63 124/60   Pulse: 99 99 93   Resp: 17 (!) 21 20   Temp:    98.1 F (36.7 C)  TempSrc:    Oral  SpO2: 97% 98% 96%   Weight:      Height:      PainSc:        Isolation Precautions Droplet precaution  Medications Medications  metroNIDAZOLE (FLAGYL) IVPB 500 mg (500 mg Intravenous New Bag/Given 10/06/18 1759)  heparin injection 5,000 Units (has no administration in time range)  0.9 %  sodium chloride infusion ( Intravenous New Bag/Given 10/06/18 2149)  ceFEPIme (MAXIPIME) 1 g in sodium chloride 0.9 % 100 mL IVPB (has no administration in time range)  sodium chloride flush (NS) 0.9 % injection 3 mL (3 mLs  Intravenous Given 10/06/18 1634)  sodium chloride 0.9 % bolus 1,000 mL (0 mLs Intravenous Stopped 10/06/18 1757)    And  sodium chloride 0.9 % bolus 1,000 mL (0 mLs Intravenous Stopped 10/06/18 1757)  ceFEPIme (MAXIPIME) 2 g in sodium chloride 0.9 % 100 mL IVPB ( Intravenous Stopped 10/06/18 1708)  vancomycin (VANCOCIN) IVPB 1000 mg/200 mL premix (0 mg Intravenous Stopped 10/06/18 2103)    Mobility walks with person assist

## 2018-10-06 NOTE — Progress Notes (Addendum)
CRITICAL VALUE ALERT  Critical Value:  troponin 0.03  Date & Time Notied:  10/06/2298  Provider Notified: Hospitalist  Baltazar Najjar, notified via Amion   Orders Received/Actions taken: waiting for orders ,will continue to monitor.

## 2018-10-06 NOTE — Progress Notes (Signed)
Pharmacy Antibiotic Note  Christian Burns is a 85 y.o. male admitted on 10/06/2018 with pneumonia.  PMH significant for multiple myeloma;  S/p right hip THA 5 weeks ago.  Patient received Vancomycin 1gm IV x 1 in the ED.  Upon admission, Pharmacy has been consulted for Vancomycin dosing.  Plan:  Vancomycin 1000 mg IV Q 36 hrs. Goal AUC 400-550.  Expected AUC: 499   SCr used: 1.67  Cefepime per MD  Follow renal function  Follow culture results and sensitivities  Check vancomycin levels as needed   Height: 5' 10"  (177.8 cm) Weight: 143 lb (64.9 kg) IBW/kg (Calculated) : 73  Temp (24hrs), Avg:100.1 F (37.8 C), Min:98.1 F (36.7 C), Max:102.1 F (38.9 C)  Recent Labs  Lab 10/06/18 1625 10/06/18 1827  WBC 10.8*  --   CREATININE 1.67*  --   LATICACIDVEN 2.1* 1.2    Estimated Creatinine Clearance: 29.1 mL/min (A) (by C-G formula based on SCr of 1.67 mg/dL (H)).    Allergies  Allergen Reactions  . Sulfa Antibiotics Itching  . Sulfasalazine Itching    Antimicrobials this admission: 2/25 cefepime >>   2/25 flagyl >>   2/25 vanc >>  Dose adjustments this admission:    Microbiology results: 2/25 BCx: sent 2/25 UCx: sent  2/25 Sputum: sent    Thank you for allowing pharmacy to be a part of this patient's care.  Everette Rank, PharmD 10/06/2018 9:52 PM

## 2018-10-06 NOTE — Progress Notes (Signed)
A consult was received from an ED physician for Vancomycin and Cefepime per pharmacy dosing (for an indication other than meningitis). The patient's profile has been reviewed for ht/wt/allergies/indication/available labs. A one time order has been placed for the above antibiotics.  Further antibiotics/pharmacy consults should be ordered by admitting physician if indicated.                       Reuel Boom, PharmD, BCPS [phone] 10/06/2018, 4:10 PM

## 2018-10-06 NOTE — ED Triage Notes (Signed)
Patient BIB [facility] from [facility] for fever and chills starting at 1200 today. Initial temp at facility was 101.4, EMS got 105.0. c/o some nausea with one episode of emesis. Pt had 1000mg  tylenol at 1440. Pt had recent hip sx but no symptoms related. A/o 4, ambulates with walker.

## 2018-10-06 NOTE — Progress Notes (Signed)
PHARMACY NOTE:  ANTIMICROBIAL RENAL DOSAGE ADJUSTMENT  Current antimicrobial regimen includes a mismatch between antimicrobial dosage and estimated renal function.  As per policy approved by the Pharmacy & Therapeutics and Medical Executive Committees, the antimicrobial dosage will be adjusted accordingly.  Current antimicrobial dosage:  Cefepime 1gm IV q8h x 8 days  Indication: PNA  Renal Function:  Estimated Creatinine Clearance: 29.1 mL/min (A) (by C-G formula based on SCr of 1.67 mg/dL (H)).    Antimicrobial dosage has been changed to:  Cefepime 1gm IV q24h x 8 days   Thank you for allowing pharmacy to be a part of this patient's care.  Everette Rank, [facility] 10/06/2018 10:00 PM

## 2018-10-06 NOTE — ED Notes (Signed)
Patient had a brown, liquid BM in bedpan and used the urinal. New sheets and linen provided.

## 2018-10-06 NOTE — ED Notes (Signed)
Dr. Ellender Hose notified of elevated lactic of 2.1

## 2018-10-06 NOTE — ED Provider Notes (Signed)
[place] DEPT Provider Note   CSN: [id number] Arrival date & time: 10/06/18  1501    History   Chief Complaint Chief Complaint  Patient presents with  . Fever    HPI Christian Burns is a 85 y.o. male.     HPI   85 yo M with PMHx as below here with fever. Pt reports he was in his usual state of health until today at lunch. He woke up, took a shower, and was feeling fine. He had hip surgery in Jan and has been recovering well. He went to eat and prior to eating, developed acute onset body aches, chills, and fever. He felt very weak. His temp was noted >102 so EMS called. He reports he feels weak but has no areas of pain. He has some mild nausea which is new but denies any abd pain. No vomiting, diarrhea. His R hip sites are intact and he denies any increase in pain in his hip or leg. No other new areas of pain. 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  Past Medical History:  Diagnosis Date  . CKD (chronic kidney disease) 07/06/2018   CKD stage III  . History of radiation therapy 06/17/13-07/06/13   35 gray to upper lumbar spine  . 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 Hypertension   . Inguinal hernia   . Multiple myeloma [facility]) Sept 2010  . Multiple myeloma in relapse ([place]) 04/05/2016  . Radiation 09/26/15-10/20/15   lower thoracic spine, upper lumbar spine 30 gray    Patient Active Problem List   Diagnosis Date Noted  . Right hip pain 09/01/2018  . Iron deficiency anemia secondary to inadequate dietary iron intake 07/18/2016  . Multiple myeloma in relapse ([place]) 04/05/2016  . 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 Multiple myeloma in remission ([place]) 09/08/2015  . Myeloma ([place]) 08/23/2011    Past Surgical History:  Procedure Laterality Date  . COLONOSCOPY    . HIP PINNING,CANNULATED Right 09/01/2018   Procedure: [facility] NAIL HIP PINNING;  Surgeon: Melrose Nakayama, MD;  Location: [place];  Service: Orthopedics;   Laterality: Right;  . TONSILLECTOMY          Home Medications    Prior to Admission medications   Medication Sig Start Date End Date Taking? Authorizing Provider  amLODipine-benazepril (LOTREL) 5-10 MG capsule TAKE 1 CAPSULE BY MOUTH DAILY 09/19/18  Yes [name], MD  aspirin EC 325 MG EC tablet Take 1 tablet (325 mg total) by mouth 2 (two) times daily after a meal. 09/02/18  Yes Nida, Mitzi Hansen, PA-C  finasteride (PROSCAR) 5 MG tablet Take 5 mg by mouth daily.   Yes [provider]  Multiple Vitamin (MULTIVITAMIN WITH MINERALS) TABS tablet Take 1 tablet by mouth daily.   Yes [provider]  penicillin v potassium (VEETID) 500 MG tablet Take 1 tablet (500 mg total) by mouth QID. 05/26/18  Yes Carlyle Basques, MD  Probiotic Product (PROBIOTIC PO) Take 1 capsule by mouth daily.   Yes [provider]  pyridOXINE (VITAMIN B-6) 100 MG tablet Take 100 mg by mouth daily.  09/17/13  Yes [name], MD  traMADol (ULTRAM) 50 MG tablet Take 50 mg by mouth every 8 (eight) hours as needed for pain. 09/07/18  Yes [provider]  vitamin B-12 (CYANOCOBALAMIN) 500 MCG tablet Take 500 mcg by mouth daily.   Yes [provider]  Vitamin D, Ergocalciferol, (DRISDOL) 50000 UNITS CAPS capsule  Take 50,000 Units by mouth every Sunday.    Yes [provider]  montelukast (SINGULAIR) 10 MG tablet Take 1 tablet (10 mg total) by mouth at bedtime. 08/27/18   [name], MD  pomalidomide (POMALYST) 2 MG capsule Take 1 capsule (2 mg total) by mouth daily. Take with water on days 1-21. Repeat every 28 days. [id number] 08/13/18   [name], MD    Family History History reviewed. No pertinent family history.  Social History Social History   Tobacco Use  . Smoking status: Never Smoker  . Smokeless tobacco: Never Used  . Tobacco comment: never used tobacco  Substance Use Topics  . Alcohol use: Yes    Alcohol/week: 4.0 standard drinks    Types:  4 Glasses of wine per week  . Drug use: No     Allergies   Sulfa antibiotics and Sulfasalazine   Review of Systems Review of Systems  Constitutional: Positive for chills, fatigue and fever.  HENT: Negative for congestion and rhinorrhea.   Eyes: Negative for visual disturbance.  Respiratory: Negative for cough, shortness of breath and wheezing.   Cardiovascular: Negative for chest pain and leg swelling.  Gastrointestinal: Positive for nausea. Negative for abdominal pain, diarrhea and vomiting.  Genitourinary: Negative for dysuria and flank pain.  Musculoskeletal: Negative for neck pain and neck stiffness.  Skin: Negative for rash and wound.  Allergic/Immunologic: Negative for immunocompromised state.  Neurological: Positive for weakness. Negative for syncope and headaches.  All other systems reviewed and are negative.    Physical Exam Updated Vital Signs BP 136/69   Pulse 99   Temp (!) 102.1 F (38.9 C) (Oral)   Resp 17   Ht 5' 10"  (1.778 m)   Wt 64.9 kg   SpO2 97%   BMI 20.52 kg/m   Physical Exam Vitals signs and nursing note reviewed.  Constitutional:      General: He is not in acute distress.    Appearance: He is well-developed.  HENT:     Head: Normocephalic and atraumatic.     Comments: Mildly dry MM. No jaw TTP. No facial erythema or TTP. Poor dentition, at baseline. No gingival abscess. No sublingual edema. Eyes:     Conjunctiva/sclera: Conjunctivae normal.  Neck:     Musculoskeletal: Neck supple.  Cardiovascular:     Rate and Rhythm: Regular rhythm. Tachycardia present.     Heart sounds: Normal heart sounds. No murmur. No friction rub.  Pulmonary:     Effort: Pulmonary effort is normal. Tachypnea present. No respiratory distress.     Breath sounds: Normal breath sounds. No wheezing or rales.  Abdominal:     General: There is no distension.     Palpations: Abdomen is soft.     Tenderness: There is no abdominal tenderness.  Skin:    General: Skin is  warm.     Capillary Refill: Capillary refill takes less than 2 seconds.  Neurological:     Mental Status: He is alert and oriented to person, place, and time.     Motor: No abnormal muscle tone.      ED Treatments / Results  Labs (all labs ordered are listed, but only abnormal results are displayed) Labs Reviewed  COMPREHENSIVE METABOLIC PANEL - Abnormal; Notable for the following components:      Result Value   CO2 18 (*)    Glucose, Bld 196 (*)    BUN 36 (*)    Creatinine, Ser 1.67 (*)    Calcium  8.5 (*)    Albumin 3.1 (*)    GFR calc non Af Amer 36 (*)    GFR calc Af Amer 42 (*)    All other components within normal limits  LACTIC ACID, PLASMA - Abnormal; Notable for the following components:   Lactic Acid, Venous 2.1 (*)    All other components within normal limits  CBC WITH DIFFERENTIAL/PLATELET - Abnormal; Notable for the following components:   WBC 10.8 (*)    RBC 2.73 (*)    Hemoglobin 8.5 (*)    HCT 26.3 (*)    Neutro Abs 9.7 (*)    Lymphs Abs 0.2 (*)    All other components within normal limits  URINALYSIS, COMPLETE (UACMP) WITH MICROSCOPIC - Abnormal; Notable for the following components:   Color, Urine STRAW (*)    Hgb urine dipstick MODERATE (*)    Protein, ur 100 (*)    Bacteria, UA RARE (*)    All other components within normal limits  CULTURE, BLOOD (ROUTINE X 2)  CULTURE, BLOOD (ROUTINE X 2)  URINE CULTURE  LACTIC ACID, PLASMA  PROTIME-INR  INFLUENZA PANEL BY PCR (TYPE A & B)  COMPREHENSIVE METABOLIC PANEL    EKG None  Radiology Dg Chest 2 View  Result Date: 10/06/2018 CLINICAL DATA:  85 year old male with a history of fever and chills EXAM: CHEST - 2 VIEW COMPARISON:  09/01/2018 FINDINGS: Cardiomediastinal silhouette unchanged in size and contour. No pneumothorax. No pleural effusion. Reticular opacities at the left lung base, new from the prior with partial obscuration left hemidiaphragm. No pleural effusion. Destructive soft tissue lesion of  the left chest wall involving ninth rib, similar to the prior. IMPRESSION: Reticular opacity at the left lung base, concerning for pneumonia given the history of fever. Redemonstration of destructive left ninth rib lesion. Correlation with contrast-enhanced CT may be useful, as the findings are compatible with malignancy. Electronically Signed   By: Corrie Mckusick D.O.   On: 10/06/2018 17:20    Procedures .Critical Care Performed by: Duffy Bruce, MD Authorized by: Duffy Bruce, MD   Critical care provider statement:    Critical care time (minutes):  45   Critical care time was exclusive of:  Separately billable procedures and treating other patients and teaching time   Critical care was necessary to treat or prevent imminent or life-threatening deterioration of the following conditions:  Cardiac failure, circulatory failure, respiratory failure and sepsis   Critical care was time spent personally by me on the following activities:  Development of treatment plan with patient or surrogate, discussions with consultants, evaluation of patient's response to treatment, examination of patient, obtaining history from patient or surrogate, ordering and performing treatments and interventions, ordering and review of laboratory studies, ordering and review of radiographic studies, pulse oximetry, re-evaluation of patient's condition and review of old charts   I assumed direction of critical care for this patient from another provider in my specialty: no     (including critical care time)  Medications Ordered in ED Medications  metroNIDAZOLE (FLAGYL) IVPB 500 mg (500 mg Intravenous New Bag/Given 10/06/18 1759)  sodium chloride flush (NS) 0.9 % injection 3 mL (3 mLs Intravenous Given 10/06/18 1634)  sodium chloride 0.9 % bolus 1,000 mL (0 mLs Intravenous Stopped 10/06/18 1757)    And  sodium chloride 0.9 % bolus 1,000 mL (0 mLs Intravenous Stopped 10/06/18 1757)  ceFEPIme (MAXIPIME) 2 g in sodium  chloride 0.9 % 100 mL IVPB ( Intravenous Stopped 10/06/18 1708)  vancomycin (  VANCOCIN) IVPB 1000 mg/200 mL premix (1,000 mg Intravenous New Bag/Given 10/06/18 1912)     Initial Impression / Assessment and Plan / ED Course  I have reviewed the triage vital signs and the nursing notes.  Pertinent labs & imaging results that were available during my care of the patient were reviewed by me and considered in my medical decision making (see chart for details).       85 yo M with PMHx as above here with sepsis. Imaging is c/w PNA. Pt febrile, tachycardic, but not hypoxic. Labs c/w sepsis, no signs of shock. BP markedly improved w/ fluids. Admit to medicine. IV ABX given.  Final Clinical Impressions(s) / ED Diagnoses   Final diagnoses:  Sepsis due to pneumonia [facility])    ED Discharge Orders    None       Duffy Bruce, MD 10/06/18 2044

## 2018-10-07 ENCOUNTER — Inpatient Hospital Stay (HOSPITAL_COMMUNITY): Payer: Medicare HMO

## 2018-10-07 ENCOUNTER — Other Ambulatory Visit: Payer: Self-pay

## 2018-10-07 DIAGNOSIS — R6520 Severe sepsis without septic shock: Secondary | ICD-10-CM

## 2018-10-07 DIAGNOSIS — R079 Chest pain, unspecified: Secondary | ICD-10-CM

## 2018-10-07 DIAGNOSIS — J189 Pneumonia, unspecified organism: Secondary | ICD-10-CM

## 2018-10-07 DIAGNOSIS — Z96641 Presence of right artificial hip joint: Secondary | ICD-10-CM

## 2018-10-07 DIAGNOSIS — A419 Sepsis, unspecified organism: Principal | ICD-10-CM

## 2018-10-07 LAB — CBC WITH DIFFERENTIAL/PLATELET
Abs Immature Granulocytes: 0 10*3/uL (ref 0.00–0.07)
Basophils Absolute: 0 10*3/uL (ref 0.0–0.1)
Basophils Relative: 0 %
Eosinophils Absolute: 0 10*3/uL (ref 0.0–0.5)
Eosinophils Relative: 0 %
HCT: 22.8 % — ABNORMAL LOW (ref 39.0–52.0)
HEMOGLOBIN: 7.3 g/dL — AB (ref 13.0–17.0)
Lymphocytes Relative: 1 %
Lymphs Abs: 0.1 10*3/uL — ABNORMAL LOW (ref 0.7–4.0)
MCH: 31.6 pg (ref 26.0–34.0)
MCHC: 32 g/dL (ref 30.0–36.0)
MCV: 98.7 fL (ref 80.0–100.0)
Monocytes Absolute: 0.1 10*3/uL (ref 0.1–1.0)
Monocytes Relative: 1 %
Neutro Abs: 11.7 10*3/uL — ABNORMAL HIGH (ref 1.7–7.7)
Neutrophils Relative %: 98 %
Platelets: 161 10*3/uL (ref 150–400)
RBC: 2.31 MIL/uL — ABNORMAL LOW (ref 4.22–5.81)
RDW: 14.4 % (ref 11.5–15.5)
WBC: 11.9 10*3/uL — ABNORMAL HIGH (ref 4.0–10.5)
nRBC: 0 % (ref 0.0–0.2)

## 2018-10-07 LAB — COMPREHENSIVE METABOLIC PANEL
ALT: 18 U/L (ref 0–44)
AST: 50 U/L — ABNORMAL HIGH (ref 15–41)
Albumin: 2.6 g/dL — ABNORMAL LOW (ref 3.5–5.0)
Alkaline Phosphatase: 100 U/L (ref 38–126)
Anion gap: 3 — ABNORMAL LOW (ref 5–15)
BILIRUBIN TOTAL: 0.7 mg/dL (ref 0.3–1.2)
BUN: 29 mg/dL — ABNORMAL HIGH (ref 8–23)
CO2: 18 mmol/L — ABNORMAL LOW (ref 22–32)
Calcium: 7.8 mg/dL — ABNORMAL LOW (ref 8.9–10.3)
Chloride: 118 mmol/L — ABNORMAL HIGH (ref 98–111)
Creatinine, Ser: 1.44 mg/dL — ABNORMAL HIGH (ref 0.61–1.24)
GFR calc Af Amer: 51 mL/min — ABNORMAL LOW (ref >60)
GFR calc non Af Amer: 44 mL/min — ABNORMAL LOW (ref >60)
Glucose, Bld: 109 mg/dL — ABNORMAL HIGH (ref 70–99)
POTASSIUM: 4.3 mmol/L (ref 3.5–5.1)
Sodium: 139 mmol/L (ref 135–145)
Total Protein: 6 g/dL — ABNORMAL LOW (ref 6.5–8.1)

## 2018-10-07 LAB — MRSA PCR SCREENING: MRSA BY PCR: NEGATIVE

## 2018-10-07 LAB — HIV ANTIBODY (ROUTINE TESTING W REFLEX): HIV Screen 4th Generation wRfx: NONREACTIVE

## 2018-10-07 LAB — STREP PNEUMONIAE URINARY ANTIGEN: Strep Pneumo Urinary Antigen: NEGATIVE

## 2018-10-07 LAB — TROPONIN I: Troponin I: 0.03 ng/mL (ref <0.03)

## 2018-10-07 LAB — ECHOCARDIOGRAM COMPLETE
Height: 70 in
Weight: 2288 oz

## 2018-10-07 MED ORDER — IPRATROPIUM-ALBUTEROL 0.5-2.5 (3) MG/3ML IN SOLN: 3.0000 mL | Freq: Four times a day (QID) | RESPIRATORY_TRACT | Status: DC | PRN

## 2018-10-07 NOTE — Plan of Care (Signed)
  Problem: Activity: Goal: Ability to tolerate increased activity will improve Outcome: Progressing   Problem: Clinical Measurements: Goal: Ability to maintain a body temperature in the normal range will improve Outcome: Progressing   Problem: Respiratory: Goal: Ability to maintain adequate ventilation will improve Outcome: Progressing Goal: Ability to maintain a clear airway will improve Outcome: Progressing   Problem: Respiratory: Goal: Ability to maintain a clear airway will improve Outcome: Progressing   Problem: Clinical Measurements: Goal: Diagnostic test results will improve Outcome: Progressing

## 2018-10-07 NOTE — Plan of Care (Signed)
  Problem: Activity: Goal: Ability to tolerate increased activity will improve Outcome: Progressing   Problem: Respiratory: Goal: Ability to maintain adequate ventilation will improve Outcome: Progressing   

## 2018-10-07 NOTE — Progress Notes (Signed)
Pt. arrived to the unit from ED, A&O X 4 and verbally responding .Pt.is continent,skin warm and dry and intact with no new s/s of impairment. No family at bed side.Pt. denied pain. Orientation to unit and to the call buttons given pt.reponde understanding.

## 2018-10-07 NOTE — Progress Notes (Signed)
CRITICAL VALUE ALERT  Critical Value:  Troponin 0.03  Date & Time Notied:  10/07/2018  1336  Provider Notified: Dr Rodena Piety  Orders Received/Actions taken: Waiting for orders

## 2018-10-07 NOTE — Progress Notes (Signed)
  Echocardiogram 2D Echocardiogram has been performed.  Christian Burns 10/07/2018, 9:16 AM

## 2018-10-07 NOTE — Progress Notes (Signed)
PROGRESS NOTE    Christian Burns  ZSM:[id number] DOB: [birth year] DOA: 10/06/2018 PCP: Leanna Battles, MD   Brief Narrative:85 y.o. WM PMHx multiple myeloma treated by Dr. Marin Olp (oncology):, CKD stage III.  Scheduled to start new treatment on Thursday.  S/p right hip THA 5 weeks ago.  Left shoulder pain negative radiation started today (EKG sinus rhythm) nonreproducible with pressure.  Patient states negative S OB over the last couple days however felt as if he was going to pass out (presyncope?).   Assessment & Plan:   Active Problems:   Multiple myeloma in relapse [facility])   Right hip pain   Left lower lobe pneumonia ([place])   History of total right hip replacement    1) HCAP LUL Pneumonia-patient continues to have cough and shortness of breath.  His T-max  102.1.  He is immunocompromised due to multiple myeloma.  Continue IV antibiotics and DuoNeb.  Increase activity PT consult.  Influenza a and B-.  Lactic acid 1.2.  Leukocyte count 11.9.  Urine strep pneumo negative.  2) multiple myeloma in relapse inform Dr. Marin Olp of patient's admission.  Patient has appointment to see him tomorrow Thursday.  3) stage III CKD with hydration and monitor creatinine.  Creatinine today is 1.44 improved since admission 1.62.  4) atypical chest pain left shoulder pain patient denies any chest pain at this time.  Troponin 0 0.03.  Echocardiogram pending.  5) status post right hip total hip arthroplasty patient uses a walker at home.  PT OT consult.  Estimated body mass index is 20.52 kg/m as calculated from the following:   Height as of this encounter: 5' 10"  (1.778 m).   Weight as of this encounter: 64.9 kg.  DVT prophylaxis: Heparin Code Status: Full code Family Communication: No family available Disposition Plan pending clinical improvement  Consultants:   Oncology Dr. Marin Olp  Procedures: None Antimicrobials: None  Subjective: Resting in bed coughing and short of breath  Objective:  Appears weak lethargy chronically frail ill looking male Vitals:   10/06/18 2151 10/06/18 2200 10/06/18 2251 10/07/18 0409  BP:  130/70 125/64 131/69  Pulse:  91 85 95  Resp:  20 16 16   Temp: 98.1 F (36.7 C)  98.2 F (36.8 C) 98.5 F (36.9 C)  TempSrc: Oral     SpO2:  97% 99% 97%  Weight:      Height:        Intake/Output Summary (Last 24 hours) at 10/07/2018 0825 Last data filed at 10/07/2018 0300 Gross per 24 hour  Intake 2888.75 ml  Output 475 ml  Net 2413.75 ml   Filed Weights   10/06/18 1536  Weight: 64.9 kg    Examination:  General exam: Appears calm and comfortable  Respiratory system: Scattered rhonchi coarse breath sounds at the left to auscultation. Respiratory effort normal. Cardiovascular system: S1 & S2 heard, RRR. No JVD, murmurs, rubs, gallops or clicks. No pedal edema. Gastrointestinal system: Abdomen is nondistended, soft and nontender. No organomegaly or masses felt. Normal bowel sounds heard. Central nervous system: Alert and oriented. No focal neurological deficits. Extremities: Symmetric 5 x 5 power. Skin: No rashes, lesions or ulcers Psychiatry: Judgement and insight appear normal. Mood & affect appropriate.     Data Reviewed: I have personally reviewed following labs and imaging studies  CBC: Recent Labs  Lab 10/06/18 1625 10/06/18 2142 10/07/18 0319  WBC 10.8* 12.8* 11.9*  NEUTROABS 9.7*  --  11.7*  HGB 8.5* 7.4* 7.3*  HCT 26.3* 24.1*  22.8*  MCV 96.3 99.2 98.7  PLT 198 174 800   Basic Metabolic Panel: Recent Labs  Lab 10/06/18 1625 10/06/18 2142 10/07/18 0319  NA 135 137 139  K 4.9 4.6 4.3  CL 111 115* 118*  CO2 18* 16* 18*  GLUCOSE 196* 221* 109*  BUN 36* 31* 29*  CREATININE 1.67* 1.62* 1.44*  CALCIUM 8.5* 7.7* 7.8*   GFR: Estimated Creatinine Clearance: 33.8 mL/min (A) (by C-G formula based on SCr of 1.44 mg/dL (H)). Liver Function Tests: Recent Labs  Lab 10/06/18 1625 10/06/18 2142 10/07/18 0319  AST 29 47* 50*    ALT 15 17 18   ALKPHOS 124 102 100  BILITOT 0.6 0.6 0.7  PROT 6.8 6.1* 6.0*  ALBUMIN 3.1* 2.7* 2.6*   No results for input(s): LIPASE, AMYLASE in the last 168 hours. No results for input(s): AMMONIA in the last 168 hours. Coagulation Profile: Recent Labs  Lab 10/06/18 1625  INR 1.1   Cardiac Enzymes: Recent Labs  Lab 10/06/18 2142 10/07/18 0319  TROPONINI 0.03* <0.03   BNP (last 3 results) No results for input(s): PROBNP in the last 8760 hours. HbA1C: No results for input(s): HGBA1C in the last 72 hours. CBG: No results for input(s): GLUCAP in the last 168 hours. Lipid Profile: No results for input(s): CHOL, HDL, LDLCALC, TRIG, CHOLHDL, LDLDIRECT in the last 72 hours. Thyroid Function Tests: No results for input(s): TSH, T4TOTAL, FREET4, T3FREE, THYROIDAB in the last 72 hours. Anemia Panel: No results for input(s): VITAMINB12, FOLATE, FERRITIN, TIBC, IRON, RETICCTPCT in the last 72 hours. Sepsis Labs: Recent Labs  Lab 10/06/18 1625 10/06/18 1827  LATICACIDVEN 2.1* 1.2    No results found for this or any previous visit (from the past 240 hour(s)).       Radiology Studies: Dg Chest 2 View  Result Date: 10/06/2018 CLINICAL DATA:  85 year old male with a history of fever and chills EXAM: CHEST - 2 VIEW COMPARISON:  09/01/2018 FINDINGS: Cardiomediastinal silhouette unchanged in size and contour. No pneumothorax. No pleural effusion. Reticular opacities at the left lung base, new from the prior with partial obscuration left hemidiaphragm. No pleural effusion. Destructive soft tissue lesion of the left chest wall involving ninth rib, similar to the prior. IMPRESSION: Reticular opacity at the left lung base, concerning for pneumonia given the history of fever. Redemonstration of destructive left ninth rib lesion. Correlation with contrast-enhanced CT may be useful, as the findings are compatible with malignancy. Electronically Signed   By: Corrie Mckusick D.O.   On:  10/06/2018 17:20        Scheduled Meds: . heparin  5,000 Units Subcutaneous Q8H   Continuous Infusions: . sodium chloride 75 mL/hr at 10/06/18 2149  . ceFEPime (MAXIPIME) IV    . vancomycin       LOS: 1 day     Georgette Shell, MD [facility]  If 7PM-7AM, please contact night-coverage [email or web address] Password [email or web address] 10/07/2018, 8:25 AM

## 2018-10-08 ENCOUNTER — Other Ambulatory Visit: Payer: Medicare HMO

## 2018-10-08 ENCOUNTER — Ambulatory Visit: Payer: Medicare HMO

## 2018-10-08 ENCOUNTER — Ambulatory Visit: Payer: Medicare HMO | Admitting: Hematology & Oncology

## 2018-10-08 DIAGNOSIS — C9002 Multiple myeloma in relapse: Secondary | ICD-10-CM

## 2018-10-08 DIAGNOSIS — C9001 Multiple myeloma in remission: Secondary | ICD-10-CM

## 2018-10-08 DIAGNOSIS — J181 Lobar pneumonia, unspecified organism: Secondary | ICD-10-CM

## 2018-10-08 LAB — CBC
HEMATOCRIT: 22.5 % — AB (ref 39.0–52.0)
HEMOGLOBIN: 6.9 g/dL — AB (ref 13.0–17.0)
MCH: 30.8 pg (ref 26.0–34.0)
MCHC: 30.7 g/dL (ref 30.0–36.0)
MCV: 100.4 fL — AB (ref 80.0–100.0)
Platelets: 151 10*3/uL (ref 150–400)
RBC: 2.24 MIL/uL — ABNORMAL LOW (ref 4.22–5.81)
RDW: 14.4 % (ref 11.5–15.5)
WBC: 8.1 10*3/uL (ref 4.0–10.5)
nRBC: 0 % (ref 0.0–0.2)

## 2018-10-08 LAB — URINE CULTURE: Culture: NO GROWTH

## 2018-10-08 LAB — COMPREHENSIVE METABOLIC PANEL
ALK PHOS: 87 U/L (ref 38–126)
ALT: 18 U/L (ref 0–44)
AST: 41 U/L (ref 15–41)
Albumin: 2.4 g/dL — ABNORMAL LOW (ref 3.5–5.0)
Anion gap: 4 — ABNORMAL LOW (ref 5–15)
BUN: 21 mg/dL (ref 8–23)
CO2: 18 mmol/L — ABNORMAL LOW (ref 22–32)
Calcium: 7.8 mg/dL — ABNORMAL LOW (ref 8.9–10.3)
Chloride: 117 mmol/L — ABNORMAL HIGH (ref 98–111)
Creatinine, Ser: 1.39 mg/dL — ABNORMAL HIGH (ref 0.61–1.24)
GFR calc Af Amer: 53 mL/min — ABNORMAL LOW (ref >60)
GFR calc non Af Amer: 46 mL/min — ABNORMAL LOW (ref >60)
Glucose, Bld: 95 mg/dL (ref 70–99)
Potassium: 3.7 mmol/L (ref 3.5–5.1)
Sodium: 139 mmol/L (ref 135–145)
Total Bilirubin: 0.6 mg/dL (ref 0.3–1.2)
Total Protein: 5.6 g/dL — ABNORMAL LOW (ref 6.5–8.1)

## 2018-10-08 LAB — PREPARE RBC (CROSSMATCH)

## 2018-10-08 MED ORDER — SODIUM CHLORIDE 0.9 % IV SOLN
40.0000 mg | Freq: Once | INTRAVENOUS | Status: AC
  Administered 2018-10-08: 40 mg via INTRAVENOUS
  Filled 2018-10-08: qty 4

## 2018-10-08 MED ORDER — IMMUNE GLOBULIN (HUMAN) 20 GM/200ML IV SOLN
40.0000 g | Freq: Once | INTRAVENOUS | Status: AC
  Administered 2018-10-08: 40 g via INTRAVENOUS
  Filled 2018-10-08: qty 400

## 2018-10-08 MED ORDER — METHYLPREDNISOLONE SODIUM SUCC 125 MG IJ SOLR
80.0000 mg | Freq: Once | INTRAMUSCULAR | Status: AC
  Administered 2018-10-08: 80 mg via INTRAVENOUS
  Filled 2018-10-08: qty 2

## 2018-10-08 MED ORDER — SODIUM CHLORIDE 0.9 % IV SOLN
1.0000 g | Freq: Two times a day (BID) | INTRAVENOUS | Status: DC
  Administered 2018-10-09 – 2018-10-10 (×3): 1 g via INTRAVENOUS
  Filled 2018-10-08 (×4): qty 1

## 2018-10-08 MED ORDER — SODIUM CHLORIDE 0.9% IV SOLUTION: Freq: Once | INTRAVENOUS | Status: DC

## 2018-10-08 MED ORDER — DARBEPOETIN ALFA 300 MCG/0.6ML IJ SOSY
300.0000 ug | PREFILLED_SYRINGE | Freq: Once | INTRAMUSCULAR | Status: AC
  Administered 2018-10-08: 300 ug via SUBCUTANEOUS
  Filled 2018-10-08: qty 0.6

## 2018-10-08 MED ORDER — IMMUNE GLOBULIN (HUMAN) 20 GM/200ML IV SOLN
40.0000 g | INTRAVENOUS | Status: DC
  Filled 2018-10-08: qty 400

## 2018-10-08 NOTE — Progress Notes (Signed)
CRITICAL VALUE ALERT  Critical Value:  Hemoglobin:6.9  Date & Time Notied:  10/08/2018 0703 am  Provider Notified:Md. E mathew  Orders Received/Actions taken: waiting for orders and continue to monitor.

## 2018-10-08 NOTE — Progress Notes (Signed)
Spoke with Cassie at [facility] who will set up transport for patient to be brought to [facility] at approximately 1330 on 10/09/2018. Pt's nurse, Ginger, aware that I have called [facility] and will make sure that the nurse tomorrow is aware to send signed consent with patient. Jobe Igo, RN

## 2018-10-08 NOTE — Progress Notes (Signed)
PT Cancellation Note  Patient Details Name: FINIS HENDRICKSEN MRN: [id number] DOB: 1930/08/04   Cancelled Treatment:    Reason Eval/Treat Not Completed: Medical issues which prohibited therapy--Hgb 6.9 Will check back once pt is medically ready.    Weston Anna, PT Acute Rehabilitation Services Pager: [phone] Office: [phone]

## 2018-10-08 NOTE — Progress Notes (Signed)
PROGRESS NOTE    Christian Burns  MDY:[id number] DOB: [birth year] DOA: 10/06/2018 PCP: Leanna Battles, MD  Brief Narrative::85 y.o.WM PMHxmultiple myeloma treated by Dr. Ennever(oncology):, CKD stageIII. Scheduled to start new treatment on Thursday.S/p right hip THA 5 weeks ago. Left shoulder pain negative radiation started today (EKG sinus rhythm) nonreproducible with pressure. Patient states negative S OB over the last couple days however felt as if he was going to pass out (presyncope?).  Assessment & Plan:   Active Problems:   Multiple myeloma in relapse [facility])   Right hip pain   Left lower lobe pneumonia ([place])   History of total right hip replacement   Sepsis due to pneumonia ([place])  1) HCAP LUL Pneumonia-patient continues to have cough and shortness of breath.  His T-max  102.1.  He is immunocompromised due to multiple myeloma.  Continue IV antibiotics and DuoNeb.  Increase activity PT consult.  Influenza a and B-.  Lactic acid 1.2.  Leukocyte count 8.1. Urine strep pneumo negative.  2) multiple myeloma in relapse oncology input.  Patient did receive IVIG today.  Holding off on any further monoclonal antibody until his out of the hospital and improved.  3) stage III CKD with hydration and monitor creatinine.  Improving with IV hydration.  4) atypical chest pain left shoulder pain patient denies any chest pain at this time.  Troponin 0 0.03.  Echocardiogram ejection fraction 60 to 65%.  A tiny mobile density on the atrial side of the mitral valve leaflet may represent a ruptured chordae tendineae but cannot rule out vegetation.  I will consult cardiology for TEE in view office fever immunocompromise state to rule out endocarditis.  5) status post right hip total hip arthroplasty patient uses a walker at home.  PT OT consult.  6] anemia secondary to #2 and #3 hemoglobin 6.9 today will transfuse 2 units of packed RBC.   DVT prophylaxis: Heparin Code Status: Full  code Family Communication: No family available Disposition Plan pending clinical improvement  Consultants:   Oncology Dr. Marin Olp  Procedures: None Antimicrobials: None      Estimated body mass index is 20.52 kg/m as calculated from the following:   Height as of this encounter: _0  (1.778 m).   Weight as of this encounter: 64.9 kg.   Subjective: Patient is up in bed anxious to go home lives with his wife at [facility]   Objective: Vitals:   10/06/18 2251 10/07/18 0409 10/07/18 2111 10/08/18 0420  BP: 125/64 131/69 133/71 (!) 144/71  Pulse: 85 95 82 84  Resp: _1 Temp: 98.2 F (36.8 C) 98.5 F (36.9 C) 98.9 F (37.2 C) 98.3 F (36.8 C)  TempSrc:   Oral Oral  SpO2: 99% 97% 100% 94%  Weight:      Height:        Intake/Output Summary (Last 24 hours) at 10/08/2018 0911 Last data filed at 10/08/2018 0600 Gross per 24 hour  Intake 1560.14 ml  Output 250 ml  Net 1310.14 ml   Filed Weights   10/06/18 1536  Weight: 64.9 kg    Examination:  General exam: Appears calm and comfortable  Respiratory system: Scattered rhonchi to auscultation. Respiratory effort normal. Cardiovascular system: S1 & S2 heard, RRR. No JVD, murmurs, rubs, gallops or clicks. No pedal edema. Gastrointestinal system: Abdomen is nondistended, soft and nontender. No organomegaly or masses felt. Normal bowel sounds heard. Central nervous system: Alert and oriented. No focal neurological deficits. Extremities: Symmetric 5  x 5 power. Skin: No rashes, lesions or ulcers Psychiatry: Judgement and insight appear normal. Mood & affect appropriate.     Data Reviewed: I have personally reviewed following labs and imaging studies  CBC: Recent Labs  Lab 10/06/18 1625 10/06/18 2142 10/07/18 0319 10/08/18 0556  WBC 10.8* 12.8* 11.9* 8.1  NEUTROABS 9.7*  --  11.7*  --   HGB 8.5* 7.4* 7.3* 6.9*  HCT 26.3* 24.1* 22.8* 22.5*  MCV 96.3 99.2 98.7 100.4*  PLT 198 174 161 383   Basic  Metabolic Panel: Recent Labs  Lab 10/06/18 1625 10/06/18 2142 10/07/18 0319 10/08/18 0556  NA 135 137 139 139  K 4.9 4.6 4.3 3.7  CL 111 115* 118* 117*  CO2 18* 16* 18* 18*  GLUCOSE 196* 221* 109* 95  BUN 36* 31* 29* 21  CREATININE 1.67* 1.62* 1.44* 1.39*  CALCIUM 8.5* 7.7* 7.8* 7.8*   GFR: Estimated Creatinine Clearance: 35 mL/min (A) (by C-G formula based on SCr of 1.39 mg/dL (H)). Liver Function Tests: Recent Labs  Lab 10/06/18 1625 10/06/18 2142 10/07/18 0319 10/08/18 0556  AST 29 47* 50* 41  ALT _0 ALKPHOS 124 102 100 87  BILITOT 0.6 0.6 0.7 0.6  PROT 6.8 6.1* 6.0* 5.6*  ALBUMIN 3.1* 2.7* 2.6* 2.4*   No results for input(s): LIPASE, AMYLASE in the last 168 hours. No results for input(s): AMMONIA in the last 168 hours. Coagulation Profile: Recent Labs  Lab 10/06/18 1625  INR 1.1   Cardiac Enzymes: Recent Labs  Lab 10/06/18 2142 10/07/18 0319 10/07/18 0909  TROPONINI 0.03* <0.03 0.03*   BNP (last 3 results) No results for input(s): PROBNP in the last 8760 hours. HbA1C: No results for input(s): HGBA1C in the last 72 hours. CBG: No results for input(s): GLUCAP in the last 168 hours. Lipid Profile: No results for input(s): CHOL, HDL, LDLCALC, TRIG, CHOLHDL, LDLDIRECT in the last 72 hours. Thyroid Function Tests: No results for input(s): TSH, T4TOTAL, FREET4, T3FREE, THYROIDAB in the last 72 hours. Anemia Panel: No results for input(s): VITAMINB12, FOLATE, FERRITIN, TIBC, IRON, RETICCTPCT in the last 72 hours. Sepsis Labs: Recent Labs  Lab 10/06/18 1625 10/06/18 1827  LATICACIDVEN 2.1* 1.2    Recent Results (from the past 240 hour(s))  Culture, blood (Routine x 2)     Status: None (Preliminary result)   Collection Time: 10/06/18  4:25 PM  Result Value Ref Range Status   Specimen Description   Final    BLOOD RIGHT ANTECUBITAL Performed at [facility] [address], [employer], [place] [address]    Special Requests    Final    BOTTLES DRAWN AEROBIC AND ANAEROBIC Blood Culture adequate volume Performed at [facility] [address] [place], [place] [address]    Culture   Final    NO GROWTH < 24 HOURS Performed at [facility] [address] [place], [place] [address]    Report Status PENDING  Incomplete  Culture, blood (Routine x 2)     Status: None (Preliminary result)   Collection Time: 10/06/18  4:27 PM  Result Value Ref Range Status   Specimen Description   Final    BLOOD RIGHT FOREARM Performed at [facility] [address] [place], [place] [address]    Special Requests   Final    BOTTLES DRAWN AEROBIC AND ANAEROBIC Blood Culture adequate volume Performed at [facility] [address] [place], [place]  [address]    Culture   Final    NO GROWTH < 24 HOURS Performed at [facility], [place] [address], [place], [place] [address]    Report Status PENDING  Incomplete  MRSA PCR Screening     Status: None   Collection Time: 10/07/18  9:17 AM  Result Value Ref Range Status   MRSA by PCR NEGATIVE NEGATIVE Final    Comment:        The GeneXpert MRSA Assay (FDA approved for NASAL specimens only), is one component of a comprehensive MRSA colonization surveillance program. It is not intended to diagnose MRSA infection nor to guide or monitor treatment for MRSA infections. Performed at [facility], [place] [address], [place], [place] [address]          Radiology Studies: Dg Chest 2 View  Result Date: 10/06/2018 CLINICAL DATA:  85 year old male with a history of fever and chills EXAM: CHEST - 2 VIEW COMPARISON:  09/01/2018 FINDINGS: Cardiomediastinal silhouette unchanged in size and contour. No pneumothorax. No pleural effusion. Reticular opacities at the left lung base, new from the prior with partial obscuration left hemidiaphragm. No pleural effusion. Destructive soft tissue lesion of the left  chest wall involving ninth rib, similar to the prior. IMPRESSION: Reticular opacity at the left lung base, concerning for pneumonia given the history of fever. Redemonstration of destructive left ninth rib lesion. Correlation with contrast-enhanced CT may be useful, as the findings are compatible with malignancy. Electronically Signed   By: Corrie Mckusick D.O.   On: 10/06/2018 17:20        Scheduled Meds: . sodium chloride   Intravenous Once  . darbepoetin (ARANESP) injection - NON-DIALYSIS  300 mcg Subcutaneous Once  . heparin  5,000 Units Subcutaneous Q8H   Continuous Infusions: . sodium chloride 75 mL/hr at 10/08/18 0411  . ceFEPime (MAXIPIME) IV Stopped (10/07/18 1829)  . famotidine (PEPCID) IV 40 mg (10/08/18 0900)  . IMMUNE GLOBULIN 10% (HUMAN) IV - For Fluid Restriction Only       LOS: 2 days     Georgette Shell, MD [facility]  If 7PM-7AM, please contact night-coverage [email or web address] Password [facility] 10/08/2018, 9:11 AM

## 2018-10-08 NOTE — Progress Notes (Signed)
    [facility] has been requested to perform a transesophageal echocardiogram on Christian Burns for bacteremia.  After careful review of history and examination, the risks and benefits of transesophageal echocardiogram have been explained including risks of esophageal damage, perforation (1:10,000 risk), bleeding, pharyngeal hematoma as well as other potential complications associated with conscious sedation including aspiration, arrhythmia, respiratory failure and death. Alternatives to treatment were discussed, questions were answered. Patient is willing to proceed.   Pt scheduled for TEE tomorrow 10/09/18 at 1345 with Dr. Acie Fredrickson. NPO at [place]. Please arrange [facility].  Tami Lin [place], [place]  10/08/2018 11:03 AM

## 2018-10-08 NOTE — Progress Notes (Signed)
PHARMACY NOTE:  ANTIMICROBIAL RENAL DOSAGE ADJUSTMENT  Current antimicrobial regimen includes a mismatch between antimicrobial dosage and estimated renal function.  As per policy approved by the Pharmacy & Therapeutics and Medical Executive Committees, the antimicrobial dosage will be adjusted accordingly.  Current antimicrobial dosage:  Cefepime 1gm IV q24h x 8 days  Indication: PNA  Renal Function:  Estimated Creatinine Clearance: 35 mL/min (A) (by C-G formula based on SCr of 1.39 mg/dL (H)).    Antimicrobial dosage has been changed to:  Cefepime 1gm IV q12h x 8 days ( total )    Thank you for allowing pharmacy to be a part of this patient's care.  Royetta Asal, PharmD, BCPS Pager [phone] 10/08/2018 1:32 PM

## 2018-10-08 NOTE — Consult Note (Signed)
Referral MD  Reason for Referral: Left lower lobe pneumonia; IgG kappa myeloma  Chief Complaint  Patient presents with  . Fever  : I had a temperature.  HPI: Christian Burns is well-known to me.  He is very nice 85 year old white male.  He has a long history of IgG kappa myeloma.  He has been on therapy for this.  He recently underwent hip repair for an impending right hip fracture.  He got through surgery quite nicely.  He was admitted 2 days ago with a temperature and chills at home.  A chest x-ray showed an infiltrate in the left lower lung.  On admission, his BUN was 31 creatinine 1.60.  His total protein was 6.1.  His calcium was 7.7 with an albumin of 2.7.  His white cell count was 10.8.  Hemoglobin 8.5.  Platelet count 198,000.  His CBC today shows a white cell count of 8.1.  Hemoglobin 6.9.  Platelet count 151,000.  I suspect that he is going to need 2 units of blood.  He does have some anemia is associated with his myeloma and also some renal insufficiency.  I have given him Aranesp in the office.  I will give him a dose of Aranesp today.  However, I think if he was to go home soon, blood would really make the big difference with him.  I am going to give him a dose of IVIG.  I think his immune system is compromised because of the myeloma.  I think he has abnormal immunoglobulins that just do not function right.  He has lost a little bit of weight.  His appetite comes and goes.  He has had no bleeding.  He has had no diarrhea.  He has had no nausea or vomiting.  Overall, his performance status is ECOG 2.   Past Medical History:  Diagnosis Date  . CKD (chronic kidney disease) 07/06/2018   CKD stage III  . History of radiation therapy 06/17/13-07/06/13   35 gray to upper lumbar spine  . 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 Hypertension   . Inguinal hernia   . Multiple myeloma [facility]) Sept 2010  . Multiple myeloma in relapse ([place]) 04/05/2016  . Radiation 09/26/15-10/20/15   lower thoracic spine, upper lumbar spine 30 gray  :  Past Surgical History:  Procedure Laterality Date  . COLONOSCOPY    . HIP PINNING,CANNULATED Right 09/01/2018   Procedure: [facility] NA[facility] HIP PINNING;  Surgeon: Melrose Nakayama, MD;  Location: [place];  Service: Orthopedics;  Laterality: Right;  . TONSILLECTOMY    :   Current Facility-Administered Medications:  .  0.9 %  sodium chloride infusion, , Intravenous, Continuous, Allie Bossier, MD, Last Rate: 75 mL/hr at 10/08/18 0411 .  ceFEPIme (MAXIPIME) 1 g in sodium chloride 0.9 % 100 mL IVPB, 1 g, Intravenous, Q24H, Poindexter, Leann T, RPH, Stopped at 10/07/18 1829 .  Darbepoetin Alfa (ARANESP) injection 300 mcg, 300 mcg, Subcutaneous, Once, Ennever, Rudell Cobb, MD .  famotidine (PEPCID) 40 mg in sodium chloride 0.9 % 50 mL IVPB, 40 mg, Intravenous, Once, Ennever, Rudell Cobb, MD .  heparin injection 5,000 Units, 5,000 Units, Subcutaneous, Q8H, Allie Bossier, MD, 5,000 Units at 10/08/18 [phone] .  Immune Globulin 10% (PRIVIGEN) IV infusion 40 g, 40 g, Intravenous, Q24H, Ennever, Peter R, MD .  ipratropium-albuterol (DUONEB) 0.5-2.5 (3) MG/3ML nebulizer solution 3 mL, 3 mL, Nebulization, Q6H PRN, Allie Bossier, MD .  methylPREDNISolone sodium succinate (SOLU-MEDROL) 125 mg/2 mL  injection 80 mg, 80 mg, Intravenous, Once, Ennever, Rudell Cobb, MD:  . darbepoetin (ARANESP) injection - NON-DIALYSIS  300 mcg Subcutaneous Once  . heparin  5,000 Units Subcutaneous Q8H  . methylPREDNISolone (SOLU-MEDROL) injection  80 mg Intravenous Once  :  Allergies  Allergen Reactions  . Sulfa Antibiotics Itching  . Sulfasalazine Itching  :  History reviewed. No pertinent family history.:  Social History   Socioeconomic History  . Marital status: Married    Spouse name: Not on file  . Number of children: Not on file  . Years of education: Not on file  . Highest education level: Not on file  Occupational History  . Not on file  Social Needs  . Financial  resource strain: Not on file  . Food insecurity:    Worry: Not on file    Inability: Not on file  . Transportation needs:    Medical: Not on file    Non-medical: Not on file  Tobacco Use  . Smoking status: Never Smoker  . Smokeless tobacco: Never Used  . Tobacco comment: never used tobacco  Substance and Sexual Activity  . Alcohol use: Yes    Alcohol/week: 4.0 standard drinks    Types: 4 Glasses of wine per week  . Drug use: No  . Sexual activity: Not on file  Lifestyle  . Physical activity:    Days per week: Not on file    Minutes per session: Not on file  . Stress: Not on file  Relationships  . Social connections:    Talks on phone: Not on file    Gets together: Not on file    Attends religious service: Not on file    Active member of club or organization: Not on file    Attends meetings of clubs or organizations: Not on file    Relationship status: Not on file  . Intimate partner violence:    Fear of current or ex partner: Not on file    Emotionally abused: Not on file    Physically abused: Not on file    Forced sexual activity: Not on file  Other Topics Concern  . Not on file  Social History Narrative  . Not on file  :  Review of Systems  Constitutional: Positive for malaise/fatigue and weight loss.  HENT: Negative.   Eyes: Negative.   Respiratory: Positive for cough.   Cardiovascular: Negative.   Gastrointestinal: Positive for abdominal pain.  Genitourinary: Negative.   Musculoskeletal: Positive for back pain and myalgias.  Skin: Negative.   Neurological: Negative.   Endo/Heme/Allergies: Negative.   Psychiatric/Behavioral: Negative.      Exam: Elderly, thin white male in no obvious distress.  Head neck exam shows no ocular or oral lesions.  His conjunctiva are pale.  He has no adenopathy in the neck.  Lungs are relatively clear bilaterally.  Cardiac exam regular rate and rhythm with no murmurs, rubs or bruits.  Abdomen is soft.  He has good bowel sounds.   Extremities shows the surgical site in the right hip.  This is healing.  He has no edema in the legs.  He has some osteoarthritic changes in his joints.  Skin exam shows no rashes.  Neurological exam is nonfocal. Patient Vitals for the past 24 hrs:  BP Temp Temp src Pulse Resp SpO2  10/08/18 0420 (!) 144/71 98.3 F (36.8 C) Oral 84 17 94 %  10/07/18 2111 133/71 98.9 F (37.2 C) Oral 82 15 100 %  Recent Labs    10/07/18 0319 10/08/18 0556  WBC 11.9* 8.1  HGB 7.3* 6.9*  HCT 22.8* 22.5*  PLT 161 151   Recent Labs    10/07/18 0319 10/08/18 0556  NA 139 139  K 4.3 3.7  CL 118* 117*  CO2 18* 18*  GLUCOSE 109* 95  BUN 29* 21  CREATININE 1.44* 1.39*  CALCIUM 7.8* 7.8*    Blood smear review: None  Pathology: None    Assessment and Plan: Christian Burns is an 85 year old white male with IgG kappa myeloma.  He was started monoclonal antibody therapy this week.  He is in the hospital with pneumonia.  We are going to have to put treatment off for a week or so.  My concern right now is the pneumonia.  I will give a dose of IVIG.  Again, I really think that he is going need to be transfused.  I have not written the orders for blood.  His lab work had not come back when I had seen him this morning.  I think hemoglobin 6.9 is quite low for him and that he would not do well with this hemoglobin.  I think would add a hardship to him with respect to him going home.  I think would delay the healing of his right hip repair.  I think 2 units of blood would be reasonable for him.  I will give him a dose of 40 g of IVIG.  Again, I think the blood transfusion is critical for him.  I would not recommend him going home until after he has been transfused.  Hopefully, he will be able to go home tomorrow.  I know he wants to go home.  His wife is debilitated.  He has done a wonderful job in trying to care for her.  Thankfully they are at a assisted living center.  I appreciate all the great care  that he is getting while the staff on 5 E.  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  Lattie Haw, MD  [address] [place] 4:14

## 2018-10-09 ENCOUNTER — Encounter (HOSPITAL_COMMUNITY): Payer: Self-pay | Admitting: *Deleted

## 2018-10-09 ENCOUNTER — Inpatient Hospital Stay (HOSPITAL_COMMUNITY): Payer: Medicare HMO

## 2018-10-09 ENCOUNTER — Encounter (HOSPITAL_COMMUNITY)
Admission: EM | Disposition: A | Discharge status: Home Health | Payer: Self-pay | Source: Skilled Nursing Facility | Attending: Internal Medicine

## 2018-10-09 DIAGNOSIS — R7881 Bacteremia: Secondary | ICD-10-CM

## 2018-10-09 DIAGNOSIS — I340 Nonrheumatic mitral (valve) insufficiency: Secondary | ICD-10-CM

## 2018-10-09 HISTORY — PX: TEE WITHOUT CARDIOVERSION: SHX5443

## 2018-10-09 LAB — CBC WITH DIFFERENTIAL/PLATELET
Abs Immature Granulocytes: 0.07 10*3/uL (ref 0.00–0.07)
BASOS ABS: 0 10*3/uL (ref 0.0–0.1)
Basophils Relative: 0 %
Eosinophils Absolute: 0 10*3/uL (ref 0.0–0.5)
Eosinophils Relative: 0 %
HEMATOCRIT: 27.5 % — AB (ref 39.0–52.0)
Hemoglobin: 8.6 g/dL — ABNORMAL LOW (ref 13.0–17.0)
Immature Granulocytes: 1 %
Lymphocytes Relative: 4 %
Lymphs Abs: 0.3 10*3/uL — ABNORMAL LOW (ref 0.7–4.0)
MCH: 30.4 pg (ref 26.0–34.0)
MCHC: 31.3 g/dL (ref 30.0–36.0)
MCV: 97.2 fL (ref 80.0–100.0)
Monocytes Absolute: 0.7 10*3/uL (ref 0.1–1.0)
Monocytes Relative: 9 %
NRBC: 0 % (ref 0.0–0.2)
Neutro Abs: 7 10*3/uL (ref 1.7–7.7)
Neutrophils Relative %: 86 %
Platelets: 169 10*3/uL (ref 150–400)
RBC: 2.83 MIL/uL — ABNORMAL LOW (ref 4.22–5.81)
RDW: 13.8 % (ref 11.5–15.5)
WBC: 8.2 10*3/uL (ref 4.0–10.5)

## 2018-10-09 LAB — TYPE AND SCREEN
ABO/RH(D): O POS
ANTIBODY SCREEN: NEGATIVE
Unit division: 0
Unit division: 0

## 2018-10-09 LAB — BPAM RBC
Blood Product Expiration Date: 202003302359
Blood Product Expiration Date: 202003312359
ISSUE DATE / TIME: 202002271018
ISSUE DATE / TIME: 202002272018
Unit Type and Rh: 5100
Unit Type and Rh: 5100

## 2018-10-09 LAB — COMPREHENSIVE METABOLIC PANEL
ALT: 19 U/L (ref 0–44)
AST: 34 U/L (ref 15–41)
Albumin: 2.5 g/dL — ABNORMAL LOW (ref 3.5–5.0)
Alkaline Phosphatase: 84 U/L (ref 38–126)
Anion gap: 4 — ABNORMAL LOW (ref 5–15)
BILIRUBIN TOTAL: 0.7 mg/dL (ref 0.3–1.2)
BUN: 25 mg/dL — ABNORMAL HIGH (ref 8–23)
CALCIUM: 7.8 mg/dL — AB (ref 8.9–10.3)
CO2: 18 mmol/L — ABNORMAL LOW (ref 22–32)
CREATININE: 1.41 mg/dL — AB (ref 0.61–1.24)
Chloride: 117 mmol/L — ABNORMAL HIGH (ref 98–111)
GFR calc Af Amer: 52 mL/min — ABNORMAL LOW (ref >60)
GFR, EST NON AFRICAN AMERICAN: 45 mL/min — AB (ref >60)
Glucose, Bld: 108 mg/dL — ABNORMAL HIGH (ref 70–99)
Potassium: 3.7 mmol/L (ref 3.5–5.1)
Sodium: 139 mmol/L (ref 135–145)
TOTAL PROTEIN: 6.4 g/dL — AB (ref 6.5–8.1)

## 2018-10-09 LAB — LEGIONELLA PNEUMOPHILA SEROGP 1 UR AG: L. pneumophila Serogp 1 Ur Ag: NEGATIVE

## 2018-10-09 SURGERY — ECHOCARDIOGRAM, TRANSESOPHAGEAL
Anesthesia: Moderate Sedation

## 2018-10-09 MED ORDER — MIDAZOLAM HCL (PF) 5 MG/ML IJ SOLN
INTRAMUSCULAR | Status: AC
  Filled 2018-10-09: qty 2

## 2018-10-09 MED ORDER — FENTANYL CITRATE (PF) 100 MCG/2ML IJ SOLN
INTRAMUSCULAR | Status: DC | PRN
  Administered 2018-10-09 (×2): 25 ug via INTRAVENOUS

## 2018-10-09 MED ORDER — SODIUM CHLORIDE 0.9 % IV SOLN: INTRAVENOUS | Status: DC

## 2018-10-09 MED ORDER — FENTANYL CITRATE (PF) 100 MCG/2ML IJ SOLN
INTRAMUSCULAR | Status: AC
  Filled 2018-10-09: qty 2

## 2018-10-09 MED ORDER — MIDAZOLAM HCL (PF) 10 MG/2ML IJ SOLN
INTRAMUSCULAR | Status: DC | PRN
  Administered 2018-10-09: 2 mg via INTRAVENOUS
  Administered 2018-10-09 (×2): 1 mg via INTRAVENOUS

## 2018-10-09 NOTE — Progress Notes (Signed)
[name] does look a lot better.  His hemoglobin is up to 8.6.  He got 2 units of blood yesterday I very much appreciate the hospitalist ordering this for him.  He did receive a dose of Aranesp yesterday.  He got IVIG yesterday.  He just needs the 1 dose.  He is going for a transesophageal echocardiogram.  I think this probably is a good idea.  His electrolytes look good.  His creatinine is 1.41.  His potassium is 3.7.  His liver tests look fine.  He says that he ate well yesterday.  There is no diarrhea.  His physical exam shows a temperature of 97.7.  Pulse is 70 blood pressure 113/68.  His oral exam shows no mucositis.  There is no thrush.  Lungs are clear bilaterally.  Cardiac exam regular rate and rhythm.  He has a 1/6 systolic murmur.  Abdomen is soft.  He has decent bowel sounds.  There is no abdominal mass.  He has no palpable liver or spleen tip.  His right hip looks incredible after his surgery.  His surgical scars are healed nicely.  Neurological exam is non-focal.  Hopefully, [name] will be able to go home today or tomorrow.  Looks like he is not had any fever.  Looks like his breathing is doing pretty well.  I am sure he can be put on oral antibiotics.  I know that he is gotten fantastic care from everybody up on 5 E.  Lattie Haw, MD  [place] 6:9

## 2018-10-09 NOTE — CV Procedure (Signed)
    Transesophageal Echocardiogram Note  Christian Burns [id number] 11-Dec-1929  Procedure: Transesophageal Echocardiogram Indications: bacteremia   Procedure Details Consent: Obtained Time Out: Verified patient identification, verified procedure, site/side was marked, verified correct patient position, special equipment/implants available, Radiology Safety Procedures followed,  medications/allergies/relevent history reviewed, required imaging and test results available.  Performed  Medications:  During this procedure the patient is administered a total of Versed 4 mg  mg and Fentanyl 50 mcg  mcg  to achieve and maintain moderate conscious sedation.  The patient's heart rate, blood pressure, and oxygen saturation are monitored continuously during the procedure. The period of conscious sedation is 30  minutes, of which I was present face-to-face 100% of this time.  Left Ventrical:  Normal LV function   Mitral Valve: normal ,  No vegetation   Aortic Valve: normal AV, no vegetation   Tricuspid Valve: normal , no vegetation   Pulmonic Valve: normla   Left Atrium/ Left atrial appendage: no thrombi in LAA  Atrial septum: there is a small PFO with left to right flow by color ,  No right to left shunting by bubble study   Aorta: normal    Complications: No apparent complications Patient did tolerate procedure well.   Thayer Headings, Brooke Bonito., MD, [facility] 10/09/2018, 3:37 PM

## 2018-10-09 NOTE — Progress Notes (Signed)
PROGRESS NOTE    Christian Burns  DDU:[id number] DOB: [birth year] DOA: 10/06/2018 PCP: Leanna Battles, MD   Brief Narrative:85 y.o.WM PMHxmultiple myeloma treated by Dr. Ennever(oncology):, CKD stageIII. Scheduled to start new treatment on Thursday.S/p right hip THA 5 weeks ago. Left shoulder pain negative radiation started today (EKG sinus rhythm) nonreproducible with pressure. Patient states negative S OB over the last couple days however felt as if he was going to pass out (presyncope?).   Assessment & Plan:   Active Problems:   Multiple myeloma in relapse [facility])   Right hip pain   Left lower lobe pneumonia ([place])   History of total right hip replacement   Sepsis due to pneumonia ([place])   1) HCAP LUL Pneumonia-patient continues to have cough and shortness of breath. His T-max 102.1. He is immunocompromised due to multiple myeloma. Continue IV antibiotics and DuoNeb. Waiting on PT evaluation for discharge plans. Influenza a and B-.Urine strep pneumo negative.  2)multiple myeloma in relapse oncology input.  Patient did receive IVIG yesterday. Holding off on any further monoclonal antibody until his out of the hospital and improved.  3)stage III CKD with hydration and monitor creatinine.  Improving with IV hydration.  4)atypical chest pain left shoulder pain patient denies any chest pain at this time. Troponin 0 0.03. Echocardiogram ejection fraction 60 to 65%.  A tiny mobile density on the atrial side of the mitral valve leaflet may represent a ruptured chordae tendineae but cannot rule out vegetation.  Patient to go for TEE today.   5)status post right hip total hip arthroplasty patient usesa walker at home. PT OT consult.  6] anemia secondary to #2 and #3 hemoglobin 6.9 today will transfuse 2 units of packed RBC.  He received Aranesp yesterday.     Estimated body mass index is 20.52 kg/m as calculated from the following:   Height as of this encounter:  _0  (1.778 m).   Weight as of this encounter: 64.9 kg.     Subjective: Patient resting in bed complains of generalized weakness and some persistent cough going for TEE today to [facility] denies nausea vomiting diarrhea or chest pain   Objective: Vitals:   10/08/18 2032 10/08/18 2053 10/08/18 2338 10/09/18 0616  BP: (!) 149/95 140/81 (!) 143/88 113/68  Pulse: 80 74 64 70  Resp: _1 Temp: 98.1 F (36.7 C) 97.9 F (36.6 C) 98.2 F (36.8 C) 97.7 F (36.5 C)  TempSrc: Oral Oral Oral Oral  SpO2: 97% 98% 98% 96%  Weight:      Height:        Intake/Output Summary (Last 24 hours) at 10/09/2018 0835 Last data filed at 10/09/2018 0700 Gross per 24 hour  Intake 837 ml  Output 550 ml  Net 287 ml   Filed Weights   10/06/18 1536  Weight: 64.9 kg    Examination: Elderly male in no acute distress oral mucosa dry n.p.o. for the TEE General exam: Appears calm and comfortable  Respiratory system: Scattered rhonchi to auscultation. Respiratory effort normal. Cardiovascular system: S1 & S2 heard, RRR. No JVD, murmurs, rubs, gallops or clicks. No pedal edema. Gastrointestinal system: Abdomen is nondistended, soft and nontender. No organomegaly or masses felt. Normal bowel sounds heard. Central nervous system: Alert and oriented. No focal neurological deficits. Extremities: Symmetric 5 x 5 power. Skin: No rashes, lesions or ulcers Psychiatry: Judgement and insight appear normal. Mood & affect appropriate.     Data Reviewed: I have personally reviewed following  labs and imaging studies  CBC: Recent Labs  Lab 10/06/18 1625 10/06/18 2142 10/07/18 0319 10/08/18 0556 10/09/18 0550  WBC 10.8* 12.8* 11.9* 8.1 8.2  NEUTROABS 9.7*  --  11.7*  --  7.0  HGB 8.5* 7.4* 7.3* 6.9* 8.6*  HCT 26.3* 24.1* 22.8* 22.5* 27.5*  MCV 96.3 99.2 98.7 100.4* 97.2  PLT 198 174 161 151 756   Basic Metabolic Panel: Recent Labs  Lab 10/06/18 1625 10/06/18 2142 10/07/18 0319 10/08/18 0556  10/09/18 0550  NA 135 137 139 139 139  K 4.9 4.6 4.3 3.7 3.7  CL 111 115* 118* 117* 117*  CO2 18* 16* 18* 18* 18*  GLUCOSE 196* 221* 109* 95 108*  BUN 36* 31* 29* 21 25*  CREATININE 1.67* 1.62* 1.44* 1.39* 1.41*  CALCIUM 8.5* 7.7* 7.8* 7.8* 7.8*   GFR: Estimated Creatinine Clearance: 34.5 mL/min (A) (by C-G formula based on SCr of 1.41 mg/dL (H)). Liver Function Tests: Recent Labs  Lab 10/06/18 1625 10/06/18 2142 10/07/18 0319 10/08/18 0556 10/09/18 0550  AST 29 47* 50* 41 34  ALT _0 ALKPHOS 124 102 100 87 84  BILITOT 0.6 0.6 0.7 0.6 0.7  PROT 6.8 6.1* 6.0* 5.6* 6.4*  ALBUMIN 3.1* 2.7* 2.6* 2.4* 2.5*   No results for input(s): LIPASE, AMYLASE in the last 168 hours. No results for input(s): AMMONIA in the last 168 hours. Coagulation Profile: Recent Labs  Lab 10/06/18 1625  INR 1.1   Cardiac Enzymes: Recent Labs  Lab 10/06/18 2142 10/07/18 0319 10/07/18 0909  TROPONINI 0.03* <0.03 0.03*   BNP (last 3 results) No results for input(s): PROBNP in the last 8760 hours. HbA1C: No results for input(s): HGBA1C in the last 72 hours. CBG: No results for input(s): GLUCAP in the last 168 hours. Lipid Profile: No results for input(s): CHOL, HDL, LDLCALC, TRIG, CHOLHDL, LDLDIRECT in the last 72 hours. Thyroid Function Tests: No results for input(s): TSH, T4TOTAL, FREET4, T3FREE, THYROIDAB in the last 72 hours. Anemia Panel: No results for input(s): VITAMINB12, FOLATE, FERRITIN, TIBC, IRON, RETICCTPCT in the last 72 hours. Sepsis Labs: Recent Labs  Lab 10/06/18 1625 10/06/18 1827  LATICACIDVEN 2.1* 1.2    Recent Results (from the past 240 hour(s))  Culture, blood (Routine x 2)     Status: None (Preliminary result)   Collection Time: 10/06/18  4:25 PM  Result Value Ref Range Status   Specimen Description   Final    BLOOD RIGHT ANTECUBITAL Performed at [facility] [address], [place], [place] [address]    Special Requests    Final    BOTTLES DRAWN AEROBIC AND ANAEROBIC Blood Culture adequate volume Performed at [facility] [address], [place], [place] [address]    Culture   Final    NO GROWTH 2 DAYS Performed at [facility] [address], [place], [place] [address]    Report Status PENDING  Incomplete  Culture, blood (Routine x 2)     Status: None (Preliminary result)   Collection Time: 10/06/18  4:27 PM  Result Value Ref Range Status   Specimen Description   Final    BLOOD RIGHT FOREARM Performed at [facility] [address], [place], [place] [address]    Special Requests   Final    BOTTLES DRAWN AEROBIC AND ANAEROBIC Blood Culture adequate volume Performed at [facility] [address] [place], [place] [address]    Culture  Final    NO GROWTH 2 DAYS Performed at [facility], [place] [address] [place], [place] [address]    Report Status PENDING  Incomplete  Urine culture     Status: None   Collection Time: 10/06/18  6:20 PM  Result Value Ref Range Status   Specimen Description   Final    URINE, CLEAN CATCH Performed at [facility], [place] [address], [place], [place] [address]    Special Requests   Final    NONE Performed at [facility], [place] [address] [place], [place] [address]    Culture   Final    NO GROWTH Performed at [facility], [place] [address] [place], [place] [address]    Report Status 10/08/2018 FINAL  Final  MRSA PCR Screening     Status: None   Collection Time: 10/07/18  9:17 AM  Result Value Ref Range Status   MRSA by PCR NEGATIVE NEGATIVE Final    Comment:        The GeneXpert MRSA Assay (FDA approved for NASAL specimens only), is one component of a comprehensive MRSA colonization surveillance program. It is not intended to diagnose MRSA infection nor to guide or monitor treatment for MRSA infections. Performed at [facility], [place] [address], [place], [place] [address]          Radiology Studies: No results found.      Scheduled Meds: . sodium chloride   Intravenous Once  . heparin  5,000 Units Subcutaneous Q8H   Continuous Infusions: . sodium chloride 75 mL/hr at 10/09/18 0325  . ceFEPime (MAXIPIME) IV 1 g (10/09/18 0327)     LOS: 3 days      Georgette Shell, MD [facility] If 7PM-7AM, please contact night-coverage [email or web address] Password [facility] 10/09/2018, 8:35 AM

## 2018-10-09 NOTE — Care Management Important Message (Signed)
Important Message  Patient Details  Name: LEMMIE VANLANEN MRN: [id number] Date of Birth: August 17, 1929   Medicare Important Message Given:  Yes    Kerin Salen 10/09/2018, 11:24 AMImportant Message  Patient Details  Name: KENT RIENDEAU MRN: [id number] Date of Birth: 09-01-1929   Medicare Important Message Given:  Yes    Kerin Salen 10/09/2018, 11:24 AM

## 2018-10-09 NOTE — Progress Notes (Signed)
Patient was transferred to [facility] for TEE procedure by [facility].

## 2018-10-09 NOTE — H&P (View-Only) (Signed)
PROGRESS NOTE    Christian Burns  DDU:[id number] DOB: [birth year] DOA: 10/06/2018 PCP: Leanna Battles, MD   Brief Narrative:85 y.o.WM PMHxmultiple myeloma treated by Dr. Ennever(oncology):, CKD stageIII. Scheduled to start new treatment on Thursday.S/p right hip THA 5 weeks ago. Left shoulder pain negative radiation started today (EKG sinus rhythm) nonreproducible with pressure. Patient states negative S OB over the last couple days however felt as if he was going to pass out (presyncope?).   Assessment & Plan:   Active Problems:   Multiple myeloma in relapse [facility])   Right hip pain   Left lower lobe pneumonia ([place])   History of total right hip replacement   Sepsis due to pneumonia ([place])   1) HCAP LUL Pneumonia-patient continues to have cough and shortness of breath. His T-max 102.1. He is immunocompromised due to multiple myeloma. Continue IV antibiotics and DuoNeb. Waiting on PT evaluation for discharge plans. Influenza a and B-.Urine strep pneumo negative.  2)multiple myeloma in relapse oncology input.  Patient did receive IVIG yesterday. Holding off on any further monoclonal antibody until his out of the hospital and improved.  3)stage III CKD with hydration and monitor creatinine.  Improving with IV hydration.  4)atypical chest pain left shoulder pain patient denies any chest pain at this time. Troponin 0 0.03. Echocardiogram ejection fraction 60 to 65%.  A tiny mobile density on the atrial side of the mitral valve leaflet may represent a ruptured chordae tendineae but cannot rule out vegetation.  Patient to go for TEE today.   5)status post right hip total hip arthroplasty patient usesa walker at home. PT OT consult.  6] anemia secondary to #2 and #3 hemoglobin 6.9 today will transfuse 2 units of packed RBC.  He received Aranesp yesterday.     Estimated body mass index is 20.52 kg/m as calculated from the following:   Height as of this encounter:  _0  (1.778 m).   Weight as of this encounter: 64.9 kg.     Subjective: Patient resting in bed complains of generalized weakness and some persistent cough going for TEE today to [facility] denies nausea vomiting diarrhea or chest pain   Objective: Vitals:   10/08/18 2032 10/08/18 2053 10/08/18 2338 10/09/18 0616  BP: (!) 149/95 140/81 (!) 143/88 113/68  Pulse: 80 74 64 70  Resp: _1 Temp: 98.1 F (36.7 C) 97.9 F (36.6 C) 98.2 F (36.8 C) 97.7 F (36.5 C)  TempSrc: Oral Oral Oral Oral  SpO2: 97% 98% 98% 96%  Weight:      Height:        Intake/Output Summary (Last 24 hours) at 10/09/2018 0835 Last data filed at 10/09/2018 0700 Gross per 24 hour  Intake 837 ml  Output 550 ml  Net 287 ml   Filed Weights   10/06/18 1536  Weight: 64.9 kg    Examination: Elderly male in no acute distress oral mucosa dry n.p.o. for the TEE General exam: Appears calm and comfortable  Respiratory system: Scattered rhonchi to auscultation. Respiratory effort normal. Cardiovascular system: S1 & S2 heard, RRR. No JVD, murmurs, rubs, gallops or clicks. No pedal edema. Gastrointestinal system: Abdomen is nondistended, soft and nontender. No organomegaly or masses felt. Normal bowel sounds heard. Central nervous system: Alert and oriented. No focal neurological deficits. Extremities: Symmetric 5 x 5 power. Skin: No rashes, lesions or ulcers Psychiatry: Judgement and insight appear normal. Mood & affect appropriate.     Data Reviewed: I have personally reviewed following  labs and imaging studies  CBC: Recent Labs  Lab 10/06/18 1625 10/06/18 2142 10/07/18 0319 10/08/18 0556 10/09/18 0550  WBC 10.8* 12.8* 11.9* 8.1 8.2  NEUTROABS 9.7*  --  11.7*  --  7.0  HGB 8.5* 7.4* 7.3* 6.9* 8.6*  HCT 26.3* 24.1* 22.8* 22.5* 27.5*  MCV 96.3 99.2 98.7 100.4* 97.2  PLT 198 174 161 151 756   Basic Metabolic Panel: Recent Labs  Lab 10/06/18 1625 10/06/18 2142 10/07/18 0319 10/08/18 0556  10/09/18 0550  NA 135 137 139 139 139  K 4.9 4.6 4.3 3.7 3.7  CL 111 115* 118* 117* 117*  CO2 18* 16* 18* 18* 18*  GLUCOSE 196* 221* 109* 95 108*  BUN 36* 31* 29* 21 25*  CREATININE 1.67* 1.62* 1.44* 1.39* 1.41*  CALCIUM 8.5* 7.7* 7.8* 7.8* 7.8*   GFR: Estimated Creatinine Clearance: 34.5 mL/min (A) (by C-G formula based on SCr of 1.41 mg/dL (H)). Liver Function Tests: Recent Labs  Lab 10/06/18 1625 10/06/18 2142 10/07/18 0319 10/08/18 0556 10/09/18 0550  AST 29 47* 50* 41 34  ALT _0 ALKPHOS 124 102 100 87 84  BILITOT 0.6 0.6 0.7 0.6 0.7  PROT 6.8 6.1* 6.0* 5.6* 6.4*  ALBUMIN 3.1* 2.7* 2.6* 2.4* 2.5*   No results for input(s): LIPASE, AMYLASE in the last 168 hours. No results for input(s): AMMONIA in the last 168 hours. Coagulation Profile: Recent Labs  Lab 10/06/18 1625  INR 1.1   Cardiac Enzymes: Recent Labs  Lab 10/06/18 2142 10/07/18 0319 10/07/18 0909  TROPONINI 0.03* <0.03 0.03*   BNP (last 3 results) No results for input(s): PROBNP in the last 8760 hours. HbA1C: No results for input(s): HGBA1C in the last 72 hours. CBG: No results for input(s): GLUCAP in the last 168 hours. Lipid Profile: No results for input(s): CHOL, HDL, LDLCALC, TRIG, CHOLHDL, LDLDIRECT in the last 72 hours. Thyroid Function Tests: No results for input(s): TSH, T4TOTAL, FREET4, T3FREE, THYROIDAB in the last 72 hours. Anemia Panel: No results for input(s): VITAMINB12, FOLATE, FERRITIN, TIBC, IRON, RETICCTPCT in the last 72 hours. Sepsis Labs: Recent Labs  Lab 10/06/18 1625 10/06/18 1827  LATICACIDVEN 2.1* 1.2    Recent Results (from the past 240 hour(s))  Culture, blood (Routine x 2)     Status: None (Preliminary result)   Collection Time: 10/06/18  4:25 PM  Result Value Ref Range Status   Specimen Description   Final    BLOOD RIGHT ANTECUBITAL Performed at [facility] [address], [place], [place] [address]    Special Requests    Final    BOTTLES DRAWN AEROBIC AND ANAEROBIC Blood Culture adequate volume Performed at [facility] [address], [place], [place] [address]    Culture   Final    NO GROWTH 2 DAYS Performed at [facility] [address], [place], [place] [address]    Report Status PENDING  Incomplete  Culture, blood (Routine x 2)     Status: None (Preliminary result)   Collection Time: 10/06/18  4:27 PM  Result Value Ref Range Status   Specimen Description   Final    BLOOD RIGHT FOREARM Performed at [facility] [address], [place], [place] [address]    Special Requests   Final    BOTTLES DRAWN AEROBIC AND ANAEROBIC Blood Culture adequate volume Performed at [facility] [address] [place], [place] [address]    Culture  Final    NO GROWTH 2 DAYS Performed at [facility], [place] [address] [place], [place] [address]    Report Status PENDING  Incomplete  Urine culture     Status: None   Collection Time: 10/06/18  6:20 PM  Result Value Ref Range Status   Specimen Description   Final    URINE, CLEAN CATCH Performed at [facility], [place] [address], [place], [place] [address]    Special Requests   Final    NONE Performed at [facility], [place] [address] [place], [place] [address]    Culture   Final    NO GROWTH Performed at [facility], [place] [address] [place], [place] [address]    Report Status 10/08/2018 FINAL  Final  MRSA PCR Screening     Status: None   Collection Time: 10/07/18  9:17 AM  Result Value Ref Range Status   MRSA by PCR NEGATIVE NEGATIVE Final    Comment:        The GeneXpert MRSA Assay (FDA approved for NASAL specimens only), is one component of a comprehensive MRSA colonization surveillance program. It is not intended to diagnose MRSA infection nor to guide or monitor treatment for MRSA infections. Performed at [facility], [place] [address], [place], [place] [address]          Radiology Studies: No results found.      Scheduled Meds: . sodium chloride   Intravenous Once  . heparin  5,000 Units Subcutaneous Q8H   Continuous Infusions: . sodium chloride 75 mL/hr at 10/09/18 0325  . ceFEPime (MAXIPIME) IV 1 g (10/09/18 0327)     LOS: 3 days      Georgette Shell, MD [facility] If 7PM-7AM, please contact night-coverage [email or web address] Password [facility] 10/09/2018, 8:35 AM

## 2018-10-09 NOTE — Interval H&P Note (Signed)
History and Physical Interval Note:  10/09/2018 3:13 PM  Christian Burns  has presented today for surgery, with the diagnosis of BACTEREMIA  The various methods of treatment have been discussed with the patient and family. After consideration of risks, benefits and other options for treatment, the patient has consented to  Procedure(s): TRANSESOPHAGEAL ECHOCARDIOGRAM (TEE) (N/A) as a surgical intervention .  The patient's history has been reviewed, patient examined, no change in status, stable for surgery.  I have reviewed the patient's chart and labs.  Questions were answered to the patient's satisfaction.     [name]

## 2018-10-09 NOTE — Progress Notes (Signed)
PT Cancellation Note  Patient Details Name: Christian Burns MRN: [id number] DOB: [birth year]   Cancelled Treatment:    Reason Eval/Treat Not Completed: Patient at procedure or test/unavailable; pt at [facility] endo,continue efforts to complete PT eval   [facility] 10/09/2018, 2:09 PM

## 2018-10-10 DIAGNOSIS — R7881 Bacteremia: Secondary | ICD-10-CM

## 2018-10-10 MED ORDER — AMOXICILLIN-POT CLAVULANATE 875-125 MG PO TABS: 1.0000 | ORAL_TABLET | Freq: Two times a day (BID) | ORAL | 0 refills | Status: AC

## 2018-10-10 NOTE — Discharge Summary (Signed)
Physician Discharge Summary  Christian Burns:[id number] DOB: September 02, 1929 DOA: 10/06/2018  PCP: Leanna Battles, MD  Admit date: 10/06/2018 Discharge date: 10/10/2018  Admitted From: [facility] Home Disposition: Retirement home Recommendations for Outpatient Follow-up:  1. Follow up with PCP in 1-2 weeks 2. Please obtain BMP/CBC in one week 3. Follow-up with Dr. Marin Olp   Home Health PT Equipment/Devices none  Discharge Condition stable and improved  CODE STATUS full code Diet recommendation: Cardiac  Brief/Interim Summary:85 y.o.WM PMHxmultiple myeloma treated by Dr. Ennever(oncology):, CKD stageIII. Scheduled to start new treatment on Thursday.S/p right hip THA 5 weeks ago. Left shoulder pain negative radiation started today (EKG sinus rhythm) nonreproducible with pressure. Patient states negative S OB over the last couple days however felt as if he was going to pass out (presyncope?).   Discharge Diagnoses:  Active Problems:   Multiple myeloma in relapse [facility])   Right hip pain   Left lower lobe pneumonia ([place])   History of total right hip replacement   Sepsis due to pneumonia ([place])   Bacteremia   1) HCAP LUL Pneumonia-patient was treated with IV Maxipime switch to Augmentin upon discharge for 2 more days. Influenza a and B-.Urine strep pneumo negative.  Patient is on room air saturation above 92%.  2)multiple myeloma in relapse was seen by Dr. Marin Olp.  He received IVIG. .Holding off on any further monoclonal antibody until his out of the hospital and improved.  3)stage III CKD with hydration and monitor creatinine. Resolved with IV hydration  4)atypical chest pain left shoulder pain patient denies any chest pain at this time. Troponin 0 0.03. Echocardiogramejection fraction 60 to 65%. A tiny mobile density on the atrial side of the mitral valve leaflet may represent a ruptured chordae tendineae but cannot rule out vegetation.  A TEE was done which did  not reveal any evidence of endocarditis however a small PFO was noted with no right-to-left shunt.  5)status post right hip total hip arthroplasty patient usesa walker at home. Patient took aspirin twice a day for prophylaxis starting 09/02/2018.    6]anemia secondary to #2 and #3 hemoglobin 8.6 today patient was transfused 2 units of packed RBCs.  He also received Aranesp.   Estimated body mass index is 20.52 kg/m as calculated from the following:   Height as of this encounter: _0  (1.778 m).   Weight as of this encounter: 64.9 kg.  Discharge Instructions  Discharge Instructions    Call MD for:  difficulty breathing, headache or visual disturbances   Complete by:  As directed    Call MD for:  persistant nausea and vomiting   Complete by:  As directed    Call MD for:  redness, tenderness, or signs of infection (pain, swelling, redness, odor or green/yellow discharge around incision site)   Complete by:  As directed    Call MD for:  severe uncontrolled pain   Complete by:  As directed    Diet - low sodium heart healthy   Complete by:  As directed    Increase activity slowly   Complete by:  As directed      Allergies as of 10/10/2018      Reactions   Sulfa Antibiotics Itching   Sulfasalazine Itching      Medication List    STOP taking these medications   aspirin 325 MG EC tablet     TAKE these medications   amLODipine-benazepril 5-10 MG capsule Commonly known as:  LOTREL TAKE 1 CAPSULE BY MOUTH  DAILY   amoxicillin-clavulanate 875-125 MG tablet Commonly known as:  AUGMENTIN Take 1 tablet by mouth 2 (two) times daily for 2 days.   finasteride 5 MG tablet Commonly known as:  PROSCAR Take 5 mg by mouth daily.   montelukast 10 MG tablet Commonly known as:  SINGULAIR Take 1 tablet (10 mg total) by mouth at bedtime.   multivitamin with minerals Tabs tablet Take 1 tablet by mouth daily.   penicillin v potassium 500 MG tablet Commonly known as:  VEETID Take 1  tablet (500 mg total) by mouth QID.   pomalidomide 2 MG capsule Commonly known as:  POMALYST Take 1 capsule (2 mg total) by mouth daily. Take with water on days 1-21. Repeat every 28 days. [id number]   PROBIOTIC PO Take 1 capsule by mouth daily.   pyridOXINE 100 MG tablet Commonly known as:  VITAMIN B-6 Take 100 mg by mouth daily.   traMADol 50 MG tablet Commonly known as:  ULTRAM Take 50 mg by mouth every 8 (eight) hours as needed for pain.   vitamin B-12 500 MCG tablet Commonly known as:  CYANOCOBALAMIN Take 500 mcg by mouth daily.   Vitamin D (Ergocalciferol) 1.25 MG (50000 UT) Caps capsule Commonly known as:  DRISDOL Take 50,000 Units by mouth every Sunday.      Follow-up Information    Leanna Battles, MD Follow up.   Specialty:  Internal Medicine Contact information: [address] [place] [address] [phone]        [name], MD Follow up.   Specialty:  Oncology Contact information: [address] [place] [address] [phone]          Allergies  Allergen Reactions  . Sulfa Antibiotics Itching  . Sulfasalazine Itching    Consultations:  Oncology   Procedures/Studies: Dg Chest 2 View  Result Date: 10/06/2018 CLINICAL DATA:  85 year old male with a history of fever and chills EXAM: CHEST - 2 VIEW COMPARISON:  09/01/2018 FINDINGS: Cardiomediastinal silhouette unchanged in size and contour. No pneumothorax. No pleural effusion. Reticular opacities at the left lung base, new from the prior with partial obscuration left hemidiaphragm. No pleural effusion. Destructive soft tissue lesion of the left chest wall involving ninth rib, similar to the prior. IMPRESSION: Reticular opacity at the left lung base, concerning for pneumonia given the history of fever. Redemonstration of destructive left ninth rib lesion. Correlation with contrast-enhanced CT may be useful, as the findings are compatible with malignancy. Electronically  Signed   By: Corrie Mckusick D.O.   On: 10/06/2018 17:20   (Echo, Carotid, EGD, Colonoscopy, ERCP)    Subjective:  Patient resting in bed awake alert anxious to go home denies any complaints of chest pain shortness of breath or cough Discharge Exam: Vitals:   10/09/18 2025 10/10/18 0522  BP: 139/69 (!) 152/78  Pulse: 64 63  Resp: 16 16  Temp: 98.1 F (36.7 C) 98.2 F (36.8 C)  SpO2: 98% 96%   Vitals:   10/09/18 1550 10/09/18 1631 10/09/18 2025 10/10/18 0522  BP: (!) 129/59 131/71 139/69 (!) 152/78  Pulse: 68 (!) 57 64 63  Resp: _0 Temp:  97.6 F (36.4 C) 98.1 F (36.7 C) 98.2 F (36.8 C)  TempSrc:  Oral  Oral  SpO2: 96% 98% 98% 96%  Weight:      Height:        General: Pt is alert, awake, not in acute distress Cardiovascular: RRR, S1/S2 +, no rubs, no  gallops Respiratory: CTA bilaterally, no wheezing, no rhonchi Abdominal: Soft, NT, ND, bowel sounds + Extremities: no edema, no cyanosis    The results of significant diagnostics from this hospitalization (including imaging, microbiology, ancillary and laboratory) are listed below for reference.     Microbiology: Recent Results (from the past 240 hour(s))  Culture, blood (Routine x 2)     Status: None (Preliminary result)   Collection Time: 10/06/18  4:25 PM  Result Value Ref Range Status   Specimen Description BLOOD RIGHT ANTECUBITAL  Final   Special Requests   Final    BOTTLES DRAWN AEROBIC AND ANAEROBIC Blood Culture adequate volume Performed at [facility], [place] [address] [place], [place] [address]    Culture NO GROWTH 3 DAYS  Final   Report Status PENDING  Incomplete  Culture, blood (Routine x 2)     Status: None (Preliminary result)   Collection Time: 10/06/18  4:27 PM  Result Value Ref Range Status   Specimen Description BLOOD RIGHT FOREARM  Final   Special Requests   Final    BOTTLES DRAWN AEROBIC AND ANAEROBIC Blood Culture adequate volume Performed at [facility] [address], [place], [place] [address]    Culture NO GROWTH 3 DAYS  Final   Report Status PENDING  Incomplete  Urine culture     Status: None   Collection Time: 10/06/18  6:20 PM  Result Value Ref Range Status   Specimen Description   Final    URINE, CLEAN CATCH Performed at [facility], [place] [address] [place], [place] [address]    Special Requests   Final    NONE Performed at [facility], [place] [address], [place],  [address]    Culture   Final    NO GROWTH Performed at [facility], [place] [address], [place],  [address]    Report Status 10/08/2018 FINAL  Final  MRSA PCR Screening     Status: None   Collection Time: 10/07/18  9:17 AM  Result Value Ref Range Status   MRSA by PCR NEGATIVE NEGATIVE Final    Comment:        The GeneXpert MRSA Assay (FDA approved for NASAL specimens only), is one component of a comprehensive MRSA colonization surveillance program. It is not intended to diagnose MRSA infection nor to guide or monitor treatment for MRSA infections. Performed at [facility], [place] [address] [place],  [address]      Labs: BNP (last 3 results) Recent Labs    10/06/18 2142  BNP 979.4*   Basic Metabolic Panel: Recent Labs  Lab 10/06/18 1625 10/06/18 2142 10/07/18 0319 10/08/18 0556 10/09/18 0550  NA 135 137 139 139 139  K 4.9 4.6 4.3 3.7 3.7  CL 111 115* 118* 117* 117*  CO2 18* 16* 18* 18* 18*  GLUCOSE 196* 221* 109* 95 108*  BUN 36* 31* 29* 21 25*  CREATININE 1.67* 1.62* 1.44* 1.39* 1.41*  CALCIUM 8.5* 7.7* 7.8* 7.8* 7.8*   Liver Function Tests: Recent Labs  Lab 10/06/18 1625 10/06/18 2142 10/07/18 0319 10/08/18 0556 10/09/18 0550  AST 29 47* 50* 41 34  ALT _0 ALKPHOS 124 102 100 87 84  BILITOT 0.6 0.6 0.7 0.6 0.7  PROT 6.8 6.1* 6.0* 5.6* 6.4*  ALBUMIN 3.1* 2.7* 2.6* 2.4* 2.5*   No results for input(s): LIPASE,  AMYLASE in the last 168 hours. No results  for input(s): AMMONIA in the last 168 hours. CBC: Recent Labs  Lab 10/06/18 1625 10/06/18 2142 10/07/18 0319 10/08/18 0556 10/09/18 0550  WBC 10.8* 12.8* 11.9* 8.1 8.2  NEUTROABS 9.7*  --  11.7*  --  7.0  HGB 8.5* 7.4* 7.3* 6.9* 8.6*  HCT 26.3* 24.1* 22.8* 22.5* 27.5*  MCV 96.3 99.2 98.7 100.4* 97.2  PLT 198 174 161 151 169   Cardiac Enzymes: Recent Labs  Lab 10/06/18 2142 10/07/18 0319 10/07/18 0909  TROPONINI 0.03* <0.03 0.03*   BNP: Invalid input(s): POCBNP CBG: No results for input(s): GLUCAP in the last 168 hours. D-Dimer No results for input(s): DDIMER in the last 72 hours. Hgb A1c No results for input(s): HGBA1C in the last 72 hours. Lipid Profile No results for input(s): CHOL, HDL, LDLCALC, TRIG, CHOLHDL, LDLDIRECT in the last 72 hours. Thyroid function studies No results for input(s): TSH, T4TOTAL, T3FREE, THYROIDAB in the last 72 hours.  Invalid input(s): FREET3 Anemia work up No results for input(s): VITAMINB12, FOLATE, FERRITIN, TIBC, IRON, RETICCTPCT in the last 72 hours. Urinalysis    Component Value Date/Time   COLORURINE STRAW (A) 10/06/2018 1820   APPEARANCEUR CLEAR 10/06/2018 1820   LABSPEC 1.011 10/06/2018 1820   LABSPEC 1.020 09/17/2013 1036   PHURINE 5.0 10/06/2018 1820   GLUCOSEU NEGATIVE 10/06/2018 1820   HGBUR MODERATE (A) 10/06/2018 1820   BILIRUBINUR NEGATIVE 10/06/2018 1820   KETONESUR NEGATIVE 10/06/2018 1820   PROTEINUR 100 (A) 10/06/2018 1820   UROBILINOGEN 0.2 09/17/2013 1036   NITRITE NEGATIVE 10/06/2018 1820   LEUKOCYTESUR NEGATIVE 10/06/2018 1820   Sepsis Labs Invalid input(s): PROCALCITONIN,  WBC,  LACTICIDVEN Microbiology Recent Results (from the past 240 hour(s))  Culture, blood (Routine x 2)     Status: None (Preliminary result)   Collection Time: 10/06/18  4:25 PM  Result Value Ref Range Status   Specimen Description BLOOD RIGHT ANTECUBITAL  Final   Special Requests    Final    BOTTLES DRAWN AEROBIC AND ANAEROBIC Blood Culture adequate volume Performed at [facility], [place] [address], [place], [place] [address]    Culture NO GROWTH 3 DAYS  Final   Report Status PENDING  Incomplete  Culture, blood (Routine x 2)     Status: None (Preliminary result)   Collection Time: 10/06/18  4:27 PM  Result Value Ref Range Status   Specimen Description BLOOD RIGHT FOREARM  Final   Special Requests   Final    BOTTLES DRAWN AEROBIC AND ANAEROBIC Blood Culture adequate volume Performed at [place] [address] [place], [place] [address]    Culture NO GROWTH 3 DAYS  Final   Report Status PENDING  Incomplete  Urine culture     Status: None   Collection Time: 10/06/18  6:20 PM  Result Value Ref Range Status   Specimen Description   Final    URINE, CLEAN CATCH Performed at [facility], [place] [address] [place], [place] [address]    Special Requests   Final    NONE Performed at [facility], [place] [address] [place], [place] [address]    Culture   Final    NO GROWTH Performed at [facility], [place] [address] [place], [place] [address]    Report Status 10/08/2018 FINAL  Final  MRSA PCR Screening     Status: None   Collection Time: 10/07/18  9:17 AM  Result Value Ref Range Status   MRSA by PCR  NEGATIVE NEGATIVE Final    Comment:        The GeneXpert MRSA Assay (FDA approved for NASAL specimens only), is one component of a comprehensive MRSA colonization surveillance program. It is not intended to diagnose MRSA infection nor to guide or monitor treatment for MRSA infections. Performed at [facility], [place] [address] [place], [place] [address]      Time coordinating discharge:  34 minutes  SIGNED:   Georgette Shell, MD  [facility] 10/10/2018, 8:17 AM Pager   If 7PM-7AM, please contact night-coverage [email or web address] Password  [email or web address]

## 2018-10-11 ENCOUNTER — Encounter (HOSPITAL_COMMUNITY): Payer: Self-pay | Admitting: Cardiovascular Disease

## 2018-10-11 LAB — CULTURE, BLOOD (ROUTINE X 2)
CULTURE: NO GROWTH
Culture: NO GROWTH
Special Requests: ADEQUATE
Special Requests: ADEQUATE

## 2018-10-12 ENCOUNTER — Ambulatory Visit
Admission: RE | Admit: 2018-10-12 | Discharge: 2018-10-12 | Disposition: A | Discharge status: Home / Self Care | Payer: Medicare HMO | Source: Ambulatory Visit | Attending: Radiation Oncology | Admitting: Radiation Oncology

## 2018-10-12 ENCOUNTER — Encounter: Payer: Self-pay | Admitting: Radiation Oncology

## 2018-10-12 ENCOUNTER — Other Ambulatory Visit: Payer: Self-pay

## 2018-10-12 VITALS — BP 146/66 | HR 74 | Temp 98.1°F | Resp 20 | Ht 70.0 in | Wt 153.0 lb

## 2018-10-12 DIAGNOSIS — Z510 Encounter for antineoplastic radiation therapy: Secondary | ICD-10-CM | POA: Diagnosis present

## 2018-10-12 DIAGNOSIS — C9002 Multiple myeloma in relapse: Secondary | ICD-10-CM

## 2018-10-12 DIAGNOSIS — M898X8 Other specified disorders of bone, other site: Secondary | ICD-10-CM | POA: Diagnosis not present

## 2018-10-12 DIAGNOSIS — Z923 Personal history of irradiation: Secondary | ICD-10-CM | POA: Diagnosis not present

## 2018-10-12 DIAGNOSIS — Z79899 Other long term (current) drug therapy: Secondary | ICD-10-CM | POA: Insufficient documentation

## 2018-10-12 NOTE — Progress Notes (Signed)
Histology and Location of Primary Cancer: Principle Diagnosis:  Recurrent IgG lambda myeloma - progressive   Location(s) of Symptomatic Metastases: Per CT Hip W Contrast 08/21/18:  IMPRESSION: Lytic lesions in the right femoral neck and right ilium consistent with multiple myeloma. No pathologic fractures. However, there is a small focal area of cortical disruption of the posterosuperior aspect of the right femoral neck at the site of the lesion, increasing risk for pathologic fracture.  Past/Anticipated chemotherapy by medical oncology, if any: Per Dr. 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 Pt reports this pain as brief, rated 5/10.     Ambulatory status? Walker? [facility]?: [facility] Pt ambulates with walker in home.  SAFETY ISSUES: Prior radiation? Radiation treatment dates:   06/11/16-06/20/16   Site/dose:  Right pelvis/ 28 Gy in 8 fx Radiation treatment dates:   February 14 through October 20, 2015  Site/dose:   Lower thoracic spine, upper lumbar spine, 30 gray in 15 fractions Radiation treatment dates:   November 6 through July 06, 2013  Site/dose:   Upper lumbar spine, 35 gray in 14 fractions    Pacemaker/ICD? No  Possible current pregnancy? N/A  Is the patient on methotrexate? No  Current Complaints / other details:  Pt presents today for reconsult with  Dr. KiSondra Comeor Radiation Oncology. Pt is accompanied by grandson, Christian Evertwho is pt's caregiver.   BP (!) 146/66 (BP Location: Right Arm, Patient Position: Sitting)   Pulse 74   Temp 98.1 F (36.7 C) (Oral)   Resp 20   Ht 5' 10"  (1.778 m)   Wt 153 lb (69.4 kg)   SpO2 100%   BMI 21.95 kg/m   Wt Readings from Last 3 Encounters:  10/12/18 153 lb (69.4 kg)  10/06/18 143 lb (64.9 kg)  09/01/18 150 lb (68 kg)   JiLoma SousaRN BSN

## 2018-10-12 NOTE — Progress Notes (Signed)
Radiation Oncology         [phone] ________________________________  Name: Christian Burns MRN: [id number]  Date: 10/12/2018  DOB: [birth year]  Reevaluation Visit Note  CC: Leanna Battles, MD  [name], MD    ICD-10-CM   1. Multiple myeloma in relapse [facility]) C90.02     Diagnosis:   Multiple Myeloma in relapse    Narrative:  The patient returns today for reevaluation.  he is doing well overall. He is accompanied by his grandson today. He is here at the request of Dr. Marin Olp for his progressive IgG lambda myeloma, now s/p surgery for  an impending fracture of the right femoral neck.   He has been on treatment for a long history of IgG lambda myeloma. He recently has been on Kyprolis/Cytoxan. He has done well with this treatment until recently, he began to have worsening pain in the right hip, and it became more difficult for him to walk.  He was recently admitted to the hospital from the ED (February 25-29) for  lower lobe pneumonia accompanied by high fever and sepsis.  Since he was last seen in the office, on August 13, 2018 he had an X-ray of his hip after presenting with worsening pain. This scan showed a large right femoral neck lesion which was more evident and presumably progressed since 2017. No evidence of superimposed fracture. Stable large right superior acetabular lesion was also noted. Accordingly, he proceeded with a CT on January 10 which showed lytic lesions in the right femoral neck and right ilium, measuring 3.5 x 2.7 x 2.2 cm and 6.4 x 6.7 x 2.9 cm respectively, consistent with multiple myeloma. No pathologic fractures. However, there was a small focal area of cortical disruption of the posterosuperior aspect of the right femoral neck at the site of the lesion, increasing the risk for pathologic fracture.  He underwent a chest X-ray on January 21 that showed a destructive lesion of the left ninth rib associated with soft tissue mass. Findings were worrisome for  malignancy.      Dr. Rhona Raider took him to surgery on 09/01/2018. A nail was put into the femoral neck to stabilize it. The surgery went well. There was no obvious abnormality that was not expected.  Most recently, he received an X-ray on February 25 after presenting with fever and chills which showed reticular opacity at the left lung base, concerning for pneumonia given the history of fever. Redemonstration of destructive left ninth rib lesion. Correlation with contrast-enhanced CT may be useful, as the findings are compatible with malignancy.            On review of systems, he reports pain in his right femur and inguinal area. he denies right leg swelling and any other symptoms. Pertinent positives are listed and detailed within the above HPI.                PREVIOUS RADIATION THERAPY: Yes - Radiation treatment dates:06/11/16-06/20/16 Site/dose:Right pelvis / 28 Gy in 8 fractions  Radiation treatment dates:February 14 through October 20, 2015 Site/dose:Lower thoracic spine, upper lumbar spine / 30 gray in 15 fractions  Radiation treatment dates:November 6 through July 06, 2013 Site/dose:Upper lumbar spine / 35 gray in 14 fractions  ALLERGIES:  is allergic to sulfa antibiotics and sulfasalazine.  Meds: Current Outpatient Medications  Medication Sig Dispense Refill  . amLODipine-benazepril (LOTREL) 5-10 MG capsule TAKE 1 CAPSULE BY MOUTH DAILY 30 capsule 0  . amoxicillin-clavulanate (AUGMENTIN) 875-125 MG tablet Take 1  tablet by mouth 2 (two) times daily for 2 days. 4 tablet 0  . finasteride (PROSCAR) 5 MG tablet Take 5 mg by mouth daily.    . Multiple Vitamin (MULTIVITAMIN WITH MINERALS) TABS tablet Take 1 tablet by mouth daily.    . penicillin v potassium (VEETID) 500 MG tablet Take 1 tablet (500 mg total) by mouth QID. 120 tablet 5  . Probiotic Product (PROBIOTIC PO) Take 1 capsule by mouth daily.    Marland Kitchen pyridOXINE (VITAMIN B-6) 100 MG tablet Take 100 mg by mouth  daily.     . traMADol (ULTRAM) 50 MG tablet Take 50 mg by mouth every 8 (eight) hours as needed for pain.    . vitamin B-12 (CYANOCOBALAMIN) 500 MCG tablet Take 500 mcg by mouth daily.    . Vitamin D, Ergocalciferol, (DRISDOL) 50000 UNITS CAPS capsule Take 50,000 Units by mouth every Sunday.     . montelukast (SINGULAIR) 10 MG tablet Take 1 tablet (10 mg total) by mouth at bedtime. (Patient not taking: Reported on 10/12/2018) 30 tablet 2  . pomalidomide (POMALYST) 2 MG capsule Take 1 capsule (2 mg total) by mouth daily. Take with water on days 1-21. Repeat every 28 days. [id number] (Patient not taking: Reported on 10/12/2018) 21 capsule 0   No current facility-administered medications for this encounter.     Physical Findings: The patient is in no acute distress. Patient is alert and oriented.  height is _0  (1.778 m) and weight is 153 lb (69.4 kg). His oral temperature is 98.1 F (36.7 C). His blood pressure is 146/66 (abnormal) and his pulse is 74. His respiration is 20 and oxygen saturation is 100%. .  No significant changes. Lungs are clear to auscultation bilaterally. Heart has regular rate and rhythm. No palpable cervical, supraclavicular, or axillary adenopathy. Abdomen soft, non-tender, normal bowel sounds. No significant edema noted in either lower extremity. Patient remained in a wheelchair for the evaluation   Lab Findings: Lab Results  Component Value Date   WBC 8.2 10/09/2018   HGB 8.6 (L) 10/09/2018   HCT 27.5 (L) 10/09/2018   MCV 97.2 10/09/2018   PLT 169 10/09/2018    Radiographic Findings: Dg Chest 2 View  Result Date: 10/06/2018 CLINICAL DATA:  85 year old male with a history of fever and chills EXAM: CHEST - 2 VIEW COMPARISON:  09/01/2018 FINDINGS: Cardiomediastinal silhouette unchanged in size and contour. No pneumothorax. No pleural effusion. Reticular opacities at the left lung base, new from the prior with partial obscuration left hemidiaphragm. No pleural  effusion. Destructive soft tissue lesion of the left chest wall involving ninth rib, similar to the prior. IMPRESSION: Reticular opacity at the left lung base, concerning for pneumonia given the history of fever. Redemonstration of destructive left ninth rib lesion. Correlation with contrast-enhanced CT may be useful, as the findings are compatible with malignancy. Electronically Signed   By: Corrie Mckusick D.O.   On: 10/06/2018 17:20    Impression:   Multiple myeloma in relapse. Patient has undergone recent stabilization of his right femur. He would be a good candidate for post-operative treatments to this area. I anticipate 10 treatments to the right femur. Discussed treatment course with the patient and his grandson and they agree with treatment.  Plan: He is scheduled to return for simulation Wednesday, March 4 at [facility].  ____________________________________   Blair Promise, PhD, MD    This document serves as a record of services personally performed by Gery Pray, MD. It was created on  his behalf by Wyn Quaker, a trained medical scribe. The creation of this record is based on the scribe's personal observations and the provider's statements to them. This document has been checked and approved by the attending provider.

## 2018-10-13 ENCOUNTER — Other Ambulatory Visit: Payer: Self-pay | Admitting: Hematology & Oncology

## 2018-10-13 DIAGNOSIS — M25551 Pain in right hip: Secondary | ICD-10-CM | POA: Diagnosis not present

## 2018-10-13 DIAGNOSIS — R262 Difficulty in walking, not elsewhere classified: Secondary | ICD-10-CM | POA: Diagnosis not present

## 2018-10-13 DIAGNOSIS — M6281 Muscle weakness (generalized): Secondary | ICD-10-CM | POA: Diagnosis not present

## 2018-10-13 DIAGNOSIS — R2681 Unsteadiness on feet: Secondary | ICD-10-CM | POA: Diagnosis not present

## 2018-10-13 DIAGNOSIS — Z4789 Encounter for other orthopedic aftercare: Secondary | ICD-10-CM | POA: Diagnosis not present

## 2018-10-14 ENCOUNTER — Ambulatory Visit
Admission: RE | Admit: 2018-10-14 | Discharge: 2018-10-14 | Disposition: A | Discharge status: Home / Self Care | Payer: Medicare HMO | Source: Ambulatory Visit | Attending: Radiation Oncology | Admitting: Radiation Oncology

## 2018-10-14 DIAGNOSIS — C9002 Multiple myeloma in relapse: Secondary | ICD-10-CM | POA: Diagnosis not present

## 2018-10-14 DIAGNOSIS — C7951 Secondary malignant neoplasm of bone: Secondary | ICD-10-CM | POA: Diagnosis not present

## 2018-10-14 DIAGNOSIS — Z510 Encounter for antineoplastic radiation therapy: Secondary | ICD-10-CM | POA: Diagnosis not present

## 2018-10-14 NOTE — Progress Notes (Signed)
  Radiation Oncology         [phone] ________________________________  Name: GEORGIOS KINA MRN: [id number]  Date: 10/14/2018  DOB: 08-08-1930  SIMULATION AND TREATMENT PLANNING NOTE    ICD-10-CM   1. Multiple myeloma in relapse ([place]) C90.02     DIAGNOSIS:  Multiple myeloma in relapse  NARRATIVE:  The patient was brought to the [facility].  Identity was confirmed.  All relevant records and images related to the planned course of therapy were reviewed.  The patient freely provided informed written consent to proceed with treatment after reviewing the details related to the planned course of therapy. The consent form was witnessed and verified by the simulation staff.  Then, the patient was set-up in a stable reproducible  supine position for radiation therapy.  CT images were obtained.  Surface markings were placed.  The CT images were loaded into the planning software.  Then the target and avoidance structures were contoured.  Treatment planning then occurred.  The radiation prescription was entered and confirmed.  Then, I designed and supervised the construction of a total of 4 medically necessary complex treatment devices.  I have requested : Isodose Plan.  I have ordered:dose calc.  PLAN:  The patient will receive 30 Gy in 10 fractions directed at the right femur.  -----------------------------------  Blair Promise, PhD, MD  This document serves as a record of services personally performed by Gery Pray, MD. It was created on his behalf by Wilburn Mylar, a trained medical scribe. The creation of this record is based on the scribe's personal observations and the provider's statements to them. This document has been checked and approved by the attending provider.

## 2018-10-16 DIAGNOSIS — C9002 Multiple myeloma in relapse: Secondary | ICD-10-CM | POA: Diagnosis not present

## 2018-10-16 DIAGNOSIS — Z510 Encounter for antineoplastic radiation therapy: Secondary | ICD-10-CM | POA: Diagnosis not present

## 2018-10-16 DIAGNOSIS — M25551 Pain in right hip: Secondary | ICD-10-CM | POA: Diagnosis not present

## 2018-10-16 DIAGNOSIS — M6281 Muscle weakness (generalized): Secondary | ICD-10-CM | POA: Diagnosis not present

## 2018-10-16 DIAGNOSIS — R262 Difficulty in walking, not elsewhere classified: Secondary | ICD-10-CM | POA: Diagnosis not present

## 2018-10-16 DIAGNOSIS — R2681 Unsteadiness on feet: Secondary | ICD-10-CM | POA: Diagnosis not present

## 2018-10-16 DIAGNOSIS — Z4789 Encounter for other orthopedic aftercare: Secondary | ICD-10-CM | POA: Diagnosis not present

## 2018-10-16 DIAGNOSIS — C7951 Secondary malignant neoplasm of bone: Secondary | ICD-10-CM | POA: Diagnosis not present

## 2018-10-18 ENCOUNTER — Other Ambulatory Visit: Payer: Self-pay | Admitting: Hematology & Oncology

## 2018-10-18 DIAGNOSIS — I10 Essential (primary) hypertension: Secondary | ICD-10-CM

## 2018-10-21 ENCOUNTER — Ambulatory Visit: Payer: Medicare HMO | Admitting: Radiation Oncology

## 2018-10-22 ENCOUNTER — Ambulatory Visit: Payer: Medicare HMO

## 2018-10-22 ENCOUNTER — Other Ambulatory Visit: Payer: Medicare HMO

## 2018-10-22 ENCOUNTER — Ambulatory Visit: Payer: Medicare HMO | Admitting: Hematology & Oncology

## 2018-10-23 ENCOUNTER — Ambulatory Visit: Payer: Medicare HMO

## 2018-10-26 ENCOUNTER — Other Ambulatory Visit: Payer: Self-pay

## 2018-10-26 ENCOUNTER — Ambulatory Visit
Admission: RE | Admit: 2018-10-26 | Discharge: 2018-10-26 | Disposition: A | Discharge status: Home / Self Care | Payer: Medicare HMO | Source: Ambulatory Visit | Attending: Radiation Oncology | Admitting: Radiation Oncology

## 2018-10-26 DIAGNOSIS — C9002 Multiple myeloma in relapse: Secondary | ICD-10-CM

## 2018-10-26 DIAGNOSIS — C7951 Secondary malignant neoplasm of bone: Secondary | ICD-10-CM | POA: Diagnosis not present

## 2018-10-26 DIAGNOSIS — Z510 Encounter for antineoplastic radiation therapy: Secondary | ICD-10-CM | POA: Diagnosis not present

## 2018-10-26 NOTE — Progress Notes (Signed)
  Radiation Oncology         [phone] ________________________________  Name: Christian Burns MRN: [id number]  Date: 10/26/2018  DOB: 23-Oct-1929  Simulation Verification Note    ICD-10-CM   1. Multiple myeloma in relapse ([place]) C90.02     Status: outpatient  NARRATIVE: The patient was brought to the treatment unit and placed in the planned treatment position. The clinical setup was verified. Then port films were obtained and uploaded to the radiation oncology medical record software.  The treatment beams were carefully compared against the planned radiation fields. The position location and shape of the radiation fields was reviewed. They targeted volume of tissue appears to be appropriately covered by the radiation beams. Organs at risk appear to be excluded as planned.  Based on my personal review, I approved the simulation verification. The patient's treatment will proceed as planned.  -----------------------------------  Blair Promise, PhD, MD  This document serves as a record of services personally performed by Gery Pray, MD. It was created on his behalf by Mary-Margaret Loma Messing, a trained medical scribe. The creation of this record is based on the scribe's personal observations and the provider's statements to them. This document has been checked and approved by the attending provider.

## 2018-10-27 ENCOUNTER — Other Ambulatory Visit: Payer: Self-pay

## 2018-10-27 ENCOUNTER — Ambulatory Visit
Admission: RE | Admit: 2018-10-27 | Discharge: 2018-10-27 | Disposition: A | Discharge status: Home / Self Care | Payer: Medicare HMO | Source: Ambulatory Visit | Attending: Radiation Oncology | Admitting: Radiation Oncology

## 2018-10-27 DIAGNOSIS — R262 Difficulty in walking, not elsewhere classified: Secondary | ICD-10-CM | POA: Diagnosis not present

## 2018-10-27 DIAGNOSIS — M6281 Muscle weakness (generalized): Secondary | ICD-10-CM | POA: Diagnosis not present

## 2018-10-27 DIAGNOSIS — M25551 Pain in right hip: Secondary | ICD-10-CM | POA: Diagnosis not present

## 2018-10-27 DIAGNOSIS — R2681 Unsteadiness on feet: Secondary | ICD-10-CM | POA: Diagnosis not present

## 2018-10-27 DIAGNOSIS — Z510 Encounter for antineoplastic radiation therapy: Secondary | ICD-10-CM | POA: Diagnosis not present

## 2018-10-27 DIAGNOSIS — Z4789 Encounter for other orthopedic aftercare: Secondary | ICD-10-CM | POA: Diagnosis not present

## 2018-10-28 ENCOUNTER — Other Ambulatory Visit: Payer: Self-pay

## 2018-10-28 ENCOUNTER — Ambulatory Visit
Admission: RE | Admit: 2018-10-28 | Discharge: 2018-10-28 | Disposition: A | Discharge status: Home / Self Care | Payer: Medicare HMO | Source: Ambulatory Visit | Attending: Radiation Oncology | Admitting: Radiation Oncology

## 2018-10-28 ENCOUNTER — Telehealth: Payer: Self-pay | Admitting: Family

## 2018-10-28 DIAGNOSIS — Z510 Encounter for antineoplastic radiation therapy: Secondary | ICD-10-CM | POA: Diagnosis not present

## 2018-10-28 NOTE — Telephone Encounter (Signed)
Spoke with patient and screened/ No symptoms/ I called RAD ONC / and they suggested that patient come in around 9:30-10:00 in order for him to make his appt to [facility]/  I spoke with [name] regarding Rad Onc Appt time change also

## 2018-10-29 ENCOUNTER — Encounter: Payer: Self-pay | Admitting: Family

## 2018-10-29 ENCOUNTER — Inpatient Hospital Stay: Payer: Medicare HMO | Attending: Hematology & Oncology

## 2018-10-29 ENCOUNTER — Inpatient Hospital Stay (HOSPITAL_BASED_OUTPATIENT_CLINIC_OR_DEPARTMENT_OTHER): Payer: Medicare HMO | Admitting: Family

## 2018-10-29 ENCOUNTER — Other Ambulatory Visit: Payer: Self-pay

## 2018-10-29 ENCOUNTER — Ambulatory Visit
Admission: RE | Admit: 2018-10-29 | Discharge: 2018-10-29 | Disposition: A | Discharge status: Home / Self Care | Payer: Medicare HMO | Source: Ambulatory Visit | Attending: Radiation Oncology | Admitting: Radiation Oncology

## 2018-10-29 ENCOUNTER — Inpatient Hospital Stay: Payer: Medicare HMO

## 2018-10-29 ENCOUNTER — Other Ambulatory Visit: Payer: Self-pay | Admitting: *Deleted

## 2018-10-29 ENCOUNTER — Other Ambulatory Visit: Payer: Self-pay | Admitting: Hematology & Oncology

## 2018-10-29 VITALS — BP 133/65 | HR 71 | Temp 98.3°F | Resp 18 | Wt 141.0 lb

## 2018-10-29 DIAGNOSIS — C9002 Multiple myeloma in relapse: Secondary | ICD-10-CM

## 2018-10-29 DIAGNOSIS — R5383 Other fatigue: Secondary | ICD-10-CM | POA: Diagnosis not present

## 2018-10-29 DIAGNOSIS — G629 Polyneuropathy, unspecified: Secondary | ICD-10-CM

## 2018-10-29 DIAGNOSIS — Z5112 Encounter for antineoplastic immunotherapy: Secondary | ICD-10-CM | POA: Insufficient documentation

## 2018-10-29 DIAGNOSIS — D508 Other iron deficiency anemias: Secondary | ICD-10-CM

## 2018-10-29 DIAGNOSIS — Z510 Encounter for antineoplastic radiation therapy: Secondary | ICD-10-CM | POA: Diagnosis not present

## 2018-10-29 DIAGNOSIS — Z419 Encounter for procedure for purposes other than remedying health state, unspecified: Secondary | ICD-10-CM

## 2018-10-29 DIAGNOSIS — C9000 Multiple myeloma not having achieved remission: Secondary | ICD-10-CM

## 2018-10-29 LAB — CBC WITH DIFFERENTIAL (CANCER CENTER ONLY)
Abs Immature Granulocytes: 0.01 10*3/uL (ref 0.00–0.07)
BASOS ABS: 0 10*3/uL (ref 0.0–0.1)
Basophils Relative: 1 %
Eosinophils Absolute: 0.2 10*3/uL (ref 0.0–0.5)
Eosinophils Relative: 4 %
HCT: 33.3 % — ABNORMAL LOW (ref 39.0–52.0)
HEMOGLOBIN: 10.8 g/dL — AB (ref 13.0–17.0)
Immature Granulocytes: 0 %
Lymphocytes Relative: 8 %
Lymphs Abs: 0.3 10*3/uL — ABNORMAL LOW (ref 0.7–4.0)
MCH: 30.2 pg (ref 26.0–34.0)
MCHC: 32.4 g/dL (ref 30.0–36.0)
MCV: 93 fL (ref 80.0–100.0)
Monocytes Absolute: 0.6 10*3/uL (ref 0.1–1.0)
Monocytes Relative: 14 %
Neutro Abs: 3 10*3/uL (ref 1.7–7.7)
Neutrophils Relative %: 73 %
Platelet Count: 231 10*3/uL (ref 150–400)
RBC: 3.58 MIL/uL — ABNORMAL LOW (ref 4.22–5.81)
RDW: 14 % (ref 11.5–15.5)
WBC Count: 4.2 10*3/uL (ref 4.0–10.5)
nRBC: 0 % (ref 0.0–0.2)

## 2018-10-29 LAB — CMP (CANCER CENTER ONLY)
ALT: 16 U/L (ref 0–44)
AST: 21 U/L (ref 15–41)
Albumin: 3.5 g/dL (ref 3.5–5.0)
Alkaline Phosphatase: 92 U/L (ref 38–126)
Anion gap: 5 (ref 5–15)
BUN: 25 mg/dL — ABNORMAL HIGH (ref 8–23)
CHLORIDE: 108 mmol/L (ref 98–111)
CO2: 26 mmol/L (ref 22–32)
Calcium: 9.2 mg/dL (ref 8.9–10.3)
Creatinine: 1.41 mg/dL — ABNORMAL HIGH (ref 0.61–1.24)
GFR, Est AFR Am: 52 mL/min — ABNORMAL LOW (ref >60)
GFR, Estimated: 45 mL/min — ABNORMAL LOW (ref >60)
Glucose, Bld: 96 mg/dL (ref 70–99)
Potassium: 4.5 mmol/L (ref 3.5–5.1)
Sodium: 139 mmol/L (ref 135–145)
Total Bilirubin: 0.6 mg/dL (ref 0.3–1.2)
Total Protein: 7.3 g/dL (ref 6.5–8.1)

## 2018-10-29 MED ORDER — PROCHLORPERAZINE MALEATE 10 MG PO TABS
10.0000 mg | ORAL_TABLET | Freq: Once | ORAL | Status: AC
  Administered 2018-10-29: 10 mg via ORAL

## 2018-10-29 MED ORDER — DIPHENHYDRAMINE HCL 25 MG PO CAPS
ORAL_CAPSULE | ORAL | Status: AC
  Filled 2018-10-29: qty 2

## 2018-10-29 MED ORDER — PROCHLORPERAZINE MALEATE 10 MG PO TABS
ORAL_TABLET | ORAL | Status: AC
  Filled 2018-10-29: qty 1

## 2018-10-29 MED ORDER — DEXAMETHASONE SODIUM PHOSPHATE 10 MG/ML IJ SOLN
INTRAMUSCULAR | Status: AC
  Filled 2018-10-29: qty 1

## 2018-10-29 MED ORDER — MONTELUKAST SODIUM 10 MG PO TABS: 10.0000 mg | ORAL_TABLET | Freq: Every day | ORAL | 2 refills | Status: DC

## 2018-10-29 MED ORDER — FAMOTIDINE IN NACL 20-0.9 MG/50ML-% IV SOLN
INTRAVENOUS | Status: AC
  Filled 2018-10-29: qty 50

## 2018-10-29 MED ORDER — SODIUM CHLORIDE 0.9 % IV SOLN
10.0000 mg/kg | Freq: Once | INTRAVENOUS | Status: AC
  Administered 2018-10-29: 700 mg via INTRAVENOUS
  Filled 2018-10-29: qty 16

## 2018-10-29 MED ORDER — SODIUM CHLORIDE 0.9 % IV SOLN
Freq: Once | INTRAVENOUS | Status: AC
  Administered 2018-10-29: 13:00:00 via INTRAVENOUS
  Filled 2018-10-29: qty 250

## 2018-10-29 MED ORDER — ACETAMINOPHEN 325 MG PO TABS
ORAL_TABLET | ORAL | Status: AC
  Filled 2018-10-29: qty 2

## 2018-10-29 MED ORDER — POMALIDOMIDE 2 MG PO CAPS: 2.0000 mg | ORAL_CAPSULE | Freq: Every day | ORAL | 0 refills | Status: DC

## 2018-10-29 MED ORDER — ACETAMINOPHEN 325 MG PO TABS
650.0000 mg | ORAL_TABLET | Freq: Once | ORAL | Status: AC
  Administered 2018-10-29: 650 mg via ORAL

## 2018-10-29 MED ORDER — DEXAMETHASONE SODIUM PHOSPHATE 10 MG/ML IJ SOLN
8.0000 mg | Freq: Once | INTRAMUSCULAR | Status: AC
  Administered 2018-10-29: 8 mg via INTRAVENOUS

## 2018-10-29 MED ORDER — FAMOTIDINE IN NACL 20-0.9 MG/50ML-% IV SOLN
20.0000 mg | Freq: Once | INTRAVENOUS | Status: AC
  Administered 2018-10-29: 20 mg via INTRAVENOUS

## 2018-10-29 MED ORDER — DIPHENHYDRAMINE HCL 25 MG PO CAPS
50.0000 mg | ORAL_CAPSULE | Freq: Once | ORAL | Status: AC
  Administered 2018-10-29: 50 mg via ORAL

## 2018-10-29 MED FILL — MONTELUKAST SOD 10 MG TAB: 10 | 30 days supply | Qty: 30 | Fill #0

## 2018-10-29 NOTE — Progress Notes (Signed)
Hematology and Oncology Follow Up Visit  Christian Burns [id number] Aug 28, 1929 85 y.o. 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 is here today with his grandson for follow-up and to start cycle 1 of Elotuzomab.  He continues to recuperate from his left hip surgery and has 1 week left of radiation to the right femur with Dr. Sondra Come. He is also doing 4 days a week of PT/OT. He is ambulating at home with a walker for support. He is in a wheel chair today.  He has had some mild fatigue.  He did developed sepsis due to pneumonia in February and states that he has completed his antibiotic therapy. He also had a stomach virus last week with diarrhea. This has since resolved and he has had no other symptoms.  He has had no fever, chills, n/v, cough, rash, dizziness, SOB, chest pain, palpitations, abdominal pain or changes in bowel or bladder habit.  No swelling in his extremities at this time.  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  No lymphadenopathy noted on exam.  He states that his appetite is improving and he is eating ice cream at bedtime for extra calories.   ECOG Performance Status: 2 - Symptomatic, <50% confined to bed  Medications:  Allergies as of  10/29/2018      Reactions   Sulfa Antibiotics Itching   Sulfasalazine Itching      Medication List       Accurate as of October 29, 2018 11:52 AM. Always use your most recent med list.        amLODipine-benazepril 5-10 MG capsule Commonly known as:  LOTREL TAKE 1 CAPSULE BY MOUTH DAILY   finasteride 5 MG tablet Commonly known as:  PROSCAR Take 5 mg by mouth daily.   montelukast 10 MG tablet Commonly known as:  Singulair Take 1 tablet (10 mg total) by mouth at bedtime.   multivitamin with minerals Tabs tablet Take 1 tablet by mouth daily.   penicillin v potassium 500 MG tablet Commonly known as:  VEETID Take 1 tablet (500 mg total) by mouth QID.   pomalidomide 2 MG capsule Commonly known as:  POMALYST Take 1 capsule (2 mg total) by mouth daily. Take with water on days 1-21. Repeat every 28 days. [id number]   PROBIOTIC PO Take 1 capsule by mouth daily.   pyridOXINE 100 MG tablet Commonly known as:  VITAMIN B-6 Take 100 mg by mouth daily.   traMADol 50 MG tablet Commonly known as:  ULTRAM Take 50 mg by mouth every 8 (eight) hours as needed for pain.   vitamin B-12 500 MCG tablet Commonly known as:  CYANOCOBALAMIN Take 500 mcg by mouth daily.   Vitamin D (Ergocalciferol) 1.25 MG (50000 UT) Caps capsule Commonly known as:  DRISDOL Take 50,000 Units  by mouth every Sunday.       Allergies:  Allergies  Allergen Reactions  . Sulfa Antibiotics Itching  . Sulfasalazine Itching    Past Medical History, Surgical history, Social history, and Family History were reviewed and updated.  Review of Systems: All other 10 point review of systems is negative.   Physical Exam:  weight is 141 lb (64 kg). His oral temperature is 98.3 F (36.8 C). His blood pressure is 133/65 and his pulse is 71. His respiration is 18 and oxygen saturation is 100%.   Wt Readings from Last 3 Encounters:  10/29/18 141 lb (64 kg)  10/12/18 153 lb (69.4 kg)  10/06/18 143 lb (64.9 kg)     Ocular: Sclerae unicteric, pupils equal, round and reactive to light Ear-nose-throat: Oropharynx clear, dentition fair Lymphatic: No cervical, supraclavicular or axillary adenopathy Lungs no rales or rhonchi, good excursion bilaterally Heart regular rate and rhythm, no murmur appreciated Abd soft, nontender, positive bowel sounds, no liver or spleen tip palpated on exam, no fluid wave  MSK no focal spinal tenderness, no joint edema Neuro: non-focal, well-oriented, appropriate affect Breasts: Deferred   Lab Results  Component Value Date   WBC 4.2 10/29/2018   HGB 10.8 (L) 10/29/2018   HCT 33.3 (L) 10/29/2018   MCV 93.0 10/29/2018   PLT 231 10/29/2018   Lab Results  Component Value Date   FERRITIN 39 08/13/2018   IRON 104 08/13/2018   TIBC 274 08/13/2018   UIBC 170 08/13/2018   IRONPCTSAT 38 08/13/2018   Lab Results  Component Value Date   RBC 3.58 (L) 10/29/2018   Lab Results  Component Value Date   KPAFRELGTCHN 17.6 08/13/2018   LAMBDASER 246.9 (H) 08/13/2018   KAPLAMBRATIO 0.07 (L) 08/13/2018   Lab Results  Component Value Date   IGGSERUM 1,419 08/13/2018   IGA 64 08/13/2018   IGMSERUM 31 08/13/2018   Lab Results  Component Value Date   TOTALPROTELP 6.7 08/13/2018   ALBUMINELP 3.9 08/13/2018   A1GS 0.2 08/13/2018   A2GS 0.7 08/13/2018   BETS 0.8 08/13/2018   BETA2SER 0.3 07/28/2015   GAMS 1.1 08/13/2018   MSPIKE 0.9 (H) 08/13/2018   SPEI Comment 08/13/2018     Chemistry      Component Value Date/Time   NA 139 10/29/2018 1031   NA 146 (H) 08/07/2017 1153   NA 141 01/09/2017 1004   K 4.5 10/29/2018 1031   K 4.6 08/07/2017 1153   K 4.4 01/09/2017 1004   CL 108 10/29/2018 1031   CL 108 08/07/2017 1153   CO2 26 10/29/2018 1031   CO2 26 08/07/2017 1153   CO2 22 01/09/2017 1004   BUN 25 (H) 10/29/2018 1031   BUN 24 (H) 08/07/2017 1153   BUN 23.9 01/09/2017 1004   CREATININE 1.41 (H) 10/29/2018 1031   CREATININE 1.56 (H) 05/26/2018 1508    CREATININE 1.5 (H) 01/09/2017 1004      Component Value Date/Time   CALCIUM 9.2 10/29/2018 1031   CALCIUM 8.8 08/07/2017 1153   CALCIUM 8.8 01/09/2017 1004   ALKPHOS 92 10/29/2018 1031   ALKPHOS 97 (H) 08/07/2017 1153   ALKPHOS 80 01/09/2017 1004   AST 21 10/29/2018 1031   AST 18 01/09/2017 1004   ALT 16 10/29/2018 1031   ALT 20 08/07/2017 1153   ALT 12 01/09/2017 1004   BILITOT 0.6 10/29/2018 1031   BILITOT 0.92 01/09/2017 1004       Impression and Plan: Mr. Crossland is a  very pleasant 85 yo caucasian gentleman with relapsed IgG lambda myeloma.  We will proceed with cycle 1 of Elotuzomab today as planned per Dr. Marin Olp.  We are working on reapplying for [employer] and will get this started as soon as possible.  I reordered Singulair for him downstairs so they can stop and pick up on the way out.  Myeloma studies for today are pending. He will get a new treatment and appointment calendar today. MD follow-up with next treatment.  We went back over possible side effects and things to look out for. They verbalized understanding.  They will contact our office with any questions or concerns. We can certainly see him sooner if need be.   Laverna Peace, NP 3/19/202011:52 AM

## 2018-10-29 NOTE — Patient Instructions (Addendum)
Acetaminophen tablets or caplets What is this medicine? ACETAMINOPHEN (a set a MEE noe fen) is a pain reliever. It is used to treat mild pain and fever. This medicine may be used for other purposes; ask your health care provider or pharmacist if you have questions. COMMON BRAND NAME(S): Aceta, Actamin, Anacin Aspirin Free, Genapap, Genebs, Mapap, Pain & Fever, Pain and Fever, PAIN RELIEF, PAIN RELIEF Extra Strength, Pain Reliever, Panadol, PHARBETOL, Q-Pap, Q-Pap Extra Strength, Tylenol, Tylenol CrushableTablet, Tylenol Extra Strength, XS No Aspirin, XS Pain Reliever What should I tell my health care provider before I take this medicine? They need to know if you have any of these conditions: -if you often drink alcohol -liver disease -an unusual or allergic reaction to acetaminophen, other medicines, foods, dyes, or preservatives -pregnant or trying to get pregnant -breast-feeding How should I use this medicine? Take this medicine by mouth with a glass of water. Follow the directions on the package or prescription label. Take your medicine at regular intervals. Do not take your medicine more often than directed. Talk to your pediatrician regarding the use of this medicine in children. While this drug may be prescribed for children as young as 12 years of age for selected conditions, precautions do apply. Overdosage: If you think you have taken too much of this medicine contact a poison control center or emergency room at once. NOTE: This medicine is only for you. Do not share this medicine with others. What if I miss a dose? If you miss a dose, take it as soon as you can. If it is almost time for your next dose, take only that dose. Do not take double or extra doses. What may interact with this medicine? -alcohol -imatinib -isoniazid -other medicines with acetaminophen This list may not describe all possible interactions. Give your health care provider a list of all the medicines, herbs,  non-prescription drugs, or dietary supplements you use. Also tell them if you smoke, drink alcohol, or use illegal drugs. Some items may interact with your medicine. What should I watch for while using this medicine? Tell your doctor or health care professional if the pain lasts more than 10 days (5 days for children), if it gets worse, or if there is a new or different kind of pain. Also, check with your doctor if a fever lasts for more than 3 days. Do not take other medicines that contain acetaminophen with this medicine. Always read labels carefully. If you have questions, ask your doctor or pharmacist. If you take too much acetaminophen get medical help right away. Too much acetaminophen can be very dangerous and cause liver damage. Even if you do not have symptoms, it is important to get help right away. What side effects may I notice from receiving this medicine? Side effects that you should report to your doctor or health care professional as soon as possible: -allergic reactions like skin rash, itching or hives, swelling of the face, lips, or tongue -breathing problems -fever or sore throat -redness, blistering, peeling or loosening of the skin, including inside the mouth -trouble passing urine or change in the amount of urine -unusual bleeding or bruising -unusually weak or tired -yellowing of the eyes or skin Side effects that usually do not require medical attention (report to your doctor or health care professional if they continue or are bothersome): -headache -nausea, stomach upset This list may not describe all possible side effects. Call your doctor for medical advice about side effects. You may report side effects  to FDA at 1-800-FDA-1088. Where should I keep my medicine? Keep out of reach of children. Store at room temperature between 20 and 25 degrees C (68 and 77 degrees F). Protect from moisture and heat. Throw away any unused medicine after the expiration date. NOTE: This  sheet is a summary. It may not cover all possible information. If you have questions about this medicine, talk to your doctor, pharmacist, or health care provider.  2019 Elsevier/Gold Standard (2013-03-22 12:54:16) Prochlorperazine tablets What is this medicine? PROCHLORPERAZINE (proe klor PER a zeen) helps to control severe nausea and vomiting. This medicine is also used to treat schizophrenia. It can also help patients who experience anxiety that is not due to psychological illness. This medicine may be used for other purposes; ask your health care provider or pharmacist if you have questions. COMMON BRAND NAME(S): Compazine What should I tell my health care provider before I take this medicine? They need to know if you have any of these conditions: -blood disorders or disease -dementia -liver disease or jaundice -Parkinson's disease -uncontrollable movement disorder -an unusual or allergic reaction to prochlorperazine, other medicines, foods, dyes, or preservatives -pregnant or trying to get pregnant -breast-feeding How should I use this medicine? Take this medicine by mouth with a glass of water. Follow the directions on the prescription label. Take your doses at regular intervals. Do not take your medicine more often than directed. Do not stop taking this medicine suddenly. This can cause nausea, vomiting, and dizziness. Ask your doctor or health care professional for advice. Talk to your pediatrician regarding the use of this medicine in children. Special care may be needed. While this drug may be prescribed for children as young as 2 years for selected conditions, precautions do apply. Overdosage: If you think you have taken too much of this medicine contact a poison control center or emergency room at once. NOTE: This medicine is only for you. Do not share this medicine with others. What if I miss a dose? If you miss a dose, take it as soon as you can. If it is almost time for your  next dose, take only that dose. Do not take double or extra doses. What may interact with this medicine? Do not take this medicine with any of the following medications: -amoxapine -antidepressants like citalopram, escitalopram, fluoxetine, paroxetine, and sertraline -deferoxamine -dofetilide -maprotiline -tricyclic antidepressants like amitriptyline, clomipramine, imipramine, nortiptyline and others This medicine may also interact with the following medications: -lithium -medicines for pain -phenytoin -propranolol -warfarin This list may not describe all possible interactions. Give your health care provider a list of all the medicines, herbs, non-prescription drugs, or dietary supplements you use. Also tell them if you smoke, drink alcohol, or use illegal drugs. Some items may interact with your medicine. What should I watch for while using this medicine? Visit your doctor or health care professional for regular checks on your progress. You may get drowsy or dizzy. Do not drive, use machinery, or do anything that needs mental alertness until you know how this medicine affects you. Do not stand or sit up quickly, especially if you are an older patient. This reduces the risk of dizzy or fainting spells. Alcohol may interfere with the effect of this medicine. Avoid alcoholic drinks. This medicine can reduce the response of your body to heat or cold. Dress warm in cold weather and stay hydrated in hot weather. If possible, avoid extreme temperatures like saunas, hot tubs, very hot or cold showers, or activities that  can cause dehydration such as vigorous exercise. This medicine can make you more sensitive to the sun. Keep out of the sun. If you cannot avoid being in the sun, wear protective clothing and use sunscreen. Do not use sun lamps or tanning beds/booths. Your mouth may get dry. Chewing sugarless gum or sucking hard candy, and drinking plenty of water may help. Contact your doctor if the  problem does not go away or is severe. What side effects may I notice from receiving this medicine? Side effects that you should report to your doctor or health care professional as soon as possible: -blurred vision -breast enlargement in men or women -breast milk in women who are not breast-feeding -chest pain, fast or irregular heartbeat -confusion, restlessness -dark yellow or brown urine -difficulty breathing or swallowing -dizziness or fainting spells -drooling, shaking, movement difficulty (shuffling walk) or rigidity -fever, chills, sore throat -involuntary or uncontrollable movements of the eyes, mouth, head, arms, and legs -seizures -stomach area pain -unusually weak or tired -unusual bleeding or bruising -yellowing of skin or eyes Side effects that usually do not require medical attention (report to your doctor or health care professional if they continue or are bothersome): -difficulty passing urine -difficulty sleeping -headache -sexual dysfunction -skin rash, or itching This list may not describe all possible side effects. Call your doctor for medical advice about side effects. You may report side effects to FDA at 1-800-FDA-1088. Where should I keep my medicine? Keep out of the reach of children. Store at room temperature between 15 and 30 degrees C (59 and 86 degrees F). Protect from light. Throw away any unused medicine after the expiration date. NOTE: This sheet is a summary. It may not cover all possible information. If you have questions about this medicine, talk to your doctor, pharmacist, or health care provider.  2019 Elsevier/Gold Standard (2011-12-17 16:59:39) Famotidine injection What is this medicine? FAMOTIDINE (fa MOE ti deen) is a type of antihistamine that blocks the release of stomach acid. It is used to treat stomach or intestinal ulcers. It can relieve ulcer pain and discomfort, and the heartburn from acid reflux. This medicine may be used for other  purposes; ask your health care provider or pharmacist if you have questions. COMMON BRAND NAME(S): Pepcid What should I tell my health care provider before I take this medicine? They need to know if you have any of these conditions: -kidney or liver disease -an unusual or allergic reaction to famotidine, other medicines, foods, dyes, or preservatives -pregnant or trying to get pregnant -breast-feeding How should I use this medicine? This medicine is for infusion into a vein. It is given by a health care professional in a hospital or clinic setting. Talk to your pediatrician regarding the use of this medicine in children. Special care may be needed. Overdosage: If you think you have taken too much of this medicine contact a poison control center or emergency room at once. NOTE: This medicine is only for you. Do not share this medicine with others. What if I miss a dose? This does not apply. What may interact with this medicine? -delavirdine -itraconazole -ketoconazole This list may not describe all possible interactions. Give your health care provider a list of all the medicines, herbs, non-prescription drugs, or dietary supplements you use. Also tell them if you smoke, drink alcohol, or use illegal drugs. Some items may interact with your medicine. What should I watch for while using this medicine? Tell your doctor or health care professional if your  condition does not start to get better or gets worse. Do not take with aspirin, ibuprofen, or other antiinflammatory medicines. These can aggravate your condition. Do not smoke cigarettes or drink alcohol. These increase irritation in your stomach and can increase the time it will take for ulcers to heal. Cigarettes and alcohol can also worsen acid reflux or heartburn. If you get black, tarry stools or vomit up what looks like coffee grounds, call your doctor or health care professional at once. You may have a bleeding ulcer. This medicine may  cause a decrease in vitamin B12. You should make sure that you get enough vitamin B12 while you are taking this medicine. Discuss the foods you eat and the vitamins you take with your health care professional. What side effects may I notice from receiving this medicine? Side effects that you should report to your doctor or health care professional as soon as possible: -allergic reactions like skin rash, itching or hives, swelling of the face, lips, or tongue -agitation, nervousness -confusion -hallucinations Side effects that usually do not require medical attention (report to your doctor or health care professional if they continue or are bothersome): -constipation -diarrhea -dizziness -headache This list may not describe all possible side effects. Call your doctor for medical advice about side effects. You may report side effects to FDA at 1-800-FDA-1088. Where should I keep my medicine? This medicine is given in a hospital or clinic. You will not be given this medicine to store at home. NOTE: This sheet is a summary. It may not cover all possible information. If you have questions about this medicine, talk to your doctor, pharmacist, or health care provider.  2019 Elsevier/Gold Standard (2017-03-14 13:16:46) Dexamethasone injection What is this medicine? DEXAMETHASONE (dex a METH a sone) is a corticosteroid. It is used to treat inflammation of the skin, joints, lungs, and other organs. Common conditions treated include asthma, allergies, and arthritis. It is also used for other conditions, like blood disorders and diseases of the adrenal glands. This medicine may be used for other purposes; ask your health care provider or pharmacist if you have questions. COMMON BRAND NAME(S): Decadron, DoubleDex, Simplist Dexamethasone, Solurex What should I tell my health care provider before I take this medicine? They need to know if you have any of these conditions: -blood clotting  problems -Cushing's syndrome -diabetes -glaucoma -heart problems or disease -high blood pressure -infection like herpes, measles, tuberculosis, or chickenpox -kidney disease -liver disease -mental problems -myasthenia gravis -osteoporosis -previous heart attack -seizures -stomach, ulcer or intestine disease including colitis and diverticulitis -thyroid problem -an unusual or allergic reaction to dexamethasone, corticosteroids, other medicines, lactose, foods, dyes, or preservatives -pregnant or trying to get pregnant -breast-feeding How should I use this medicine? This medicine is for injection into a muscle, joint, lesion, soft tissue, or vein. It is given by a health care professional in a hospital or clinic setting. Talk to your pediatrician regarding the use of this medicine in children. Special care may be needed. Overdosage: If you think you have taken too much of this medicine contact a poison control center or emergency room at once. NOTE: This medicine is only for you. Do not share this medicine with others. What if I miss a dose? This may not apply. If you are having a series of injections over a prolonged period, try not to miss an appointment. Call your doctor or health care professional to reschedule if you are unable to keep an appointment. What may interact with this  medicine? Do not take this medicine with any of the following medications: -mifepristone, RU-486 -vaccines This medicine may also interact with the following medications: -amphotericin B -antibiotics like clarithromycin, erythromycin, and troleandomycin -aspirin and aspirin-like drugs -barbiturates like phenobarbital -carbamazepine -cholestyramine -cholinesterase inhibitors like donepezil, galantamine, rivastigmine, and tacrine -cyclosporine -digoxin -diuretics -ephedrine -male hormones, like estrogens or progestins and birth control pills -indinavir -isoniazid -ketoconazole -medicines for  diabetes -medicines that improve muscle tone or strength for conditions like myasthenia gravis -NSAIDs, medicines for pain and inflammation, like ibuprofen or naproxen -phenytoin -rifampin -thalidomide -warfarin This list may not describe all possible interactions. Give your health care provider a list of all the medicines, herbs, non-prescription drugs, or dietary supplements you use. Also tell them if you smoke, drink alcohol, or use illegal drugs. Some items may interact with your medicine. What should I watch for while using this medicine? Your condition will be monitored carefully while you are receiving this medicine. If you are taking this medicine for a long time, carry an identification card with your name and address, the type and dose of your medicine, and your doctor's name and address. This medicine may increase your risk of getting an infection. Stay away from people who are sick. Tell your doctor or health care professional if you are around anyone with measles or chickenpox. Talk to your health care provider before you get any vaccines that you take this medicine. If you are going to have surgery, tell your doctor or health care professional that you have taken this medicine within the last twelve months. Ask your doctor or health care professional about your diet. You may need to lower the amount of salt you eat. The medicine can increase your blood sugar. If you are a diabetic check with your doctor if you need help adjusting the dose of your diabetic medicine. What side effects may I notice from receiving this medicine? Side effects that you should report to your doctor or health care professional as soon as possible: -allergic reactions like skin rash, itching or hives, swelling of the face, lips, or tongue -black or tarry stools -change in the amount of urine -changes in vision -confusion, excitement, restlessness, a false sense of well-being -fever, sore throat, sneezing,  cough, or other signs of infection, wounds that will not heal -hallucinations -increased thirst -mental depression, mood swings, mistaken feelings of self importance or of being mistreated -pain in hips, back, ribs, arms, shoulders, or legs -pain, redness, or irritation at the injection site -redness, blistering, peeling or loosening of the skin, including inside the mouth -rounding out of face -swelling of feet or lower legs -unusual bleeding or bruising -unusual tired or weak -wounds that do not heal Side effects that usually do not require medical attention (report to your doctor or health care professional if they continue or are bothersome): -diarrhea or constipation -change in taste -headache -nausea, vomiting -skin problems, acne, thin and shiny skin -touble sleeping -unusual growth of hair on the face or body -weight gain This list may not describe all possible side effects. Call your doctor for medical advice about side effects. You may report side effects to FDA at 1-800-FDA-1088. Where should I keep my medicine? This drug is given in a hospital or clinic and will not be stored at home. NOTE: This sheet is a summary. It may not cover all possible information. If you have questions about this medicine, talk to your doctor, pharmacist, or health care provider.  2019 Elsevier/Gold Standard (2007-11-19 14:04:12)  Coronavirus (COVID-19) Are you at risk?  Are you at risk for the Coronavirus (COVID-19)?  To be considered HIGH RISK for Coronavirus (COVID-19), you have to meet the following criteria:  . Traveled to [place], [place], [place], [place] or [place]; or in the [place] to [place], [place], [place], or [place]; and have fever, cough, and shortness of breath within the last 2 weeks of travel OR . Been in close contact with a person diagnosed with COVID-19 within the last 2 weeks and have fever, cough, and shortness of breath . IF YOU DO NOT MEET THESE  CRITERIA, YOU ARE CONSIDERED LOW RISK FOR COVID-19.  What to do if you are HIGH RISK for COVID-19?  Marland Kitchen If you are having a medical emergency, call 911. . Seek medical care right away. Before you go to a doctor's office, urgent care or emergency department, call ahead and tell them about your recent travel, contact with someone diagnosed with COVID-19, and your symptoms. You should receive instructions from your physician's office regarding next steps of care.  . When you arrive at healthcare provider, tell the healthcare staff immediately you have returned from visiting [place], [place], [place], [place] or [place]; or traveled in the [place] to [place], [place], [place], or [place]; in the last two weeks or you have been in close contact with a person diagnosed with COVID-19 in the last 2 weeks.   . Tell the health care staff about your symptoms: fever, cough and shortness of breath. . After you have been seen by a medical provider, you will be either: o Tested for (COVID-19) and discharged home on quarantine except to seek medical care if symptoms worsen, and asked to  - Stay home and avoid contact with others until you get your results (4-5 days)  - Avoid travel on public transportation if possible (such as bus, train, or airplane) or o Sent to the Emergency Department by EMS for evaluation, COVID-19 testing, and possible admission depending on your condition and test results.  What to do if you are LOW RISK for COVID-19?  Reduce your risk of any infection by using the same precautions used for avoiding the common cold or flu:  Marland Kitchen Wash your hands often with soap and warm water for at least 20 seconds.  If soap and water are not readily available, use an alcohol-based hand sanitizer with at least 60% alcohol.  . If coughing or sneezing, cover your mouth and nose by coughing or sneezing into the elbow areas of your shirt or coat, into a tissue or into your sleeve (not your  hands). . Avoid shaking hands with others and consider head nods or verbal greetings only. . Avoid touching your eyes, nose, or mouth with unwashed hands.  . Avoid close contact with people who are sick. . Avoid places or events with large numbers of people in one location, like concerts or sporting events. . Carefully consider travel plans you have or are making. . If you are planning any travel outside or inside the [place], visit the CDC's Travelers' Health webpage for the latest health notices. . If you have some symptoms but not all symptoms, continue to monitor at home and seek medical attention if your symptoms worsen. . If you are having a medical emergency, call 911.   ADDITIONAL HEALTHCARE OPTIONS FOR PATIENTS  [facility] / e-Visit: [email or web address]         [facility]: [phone]  [facility]  [facility]: [facility]: [phone]

## 2018-10-29 NOTE — Telephone Encounter (Signed)
Prescription for Pomalyst sent to [facility]. Auth# [id number]

## 2018-10-30 ENCOUNTER — Other Ambulatory Visit: Payer: Self-pay

## 2018-10-30 ENCOUNTER — Ambulatory Visit
Admission: RE | Admit: 2018-10-30 | Discharge: 2018-10-30 | Disposition: A | Discharge status: Home / Self Care | Payer: Medicare HMO | Source: Ambulatory Visit | Attending: Radiation Oncology | Admitting: Radiation Oncology

## 2018-10-30 DIAGNOSIS — C9002 Multiple myeloma in relapse: Secondary | ICD-10-CM | POA: Diagnosis not present

## 2018-10-30 DIAGNOSIS — Z510 Encounter for antineoplastic radiation therapy: Secondary | ICD-10-CM | POA: Diagnosis not present

## 2018-10-30 DIAGNOSIS — C7951 Secondary malignant neoplasm of bone: Secondary | ICD-10-CM | POA: Diagnosis not present

## 2018-10-30 LAB — IGG, IGA, IGM
IgA: 82 mg/dL (ref 61–437)
IgG (Immunoglobin G), Serum: 2603 mg/dL — ABNORMAL HIGH (ref 700–1600)
IgM (Immunoglobulin M), Srm: 38 mg/dL (ref 15–143)

## 2018-10-30 LAB — KAPPA/LAMBDA LIGHT CHAINS
Kappa free light chain: 22.2 mg/L — ABNORMAL HIGH (ref 3.3–19.4)
Kappa, lambda light chain ratio: 0.03 — ABNORMAL LOW (ref 0.26–1.65)
Lambda free light chains: 776.9 mg/L — ABNORMAL HIGH (ref 5.7–26.3)

## 2018-11-02 ENCOUNTER — Ambulatory Visit
Admission: RE | Admit: 2018-11-02 | Discharge: 2018-11-02 | Disposition: A | Discharge status: Home / Self Care | Payer: Medicare HMO | Source: Ambulatory Visit | Attending: Radiation Oncology | Admitting: Radiation Oncology

## 2018-11-02 ENCOUNTER — Other Ambulatory Visit: Payer: Self-pay

## 2018-11-02 DIAGNOSIS — Z510 Encounter for antineoplastic radiation therapy: Secondary | ICD-10-CM | POA: Diagnosis not present

## 2018-11-03 ENCOUNTER — Other Ambulatory Visit: Payer: Self-pay

## 2018-11-03 ENCOUNTER — Ambulatory Visit: Payer: Medicare HMO

## 2018-11-03 ENCOUNTER — Ambulatory Visit
Admission: RE | Admit: 2018-11-03 | Discharge: 2018-11-03 | Disposition: A | Discharge status: Home / Self Care | Payer: Medicare HMO | Source: Ambulatory Visit | Attending: Radiation Oncology | Admitting: Radiation Oncology

## 2018-11-03 DIAGNOSIS — Z510 Encounter for antineoplastic radiation therapy: Secondary | ICD-10-CM | POA: Diagnosis not present

## 2018-11-03 LAB — PROTEIN ELECTROPHORESIS, SERUM, WITH REFLEX
A/G Ratio: 0.8 (ref 0.7–1.7)
Albumin ELP: 3.1 g/dL (ref 2.9–4.4)
Alpha-1-Globulin: 0.3 g/dL (ref 0.0–0.4)
Alpha-2-Globulin: 0.8 g/dL (ref 0.4–1.0)
Beta Globulin: 0.7 g/dL (ref 0.7–1.3)
GAMMA GLOBULIN: 2 g/dL — AB (ref 0.4–1.8)
Globulin, Total: 3.8 g/dL (ref 2.2–3.9)
M-Spike, %: 1.7 g/dL — ABNORMAL HIGH
SPEP Interpretation: 0
Total Protein ELP: 6.9 g/dL (ref 6.0–8.5)

## 2018-11-03 LAB — IMMUNOFIXATION REFLEX, SERUM
IgA: 83 mg/dL (ref 61–437)
IgG (Immunoglobin G), Serum: 2534 mg/dL — ABNORMAL HIGH (ref 700–1600)
IgM (Immunoglobulin M), Srm: 35 mg/dL (ref 15–143)

## 2018-11-04 ENCOUNTER — Ambulatory Visit
Admission: RE | Admit: 2018-11-04 | Discharge: 2018-11-04 | Disposition: A | Discharge status: Home / Self Care | Payer: Medicare HMO | Source: Ambulatory Visit | Attending: Radiation Oncology | Admitting: Radiation Oncology

## 2018-11-04 ENCOUNTER — Other Ambulatory Visit: Payer: Self-pay

## 2018-11-04 DIAGNOSIS — Z510 Encounter for antineoplastic radiation therapy: Secondary | ICD-10-CM | POA: Diagnosis not present

## 2018-11-05 ENCOUNTER — Ambulatory Visit: Payer: Medicare HMO

## 2018-11-05 ENCOUNTER — Inpatient Hospital Stay: Payer: Medicare HMO

## 2018-11-05 ENCOUNTER — Ambulatory Visit
Admission: RE | Admit: 2018-11-05 | Discharge: 2018-11-05 | Disposition: A | Discharge status: Home / Self Care | Payer: Medicare HMO | Source: Ambulatory Visit | Attending: Radiation Oncology | Admitting: Radiation Oncology

## 2018-11-05 ENCOUNTER — Other Ambulatory Visit: Payer: Self-pay

## 2018-11-05 ENCOUNTER — Other Ambulatory Visit: Payer: Medicare HMO

## 2018-11-05 VITALS — BP 132/61 | HR 68 | Temp 98.2°F | Resp 18

## 2018-11-05 DIAGNOSIS — C9000 Multiple myeloma not having achieved remission: Secondary | ICD-10-CM

## 2018-11-05 DIAGNOSIS — D508 Other iron deficiency anemias: Secondary | ICD-10-CM

## 2018-11-05 DIAGNOSIS — C9002 Multiple myeloma in relapse: Secondary | ICD-10-CM

## 2018-11-05 DIAGNOSIS — Z5112 Encounter for antineoplastic immunotherapy: Secondary | ICD-10-CM | POA: Diagnosis not present

## 2018-11-05 DIAGNOSIS — Z510 Encounter for antineoplastic radiation therapy: Secondary | ICD-10-CM | POA: Diagnosis not present

## 2018-11-05 LAB — CBC WITH DIFFERENTIAL (CANCER CENTER ONLY)
Abs Immature Granulocytes: 0.01 10*3/uL (ref 0.00–0.07)
BASOS PCT: 1 %
Basophils Absolute: 0.1 10*3/uL (ref 0.0–0.1)
Eosinophils Absolute: 0.3 10*3/uL (ref 0.0–0.5)
Eosinophils Relative: 8 %
HCT: 33.4 % — ABNORMAL LOW (ref 39.0–52.0)
Hemoglobin: 10.8 g/dL — ABNORMAL LOW (ref 13.0–17.0)
Immature Granulocytes: 0 %
Lymphocytes Relative: 7 %
Lymphs Abs: 0.3 10*3/uL — ABNORMAL LOW (ref 0.7–4.0)
MCH: 29.4 pg (ref 26.0–34.0)
MCHC: 32.3 g/dL (ref 30.0–36.0)
MCV: 91 fL (ref 80.0–100.0)
MONO ABS: 0.6 10*3/uL (ref 0.1–1.0)
Monocytes Relative: 13 %
NEUTROS ABS: 2.9 10*3/uL (ref 1.7–7.7)
Neutrophils Relative %: 71 %
Platelet Count: 181 10*3/uL (ref 150–400)
RBC: 3.67 MIL/uL — AB (ref 4.22–5.81)
RDW: 13.9 % (ref 11.5–15.5)
WBC Count: 4.1 10*3/uL (ref 4.0–10.5)
nRBC: 0 % (ref 0.0–0.2)

## 2018-11-05 LAB — CMP (CANCER CENTER ONLY)
ALT: 13 U/L (ref 0–44)
AST: 18 U/L (ref 15–41)
Albumin: 3.3 g/dL — ABNORMAL LOW (ref 3.5–5.0)
Alkaline Phosphatase: 84 U/L (ref 38–126)
Anion gap: 6 (ref 5–15)
BUN: 22 mg/dL (ref 8–23)
CO2: 25 mmol/L (ref 22–32)
Calcium: 8.7 mg/dL — ABNORMAL LOW (ref 8.9–10.3)
Chloride: 109 mmol/L (ref 98–111)
Creatinine: 1.39 mg/dL — ABNORMAL HIGH (ref 0.61–1.24)
GFR, Est AFR Am: 53 mL/min — ABNORMAL LOW (ref >60)
GFR, Estimated: 46 mL/min — ABNORMAL LOW (ref >60)
Glucose, Bld: 131 mg/dL — ABNORMAL HIGH (ref 70–99)
Potassium: 4 mmol/L (ref 3.5–5.1)
Sodium: 140 mmol/L (ref 135–145)
Total Bilirubin: 0.7 mg/dL (ref 0.3–1.2)
Total Protein: 7 g/dL (ref 6.5–8.1)

## 2018-11-05 LAB — IRON AND TIBC
Iron: 77 ug/dL (ref 42–163)
Saturation Ratios: 37 % (ref 20–55)
TIBC: 208 ug/dL (ref 202–409)
UIBC: 131 ug/dL (ref 117–376)

## 2018-11-05 LAB — LACTATE DEHYDROGENASE: LDH: 216 U/L — ABNORMAL HIGH (ref 98–192)

## 2018-11-05 LAB — FERRITIN: Ferritin: 147 ng/mL (ref 24–336)

## 2018-11-05 MED ORDER — SODIUM CHLORIDE 0.9 % IV SOLN
20.0000 mg | Freq: Once | INTRAVENOUS | Status: AC
  Administered 2018-11-05: 20 mg via INTRAVENOUS
  Filled 2018-11-05: qty 2

## 2018-11-05 MED ORDER — ACETAMINOPHEN 325 MG PO TABS
ORAL_TABLET | ORAL | Status: AC
  Filled 2018-11-05: qty 2

## 2018-11-05 MED ORDER — FAMOTIDINE IN NACL 20-0.9 MG/50ML-% IV SOLN: 20.0000 mg | Freq: Once | INTRAVENOUS | Status: DC

## 2018-11-05 MED ORDER — PROCHLORPERAZINE MALEATE 10 MG PO TABS
10.0000 mg | ORAL_TABLET | Freq: Once | ORAL | Status: AC
  Administered 2018-11-05: 10 mg via ORAL

## 2018-11-05 MED ORDER — DEXAMETHASONE SODIUM PHOSPHATE 10 MG/ML IJ SOLN
INTRAMUSCULAR | Status: AC
  Filled 2018-11-05: qty 1

## 2018-11-05 MED ORDER — DEXAMETHASONE SODIUM PHOSPHATE 10 MG/ML IJ SOLN
8.0000 mg | Freq: Once | INTRAMUSCULAR | Status: AC
  Administered 2018-11-05: 8 mg via INTRAVENOUS

## 2018-11-05 MED ORDER — HEPARIN SOD (PORK) LOCK FLUSH 100 UNIT/ML IV SOLN
500.0000 [IU] | Freq: Once | INTRAVENOUS | Status: DC | PRN
  Filled 2018-11-05: qty 5

## 2018-11-05 MED ORDER — ACETAMINOPHEN 325 MG PO TABS
650.0000 mg | ORAL_TABLET | Freq: Once | ORAL | Status: AC
  Administered 2018-11-05: 650 mg via ORAL

## 2018-11-05 MED ORDER — SODIUM CHLORIDE 0.9 % IV SOLN
10.0000 mg/kg | Freq: Once | INTRAVENOUS | Status: AC
  Administered 2018-11-05: 700 mg via INTRAVENOUS
  Filled 2018-11-05: qty 16

## 2018-11-05 MED ORDER — SODIUM CHLORIDE 0.9 % IV SOLN
Freq: Once | INTRAVENOUS | Status: AC
  Administered 2018-11-05: 09:00:00 via INTRAVENOUS
  Filled 2018-11-05: qty 250

## 2018-11-05 MED ORDER — SODIUM CHLORIDE 0.9% FLUSH
10.0000 mL | INTRAVENOUS | Status: DC | PRN
  Filled 2018-11-05: qty 10

## 2018-11-05 MED ORDER — DIPHENHYDRAMINE HCL 25 MG PO CAPS
ORAL_CAPSULE | ORAL | Status: AC
  Filled 2018-11-05: qty 2

## 2018-11-05 MED ORDER — PROCHLORPERAZINE MALEATE 10 MG PO TABS
ORAL_TABLET | ORAL | Status: AC
  Filled 2018-11-05: qty 1

## 2018-11-05 MED ORDER — DIPHENHYDRAMINE HCL 25 MG PO CAPS
50.0000 mg | ORAL_CAPSULE | Freq: Once | ORAL | Status: AC
  Administered 2018-11-05: 50 mg via ORAL

## 2018-11-06 ENCOUNTER — Ambulatory Visit
Admission: RE | Admit: 2018-11-06 | Discharge: 2018-11-06 | Disposition: A | Discharge status: Home / Self Care | Payer: Medicare HMO | Source: Ambulatory Visit | Attending: Radiation Oncology | Admitting: Radiation Oncology

## 2018-11-06 ENCOUNTER — Other Ambulatory Visit: Payer: Self-pay

## 2018-11-06 DIAGNOSIS — Z510 Encounter for antineoplastic radiation therapy: Secondary | ICD-10-CM | POA: Diagnosis not present

## 2018-11-06 DIAGNOSIS — C7951 Secondary malignant neoplasm of bone: Secondary | ICD-10-CM | POA: Diagnosis not present

## 2018-11-06 DIAGNOSIS — C9002 Multiple myeloma in relapse: Secondary | ICD-10-CM | POA: Diagnosis not present

## 2018-11-09 ENCOUNTER — Encounter: Payer: Self-pay | Admitting: Radiation Oncology

## 2018-11-09 NOTE — Progress Notes (Signed)
  Radiation Oncology         [phone] ________________________________  Name: Christian Burns MRN: [id number]  Date: 11/09/2018  DOB: 01-18-1930  End of Treatment Note  Diagnosis:   Multiple myeloma in relapse     Indication for treatment:  Palliative       Radiation treatment dates:   10/26/18-11/06/18   Site/dose:   Right femur; 10 fractions of 3 Gy for a total of 30 Gy  Beams/energy:   Photon Isodose; 10X  Narrative: The patient tolerated radiation treatment relatively well.     Pt denied fatigue and skin changes throughout treatments. Towards the end of treatment, pt reported mild hip soreness but he could still ambulate with his walker.  Overall the pt was without complaints.   Plan: The patient has completed radiation treatment. The patient will return to radiation oncology clinic for routine followup in one month. I advised them to call or return sooner if they have any questions or concerns related to their recovery or treatment.  -----------------------------------  Blair Promise, PhD, MD  This document serves as a record of services personally performed by Gery Pray, MD. It was created on his behalf by Mary-Margaret Loma Messing, a trained medical scribe. The creation of this record is based on the scribe's personal observations and the provider's statements to them. This document has been checked and approved by the attending provider.

## 2018-11-11 ENCOUNTER — Other Ambulatory Visit: Payer: Self-pay | Admitting: *Deleted

## 2018-11-11 ENCOUNTER — Other Ambulatory Visit: Payer: Self-pay | Admitting: Hematology & Oncology

## 2018-11-11 DIAGNOSIS — C9002 Multiple myeloma in relapse: Secondary | ICD-10-CM

## 2018-11-11 DIAGNOSIS — C9000 Multiple myeloma not having achieved remission: Secondary | ICD-10-CM

## 2018-11-12 ENCOUNTER — Inpatient Hospital Stay: Payer: Medicare HMO | Attending: Hematology & Oncology

## 2018-11-12 ENCOUNTER — Other Ambulatory Visit: Payer: Self-pay

## 2018-11-12 ENCOUNTER — Inpatient Hospital Stay: Payer: Medicare HMO

## 2018-11-12 VITALS — BP 134/67 | HR 71 | Temp 98.1°F | Resp 18

## 2018-11-12 DIAGNOSIS — Z923 Personal history of irradiation: Secondary | ICD-10-CM | POA: Insufficient documentation

## 2018-11-12 DIAGNOSIS — C9002 Multiple myeloma in relapse: Secondary | ICD-10-CM | POA: Insufficient documentation

## 2018-11-12 DIAGNOSIS — N289 Disorder of kidney and ureter, unspecified: Secondary | ICD-10-CM | POA: Insufficient documentation

## 2018-11-12 DIAGNOSIS — C9000 Multiple myeloma not having achieved remission: Secondary | ICD-10-CM

## 2018-11-12 DIAGNOSIS — Z79899 Other long term (current) drug therapy: Secondary | ICD-10-CM | POA: Insufficient documentation

## 2018-11-12 DIAGNOSIS — D649 Anemia, unspecified: Secondary | ICD-10-CM | POA: Diagnosis not present

## 2018-11-12 DIAGNOSIS — Z5112 Encounter for antineoplastic immunotherapy: Secondary | ICD-10-CM | POA: Diagnosis not present

## 2018-11-12 LAB — CMP (CANCER CENTER ONLY)
ALT: 12 U/L (ref 0–44)
AST: 18 U/L (ref 15–41)
Albumin: 3.3 g/dL — ABNORMAL LOW (ref 3.5–5.0)
Alkaline Phosphatase: 78 U/L (ref 38–126)
Anion gap: 6 (ref 5–15)
BUN: 33 mg/dL — ABNORMAL HIGH (ref 8–23)
CO2: 26 mmol/L (ref 22–32)
Calcium: 8.9 mg/dL (ref 8.9–10.3)
Chloride: 106 mmol/L (ref 98–111)
Creatinine: 1.45 mg/dL — ABNORMAL HIGH (ref 0.61–1.24)
GFR, Est AFR Am: 50 mL/min — ABNORMAL LOW (ref >60)
GFR, Estimated: 43 mL/min — ABNORMAL LOW (ref >60)
Glucose, Bld: 110 mg/dL — ABNORMAL HIGH (ref 70–99)
Potassium: 4.4 mmol/L (ref 3.5–5.1)
Sodium: 138 mmol/L (ref 135–145)
Total Bilirubin: 0.6 mg/dL (ref 0.3–1.2)
Total Protein: 7.5 g/dL (ref 6.5–8.1)

## 2018-11-12 LAB — CBC WITH DIFFERENTIAL (CANCER CENTER ONLY)
Abs Immature Granulocytes: 0.03 10*3/uL (ref 0.00–0.07)
Basophils Absolute: 0 10*3/uL (ref 0.0–0.1)
Basophils Relative: 1 %
Eosinophils Absolute: 0.3 10*3/uL (ref 0.0–0.5)
Eosinophils Relative: 5 %
HCT: 32.2 % — ABNORMAL LOW (ref 39.0–52.0)
Hemoglobin: 10.6 g/dL — ABNORMAL LOW (ref 13.0–17.0)
Immature Granulocytes: 1 %
Lymphocytes Relative: 5 %
Lymphs Abs: 0.3 10*3/uL — ABNORMAL LOW (ref 0.7–4.0)
MCH: 29.9 pg (ref 26.0–34.0)
MCHC: 32.9 g/dL (ref 30.0–36.0)
MCV: 91 fL (ref 80.0–100.0)
Monocytes Absolute: 0.8 10*3/uL (ref 0.1–1.0)
Monocytes Relative: 14 %
Neutro Abs: 4.2 10*3/uL (ref 1.7–7.7)
Neutrophils Relative %: 74 %
Platelet Count: 157 10*3/uL (ref 150–400)
RBC: 3.54 MIL/uL — ABNORMAL LOW (ref 4.22–5.81)
RDW: 14.1 % (ref 11.5–15.5)
WBC Count: 5.6 10*3/uL (ref 4.0–10.5)
nRBC: 0 % (ref 0.0–0.2)

## 2018-11-12 MED ORDER — DEXAMETHASONE SODIUM PHOSPHATE 10 MG/ML IJ SOLN
INTRAMUSCULAR | Status: AC
  Filled 2018-11-12: qty 1

## 2018-11-12 MED ORDER — PROCHLORPERAZINE MALEATE 10 MG PO TABS
ORAL_TABLET | ORAL | Status: AC
  Filled 2018-11-12: qty 1

## 2018-11-12 MED ORDER — ACETAMINOPHEN 325 MG PO TABS
ORAL_TABLET | ORAL | Status: AC
  Filled 2018-11-12: qty 1

## 2018-11-12 MED ORDER — SODIUM CHLORIDE 0.9 % IV SOLN
Freq: Once | INTRAVENOUS | Status: AC
  Administered 2018-11-12: 12:00:00 via INTRAVENOUS
  Filled 2018-11-12: qty 250

## 2018-11-12 MED ORDER — ACETAMINOPHEN 325 MG PO TABS
ORAL_TABLET | ORAL | Status: AC
  Filled 2018-11-12: qty 2

## 2018-11-12 MED ORDER — DIPHENHYDRAMINE HCL 25 MG PO CAPS
ORAL_CAPSULE | ORAL | Status: AC
  Filled 2018-11-12: qty 2

## 2018-11-12 MED ORDER — DEXAMETHASONE SODIUM PHOSPHATE 10 MG/ML IJ SOLN
8.0000 mg | Freq: Once | INTRAMUSCULAR | Status: AC
  Administered 2018-11-12: 8 mg via INTRAVENOUS

## 2018-11-12 MED ORDER — DIPHENHYDRAMINE HCL 25 MG PO CAPS
50.0000 mg | ORAL_CAPSULE | Freq: Once | ORAL | Status: AC
  Administered 2018-11-12: 50 mg via ORAL

## 2018-11-12 MED ORDER — SODIUM CHLORIDE 0.9 % IV SOLN
10.0000 mg/kg | Freq: Once | INTRAVENOUS | Status: AC
  Administered 2018-11-12: 700 mg via INTRAVENOUS
  Filled 2018-11-12: qty 12

## 2018-11-12 MED ORDER — ACETAMINOPHEN 325 MG PO TABS
650.0000 mg | ORAL_TABLET | Freq: Once | ORAL | Status: AC
  Administered 2018-11-12: 12:00:00 650 mg via ORAL

## 2018-11-12 MED ORDER — FAMOTIDINE IN NACL 20-0.9 MG/50ML-% IV SOLN
INTRAVENOUS | Status: AC
  Filled 2018-11-12: qty 50

## 2018-11-12 MED ORDER — PROCHLORPERAZINE MALEATE 10 MG PO TABS
10.0000 mg | ORAL_TABLET | Freq: Once | ORAL | Status: AC
  Administered 2018-11-12: 12:00:00 10 mg via ORAL

## 2018-11-12 MED ORDER — FAMOTIDINE IN NACL 20-0.9 MG/50ML-% IV SOLN
20.0000 mg | Freq: Once | INTRAVENOUS | Status: AC
  Administered 2018-11-12: 20 mg via INTRAVENOUS

## 2018-11-12 NOTE — Patient Instructions (Signed)
[facility] Discharge Instructions for Patients Receiving Chemotherapy  Today you received the following chemotherapy agents: Empliciti  To help prevent nausea and vomiting after your treatment, we encourage you to take your nausea medication  as prescribed.    If you develop nausea and vomiting that is not controlled by your nausea medication, call the clinic.   BELOW ARE SYMPTOMS THAT SHOULD BE REPORTED IMMEDIATELY:  *FEVER GREATER THAN 100.5 F  *CHILLS WITH OR WITHOUT FEVER  NAUSEA AND VOMITING THAT IS NOT CONTROLLED WITH YOUR NAUSEA MEDICATION  *UNUSUAL SHORTNESS OF BREATH  *UNUSUAL BRUISING OR BLEEDING  TENDERNESS IN MOUTH AND THROAT WITH OR WITHOUT PRESENCE OF ULCERS  *URINARY PROBLEMS  *BOWEL PROBLEMS  UNUSUAL RASH Items with * indicate a potential emergency and should be followed up as soon as possible.  Feel free to call the clinic should you have any questions or concerns. The clinic phone number is [phone].  Please show the CHEMO ALERT CARD at check-in to the Emergency Department and triage nurse.   

## 2018-11-18 ENCOUNTER — Other Ambulatory Visit: Payer: Self-pay | Admitting: *Deleted

## 2018-11-18 DIAGNOSIS — C9002 Multiple myeloma in relapse: Secondary | ICD-10-CM

## 2018-11-18 DIAGNOSIS — C9000 Multiple myeloma not having achieved remission: Secondary | ICD-10-CM

## 2018-11-18 MED ORDER — POMALIDOMIDE 2 MG PO CAPS: 2.0000 mg | ORAL_CAPSULE | Freq: Every day | ORAL | 0 refills | Status: DC

## 2018-11-19 ENCOUNTER — Inpatient Hospital Stay: Payer: Medicare HMO

## 2018-11-19 ENCOUNTER — Other Ambulatory Visit: Payer: Self-pay

## 2018-11-19 VITALS — BP 140/63 | HR 68 | Temp 97.6°F | Resp 18

## 2018-11-19 DIAGNOSIS — C9002 Multiple myeloma in relapse: Secondary | ICD-10-CM

## 2018-11-19 DIAGNOSIS — D649 Anemia, unspecified: Secondary | ICD-10-CM | POA: Diagnosis not present

## 2018-11-19 DIAGNOSIS — Z79899 Other long term (current) drug therapy: Secondary | ICD-10-CM | POA: Diagnosis not present

## 2018-11-19 DIAGNOSIS — C9000 Multiple myeloma not having achieved remission: Secondary | ICD-10-CM

## 2018-11-19 DIAGNOSIS — N289 Disorder of kidney and ureter, unspecified: Secondary | ICD-10-CM | POA: Diagnosis not present

## 2018-11-19 DIAGNOSIS — Z923 Personal history of irradiation: Secondary | ICD-10-CM | POA: Diagnosis not present

## 2018-11-19 DIAGNOSIS — Z5112 Encounter for antineoplastic immunotherapy: Secondary | ICD-10-CM | POA: Diagnosis not present

## 2018-11-19 LAB — CBC WITH DIFFERENTIAL (CANCER CENTER ONLY)
Abs Immature Granulocytes: 0.03 10*3/uL (ref 0.00–0.07)
Basophils Absolute: 0.1 10*3/uL (ref 0.0–0.1)
Basophils Relative: 2 %
Eosinophils Absolute: 0.5 10*3/uL (ref 0.0–0.5)
Eosinophils Relative: 10 %
HCT: 31.6 % — ABNORMAL LOW (ref 39.0–52.0)
Hemoglobin: 10.2 g/dL — ABNORMAL LOW (ref 13.0–17.0)
Immature Granulocytes: 1 %
Lymphocytes Relative: 5 %
Lymphs Abs: 0.3 10*3/uL — ABNORMAL LOW (ref 0.7–4.0)
MCH: 29.4 pg (ref 26.0–34.0)
MCHC: 32.3 g/dL (ref 30.0–36.0)
MCV: 91.1 fL (ref 80.0–100.0)
Monocytes Absolute: 1.2 10*3/uL — ABNORMAL HIGH (ref 0.1–1.0)
Monocytes Relative: 23 %
Neutro Abs: 3.3 10*3/uL (ref 1.7–7.7)
Neutrophils Relative %: 59 %
Platelet Count: 175 10*3/uL (ref 150–400)
RBC: 3.47 MIL/uL — ABNORMAL LOW (ref 4.22–5.81)
RDW: 14.1 % (ref 11.5–15.5)
WBC Count: 5.4 10*3/uL (ref 4.0–10.5)
nRBC: 0 % (ref 0.0–0.2)

## 2018-11-19 LAB — CMP (CANCER CENTER ONLY)
ALT: 12 U/L (ref 0–44)
AST: 16 U/L (ref 15–41)
Albumin: 3.4 g/dL — ABNORMAL LOW (ref 3.5–5.0)
Alkaline Phosphatase: 85 U/L (ref 38–126)
Anion gap: 5 (ref 5–15)
BUN: 27 mg/dL — ABNORMAL HIGH (ref 8–23)
CO2: 25 mmol/L (ref 22–32)
Calcium: 8.5 mg/dL — ABNORMAL LOW (ref 8.9–10.3)
Chloride: 107 mmol/L (ref 98–111)
Creatinine: 1.37 mg/dL — ABNORMAL HIGH (ref 0.61–1.24)
GFR, Est AFR Am: 54 mL/min — ABNORMAL LOW (ref >60)
GFR, Estimated: 46 mL/min — ABNORMAL LOW (ref >60)
Glucose, Bld: 102 mg/dL — ABNORMAL HIGH (ref 70–99)
Potassium: 4 mmol/L (ref 3.5–5.1)
Sodium: 137 mmol/L (ref 135–145)
Total Bilirubin: 0.6 mg/dL (ref 0.3–1.2)
Total Protein: 7.3 g/dL (ref 6.5–8.1)

## 2018-11-19 MED ORDER — PROCHLORPERAZINE MALEATE 10 MG PO TABS
ORAL_TABLET | ORAL | Status: AC
  Filled 2018-11-19: qty 1

## 2018-11-19 MED ORDER — DIPHENHYDRAMINE HCL 25 MG PO CAPS
ORAL_CAPSULE | ORAL | Status: AC
  Filled 2018-11-19: qty 2

## 2018-11-19 MED ORDER — PROCHLORPERAZINE MALEATE 10 MG PO TABS
10.0000 mg | ORAL_TABLET | Freq: Once | ORAL | Status: AC
  Administered 2018-11-19: 10 mg via ORAL

## 2018-11-19 MED ORDER — ACETAMINOPHEN 325 MG PO TABS
650.0000 mg | ORAL_TABLET | Freq: Once | ORAL | Status: AC
  Administered 2018-11-19: 650 mg via ORAL

## 2018-11-19 MED ORDER — DIPHENHYDRAMINE HCL 25 MG PO CAPS
50.0000 mg | ORAL_CAPSULE | Freq: Once | ORAL | Status: AC
  Administered 2018-11-19: 50 mg via ORAL

## 2018-11-19 MED ORDER — SODIUM CHLORIDE 0.9 % IV SOLN
Freq: Once | INTRAVENOUS | Status: AC
  Administered 2018-11-19: 11:00:00 via INTRAVENOUS
  Filled 2018-11-19: qty 250

## 2018-11-19 MED ORDER — SODIUM CHLORIDE 0.9 % IV SOLN
10.0000 mg/kg | Freq: Once | INTRAVENOUS | Status: AC
  Administered 2018-11-19: 700 mg via INTRAVENOUS
  Filled 2018-11-19: qty 12

## 2018-11-19 MED ORDER — ACETAMINOPHEN 325 MG PO TABS
ORAL_TABLET | ORAL | Status: AC
  Filled 2018-11-19: qty 2

## 2018-11-19 NOTE — Patient Instructions (Signed)
Elotuzumab injection What is this medicine? ELOTUZUMAB (el oh tooz ue mab) is a monoclonal antibody. It is used to treat multiple myeloma. This medicine may be used for other purposes; ask your health care provider or pharmacist if you have questions. COMMON BRAND NAME(S): Empliciti What should I tell my health care provider before I take this medicine? They need to know if you have any of these conditions: -hepatic disease -infection -an unusual or allergic reaction to elotuzumab, other medicines, foods, dyes, or preservatives -pregnant or trying to get pregnant -breast-feeding How should I use this medicine? This medicine is for infusion into a vein. It is given by a health care professional in a hospital or clinic setting. Talk to your pediatrician regarding the use of this medicine in children. Special care may be needed. Overdosage: If you think you have taken too much of this medicine contact a poison control center or emergency room at once. NOTE: This medicine is only for you. Do not share this medicine with others. What if I miss a dose? Keep appointments for follow-up doses as directed. It is important not to miss your dose. Call your doctor or health care professional if you are unable to keep an appointment. What may interact with this medicine? Interactions have not been studied. Give your health care provider a list of all the medicines, herbs, non-prescription drugs, or dietary supplements you use. Also tell them if you smoke, drink alcohol, or use illegal drugs. Some items may interact with your medicine. This list may not describe all possible interactions. Give your health care provider a list of all the medicines, herbs, non-prescription drugs, or dietary supplements you use. Also tell them if you smoke, drink alcohol, or use illegal drugs. Some items may interact with your medicine. What should I watch for while using this medicine? This drug may make you feel generally  unwell. Report any side effects. Continue your course of treatment even though you feel ill unless your doctor tells you to stop. This medicine can cause serious allergic reactions. To reduce your risk you may need to take medicine before treatment with this medicine. Take your medicine as directed. You may need blood work done while you are taking this medicine. This medicine can affect the results of some tests used to determine treatment response; extra tests may be needed to evaluate response. Talk to your doctor about your risk of cancer. You may be more at risk for certain types of cancers if you take this medicine. Women should inform their doctor if they wish to become pregnant or think they might be pregnant. There is a potential for serious side effects to an unborn child. Talk to your health care professional or pharmacist for more information. Do not breast-feed an infant while taking this medicine. What side effects may I notice from receiving this medicine? Side effects that you should report to your doctor or health care professional as soon as possible: -allergic reactions like skin rash, itching or hives, swelling of the face, lips, or tongue -breathing problems -chest pain -dizziness -lightheaded -signs and symptoms of high blood sugar such as dry mouth, dry skin, fruity breath, stomach pain, increased hunger, increased thirst, increased urination -signs and symptoms of infection like fever or chills; cough; sore throat; pain or trouble passing urine -signs and symptoms of liver injury like dark yellow or brown urine; general ill feeling or flu-like symptoms; light-colored stools; loss of appetite; nausea; right upper belly pain; unusually weak or tired; yellowing   of the eyes or skin Side effects that usually do not require medical attention (report to your doctor or health care professional if they continue or are bothersome): -constipation -decreased appetite -diarrhea -pain,  tingling, numbness in the hands or feet -tiredness This list may not describe all possible side effects. Call your doctor for medical advice about side effects. You may report side effects to FDA at 1-800-FDA-1088. Where should I keep my medicine? Keep out of the reach of children. This drug is given in a hospital or clinic and will not be stored at home. NOTE: This sheet is a summary. It may not cover all possible information. If you have questions about this medicine, talk to your doctor, pharmacist, or health care provider.  2019 Elsevier/Gold Standard (2017-06-20 10:93:23)

## 2018-11-20 ENCOUNTER — Other Ambulatory Visit: Payer: Self-pay | Admitting: Hematology & Oncology

## 2018-11-20 DIAGNOSIS — I10 Essential (primary) hypertension: Secondary | ICD-10-CM

## 2018-11-25 ENCOUNTER — Telehealth: Payer: Self-pay | Admitting: Family

## 2018-11-25 ENCOUNTER — Other Ambulatory Visit: Payer: Self-pay | Admitting: *Deleted

## 2018-11-25 DIAGNOSIS — C9002 Multiple myeloma in relapse: Secondary | ICD-10-CM

## 2018-11-25 DIAGNOSIS — C9000 Multiple myeloma not having achieved remission: Secondary | ICD-10-CM

## 2018-11-25 NOTE — Telephone Encounter (Signed)
COVID-19 Pre-Screening Questions: ° °Do you currently have a fever (>100 °F), chills or unexplained body aches? No  °Are you currently experiencing new cough, shortness of breath, sore throat, runny nose?no  °•  °Have you recently travelled outside the state of [place] in the last 14 days?no  °•  °Have you been in contact with someone that is currently pending confirmation of Covid19 testing or has been confirmed to have the Covid19 virus?  No  °

## 2018-11-26 ENCOUNTER — Encounter: Payer: Self-pay | Admitting: Family

## 2018-11-26 ENCOUNTER — Ambulatory Visit: Payer: Medicare HMO | Admitting: Family

## 2018-11-26 ENCOUNTER — Inpatient Hospital Stay (HOSPITAL_BASED_OUTPATIENT_CLINIC_OR_DEPARTMENT_OTHER): Payer: Medicare HMO | Admitting: Hematology & Oncology

## 2018-11-26 ENCOUNTER — Inpatient Hospital Stay: Payer: Medicare HMO

## 2018-11-26 ENCOUNTER — Encounter: Payer: Self-pay | Admitting: Hematology & Oncology

## 2018-11-26 ENCOUNTER — Other Ambulatory Visit: Payer: Self-pay

## 2018-11-26 VITALS — BP 153/72 | HR 72 | Temp 97.5°F | Wt 142.0 lb

## 2018-11-26 VITALS — BP 145/71 | HR 77 | Temp 97.6°F | Resp 19 | Wt 140.0 lb

## 2018-11-26 DIAGNOSIS — C9000 Multiple myeloma not having achieved remission: Secondary | ICD-10-CM

## 2018-11-26 DIAGNOSIS — Z5112 Encounter for antineoplastic immunotherapy: Secondary | ICD-10-CM | POA: Diagnosis not present

## 2018-11-26 DIAGNOSIS — M272 Inflammatory conditions of jaws: Secondary | ICD-10-CM | POA: Diagnosis not present

## 2018-11-26 DIAGNOSIS — Z923 Personal history of irradiation: Secondary | ICD-10-CM | POA: Diagnosis not present

## 2018-11-26 DIAGNOSIS — Z79899 Other long term (current) drug therapy: Secondary | ICD-10-CM | POA: Diagnosis not present

## 2018-11-26 DIAGNOSIS — N289 Disorder of kidney and ureter, unspecified: Secondary | ICD-10-CM | POA: Diagnosis not present

## 2018-11-26 DIAGNOSIS — C9002 Multiple myeloma in relapse: Secondary | ICD-10-CM

## 2018-11-26 DIAGNOSIS — D649 Anemia, unspecified: Secondary | ICD-10-CM

## 2018-11-26 DIAGNOSIS — C9001 Multiple myeloma in remission: Secondary | ICD-10-CM

## 2018-11-26 LAB — CMP (CANCER CENTER ONLY)
ALT: 13 U/L (ref 0–44)
AST: 17 U/L (ref 15–41)
Albumin: 3.6 g/dL (ref 3.5–5.0)
Alkaline Phosphatase: 80 U/L (ref 38–126)
Anion gap: 3 — ABNORMAL LOW (ref 5–15)
BUN: 38 mg/dL — ABNORMAL HIGH (ref 8–23)
CO2: 24 mmol/L (ref 22–32)
Calcium: 9.2 mg/dL (ref 8.9–10.3)
Chloride: 105 mmol/L (ref 98–111)
Creatinine: 1.44 mg/dL — ABNORMAL HIGH (ref 0.61–1.24)
GFR, Est AFR Am: 51 mL/min — ABNORMAL LOW (ref >60)
GFR, Estimated: 44 mL/min — ABNORMAL LOW (ref >60)
Glucose, Bld: 82 mg/dL (ref 70–99)
Potassium: 4.5 mmol/L (ref 3.5–5.1)
Sodium: 132 mmol/L — ABNORMAL LOW (ref 135–145)
Total Bilirubin: 0.5 mg/dL (ref 0.3–1.2)
Total Protein: 7.6 g/dL (ref 6.5–8.1)

## 2018-11-26 LAB — CBC WITH DIFFERENTIAL (CANCER CENTER ONLY)
Abs Immature Granulocytes: 0.01 10*3/uL (ref 0.00–0.07)
Basophils Absolute: 0.1 10*3/uL (ref 0.0–0.1)
Basophils Relative: 2 %
Eosinophils Absolute: 0.6 10*3/uL — ABNORMAL HIGH (ref 0.0–0.5)
Eosinophils Relative: 12 %
HCT: 30.5 % — ABNORMAL LOW (ref 39.0–52.0)
Hemoglobin: 9.9 g/dL — ABNORMAL LOW (ref 13.0–17.0)
Immature Granulocytes: 0 %
Lymphocytes Relative: 7 %
Lymphs Abs: 0.4 10*3/uL — ABNORMAL LOW (ref 0.7–4.0)
MCH: 29.6 pg (ref 26.0–34.0)
MCHC: 32.5 g/dL (ref 30.0–36.0)
MCV: 91 fL (ref 80.0–100.0)
Monocytes Absolute: 1 10*3/uL (ref 0.1–1.0)
Monocytes Relative: 19 %
Neutro Abs: 3.2 10*3/uL (ref 1.7–7.7)
Neutrophils Relative %: 60 %
Platelet Count: 212 10*3/uL (ref 150–400)
RBC: 3.35 MIL/uL — ABNORMAL LOW (ref 4.22–5.81)
RDW: 14.5 % (ref 11.5–15.5)
WBC Count: 5.3 10*3/uL (ref 4.0–10.5)
nRBC: 0 % (ref 0.0–0.2)

## 2018-11-26 MED ORDER — PROCHLORPERAZINE MALEATE 10 MG PO TABS
ORAL_TABLET | ORAL | Status: AC
  Filled 2018-11-26: qty 1

## 2018-11-26 MED ORDER — FAMOTIDINE IN NACL 20-0.9 MG/50ML-% IV SOLN
20.0000 mg | Freq: Once | INTRAVENOUS | Status: AC
  Administered 2018-11-26: 20 mg via INTRAVENOUS

## 2018-11-26 MED ORDER — ACETAMINOPHEN 325 MG PO TABS
ORAL_TABLET | ORAL | Status: AC
  Filled 2018-11-26: qty 2

## 2018-11-26 MED ORDER — DEXAMETHASONE SODIUM PHOSPHATE 10 MG/ML IJ SOLN
INTRAMUSCULAR | Status: AC
  Filled 2018-11-26: qty 1

## 2018-11-26 MED ORDER — FAMOTIDINE IN NACL 20-0.9 MG/50ML-% IV SOLN
INTRAVENOUS | Status: AC
  Filled 2018-11-26: qty 50

## 2018-11-26 MED ORDER — FAMOTIDINE IN NACL 20-0.9 MG/50ML-% IV SOLN: 20.0000 mg | Freq: Once | INTRAVENOUS | Status: DC

## 2018-11-26 MED ORDER — ACETAMINOPHEN 325 MG PO TABS
650.0000 mg | ORAL_TABLET | Freq: Once | ORAL | Status: AC
  Administered 2018-11-26: 650 mg via ORAL

## 2018-11-26 MED ORDER — SODIUM CHLORIDE 0.9 % IV SOLN
Freq: Once | INTRAVENOUS | Status: AC
  Administered 2018-11-26: 14:00:00 via INTRAVENOUS
  Filled 2018-11-26: qty 250

## 2018-11-26 MED ORDER — SODIUM CHLORIDE 0.9 % IV SOLN
20.0000 mg | Freq: Once | INTRAVENOUS | Status: DC
  Filled 2018-11-26: qty 2

## 2018-11-26 MED ORDER — PROCHLORPERAZINE MALEATE 10 MG PO TABS
10.0000 mg | ORAL_TABLET | Freq: Once | ORAL | Status: AC
  Administered 2018-11-26: 10 mg via ORAL

## 2018-11-26 MED ORDER — DIPHENHYDRAMINE HCL 25 MG PO CAPS
ORAL_CAPSULE | ORAL | Status: AC
  Filled 2018-11-26: qty 2

## 2018-11-26 MED ORDER — SODIUM CHLORIDE 0.9 % IV SOLN
10.0000 mg/kg | Freq: Once | INTRAVENOUS | Status: AC
  Administered 2018-11-26: 700 mg via INTRAVENOUS
  Filled 2018-11-26: qty 16

## 2018-11-26 MED ORDER — DEXAMETHASONE SODIUM PHOSPHATE 10 MG/ML IJ SOLN
8.0000 mg | Freq: Once | INTRAMUSCULAR | Status: AC
  Administered 2018-11-26: 14:00:00 8 mg via INTRAVENOUS

## 2018-11-26 MED ORDER — DIPHENHYDRAMINE HCL 25 MG PO CAPS
50.0000 mg | ORAL_CAPSULE | Freq: Once | ORAL | Status: AC
  Administered 2018-11-26: 14:00:00 50 mg via ORAL

## 2018-11-26 MED ORDER — PENICILLIN V POTASSIUM 500 MG PO TABS: 500.0000 mg | ORAL_TABLET | Freq: Four times a day (QID) | ORAL | 2 refills | Status: DC

## 2018-11-26 NOTE — Progress Notes (Signed)
Hematology and Oncology Follow Up Visit  Christian Burns [id number] [birth year] 85 y.o. 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 is here today for follow-up.  He is doing okay.  He is getting around better.  He had the right hip surgery for the pathologic fracture.  This back in January.  He then had radiation therapy.  He completed 10 fractions of treatment.  He is walking pretty well.  He does have a rolling walker.  While he is at home, he does not use the walker.  He has had no issues with nausea or vomiting.  Pain seems to be doing pretty well.  I think it is still little early to see how he is doing with respect to the myeloma.  Hopefully, we will find out how he is doing after this cycle of treatment.  He is eating okay.  He has some dental work done.  He apparently had a tooth in the right mandibular side and needed to be ground down.  He has had no problems with bowels or bladder.  Overall, his performance status is ECOG 2.   Medications:  Allergies as of 11/26/2018      Reactions   Sulfa Antibiotics Itching   Sulfasalazine Itching      Medication List       Accurate as of November 26, 2018  1:19 PM. Always  use your most recent med list.        amLODipine-benazepril 5-10 MG capsule Commonly known as:  LOTREL TAKE 1 CAPSULE BY MOUTH DAILY   finasteride 5 MG tablet Commonly known as:  PROSCAR Take 5 mg by mouth daily.   montelukast 10 MG tablet Commonly known as:  Singulair Take 1 tablet (10 mg total) by mouth at bedtime.   multivitamin with minerals Tabs tablet Take 1 tablet by mouth daily.   penicillin v potassium 500 MG tablet Commonly known as:  VEETID Take 1 tablet (500 mg total) by mouth 4 (four) times daily.   pomalidomide 2 MG capsule Commonly known as:  POMALYST Take 1 capsule (2 mg total) by mouth daily. Take with water on days 1-21. Repeat every 28 days. STMH#[id number]   PROBIOTIC PO Take 1 capsule by mouth daily.   pyridOXINE 100 MG tablet Commonly known as:  VITAMIN B-6 Take 100 mg by mouth daily.   traMADol 50 MG tablet Commonly known as:  ULTRAM Take 50 mg by mouth every 8 (eight) hours as needed for pain.   vitamin B-12 500 MCG tablet Commonly known as:  CYANOCOBALAMIN Take 500 mcg by mouth daily.   Vitamin D (Ergocalciferol) 1.25 MG (50000 UT) Caps capsule Commonly known as:  DRISDOL Take 50,000 Units by mouth every Sunday.  Allergies:  Allergies  Allergen Reactions   Sulfa Antibiotics Itching   Sulfasalazine Itching    Past Medical History, Surgical history, Social history, and Family History were reviewed and updated.  Review of Systems: Review of Systems  Constitutional: Positive for weight loss.  HENT: Negative.   Eyes: Negative.   Respiratory: Negative.   Cardiovascular: Negative.   Gastrointestinal: Negative.   Genitourinary: Positive for dysuria.  Musculoskeletal: Positive for joint pain and myalgias.  Skin: Negative.   Neurological: Negative.   Endo/Heme/Allergies: Negative.   Psychiatric/Behavioral: Negative.      Physical Exam:  weight is 140 lb (63.5 kg). His oral temperature is 97.6 F (36.4 C). His blood  pressure is 145/71 (abnormal) and his pulse is 77. His respiration is 19 and oxygen saturation is 100%.   Wt Readings from Last 3 Encounters:  11/26/18 140 lb (63.5 kg)  11/26/18 142 lb (64.4 kg)  10/29/18 141 lb (64 kg)    Physical Exam Vitals signs reviewed.  HENT:     Head: Normocephalic and atraumatic.  Eyes:     Pupils: Pupils are equal, round, and reactive to light.  Neck:     Musculoskeletal: Normal range of motion.  Cardiovascular:     Rate and Rhythm: Normal rate and regular rhythm.     Heart sounds: Normal heart sounds.  Pulmonary:     Effort: Pulmonary effort is normal.     Breath sounds: Normal breath sounds.  Abdominal:     General: Bowel sounds are normal.     Palpations: Abdomen is soft.  Musculoskeletal: Normal range of motion.        General: No tenderness or deformity.  Lymphadenopathy:     Cervical: No cervical adenopathy.  Skin:    General: Skin is warm and dry.     Findings: No erythema or rash.  Neurological:     Mental Status: He is alert and oriented to person, place, and time.  Psychiatric:        Behavior: Behavior normal.        Thought Content: Thought content normal.        Judgment: Judgment normal.      Lab Results  Component Value Date   WBC 5.3 11/26/2018   HGB 9.9 (L) 11/26/2018   HCT 30.5 (L) 11/26/2018   MCV 91.0 11/26/2018   PLT 212 11/26/2018   Lab Results  Component Value Date   FERRITIN 147 11/05/2018   IRON 77 11/05/2018   TIBC 208 11/05/2018   UIBC 131 11/05/2018   IRONPCTSAT 37 11/05/2018   Lab Results  Component Value Date   RBC 3.35 (L) 11/26/2018   Lab Results  Component Value Date   KPAFRELGTCHN 22.2 (H) 10/29/2018   LAMBDASER 776.9 (H) 10/29/2018   KAPLAMBRATIO 0.03 (L) 10/29/2018   Lab Results  Component Value Date   IGGSERUM 2,603 (H) 10/29/2018   IGGSERUM 2,534 (H) 10/29/2018   IGA 82 10/29/2018   IGA 83 10/29/2018   IGMSERUM 38 10/29/2018   IGMSERUM 35 10/29/2018   Lab Results  Component  Value Date   TOTALPROTELP 6.9 10/29/2018   ALBUMINELP 3.1 10/29/2018   A1GS 0.3 10/29/2018   A2GS 0.8 10/29/2018   BETS 0.7 10/29/2018   BETA2SER 0.3 07/28/2015   GAMS 2.0 (H) 10/29/2018   MSPIKE 1.7 (H) 10/29/2018   SPEI Comment 08/13/2018     Chemistry      Component Value Date/Time   NA 132 (L) 11/26/2018 1210   NA 146 (H)  08/07/2017 1153   NA 141 01/09/2017 1004   K 4.5 11/26/2018 1210   K 4.6 08/07/2017 1153   K 4.4 01/09/2017 1004   CL 105 11/26/2018 1210   CL 108 08/07/2017 1153   CO2 24 11/26/2018 1210   CO2 26 08/07/2017 1153   CO2 22 01/09/2017 1004   BUN 38 (H) 11/26/2018 1210   BUN 24 (H) 08/07/2017 1153   BUN 23.9 01/09/2017 1004   CREATININE 1.44 (H) 11/26/2018 1210   CREATININE 1.56 (H) 05/26/2018 1508   CREATININE 1.5 (H) 01/09/2017 1004      Component Value Date/Time   CALCIUM 9.2 11/26/2018 1210   CALCIUM 8.8 08/07/2017 1153   CALCIUM 8.8 01/09/2017 1004   ALKPHOS 80 11/26/2018 1210   ALKPHOS 97 (H) 08/07/2017 1153   ALKPHOS 80 01/09/2017 1004   AST 17 11/26/2018 1210   AST 18 01/09/2017 1004   ALT 13 11/26/2018 1210   ALT 20 08/07/2017 1153   ALT 12 01/09/2017 1004   BILITOT 0.5 11/26/2018 1210   BILITOT 0.92 01/09/2017 1004       Impression and Plan: Mr. Magro is a very pleasant 85 yo caucasian gentleman with relapsed IgG lambda myeloma.   His focus of therapy has always been taking care of his wife.  He wants to be able to help her.  She is crippled by arthritis.  Hopefully, the elotuzimab/pomalidomide combination will be working.  It is still a little early to see how things are going.  I think we will know how things are going and responding after his second cycle.  His blood counts are doing fairly well right now.  He is getting around better which is nice to see.  I will see him back in a month.  At that time, we will then proceed with every 2-week therapy intervals.   [name], MD 4/16/20201:19 PM

## 2018-11-26 NOTE — Progress Notes (Signed)
Subjective:    Patient ID: Christian Burns, male    DOB: 1930-01-25, 85 y.o.   MRN: [id number]  Chief Complaint  Patient presents with  . Follow-up    Jaw     HPI:  Christian Burns is a 85 y.o. 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 Christian Burns was last seen on 07/29/18 with CT scan showing no worsening and was continued on oral Penicillin.   Christian Burns has been taking his penicillin as prescribed with no adverse side effects or diarrhea. When seen by dentistry 2 days ago for acute onset dental pain located in the right lower area with difficulties biting and resulting in him having decreased oral intake. The dentist was able to solve the problem and now he is back to eating normally without pain. He has had no systemic symptoms. Continues to follow with endodontist.   Allergies  Allergen Reactions  . Sulfa Antibiotics Itching  . Sulfasalazine Itching      Outpatient Medications Prior to Visit  Medication Sig Dispense Refill  . amLODipine-benazepril (LOTREL) 5-10 MG capsule TAKE 1 CAPSULE BY MOUTH DAILY 30 capsule 0  . finasteride (PROSCAR) 5 MG tablet Take 5 mg by mouth daily.    . montelukast (SINGULAIR) 10 MG tablet Take 1 tablet (10 mg total) by mouth at bedtime. 30 tablet 2  . Multiple Vitamin (MULTIVITAMIN WITH MINERALS) TABS tablet Take 1 tablet by mouth daily.    . pomalidomide (POMALYST) 2 MG capsule Take 1 capsule (2 mg total) by mouth daily. Take with water on days 1-21. Repeat every 28 days. QMGQ#[id number] 21 capsule 0  . Probiotic Product (PROBIOTIC PO) Take 1 capsule by mouth daily.    Marland Kitchen pyridOXINE (VITAMIN B-6) 100 MG tablet Take 100 mg by mouth daily.     . traMADol (ULTRAM) 50 MG tablet Take 50 mg by mouth every 8 (eight) hours as needed for pain.    . vitamin B-12 (CYANOCOBALAMIN) 500 MCG tablet Take 500 mcg by mouth daily.    . Vitamin D, Ergocalciferol, (DRISDOL) 50000 UNITS CAPS capsule Take 50,000 Units  by mouth every Sunday.     . penicillin v potassium (VEETID) 500 MG tablet Take 1 tablet (500 mg total) by mouth QID. 120 tablet 5   No facility-administered medications prior to visit.      Past Medical History:  Diagnosis Date  . CKD (chronic kidney disease) 07/06/2018   CKD stage III  . History of radiation therapy 06/17/13-07/06/13   35 gray to upper lumbar spine  . 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 Hypertension   . Inguinal hernia   . Multiple myeloma [facility]) Sept 2010  . Multiple myeloma in relapse ([place]) 04/05/2016  . Radiation 09/26/15-10/20/15   lower thoracic spine, upper lumbar spine 30 gray     Past Surgical History:  Procedure Laterality Date  . COLONOSCOPY    . HIP PINNING,CANNULATED Right 09/01/2018   Procedure: [facility] NAIL HIP PINNING;  Surgeon: Melrose Nakayama, MD;  Location: [place];  Service: Orthopedics;  Laterality: Right;  . TEE WITHOUT CARDIOVERSION N/A 10/09/2018   Procedure: TRANSESOPHAGEAL ECHOCARDIOGRAM (TEE);  Surgeon: Acie Fredrickson Wonda Cheng, MD;  Location: [facility] ENDOSCOPY;  Service: Cardiovascular;  Laterality: N/A;  . TONSILLECTOMY         Review of Systems  Constitutional: Negative for chills, diaphoresis, fatigue, fever and unexpected weight change.  Respiratory: Negative for cough, chest tightness, shortness of breath  and wheezing.   Cardiovascular: Negative for chest pain.  Gastrointestinal: Negative for abdominal pain, constipation, diarrhea, nausea and vomiting.      Objective:    BP (!) 153/72   Pulse 72   Temp (!) 97.5 F (36.4 C) (Oral)   Wt 142 lb (64.4 kg)   BMI 20.37 kg/m  Nursing note and vital signs reviewed.  Physical Exam Constitutional:      General: He is not in acute distress.    Appearance: He is well-developed.     Comments: Elderly gentleman seated in the chair; pleasant.   HENT:     Mouth/Throat:     Comments: Jaw with no obvious deformity, discoloration or pain.  Cardiovascular:     Rate and Rhythm: Normal  rate and regular rhythm.     Heart sounds: Normal heart sounds.  Pulmonary:     Effort: Pulmonary effort is normal.     Breath sounds: Normal breath sounds.  Skin:    General: Skin is warm and dry.  Neurological:     Mental Status: He is alert.  Psychiatric:        Mood and Affect: Mood normal.        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 He has been continued on Penicillin since his initial office visit in September 2019 completing approximately 6 months worth of therapy. His acute dental problem appears unrelated. I am not sure that he needs any additional treatment at this time as I would expect the site to be sterile and healing to occur. Will check inflammatory markers today and will make further recommendations pending results. Will plan for tentative follow up with Dr. Baxter Flattery in 2 months.       Relevant Orders   C-reactive protein   Sedimentation rate       I have changed Christian Burns's penicillin v potassium. I am also having him maintain his Vitamin D (Ergocalciferol), pyridOXINE, finasteride, multivitamin with minerals, vitamin B-12, Probiotic Product (PROBIOTIC PO), traMADol, montelukast, pomalidomide, and amLODipine-benazepril.   Meds ordered this encounter  Medications  . penicillin v potassium (VEETID) 500 MG tablet    Sig: Take 1 tablet (500 mg total) by mouth 4 (four) times daily.    Dispense:  120 tablet    Refill:  2    Order Specific Question:   Supervising Provider    Answer:   Carlyle Basques [[id number]]    A total of 30 minutes was spent face-to-face with the patient with over half of the time spent in counseling and coordination of care. We discussed the goals of care, potential for stopping therapy, discussion with endodontist and side effects  of antibiotics.     Follow-up: Return in about 2 months (around 01/26/2019), or if symptoms worsen or fail to improve.   Terri Piedra, MSN, FNP-C Nurse Practitioner [facility] for Infectious Disease [place] number: [phone]

## 2018-11-26 NOTE — Patient Instructions (Signed)
Nice to see you.  We will continue your penicillin for the next couple of months.   Follow up with endodonist as scheduled.  Please have your lab work completed and see if they can draw your inflammatory markers which have been ordered.  Follow up in 2 months or sooner if needed with Dr. Baxter Flattery or other MD.

## 2018-11-26 NOTE — Patient Instructions (Signed)
Elotuzumab injection What is this medicine? ELOTUZUMAB (el oh tooz ue mab) is a monoclonal antibody. It is used to treat multiple myeloma. This medicine may be used for other purposes; ask your health care provider or pharmacist if you have questions. COMMON BRAND NAME(S): Empliciti What should I tell my health care provider before I take this medicine? They need to know if you have any of these conditions: -hepatic disease -infection -an unusual or allergic reaction to elotuzumab, other medicines, foods, dyes, or preservatives -pregnant or trying to get pregnant -breast-feeding How should I use this medicine? This medicine is for infusion into a vein. It is given by a health care professional in a hospital or clinic setting. Talk to your pediatrician regarding the use of this medicine in children. Special care may be needed. Overdosage: If you think you have taken too much of this medicine contact a poison control center or emergency room at once. NOTE: This medicine is only for you. Do not share this medicine with others. What if I miss a dose? Keep appointments for follow-up doses as directed. It is important not to miss your dose. Call your doctor or health care professional if you are unable to keep an appointment. What may interact with this medicine? Interactions have not been studied. Give your health care provider a list of all the medicines, herbs, non-prescription drugs, or dietary supplements you use. Also tell them if you smoke, drink alcohol, or use illegal drugs. Some items may interact with your medicine. This list may not describe all possible interactions. Give your health care provider a list of all the medicines, herbs, non-prescription drugs, or dietary supplements you use. Also tell them if you smoke, drink alcohol, or use illegal drugs. Some items may interact with your medicine. What should I watch for while using this medicine? This drug may make you feel generally  unwell. Report any side effects. Continue your course of treatment even though you feel ill unless your doctor tells you to stop. This medicine can cause serious allergic reactions. To reduce your risk you may need to take medicine before treatment with this medicine. Take your medicine as directed. You may need blood work done while you are taking this medicine. This medicine can affect the results of some tests used to determine treatment response; extra tests may be needed to evaluate response. Talk to your doctor about your risk of cancer. You may be more at risk for certain types of cancers if you take this medicine. Women should inform their doctor if they wish to become pregnant or think they might be pregnant. There is a potential for serious side effects to an unborn child. Talk to your health care professional or pharmacist for more information. Do not breast-feed an infant while taking this medicine. What side effects may I notice from receiving this medicine? Side effects that you should report to your doctor or health care professional as soon as possible: -allergic reactions like skin rash, itching or hives, swelling of the face, lips, or tongue -breathing problems -chest pain -dizziness -lightheaded -signs and symptoms of high blood sugar such as dry mouth, dry skin, fruity breath, stomach pain, increased hunger, increased thirst, increased urination -signs and symptoms of infection like fever or chills; cough; sore throat; pain or trouble passing urine -signs and symptoms of liver injury like dark yellow or brown urine; general ill feeling or flu-like symptoms; light-colored stools; loss of appetite; nausea; right upper belly pain; unusually weak or tired; yellowing   of the eyes or skin Side effects that usually do not require medical attention (report to your doctor or health care professional if they continue or are bothersome): -constipation -decreased appetite -diarrhea -pain,  tingling, numbness in the hands or feet -tiredness This list may not describe all possible side effects. Call your doctor for medical advice about side effects. You may report side effects to FDA at 1-800-FDA-1088. Where should I keep my medicine? Keep out of the reach of children. This drug is given in a hospital or clinic and will not be stored at home. NOTE: This sheet is a summary. It may not cover all possible information. If you have questions about this medicine, talk to your doctor, pharmacist, or health care provider.  2019 Elsevier/Gold Standard (2017-06-20 10:93:23)

## 2018-11-26 NOTE — Assessment & Plan Note (Addendum)
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 He has been continued on Penicillin since his initial office visit in September 2019 completing approximately 6 months worth of therapy. His acute dental problem appears unrelated. I am not sure that he needs any additional treatment at this time as I would expect the site to be sterile and healing to occur. Will check inflammatory markers today and will make further recommendations pending results. Will plan for tentative follow up with Dr. Baxter Flattery in 2 months.

## 2018-11-27 LAB — PROTEIN ELECTROPHORESIS, SERUM
A/G Ratio: 0.8 (ref 0.7–1.7)
Albumin ELP: 3.2 g/dL (ref 2.9–4.4)
Alpha-1-Globulin: 0.3 g/dL (ref 0.0–0.4)
Alpha-2-Globulin: 1 g/dL (ref 0.4–1.0)
Beta Globulin: 0.8 g/dL (ref 0.7–1.3)
Gamma Globulin: 2 g/dL — ABNORMAL HIGH (ref 0.4–1.8)
Globulin, Total: 4.1 g/dL — ABNORMAL HIGH (ref 2.2–3.9)
M-Spike, %: 1.7 g/dL — ABNORMAL HIGH
Total Protein ELP: 7.3 g/dL (ref 6.0–8.5)

## 2018-11-27 LAB — IGG, IGA, IGM
IgA: 95 mg/dL (ref 61–437)
IgG (Immunoglobin G), Serum: 2596 mg/dL — ABNORMAL HIGH (ref 603–1613)
IgM (Immunoglobulin M), Srm: 35 mg/dL (ref 15–143)

## 2018-11-27 LAB — KAPPA/LAMBDA LIGHT CHAINS
Kappa free light chain: 45.4 mg/L — ABNORMAL HIGH (ref 3.3–19.4)
Kappa, lambda light chain ratio: 0.06 — ABNORMAL LOW (ref 0.26–1.65)
Lambda free light chains: 745.1 mg/L — ABNORMAL HIGH (ref 5.7–26.3)

## 2018-12-02 ENCOUNTER — Other Ambulatory Visit: Payer: Self-pay | Admitting: *Deleted

## 2018-12-02 DIAGNOSIS — C9002 Multiple myeloma in relapse: Secondary | ICD-10-CM

## 2018-12-03 ENCOUNTER — Inpatient Hospital Stay: Payer: Medicare HMO

## 2018-12-03 ENCOUNTER — Other Ambulatory Visit: Payer: Self-pay

## 2018-12-03 DIAGNOSIS — C9000 Multiple myeloma not having achieved remission: Secondary | ICD-10-CM

## 2018-12-03 DIAGNOSIS — C9002 Multiple myeloma in relapse: Secondary | ICD-10-CM

## 2018-12-03 DIAGNOSIS — Z923 Personal history of irradiation: Secondary | ICD-10-CM | POA: Diagnosis not present

## 2018-12-03 DIAGNOSIS — D649 Anemia, unspecified: Secondary | ICD-10-CM | POA: Diagnosis not present

## 2018-12-03 DIAGNOSIS — N289 Disorder of kidney and ureter, unspecified: Secondary | ICD-10-CM | POA: Diagnosis not present

## 2018-12-03 DIAGNOSIS — Z79899 Other long term (current) drug therapy: Secondary | ICD-10-CM | POA: Diagnosis not present

## 2018-12-03 DIAGNOSIS — Z5112 Encounter for antineoplastic immunotherapy: Secondary | ICD-10-CM | POA: Diagnosis not present

## 2018-12-03 LAB — CBC WITH DIFFERENTIAL (CANCER CENTER ONLY)
Abs Immature Granulocytes: 0.02 10*3/uL (ref 0.00–0.07)
Basophils Absolute: 0.2 10*3/uL — ABNORMAL HIGH (ref 0.0–0.1)
Basophils Relative: 5 %
Eosinophils Absolute: 0.2 10*3/uL (ref 0.0–0.5)
Eosinophils Relative: 5 %
HCT: 29.7 % — ABNORMAL LOW (ref 39.0–52.0)
Hemoglobin: 9.6 g/dL — ABNORMAL LOW (ref 13.0–17.0)
Immature Granulocytes: 1 %
Lymphocytes Relative: 9 %
Lymphs Abs: 0.4 10*3/uL — ABNORMAL LOW (ref 0.7–4.0)
MCH: 29.1 pg (ref 26.0–34.0)
MCHC: 32.3 g/dL (ref 30.0–36.0)
MCV: 90 fL (ref 80.0–100.0)
Monocytes Absolute: 1 10*3/uL (ref 0.1–1.0)
Monocytes Relative: 22 %
Neutro Abs: 2.6 10*3/uL (ref 1.7–7.7)
Neutrophils Relative %: 58 %
Platelet Count: 249 10*3/uL (ref 150–400)
RBC: 3.3 MIL/uL — ABNORMAL LOW (ref 4.22–5.81)
RDW: 14.5 % (ref 11.5–15.5)
WBC Count: 4.4 10*3/uL (ref 4.0–10.5)
nRBC: 0 % (ref 0.0–0.2)

## 2018-12-03 LAB — CMP (CANCER CENTER ONLY)
ALT: 11 U/L (ref 0–44)
AST: 17 U/L (ref 15–41)
Albumin: 3.5 g/dL (ref 3.5–5.0)
Alkaline Phosphatase: 79 U/L (ref 38–126)
Anion gap: 7 (ref 5–15)
BUN: 27 mg/dL — ABNORMAL HIGH (ref 8–23)
CO2: 22 mmol/L (ref 22–32)
Calcium: 9.3 mg/dL (ref 8.9–10.3)
Chloride: 109 mmol/L (ref 98–111)
Creatinine: 1.4 mg/dL — ABNORMAL HIGH (ref 0.61–1.24)
GFR, Est AFR Am: 52 mL/min — ABNORMAL LOW (ref >60)
GFR, Estimated: 45 mL/min — ABNORMAL LOW (ref >60)
Glucose, Bld: 89 mg/dL (ref 70–99)
Potassium: 4.3 mmol/L (ref 3.5–5.1)
Sodium: 138 mmol/L (ref 135–145)
Total Bilirubin: 0.5 mg/dL (ref 0.3–1.2)
Total Protein: 7.3 g/dL (ref 6.5–8.1)

## 2018-12-03 MED ORDER — DEXAMETHASONE SODIUM PHOSPHATE 10 MG/ML IJ SOLN
8.0000 mg | Freq: Once | INTRAMUSCULAR | Status: AC
  Administered 2018-12-03: 8 mg via INTRAVENOUS

## 2018-12-03 MED ORDER — SODIUM CHLORIDE 0.9 % IV SOLN: 20.0000 mg | Freq: Once | INTRAVENOUS | Status: DC

## 2018-12-03 MED ORDER — ACETAMINOPHEN 325 MG PO TABS
650.0000 mg | ORAL_TABLET | Freq: Once | ORAL | Status: AC
  Administered 2018-12-03: 650 mg via ORAL

## 2018-12-03 MED ORDER — SODIUM CHLORIDE 0.9 % IV SOLN
Freq: Once | INTRAVENOUS | Status: AC
  Administered 2018-12-03: 13:00:00 via INTRAVENOUS
  Filled 2018-12-03: qty 250

## 2018-12-03 MED ORDER — FAMOTIDINE IN NACL 20-0.9 MG/50ML-% IV SOLN
INTRAVENOUS | Status: AC
  Filled 2018-12-03: qty 50

## 2018-12-03 MED ORDER — PROCHLORPERAZINE MALEATE 10 MG PO TABS
ORAL_TABLET | ORAL | Status: AC
  Filled 2018-12-03: qty 1

## 2018-12-03 MED ORDER — DIPHENHYDRAMINE HCL 25 MG PO CAPS
ORAL_CAPSULE | ORAL | Status: AC
  Filled 2018-12-03: qty 2

## 2018-12-03 MED ORDER — SODIUM CHLORIDE 0.9 % IV SOLN
10.0000 mg/kg | Freq: Once | INTRAVENOUS | Status: AC
  Administered 2018-12-03: 700 mg via INTRAVENOUS
  Filled 2018-12-03: qty 16

## 2018-12-03 MED ORDER — DIPHENHYDRAMINE HCL 25 MG PO CAPS
50.0000 mg | ORAL_CAPSULE | Freq: Once | ORAL | Status: AC
  Administered 2018-12-03: 50 mg via ORAL

## 2018-12-03 MED ORDER — PROCHLORPERAZINE MALEATE 10 MG PO TABS
10.0000 mg | ORAL_TABLET | Freq: Once | ORAL | Status: AC
  Administered 2018-12-03: 10 mg via ORAL

## 2018-12-03 MED ORDER — DEXAMETHASONE SODIUM PHOSPHATE 10 MG/ML IJ SOLN
INTRAMUSCULAR | Status: AC
  Filled 2018-12-03: qty 1

## 2018-12-03 MED ORDER — FAMOTIDINE IN NACL 20-0.9 MG/50ML-% IV SOLN
20.0000 mg | Freq: Once | INTRAVENOUS | Status: AC
  Administered 2018-12-03: 20 mg via INTRAVENOUS

## 2018-12-03 MED ORDER — ACETAMINOPHEN 325 MG PO TABS
ORAL_TABLET | ORAL | Status: AC
  Filled 2018-12-03: qty 2

## 2018-12-03 NOTE — Progress Notes (Signed)
OK to treat today with creat-1.4 per Dr. Marin Olp.

## 2018-12-03 NOTE — Patient Instructions (Signed)
[place] Discharge Instructions for Patients Receiving Chemotherapy  Today you received the following chemotherapy agents:  Elotuzumab  To help prevent nausea and vomiting after your treatment, we encourage you to take your nausea medication as ordered per MD.    If you develop nausea and vomiting that is not controlled by your nausea medication, call the clinic.   BELOW ARE SYMPTOMS THAT SHOULD BE REPORTED IMMEDIATELY:  *FEVER GREATER THAN 100.5 F  *CHILLS WITH OR WITHOUT FEVER  NAUSEA AND VOMITING THAT IS NOT CONTROLLED WITH YOUR NAUSEA MEDICATION  *UNUSUAL SHORTNESS OF BREATH  *UNUSUAL BRUISING OR BLEEDING  TENDERNESS IN MOUTH AND THROAT WITH OR WITHOUT PRESENCE OF ULCERS  *URINARY PROBLEMS  *BOWEL PROBLEMS  UNUSUAL RASH Items with * indicate a potential emergency and should be followed up as soon as possible.  Feel free to call the clinic should you have any questions or concerns. The clinic phone number is [phone].  Please show the [place] at check-in to the Emergency Department and triage nurse.

## 2018-12-09 ENCOUNTER — Telehealth: Payer: Self-pay | Admitting: *Deleted

## 2018-12-09 ENCOUNTER — Telehealth: Payer: Self-pay

## 2018-12-09 DIAGNOSIS — M25551 Pain in right hip: Secondary | ICD-10-CM | POA: Diagnosis not present

## 2018-12-09 NOTE — Telephone Encounter (Signed)
Left VM re: appt tomorrow. Left detailed VM re: canceling R/T Covid 19 exposure/concerns. Left Rad Onc number for return call. Loma Sousa, RN BSN

## 2018-12-09 NOTE — Telephone Encounter (Signed)
Received phone call from this patient stating that he is cancelling 12-10-18 fu with Dr. Sondra Come, and that he doesn't care to reschedule @ this time, notified Sharee Pimple RN for Dr. Sondra Come

## 2018-12-10 ENCOUNTER — Inpatient Hospital Stay: Payer: Medicare HMO

## 2018-12-10 ENCOUNTER — Ambulatory Visit: Payer: Medicare HMO | Admitting: Radiation Oncology

## 2018-12-10 ENCOUNTER — Other Ambulatory Visit: Payer: Self-pay

## 2018-12-10 VITALS — BP 159/68 | HR 77 | Temp 98.1°F | Resp 18

## 2018-12-10 DIAGNOSIS — C9000 Multiple myeloma not having achieved remission: Secondary | ICD-10-CM

## 2018-12-10 DIAGNOSIS — C9001 Multiple myeloma in remission: Secondary | ICD-10-CM

## 2018-12-10 DIAGNOSIS — M272 Inflammatory conditions of jaws: Secondary | ICD-10-CM

## 2018-12-10 DIAGNOSIS — Z79899 Other long term (current) drug therapy: Secondary | ICD-10-CM | POA: Diagnosis not present

## 2018-12-10 DIAGNOSIS — C9002 Multiple myeloma in relapse: Secondary | ICD-10-CM | POA: Diagnosis not present

## 2018-12-10 DIAGNOSIS — Z5112 Encounter for antineoplastic immunotherapy: Secondary | ICD-10-CM | POA: Diagnosis not present

## 2018-12-10 DIAGNOSIS — D649 Anemia, unspecified: Secondary | ICD-10-CM | POA: Diagnosis not present

## 2018-12-10 DIAGNOSIS — N289 Disorder of kidney and ureter, unspecified: Secondary | ICD-10-CM | POA: Diagnosis not present

## 2018-12-10 DIAGNOSIS — R5381 Other malaise: Secondary | ICD-10-CM

## 2018-12-10 DIAGNOSIS — Z923 Personal history of irradiation: Secondary | ICD-10-CM | POA: Diagnosis not present

## 2018-12-10 LAB — CBC WITH DIFFERENTIAL (CANCER CENTER ONLY)
Abs Immature Granulocytes: 0.05 10*3/uL (ref 0.00–0.07)
Basophils Absolute: 0.2 10*3/uL — ABNORMAL HIGH (ref 0.0–0.1)
Basophils Relative: 3 %
Eosinophils Absolute: 0.6 10*3/uL — ABNORMAL HIGH (ref 0.0–0.5)
Eosinophils Relative: 11 %
HCT: 29.3 % — ABNORMAL LOW (ref 39.0–52.0)
Hemoglobin: 9.6 g/dL — ABNORMAL LOW (ref 13.0–17.0)
Immature Granulocytes: 1 %
Lymphocytes Relative: 6 %
Lymphs Abs: 0.3 10*3/uL — ABNORMAL LOW (ref 0.7–4.0)
MCH: 29.5 pg (ref 26.0–34.0)
MCHC: 32.8 g/dL (ref 30.0–36.0)
MCV: 90.2 fL (ref 80.0–100.0)
Monocytes Absolute: 0.5 10*3/uL (ref 0.1–1.0)
Monocytes Relative: 9 %
Neutro Abs: 4.1 10*3/uL (ref 1.7–7.7)
Neutrophils Relative %: 70 %
Platelet Count: 207 10*3/uL (ref 150–400)
RBC: 3.25 MIL/uL — ABNORMAL LOW (ref 4.22–5.81)
RDW: 15 % (ref 11.5–15.5)
WBC Count: 5.7 10*3/uL (ref 4.0–10.5)
nRBC: 0 % (ref 0.0–0.2)

## 2018-12-10 LAB — CMP (CANCER CENTER ONLY)
ALT: 10 U/L (ref 0–44)
AST: 15 U/L (ref 15–41)
Albumin: 3.4 g/dL — ABNORMAL LOW (ref 3.5–5.0)
Alkaline Phosphatase: 79 U/L (ref 38–126)
Anion gap: 6 (ref 5–15)
BUN: 26 mg/dL — ABNORMAL HIGH (ref 8–23)
CO2: 25 mmol/L (ref 22–32)
Calcium: 9.6 mg/dL (ref 8.9–10.3)
Chloride: 107 mmol/L (ref 98–111)
Creatinine: 1.49 mg/dL — ABNORMAL HIGH (ref 0.61–1.24)
GFR, Est AFR Am: 49 mL/min — ABNORMAL LOW (ref >60)
GFR, Estimated: 42 mL/min — ABNORMAL LOW (ref >60)
Glucose, Bld: 104 mg/dL — ABNORMAL HIGH (ref 70–99)
Potassium: 4.1 mmol/L (ref 3.5–5.1)
Sodium: 138 mmol/L (ref 135–145)
Total Bilirubin: 0.6 mg/dL (ref 0.3–1.2)
Total Protein: 7.7 g/dL (ref 6.5–8.1)

## 2018-12-10 LAB — RETICULOCYTES
Immature Retic Fract: 8.5 % (ref 2.3–15.9)
RBC.: 3.22 MIL/uL — ABNORMAL LOW (ref 4.22–5.81)
Retic Count, Absolute: 40.3 10*3/uL (ref 19.0–186.0)
Retic Ct Pct: 1.3 % (ref 0.4–3.1)

## 2018-12-10 MED ORDER — PROCHLORPERAZINE MALEATE 10 MG PO TABS
ORAL_TABLET | ORAL | Status: AC
  Filled 2018-12-10: qty 1

## 2018-12-10 MED ORDER — DARBEPOETIN ALFA 300 MCG/0.6ML IJ SOSY
PREFILLED_SYRINGE | INTRAMUSCULAR | Status: AC
  Filled 2018-12-10: qty 0.6

## 2018-12-10 MED ORDER — DEXAMETHASONE SODIUM PHOSPHATE 10 MG/ML IJ SOLN
8.0000 mg | Freq: Once | INTRAMUSCULAR | Status: AC
  Administered 2018-12-10: 8 mg via INTRAVENOUS

## 2018-12-10 MED ORDER — PROCHLORPERAZINE MALEATE 10 MG PO TABS
10.0000 mg | ORAL_TABLET | Freq: Once | ORAL | Status: AC
  Administered 2018-12-10: 10 mg via ORAL

## 2018-12-10 MED ORDER — ACETAMINOPHEN 325 MG PO TABS
650.0000 mg | ORAL_TABLET | Freq: Once | ORAL | Status: AC
  Administered 2018-12-10: 15:00:00 650 mg via ORAL

## 2018-12-10 MED ORDER — FAMOTIDINE IN NACL 20-0.9 MG/50ML-% IV SOLN
INTRAVENOUS | Status: AC
  Filled 2018-12-10: qty 50

## 2018-12-10 MED ORDER — DIPHENHYDRAMINE HCL 25 MG PO CAPS
50.0000 mg | ORAL_CAPSULE | Freq: Once | ORAL | Status: AC
  Administered 2018-12-10: 50 mg via ORAL

## 2018-12-10 MED ORDER — DIPHENHYDRAMINE HCL 25 MG PO CAPS
ORAL_CAPSULE | ORAL | Status: AC
  Filled 2018-12-10: qty 2

## 2018-12-10 MED ORDER — DEXAMETHASONE SODIUM PHOSPHATE 10 MG/ML IJ SOLN
INTRAMUSCULAR | Status: AC
  Filled 2018-12-10: qty 1

## 2018-12-10 MED ORDER — DARBEPOETIN ALFA 300 MCG/0.6ML IJ SOSY
300.0000 ug | PREFILLED_SYRINGE | INTRAMUSCULAR | Status: DC
  Administered 2018-12-10: 16:00:00 300 ug via SUBCUTANEOUS

## 2018-12-10 MED ORDER — ACETAMINOPHEN 325 MG PO TABS
ORAL_TABLET | ORAL | Status: AC
  Filled 2018-12-10: qty 2

## 2018-12-10 MED ORDER — SODIUM CHLORIDE 0.9 % IV SOLN
Freq: Once | INTRAVENOUS | Status: AC
  Administered 2018-12-10: 15:00:00 via INTRAVENOUS
  Filled 2018-12-10: qty 250

## 2018-12-10 MED ORDER — FAMOTIDINE IN NACL 20-0.9 MG/50ML-% IV SOLN
20.0000 mg | Freq: Once | INTRAVENOUS | Status: AC
  Administered 2018-12-10: 15:00:00 20 mg via INTRAVENOUS

## 2018-12-10 MED ORDER — SODIUM CHLORIDE 0.9 % IV SOLN
10.0000 mg/kg | Freq: Once | INTRAVENOUS | Status: AC
  Administered 2018-12-10: 700 mg via INTRAVENOUS
  Filled 2018-12-10: qty 16

## 2018-12-10 NOTE — Patient Instructions (Signed)
[facility] Discharge Instructions for Patients Receiving Chemotherapy  Today you received the following chemotherapy agents: Empliciti  To help prevent nausea and vomiting after your treatment, we encourage you to take your nausea medication  as prescribed.    If you develop nausea and vomiting that is not controlled by your nausea medication, call the clinic.   BELOW ARE SYMPTOMS THAT SHOULD BE REPORTED IMMEDIATELY:  *FEVER GREATER THAN 100.5 F  *CHILLS WITH OR WITHOUT FEVER  NAUSEA AND VOMITING THAT IS NOT CONTROLLED WITH YOUR NAUSEA MEDICATION  *UNUSUAL SHORTNESS OF BREATH  *UNUSUAL BRUISING OR BLEEDING  TENDERNESS IN MOUTH AND THROAT WITH OR WITHOUT PRESENCE OF ULCERS  *URINARY PROBLEMS  *BOWEL PROBLEMS  UNUSUAL RASH Items with * indicate a potential emergency and should be followed up as soon as possible.  Feel free to call the clinic should you have any questions or concerns. The clinic phone number is [phone].  Please show the CHEMO ALERT CARD at check-in to the Emergency Department and triage nurse.   

## 2018-12-11 ENCOUNTER — Other Ambulatory Visit: Payer: Self-pay | Admitting: *Deleted

## 2018-12-11 DIAGNOSIS — C9002 Multiple myeloma in relapse: Secondary | ICD-10-CM

## 2018-12-11 LAB — IRON AND TIBC
Iron: 40 ug/dL — ABNORMAL LOW (ref 42–163)
Saturation Ratios: 18 % — ABNORMAL LOW (ref 20–55)
TIBC: 220 ug/dL (ref 202–409)
UIBC: 180 ug/dL (ref 117–376)

## 2018-12-11 LAB — KAPPA/LAMBDA LIGHT CHAINS
Kappa free light chain: 25.8 mg/L — ABNORMAL HIGH (ref 3.3–19.4)
Kappa, lambda light chain ratio: 0.03 — ABNORMAL LOW (ref 0.26–1.65)
Lambda free light chains: 1020.7 mg/L — ABNORMAL HIGH (ref 5.7–26.3)

## 2018-12-11 LAB — IGG, IGA, IGM
IgA: 78 mg/dL (ref 61–437)
IgG (Immunoglobin G), Serum: 2730 mg/dL — ABNORMAL HIGH (ref 603–1613)
IgM (Immunoglobulin M), Srm: 30 mg/dL (ref 15–143)

## 2018-12-11 LAB — FERRITIN: Ferritin: 125 ng/mL (ref 24–336)

## 2018-12-11 MED ORDER — POMALIDOMIDE 2 MG PO CAPS: 2.0000 mg | ORAL_CAPSULE | Freq: Every day | ORAL | 0 refills | Status: DC

## 2018-12-15 LAB — PROTEIN ELECTROPHORESIS, SERUM, WITH REFLEX
A/G Ratio: 0.8 (ref 0.7–1.7)
Albumin ELP: 3.2 g/dL (ref 2.9–4.4)
Alpha-1-Globulin: 0.3 g/dL (ref 0.0–0.4)
Alpha-2-Globulin: 0.9 g/dL (ref 0.4–1.0)
Beta Globulin: 0.8 g/dL (ref 0.7–1.3)
Gamma Globulin: 2.1 g/dL — ABNORMAL HIGH (ref 0.4–1.8)
Globulin, Total: 4.1 g/dL — ABNORMAL HIGH (ref 2.2–3.9)
M-Spike, %: 1.8 g/dL — ABNORMAL HIGH
SPEP Interpretation: 0
Total Protein ELP: 7.3 g/dL (ref 6.0–8.5)

## 2018-12-15 LAB — IMMUNOFIXATION REFLEX, SERUM
IgA: 78 mg/dL (ref 61–437)
IgG (Immunoglobin G), Serum: 2842 mg/dL — ABNORMAL HIGH (ref 603–1613)
IgM (Immunoglobulin M), Srm: 31 mg/dL (ref 15–143)

## 2018-12-16 DIAGNOSIS — Z4789 Encounter for other orthopedic aftercare: Secondary | ICD-10-CM | POA: Diagnosis not present

## 2018-12-16 DIAGNOSIS — R262 Difficulty in walking, not elsewhere classified: Secondary | ICD-10-CM | POA: Diagnosis not present

## 2018-12-16 DIAGNOSIS — R2681 Unsteadiness on feet: Secondary | ICD-10-CM | POA: Diagnosis not present

## 2018-12-16 DIAGNOSIS — M6281 Muscle weakness (generalized): Secondary | ICD-10-CM | POA: Diagnosis not present

## 2018-12-17 ENCOUNTER — Other Ambulatory Visit: Payer: Self-pay

## 2018-12-17 ENCOUNTER — Inpatient Hospital Stay: Payer: Medicare HMO | Attending: Hematology & Oncology

## 2018-12-17 ENCOUNTER — Inpatient Hospital Stay: Payer: Medicare HMO

## 2018-12-17 VITALS — BP 149/72 | HR 61 | Temp 97.5°F | Resp 16

## 2018-12-17 DIAGNOSIS — D649 Anemia, unspecified: Secondary | ICD-10-CM | POA: Insufficient documentation

## 2018-12-17 DIAGNOSIS — R300 Dysuria: Secondary | ICD-10-CM | POA: Diagnosis not present

## 2018-12-17 DIAGNOSIS — G629 Polyneuropathy, unspecified: Secondary | ICD-10-CM | POA: Insufficient documentation

## 2018-12-17 DIAGNOSIS — R634 Abnormal weight loss: Secondary | ICD-10-CM | POA: Insufficient documentation

## 2018-12-17 DIAGNOSIS — Z923 Personal history of irradiation: Secondary | ICD-10-CM | POA: Diagnosis not present

## 2018-12-17 DIAGNOSIS — C9001 Multiple myeloma in remission: Secondary | ICD-10-CM

## 2018-12-17 DIAGNOSIS — C9002 Multiple myeloma in relapse: Secondary | ICD-10-CM | POA: Diagnosis present

## 2018-12-17 DIAGNOSIS — M2550 Pain in unspecified joint: Secondary | ICD-10-CM | POA: Diagnosis not present

## 2018-12-17 DIAGNOSIS — M791 Myalgia, unspecified site: Secondary | ICD-10-CM | POA: Diagnosis not present

## 2018-12-17 DIAGNOSIS — Z5112 Encounter for antineoplastic immunotherapy: Secondary | ICD-10-CM | POA: Diagnosis not present

## 2018-12-17 DIAGNOSIS — N289 Disorder of kidney and ureter, unspecified: Secondary | ICD-10-CM | POA: Diagnosis not present

## 2018-12-17 DIAGNOSIS — C9000 Multiple myeloma not having achieved remission: Secondary | ICD-10-CM

## 2018-12-17 DIAGNOSIS — Z79899 Other long term (current) drug therapy: Secondary | ICD-10-CM | POA: Diagnosis not present

## 2018-12-17 DIAGNOSIS — K5900 Constipation, unspecified: Secondary | ICD-10-CM | POA: Insufficient documentation

## 2018-12-17 DIAGNOSIS — R5383 Other fatigue: Secondary | ICD-10-CM | POA: Insufficient documentation

## 2018-12-17 LAB — CBC WITH DIFFERENTIAL (CANCER CENTER ONLY)
Abs Immature Granulocytes: 0.03 10*3/uL (ref 0.00–0.07)
Basophils Absolute: 0.2 10*3/uL — ABNORMAL HIGH (ref 0.0–0.1)
Basophils Relative: 3 %
Eosinophils Absolute: 0.7 10*3/uL — ABNORMAL HIGH (ref 0.0–0.5)
Eosinophils Relative: 13 %
HCT: 30.7 % — ABNORMAL LOW (ref 39.0–52.0)
Hemoglobin: 9.9 g/dL — ABNORMAL LOW (ref 13.0–17.0)
Immature Granulocytes: 1 %
Lymphocytes Relative: 7 %
Lymphs Abs: 0.4 10*3/uL — ABNORMAL LOW (ref 0.7–4.0)
MCH: 29.5 pg (ref 26.0–34.0)
MCHC: 32.2 g/dL (ref 30.0–36.0)
MCV: 91.4 fL (ref 80.0–100.0)
Monocytes Absolute: 1.2 10*3/uL — ABNORMAL HIGH (ref 0.1–1.0)
Monocytes Relative: 21 %
Neutro Abs: 3.2 10*3/uL (ref 1.7–7.7)
Neutrophils Relative %: 55 %
Platelet Count: 220 10*3/uL (ref 150–400)
RBC: 3.36 MIL/uL — ABNORMAL LOW (ref 4.22–5.81)
RDW: 15.8 % — ABNORMAL HIGH (ref 11.5–15.5)
WBC Count: 5.7 10*3/uL (ref 4.0–10.5)
nRBC: 0 % (ref 0.0–0.2)

## 2018-12-17 LAB — CMP (CANCER CENTER ONLY)
ALT: 11 U/L (ref 0–44)
AST: 16 U/L (ref 15–41)
Albumin: 3.3 g/dL — ABNORMAL LOW (ref 3.5–5.0)
Alkaline Phosphatase: 84 U/L (ref 38–126)
Anion gap: 5 (ref 5–15)
BUN: 25 mg/dL — ABNORMAL HIGH (ref 8–23)
CO2: 24 mmol/L (ref 22–32)
Calcium: 9.1 mg/dL (ref 8.9–10.3)
Chloride: 107 mmol/L (ref 98–111)
Creatinine: 1.37 mg/dL — ABNORMAL HIGH (ref 0.61–1.24)
GFR, Est AFR Am: 54 mL/min — ABNORMAL LOW (ref >60)
GFR, Estimated: 46 mL/min — ABNORMAL LOW (ref >60)
Glucose, Bld: 87 mg/dL (ref 70–99)
Potassium: 4.4 mmol/L (ref 3.5–5.1)
Sodium: 136 mmol/L (ref 135–145)
Total Bilirubin: 0.6 mg/dL (ref 0.3–1.2)
Total Protein: 7.4 g/dL (ref 6.5–8.1)

## 2018-12-17 LAB — LACTATE DEHYDROGENASE: LDH: 177 U/L (ref 98–192)

## 2018-12-17 MED ORDER — SODIUM CHLORIDE 0.9 % IV SOLN
10.0000 mg/kg | Freq: Once | INTRAVENOUS | Status: AC
  Administered 2018-12-17: 700 mg via INTRAVENOUS
  Filled 2018-12-17: qty 16

## 2018-12-17 MED ORDER — SODIUM CHLORIDE 0.9 % IV SOLN
Freq: Once | INTRAVENOUS | Status: AC
  Administered 2018-12-17: 13:00:00 via INTRAVENOUS
  Filled 2018-12-17: qty 250

## 2018-12-17 MED ORDER — FAMOTIDINE IN NACL 20-0.9 MG/50ML-% IV SOLN
INTRAVENOUS | Status: AC
  Filled 2018-12-17: qty 50

## 2018-12-17 MED ORDER — DIPHENHYDRAMINE HCL 25 MG PO CAPS
50.0000 mg | ORAL_CAPSULE | Freq: Once | ORAL | Status: AC
  Administered 2018-12-17: 50 mg via ORAL

## 2018-12-17 MED ORDER — ACETAMINOPHEN 325 MG PO TABS
ORAL_TABLET | ORAL | Status: AC
  Filled 2018-12-17: qty 2

## 2018-12-17 MED ORDER — DEXAMETHASONE SODIUM PHOSPHATE 10 MG/ML IJ SOLN
INTRAMUSCULAR | Status: AC
  Filled 2018-12-17: qty 1

## 2018-12-17 MED ORDER — PROCHLORPERAZINE MALEATE 10 MG PO TABS
ORAL_TABLET | ORAL | Status: AC
  Filled 2018-12-17: qty 1

## 2018-12-17 MED ORDER — FAMOTIDINE IN NACL 20-0.9 MG/50ML-% IV SOLN
20.0000 mg | Freq: Once | INTRAVENOUS | Status: AC
  Administered 2018-12-17: 20 mg via INTRAVENOUS

## 2018-12-17 MED ORDER — ACETAMINOPHEN 325 MG PO TABS
650.0000 mg | ORAL_TABLET | Freq: Once | ORAL | Status: AC
  Administered 2018-12-17: 650 mg via ORAL

## 2018-12-17 MED ORDER — DEXAMETHASONE SODIUM PHOSPHATE 10 MG/ML IJ SOLN
8.0000 mg | Freq: Once | INTRAMUSCULAR | Status: AC
  Administered 2018-12-17: 8 mg via INTRAVENOUS

## 2018-12-17 MED ORDER — PROCHLORPERAZINE MALEATE 10 MG PO TABS
10.0000 mg | ORAL_TABLET | Freq: Once | ORAL | Status: AC
  Administered 2018-12-17: 10 mg via ORAL

## 2018-12-17 MED ORDER — DIPHENHYDRAMINE HCL 25 MG PO CAPS
ORAL_CAPSULE | ORAL | Status: AC
  Filled 2018-12-17: qty 2

## 2018-12-17 NOTE — Patient Instructions (Signed)
Famotidine injection What is this medicine? FAMOTIDINE (fa MOE ti deen) is a type of antihistamine that blocks the release of stomach acid. It is used to treat stomach or intestinal ulcers. It can relieve ulcer pain and discomfort, and the heartburn from acid reflux. This medicine may be used for other purposes; ask your health care provider or pharmacist if you have questions. COMMON BRAND NAME(S): Pepcid What should I tell my health care provider before I take this medicine? They need to know if you have any of these conditions: -kidney or liver disease -an unusual or allergic reaction to famotidine, other medicines, foods, dyes, or preservatives -pregnant or trying to get pregnant -breast-feeding How should I use this medicine? This medicine is for infusion into a vein. It is given by a health care professional in a hospital or clinic setting. Talk to your pediatrician regarding the use of this medicine in children. Special care may be needed. Overdosage: If you think you have taken too much of this medicine contact a poison control center or emergency room at once. NOTE: This medicine is only for you. Do not share this medicine with others. What if I miss a dose? This does not apply. What may interact with this medicine? -delavirdine -itraconazole -ketoconazole This list may not describe all possible interactions. Give your health care provider a list of all the medicines, herbs, non-prescription drugs, or dietary supplements you use. Also tell them if you smoke, drink alcohol, or use illegal drugs. Some items may interact with your medicine. What should I watch for while using this medicine? Tell your doctor or health care professional if your condition does not start to get better or gets worse. Do not take with aspirin, ibuprofen, or other antiinflammatory medicines. These can aggravate your condition. Do not smoke cigarettes or drink alcohol. These increase irritation in your  stomach and can increase the time it will take for ulcers to heal. Cigarettes and alcohol can also worsen acid reflux or heartburn. If you get black, tarry stools or vomit up what looks like coffee grounds, call your doctor or health care professional at once. You may have a bleeding ulcer. This medicine may cause a decrease in vitamin B12. You should make sure that you get enough vitamin B12 while you are taking this medicine. Discuss the foods you eat and the vitamins you take with your health care professional. What side effects may I notice from receiving this medicine? Side effects that you should report to your doctor or health care professional as soon as possible: -allergic reactions like skin rash, itching or hives, swelling of the face, lips, or tongue -agitation, nervousness -confusion -hallucinations Side effects that usually do not require medical attention (report to your doctor or health care professional if they continue or are bothersome): -constipation -diarrhea -dizziness -headache This list may not describe all possible side effects. Call your doctor for medical advice about side effects. You may report side effects to FDA at 1-800-FDA-1088. Where should I keep my medicine? This medicine is given in a hospital or clinic. You will not be given this medicine to store at home. NOTE: This sheet is a summary. It may not cover all possible information. If you have questions about this medicine, talk to your doctor, pharmacist, or health care provider.  2019 Elsevier/Gold Standard (2017-03-14 13:16:46) Prochlorperazine tablets What is this medicine? PROCHLORPERAZINE (proe klor PER a zeen) helps to control severe nausea and vomiting. This medicine is also used to treat schizophrenia. It can  also help patients who experience anxiety that is not due to psychological illness. This medicine may be used for other purposes; ask your health care provider or pharmacist if you have  questions. COMMON BRAND NAME(S): Compazine What should I tell my health care provider before I take this medicine? They need to know if you have any of these conditions: -blood disorders or disease -dementia -liver disease or jaundice -Parkinson's disease -uncontrollable movement disorder -an unusual or allergic reaction to prochlorperazine, other medicines, foods, dyes, or preservatives -pregnant or trying to get pregnant -breast-feeding How should I use this medicine? Take this medicine by mouth with a glass of water. Follow the directions on the prescription label. Take your doses at regular intervals. Do not take your medicine more often than directed. Do not stop taking this medicine suddenly. This can cause nausea, vomiting, and dizziness. Ask your doctor or health care professional for advice. Talk to your pediatrician regarding the use of this medicine in children. Special care may be needed. While this drug may be prescribed for children as young as 2 years for selected conditions, precautions do apply. Overdosage: If you think you have taken too much of this medicine contact a poison control center or emergency room at once. NOTE: This medicine is only for you. Do not share this medicine with others. What if I miss a dose? If you miss a dose, take it as soon as you can. If it is almost time for your next dose, take only that dose. Do not take double or extra doses. What may interact with this medicine? Do not take this medicine with any of the following medications: -amoxapine -antidepressants like citalopram, escitalopram, fluoxetine, paroxetine, and sertraline -deferoxamine -dofetilide -maprotiline -tricyclic antidepressants like amitriptyline, clomipramine, imipramine, nortiptyline and others This medicine may also interact with the following medications: -lithium -medicines for pain -phenytoin -propranolol -warfarin This list may not describe all possible interactions.  Give your health care provider a list of all the medicines, herbs, non-prescription drugs, or dietary supplements you use. Also tell them if you smoke, drink alcohol, or use illegal drugs. Some items may interact with your medicine. What should I watch for while using this medicine? Visit your doctor or health care professional for regular checks on your progress. You may get drowsy or dizzy. Do not drive, use machinery, or do anything that needs mental alertness until you know how this medicine affects you. Do not stand or sit up quickly, especially if you are an older patient. This reduces the risk of dizzy or fainting spells. Alcohol may interfere with the effect of this medicine. Avoid alcoholic drinks. This medicine can reduce the response of your body to heat or cold. Dress warm in cold weather and stay hydrated in hot weather. If possible, avoid extreme temperatures like saunas, hot tubs, very hot or cold showers, or activities that can cause dehydration such as vigorous exercise. This medicine can make you more sensitive to the sun. Keep out of the sun. If you cannot avoid being in the sun, wear protective clothing and use sunscreen. Do not use sun lamps or tanning beds/booths. Your mouth may get dry. Chewing sugarless gum or sucking hard candy, and drinking plenty of water may help. Contact your doctor if the problem does not go away or is severe. What side effects may I notice from receiving this medicine? Side effects that you should report to your doctor or health care professional as soon as possible: -blurred vision -breast enlargement in men  or women -breast milk in women who are not breast-feeding -chest pain, fast or irregular heartbeat -confusion, restlessness -dark yellow or brown urine -difficulty breathing or swallowing -dizziness or fainting spells -drooling, shaking, movement difficulty (shuffling walk) or rigidity -fever, chills, sore throat -involuntary or uncontrollable  movements of the eyes, mouth, head, arms, and legs -seizures -stomach area pain -unusually weak or tired -unusual bleeding or bruising -yellowing of skin or eyes Side effects that usually do not require medical attention (report to your doctor or health care professional if they continue or are bothersome): -difficulty passing urine -difficulty sleeping -headache -sexual dysfunction -skin rash, or itching This list may not describe all possible side effects. Call your doctor for medical advice about side effects. You may report side effects to FDA at 1-800-FDA-1088. Where should I keep my medicine? Keep out of the reach of children. Store at room temperature between 15 and 30 degrees C (59 and 86 degrees F). Protect from light. Throw away any unused medicine after the expiration date. NOTE: This sheet is a summary. It may not cover all possible information. If you have questions about this medicine, talk to your doctor, pharmacist, or health care provider.  2019 Elsevier/Gold Standard (2011-12-17 16:59:39) Acetaminophen tablets or caplets Qu es este medicamento? El ACETAMINOFENO es un analgsico. Se utiliza para tratar los dolores leves y para Engineer, materials fiebre. Este medicamento puede ser utilizado para otros usos; si tiene alguna pregunta consulte con su proveedor de atencin mdica o con su farmacutico. MARCAS COMUNES: Aceta, Actamin, Anacin Aspirin Free, Genapap, Genebs, Mapap, Pain & Fever, Pain and Fever, PAIN RELIEF, PAIN RELIEF Extra Strength, Pain Reliever, Panadol, PHARBETOL, Q-Pap, Q-Pap Extra Strength, Tylenol, Tylenol CrushableTablet, Tylenol Extra Strength, XS No Aspirin, XS Pain Reliever Qu le debo informar a mi profesional de la salud antes de tomar este medicamento? Necesita saber si usted presenta alguno de los siguientes problemas o situaciones: -si consume alcohol con frecuencia -enfermedad heptica -una reaccin alrgica o inusual al acetaminofeno, a otros  medicamentos, alimentos, colorantes o conservantes -si est embarazada o buscando quedar embarazada -si est amamantando a un beb Cmo debo utilizar este medicamento? Tome este medicamento por va oral con un vaso de agua. Siga las instrucciones de la etiqueta o del envase del medicamento. Tome sus dosis a intervalos regulares. No tome su medicamento con una frecuencia mayor a la indicada. Hable con su pediatra para informarse acerca del uso de este medicamento en nios. Aunque este medicamento ha sido recetado a nios tan menores como de 6 aos de edad para condiciones selectivas, las precauciones se aplican. Sobredosis: Pngase en contacto inmediatamente con un centro toxicolgico o una sala de urgencia si usted cree que haya tomado demasiado medicamento. ATENCIN: [employer] es solo para usted. No comparta este medicamento con nadie. Qu sucede si me olvido de una dosis? Si olvida una dosis, tmela lo antes posible. Si es casi la hora de la prxima dosis, tome slo esa dosis. No tome dosis adicionales o dobles. Qu puede interactuar con este medicamento? -alcohol -imatinib -isoniazida -otros medicamentos con acetaminofeno Puede ser que esta lista no menciona todas las posibles interacciones. Informe a su profesional de [employer]	[place] de [employer] productos a base de hierbas, medicamentos de [place] o suplementos nutritivos que est tomando. Si usted fuma, consume bebidas alcohlicas o si utiliza drogas ilegales, indqueselo tambin a su profesional de [employer]	[place]. Algunas sustancias pueden interactuar con su medicamento. A qu debo estar atento al usar [employer]? Informe a su  mdico o profesional de Theme park manager dura ms de 10 das (5 das en nios), si [place], o si tiene un dolor nuevo o diferente. Tambin consulte con su mdico si la fiebre dura ms de 3 das. No tome con este medicamento otros medicamentos que contengan acetaminofeno. Siempre lea atentamente las  etiquetas. Si tiene [place] duda, pregntele a su mdico o farmacutico. Si toma demasiado acetaminofeno, busque ayuda mdica de inmediato. Demasiado acetaminofeno puede ser muy peligroso y causar dao heptico. Incluso si no tiene sntomas, es importante obtener ayuda de inmediato. Qu efectos secundarios puedo tener al [employer] este medicamento? Efectos secundarios que debe informar a su mdico o a Barrister's clerk de la salud tan pronto como sea posible: -Chief of Staff como erupcin cutnea, picazn o urticarias, hinchazn de la cara, labios o lengua -problemas respiratorios -fiebre o dolor de garganta -enrojecimiento, formacin de ampollas, descamacin o distensin de la piel, inclusive dentro de la boca -dificultad para orinar o cambios en el volumen de orina -sangrado o magulladuras inusuales -cansancio o debilidad inusual -color amarillento de ojos o piel Efectos secundarios que, por lo general, no requieren atencin mdica (debe informarlos a su mdico o a su profesional de la salud si persisten o si son molestos): -dolor de cabeza -nuseas, Higher education careers adviser Puede ser que esta lista no menciona todos los posibles efectos secundarios. Comunquese a su mdico por asesoramiento mdico [employer] Usted puede informar los efectos secundarios a la FDA por telfono al 1-800-FDA-1088. Dnde debo guardar mi medicina? Mantngala fuera del alcance de los nios. Gurdela a [employer], entre 20 y 82 grados C (41 y 64 grados F). Protjala de la humedad y del Freight forwarder. Deseche todo el medicamento que no haya utilizado, despus de la fecha de vencimiento. ATENCIN: Este folleto es un resumen. Puede ser que no cubra toda la posible informacin. Si usted tiene preguntas acerca de esta medicina, consulte con su mdico, su farmacutico o su profesional de Technical sales engineer.  2019 Elsevier/Gold Standard (2016-08-29 00:00:00) Diphenhydramine injection What is this  medicine? DIPHENHYDRAMINE (dye fen HYE dra meen) is an antihistamine. It is used to treat the symptoms of an allergic reaction and motion sickness. It is also used to treat Parkinson's disease. This medicine may be used for other purposes; ask your health care provider or pharmacist if you have questions. COMMON BRAND NAME(S): Benadryl What should I tell my health care provider before I take this medicine? They need to know if you have any of these conditions: -asthma or lung disease -glaucoma -high blood pressure or heart disease -liver disease -pain or difficulty passing urine -prostate trouble -ulcers or other stomach problems -an unusual or allergic reaction to diphenhydramine, antihistamines, other medicines foods, dyes, or preservatives -pregnant or trying to get pregnant -breast-feeding How should I use this medicine? This medicine is for injection into a vein or a muscle. It is usually given by a health care professional in a hospital or clinic setting. If you get this medicine at home, you will be taught how to prepare and give this medicine. Use exactly as directed. Take your medicine at regular intervals. Do not take your medicine more often than directed. It is important that you put your used needles and syringes in a special sharps container. Do not put them in a trash can. If you do not have a sharps container, call your pharmacist or healthcare provider to get one. Talk to your pediatrician regarding the use of this medicine in children. While  this drug may be prescribed for selected conditions, precautions do apply. This medicine is not approved for use in newborns and premature babies. Patients over 37 years old may have a stronger reaction and need a smaller dose. Overdosage: If you think you have taken too much of this medicine contact a poison control center or emergency room at once. NOTE: This medicine is only for you. Do not share this medicine with others. What if I  miss a dose? If you miss a dose, take it as soon as you can. If it is almost time for your next dose, take only that dose. Do not take double or extra doses. What may interact with this medicine? Do not take this medicine with any of the following medications: -MAOIs like Carbex, Eldepryl, Marplan, Nardil, and Parnate This medicine may also interact with the following medications: -alcohol -barbiturates, like phenobarbital -medicines for bladder spasm like oxybutynin, tolterodine -medicines for blood pressure -medicines for depression, anxiety, or psychotic disturbances -medicines for movement abnormalities or Parkinson's disease -medicines for sleep -other medicines for cold, cough or allergy -some medicines for the stomach like chlordiazepoxide, dicyclomine This list may not describe all possible interactions. Give your health care provider a list of all the medicines, herbs, non-prescription drugs, or dietary supplements you use. Also tell them if you smoke, drink alcohol, or use illegal drugs. Some items may interact with your medicine. What should I watch for while using this medicine? Your condition will be monitored carefully while you are receiving this medicine. Tell your doctor or healthcare professional if your symptoms do not start to get better or if they get worse. You may get drowsy or dizzy. Do not drive, use machinery, or do anything that needs mental alertness until you know how this medicine affects you. Do not stand or sit up quickly, especially if you are an older patient. This reduces the risk of dizzy or fainting spells. Alcohol may interfere with the effect of this medicine. Avoid alcoholic drinks. Your mouth may get dry. Chewing sugarless gum or sucking hard candy, and drinking plenty of water may help. Contact your doctor if the problem does not go away or is severe. What side effects may I notice from receiving this medicine? Side effects that you should report to your  doctor or health care professional as soon as possible: -allergic reactions like skin rash, itching or hives, swelling of the face, lips, or tongue -breathing problems -changes in vision -chills -confused, agitated, nervous -irregular or fast heartbeat -low blood pressure -seizures -tremor -trouble passing urine -unusual bleeding or bruising -unusually weak or tired Side effects that usually do not require medical attention (report to your doctor or health care professional if they continue or are bothersome): -constipation, diarrhea -drowsy -headache -loss of appetite -stomach upset, vomiting -sweating -thick mucous This list may not describe all possible side effects. Call your doctor for medical advice about side effects. You may report side effects to FDA at 1-800-FDA-1088. Where should I keep my medicine? Keep out of the reach of children. If you are using this medicine at home, you will be instructed on how to store this medicine. Throw away any unused medicine after the expiration date on the label. NOTE: This sheet is a summary. It may not cover all possible information. If you have questions about this medicine, talk to your doctor, pharmacist, or health care provider.  2019 Elsevier/Gold Standard (2007-11-17 14:28:35) Dexamethasone injection What is this medicine? DEXAMETHASONE (dex a METH a sone) is  a corticosteroid. It is used to treat inflammation of the skin, joints, lungs, and other organs. Common conditions treated include asthma, allergies, and arthritis. It is also used for other conditions, like blood disorders and diseases of the adrenal glands. This medicine may be used for other purposes; ask your health care provider or pharmacist if you have questions. COMMON BRAND NAME(S): Decadron, DoubleDex, Simplist Dexamethasone, Solurex What should I tell my health care provider before I take this medicine? They need to know if you have any of these conditions: -blood  clotting problems -Cushing's syndrome -diabetes -glaucoma -heart problems or disease -high blood pressure -infection like herpes, measles, tuberculosis, or chickenpox -kidney disease -liver disease -mental problems -myasthenia gravis -osteoporosis -previous heart attack -seizures -stomach, ulcer or intestine disease including colitis and diverticulitis -thyroid problem -an unusual or allergic reaction to dexamethasone, corticosteroids, other medicines, lactose, foods, dyes, or preservatives -pregnant or trying to get pregnant -breast-feeding How should I use this medicine? This medicine is for injection into a muscle, joint, lesion, soft tissue, or vein. It is given by a health care professional in a hospital or clinic setting. Talk to your pediatrician regarding the use of this medicine in children. Special care may be needed. Overdosage: If you think you have taken too much of this medicine contact a poison control center or emergency room at once. NOTE: This medicine is only for you. Do not share this medicine with others. What if I miss a dose? This may not apply. If you are having a series of injections over a prolonged period, try not to miss an appointment. Call your doctor or health care professional to reschedule if you are unable to keep an appointment. What may interact with this medicine? Do not take this medicine with any of the following medications: -mifepristone, RU-486 -vaccines This medicine may also interact with the following medications: -amphotericin B -antibiotics like clarithromycin, erythromycin, and troleandomycin -aspirin and aspirin-like drugs -barbiturates like phenobarbital -carbamazepine -cholestyramine -cholinesterase inhibitors like donepezil, galantamine, rivastigmine, and tacrine -cyclosporine -digoxin -diuretics -ephedrine -male hormones, like estrogens or progestins and birth control  pills -indinavir -isoniazid -ketoconazole -medicines for diabetes -medicines that improve muscle tone or strength for conditions like myasthenia gravis -NSAIDs, medicines for pain and inflammation, like ibuprofen or naproxen -phenytoin -rifampin -thalidomide -warfarin This list may not describe all possible interactions. Give your health care provider a list of all the medicines, herbs, non-prescription drugs, or dietary supplements you use. Also tell them if you smoke, drink alcohol, or use illegal drugs. Some items may interact with your medicine. What should I watch for while using this medicine? Your condition will be monitored carefully while you are receiving this medicine. If you are taking this medicine for a long time, carry an identification card with your name and address, the type and dose of your medicine, and your doctor's name and address. This medicine may increase your risk of getting an infection. Stay away from people who are sick. Tell your doctor or health care professional if you are around anyone with measles or chickenpox. Talk to your health care provider before you get any vaccines that you take this medicine. If you are going to have surgery, tell your doctor or health care professional that you have taken this medicine within the last twelve months. Ask your doctor or health care professional about your diet. You may need to lower the amount of salt you eat. The medicine can increase your blood sugar. If you are a diabetic check with  your doctor if you need help adjusting the dose of your diabetic medicine. What side effects may I notice from receiving this medicine? Side effects that you should report to your doctor or health care professional as soon as possible: -allergic reactions like skin rash, itching or hives, swelling of the face, lips, or tongue -black or tarry stools -change in the amount of urine -changes in vision -confusion, excitement, restlessness,  a false sense of well-being -fever, sore throat, sneezing, cough, or other signs of infection, wounds that will not heal -hallucinations -increased thirst -mental depression, mood swings, mistaken feelings of self importance or of being mistreated -pain in hips, back, ribs, arms, shoulders, or legs -pain, redness, or irritation at the injection site -redness, blistering, peeling or loosening of the skin, including inside the mouth -rounding out of face -swelling of feet or lower legs -unusual bleeding or bruising -unusual tired or weak -wounds that do not heal Side effects that usually do not require medical attention (report to your doctor or health care professional if they continue or are bothersome): -diarrhea or constipation -change in taste -headache -nausea, vomiting -skin problems, acne, thin and shiny skin -touble sleeping -unusual growth of hair on the face or body -weight gain This list may not describe all possible side effects. Call your doctor for medical advice about side effects. You may report side effects to FDA at 1-800-FDA-1088. Where should I keep my medicine? This drug is given in a hospital or clinic and will not be stored at home. NOTE: This sheet is a summary. It may not cover all possible information. If you have questions about this medicine, talk to your doctor, pharmacist, or health care provider.  2019 Elsevier/Gold Standard (2007-11-19 14:04:12) Elotuzumab injection What is this medicine? ELOTUZUMAB (el oh tooz ue mab) is a monoclonal antibody. It is used to treat multiple myeloma. This medicine may be used for other purposes; ask your health care provider or pharmacist if you have questions. COMMON BRAND NAME(S): Empliciti What should I tell my health care provider before I take this medicine? They need to know if you have any of these conditions: -hepatic disease -infection -an unusual or allergic reaction to elotuzumab, other medicines, foods,  dyes, or preservatives -pregnant or trying to get pregnant -breast-feeding How should I use this medicine? This medicine is for infusion into a vein. It is given by a health care professional in a hospital or clinic setting. Talk to your pediatrician regarding the use of this medicine in children. Special care may be needed. Overdosage: If you think you have taken too much of this medicine contact a poison control center or emergency room at once. NOTE: This medicine is only for you. Do not share this medicine with others. What if I miss a dose? Keep appointments for follow-up doses as directed. It is important not to miss your dose. Call your doctor or health care professional if you are unable to keep an appointment. What may interact with this medicine? Interactions have not been studied. Give your health care provider a list of all the medicines, herbs, non-prescription drugs, or dietary supplements you use. Also tell them if you smoke, drink alcohol, or use illegal drugs. Some items may interact with your medicine. This list may not describe all possible interactions. Give your health care provider a list of all the medicines, herbs, non-prescription drugs, or dietary supplements you use. Also tell them if you smoke, drink alcohol, or use illegal drugs. Some items may interact with  your medicine. What should I watch for while using this medicine? This drug may make you feel generally unwell. Report any side effects. Continue your course of treatment even though you feel ill unless your doctor tells you to stop. This medicine can cause serious allergic reactions. To reduce your risk you may need to take medicine before treatment with this medicine. Take your medicine as directed. You may need blood work done while you are taking this medicine. This medicine can affect the results of some tests used to determine treatment response; extra tests may be needed to evaluate response. Talk to your  doctor about your risk of cancer. You may be more at risk for certain types of cancers if you take this medicine. Women should inform their doctor if they wish to become pregnant or think they might be pregnant. There is a potential for serious side effects to an unborn child. Talk to your health care professional or pharmacist for more information. Do not breast-feed an infant while taking this medicine. What side effects may I notice from receiving this medicine? Side effects that you should report to your doctor or health care professional as soon as possible: -allergic reactions like skin rash, itching or hives, swelling of the face, lips, or tongue -breathing problems -chest pain -dizziness -lightheaded -signs and symptoms of high blood sugar such as dry mouth, dry skin, fruity breath, stomach pain, increased hunger, increased thirst, increased urination -signs and symptoms of infection like fever or chills; cough; sore throat; pain or trouble passing urine -signs and symptoms of liver injury like dark yellow or brown urine; general ill feeling or flu-like symptoms; light-colored stools; loss of appetite; nausea; right upper belly pain; unusually weak or tired; yellowing of the eyes or skin Side effects that usually do not require medical attention (report to your doctor or health care professional if they continue or are bothersome): -constipation -decreased appetite -diarrhea -pain, tingling, numbness in the hands or feet -tiredness This list may not describe all possible side effects. Call your doctor for medical advice about side effects. You may report side effects to FDA at 1-800-FDA-1088. Where should I keep my medicine? Keep out of the reach of children. This drug is given in a hospital or clinic and will not be stored at home. NOTE: This sheet is a summary. It may not cover all possible information. If you have questions about this medicine, talk to your doctor, pharmacist, or  health care provider.  2019 Elsevier/Gold Standard (2017-06-20 08:81:10)

## 2018-12-18 DIAGNOSIS — M6281 Muscle weakness (generalized): Secondary | ICD-10-CM | POA: Diagnosis not present

## 2018-12-18 DIAGNOSIS — R262 Difficulty in walking, not elsewhere classified: Secondary | ICD-10-CM | POA: Diagnosis not present

## 2018-12-18 DIAGNOSIS — R2681 Unsteadiness on feet: Secondary | ICD-10-CM | POA: Diagnosis not present

## 2018-12-18 DIAGNOSIS — Z4789 Encounter for other orthopedic aftercare: Secondary | ICD-10-CM | POA: Diagnosis not present

## 2018-12-21 DIAGNOSIS — Z4789 Encounter for other orthopedic aftercare: Secondary | ICD-10-CM | POA: Diagnosis not present

## 2018-12-21 DIAGNOSIS — R2681 Unsteadiness on feet: Secondary | ICD-10-CM | POA: Diagnosis not present

## 2018-12-21 DIAGNOSIS — M6281 Muscle weakness (generalized): Secondary | ICD-10-CM | POA: Diagnosis not present

## 2018-12-21 DIAGNOSIS — R262 Difficulty in walking, not elsewhere classified: Secondary | ICD-10-CM | POA: Diagnosis not present

## 2018-12-22 DIAGNOSIS — R2681 Unsteadiness on feet: Secondary | ICD-10-CM | POA: Diagnosis not present

## 2018-12-22 DIAGNOSIS — M6281 Muscle weakness (generalized): Secondary | ICD-10-CM | POA: Diagnosis not present

## 2018-12-22 DIAGNOSIS — R262 Difficulty in walking, not elsewhere classified: Secondary | ICD-10-CM | POA: Diagnosis not present

## 2018-12-22 DIAGNOSIS — Z4789 Encounter for other orthopedic aftercare: Secondary | ICD-10-CM | POA: Diagnosis not present

## 2018-12-23 DIAGNOSIS — R262 Difficulty in walking, not elsewhere classified: Secondary | ICD-10-CM | POA: Diagnosis not present

## 2018-12-23 DIAGNOSIS — Z4789 Encounter for other orthopedic aftercare: Secondary | ICD-10-CM | POA: Diagnosis not present

## 2018-12-23 DIAGNOSIS — M6281 Muscle weakness (generalized): Secondary | ICD-10-CM | POA: Diagnosis not present

## 2018-12-23 DIAGNOSIS — R2681 Unsteadiness on feet: Secondary | ICD-10-CM | POA: Diagnosis not present

## 2018-12-24 ENCOUNTER — Other Ambulatory Visit: Payer: Self-pay

## 2018-12-24 ENCOUNTER — Inpatient Hospital Stay: Payer: Medicare HMO

## 2018-12-24 ENCOUNTER — Inpatient Hospital Stay (HOSPITAL_BASED_OUTPATIENT_CLINIC_OR_DEPARTMENT_OTHER): Payer: Medicare HMO | Admitting: Hematology & Oncology

## 2018-12-24 ENCOUNTER — Other Ambulatory Visit: Payer: Self-pay | Admitting: *Deleted

## 2018-12-24 ENCOUNTER — Encounter: Payer: Self-pay | Admitting: Hematology & Oncology

## 2018-12-24 VITALS — BP 145/67 | HR 64 | Temp 98.0°F | Resp 18 | Wt 144.0 lb

## 2018-12-24 DIAGNOSIS — N289 Disorder of kidney and ureter, unspecified: Secondary | ICD-10-CM | POA: Diagnosis not present

## 2018-12-24 DIAGNOSIS — C9002 Multiple myeloma in relapse: Secondary | ICD-10-CM

## 2018-12-24 DIAGNOSIS — R634 Abnormal weight loss: Secondary | ICD-10-CM

## 2018-12-24 DIAGNOSIS — R300 Dysuria: Secondary | ICD-10-CM | POA: Diagnosis not present

## 2018-12-24 DIAGNOSIS — Z923 Personal history of irradiation: Secondary | ICD-10-CM | POA: Diagnosis not present

## 2018-12-24 DIAGNOSIS — K5900 Constipation, unspecified: Secondary | ICD-10-CM | POA: Diagnosis not present

## 2018-12-24 DIAGNOSIS — D649 Anemia, unspecified: Secondary | ICD-10-CM

## 2018-12-24 DIAGNOSIS — Z79899 Other long term (current) drug therapy: Secondary | ICD-10-CM | POA: Diagnosis not present

## 2018-12-24 DIAGNOSIS — M2550 Pain in unspecified joint: Secondary | ICD-10-CM

## 2018-12-24 DIAGNOSIS — Z5112 Encounter for antineoplastic immunotherapy: Secondary | ICD-10-CM | POA: Diagnosis not present

## 2018-12-24 DIAGNOSIS — R5383 Other fatigue: Secondary | ICD-10-CM | POA: Diagnosis not present

## 2018-12-24 DIAGNOSIS — G629 Polyneuropathy, unspecified: Secondary | ICD-10-CM | POA: Diagnosis not present

## 2018-12-24 DIAGNOSIS — M791 Myalgia, unspecified site: Secondary | ICD-10-CM

## 2018-12-24 LAB — CMP (CANCER CENTER ONLY)
ALT: 13 U/L (ref 0–44)
AST: 16 U/L (ref 15–41)
Albumin: 3.3 g/dL — ABNORMAL LOW (ref 3.5–5.0)
Alkaline Phosphatase: 80 U/L (ref 38–126)
Anion gap: 5 (ref 5–15)
BUN: 29 mg/dL — ABNORMAL HIGH (ref 8–23)
CO2: 24 mmol/L (ref 22–32)
Calcium: 9 mg/dL (ref 8.9–10.3)
Chloride: 108 mmol/L (ref 98–111)
Creatinine: 1.42 mg/dL — ABNORMAL HIGH (ref 0.61–1.24)
GFR, Est AFR Am: 51 mL/min — ABNORMAL LOW (ref >60)
GFR, Estimated: 44 mL/min — ABNORMAL LOW (ref >60)
Glucose, Bld: 80 mg/dL (ref 70–99)
Potassium: 4.6 mmol/L (ref 3.5–5.1)
Sodium: 137 mmol/L (ref 135–145)
Total Bilirubin: 0.7 mg/dL (ref 0.3–1.2)
Total Protein: 7.4 g/dL (ref 6.5–8.1)

## 2018-12-24 LAB — CBC WITH DIFFERENTIAL (CANCER CENTER ONLY)
Abs Immature Granulocytes: 0.02 10*3/uL (ref 0.00–0.07)
Basophils Absolute: 0.1 10*3/uL (ref 0.0–0.1)
Basophils Relative: 2 %
Eosinophils Absolute: 1 10*3/uL — ABNORMAL HIGH (ref 0.0–0.5)
Eosinophils Relative: 20 %
HCT: 32.4 % — ABNORMAL LOW (ref 39.0–52.0)
Hemoglobin: 10.4 g/dL — ABNORMAL LOW (ref 13.0–17.0)
Immature Granulocytes: 0 %
Lymphocytes Relative: 8 %
Lymphs Abs: 0.4 10*3/uL — ABNORMAL LOW (ref 0.7–4.0)
MCH: 29 pg (ref 26.0–34.0)
MCHC: 32.1 g/dL (ref 30.0–36.0)
MCV: 90.3 fL (ref 80.0–100.0)
Monocytes Absolute: 1.1 10*3/uL — ABNORMAL HIGH (ref 0.1–1.0)
Monocytes Relative: 22 %
Neutro Abs: 2.3 10*3/uL (ref 1.7–7.7)
Neutrophils Relative %: 48 %
Platelet Count: 183 10*3/uL (ref 150–400)
RBC: 3.59 MIL/uL — ABNORMAL LOW (ref 4.22–5.81)
RDW: 15.9 % — ABNORMAL HIGH (ref 11.5–15.5)
WBC Count: 4.8 10*3/uL (ref 4.0–10.5)
nRBC: 0 % (ref 0.0–0.2)

## 2018-12-24 NOTE — Progress Notes (Signed)
DISCONTINUE ON PATHWAY REGIMEN - Multiple Myeloma and Other Plasma Cell Dyscrasias     A cycle is every 28 days:     Dexamethasone      Dexamethasone      Elotuzumab      Pomalidomide      Dexamethasone      Dexamethasone      Dexamethasone      Elotuzumab   **Always confirm dose/schedule in your pharmacy ordering system**  REASON: Disease Progression PRIOR TREATMENT: [id number]: EPd (Elotuzumab + Pomalidomide + Dexamethasone) q28 Days Until Progression or Unacceptable Toxicity TREATMENT RESPONSE: Progressive Disease (PD)  START ON PATHWAY REGIMEN - Multiple Myeloma and Other Plasma Cell Dyscrasias     Cycles 1 and 2: A cycle is every 28 days:     Daratumumab    Cycles 3 through 6: A cycle is every 28 days:     Daratumumab    Cycles 7 and beyond: A cycle is every 28 days:     Daratumumab   **Always confirm dose/schedule in your pharmacy ordering system**  Patient Characteristics: Relapsed / Refractory, Second through [employer] of Therapy R-ISS Staging: Not Applicable Disease Classification: Relapsed Line of Therapy: Fourth Line Intent of Therapy: Non-Curative / Palliative Intent, Discussed with Patient

## 2018-12-25 ENCOUNTER — Other Ambulatory Visit: Payer: Self-pay | Admitting: *Deleted

## 2018-12-25 DIAGNOSIS — Z4789 Encounter for other orthopedic aftercare: Secondary | ICD-10-CM | POA: Diagnosis not present

## 2018-12-25 DIAGNOSIS — M6281 Muscle weakness (generalized): Secondary | ICD-10-CM | POA: Diagnosis not present

## 2018-12-25 DIAGNOSIS — R262 Difficulty in walking, not elsewhere classified: Secondary | ICD-10-CM | POA: Diagnosis not present

## 2018-12-25 DIAGNOSIS — R2681 Unsteadiness on feet: Secondary | ICD-10-CM | POA: Diagnosis not present

## 2018-12-25 LAB — KAPPA/LAMBDA LIGHT CHAINS
Kappa free light chain: 34 mg/L — ABNORMAL HIGH (ref 3.3–19.4)
Kappa, lambda light chain ratio: 0.03 — ABNORMAL LOW (ref 0.26–1.65)
Lambda free light chains: 1049.7 mg/L — ABNORMAL HIGH (ref 5.7–26.3)

## 2018-12-25 LAB — IGG, IGA, IGM
IgA: 59 mg/dL — ABNORMAL LOW (ref 61–437)
IgG (Immunoglobin G), Serum: 2676 mg/dL — ABNORMAL HIGH (ref 603–1613)
IgM (Immunoglobulin M), Srm: 30 mg/dL (ref 15–143)

## 2018-12-25 MED ORDER — TRAMADOL HCL 50 MG PO TABS: 50.0000 mg | ORAL_TABLET | Freq: Three times a day (TID) | ORAL | 0 refills | Status: DC | PRN

## 2018-12-26 NOTE — Progress Notes (Signed)
Hematology and Oncology Follow Up Visit  Christian Burns [id number] [birth year] 85 y.o. 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  Unfortunately, he is already progressing.  His M spike is 1.8 g/dL.  His IgG level is 2676 mg/dL.  His lambda light chain is 105 mg/dL.  We will have to make a change in his protocol.  I talked to him about this.  I think we should try daratumumab.  I think this is reasonable.  I really would have to go back to actual chemotherapy.  Our options just are not that great.  Given his overall performance status, how much or how well he would tolerate chemotherapy.  I think that daratumumab would have a decent chance of working.  As always, Christian Burns priority is his quality of life so we can help take care of his wife who is debilitated by arthritis.  He is recovered very nicely from the hip surgery on the right hip for the pathologic fracture.  He had radiation for this area.  He is getting around fairly well.  He has had no problems with nausea or vomiting.  He has had no problems with bowels or bladder.   There is been no leg swelling.  Overall, I was his performance status is ECOG 2.  Medications:  Allergies as of 12/24/2018      Reactions   Sulfa Antibiotics Itching   Sulfasalazine Itching      Medication List       Accurate as of Dec 24, 2018 11:59 PM. If you have any questions, ask your nurse or doctor.        amLODipine-benazepril 5-10 MG capsule Commonly known as:  LOTREL TAKE 1 CAPSULE BY MOUTH DAILY   finasteride 5 MG tablet Commonly known as:  PROSCAR Take 5 mg by mouth daily.   montelukast 10 MG tablet Commonly known as:  Singulair Take 1 tablet (10 mg total) by mouth at bedtime.   multivitamin with minerals Tabs tablet Take 1 tablet by mouth daily.   penicillin v potassium 500 MG tablet Commonly known as:  VEETID Take 1 tablet (500 mg total) by mouth 4 (four) times daily.   pomalidomide 2 MG capsule Commonly known as:  POMALYST Take 1 capsule (2 mg total) by mouth daily. Take with water on days 1-21. Repeat every 28 days. [id number]   PROBIOTIC PO Take 1 capsule by mouth daily.   pyridOXINE 100 MG tablet Commonly known as:  VITAMIN B-6 Take 100 mg by mouth daily.   traMADol 50 MG tablet Commonly known as:  ULTRAM Take 50 mg by mouth every 8 (eight) hours as needed for  pain.   vitamin B-12 500 MCG tablet Commonly known as:  CYANOCOBALAMIN Take 500 mcg by mouth daily.   Vitamin D (Ergocalciferol) 1.25 MG (50000 UT) Caps capsule Commonly known as:  DRISDOL Take 50,000 Units by mouth every Sunday.       Allergies:  Allergies  Allergen Reactions  . Sulfa Antibiotics Itching  . Sulfasalazine Itching    Past Medical History, Surgical history, Social history, and Family History were reviewed and updated.  Review of Systems: Review of Systems  Constitutional: Positive for weight loss.  HENT: Negative.   Eyes: Negative.   Respiratory: Negative.   Cardiovascular: Negative.   Gastrointestinal: Negative.   Genitourinary: Positive for  dysuria.  Musculoskeletal: Positive for joint pain and myalgias.  Skin: Negative.   Neurological: Negative.   Endo/Heme/Allergies: Negative.   Psychiatric/Behavioral: Negative.      Physical Exam:  weight is 144 lb (65.3 kg). His oral temperature is 98 F (36.7 C). His blood pressure is 145/67 (abnormal) and his pulse is 64. His respiration is 18 and oxygen saturation is 100%.   Wt Readings from Last 3 Encounters:  12/24/18 144 lb (65.3 kg)  11/26/18 140 lb (63.5 kg)  11/26/18 142 lb (64.4 kg)    Physical Exam Vitals signs reviewed.  HENT:     Head: Normocephalic and atraumatic.  Eyes:     Pupils: Pupils are equal, round, and reactive to light.  Neck:     Musculoskeletal: Normal range of motion.  Cardiovascular:     Rate and Rhythm: Normal rate and regular rhythm.     Heart sounds: Normal heart sounds.  Pulmonary:     Effort: Pulmonary effort is normal.     Breath sounds: Normal breath sounds.  Abdominal:     General: Bowel sounds are normal.     Palpations: Abdomen is soft.  Musculoskeletal: Normal range of motion.        General: No tenderness or deformity.  Lymphadenopathy:     Cervical: No cervical adenopathy.  Skin:    General: Skin is warm and dry.     Findings: No erythema or rash.  Neurological:     Mental Status: He is alert and oriented to person, place, and time.  Psychiatric:        Behavior: Behavior normal.        Thought Content: Thought content normal.        Judgment: Judgment normal.      Lab Results  Component Value Date   WBC 4.8 12/24/2018   HGB 10.4 (L) 12/24/2018   HCT 32.4 (L) 12/24/2018   MCV 90.3 12/24/2018   PLT 183 12/24/2018   Lab Results  Component Value Date   FERRITIN 125 12/10/2018   IRON 40 (L) 12/10/2018   TIBC 220 12/10/2018   UIBC 180 12/10/2018   IRONPCTSAT 18 (L) 12/10/2018   Lab Results  Component Value Date   RETICCTPCT 1.3 12/10/2018   RBC 3.59 (L) 12/24/2018   Lab Results  Component Value Date    KPAFRELGTCHN 34.0 (H) 12/24/2018   LAMBDASER 1,049.7 (H) 12/24/2018   KAPLAMBRATIO 0.03 (L) 12/24/2018   Lab Results  Component Value Date   IGGSERUM 2,676 (H) 12/24/2018   IGA 59 (L) 12/24/2018   IGMSERUM 30 12/24/2018   Lab Results  Component Value Date   TOTALPROTELP 7.3 12/10/2018   ALBUMINELP 3.2 12/10/2018   A1GS 0.3 12/10/2018   A2GS 0.9 12/10/2018   BETS 0.8 12/10/2018   BETA2SER 0.3 07/28/2015  GAMS 2.1 (H) 12/10/2018   MSPIKE 1.8 (H) 12/10/2018   SPEI Comment 11/26/2018     Chemistry      Component Value Date/Time   NA 137 12/24/2018 1213   NA 146 (H) 08/07/2017 1153   NA 141 01/09/2017 1004   K 4.6 12/24/2018 1213   K 4.6 08/07/2017 1153   K 4.4 01/09/2017 1004   CL 108 12/24/2018 1213   CL 108 08/07/2017 1153   CO2 24 12/24/2018 1213   CO2 26 08/07/2017 1153   CO2 22 01/09/2017 1004   BUN 29 (H) 12/24/2018 1213   BUN 24 (H) 08/07/2017 1153   BUN 23.9 01/09/2017 1004   CREATININE 1.42 (H) 12/24/2018 1213   CREATININE 1.56 (H) 05/26/2018 1508   CREATININE 1.5 (H) 01/09/2017 1004      Component Value Date/Time   CALCIUM 9.0 12/24/2018 1213   CALCIUM 8.8 08/07/2017 1153   CALCIUM 8.8 01/09/2017 1004   ALKPHOS 80 12/24/2018 1213   ALKPHOS 97 (H) 08/07/2017 1153   ALKPHOS 80 01/09/2017 1004   AST 16 12/24/2018 1213   AST 18 01/09/2017 1004   ALT 13 12/24/2018 1213   ALT 20 08/07/2017 1153   ALT 12 01/09/2017 1004   BILITOT 0.7 12/24/2018 1213   BILITOT 0.92 01/09/2017 1004       Impression and Plan: Mr. Krejci is a very pleasant 85 yo caucasian gentleman with relapsed IgG lambda myeloma.   His focus of therapy has always been taking care of his wife.  He wants to be able to help her.  She is crippled by arthritis.  We will try to get started with the daratumumab next week.  He already is on Singulair so that should help with any reaction.  Hopefully, we will see a response.  I do know that there are other options that we have.  I just think  that the daratumumab would be easiest for him for right now.  I spent about 40 minutes with Mr. Tiu.  I went over my recommendations.  I explained to him about the new treatment protocol.  I gave them information about the daratumumab.  I put his protocol and.  We will hopefully find out within a month or so that he is responding.    We really need to follow him weekly for right now.   [name], MD 5/16/202011:27 AM

## 2018-12-28 DIAGNOSIS — R262 Difficulty in walking, not elsewhere classified: Secondary | ICD-10-CM | POA: Diagnosis not present

## 2018-12-28 DIAGNOSIS — R2681 Unsteadiness on feet: Secondary | ICD-10-CM | POA: Diagnosis not present

## 2018-12-28 DIAGNOSIS — Z4789 Encounter for other orthopedic aftercare: Secondary | ICD-10-CM | POA: Diagnosis not present

## 2018-12-28 DIAGNOSIS — M6281 Muscle weakness (generalized): Secondary | ICD-10-CM | POA: Diagnosis not present

## 2018-12-28 LAB — IMMUNOFIXATION REFLEX, SERUM
IgA: 63 mg/dL (ref 61–437)
IgG (Immunoglobin G), Serum: 2711 mg/dL — ABNORMAL HIGH (ref 603–1613)
IgM (Immunoglobulin M), Srm: 33 mg/dL (ref 15–143)

## 2018-12-28 LAB — PROTEIN ELECTROPHORESIS, SERUM, WITH REFLEX
A/G Ratio: 0.8 (ref 0.7–1.7)
Albumin ELP: 3 g/dL (ref 2.9–4.4)
Alpha-1-Globulin: 0.3 g/dL (ref 0.0–0.4)
Alpha-2-Globulin: 0.8 g/dL (ref 0.4–1.0)
Beta Globulin: 0.7 g/dL (ref 0.7–1.3)
Gamma Globulin: 2 g/dL — ABNORMAL HIGH (ref 0.4–1.8)
Globulin, Total: 3.8 g/dL (ref 2.2–3.9)
M-Spike, %: 1.8 g/dL — ABNORMAL HIGH
SPEP Interpretation: 0
Total Protein ELP: 6.8 g/dL (ref 6.0–8.5)

## 2018-12-29 ENCOUNTER — Other Ambulatory Visit: Payer: Self-pay | Admitting: Hematology & Oncology

## 2018-12-29 DIAGNOSIS — R262 Difficulty in walking, not elsewhere classified: Secondary | ICD-10-CM | POA: Diagnosis not present

## 2018-12-29 DIAGNOSIS — R2681 Unsteadiness on feet: Secondary | ICD-10-CM | POA: Diagnosis not present

## 2018-12-29 DIAGNOSIS — Z4789 Encounter for other orthopedic aftercare: Secondary | ICD-10-CM | POA: Diagnosis not present

## 2018-12-29 DIAGNOSIS — I10 Essential (primary) hypertension: Secondary | ICD-10-CM

## 2018-12-29 DIAGNOSIS — M6281 Muscle weakness (generalized): Secondary | ICD-10-CM | POA: Diagnosis not present

## 2018-12-30 DIAGNOSIS — R2681 Unsteadiness on feet: Secondary | ICD-10-CM | POA: Diagnosis not present

## 2018-12-30 DIAGNOSIS — M6281 Muscle weakness (generalized): Secondary | ICD-10-CM | POA: Diagnosis not present

## 2018-12-30 DIAGNOSIS — R262 Difficulty in walking, not elsewhere classified: Secondary | ICD-10-CM | POA: Diagnosis not present

## 2018-12-30 DIAGNOSIS — Z4789 Encounter for other orthopedic aftercare: Secondary | ICD-10-CM | POA: Diagnosis not present

## 2018-12-31 ENCOUNTER — Other Ambulatory Visit: Payer: Self-pay

## 2018-12-31 ENCOUNTER — Other Ambulatory Visit: Payer: Self-pay | Admitting: *Deleted

## 2018-12-31 ENCOUNTER — Inpatient Hospital Stay: Payer: Medicare HMO

## 2018-12-31 ENCOUNTER — Inpatient Hospital Stay (HOSPITAL_BASED_OUTPATIENT_CLINIC_OR_DEPARTMENT_OTHER): Payer: Medicare HMO | Admitting: Family

## 2018-12-31 VITALS — BP 142/79 | HR 75 | Temp 98.4°F | Resp 17 | Wt 141.8 lb

## 2018-12-31 DIAGNOSIS — D649 Anemia, unspecified: Secondary | ICD-10-CM

## 2018-12-31 DIAGNOSIS — R112 Nausea with vomiting, unspecified: Secondary | ICD-10-CM

## 2018-12-31 DIAGNOSIS — C9002 Multiple myeloma in relapse: Secondary | ICD-10-CM | POA: Diagnosis not present

## 2018-12-31 DIAGNOSIS — N289 Disorder of kidney and ureter, unspecified: Secondary | ICD-10-CM

## 2018-12-31 DIAGNOSIS — R634 Abnormal weight loss: Secondary | ICD-10-CM

## 2018-12-31 DIAGNOSIS — K5900 Constipation, unspecified: Secondary | ICD-10-CM | POA: Diagnosis not present

## 2018-12-31 DIAGNOSIS — R5383 Other fatigue: Secondary | ICD-10-CM | POA: Diagnosis not present

## 2018-12-31 DIAGNOSIS — M791 Myalgia, unspecified site: Secondary | ICD-10-CM

## 2018-12-31 DIAGNOSIS — G629 Polyneuropathy, unspecified: Secondary | ICD-10-CM | POA: Diagnosis not present

## 2018-12-31 DIAGNOSIS — Z5112 Encounter for antineoplastic immunotherapy: Secondary | ICD-10-CM | POA: Diagnosis not present

## 2018-12-31 DIAGNOSIS — M2550 Pain in unspecified joint: Secondary | ICD-10-CM | POA: Diagnosis not present

## 2018-12-31 DIAGNOSIS — R300 Dysuria: Secondary | ICD-10-CM | POA: Diagnosis not present

## 2018-12-31 DIAGNOSIS — Z79899 Other long term (current) drug therapy: Secondary | ICD-10-CM

## 2018-12-31 DIAGNOSIS — T451X5A Adverse effect of antineoplastic and immunosuppressive drugs, initial encounter: Secondary | ICD-10-CM

## 2018-12-31 DIAGNOSIS — Z923 Personal history of irradiation: Secondary | ICD-10-CM

## 2018-12-31 LAB — CMP (CANCER CENTER ONLY)
ALT: 11 U/L (ref 0–44)
AST: 16 U/L (ref 15–41)
Albumin: 3.2 g/dL — ABNORMAL LOW (ref 3.5–5.0)
Alkaline Phosphatase: 80 U/L (ref 38–126)
Anion gap: 5 (ref 5–15)
BUN: 30 mg/dL — ABNORMAL HIGH (ref 8–23)
CO2: 23 mmol/L (ref 22–32)
Calcium: 9.3 mg/dL (ref 8.9–10.3)
Chloride: 107 mmol/L (ref 98–111)
Creatinine: 1.53 mg/dL — ABNORMAL HIGH (ref 0.61–1.24)
GFR, Est AFR Am: 47 mL/min — ABNORMAL LOW (ref >60)
GFR, Estimated: 41 mL/min — ABNORMAL LOW (ref >60)
Glucose, Bld: 125 mg/dL — ABNORMAL HIGH (ref 70–99)
Potassium: 4.3 mmol/L (ref 3.5–5.1)
Sodium: 135 mmol/L (ref 135–145)
Total Bilirubin: 0.7 mg/dL (ref 0.3–1.2)
Total Protein: 7.4 g/dL (ref 6.5–8.1)

## 2018-12-31 LAB — CBC WITH DIFFERENTIAL (CANCER CENTER ONLY)
Abs Immature Granulocytes: 0.01 10*3/uL (ref 0.00–0.07)
Basophils Absolute: 0.2 10*3/uL — ABNORMAL HIGH (ref 0.0–0.1)
Basophils Relative: 4 %
Eosinophils Absolute: 0.4 10*3/uL (ref 0.0–0.5)
Eosinophils Relative: 8 %
HCT: 32.5 % — ABNORMAL LOW (ref 39.0–52.0)
Hemoglobin: 10.4 g/dL — ABNORMAL LOW (ref 13.0–17.0)
Immature Granulocytes: 0 %
Lymphocytes Relative: 7 %
Lymphs Abs: 0.4 10*3/uL — ABNORMAL LOW (ref 0.7–4.0)
MCH: 28.7 pg (ref 26.0–34.0)
MCHC: 32 g/dL (ref 30.0–36.0)
MCV: 89.8 fL (ref 80.0–100.0)
Monocytes Absolute: 1.3 10*3/uL — ABNORMAL HIGH (ref 0.1–1.0)
Monocytes Relative: 26 %
Neutro Abs: 2.7 10*3/uL (ref 1.7–7.7)
Neutrophils Relative %: 55 %
Platelet Count: 212 10*3/uL (ref 150–400)
RBC: 3.62 MIL/uL — ABNORMAL LOW (ref 4.22–5.81)
RDW: 15.8 % — ABNORMAL HIGH (ref 11.5–15.5)
WBC Count: 4.9 10*3/uL (ref 4.0–10.5)
nRBC: 0 % (ref 0.0–0.2)

## 2018-12-31 LAB — TYPE AND SCREEN
ABO/RH(D): O POS
Antibody Screen: NEGATIVE

## 2018-12-31 LAB — ABO/RH: ABO/RH(D): O POS

## 2018-12-31 MED ORDER — ACETAMINOPHEN 325 MG PO TABS
650.0000 mg | ORAL_TABLET | Freq: Once | ORAL | Status: AC
  Administered 2018-12-31: 09:00:00 650 mg via ORAL

## 2018-12-31 MED ORDER — DIPHENHYDRAMINE HCL 25 MG PO CAPS
ORAL_CAPSULE | ORAL | Status: AC
  Filled 2018-12-31: qty 2

## 2018-12-31 MED ORDER — SODIUM CHLORIDE 0.9 % IV SOLN
INTRAVENOUS | Status: DC
  Administered 2018-12-31: 09:00:00 via INTRAVENOUS
  Filled 2018-12-31: qty 250

## 2018-12-31 MED ORDER — ACYCLOVIR 400 MG PO TABS: 400.0000 mg | ORAL_TABLET | Freq: Two times a day (BID) | ORAL | 11 refills | Status: DC

## 2018-12-31 MED ORDER — DIPHENHYDRAMINE HCL 25 MG PO CAPS
50.0000 mg | ORAL_CAPSULE | Freq: Once | ORAL | Status: AC
  Administered 2018-12-31: 09:00:00 50 mg via ORAL

## 2018-12-31 MED ORDER — ACETAMINOPHEN 325 MG PO TABS
ORAL_TABLET | ORAL | Status: AC
  Filled 2018-12-31: qty 2

## 2018-12-31 MED ORDER — DRONABINOL 5 MG PO CAPS: 5.0000 mg | ORAL_CAPSULE | Freq: Two times a day (BID) | ORAL | 1 refills | Status: DC

## 2018-12-31 MED ORDER — SODIUM CHLORIDE 0.9 % IV SOLN
1000.0000 mg | Freq: Once | INTRAVENOUS | Status: AC
  Administered 2018-12-31: 11:00:00 1000 mg via INTRAVENOUS
  Filled 2018-12-31: qty 40

## 2018-12-31 MED ORDER — METHYLPREDNISOLONE SODIUM SUCC 125 MG IJ SOLR
INTRAMUSCULAR | Status: AC
  Filled 2018-12-31: qty 2

## 2018-12-31 MED ORDER — PROCHLORPERAZINE MALEATE 10 MG PO TABS: 10.0000 mg | ORAL_TABLET | Freq: Four times a day (QID) | ORAL | 1 refills | Status: DC | PRN

## 2018-12-31 MED ORDER — METHYLPREDNISOLONE SODIUM SUCC 125 MG IJ SOLR
100.0000 mg | Freq: Once | INTRAMUSCULAR | Status: AC
  Administered 2018-12-31: 100 mg via INTRAVENOUS

## 2018-12-31 MED ORDER — DEXAMETHASONE 4 MG PO TABS: 4.0000 mg | ORAL_TABLET | Freq: Every day | ORAL | 4 refills | Status: DC

## 2018-12-31 MED ORDER — LORAZEPAM 0.5 MG PO TABS: 0.5000 mg | ORAL_TABLET | Freq: Four times a day (QID) | ORAL | 0 refills | Status: DC | PRN

## 2018-12-31 NOTE — Patient Instructions (Addendum)
Daratumumab injection What is this medicine? DARATUMUMAB (dar a toom ue mab) is a monoclonal antibody. It is used to treat multiple myeloma. This medicine may be used for other purposes; ask your health care provider or pharmacist if you have questions. COMMON BRAND NAME(S): DARZALEX What should I tell my health care provider before I take this medicine? They need to know if you have any of these conditions: -infection (especially a virus infection such as chickenpox, herpes, or hepatitis B virus) -lung or breathing disease -an unusual or allergic reaction to daratumumab, other medicines, foods, dyes, or preservatives -pregnant or trying to get pregnant -breast-feeding How should I use this medicine? This medicine is for infusion into a vein. It is given by a health care professional in a hospital or clinic setting. Talk to your pediatrician regarding the use of this medicine in children. Special care may be needed. Overdosage: If you think you have taken too much of this medicine contact a poison control center or emergency room at once. NOTE: This medicine is only for you. Do not share this medicine with others. What if I miss a dose? Keep appointments for follow-up doses as directed. It is important not to miss your dose. Call your doctor or health care professional if you are unable to keep an appointment. What may interact with this medicine? Interactions have not been studied. Give your health care provider a list of all the medicines, herbs, non-prescription drugs, or dietary supplements you use. Also tell them if you smoke, drink alcohol, or use illegal drugs. Some items may interact with your medicine. This list may not describe all possible interactions. Give your health care provider a list of all the medicines, herbs, non-prescription drugs, or dietary supplements you use. Also tell them if you smoke, drink alcohol, or use illegal drugs. Some items may interact with your  medicine. What should I watch for while using this medicine? This drug may make you feel generally unwell. Report any side effects. Continue your course of treatment even though you feel ill unless your doctor tells you to stop. This medicine can cause serious allergic reactions. To reduce your risk you may need to take medicine before treatment with this medicine. Take your medicine as directed. This medicine can affect the results of blood tests to match your blood type. These changes can last for up to 6 months after the final dose. Your healthcare provider will do blood tests to match your blood type before you start treatment. Tell all of your healthcare providers that you are being treated with this medicine before receiving a blood transfusion. This medicine can affect the results of some tests used to determine treatment response; extra tests may be needed to evaluate response. Do not become pregnant while taking this medicine or for 3 months after stopping it. Women should inform their doctor if they wish to become pregnant or think they might be pregnant. There is a potential for serious side effects to an unborn child. Talk to your health care professional or pharmacist for more information. What side effects may I notice from receiving this medicine? Side effects that you should report to your doctor or health care professional as soon as possible: -allergic reactions like skin rash, itching or hives, swelling of the face, lips, or tongue -breathing problems -chills -cough -dizziness -feeling faint or lightheaded -headache -low blood counts - this medicine may decrease the number of white blood cells, red blood cells and platelets. You may be at increased   risk for infections and bleeding. -nausea, vomiting -shortness of breath -signs of decreased platelets or bleeding - bruising, pinpoint red spots on the skin, black, tarry stools, blood in the urine -signs of decreased red blood  cells - unusually weak or tired, feeling faint or lightheaded, falls -signs of infection - fever or chills, cough, sore throat, pain or difficulty passing urine -signs and symptoms of liver injury like dark yellow or brown urine; general ill feeling or flu-like symptoms; light-colored stools; loss of appetite; right upper belly pain; unusually weak or tired; yellowing of the eyes or skin Side effects that usually do not require medical attention (report to your doctor or health care professional if they continue or are bothersome): -back pain -constipation -loss of appetite -diarrhea -joint pain -muscle cramps -pain, tingling, numbness in the hands or feet -swelling of the ankles, feet, hands -tiredness -trouble sleeping This list may not describe all possible side effects. Call your doctor for medical advice about side effects. You may report side effects to FDA at 1-800-FDA-1088. Where should I keep my medicine? Keep out of the reach of children. This drug is given in a hospital or clinic and will not be stored at home. NOTE: This sheet is a summary. It may not cover all possible information. If you have questions about this medicine, talk to your doctor, pharmacist, or health care provider.  2019 [employer] Standard (2018-02-27 15:52:44) Acetaminophen tablets or caplets What is this medicine? ACETAMINOPHEN (a set a MEE noe fen) is a pain reliever. It is used to treat mild pain and fever. This medicine may be used for other purposes; ask your health care provider or pharmacist if you have questions. COMMON BRAND NAME(S): Aceta, Actamin, Anacin Aspirin Free, Genapap, Genebs, Mapap, Pain & Fever, Pain and Fever, PAIN RELIEF, PAIN RELIEF Extra Strength, Pain Reliever, Panadol, PHARBETOL, Q-Pap, Q-Pap Extra Strength, Tylenol, Tylenol CrushableTablet, Tylenol Extra Strength, XS No Aspirin, XS Pain Reliever What should I tell my health care provider before I take this medicine? They need to  know if you have any of these conditions: -if you often drink alcohol -liver disease -an unusual or allergic reaction to acetaminophen, other medicines, foods, dyes, or preservatives -pregnant or trying to get pregnant -breast-feeding How should I use this medicine? Take this medicine by mouth with a glass of water. Follow the directions on the package or prescription label. Take your medicine at regular intervals. Do not take your medicine more often than directed. Talk to your pediatrician regarding the use of this medicine in children. While this drug may be prescribed for children as young as 30 years of age for selected conditions, precautions do apply. Overdosage: If you think you have taken too much of this medicine contact a poison control center or emergency room at once. NOTE: This medicine is only for you. Do not share this medicine with others. What if I miss a dose? If you miss a dose, take it as soon as you can. If it is almost time for your next dose, take only that dose. Do not take double or extra doses. What may interact with this medicine? -alcohol -imatinib -isoniazid -other medicines with acetaminophen This list may not describe all possible interactions. Give your health care provider a list of all the medicines, herbs, non-prescription drugs, or dietary supplements you use. Also tell them if you smoke, drink alcohol, or use illegal drugs. Some items may interact with your medicine. What should I watch for while using this medicine? Tell  your doctor or health care professional if the pain lasts more than 10 days (5 days for children), if it gets worse, or if there is a new or different kind of pain. Also, check with your doctor if a fever lasts for more than 3 days. Do not take other medicines that contain acetaminophen with this medicine. Always read labels carefully. If you have questions, ask your doctor or pharmacist. If you take too much acetaminophen get medical help  right away. Too much acetaminophen can be very dangerous and cause liver damage. Even if you do not have symptoms, it is important to get help right away. What side effects may I notice from receiving this medicine? Side effects that you should report to your doctor or health care professional as soon as possible: -allergic reactions like skin rash, itching or hives, swelling of the face, lips, or tongue -breathing problems -fever or sore throat -redness, blistering, peeling or loosening of the skin, including inside the mouth -trouble passing urine or change in the amount of urine -unusual bleeding or bruising -unusually weak or tired -yellowing of the eyes or skin Side effects that usually do not require medical attention (report to your doctor or health care professional if they continue or are bothersome): -headache -nausea, stomach upset This list may not describe all possible side effects. Call your doctor for medical advice about side effects. You may report side effects to FDA at 1-800-FDA-1088. Where should I keep my medicine? Keep out of reach of children. Store at room temperature between 20 and 25 degrees C (68 and 77 degrees F). Protect from moisture and heat. Throw away any unused medicine after the expiration date. NOTE: This sheet is a summary. It may not cover all possible information. If you have questions about this medicine, talk to your doctor, pharmacist, or health care provider.  2019 [employer] Standard (2013-03-22 12:54:16) Diphenhydramine capsules or tablets What is this medicine? DIPHENHYDRAMINE (dye fen HYE dra meen) is an antihistamine. It is used to treat the symptoms of an allergic reaction. It is also used to treat Parkinson's disease. This medicine is also used to prevent and to treat motion sickness and as a nighttime sleep aid. This medicine may be used for other purposes; ask your health care provider or pharmacist if you have questions. COMMON BRAND  NAME(S): Alka-Seltzer Plus Allergy, Aller-G-Time, Banophen, Benadryl Allergy, Benadryl Allergy Dye Free, Benadryl Allergy Kapgel, Benadryl Allergy Ultratab, Diphedryl, Diphenhist, Genahist, Geri-Dryl, PHARBEDRYL, Q-Dryl, Gretta Began, Valu-Dryl, Vicks ZzzQuil Nightime Sleep-Aid What should I tell my health care provider before I take this medicine? They need to know if you have any of these conditions: -asthma or lung disease -glaucoma -high blood pressure or heart disease -liver disease -pain or difficulty passing urine -prostate trouble -ulcers or other stomach problems -an unusual or allergic reaction to diphenhydramine, other medicines foods, dyes, or preservatives such as sulfites -pregnant or trying to get pregnant -breast-feeding How should I use this medicine? Take this medicine by mouth with a full glass of water. Follow the directions on the prescription label. Take your doses at regular intervals. Do not take your medicine more often than directed. To prevent motion sickness start taking this medicine 30 to 60 minutes before you leave. Talk to your pediatrician regarding the use of this medicine in children. Special care may be needed. Patients over 46 years old may have a stronger reaction and need a smaller dose. Overdosage: If you think you have taken too much of this medicine contact  a poison control center or emergency room at once. NOTE: This medicine is only for you. Do not share this medicine with others. What if I miss a dose? If you miss a dose, take it as soon as you can. If it is almost time for your next dose, take only that dose. Do not take double or extra doses. What may interact with this medicine? Do not take this medicine with any of the following medications: -MAOIs like Carbex, Eldepryl, Marplan, Nardil, and Parnate This medicine may also interact with the following medications: -alcohol -barbiturates, like phenobarbital -medicines for bladder spasm like  oxybutynin, tolterodine -medicines for blood pressure -medicines for depression, anxiety, or psychotic disturbances -medicines for movement abnormalities or Parkinson's disease -medicines for sleep -other medicines for cold, cough or allergy -some medicines for the stomach like chlordiazepoxide, dicyclomine This list may not describe all possible interactions. Give your health care provider a list of all the medicines, herbs, non-prescription drugs, or dietary supplements you use. Also tell them if you smoke, drink alcohol, or use illegal drugs. Some items may interact with your medicine. What should I watch for while using this medicine? Visit your doctor or health care professional for regular check ups. Tell your doctor if your symptoms do not improve or if they get worse. Your mouth may get dry. Chewing sugarless gum or sucking hard candy, and drinking plenty of water may help. Contact your doctor if the problem does not go away or is severe. This medicine may cause dry eyes and blurred vision. If you wear contact lenses you may feel some discomfort. Lubricating drops may help. See your eye doctor if the problem does not go away or is severe. You may get drowsy or dizzy. Do not drive, use machinery, or do anything that needs mental alertness until you know how this medicine affects you. Do not stand or sit up quickly, especially if you are an older patient. This reduces the risk of dizzy or fainting spells. Alcohol may interfere with the effect of this medicine. Avoid alcoholic drinks. What side effects may I notice from receiving this medicine? Side effects that you should report to your doctor or health care professional as soon as possible: -allergic reactions like skin rash, itching or hives, swelling of the face, lips, or tongue -changes in vision -confused, agitated, nervous -irregular or fast heartbeat -tremor -trouble passing urine -unusual bleeding or bruising -unusually weak or  tired Side effects that usually do not require medical attention (report to your doctor or health care professional if they continue or are bothersome): -constipation, diarrhea -drowsy -headache -loss of appetite -stomach upset, vomiting -thick mucous This list may not describe all possible side effects. Call your doctor for medical advice about side effects. You may report side effects to FDA at 1-800-FDA-1088. Where should I keep my medicine? Keep out of the reach of children. Store at room temperature between 15 and 30 degrees C (59 and 86 degrees F). Keep container closed tightly. Throw away any unused medicine after the expiration date. NOTE: This sheet is a summary. It may not cover all possible information. If you have questions about this medicine, talk to your doctor, pharmacist, or health care provider.  2019 [employer]/Gold Standard (2007-11-16 17:06:22) Methylprednisolone Suspension for Injection What is this medicine? METHYLPREDNISOLONE (meth ill pred NISS oh lone) is a corticosteroid. It is commonly used to treat inflammation of the skin, joints, lungs, and other organs. Common conditions treated include asthma, allergies, and arthritis. It is also  used for other conditions, such as blood disorders and diseases of the adrenal glands. This medicine may be used for other purposes; ask your health care provider or pharmacist if you have questions. COMMON BRAND NAME(S): Depmedalone-40, Depmedalone-80, Depo-Medrol What should I tell my health care provider before I take this medicine? They need to know if you have any of these conditions: -Cushing's syndrome -eye disease, vision problems -diabetes -glaucoma -heart disease -high blood pressure -infection (especially a virus infection such as chickenpox, cold sores, or herpes) -liver disease -mental illness -myasthenia gravis -osteoporosis -recently received or scheduled to receive a vaccine -seizures -stomach or intestine  problems -thyroid disease -an unusual or allergic reaction to lactose, methylprednisolone, other medicines, foods, dyes, or preservatives -pregnant or trying to get pregnant -breast-feeding How should I use this medicine? This medicine is for injection into a muscle, joint, or other tissue. It is given by a health care professional in a hospital or clinic setting. Talk to your pediatrician regarding the use of this medicine in children. While this drug may be prescribed for selected conditions, precautions do apply. Overdosage: If you think you have taken too much of this medicine contact a poison control center or emergency room at once. NOTE: This medicine is only for you. Do not share this medicine with others. What if I miss a dose? This does not apply. What may interact with this medicine? Do not take this medicine with any of the following medications: -alefacept -echinacea -iopamidol -live virus vaccines -metyrapone -mifepristone This medicine may also interact with the following medications: -amphotericin B -aspirin and aspirin-like medicines -certain antibiotics like erythromycin, clarithromycin, troleandomycin -certain medicines for diabetes -certain medicines for fungal infections like ketoconazole -certain medicines for seizures like carbamazepine, phenobarbital, phenytoin -certain medicines that treat or prevent blood clots like warfarin -cyclosporine -digoxin -diuretics -male hormones, like estrogens and birth control pills -isoniazid -NSAIDs, medicines for pain inflammation, like ibuprofen or naproxen -other medicines for myasthenia gravis -rifampin -vaccines This list may not describe all possible interactions. Give your health care provider a list of all the medicines, herbs, non-prescription drugs, or dietary supplements you use. Also tell them if you smoke, drink alcohol, or use illegal drugs. Some items may interact with your medicine. What should I watch  for while using this medicine? Tell your doctor or healthcare professional if your symptoms do not start to get better or if they get worse. Do not stop taking except on your doctor's advice. You may develop a severe reaction. Your doctor will tell you how much medicine to take. Your condition will be monitored carefully while you are receiving this medicine. This medicine may increase your risk of getting an infection. Tell your doctor or health care professional if you are around anyone with measles or chickenpox, or if you develop sores or blisters that do not heal properly. This medicine may affect blood sugar levels. If you have diabetes, check with your doctor or health care professional before you change your diet or the dose of your diabetic medicine. Tell your doctor or health care professional right away if you have any change in your eyesight. Using this medicine for a long time may increase your risk of low bone mass. Talk to your doctor about bone health. What side effects may I notice from receiving this medicine? Side effects that you should report to your doctor or health care professional as soon as possible: -allergic reactions like skin rash, itching or hives, swelling of the face, lips, or tongue -  bloody or tarry stools -changes in vision -hallucination, loss of contact with reality -muscle cramps -muscle pain -palpitations -signs and symptoms of high blood sugar such as dizziness; dry mouth; dry skin; fruity breath; nausea; stomach pain; increased hunger or thirst; increased urination -signs and symptoms of infection like fever or chills; cough; sore throat; pain or trouble passing urine -trouble passing urine or change in the amount of urine Side effects that usually do not require medical attention (report to your doctor or health care professional if they continue or are bothersome): -changes in emotions or mood -constipation -diarrhea -excessive hair growth on the face  or body -headache -nausea, vomiting -pain, redness, or irritation at site where injected -trouble sleeping -weight gain This list may not describe all possible side effects. Call your doctor for medical advice about side effects. You may report side effects to FDA at 1-800-FDA-1088. Where should I keep my medicine? This drug is given in a hospital or clinic and will not be stored at home. NOTE: This sheet is a summary. It may not cover all possible information. If you have questions about this medicine, talk to your doctor, pharmacist, or health care provider.  2019 [employer] Standard (2015-10-05 16:17:02)

## 2018-12-31 NOTE — Progress Notes (Signed)
Hematology and Oncology Follow Up Visit  Christian Burns [id number] 06-29-1930 85 y.o. 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 Palliative radiation therapy to right ilium Kyprolis/Cytoxanq 3 week dosing- s/p cycle 24 (Cytoxan restarted on cycle 13) - DC'd due to progression Radiation therapy to right femur, 10 fraction --completed 3000 rad on 11/06/2018 Pomalidomide 2 mg po q day (21 on/7 off) - started 10/29/2018 Elotuzomab - started 10/29/2018 -- s/p cycle 1 -- d/c on 12/24/2018  Current Therapy:   Daratumumab - started cycle 1 on 12/31/2018 Zometa IV q 4 weeks - on hold Aranesp 300 g subcutaneous as needed for hemoglobin less than 10   Interim History:  Christian Burns is here today for follow-up and day 1 of cycle 1 of Daratumumab.  He states that he does not have much of an appetite. He is drinking a carnation instant breakfast shake each morning. His weight is down 3 lbs.  His M-spike last week was 1.8, IgG level 2,676 mg/dL and lambda light chains 104.97 mg/dL.  No fever, chills, n/v, cough, rash, dizziness, SOB, chest pain, palpitations, abdominal pain or changes in bowel or bladder habits.  No swelling in his extremities at this time.  The neuropathy in his hands and feet is stable and unchanged.  No lymphadenopathy noted on exam.  He is ambulating with a rolling walker for support. No falls or syncopal episodes to report.   ECOG Performance Status: 1 - Symptomatic but completely ambulatory  Medications:  Allergies as of 12/31/2018      Reactions   Sulfa Antibiotics Itching   Sulfasalazine Itching      Medication List       Accurate as of Dec 31, 2018  8:13 AM. If you have any questions,  ask your nurse or doctor.        amLODipine-benazepril 5-10 MG capsule Commonly known as:  LOTREL TAKE ONE CAPSULE EACH DAY   finasteride 5 MG tablet Commonly known as:  PROSCAR Take 5 mg by mouth daily.   montelukast 10 MG tablet Commonly known as:  Singulair Take 1 tablet (10 mg total) by mouth at bedtime.   multivitamin with minerals Tabs tablet Take 1 tablet by mouth daily.   penicillin v potassium 500 MG tablet Commonly known as:  VEETID Take 1 tablet (500 mg total) by mouth 4 (four) times daily.   pomalidomide 2 MG capsule Commonly known as:  POMALYST Take 1 capsule (2 mg total) by mouth daily. Take with water on days 1-21. Repeat every 28 days. [id number]   PROBIOTIC PO Take 1 capsule by mouth daily.   pyridOXINE 100 MG tablet Commonly known as:  VITAMIN B-6 Take 100 mg by mouth daily.   traMADol 50 MG tablet Commonly known as:  ULTRAM Take 1 tablet (50 mg total) by mouth every 8 (eight) hours as needed.   vitamin B-12 500 MCG tablet Commonly known as:  CYANOCOBALAMIN Take 500 mcg by mouth daily.   Vitamin D (Ergocalciferol) 1.25 MG (50000 UT) Caps capsule Commonly known as:  DRISDOL Take 50,000 Units by mouth every Sunday.       Allergies:  Allergies  Allergen Reactions  . Sulfa Antibiotics Itching  . Sulfasalazine Itching    Past Medical History, Surgical  history, Social history, and Family History were reviewed and updated.  Review of Systems: All other 10 point review of systems is negative.   Physical Exam:  vitals were not taken for this visit.   Wt Readings from Last 3 Encounters:  12/24/18 144 lb (65.3 kg)  11/26/18 140 lb (63.5 kg)  11/26/18 142 lb (64.4 kg)    Ocular: Sclerae unicteric, pupils equal, round and reactive to light Ear-nose-throat: Oropharynx clear, dentition fair Lymphatic: No cervical or supraclavicular adenopathy Lungs no rales or rhonchi, good excursion bilaterally Heart regular rate and rhythm, no murmur  appreciated Abd soft, nontender, positive bowel sounds, no liver or spleen tip palpated on exam, no fluid wave  MSK no focal spinal tenderness, no joint edema Neuro: non-focal, well-oriented, appropriate affect Breasts: Deferred   Lab Results  Component Value Date   WBC 4.8 12/24/2018   HGB 10.4 (L) 12/24/2018   HCT 32.4 (L) 12/24/2018   MCV 90.3 12/24/2018   PLT 183 12/24/2018   Lab Results  Component Value Date   FERRITIN 125 12/10/2018   IRON 40 (L) 12/10/2018   TIBC 220 12/10/2018   UIBC 180 12/10/2018   IRONPCTSAT 18 (L) 12/10/2018   Lab Results  Component Value Date   RETICCTPCT 1.3 12/10/2018   RBC 3.59 (L) 12/24/2018   Lab Results  Component Value Date   KPAFRELGTCHN 34.0 (H) 12/24/2018   LAMBDASER 1,049.7 (H) 12/24/2018   KAPLAMBRATIO 0.03 (L) 12/24/2018   Lab Results  Component Value Date   IGGSERUM 2,676 (H) 12/24/2018   IGGSERUM 2,711 (H) 12/24/2018   IGA 59 (L) 12/24/2018   IGA 63 12/24/2018   IGMSERUM 30 12/24/2018   IGMSERUM 33 12/24/2018   Lab Results  Component Value Date   TOTALPROTELP 6.8 12/24/2018   ALBUMINELP 3.0 12/24/2018   A1GS 0.3 12/24/2018   A2GS 0.8 12/24/2018   BETS 0.7 12/24/2018   BETA2SER 0.3 07/28/2015   GAMS 2.0 (H) 12/24/2018   MSPIKE 1.8 (H) 12/24/2018   SPEI Comment 11/26/2018     Chemistry      Component Value Date/Time   NA 137 12/24/2018 1213   NA 146 (H) 08/07/2017 1153   NA 141 01/09/2017 1004   K 4.6 12/24/2018 1213   K 4.6 08/07/2017 1153   K 4.4 01/09/2017 1004   CL 108 12/24/2018 1213   CL 108 08/07/2017 1153   CO2 24 12/24/2018 1213   CO2 26 08/07/2017 1153   CO2 22 01/09/2017 1004   BUN 29 (H) 12/24/2018 1213   BUN 24 (H) 08/07/2017 1153   BUN 23.9 01/09/2017 1004   CREATININE 1.42 (H) 12/24/2018 1213   CREATININE 1.56 (H) 05/26/2018 1508   CREATININE 1.5 (H) 01/09/2017 1004      Component Value Date/Time   CALCIUM 9.0 12/24/2018 1213   CALCIUM 8.8 08/07/2017 1153   CALCIUM 8.8 01/09/2017  1004   ALKPHOS 80 12/24/2018 1213   ALKPHOS 97 (H) 08/07/2017 1153   ALKPHOS 80 01/09/2017 1004   AST 16 12/24/2018 1213   AST 18 01/09/2017 1004   ALT 13 12/24/2018 1213   ALT 20 08/07/2017 1153   ALT 12 01/09/2017 1004   BILITOT 0.7 12/24/2018 1213   BILITOT 0.92 01/09/2017 1004       Impression and Plan: Christian Burns is a very pleasant 85 yo caucasian gentleman with relapsed IgG lambda myeloma.  We will proceed with day 1 of cycle 1 of Daratumumab.  His prescriptions for Zovirax, decadron, ativan and compazine  were sent to his pharmacy and will be delivered to his home later today.  He will also try taking Marinol 5 mg PO BID to stimulate his appetite and hopefully facilitate weight gain.  We will plan to see him back in another week for follow-up.  He states that he is doing ok caring for his wife right now and does not need any extra assistance at home at this time.  He will contact our office with any questions or concerns. We can certainly see him sooner if need be.   Laverna Peace, NP 5/21/20208:13 AM

## 2019-01-01 ENCOUNTER — Other Ambulatory Visit: Payer: Self-pay | Admitting: Hematology & Oncology

## 2019-01-01 DIAGNOSIS — R262 Difficulty in walking, not elsewhere classified: Secondary | ICD-10-CM | POA: Diagnosis not present

## 2019-01-01 DIAGNOSIS — M6281 Muscle weakness (generalized): Secondary | ICD-10-CM | POA: Diagnosis not present

## 2019-01-01 DIAGNOSIS — I10 Essential (primary) hypertension: Secondary | ICD-10-CM

## 2019-01-01 DIAGNOSIS — Z4789 Encounter for other orthopedic aftercare: Secondary | ICD-10-CM | POA: Diagnosis not present

## 2019-01-01 DIAGNOSIS — R2681 Unsteadiness on feet: Secondary | ICD-10-CM | POA: Diagnosis not present

## 2019-01-07 ENCOUNTER — Ambulatory Visit: Payer: Medicare HMO | Admitting: Hematology & Oncology

## 2019-01-07 ENCOUNTER — Other Ambulatory Visit: Payer: Medicare HMO

## 2019-01-07 ENCOUNTER — Ambulatory Visit: Payer: Medicare HMO

## 2019-01-08 ENCOUNTER — Other Ambulatory Visit: Payer: Self-pay

## 2019-01-08 ENCOUNTER — Encounter: Payer: Self-pay | Admitting: Hematology & Oncology

## 2019-01-08 ENCOUNTER — Inpatient Hospital Stay (HOSPITAL_BASED_OUTPATIENT_CLINIC_OR_DEPARTMENT_OTHER): Payer: Medicare HMO | Admitting: Hematology & Oncology

## 2019-01-08 ENCOUNTER — Inpatient Hospital Stay: Payer: Medicare HMO

## 2019-01-08 ENCOUNTER — Other Ambulatory Visit: Payer: Medicare HMO

## 2019-01-08 VITALS — BP 132/71 | HR 78 | Temp 97.9°F | Ht 70.0 in | Wt 139.1 lb

## 2019-01-08 VITALS — BP 142/77 | HR 70 | Temp 97.6°F | Resp 18

## 2019-01-08 DIAGNOSIS — G629 Polyneuropathy, unspecified: Secondary | ICD-10-CM

## 2019-01-08 DIAGNOSIS — D649 Anemia, unspecified: Secondary | ICD-10-CM

## 2019-01-08 DIAGNOSIS — R300 Dysuria: Secondary | ICD-10-CM | POA: Diagnosis not present

## 2019-01-08 DIAGNOSIS — N289 Disorder of kidney and ureter, unspecified: Secondary | ICD-10-CM

## 2019-01-08 DIAGNOSIS — K5900 Constipation, unspecified: Secondary | ICD-10-CM | POA: Diagnosis not present

## 2019-01-08 DIAGNOSIS — M2550 Pain in unspecified joint: Secondary | ICD-10-CM | POA: Diagnosis not present

## 2019-01-08 DIAGNOSIS — C9002 Multiple myeloma in relapse: Secondary | ICD-10-CM | POA: Diagnosis not present

## 2019-01-08 DIAGNOSIS — R634 Abnormal weight loss: Secondary | ICD-10-CM

## 2019-01-08 DIAGNOSIS — Z923 Personal history of irradiation: Secondary | ICD-10-CM

## 2019-01-08 DIAGNOSIS — R5383 Other fatigue: Secondary | ICD-10-CM

## 2019-01-08 DIAGNOSIS — M791 Myalgia, unspecified site: Secondary | ICD-10-CM

## 2019-01-08 DIAGNOSIS — Z79899 Other long term (current) drug therapy: Secondary | ICD-10-CM

## 2019-01-08 DIAGNOSIS — Z5112 Encounter for antineoplastic immunotherapy: Secondary | ICD-10-CM | POA: Diagnosis not present

## 2019-01-08 LAB — CMP (CANCER CENTER ONLY)
ALT: 10 U/L (ref 0–44)
AST: 16 U/L (ref 15–41)
Albumin: 3.2 g/dL — ABNORMAL LOW (ref 3.5–5.0)
Alkaline Phosphatase: 71 U/L (ref 38–126)
Anion gap: 5 (ref 5–15)
BUN: 29 mg/dL — ABNORMAL HIGH (ref 8–23)
CO2: 24 mmol/L (ref 22–32)
Calcium: 9.3 mg/dL (ref 8.9–10.3)
Chloride: 106 mmol/L (ref 98–111)
Creatinine: 1.5 mg/dL — ABNORMAL HIGH (ref 0.61–1.24)
GFR, Est AFR Am: 48 mL/min — ABNORMAL LOW (ref >60)
GFR, Estimated: 42 mL/min — ABNORMAL LOW (ref >60)
Glucose, Bld: 142 mg/dL — ABNORMAL HIGH (ref 70–99)
Potassium: 4.5 mmol/L (ref 3.5–5.1)
Sodium: 135 mmol/L (ref 135–145)
Total Bilirubin: 0.6 mg/dL (ref 0.3–1.2)
Total Protein: 8 g/dL (ref 6.5–8.1)

## 2019-01-08 LAB — CBC WITH DIFFERENTIAL (CANCER CENTER ONLY)
Abs Immature Granulocytes: 0.02 10*3/uL (ref 0.00–0.07)
Basophils Absolute: 0.1 10*3/uL (ref 0.0–0.1)
Basophils Relative: 2 %
Eosinophils Absolute: 0.3 10*3/uL (ref 0.0–0.5)
Eosinophils Relative: 7 %
HCT: 33.9 % — ABNORMAL LOW (ref 39.0–52.0)
Hemoglobin: 10.8 g/dL — ABNORMAL LOW (ref 13.0–17.0)
Immature Granulocytes: 0 %
Lymphocytes Relative: 5 %
Lymphs Abs: 0.3 10*3/uL — ABNORMAL LOW (ref 0.7–4.0)
MCH: 28.4 pg (ref 26.0–34.0)
MCHC: 31.9 g/dL (ref 30.0–36.0)
MCV: 89.2 fL (ref 80.0–100.0)
Monocytes Absolute: 0.6 10*3/uL (ref 0.1–1.0)
Monocytes Relative: 12 %
Neutro Abs: 3.5 10*3/uL (ref 1.7–7.7)
Neutrophils Relative %: 74 %
Platelet Count: 216 10*3/uL (ref 150–400)
RBC: 3.8 MIL/uL — ABNORMAL LOW (ref 4.22–5.81)
RDW: 15.9 % — ABNORMAL HIGH (ref 11.5–15.5)
WBC Count: 4.8 10*3/uL (ref 4.0–10.5)
nRBC: 0 % (ref 0.0–0.2)

## 2019-01-08 MED ORDER — SODIUM CHLORIDE 0.9 % IV SOLN
1000.0000 mg | Freq: Once | INTRAVENOUS | Status: AC
  Administered 2019-01-08: 1000 mg via INTRAVENOUS
  Filled 2019-01-08: qty 40

## 2019-01-08 MED ORDER — METHYLPREDNISOLONE SODIUM SUCC 125 MG IJ SOLR
INTRAMUSCULAR | Status: AC
  Filled 2019-01-08: qty 2

## 2019-01-08 MED ORDER — DIPHENHYDRAMINE HCL 25 MG PO CAPS
50.0000 mg | ORAL_CAPSULE | Freq: Once | ORAL | Status: AC
  Administered 2019-01-08: 50 mg via ORAL

## 2019-01-08 MED ORDER — ACETAMINOPHEN 325 MG PO TABS
ORAL_TABLET | ORAL | Status: AC
  Filled 2019-01-08: qty 2

## 2019-01-08 MED ORDER — MEGESTROL ACETATE 400 MG/10ML PO SUSP: 600.0000 mg | Freq: Every day | ORAL | 5 refills | Status: DC

## 2019-01-08 MED ORDER — SODIUM CHLORIDE 0.9 % IV SOLN
INTRAVENOUS | Status: DC
  Administered 2019-01-08: 09:00:00 via INTRAVENOUS
  Filled 2019-01-08: qty 250

## 2019-01-08 MED ORDER — ACETAMINOPHEN 325 MG PO TABS
650.0000 mg | ORAL_TABLET | Freq: Once | ORAL | Status: AC
  Administered 2019-01-08: 650 mg via ORAL

## 2019-01-08 MED ORDER — METHYLPREDNISOLONE SODIUM SUCC 125 MG IJ SOLR
100.0000 mg | Freq: Once | INTRAMUSCULAR | Status: AC
  Administered 2019-01-08: 100 mg via INTRAVENOUS

## 2019-01-08 MED ORDER — DIPHENHYDRAMINE HCL 25 MG PO CAPS
ORAL_CAPSULE | ORAL | Status: AC
  Filled 2019-01-08: qty 2

## 2019-01-08 NOTE — Patient Instructions (Signed)
[facility] Discharge Instructions for Patients Receiving Chemotherapy  Today you received the following chemotherapy agents:  Darzalex  To help prevent nausea and vomiting after your treatment, we encourage you to take your nausea medication as prescribed.   If you develop nausea and vomiting that is not controlled by your nausea medication, call the clinic.   BELOW ARE SYMPTOMS THAT SHOULD BE REPORTED IMMEDIATELY:  *FEVER GREATER THAN 100.5 F  *CHILLS WITH OR WITHOUT FEVER  NAUSEA AND VOMITING THAT IS NOT CONTROLLED WITH YOUR NAUSEA MEDICATION  *UNUSUAL SHORTNESS OF BREATH  *UNUSUAL BRUISING OR BLEEDING  TENDERNESS IN MOUTH AND THROAT WITH OR WITHOUT PRESENCE OF ULCERS  *URINARY PROBLEMS  *BOWEL PROBLEMS  UNUSUAL RASH Items with * indicate a potential emergency and should be followed up as soon as possible.  Feel free to call the clinic should you have any questions or concerns. The clinic phone number is [phone].  Please show the CHEMO ALERT CARD at check-in to the Emergency Department and triage nurse.   

## 2019-01-08 NOTE — Addendum Note (Signed)
Addended by: Burney Gauze R on: 01/08/2019 03:25 PM   Modules accepted: Orders

## 2019-01-08 NOTE — Progress Notes (Signed)
Hematology and Oncology Follow Up Visit  Christian Burns [id number] August 09, 1930 85 y.o. 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  I am worried about his weight loss.  He is on Marinol.  Sounds like he may need to add some Megace to try to help his appetite a little bit better.  So far, he is tolerated the daratumumab well.  He has had his first cycle already.  He does have some fatigue.  He does have a little bit of constipation.  He does have neuropathy which is about the same.  He still wants his quality of life to be the primary goal.  I definitely understand this.  He still help his poor wife who is crippled with arthritis.  Overall, his performance status is ECOG 2.  Medications:  Allergies as of 01/08/2019      Reactions   Sulfa Antibiotics Itching   Sulfasalazine Itching      Medication List       Accurate as of Jan 08, 2019  8:55 AM. If you have any questions, ask your nurse or doctor.        acyclovir 400 MG tablet Commonly known as:  ZOVIRAX Take 1 tablet (400 mg total) by mouth 2 (two) times daily.    amLODipine-benazepril 5-10 MG capsule Commonly known as:  LOTREL TAKE ONE CAPSULE EACH DAY   dexamethasone 4 MG tablet Commonly known as:  DECADRON Take 1 tablet (4 mg total) by mouth daily. Take for 2 days starting the night of chemotherapy.   dronabinol 5 MG capsule Commonly known as:  MARINOL Take 1 capsule (5 mg total) by mouth 2 (two) times daily before a meal.   finasteride 5 MG tablet Commonly known as:  PROSCAR Take 5 mg by mouth daily.   LORazepam 0.5 MG tablet Commonly known as:  Ativan Take 1 tablet (0.5 mg total) by mouth every 6 (six) hours as needed (Nausea or vomiting).   montelukast 10 MG tablet Commonly known as:  Singulair Take 1 tablet (10 mg total) by mouth at bedtime.   multivitamin with minerals Tabs tablet Take 1 tablet by mouth daily.   penicillin v potassium 500 MG tablet Commonly known as:  VEETID Take 1 tablet (500 mg total) by mouth 4 (four) times daily.   PROBIOTIC PO Take 1 capsule by mouth daily.   prochlorperazine 10 MG tablet Commonly known as:  COMPAZINE Take 1 tablet (10 mg total) by mouth every 6 (six) hours as needed (Nausea or vomiting).   pyridOXINE 100 MG tablet Commonly known as:  VITAMIN B-6 Take 100 mg by mouth daily.   traMADol 50 MG tablet  Commonly known as:  ULTRAM Take 1 tablet (50 mg total) by mouth every 8 (eight) hours as needed.   vitamin B-12 500 MCG tablet Commonly known as:  CYANOCOBALAMIN Take 500 mcg by mouth daily.   Vitamin D (Ergocalciferol) 1.25 MG (50000 UT) Caps capsule Commonly known as:  DRISDOL Take 50,000 Units by mouth every Sunday.       Allergies:  Allergies  Allergen Reactions  . Sulfa Antibiotics Itching  . Sulfasalazine Itching    Past Medical History, Surgical history, Social history, and Family History were reviewed and updated.  Review of Systems: Review of Systems  Constitutional: Positive for weight loss.  HENT: Negative.   Eyes: Negative.   Respiratory: Negative.    Cardiovascular: Negative.   Gastrointestinal: Negative.   Genitourinary: Positive for dysuria.  Musculoskeletal: Positive for joint pain and myalgias.  Skin: Negative.   Neurological: Negative.   Endo/Heme/Allergies: Negative.   Psychiatric/Behavioral: Negative.      Physical Exam:  height is 5\' 10"  (1.778 m) and weight is 139 lb 1.9 oz (63.1 kg). His oral temperature is 97.9 F (36.6 C). His blood pressure is 132/71 and his pulse is 78. His oxygen saturation is 98%.   Wt Readings from Last 3 Encounters:  01/08/19 139 lb 1.9 oz (63.1 kg)  12/31/18 141 lb 12 oz (64.3 kg)  12/24/18 144 lb (65.3 kg)    Physical Exam Vitals signs reviewed.  HENT:     Head: Normocephalic and atraumatic.  Eyes:     Pupils: Pupils are equal, round, and reactive to light.  Neck:     Musculoskeletal: Normal range of motion.  Cardiovascular:     Rate and Rhythm: Normal rate and regular rhythm.     Heart sounds: Normal heart sounds.  Pulmonary:     Effort: Pulmonary effort is normal.     Breath sounds: Normal breath sounds.  Abdominal:     General: Bowel sounds are normal.     Palpations: Abdomen is soft.  Musculoskeletal: Normal range of motion.        General: No tenderness or deformity.  Lymphadenopathy:     Cervical: No cervical adenopathy.  Skin:    General: Skin is warm and dry.     Findings: No erythema or rash.  Neurological:     Mental Status: He is alert and oriented to person, place, and time.  Psychiatric:        Behavior: Behavior normal.        Thought Content: Thought content normal.        Judgment: Judgment normal.      Lab Results  Component Value Date   WBC 4.8 01/08/2019   HGB 10.8 (L) 01/08/2019   HCT 33.9 (L) 01/08/2019   MCV 89.2 01/08/2019   PLT 216 01/08/2019   Lab Results  Component Value Date   FERRITIN 125 12/10/2018   IRON 40 (L) 12/10/2018   TIBC 220 12/10/2018   UIBC 180 12/10/2018   IRONPCTSAT 18 (L) 12/10/2018   Lab Results  Component  Value Date   RETICCTPCT 1.3 12/10/2018   RBC 3.80 (L) 01/08/2019   Lab Results  Component Value Date   KPAFRELGTCHN 34.0 (H) 12/24/2018   LAMBDASER 1,049.7 (H) 12/24/2018   KAPLAMBRATIO 0.03 (L) 12/24/2018   Lab Results  Component Value Date   IGGSERUM 2,676 (H) 12/24/2018   IGGSERUM 2,711 (H) 12/24/2018   IGA 59 (L) 12/24/2018   IGA 63 12/24/2018   IGMSERUM 30 12/24/2018   IGMSERUM  33 12/24/2018   Lab Results  Component Value Date   TOTALPROTELP 6.8 12/24/2018   ALBUMINELP 3.0 12/24/2018   A1GS 0.3 12/24/2018   A2GS 0.8 12/24/2018   BETS 0.7 12/24/2018   BETA2SER 0.3 07/28/2015   GAMS 2.0 (H) 12/24/2018   MSPIKE 1.8 (H) 12/24/2018   SPEI Comment 11/26/2018     Chemistry      Component Value Date/Time   NA 135 12/31/2018 0840   NA 146 (H) 08/07/2017 1153   NA 141 01/09/2017 1004   K 4.3 12/31/2018 0840   K 4.6 08/07/2017 1153   K 4.4 01/09/2017 1004   CL 107 12/31/2018 0840   CL 108 08/07/2017 1153   CO2 23 12/31/2018 0840   CO2 26 08/07/2017 1153   CO2 22 01/09/2017 1004   BUN 30 (H) 12/31/2018 0840   BUN 24 (H) 08/07/2017 1153   BUN 23.9 01/09/2017 1004   CREATININE 1.53 (H) 12/31/2018 0840   CREATININE 1.56 (H) 05/26/2018 1508   CREATININE 1.5 (H) 01/09/2017 1004      Component Value Date/Time   CALCIUM 9.3 12/31/2018 0840   CALCIUM 8.8 08/07/2017 1153   CALCIUM 8.8 01/09/2017 1004   ALKPHOS 80 12/31/2018 0840   ALKPHOS 97 (H) 08/07/2017 1153   ALKPHOS 80 01/09/2017 1004   AST 16 12/31/2018 0840   AST 18 01/09/2017 1004   ALT 11 12/31/2018 0840   ALT 20 08/07/2017 1153   ALT 12 01/09/2017 1004   BILITOT 0.7 12/31/2018 0840   BILITOT 0.92 01/09/2017 1004       Impression and Plan: Christian Burns is a very pleasant 85 yo caucasian gentleman with relapsed IgG lambda myeloma.   His focus of therapy has always been taking care of his wife.  He wants to be able to help her.  She is crippled by arthritis.  We will continue daratumumab.  Hopefully we  will see a response in another 2 or 3 cycles of treatment.  I am just very impressed with his resilience.  Christian Burns has had a myeloma for easily over 10 years.  He is done remarkably well today and hopefully we will continue to see him improve and see his numbers get better.  I think that obviously   [name], MD 5/29/20208:55 AM

## 2019-01-11 DIAGNOSIS — R262 Difficulty in walking, not elsewhere classified: Secondary | ICD-10-CM | POA: Diagnosis not present

## 2019-01-11 DIAGNOSIS — M6281 Muscle weakness (generalized): Secondary | ICD-10-CM | POA: Diagnosis not present

## 2019-01-11 DIAGNOSIS — Z4789 Encounter for other orthopedic aftercare: Secondary | ICD-10-CM | POA: Diagnosis not present

## 2019-01-11 DIAGNOSIS — R2681 Unsteadiness on feet: Secondary | ICD-10-CM | POA: Diagnosis not present

## 2019-01-13 ENCOUNTER — Other Ambulatory Visit: Payer: Self-pay

## 2019-01-13 DIAGNOSIS — Z4789 Encounter for other orthopedic aftercare: Secondary | ICD-10-CM | POA: Diagnosis not present

## 2019-01-13 DIAGNOSIS — C9002 Multiple myeloma in relapse: Secondary | ICD-10-CM

## 2019-01-13 DIAGNOSIS — R262 Difficulty in walking, not elsewhere classified: Secondary | ICD-10-CM | POA: Diagnosis not present

## 2019-01-13 DIAGNOSIS — R2681 Unsteadiness on feet: Secondary | ICD-10-CM | POA: Diagnosis not present

## 2019-01-13 DIAGNOSIS — M6281 Muscle weakness (generalized): Secondary | ICD-10-CM | POA: Diagnosis not present

## 2019-01-14 ENCOUNTER — Inpatient Hospital Stay: Payer: Medicare HMO

## 2019-01-14 ENCOUNTER — Inpatient Hospital Stay: Payer: Medicare HMO | Attending: Hematology & Oncology

## 2019-01-14 ENCOUNTER — Other Ambulatory Visit: Payer: Self-pay

## 2019-01-14 VITALS — BP 133/72 | HR 77 | Temp 97.5°F | Resp 16

## 2019-01-14 DIAGNOSIS — C9002 Multiple myeloma in relapse: Secondary | ICD-10-CM | POA: Diagnosis present

## 2019-01-14 DIAGNOSIS — D649 Anemia, unspecified: Secondary | ICD-10-CM | POA: Diagnosis not present

## 2019-01-14 DIAGNOSIS — Z923 Personal history of irradiation: Secondary | ICD-10-CM | POA: Insufficient documentation

## 2019-01-14 DIAGNOSIS — Z5112 Encounter for antineoplastic immunotherapy: Secondary | ICD-10-CM | POA: Insufficient documentation

## 2019-01-14 DIAGNOSIS — Z79899 Other long term (current) drug therapy: Secondary | ICD-10-CM | POA: Diagnosis not present

## 2019-01-14 DIAGNOSIS — N289 Disorder of kidney and ureter, unspecified: Secondary | ICD-10-CM | POA: Diagnosis not present

## 2019-01-14 LAB — CBC WITH DIFFERENTIAL (CANCER CENTER ONLY)
Abs Immature Granulocytes: 0.03 10*3/uL (ref 0.00–0.07)
Basophils Absolute: 0.1 10*3/uL (ref 0.0–0.1)
Basophils Relative: 1 %
Eosinophils Absolute: 0.4 10*3/uL (ref 0.0–0.5)
Eosinophils Relative: 6 %
HCT: 31.6 % — ABNORMAL LOW (ref 39.0–52.0)
Hemoglobin: 10.2 g/dL — ABNORMAL LOW (ref 13.0–17.0)
Immature Granulocytes: 0 %
Lymphocytes Relative: 3 %
Lymphs Abs: 0.3 10*3/uL — ABNORMAL LOW (ref 0.7–4.0)
MCH: 28.9 pg (ref 26.0–34.0)
MCHC: 32.3 g/dL (ref 30.0–36.0)
MCV: 89.5 fL (ref 80.0–100.0)
Monocytes Absolute: 0.9 10*3/uL (ref 0.1–1.0)
Monocytes Relative: 13 %
Neutro Abs: 5.6 10*3/uL (ref 1.7–7.7)
Neutrophils Relative %: 77 %
Platelet Count: 166 10*3/uL (ref 150–400)
RBC: 3.53 MIL/uL — ABNORMAL LOW (ref 4.22–5.81)
RDW: 16.2 % — ABNORMAL HIGH (ref 11.5–15.5)
WBC Count: 7.3 10*3/uL (ref 4.0–10.5)
nRBC: 0 % (ref 0.0–0.2)

## 2019-01-14 LAB — CMP (CANCER CENTER ONLY)
ALT: 11 U/L (ref 0–44)
AST: 15 U/L (ref 15–41)
Albumin: 3.1 g/dL — ABNORMAL LOW (ref 3.5–5.0)
Alkaline Phosphatase: 79 U/L (ref 38–126)
Anion gap: 6 (ref 5–15)
BUN: 35 mg/dL — ABNORMAL HIGH (ref 8–23)
CO2: 23 mmol/L (ref 22–32)
Calcium: 8.6 mg/dL — ABNORMAL LOW (ref 8.9–10.3)
Chloride: 106 mmol/L (ref 98–111)
Creatinine: 1.67 mg/dL — ABNORMAL HIGH (ref 0.61–1.24)
GFR, Est AFR Am: 42 mL/min — ABNORMAL LOW (ref >60)
GFR, Estimated: 36 mL/min — ABNORMAL LOW (ref >60)
Glucose, Bld: 141 mg/dL — ABNORMAL HIGH (ref 70–99)
Potassium: 4.3 mmol/L (ref 3.5–5.1)
Sodium: 135 mmol/L (ref 135–145)
Total Bilirubin: 0.5 mg/dL (ref 0.3–1.2)
Total Protein: 7.7 g/dL (ref 6.5–8.1)

## 2019-01-14 MED ORDER — DIPHENHYDRAMINE HCL 25 MG PO CAPS
ORAL_CAPSULE | ORAL | Status: AC
  Filled 2019-01-14: qty 2

## 2019-01-14 MED ORDER — DIPHENHYDRAMINE HCL 25 MG PO CAPS
50.0000 mg | ORAL_CAPSULE | Freq: Once | ORAL | Status: AC
  Administered 2019-01-14: 50 mg via ORAL

## 2019-01-14 MED ORDER — METHYLPREDNISOLONE SODIUM SUCC 125 MG IJ SOLR
100.0000 mg | Freq: Once | INTRAMUSCULAR | Status: AC
  Administered 2019-01-14: 100 mg via INTRAVENOUS

## 2019-01-14 MED ORDER — SODIUM CHLORIDE 0.9 % IV SOLN
15.4000 mg/kg | Freq: Once | INTRAVENOUS | Status: AC
  Administered 2019-01-14: 1000 mg via INTRAVENOUS
  Filled 2019-01-14: qty 40

## 2019-01-14 MED ORDER — METHYLPREDNISOLONE SODIUM SUCC 125 MG IJ SOLR
INTRAMUSCULAR | Status: AC
  Filled 2019-01-14: qty 2

## 2019-01-14 MED ORDER — ACETAMINOPHEN 325 MG PO TABS
650.0000 mg | ORAL_TABLET | Freq: Once | ORAL | Status: AC
  Administered 2019-01-14: 650 mg via ORAL

## 2019-01-14 MED ORDER — SODIUM CHLORIDE 0.9 % IV SOLN
INTRAVENOUS | Status: DC
  Administered 2019-01-14: 09:00:00 via INTRAVENOUS
  Filled 2019-01-14: qty 250

## 2019-01-14 MED ORDER — ACETAMINOPHEN 325 MG PO TABS
ORAL_TABLET | ORAL | Status: AC
  Filled 2019-01-14: qty 2

## 2019-01-14 NOTE — Patient Instructions (Signed)
Daratumumab injection What is this medicine? DARATUMUMAB (dar a toom ue mab) is a monoclonal antibody. It is used to treat multiple myeloma. This medicine may be used for other purposes; ask your health care provider or pharmacist if you have questions. COMMON BRAND NAME(S): DARZALEX What should I tell my health care provider before I take this medicine? They need to know if you have any of these conditions: -infection (especially a virus infection such as chickenpox, herpes, or hepatitis B virus) -lung or breathing disease -an unusual or allergic reaction to daratumumab, other medicines, foods, dyes, or preservatives -pregnant or trying to get pregnant -breast-feeding How should I use this medicine? This medicine is for infusion into a vein. It is given by a health care professional in a hospital or clinic setting. Talk to your pediatrician regarding the use of this medicine in children. Special care may be needed. Overdosage: If you think you have taken too much of this medicine contact a poison control center or emergency room at once. NOTE: This medicine is only for you. Do not share this medicine with others. What if I miss a dose? Keep appointments for follow-up doses as directed. It is important not to miss your dose. Call your doctor or health care professional if you are unable to keep an appointment. What may interact with this medicine? Interactions have not been studied. Give your health care provider a list of all the medicines, herbs, non-prescription drugs, or dietary supplements you use. Also tell them if you smoke, drink alcohol, or use illegal drugs. Some items may interact with your medicine. This list may not describe all possible interactions. Give your health care provider a list of all the medicines, herbs, non-prescription drugs, or dietary supplements you use. Also tell them if you smoke, drink alcohol, or use illegal drugs. Some items may interact with your  medicine. What should I watch for while using this medicine? This drug may make you feel generally unwell. Report any side effects. Continue your course of treatment even though you feel ill unless your doctor tells you to stop. This medicine can cause serious allergic reactions. To reduce your risk you may need to take medicine before treatment with this medicine. Take your medicine as directed. This medicine can affect the results of blood tests to match your blood type. These changes can last for up to 6 months after the final dose. Your healthcare provider will do blood tests to match your blood type before you start treatment. Tell all of your healthcare providers that you are being treated with this medicine before receiving a blood transfusion. This medicine can affect the results of some tests used to determine treatment response; extra tests may be needed to evaluate response. Do not become pregnant while taking this medicine or for 3 months after stopping it. Women should inform their doctor if they wish to become pregnant or think they might be pregnant. There is a potential for serious side effects to an unborn child. Talk to your health care professional or pharmacist for more information. What side effects may I notice from receiving this medicine? Side effects that you should report to your doctor or health care professional as soon as possible: -allergic reactions like skin rash, itching or hives, swelling of the face, lips, or tongue -breathing problems -chills -cough -dizziness -feeling faint or lightheaded -headache -low blood counts - this medicine may decrease the number of white blood cells, red blood cells and platelets. You may be at increased   risk for infections and bleeding. -nausea, vomiting -shortness of breath -signs of decreased platelets or bleeding - bruising, pinpoint red spots on the skin, black, tarry stools, blood in the urine -signs of decreased red blood  cells - unusually weak or tired, feeling faint or lightheaded, falls -signs of infection - fever or chills, cough, sore throat, pain or difficulty passing urine -signs and symptoms of liver injury like dark yellow or brown urine; general ill feeling or flu-like symptoms; light-colored stools; loss of appetite; right upper belly pain; unusually weak or tired; yellowing of the eyes or skin Side effects that usually do not require medical attention (report to your doctor or health care professional if they continue or are bothersome): -back pain -constipation -loss of appetite -diarrhea -joint pain -muscle cramps -pain, tingling, numbness in the hands or feet -swelling of the ankles, feet, hands -tiredness -trouble sleeping This list may not describe all possible side effects. Call your doctor for medical advice about side effects. You may report side effects to FDA at 1-800-FDA-1088. Where should I keep my medicine? Keep out of the reach of children. This drug is given in a hospital or clinic and will not be stored at home. NOTE: This sheet is a summary. It may not cover all possible information. If you have questions about this medicine, talk to your doctor, pharmacist, or health care provider.  2019 Elsevier/Gold Standard (2018-02-27 15:52:44) Acetaminophen tablets or caplets What is this medicine? ACETAMINOPHEN (a set a MEE noe fen) is a pain reliever. It is used to treat mild pain and fever. This medicine may be used for other purposes; ask your health care provider or pharmacist if you have questions. COMMON BRAND NAME(S): Aceta, Actamin, Anacin Aspirin Free, Genapap, Genebs, Mapap, Pain & Fever, Pain and Fever, PAIN RELIEF, PAIN RELIEF Extra Strength, Pain Reliever, Panadol, PHARBETOL, Q-Pap, Q-Pap Extra Strength, Tylenol, Tylenol CrushableTablet, Tylenol Extra Strength, XS No Aspirin, XS Pain Reliever What should I tell my health care provider before I take this medicine? They need to  know if you have any of these conditions: -if you often drink alcohol -liver disease -an unusual or allergic reaction to acetaminophen, other medicines, foods, dyes, or preservatives -pregnant or trying to get pregnant -breast-feeding How should I use this medicine? Take this medicine by mouth with a glass of water. Follow the directions on the package or prescription label. Take your medicine at regular intervals. Do not take your medicine more often than directed. Talk to your pediatrician regarding the use of this medicine in children. While this drug may be prescribed for children as young as 30 years of age for selected conditions, precautions do apply. Overdosage: If you think you have taken too much of this medicine contact a poison control center or emergency room at once. NOTE: This medicine is only for you. Do not share this medicine with others. What if I miss a dose? If you miss a dose, take it as soon as you can. If it is almost time for your next dose, take only that dose. Do not take double or extra doses. What may interact with this medicine? -alcohol -imatinib -isoniazid -other medicines with acetaminophen This list may not describe all possible interactions. Give your health care provider a list of all the medicines, herbs, non-prescription drugs, or dietary supplements you use. Also tell them if you smoke, drink alcohol, or use illegal drugs. Some items may interact with your medicine. What should I watch for while using this medicine? Tell  your doctor or health care professional if the pain lasts more than 10 days (5 days for children), if it gets worse, or if there is a new or different kind of pain. Also, check with your doctor if a fever lasts for more than 3 days. Do not take other medicines that contain acetaminophen with this medicine. Always read labels carefully. If you have questions, ask your doctor or pharmacist. If you take too much acetaminophen get medical help  right away. Too much acetaminophen can be very dangerous and cause liver damage. Even if you do not have symptoms, it is important to get help right away. What side effects may I notice from receiving this medicine? Side effects that you should report to your doctor or health care professional as soon as possible: -allergic reactions like skin rash, itching or hives, swelling of the face, lips, or tongue -breathing problems -fever or sore throat -redness, blistering, peeling or loosening of the skin, including inside the mouth -trouble passing urine or change in the amount of urine -unusual bleeding or bruising -unusually weak or tired -yellowing of the eyes or skin Side effects that usually do not require medical attention (report to your doctor or health care professional if they continue or are bothersome): -headache -nausea, stomach upset This list may not describe all possible side effects. Call your doctor for medical advice about side effects. You may report side effects to FDA at 1-800-FDA-1088. Where should I keep my medicine? Keep out of reach of children. Store at room temperature between 20 and 25 degrees C (68 and 77 degrees F). Protect from moisture and heat. Throw away any unused medicine after the expiration date. NOTE: This sheet is a summary. It may not cover all possible information. If you have questions about this medicine, talk to your doctor, pharmacist, or health care provider.  2019 Else[employer]/Gold Standard (2013-03-22 12:54:16) Diphenhydramine capsules or tablets What is this medicine? DIPHENHYDRAMINE (dye fen HYE dra meen) is an antihistamine. It is used to treat the symptoms of an allergic reaction. It is also used to treat Parkinson's disease. This medicine is also used to prevent and to treat motion sickness and as a nighttime sleep aid. This medicine may be used for other purposes; ask your health care provider or pharmacist if you have questions. COMMON BRAND  NAME(S): Alka-Seltzer Plus Allergy, Aller-G-Time, Banophen, Benadryl Allergy, Benadryl Allergy Dye Free, Benadryl Allergy Kapgel, Benadryl Allergy Ultratab, Diphedryl, Diphenhist, Genahist, Geri-Dryl, PHARBEDRYL, Q-Dryl, Gretta Began, Valu-Dryl, Vicks ZzzQuil Nightime Sleep-Aid What should I tell my health care provider before I take this medicine? They need to know if you have any of these conditions: -asthma or lung disease -glaucoma -high blood pressure or heart disease -liver disease -pain or difficulty passing urine -prostate trouble -ulcers or other stomach problems -an unusual or allergic reaction to diphenhydramine, other medicines foods, dyes, or preservatives such as sulfites -pregnant or trying to get pregnant -breast-feeding How should I use this medicine? Take this medicine by mouth with a full glass of water. Follow the directions on the prescription label. Take your doses at regular intervals. Do not take your medicine more often than directed. To prevent motion sickness start taking this medicine 30 to 60 minutes before you leave. Talk to your pediatrician regarding the use of this medicine in children. Special care may be needed. Patients over 46 years old may have a stronger reaction and need a smaller dose. Overdosage: If you think you have taken too much of this medicine contact  a poison control center or emergency room at once. NOTE: This medicine is only for you. Do not share this medicine with others. What if I miss a dose? If you miss a dose, take it as soon as you can. If it is almost time for your next dose, take only that dose. Do not take double or extra doses. What may interact with this medicine? Do not take this medicine with any of the following medications: -MAOIs like Carbex, Eldepryl, Marplan, Nardil, and Parnate This medicine may also interact with the following medications: -alcohol -barbiturates, like phenobarbital -medicines for bladder spasm like  oxybutynin, tolterodine -medicines for blood pressure -medicines for depression, anxiety, or psychotic disturbances -medicines for movement abnormalities or Parkinson's disease -medicines for sleep -other medicines for cold, cough or allergy -some medicines for the stomach like chlordiazepoxide, dicyclomine This list may not describe all possible interactions. Give your health care provider a list of all the medicines, herbs, non-prescription drugs, or dietary supplements you use. Also tell them if you smoke, drink alcohol, or use illegal drugs. Some items may interact with your medicine. What should I watch for while using this medicine? Visit your doctor or health care professional for regular check ups. Tell your doctor if your symptoms do not improve or if they get worse. Your mouth may get dry. Chewing sugarless gum or sucking hard candy, and drinking plenty of water may help. Contact your doctor if the problem does not go away or is severe. This medicine may cause dry eyes and blurred vision. If you wear contact lenses you may feel some discomfort. Lubricating drops may help. See your eye doctor if the problem does not go away or is severe. You may get drowsy or dizzy. Do not drive, use machinery, or do anything that needs mental alertness until you know how this medicine affects you. Do not stand or sit up quickly, especially if you are an older patient. This reduces the risk of dizzy or fainting spells. Alcohol may interfere with the effect of this medicine. Avoid alcoholic drinks. What side effects may I notice from receiving this medicine? Side effects that you should report to your doctor or health care professional as soon as possible: -allergic reactions like skin rash, itching or hives, swelling of the face, lips, or tongue -changes in vision -confused, agitated, nervous -irregular or fast heartbeat -tremor -trouble passing urine -unusual bleeding or bruising -unusually weak or  tired Side effects that usually do not require medical attention (report to your doctor or health care professional if they continue or are bothersome): -constipation, diarrhea -drowsy -headache -loss of appetite -stomach upset, vomiting -thick mucous This list may not describe all possible side effects. Call your doctor for medical advice about side effects. You may report side effects to FDA at 1-800-FDA-1088. Where should I keep my medicine? Keep out of the reach of children. Store at room temperature between 15 and 30 degrees C (59 and 86 degrees F). Keep container closed tightly. Throw away any unused medicine after the expiration date. NOTE: This sheet is a summary. It may not cover all possible information. If you have questions about this medicine, talk to your doctor, pharmacist, or health care provider.  2019 Elsevier/Gold Standard (2007-11-16 17:06:22) Methylprednisolone Solution for Injection What is this medicine? METHYLPREDNISOLONE (meth ill pred NISS oh lone) is a corticosteroid. It is commonly used to treat inflammation of the skin, joints, lungs, and other organs. Common conditions treated include asthma, allergies, and arthritis. It is also  used for other conditions, such as blood disorders and diseases of the adrenal glands. This medicine may be used for other purposes; ask your health care provider or pharmacist if you have questions. COMMON BRAND NAME(S): A-Methapred, Solu-Medrol What should I tell my health care provider before I take this medicine? They need to know if you have any of these conditions: -Cushing's syndrome -eye disease, vision problems -diabetes -glaucoma -heart disease -high blood pressure -infection (especially a virus infection such as chickenpox, cold sores, or herpes) -liver disease -mental illness -myasthenia gravis -osteoporosis -recently received or scheduled to receive a vaccine -seizures -stomach or intestine problems -thyroid  disease -an unusual or allergic reaction to lactose, methylprednisolone, other medicines, foods, dyes, or preservatives -pregnant or trying to get pregnant -breast-feeding How should I use this medicine? This medicine is for injection or infusion into a vein. It is also for injection into a muscle. It is given by a health care professional in a hospital or clinic setting. Talk to your pediatrician regarding the use of this medicine in children. While this drug may be prescribed for selected conditions, precautions do apply. Overdosage: If you think you have taken too much of this medicine contact a poison control center or emergency room at once. NOTE: This medicine is only for you. Do not share this medicine with others. What if I miss a dose? This does not apply. What may interact with this medicine? Do not take this medicine with any of the following medications: -alefacept -echinacea -iopamidol -live virus vaccines -metyrapone -mifepristone This medicine may also interact with the following medications: -amphotericin B -aspirin and aspirin-like medicines -certain antibiotics like erythromycin, clarithromycin, troleandomycin -certain medicines for diabetes -certain medicines for fungal infection like ketoconazole -certain medicines for seizures like carbamazepine, phenobarbital, phenytoin -certain medicines that treat or prevent blood clots like warfarin -cyclosporine -digoxin -diuretics -male hormones, like estrogens and birth control pills -isoniazid -NSAIDS, medicines for pain and inflammation, like ibuprofen or naproxen -other medicines for myasthenia gravis -rifampin -vaccines This list may not describe all possible interactions. Give your health care provider a list of all the medicines, herbs, non-prescription drugs, or dietary supplements you use. Also tell them if you smoke, drink alcohol, or use illegal drugs. Some items may interact with your medicine. What should  I watch for while using this medicine? Tell your doctor or healthcare professional if your symptoms do not start to get better or if they get worse. Do not stop taking except on your doctor's advice. You may develop a severe reaction. Your doctor will tell you how much medicine to take. Your condition will be monitored carefully while you are receiving this medicine. This medicine may increase your risk of getting an infection. Tell your doctor or health care professional if you are around anyone with measles or chickenpox, or if you develop sores or blisters that do not heal properly. This medicine may affect blood sugar levels. If you have diabetes, check with your doctor or health care professional before you change your diet or the dose of your diabetic medicine. Tell your doctor or health care professional right away if you have any change in your eyesight. Using this medicine for a long time may increase your risk of low bone mass. Talk to your doctor about bone health. What side effects may I notice from receiving this medicine? Side effects that you should report to your doctor or health care professional as soon as possible: -allergic reactions like skin rash, itching or hives, swelling  of the face, lips, or tongue -bloody or tarry stools -changes in vision -hallucination, loss of contact with reality -muscle cramps -muscle pain -palpitations -signs and symptoms of high blood sugar such as dizziness; dry mouth; dry skin; fruity breath; nausea; stomach pain; increased hunger or thirst; increased urination -signs and symptoms of infection like fever or chills; cough; sore throat; pain or trouble passing urine -trouble passing urine or change in the amount of urine Side effects that usually do not require medical attention (report to your doctor or health care professional if they continue or are bothersome): -changes in emotions or mood -constipation -diarrhea -excessive hair growth on  the face or body -headache -nausea, vomiting -pain, redness, or irritation at site where injected -trouble sleeping -weight gain This list may not describe all possible side effects. Call your doctor for medical advice about side effects. You may report side effects to FDA at 1-800-FDA-1088. Where should I keep my medicine? This drug is given in a hospital or clinic and will not be stored at home. NOTE: This sheet is a summary. It may not cover all possible information. If you have questions about this medicine, talk to your doctor, pharmacist, or health care provider.  2019 Elsevier/Gold Standard (2015-10-05 16:21:28)

## 2019-01-14 NOTE — Progress Notes (Signed)
Per dr Marin Olp, okay to treat despite labs.

## 2019-01-15 DIAGNOSIS — R262 Difficulty in walking, not elsewhere classified: Secondary | ICD-10-CM | POA: Diagnosis not present

## 2019-01-15 DIAGNOSIS — Z4789 Encounter for other orthopedic aftercare: Secondary | ICD-10-CM | POA: Diagnosis not present

## 2019-01-15 DIAGNOSIS — R2681 Unsteadiness on feet: Secondary | ICD-10-CM | POA: Diagnosis not present

## 2019-01-15 DIAGNOSIS — M6281 Muscle weakness (generalized): Secondary | ICD-10-CM | POA: Diagnosis not present

## 2019-01-18 DIAGNOSIS — M6281 Muscle weakness (generalized): Secondary | ICD-10-CM | POA: Diagnosis not present

## 2019-01-18 DIAGNOSIS — R2681 Unsteadiness on feet: Secondary | ICD-10-CM | POA: Diagnosis not present

## 2019-01-18 DIAGNOSIS — R262 Difficulty in walking, not elsewhere classified: Secondary | ICD-10-CM | POA: Diagnosis not present

## 2019-01-18 DIAGNOSIS — Z4789 Encounter for other orthopedic aftercare: Secondary | ICD-10-CM | POA: Diagnosis not present

## 2019-01-19 ENCOUNTER — Other Ambulatory Visit: Payer: Self-pay | Admitting: Hematology & Oncology

## 2019-01-19 DIAGNOSIS — C9002 Multiple myeloma in relapse: Secondary | ICD-10-CM

## 2019-01-20 ENCOUNTER — Other Ambulatory Visit: Payer: Self-pay | Admitting: Hematology & Oncology

## 2019-01-20 ENCOUNTER — Other Ambulatory Visit: Payer: Self-pay | Admitting: *Deleted

## 2019-01-20 DIAGNOSIS — R262 Difficulty in walking, not elsewhere classified: Secondary | ICD-10-CM | POA: Diagnosis not present

## 2019-01-20 DIAGNOSIS — C9002 Multiple myeloma in relapse: Secondary | ICD-10-CM

## 2019-01-20 DIAGNOSIS — R350 Frequency of micturition: Secondary | ICD-10-CM | POA: Diagnosis not present

## 2019-01-20 DIAGNOSIS — R3121 Asymptomatic microscopic hematuria: Secondary | ICD-10-CM | POA: Diagnosis not present

## 2019-01-20 DIAGNOSIS — Z4789 Encounter for other orthopedic aftercare: Secondary | ICD-10-CM | POA: Diagnosis not present

## 2019-01-20 DIAGNOSIS — M6281 Muscle weakness (generalized): Secondary | ICD-10-CM | POA: Diagnosis not present

## 2019-01-20 DIAGNOSIS — N401 Enlarged prostate with lower urinary tract symptoms: Secondary | ICD-10-CM | POA: Diagnosis not present

## 2019-01-20 DIAGNOSIS — R2681 Unsteadiness on feet: Secondary | ICD-10-CM | POA: Diagnosis not present

## 2019-01-21 ENCOUNTER — Inpatient Hospital Stay: Payer: Medicare HMO

## 2019-01-21 ENCOUNTER — Other Ambulatory Visit: Payer: Self-pay

## 2019-01-21 VITALS — BP 126/61 | HR 75 | Temp 98.1°F | Resp 17

## 2019-01-21 DIAGNOSIS — Z923 Personal history of irradiation: Secondary | ICD-10-CM | POA: Diagnosis not present

## 2019-01-21 DIAGNOSIS — C9002 Multiple myeloma in relapse: Secondary | ICD-10-CM

## 2019-01-21 DIAGNOSIS — D649 Anemia, unspecified: Secondary | ICD-10-CM | POA: Diagnosis not present

## 2019-01-21 DIAGNOSIS — Z5112 Encounter for antineoplastic immunotherapy: Secondary | ICD-10-CM | POA: Diagnosis not present

## 2019-01-21 DIAGNOSIS — Z79899 Other long term (current) drug therapy: Secondary | ICD-10-CM | POA: Diagnosis not present

## 2019-01-21 DIAGNOSIS — N289 Disorder of kidney and ureter, unspecified: Secondary | ICD-10-CM | POA: Diagnosis not present

## 2019-01-21 LAB — CBC WITH DIFFERENTIAL (CANCER CENTER ONLY)
Abs Immature Granulocytes: 0.04 10*3/uL (ref 0.00–0.07)
Basophils Absolute: 0 10*3/uL (ref 0.0–0.1)
Basophils Relative: 0 %
Eosinophils Absolute: 0.6 10*3/uL — ABNORMAL HIGH (ref 0.0–0.5)
Eosinophils Relative: 8 %
HCT: 31.2 % — ABNORMAL LOW (ref 39.0–52.0)
Hemoglobin: 10.1 g/dL — ABNORMAL LOW (ref 13.0–17.0)
Immature Granulocytes: 1 %
Lymphocytes Relative: 5 %
Lymphs Abs: 0.4 10*3/uL — ABNORMAL LOW (ref 0.7–4.0)
MCH: 28.6 pg (ref 26.0–34.0)
MCHC: 32.4 g/dL (ref 30.0–36.0)
MCV: 88.4 fL (ref 80.0–100.0)
Monocytes Absolute: 1 10*3/uL (ref 0.1–1.0)
Monocytes Relative: 13 %
Neutro Abs: 5.4 10*3/uL (ref 1.7–7.7)
Neutrophils Relative %: 73 %
Platelet Count: 167 10*3/uL (ref 150–400)
RBC: 3.53 MIL/uL — ABNORMAL LOW (ref 4.22–5.81)
RDW: 16.5 % — ABNORMAL HIGH (ref 11.5–15.5)
WBC Count: 7.4 10*3/uL (ref 4.0–10.5)
nRBC: 0 % (ref 0.0–0.2)

## 2019-01-21 LAB — CMP (CANCER CENTER ONLY)
ALT: 11 U/L (ref 0–44)
AST: 16 U/L (ref 15–41)
Albumin: 3.2 g/dL — ABNORMAL LOW (ref 3.5–5.0)
Alkaline Phosphatase: 68 U/L (ref 38–126)
Anion gap: 6 (ref 5–15)
BUN: 36 mg/dL — ABNORMAL HIGH (ref 8–23)
CO2: 21 mmol/L — ABNORMAL LOW (ref 22–32)
Calcium: 9.7 mg/dL (ref 8.9–10.3)
Chloride: 108 mmol/L (ref 98–111)
Creatinine: 1.62 mg/dL — ABNORMAL HIGH (ref 0.61–1.24)
GFR, Est AFR Am: 44 mL/min — ABNORMAL LOW (ref >60)
GFR, Estimated: 38 mL/min — ABNORMAL LOW (ref >60)
Glucose, Bld: 131 mg/dL — ABNORMAL HIGH (ref 70–99)
Potassium: 4.5 mmol/L (ref 3.5–5.1)
Sodium: 135 mmol/L (ref 135–145)
Total Bilirubin: 0.7 mg/dL (ref 0.3–1.2)
Total Protein: 8.1 g/dL (ref 6.5–8.1)

## 2019-01-21 MED ORDER — SODIUM CHLORIDE 0.9 % IV SOLN
INTRAVENOUS | Status: DC
  Administered 2019-01-21: 09:00:00 via INTRAVENOUS
  Filled 2019-01-21: qty 250

## 2019-01-21 MED ORDER — DIPHENHYDRAMINE HCL 25 MG PO CAPS
50.0000 mg | ORAL_CAPSULE | Freq: Once | ORAL | Status: AC
  Administered 2019-01-21: 50 mg via ORAL

## 2019-01-21 MED ORDER — SODIUM CHLORIDE 0.9 % IV SOLN
15.3000 mg/kg | Freq: Once | INTRAVENOUS | Status: AC
  Administered 2019-01-21: 1000 mg via INTRAVENOUS
  Filled 2019-01-21: qty 40

## 2019-01-21 MED ORDER — METHYLPREDNISOLONE SODIUM SUCC 125 MG IJ SOLR
100.0000 mg | Freq: Once | INTRAMUSCULAR | Status: AC
  Administered 2019-01-21: 100 mg via INTRAVENOUS

## 2019-01-21 MED ORDER — METHYLPREDNISOLONE SODIUM SUCC 125 MG IJ SOLR
INTRAMUSCULAR | Status: AC
  Filled 2019-01-21: qty 2

## 2019-01-21 MED ORDER — ACETAMINOPHEN 325 MG PO TABS
650.0000 mg | ORAL_TABLET | Freq: Once | ORAL | Status: AC
  Administered 2019-01-21: 650 mg via ORAL

## 2019-01-21 MED ORDER — ACETAMINOPHEN 325 MG PO TABS
ORAL_TABLET | ORAL | Status: AC
  Filled 2019-01-21: qty 2

## 2019-01-21 MED ORDER — DIPHENHYDRAMINE HCL 25 MG PO CAPS
ORAL_CAPSULE | ORAL | Status: AC
  Filled 2019-01-21: qty 2

## 2019-01-21 NOTE — Patient Instructions (Addendum)
Daratumumab injection What is this medicine? DARATUMUMAB (dar a toom ue mab) is a monoclonal antibody. It is used to treat multiple myeloma. This medicine may be used for other purposes; ask your health care provider or pharmacist if you have questions. COMMON BRAND NAME(S): DARZALEX What should I tell my health care provider before I take this medicine? They need to know if you have any of these conditions: -infection (especially a virus infection such as chickenpox, herpes, or hepatitis B virus) -lung or breathing disease -an unusual or allergic reaction to daratumumab, other medicines, foods, dyes, or preservatives -pregnant or trying to get pregnant -breast-feeding How should I use this medicine? This medicine is for infusion into a vein. It is given by a health care professional in a hospital or clinic setting. Talk to your pediatrician regarding the use of this medicine in children. Special care may be needed. Overdosage: If you think you have taken too much of this medicine contact a poison control center or emergency room at once. NOTE: This medicine is only for you. Do not share this medicine with others. What if I miss a dose? Keep appointments for follow-up doses as directed. It is important not to miss your dose. Call your doctor or health care professional if you are unable to keep an appointment. What may interact with this medicine? Interactions have not been studied. Give your health care provider a list of all the medicines, herbs, non-prescription drugs, or dietary supplements you use. Also tell them if you smoke, drink alcohol, or use illegal drugs. Some items may interact with your medicine. This list may not describe all possible interactions. Give your health care provider a list of all the medicines, herbs, non-prescription drugs, or dietary supplements you use. Also tell them if you smoke, drink alcohol, or use illegal drugs. Some items may interact with your  medicine. What should I watch for while using this medicine? This drug may make you feel generally unwell. Report any side effects. Continue your course of treatment even though you feel ill unless your doctor tells you to stop. This medicine can cause serious allergic reactions. To reduce your risk you may need to take medicine before treatment with this medicine. Take your medicine as directed. This medicine can affect the results of blood tests to match your blood type. These changes can last for up to 6 months after the final dose. Your healthcare provider will do blood tests to match your blood type before you start treatment. Tell all of your healthcare providers that you are being treated with this medicine before receiving a blood transfusion. This medicine can affect the results of some tests used to determine treatment response; extra tests may be needed to evaluate response. Do not become pregnant while taking this medicine or for 3 months after stopping it. Women should inform their doctor if they wish to become pregnant or think they might be pregnant. There is a potential for serious side effects to an unborn child. Talk to your health care professional or pharmacist for more information. What side effects may I notice from receiving this medicine? Side effects that you should report to your doctor or health care professional as soon as possible: -allergic reactions like skin rash, itching or hives, swelling of the face, lips, or tongue -breathing problems -chills -cough -dizziness -feeling faint or lightheaded -headache -low blood counts - this medicine may decrease the number of white blood cells, red blood cells and platelets. You may be at increased   risk for infections and bleeding. -nausea, vomiting -shortness of breath -signs of decreased platelets or bleeding - bruising, pinpoint red spots on the skin, black, tarry stools, blood in the urine -signs of decreased red blood  cells - unusually weak or tired, feeling faint or lightheaded, falls -signs of infection - fever or chills, cough, sore throat, pain or difficulty passing urine -signs and symptoms of liver injury like dark yellow or brown urine; general ill feeling or flu-like symptoms; light-colored stools; loss of appetite; right upper belly pain; unusually weak or tired; yellowing of the eyes or skin Side effects that usually do not require medical attention (report to your doctor or health care professional if they continue or are bothersome): -back pain -constipation -loss of appetite -diarrhea -joint pain -muscle cramps -pain, tingling, numbness in the hands or feet -swelling of the ankles, feet, hands -tiredness -trouble sleeping This list may not describe all possible side effects. Call your doctor for medical advice about side effects. You may report side effects to FDA at 1-800-FDA-1088. Where should I keep my medicine? Keep out of the reach of children. This drug is given in a hospital or clinic and will not be stored at home. NOTE: This sheet is a summary. It may not cover all possible information. If you have questions about this medicine, talk to your doctor, pharmacist, or health care provider.  2019 [employer] Standard (2018-02-27 15:52:44) Diphenhydramine capsules or tablets What is this medicine? DIPHENHYDRAMINE (dye fen HYE dra meen) is an antihistamine. It is used to treat the symptoms of an allergic reaction. It is also used to treat Parkinson's disease. This medicine is also used to prevent and to treat motion sickness and as a nighttime sleep aid. This medicine may be used for other purposes; ask your health care provider or pharmacist if you have questions. COMMON BRAND NAME(S): Alka-Seltzer Plus Allergy, Aller-G-Time, Banophen, Benadryl Allergy, Benadryl Allergy Dye Free, Benadryl Allergy Kapgel, Benadryl Allergy Ultratab, Diphedryl, Diphenhist, Genahist, Geri-Dryl, PHARBEDRYL,  Q-Dryl, Gretta Began, Valu-Dryl, Vicks ZzzQuil Nightime Sleep-Aid What should I tell my health care provider before I take this medicine? They need to know if you have any of these conditions: -asthma or lung disease -glaucoma -high blood pressure or heart disease -liver disease -pain or difficulty passing urine -prostate trouble -ulcers or other stomach problems -an unusual or allergic reaction to diphenhydramine, other medicines foods, dyes, or preservatives such as sulfites -pregnant or trying to get pregnant -breast-feeding How should I use this medicine? Take this medicine by mouth with a full glass of water. Follow the directions on the prescription label. Take your doses at regular intervals. Do not take your medicine more often than directed. To prevent motion sickness start taking this medicine 30 to 60 minutes before you leave. Talk to your pediatrician regarding the use of this medicine in children. Special care may be needed. Patients over 49 years old may have a stronger reaction and need a smaller dose. Overdosage: If you think you have taken too much of this medicine contact a poison control center or emergency room at once. NOTE: This medicine is only for you. Do not share this medicine with others. What if I miss a dose? If you miss a dose, take it as soon as you can. If it is almost time for your next dose, take only that dose. Do not take double or extra doses. What may interact with this medicine? Do not take this medicine with any of the following medications: -MAOIs like Carbex,  Eldepryl, Marplan, Nardil, and Parnate This medicine may also interact with the following medications: -alcohol -barbiturates, like phenobarbital -medicines for bladder spasm like oxybutynin, tolterodine -medicines for blood pressure -medicines for depression, anxiety, or psychotic disturbances -medicines for movement abnormalities or Parkinson's disease -medicines for sleep -other medicines  for cold, cough or allergy -some medicines for the stomach like chlordiazepoxide, dicyclomine This list may not describe all possible interactions. Give your health care provider a list of all the medicines, herbs, non-prescription drugs, or dietary supplements you use. Also tell them if you smoke, drink alcohol, or use illegal drugs. Some items may interact with your medicine. What should I watch for while using this medicine? Visit your doctor or health care professional for regular check ups. Tell your doctor if your symptoms do not improve or if they get worse. Your mouth may get dry. Chewing sugarless gum or sucking hard candy, and drinking plenty of water may help. Contact your doctor if the problem does not go away or is severe. This medicine may cause dry eyes and blurred vision. If you wear contact lenses you may feel some discomfort. Lubricating drops may help. See your eye doctor if the problem does not go away or is severe. You may get drowsy or dizzy. Do not drive, use machinery, or do anything that needs mental alertness until you know how this medicine affects you. Do not stand or sit up quickly, especially if you are an older patient. This reduces the risk of dizzy or fainting spells. Alcohol may interfere with the effect of this medicine. Avoid alcoholic drinks. What side effects may I notice from receiving this medicine? Side effects that you should report to your doctor or health care professional as soon as possible: -allergic reactions like skin rash, itching or hives, swelling of the face, lips, or tongue -changes in vision -confused, agitated, nervous -irregular or fast heartbeat -tremor -trouble passing urine -unusual bleeding or bruising -unusually weak or tired Side effects that usually do not require medical attention (report to your doctor or health care professional if they continue or are bothersome): -constipation, diarrhea -drowsy -headache -loss of  appetite -stomach upset, vomiting -thick mucous This list may not describe all possible side effects. Call your doctor for medical advice about side effects. You may report side effects to FDA at 1-800-FDA-1088. Where should I keep my medicine? Keep out of the reach of children. Store at room temperature between 15 and 30 degrees C (59 and 86 degrees F). Keep container closed tightly. Throw away any unused medicine after the expiration date. NOTE: This sheet is a summary. It may not cover all possible information. If you have questions about this medicine, talk to your doctor, pharmacist, or health care provider.  2019 [employer] Standard (2007-11-16 17:06:22) Acetaminophen tablets or caplets What is this medicine? ACETAMINOPHEN (a set a MEE noe fen) is a pain reliever. It is used to treat mild pain and fever. This medicine may be used for other purposes; ask your health care provider or pharmacist if you have questions. COMMON BRAND NAME(S): Aceta, Actamin, Anacin Aspirin Free, Genapap, Genebs, Mapap, Pain & Fever, Pain and Fever, PAIN RELIEF, PAIN RELIEF Extra Strength, Pain Reliever, Panadol, PHARBETOL, Q-Pap, Q-Pap Extra Strength, Tylenol, Tylenol CrushableTablet, Tylenol Extra Strength, XS No Aspirin, XS Pain Reliever What should I tell my health care provider before I take this medicine? They need to know if you have any of these conditions: -if you often drink alcohol -liver disease -an unusual or allergic  reaction to acetaminophen, other medicines, foods, dyes, or preservatives -pregnant or trying to get pregnant -breast-feeding How should I use this medicine? Take this medicine by mouth with a glass of water. Follow the directions on the package or prescription label. Take your medicine at regular intervals. Do not take your medicine more often than directed. Talk to your pediatrician regarding the use of this medicine in children. While this drug may be prescribed for children as  young as 60 years of age for selected conditions, precautions do apply. Overdosage: If you think you have taken too much of this medicine contact a poison control center or emergency room at once. NOTE: This medicine is only for you. Do not share this medicine with others. What if I miss a dose? If you miss a dose, take it as soon as you can. If it is almost time for your next dose, take only that dose. Do not take double or extra doses. What may interact with this medicine? -alcohol -imatinib -isoniazid -other medicines with acetaminophen This list may not describe all possible interactions. Give your health care provider a list of all the medicines, herbs, non-prescription drugs, or dietary supplements you use. Also tell them if you smoke, drink alcohol, or use illegal drugs. Some items may interact with your medicine. What should I watch for while using this medicine? Tell your doctor or health care professional if the pain lasts more than 10 days (5 days for children), if it gets worse, or if there is a new or different kind of pain. Also, check with your doctor if a fever lasts for more than 3 days. Do not take other medicines that contain acetaminophen with this medicine. Always read labels carefully. If you have questions, ask your doctor or pharmacist. If you take too much acetaminophen get medical help right away. Too much acetaminophen can be very dangerous and cause liver damage. Even if you do not have symptoms, it is important to get help right away. What side effects may I notice from receiving this medicine? Side effects that you should report to your doctor or health care professional as soon as possible: -allergic reactions like skin rash, itching or hives, swelling of the face, lips, or tongue -breathing problems -fever or sore throat -redness, blistering, peeling or loosening of the skin, including inside the mouth -trouble passing urine or change in the amount of  urine -unusual bleeding or bruising -unusually weak or tired -yellowing of the eyes or skin Side effects that usually do not require medical attention (report to your doctor or health care professional if they continue or are bothersome): -headache -nausea, stomach upset This list may not describe all possible side effects. Call your doctor for medical advice about side effects. You may report side effects to FDA at 1-800-FDA-1088. Where should I keep my medicine? Keep out of reach of children. Store at room temperature between 20 and 25 degrees C (68 and 77 degrees F). Protect from moisture and heat. Throw away any unused medicine after the expiration date. NOTE: This sheet is a summary. It may not cover all possible information. If you have questions about this medicine, talk to your doctor, pharmacist, or health care provider.  2019 [employer] Standard (2013-03-22 12:54:16) Methylprednisolone Solution for Injection What is this medicine? METHYLPREDNISOLONE (meth ill pred NISS oh lone) is a corticosteroid. It is commonly used to treat inflammation of the skin, joints, lungs, and other organs. Common conditions treated include asthma, allergies, and arthritis. It is also  used for other conditions, such as blood disorders and diseases of the adrenal glands. This medicine may be used for other purposes; ask your health care provider or pharmacist if you have questions. COMMON BRAND NAME(S): A-Methapred, Solu-Medrol What should I tell my health care provider before I take this medicine? They need to know if you have any of these conditions: -Cushing's syndrome -eye disease, vision problems -diabetes -glaucoma -heart disease -high blood pressure -infection (especially a virus infection such as chickenpox, cold sores, or herpes) -liver disease -mental illness -myasthenia gravis -osteoporosis -recently received or scheduled to receive a vaccine -seizures -stomach or intestine  problems -thyroid disease -an unusual or allergic reaction to lactose, methylprednisolone, other medicines, foods, dyes, or preservatives -pregnant or trying to get pregnant -breast-feeding How should I use this medicine? This medicine is for injection or infusion into a vein. It is also for injection into a muscle. It is given by a health care professional in a hospital or clinic setting. Talk to your pediatrician regarding the use of this medicine in children. While this drug may be prescribed for selected conditions, precautions do apply. Overdosage: If you think you have taken too much of this medicine contact a poison control center or emergency room at once. NOTE: This medicine is only for you. Do not share this medicine with others. What if I miss a dose? This does not apply. What may interact with this medicine? Do not take this medicine with any of the following medications: -alefacept -echinacea -iopamidol -live virus vaccines -metyrapone -mifepristone This medicine may also interact with the following medications: -amphotericin B -aspirin and aspirin-like medicines -certain antibiotics like erythromycin, clarithromycin, troleandomycin -certain medicines for diabetes -certain medicines for fungal infection like ketoconazole -certain medicines for seizures like carbamazepine, phenobarbital, phenytoin -certain medicines that treat or prevent blood clots like warfarin -cyclosporine -digoxin -diuretics -male hormones, like estrogens and birth control pills -isoniazid -NSAIDS, medicines for pain and inflammation, like ibuprofen or naproxen -other medicines for myasthenia gravis -rifampin -vaccines This list may not describe all possible interactions. Give your health care provider a list of all the medicines, herbs, non-prescription drugs, or dietary supplements you use. Also tell them if you smoke, drink alcohol, or use illegal drugs. Some items may interact with your  medicine. What should I watch for while using this medicine? Tell your doctor or healthcare professional if your symptoms do not start to get better or if they get worse. Do not stop taking except on your doctor's advice. You may develop a severe reaction. Your doctor will tell you how much medicine to take. Your condition will be monitored carefully while you are receiving this medicine. This medicine may increase your risk of getting an infection. Tell your doctor or health care professional if you are around anyone with measles or chickenpox, or if you develop sores or blisters that do not heal properly. This medicine may affect blood sugar levels. If you have diabetes, check with your doctor or health care professional before you change your diet or the dose of your diabetic medicine. Tell your doctor or health care professional right away if you have any change in your eyesight. Using this medicine for a long time may increase your risk of low bone mass. Talk to your doctor about bone health. What side effects may I notice from receiving this medicine? Side effects that you should report to your doctor or health care professional as soon as possible: -allergic reactions like skin rash, itching or hives, swelling  of the face, lips, or tongue -bloody or tarry stools -changes in vision -hallucination, loss of contact with reality -muscle cramps -muscle pain -palpitations -signs and symptoms of high blood sugar such as dizziness; dry mouth; dry skin; fruity breath; nausea; stomach pain; increased hunger or thirst; increased urination -signs and symptoms of infection like fever or chills; cough; sore throat; pain or trouble passing urine -trouble passing urine or change in the amount of urine Side effects that usually do not require medical attention (report to your doctor or health care professional if they continue or are bothersome): -changes in emotions or  mood -constipation -diarrhea -excessive hair growth on the face or body -headache -nausea, vomiting -pain, redness, or irritation at site where injected -trouble sleeping -weight gain This list may not describe all possible side effects. Call your doctor for medical advice about side effects. You may report side effects to FDA at 1-800-FDA-1088. Where should I keep my medicine? This drug is given in a hospital or clinic and will not be stored at home. NOTE: This sheet is a summary. It may not cover all possible information. If you have questions about this medicine, talk to your doctor, pharmacist, or health care provider.  2019 [employer] Standard (2015-10-05 16:21:28)

## 2019-01-22 DIAGNOSIS — R2681 Unsteadiness on feet: Secondary | ICD-10-CM | POA: Diagnosis not present

## 2019-01-22 DIAGNOSIS — R262 Difficulty in walking, not elsewhere classified: Secondary | ICD-10-CM | POA: Diagnosis not present

## 2019-01-22 DIAGNOSIS — Z4789 Encounter for other orthopedic aftercare: Secondary | ICD-10-CM | POA: Diagnosis not present

## 2019-01-22 DIAGNOSIS — M6281 Muscle weakness (generalized): Secondary | ICD-10-CM | POA: Diagnosis not present

## 2019-01-25 ENCOUNTER — Other Ambulatory Visit: Payer: Self-pay

## 2019-01-25 ENCOUNTER — Ambulatory Visit: Payer: Medicare HMO | Admitting: Internal Medicine

## 2019-01-25 VITALS — BP 158/71 | HR 83 | Temp 98.2°F | Wt 144.0 lb

## 2019-01-25 DIAGNOSIS — M272 Inflammatory conditions of jaws: Secondary | ICD-10-CM

## 2019-01-25 DIAGNOSIS — R2681 Unsteadiness on feet: Secondary | ICD-10-CM | POA: Diagnosis not present

## 2019-01-25 DIAGNOSIS — Z4789 Encounter for other orthopedic aftercare: Secondary | ICD-10-CM | POA: Diagnosis not present

## 2019-01-25 DIAGNOSIS — M6281 Muscle weakness (generalized): Secondary | ICD-10-CM | POA: Diagnosis not present

## 2019-01-25 DIAGNOSIS — R262 Difficulty in walking, not elsewhere classified: Secondary | ICD-10-CM | POA: Diagnosis not present

## 2019-01-25 MED ORDER — PENICILLIN V POTASSIUM 500 MG PO TABS: 500.0000 mg | ORAL_TABLET | Freq: Four times a day (QID) | ORAL | 2 refills | Status: DC

## 2019-01-25 NOTE — Progress Notes (Signed)
RFV: follow up for jaw osteomyelitis  Patient ID: DEL OVERFELT, male   DOB: [birth year], 85 y.o.   MRN: [id number]  HPI 85yo M with osteonecrosis/osteomyelitis of jaw doing well overall. Continues to take penicillin for suppression. No diarrhea associated with antibiotic overall doing well. Sheltering in place and mask wearing often.  Outpatient Encounter Medications as of 01/25/2019  Medication Sig  . acyclovir (ZOVIRAX) 400 MG tablet Take 1 tablet (400 mg total) by mouth 2 (two) times daily.  Marland Kitchen amLODipine-benazepril (LOTREL) 5-10 MG capsule TAKE ONE CAPSULE EACH DAY  . dexamethasone (DECADRON) 4 MG tablet Take 1 tablet (4 mg total) by mouth daily. Take for 2 days starting the night of chemotherapy.  . dronabinol (MARINOL) 5 MG capsule Take 1 capsule (5 mg total) by mouth 2 (two) times daily before a meal.  . finasteride (PROSCAR) 5 MG tablet Take 5 mg by mouth daily.  Marland Kitchen LORazepam (ATIVAN) 0.5 MG tablet Take 1 tablet (0.5 mg total) by mouth every 6 (six) hours as needed (Nausea or vomiting).  . megestrol (MEGACE) 400 MG/10ML suspension Take 15 mLs (600 mg total) by mouth daily.  . Multiple Vitamin (MULTIVITAMIN WITH MINERALS) TABS tablet Take 1 tablet by mouth daily.  . penicillin v potassium (VEETID) 500 MG tablet Take 1 tablet (500 mg total) by mouth 4 (four) times daily.  Marland Kitchen POMALYST 2 MG capsule TAKE 1 CAPSULE DAILY. TAKE WITH WATER ON DAYS 1 THROUGH 21. REPEAT EVERY 28 DAYS  . Probiotic Product (PROBIOTIC PO) Take 1 capsule by mouth daily.  . prochlorperazine (COMPAZINE) 10 MG tablet Take 1 tablet (10 mg total) by mouth every 6 (six) hours as needed (Nausea or vomiting).  . pyridOXINE (VITAMIN B-6) 100 MG tablet Take 100 mg by mouth daily.   . traMADol (ULTRAM) 50 MG tablet Take 1 tablet (50 mg total) by mouth every 8 (eight) hours as needed.  . vitamin B-12 (CYANOCOBALAMIN) 500 MCG tablet Take 500 mcg by mouth daily.  . Vitamin D, Ergocalciferol, (DRISDOL) 50000 UNITS CAPS capsule  Take 50,000 Units by mouth every Sunday.   . montelukast (SINGULAIR) 10 MG tablet Take 1 tablet (10 mg total) by mouth at bedtime. (Patient not taking: Reported on 01/25/2019)   No facility-administered encounter medications on file as of 01/25/2019.      Patient Active Problem List   Diagnosis Date Noted  . Acute osteomyelitis of jaw 11/26/2018  . Bacteremia   . 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 Sepsis due to pneumonia ([place])   . Left lower lobe pneumonia ([place]) 10/06/2018  . Right hip pain 09/01/2018  . Iron deficiency anemia secondary to inadequate dietary iron intake 07/18/2016  . Multiple myeloma in relapse ([place]) 04/05/2016  . 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 Multiple myeloma in remission ([place]) 09/08/2015  . Myeloma ([place]) 08/23/2011     Health Maintenance Due  Topic Date Due  . TETANUS/TDAP  05/01/1951  . PNA vac Low Risk Adult (1 of 2 - PCV13) 04/30/1997    Social History   Tobacco Use  . Smoking status: Never Smoker  . Smokeless tobacco: Never Used  . Tobacco comment: never used tobacco  Substance Use Topics  . Alcohol use: Yes    Alcohol/week: 4.0 standard drinks    Types: 4 Glasses of wine per week  . Drug use: No    Review of Systems Review of Systems  Constitutional: Negative for fever, chills, diaphoresis, activity change, appetite change, fatigue and  unexpected weight change.  HENT: Negative for congestion, sore throat, rhinorrhea, sneezing, trouble swallowing and sinus pressure.  Eyes: Negative for photophobia and visual disturbance.  Respiratory: Negative for cough, chest tightness, shortness of breath, wheezing and stridor.  Cardiovascular: Negative for chest pain, palpitations and leg swelling.  Gastrointestinal: Negative for nausea, vomiting, abdominal pain, diarrhea, constipation, blood in stool, abdominal distention and anal bleeding.  Genitourinary: Negative for dysuria, hematuria, flank pain and difficulty  urinating.  Musculoskeletal: Negative for myalgias, back pain, joint swelling, arthralgias and gait problem.  Skin: Negative for color change, pallor, rash and wound.  Neurological: Negative for dizziness, tremors, weakness and light-headedness.  Hematological: Negative for adenopathy. Does not bruise/bleed easily.  Psychiatric/Behavioral: Negative for behavioral problems, confusion, sleep disturbance, dysphoric mood, decreased concentration and agitation.    Physical Exam   BP (!) 158/71   Pulse 83   Temp 98.2 F (36.8 C) (Oral)   Wt 144 lb (65.3 kg)   BMI 20.66 kg/m   Physical Exam  Constitutional: He is oriented to person, place, and time. He appears well-developed and well-nourished. No distress.  HENT:  Mouth/Throat: Oropharynx is clear and moist. No oropharyngeal exudate. Poor dentition Skin: Skin is warm and dry. No rash noted. No erythema.  Psychiatric: He has a normal mood and affect. His behavior is normal.    CBC Lab Results  Component Value Date   WBC 7.4 01/21/2019   RBC 3.53 (L) 01/21/2019   HGB 10.1 (L) 01/21/2019   HCT 31.2 (L) 01/21/2019   PLT 167 01/21/2019   MCV 88.4 01/21/2019   MCH 28.6 01/21/2019   MCHC 32.4 01/21/2019   RDW 16.5 (H) 01/21/2019   LYMPHSABS 0.4 (L) 01/21/2019   MONOABS 1.0 01/21/2019   EOSABS 0.6 (H) 01/21/2019    BMET Lab Results  Component Value Date   NA 135 01/21/2019   K 4.5 01/21/2019   CL 108 01/21/2019   CO2 21 (L) 01/21/2019   GLUCOSE 131 (H) 01/21/2019   BUN 36 (H) 01/21/2019   CREATININE 1.62 (H) 01/21/2019   CALCIUM 9.7 01/21/2019   GFRNONAA 38 (L) 01/21/2019   GFRAA 44 (L) 01/21/2019    Lab Results  Component Value Date   ESRSEDRATE 77 (H) 01/25/2019   Lab Results  Component Value Date   CRP 8.9 (H) 01/25/2019     Assessment and Plan  Jaw osteomeylitis = will 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 Will check inflammatory markers  Will have him come back in 1-2 months for  follow up

## 2019-01-26 LAB — SEDIMENTATION RATE: Sed Rate: 77 mm/h — ABNORMAL HIGH (ref 0–20)

## 2019-01-26 LAB — C-REACTIVE PROTEIN: CRP: 8.9 mg/L — ABNORMAL HIGH (ref <8.0)

## 2019-01-27 ENCOUNTER — Other Ambulatory Visit: Payer: Self-pay | Admitting: Hematology & Oncology

## 2019-01-28 ENCOUNTER — Inpatient Hospital Stay: Payer: Medicare HMO

## 2019-01-28 ENCOUNTER — Encounter: Payer: Self-pay | Admitting: Hematology & Oncology

## 2019-01-28 ENCOUNTER — Other Ambulatory Visit: Payer: Self-pay

## 2019-01-28 ENCOUNTER — Inpatient Hospital Stay (HOSPITAL_BASED_OUTPATIENT_CLINIC_OR_DEPARTMENT_OTHER): Payer: Medicare HMO | Admitting: Hematology & Oncology

## 2019-01-28 VITALS — BP 157/65 | HR 77 | Temp 97.9°F | Resp 18 | Wt 145.0 lb

## 2019-01-28 VITALS — BP 141/66 | HR 80 | Temp 98.2°F | Resp 18

## 2019-01-28 DIAGNOSIS — Z923 Personal history of irradiation: Secondary | ICD-10-CM | POA: Diagnosis not present

## 2019-01-28 DIAGNOSIS — Z5112 Encounter for antineoplastic immunotherapy: Secondary | ICD-10-CM | POA: Diagnosis not present

## 2019-01-28 DIAGNOSIS — C9001 Multiple myeloma in remission: Secondary | ICD-10-CM

## 2019-01-28 DIAGNOSIS — Z79899 Other long term (current) drug therapy: Secondary | ICD-10-CM | POA: Diagnosis not present

## 2019-01-28 DIAGNOSIS — C9002 Multiple myeloma in relapse: Secondary | ICD-10-CM | POA: Diagnosis not present

## 2019-01-28 DIAGNOSIS — D649 Anemia, unspecified: Secondary | ICD-10-CM

## 2019-01-28 DIAGNOSIS — N289 Disorder of kidney and ureter, unspecified: Secondary | ICD-10-CM | POA: Diagnosis not present

## 2019-01-28 DIAGNOSIS — C9000 Multiple myeloma not having achieved remission: Secondary | ICD-10-CM

## 2019-01-28 LAB — CBC WITH DIFFERENTIAL (CANCER CENTER ONLY)
Abs Immature Granulocytes: 0.04 10*3/uL (ref 0.00–0.07)
Basophils Absolute: 0 10*3/uL (ref 0.0–0.1)
Basophils Relative: 0 %
Eosinophils Absolute: 0.7 10*3/uL — ABNORMAL HIGH (ref 0.0–0.5)
Eosinophils Relative: 11 %
HCT: 27.7 % — ABNORMAL LOW (ref 39.0–52.0)
Hemoglobin: 8.9 g/dL — ABNORMAL LOW (ref 13.0–17.0)
Immature Granulocytes: 1 %
Lymphocytes Relative: 6 %
Lymphs Abs: 0.3 10*3/uL — ABNORMAL LOW (ref 0.7–4.0)
MCH: 28.7 pg (ref 26.0–34.0)
MCHC: 32.1 g/dL (ref 30.0–36.0)
MCV: 89.4 fL (ref 80.0–100.0)
Monocytes Absolute: 0.9 10*3/uL (ref 0.1–1.0)
Monocytes Relative: 14 %
Neutro Abs: 4.3 10*3/uL (ref 1.7–7.7)
Neutrophils Relative %: 68 %
Platelet Count: 187 10*3/uL (ref 150–400)
RBC: 3.1 MIL/uL — ABNORMAL LOW (ref 4.22–5.81)
RDW: 17.1 % — ABNORMAL HIGH (ref 11.5–15.5)
WBC Count: 6.2 10*3/uL (ref 4.0–10.5)
nRBC: 0 % (ref 0.0–0.2)

## 2019-01-28 LAB — CMP (CANCER CENTER ONLY)
ALT: 12 U/L (ref 0–44)
AST: 15 U/L (ref 15–41)
Albumin: 3.1 g/dL — ABNORMAL LOW (ref 3.5–5.0)
Alkaline Phosphatase: 63 U/L (ref 38–126)
Anion gap: 7 (ref 5–15)
BUN: 40 mg/dL — ABNORMAL HIGH (ref 8–23)
CO2: 18 mmol/L — ABNORMAL LOW (ref 22–32)
Calcium: 9.2 mg/dL (ref 8.9–10.3)
Chloride: 110 mmol/L (ref 98–111)
Creatinine: 1.81 mg/dL — ABNORMAL HIGH (ref 0.61–1.24)
GFR, Est AFR Am: 38 mL/min — ABNORMAL LOW (ref >60)
GFR, Estimated: 33 mL/min — ABNORMAL LOW (ref >60)
Glucose, Bld: 99 mg/dL (ref 70–99)
Potassium: 4.3 mmol/L (ref 3.5–5.1)
Sodium: 135 mmol/L (ref 135–145)
Total Bilirubin: 0.5 mg/dL (ref 0.3–1.2)
Total Protein: 7.6 g/dL (ref 6.5–8.1)

## 2019-01-28 MED ORDER — DEXAMETHASONE SODIUM PHOSPHATE 10 MG/ML IJ SOLN
INTRAMUSCULAR | Status: AC
  Filled 2019-01-28: qty 1

## 2019-01-28 MED ORDER — PROCHLORPERAZINE MALEATE 10 MG PO TABS
ORAL_TABLET | ORAL | Status: AC
  Filled 2019-01-28: qty 1

## 2019-01-28 MED ORDER — DARBEPOETIN ALFA 300 MCG/0.6ML IJ SOSY
PREFILLED_SYRINGE | INTRAMUSCULAR | Status: AC
  Filled 2019-01-28: qty 0.6

## 2019-01-28 MED ORDER — METHYLPREDNISOLONE SODIUM SUCC 125 MG IJ SOLR
INTRAMUSCULAR | Status: AC
  Filled 2019-01-28: qty 2

## 2019-01-28 MED ORDER — DARBEPOETIN ALFA 300 MCG/0.6ML IJ SOSY
300.0000 ug | PREFILLED_SYRINGE | INTRAMUSCULAR | Status: DC
  Administered 2019-01-28: 12:00:00 300 ug via SUBCUTANEOUS

## 2019-01-28 MED ORDER — SODIUM CHLORIDE 0.9 % IV SOLN
15.4000 mg/kg | Freq: Once | INTRAVENOUS | Status: AC
  Administered 2019-01-28: 11:00:00 1000 mg via INTRAVENOUS
  Filled 2019-01-28: qty 40

## 2019-01-28 MED ORDER — ACETAMINOPHEN 325 MG PO TABS
650.0000 mg | ORAL_TABLET | Freq: Once | ORAL | Status: AC
  Administered 2019-01-28: 650 mg via ORAL

## 2019-01-28 MED ORDER — ACETAMINOPHEN 325 MG PO TABS
ORAL_TABLET | ORAL | Status: AC
  Filled 2019-01-28: qty 2

## 2019-01-28 MED ORDER — DIPHENHYDRAMINE HCL 25 MG PO CAPS
50.0000 mg | ORAL_CAPSULE | Freq: Once | ORAL | Status: AC
  Administered 2019-01-28: 50 mg via ORAL

## 2019-01-28 MED ORDER — DIPHENHYDRAMINE HCL 25 MG PO CAPS
ORAL_CAPSULE | ORAL | Status: AC
  Filled 2019-01-28: qty 2

## 2019-01-28 MED ORDER — FAMOTIDINE IN NACL 20-0.9 MG/50ML-% IV SOLN
INTRAVENOUS | Status: AC
  Filled 2019-01-28: qty 50

## 2019-01-28 MED ORDER — SODIUM CHLORIDE 0.9 % IV SOLN
INTRAVENOUS | Status: DC
  Administered 2019-01-28: 09:00:00 via INTRAVENOUS
  Filled 2019-01-28: qty 250

## 2019-01-28 MED ORDER — METHYLPREDNISOLONE SODIUM SUCC 125 MG IJ SOLR
100.0000 mg | Freq: Once | INTRAMUSCULAR | Status: AC
  Administered 2019-01-28: 100 mg via INTRAVENOUS

## 2019-01-28 NOTE — Progress Notes (Signed)
Ok to treat with Creatinine 1.8 per Dr. Marin Olp.

## 2019-01-28 NOTE — Patient Instructions (Signed)
[facility] Discharge Instructions for Patients Receiving Chemotherapy  Today you received the following chemotherapy agents:  Darzalex  To help prevent nausea and vomiting after your treatment, we encourage you to take your nausea medication as prescribed.   If you develop nausea and vomiting that is not controlled by your nausea medication, call the clinic.   BELOW ARE SYMPTOMS THAT SHOULD BE REPORTED IMMEDIATELY:  *FEVER GREATER THAN 100.5 F  *CHILLS WITH OR WITHOUT FEVER  NAUSEA AND VOMITING THAT IS NOT CONTROLLED WITH YOUR NAUSEA MEDICATION  *UNUSUAL SHORTNESS OF BREATH  *UNUSUAL BRUISING OR BLEEDING  TENDERNESS IN MOUTH AND THROAT WITH OR WITHOUT PRESENCE OF ULCERS  *URINARY PROBLEMS  *BOWEL PROBLEMS  UNUSUAL RASH Items with * indicate a potential emergency and should be followed up as soon as possible.  Feel free to call the clinic should you have any questions or concerns. The clinic phone number is [phone].  Please show the CHEMO ALERT CARD at check-in to the Emergency Department and triage nurse.   

## 2019-01-28 NOTE — Progress Notes (Signed)
Hematology and Oncology Follow Up Visit  Christian Burns [id number] 06-27-1930 85 y.o. 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  Christian Burns looks a lot better.  His weight is up 6 pounds.  The Megace really is working for his appetite.  Her graph Christian Burns is tolerating the daratumumab pretty well.  Christian Burns has had 4 cycles to date.  Hopefully, we will see a response.  Christian Burns has had no problems with hip pain.  Christian Burns has had no cough or shortness of breath.  Christian Burns has had no change in bowel or bladder habits.  Christian Burns does have a right inguinal hernia.  Christian Burns is little worried about this.  I do not think that this will ever be a problem for him.  Christian Burns will need his Aranesp today.  His hemoglobin is only 8.9.  Christian Burns has had no bleeding.  His wife is still doing okay.  His main focus at this point is making sure that she is taking care of.  Overall, his performance status is ECOG 2.  Medications:  Allergies as of 01/28/2019      Reactions   Sulfa Antibiotics Itching   Sulfasalazine Itching      Medication List       Accurate as of January 28, 2019  8:18 AM. If you have  any questions, ask your nurse or doctor.        acyclovir 400 MG tablet Commonly known as: ZOVIRAX Take 1 tablet (400 mg total) by mouth 2 (two) times daily.   amLODipine-benazepril 5-10 MG capsule Commonly known as: LOTREL TAKE ONE CAPSULE EACH DAY   dexamethasone 4 MG tablet Commonly known as: DECADRON Take 1 tablet (4 mg total) by mouth daily. Take for 2 days starting the night of chemotherapy.   dronabinol 5 MG capsule Commonly known as: MARINOL Take 1 capsule (5 mg total) by mouth 2 (two) times daily before a meal.   finasteride 5 MG tablet Commonly known as: PROSCAR Take 5 mg by mouth daily.   LORazepam 0.5 MG tablet Commonly known as: Ativan Take 1 tablet (0.5 mg total) by mouth every 6 (six) hours as needed (Nausea or vomiting).   megestrol 400 MG/10ML suspension Commonly known as: MEGACE Take 15 mLs (600 mg total) by mouth daily.   montelukast 10 MG tablet Commonly known as: Singulair Take 1 tablet (10 mg total) by mouth at bedtime.   multivitamin with minerals Tabs tablet Take 1 tablet by mouth daily.   penicillin v potassium 500 MG tablet Commonly known as: VEETID Take 1 tablet (500 mg total) by mouth 4 (four) times daily.   Pomalyst 2 MG capsule Generic drug:  pomalidomide TAKE 1 CAPSULE DAILY. TAKE WITH WATER ON DAYS 1 THROUGH 21. REPEAT EVERY 28 DAYS   PROBIOTIC PO Take 1 capsule by mouth daily.   prochlorperazine 10 MG tablet Commonly known as: COMPAZINE Take 1 tablet (10 mg total) by mouth every 6 (six) hours as needed (Nausea or vomiting).   pyridOXINE 100 MG tablet Commonly known as: VITAMIN B-6 Take 100 mg by mouth daily.   traMADol 50 MG tablet Commonly known as: ULTRAM Take 1 tablet (50 mg total) by mouth every 8 (eight) hours as needed.   vitamin B-12 500 MCG tablet Commonly known as: CYANOCOBALAMIN Take 500 mcg by mouth daily.   Vitamin D (Ergocalciferol) 1.25 MG (50000 UT) Caps capsule Commonly known as: DRISDOL Take 50,000  Units by mouth every Sunday.       Allergies:  Allergies  Allergen Reactions  . Sulfa Antibiotics Itching  . Sulfasalazine Itching    Past Medical History, Surgical history, Social history, and Family History were reviewed and updated.  Review of Systems: Review of Systems  Constitutional: Positive for weight loss.  HENT: Negative.   Eyes: Negative.   Respiratory: Negative.   Cardiovascular: Negative.   Gastrointestinal: Negative.   Genitourinary: Positive for dysuria.  Musculoskeletal: Positive for joint pain and myalgias.  Skin: Negative.   Neurological: Negative.   Endo/Heme/Allergies: Negative.   Psychiatric/Behavioral: Negative.      Physical Exam:  vitals were not taken for this visit.   Wt Readings from Last 3 Encounters:  01/25/19 144 lb (65.3 kg)  01/08/19 139 lb 1.9 oz (63.1 kg)  12/31/18 141 lb 12 oz (64.3 kg)    Physical Exam Vitals signs reviewed.  HENT:     Head: Normocephalic and atraumatic.  Eyes:     Pupils: Pupils are equal, round, and reactive to light.  Neck:     Musculoskeletal: Normal range of motion.  Cardiovascular:     Rate and Rhythm: Normal rate and regular rhythm.     Heart sounds: Normal heart sounds.  Pulmonary:     Effort: Pulmonary effort is normal.     Breath sounds: Normal breath sounds.  Abdominal:     General: Bowel sounds are normal.     Palpations: Abdomen is soft.  Musculoskeletal: Normal range of motion.        General: No tenderness or deformity.  Lymphadenopathy:     Cervical: No cervical adenopathy.  Skin:    General: Skin is warm and dry.     Findings: No erythema or rash.  Neurological:     Mental Status: Christian Burns is alert and oriented to person, place, and time.  Psychiatric:        Behavior: Behavior normal.        Thought Content: Thought content normal.        Judgment: Judgment normal.      Lab Results  Component Value Date   WBC 7.4 01/21/2019   HGB 10.1 (L) 01/21/2019   HCT 31.2 (L) 01/21/2019    MCV 88.4 01/21/2019   PLT 167 01/21/2019   Lab Results  Component Value Date   FERRITIN 125 12/10/2018   IRON 40 (L) 12/10/2018   TIBC 220 12/10/2018   UIBC 180 12/10/2018   IRONPCTSAT 18 (L) 12/10/2018   Lab Results  Component Value Date   RETICCTPCT 1.3 12/10/2018   RBC 3.53 (L) 01/21/2019   Lab Results  Component Value Date   KPAFRELGTCHN 34.0 (H) 12/24/2018   LAMBDASER 1,049.7 (H) 12/24/2018  KAPLAMBRATIO 0.03 (L) 12/24/2018   Lab Results  Component Value Date   IGGSERUM 2,676 (H) 12/24/2018   IGGSERUM 2,711 (H) 12/24/2018   IGA 59 (L) 12/24/2018   IGA 63 12/24/2018   IGMSERUM 30 12/24/2018   IGMSERUM 33 12/24/2018   Lab Results  Component Value Date   TOTALPROTELP 6.8 12/24/2018   ALBUMINELP 3.0 12/24/2018   A1GS 0.3 12/24/2018   A2GS 0.8 12/24/2018   BETS 0.7 12/24/2018   BETA2SER 0.3 07/28/2015   GAMS 2.0 (H) 12/24/2018   MSPIKE 1.8 (H) 12/24/2018   SPEI Comment 11/26/2018     Chemistry      Component Value Date/Time   NA 135 01/21/2019 0758   NA 146 (H) 08/07/2017 1153   NA 141 01/09/2017 1004   K 4.5 01/21/2019 0758   K 4.6 08/07/2017 1153   K 4.4 01/09/2017 1004   CL 108 01/21/2019 0758   CL 108 08/07/2017 1153   CO2 21 (L) 01/21/2019 0758   CO2 26 08/07/2017 1153   CO2 22 01/09/2017 1004   BUN 36 (H) 01/21/2019 0758   BUN 24 (H) 08/07/2017 1153   BUN 23.9 01/09/2017 1004   CREATININE 1.62 (H) 01/21/2019 0758   CREATININE 1.56 (H) 05/26/2018 1508   CREATININE 1.5 (H) 01/09/2017 1004      Component Value Date/Time   CALCIUM 9.7 01/21/2019 0758   CALCIUM 8.8 08/07/2017 1153   CALCIUM 8.8 01/09/2017 1004   ALKPHOS 68 01/21/2019 0758   ALKPHOS 97 (H) 08/07/2017 1153   ALKPHOS 80 01/09/2017 1004   AST 16 01/21/2019 0758   AST 18 01/09/2017 1004   ALT 11 01/21/2019 0758   ALT 20 08/07/2017 1153   ALT 12 01/09/2017 1004   BILITOT 0.7 01/21/2019 0758   BILITOT 0.92 01/09/2017 1004       Impression and Plan: Christian Burns is a very  pleasant 85 yo caucasian gentleman with relapsed IgG lambda myeloma.   His focus of therapy has always been taking care of his wife.  Christian Burns wants to be able to help her.  She is crippled by arthritis.  We will continue daratumumab.  We will have to see what his myeloma studies look like today.  I am just very impressed with his resilience.  Christian Burns has had a myeloma for easily over 10 years.  Christian Burns is done remarkably well today and hopefully we will continue to see him improve and see his numbers get better.  I think that obviously   [name], MD 6/18/20208:18 AM

## 2019-01-29 LAB — KAPPA/LAMBDA LIGHT CHAINS
Kappa free light chain: 7.4 mg/L (ref 3.3–19.4)
Kappa, lambda light chain ratio: 0.01 — ABNORMAL LOW (ref 0.26–1.65)
Lambda free light chains: 1025.3 mg/L — ABNORMAL HIGH (ref 5.7–26.3)

## 2019-01-29 LAB — IGG, IGA, IGM
IgA: 15 mg/dL — ABNORMAL LOW (ref 61–437)
IgG (Immunoglobin G), Serum: 2934 mg/dL — ABNORMAL HIGH (ref 603–1613)
IgM (Immunoglobulin M), Srm: 14 mg/dL — ABNORMAL LOW (ref 15–143)

## 2019-02-02 LAB — PROTEIN ELECTROPHORESIS, SERUM, WITH REFLEX
A/G Ratio: 0.6 — ABNORMAL LOW (ref 0.7–1.7)
Albumin ELP: 2.9 g/dL (ref 2.9–4.4)
Alpha-1-Globulin: 0.3 g/dL (ref 0.0–0.4)
Alpha-2-Globulin: 1 g/dL (ref 0.4–1.0)
Beta Globulin: 0.8 g/dL (ref 0.7–1.3)
Gamma Globulin: 2.3 g/dL — ABNORMAL HIGH (ref 0.4–1.8)
Globulin, Total: 4.5 g/dL — ABNORMAL HIGH (ref 2.2–3.9)
M-Spike, %: 2.1 g/dL — ABNORMAL HIGH
SPEP Interpretation: 0
Total Protein ELP: 7.4 g/dL (ref 6.0–8.5)

## 2019-02-02 LAB — IMMUNOFIXATION REFLEX, SERUM
IgA: 12 mg/dL — ABNORMAL LOW (ref 61–437)
IgG (Immunoglobin G), Serum: 3522 mg/dL — ABNORMAL HIGH (ref 603–1613)
IgM (Immunoglobulin M), Srm: 15 mg/dL (ref 15–143)

## 2019-02-03 ENCOUNTER — Other Ambulatory Visit: Payer: Self-pay | Admitting: *Deleted

## 2019-02-03 DIAGNOSIS — C9002 Multiple myeloma in relapse: Secondary | ICD-10-CM

## 2019-02-03 DIAGNOSIS — C9000 Multiple myeloma not having achieved remission: Secondary | ICD-10-CM

## 2019-02-04 ENCOUNTER — Inpatient Hospital Stay: Payer: Medicare HMO

## 2019-02-04 ENCOUNTER — Inpatient Hospital Stay (HOSPITAL_BASED_OUTPATIENT_CLINIC_OR_DEPARTMENT_OTHER): Payer: Medicare HMO | Admitting: Hematology & Oncology

## 2019-02-04 ENCOUNTER — Other Ambulatory Visit: Payer: Self-pay

## 2019-02-04 ENCOUNTER — Encounter: Payer: Self-pay | Admitting: Hematology & Oncology

## 2019-02-04 VITALS — BP 149/79 | HR 82 | Temp 98.2°F | Resp 18

## 2019-02-04 DIAGNOSIS — C9002 Multiple myeloma in relapse: Secondary | ICD-10-CM

## 2019-02-04 DIAGNOSIS — C9000 Multiple myeloma not having achieved remission: Secondary | ICD-10-CM

## 2019-02-04 DIAGNOSIS — D649 Anemia, unspecified: Secondary | ICD-10-CM | POA: Diagnosis not present

## 2019-02-04 DIAGNOSIS — N289 Disorder of kidney and ureter, unspecified: Secondary | ICD-10-CM

## 2019-02-04 DIAGNOSIS — Z923 Personal history of irradiation: Secondary | ICD-10-CM

## 2019-02-04 DIAGNOSIS — Z79899 Other long term (current) drug therapy: Secondary | ICD-10-CM | POA: Diagnosis not present

## 2019-02-04 DIAGNOSIS — Z5112 Encounter for antineoplastic immunotherapy: Secondary | ICD-10-CM | POA: Diagnosis not present

## 2019-02-04 DIAGNOSIS — Z7189 Other specified counseling: Secondary | ICD-10-CM | POA: Insufficient documentation

## 2019-02-04 HISTORY — DX: Other specified counseling: Z71.89

## 2019-02-04 LAB — CMP (CANCER CENTER ONLY)
ALT: 12 U/L (ref 0–44)
AST: 15 U/L (ref 15–41)
Albumin: 3.1 g/dL — ABNORMAL LOW (ref 3.5–5.0)
Alkaline Phosphatase: 62 U/L (ref 38–126)
Anion gap: 7 (ref 5–15)
BUN: 44 mg/dL — ABNORMAL HIGH (ref 8–23)
CO2: 17 mmol/L — ABNORMAL LOW (ref 22–32)
Calcium: 10 mg/dL (ref 8.9–10.3)
Chloride: 111 mmol/L (ref 98–111)
Creatinine: 1.85 mg/dL — ABNORMAL HIGH (ref 0.61–1.24)
GFR, Est AFR Am: 37 mL/min — ABNORMAL LOW (ref >60)
GFR, Estimated: 32 mL/min — ABNORMAL LOW (ref >60)
Glucose, Bld: 114 mg/dL — ABNORMAL HIGH (ref 70–99)
Potassium: 4.6 mmol/L (ref 3.5–5.1)
Sodium: 135 mmol/L (ref 135–145)
Total Bilirubin: 0.4 mg/dL (ref 0.3–1.2)
Total Protein: 7.6 g/dL (ref 6.5–8.1)

## 2019-02-04 LAB — CBC WITH DIFFERENTIAL (CANCER CENTER ONLY)
Abs Immature Granulocytes: 0.02 10*3/uL (ref 0.00–0.07)
Basophils Absolute: 0 10*3/uL (ref 0.0–0.1)
Basophils Relative: 0 %
Eosinophils Absolute: 0.3 10*3/uL (ref 0.0–0.5)
Eosinophils Relative: 5 %
HCT: 31.4 % — ABNORMAL LOW (ref 39.0–52.0)
Hemoglobin: 10.1 g/dL — ABNORMAL LOW (ref 13.0–17.0)
Immature Granulocytes: 0 %
Lymphocytes Relative: 5 %
Lymphs Abs: 0.3 10*3/uL — ABNORMAL LOW (ref 0.7–4.0)
MCH: 29.2 pg (ref 26.0–34.0)
MCHC: 32.2 g/dL (ref 30.0–36.0)
MCV: 90.8 fL (ref 80.0–100.0)
Monocytes Absolute: 1 10*3/uL (ref 0.1–1.0)
Monocytes Relative: 14 %
Neutro Abs: 5.2 10*3/uL (ref 1.7–7.7)
Neutrophils Relative %: 76 %
Platelet Count: 258 10*3/uL (ref 150–400)
RBC: 3.46 MIL/uL — ABNORMAL LOW (ref 4.22–5.81)
RDW: 18.4 % — ABNORMAL HIGH (ref 11.5–15.5)
WBC Count: 6.8 10*3/uL (ref 4.0–10.5)
nRBC: 0 % (ref 0.0–0.2)

## 2019-02-04 MED ORDER — DIPHENHYDRAMINE HCL 25 MG PO CAPS
ORAL_CAPSULE | ORAL | Status: AC
  Filled 2019-02-04: qty 2

## 2019-02-04 MED ORDER — METHYLPREDNISOLONE SODIUM SUCC 125 MG IJ SOLR
INTRAMUSCULAR | Status: AC
  Filled 2019-02-04: qty 2

## 2019-02-04 MED ORDER — ACETAMINOPHEN 325 MG PO TABS
ORAL_TABLET | ORAL | Status: AC
  Filled 2019-02-04: qty 2

## 2019-02-04 NOTE — Progress Notes (Signed)
Per dr Christian Burns no treatment today.

## 2019-02-04 NOTE — Progress Notes (Signed)
DISCONTINUE ON PATHWAY REGIMEN - Multiple Myeloma and Other Plasma Cell Dyscrasias     Cycles 1 and 2: A cycle is every 28 days:     Daratumumab    Cycles 3 through 6: A cycle is every 28 days:     Daratumumab    Cycles 7 and beyond: A cycle is every 28 days:     Daratumumab   **Always confirm dose/schedule in your pharmacy ordering system**  REASON: Disease Progression PRIOR TREATMENT: [id number]: Daratumumab 16 mg/kg q28 Days Until Progression or Unacceptable Toxicity TREATMENT RESPONSE: Progressive Disease (PD)  START OFF PATHWAY REGIMEN - Multiple Myeloma and Other Plasma Cell Dyscrasias   [id number]:Bendamustine Dexamethasone Lenalidomide q28 Days:   A cycle is every 28 days:     Bendamustine      Lenalidomide      Dexamethasone      [id number]   **Always confirm dose/schedule in your pharmacy ordering system**  Patient Characteristics: Relapsed / Refractory, Second through Fourth Lines of Therapy R-ISS Staging: Not Applicable Disease Classification: Relapsed Line of Therapy: Fourth Line Intent of Therapy: Non-Curative / Palliative Intent, Discussed with Patient

## 2019-02-04 NOTE — Progress Notes (Signed)
Hematology and Oncology Follow Up Visit  Christian Burns [id number] 1930-01-31 85 y.o. 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 Palliative radiation therapy to right ilium Kyprolis/Cytoxanq 3 week dosing- s/p cycle 24 (Cytoxan restarted on cycle 13) - DC'd due to progression  Current Therapy:   Elotuzomab - started 10/29/2018 -- s/p cycle #1 -- d/c on 12/24/2018 Pomalidomide 2 mg po q day (21 on/7 off) - started 10/29/2018 Daratumumab - start cycle # 1 on 12/31/2018 -- d/c on -02/04/2019 Zometa IV q 4 weeks - on hold Aranesp 300 g subcutaneous as needed for hemoglobin less than 10 Radiation therapy to right femur, 10 fraction --completed 3000 rad on 11/06/2018   Interim History:  Christian Burns is here today for an unscheduled visit.  Unfortunately, I do not think that the daratumumab is helping.  He does not feel well.  He just feels more tired.  His last monoclonal studies are going back up.  His M spike was 2.1 g/dL.  His IgG level was 3500 mg/dL.  His lambda light chain was 102.5 mg/dL.  I told him that our options are becoming very limited now.  I really do not think that there is much else that we can do for him that we have a good success of working.  I think one option might be bendamustine.  I taught him about the possibility that nothing else might work and then if not, we will have to get hospice going.  He was not surprised by this.  I told him that I do not think that even with treatment, that he probably would make it to the end of the year.  He and his wife have wills made.  He and I talked about end-of-life care.  He DOES NOT want to be kept alive on machines.  I totally agree with this.   His quality of life is what is important to him.  He still wants to try to help his poor wife who is crippled with arthritis.  We talked that if we get hospice going, that we would not be able to do any more treatments.  I think that with bendamustine, the chance of a response probably would be less than 20%.  Again, has been through pretty much all standard therapy.  I know there are couple out that are out there that I am not sure he would do all that well with.  We just talked about how well he is done.  He really has done nicely for over 10 years.  Currently, his performance status is ECOG 2. Medications:  Allergies as of 02/04/2019      Reactions   Sulfa Antibiotics Itching   Sulfasalazine Itching      Medication List       Accurate as of February 04, 2019 11:23 AM. If you have any questions, ask your nurse or doctor.        acyclovir 400 MG tablet Commonly known as: ZOVIRAX Take 1 tablet (400 mg total) by mouth 2 (two) times daily.   amLODipine-benazepril 5-10 MG capsule Commonly known as: LOTREL TAKE ONE CAPSULE EACH DAY   dexamethasone 4 MG tablet Commonly known as: DECADRON Take 1 tablet (4 mg total) by mouth daily. Take  for 2 days starting the night of chemotherapy.   dronabinol 5 MG capsule Commonly known as: MARINOL Take 1 capsule (5 mg total) by mouth 2 (two) times daily before a meal.   finasteride 5 MG tablet Commonly known as: PROSCAR Take 5 mg by mouth daily.   LORazepam 0.5 MG tablet Commonly known as: Ativan Take 1 tablet (0.5 mg total) by mouth every 6 (six) hours as needed (Nausea or vomiting).   megestrol 400 MG/10ML suspension Commonly known as: MEGACE Take 15 mLs (600 mg total) by mouth daily.   montelukast 10 MG tablet Commonly known as: Singulair Take 1 tablet (10 mg total) by mouth at bedtime.   multivitamin with minerals Tabs tablet Take 1 tablet by mouth daily.   penicillin v potassium 500 MG tablet Commonly known as: VEETID Take  1 tablet (500 mg total) by mouth 4 (four) times daily.   Pomalyst 2 MG capsule Generic drug: pomalidomide TAKE 1 CAPSULE DAILY. TAKE WITH WATER ON DAYS 1 THROUGH 21. REPEAT EVERY 28 DAYS   PROBIOTIC PO Take 1 capsule by mouth daily.   prochlorperazine 10 MG tablet Commonly known as: COMPAZINE Take 1 tablet (10 mg total) by mouth every 6 (six) hours as needed (Nausea or vomiting).   pyridOXINE 100 MG tablet Commonly known as: VITAMIN B-6 Take 100 mg by mouth daily.   traMADol 50 MG tablet Commonly known as: ULTRAM Take 1 tablet (50 mg total) by mouth every 8 (eight) hours as needed.   vitamin B-12 500 MCG tablet Commonly known as: CYANOCOBALAMIN Take 500 mcg by mouth daily.   Vitamin D (Ergocalciferol) 1.25 MG (50000 UT) Caps capsule Commonly known as: DRISDOL Take 50,000 Units by mouth every Sunday.       Allergies:  Allergies  Allergen Reactions  . Sulfa Antibiotics Itching  . Sulfasalazine Itching    Past Medical History, Surgical history, Social history, and Family History were reviewed and updated.  Review of Systems: Review of Systems  Constitutional: Positive for weight loss.  HENT: Negative.   Eyes: Negative.   Respiratory: Negative.   Cardiovascular: Negative.   Gastrointestinal: Negative.   Genitourinary: Positive for dysuria.  Musculoskeletal: Positive for joint pain and myalgias.  Skin: Negative.   Neurological: Negative.   Endo/Heme/Allergies: Negative.   Psychiatric/Behavioral: Negative.      Physical Exam:  temperature is 98.2 F (36.8 C). His blood pressure is 149/79 (abnormal) and his pulse is 82. His respiration is 18.   Wt Readings from Last 3 Encounters:  01/28/19 145 lb (65.8 kg)  01/25/19 144 lb (65.3 kg)  01/08/19 139 lb 1.9 oz (63.1 kg)    Physical Exam Vitals signs reviewed.  HENT:     Head: Normocephalic and atraumatic.  Eyes:     Pupils: Pupils are equal, round, and reactive to light.  Neck:     Musculoskeletal:  Normal range of motion.  Cardiovascular:     Rate and Rhythm: Normal rate and regular rhythm.     Heart sounds: Normal heart sounds.  Pulmonary:     Effort: Pulmonary effort is normal.     Breath sounds: Normal breath sounds.  Abdominal:     General: Bowel sounds are normal.     Palpations: Abdomen is soft.  Musculoskeletal: Normal range of motion.        General: No tenderness or deformity.  Lymphadenopathy:     Cervical: No cervical adenopathy.  Skin:    General: Skin is warm and dry.  Findings: No erythema or rash.  Neurological:     Mental Status: He is alert and oriented to person, place, and time.  Psychiatric:        Behavior: Behavior normal.        Thought Content: Thought content normal.        Judgment: Judgment normal.      Lab Results  Component Value Date   WBC 6.8 02/04/2019   HGB 10.1 (L) 02/04/2019   HCT 31.4 (L) 02/04/2019   MCV 90.8 02/04/2019   PLT 258 02/04/2019   Lab Results  Component Value Date   FERRITIN 125 12/10/2018   IRON 40 (L) 12/10/2018   TIBC 220 12/10/2018   UIBC 180 12/10/2018   IRONPCTSAT 18 (L) 12/10/2018   Lab Results  Component Value Date   RETICCTPCT 1.3 12/10/2018   RBC 3.46 (L) 02/04/2019   Lab Results  Component Value Date   KPAFRELGTCHN 7.4 01/28/2019   LAMBDASER 1,025.3 (H) 01/28/2019   KAPLAMBRATIO 0.01 (L) 01/28/2019   Lab Results  Component Value Date   IGGSERUM 2,934 (H) 01/28/2019   IGGSERUM 3,522 (H) 01/28/2019   IGA 15 (L) 01/28/2019   IGA 12 (L) 01/28/2019   IGMSERUM 14 (L) 01/28/2019   IGMSERUM 15 01/28/2019   Lab Results  Component Value Date   TOTALPROTELP 7.4 01/28/2019   ALBUMINELP 2.9 01/28/2019   A1GS 0.3 01/28/2019   A2GS 1.0 01/28/2019   BETS 0.8 01/28/2019   BETA2SER 0.3 07/28/2015   GAMS 2.3 (H) 01/28/2019   MSPIKE 2.1 (H) 01/28/2019   SPEI Comment 11/26/2018     Chemistry      Component Value Date/Time   NA 135 02/04/2019 0807   NA 146 (H) 08/07/2017 1153   NA 141  01/09/2017 1004   K 4.6 02/04/2019 0807   K 4.6 08/07/2017 1153   K 4.4 01/09/2017 1004   CL 111 02/04/2019 0807   CL 108 08/07/2017 1153   CO2 17 (L) 02/04/2019 0807   CO2 26 08/07/2017 1153   CO2 22 01/09/2017 1004   BUN 44 (H) 02/04/2019 0807   BUN 24 (H) 08/07/2017 1153   BUN 23.9 01/09/2017 1004   CREATININE 1.85 (H) 02/04/2019 0807   CREATININE 1.56 (H) 05/26/2018 1508   CREATININE 1.5 (H) 01/09/2017 1004      Component Value Date/Time   CALCIUM 10.0 02/04/2019 0807   CALCIUM 8.8 08/07/2017 1153   CALCIUM 8.8 01/09/2017 1004   ALKPHOS 62 02/04/2019 0807   ALKPHOS 97 (H) 08/07/2017 1153   ALKPHOS 80 01/09/2017 1004   AST 15 02/04/2019 0807   AST 18 01/09/2017 1004   ALT 12 02/04/2019 0807   ALT 20 08/07/2017 1153   ALT 12 01/09/2017 1004   BILITOT 0.4 02/04/2019 0807   BILITOT 0.92 01/09/2017 1004       Impression and Plan: Mr. Stager is a very pleasant 85 yo caucasian gentleman with relapsed IgG lambda myeloma.   His focus of therapy has always been taking care of his wife.  He wants to be able to help her.  She is crippled by arthritis.  I think the bendamustine will be the last line of therapy for [place].  I told Mr. Mezera this.  He is well aware of this.  We will try the bendamustine next week.  I spent about 45 minutes with Mr. Jarrells today.  This was a tough meeting.  I did state that he has not done well with the monoclonal antibody  protocols that we have had.  I really do not think we need to do a bone marrow test on him.  Again we have to just focus on his quality of life.  I will try to do the bendamustine next week and we will see how he goes from there.  Again, he would be amenable to hospice if we do not see a response to the bendamustine.   [name], MD 6/25/202011:23 AM

## 2019-02-08 ENCOUNTER — Other Ambulatory Visit: Payer: Self-pay | Admitting: Hematology & Oncology

## 2019-02-09 ENCOUNTER — Other Ambulatory Visit: Payer: Self-pay | Admitting: *Deleted

## 2019-02-09 DIAGNOSIS — C9002 Multiple myeloma in relapse: Secondary | ICD-10-CM

## 2019-02-10 ENCOUNTER — Other Ambulatory Visit: Payer: Self-pay | Admitting: *Deleted

## 2019-02-10 ENCOUNTER — Inpatient Hospital Stay: Payer: Medicare HMO

## 2019-02-10 ENCOUNTER — Other Ambulatory Visit: Payer: Self-pay

## 2019-02-10 ENCOUNTER — Inpatient Hospital Stay: Payer: Medicare HMO | Attending: Hematology & Oncology

## 2019-02-10 VITALS — BP 145/80 | HR 90 | Temp 97.5°F | Resp 18

## 2019-02-10 DIAGNOSIS — Z5111 Encounter for antineoplastic chemotherapy: Secondary | ICD-10-CM | POA: Insufficient documentation

## 2019-02-10 DIAGNOSIS — C9000 Multiple myeloma not having achieved remission: Secondary | ICD-10-CM

## 2019-02-10 DIAGNOSIS — C9002 Multiple myeloma in relapse: Secondary | ICD-10-CM

## 2019-02-10 LAB — CBC WITH DIFFERENTIAL (CANCER CENTER ONLY)
Abs Immature Granulocytes: 0.02 10*3/uL (ref 0.00–0.07)
Basophils Absolute: 0 10*3/uL (ref 0.0–0.1)
Basophils Relative: 1 %
Eosinophils Absolute: 0.2 10*3/uL (ref 0.0–0.5)
Eosinophils Relative: 3 %
HCT: 32.7 % — ABNORMAL LOW (ref 39.0–52.0)
Hemoglobin: 10.7 g/dL — ABNORMAL LOW (ref 13.0–17.0)
Immature Granulocytes: 0 %
Lymphocytes Relative: 4 %
Lymphs Abs: 0.2 10*3/uL — ABNORMAL LOW (ref 0.7–4.0)
MCH: 29.3 pg (ref 26.0–34.0)
MCHC: 32.7 g/dL (ref 30.0–36.0)
MCV: 89.6 fL (ref 80.0–100.0)
Monocytes Absolute: 1.2 10*3/uL — ABNORMAL HIGH (ref 0.1–1.0)
Monocytes Relative: 18 %
Neutro Abs: 4.8 10*3/uL (ref 1.7–7.7)
Neutrophils Relative %: 74 %
Platelet Count: 271 10*3/uL (ref 150–400)
RBC: 3.65 MIL/uL — ABNORMAL LOW (ref 4.22–5.81)
RDW: 18.2 % — ABNORMAL HIGH (ref 11.5–15.5)
WBC Count: 6.5 10*3/uL (ref 4.0–10.5)
nRBC: 0 % (ref 0.0–0.2)

## 2019-02-10 LAB — CMP (CANCER CENTER ONLY)
ALT: 13 U/L (ref 0–44)
AST: 18 U/L (ref 15–41)
Albumin: 3.2 g/dL — ABNORMAL LOW (ref 3.5–5.0)
Alkaline Phosphatase: 64 U/L (ref 38–126)
Anion gap: 6 (ref 5–15)
BUN: 45 mg/dL — ABNORMAL HIGH (ref 8–23)
CO2: 19 mmol/L — ABNORMAL LOW (ref 22–32)
Calcium: 10.8 mg/dL — ABNORMAL HIGH (ref 8.9–10.3)
Chloride: 107 mmol/L (ref 98–111)
Creatinine: 1.97 mg/dL — ABNORMAL HIGH (ref 0.61–1.24)
GFR, Est AFR Am: 35 mL/min — ABNORMAL LOW (ref >60)
GFR, Estimated: 30 mL/min — ABNORMAL LOW (ref >60)
Glucose, Bld: 111 mg/dL — ABNORMAL HIGH (ref 70–99)
Potassium: 5 mmol/L (ref 3.5–5.1)
Sodium: 132 mmol/L — ABNORMAL LOW (ref 135–145)
Total Bilirubin: 0.7 mg/dL (ref 0.3–1.2)
Total Protein: 8.2 g/dL — ABNORMAL HIGH (ref 6.5–8.1)

## 2019-02-10 MED ORDER — ONDANSETRON HCL 8 MG PO TABS: 8.0000 mg | ORAL_TABLET | Freq: Two times a day (BID) | ORAL | 1 refills | Status: DC | PRN

## 2019-02-10 MED ORDER — LIDOCAINE-PRILOCAINE 2.5-2.5 % EX CREA: TOPICAL_CREAM | CUTANEOUS | 3 refills | Status: DC

## 2019-02-10 MED ORDER — DEXAMETHASONE SODIUM PHOSPHATE 10 MG/ML IJ SOLN
10.0000 mg | Freq: Once | INTRAMUSCULAR | Status: AC
  Administered 2019-02-10: 11:00:00 10 mg via INTRAVENOUS

## 2019-02-10 MED ORDER — PROCHLORPERAZINE MALEATE 10 MG PO TABS: 10.0000 mg | ORAL_TABLET | Freq: Four times a day (QID) | ORAL | 2 refills | Status: DC | PRN

## 2019-02-10 MED ORDER — PALONOSETRON HCL INJECTION 0.25 MG/5ML
INTRAVENOUS | Status: AC
  Filled 2019-02-10: qty 5

## 2019-02-10 MED ORDER — LORAZEPAM 0.5 MG PO TABS: 0.5000 mg | ORAL_TABLET | Freq: Four times a day (QID) | ORAL | 0 refills | Status: DC | PRN

## 2019-02-10 MED ORDER — SODIUM CHLORIDE 0.9 % IV SOLN
Freq: Once | INTRAVENOUS | Status: AC
  Administered 2019-02-10: 11:00:00 via INTRAVENOUS
  Filled 2019-02-10: qty 250

## 2019-02-10 MED ORDER — DEXAMETHASONE 4 MG PO TABS: ORAL_TABLET | ORAL | 3 refills | Status: DC

## 2019-02-10 MED ORDER — PALONOSETRON HCL INJECTION 0.25 MG/5ML
0.2500 mg | Freq: Once | INTRAVENOUS | Status: AC
  Administered 2019-02-10: 0.25 mg via INTRAVENOUS

## 2019-02-10 MED ORDER — SODIUM CHLORIDE 0.9 % IV SOLN
80.0000 mg/m2 | Freq: Once | INTRAVENOUS | Status: AC
  Administered 2019-02-10: 150 mg via INTRAVENOUS
  Filled 2019-02-10: qty 6

## 2019-02-10 MED ORDER — PROCHLORPERAZINE MALEATE 10 MG PO TABS: 10.0000 mg | ORAL_TABLET | Freq: Four times a day (QID) | ORAL | 1 refills | Status: DC | PRN

## 2019-02-10 MED ORDER — DEXAMETHASONE SODIUM PHOSPHATE 10 MG/ML IJ SOLN
INTRAMUSCULAR | Status: AC
  Filled 2019-02-10: qty 1

## 2019-02-10 NOTE — Progress Notes (Signed)
Per dr ennever okay to treat today despite labs °

## 2019-02-11 ENCOUNTER — Other Ambulatory Visit: Payer: Medicare HMO

## 2019-02-11 ENCOUNTER — Other Ambulatory Visit: Payer: Self-pay

## 2019-02-11 ENCOUNTER — Inpatient Hospital Stay: Payer: Medicare HMO

## 2019-02-11 VITALS — BP 153/79 | HR 98 | Temp 97.5°F | Resp 18

## 2019-02-11 DIAGNOSIS — C9002 Multiple myeloma in relapse: Secondary | ICD-10-CM | POA: Diagnosis not present

## 2019-02-11 DIAGNOSIS — C9000 Multiple myeloma not having achieved remission: Secondary | ICD-10-CM

## 2019-02-11 DIAGNOSIS — Z5111 Encounter for antineoplastic chemotherapy: Secondary | ICD-10-CM | POA: Diagnosis not present

## 2019-02-11 MED ORDER — DEXAMETHASONE SODIUM PHOSPHATE 10 MG/ML IJ SOLN
10.0000 mg | Freq: Once | INTRAMUSCULAR | Status: AC
  Administered 2019-02-11: 11:00:00 10 mg via INTRAVENOUS

## 2019-02-11 MED ORDER — SODIUM CHLORIDE 0.9 % IV SOLN
80.0000 mg/m2 | Freq: Once | INTRAVENOUS | Status: AC
  Administered 2019-02-11: 150 mg via INTRAVENOUS
  Filled 2019-02-11: qty 6

## 2019-02-11 MED ORDER — SODIUM CHLORIDE 0.9 % IV SOLN
Freq: Once | INTRAVENOUS | Status: AC
  Administered 2019-02-11: 11:00:00 via INTRAVENOUS
  Filled 2019-02-11: qty 250

## 2019-02-11 MED ORDER — DEXAMETHASONE SODIUM PHOSPHATE 10 MG/ML IJ SOLN
INTRAMUSCULAR | Status: AC
  Filled 2019-02-11: qty 1

## 2019-02-11 NOTE — Patient Instructions (Signed)
Bendamustine Injection What is this medicine? BENDAMUSTINE (BEN da MUS teen) is a chemotherapy drug. It is used to treat chronic lymphocytic leukemia and non-Hodgkin lymphoma. This medicine may be used for other purposes; ask your health care provider or pharmacist if you have questions. COMMON BRAND NAME(S): BELRAPZO, BENDEKA, Treanda What should I tell my health care provider before I take this medicine? They need to know if you have any of these conditions:  infection (especially a virus infection such as chickenpox, cold sores, or herpes)  kidney disease  liver disease  an unusual or allergic reaction to bendamustine, mannitol, other medicines, foods, dyes, or preservatives  pregnant or trying to get pregnant  breast-feeding How should I use this medicine? This medicine is for infusion into a vein. It is given by a health care professional in a hospital or clinic setting. Talk to your pediatrician regarding the use of this medicine in children. Special care may be needed. Overdosage: If you think you have taken too much of this medicine contact a poison control center or emergency room at once. NOTE: This medicine is only for you. Do not share this medicine with others. What if I miss a dose? It is important not to miss your dose. Call your doctor or health care professional if you are unable to keep an appointment. What may interact with this medicine? Do not take this medicine with any of the following medications:  clozapine This medicine may also interact with the following medications:  atazanavir  cimetidine  ciprofloxacin  enoxacin  fluvoxamine  medicines for seizures like carbamazepine and phenobarbital  mexiletine  rifampin  tacrine  thiabendazole  zileuton This list may not describe all possible interactions. Give your health care provider a list of all the medicines, herbs, non-prescription drugs, or dietary supplements you use. Also tell them if  you smoke, drink alcohol, or use illegal drugs. Some items may interact with your medicine. What should I watch for while using this medicine? This drug may make you feel generally unwell. This is not uncommon, as chemotherapy can affect healthy cells as well as cancer cells. Report any side effects. Continue your course of treatment even though you feel ill unless your doctor tells you to stop. You may need blood work done while you are taking this medicine. Call your doctor or healthcare provider for advice if you get a fever, chills or sore throat, or other symptoms of a cold or flu. Do not treat yourself. This drug decreases your body's ability to fight infections. Try to avoid being around people who are sick. This medicine may cause serious skin reactions. They can happen weeks to months after starting the medicine. Contact your healthcare provider right away if you notice fevers or flu-like symptoms with a rash. The rash may be red or purple and then turn into blisters or peeling of the skin. Or, you might notice a red rash with swelling of the face, lips or lymph nodes in your neck or under your arms. This medicine may increase your risk to bruise or bleed. Call your doctor or healthcare provider if you notice any unusual bleeding. Talk to your doctor about your risk of cancer. You may be more at risk for certain types of cancers if you take this medicine. Do not become pregnant while taking this medicine or for at least 6 months after stopping it. Women should inform their doctor if they wish to become pregnant or think they might be pregnant. Men should not   father a child while taking this medicine and for at least 3 months after stopping it. There is a potential for serious side effects to an unborn child. Talk to your healthcare provider or pharmacist for more information. Do not breast-feed an infant while taking this medicine or for at least 1 week after stopping it. This medicine may make it  more difficult to father a child. You should talk with your doctor or healthcare provider if you are concerned about your fertility. What side effects may I notice from receiving this medicine? Side effects that you should report to your doctor or health care professional as soon as possible:  allergic reactions like skin rash, itching or hives, swelling of the face, lips, or tongue  low blood counts - this medicine may decrease the number of white blood cells, red blood cells and platelets. You may be at increased risk for infections and bleeding.  rash, fever, and swollen lymph nodes  redness, blistering, peeling, or loosening of the skin, including inside the mouth  signs of infection like fever or chills, cough, sore throat, pain or difficulty passing urine  signs of decreased platelets or bleeding like bruising, pinpoint red spots on the skin, black, tarry stools, blood in the urine  signs of decreased red blood cells like being unusually weak or tired, fainting spells, lightheadedness  signs and symptoms of kidney injury like trouble passing urine or change in the amount of urine  signs and symptoms of liver injury like dark yellow or brown urine; general ill feeling or flu-like symptoms; light-colored stools; loss of appetite; nausea; right upper belly pain; unusually weak or tired; yellowing of the eyes or skin Side effects that usually do not require medical attention (report to your doctor or health care professional if they continue or are bothersome):  constipation  decreased appetite  diarrhea  headache  mouth sores  nausea, vomiting  tiredness This list may not describe all possible side effects. Call your doctor for medical advice about side effects. You may report side effects to FDA at 1-800-FDA-1088. Where should I keep my medicine? This drug is given in a hospital or clinic and will not be stored at home. NOTE: This sheet is a summary. It may not cover all  possible information. If you have questions about this medicine, talk to your doctor, pharmacist, or health care provider.  2020 [employer]vier/Gold Standard (2018-10-20 10:26:46)  

## 2019-02-18 ENCOUNTER — Ambulatory Visit: Payer: Medicare HMO

## 2019-02-18 ENCOUNTER — Other Ambulatory Visit: Payer: Medicare HMO

## 2019-02-19 ENCOUNTER — Telehealth: Payer: Self-pay | Admitting: *Deleted

## 2019-02-19 NOTE — Telephone Encounter (Signed)
Call received from patient wanting to know why he is taking Marinol.  He states that his pharmacist told him that he is taking it for pain.  Informed patient that Dr. Marin Olp has prescribed Marinol for him to increase his appetite.  Pt appreciative of information and has no further questions or concerns at this time.

## 2019-02-22 ENCOUNTER — Other Ambulatory Visit: Payer: Self-pay | Admitting: Hematology & Oncology

## 2019-02-25 ENCOUNTER — Telehealth: Payer: Self-pay | Admitting: *Deleted

## 2019-02-25 ENCOUNTER — Inpatient Hospital Stay: Payer: Medicare HMO

## 2019-02-25 ENCOUNTER — Inpatient Hospital Stay: Payer: Medicare HMO | Admitting: Hematology & Oncology

## 2019-02-25 NOTE — Telephone Encounter (Signed)
Patient forgot his decadron last Friday. He would like to know how he should manage this. Instructed patient to not catch up on this dose and to just take his regularly scheduled dose tomorrow. He understood.

## 2019-03-01 ENCOUNTER — Other Ambulatory Visit: Payer: Self-pay | Admitting: Hematology & Oncology

## 2019-03-01 DIAGNOSIS — I10 Essential (primary) hypertension: Secondary | ICD-10-CM

## 2019-03-02 ENCOUNTER — Inpatient Hospital Stay (HOSPITAL_COMMUNITY)
Admission: EM | Admit: 2019-03-02 | Discharge: 2019-03-05 | DRG: 871 | Disposition: A | Discharge status: Home Health | Payer: Medicare HMO | Attending: Internal Medicine | Admitting: Internal Medicine

## 2019-03-02 ENCOUNTER — Encounter (HOSPITAL_COMMUNITY): Payer: Self-pay | Admitting: Family Medicine

## 2019-03-02 ENCOUNTER — Other Ambulatory Visit: Payer: Self-pay

## 2019-03-02 ENCOUNTER — Emergency Department (HOSPITAL_COMMUNITY): Payer: Medicare HMO

## 2019-03-02 DIAGNOSIS — Z882 Allergy status to sulfonamides status: Secondary | ICD-10-CM

## 2019-03-02 DIAGNOSIS — Z6820 Body mass index (BMI) 20.0-20.9, adult: Secondary | ICD-10-CM

## 2019-03-02 DIAGNOSIS — Z66 Do not resuscitate: Secondary | ICD-10-CM | POA: Diagnosis present

## 2019-03-02 DIAGNOSIS — C9001 Multiple myeloma in remission: Secondary | ICD-10-CM | POA: Diagnosis present

## 2019-03-02 DIAGNOSIS — Z209 Contact with and (suspected) exposure to unspecified communicable disease: Secondary | ICD-10-CM | POA: Diagnosis not present

## 2019-03-02 DIAGNOSIS — R531 Weakness: Secondary | ICD-10-CM | POA: Diagnosis not present

## 2019-03-02 DIAGNOSIS — Z20828 Contact with and (suspected) exposure to other viral communicable diseases: Secondary | ICD-10-CM | POA: Diagnosis not present

## 2019-03-02 DIAGNOSIS — J181 Lobar pneumonia, unspecified organism: Secondary | ICD-10-CM | POA: Diagnosis not present

## 2019-03-02 DIAGNOSIS — J189 Pneumonia, unspecified organism: Secondary | ICD-10-CM | POA: Diagnosis present

## 2019-03-02 DIAGNOSIS — A419 Sepsis, unspecified organism: Principal | ICD-10-CM | POA: Diagnosis present

## 2019-03-02 DIAGNOSIS — Z923 Personal history of irradiation: Secondary | ICD-10-CM

## 2019-03-02 DIAGNOSIS — D649 Anemia, unspecified: Secondary | ICD-10-CM

## 2019-03-02 DIAGNOSIS — I129 Hypertensive chronic kidney disease with stage 1 through stage 4 chronic kidney disease, or unspecified chronic kidney disease: Secondary | ICD-10-CM | POA: Diagnosis present

## 2019-03-02 DIAGNOSIS — Z96641 Presence of right artificial hip joint: Secondary | ICD-10-CM | POA: Diagnosis present

## 2019-03-02 DIAGNOSIS — E872 Acidosis: Secondary | ICD-10-CM | POA: Diagnosis present

## 2019-03-02 DIAGNOSIS — Z1159 Encounter for screening for other viral diseases: Secondary | ICD-10-CM

## 2019-03-02 DIAGNOSIS — D631 Anemia in chronic kidney disease: Secondary | ICD-10-CM | POA: Diagnosis present

## 2019-03-02 DIAGNOSIS — R64 Cachexia: Secondary | ICD-10-CM | POA: Diagnosis present

## 2019-03-02 DIAGNOSIS — R0602 Shortness of breath: Secondary | ICD-10-CM | POA: Diagnosis not present

## 2019-03-02 DIAGNOSIS — R6520 Severe sepsis without septic shock: Secondary | ICD-10-CM | POA: Diagnosis present

## 2019-03-02 DIAGNOSIS — C9000 Multiple myeloma not having achieved remission: Secondary | ICD-10-CM | POA: Diagnosis present

## 2019-03-02 DIAGNOSIS — E871 Hypo-osmolality and hyponatremia: Secondary | ICD-10-CM | POA: Diagnosis present

## 2019-03-02 DIAGNOSIS — R1111 Vomiting without nausea: Secondary | ICD-10-CM | POA: Diagnosis not present

## 2019-03-02 DIAGNOSIS — Z79899 Other long term (current) drug therapy: Secondary | ICD-10-CM

## 2019-03-02 DIAGNOSIS — N183 Chronic kidney disease, stage 3 (moderate): Secondary | ICD-10-CM | POA: Diagnosis present

## 2019-03-02 DIAGNOSIS — R509 Fever, unspecified: Secondary | ICD-10-CM | POA: Diagnosis not present

## 2019-03-02 DIAGNOSIS — N179 Acute kidney failure, unspecified: Secondary | ICD-10-CM

## 2019-03-02 DIAGNOSIS — R197 Diarrhea, unspecified: Secondary | ICD-10-CM | POA: Diagnosis not present

## 2019-03-02 MED ORDER — ACETAMINOPHEN 500 MG PO TABS
1000.0000 mg | ORAL_TABLET | Freq: Once | ORAL | Status: AC
  Administered 2019-03-03: 1000 mg via ORAL
  Filled 2019-03-02: qty 2

## 2019-03-02 MED ORDER — SODIUM CHLORIDE 0.9 % IV SOLN
500.0000 mg | INTRAVENOUS | Status: DC
  Administered 2019-03-03 (×2): 500 mg via INTRAVENOUS
  Filled 2019-03-02 (×2): qty 500

## 2019-03-02 MED ORDER — SODIUM CHLORIDE 0.9 % IV SOLN
2.0000 g | INTRAVENOUS | Status: DC
  Administered 2019-03-03 – 2019-03-04 (×3): 2 g via INTRAVENOUS
  Filled 2019-03-02: qty 2
  Filled 2019-03-02: qty 20
  Filled 2019-03-02: qty 2

## 2019-03-02 NOTE — ED Triage Notes (Signed)
Patient is from [facility]. He is experiencing genaralized weakness and fever. Highest reported fever was 104.2. He complains of being cold to EMS and denies shortness of breath or cough. He shares a room with his spouse, who is also experiencing the same symptoms. Patient appears in no acute distress.

## 2019-03-02 NOTE — ED Provider Notes (Signed)
[facility] DEPT Provider Note   CSN: [id number] Arrival date & time: 03/02/19  2309    History   Chief Complaint Chief Complaint  Patient presents with  . Fever  . Fatigue    HPI Christian Burns is a 85 y.o. male with a history of multiple myeloma, CKD stage III, osteomyelitis who presents to the emergency department by EMS from [facility] with a chief complaint of generalized weakness.  The patient endorses generalized weakness, onset earlier tonight.  He reports that he was ambulating with his walker when he felt dizzy, causing him to fall.  He reports that he fell onto for of his extremities.  He denies hitting his head, syncope, nausea, or vomiting.  He reports that he was ultimately able to get up from the fall using his walker.  He denies any pain in his arms or legs.  He shares a room with his wife who has also been having generalized weakness.  Patient was unaware that he had a fever until his temperature was checked by EMS.  He reports that he had some lower abdominal discomfort earlier in the day, but reports this has since resolved.  Patient denies shortness of breath, cough, chest pain, nausea, vomiting, diarrhea, back pain, dysuria, neck pain or stiffness, rash.   Patient is a DNR.      The history is provided by the patient. No language interpreter was used.    Past Medical History:  Diagnosis Date  . CKD (chronic kidney disease) 07/06/2018   CKD stage III  . Goals of care, counseling/discussion 02/04/2019  . History of radiation therapy 06/17/13-07/06/13   35 gray to upper lumbar spine  . 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 Hypertension   . Inguinal hernia   . Multiple myeloma [facility]) Sept 2010  . Multiple myeloma in relapse ([place]) 04/05/2016  . Radiation 09/26/15-10/20/15   lower thoracic spine, upper lumbar spine 30 gray    Patient Active Problem List   Diagnosis Date Noted  . Goals of care, counseling/discussion  02/04/2019  . Acute osteomyelitis of jaw 11/26/2018  . Bacteremia   . 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 Sepsis due to pneumonia ([place])   . Left lower lobe pneumonia ([place]) 10/06/2018  . Right hip pain 09/01/2018  . Iron deficiency anemia secondary to inadequate dietary iron intake 07/18/2016  . Multiple myeloma in relapse ([place]) 04/05/2016  . 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 Multiple myeloma in remission ([place]) 09/08/2015  . Myeloma ([place]) 08/23/2011    Past Surgical History:  Procedure Laterality Date  . COLONOSCOPY    . HIP PINNING,CANNULATED Right 09/01/2018   Procedure: [facility] NAIL HIP PINNING;  Surgeon: Melrose Nakayama, MD;  Location: [facility];  Service: Orthopedics;  Laterality: Right;  . TEE WITHOUT CARDIOVERSION N/A 10/09/2018   Procedure: TRANSESOPHAGEAL ECHOCARDIOGRAM (TEE);  Surgeon: Acie Fredrickson Wonda Cheng, MD;  Location: [facility] ENDOSCOPY;  Service: Cardiovascular;  Laterality: N/A;  . TONSILLECTOMY          Home Medications    Prior to Admission medications   Medication Sig Start Date End Date Taking? Authorizing Provider  amLODipine-benazepril (LOTREL) 5-10 MG capsule TAKE ONE CAPSULE EACH DAY Patient taking differently: Take 1 capsule by mouth daily.  03/01/19  Yes Ennever, Rudell Cobb, MD  dexamethasone (DECADRON) 4 MG tablet Take 5 tablets (57m) on days 1,8,15 and 22 of chemotherapy. Repeat every 28 days 02/10/19  Yes EVolanda Napoleon MD  dronabinol ([facility]  5 MG capsule Take 1 capsule (5 mg total) by mouth 2 (two) times daily before a meal. 12/31/18  Yes Ennever, Rudell Cobb, MD  finasteride (PROSCAR) 5 MG tablet Take 5 mg by mouth daily.   Yes [provider]  lidocaine-prilocaine (EMLA) cream Apply to affected area once 02/10/19  Yes Ennever, Rudell Cobb, MD  LORazepam (ATIVAN) 0.5 MG tablet Take 1 tablet (0.5 mg total) by mouth every 6 (six) hours as needed (Nausea or vomiting). 02/10/19  Yes [name], MD  megestrol (MEGACE) 400 MG/10ML  suspension Take 15 mLs (600 mg total) by mouth daily. 01/08/19  Yes [name], MD  Multiple Vitamin (MULTIVITAMIN WITH MINERALS) TABS tablet Take 1 tablet by mouth daily.   Yes [provider]  ondansetron (ZOFRAN) 8 MG tablet Take 1 tablet (8 mg total) by mouth 2 (two) times daily as needed for refractory nausea / vomiting. Start on day 2 after Bendamustine chemotherapy. 02/10/19  Yes [name], MD  penicillin v potassium (VEETID) 500 MG tablet Take 1 tablet (500 mg total) by mouth 4 (four) times daily. 01/25/19  Yes Carlyle Basques, MD  Probiotic Product (PROBIOTIC PO) Take 1 capsule by mouth daily.   Yes [provider]  prochlorperazine (COMPAZINE) 10 MG tablet Take 1 tablet (10 mg total) by mouth every 6 (six) hours as needed (nausea and vomiting). 02/10/19  Yes [name], MD  pyridOXINE (VITAMIN B-6) 100 MG tablet Take 100 mg by mouth daily.  09/17/13  Yes [name], MD  traMADol (ULTRAM) 50 MG tablet TAKE ONE TABLET EVERY EIGHT HOURS AS NEEDED Patient taking differently: Take 50 mg by mouth every 8 (eight) hours as needed for moderate pain.  02/08/19  Yes Ennever, Rudell Cobb, MD  vitamin B-12 (CYANOCOBALAMIN) 500 MCG tablet Take 500 mcg by mouth daily.   Yes [provider]  Vitamin D, Ergocalciferol, (DRISDOL) 50000 UNITS CAPS capsule Take 50,000 Units by mouth every Sunday.    Yes [provider]  montelukast (SINGULAIR) 10 MG tablet Take 1 tablet (10 mg total) by mouth at bedtime. Patient not taking: Reported on 01/25/2019 10/29/18   [place], Holli Humbles, NP  POMALYST 2 MG capsule TAKE 1 CAPSULE DAILY. TAKE WITH WATER ON DAYS 1 THROUGH 21. REPEAT EVERY 28 DAYS Patient not taking: Reported on 03/03/2019 01/20/19   [name], MD    Family History History reviewed. No pertinent family history.  Social History Social History   Tobacco Use  . Smoking status: Never Smoker  . Smokeless tobacco: Never Used  . Tobacco comment: never  used tobacco  Substance Use Topics  . Alcohol use: Yes    Alcohol/week: 4.0 standard drinks    Types: 4 Glasses of wine per week  . Drug use: No     Allergies   Sulfa antibiotics and Sulfasalazine   Review of Systems Review of Systems  Constitutional: Positive for fever. Negative for appetite change.  HENT: Negative for congestion, postnasal drip and sore throat.   Respiratory: Negative for shortness of breath and wheezing.   Cardiovascular: Negative for chest pain, palpitations and leg swelling.  Gastrointestinal: Negative for abdominal pain, anal bleeding, blood in stool, diarrhea, nausea and vomiting.  Genitourinary: Negative for dysuria.  Musculoskeletal: Negative for back pain, myalgias, neck pain and neck stiffness.  Skin: Negative for rash.  Allergic/Immunologic: Negative for immunocompromised state.  Neurological: Positive for dizziness and weakness (generalized weakness). Negative for syncope, speech difficulty and headaches.  Psychiatric/Behavioral:  Negative for confusion.     Physical Exam Updated Vital Signs BP 121/68   Pulse 90   Temp 98.7 F (37.1 C) (Oral)   Resp 18   Ht '5\' 10"'$  (1.778 m)   Wt 63.5 kg   SpO2 98%   BMI 20.09 kg/m   Physical Exam Vitals signs and nursing note reviewed.  Constitutional:      Appearance: He is well-developed.     Comments: Cachectic, chronically ill-appearing elderly male. NAD.   HENT:     Head: Normocephalic.  Eyes:     Conjunctiva/sclera: Conjunctivae normal.  Neck:     Musculoskeletal: Neck supple.  Cardiovascular:     Rate and Rhythm: Regular rhythm. Tachycardia present.     Heart sounds: Normal heart sounds. No murmur. No friction rub. No gallop.   Pulmonary:     Effort: Pulmonary effort is normal. No respiratory distress.     Breath sounds: No stridor. No wheezing.     Comments: Crackles in the left base.  Lungs are otherwise clear to auscultation bilaterally.  He is tachypneic, but has no retractions or  accessory muscle use. Chest:     Chest wall: No tenderness.  Abdominal:     General: There is no distension.     Palpations: Abdomen is soft. There is no mass.     Tenderness: There is no abdominal tenderness. There is no right CVA tenderness, left CVA tenderness, guarding or rebound.     Hernia: No hernia is present.  Musculoskeletal:     Comments: No focal tenderness to the bilateral upper or lower extremities.  Sensation is intact and equal.  5/5 strength throughout.  Skin:    General: Skin is warm and dry.     Capillary Refill: Capillary refill takes less than 2 seconds.  Neurological:     Mental Status: He is alert.  Psychiatric:        Behavior: Behavior normal.      ED Treatments / Results  Labs (all labs ordered are listed, but only abnormal results are displayed) Labs Reviewed  COMPREHENSIVE METABOLIC PANEL - Abnormal; Notable for the following components:      Result Value   Sodium 132 (*)    CO2 13 (*)    BUN 47 (*)    Creatinine, Ser 2.03 (*)    Calcium 8.4 (*)    Albumin 2.6 (*)    GFR calc non Af Amer 29 (*)    GFR calc Af Amer 33 (*)    All other components within normal limits  CBC WITH DIFFERENTIAL/PLATELET - Abnormal; Notable for the following components:   RBC 3.18 (*)    Hemoglobin 9.2 (*)    HCT 28.9 (*)    RDW 17.8 (*)    Neutro Abs 8.7 (*)    Lymphs Abs 0.1 (*)    All other components within normal limits  PROTIME-INR - Abnormal; Notable for the following components:   Prothrombin Time 16.0 (*)    INR 1.3 (*)    All other components within normal limits  URINALYSIS, ROUTINE W REFLEX MICROSCOPIC - Abnormal; Notable for the following components:   Hgb urine dipstick MODERATE (*)    Protein, ur 100 (*)    All other components within normal limits  BLOOD GAS, ARTERIAL - Abnormal; Notable for the following components:   pCO2 arterial 19.4 (*)    Bicarbonate 12.4 (*)    Acid-base deficit 10.1 (*)    All other components within normal limits  SARS CORONAVIRUS 2 (HOSPITAL ORDER, [facility])  CULTURE, BLOOD (ROUTINE X 2)  CULTURE, BLOOD (ROUTINE X 2)  URINE CULTURE  LACTIC ACID, PLASMA  APTT    EKG EKG Interpretation  Date/Time:  Tuesday March 02 2019 23:27:57 EDT Ventricular Rate:  115 PR Interval:    QRS Duration: 72 QT Interval:  311 QTC Calculation: 431 R Axis:   82 Text Interpretation:  Sinus tachycardia Borderline right axis deviation Nonspecific T abnrm, anterolateral leads No significant change was found Confirmed by Ezequiel Essex [phone]) on 03/03/2019 12:28:29 AM   Radiology Dg Chest Port 1 View  Result Date: 03/03/2019 CLINICAL DATA:  85 year old male with fever and shortness of breath. EXAM: PORTABLE CHEST 1 VIEW COMPARISON:  Chest radiograph dated 10/06/2018 FINDINGS: Areas of hazy density at the left lung base may represent pneumonia. Clinical correlation is recommended. The right lung is clear. No pneumothorax. The cardiac silhouette is within normal limits. No acute osseous pathology. IMPRESSION: Left lung base hazy density may represent pneumonia. Clinical correlation is recommended. Electronically Signed   By: Anner Crete M.D.   On: 03/03/2019 00:40    Procedures .Critical Care Performed by: Joanne Gavel, PA-C Authorized by: Joanne Gavel, PA-C   Critical care provider statement:    Critical care time (minutes):  40   Critical care time was exclusive of:  Separately billable procedures and treating other patients and teaching time   Critical care was necessary to treat or prevent imminent or life-threatening deterioration of the following conditions:  Sepsis   Critical care was time spent personally by me on the following activities:  Ordering and performing treatments and interventions, ordering and review of laboratory studies, ordering and review of radiographic studies, pulse oximetry, re-evaluation of patient's condition, examination of patient, evaluation of  patient's response to treatment, development of treatment plan with patient or surrogate, obtaining history from patient or surrogate and review of old charts   (including critical care time)  Medications Ordered in ED Medications  cefTRIAXone (ROCEPHIN) 2 g in sodium chloride 0.9 % 100 mL IVPB (0 g Intravenous Stopped 03/03/19 0156)  azithromycin (ZITHROMAX) 500 mg in sodium chloride 0.9 % 250 mL IVPB (500 mg Intravenous New Bag/Given 03/03/19 0155)  0.9 %  sodium chloride infusion (500 mLs Intravenous New Bag/Given 03/03/19 0102)  sodium chloride 0.9 % bolus 500 mL (has no administration in time range)  acetaminophen (TYLENOL) tablet 1,000 mg (1,000 mg Oral Given 03/03/19 0040)     Initial Impression / Assessment and Plan / ED Course  I have reviewed the triage vital signs and the nursing notes.  Pertinent labs & imaging results that were available during my care of the patient were reviewed by me and considered in my medical decision making (see chart for details).        85 year old male with a history of multiple myeloma, CKD stage III, osteomyelitis presenting by EMS from [facility] with fever, dizziness, and generalized weakness.  The patient had a fall from standing while using his walker, but is having no pain associated with the fall.  He did not hit his head and had no syncope.  Rectal temp of 102.6 on arrival and he is tachycardic and tachypneic.  Sepsis order set was initiated.  He was given azithromycin and Rocephin for presumed pneumonia as the source.  COVID-19 test was ordered and is negative.  Of note, patient's metabolic panel with a bicarb of 13.  Lactate was normal.  Anion gap was normal.  ABG was ordered with decreased pCO2 and bicarb. PH of 7.4.21.  At this time, I am uncertain of the etiology of the patient's decreased bicarb.  Initial lactate was normal.  Given the patient's history of EF of 45%, 30 cc/kg bolus was deferred.  He was, however, given a 500 cc IV  fluid bolus since his creatinine was mildly increased from baseline of 1.6-1.8.   Labs are otherwise at the patient's baseline.  The patient was seen and independently evaluated by Dr. Melanee Left, attending physician.  Consult to the hospitalist team and Dr. Josephine Cables will accept. The patient appears reasonably stabilized for admission considering the current resources, flow, and capabilities available in the ED at this time, and I doubt any other [facility] requiring further screening and/or treatment in the ED prior to admission.   Final Clinical Impressions(s) / ED Diagnoses   Final diagnoses:  Sepsis without acute organ dysfunction, due to unspecified organism [facility])  Pneumonia of left lower lobe due to infectious organism [facility])    ED Discharge Orders    None       Joanne Gavel, PA-C 03/03/19 0414    Ezequiel Essex, MD 03/03/19 1533

## 2019-03-03 DIAGNOSIS — C9000 Multiple myeloma not having achieved remission: Secondary | ICD-10-CM

## 2019-03-03 DIAGNOSIS — Z923 Personal history of irradiation: Secondary | ICD-10-CM | POA: Diagnosis not present

## 2019-03-03 DIAGNOSIS — E871 Hypo-osmolality and hyponatremia: Secondary | ICD-10-CM | POA: Diagnosis present

## 2019-03-03 DIAGNOSIS — Z66 Do not resuscitate: Secondary | ICD-10-CM | POA: Diagnosis present

## 2019-03-03 DIAGNOSIS — N179 Acute kidney failure, unspecified: Secondary | ICD-10-CM | POA: Diagnosis not present

## 2019-03-03 DIAGNOSIS — J181 Lobar pneumonia, unspecified organism: Secondary | ICD-10-CM | POA: Diagnosis present

## 2019-03-03 DIAGNOSIS — A419 Sepsis, unspecified organism: Secondary | ICD-10-CM | POA: Diagnosis present

## 2019-03-03 DIAGNOSIS — Z79899 Other long term (current) drug therapy: Secondary | ICD-10-CM | POA: Diagnosis not present

## 2019-03-03 DIAGNOSIS — Z882 Allergy status to sulfonamides status: Secondary | ICD-10-CM | POA: Diagnosis not present

## 2019-03-03 DIAGNOSIS — R6520 Severe sepsis without septic shock: Secondary | ICD-10-CM | POA: Diagnosis present

## 2019-03-03 DIAGNOSIS — R64 Cachexia: Secondary | ICD-10-CM | POA: Diagnosis present

## 2019-03-03 DIAGNOSIS — C9001 Multiple myeloma in remission: Secondary | ICD-10-CM | POA: Diagnosis present

## 2019-03-03 DIAGNOSIS — Z6820 Body mass index (BMI) 20.0-20.9, adult: Secondary | ICD-10-CM | POA: Diagnosis not present

## 2019-03-03 DIAGNOSIS — E872 Acidosis: Secondary | ICD-10-CM | POA: Diagnosis present

## 2019-03-03 DIAGNOSIS — D649 Anemia, unspecified: Secondary | ICD-10-CM

## 2019-03-03 DIAGNOSIS — R5381 Other malaise: Secondary | ICD-10-CM | POA: Diagnosis not present

## 2019-03-03 DIAGNOSIS — R0602 Shortness of breath: Secondary | ICD-10-CM | POA: Diagnosis not present

## 2019-03-03 DIAGNOSIS — J189 Pneumonia, unspecified organism: Secondary | ICD-10-CM

## 2019-03-03 DIAGNOSIS — D631 Anemia in chronic kidney disease: Secondary | ICD-10-CM | POA: Diagnosis present

## 2019-03-03 DIAGNOSIS — Z96641 Presence of right artificial hip joint: Secondary | ICD-10-CM | POA: Diagnosis present

## 2019-03-03 DIAGNOSIS — I129 Hypertensive chronic kidney disease with stage 1 through stage 4 chronic kidney disease, or unspecified chronic kidney disease: Secondary | ICD-10-CM | POA: Diagnosis present

## 2019-03-03 DIAGNOSIS — N183 Chronic kidney disease, stage 3 (moderate): Secondary | ICD-10-CM | POA: Diagnosis present

## 2019-03-03 DIAGNOSIS — Z1159 Encounter for screening for other viral diseases: Secondary | ICD-10-CM | POA: Diagnosis not present

## 2019-03-03 LAB — LACTIC ACID, PLASMA: Lactic Acid, Venous: 1.2 mmol/L (ref 0.5–1.9)

## 2019-03-03 LAB — CBC WITH DIFFERENTIAL/PLATELET
Abs Immature Granulocytes: 0.05 10*3/uL (ref 0.00–0.07)
Basophils Absolute: 0.1 10*3/uL (ref 0.0–0.1)
Basophils Relative: 1 %
Eosinophils Absolute: 0.2 10*3/uL (ref 0.0–0.5)
Eosinophils Relative: 2 %
HCT: 28.9 % — ABNORMAL LOW (ref 39.0–52.0)
Hemoglobin: 9.2 g/dL — ABNORMAL LOW (ref 13.0–17.0)
Immature Granulocytes: 1 %
Lymphocytes Relative: 1 %
Lymphs Abs: 0.1 10*3/uL — ABNORMAL LOW (ref 0.7–4.0)
MCH: 28.9 pg (ref 26.0–34.0)
MCHC: 31.8 g/dL (ref 30.0–36.0)
MCV: 90.9 fL (ref 80.0–100.0)
Monocytes Absolute: 0.9 10*3/uL (ref 0.1–1.0)
Monocytes Relative: 9 %
Neutro Abs: 8.7 10*3/uL — ABNORMAL HIGH (ref 1.7–7.7)
Neutrophils Relative %: 86 %
Platelets: 186 10*3/uL (ref 150–400)
RBC: 3.18 MIL/uL — ABNORMAL LOW (ref 4.22–5.81)
RDW: 17.8 % — ABNORMAL HIGH (ref 11.5–15.5)
WBC: 10 10*3/uL (ref 4.0–10.5)
nRBC: 0 % (ref 0.0–0.2)

## 2019-03-03 LAB — PROTIME-INR
INR: 1.3 — ABNORMAL HIGH (ref 0.8–1.2)
Prothrombin Time: 16 seconds — ABNORMAL HIGH (ref 11.4–15.2)

## 2019-03-03 LAB — COMPREHENSIVE METABOLIC PANEL
ALT: 18 U/L (ref 0–44)
AST: 26 U/L (ref 15–41)
Albumin: 2.6 g/dL — ABNORMAL LOW (ref 3.5–5.0)
Alkaline Phosphatase: 59 U/L (ref 38–126)
Anion gap: 8 (ref 5–15)
BUN: 47 mg/dL — ABNORMAL HIGH (ref 8–23)
CO2: 13 mmol/L — ABNORMAL LOW (ref 22–32)
Calcium: 8.4 mg/dL — ABNORMAL LOW (ref 8.9–10.3)
Chloride: 111 mmol/L (ref 98–111)
Creatinine, Ser: 2.03 mg/dL — ABNORMAL HIGH (ref 0.61–1.24)
GFR calc Af Amer: 33 mL/min — ABNORMAL LOW (ref >60)
GFR calc non Af Amer: 29 mL/min — ABNORMAL LOW (ref >60)
Glucose, Bld: 99 mg/dL (ref 70–99)
Potassium: 3.9 mmol/L (ref 3.5–5.1)
Sodium: 132 mmol/L — ABNORMAL LOW (ref 135–145)
Total Bilirubin: 0.7 mg/dL (ref 0.3–1.2)
Total Protein: 7.3 g/dL (ref 6.5–8.1)

## 2019-03-03 LAB — URINALYSIS, ROUTINE W REFLEX MICROSCOPIC
Bacteria, UA: NONE SEEN
Bilirubin Urine: NEGATIVE
Glucose, UA: NEGATIVE mg/dL
Ketones, ur: NEGATIVE mg/dL
Leukocytes,Ua: NEGATIVE
Nitrite: NEGATIVE
Protein, ur: 100 mg/dL — AB
Specific Gravity, Urine: 1.013 (ref 1.005–1.030)
pH: 5 (ref 5.0–8.0)

## 2019-03-03 LAB — BLOOD GAS, ARTERIAL
Acid-base deficit: 10.1 mmol/L — ABNORMAL HIGH (ref 0.0–2.0)
Bicarbonate: 12.4 mmol/L — ABNORMAL LOW (ref 20.0–28.0)
Drawn by: 225631
FIO2: 21
O2 Saturation: 97.5 %
Patient temperature: 98.4
pCO2 arterial: 19.4 mmHg — CL (ref 32.0–48.0)
pH, Arterial: 7.421 (ref 7.350–7.450)
pO2, Arterial: 104 mmHg (ref 83.0–108.0)

## 2019-03-03 LAB — APTT: aPTT: 29 seconds (ref 24–36)

## 2019-03-03 LAB — BASIC METABOLIC PANEL
Anion gap: 9 (ref 5–15)
BUN: 41 mg/dL — ABNORMAL HIGH (ref 8–23)
CO2: 16 mmol/L — ABNORMAL LOW (ref 22–32)
Calcium: 8.1 mg/dL — ABNORMAL LOW (ref 8.9–10.3)
Chloride: 112 mmol/L — ABNORMAL HIGH (ref 98–111)
Creatinine, Ser: 1.89 mg/dL — ABNORMAL HIGH (ref 0.61–1.24)
GFR calc Af Amer: 36 mL/min — ABNORMAL LOW (ref >60)
GFR calc non Af Amer: 31 mL/min — ABNORMAL LOW (ref >60)
Glucose, Bld: 134 mg/dL — ABNORMAL HIGH (ref 70–99)
Potassium: 3.9 mmol/L (ref 3.5–5.1)
Sodium: 137 mmol/L (ref 135–145)

## 2019-03-03 LAB — SARS CORONAVIRUS 2 BY RT PCR (HOSPITAL ORDER, PERFORMED IN ~~LOC~~ HOSPITAL LAB): SARS Coronavirus 2: NEGATIVE

## 2019-03-03 LAB — HIV ANTIBODY (ROUTINE TESTING W REFLEX): HIV Screen 4th Generation wRfx: NONREACTIVE

## 2019-03-03 MED ORDER — HEPARIN SODIUM (PORCINE) 5000 UNIT/ML IJ SOLN
5000.0000 [IU] | Freq: Three times a day (TID) | INTRAMUSCULAR | Status: DC
  Administered 2019-03-03 – 2019-03-05 (×6): 5000 [IU] via SUBCUTANEOUS
  Filled 2019-03-03 (×7): qty 1

## 2019-03-03 MED ORDER — DM-GUAIFENESIN ER 30-600 MG PO TB12
1.0000 | ORAL_TABLET | Freq: Two times a day (BID) | ORAL | Status: DC
  Administered 2019-03-03 – 2019-03-05 (×5): 1 via ORAL
  Filled 2019-03-03 (×5): qty 1

## 2019-03-03 MED ORDER — STERILE WATER FOR INJECTION IV SOLN
INTRAVENOUS | Status: DC
  Administered 2019-03-03: 13:00:00 via INTRAVENOUS
  Filled 2019-03-03: qty 9.71

## 2019-03-03 MED ORDER — SODIUM CHLORIDE 0.9 % IV SOLN
INTRAVENOUS | Status: DC | PRN
  Administered 2019-03-03: 500 mL via INTRAVENOUS

## 2019-03-03 MED ORDER — SODIUM CHLORIDE 0.9 % IV BOLUS
500.0000 mL | Freq: Once | INTRAVENOUS | Status: AC
  Administered 2019-03-03: 500 mL via INTRAVENOUS

## 2019-03-03 NOTE — Progress Notes (Signed)
Chart reviewed. Pt is from [place]. Unit [facility] CM/CSW will follow for possible placement. Jonnie Finner RN CCM Case Mgmt phone [phone]

## 2019-03-03 NOTE — ED Notes (Signed)
ED TO INPATIENT HANDOFF REPORT  Name/Age/Gender Christian Burns 85 y.o. male  Code Status    Code Status Orders  (From admission, onward)         Start     Ordered   03/03/19 0655  Do not attempt resuscitation (DNR)  Continuous    Question Answer Comment  In the event of cardiac or respiratory ARREST Do not call a "code blue"   In the event of cardiac or respiratory ARREST Do not perform Intubation, CPR, defibrillation or ACLS   In the event of cardiac or respiratory ARREST Use medication by any route, position, wound care, and other measures to relive pain and suffering. May use oxygen, suction and manual treatment of airway obstruction as needed for comfort.      03/03/19 0655        Code Status History    Date Active Date Inactive Code Status Order ID Comments User Context   10/06/2018 2129 10/10/2018 1422 Full Code [id number]  Allie Bossier, MD ED   09/01/2018 2244 09/02/2018 1446 Full Code [id number]  Macie Burows Inpatient   Advance Care Planning Activity    Advance Directive Documentation     Most Recent Value  Type of Advance Directive  Healthcare Power of Attorney, Living will  Pre-existing out of facility DNR order (yellow form or pink MOST form)  -  "MOST" Form in Place?  -      Home/SNF/Other Nursing Home  Chief Complaint Fever  Level of Care/Admitting Diagnosis ED Disposition    ED Disposition Condition [place]: [place] [100102]  Level of Care: Med-Surg [16]  Covid Evaluation: Confirmed COVID Negative  Diagnosis: Community acquired pneumonia [867619]  Admitting Physician: Bernadette Hoit [[id number]]  Attending Physician: Bernadette Hoit [[id number]]  Estimated length of stay: 3 - 4 days  Certification:: I certify this patient will need inpatient services for at least 2 midnights  PT Class (Do Not Modify): Inpatient [101]  PT Acc Code (Do Not Modify): Private [1]       Medical History Past Medical  History:  Diagnosis Date  . CKD (chronic kidney disease) 07/06/2018   CKD stage III  . Goals of care, counseling/discussion 02/04/2019  . History of radiation therapy 06/17/13-07/06/13   35 gray to upper lumbar spine  . 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 Hypertension   . Inguinal hernia   . Multiple myeloma [facility]) Sept 2010  . Multiple myeloma in relapse ([place]) 04/05/2016  . Radiation 09/26/15-10/20/15   lower thoracic spine, upper lumbar spine 30 gray    Allergies Allergies  Allergen Reactions  . Sulfa Antibiotics Itching  . Sulfasalazine Itching    IV Location/Drains/Wounds Patient Lines/Drains/Airways Status   Active Line/Drains/Airways    Name:   Placement date:   Placement time:   Site:   Days:   Peripheral IV 03/03/19 Left Forearm   03/03/19    0054    Forearm   less than 1   Incision (Closed) 09/01/18 Hip Right   09/01/18    1840     183          Labs/Imaging Results for orders placed or performed during the hospital encounter of 03/02/19 (from the past 48 hour(s))  Blood Culture (routine x 2)     Status: None (Preliminary result)   Collection Time: 03/02/19 12:55 AM   Specimen: BLOOD LEFT FOREARM  Result Value Ref Range   Specimen Description  BLOOD LEFT FOREARM Performed at [facility], [place] [address], [place], [place] [address]    Special Requests      BOTTLES DRAWN AEROBIC AND ANAEROBIC Blood Culture results may not be optimal due to an excessive volume of blood received in culture bottles Performed at [facility] [address] [place], [place] [address]    Culture PENDING    Report Status PENDING   Comprehensive metabolic panel     Status: Abnormal   Collection Time: 03/02/19 11:42 PM  Result Value Ref Range   Sodium 132 (L) 135 - 145 mmol/L   Potassium 3.9 3.5 - 5.1 mmol/L   Chloride 111 98 - 111 mmol/L   CO2 13 (L) 22 - 32 mmol/L   Glucose, Bld 99 70 - 99 mg/dL   BUN 47 (H) 8 - 23 mg/dL   Creatinine, Ser  2.03 (H) 0.61 - 1.24 mg/dL   Calcium 8.4 (L) 8.9 - 10.3 mg/dL   Total Protein 7.3 6.5 - 8.1 g/dL   Albumin 2.6 (L) 3.5 - 5.0 g/dL   AST 26 15 - 41 U/L   ALT 18 0 - 44 U/L   Alkaline Phosphatase 59 38 - 126 U/L   Total Bilirubin 0.7 0.3 - 1.2 mg/dL   GFR calc non Af Amer 29 (L) >60 mL/min   GFR calc Af Amer 33 (L) >60 mL/min   Anion gap 8 5 - 15    Comment: Performed at [facility], [place] [address], [place], [place] [address]  CBC WITH DIFFERENTIAL     Status: Abnormal   Collection Time: 03/02/19 11:42 PM  Result Value Ref Range   WBC 10.0 4.0 - 10.5 K/uL   RBC 3.18 (L) 4.22 - 5.81 MIL/uL   Hemoglobin 9.2 (L) 13.0 - 17.0 g/dL   HCT 28.9 (L) 39.0 - 52.0 %   MCV 90.9 80.0 - 100.0 fL   MCH 28.9 26.0 - 34.0 pg   MCHC 31.8 30.0 - 36.0 g/dL   RDW 17.8 (H) 11.5 - 15.5 %   Platelets 186 150 - 400 K/uL   nRBC 0.0 0.0 - 0.2 %   Neutrophils Relative % 86 %   Neutro Abs 8.7 (H) 1.7 - 7.7 K/uL   Lymphocytes Relative 1 %   Lymphs Abs 0.1 (L) 0.7 - 4.0 K/uL   Monocytes Relative 9 %   Monocytes Absolute 0.9 0.1 - 1.0 K/uL   Eosinophils Relative 2 %   Eosinophils Absolute 0.2 0.0 - 0.5 K/uL   Basophils Relative 1 %   Basophils Absolute 0.1 0.0 - 0.1 K/uL   Immature Granulocytes 1 %   Abs Immature Granulocytes 0.05 0.00 - 0.07 K/uL    Comment: Performed at [facility], [place] [address], [place], [place] [address]  APTT     Status: None   Collection Time: 03/02/19 11:42 PM  Result Value Ref Range   aPTT 29 24 - 36 seconds    Comment: Performed at [facility], [place] [address] [place], [place] [address]  Protime-INR     Status: Abnormal   Collection Time: 03/02/19 11:42 PM  Result Value Ref Range   Prothrombin Time 16.0 (H) 11.4 - 15.2 seconds   INR 1.3 (H) 0.8 - 1.2    Comment: (NOTE) INR goal varies based on device and disease states. Performed at [facility], [place] [address], [place], [place] [address]    Urinalysis, Routine w reflex  microscopic     Status: Abnormal   Collection Time: 03/02/19 11:42 PM  Result Value Ref Range   Color, Urine YELLOW YELLOW   APPearance CLEAR CLEAR   Specific Gravity, Urine 1.013 1.005 - 1.030   pH 5.0 5.0 - 8.0   Glucose, UA NEGATIVE NEGATIVE mg/dL   Hgb urine dipstick MODERATE (A) NEGATIVE   Bilirubin Urine NEGATIVE NEGATIVE   Ketones, ur NEGATIVE NEGATIVE mg/dL   Protein, ur 100 (A) NEGATIVE mg/dL   Nitrite NEGATIVE NEGATIVE   Leukocytes,Ua NEGATIVE NEGATIVE   RBC / HPF 6-10 0 - 5 RBC/hpf   WBC, UA 0-5 0 - 5 WBC/hpf   Bacteria, UA NONE SEEN NONE SEEN   Mucus PRESENT    Amorphous Crystal PRESENT     Comment: Performed at [facility], [place] [address] [place], [place] [address]  SARS Coronavirus 2 (CEPHEID- Performed in [place] hospital lab), Hosp Order     Status: None   Collection Time: 03/03/19 12:11 AM   Specimen: Nasopharyngeal Swab  Result Value Ref Range   SARS Coronavirus 2 NEGATIVE NEGATIVE    Comment: (NOTE) If result is NEGATIVE SARS-CoV-2 target nucleic acids are NOT DETECTED. The SARS-CoV-2 RNA is generally detectable in upper and lower  respiratory specimens during the acute phase of infection. The lowest  concentration of SARS-CoV-2 viral copies this assay can detect is 250  copies / mL. A negative result does not preclude SARS-CoV-2 infection  and should not be used as the sole basis for treatment or other  patient management decisions.  A negative result may occur with  improper specimen collection / handling, submission of specimen other  than nasopharyngeal swab, presence of viral mutation(s) within the  areas targeted by this assay, and inadequate number of viral copies  (<250 copies / mL). A negative result must be combined with clinical  observations, patient history, and epidemiological information. If result is POSITIVE SARS-CoV-2 target nucleic acids are DETECTED. The SARS-CoV-2 RNA is generally  detectable in upper and lower  respiratory specimens dur ing the acute phase of infection.  Positive  results are indicative of active infection with SARS-CoV-2.  Clinical  correlation with patient history and other diagnostic information is  necessary to determine patient infection status.  Positive results do  not rule out bacterial infection or co-infection with other viruses. If result is PRESUMPTIVE POSTIVE SARS-CoV-2 nucleic acids MAY BE PRESENT.   A presumptive positive result was obtained on the submitted specimen  and confirmed on repeat testing.  While 2019 novel coronavirus  (SARS-CoV-2) nucleic acids may be present in the submitted sample  additional confirmatory testing may be necessary for epidemiological  and / or clinical management purposes  to differentiate between  SARS-CoV-2 and other Sarbecovirus currently known to infect humans.  If clinically indicated additional testing with an alternate test  methodology [phone]) is advised. The SARS-CoV-2 RNA is generally  detectable in upper and lower respiratory sp ecimens during the acute  phase of infection. The expected result is Negative. Fact Sheet for Patients:  StrictlyIdeas.no Fact Sheet for Healthcare Providers: BankingDealers.co.za This test is not yet approved or cleared by the [place] FDA and has been authorized for detection and/or diagnosis of SARS-CoV-2 by FDA under an Emergency Use Authorization (EUA).  This EUA will remain in effect (meaning this test can be used) for the duration of the COVID-19 declaration under Section 564(b)(1) of the Act, 21 U.S.C. section 360bbb-3(b)(1), unless the authorization is terminated or revoked  sooner. Performed at [facility], [place] [address] [place], [place] [address]   Lactic acid, plasma     Status: None   Collection Time: 03/03/19 12:46 AM  Result Value Ref Range   Lactic Acid, Venous 1.2 0.5 -  1.9 mmol/L    Comment: Performed at [facility], [place] [address], [place], [place] [address]  HIV antibody (Routine Screening)     Status: None   Collection Time: 03/03/19 12:46 AM  Result Value Ref Range   HIV Screen 4th Generation wRfx Non Reactive Non Reactive    Comment: (NOTE) Performed At: [facility] [place], [place] [id number] Rush Farmer MD [id number]   Blood gas, arterial (at [facility])     Status: Abnormal   Collection Time: 03/03/19  2:53 AM  Result Value Ref Range   FIO2 21.00    pH, Arterial 7.421 7.350 - 7.450   pCO2 arterial 19.4 (LL) 32.0 - 48.0 mmHg    Comment: CRITICAL RESULT CALLED TO, READ BACK BY AND VERIFIED WITH: DR. Ezequiel Essex MD AT [facility] BY PATRICK SWEENEY RRT, RCP ON 03/03/2019    pO2, Arterial 104 83.0 - 108.0 mmHg   Bicarbonate 12.4 (L) 20.0 - 28.0 mmol/L   Acid-base deficit 10.1 (H) 0.0 - 2.0 mmol/L   O2 Saturation 97.5 %   Patient temperature 98.4    Collection site RIGHT RADIAL    Drawn by [id number]    Sample type ARTERIAL DRAW    Allens test (pass/fail) PASS PASS    Comment: Performed at [place] [address] [place], [place] [address]   Dg Chest Port 1 View  Result Date: 03/03/2019 CLINICAL DATA:  85 year old male with fever and shortness of breath. EXAM: PORTABLE CHEST 1 VIEW COMPARISON:  Chest radiograph dated 10/06/2018 FINDINGS: Areas of hazy density at the left lung base may represent pneumonia. Clinical correlation is recommended. The right lung is clear. No pneumothorax. The cardiac silhouette is within normal limits. No acute osseous pathology. IMPRESSION: Left lung base hazy density may represent pneumonia. Clinical correlation is recommended. Electronically Signed   By: Anner Crete M.D.   On: 03/03/2019 00:40    Pending Labs Unresulted Labs (From admission, onward)    Start     Ordered   03/04/19 0500  Comprehensive metabolic panel  Tomorrow morning,   R      03/03/19 0655   03/04/19 0500  CBC WITH DIFFERENTIAL  Tomorrow morning,   R     03/03/19 0655   03/03/19 4128  Basic metabolic panel  Once-Timed,   STAT     03/03/19 1209   03/02/19 2342  Blood Culture (routine x 2)  BLOOD CULTURE X 2,   STAT     03/02/19 2344   03/02/19 2342  Urine culture  ONCE - STAT,   STAT     03/02/19 2344          Vitals/Pain Today's Vitals   03/03/19 1530 03/03/19 1600 03/03/19 1630 03/03/19 1700  BP: 135/74 133/67 126/66 135/74  Pulse: 79 79 79 76  Resp:    16  Temp:      TempSrc:      SpO2: 98% 99% 98% 98%  Weight:      Height:      PainSc:        Isolation Precautions No active isolations  Medications Medications  cefTRIAXone (ROCEPHIN) 2 g in sodium chloride 0.9 % 100 mL IVPB (  0 g Intravenous Stopped 03/03/19 0156)  azithromycin (ZITHROMAX) 500 mg in sodium chloride 0.9 % 250 mL IVPB (0 mg Intravenous Stopped 03/03/19 0310)  0.9 %  sodium chloride infusion ( Intravenous Stopped 03/03/19 1305)  heparin injection 5,000 Units (has no administration in time range)  dextromethorphan-guaiFENesin (MUCINEX DM) 30-600 MG per 12 hr tablet 1 tablet (1 tablet Oral Given 03/03/19 1304)  sodium chloride 0.225 % with sodium bicarbonate 100 mEq infusion ( Intravenous New Bag/Given 03/03/19 1305)  acetaminophen (TYLENOL) tablet 1,000 mg (1,000 mg Oral Given 03/03/19 0040)  sodium chloride 0.9 % bolus 500 mL (0 mLs Intravenous Stopped 03/03/19 0457)    Mobility walks

## 2019-03-03 NOTE — H&P (Signed)
History and Physical  Christian Burns SWH:[id number] DOB: 01/04/1930 DOA: 03/02/2019  Referring physician: Joline Maxcy, PA-C PCP: Leanna Battles, MD  Outpatient Specialists: Marin Olp (hematology-oncologist) Patient coming from:[facility]  Chief Complaint: Generalized weakness   HPI: Christian Burns is an 85 y.o. male with medical history significant for multiple myeloma who presents to the emergency department from [facility] with complaint of generalized weakness which started last night prior to presenting to the ED.  Patient complained of feeling dizzy while ambulating with his walker and that he had a subsequent fall on his knees, patient denies hitting his head and denies seeing, headache, nausea or vomiting.  He was able to get up using his walker as a support.  He reports some abdominal pain earlier in the day that self resolved.  Patient shares room with his wife at the same facility and wife presents with similar weakness.  He denies chest pain, shortness of breath, nausea, vomiting or abdominal pain.  ED Course: In the emergency department, patient was noted to be febrile with a temperature 102.28F, he was tachycardic and tachypneic.  Work-up in the ED showed normocytic anemia, hyponatremia and elevated BUN/creatinine 47/2.0.  ABG showed pH 7.41, PCO2 19.4, PO2 114 and bicarb of 12.4 on room air. SARS coronavirus 2 was negative.  Chest x-ray showed left lung base is a density suspected to be pneumonia.  Tylenol was given.  IV antibiotics was started.  Hospitalist was asked to admit patient for further evaluation and management.  Review of Systems: Constitutional: Generalized weakness and fever.   HENT: Negative for ear pain and sore throat.   Eyes: Negative for pain and visual disturbance.  Respiratory: Negative for cough, chest tightness and shortness of breath.   Cardiovascular: Negative for chest pain and palpitations.  Gastrointestinal: Negative for abdominal  pain and vomiting.  Endocrine: Negative for polyphagia and polyuria.  Genitourinary: Negative for decreased urine volume, dysuria.  Musculoskeletal: Negative for arthralgias and back pain.  Skin: Negative for color change and rash.  Allergic/Immunologic: Negative for immunocompromised state.  Neurological: Negative for tremors, syncope, speech difficulty, weakness, light-headedness and headaches.  Hematological: Does not bruise/bleed easily.    Past Medical History:  Diagnosis Date  . CKD (chronic kidney disease) 07/06/2018   CKD stage III  . Goals of care, counseling/discussion 02/04/2019  . History of radiation therapy 06/17/13-07/06/13   35 gray to upper lumbar spine  . 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 Hypertension   . Inguinal hernia   . Multiple myeloma [facility]) Sept 2010  . Multiple myeloma in relapse ([place]) 04/05/2016  . Radiation 09/26/15-10/20/15   lower thoracic spine, upper lumbar spine 30 gray   Past Surgical History:  Procedure Laterality Date  . COLONOSCOPY    . HIP PINNING,CANNULATED Right 09/01/2018   Procedure: [facility] NAIL HIP PINNING;  Surgeon: Melrose Nakayama, MD;  Location: [facility];  Service: Orthopedics;  Laterality: Right;  . TEE WITHOUT CARDIOVERSION N/A 10/09/2018   Procedure: TRANSESOPHAGEAL ECHOCARDIOGRAM (TEE);  Surgeon: Acie Fredrickson Wonda Cheng, MD;  Location: [facility] ENDOSCOPY;  Service: Cardiovascular;  Laterality: N/A;  . TONSILLECTOMY      Social History:  reports that he has never smoked. He has never used smokeless tobacco. He reports current alcohol use of about 4.0 standard drinks of alcohol per week. He reports that he does not use drugs.   Allergies  Allergen Reactions  . Sulfa Antibiotics Itching  . Sulfasalazine Itching    History reviewed. No pertinent family history.  Prior to Admission medications   Medication Sig Start Date End Date Taking? Authorizing Provider  amLODipine-benazepril (LOTREL) 5-10 MG capsule TAKE ONE CAPSULE EACH DAY  Patient taking differently: Take 1 capsule by mouth daily.  03/01/19  Yes Ennever, Rudell Cobb, MD  dexamethasone (DECADRON) 4 MG tablet Take 5 tablets (65m) on days 1,8,15 and 22 of chemotherapy. Repeat every 28 days 02/10/19  Yes Ennever, PRudell Cobb MD  dronabinol (MARINOL) 5 MG capsule Take 1 capsule (5 mg total) by mouth 2 (two) times daily before a meal. 12/31/18  Yes Ennever, PRudell Cobb MD  finasteride (PROSCAR) 5 MG tablet Take 5 mg by mouth daily.   Yes [provider]  lidocaine-prilocaine (EMLA) cream Apply to affected area once 02/10/19  Yes Ennever, PRudell Cobb MD  LORazepam (ATIVAN) 0.5 MG tablet Take 1 tablet (0.5 mg total) by mouth every 6 (six) hours as needed (Nausea or vomiting). 02/10/19  Yes EVolanda Napoleon MD  megestrol (MEGACE) 400 MG/10ML suspension Take 15 mLs (600 mg total) by mouth daily. 01/08/19  Yes EVolanda Napoleon MD  Multiple Vitamin (MULTIVITAMIN WITH MINERALS) TABS tablet Take 1 tablet by mouth daily.   Yes [provider]  ondansetron (ZOFRAN) 8 MG tablet Take 1 tablet (8 mg total) by mouth 2 (two) times daily as needed for refractory nausea / vomiting. Start on day 2 after Bendamustine chemotherapy. 02/10/19  Yes EVolanda Napoleon MD  penicillin v potassium (VEETID) 500 MG tablet Take 1 tablet (500 mg total) by mouth 4 (four) times daily. 01/25/19  Yes SCarlyle Basques MD  Probiotic Product (PROBIOTIC PO) Take 1 capsule by mouth daily.   Yes [provider]  prochlorperazine (COMPAZINE) 10 MG tablet Take 1 tablet (10 mg total) by mouth every 6 (six) hours as needed (nausea and vomiting). 02/10/19  Yes EVolanda Napoleon MD  pyridOXINE (VITAMIN B-6) 100 MG tablet Take 100 mg by mouth daily.  09/17/13  Yes EVolanda Napoleon MD  traMADol (ULTRAM) 50 MG tablet TAKE ONE TABLET EVERY EIGHT HOURS AS NEEDED Patient taking differently: Take 50 mg by mouth every 8 (eight) hours as needed for moderate pain.  02/08/19  Yes Ennever, PRudell Cobb MD  vitamin B-12 (CYANOCOBALAMIN)  500 MCG tablet Take 500 mcg by mouth daily.   Yes [provider]  Vitamin D, Ergocalciferol, (DRISDOL) 50000 UNITS CAPS capsule Take 50,000 Units by mouth every Sunday.    Yes [provider]  montelukast (SINGULAIR) 10 MG tablet Take 1 tablet (10 mg total) by mouth at bedtime. Patient not taking: Reported on 01/25/2019 10/29/18   [place], SHolli Humbles NP  POMALYST 2 MG capsule TAKE 1 CAPSULE DAILY. TAKE WITH WATER ON DAYS 1 THROUGH 21. REPEAT EVERY 28 DAYS Patient not taking: Reported on 03/03/2019 01/20/19   EVolanda Napoleon MD    Physical Exam: BP 106/64   Pulse 74   Temp 98.7 F (37.1 C) (Oral)   Resp 14   Ht 5' 10"  (1.778 m)   Wt 63.5 kg   SpO2 97%   BMI 20.09 kg/m   . General: 85y.o. year-old male well developed well nourished in no acute distress.  Alert and oriented x3. .Marland KitchenHEENT: Normocephalic, atraumatic, pupils equal round reactive to light and evaluation. . Cardiovascular: Regular rate and rhythm with no rubs or gallops.  No thyromegaly or JVD noted.  No lower extremity edema. 2/4 pulses in all 4 extremities. .Marland KitchenRespiratory: Tachypnea.  Bilateral rails in lower lobes on auscultation.   .Marland Kitchen  Abdomen: Soft nontender nondistended with normal bowel sounds x4 quadrants. . Muskuloskeletal: No cyanosis, clubbing or edema noted bilaterally . Neuro: CN II-XII intact, strength, sensation, reflexes . Skin: No ulcerative lesions noted or rashes . Psychiatry: Judgement and insight appear normal. Mood is appropriate for condition and setting          Labs on Admission:  Basic Metabolic Panel: Recent Labs  Lab 03/02/19 2342  NA 132*  K 3.9  CL 111  CO2 13*  GLUCOSE 99  BUN 47*  CREATININE 2.03*  CALCIUM 8.4*   Liver Function Tests: Recent Labs  Lab 03/02/19 2342  AST 26  ALT 18  ALKPHOS 59  BILITOT 0.7  PROT 7.3  ALBUMIN 2.6*   No results for input(s): LIPASE, AMYLASE in the last 168 hours. No results for input(s): AMMONIA in the last 168 hours. CBC:  Recent Labs  Lab 03/02/19 2342  WBC 10.0  NEUTROABS 8.7*  HGB 9.2*  HCT 28.9*  MCV 90.9  PLT 186   Cardiac Enzymes: No results for input(s): CKTOTAL, CKMB, CKMBINDEX, TROPONINI in the last 168 hours.  BNP (last 3 results) Recent Labs    10/06/18 2142  BNP 109.5*    ProBNP (last 3 results) No results for input(s): PROBNP in the last 8760 hours.  CBG: No results for input(s): GLUCAP in the last 168 hours.  Radiological Exams on Admission: Dg Chest Port 1 View  Result Date: 03/03/2019 CLINICAL DATA:  85 year old male with fever and shortness of breath. EXAM: PORTABLE CHEST 1 VIEW COMPARISON:  Chest radiograph dated 10/06/2018 FINDINGS: Areas of hazy density at the left lung base may represent pneumonia. Clinical correlation is recommended. The right lung is clear. No pneumothorax. The cardiac silhouette is within normal limits. No acute osseous pathology. IMPRESSION: Left lung base hazy density may represent pneumonia. Clinical correlation is recommended. Electronically Signed   By: Anner Crete M.D.   On: 03/03/2019 00:40    EKG: I independently viewed the EKG done and my findings are as followed: Sinus tachycardia at rate of 115bpm.  Assessment/Plan Present on Admission: . Community acquired pneumonia . Myeloma ([place])  Active Problems:   Myeloma ([place])   Community acquired pneumonia   AKI (acute kidney injury) ([place])   Normocytic anemia  Community acquired pneumonia POA PORT/PSI of  136 points  Indicating 27-28.2% mortality Continue Mucinex, IV ceftriaxone and azithromycin Continue  incentive spirometry, flutter valve Blood culture and sputum culture pending  Acute kidney injury Creatinine on admission= 2.0, baseline creatinine = no prior labs for comparison,  Renally adjust medications, avoid nephrotoxic agents/dehydration/hypotension Continue IV NS  Multiple myeloma Stable, patient follows with Dr. Marin Olp  Chronic normocytic anemia Possible secondary to  above, stable  DVT prophylaxis: Subcu heparin  Code Status: DNR  Family Communication: None at bedside  Disposition Plan: Patient will return to [facility] once clinically improved  Consults called: None  Admission status: Inpatient admission  Bernadette Hoit MD [facility]  If 7PM-7AM, please contact night-coverage [email or web address]  03/03/2019, 7:35 AM

## 2019-03-03 NOTE — Progress Notes (Signed)
PT demonstrated hands on understanding of Flutter device- NPC at this time. 

## 2019-03-03 NOTE — Progress Notes (Signed)
PROGRESS NOTE  Christian Burns FVC:[id number] DOB: 07/21/1930 DOA: 03/02/2019 PCP: Leanna Battles, MD  Brief History   Christian Burns is an 85 y.o. male with medical history significant for multiple myeloma who presents to the emergency department from [place] [facility] with complaint of generalized weakness which started last night prior to presenting to the ED.  Patient complained of feeling dizzy while ambulating with his walker and that he had a subsequent fall on his knees, patient denies hitting his head and denies seeing, headache, nausea or vomiting.  He was able to get up using his walker as a support.  He reports some abdominal pain earlier in the day that self resolved.  Patient shares room with his wife at the same facility and wife presents with similar weakness.  He denies chest pain, shortness of breath, nausea, vomiting or abdominal pain.  ED Course: In the emergency department, patient was noted to be febrile with a temperature 102.53F, he was tachycardic and tachypneic.  Work-up in the ED showed normocytic anemia, hyponatremia and elevated BUN/creatinine 47/2.0.  ABG showed pH 7.41, PCO2 19.4, PO2 114 and bicarb of 12.4 on room air. SARS coronavirus 2 was negative.  Chest x-ray showed left lung base is a density suspected to be pneumonia.  Tylenol was given.  IV antibiotics was started.  Hospitalist was asked to admit patient for further evaluation and management.  Consultants  . None  Procedures  . None  Antibiotics   Anti-infectives (From admission, onward)   Start     Dose/Rate Route Frequency Ordered Stop   03/02/19 2345  cefTRIAXone (ROCEPHIN) 2 g in sodium chloride 0.9 % 100 mL IVPB     2 g 200 mL/hr over 30 Minutes Intravenous Every 24 hours 03/02/19 2344     03/02/19 2345  azithromycin (ZITHROMAX) 500 mg in sodium chloride 0.9 % 250 mL IVPB     500 mg 250 mL/hr over 60 Minutes Intravenous Every 24 hours 03/02/19 2344      .  Marland Kitchen   Subjective  Patient is  resting comfortably. No new complaints.  Objective   Vitals:  Vitals:   03/03/19 1827 03/03/19 1948  BP: (!) 142/76 140/77  Pulse: 77 82  Resp: 16 18  Temp:  98 F (36.7 C)  SpO2: 98% 100%    Exam:  Constitutional:  . Patient is awake, alert, and oriented x 3. No acute distress. Marland Kitchen  Respiratory:  . CTA bilaterally, no no wheezes or rhonchi. Positive for scattered rales.  Marland Kitchen Respiratory effort normal. No retractions or accessory muscle use Cardiovascular:  . RRR, no m/r/g . No LE extremity edema   . Normal pedal pulses Abdomen:  . Abdomen appears normal; no tenderness or masses . No hernias . No HSM Musculoskeletal:  . No cyanosis, clubbing, or edema. Skin:  . No rashes, lesions, ulcers . palpation of skin: no induration or nodules Neurologic:  . CN 2-12 intact . Sensation all 4 extremities intact   I have personally reviewed the following:   Today's Data  . BMP  Scheduled Meds: . dextromethorphan-guaiFENesin  1 tablet Oral BID  . heparin  5,000 Units Subcutaneous Q8H   Continuous Infusions: . sodium chloride Stopped (03/03/19 1305)  . azithromycin Stopped (03/03/19 0310)  . cefTRIAXone (ROCEPHIN)  IV Stopped (03/03/19 0134)  .  sodium bicarbonate infusion 1/4 NS 1000 mL 10 mL/hr at 03/03/19 1305    Active Problems:   Myeloma [facility])   Community acquired pneumonia   AKI (  acute kidney injury) ([place])   Normocytic anemia   LOS: 0 days   A & P   Community acquired pneumonia POA PORT/PSI of  136 points  Indicating 27-28.2% mortality Continue Mucinex, IV ceftriaxone and azithromycin Continue  incentive spirometry, flutter valve Blood culture and sputum culture pending  Acute kidney injury Creatinine on admission= 2.0, baseline creatinine = no prior labs for comparison,  Renally adjust medications, avoid nephrotoxic agents/dehydration/hypotension Continue IV NS  Multiple myeloma Stable, patient follows with Dr. Marin Olp  Chronic normocytic anemia  Possible secondary to above, stable  I have seen and examined this patient myself. I have spent 35 minutes in his evaluation and care.  DVT prophylaxis: Subcutaneous heparin Code Status: DNR Family Communication: None available Disposition Plan: tbc   Ebb Carelock, DO [facility] Direct contact: see [email or web address]  7PM-7AM contact night coverage as above 03/03/2019, 8:56 PM  LOS: 0 days

## 2019-03-04 DIAGNOSIS — R5381 Other malaise: Secondary | ICD-10-CM

## 2019-03-04 LAB — CBC WITH DIFFERENTIAL/PLATELET
Abs Immature Granulocytes: 0.05 10*3/uL (ref 0.00–0.07)
Basophils Absolute: 0 10*3/uL (ref 0.0–0.1)
Basophils Relative: 0 %
Eosinophils Absolute: 0.4 10*3/uL (ref 0.0–0.5)
Eosinophils Relative: 5 %
HCT: 25.7 % — ABNORMAL LOW (ref 39.0–52.0)
Hemoglobin: 8.3 g/dL — ABNORMAL LOW (ref 13.0–17.0)
Immature Granulocytes: 1 %
Lymphocytes Relative: 3 %
Lymphs Abs: 0.2 10*3/uL — ABNORMAL LOW (ref 0.7–4.0)
MCH: 29.9 pg (ref 26.0–34.0)
MCHC: 32.3 g/dL (ref 30.0–36.0)
MCV: 92.4 fL (ref 80.0–100.0)
Monocytes Absolute: 1 10*3/uL (ref 0.1–1.0)
Monocytes Relative: 14 %
Neutro Abs: 5.4 10*3/uL (ref 1.7–7.7)
Neutrophils Relative %: 77 %
Platelets: 160 10*3/uL (ref 150–400)
RBC: 2.78 MIL/uL — ABNORMAL LOW (ref 4.22–5.81)
RDW: 18.4 % — ABNORMAL HIGH (ref 11.5–15.5)
WBC: 7 10*3/uL (ref 4.0–10.5)
nRBC: 0 % (ref 0.0–0.2)

## 2019-03-04 LAB — COMPREHENSIVE METABOLIC PANEL
ALT: 14 U/L (ref 0–44)
AST: 18 U/L (ref 15–41)
Albumin: 2.1 g/dL — ABNORMAL LOW (ref 3.5–5.0)
Alkaline Phosphatase: 46 U/L (ref 38–126)
Anion gap: 5 (ref 5–15)
BUN: 36 mg/dL — ABNORMAL HIGH (ref 8–23)
CO2: 14 mmol/L — ABNORMAL LOW (ref 22–32)
Calcium: 7.8 mg/dL — ABNORMAL LOW (ref 8.9–10.3)
Chloride: 116 mmol/L — ABNORMAL HIGH (ref 98–111)
Creatinine, Ser: 1.68 mg/dL — ABNORMAL HIGH (ref 0.61–1.24)
GFR calc Af Amer: 42 mL/min — ABNORMAL LOW (ref >60)
GFR calc non Af Amer: 36 mL/min — ABNORMAL LOW (ref >60)
Glucose, Bld: 91 mg/dL (ref 70–99)
Potassium: 3.6 mmol/L (ref 3.5–5.1)
Sodium: 135 mmol/L (ref 135–145)
Total Bilirubin: 0.5 mg/dL (ref 0.3–1.2)
Total Protein: 6 g/dL — ABNORMAL LOW (ref 6.5–8.1)

## 2019-03-04 LAB — URINE CULTURE: Culture: NO GROWTH

## 2019-03-04 MED ORDER — POLYVINYL ALCOHOL 1.4 % OP SOLN
1.0000 [drp] | OPHTHALMIC | Status: DC | PRN
  Administered 2019-03-04: 10:00:00 1 [drp] via OPHTHALMIC
  Filled 2019-03-04: qty 15

## 2019-03-04 MED ORDER — AZITHROMYCIN 250 MG PO TABS
500.0000 mg | ORAL_TABLET | Freq: Every day | ORAL | Status: DC
  Administered 2019-03-04: 500 mg via ORAL
  Filled 2019-03-04: qty 2

## 2019-03-04 NOTE — Progress Notes (Addendum)
PROGRESS NOTE  Christian Burns:[id number] DOB: [birth year] DOA: 03/02/2019 PCP: Leanna Battles, MD  Brief History   Christian Burns is an 85 y.o. male with medical history significant for multiple myeloma who presents to the emergency department from [place] [facility] with complaint of generalized weakness which started last night prior to presenting to the ED.  Patient complained of feeling dizzy while ambulating with his walker and that he had a subsequent fall on his knees, patient denies hitting his head and denies seeing, headache, nausea or vomiting.  He was able to get up using his walker as a support.  He reports some abdominal pain earlier in the day that self resolved.  Patient shares room with his wife at the same facility and wife presents with similar weakness.  He denies chest pain, shortness of breath, nausea, vomiting or abdominal pain.   ED Course: In the emergency department, patient was noted to be febrile with a temperature 102.17F, he was tachycardic and tachypneic.  Work-up in the ED showed normocytic anemia, hyponatremia and elevated BUN/creatinine 47/2.0.  ABG showed pH 7.41, PCO2 19.4, PO2 114 and bicarb of 12.4 on room air. SARS coronavirus 2 was negative.  Chest x-ray showed left lung base is a density suspected to be pneumonia.  Tylenol was given.  IV antibiotics was started.  Hospitalist was asked to admit patient for further evaluation and management.  Consultants  None  Procedures  None  Antibiotics   Anti-infectives (From admission, onward)    Start     Dose/Rate Route Frequency Ordered Stop   03/04/19 2200  azithromycin (ZITHROMAX) tablet 500 mg     500 mg Oral Daily at bedtime 03/04/19 1329     03/02/19 2345  cefTRIAXone (ROCEPHIN) 2 g in sodium chloride 0.9 % 100 mL IVPB     2 g 200 mL/hr over 30 Minutes Intravenous Every 24 hours 03/02/19 2344     03/02/19 2345  azithromycin (ZITHROMAX) 500 mg in sodium chloride 0.9 % 250 mL IVPB  Status:   Discontinued     500 mg 250 mL/hr over 60 Minutes Intravenous Every 24 hours 03/02/19 2344 03/04/19 1329        Subjective  Patient is resting comfortably. No new complaints.  Objective   Vitals:  Vitals:   03/04/19 0853 03/04/19 0855  BP: 132/80 (!) 153/73  Pulse: 92 94  Resp: 16 20  Temp: 98.2 F (36.8 C) 98.3 F (36.8 C)  SpO2: 99% 100%    Exam:  Constitutional:  Patient is awake, alert, and oriented x 3. No acute distress.  Respiratory:  CTA bilaterally, no no wheezes or rhonchi. Positive for scattered rales.  Respiratory effort normal. No retractions or accessory muscle use Cardiovascular:  RRR, no m/r/g No LE extremity edema   Normal pedal pulses Abdomen:  Abdomen appears normal; no tenderness or masses No hernias No HSM Musculoskeletal:  No cyanosis, clubbing, or edema. Skin:  No rashes, lesions, ulcers palpation of skin: no induration or nodules Neurologic:  CN 2-12 intact Sensation all 4 extremities intact   I have personally reviewed the following:   Today's Data  BMP  Scheduled Meds:  azithromycin  500 mg Oral QHS   dextromethorphan-guaiFENesin  1 tablet Oral BID   heparin  5,000 Units Subcutaneous Q8H   Continuous Infusions:  sodium chloride Stopped (03/03/19 1305)   cefTRIAXone (ROCEPHIN)  IV Stopped (03/03/19 2338)    sodium bicarbonate infusion 1/4 NS 1000 mL 10 mL/hr at 03/03/19 1305  Active Problems:   Myeloma ([place])   Community acquired pneumonia   AKI (acute kidney injury) ([place])   Normocytic anemia   LOS: 1 day   A & P   Community acquired pneumonia POA: The patient is feeling quite a bit better. He is saturating 100% on room air. PORT/PSI of  136 points  Indicating 27-28.2% mortality. Patient is receiving IV ceftriaxone and azithromycin. He is also receiving Mucinex, incentive spirometry, and flutter valve.  Blood culture and sputum culture have had no growth.  Severe Sepsis: upon presentation to the ED with  tachycardia, tachypnea, fever, pneumonia on CXR, and AKI. Now resolved.   Acute kidney injury: Creatinine on admission= 2.0, baseline creatinine is unknown as there are  no prior labs for comparison. Medications have been renally adjusted.  Nephrotoxic agents/dehydration/hypotension will be avoided. Continue IV NS. Creatinine is 1.68.   Multiple myeloma: Stable, patient follows with Dr. Marin Olp.   Chronic normocytic anemia: Possible secondary to above, stable.  Debility: PT/OT to evaluate and treat.  I have seen and examined this patient myself. I have spent 32 minutes in his evaluation and care.  DVT prophylaxis: Subcutaneous heparin Code Status: DNR Family Communication: None available Disposition Plan: tbc   Orville Widmann, DO [facility] Direct contact: see [email or web address]  7PM-7AM contact night coverage as above 03/04/2019, 5:19 PM  LOS: 0 days

## 2019-03-04 NOTE — Progress Notes (Signed)
PHARMACIST - PHYSICIAN COMMUNICATION DR:  Benny Lennert CONCERNING: Antibiotic IV to Oral Route Change Policy  RECOMMENDATION: This patient is receiving azithromycin  by the intravenous route.  Based on criteria approved by the Pharmacy and Therapeutics Committee, the antibiotic(s) is/are being converted to the equivalent oral dose form(s).   DESCRIPTION: These criteria include:  Patient being treated for a respiratory tract infection, urinary tract infection, cellulitis or clostridium difficile associated diarrhea if on metronidazole  The patient is not neutropenic and does not exhibit a GI malabsorption state  The patient is eating (either orally or via tube) and/or has been taking other orally administered medications for a least 24 hours  The patient is improving clinically and has a Tmax < 100.5  If you have questions about this conversion, please contact the Pharmacy Department  []   [phone] )  [facility] []   [phone] )  [facility]  []   [phone] )  [facility] [x]   [phone] )  [place], PharmD Candidate 03/04/2019 1:31 PM

## 2019-03-05 LAB — CBC WITH DIFFERENTIAL/PLATELET
Abs Immature Granulocytes: 0.04 10*3/uL (ref 0.00–0.07)
Basophils Absolute: 0 10*3/uL (ref 0.0–0.1)
Basophils Relative: 1 %
Eosinophils Absolute: 0.5 10*3/uL (ref 0.0–0.5)
Eosinophils Relative: 9 %
HCT: 26.3 % — ABNORMAL LOW (ref 39.0–52.0)
Hemoglobin: 8.3 g/dL — ABNORMAL LOW (ref 13.0–17.0)
Immature Granulocytes: 1 %
Lymphocytes Relative: 3 %
Lymphs Abs: 0.2 10*3/uL — ABNORMAL LOW (ref 0.7–4.0)
MCH: 29.1 pg (ref 26.0–34.0)
MCHC: 31.6 g/dL (ref 30.0–36.0)
MCV: 92.3 fL (ref 80.0–100.0)
Monocytes Absolute: 0.8 10*3/uL (ref 0.1–1.0)
Monocytes Relative: 16 %
Neutro Abs: 3.7 10*3/uL (ref 1.7–7.7)
Neutrophils Relative %: 70 %
Platelets: 155 10*3/uL (ref 150–400)
RBC: 2.85 MIL/uL — ABNORMAL LOW (ref 4.22–5.81)
RDW: 18 % — ABNORMAL HIGH (ref 11.5–15.5)
WBC: 5.2 10*3/uL (ref 4.0–10.5)
nRBC: 0 % (ref 0.0–0.2)

## 2019-03-05 LAB — BASIC METABOLIC PANEL
Anion gap: 6 (ref 5–15)
BUN: 29 mg/dL — ABNORMAL HIGH (ref 8–23)
CO2: 16 mmol/L — ABNORMAL LOW (ref 22–32)
Calcium: 7.7 mg/dL — ABNORMAL LOW (ref 8.9–10.3)
Chloride: 115 mmol/L — ABNORMAL HIGH (ref 98–111)
Creatinine, Ser: 1.58 mg/dL — ABNORMAL HIGH (ref 0.61–1.24)
GFR calc Af Amer: 45 mL/min — ABNORMAL LOW (ref >60)
GFR calc non Af Amer: 39 mL/min — ABNORMAL LOW (ref >60)
Glucose, Bld: 93 mg/dL (ref 70–99)
Potassium: 3.5 mmol/L (ref 3.5–5.1)
Sodium: 137 mmol/L (ref 135–145)

## 2019-03-05 MED ORDER — AZITHROMYCIN 250 MG PO TABS: ORAL_TABLET | ORAL | 0 refills | Status: AC

## 2019-03-05 MED ORDER — CEFDINIR 300 MG PO CAPS: 300.0000 mg | ORAL_CAPSULE | Freq: Two times a day (BID) | ORAL | 0 refills | Status: AC

## 2019-03-05 MED ORDER — POLYVINYL ALCOHOL 1.4 % OP SOLN: 1.0000 [drp] | OPHTHALMIC | 0 refills | Status: AC | PRN

## 2019-03-05 MED ORDER — DM-GUAIFENESIN ER 30-600 MG PO TB12: 1.0000 | ORAL_TABLET | Freq: Two times a day (BID) | ORAL | 0 refills | Status: DC

## 2019-03-05 NOTE — Evaluation (Signed)
Physical Therapy Evaluation Patient Details Name: Christian Burns MRN: [id number] DOB: 02-09-1930 Today's Date: 03/05/2019   History of Present Illness  Christian Burns is an 85 y.o. male with medical history significant for multiple myeloma who presents to the emergency department from [place] [facility] with complaint of generalized weakness which started last night prior to presenting to the ED.  Patient complained of feeling dizzy while ambulating with his walker and that he had a subsequent fall on his knees  Clinical Impression  Pt presents with some mild balance impairments and hip flexor weakness due to the above diagnosis. Pt resides at ILF and does have a RW to use. Pt is mod Indep with RW on level surfaces and mod indep with transfers. Recommend d/c home per MD with follow up HHPT services to address LE weakness and increasing balance and mobility. Pt is agreeable to use his RW. Pt is d/c from PT services and follow-up with HHPT.    Follow Up Recommendations Home health PT    Equipment Recommendations  None recommended by PT    Recommendations for Other Services       Precautions / Restrictions Precautions Precautions: Fall Restrictions Weight Bearing Restrictions: No      Mobility  Bed Mobility Overal bed mobility: Modified Independent Bed Mobility: Supine to Sit     Supine to sit: Modified independent (Device/Increase time);HOB elevated     General bed mobility comments: min guard for safety, increased time for LE translation to EOB  Transfers Overall transfer level: Modified independent Equipment used: Rolling walker (2 wheeled) Transfers: Sit to/from Stand Sit to Stand: Modified independent (Device/Increase time)         General transfer comment: supervision-min guard for safety  Ambulation/Gait Ambulation/Gait assistance: Modified independent (Device/Increase time) Gait Distance (Feet): 150 Feet Assistive device: Rolling walker (2 wheeled) Gait  Pattern/deviations: Step-through pattern;Decreased stride length Gait velocity: decr   General Gait Details: pt min guard assist without RW some decreased balance noted and sizzoring of LE at times. Pt mod indep. with RW on level surfaces.  Stairs            Wheelchair Mobility    Modified Rankin (Stroke Patients Only)       Balance Overall balance assessment: Mild deficits observed, not formally tested                                           Pertinent Vitals/Pain Pain Assessment: No/denies pain    Home Living Family/patient expects to be discharged to:: Assisted living               Home Equipment: Walker - 4 wheels;Transport chair;Cane - single point;Shower seat;Grab bars - tub/shower Additional Comments: lives with wife in [facility]    Prior Function Level of Independence: Independent with assistive device(s)         Comments: pt uses RW normally at baseline; no A needed for bathing/dressing     Hand Dominance        Extremity/Trunk Assessment   Upper Extremity Assessment Upper Extremity Assessment: Defer to OT evaluation    Lower Extremity Assessment Lower Extremity Assessment: Overall WFL for tasks assessed       Communication   Communication: No difficulties  Cognition Arousal/Alertness: Awake/alert Behavior During Therapy: WFL for tasks assessed/performed Overall Cognitive Status: Within Functional Limits for tasks assessed  General Comments: states that sometimes he experiences lability suspect from meds, and frequently cries without much warning      General Comments General comments (skin integrity, edema, etc.): Pt did have some hip flexor tightness and was unable to do a SLF with reflects weakness. Will recommend HHPT to address.    Exercises     Assessment/Plan    PT Assessment All further PT needs can be met in the next venue of care  PT Problem List  Decreased strength;Decreased mobility;Decreased range of motion;Decreased activity tolerance;Decreased balance       PT Treatment Interventions      PT Goals (Current goals can be found in the Care Plan section)       Frequency     Barriers to discharge        Co-evaluation               AM-PAC PT "6 Clicks" Mobility  Outcome Measure Help needed turning from your back to your side while in a flat bed without using bedrails?: A Little Help needed moving from lying on your back to sitting on the side of a flat bed without using bedrails?: A Little Help needed moving to and from a bed to a chair (including a wheelchair)?: A Little Help needed standing up from a chair using your arms (e.g., wheelchair or bedside chair)?: A Little Help needed to walk in hospital room?: A Little Help needed climbing 3-5 steps with a railing? : A Little 6 Click Score: 18    End of Session Equipment Utilized During Treatment: Gait belt Activity Tolerance: Patient tolerated treatment well;No increased pain Patient left: in chair;with call bell/phone within reach Nurse Communication: Mobility status(mod indep with RW) PT Visit Diagnosis: Difficulty in walking, not elsewhere classified (R26.2)    Time: 2334-3568 PT Time Calculation (min) (ACUTE ONLY): 24 min   Charges:   PT Evaluation $PT Eval Low Complexity: [place], PT  Theodoro Grist [place] 03/05/2019, 12:20 PM

## 2019-03-05 NOTE — Progress Notes (Signed)
Patient family called that there was a mistake about the pharmacy address and phone number. Paged Dr Konrad Penta. About patient and she said she will make the correction . Already communicated with Charge that patient is coming tomorrow to pick up the prescription.

## 2019-03-05 NOTE — Evaluation (Signed)
Occupational Therapy Evaluation Patient Details Name: Christian Burns MRN: [id number] DOB: [birth year] Today's Date: 03/05/2019    History of Present Illness Christian Burns is an 85 y.o. male with medical history significant for multiple myeloma who presents to the emergency department from [place] [facility] with complaint of generalized weakness which started last night prior to presenting to the ED.  Patient complained of feeling dizzy while ambulating with his walker and that he had a subsequent fall on his knees   Clinical Impression   Pt admitted with above diagnoses, decreased activity tolerance limiting ability to engage in BADL at desired ind 2/2 subsequent fall and weakness. PTA pt living at [facility], reports ind with all BADL and lives with wife in apt. Wife is actually currently admitted as well, pt allowed to visit wife on unit. Focused session on household mobility to wife's room at min guard-supervision level of A. Pt proceeded to walk around entire unit, no O2 sat drop. Pt completed toileting at supervision level and mod I peri care after BM. Pt did state recently that when he urinates he also has a BM without awareness, educated on toilet hygiene and using BSC in middle of the night versus urinal for safety and clean up, pt reports ILF has BSC. Pt able to complete LB dressing as well at supervision level of A. No further OT needs identified after eval, OT will sign off. Recommend pt return to ILF.    Follow Up Recommendations  No OT follow up    Equipment Recommendations  Other (comment)(equipment needs met at ILF)    Recommendations for Other Services       Precautions / Restrictions Precautions Precautions: Fall Restrictions Weight Bearing Restrictions: No      Mobility Bed Mobility Overal bed mobility: Modified Independent Bed Mobility: Supine to Sit     Supine to sit: Modified independent (Device/Increase time);HOB elevated     General bed mobility  comments: min guard for safety, increased time for LE translation to EOB  Transfers Overall transfer level: Modified independent Equipment used: Rolling walker (2 wheeled) Transfers: Sit to/from Stand Sit to Stand: Modified independent (Device/Increase time)         General transfer comment: supervision-min guard for safety    Balance Overall balance assessment: Mild deficits observed, not formally tested                                         ADL either performed or assessed with clinical judgement   ADL Overall ADL's : Needs assistance/impaired Eating/Feeding: Independent   Grooming: Modified independent;Standing   Upper Body Bathing: Modified independent;Sitting   Lower Body Bathing: Supervison/ safety;Sit to/from stand;Sitting/lateral leans   Upper Body Dressing : Modified independent;Sitting   Lower Body Dressing: Supervision/safety;Sit to/from stand;Sitting/lateral leans   Toilet Transfer: Supervision/safety;Regular Materials engineer Details (indicate cue type and reason): regular toilet in room Toileting- Clothing Manipulation and Hygiene: Modified independent;Sit to/from stand;Sitting/lateral lean Toileting - Clothing Manipulation Details (indicate cue type and reason): to perform peri care after BM Tub/ Shower Transfer: Supervision/safety;Shower seat;Ambulation;Rolling walker   Functional mobility during ADLs: Supervision/safety;Rolling walker General ADL Comments: pt overall supervision level for safety in BADL, he performed beyond household distances with VSS     Vision Patient Visual Report: No change from baseline       Perception     Praxis      Pertinent  Vitals/Pain Pain Assessment: No/denies pain     Hand Dominance     Extremity/Trunk Assessment Upper Extremity Assessment Upper Extremity Assessment: Defer to OT evaluation   Lower Extremity Assessment Lower Extremity Assessment: Overall WFL for tasks assessed        Communication Communication Communication: No difficulties   Cognition Arousal/Alertness: Awake/alert Behavior During Therapy: WFL for tasks assessed/performed Overall Cognitive Status: Within Functional Limits for tasks assessed                                 General Comments: states that sometimes he experiences lability suspect from meds, and frequently cries without much warning   General Comments  Pt did have some hip flexor tightness and was unable to do a SLF with reflects weakness. Will recommend HHPT to address.    Exercises     Shoulder Instructions      Home Living Family/patient expects to be discharged to:: Assisted living                             Home Equipment: Walker - 4 wheels;Transport chair;Cane - single point;Shower seat;Grab bars - tub/shower   Additional Comments: lives with wife in [facility]      Prior Functioning/Environment Level of Independence: Independent with assistive device(s)        Comments: pt uses RW normally at baseline; no A needed for bathing/dressing        OT Problem List: Decreased knowledge of use of DME or AE;Decreased strength;Decreased activity tolerance;Impaired balance (sitting and/or standing)      OT Treatment/Interventions:      OT Goals(Current goals can be found in the care plan section) Acute Rehab OT Goals Patient Stated Goal: to visit with wife today OT Goal Formulation: With patient Time For Goal Achievement: 03/19/19 Potential to Achieve Goals: Good  OT Frequency:     Barriers to D/C:            Co-evaluation              AM-PAC OT "6 Clicks" Daily Activity     Outcome Measure Help from another person eating meals?: None Help from another person taking care of personal grooming?: None Help from another person toileting, which includes using toliet, bedpan, or urinal?: A Little Help from another person bathing (including washing, rinsing, drying)?: A  Little Help from another person to put on and taking off regular upper body clothing?: None Help from another person to put on and taking off regular lower body clothing?: A Little 6 Click Score: 21   End of Session Equipment Utilized During Treatment: Gait belt;Rolling walker Nurse Communication: Mobility status  Activity Tolerance: Patient tolerated treatment well Patient left: in chair;Other (comment)(in wife's room down hall with RN ok)  OT Visit Diagnosis: Other abnormalities of gait and mobility (R26.89)                Time: 0240-9735 OT Time Calculation (min): 45 min Charges:  OT General Charges $OT Visit: 1 Visit OT Evaluation $OT Eval Low Complexity: 1 Low OT Treatments $Self Care/Home Management : 23-37 mins  Zenovia Jarred, MSOT, OTR/L Behavioral Health OT/ Acute Relief OT [facility] Office: [phone]  Zenovia Jarred 03/05/2019, 12:59 PM

## 2019-03-05 NOTE — Care Management Important Message (Signed)
Important Message  Patient Details IM Letter given to Velva Harman RN to present to the Patient Name: Christian Burns MRN: [id number] Date of Birth: 02-Jan-1930   Medicare Important Message Given:  Yes     Kerin Salen 03/05/2019, 9:55 AM

## 2019-03-06 NOTE — Discharge Summary (Signed)
Physician Discharge Summary  Christian Burns IHK:[id number] DOB: 12-15-1929 DOA: 03/02/2019  PCP: Leanna Battles, MD  Admit date: 03/02/2019 Discharge date: 03/06/2019  Recommendations for Outpatient Follow-up:  1. The patient is to follow up with his PCP in 7-10 days. 2. He is to follow up with Dr. Marin Olp as directed.  3. Home health PT/OT.   Discharge Diagnoses: Principal diagnosis is #1 1. Community acquired pneumonia 2. Multiple myeloma 3. Debility 4. Chronic normocytic anemia 5. Metabolic acidosis  Discharge Condition: Fair Disposition: Home with home health  Diet recommendation: Regular diet.  Filed Weights   03/03/19 0038  Weight: 63.5 kg    History of present illness:  Christian Burns an88 y.o.malewith medical history significant formultiple myeloma [facility] weakness which started last night prior to presenting to the ED. Patient complained of feeling dizzy while ambulating with his walker and that he had a subsequent fall on his knees, patient denies hitting his head and denies seeing, headache, nausea or vomiting. He was able to get up using his walker as a support. He reports some abdominal pain earlier in the day that self resolved. Patientshares roomwith his wife at the same facility and wife presents with similar weakness. He denies chest pain, shortness of breath, nausea, vomiting or abdominal pain.  ED Course: In the emergency department, patient was noted to be febrile with a temperature 102.38F,he was tachycardic and tachypneic. Work-up in the ED showed normocytic anemia, hyponatremia and elevated BUN/creatinine 47/2.0. ABG showed pH 7.41, PCO2 19.4, PO2 114 and bicarb of 12.4 on room air. SARScoronavirus 2 was negative. Chest x-ray showed left lung base is a density suspected to be pneumonia. Tylenol was given. IV antibiotics was started. Hospitalist was  asked to admit patient for further evaluation and management. Creatinine is down from 2.0 to 1.58.  Hospital Course:  The patient was admitted to a medical bed. He was given IV azithromycin and Rocephin. His oxygenation status has improved and he is not requiring O2 by nasal cannula.   Today's assessment: S: The patient is resting comfortably. No new complaints. O: Vitals:  Vitals:   03/05/19 0917 03/05/19 1348  BP: (!) 149/75 (!) 141/79  Pulse: 92 85  Resp: (!) 21 (!) 27  Temp: 98.6 F (37 C) 98 F (36.7 C)  SpO2: 100% 100%   Constitutional:   Patient is awake, alert, and oriented x 3. No acute distress.   Respiratory:   CTA bilaterally, no no wheezes or rhonchi. Positive for scattered rales.   Respiratory effort normal. No retractions or accessory muscle use Cardiovascular:   RRR, no m/r/g  No LE extremity edema    Normal pedal pulses Abdomen:   Abdomen appears normal; no tenderness or masses  No hernias  No HSM Musculoskeletal:   No cyanosis, clubbing, or edema. Skin:   No rashes, lesions, ulcers  palpation of skin: no induration or nodules Neurologic:   CN 2-12 intact  Sensation all 4 extremities intact   Discharge Instructions  Discharge Instructions    Activity as tolerated - No restrictions   Complete by: As directed    Call MD for:  persistant nausea and vomiting   Complete by: As directed    Call MD for:  severe uncontrolled pain   Complete by: As directed    Diet - low sodium heart healthy   Complete by: As directed    Discharge instructions   Complete by: As directed  Follow up with PCP in 7-10 days. Keep appointment with Dr. Marin Olp.   Increase activity slowly   Complete by: As directed      Allergies as of 03/05/2019      Reactions   Sulfa Antibiotics Itching   Sulfasalazine Itching      Medication List    STOP taking these medications   montelukast 10 MG tablet Commonly known as: Singulair   penicillin v  potassium 500 MG tablet Commonly known as: VEETID   Pomalyst 2 MG capsule Generic drug: pomalidomide     TAKE these medications   amLODipine-benazepril 5-10 MG capsule Commonly known as: LOTREL TAKE ONE CAPSULE EACH DAY What changed: See the new instructions.   azithromycin 250 MG tablet Commonly known as: ZITHROMAX Take one by mouth daily.   cefdinir 300 MG capsule Commonly known as: OMNICEF Take 1 capsule (300 mg total) by mouth 2 (two) times daily for 4 days.   dexamethasone 4 MG tablet Commonly known as: DECADRON Take 5 tablets (13m) on days 1,8,15 and 22 of chemotherapy. Repeat every 28 days   dextromethorphan-guaiFENesin 30-600 MG 12hr tablet Commonly known as: MUCINEX DM Take 1 tablet by mouth 2 (two) times daily.   dronabinol 5 MG capsule Commonly known as: MARINOL Take 1 capsule (5 mg total) by mouth 2 (two) times daily before a meal.   finasteride 5 MG tablet Commonly known as: PROSCAR Take 5 mg by mouth daily.   lidocaine-prilocaine cream Commonly known as: EMLA Apply to affected area once   LORazepam 0.5 MG tablet Commonly known as: Ativan Take 1 tablet (0.5 mg total) by mouth every 6 (six) hours as needed (Nausea or vomiting).   megestrol 400 MG/10ML suspension Commonly known as: MEGACE Take 15 mLs (600 mg total) by mouth daily.   multivitamin with minerals Tabs tablet Take 1 tablet by mouth daily.   ondansetron 8 MG tablet Commonly known as: Zofran Take 1 tablet (8 mg total) by mouth 2 (two) times daily as needed for refractory nausea / vomiting. Start on day 2 after Bendamustine chemotherapy.   polyvinyl alcohol 1.4 % ophthalmic solution Commonly known as: LIQUIFILM TEARS Place 1 drop into both eyes as needed for dry eyes.   PROBIOTIC PO Take 1 capsule by mouth daily.   prochlorperazine 10 MG tablet Commonly known as: COMPAZINE Take 1 tablet (10 mg total) by mouth every 6 (six) hours as needed (nausea and vomiting).   pyridOXINE 100  MG tablet Commonly known as: VITAMIN B-6 Take 100 mg by mouth daily.   traMADol 50 MG tablet Commonly known as: ULTRAM TAKE ONE TABLET EVERY EIGHT HOURS AS NEEDED What changed: See the new instructions.   vitamin B-12 500 MCG tablet Commonly known as: CYANOCOBALAMIN Take 500 mcg by mouth daily.   Vitamin D (Ergocalciferol) 1.25 MG (50000 UT) Caps capsule Commonly known as: DRISDOL Take 50,000 Units by mouth every Sunday.      Allergies  Allergen Reactions  . Sulfa Antibiotics Itching  . Sulfasalazine Itching    The results of significant diagnostics from this hospitalization (including imaging, microbiology, ancillary and laboratory) are listed below for reference.    Significant Diagnostic Studies: Dg Chest Port 1 View  Result Date: 03/03/2019 CLINICAL DATA:  85year old male with fever and shortness of breath. EXAM: PORTABLE CHEST 1 VIEW COMPARISON:  Chest radiograph dated 10/06/2018 FINDINGS: Areas of hazy density at the left lung base may represent pneumonia. Clinical correlation is recommended. The right lung is clear. No pneumothorax. The  cardiac silhouette is within normal limits. No acute osseous pathology. IMPRESSION: Left lung base hazy density may represent pneumonia. Clinical correlation is recommended. Electronically Signed   By: Anner Crete M.D.   On: 03/03/2019 00:40    Microbiology: Recent Results (from the past 240 hour(s))  Blood Culture (routine x 2)     Status: None (Preliminary result)   Collection Time: 03/02/19 12:55 AM   Specimen: BLOOD LEFT FOREARM  Result Value Ref Range Status   Specimen Description   Final    BLOOD LEFT FOREARM Performed at [facility], [address], [place], [place] [address]    Special Requests   Final    BOTTLES DRAWN AEROBIC AND ANAEROBIC Blood Culture results may not be optimal due to an excessive volume of blood received in culture bottles Performed at [facility] [address],  [place], [place] [address]    Culture   Final    NO GROWTH 3 DAYS Performed at [facility], [place] [address] [place], [place] [address]    Report Status PENDING  Incomplete  Blood Culture (routine x 2)     Status: None (Preliminary result)   Collection Time: 03/02/19  1:09 AM   Specimen: BLOOD  Result Value Ref Range Status   Specimen Description   Final    BLOOD RIGHT ANTECUBITAL Performed at [facility] [address] [place], [place] [address]    Special Requests   Final    BOTTLES DRAWN AEROBIC AND ANAEROBIC Blood Culture results may not be optimal due to an excessive volume of blood received in culture bottles Performed at [facility] [address] [place], [place] [address]    Culture   Final    NO GROWTH 3 DAYS Performed at [facility], [place] [address], [place], [place] [address]    Report Status PENDING  Incomplete  Urine culture     Status: None   Collection Time: 03/02/19 11:42 PM   Specimen: In/Out Cath Urine  Result Value Ref Range Status   Specimen Description   Final    IN/OUT CATH URINE Performed at [facility] [address], [place], [place] [address]    Special Requests   Final    NONE Performed at [facility], [place] [address], [place], [place] [address]    Culture   Final    NO GROWTH Performed at [facility], [place] [address] [place], [place] [address]    Report Status 03/04/2019 FINAL  Final  SARS Coronavirus 2 (CEPHEID- Performed in [facility] hospital lab), Hosp Order     Status: None   Collection Time: 03/03/19 12:11 AM   Specimen: Nasopharyngeal Swab  Result Value Ref Range Status   SARS Coronavirus 2 NEGATIVE NEGATIVE Final    Comment: (NOTE) If result is NEGATIVE SARS-CoV-2 target nucleic acids are NOT DETECTED. The SARS-CoV-2 RNA is generally detectable in upper and lower  respiratory specimens during the acute phase of infection. The lowest   concentration of SARS-CoV-2 viral copies this assay can detect is 250  copies / mL. A negative result does not preclude SARS-CoV-2 infection  and should not be used as the sole basis for treatment or other  patient management decisions.  A negative result may occur with  improper specimen collection / handling, submission of specimen other  than nasopharyngeal swab, presence of viral mutation(s) within the  areas targeted by this  assay, and inadequate number of viral copies  (<250 copies / mL). A negative result must be combined with clinical  observations, patient history, and epidemiological information. If result is POSITIVE SARS-CoV-2 target nucleic acids are DETECTED. The SARS-CoV-2 RNA is generally detectable in upper and lower  respiratory specimens dur ing the acute phase of infection.  Positive  results are indicative of active infection with SARS-CoV-2.  Clinical  correlation with patient history and other diagnostic information is  necessary to determine patient infection status.  Positive results do  not rule out bacterial infection or co-infection with other viruses. If result is PRESUMPTIVE POSTIVE SARS-CoV-2 nucleic acids MAY BE PRESENT.   A presumptive positive result was obtained on the submitted specimen  and confirmed on repeat testing.  While 2019 novel coronavirus  (SARS-CoV-2) nucleic acids may be present in the submitted sample  additional confirmatory testing may be necessary for epidemiological  and / or clinical management purposes  to differentiate between  SARS-CoV-2 and other Sarbecovirus currently known to infect humans.  If clinically indicated additional testing with an alternate test  methodology [phone]) is advised. The SARS-CoV-2 RNA is generally  detectable in upper and lower respiratory sp ecimens during the acute  phase of infection. The expected result is Negative. Fact Sheet for Patients:  StrictlyIdeas.no Fact Sheet  for Healthcare Providers: BankingDealers.co.za This test is not yet approved or cleared by the [place] FDA and has been authorized for detection and/or diagnosis of SARS-CoV-2 by FDA under an Emergency Use Authorization (EUA).  This EUA will remain in effect (meaning this test can be used) for the duration of the COVID-19 declaration under Section 564(b)(1) of the Act, 21 U.S.C. section 360bbb-3(b)(1), unless the authorization is terminated or revoked sooner. Performed at [facility], [place] [address] [place], [place] [address]      Labs: Basic Metabolic Panel: Recent Labs  Lab 03/02/19 2342 03/03/19 1904 03/04/19 0631 03/05/19 0551  NA 132* 137 135 137  K 3.9 3.9 3.6 3.5  CL 111 112* 116* 115*  CO2 13* 16* 14* 16*  GLUCOSE 99 134* 91 93  BUN 47* 41* 36* 29*  CREATININE 2.03* 1.89* 1.68* 1.58*  CALCIUM 8.4* 8.1* 7.8* 7.7*   Liver Function Tests: Recent Labs  Lab 03/02/19 2342 03/04/19 0631  AST 26 18  ALT 18 14  ALKPHOS 59 46  BILITOT 0.7 0.5  PROT 7.3 6.0*  ALBUMIN 2.6* 2.1*   No results for input(s): LIPASE, AMYLASE in the last 168 hours. No results for input(s): AMMONIA in the last 168 hours. CBC: Recent Labs  Lab 03/02/19 2342 03/04/19 0631 03/05/19 0551  WBC 10.0 7.0 5.2  NEUTROABS 8.7* 5.4 3.7  HGB 9.2* 8.3* 8.3*  HCT 28.9* 25.7* 26.3*  MCV 90.9 92.4 92.3  PLT 186 160 155   Cardiac Enzymes: No results for input(s): CKTOTAL, CKMB, CKMBINDEX, TROPONINI in the last 168 hours. BNP: BNP (last 3 results) Recent Labs    10/06/18 2142  BNP 109.5*    ProBNP (last 3 results) No results for input(s): PROBNP in the last 8760 hours.  CBG: No results for input(s): GLUCAP in the last 168 hours.  Active Problems:   Myeloma ([place])   Community acquired pneumonia   AKI (acute kidney injury) ([place])   Normocytic anemia   Time coordinating discharge: 38 minutes.  Signed:        Chauntae Hults, DO [facility]  03/06/2019, 1:55 PM

## 2019-03-08 ENCOUNTER — Other Ambulatory Visit: Payer: Self-pay | Admitting: *Deleted

## 2019-03-08 ENCOUNTER — Telehealth: Payer: Self-pay | Admitting: Hematology & Oncology

## 2019-03-08 LAB — CULTURE, BLOOD (ROUTINE X 2)
Culture: NO GROWTH
Culture: NO GROWTH

## 2019-03-08 NOTE — Patient Outreach (Signed)
  [facility]) Care Management  03/08/2019  Christian Burns October 10, 1929 [id number]   EMMI-general discharge from [place] Day # 1 Date: Sunday 03/07/19 [place] Reason: Read discharge papers? No  Know who to call about changes in condition? No  Scheduled follow-up? No  Unfilled prescriptions? Yes  Insurance: [name]  [facility] admissions x 2 ED visits x in the last 6 months  Last admission 03/02/19 to 03/06/19 for CAP- d/c home with [place] attempt # 1 A to the patient home number  His wife Romie Minus answers and states Mr Cocuzza is taking a nap at the time of the call. [facility] RN CM left HIPAA compliant voicemail message along with CM's contact info.  Mrs Leahy requested CM attempt to reach her grandson but was not able to recall the number She then requested a call to her son, [name].  Outreach attempt # 1 B to Lillie Fragmin No answer. [facility] RN CM left HIPAA compliant voicemail message along with CM's contact info.    Social: Mr Daft lives at [employer] retirement with his wife, Romie Minus.    Conditions: CAP on 03/02/19 with SARScoronavirus 2 negative, multiple myeloma, debility, chronic normocytic anemia, metabolic acidosis, fall to his knees 03/02/19  DME: walker   Appointments: 03/18/19 1045 labs and to see Dr Marin Olp at 1130  03/19/19 1100 Appointment 04/27/19 [facility] 3 month f/u Dr Baxter Flattery   Plan: [facility] RN CM sent an unsuccessful outreach letter and scheduled this patient for another call attempt within 4 business days   [place] L. Lavina Hamman, RN, BSN, [place] Coordinator Office number [phone] Mobile number [phone]  Main [facility] number [phone] Fax number [phone]

## 2019-03-08 NOTE — Patient Outreach (Signed)
[facility]) Care Management  03/08/2019  CRISTHIAN VANHOOK [birth year] [id number]   Care coordination  [facility] RN CM received a return call from [name], grandson of Mr Verde [facility] RN CM explained to him that [facility] manages the [facility] calls, had spoke with Mrs Romie Minus, left a message for Mr Lynch to return a call to review his EMMI call from Sunday March 07 2019 [name] reports he will have Mr Rhett return a call to [facility] RN CM as [name] confirms Mr Fundora did answer the EMMI call not [name]. [facility] RN CM discussed with [name] that [facility] RN CM had tried to explain this to Mrs Romie Minus   Plan: [facility] RN CM scheduled this patient for another call attempt within 4 business days   [place] L. Lavina Hamman, RN, BSN, [place] Coordinator Office number [phone] Mobile number [phone]  Main [facility] number [phone] Fax number [phone]

## 2019-03-08 NOTE — Telephone Encounter (Signed)
Spoke with patient to confirm 8/6 and 8/7 appts per 7/24 sch msg

## 2019-03-09 ENCOUNTER — Encounter: Payer: Self-pay | Admitting: *Deleted

## 2019-03-09 ENCOUNTER — Other Ambulatory Visit: Payer: Self-pay | Admitting: *Deleted

## 2019-03-09 ENCOUNTER — Other Ambulatory Visit: Payer: Self-pay

## 2019-03-09 DIAGNOSIS — R3121 Asymptomatic microscopic hematuria: Secondary | ICD-10-CM | POA: Diagnosis not present

## 2019-03-09 NOTE — Patient Outreach (Signed)
[facility]) Care Management  03/09/2019  Christian Burns 12-10-1929 [id number]   EMMI-general discharge from [place] Day # 1 Date: Sunday 03/07/19 [place] Reason: Read discharge papers?            No         Know who to call about changes in condition?          No         Scheduled follow-up? No         Unfilled prescriptions?            Yes  Insurance: [name]  [facility] admissions x 2 ED visits x in the last 6 months  Last admission 03/02/19 to 03/06/19 for CAP- d/c home with [place] attempt # 2 A to the patient home number  Again today his wife answered and reports he has gone down to the laundry room. She and [facility] RN CM confirmed RN CM's contact number. [facility] RN CM left HIPAA compliant voicemail message for [name] to return a call   Plan: [facility] RN CM sent an unsuccessful outreach letter 03/08/19 [facility] RN CM left messages on 03/08/19 and 03/09/19  [facility] RN CM scheduled this patient for a final call attempt within 4 business days

## 2019-03-09 NOTE — Patient Outreach (Signed)
[facility]) Care Management  03/09/2019  Christian Burns 08-15-1929 [id number]   EMMI-general discharge from [place] Day # 1 Date: Sunday 03/07/19 [place] Reason: Read discharge papers?            No         Know who to call about changes in condition?          No         Scheduled follow-up? No         Unfilled prescriptions?            Yes  Haematologist medicare   Patient returned a call to [facility] RN CM Patient is able to verify HIPAA Reviewed and addressed EMMI red alert/referral to [facility] with patient    EMMI  Christian Burns reports he did not understand why he was getting the telephone calls. RN CM explained the purpose of EMMI calls and advised patient that there will be further automated EMMI- post discharge calls to assess how the patient is doing following the recent hospitalization Advised the patient that another call may be received from a nurse if any of their responses were abnormal. Patient voiced understanding and was appreciative of f/u call.  [facility] RN CM reviewed the Sunday 03/07/19 EMMI questions Christian Burns confirms he did receive hospital discharge instructions but his grandson, Christian Burns has the hospital discharge sheets and is planning on visiting today to take him to see his urologist  He states he aware of who to call if his condition changes He voices he would call his wife, son, Christian Burns and Christian Burns. CM encouraged him to call EMS in emergency incidences.  He reports his scheduled appointments to see Christian Burns was changed from this week to next Thursday and Friday August 6 202 and March 19 2019 He reports that he has all his medications after d/c He reports his wife, son and grandson assist with making sure he takes his medications correctly. [facility] RN CM reviewed the d/c instructions in Epic. RN CM encouraged him to take his recently ordered antibiotics. CM spoke with his wife to clarify he is to stop the penicillin and to take Zithromax  and omnicef. She voiced understanding   Social: Christian Burns is retired and lives at [employer] at [employer] retirement apartment with his wife, Christian Burns. They have assist from their son, Christian Burns and grandson, Christian Burns. He is independent/assist with his care needs His son and/or grandson assists with transportation to medical appointments  Conditions: CAP on 03/02/19 with SARScoronavirus 2 negative, multiple myeloma, debility, chronic normocytic anemia, metabolic acidosis, fall to his knees 03/02/19 (he reports this is his only fall ever)   DME: walker, grab bars around toilet and in shower, hand held shower base, eyeglasses, hearing aid, cane  Medications: He denies concerns with taking medications as prescribed, affording medications, side effects of medications and questions about medications  Appointments:  Primary care MD Christian Burns reports last contact was about a month ago8/6/20 1045 labs and to see Christian Burns at 1130  03/19/19 1100 Appointment 04/27/19 [facility] 3 month f/u Christian Burns   Advance Directives: He states he and his wife have 16. They have a living will and POA but in future may want to make changes  CM discussed [facility] SW services and encouraged a return call to CM for referral prn   Consent: [facility] RN CM reviewed [facility] services with patient. Patient gave verbal consent  for services.  Plans [facility] RN CM will close case at this time as patient has been assessed and no needs identified/needs resolved.   Pt encouraged to return a call to [place] CM prn  [facility] RN CM discussed the  unsuccessful outreach letter recently sent with [facility] brochure enclosed for review  Routed note to MDs  Joelene Millin L. Lavina Hamman, RN, BSN, [place] Coordinator Office number [phone] Mobile number [phone]  Main [facility] number [phone] Fax number [phone]

## 2019-03-10 ENCOUNTER — Ambulatory Visit: Payer: Medicare HMO

## 2019-03-10 ENCOUNTER — Ambulatory Visit: Payer: Medicare HMO | Admitting: Hematology & Oncology

## 2019-03-10 ENCOUNTER — Other Ambulatory Visit: Payer: Medicare HMO

## 2019-03-11 ENCOUNTER — Ambulatory Visit: Payer: Medicare HMO

## 2019-03-15 ENCOUNTER — Other Ambulatory Visit: Payer: Self-pay | Admitting: Hematology & Oncology

## 2019-03-15 DIAGNOSIS — C9002 Multiple myeloma in relapse: Secondary | ICD-10-CM

## 2019-03-15 DIAGNOSIS — R112 Nausea with vomiting, unspecified: Secondary | ICD-10-CM

## 2019-03-15 DIAGNOSIS — R634 Abnormal weight loss: Secondary | ICD-10-CM

## 2019-03-16 ENCOUNTER — Other Ambulatory Visit: Payer: Self-pay | Admitting: *Deleted

## 2019-03-16 ENCOUNTER — Other Ambulatory Visit: Payer: Self-pay | Admitting: Hematology & Oncology

## 2019-03-16 DIAGNOSIS — R112 Nausea with vomiting, unspecified: Secondary | ICD-10-CM

## 2019-03-16 DIAGNOSIS — T451X5A Adverse effect of antineoplastic and immunosuppressive drugs, initial encounter: Secondary | ICD-10-CM

## 2019-03-16 DIAGNOSIS — R634 Abnormal weight loss: Secondary | ICD-10-CM

## 2019-03-16 DIAGNOSIS — C9002 Multiple myeloma in relapse: Secondary | ICD-10-CM

## 2019-03-18 ENCOUNTER — Other Ambulatory Visit: Payer: Self-pay

## 2019-03-18 ENCOUNTER — Encounter: Payer: Self-pay | Admitting: Hematology & Oncology

## 2019-03-18 ENCOUNTER — Other Ambulatory Visit: Payer: Self-pay | Admitting: Family

## 2019-03-18 ENCOUNTER — Inpatient Hospital Stay (HOSPITAL_BASED_OUTPATIENT_CLINIC_OR_DEPARTMENT_OTHER): Payer: Medicare HMO | Admitting: Hematology & Oncology

## 2019-03-18 ENCOUNTER — Inpatient Hospital Stay: Payer: Medicare HMO | Attending: Hematology & Oncology

## 2019-03-18 ENCOUNTER — Inpatient Hospital Stay: Payer: Medicare HMO

## 2019-03-18 VITALS — BP 160/94 | HR 68 | Temp 97.0°F | Resp 20 | Wt 145.0 lb

## 2019-03-18 DIAGNOSIS — Z79899 Other long term (current) drug therapy: Secondary | ICD-10-CM | POA: Insufficient documentation

## 2019-03-18 DIAGNOSIS — D6481 Anemia due to antineoplastic chemotherapy: Secondary | ICD-10-CM | POA: Insufficient documentation

## 2019-03-18 DIAGNOSIS — N289 Disorder of kidney and ureter, unspecified: Secondary | ICD-10-CM | POA: Insufficient documentation

## 2019-03-18 DIAGNOSIS — R634 Abnormal weight loss: Secondary | ICD-10-CM | POA: Diagnosis not present

## 2019-03-18 DIAGNOSIS — Z923 Personal history of irradiation: Secondary | ICD-10-CM | POA: Insufficient documentation

## 2019-03-18 DIAGNOSIS — Z5111 Encounter for antineoplastic chemotherapy: Secondary | ICD-10-CM | POA: Insufficient documentation

## 2019-03-18 DIAGNOSIS — R300 Dysuria: Secondary | ICD-10-CM | POA: Insufficient documentation

## 2019-03-18 DIAGNOSIS — Z8701 Personal history of pneumonia (recurrent): Secondary | ICD-10-CM | POA: Diagnosis not present

## 2019-03-18 DIAGNOSIS — M2550 Pain in unspecified joint: Secondary | ICD-10-CM | POA: Insufficient documentation

## 2019-03-18 DIAGNOSIS — M791 Myalgia, unspecified site: Secondary | ICD-10-CM | POA: Insufficient documentation

## 2019-03-18 DIAGNOSIS — T451X5A Adverse effect of antineoplastic and immunosuppressive drugs, initial encounter: Secondary | ICD-10-CM | POA: Insufficient documentation

## 2019-03-18 DIAGNOSIS — D649 Anemia, unspecified: Secondary | ICD-10-CM

## 2019-03-18 DIAGNOSIS — C9000 Multiple myeloma not having achieved remission: Secondary | ICD-10-CM | POA: Diagnosis not present

## 2019-03-18 DIAGNOSIS — C9002 Multiple myeloma in relapse: Secondary | ICD-10-CM

## 2019-03-18 DIAGNOSIS — R200 Anesthesia of skin: Secondary | ICD-10-CM | POA: Insufficient documentation

## 2019-03-18 DIAGNOSIS — Z882 Allergy status to sulfonamides status: Secondary | ICD-10-CM | POA: Diagnosis not present

## 2019-03-18 LAB — PREPARE RBC (CROSSMATCH)

## 2019-03-18 LAB — CBC WITH DIFFERENTIAL (CANCER CENTER ONLY)
Abs Immature Granulocytes: 0.04 10*3/uL (ref 0.00–0.07)
Basophils Absolute: 0.1 10*3/uL (ref 0.0–0.1)
Basophils Relative: 1 %
Eosinophils Absolute: 0.6 10*3/uL — ABNORMAL HIGH (ref 0.0–0.5)
Eosinophils Relative: 11 %
HCT: 25.4 % — ABNORMAL LOW (ref 39.0–52.0)
Hemoglobin: 8.1 g/dL — ABNORMAL LOW (ref 13.0–17.0)
Immature Granulocytes: 1 %
Lymphocytes Relative: 11 %
Lymphs Abs: 0.6 10*3/uL — ABNORMAL LOW (ref 0.7–4.0)
MCH: 29.3 pg (ref 26.0–34.0)
MCHC: 31.9 g/dL (ref 30.0–36.0)
MCV: 92 fL (ref 80.0–100.0)
Monocytes Absolute: 1 10*3/uL (ref 0.1–1.0)
Monocytes Relative: 18 %
Neutro Abs: 3.2 10*3/uL (ref 1.7–7.7)
Neutrophils Relative %: 58 %
Platelet Count: 255 10*3/uL (ref 150–400)
RBC: 2.76 MIL/uL — ABNORMAL LOW (ref 4.22–5.81)
RDW: 18.2 % — ABNORMAL HIGH (ref 11.5–15.5)
WBC Count: 5.5 10*3/uL (ref 4.0–10.5)
nRBC: 0 % (ref 0.0–0.2)

## 2019-03-18 LAB — CMP (CANCER CENTER ONLY)
ALT: 13 U/L (ref 0–44)
AST: 16 U/L (ref 15–41)
Albumin: 2.8 g/dL — ABNORMAL LOW (ref 3.5–5.0)
Alkaline Phosphatase: 57 U/L (ref 38–126)
Anion gap: 6 (ref 5–15)
BUN: 31 mg/dL — ABNORMAL HIGH (ref 8–23)
CO2: 18 mmol/L — ABNORMAL LOW (ref 22–32)
Calcium: 7.9 mg/dL — ABNORMAL LOW (ref 8.9–10.3)
Chloride: 112 mmol/L — ABNORMAL HIGH (ref 98–111)
Creatinine: 1.68 mg/dL — ABNORMAL HIGH (ref 0.61–1.24)
GFR, Est AFR Am: 42 mL/min — ABNORMAL LOW (ref >60)
GFR, Estimated: 36 mL/min — ABNORMAL LOW (ref >60)
Glucose, Bld: 81 mg/dL (ref 70–99)
Potassium: 4.3 mmol/L (ref 3.5–5.1)
Sodium: 136 mmol/L (ref 135–145)
Total Bilirubin: 0.5 mg/dL (ref 0.3–1.2)
Total Protein: 6.5 g/dL (ref 6.5–8.1)

## 2019-03-18 LAB — SAMPLE TO BLOOD BANK

## 2019-03-18 LAB — LACTATE DEHYDROGENASE: LDH: 229 U/L — ABNORMAL HIGH (ref 98–192)

## 2019-03-18 MED ORDER — SODIUM CHLORIDE 0.9 % IV SOLN
75.0000 mg/m2 | Freq: Once | INTRAVENOUS | Status: AC
  Administered 2019-03-18: 125 mg via INTRAVENOUS
  Filled 2019-03-18: qty 5

## 2019-03-18 MED ORDER — DEXAMETHASONE SODIUM PHOSPHATE 10 MG/ML IJ SOLN
INTRAMUSCULAR | Status: AC
  Filled 2019-03-18: qty 1

## 2019-03-18 MED ORDER — PALONOSETRON HCL INJECTION 0.25 MG/5ML
INTRAVENOUS | Status: AC
  Filled 2019-03-18: qty 5

## 2019-03-18 MED ORDER — SODIUM CHLORIDE 0.9 % IV SOLN
Freq: Once | INTRAVENOUS | Status: AC
  Administered 2019-03-18: 13:00:00 via INTRAVENOUS
  Filled 2019-03-18: qty 250

## 2019-03-18 MED ORDER — DEXAMETHASONE SODIUM PHOSPHATE 10 MG/ML IJ SOLN
10.0000 mg | Freq: Once | INTRAMUSCULAR | Status: AC
  Administered 2019-03-18: 14:00:00 10 mg via INTRAVENOUS

## 2019-03-18 MED ORDER — PALONOSETRON HCL INJECTION 0.25 MG/5ML
0.2500 mg | Freq: Once | INTRAVENOUS | Status: AC
  Administered 2019-03-18: 14:00:00 0.25 mg via INTRAVENOUS

## 2019-03-18 NOTE — Progress Notes (Signed)
Hematology and Oncology Follow Up Visit  Christian Burns [id number] 1929/12/04 85 y.o. 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 Palliative radiation therapy to right ilium Kyprolis/Cytoxanq 3 week dosing- s/p cycle 24 (Cytoxan restarted on cycle 13) - DC'd due to progression  Current Therapy:   Elotuzomab - started 10/29/2018 -- s/p cycle #1 -- d/c on 12/24/2018 Pomalidomide 2 mg po q day (21 on/7 off) - started 10/29/2018 Daratumumab - start cycle # 1 on 12/31/2018 -- d/c on -02/04/2019 Zometa IV q 4 weeks - on hold Aranesp 300 g subcutaneous as needed for hemoglobin less than 10 Radiation therapy to right femur, 10 fraction --completed 3000 rad on 11/06/2018 Bendamustine/Decadron -- s/p cycle #1 -- started on 02/10/2019   Interim History:  Christian Burns is here today for follow-up.  We did change his chemotherapy.  I think we have one last chance of trying to help him.  He now is on bendamustine/Decadron.  I am not sure if he is going to respond to this.  He was hospitalized a couple weeks ago.  He had pneumonia.  I do not think he was neutropenic.  He has maintained his weight.  I think that his weight will certainly show his how things are going for him.  He says he is having some dark brown stools.  We did do a rectal exam on him.  Rectal exam shows stool that was brown but heme negative.  Right now, I think a transfusion would certainly help him out.  We will go ahead and give him 2 units of blood.  There is been no rashes.  He has had no leg swelling.  He has had no headache.  His last M spike back in June was 2.1 g/dL.  His IgG level was 3000 mg/dL.  His lambda light chain was 102.5 mg/dL.   Overall, I would say his performance status is ECOG 2, at best.   Medications:  Allergies as of 03/18/2019      Reactions   Sulfa Antibiotics Itching   Sulfasalazine Itching      Medication List       Accurate as of March 18, 2019 12:28 PM. If you have any questions, ask your nurse or doctor.        amLODipine-benazepril 5-10 MG capsule Commonly known as: LOTREL TAKE ONE CAPSULE EACH DAY What changed: See the new instructions.   azithromycin 500 MG tablet Commonly known as: ZITHROMAX   dexamethasone 4 MG tablet Commonly known as: DECADRON Take 5 tablets (20mg ) on days 1,8,15 and 22 of chemotherapy. Repeat every 28 days   dextromethorphan-guaiFENesin 30-600 MG 12hr tablet Commonly known as: MUCINEX DM Take 1 tablet by mouth 2 (two) times daily.   dronabinol 5 MG capsule Commonly known as: MARINOL TAKE ONE CAPSULE TWICE A DAY BEFORE A MEAL   finasteride 5 MG tablet Commonly known as: PROSCAR Take 5 mg by mouth daily.   lidocaine-prilocaine cream Commonly known as: EMLA Apply to affected area once   LORazepam 0.5 MG tablet Commonly known as: Ativan Take 1 tablet (0.5 mg total) by mouth every 6 (six) hours as needed (Nausea or vomiting).   megestrol 400 MG/10ML suspension Commonly known as: MEGACE Take 15 mLs (600 mg total)  by mouth daily.   multivitamin with minerals Tabs tablet Take 1 tablet by mouth daily.   ondansetron 8 MG tablet Commonly known as: Zofran Take 1 tablet (8 mg total) by mouth 2 (two) times daily as needed for refractory nausea / vomiting. Start on day 2 after Bendamustine chemotherapy.   polyvinyl alcohol 1.4 % ophthalmic solution Commonly known as: LIQUIFILM TEARS Place 1 drop into both eyes as needed for dry eyes.   PROBIOTIC PO Take 1 capsule by mouth daily.   prochlorperazine 10 MG tablet Commonly known as: COMPAZINE Take 1 tablet (10 mg total) by mouth every 6 (six) hours as needed (nausea and vomiting).   pyridOXINE 100 MG  tablet Commonly known as: VITAMIN B-6 Take 100 mg by mouth daily.   traMADol 50 MG tablet Commonly known as: ULTRAM TAKE ONE TABLET EVERY EIGHT HOURS AS NEEDED What changed: See the new instructions.   vitamin B-12 500 MCG tablet Commonly known as: CYANOCOBALAMIN Take 500 mcg by mouth daily.   Vitamin D (Ergocalciferol) 1.25 MG (50000 UT) Caps capsule Commonly known as: DRISDOL Take 50,000 Units by mouth every Sunday.       Allergies:  Allergies  Allergen Reactions  . Sulfa Antibiotics Itching  . Sulfasalazine Itching    Past Medical History, Surgical history, Social history, and Family History were reviewed and updated.  Review of Systems: Review of Systems  Constitutional: Positive for weight loss.  HENT: Negative.   Eyes: Negative.   Respiratory: Negative.   Cardiovascular: Negative.   Gastrointestinal: Negative.   Genitourinary: Positive for dysuria.  Musculoskeletal: Positive for joint pain and myalgias.  Skin: Negative.   Neurological: Negative.   Endo/Heme/Allergies: Negative.   Psychiatric/Behavioral: Negative.      Physical Exam:  weight is 145 lb (65.8 kg). His oral temperature is 97 F (36.1 C) (abnormal). His blood pressure is 160/94 (abnormal) and his pulse is 68. His respiration is 20 and oxygen saturation is 98%.   Wt Readings from Last 3 Encounters:  03/18/19 145 lb (65.8 kg)  03/03/19 140 lb (63.5 kg)  01/28/19 145 lb (65.8 kg)    Physical Exam Vitals signs reviewed.  HENT:     Head: Normocephalic and atraumatic.  Eyes:     Pupils: Pupils are equal, round, and reactive to light.  Neck:     Musculoskeletal: Normal range of motion.  Cardiovascular:     Rate and Rhythm: Normal rate and regular rhythm.     Heart sounds: Normal heart sounds.  Pulmonary:     Effort: Pulmonary effort is normal.     Breath sounds: Normal breath sounds.  Abdominal:     General: Bowel sounds are normal.     Palpations: Abdomen is soft.  Musculoskeletal:  Normal range of motion.        General: No tenderness or deformity.  Lymphadenopathy:     Cervical: No cervical adenopathy.  Skin:    General: Skin is warm and dry.     Findings: No erythema or rash.  Neurological:     Mental Status: He is alert and oriented to person, place, and time.  Psychiatric:        Behavior: Behavior normal.        Thought Content: Thought content normal.        Judgment: Judgment normal.      Lab Results  Component Value Date   WBC 5.5 03/18/2019   HGB 8.1 (L) 03/18/2019   HCT 25.4 (L) 03/18/2019   MCV  92.0 03/18/2019   PLT 255 03/18/2019   Lab Results  Component Value Date   FERRITIN 125 12/10/2018   IRON 40 (L) 12/10/2018   TIBC 220 12/10/2018   UIBC 180 12/10/2018   IRONPCTSAT 18 (L) 12/10/2018   Lab Results  Component Value Date   RETICCTPCT 1.3 12/10/2018   RBC 2.76 (L) 03/18/2019   Lab Results  Component Value Date   KPAFRELGTCHN 7.4 01/28/2019   LAMBDASER 1,025.3 (H) 01/28/2019   KAPLAMBRATIO 0.01 (L) 01/28/2019   Lab Results  Component Value Date   IGGSERUM 2,934 (H) 01/28/2019   IGGSERUM 3,522 (H) 01/28/2019   IGA 15 (L) 01/28/2019   IGA 12 (L) 01/28/2019   IGMSERUM 14 (L) 01/28/2019   IGMSERUM 15 01/28/2019   Lab Results  Component Value Date   TOTALPROTELP 7.4 01/28/2019   ALBUMINELP 2.9 01/28/2019   A1GS 0.3 01/28/2019   A2GS 1.0 01/28/2019   BETS 0.8 01/28/2019   BETA2SER 0.3 07/28/2015   GAMS 2.3 (H) 01/28/2019   MSPIKE 2.1 (H) 01/28/2019   SPEI Comment 11/26/2018     Chemistry      Component Value Date/Time   NA 136 03/18/2019 1036   NA 146 (H) 08/07/2017 1153   NA 141 01/09/2017 1004   K 4.3 03/18/2019 1036   K 4.6 08/07/2017 1153   K 4.4 01/09/2017 1004   CL 112 (H) 03/18/2019 1036   CL 108 08/07/2017 1153   CO2 18 (L) 03/18/2019 1036   CO2 26 08/07/2017 1153   CO2 22 01/09/2017 1004   BUN 31 (H) 03/18/2019 1036   BUN 24 (H) 08/07/2017 1153   BUN 23.9 01/09/2017 1004   CREATININE 1.68 (H)  03/18/2019 1036   CREATININE 1.56 (H) 05/26/2018 1508   CREATININE 1.5 (H) 01/09/2017 1004      Component Value Date/Time   CALCIUM 7.9 (L) 03/18/2019 1036   CALCIUM 8.8 08/07/2017 1153   CALCIUM 8.8 01/09/2017 1004   ALKPHOS 57 03/18/2019 1036   ALKPHOS 97 (H) 08/07/2017 1153   ALKPHOS 80 01/09/2017 1004   AST 16 03/18/2019 1036   AST 18 01/09/2017 1004   ALT 13 03/18/2019 1036   ALT 20 08/07/2017 1153   ALT 12 01/09/2017 1004   BILITOT 0.5 03/18/2019 1036   BILITOT 0.92 01/09/2017 1004       Impression and Plan: Christian Burns is a very pleasant 85 yo caucasian gentleman with relapsed IgG lambda myeloma.   We will have to see how his light chains and myeloma studies look.  Is hard to say how they are trending right now.  We will treat him today tomorrow with his second cycle of treatment.  I will then plan for his blood transfusion tomorrow.  I want to try to transfuse him and get him through the weekend so he will feel little bit better.  He just wants to be able to help his poor wife who is crippled with arthritis.  We will plan to get him back to see [place] in another couple weeks.  I think we do have to keep a close eye out on him given his status.     [name], MD 8/6/202012:28 PM

## 2019-03-18 NOTE — Patient Instructions (Signed)
Bendamustine Injection What is this medicine? BENDAMUSTINE (BEN da MUS teen) is a chemotherapy drug. It is used to treat chronic lymphocytic leukemia and non-Hodgkin lymphoma. This medicine may be used for other purposes; ask your health care provider or pharmacist if you have questions. COMMON BRAND NAME(S): BELRAPZO, BENDEKA, Treanda What should I tell my health care provider before I take this medicine? They need to know if you have any of these conditions:  infection (especially a virus infection such as chickenpox, cold sores, or herpes)  kidney disease  liver disease  an unusual or allergic reaction to bendamustine, mannitol, other medicines, foods, dyes, or preservatives  pregnant or trying to get pregnant  breast-feeding How should I use this medicine? This medicine is for infusion into a vein. It is given by a health care professional in a hospital or clinic setting. Talk to your pediatrician regarding the use of this medicine in children. Special care may be needed. Overdosage: If you think you have taken too much of this medicine contact a poison control center or emergency room at once. NOTE: This medicine is only for you. Do not share this medicine with others. What if I miss a dose? It is important not to miss your dose. Call your doctor or health care professional if you are unable to keep an appointment. What may interact with this medicine? Do not take this medicine with any of the following medications:  clozapine This medicine may also interact with the following medications:  atazanavir  cimetidine  ciprofloxacin  enoxacin  fluvoxamine  medicines for seizures like carbamazepine and phenobarbital  mexiletine  rifampin  tacrine  thiabendazole  zileuton This list may not describe all possible interactions. Give your health care provider a list of all the medicines, herbs, non-prescription drugs, or dietary supplements you use. Also tell them if  you smoke, drink alcohol, or use illegal drugs. Some items may interact with your medicine. What should I watch for while using this medicine? This drug may make you feel generally unwell. This is not uncommon, as chemotherapy can affect healthy cells as well as cancer cells. Report any side effects. Continue your course of treatment even though you feel ill unless your doctor tells you to stop. You may need blood work done while you are taking this medicine. Call your doctor or healthcare provider for advice if you get a fever, chills or sore throat, or other symptoms of a cold or flu. Do not treat yourself. This drug decreases your body's ability to fight infections. Try to avoid being around people who are sick. This medicine may cause serious skin reactions. They can happen weeks to months after starting the medicine. Contact your healthcare provider right away if you notice fevers or flu-like symptoms with a rash. The rash may be red or purple and then turn into blisters or peeling of the skin. Or, you might notice a red rash with swelling of the face, lips or lymph nodes in your neck or under your arms. This medicine may increase your risk to bruise or bleed. Call your doctor or healthcare provider if you notice any unusual bleeding. Talk to your doctor about your risk of cancer. You may be more at risk for certain types of cancers if you take this medicine. Do not become pregnant while taking this medicine or for at least 6 months after stopping it. Women should inform their doctor if they wish to become pregnant or think they might be pregnant. Men should not   father a child while taking this medicine and for at least 3 months after stopping it. There is a potential for serious side effects to an unborn child. Talk to your healthcare provider or pharmacist for more information. Do not breast-feed an infant while taking this medicine or for at least 1 week after stopping it. This medicine may make it  more difficult to father a child. You should talk with your doctor or healthcare provider if you are concerned about your fertility. What side effects may I notice from receiving this medicine? Side effects that you should report to your doctor or health care professional as soon as possible:  allergic reactions like skin rash, itching or hives, swelling of the face, lips, or tongue  low blood counts - this medicine may decrease the number of white blood cells, red blood cells and platelets. You may be at increased risk for infections and bleeding.  rash, fever, and swollen lymph nodes  redness, blistering, peeling, or loosening of the skin, including inside the mouth  signs of infection like fever or chills, cough, sore throat, pain or difficulty passing urine  signs of decreased platelets or bleeding like bruising, pinpoint red spots on the skin, black, tarry stools, blood in the urine  signs of decreased red blood cells like being unusually weak or tired, fainting spells, lightheadedness  signs and symptoms of kidney injury like trouble passing urine or change in the amount of urine  signs and symptoms of liver injury like dark yellow or brown urine; general ill feeling or flu-like symptoms; light-colored stools; loss of appetite; nausea; right upper belly pain; unusually weak or tired; yellowing of the eyes or skin Side effects that usually do not require medical attention (report to your doctor or health care professional if they continue or are bothersome):  constipation  decreased appetite  diarrhea  headache  mouth sores  nausea, vomiting  tiredness This list may not describe all possible side effects. Call your doctor for medical advice about side effects. You may report side effects to FDA at 1-800-FDA-1088. Where should I keep my medicine? This drug is given in a hospital or clinic and will not be stored at home. NOTE: This sheet is a summary. It may not cover all  possible information. If you have questions about this medicine, talk to your doctor, pharmacist, or health care provider.  2020 [employer]vier/Gold Standard (2018-10-20 10:26:46)  

## 2019-03-19 ENCOUNTER — Other Ambulatory Visit: Payer: Self-pay

## 2019-03-19 ENCOUNTER — Inpatient Hospital Stay: Payer: Medicare HMO

## 2019-03-19 VITALS — BP 135/62 | HR 77 | Temp 97.6°F | Resp 17

## 2019-03-19 DIAGNOSIS — M2550 Pain in unspecified joint: Secondary | ICD-10-CM | POA: Diagnosis not present

## 2019-03-19 DIAGNOSIS — C9002 Multiple myeloma in relapse: Secondary | ICD-10-CM | POA: Diagnosis not present

## 2019-03-19 DIAGNOSIS — C9000 Multiple myeloma not having achieved remission: Secondary | ICD-10-CM

## 2019-03-19 DIAGNOSIS — R200 Anesthesia of skin: Secondary | ICD-10-CM | POA: Diagnosis not present

## 2019-03-19 DIAGNOSIS — D649 Anemia, unspecified: Secondary | ICD-10-CM

## 2019-03-19 DIAGNOSIS — T451X5A Adverse effect of antineoplastic and immunosuppressive drugs, initial encounter: Secondary | ICD-10-CM | POA: Diagnosis not present

## 2019-03-19 DIAGNOSIS — R634 Abnormal weight loss: Secondary | ICD-10-CM | POA: Diagnosis not present

## 2019-03-19 DIAGNOSIS — D6481 Anemia due to antineoplastic chemotherapy: Secondary | ICD-10-CM | POA: Diagnosis not present

## 2019-03-19 DIAGNOSIS — Z5111 Encounter for antineoplastic chemotherapy: Secondary | ICD-10-CM | POA: Diagnosis not present

## 2019-03-19 DIAGNOSIS — R300 Dysuria: Secondary | ICD-10-CM | POA: Diagnosis not present

## 2019-03-19 DIAGNOSIS — N289 Disorder of kidney and ureter, unspecified: Secondary | ICD-10-CM | POA: Diagnosis not present

## 2019-03-19 LAB — IGG, IGA, IGM
IgA: 8 mg/dL — ABNORMAL LOW (ref 61–437)
IgG (Immunoglobin G), Serum: 2108 mg/dL — ABNORMAL HIGH (ref 603–1613)
IgM (Immunoglobulin M), Srm: 7 mg/dL — ABNORMAL LOW (ref 15–143)

## 2019-03-19 LAB — KAPPA/LAMBDA LIGHT CHAINS
Kappa free light chain: 1.6 mg/L — ABNORMAL LOW (ref 3.3–19.4)
Kappa, lambda light chain ratio: 0 — ABNORMAL LOW (ref 0.26–1.65)
Lambda free light chains: 568.9 mg/L — ABNORMAL HIGH (ref 5.7–26.3)

## 2019-03-19 MED ORDER — DIPHENHYDRAMINE HCL 25 MG PO CAPS
ORAL_CAPSULE | ORAL | Status: AC
  Filled 2019-03-19: qty 1

## 2019-03-19 MED ORDER — SODIUM CHLORIDE 0.9% IV SOLUTION
250.0000 mL | Freq: Once | INTRAVENOUS | Status: AC
  Administered 2019-03-19: 250 mL via INTRAVENOUS
  Filled 2019-03-19: qty 250

## 2019-03-19 MED ORDER — DEXAMETHASONE SODIUM PHOSPHATE 10 MG/ML IJ SOLN
INTRAMUSCULAR | Status: AC
  Filled 2019-03-19: qty 1

## 2019-03-19 MED ORDER — FUROSEMIDE 10 MG/ML IJ SOLN: 20.0000 mg | Freq: Once | INTRAMUSCULAR | Status: DC

## 2019-03-19 MED ORDER — ACETAMINOPHEN 325 MG PO TABS
650.0000 mg | ORAL_TABLET | Freq: Once | ORAL | Status: AC
  Administered 2019-03-19: 650 mg via ORAL

## 2019-03-19 MED ORDER — DIPHENHYDRAMINE HCL 25 MG PO CAPS
25.0000 mg | ORAL_CAPSULE | Freq: Once | ORAL | Status: AC
  Administered 2019-03-19: 25 mg via ORAL

## 2019-03-19 MED ORDER — ACETAMINOPHEN 325 MG PO TABS
ORAL_TABLET | ORAL | Status: AC
  Filled 2019-03-19: qty 2

## 2019-03-19 MED ORDER — SODIUM CHLORIDE 0.9 % IV SOLN
75.0000 mg/m2 | Freq: Once | INTRAVENOUS | Status: AC
  Administered 2019-03-19: 125 mg via INTRAVENOUS
  Filled 2019-03-19: qty 5

## 2019-03-19 MED ORDER — SODIUM CHLORIDE 0.9 % IV SOLN
Freq: Once | INTRAVENOUS | Status: AC
  Administered 2019-03-19: 17:00:00 via INTRAVENOUS
  Filled 2019-03-19: qty 250

## 2019-03-19 MED ORDER — DEXAMETHASONE SODIUM PHOSPHATE 10 MG/ML IJ SOLN
10.0000 mg | Freq: Once | INTRAMUSCULAR | Status: AC
  Administered 2019-03-19: 10 mg via INTRAVENOUS

## 2019-03-19 NOTE — Patient Instructions (Signed)
Bendamustine Injection What is this medicine? BENDAMUSTINE (BEN da MUS teen) is a chemotherapy drug. It is used to treat chronic lymphocytic leukemia and non-Hodgkin lymphoma. This medicine may be used for other purposes; ask your health care provider or pharmacist if you have questions. COMMON BRAND NAME(S): BELRAPZO, BENDEKA, Treanda What should I tell my health care provider before I take this medicine? They need to know if you have any of these conditions:  infection (especially a virus infection such as chickenpox, cold sores, or herpes)  kidney disease  liver disease  an unusual or allergic reaction to bendamustine, mannitol, other medicines, foods, dyes, or preservatives  pregnant or trying to get pregnant  breast-feeding How should I use this medicine? This medicine is for infusion into a vein. It is given by a health care professional in a hospital or clinic setting. Talk to your pediatrician regarding the use of this medicine in children. Special care may be needed. Overdosage: If you think you have taken too much of this medicine contact a poison control center or emergency room at once. NOTE: This medicine is only for you. Do not share this medicine with others. What if I miss a dose? It is important not to miss your dose. Call your doctor or health care professional if you are unable to keep an appointment. What may interact with this medicine? Do not take this medicine with any of the following medications:  clozapine This medicine may also interact with the following medications:  atazanavir  cimetidine  ciprofloxacin  enoxacin  fluvoxamine  medicines for seizures like carbamazepine and phenobarbital  mexiletine  rifampin  tacrine  thiabendazole  zileuton This list may not describe all possible interactions. Give your health care provider a list of all the medicines, herbs, non-prescription drugs, or dietary supplements you use. Also tell them if  you smoke, drink alcohol, or use illegal drugs. Some items may interact with your medicine. What should I watch for while using this medicine? This drug may make you feel generally unwell. This is not uncommon, as chemotherapy can affect healthy cells as well as cancer cells. Report any side effects. Continue your course of treatment even though you feel ill unless your doctor tells you to stop. You may need blood work done while you are taking this medicine. Call your doctor or healthcare provider for advice if you get a fever, chills or sore throat, or other symptoms of a cold or flu. Do not treat yourself. This drug decreases your body's ability to fight infections. Try to avoid being around people who are sick. This medicine may cause serious skin reactions. They can happen weeks to months after starting the medicine. Contact your healthcare provider right away if you notice fevers or flu-like symptoms with a rash. The rash may be red or purple and then turn into blisters or peeling of the skin. Or, you might notice a red rash with swelling of the face, lips or lymph nodes in your neck or under your arms. This medicine may increase your risk to bruise or bleed. Call your doctor or healthcare provider if you notice any unusual bleeding. Talk to your doctor about your risk of cancer. You may be more at risk for certain types of cancers if you take this medicine. Do not become pregnant while taking this medicine or for at least 6 months after stopping it. Women should inform their doctor if they wish to become pregnant or think they might be pregnant. Men should not   father a child while taking this medicine and for at least 3 months after stopping it. There is a potential for serious side effects to an unborn child. Talk to your healthcare provider or pharmacist for more information. Do not breast-feed an infant while taking this medicine or for at least 1 week after stopping it. This medicine may make it  more difficult to father a child. You should talk with your doctor or healthcare provider if you are concerned about your fertility. What side effects may I notice from receiving this medicine? Side effects that you should report to your doctor or health care professional as soon as possible:  allergic reactions like skin rash, itching or hives, swelling of the face, lips, or tongue  low blood counts - this medicine may decrease the number of white blood cells, red blood cells and platelets. You may be at increased risk for infections and bleeding.  rash, fever, and swollen lymph nodes  redness, blistering, peeling, or loosening of the skin, including inside the mouth  signs of infection like fever or chills, cough, sore throat, pain or difficulty passing urine  signs of decreased platelets or bleeding like bruising, pinpoint red spots on the skin, black, tarry stools, blood in the urine  signs of decreased red blood cells like being unusually weak or tired, fainting spells, lightheadedness  signs and symptoms of kidney injury like trouble passing urine or change in the amount of urine  signs and symptoms of liver injury like dark yellow or brown urine; general ill feeling or flu-like symptoms; light-colored stools; loss of appetite; nausea; right upper belly pain; unusually weak or tired; yellowing of the eyes or skin Side effects that usually do not require medical attention (report to your doctor or health care professional if they continue or are bothersome):  constipation  decreased appetite  diarrhea  headache  mouth sores  nausea, vomiting  tiredness This list may not describe all possible side effects. Call your doctor for medical advice about side effects. You may report side effects to FDA at 1-800-FDA-1088. Where should I keep my medicine? This drug is given in a hospital or clinic and will not be stored at home. NOTE: This sheet is a summary. It may not cover all  possible information. If you have questions about this medicine, talk to your doctor, pharmacist, or health care provider.  2020 [employer]vier/Gold Standard (2018-10-20 10:26:46)  

## 2019-03-20 LAB — BPAM RBC
Blood Product Expiration Date: 202009092359
Blood Product Expiration Date: 202009112359
ISSUE DATE / TIME: 202008070822
ISSUE DATE / TIME: 202008070822
Unit Type and Rh: 5100
Unit Type and Rh: 9500

## 2019-03-20 LAB — TYPE AND SCREEN
ABO/RH(D): O POS
Antibody Screen: POSITIVE
DAT, IgG: NEGATIVE
Unit division: 0
Unit division: 0

## 2019-03-22 ENCOUNTER — Telehealth: Payer: Self-pay | Admitting: *Deleted

## 2019-03-22 ENCOUNTER — Other Ambulatory Visit: Payer: Self-pay | Admitting: Hematology & Oncology

## 2019-03-22 NOTE — Telephone Encounter (Signed)
Per Dr. Marin Olp, he doesn't need to take oral Decadron. That was an old prescription. He verbalized understanding.

## 2019-03-23 LAB — PROTEIN ELECTROPHORESIS, SERUM, WITH REFLEX
A/G Ratio: 0.7 (ref 0.7–1.7)
Albumin ELP: 2.6 g/dL — ABNORMAL LOW (ref 2.9–4.4)
Alpha-1-Globulin: 0.3 g/dL (ref 0.0–0.4)
Alpha-2-Globulin: 1.2 g/dL — ABNORMAL HIGH (ref 0.4–1.0)
Beta Globulin: 0.7 g/dL (ref 0.7–1.3)
Gamma Globulin: 1.6 g/dL (ref 0.4–1.8)
Globulin, Total: 3.8 g/dL (ref 2.2–3.9)
M-Spike, %: 1.5 g/dL — ABNORMAL HIGH
SPEP Interpretation: 0
Total Protein ELP: 6.4 g/dL (ref 6.0–8.5)

## 2019-03-23 LAB — IMMUNOFIXATION REFLEX, SERUM
IgA: 7 mg/dL — ABNORMAL LOW (ref 61–437)
IgG (Immunoglobin G), Serum: 1972 mg/dL — ABNORMAL HIGH (ref 603–1613)
IgM (Immunoglobulin M), Srm: 7 mg/dL — ABNORMAL LOW (ref 15–143)

## 2019-03-25 DIAGNOSIS — Z9181 History of falling: Secondary | ICD-10-CM | POA: Diagnosis not present

## 2019-03-25 DIAGNOSIS — R2681 Unsteadiness on feet: Secondary | ICD-10-CM | POA: Diagnosis not present

## 2019-03-25 DIAGNOSIS — M6281 Muscle weakness (generalized): Secondary | ICD-10-CM | POA: Diagnosis not present

## 2019-03-26 DIAGNOSIS — R2681 Unsteadiness on feet: Secondary | ICD-10-CM | POA: Diagnosis not present

## 2019-03-26 DIAGNOSIS — Z9181 History of falling: Secondary | ICD-10-CM | POA: Diagnosis not present

## 2019-03-26 DIAGNOSIS — M6281 Muscle weakness (generalized): Secondary | ICD-10-CM | POA: Diagnosis not present

## 2019-03-30 DIAGNOSIS — M6281 Muscle weakness (generalized): Secondary | ICD-10-CM | POA: Diagnosis not present

## 2019-03-30 DIAGNOSIS — Z9181 History of falling: Secondary | ICD-10-CM | POA: Diagnosis not present

## 2019-03-30 DIAGNOSIS — R2681 Unsteadiness on feet: Secondary | ICD-10-CM | POA: Diagnosis not present

## 2019-04-02 DIAGNOSIS — R2681 Unsteadiness on feet: Secondary | ICD-10-CM | POA: Diagnosis not present

## 2019-04-02 DIAGNOSIS — M6281 Muscle weakness (generalized): Secondary | ICD-10-CM | POA: Diagnosis not present

## 2019-04-02 DIAGNOSIS — Z9181 History of falling: Secondary | ICD-10-CM | POA: Diagnosis not present

## 2019-04-05 DIAGNOSIS — M6281 Muscle weakness (generalized): Secondary | ICD-10-CM | POA: Diagnosis not present

## 2019-04-05 DIAGNOSIS — Z9181 History of falling: Secondary | ICD-10-CM | POA: Diagnosis not present

## 2019-04-05 DIAGNOSIS — R2681 Unsteadiness on feet: Secondary | ICD-10-CM | POA: Diagnosis not present

## 2019-04-06 ENCOUNTER — Inpatient Hospital Stay: Payer: Medicare HMO

## 2019-04-06 ENCOUNTER — Inpatient Hospital Stay (HOSPITAL_BASED_OUTPATIENT_CLINIC_OR_DEPARTMENT_OTHER): Payer: Medicare HMO | Admitting: Hematology & Oncology

## 2019-04-06 ENCOUNTER — Other Ambulatory Visit: Payer: Self-pay

## 2019-04-06 ENCOUNTER — Encounter: Payer: Self-pay | Admitting: *Deleted

## 2019-04-06 VITALS — BP 154/74 | HR 92 | Temp 97.3°F | Resp 17 | Wt 144.2 lb

## 2019-04-06 DIAGNOSIS — C9001 Multiple myeloma in remission: Secondary | ICD-10-CM

## 2019-04-06 DIAGNOSIS — C9002 Multiple myeloma in relapse: Secondary | ICD-10-CM | POA: Diagnosis not present

## 2019-04-06 DIAGNOSIS — R200 Anesthesia of skin: Secondary | ICD-10-CM | POA: Diagnosis not present

## 2019-04-06 DIAGNOSIS — M2550 Pain in unspecified joint: Secondary | ICD-10-CM | POA: Diagnosis not present

## 2019-04-06 DIAGNOSIS — T451X5A Adverse effect of antineoplastic and immunosuppressive drugs, initial encounter: Secondary | ICD-10-CM | POA: Diagnosis not present

## 2019-04-06 DIAGNOSIS — N289 Disorder of kidney and ureter, unspecified: Secondary | ICD-10-CM | POA: Diagnosis not present

## 2019-04-06 DIAGNOSIS — R300 Dysuria: Secondary | ICD-10-CM | POA: Diagnosis not present

## 2019-04-06 DIAGNOSIS — Z5111 Encounter for antineoplastic chemotherapy: Secondary | ICD-10-CM | POA: Diagnosis not present

## 2019-04-06 DIAGNOSIS — R634 Abnormal weight loss: Secondary | ICD-10-CM | POA: Diagnosis not present

## 2019-04-06 DIAGNOSIS — D6481 Anemia due to antineoplastic chemotherapy: Secondary | ICD-10-CM | POA: Diagnosis not present

## 2019-04-06 DIAGNOSIS — C9000 Multiple myeloma not having achieved remission: Secondary | ICD-10-CM

## 2019-04-06 LAB — CMP (CANCER CENTER ONLY)
ALT: 13 U/L (ref 0–44)
AST: 17 U/L (ref 15–41)
Albumin: 3 g/dL — ABNORMAL LOW (ref 3.5–5.0)
Alkaline Phosphatase: 65 U/L (ref 38–126)
Anion gap: 6 (ref 5–15)
BUN: 41 mg/dL — ABNORMAL HIGH (ref 8–23)
CO2: 19 mmol/L — ABNORMAL LOW (ref 22–32)
Calcium: 8.4 mg/dL — ABNORMAL LOW (ref 8.9–10.3)
Chloride: 112 mmol/L — ABNORMAL HIGH (ref 98–111)
Creatinine: 1.64 mg/dL — ABNORMAL HIGH (ref 0.61–1.24)
GFR, Est AFR Am: 43 mL/min — ABNORMAL LOW (ref >60)
GFR, Estimated: 37 mL/min — ABNORMAL LOW (ref >60)
Glucose, Bld: 109 mg/dL — ABNORMAL HIGH (ref 70–99)
Potassium: 5.1 mmol/L (ref 3.5–5.1)
Sodium: 137 mmol/L (ref 135–145)
Total Bilirubin: 0.5 mg/dL (ref 0.3–1.2)
Total Protein: 6.8 g/dL (ref 6.5–8.1)

## 2019-04-06 LAB — CBC WITH DIFFERENTIAL (CANCER CENTER ONLY)
Abs Immature Granulocytes: 0.09 10*3/uL — ABNORMAL HIGH (ref 0.00–0.07)
Basophils Absolute: 0.1 10*3/uL (ref 0.0–0.1)
Basophils Relative: 1 %
Eosinophils Absolute: 0.4 10*3/uL (ref 0.0–0.5)
Eosinophils Relative: 7 %
HCT: 30.3 % — ABNORMAL LOW (ref 39.0–52.0)
Hemoglobin: 10 g/dL — ABNORMAL LOW (ref 13.0–17.0)
Immature Granulocytes: 2 %
Lymphocytes Relative: 7 %
Lymphs Abs: 0.4 10*3/uL — ABNORMAL LOW (ref 0.7–4.0)
MCH: 30.1 pg (ref 26.0–34.0)
MCHC: 33 g/dL (ref 30.0–36.0)
MCV: 91.3 fL (ref 80.0–100.0)
Monocytes Absolute: 1 10*3/uL (ref 0.1–1.0)
Monocytes Relative: 18 %
Neutro Abs: 3.7 10*3/uL (ref 1.7–7.7)
Neutrophils Relative %: 65 %
Platelet Count: 216 10*3/uL (ref 150–400)
RBC: 3.32 MIL/uL — ABNORMAL LOW (ref 4.22–5.81)
RDW: 16.6 % — ABNORMAL HIGH (ref 11.5–15.5)
WBC Count: 5.7 10*3/uL (ref 4.0–10.5)
nRBC: 0 % (ref 0.0–0.2)

## 2019-04-06 LAB — SAMPLE TO BLOOD BANK

## 2019-04-06 LAB — LACTATE DEHYDROGENASE: LDH: 254 U/L — ABNORMAL HIGH (ref 98–192)

## 2019-04-06 NOTE — Progress Notes (Signed)
Hematology and Oncology Follow Up Visit  Christian Burns [id number] 10/25/1929 85 y.o. 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 Palliative radiation therapy to right ilium Kyprolis/Cytoxanq 3 week dosing- s/p cycle 24 (Cytoxan restarted on cycle 13) - DC'd due to progression  Current Therapy:   Elotuzomab - started 10/29/2018 -- s/p cycle #1 -- d/c on 12/24/2018 Pomalidomide 2 mg po q day (21 on/7 off) - started 10/29/2018 Daratumumab - start cycle # 1 on 12/31/2018 -- d/c on -02/04/2019 Zometa IV q 4 weeks - on hold Aranesp 300 g subcutaneous as needed for hemoglobin less than 10 Radiation therapy to right femur, 10 fraction --completed 3000 rad on 11/06/2018 Bendamustine/Decadron -- s/p cycle #2 -- started on 02/10/2019   Interim History:  Christian Burns is here today for follow-up.  Surprisingly enough, he is doing quite well.  It is amazing that the chemotherapy is actually working.  His M spike is down to 1.5 g/dL.  His IgG level is down to 1970 mg/dL.  His lambda light chain is down to 56.8 mg/dL.  He is doing okay.  He did get 2 units of blood a couple weeks ago.  His hemoglobin is 8.1.  This did help him out.  He felt better.  He is eating a little bit better.  I will think he is gained much weight.  He has had no problems with fever.  He has had no obvious bleeding.  There is been no diarrhea.  He has had no leg swelling.  Still has a numbness in his feet.  His right hip is doing okay.  He had surgery for this earlier this year.  This has helped quite a bit.  Overall, I would say his performance status is ECOG 2.    Medications:  Allergies as of 04/06/2019      Reactions   Sulfa Antibiotics  Itching   Sulfasalazine Itching      Medication List       Accurate as of April 06, 2019 12:11 PM. If you have any questions, ask your nurse or doctor.        amLODipine-benazepril 5-10 MG capsule Commonly known as: LOTREL TAKE ONE CAPSULE EACH DAY What changed: See the new instructions.   azithromycin 500 MG tablet Commonly known as: ZITHROMAX   dextromethorphan-guaiFENesin 30-600 MG 12hr tablet Commonly known as: MUCINEX DM Take 1 tablet by mouth 2 (two) times daily.   dronabinol 5 MG capsule Commonly known as: MARINOL TAKE ONE CAPSULE TWICE A DAY BEFORE A MEAL   finasteride 5 MG tablet Commonly known as: PROSCAR Take 5 mg by mouth daily.   lidocaine-prilocaine cream Commonly known as: EMLA Apply to affected area once   LORazepam 0.5 MG tablet Commonly known as: Ativan Take 1 tablet (0.5 mg total) by mouth every 6 (six) hours as needed (Nausea or vomiting).   megestrol 400 MG/10ML suspension Commonly known as: MEGACE Take 15 mLs (600 mg total) by mouth daily.   multivitamin with minerals Tabs tablet Take 1 tablet by mouth daily.   ondansetron 8 MG tablet Commonly known as: Zofran Take 1 tablet (8 mg total) by mouth 2 (two) times daily as needed for refractory nausea / vomiting. Start on day 2 after Bendamustine  chemotherapy.   polyvinyl alcohol 1.4 % ophthalmic solution Commonly known as: LIQUIFILM TEARS Place 1 drop into both eyes as needed for dry eyes.   PROBIOTIC PO Take 1 capsule by mouth daily.   prochlorperazine 10 MG tablet Commonly known as: COMPAZINE Take 1 tablet (10 mg total) by mouth every 6 (six) hours as needed (nausea and vomiting).   pyridOXINE 100 MG tablet Commonly known as: VITAMIN B-6 Take 100 mg by mouth daily.   traMADol 50 MG tablet Commonly known as: ULTRAM TAKE ONE TABLET EVERY EIGHT HOURS AS NEEDED What changed: See the new instructions.   vitamin B-12 500 MCG tablet Commonly known as: CYANOCOBALAMIN Take 500 mcg by  mouth daily.   Vitamin D (Ergocalciferol) 1.25 MG (50000 UT) Caps capsule Commonly known as: DRISDOL Take 50,000 Units by mouth every Sunday.       Allergies:  Allergies  Allergen Reactions  . Sulfa Antibiotics Itching  . Sulfasalazine Itching    Past Medical History, Surgical history, Social history, and Family History were reviewed and updated.  Review of Systems: Review of Systems  Constitutional: Positive for weight loss.  HENT: Negative.   Eyes: Negative.   Respiratory: Negative.   Cardiovascular: Negative.   Gastrointestinal: Negative.   Genitourinary: Positive for dysuria.  Musculoskeletal: Positive for joint pain and myalgias.  Skin: Negative.   Neurological: Negative.   Endo/Heme/Allergies: Negative.   Psychiatric/Behavioral: Negative.      Physical Exam:  weight is 144 lb 4 oz (65.4 kg). His temporal temperature is 97.3 F (36.3 C) (abnormal). His blood pressure is 154/74 (abnormal) and his pulse is 92. His respiration is 17 and oxygen saturation is 100%.   Wt Readings from Last 3 Encounters:  04/06/19 144 lb 4 oz (65.4 kg)  03/18/19 145 lb (65.8 kg)  03/03/19 140 lb (63.5 kg)    Physical Exam Vitals signs reviewed.  HENT:     Head: Normocephalic and atraumatic.  Eyes:     Pupils: Pupils are equal, round, and reactive to light.  Neck:     Musculoskeletal: Normal range of motion.  Cardiovascular:     Rate and Rhythm: Normal rate and regular rhythm.     Heart sounds: Normal heart sounds.  Pulmonary:     Effort: Pulmonary effort is normal.     Breath sounds: Normal breath sounds.  Abdominal:     General: Bowel sounds are normal.     Palpations: Abdomen is soft.  Musculoskeletal: Normal range of motion.        General: No tenderness or deformity.  Lymphadenopathy:     Cervical: No cervical adenopathy.  Skin:    General: Skin is warm and dry.     Findings: No erythema or rash.  Neurological:     Mental Status: He is alert and oriented to  person, place, and time.  Psychiatric:        Behavior: Behavior normal.        Thought Content: Thought content normal.        Judgment: Judgment normal.      Lab Results  Component Value Date   WBC 5.7 04/06/2019   HGB 10.0 (L) 04/06/2019   HCT 30.3 (L) 04/06/2019   MCV 91.3 04/06/2019   PLT 216 04/06/2019   Lab Results  Component Value Date   FERRITIN 125 12/10/2018   IRON 40 (L) 12/10/2018   TIBC 220 12/10/2018   UIBC 180 12/10/2018   IRONPCTSAT 18 (L) 12/10/2018   Lab Results  Component Value Date   RETICCTPCT 1.3 12/10/2018   RBC 3.32 (L) 04/06/2019   Lab Results  Component Value Date   KPAFRELGTCHN 1.6 (L) 03/18/2019   LAMBDASER 568.9 (H) 03/18/2019   KAPLAMBRATIO 0.00 (L) 03/18/2019   Lab Results  Component Value Date   IGGSERUM 2,108 (H) 03/18/2019   IGGSERUM 1,972 (H) 03/18/2019   IGA 8 (L) 03/18/2019   IGA 7 (L) 03/18/2019   IGMSERUM 7 (L) 03/18/2019   IGMSERUM 7 (L) 03/18/2019   Lab Results  Component Value Date   TOTALPROTELP 6.4 03/18/2019   ALBUMINELP 2.6 (L) 03/18/2019   A1GS 0.3 03/18/2019   A2GS 1.2 (H) 03/18/2019   BETS 0.7 03/18/2019   BETA2SER 0.3 07/28/2015   GAMS 1.6 03/18/2019   MSPIKE 1.5 (H) 03/18/2019   SPEI Comment 11/26/2018     Chemistry      Component Value Date/Time   NA 137 04/06/2019 1104   NA 146 (H) 08/07/2017 1153   NA 141 01/09/2017 1004   K 5.1 04/06/2019 1104   K 4.6 08/07/2017 1153   K 4.4 01/09/2017 1004   CL 112 (H) 04/06/2019 1104   CL 108 08/07/2017 1153   CO2 19 (L) 04/06/2019 1104   CO2 26 08/07/2017 1153   CO2 22 01/09/2017 1004   BUN 41 (H) 04/06/2019 1104   BUN 24 (H) 08/07/2017 1153   BUN 23.9 01/09/2017 1004   CREATININE 1.64 (H) 04/06/2019 1104   CREATININE 1.56 (H) 05/26/2018 1508   CREATININE 1.5 (H) 01/09/2017 1004      Component Value Date/Time   CALCIUM 8.4 (L) 04/06/2019 1104   CALCIUM 8.8 08/07/2017 1153   CALCIUM 8.8 01/09/2017 1004   ALKPHOS 65 04/06/2019 1104   ALKPHOS 97  (H) 08/07/2017 1153   ALKPHOS 80 01/09/2017 1004   AST 17 04/06/2019 1104   AST 18 01/09/2017 1004   ALT 13 04/06/2019 1104   ALT 20 08/07/2017 1153   ALT 12 01/09/2017 1004   BILITOT 0.5 04/06/2019 1104   BILITOT 0.92 01/09/2017 1004       Impression and Plan: Christian Burns is a very pleasant 85 yo caucasian gentleman with relapsed IgG lambda myeloma.   Hopefully, we will find that he is going to respond to the bendamustine for a few months.  He really wants to make it to the end of the year.  I know that this is incredibly important for him.  He is trying to be here for his wife who is crippled by arthritis.  I am just glad that his quality of life seems to be holding on.  We will start his third cycle on 04/15/2019.  I will plan to see him back in September for his fourth cycle of treatment.       [name], MD 8/25/202012:11 PM

## 2019-04-07 DIAGNOSIS — M6281 Muscle weakness (generalized): Secondary | ICD-10-CM | POA: Diagnosis not present

## 2019-04-07 DIAGNOSIS — Z9181 History of falling: Secondary | ICD-10-CM | POA: Diagnosis not present

## 2019-04-07 DIAGNOSIS — R2681 Unsteadiness on feet: Secondary | ICD-10-CM | POA: Diagnosis not present

## 2019-04-07 LAB — IGG, IGA, IGM
IgA: 6 mg/dL — ABNORMAL LOW (ref 61–437)
IgG (Immunoglobin G), Serum: 1772 mg/dL — ABNORMAL HIGH (ref 603–1613)
IgM (Immunoglobulin M), Srm: 6 mg/dL — ABNORMAL LOW (ref 15–143)

## 2019-04-07 LAB — KAPPA/LAMBDA LIGHT CHAINS
Kappa free light chain: 1.2 mg/L — ABNORMAL LOW (ref 3.3–19.4)
Kappa, lambda light chain ratio: 0 — ABNORMAL LOW (ref 0.26–1.65)
Lambda free light chains: 295.5 mg/L — ABNORMAL HIGH (ref 5.7–26.3)

## 2019-04-08 ENCOUNTER — Telehealth: Payer: Self-pay | Admitting: *Deleted

## 2019-04-08 DIAGNOSIS — Z9181 History of falling: Secondary | ICD-10-CM | POA: Diagnosis not present

## 2019-04-08 DIAGNOSIS — R2681 Unsteadiness on feet: Secondary | ICD-10-CM | POA: Diagnosis not present

## 2019-04-08 DIAGNOSIS — M6281 Muscle weakness (generalized): Secondary | ICD-10-CM | POA: Diagnosis not present

## 2019-04-08 LAB — PROTEIN ELECTROPHORESIS, SERUM, WITH REFLEX
A/G Ratio: 0.7 (ref 0.7–1.7)
Albumin ELP: 2.6 g/dL — ABNORMAL LOW (ref 2.9–4.4)
Alpha-1-Globulin: 0.3 g/dL (ref 0.0–0.4)
Alpha-2-Globulin: 1.2 g/dL — ABNORMAL HIGH (ref 0.4–1.0)
Beta Globulin: 0.7 g/dL (ref 0.7–1.3)
Gamma Globulin: 1.5 g/dL (ref 0.4–1.8)
Globulin, Total: 3.7 g/dL (ref 2.2–3.9)
M-Spike, %: 1.3 g/dL — ABNORMAL HIGH
SPEP Interpretation: 0
Total Protein ELP: 6.3 g/dL (ref 6.0–8.5)

## 2019-04-08 LAB — IMMUNOFIXATION REFLEX, SERUM
IgA: 6 mg/dL — ABNORMAL LOW (ref 61–437)
IgG (Immunoglobin G), Serum: 1699 mg/dL — ABNORMAL HIGH (ref 603–1613)
IgM (Immunoglobulin M), Srm: 6 mg/dL — ABNORMAL LOW (ref 15–143)

## 2019-04-08 NOTE — Telephone Encounter (Signed)
-----   Message from [name], MD sent at 04/07/2019  4:37 PM EDT ----- Call - the light chain is down by 50% again!!!  This is amazing!!  Christian Burns

## 2019-04-08 NOTE — Telephone Encounter (Signed)
Patient notified per order of Dr. Marin Olp that "the light chain is down by 50% again!!!  This is amazing!!"  Pt appreciative of call and has no questions or concerns at this time.

## 2019-04-12 ENCOUNTER — Other Ambulatory Visit: Payer: Self-pay | Admitting: Hematology & Oncology

## 2019-04-12 DIAGNOSIS — Z9181 History of falling: Secondary | ICD-10-CM | POA: Diagnosis not present

## 2019-04-12 DIAGNOSIS — I10 Essential (primary) hypertension: Secondary | ICD-10-CM

## 2019-04-12 DIAGNOSIS — R2681 Unsteadiness on feet: Secondary | ICD-10-CM | POA: Diagnosis not present

## 2019-04-12 DIAGNOSIS — M6281 Muscle weakness (generalized): Secondary | ICD-10-CM | POA: Diagnosis not present

## 2019-04-13 ENCOUNTER — Other Ambulatory Visit: Payer: Self-pay | Admitting: Hematology & Oncology

## 2019-04-14 DIAGNOSIS — M6281 Muscle weakness (generalized): Secondary | ICD-10-CM | POA: Diagnosis not present

## 2019-04-14 DIAGNOSIS — R2681 Unsteadiness on feet: Secondary | ICD-10-CM | POA: Diagnosis not present

## 2019-04-14 DIAGNOSIS — Z9181 History of falling: Secondary | ICD-10-CM | POA: Diagnosis not present

## 2019-04-15 ENCOUNTER — Inpatient Hospital Stay: Payer: Medicare HMO | Attending: Hematology & Oncology | Admitting: Family

## 2019-04-15 ENCOUNTER — Inpatient Hospital Stay: Payer: Medicare HMO

## 2019-04-15 ENCOUNTER — Other Ambulatory Visit: Payer: Self-pay

## 2019-04-15 VITALS — BP 138/64 | HR 84 | Temp 97.7°F | Resp 19 | Ht 70.0 in | Wt 148.6 lb

## 2019-04-15 VITALS — BP 134/66 | HR 80

## 2019-04-15 DIAGNOSIS — K921 Melena: Secondary | ICD-10-CM | POA: Diagnosis not present

## 2019-04-15 DIAGNOSIS — C9000 Multiple myeloma not having achieved remission: Secondary | ICD-10-CM

## 2019-04-15 DIAGNOSIS — K219 Gastro-esophageal reflux disease without esophagitis: Secondary | ICD-10-CM | POA: Insufficient documentation

## 2019-04-15 DIAGNOSIS — D649 Anemia, unspecified: Secondary | ICD-10-CM | POA: Diagnosis not present

## 2019-04-15 DIAGNOSIS — Z5111 Encounter for antineoplastic chemotherapy: Secondary | ICD-10-CM | POA: Insufficient documentation

## 2019-04-15 DIAGNOSIS — C9001 Multiple myeloma in remission: Secondary | ICD-10-CM | POA: Diagnosis not present

## 2019-04-15 DIAGNOSIS — C9002 Multiple myeloma in relapse: Secondary | ICD-10-CM | POA: Insufficient documentation

## 2019-04-15 DIAGNOSIS — R531 Weakness: Secondary | ICD-10-CM | POA: Insufficient documentation

## 2019-04-15 DIAGNOSIS — R202 Paresthesia of skin: Secondary | ICD-10-CM | POA: Insufficient documentation

## 2019-04-15 DIAGNOSIS — R197 Diarrhea, unspecified: Secondary | ICD-10-CM | POA: Insufficient documentation

## 2019-04-15 DIAGNOSIS — R0602 Shortness of breath: Secondary | ICD-10-CM | POA: Insufficient documentation

## 2019-04-15 DIAGNOSIS — N289 Disorder of kidney and ureter, unspecified: Secondary | ICD-10-CM | POA: Insufficient documentation

## 2019-04-15 DIAGNOSIS — R200 Anesthesia of skin: Secondary | ICD-10-CM | POA: Insufficient documentation

## 2019-04-15 DIAGNOSIS — Z79899 Other long term (current) drug therapy: Secondary | ICD-10-CM | POA: Diagnosis not present

## 2019-04-15 DIAGNOSIS — Z882 Allergy status to sulfonamides status: Secondary | ICD-10-CM | POA: Diagnosis not present

## 2019-04-15 LAB — CMP (CANCER CENTER ONLY)
ALT: 12 U/L (ref 0–44)
AST: 18 U/L (ref 15–41)
Albumin: 3.3 g/dL — ABNORMAL LOW (ref 3.5–5.0)
Alkaline Phosphatase: 59 U/L (ref 38–126)
Anion gap: 8 (ref 5–15)
BUN: 53 mg/dL — ABNORMAL HIGH (ref 8–23)
CO2: 15 mmol/L — ABNORMAL LOW (ref 22–32)
Calcium: 9.1 mg/dL (ref 8.9–10.3)
Chloride: 113 mmol/L — ABNORMAL HIGH (ref 98–111)
Creatinine: 2.05 mg/dL — ABNORMAL HIGH (ref 0.61–1.24)
GFR, Est AFR Am: 33 mL/min — ABNORMAL LOW (ref >60)
GFR, Estimated: 28 mL/min — ABNORMAL LOW (ref >60)
Glucose, Bld: 90 mg/dL (ref 70–99)
Potassium: 5.1 mmol/L (ref 3.5–5.1)
Sodium: 136 mmol/L (ref 135–145)
Total Bilirubin: 0.5 mg/dL (ref 0.3–1.2)
Total Protein: 7 g/dL (ref 6.5–8.1)

## 2019-04-15 LAB — CBC WITH DIFFERENTIAL (CANCER CENTER ONLY)
Abs Immature Granulocytes: 0.38 10*3/uL — ABNORMAL HIGH (ref 0.00–0.07)
Basophils Absolute: 0.1 10*3/uL (ref 0.0–0.1)
Basophils Relative: 1 %
Eosinophils Absolute: 0.4 10*3/uL (ref 0.0–0.5)
Eosinophils Relative: 6 %
HCT: 30.1 % — ABNORMAL LOW (ref 39.0–52.0)
Hemoglobin: 9.9 g/dL — ABNORMAL LOW (ref 13.0–17.0)
Immature Granulocytes: 6 %
Lymphocytes Relative: 4 %
Lymphs Abs: 0.2 10*3/uL — ABNORMAL LOW (ref 0.7–4.0)
MCH: 30.3 pg (ref 26.0–34.0)
MCHC: 32.9 g/dL (ref 30.0–36.0)
MCV: 92 fL (ref 80.0–100.0)
Monocytes Absolute: 1 10*3/uL (ref 0.1–1.0)
Monocytes Relative: 16 %
Neutro Abs: 4 10*3/uL (ref 1.7–7.7)
Neutrophils Relative %: 67 %
Platelet Count: 209 10*3/uL (ref 150–400)
RBC: 3.27 MIL/uL — ABNORMAL LOW (ref 4.22–5.81)
RDW: 16.4 % — ABNORMAL HIGH (ref 11.5–15.5)
WBC Count: 6.1 10*3/uL (ref 4.0–10.5)
nRBC: 0 % (ref 0.0–0.2)

## 2019-04-15 MED ORDER — PALONOSETRON HCL INJECTION 0.25 MG/5ML
0.2500 mg | Freq: Once | INTRAVENOUS | Status: AC
  Administered 2019-04-15: 0.25 mg via INTRAVENOUS

## 2019-04-15 MED ORDER — DEXAMETHASONE SODIUM PHOSPHATE 10 MG/ML IJ SOLN
INTRAMUSCULAR | Status: AC
  Filled 2019-04-15: qty 1

## 2019-04-15 MED ORDER — SODIUM CHLORIDE 0.9 % IV SOLN
Freq: Once | INTRAVENOUS | Status: DC
  Filled 2019-04-15: qty 250

## 2019-04-15 MED ORDER — DEXAMETHASONE SODIUM PHOSPHATE 10 MG/ML IJ SOLN
10.0000 mg | Freq: Once | INTRAMUSCULAR | Status: AC
  Administered 2019-04-15: 10 mg via INTRAVENOUS

## 2019-04-15 MED ORDER — PALONOSETRON HCL INJECTION 0.25 MG/5ML
INTRAVENOUS | Status: AC
  Filled 2019-04-15: qty 5

## 2019-04-15 MED ORDER — SODIUM CHLORIDE 0.9 % IV SOLN
60.0000 mg/m2 | Freq: Once | INTRAVENOUS | Status: AC
  Administered 2019-04-15: 100 mg via INTRAVENOUS
  Filled 2019-04-15: qty 4

## 2019-04-15 MED ORDER — SODIUM CHLORIDE 0.9 % IV SOLN
Freq: Once | INTRAVENOUS | Status: AC
  Administered 2019-04-15: 11:00:00 via INTRAVENOUS
  Filled 2019-04-15: qty 250

## 2019-04-15 NOTE — Progress Notes (Signed)
Hematology and Oncology Follow Up Visit  Christian Burns [id number] 11/24/1929 85 y.o. 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 Palliative radiation therapy to right ilium Kyprolis/Cytoxanq 3 week dosing- s/p cycle 24 (Cytoxan restarted on cycle 13) - DC'd due to progression Radiation therapy to right femur, 10 fraction --completed 3000 rad on 11/06/2018 Elotuzomab - started 10/29/2018 -- s/p cycle #1 -- d/c on 12/24/2018 Pomalidomide 2 mg po q day (21 on/7 off) - started 10/29/2018 -- d/c 12/31/2018 Daratumumab - start cycle # 1 on 12/31/2018 -- d/c on -02/04/2019  Current Therapy:   Bendamustine/Decadron -- started on 02/10/2019, s/p cycle 2 Zometa IV q 4 weeks - on hold Aranesp 300 g subcutaneous as needed for hemoglobin less than 10   Interim History:  Christian Burns here today for follow-up and treatment. He is feeling weak and SOB with over exertion. He takes breaks when needed to rest.  He said he is eating well and drinking one carnation instant breakfast shake each morning. He will increase the carnation instant breakfast to twice a day.  He is staying well hydrated. His weight is stable at 148.6 lbs.  He has had issues with GERD but states that this resolves with taking a Tums.  Last week M-spike was 1.3, IgG level 1,772 mg/dL and lambda light chains 29.55 mg/dL.  No fever, n/v, cough, rash, dizziness, chest pain, palpitations, abdominal pain or changes in bowel or bladder habits.  He states that he has had a few "random" episodes where his stool was loose and looked like "coffee grounds". No obvious blood loss to report. No bruising or petechiae.  He has mild numbness and tingling in his hands  and feet. No swelling or tenderness in her extremities.   ECOG Performance Status: 2 - Symptomatic, <50% confined to bed  Medications:  Allergies as of 04/15/2019      Reactions   Sulfa Antibiotics Itching   Sulfasalazine Itching      Medication List       Accurate as of April 15, 2019 11:52 AM. If you have any questions, ask your nurse or doctor.        amLODipine-benazepril 5-10 MG capsule Commonly known as: LOTREL TAKE ONE CAPSULE EACH DAY   azithromycin 500 MG tablet Commonly known as: ZITHROMAX   dextromethorphan-guaiFENesin 30-600 MG 12hr tablet Commonly known as: MUCINEX DM Take 1 tablet by mouth 2 (two) times daily.   dronabinol 5 MG capsule Commonly known as: MARINOL TAKE ONE CAPSULE TWICE A DAY BEFORE A MEAL   finasteride 5 MG tablet Commonly known as: PROSCAR Take 5 mg by mouth daily.   lidocaine-prilocaine cream Commonly known as: EMLA Apply to affected area once   LORazepam 0.5 MG tablet Commonly known as: Ativan Take 1 tablet (0.5 mg total) by mouth every 6 (six) hours as needed (Nausea or vomiting).   megestrol 400 MG/10ML suspension Commonly known as: MEGACE Take 15 mLs (600 mg total) by mouth daily.   multivitamin with minerals Tabs tablet Take 1 tablet by mouth daily.   ondansetron 8 MG tablet Commonly known as: Zofran Take 1 tablet (8 mg total) by mouth 2 (two) times daily as needed for refractory nausea / vomiting. Start on day 2 after Bendamustine  chemotherapy.   polyvinyl alcohol 1.4 % ophthalmic solution Commonly known as: LIQUIFILM TEARS Place 1 drop into both eyes as needed for dry eyes.   PROBIOTIC PO Take 1 capsule by mouth daily.   prochlorperazine 10 MG tablet Commonly known as: COMPAZINE Take 1 tablet (10 mg total) by mouth every 6 (six) hours as needed (nausea and vomiting).   pyridOXINE 100 MG tablet Commonly known as: VITAMIN B-6 Take 100 mg by mouth daily.   traMADol 50 MG tablet Commonly known as: ULTRAM TAKE  ONE TABLET EVERY EIGHT HOURS AS NEEDED What changed: See the new instructions.   vitamin B-12 500 MCG tablet Commonly known as: CYANOCOBALAMIN Take 500 mcg by mouth daily.   Vitamin D (Ergocalciferol) 1.25 MG (50000 UT) Caps capsule Commonly known as: DRISDOL Take 50,000 Units by mouth every Sunday.       Allergies:  Allergies  Allergen Reactions  . Sulfa Antibiotics Itching  . Sulfasalazine Itching    Past Medical History, Surgical history, Social history, and Family History were reviewed and updated.  Review of Systems: All other 10 point review of systems is negative.   Physical Exam:  height is 5' 10" (1.778 m) and weight is 148 lb 9.6 oz (67.4 kg). His temporal temperature is 97.7 F (36.5 C). His blood pressure is 138/64 and his pulse is 84. His respiration is 19 and oxygen saturation is 100%.   Wt Readings from Last 3 Encounters:  04/15/19 148 lb 9.6 oz (67.4 kg)  04/06/19 144 lb 4 oz (65.4 kg)  03/18/19 145 lb (65.8 kg)    Ocular: Sclerae unicteric, pupils equal, round and reactive to light Ear-nose-throat: Oropharynx clear, dentition fair Lymphatic: No cervical or supraclavicular adenopathy Lungs no rales or rhonchi, good excursion bilaterally Heart regular rate and rhythm, no murmur appreciated Abd soft, nontender, positive bowel sounds, no liver or spleen tip palpated on exam, no fluid wave  MSK no focal spinal tenderness, no joint edema Neuro: non-focal, well-oriented, appropriate affect Breasts: Deferred   Lab Results  Component Value Date   WBC 6.1 04/15/2019   HGB 9.9 (L) 04/15/2019   HCT 30.1 (L) 04/15/2019   MCV 92.0 04/15/2019   PLT 209 04/15/2019   Lab Results  Component Value Date   FERRITIN 125 12/10/2018   IRON 40 (L) 12/10/2018   TIBC 220 12/10/2018   UIBC 180 12/10/2018   IRONPCTSAT 18 (L) 12/10/2018   Lab Results  Component Value Date   RETICCTPCT 1.3 12/10/2018   RBC 3.27 (L) 04/15/2019   Lab Results  Component Value Date    KPAFRELGTCHN 1.2 (L) 04/06/2019   LAMBDASER 295.5 (H) 04/06/2019   KAPLAMBRATIO 0.00 (L) 04/06/2019   Lab Results  Component Value Date   IGGSERUM 1,699 (H) 04/06/2019   IGA 6 (L) 04/06/2019   IGMSERUM 6 (L) 04/06/2019   Lab Results  Component Value Date   TOTALPROTELP 6.3 04/06/2019   ALBUMINELP 2.6 (L) 04/06/2019   A1GS 0.3 04/06/2019   A2GS 1.2 (H) 04/06/2019   BETS 0.7 04/06/2019   BETA2SER 0.3 07/28/2015   GAMS 1.5 04/06/2019   MSPIKE 1.3 (H) 04/06/2019   SPEI Comment 11/26/2018     Chemistry      Component Value Date/Time   NA 136 04/15/2019 0918   NA 146 (H) 08/07/2017 1153   NA 141 01/09/2017 1004   K 5.1 04/15/2019 0918   K 4.6 08/07/2017 1153   K 4.4 01/09/2017 1004   CL 113 (H) 04/15/2019 0918     CL 108 08/07/2017 1153   CO2 15 (L) 04/15/2019 0918   CO2 26 08/07/2017 1153   CO2 22 01/09/2017 1004   BUN 53 (H) 04/15/2019 0918   BUN 24 (H) 08/07/2017 1153   BUN 23.9 01/09/2017 1004   CREATININE 2.05 (H) 04/15/2019 0918   CREATININE 1.56 (H) 05/26/2018 1508   CREATININE 1.5 (H) 01/09/2017 1004      Component Value Date/Time   CALCIUM 9.1 04/15/2019 0918   CALCIUM 8.8 08/07/2017 1153   CALCIUM 8.8 01/09/2017 1004   ALKPHOS 59 04/15/2019 0918   ALKPHOS 97 (H) 08/07/2017 1153   ALKPHOS 80 01/09/2017 1004   AST 18 04/15/2019 0918   AST 18 01/09/2017 1004   ALT 12 04/15/2019 0918   ALT 20 08/07/2017 1153   ALT 12 01/09/2017 1004   BILITOT 0.5 04/15/2019 0918   BILITOT 0.92 01/09/2017 1004       Impression and Plan: Christian Burns is a very pleasant 85 yo caucasian gentleman with relapsed IgG lambda myeloma.  I spoke with Dr. Zhao and Lisa in pharmacy regarding his increase in creatinine and BUN. We will reduce his Bendeka to 60 mg/m2 and give him fluids as well.  With his recent anemia (which is better today at 9.9) and coffee ground loose stools, I will send him home with a hemoccult stool collection kit and he will bring back in when he can.  We will  plan to see him back in 4 weeks.  He will contact our office with any questions or concerns. We can certainly see him sooner if needed.   Sarah [place], NP 9/3/202011:52 AM  

## 2019-04-15 NOTE — Patient Instructions (Signed)
[facility] Discharge Instructions for Patients Receiving Chemotherapy  Today you received the following chemotherapy agents Bendeka.   To help prevent nausea and vomiting after your treatment, we encourage you to take your nausea medication as prescribed.    If you develop nausea and vomiting that is not controlled by your nausea medication, call the clinic.   BELOW ARE SYMPTOMS THAT SHOULD BE REPORTED IMMEDIATELY:  *FEVER GREATER THAN 100.5 F  *CHILLS WITH OR WITHOUT FEVER  NAUSEA AND VOMITING THAT IS NOT CONTROLLED WITH YOUR NAUSEA MEDICATION  *UNUSUAL SHORTNESS OF BREATH  *UNUSUAL BRUISING OR BLEEDING  TENDERNESS IN MOUTH AND THROAT WITH OR WITHOUT PRESENCE OF ULCERS  *URINARY PROBLEMS  *BOWEL PROBLEMS  UNUSUAL RASH Items with * indicate a potential emergency and should be followed up as soon as possible.  Feel free to call the clinic should you have any questions or concerns. The clinic phone number is [phone].  Please show the CHEMO ALERT CARD at check-in to the Emergency Department and triage nurse.  

## 2019-04-15 NOTE — Progress Notes (Signed)
SCr = 2.05.   Will decrease Bendeka dose to 60 mg/m2 for this cycle per Dr. Maylon Peppers.

## 2019-04-15 NOTE — Progress Notes (Signed)
Ok to treat with elevated creatinine today per Laverna Peace, NP.

## 2019-04-16 ENCOUNTER — Inpatient Hospital Stay: Payer: Medicare HMO

## 2019-04-16 VITALS — BP 141/67 | HR 94 | Temp 98.0°F | Resp 16

## 2019-04-16 DIAGNOSIS — C9002 Multiple myeloma in relapse: Secondary | ICD-10-CM

## 2019-04-16 DIAGNOSIS — I6381 Other cerebral infarction due to occlusion or stenosis of small artery: Secondary | ICD-10-CM | POA: Diagnosis not present

## 2019-04-16 DIAGNOSIS — R4781 Slurred speech: Secondary | ICD-10-CM | POA: Diagnosis not present

## 2019-04-16 DIAGNOSIS — C9000 Multiple myeloma not having achieved remission: Secondary | ICD-10-CM

## 2019-04-16 MED ORDER — DEXAMETHASONE SODIUM PHOSPHATE 10 MG/ML IJ SOLN
INTRAMUSCULAR | Status: AC
  Filled 2019-04-16: qty 1

## 2019-04-16 MED ORDER — SODIUM CHLORIDE 0.9 % IV SOLN
Freq: Once | INTRAVENOUS | Status: AC
  Administered 2019-04-16: 13:00:00 via INTRAVENOUS
  Filled 2019-04-16: qty 250

## 2019-04-16 MED ORDER — DEXAMETHASONE SODIUM PHOSPHATE 10 MG/ML IJ SOLN
10.0000 mg | Freq: Once | INTRAMUSCULAR | Status: AC
  Administered 2019-04-16: 10 mg via INTRAVENOUS

## 2019-04-16 MED ORDER — SODIUM CHLORIDE 0.9 % IV SOLN
60.0000 mg/m2 | Freq: Once | INTRAVENOUS | Status: AC
  Administered 2019-04-16: 100 mg via INTRAVENOUS
  Filled 2019-04-16: qty 4

## 2019-04-16 NOTE — Patient Instructions (Signed)
Bendamustine Injection What is this medicine? BENDAMUSTINE (BEN da MUS teen) is a chemotherapy drug. It is used to treat chronic lymphocytic leukemia and non-Hodgkin lymphoma. This medicine may be used for other purposes; ask your health care provider or pharmacist if you have questions. COMMON BRAND NAME(S): BELRAPZO, BENDEKA, Treanda What should I tell my health care provider before I take this medicine? They need to know if you have any of these conditions:  infection (especially a virus infection such as chickenpox, cold sores, or herpes)  kidney disease  liver disease  an unusual or allergic reaction to bendamustine, mannitol, other medicines, foods, dyes, or preservatives  pregnant or trying to get pregnant  breast-feeding How should I use this medicine? This medicine is for infusion into a vein. It is given by a health care professional in a hospital or clinic setting. Talk to your pediatrician regarding the use of this medicine in children. Special care may be needed. Overdosage: If you think you have taken too much of this medicine contact a poison control center or emergency room at once. NOTE: This medicine is only for you. Do not share this medicine with others. What if I miss a dose? It is important not to miss your dose. Call your doctor or health care professional if you are unable to keep an appointment. What may interact with this medicine? Do not take this medicine with any of the following medications:  clozapine This medicine may also interact with the following medications:  atazanavir  cimetidine  ciprofloxacin  enoxacin  fluvoxamine  medicines for seizures like carbamazepine and phenobarbital  mexiletine  rifampin  tacrine  thiabendazole  zileuton This list may not describe all possible interactions. Give your health care provider a list of all the medicines, herbs, non-prescription drugs, or dietary supplements you use. Also tell them if  you smoke, drink alcohol, or use illegal drugs. Some items may interact with your medicine. What should I watch for while using this medicine? This drug may make you feel generally unwell. This is not uncommon, as chemotherapy can affect healthy cells as well as cancer cells. Report any side effects. Continue your course of treatment even though you feel ill unless your doctor tells you to stop. You may need blood work done while you are taking this medicine. Call your doctor or healthcare provider for advice if you get a fever, chills or sore throat, or other symptoms of a cold or flu. Do not treat yourself. This drug decreases your body's ability to fight infections. Try to avoid being around people who are sick. This medicine may cause serious skin reactions. They can happen weeks to months after starting the medicine. Contact your healthcare provider right away if you notice fevers or flu-like symptoms with a rash. The rash may be red or purple and then turn into blisters or peeling of the skin. Or, you might notice a red rash with swelling of the face, lips or lymph nodes in your neck or under your arms. This medicine may increase your risk to bruise or bleed. Call your doctor or healthcare provider if you notice any unusual bleeding. Talk to your doctor about your risk of cancer. You may be more at risk for certain types of cancers if you take this medicine. Do not become pregnant while taking this medicine or for at least 6 months after stopping it. Women should inform their doctor if they wish to become pregnant or think they might be pregnant. Men should not   father a child while taking this medicine and for at least 3 months after stopping it. There is a potential for serious side effects to an unborn child. Talk to your healthcare provider or pharmacist for more information. Do not breast-feed an infant while taking this medicine or for at least 1 week after stopping it. This medicine may make it  more difficult to father a child. You should talk with your doctor or healthcare provider if you are concerned about your fertility. What side effects may I notice from receiving this medicine? Side effects that you should report to your doctor or health care professional as soon as possible:  allergic reactions like skin rash, itching or hives, swelling of the face, lips, or tongue  low blood counts - this medicine may decrease the number of white blood cells, red blood cells and platelets. You may be at increased risk for infections and bleeding.  rash, fever, and swollen lymph nodes  redness, blistering, peeling, or loosening of the skin, including inside the mouth  signs of infection like fever or chills, cough, sore throat, pain or difficulty passing urine  signs of decreased platelets or bleeding like bruising, pinpoint red spots on the skin, black, tarry stools, blood in the urine  signs of decreased red blood cells like being unusually weak or tired, fainting spells, lightheadedness  signs and symptoms of kidney injury like trouble passing urine or change in the amount of urine  signs and symptoms of liver injury like dark yellow or brown urine; general ill feeling or flu-like symptoms; light-colored stools; loss of appetite; nausea; right upper belly pain; unusually weak or tired; yellowing of the eyes or skin Side effects that usually do not require medical attention (report to your doctor or health care professional if they continue or are bothersome):  constipation  decreased appetite  diarrhea  headache  mouth sores  nausea, vomiting  tiredness This list may not describe all possible side effects. Call your doctor for medical advice about side effects. You may report side effects to FDA at 1-800-FDA-1088. Where should I keep my medicine? This drug is given in a hospital or clinic and will not be stored at home. NOTE: This sheet is a summary. It may not cover all  possible information. If you have questions about this medicine, talk to your doctor, pharmacist, or health care provider.  2020 [employer]vier/Gold Standard (2018-10-20 10:26:46)  

## 2019-04-18 ENCOUNTER — Other Ambulatory Visit: Payer: Self-pay | Admitting: Hematology & Oncology

## 2019-04-18 DIAGNOSIS — R634 Abnormal weight loss: Secondary | ICD-10-CM

## 2019-04-18 DIAGNOSIS — R112 Nausea with vomiting, unspecified: Secondary | ICD-10-CM

## 2019-04-18 DIAGNOSIS — C9002 Multiple myeloma in relapse: Secondary | ICD-10-CM

## 2019-04-19 ENCOUNTER — Emergency Department (HOSPITAL_COMMUNITY): Payer: Medicare HMO

## 2019-04-19 ENCOUNTER — Other Ambulatory Visit: Payer: Self-pay

## 2019-04-19 ENCOUNTER — Encounter (HOSPITAL_COMMUNITY): Payer: Self-pay | Admitting: Neurology

## 2019-04-19 ENCOUNTER — Inpatient Hospital Stay (HOSPITAL_COMMUNITY): Payer: Medicare HMO

## 2019-04-19 ENCOUNTER — Inpatient Hospital Stay (HOSPITAL_COMMUNITY)
Admission: EM | Admit: 2019-04-19 | Discharge: 2019-04-21 | DRG: 041 | Disposition: A | Discharge status: Home Health | Payer: Medicare HMO | Attending: Neurology | Admitting: Neurology

## 2019-04-19 DIAGNOSIS — I63412 Cerebral infarction due to embolism of left middle cerebral artery: Secondary | ICD-10-CM | POA: Diagnosis not present

## 2019-04-19 DIAGNOSIS — Z8249 Family history of ischemic heart disease and other diseases of the circulatory system: Secondary | ICD-10-CM | POA: Diagnosis not present

## 2019-04-19 DIAGNOSIS — Z96641 Presence of right artificial hip joint: Secondary | ICD-10-CM | POA: Diagnosis present

## 2019-04-19 DIAGNOSIS — C9002 Multiple myeloma in relapse: Secondary | ICD-10-CM | POA: Diagnosis present

## 2019-04-19 DIAGNOSIS — D631 Anemia in chronic kidney disease: Secondary | ICD-10-CM | POA: Diagnosis present

## 2019-04-19 DIAGNOSIS — I10 Essential (primary) hypertension: Secondary | ICD-10-CM | POA: Diagnosis present

## 2019-04-19 DIAGNOSIS — R29810 Facial weakness: Secondary | ICD-10-CM | POA: Diagnosis not present

## 2019-04-19 DIAGNOSIS — R4701 Aphasia: Secondary | ICD-10-CM | POA: Diagnosis not present

## 2019-04-19 DIAGNOSIS — I63312 Cerebral infarction due to thrombosis of left middle cerebral artery: Secondary | ICD-10-CM | POA: Diagnosis not present

## 2019-04-19 DIAGNOSIS — Z923 Personal history of irradiation: Secondary | ICD-10-CM | POA: Diagnosis not present

## 2019-04-19 DIAGNOSIS — C9000 Multiple myeloma not having achieved remission: Secondary | ICD-10-CM

## 2019-04-19 DIAGNOSIS — T17908A Unspecified foreign body in respiratory tract, part unspecified causing other injury, initial encounter: Secondary | ICD-10-CM

## 2019-04-19 DIAGNOSIS — N183 Chronic kidney disease, stage 3 (moderate): Secondary | ICD-10-CM | POA: Diagnosis not present

## 2019-04-19 DIAGNOSIS — E785 Hyperlipidemia, unspecified: Secondary | ICD-10-CM | POA: Diagnosis not present

## 2019-04-19 DIAGNOSIS — I6381 Other cerebral infarction due to occlusion or stenosis of small artery: Principal | ICD-10-CM | POA: Diagnosis present

## 2019-04-19 DIAGNOSIS — I6389 Other cerebral infarction: Secondary | ICD-10-CM | POA: Diagnosis not present

## 2019-04-19 DIAGNOSIS — R4781 Slurred speech: Secondary | ICD-10-CM | POA: Diagnosis not present

## 2019-04-19 DIAGNOSIS — E876 Hypokalemia: Secondary | ICD-10-CM | POA: Diagnosis present

## 2019-04-19 DIAGNOSIS — T17320A Food in larynx causing asphyxiation, initial encounter: Secondary | ICD-10-CM | POA: Diagnosis not present

## 2019-04-19 DIAGNOSIS — R531 Weakness: Secondary | ICD-10-CM | POA: Diagnosis not present

## 2019-04-19 DIAGNOSIS — J690 Pneumonitis due to inhalation of food and vomit: Secondary | ICD-10-CM | POA: Diagnosis not present

## 2019-04-19 DIAGNOSIS — Z882 Allergy status to sulfonamides status: Secondary | ICD-10-CM

## 2019-04-19 DIAGNOSIS — Z20828 Contact with and (suspected) exposure to other viral communicable diseases: Secondary | ICD-10-CM | POA: Diagnosis not present

## 2019-04-19 DIAGNOSIS — R29703 NIHSS score 3: Secondary | ICD-10-CM | POA: Diagnosis present

## 2019-04-19 DIAGNOSIS — I129 Hypertensive chronic kidney disease with stage 1 through stage 4 chronic kidney disease, or unspecified chronic kidney disease: Secondary | ICD-10-CM | POA: Diagnosis not present

## 2019-04-19 DIAGNOSIS — D649 Anemia, unspecified: Secondary | ICD-10-CM | POA: Diagnosis not present

## 2019-04-19 DIAGNOSIS — R54 Age-related physical debility: Secondary | ICD-10-CM | POA: Diagnosis present

## 2019-04-19 DIAGNOSIS — I639 Cerebral infarction, unspecified: Secondary | ICD-10-CM | POA: Diagnosis present

## 2019-04-19 DIAGNOSIS — R29818 Other symptoms and signs involving the nervous system: Secondary | ICD-10-CM | POA: Diagnosis not present

## 2019-04-19 LAB — COMPREHENSIVE METABOLIC PANEL
ALT: 17 U/L (ref 0–44)
AST: 38 U/L (ref 15–41)
Albumin: 2.6 g/dL — ABNORMAL LOW (ref 3.5–5.0)
Alkaline Phosphatase: 60 U/L (ref 38–126)
Anion gap: 8 (ref 5–15)
BUN: 59 mg/dL — ABNORMAL HIGH (ref 8–23)
CO2: 12 mmol/L — ABNORMAL LOW (ref 22–32)
Calcium: 8.3 mg/dL — ABNORMAL LOW (ref 8.9–10.3)
Chloride: 117 mmol/L — ABNORMAL HIGH (ref 98–111)
Creatinine, Ser: 2.28 mg/dL — ABNORMAL HIGH (ref 0.61–1.24)
GFR calc Af Amer: 29 mL/min — ABNORMAL LOW (ref >60)
GFR calc non Af Amer: 25 mL/min — ABNORMAL LOW (ref >60)
Glucose, Bld: 95 mg/dL (ref 70–99)
Potassium: 6.1 mmol/L — ABNORMAL HIGH (ref 3.5–5.1)
Sodium: 137 mmol/L (ref 135–145)
Total Bilirubin: 1.1 mg/dL (ref 0.3–1.2)
Total Protein: 7 g/dL (ref 6.5–8.1)

## 2019-04-19 LAB — PROTIME-INR
INR: 1.1 (ref 0.8–1.2)
Prothrombin Time: 14.2 seconds (ref 11.4–15.2)

## 2019-04-19 LAB — SARS CORONAVIRUS 2 BY RT PCR (HOSPITAL ORDER, PERFORMED IN ~~LOC~~ HOSPITAL LAB): SARS Coronavirus 2: NEGATIVE

## 2019-04-19 LAB — CBC
HCT: 28.5 % — ABNORMAL LOW (ref 39.0–52.0)
Hemoglobin: 9.3 g/dL — ABNORMAL LOW (ref 13.0–17.0)
MCH: 30.4 pg (ref 26.0–34.0)
MCHC: 32.6 g/dL (ref 30.0–36.0)
MCV: 93.1 fL (ref 80.0–100.0)
Platelets: 202 10*3/uL (ref 150–400)
RBC: 3.06 MIL/uL — ABNORMAL LOW (ref 4.22–5.81)
RDW: 16.5 % — ABNORMAL HIGH (ref 11.5–15.5)
WBC: 7.4 10*3/uL (ref 4.0–10.5)
nRBC: 0 % (ref 0.0–0.2)

## 2019-04-19 LAB — I-STAT CHEM 8, ED
BUN: 58 mg/dL — ABNORMAL HIGH (ref 8–23)
Calcium, Ion: 1.26 mmol/L (ref 1.15–1.40)
Chloride: 117 mmol/L — ABNORMAL HIGH (ref 98–111)
Creatinine, Ser: 2.2 mg/dL — ABNORMAL HIGH (ref 0.61–1.24)
Glucose, Bld: 89 mg/dL (ref 70–99)
HCT: 27 % — ABNORMAL LOW (ref 39.0–52.0)
Hemoglobin: 9.2 g/dL — ABNORMAL LOW (ref 13.0–17.0)
Potassium: 5.8 mmol/L — ABNORMAL HIGH (ref 3.5–5.1)
Sodium: 138 mmol/L (ref 135–145)
TCO2: 14 mmol/L — ABNORMAL LOW (ref 22–32)

## 2019-04-19 LAB — DIFFERENTIAL
Abs Immature Granulocytes: 0.75 10*3/uL — ABNORMAL HIGH (ref 0.00–0.07)
Basophils Absolute: 0 10*3/uL (ref 0.0–0.1)
Basophils Relative: 0 %
Eosinophils Absolute: 0.4 10*3/uL (ref 0.0–0.5)
Eosinophils Relative: 5 %
Immature Granulocytes: 10 %
Lymphocytes Relative: 2 %
Lymphs Abs: 0.1 10*3/uL — ABNORMAL LOW (ref 0.7–4.0)
Monocytes Absolute: 1.2 10*3/uL — ABNORMAL HIGH (ref 0.1–1.0)
Monocytes Relative: 16 %
Neutro Abs: 4.9 10*3/uL (ref 1.7–7.7)
Neutrophils Relative %: 67 %

## 2019-04-19 LAB — BASIC METABOLIC PANEL
Anion gap: 4 — ABNORMAL LOW (ref 5–15)
BUN: 55 mg/dL — ABNORMAL HIGH (ref 8–23)
CO2: 14 mmol/L — ABNORMAL LOW (ref 22–32)
Calcium: 8.2 mg/dL — ABNORMAL LOW (ref 8.9–10.3)
Chloride: 117 mmol/L — ABNORMAL HIGH (ref 98–111)
Creatinine, Ser: 2.13 mg/dL — ABNORMAL HIGH (ref 0.61–1.24)
GFR calc Af Amer: 32 mL/min — ABNORMAL LOW (ref >60)
GFR calc non Af Amer: 27 mL/min — ABNORMAL LOW (ref >60)
Glucose, Bld: 102 mg/dL — ABNORMAL HIGH (ref 70–99)
Potassium: 5.9 mmol/L — ABNORMAL HIGH (ref 3.5–5.1)
Sodium: 135 mmol/L (ref 135–145)

## 2019-04-19 LAB — MRSA PCR SCREENING: MRSA by PCR: NEGATIVE

## 2019-04-19 LAB — APTT: aPTT: 21 seconds — ABNORMAL LOW (ref 24–36)

## 2019-04-19 MED ORDER — ACETAMINOPHEN 650 MG RE SUPP: 650.0000 mg | RECTAL | Status: DC | PRN

## 2019-04-19 MED ORDER — SODIUM CHLORIDE 0.9 % IV SOLN
50.0000 mL | Freq: Once | INTRAVENOUS | Status: AC
  Administered 2019-04-19: 50 mL via INTRAVENOUS

## 2019-04-19 MED ORDER — ALTEPLASE (STROKE) FULL DOSE INFUSION
0.9000 mg/kg | Freq: Once | INTRAVENOUS | Status: AC
  Administered 2019-04-19: 60.7 mg via INTRAVENOUS
  Filled 2019-04-19: qty 100

## 2019-04-19 MED ORDER — STROKE: EARLY STAGES OF RECOVERY BOOK
Freq: Once | Status: AC
  Administered 2019-04-19: 19:00:00
  Filled 2019-04-19: qty 1

## 2019-04-19 MED ORDER — CHLORHEXIDINE GLUCONATE CLOTH 2 % EX PADS
6.0000 | MEDICATED_PAD | Freq: Every day | CUTANEOUS | Status: DC
  Administered 2019-04-19 – 2019-04-20 (×2): 6 via TOPICAL

## 2019-04-19 MED ORDER — PANTOPRAZOLE SODIUM 40 MG IV SOLR
40.0000 mg | Freq: Every day | INTRAVENOUS | Status: DC
  Administered 2019-04-19 – 2019-04-20 (×2): 40 mg via INTRAVENOUS
  Filled 2019-04-19 (×2): qty 40

## 2019-04-19 MED ORDER — ACETAMINOPHEN 160 MG/5ML PO SOLN: 650.0000 mg | ORAL | Status: DC | PRN

## 2019-04-19 MED ORDER — ACETAMINOPHEN 325 MG PO TABS: 650.0000 mg | ORAL_TABLET | ORAL | Status: DC | PRN

## 2019-04-19 MED ORDER — SENNOSIDES-DOCUSATE SODIUM 8.6-50 MG PO TABS: 1.0000 | ORAL_TABLET | Freq: Every evening | ORAL | Status: DC | PRN

## 2019-04-19 MED ORDER — SODIUM CHLORIDE 0.9 % IV SOLN
INTRAVENOUS | Status: DC
  Administered 2019-04-19 – 2019-04-20 (×2): via INTRAVENOUS

## 2019-04-19 NOTE — Progress Notes (Signed)
Assisted tele visit to patient with son.  Christian Balthazor P, RN  

## 2019-04-19 NOTE — ED Triage Notes (Signed)
Pt arrives to ED with [facility] as a Code Stroke with complaints of sudden inability to make words. When EMS arrived on scene patient had a right sided facial droop and slurred speech. On arrival to ED bridge, patient was met by a neurologist and EDP, then was rushed to CT. Patient had an initial NIH of 3 with LOC questions, dysarthria, and facial droop scored.  TPA was started by this RN and patient was transferred to 4N ICU soon after.

## 2019-04-19 NOTE — H&P (Addendum)
Neurology history and physical    History is obtained from: Wife  HPI: Christian Burns is a 85 y.o. male with history of multiple myeloma status post radiation currently on chemotherapy, hypertension, history of radiation therapy, chronic kidney disease.  Patient lives in an assisted living.  He was normal at 2 PM when he went down to talk to a neighbor.  He came back up to the house and wife noted that he was having difficulty speaking and called the staff.  Once staff came it was noted that patient had a right facial droop and called EMS.   EMS states that in route patient improved as far as speech.  Apparently prior he was having more difficulty getting his words out.  At the bridge patient states that he does feel improved.  Attending addendum I was able to speak to the wife and son over the phone.  The provided history as above.  No history of strokes in the past.  He had not been sick prior to this presentation.  He has been compliant with his medications.  He follows with Dr. Marin Olp and oncology.  LKW: 1400 hrs. tpa given?:  Yes -risk-benefit discussion was had with wife and son prior to giving medication Premorbid modified Rankin scale (mRS): 1 NIH stroke scale of 3  ROS: ROS was performed and is negative except as noted in the HPI.  Past Medical History:  Diagnosis Date  . CKD (chronic kidney disease) 07/06/2018   CKD stage III  . Goals of care, counseling/discussion 02/04/2019  . History of radiation therapy 06/17/13-07/06/13   35 gray to upper lumbar spine  . 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 Hypertension   . Inguinal hernia   . Multiple myeloma [facility]) Sept 2010  . Multiple myeloma in relapse ([place]) 04/05/2016  . Radiation 09/26/15-10/20/15   lower thoracic spine, upper lumbar spine 30 gray    Family History  Problem Relation Age of Onset  . Hypertension Mother   . Hypertension Father    Social History:   reports that he has never smoked. He has never used  smokeless tobacco. He reports current alcohol use of about 4.0 standard drinks of alcohol per week. He reports that he does not use drugs.  Medications No current facility-administered medications for this encounter.   Current Outpatient Medications:  .  amLODipine-benazepril (LOTREL) 5-10 MG capsule, TAKE ONE CAPSULE EACH DAY, Disp: 30 capsule, Rfl: 0 .  azithromycin (ZITHROMAX) 500 MG tablet, , Disp: , Rfl:  .  dextromethorphan-guaiFENesin (MUCINEX DM) 30-600 MG 12hr tablet, Take 1 tablet by mouth 2 (two) times daily., Disp: 20 tablet, Rfl: 0 .  dronabinol (MARINOL) 5 MG capsule, TAKE ONE CAPSULE TWICE A DAY BEFORE A MEAL, Disp: 60 capsule, Rfl: 0 .  finasteride (PROSCAR) 5 MG tablet, Take 5 mg by mouth daily., Disp: , Rfl:  .  lidocaine-prilocaine (EMLA) cream, Apply to affected area once, Disp: 30 g, Rfl: 3 .  LORazepam (ATIVAN) 0.5 MG tablet, Take 1 tablet (0.5 mg total) by mouth every 6 (six) hours as needed (Nausea or vomiting). (Patient not taking: Reported on 03/18/2019), Disp: 30 tablet, Rfl: 0 .  megestrol (MEGACE) 400 MG/10ML suspension, Take 15 mLs (600 mg total) by mouth daily., Disp: 480 mL, Rfl: 5 .  Multiple Vitamin (MULTIVITAMIN WITH MINERALS) TABS tablet, Take 1 tablet by mouth daily., Disp: , Rfl:  .  ondansetron (ZOFRAN) 8 MG tablet, Take 1 tablet (8 mg total) by mouth 2 (two) times  daily as needed for refractory nausea / vomiting. Start on day 2 after Bendamustine chemotherapy. (Patient not taking: Reported on 03/18/2019), Disp: 30 tablet, Rfl: 1 .  polyvinyl alcohol (LIQUIFILM TEARS) 1.4 % ophthalmic solution, Place 1 drop into both eyes as needed for dry eyes. (Patient not taking: Reported on 03/18/2019), Disp: 15 mL, Rfl: 0 .  Probiotic Product (PROBIOTIC PO), Take 1 capsule by mouth daily., Disp: , Rfl:  .  prochlorperazine (COMPAZINE) 10 MG tablet, Take 1 tablet (10 mg total) by mouth every 6 (six) hours as needed (nausea and vomiting). (Patient not taking: Reported on  03/18/2019), Disp: 30 tablet, Rfl: 2 .  pyridOXINE (VITAMIN B-6) 100 MG tablet, Take 100 mg by mouth daily. , Disp: , Rfl:  .  traMADol (ULTRAM) 50 MG tablet, TAKE ONE TABLET EVERY EIGHT HOURS AS NEEDED (Patient taking differently: Take 50 mg by mouth every 8 (eight) hours as needed for moderate pain. ), Disp: 60 tablet, Rfl: 0 .  vitamin B-12 (CYANOCOBALAMIN) 500 MCG tablet, Take 500 mcg by mouth daily., Disp: , Rfl:  .  Vitamin D, Ergocalciferol, (DRISDOL) 50000 UNITS CAPS capsule, Take 50,000 Units by mouth every Sunday. , Disp: , Rfl:    Exam: Current vital signs: BP 138/65   Pulse 85   Temp 97.9 F (36.6 C) (Oral)   Resp 19   Ht _0  (1.778 m)   Wt 63.2 kg   SpO2 100%   BMI 19.99 kg/m  Vital signs in last 24 hours: Temp:  [97.9 F (36.6 C)-98.3 F (36.8 C)] 97.9 F (36.6 C) (09/07 1610) Pulse Rate:  [80-95] 85 (09/07 1621) Resp:  [15-21] 19 (09/07 1621) BP: (129-152)/(57-88) 138/65 (09/07 1621) SpO2:  [99 %-100 %] 100 % (09/07 1621) FiO2 (%):  [21 %] 21 % (09/07 1538) Weight:  [63.2 kg-65.4 kg] 63.2 kg (09/07 1610)  Physical Exam  Constitutional: Appears well-developed and well-nourished.  Psych: Affect appropriate to situation Eyes: No scleral injection HENT: No OP obstrucion Head: Normocephalic.  Cardiovascular: Normal rate and regular rhythm.  Respiratory: Effort normal, non-labored breathing GI: Soft.  No distension. There is no tenderness.  Skin: WDI  Neuro: Mental Status: Patient is awake, alert, oriented to person, place, month, year, and situation.  He gave age wrong-78 when he is really 85 years old. Patient is able to give a clear and coherent history. Dysarthric but no signs of aphasia Cranial Nerves: II: Visual Fields are full.  III,IV, VI: EOMI without ptosis or diploplia. Pupils equal, round and reactive to light V: Facial sensation is symmetric to temperature VII: Right facial droop.  VIII: hearing is intact to voice X: Palat elevates  symmetrically XI: Shoulder shrug is symmetric. XII: tongue is midline without atrophy or fasciculations.  Motor: Tone is normal. Bulk is normal. 5/5 strength was present in all four extremities.  Sensory: Sensation is symmetric to light touch and temperature in the arms and legs. Deep Tendon Reflexes: 2+ and symmetric in the biceps and patellae.  Plantars: Toes are downgoing bilaterally.  Cerebellar: FNF and HKS are intact bilaterally  Labs I have reviewed labs in epic and the results pertinent to this consultation are:   CBC    Component Value Date/Time   WBC 6.1 04/15/2019 0918   WBC 5.2 03/05/2019 0551   RBC 3.27 (L) 04/15/2019 0918   HGB 9.2 (L) 04/19/2019 1532   HGB 9.9 (L) 04/15/2019 0918   HGB 10.3 (L) 08/07/2017 1153   HGB 11.3 (L) 04/22/2007 1355  HCT 27.0 (L) 04/19/2019 1532   HCT 30.1 (L) 08/07/2017 1153   HCT 31.0 (L) 04/22/2007 1355   PLT 209 04/15/2019 0918   PLT 207 08/07/2017 1153   PLT 256 04/22/2007 1355   MCV 92.0 04/15/2019 0918   MCV 91 08/07/2017 1153   MCV 94.4 04/22/2007 1355   MCH 30.3 04/15/2019 0918   MCHC 32.9 04/15/2019 0918   RDW 16.4 (H) 04/15/2019 0918   RDW 14.8 08/07/2017 1153   RDW 13.8 04/22/2007 1355   LYMPHSABS 0.2 (L) 04/15/2019 0918   LYMPHSABS 0.5 (L) 08/07/2017 1153   LYMPHSABS 0.5 (L) 04/22/2007 1355   MONOABS 1.0 04/15/2019 0918   MONOABS 0.3 04/22/2007 1355   EOSABS 0.4 04/15/2019 0918   EOSABS 0.3 08/07/2017 1153   BASOSABS 0.1 04/15/2019 0918   BASOSABS 0.0 08/07/2017 1153   BASOSABS 0.0 04/22/2007 1355    CMP     Component Value Date/Time   NA 138 04/19/2019 1532   NA 146 (H) 08/07/2017 1153   NA 141 01/09/2017 1004   K 5.8 (H) 04/19/2019 1532   K 4.6 08/07/2017 1153   K 4.4 01/09/2017 1004   CL 117 (H) 04/19/2019 1532   CL 108 08/07/2017 1153   CO2 15 (L) 04/15/2019 0918   CO2 26 08/07/2017 1153   CO2 22 01/09/2017 1004   GLUCOSE 89 04/19/2019 1532   GLUCOSE 89 08/07/2017 1153   BUN 58 (H)  04/19/2019 1532   BUN 24 (H) 08/07/2017 1153   BUN 23.9 01/09/2017 1004   CREATININE 2.20 (H) 04/19/2019 1532   CREATININE 2.05 (H) 04/15/2019 0918   CREATININE 1.56 (H) 05/26/2018 1508   CREATININE 1.5 (H) 01/09/2017 1004   CALCIUM 9.1 04/15/2019 0918   CALCIUM 8.8 08/07/2017 1153   CALCIUM 8.8 01/09/2017 1004   PROT 7.0 04/15/2019 0918   PROT 6.3 (L) 08/07/2017 1153   PROT 5.7 (L) 01/09/2017 1004   ALBUMIN 3.3 (L) 04/15/2019 0918   ALBUMIN 2.9 (L) 08/07/2017 1153   ALBUMIN 3.2 (L) 01/09/2017 1004   AST 18 04/15/2019 0918   AST 18 01/09/2017 1004   ALT 12 04/15/2019 0918   ALT 20 08/07/2017 1153   ALT 12 01/09/2017 1004   ALKPHOS 59 04/15/2019 0918   ALKPHOS 97 (H) 08/07/2017 1153   ALKPHOS 80 01/09/2017 1004   BILITOT 0.5 04/15/2019 0918   BILITOT 0.92 01/09/2017 1004   GFRNONAA 28 (L) 04/15/2019 0918   GFRAA 33 (L) 04/15/2019 0918   Imaging I have reviewed the images obtained:  CT-scan of the brain-no acute changes.  Possible evolving right internal capsule lacunar infarct.  Etta Quill PA-C [facility] [phone]  M-F  (9:00 am- 5:00 PM)  04/19/2019, 3:35 PM     Attending addendum Patient seen and examined as an acute code stroke Sudden onset of slurred speech at around after 2 PM.  Last known normal was at 2 PM. I spoke in depth visit his wife and son over the phone who provided reliable history. I discussed my plan of offering TPA to him to the family.  Answered all their questions.  Discussed risks and benefits. tPA was given. No signs of LVO on exam.  Deranged renal function hence CTA was not done.  Assessment:  85 year old man with above past medical history with sudden onset of slurred speech and right facial droop with last known normal at 2 PM with no contraindications for IV TPA was given IV TPA after reviewing risks benefits and discussing with  the family-wife and son. Likely small vessel infarct.  Plan: Acute Ischemic Stroke Acuity:  Acute -Admit to: ICU -Continue Statin -Hold Aspirin until 24 hour post tPA neuroimaging is stable and without evidence of bleeding -Blood pressure control, goal of SYS <180 -MRI/ECHO/A1C/Lipid panel. -PT/OT/ST therapies and recommendations when able  CNS -Close neuro monitoring while in ICU  Dysarthria -NPO until cleared by speech -ST to evaluate  RESP -No active issues at this time but might have some aspiration due to dysarthria. R/O aspiration pneumonia. -Check chest xray  CV Essential (primary) hypertension -Aggressive BP control, goal SBP <180 -Titrate oral agents-as patient has received TPA -TTE -EKG  Hyperlipidemia, unspecified  - Statin for goal LDL < 70  HEME/Oncology Anemia- likely chronic. Check labs again and -transfuse for hgb < 7  History of multiple myeloma -oncology follow up -Hold home meds.  ENDO -goal HgbA1c < 7 -No active issues patient does not have diabetes  GI/GU CKD Stage 3 (GFR 30-59) -Gentle hydration  Fluid/Electrolyte Disorders -CMP shows K 6.1 -possibly hemolyzed sample. -Repeat BMP now.   Prophylaxis DVT: SCD GI: Protonix Bowel: Senokot  Diet: NPO until cleared by speech  Code Status: Full Code   Present on admission -Acute ischemic stroke -Multiple myeloma -Possible aspiration pneumonia   -- Amie Portland, MD [facility] Pager: [phone] If 7pm to 7am, please call on call as listed on AMION.   CRITICAL CARE ATTESTATION Performed by: Amie Portland, MD Total critical care time: 45 minutes Critical care time was exclusive of separately billable procedures and treating other patients and/or supervising APPs/Residents/Students Critical care was necessary to treat or prevent imminent or life-threatening deterioration due to acute ischemic stroke.  This patient is critically ill and at significant risk for neurological worsening and/or death and care requires constant monitoring. Critical care was time  spent personally by me on the following activities: development of treatment plan with patient and/or surrogate as well as nursing, discussions with consultants, evaluation of patient's response to treatment, examination of patient, obtaining history from patient or surrogate, ordering and performing treatments and interventions, ordering and review of laboratory studies, ordering and review of radiographic studies, pulse oximetry, re-evaluation of patient's condition, participation in multidisciplinary rounds and medical decision making of high complexity in the care of this patient.

## 2019-04-19 NOTE — Progress Notes (Signed)
PHARMACIST CODE STROKE RESPONSE  Notified to mix tPA at 1536 by Dr. Rory Percy Delivered tPA to RN at 1541  tPA dose = 5.9mg  bolus over 1 minute followed by 53mg  for a total dose of 58.9mg  over 1 hour  Issues/delays encountered (if applicable): none  Alanda Slim, PharmD, [facility] Clinical Pharmacist Please see AMION for all Pharmacists' Contact Phone Numbers 04/19/2019, 3:49 PM

## 2019-04-19 NOTE — ED Provider Notes (Signed)
[facility] EMERGENCY DEPARTMENT Provider Note   CSN: [id number] Arrival date & time: 04/19/19  1527     History   Chief Complaint No chief complaint on file.   HPI Christian Burns is a 85 y.o. male.     Patient is an 85 year old male with past medical history of multiple myeloma, chronic renal insufficiency, hypertension.  He presents today for evaluation of facial droop and slurred speech.  Patient last seen normal at approximately 2:00 at his assisted living facility.  According to the wife, he left there home for a few minutes, then returned with the above complaints.  Patient denies any chest pain or difficulty breathing.  The history is provided by the patient.  Cerebrovascular Accident This is a new problem. Episode onset: 2 PM. The problem occurs constantly. The problem has been gradually improving. Pertinent negatives include no chest pain, no headaches and no shortness of breath. Nothing aggravates the symptoms. Nothing relieves the symptoms. He has tried nothing for the symptoms.    Past Medical History:  Diagnosis Date  . CKD (chronic kidney disease) 07/06/2018   CKD stage III  . Goals of care, counseling/discussion 02/04/2019  . History of radiation therapy 06/17/13-07/06/13   35 gray to upper lumbar spine  . 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 Hypertension   . Inguinal hernia   . Multiple myeloma [facility]) Sept 2010  . Multiple myeloma in relapse ([place]) 04/05/2016  . Radiation 09/26/15-10/20/15   lower thoracic spine, upper lumbar spine 30 gray    Patient Active Problem List   Diagnosis Date Noted  . Stroke ([place]) 04/19/2019  . Community acquired pneumonia 03/03/2019  . AKI (acute kidney injury) ([place]) 03/03/2019  . Normocytic anemia 03/03/2019  . Goals of care, counseling/discussion 02/04/2019  . Acute osteomyelitis of jaw 11/26/2018  . Bacteremia   . 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 Sepsis due to pneumonia ([place])    . Left lower lobe pneumonia ([place]) 10/06/2018  . Right hip pain 09/01/2018  . Iron deficiency anemia secondary to inadequate dietary iron intake 07/18/2016  . Multiple myeloma in relapse ([place]) 04/05/2016  . 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 Multiple myeloma in remission ([place]) 09/08/2015  . Myeloma ([place]) 08/23/2011    Past Surgical History:  Procedure Laterality Date  . COLONOSCOPY    . HIP PINNING,CANNULATED Right 09/01/2018   Procedure: [facility] NAIL HIP PINNING;  Surgeon: Melrose Nakayama, MD;  Location: [facility];  Service: Orthopedics;  Laterality: Right;  . TEE WITHOUT CARDIOVERSION N/A 10/09/2018   Procedure: TRANSESOPHAGEAL ECHOCARDIOGRAM (TEE);  Surgeon: Acie Fredrickson Wonda Cheng, MD;  Location: [facility] ENDOSCOPY;  Service: Cardiovascular;  Laterality: N/A;  . TONSILLECTOMY          Home Medications    Prior to Admission medications   Medication Sig Start Date End Date Taking? Authorizing Provider  amLODipine-benazepril (LOTREL) 5-10 MG capsule TAKE ONE CAPSULE EACH DAY 04/12/19   [name], MD  azithromycin (ZITHROMAX) 500 MG tablet  03/06/19   [provider]  dextromethorphan-guaiFENesin (MUCINEX DM) 30-600 MG 12hr tablet Take 1 tablet by mouth 2 (two) times daily. 03/05/19   Swayze, Ava, DO  dronabinol (MARINOL) 5 MG capsule TAKE ONE CAPSULE TWICE A DAY BEFORE A MEAL 03/16/19   [name], MD  finasteride (PROSCAR) 5 MG tablet Take 5 mg by mouth daily.    [provider]  lidocaine-prilocaine (EMLA) cream Apply to affected area once 02/10/19   Ennever,  Rudell Cobb, MD  LORazepam (ATIVAN) 0.5 MG tablet Take 1 tablet (0.5 mg total) by mouth every 6 (six) hours as needed (Nausea or vomiting). Patient not taking: Reported on 03/18/2019 02/10/19   [name], MD  megestrol (MEGACE) 400 MG/10ML suspension Take 15 mLs (600 mg total) by mouth daily. 01/08/19   [name], MD  Multiple Vitamin (MULTIVITAMIN WITH MINERALS) TABS tablet Take 1 tablet by mouth  daily.    [provider]  ondansetron (ZOFRAN) 8 MG tablet Take 1 tablet (8 mg total) by mouth 2 (two) times daily as needed for refractory nausea / vomiting. Start on day 2 after Bendamustine chemotherapy. Patient not taking: Reported on 03/18/2019 02/10/19   [name], MD  polyvinyl alcohol (LIQUIFILM TEARS) 1.4 % ophthalmic solution Place 1 drop into both eyes as needed for dry eyes. Patient not taking: Reported on 03/18/2019 03/05/19   Swayze, Ava, DO  Probiotic Product (PROBIOTIC PO) Take 1 capsule by mouth daily.    [provider]  prochlorperazine (COMPAZINE) 10 MG tablet Take 1 tablet (10 mg total) by mouth every 6 (six) hours as needed (nausea and vomiting). Patient not taking: Reported on 03/18/2019 02/10/19   [name], MD  pyridOXINE (VITAMIN B-6) 100 MG tablet Take 100 mg by mouth daily.  09/17/13   [name], MD  traMADol (ULTRAM) 50 MG tablet TAKE ONE TABLET EVERY EIGHT HOURS AS NEEDED Patient taking differently: Take 50 mg by mouth every 8 (eight) hours as needed for moderate pain.  02/08/19   [name], MD  vitamin B-12 (CYANOCOBALAMIN) 500 MCG tablet Take 500 mcg by mouth daily.    [provider]  Vitamin D, Ergocalciferol, (DRISDOL) 50000 UNITS CAPS capsule Take 50,000 Units by mouth every Sunday.     [provider]    Family History Family History  Problem Relation Age of Onset  . Hypertension Mother   . Hypertension Father     Social History Social History   Tobacco Use  . Smoking status: Never Smoker  . Smokeless tobacco: Never Used  . Tobacco comment: never used tobacco  Substance Use Topics  . Alcohol use: Yes    Alcohol/week: 4.0 standard drinks    Types: 4 Glasses of wine per week  . Drug use: No     Allergies   Sulfa antibiotics and Sulfasalazine   Review of Systems Review of Systems  Respiratory: Negative for shortness of breath.   Cardiovascular: Negative for chest pain.  Neurological:  Negative for headaches.  All other systems reviewed and are negative.    Physical Exam Updated Vital Signs BP (!) 143/57   Pulse 88   Temp 98.3 F (36.8 C) (Oral)   Resp (!) 21   Wt 65.4 kg   SpO2 100%   BMI 20.69 kg/m   Physical Exam Vitals signs and nursing note reviewed.  Constitutional:      General: He is not in acute distress.    Appearance: He is well-developed. He is not diaphoretic.  HENT:     Head: Normocephalic and atraumatic.  Neck:     Musculoskeletal: Normal range of motion and neck supple.  Cardiovascular:     Rate and Rhythm: Normal rate and regular rhythm.     Heart sounds: No murmur. No friction rub.  Pulmonary:     Effort: Pulmonary effort is normal. No respiratory distress.     Breath sounds: Normal breath sounds. No wheezing or rales.  Abdominal:  General: Bowel sounds are normal. There is no distension.     Palpations: Abdomen is soft.     Tenderness: There is no abdominal tenderness.  Musculoskeletal: Normal range of motion.  Skin:    General: Skin is warm and dry.  Neurological:     Mental Status: He is alert and oriented to person, place, and time.     Coordination: Coordination normal.     Comments: There is a slight right-sided facial droop noted.  Speech is somewhat slurred.  Otherwise strength, motor, and coordination appear intact.      ED Treatments / Results  Labs (all labs ordered are listed, but only abnormal results are displayed) Labs Reviewed  APTT - Abnormal; Notable for the following components:      Result Value   aPTT 21 (*)    All other components within normal limits  CBC - Abnormal; Notable for the following components:   RBC 3.06 (*)    Hemoglobin 9.3 (*)    HCT 28.5 (*)    RDW 16.5 (*)    All other components within normal limits  I-STAT CHEM 8, ED - Abnormal; Notable for the following components:   Potassium 5.8 (*)    Chloride 117 (*)    BUN 58 (*)    Creatinine, Ser 2.20 (*)    TCO2 14 (*)     Hemoglobin 9.2 (*)    HCT 27.0 (*)    All other components within normal limits  PROTIME-INR  DIFFERENTIAL  COMPREHENSIVE METABOLIC PANEL  CBG MONITORING, ED    EKG ED ECG REPORT   Date: 04/19/2019  Rate: 90  Rhythm: normal sinus rhythm  QRS Axis: normal  Intervals: normal  ST/T Wave abnormalities: normal  Conduction Disutrbances:none  Narrative Interpretation:   Old EKG Reviewed: unchanged  I have personally reviewed the EKG tracing and agree with the computerized printout as noted.   Radiology Ct Head Code Stroke Wo Contrast  Result Date: 04/19/2019 CLINICAL DATA:  Code stroke. Code stroke. Slurred speech. Left-sided weakness. EXAM: CT HEAD WITHOUT CONTRAST TECHNIQUE: Contiguous axial images were obtained from the base of the skull through the vertex without intravenous contrast. COMPARISON:  MRI face 05/12/2017 FINDINGS: Brain: In age indeterminate lacunar infarct is present in the anterior limb of the right internal capsule adjacent to the right caudate head. Asymmetric white matter attenuation is present in the subinsular white matter on the right as well. Other scattered subcortical white matter changes are present bilaterally. Basal ganglia are otherwise intact. Cortex is unremarkable. Insert brainstem Vascular: Atherosclerotic calcifications are present within the cavernous internal carotid arteries bilaterally. There is no asymmetric hyperdense vessel. Skull: Calvarium is intact. No focal lytic or blastic lesions are present. Sinuses/Orbits: The paranasal sinuses and mastoid air cells are clear. Bilateral lens replacements are noted. Globes and orbits are otherwise unremarkable. ASPECTS [facility] Stroke Program Early CT Score) - Ganglionic level infarction (caudate, lentiform nuclei, internal capsule, insula, M1-M3 cortex): 6/7 - Supraganglionic infarction (M4-M6 cortex): 3/3 Total score (0-10 with 10 being normal): 9/10 IMPRESSION: 1. Age indeterminate lacunar infarct involving  the anterior limb of the right internal capsule and subinsular white matter. 2. Mild generalized white matter likely reflects the sequela of chronic microvascular ischemia. 3. ASPECTS is 9/10 Electronically Signed   By: San Morelle M.D.   On: 04/19/2019 15:52    Procedures Procedures (including critical care time)  Medications Ordered in ED Medications   stroke: mapping our early stages of recovery book (has no administration  in time range)  0.9 %  sodium chloride infusion (has no administration in time range)  acetaminophen (TYLENOL) tablet 650 mg (has no administration in time range)    Or  acetaminophen (TYLENOL) solution 650 mg (has no administration in time range)    Or  acetaminophen (TYLENOL) suppository 650 mg (has no administration in time range)  senna-docusate (Senokot-S) tablet 1 tablet (has no administration in time range)  pantoprazole (PROTONIX) injection 40 mg (has no administration in time range)  alteplase (ACTIVASE) 1 mg/mL infusion 60.7 mg (60.7 mg Intravenous New Bag/Given 04/19/19 1542)    Followed by  0.9 %  sodium chloride infusion (has no administration in time range)     Initial Impression / Assessment and Plan / ED Course  I have reviewed the triage vital signs and the nursing notes.  Pertinent labs & imaging results that were available during my care of the patient were reviewed by me and considered in my medical decision making (see chart for details).  Patient arrived by EMS with signs and symptoms of an acute stroke.  A code stroke was called during transport and the patient was met in the ambulance bay by the neurology team.  Patient immediately went to radiology for a CT scan of the head.  This was interpreted as unremarkable.  Patient will be admitted to the neurology service after TPA has been initiated.  Final Clinical Impressions(s) / ED Diagnoses   Final diagnoses:  None    ED Discharge Orders    None       Veryl Speak, MD  04/20/19 [phone]

## 2019-04-19 NOTE — Progress Notes (Signed)
Patient's RN called to notify me of a Ping pong ball size hematoma in patient's rt arm. Not noted prior. Advised to mark the site and monitor. Labs as before  -- Amie Portland, MD [facility] Pager: [phone] If 7pm to 7am, please call on call as listed on AMION.

## 2019-04-20 ENCOUNTER — Inpatient Hospital Stay (HOSPITAL_COMMUNITY): Payer: Medicare HMO

## 2019-04-20 DIAGNOSIS — I10 Essential (primary) hypertension: Secondary | ICD-10-CM | POA: Diagnosis present

## 2019-04-20 DIAGNOSIS — I63412 Cerebral infarction due to embolism of left middle cerebral artery: Secondary | ICD-10-CM

## 2019-04-20 DIAGNOSIS — I6389 Other cerebral infarction: Secondary | ICD-10-CM

## 2019-04-20 DIAGNOSIS — R54 Age-related physical debility: Secondary | ICD-10-CM | POA: Diagnosis present

## 2019-04-20 DIAGNOSIS — E785 Hyperlipidemia, unspecified: Secondary | ICD-10-CM | POA: Diagnosis present

## 2019-04-20 DIAGNOSIS — C9002 Multiple myeloma in relapse: Secondary | ICD-10-CM

## 2019-04-20 LAB — CBC
HCT: 26 % — ABNORMAL LOW (ref 39.0–52.0)
Hemoglobin: 8.6 g/dL — ABNORMAL LOW (ref 13.0–17.0)
MCH: 30.8 pg (ref 26.0–34.0)
MCHC: 33.1 g/dL (ref 30.0–36.0)
MCV: 93.2 fL (ref 80.0–100.0)
Platelets: 172 10*3/uL (ref 150–400)
RBC: 2.79 MIL/uL — ABNORMAL LOW (ref 4.22–5.81)
RDW: 16.1 % — ABNORMAL HIGH (ref 11.5–15.5)
WBC: 4.7 10*3/uL (ref 4.0–10.5)
nRBC: 0 % (ref 0.0–0.2)

## 2019-04-20 LAB — GLUCOSE, CAPILLARY: Glucose-Capillary: 109 mg/dL — ABNORMAL HIGH (ref 70–99)

## 2019-04-20 LAB — BASIC METABOLIC PANEL
Anion gap: 4 — ABNORMAL LOW (ref 5–15)
BUN: 48 mg/dL — ABNORMAL HIGH (ref 8–23)
CO2: 12 mmol/L — ABNORMAL LOW (ref 22–32)
Calcium: 7.8 mg/dL — ABNORMAL LOW (ref 8.9–10.3)
Chloride: 121 mmol/L — ABNORMAL HIGH (ref 98–111)
Creatinine, Ser: 1.85 mg/dL — ABNORMAL HIGH (ref 0.61–1.24)
GFR calc Af Amer: 37 mL/min — ABNORMAL LOW (ref >60)
GFR calc non Af Amer: 32 mL/min — ABNORMAL LOW (ref >60)
Glucose, Bld: 93 mg/dL (ref 70–99)
Potassium: 4.9 mmol/L (ref 3.5–5.1)
Sodium: 137 mmol/L (ref 135–145)

## 2019-04-20 LAB — LIPID PANEL
Cholesterol: 148 mg/dL (ref 0–200)
HDL: 58 mg/dL (ref >40)
LDL Cholesterol: 71 mg/dL (ref 0–99)
Total CHOL/HDL Ratio: 2.6 RATIO
Triglycerides: 96 mg/dL (ref <150)
VLDL: 19 mg/dL (ref 0–40)

## 2019-04-20 LAB — ECHOCARDIOGRAM COMPLETE
Height: 70 in
Weight: 2229.29 oz

## 2019-04-20 MED ORDER — SODIUM CHLORIDE 0.9% FLUSH: 3.0000 mL | INTRAVENOUS | Status: DC | PRN

## 2019-04-20 MED ORDER — HEPARIN SOD (PORK) LOCK FLUSH 100 UNIT/ML IV SOLN: 500.0000 [IU] | Freq: Every day | INTRAVENOUS | Status: DC | PRN

## 2019-04-20 MED ORDER — FINASTERIDE 5 MG PO TABS
5.0000 mg | ORAL_TABLET | Freq: Every day | ORAL | Status: DC
  Administered 2019-04-20 – 2019-04-21 (×2): 5 mg via ORAL
  Filled 2019-04-20 (×2): qty 1

## 2019-04-20 MED ORDER — VITAMIN B-12 100 MCG PO TABS
500.0000 ug | ORAL_TABLET | Freq: Every day | ORAL | Status: DC
  Administered 2019-04-20 – 2019-04-21 (×2): 500 ug via ORAL
  Filled 2019-04-20 (×2): qty 5

## 2019-04-20 MED ORDER — VITAMIN D (ERGOCALCIFEROL) 1.25 MG (50000 UNIT) PO CAPS: 50000.0000 [IU] | ORAL_CAPSULE | ORAL | Status: DC

## 2019-04-20 MED ORDER — SODIUM CHLORIDE 0.9% FLUSH: 10.0000 mL | INTRAVENOUS | Status: DC | PRN

## 2019-04-20 MED ORDER — PRAVASTATIN SODIUM 10 MG PO TABS: 20.0000 mg | ORAL_TABLET | Freq: Every day | ORAL | Status: DC

## 2019-04-20 MED ORDER — HEPARIN SOD (PORK) LOCK FLUSH 100 UNIT/ML IV SOLN: 250.0000 [IU] | INTRAVENOUS | Status: DC | PRN

## 2019-04-20 MED ORDER — FUROSEMIDE 10 MG/ML IJ SOLN: 20.0000 mg | Freq: Once | INTRAMUSCULAR | Status: DC

## 2019-04-20 MED ORDER — CLOPIDOGREL BISULFATE 75 MG PO TABS
75.0000 mg | ORAL_TABLET | Freq: Every day | ORAL | Status: DC
  Administered 2019-04-20 – 2019-04-21 (×2): 75 mg via ORAL
  Filled 2019-04-20 (×2): qty 1

## 2019-04-20 MED ORDER — ASPIRIN EC 81 MG PO TBEC
81.0000 mg | DELAYED_RELEASE_TABLET | Freq: Every day | ORAL | Status: DC
  Administered 2019-04-20 – 2019-04-21 (×2): 81 mg via ORAL
  Filled 2019-04-20 (×2): qty 1

## 2019-04-20 MED ORDER — SODIUM CHLORIDE 0.9% IV SOLUTION: 250.0000 mL | Freq: Once | INTRAVENOUS | Status: DC

## 2019-04-20 MED ORDER — VITAMIN B-6 50 MG PO TABS
100.0000 mg | ORAL_TABLET | Freq: Every day | ORAL | Status: DC
  Administered 2019-04-20 – 2019-04-21 (×2): 100 mg via ORAL
  Filled 2019-04-20 (×2): qty 2

## 2019-04-20 NOTE — Progress Notes (Signed)
STROKE TEAM PROGRESS NOTE   INTERVAL HISTORY Pt sitting in bed, having breakfast.  He stated that yesterday he cannot able to get words out, however, overnight much improved, this morning he felt he is almost at his baseline speaking.  Denies any arm or leg weakness.  Vitals:   04/20/19 0700 04/20/19 0800 04/20/19 0900 04/20/19 1000  BP: (!) 126/104 (!) 163/88 (!) 151/77 (!) 159/79  Pulse: 79 85 78 87  Resp: 14 20 (!) 21 19  Temp:  (!) 97.5 F (36.4 C)    TempSrc:  Oral    SpO2: 100% 100% 100% 100%  Weight:      Height:        CBC:  Recent Labs  Lab 04/15/19 0918  04/19/19 1528 04/19/19 1532 04/20/19 0537  WBC 6.1  --  7.4  --  4.7  NEUTROABS 4.0  --  4.9  --   --   HGB 9.9*   < > 9.3* 9.2* 8.6*  HCT 30.1*  --  28.5* 27.0* 26.0*  MCV 92.0  --  93.1  --  93.2  PLT 209  --  202  --  172   < > = values in this interval not displayed.    Basic Metabolic Panel:  Recent Labs  Lab 04/19/19 1702 04/20/19 0537  NA 135 137  K 5.9* 4.9  CL 117* 121*  CO2 14* 12*  GLUCOSE 102* 93  BUN 55* 48*  CREATININE 2.13* 1.85*  CALCIUM 8.2* 7.8*   Lipid Panel:     Component Value Date/Time   CHOL 148 04/20/2019 0537   TRIG 96 04/20/2019 0537   HDL 58 04/20/2019 0537   CHOLHDL 2.6 04/20/2019 0537   VLDL 19 04/20/2019 0537   LDLCALC 71 04/20/2019 0537   HgbA1c: No results found for: HGBA1C Urine Drug Screen: No results found for: LABOPIA, COCAINSCRNUR, LABBENZ, AMPHETMU, THCU, LABBARB  Alcohol Level No results found for: [facility]  IMAGING Dg Chest Port 1 View  Result Date: 04/19/2019 CLINICAL DATA:  Aspiration into airway EXAM: PORTABLE CHEST 1 VIEW COMPARISON:  Chest radiographs 09/01/2018, 10/06/2018 FINDINGS: The the cardiomediastinal contours are within normal limits. Chronic bilateral coarse interstitial markings. No definite new focal infiltrate. No pneumothorax or large pleural effusion. No acute finding in the visualized skeleton. IMPRESSION: No new focal infiltrate  identified. Electronically Signed   By: Audie Pinto M.D.   On: 04/19/2019 17:30   Ct Head Code Stroke Wo Contrast  Result Date: 04/19/2019 CLINICAL DATA:  Code stroke. Code stroke. Slurred speech. Left-sided weakness. EXAM: CT HEAD WITHOUT CONTRAST TECHNIQUE: Contiguous axial images were obtained from the base of the skull through the vertex without intravenous contrast. COMPARISON:  MRI face 05/12/2017 FINDINGS: Brain: In age indeterminate lacunar infarct is present in the anterior limb of the right internal capsule adjacent to the right caudate head. Asymmetric white matter attenuation is present in the subinsular white matter on the right as well. Other scattered subcortical white matter changes are present bilaterally. Basal ganglia are otherwise intact. Cortex is unremarkable. Insert brainstem Vascular: Atherosclerotic calcifications are present within the cavernous internal carotid arteries bilaterally. There is no asymmetric hyperdense vessel. Skull: Calvarium is intact. No focal lytic or blastic lesions are present. Sinuses/Orbits: The paranasal sinuses and mastoid air cells are clear. Bilateral lens replacements are noted. Globes and orbits are otherwise unremarkable. ASPECTS [facility] Stroke Program Early CT Score) - Ganglionic level infarction (caudate, lentiform nuclei, internal capsule, insula, M1-M3 cortex): 6/7 - Supraganglionic infarction (M4-M6  cortex): 3/3 Total score (0-10 with 10 being normal): 9/10 IMPRESSION: 1. Age indeterminate lacunar infarct involving the anterior limb of the right internal capsule and subinsular white matter. 2. Mild generalized white matter likely reflects the sequela of chronic microvascular ischemia. 3. ASPECTS is 9/10 Electronically Signed   By: San Morelle M.D.   On: 04/19/2019 15:52   Carotid (at [place] [facility] Only)  Result Date: 04/20/2019 Carotid Arterial Duplex Study Indications:       CVA. Risk Factors:      Hypertension. Comparison Study:  no  prior Performing Technologist: June Leap RDMS, RVT  Examination Guidelines: A complete evaluation includes B-mode imaging, spectral Doppler, color Doppler, and power Doppler as needed of all accessible portions of each vessel. Bilateral testing is considered an integral part of a complete examination. Limited examinations for reoccurring indications may be performed as noted.  Right Carotid Findings: +----------+--------+--------+--------+------------------+--------+           PSV cm/sEDV cm/sStenosisPlaque DescriptionComments +----------+--------+--------+--------+------------------+--------+ CCA Prox  81      16                                         +----------+--------+--------+--------+------------------+--------+ CCA Distal86      13                                         +----------+--------+--------+--------+------------------+--------+ ICA Prox  67      12      1-39%   homogeneous                +----------+--------+--------+--------+------------------+--------+ ICA Distal85      19                                         +----------+--------+--------+--------+------------------+--------+ ECA       115     4                                          +----------+--------+--------+--------+------------------+--------+ +----------+--------+-------+----------------+-------------------+           PSV cm/sEDV cmsDescribe        Arm Pressure (mmHG) +----------+--------+-------+----------------+-------------------+ Subclavian170            Multiphasic, WNL                    +----------+--------+-------+----------------+-------------------+ +---------+--------+--+--------+-+---------+ VertebralPSV cm/s49EDV cm/s2Antegrade +---------+--------+--+--------+-+---------+  Left Carotid Findings: +----------+--------+--------+--------+------------------+--------+           PSV cm/sEDV cm/sStenosisPlaque DescriptionComments  +----------+--------+--------+--------+------------------+--------+ CCA Prox  122     20                                         +----------+--------+--------+--------+------------------+--------+ CCA Distal106     9                                          +----------+--------+--------+--------+------------------+--------+ ICA Prox  124  20      1-39%   homogeneous                +----------+--------+--------+--------+------------------+--------+ ICA Distal85      17                                         +----------+--------+--------+--------+------------------+--------+ ECA       123     9                                          +----------+--------+--------+--------+------------------+--------+ +----------+--------+--------+----------------+-------------------+           PSV cm/sEDV cm/sDescribe        Arm Pressure (mmHG) +----------+--------+--------+----------------+-------------------+ [id number]             Multiphasic, WNL                    +----------+--------+--------+----------------+-------------------+ +---------+--------+--+--------+--+---------+ VertebralPSV cm/s77EDV cm/s11Antegrade +---------+--------+--+--------+--+---------+  Summary: Right Carotid: Velocities in the right ICA are consistent with a 1-39% stenosis. Left Carotid: Velocities in the left ICA are consistent with a 1-39% stenosis.  *See table(s) above for measurements and observations.     Preliminary    2D Echocardiogram 1. The left ventricle has a visually estimated ejection fraction of 55%. The cavity size was normal. There is mildly increased left ventricular wall thickness. Left ventricular diastolic Doppler parameters are consistent with impaired relaxation. Basal  inferior hypokinesis.  2. No evidence of mitral valve stenosis. Trivial mitral regurgitation.  3. The aortic valve is tricuspid. Mild calcification of the aortic valve. Aortic valve regurgitation is  trivial by color flow Doppler. No stenosis of the aortic valve.  4. The aortic root is normal in size and structure.  5. The right ventricle has normal systolic function. The cavity was normal. There is no increase in right ventricular wall thickness.  6. Normal IVC size. No complete TR doppler jet so unable to estimate PA systolic pressure.   PHYSICAL EXAM  Temp:  [97.5 F (36.4 C)-98.2 F (36.8 C)] 98 F (36.7 C) (09/08 1656) Pulse Rate:  [35-99] 77 (09/08 1656) Resp:  [13-26] 16 (09/08 1656) BP: (122-174)/(55-104) 147/78 (09/08 1656) SpO2:  [98 %-100 %] 98 % (09/08 1656)  General - Well nourished, well developed, in no apparent distress.  Ophthalmologic - fundi not visualized due to noncooperation.  Cardiovascular - Regular rate and rhythm.  Mental Status -  Level of arousal and orientation to time, place, and person were intact. Language including expression, naming, repetition, comprehension was assessed and found intact. Fund of Knowledge was assessed and was intact.  Cranial Nerves II - XII - II - Visual field intact OU. III, IV, VI - Extraocular movements intact. V - Facial sensation intact bilaterally. VII - Facial movement intact bilaterally. VIII - Hearing & vestibular intact bilaterally. X - Palate elevates symmetrically. XI - Chin turning & shoulder shrug intact bilaterally. XII - Tongue protrusion intact.  Motor Strength - The patient's strength was normal in all extremities and pronator drift was absent.  Bulk was normal and fasciculations were absent.   Motor Tone - Muscle tone was assessed at the neck and appendages and was normal.  Reflexes - The patient's reflexes were symmetrical in all extremities and he had no pathological reflexes.  Sensory -  Light touch, temperature/pinprick were assessed and were symmetrical.    Coordination - The patient had normal movements in the hands with no ataxia or dysmetria.  Tremor was absent.  Gait and Station -  deferred.   ASSESSMENT/PLAN Christian Burns is a 85 y.o. male with history of multiple myeloma status post radiation currently on chemotherapy, hypertension, history of radiation therapy, chronic kidney disease presenting from ALF with difficulty speaking R facial droop.  Received TPA 04/19/2019 at 1542.  Stroke:  left frontal cortical tiny infarct s/p tPA -  Embolic, concerning for cardioembolic source  Code Stroke CT head age indeterminate R ALIC lacune and subinsular infarct. Small vessel disease. ASPECTS 9/10    MRI  Left frontal cortical tiny infarct   MRA  unremarkable  CT head age indetermined lacunar infarct at anterior limb of the right internal capsule  Carotid Doppler  B ICA 1-39% stenosis, VAs antegrade   2D Echo EF 55%. No source of embolus   Recommend loop recorder placement to rule out A. fib  LDL 71  HgbA1c pending   SCDs for VTE prophylaxis  No antithrombotic prior to admission, now on ASA 81 and plavix 11m.  Continue DAPT for 3 weeks and then aspirin alone.  Therapy recommendations:  pending .  Okay to be out of bed  Disposition:  pending   Hypertension  Home meds: Amlodipine-benazepril 5-10 daily  Permissive hypertension (OK if BP < 180/105) for 24-48 hours and then gradually normalize in 3-5 days. . Stable . 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 This  patient's care requires constant monitoring of vital signs, hemodynamics, respiratory and cardiac monitoring, review of multiple databases, neurological assessment, discussion with family, other specialists and medical decision making of high complexity. I spent 35 minutes of neurocritical care time in the care of this patient.  JRosalin Hawking MD PhD Stroke Neurology 04/20/2019 6:40 PM    To contact Stroke Continuity provider, please refer to [email or web address] After hours, contact General Neurology

## 2019-04-20 NOTE — Evaluation (Signed)
Physical Therapy Evaluation Patient Details Name: Christian Burns MRN: [id number] DOB: [birth year] Today's Date: 04/20/2019   History of Present Illness  85 y.o. male with history of multiple myeloma status post radiation currently on chemotherapy, hypertension, history of radiation therapy, chronic kidney disease who presented to the ED secondary to difficulty speaking and right facial droop. TPA was administered. CT of the head revealed age indeterminate lacunar infarct involving the anterior limb of the right internal capsule and subinsular white matter.   Clinical Impression  Pt admitted with above. Pt presenting near baseline. Pt with mild R UE and LE weakness but overall functioning at a min guard level. Pt with good home set up and support. Recommend continued HHPT (pt reports working with a PT PTA). Acute PT to cont to follow.    Follow Up Recommendations Home health PT;Supervision/Assistance - 24 hour    Equipment Recommendations  None recommended by PT    Recommendations for Other Services       Precautions / Restrictions Precautions Precautions: Fall Restrictions Weight Bearing Restrictions: No      Mobility  Bed Mobility Overal bed mobility: Needs Assistance Bed Mobility: Supine to Sit     Supine to sit: Supervision     General bed mobility comments: Pt was able to sit up and move to EOB with supervision for safety  Transfers Overall transfer level: Needs assistance Equipment used: None Transfers: Sit to/from Stand Sit to Stand: Min guard         General transfer comment: Min Guard A for safety  Ambulation/Gait Ambulation/Gait assistance: Counsellor (Feet): 230 Feet Assistive device: None Gait Pattern/deviations: Step-through pattern;Decreased stride length;Trunk flexed Gait velocity: slow Gait velocity interpretation: <1.31 ft/sec, indicative of household ambulator General Gait Details: pt with short step length and step height, no episodes  of LOB  Stairs            Wheelchair Mobility    Modified Rankin (Stroke Patients Only) Modified Rankin (Stroke Patients Only) Pre-Morbid Rankin Score: Slight disability Modified Rankin: Moderate disability     Balance Overall balance assessment: Needs assistance Sitting-balance support: No upper extremity supported;Feet supported Sitting balance-Leahy Scale: Good     Standing balance support: No upper extremity supported;During functional activity Standing balance-Leahy Scale: Fair Standing balance comment: observed pt at sink with OT brushing teeth, didn't require UE support                             Pertinent Vitals/Pain Pain Assessment: No/denies pain    Home Living Family/patient expects to be discharged to:: Assisted living([facility])   Available Help at Discharge: Family Type of Home: Assisted living(apartment on 2nd floor of facility, with elevator access)         Home Equipment: Bedside commode;Walker - 2 wheels;Cane - single point;Grab bars - tub/shower;Shower seat;Grab bars - toilet Additional Comments: lives with wife in [facility]    Prior Function Level of Independence: Independent with assistive device(s)         Comments: pt uses RW normally at baseline; no A needed for bathing/dressing     Hand Dominance   Dominant Hand: Left    Extremity/Trunk Assessment   Upper Extremity Assessment Upper Extremity Assessment: Defer to OT evaluation RUE Deficits / Details: RUE slightly weaker than left. Demonstrating functional use of RUE to perform grooming tasks    Lower Extremity Assessment Lower Extremity Assessment: RLE deficits/detail RLE Deficits / Details: pt generally  4/5, pt reports having a rod placed a year ago but doesn't remember why, denies having a hip fx    Cervical / Trunk Assessment Cervical / Trunk Assessment: Normal  Communication   Communication: No difficulties  Cognition Arousal/Alertness:  Awake/alert Behavior During Therapy: WFL for tasks assessed/performed Overall Cognitive Status: Impaired/Different from baseline Area of Impairment: Safety/judgement                         Safety/Judgement: Decreased awareness of safety     General Comments: Pt requiring Min cues for safety. Oriented to place, time, situation. Able to follow commands. Feel he is close to baselien cognition.       General Comments General comments (skin integrity, edema, etc.): VSS    Exercises     Assessment/Plan    PT Assessment Patient needs continued PT services  PT Problem List Decreased strength;Decreased activity tolerance;Decreased balance;Decreased mobility;Decreased coordination       PT Treatment Interventions DME instruction;Gait training;Functional mobility training;Therapeutic activities;Therapeutic exercise;Balance training;Neuromuscular re-education;Cognitive remediation    PT Goals (Current goals can be found in the Care Plan section)  Acute Rehab PT Goals Patient Stated Goal: "Go home tomorrow, I hope." PT Goal Formulation: With patient Time For Goal Achievement: 05/04/19 Potential to Achieve Goals: Good Additional Goals Additional Goal #1: Pt to score >19 on DGI to demonstrate minimal falls risk.    Frequency Min 4X/week   Barriers to discharge        Co-evaluation PT/OT/SLP Co-Evaluation/Treatment: Yes Reason for Co-Treatment: Complexity of the patient's impairments (multi-system involvement) PT goals addressed during session: Mobility/safety with mobility OT goals addressed during session: ADL's and self-care       AM-PAC PT "6 Clicks" Mobility  Outcome Measure Help needed turning from your back to your side while in a flat bed without using bedrails?: None Help needed moving from lying on your back to sitting on the side of a flat bed without using bedrails?: None Help needed moving to and from a bed to a chair (including a wheelchair)?: A  Little Help needed standing up from a chair using your arms (e.g., wheelchair or bedside chair)?: A Little Help needed to walk in hospital room?: A Little Help needed climbing 3-5 steps with a railing? : A Little 6 Click Score: 20    End of Session Equipment Utilized During Treatment: Gait belt Activity Tolerance: Patient tolerated treatment well Patient left: in chair;with call bell/phone within reach;with chair alarm set Nurse Communication: Mobility status PT Visit Diagnosis: Unsteadiness on feet (R26.81);Difficulty in walking, not elsewhere classified (R26.2)    Time: 2992-4268 PT Time Calculation (min) (ACUTE ONLY): 29 min   Charges:   PT Evaluation $PT Eval Moderate Complexity: 1 Mod          Kittie Plater, PT, DPT Acute Rehabilitation Services Pager #: [phone] Office #: [phone]   [place] Lopes 04/20/2019, 2:25 PM

## 2019-04-20 NOTE — Evaluation (Signed)
Occupational Therapy Evaluation Patient Details Name: Christian Burns MRN: [id number] DOB: Apr 16, 1930 Today's Date: 04/20/2019    History of Present Illness 85 y.o. male with history of multiple myeloma status post radiation currently on chemotherapy, hypertension, history of radiation therapy, chronic kidney disease who presented to the ED secondary to difficulty speaking and right facial droop. TPA was administered. CT of the head revealed age indeterminate lacunar infarct involving the anterior limb of the right internal capsule and subinsular white matter.    Clinical Impression   PTA, pt was living with his wife with [facility] and was independent with ADLs. Pt currently performing ADLs and functional mobility at [place] level. Pt presenting with decrease strength and balance; however, feel he is close to baseline function. Pt very agreeable to therapy and motivated to return home. Pt would benefit from further acute OT to facilitate safe dc. Recommend dc to home with HHOT for further OT to optimize safety, independence with ADLs, and return to PLOF.      Follow Up Recommendations  Home health OT;Supervision/Assistance - 24 hour    Equipment Recommendations  None recommended by OT    Recommendations for Other Services PT consult     Precautions / Restrictions Precautions Precautions: Fall      Mobility Bed Mobility Overal bed mobility: Needs Assistance Bed Mobility: Supine to Sit     Supine to sit: Supervision     General bed mobility comments: Pt was able to sit up and move to EOB with supervision for safety  Transfers Overall transfer level: Needs assistance Equipment used: None Transfers: Sit to/from Stand Sit to Stand: Min guard         General transfer comment: Min Guard A for safety    Balance Overall balance assessment: Needs assistance Sitting-balance support: No upper extremity supported;Feet supported Sitting balance-Leahy Scale: Good      Standing balance support: No upper extremity supported;During functional activity Standing balance-Leahy Scale: Fair                             ADL either performed or assessed with clinical judgement   ADL Overall ADL's : Needs assistance/impaired Eating/Feeding: Set up;Sitting   Grooming: Min guard;Oral care;Standing;Wash/dry hands Grooming Details (indicate cue type and reason): Min Guard A for safety in standing. Upper Body Bathing: Set up;Sitting   Lower Body Bathing: Min guard;Sit to/from stand   Upper Body Dressing : Set up;Sitting Upper Body Dressing Details (indicate cue type and reason): Donned gown like a jacket Lower Body Dressing: Min guard;Sit to/from stand   Toilet Transfer: Minimal assistance;Ambulation;Regular Toilet;Grab bars Toilet Transfer Details (indicate cue type and reason): Pt requiring Min A for safe descent to toilet. Min Guard A for safety with standing.  Toileting- Clothing Manipulation and Hygiene: Set up;Supervision/safety;Sitting/lateral lean       Functional mobility during ADLs: Min guard;Minimal assistance General ADL Comments: Pt presenting with decrease balance and strength, but feel he is close to baseline funtion.     Vision Baseline Vision/History: Wears glasses Wears Glasses: Reading only Patient Visual Report: No change from baseline Additional Comments: Denies diplopia or blurry vision. scanning appropiately     Perception     Praxis      Pertinent Vitals/Pain Pain Assessment: No/denies pain     Hand Dominance Left   Extremity/Trunk Assessment Upper Extremity Assessment Upper Extremity Assessment: RUE deficits/detail RUE Deficits / Details: RUE slightly weaker than left. Demonstrating functional  use of RUE to perform grooming tasks   Lower Extremity Assessment Lower Extremity Assessment: Defer to PT evaluation   Cervical / Trunk Assessment Cervical / Trunk Assessment: Normal   Communication  Communication Communication: No difficulties   Cognition Arousal/Alertness: Awake/alert Behavior During Therapy: WFL for tasks assessed/performed Overall Cognitive Status: Impaired/Different from baseline Area of Impairment: Safety/judgement                         Safety/Judgement: Decreased awareness of safety     General Comments: Pt requiring Min cues for safety. Oriented to place, time, situation. Able to follow commands. Feel he is close to baselien cognition.    General Comments  VSS    Exercises     Shoulder Instructions      Home Living Family/patient expects to be discharged to:: Assisted living([facility])   Available Help at Discharge: Family Type of Home: Assisted living(apartment on 2nd floor of facility, with elevator access)                       Home Equipment: Bedside commode;Walker - 2 wheels;Cane - single point;Grab bars - tub/shower;Shower seat;Grab bars - toilet   Additional Comments: lives with wife in [place]  Lives With: Spouse    Prior Functioning/Environment Level of Independence: Independent with assistive device(s)                 OT Problem List: Decreased strength;Decreased cognition;Decreased knowledge of precautions;Decreased knowledge of use of DME or AE      OT Treatment/Interventions: Self-care/ADL training;Therapeutic exercise;Energy conservation;DME and/or AE instruction;Therapeutic activities;Patient/family education;Balance training    OT Goals(Current goals can be found in the care plan section) Acute Rehab OT Goals Patient Stated Goal: "Go home tomorrow, I hope." OT Goal Formulation: With patient Time For Goal Achievement: 05/04/19 Potential to Achieve Goals: Good  OT Frequency: Min 2X/week   Barriers to D/C:            Co-evaluation PT/OT/SLP Co-Evaluation/Treatment: Yes Reason for Co-Treatment: For patient/therapist safety;To address functional/ADL transfers   OT goals addressed  during session: ADL's and self-care      AM-PAC OT "6 Clicks" Daily Activity     Outcome Measure Help from another person eating meals?: None Help from another person taking care of personal grooming?: A Little Help from another person toileting, which includes using toliet, bedpan, or urinal?: A Little Help from another person bathing (including washing, rinsing, drying)?: A Little Help from another person to put on and taking off regular upper body clothing?: A Little Help from another person to put on and taking off regular lower body clothing?: A Little 6 Click Score: 19   End of Session Equipment Utilized During Treatment: Gait belt Nurse Communication: Mobility status  Activity Tolerance: Patient tolerated treatment well Patient left: in chair;with call bell/phone within reach;with chair alarm set  OT Visit Diagnosis: Unsteadiness on feet (R26.81);Other abnormalities of gait and mobility (R26.89);Muscle weakness (generalized) (M62.81);Other symptoms and signs involving cognitive function                Time: 6387-5643 OT Time Calculation (min): 22 min Charges:  OT General Charges $OT Visit: 1 Visit OT Evaluation $OT Eval Moderate Complexity: [place], OTR/L Acute Rehab Pager: [phone] Office: [place] 04/20/2019, 1:23 PM

## 2019-04-20 NOTE — Evaluation (Addendum)
Speech Language Pathology Evaluation Patient Details Name: Christian Burns MRN: [id number] DOB: March 24, 1930 Today's Date: 04/20/2019 Time: 2423-5361 SLP Time Calculation (min) (ACUTE ONLY): 21 min  Problem List:  Patient Active Problem List   Diagnosis Date Noted  . Stroke ([place]) 04/19/2019  . Community acquired pneumonia 03/03/2019  . AKI (acute kidney injury) ([place]) 03/03/2019  . Normocytic anemia 03/03/2019  . Goals of care, counseling/discussion 02/04/2019  . Acute osteomyelitis of jaw 11/26/2018  . Bacteremia   . 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 Sepsis due to pneumonia ([place])   . Left lower lobe pneumonia ([place]) 10/06/2018  . Right hip pain 09/01/2018  . Iron deficiency anemia secondary to inadequate dietary iron intake 07/18/2016  . Multiple myeloma in relapse ([place]) 04/05/2016  . 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 Multiple myeloma in remission ([place]) 09/08/2015  . Myeloma ([place]) 08/23/2011   Past Medical History:  Past Medical History:  Diagnosis Date  . CKD (chronic kidney disease) 07/06/2018   CKD stage III  . Goals of care, counseling/discussion 02/04/2019  . History of radiation therapy 06/17/13-07/06/13   35 gray to upper lumbar spine  . 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 Hypertension   . Inguinal hernia   . Multiple myeloma [facility]) Sept 2010  . Multiple myeloma in relapse ([place]) 04/05/2016  . Radiation 09/26/15-10/20/15   lower thoracic spine, upper lumbar spine 30 gray   Past Surgical History:  Past Surgical History:  Procedure Laterality Date  . COLONOSCOPY    . HIP PINNING,CANNULATED Right 09/01/2018   Procedure: [facility] NAIL HIP PINNING;  Surgeon: Melrose Nakayama, MD;  Location: [facility];  Service: Orthopedics;  Laterality: Right;  . TEE WITHOUT CARDIOVERSION N/A 10/09/2018   Procedure: TRANSESOPHAGEAL ECHOCARDIOGRAM (TEE);  Surgeon: Acie Fredrickson Wonda Cheng, MD;  Location: [facility] ENDOSCOPY;  Service: Cardiovascular;  Laterality: N/A;  .  TONSILLECTOMY     HPI:  Pt is a 85 y.o. male with history of multiple myeloma status post radiation currently on chemotherapy, hypertension, history of radiation therapy, chronic kidney disease who presented to the ED secondary to difficulty speaking and right facial droop. TPA was administered. CT of the head revealed age indeterminate lacunar infarct involving the anterior limb of the right internal capsule and subinsular white matter.   Assessment / Plan / Recommendation Clinical Impression  Pt resides in an assisted living facility and denied any significant baseline deficits in speech, language or cognition but stated that his memory has declined some with age. Pt reported that his language skills have returned to baseline but indicated that he is still not "enunciating" his words like he did prior. Per the pt his speech is approximately 80% back to baseline. His language skills are currently within normal limits and no overt cognitive-linguistic deficits were noted. He did demonstrate mild dysarthria characterized by reduced articulatory precision and a reduced vocal intensity which inconsistently impacted speech intelligibility during conversation. Skilled SLP services are clinically indicated at this time to improve dysarthria. Pt and nursing were educated regarding results and recommendations; both parties verbalized understanding as well as agreement with plan of care.    SLP Assessment  SLP Recommendation/Assessment: Patient needs continued Speech Lanaguage Pathology Services SLP Visit Diagnosis: Dysarthria and anarthria (R47.1)    Follow Up Recommendations  Other (comment)(Pending pt's progress with acute treatment)    Frequency and Duration min 2x/week  2 weeks      SLP Evaluation Cognition  Overall Cognitive Status: Within Functional Limits for tasks assessed  Arousal/Alertness: Awake/alert Orientation Level: Oriented X4 Attention: Focused;Sustained Focused Attention: Appears  intact Sustained Attention: Appears intact Memory: Impaired Memory Impairment: Storage deficit;Retrieval deficit;Decreased recall of new information(Immediate: 3/3; delayed: 1/3; with cue: 2/2) Awareness: Appears intact Problem Solving: Appears intact Executive Function: Reasoning Reasoning: Appears intact       Comprehension  Auditory Comprehension Overall Auditory Comprehension: Appears within functional limits for tasks assessed Yes/No Questions: Within Functional Limits Basic Biographical Questions: (5/5) Complex Questions: (5/5) Paragraph Comprehension (via yes/no questions): (3/4) Commands: Within Functional Limits Two Step Basic Commands: (4/4) Multistep Basic Commands: (3/3) Conversation: Complex Visual Recognition/Discrimination Discrimination: Within Function Limits Reading Comprehension Reading Status: Not tested(Pt's glasses not at hospital; pt could not see stimuli)    Expression Expression Primary Mode of Expression: Verbal Verbal Expression Overall Verbal Expression: Appears within functional limits for tasks assessed Initiation: No impairment Automatic Speech: Day of week;Month of year(WNL) Level of Generative/Spontaneous Verbalization: Conversation Repetition: No impairment(5/5) Naming: No impairment Responsive: (5/5) Confrontation: Within functional limits(10/10) Convergent: (5/5) Pragmatics: No impairment   Oral / Motor  Oral Motor/Sensory Function Overall Oral Motor/Sensory Function: Mild impairment Lingual ROM: Within Functional Limits Lingual Symmetry: Within Functional Limits Lingual Strength: Reduced;Suspected CN XII (hypoglossal) dysfunction Motor Speech Overall Motor Speech: Impaired Respiration: Within functional limits Phonation: Low vocal intensity Resonance: Within functional limits Articulation: Impaired Level of Impairment: Sentence Intelligibility: Intelligibility reduced Word: 75-100% accurate Phrase: 75-100% accurate Sentence:  75-100% accurate Conversation: 75-100% accurate Motor Planning: Witnin functional limits Motor Speech Errors: Aware;Consistent   Aarika Moon I. Hardin Negus, [place], [place] Office number [phone] Pager [place] 04/20/2019, 9:39 AM

## 2019-04-20 NOTE — Discharge Summary (Addendum)
Stroke Discharge Summary  Patient ID: Christian Burns   MRN: [id number]      DOB: May 23, 1930  Date of Admission: 04/19/2019 Date of Discharge: 04/21/2019  Attending Physician:  Rosalin Hawking, MD, Stroke MD Consultant(s):    Cristopher Peru, MD (electrophysiology)  Patient's PCP:  Leanna Battles, MD  DISCHARGE DIAGNOSIS:  Principal Problem:   Stroke [facility]) Active Problems:   Multiple myeloma in relapse ([place])   Hyperlipidemia LDL goal <70   Essential hypertension   Advanced age   Past Medical History:  Diagnosis Date  . CKD (chronic kidney disease) 07/06/2018   CKD stage III  . Goals of care, counseling/discussion 02/04/2019  . History of radiation therapy 06/17/13-07/06/13   35 gray to upper lumbar spine  . 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 Hypertension   . Inguinal hernia   . Multiple myeloma [facility]) Sept 2010  . Multiple myeloma in relapse ([place]) 04/05/2016  . Radiation 09/26/15-10/20/15   lower thoracic spine, upper lumbar spine 30 gray   Past Surgical History:  Procedure Laterality Date  . COLONOSCOPY    . HIP PINNING,CANNULATED Right 09/01/2018   Procedure: [facility] NAIL HIP PINNING;  Surgeon: Melrose Nakayama, MD;  Location: [place];  Service: Orthopedics;  Laterality: Right;  . TEE WITHOUT CARDIOVERSION N/A 10/09/2018   Procedure: TRANSESOPHAGEAL ECHOCARDIOGRAM (TEE);  Surgeon: Acie Fredrickson Wonda Cheng, MD;  Location: [facility] ENDOSCOPY;  Service: Cardiovascular;  Laterality: N/A;  . TONSILLECTOMY      Allergies as of 04/21/2019      Reactions   Sulfa Antibiotics Itching   Sulfasalazine Itching      Medication List    TAKE these medications   amLODipine-benazepril 5-10 MG capsule Commonly known as: LOTREL TAKE ONE CAPSULE EACH DAY What changed: See the new instructions.   aspirin 81 MG EC tablet Take 1 tablet (81 mg total) by mouth daily. Start taking on: April 22, 2019   azithromycin 500 MG tablet Commonly known as: ZITHROMAX   clopidogrel 75 MG tablet Commonly  known as: PLAVIX Take 1 tablet (75 mg total) by mouth daily for 21 days. Take with aspirin 81 x 21 days then stop plavix and continue aspirin 81 daily Start taking on: April 22, 2019   dextromethorphan-guaiFENesin 30-600 MG 12hr tablet Commonly known as: MUCINEX DM Take 1 tablet by mouth 2 (two) times daily as needed for cough.   dronabinol 5 MG capsule Commonly known as: MARINOL TAKE ONE CAPSULE TWICE DAILY BEFORE MEALS What changed: See the new instructions.   finasteride 5 MG tablet Commonly known as: PROSCAR Take 5 mg by mouth daily.   lidocaine-prilocaine cream Commonly known as: EMLA Apply to affected area once   LORazepam 0.5 MG tablet Commonly known as: Ativan Take 1 tablet (0.5 mg total) by mouth every 6 (six) hours as needed (Nausea or vomiting).   megestrol 400 MG/10ML suspension Commonly known as: MEGACE Take 15 mLs (600 mg total) by mouth daily.   multivitamin with minerals Tabs tablet Take 1 tablet by mouth daily.   ondansetron 8 MG tablet Commonly known as: Zofran Take 1 tablet (8 mg total) by mouth 2 (two) times daily as needed for refractory nausea / vomiting. Start on day 2 after Bendamustine chemotherapy.   penicillin v potassium 500 MG tablet Commonly known as: VEETID Take 500 mg by mouth 4 (four) times daily. #120   polyvinyl alcohol 1.4 % ophthalmic solution Commonly known as: LIQUIFILM TEARS Place 1 drop into both eyes as needed  for dry eyes.   PROBIOTIC PO Take 1 capsule by mouth daily.   prochlorperazine 10 MG tablet Commonly known as: COMPAZINE Take 1 tablet (10 mg total) by mouth every 6 (six) hours as needed (nausea and vomiting).   pyridOXINE 100 MG tablet Commonly known as: VITAMIN B-6 Take 100 mg by mouth daily.   traMADol 50 MG tablet Commonly known as: ULTRAM Take 1 tablet (50 mg total) by mouth every 8 (eight) hours as needed for moderate pain. What changed: See the new instructions.   vitamin B-12 500 MCG  tablet Commonly known as: CYANOCOBALAMIN Take 500 mcg by mouth daily.   Vitamin D (Ergocalciferol) 1.25 MG (50000 UT) Caps capsule Commonly known as: DRISDOL Take 50,000 Units by mouth every Sunday.     Resume all home meds as PTA per Dr. Erlinda Hong. Clarified continuation of Zithromax and PCN.  LABORATORY STUDIES CBC    Component Value Date/Time   WBC 3.8 (L) 04/21/2019 0337   RBC 2.66 (L) 04/21/2019 0337   HGB 8.1 (L) 04/21/2019 0337   HGB 9.9 (L) 04/15/2019 0918   HGB 10.3 (L) 08/07/2017 1153   HGB 11.3 (L) 04/22/2007 1355   HCT 24.5 (L) 04/21/2019 0337   HCT 30.1 (L) 08/07/2017 1153   HCT 31.0 (L) 04/22/2007 1355   PLT 160 04/21/2019 0337   PLT 209 04/15/2019 0918   PLT 207 08/07/2017 1153   PLT 256 04/22/2007 1355   MCV 92.1 04/21/2019 0337   MCV 91 08/07/2017 1153   MCV 94.4 04/22/2007 1355   MCH 30.5 04/21/2019 0337   MCHC 33.1 04/21/2019 0337   RDW 16.0 (H) 04/21/2019 0337   RDW 14.8 08/07/2017 1153   RDW 13.8 04/22/2007 1355   LYMPHSABS 0.1 (L) 04/19/2019 1528   LYMPHSABS 0.5 (L) 08/07/2017 1153   LYMPHSABS 0.5 (L) 04/22/2007 1355   MONOABS 1.2 (H) 04/19/2019 1528   MONOABS 0.3 04/22/2007 1355   EOSABS 0.4 04/19/2019 1528   EOSABS 0.3 08/07/2017 1153   BASOSABS 0.0 04/19/2019 1528   BASOSABS 0.0 08/07/2017 1153   BASOSABS 0.0 04/22/2007 1355   CMP    Component Value Date/Time   NA 136 04/21/2019 0337   NA 146 (H) 08/07/2017 1153   NA 141 01/09/2017 1004   K 4.9 04/21/2019 0337   K 4.6 08/07/2017 1153   K 4.4 01/09/2017 1004   CL 119 (H) 04/21/2019 0337   CL 108 08/07/2017 1153   CO2 11 (L) 04/21/2019 0337   CO2 26 08/07/2017 1153   CO2 22 01/09/2017 1004   GLUCOSE 94 04/21/2019 0337   GLUCOSE 89 08/07/2017 1153   BUN 45 (H) 04/21/2019 0337   BUN 24 (H) 08/07/2017 1153   BUN 23.9 01/09/2017 1004   CREATININE 1.75 (H) 04/21/2019 0337   CREATININE 2.05 (H) 04/15/2019 0918   CREATININE 1.56 (H) 05/26/2018 1508   CREATININE 1.5 (H) 01/09/2017 1004    CALCIUM 7.8 (L) 04/21/2019 0337   CALCIUM 8.8 08/07/2017 1153   CALCIUM 8.8 01/09/2017 1004   PROT 7.0 04/19/2019 1528   PROT 6.3 (L) 08/07/2017 1153   PROT 5.7 (L) 01/09/2017 1004   ALBUMIN 2.6 (L) 04/19/2019 1528   ALBUMIN 2.9 (L) 08/07/2017 1153   ALBUMIN 3.2 (L) 01/09/2017 1004   AST 38 04/19/2019 1528   AST 18 04/15/2019 0918   AST 18 01/09/2017 1004   ALT 17 04/19/2019 1528   ALT 12 04/15/2019 0918   ALT 20 08/07/2017 1153   ALT 12 01/09/2017 1004  ALKPHOS 60 04/19/2019 1528   ALKPHOS 97 (H) 08/07/2017 1153   ALKPHOS 80 01/09/2017 1004   BILITOT 1.1 04/19/2019 1528   BILITOT 0.5 04/15/2019 0918   BILITOT 0.92 01/09/2017 1004   GFRNONAA 34 (L) 04/21/2019 0337   GFRNONAA 28 (L) 04/15/2019 0918   GFRAA 40 (L) 04/21/2019 0337   GFRAA 33 (L) 04/15/2019 0918   COAGS Lab Results  Component Value Date   INR 1.1 04/19/2019   INR 1.3 (H) 03/02/2019   INR 1.1 10/06/2018   Lipid Panel    Component Value Date/Time   CHOL 148 04/20/2019 0537   TRIG 96 04/20/2019 0537   HDL 58 04/20/2019 0537   CHOLHDL 2.6 04/20/2019 0537   VLDL 19 04/20/2019 0537   LDLCALC 71 04/20/2019 0537   HgbA1C  Lab Results  Component Value Date   HGBA1C 5.7 (H) 04/20/2019     SIGNIFICANT DIAGNOSTIC STUDIES  Ct Head Code Stroke Wo Contrast 04/19/2019 1. Age indeterminate lacunar infarct involving the anterior limb of the right internal capsule and subinsular white matter. 2. Mild generalized white matter likely reflects the sequela of chronic microvascular ischemia. 3. ASPECTS is 9/10   Carotid (at [place] Only) 04/20/2019 Right Carotid: Velocities in the right ICA are consistent with a 1-39% stenosis.  Left Carotid: Velocities in the left ICA are consistent with a 1-39% stenosis.    Mr Brain 38 Contrast Mr Angio Head Wo Contrast 04/20/2019 1. No acute intracranial abnormality. 2. Focal lesion in the anterior limb of the right internal capsule is consistent with remote ischemia. 3.  Otherwise normal MRI of the brain for age. 4. Normal variant MRA circle-of-Willis without significant proximal stenosis, aneurysm, or branch vessel occlusion.    2D Echocardiogram 1. The left ventricle has a visually estimated ejection fraction of 55%. The cavity size was normal. There is mildly increased left ventricular wall thickness. Left ventricular diastolic Doppler parameters are consistent with impaired relaxation. Basal  inferior hypokinesis. 2. No evidence of mitral valve stenosis. Trivial mitral regurgitation. 3. The aortic valve is tricuspid. Mild calcification of the aortic valve. Aortic valve regurgitation is trivial by color flow Doppler. No stenosis of the aortic valve. 4. The aortic root is normal in size and structure. 5. The right ventricle has normal systolic function. The cavity was normal. There is no increase in right ventricular wall thickness. 6. Normal IVC size. No complete TR doppler jet so unable to estimate PA systolic pressure.  Dg Chest Port 1 View 04/19/2019 No new focal infiltrate identified.      HISTORY OF PRESENT ILLNESS Christian Burns is a 85 y.o. male with history of multiple myeloma status post radiation currently on chemotherapy, hypertension, history of radiation therapy, chronic kidney disease.  Patient lives in an assisted living.  He was last known well at 2 PM on 04/19/2019 when he went down to talk to a neighbor.  He came back up to the house and wife noted that he was having difficulty speaking and called the staff.  Once staff came it was noted that patient had a right facial droop and called EMS. EMS states that in route patient improved as far as speech.  Apparently prior he was having more difficulty getting his words out.  At the bridge patient states that he does feel improved. He has no history of strokes in the past.  He had not been sick prior to this presentation.  He has been compliant with his medications.  He follows  with Dr. Marin Olp and  oncology. NIHSS 3. mRS 1. He was administered IV tPA and admitted to the neuro ICU.   HOSPITAL COURSE [name] is a 85 y.o. male with history of multiple myelomastatus post radiation currently on chemotherapy, hypertension, history of radiation therapy, chronic kidney disease presenting from ALF with difficulty speaking R facial droop.  Received TPA 04/19/2019 at 1542.  Stroke:  left frontal cortical tiny infarct s/p tPA -  Embolic, concerning for cardioembolic source  Code Stroke CT head age indeterminate R ALIC lacune and subinsular infarct. Small vessel disease. ASPECTS 9/10    MRI  Left frontal cortical tiny infarct   MRA  unremarkable  CT head age indetermined lacunar infarct at anterior limb of the right internal capsule  Carotid Doppler  B ICA 1-39% stenosis, VAs antegrade   2D Echo EF 55%. No source of embolus   loop recorder placement by Dr Lovena Le 04/21/2019 to rule out A. fib  LDL 71  HgbA1c 5.7  No antithrombotic prior to admission, now on ASA 81 and plavix 34m.  Continue DAPT for 3 weeks and then aspirin alone.  Therapy recommendations:  HH PT, OT, SLP  [facility]  Home meds: Amlodipine-benazepril 5-10 daily  Permissive hypertension (OK if BP < 180/105) for 24-48 hours and then gradually normalize in 3-5 days.  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 rate 20, height 5' 10"  (1.778 m), weight 63.2 kg, SpO2 100 %. General - Well nourished, well developed, in no apparent distress.  Ophthalmologic - fundi not visualized due to noncooperation.  Cardiovascular - Regular rate and rhythm.  Mental Status -  Level of arousal and orientation to time, place, and person were intact. Language including expression, naming, repetition, comprehension was assessed and found intact. Fund of Knowledge was assessed and was intact.  Cranial Nerves II - XII - II - Visual field intact OU. III, IV, VI - Extraocular movements intact. V - Facial sensation intact bilaterally. VII - Facial movement intact bilaterally. VIII - Hearing & vestibular intact bilaterally. X - Palate elevates symmetrically. XI - Chin turning & shoulder shrug intact bilaterally. XII - Tongue protrusion intact.  Motor Strength - The patient's strength was normal in all extremities and pronator drift was absent.  Bulk was normal and fasciculations were absent.   Motor Tone - Muscle tone was assessed at the neck and appendages and was normal.  Reflexes - The patient's reflexes were symmetrical in all extremities and he had no pathological reflexes.  Sensory - Light touch, temperature/pinprick were assessed and were symmetrical.    Coordination - The patient had normal movements in the hands with no ataxia or dysmetria.  Tremor was absent.  Gait and Station - deferred.  Discharge Diet   Heart healthy/carb modified thin  liquids  DISCHARGE PLAN  Disposition:  return to [facility]  Home health PT, OT, SLP  aspirin 81 mg daily and clopidogrel 75 mg daily for secondary stroke prevention for 3 weeks then ASPIRIN alone.  Ongoing stroke risk factor control by Primary Care Physician at time of discharge  Follow-up Leanna Battles, MD in 2 weeks.  Follow-up in [facility] in 4 weeks, office to  schedule an appointment.   30 minutes were spent preparing discharge.  Rosalin Hawking, MD PhD Stroke Neurology 04/21/2019 8:08 PM

## 2019-04-20 NOTE — Progress Notes (Signed)
  Echocardiogram 2D Echocardiogram has been performed.  Christian Burns 04/20/2019, 8:45 AM

## 2019-04-20 NOTE — Progress Notes (Signed)
Carotid duplex       has been completed. Preliminary results can be found under CV proc through chart review. Gaylon Bentz, BS, RDMS, RVT   

## 2019-04-20 NOTE — Progress Notes (Signed)
  Speech Language Pathology Treatment: Cognitive-Linquistic(Dysarthria)  Patient Details Name: Christian Burns MRN: [id number] DOB: 11-Jan-1930 Today's Date: 04/20/2019 Time: 1062-6948 SLP Time Calculation (min) (ACUTE ONLY): 15 min  Assessment / Plan / Recommendation Clinical Impression  Pt was seen for dysarthria treatment and was cooperative during the session. He was educated regarding the nature of dysarthria and compensatory strategies to improve speech intelligibility. The dysarthria handout was provided to assist with education but pt reported that he could not read the print since his glasses are not at the hospital. Pt verbalized understanding regarding all areas of education and all his questions were answered. He used compensatory strategies at the sentence level with 60% accuracy increasing to 100% accuracy with min-mod cues for overarticulation and vocal intensity. He required moderate cues during conversation for rate and overarticulation. SLP will continue to follow pt.     HPI HPI: Pt is a 85 y.o. male with history of multiple myeloma status post radiation currently on chemotherapy, hypertension, history of radiation therapy, chronic kidney disease who presented to the ED secondary to difficulty speaking and right facial droop. TPA was administered. CT of the head revealed age indeterminate lacunar infarct involving the anterior limb of the right internal capsule and subinsular white matter.      SLP Plan  Continue with current plan of care  Patient needs continued Speech Lanaguage Pathology Services    Recommendations                   Follow up Recommendations: Other (comment)(Pending pt's progress with acute treatment) SLP Visit Diagnosis: Dysarthria and anarthria (R47.1) Plan: Continue with current plan of care       Christian Burns I. Hardin Negus, [place], [place] Office number [phone] Pager [place] 04/20/2019, 9:44 AM

## 2019-04-21 ENCOUNTER — Encounter (HOSPITAL_COMMUNITY)
Admission: EM | Disposition: A | Discharge status: Home Health | Payer: Self-pay | Source: Home / Self Care | Attending: Neurology

## 2019-04-21 DIAGNOSIS — I6389 Other cerebral infarction: Secondary | ICD-10-CM

## 2019-04-21 HISTORY — PX: LOOP RECORDER INSERTION: EP1214

## 2019-04-21 LAB — BASIC METABOLIC PANEL
Anion gap: 6 (ref 5–15)
BUN: 45 mg/dL — ABNORMAL HIGH (ref 8–23)
CO2: 11 mmol/L — ABNORMAL LOW (ref 22–32)
Calcium: 7.8 mg/dL — ABNORMAL LOW (ref 8.9–10.3)
Chloride: 119 mmol/L — ABNORMAL HIGH (ref 98–111)
Creatinine, Ser: 1.75 mg/dL — ABNORMAL HIGH (ref 0.61–1.24)
GFR calc Af Amer: 40 mL/min — ABNORMAL LOW (ref >60)
GFR calc non Af Amer: 34 mL/min — ABNORMAL LOW (ref >60)
Glucose, Bld: 94 mg/dL (ref 70–99)
Potassium: 4.9 mmol/L (ref 3.5–5.1)
Sodium: 136 mmol/L (ref 135–145)

## 2019-04-21 LAB — GLUCOSE, CAPILLARY
Glucose-Capillary: 85 mg/dL (ref 70–99)
Glucose-Capillary: 86 mg/dL (ref 70–99)
Glucose-Capillary: 174 mg/dL — ABNORMAL HIGH (ref 70–99)

## 2019-04-21 LAB — CBC
HCT: 24.5 % — ABNORMAL LOW (ref 39.0–52.0)
Hemoglobin: 8.1 g/dL — ABNORMAL LOW (ref 13.0–17.0)
MCH: 30.5 pg (ref 26.0–34.0)
MCHC: 33.1 g/dL (ref 30.0–36.0)
MCV: 92.1 fL (ref 80.0–100.0)
Platelets: 160 10*3/uL (ref 150–400)
RBC: 2.66 MIL/uL — ABNORMAL LOW (ref 4.22–5.81)
RDW: 16 % — ABNORMAL HIGH (ref 11.5–15.5)
WBC: 3.8 10*3/uL — ABNORMAL LOW (ref 4.0–10.5)
nRBC: 0 % (ref 0.0–0.2)

## 2019-04-21 LAB — HEMOGLOBIN A1C
Hgb A1c MFr Bld: 5.7 % — ABNORMAL HIGH (ref 4.8–5.6)
Mean Plasma Glucose: 117 mg/dL

## 2019-04-21 SURGERY — LOOP RECORDER INSERTION

## 2019-04-21 MED ORDER — PANTOPRAZOLE SODIUM 40 MG PO TBEC: 40.0000 mg | DELAYED_RELEASE_TABLET | Freq: Every day | ORAL | Status: DC

## 2019-04-21 MED ORDER — ASPIRIN 81 MG PO TBEC: 81.0000 mg | DELAYED_RELEASE_TABLET | Freq: Every day | ORAL | Status: DC

## 2019-04-21 MED ORDER — ACETAMINOPHEN 325 MG PO TABS: 325.0000 mg | ORAL_TABLET | ORAL | Status: DC | PRN

## 2019-04-21 MED ORDER — LIDOCAINE-EPINEPHRINE 1 %-1:100000 IJ SOLN
INTRAMUSCULAR | Status: DC | PRN
  Administered 2019-04-21: 5 mL via INTRADERMAL

## 2019-04-21 MED ORDER — ONDANSETRON HCL 4 MG/2ML IJ SOLN: 4.0000 mg | Freq: Four times a day (QID) | INTRAMUSCULAR | Status: DC | PRN

## 2019-04-21 MED ORDER — CLOPIDOGREL BISULFATE 75 MG PO TABS: 75.0000 mg | ORAL_TABLET | Freq: Every day | ORAL | 0 refills | Status: AC

## 2019-04-21 MED ORDER — LIDOCAINE-EPINEPHRINE 1 %-1:100000 IJ SOLN
INTRAMUSCULAR | Status: AC
  Filled 2019-04-21: qty 1

## 2019-04-21 MED ORDER — DM-GUAIFENESIN ER 30-600 MG PO TB12: 1.0000 | ORAL_TABLET | Freq: Two times a day (BID) | ORAL | Status: DC | PRN

## 2019-04-21 MED ORDER — TRAMADOL HCL 50 MG PO TABS: 50.0000 mg | ORAL_TABLET | Freq: Three times a day (TID) | ORAL | Status: DC | PRN

## 2019-04-21 SURGICAL SUPPLY — 2 items
MONITOR REVEAL LINQ II (Prosthesis & Implant Heart) ×2 IMPLANT
PACK LOOP INSERTION (CUSTOM PROCEDURE TRAY) ×2 IMPLANT

## 2019-04-21 NOTE — Progress Notes (Signed)
Physical Therapy Treatment Patient Details Name: Christian Burns MRN: [id number] DOB: Jan 02, 1930 Today's Date: 04/21/2019    History of Present Illness 85 y.o. male with history of multiple myeloma status post radiation currently on chemotherapy, hypertension, history of radiation therapy, chronic kidney disease who presented to the ED secondary to difficulty speaking and right facial droop. TPA was administered. CT of the head revealed age indeterminate lacunar infarct involving the anterior limb of the right internal capsule and subinsular white matter.     PT Comments    Patient seen for mobility progression. Pt requires supervision/min guard for all mobility without use of AD. Pt with DGI score of 18/24. Current plan remains appropriate.    Follow Up Recommendations  Home health PT;Supervision/Assistance - 24 hour     Equipment Recommendations  None recommended by PT    Recommendations for Other Services       Precautions / Restrictions Precautions Precautions: Fall Restrictions Weight Bearing Restrictions: No    Mobility  Bed Mobility Overal bed mobility: Modified Independent Bed Mobility: Supine to Sit           General bed mobility comments: use of rail and HOB elevated  Transfers Overall transfer level: Needs assistance Equipment used: None Transfers: Sit to/from Stand Sit to Stand: Supervision         General transfer comment: supervision for safety  Ambulation/Gait Ambulation/Gait assistance: Min guard;Supervision Gait Distance (Feet): 300 Feet Assistive device: None Gait Pattern/deviations: Step-through pattern;Decreased stride length;Trunk flexed Gait velocity: decreased   General Gait Details: pt with guarded movements and reports he does feel like his gait is "normal"; no LOB or physical assist needed   Stairs             Wheelchair Mobility    Modified Rankin (Stroke Patients Only) Modified Rankin (Stroke Patients Only) Pre-Morbid  Rankin Score: Slight disability Modified Rankin: Moderate disability     Balance Overall balance assessment: Needs assistance Sitting-balance support: No upper extremity supported;Feet supported Sitting balance-Leahy Scale: Good     Standing balance support: No upper extremity supported;During functional activity Standing balance-Leahy Scale: Fair                   Standardized Balance Assessment Standardized Balance Assessment : Dynamic Gait Index   Dynamic Gait Index Level Surface: Normal Change in Gait Speed: Normal Gait with Horizontal Head Turns: Mild Impairment Gait with Vertical Head Turns: Mild Impairment Gait and Pivot Turn: Mild Impairment Step Over Obstacle: Mild Impairment Step Around Obstacles: Mild Impairment Steps: Mild Impairment Total Score: 18      Cognition Arousal/Alertness: Awake/alert Behavior During Therapy: WFL for tasks assessed/performed Overall Cognitive Status: Within Functional Limits for tasks assessed                                 General Comments: pt with occasional word finding difficulty; pt crying end of session when discussing situation and reports "I don't know why I'm so emotional"      Exercises      General Comments        Pertinent Vitals/Pain Pain Assessment: No/denies pain    Home Living                      Prior Function            PT Goals (current goals can now be found in the care plan section) Progress towards  PT goals: Progressing toward goals    Frequency    Min 4X/week      PT Plan Current plan remains appropriate    Co-evaluation              AM-PAC PT "6 Clicks" Mobility   Outcome Measure  Help needed turning from your back to your side while in a flat bed without using bedrails?: None Help needed moving from lying on your back to sitting on the side of a flat bed without using bedrails?: None Help needed moving to and from a bed to a chair (including a  wheelchair)?: A Little Help needed standing up from a chair using your arms (e.g., wheelchair or bedside chair)?: None Help needed to walk in hospital room?: A Little Help needed climbing 3-5 steps with a railing? : A Little 6 Click Score: 21    End of Session Equipment Utilized During Treatment: Gait belt Activity Tolerance: Patient tolerated treatment well Patient left: in chair;with call bell/phone within reach;with chair alarm set Nurse Communication: Mobility status PT Visit Diagnosis: Unsteadiness on feet (R26.81);Difficulty in walking, not elsewhere classified (R26.2)     Time: 8882-8003 PT Time Calculation (min) (ACUTE ONLY): 38 min  Charges:  $Gait Training: 23-37 mins                     Earney Navy, PTA Acute Rehabilitation Services Pager: [phone] Office: [phone]     Darliss Cheney 04/21/2019, 10:21 AM

## 2019-04-21 NOTE — Consult Note (Addendum)
ELECTROPHYSIOLOGY CONSULT NOTE  Patient ID: Christian Burns MRN: [id number], DOB/AGE: Mar 25, 1930   Admit date: 04/19/2019 Date of Consult: 04/21/2019  Primary Physician: Leanna Battles, MD Primary Cardiologist: No primary care provider on file.  Primary Electrophysiologist: New to None  Reason for Consultation: Cryptogenic stroke; recommendations regarding Implantable Loop Recorder  History of Present Illness EP has been asked to evaluate Christian Burns for placement of an implantable loop recorder to monitor for atrial fibrillation by Dr Erlinda Hong.  The patient was admitted on 04/19/2019 with difficulty speaking and R facial droop.  They first developed symptoms while at his assisted living facility.  Imaging demonstrated age indetermine lacunar infarct involving the anterior limb of the R internal capsule and subinsular white matter.  he has undergone workup for stroke including echocardiogram and carotid dopplers.  The patient has been monitored on telemetry which has demonstrated sinus rhythm with no arrhythmias.   Echocardiogram this admission demonstrated Normal EF55% and no source of thrombus. Lab work is reviewed.  Prior to admission, the patient denies chest pain, shortness of breath, dizziness, palpitations, or syncope.  They are recovering from their stroke with plans to go home at discharge.  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   Hypertension    Inguinal hernia    Multiple myeloma [facility]) Sept 2010   Multiple myeloma in relapse ([place]) 04/05/2016   Radiation 09/26/15-10/20/15   lower thoracic spine, upper lumbar spine 30 gray     Surgical History:  Past Surgical History:  Procedure Laterality Date   COLONOSCOPY     HIP PINNING,CANNULATED  Right 09/01/2018   Procedure: [facility] NAIL HIP PINNING;  Surgeon: Melrose Nakayama, MD;  Location: [place];  Service: Orthopedics;  Laterality: Right;   TEE WITHOUT CARDIOVERSION N/A 10/09/2018   Procedure: TRANSESOPHAGEAL ECHOCARDIOGRAM (TEE);  Surgeon: Thayer Headings, MD;  Location: [facility] ENDOSCOPY;  Service: Cardiovascular;  Laterality: N/A;   TONSILLECTOMY       Medications Prior to Admission  Medication Sig Dispense Refill Last Dose   amLODipine-benazepril (LOTREL) 5-10 MG capsule TAKE ONE CAPSULE EACH DAY (Patient taking differently: Take 1 capsule by mouth daily. ) 30 capsule 0 04/17/2019   dextromethorphan-guaiFENesin (MUCINEX DM) 30-600 MG 12hr tablet Take 1 tablet by mouth 2 (two) times daily. (Patient taking differently: Take 1 tablet by mouth 2 (two) times daily as needed for cough. ) 20 tablet 0 unk   finasteride (PROSCAR) 5 MG tablet Take 5 mg by mouth daily.   04/17/2019   LORazepam (ATIVAN) 0.5 MG tablet Take 1 tablet (0.5 mg total) by mouth every 6 (six) hours as needed (Nausea or vomiting). 30 tablet 0 unk   megestrol (MEGACE) 400 MG/10ML suspension Take 15 mLs (600 mg total) by mouth daily. 480 mL 5 04/17/2019   Multiple Vitamin (MULTIVITAMIN WITH MINERALS) TABS tablet Take 1 tablet by mouth daily.   04/17/2019   ondansetron (ZOFRAN) 8 MG tablet Take 1 tablet (8 mg total) by mouth 2 (two) times daily as needed for refractory nausea / vomiting. Start on day 2 after Bendamustine chemotherapy. 30 tablet 1 unk   penicillin v potassium (VEETID) 500 MG tablet Take 500 mg by mouth 4 (four) times daily. #120   04/17/2019   polyvinyl alcohol (LIQUIFILM TEARS) 1.4 %  ophthalmic solution Place 1 drop into both eyes as needed for dry eyes. 15 mL 0 unk   Probiotic Product (PROBIOTIC PO) Take 1 capsule by mouth daily.   04/17/2019   prochlorperazine (COMPAZINE) 10 MG tablet Take 1 tablet (10 mg total) by mouth every 6 (six) hours as needed (nausea and vomiting). 30 tablet 2 unk   pyridOXINE  (VITAMIN B-6) 100 MG tablet Take 100 mg by mouth daily.    04/17/2019   traMADol (ULTRAM) 50 MG tablet TAKE ONE TABLET EVERY EIGHT HOURS AS NEEDED (Patient taking differently: Take 50 mg by mouth every 8 (eight) hours as needed for moderate pain. ) 60 tablet 0 unk   vitamin B-12 (CYANOCOBALAMIN) 500 MCG tablet Take 500 mcg by mouth daily.   04/17/2019   Vitamin D, Ergocalciferol, (DRISDOL) 50000 UNITS CAPS capsule Take 50,000 Units by mouth every Sunday.    04/11/2019   azithromycin (ZITHROMAX) 500 MG tablet    Not Taking at Unknown time   lidocaine-prilocaine (EMLA) cream Apply to affected area once 30 g 3     Inpatient Medications:   aspirin EC  81 mg Oral Daily   Chlorhexidine Gluconate Cloth  6 each Topical Daily   clopidogrel  75 mg Oral Daily   finasteride  5 mg Oral Daily   pantoprazole  40 mg Oral QHS   pyridOXINE  100 mg Oral Daily   vitamin B-12  500 mcg Oral Daily   [START ON 04/25/2019] Vitamin D (Ergocalciferol)  50,000 Units Oral Q Sun    Allergies:  Allergies  Allergen Reactions   Sulfa Antibiotics Itching   Sulfasalazine Itching    Social History   Socioeconomic History   Marital status: Married    Spouse name: Romie Minus   Number of children: Not on file   Years of education: Not on file   Highest education level: Not on file  Occupational History   Occupation: retired  Scientist, product/process development strain: Not hard at [employer] insecurity    Worry: Never true    Inability: Never true   Transportation needs    Medical: No    Non-medical: No  Tobacco Use   Smoking status: Never Smoker   Smokeless tobacco: Never Used   Tobacco comment: never used tobacco  Substance and Sexual Activity   Alcohol use: Yes    Alcohol/week: 4.0 standard drinks    Types: 4 Glasses of wine per week   Drug use: No   Sexual activity: Not on file  Lifestyle   Physical activity    Days per week: Not on file    Minutes per session: Not on file    Stress: Not on file  Relationships   Social connections    Talks on phone: More than three times a week    Gets together: More than three times a week    Attends religious service: Not on file    Active member of club or organization: Not on file    Attends meetings of clubs or organizations: Not on file    Relationship status: Married   Intimate partner violence    Fear of current or ex partner: Not on file    Emotionally abused: Not on file    Physically abused: Not on file    Forced sexual activity: Not on file  Other Topics Concern   Not on file  Social History Narrative   Mr Termini is retired and lives at [employer] at  [address] retirement apartment with his wife, Romie Minus. They have assist from their son, [name] and grandson, [name]. He is independent/assist with his care needs His son and/or grandson assists with transportation to medical appointments     Family History  Problem Relation Age of Onset   Hypertension Mother    Hypertension Father       Review of Systems: All other systems reviewed and are otherwise negative except as noted above.  Physical Exam: Vitals:   04/20/19 1947 04/20/19 2339 04/21/19 0353 04/21/19 0846  BP: 135/75 133/68 (!) 141/64 126/78  Pulse: 91 77 79 82  Resp: 18 18 20 20   Temp: 98.1 F (36.7 C) 98 F (36.7 C) 98.3 F (36.8 C) 98.1 F (36.7 C)  TempSrc: Oral Oral Oral Oral  SpO2: 100% 100% 100% 100%  Weight:      Height:        GEN- The patient is well appearing, alert and oriented x 3 today.   Head- normocephalic, atraumatic Eyes-  Sclera clear, conjunctiva pink Ears- hearing intact Oropharynx- clear Neck- supple Lungs- Clear to ausculation bilaterally, normal work of breathing Heart- Regular rate and rhythm, no murmurs, rubs or gallops  GI- soft, NT, ND, + BS Extremities- no clubbing, cyanosis, or edema MS- no significant deformity or atrophy Skin- no rash or lesion Psych- euthymic mood, full affect   Labs:   Lab  Results  Component Value Date   WBC 3.8 (L) 04/21/2019   HGB 8.1 (L) 04/21/2019   HCT 24.5 (L) 04/21/2019   MCV 92.1 04/21/2019   PLT 160 04/21/2019    Recent Labs  Lab 04/19/19 1528  04/21/19 0337  NA 137   < > 136  K 6.1*   < > 4.9  CL 117*   < > 119*  CO2 12*   < > 11*  BUN 59*   < > 45*  CREATININE 2.28*   < > 1.75*  CALCIUM 8.3*   < > 7.8*  PROT 7.0  --   --   BILITOT 1.1  --   --   ALKPHOS 60  --   --   ALT 17  --   --   AST 38  --   --   GLUCOSE 95   < > 94   < > = values in this interval not displayed.     Radiology/Studies: Mr Angio Head Wo Contrast  Result Date: 04/20/2019 CLINICAL DATA:  Stroke, follow-up. Abnormal speech and right-sided facial droop. The patient received tPA. EXAM: MRI HEAD WITHOUT CONTRAST MRA HEAD WITHOUT CONTRAST TECHNIQUE: Multiplanar, multiecho pulse sequences of the brain and surrounding structures were obtained without intravenous contrast. Angiographic images of the head were obtained using MRA technique without contrast. COMPARISON:  CT head without contrast 04/19/2019. FINDINGS: MRI HEAD FINDINGS Brain: Diffusion-weighted images demonstrate a focus of subcortical white matter acute nonhemorrhagic infarcts in the posterior left frontal lobe, subjacent to the precentral gyrus. No other acute cortical or white matter infarct is present. The area in the anterior limb of the right internal capsule is consistent with remote ischemia. Dilated perivascular spaces are present within the basal ganglia. Mild atrophy is present. Minimal white matter changes are otherwise within normal limits for age. A small lipoma is evident along the left tentorium. No other focal lesions are present. No significant extraaxial fluid collection is present. The ventricles are proportionate to the degree of atrophy. Vascular: Flow is present in the major intracranial arteries. The right vertebral artery  is hypoplastic. Skull and upper cervical spine: The craniocervical junction  is normal. Degenerative changes are present in the upper cervical spine. There is slight retrolisthesis at C2-3. Marrow signal is unremarkable. Sinuses/Orbits: The paranasal sinuses and mastoid air cells are clear. Bilateral lens replacements are noted. Globes and orbits are otherwise unremarkable. MRA HEAD FINDINGS The internal carotid arteries are within normal limits from high cervical segments through the ICA termini. The right A1 segment is aplastic. Both A2 segments fill from the right. M1 segments are normal bilaterally. The MCA bifurcations are normal. ACA and MCA branch vessels are normal. The left vertebral artery is the dominant vessel. The right vertebral artery terminates at the PICA. Left PICA origin is visualized and normal. Basilar artery is normal. Both posterior cerebral arteries originate from basilar tip. PCA branch vessels are within normal limits. IMPRESSION: 1. No acute intracranial abnormality. 2. Focal lesion in the anterior limb of the right internal capsule is consistent with remote ischemia. 3. Otherwise normal MRI of the brain for age. 4. Normal variant MRA circle-of-Willis without significant proximal stenosis, aneurysm, or branch vessel occlusion. Electronically Signed   By: San Morelle M.D.   On: 04/20/2019 16:37   Mr Brain Wo Contrast  Result Date: 04/20/2019 CLINICAL DATA:  Stroke, follow-up. Abnormal speech and right-sided facial droop. The patient received tPA. EXAM: MRI HEAD WITHOUT CONTRAST MRA HEAD WITHOUT CONTRAST TECHNIQUE: Multiplanar, multiecho pulse sequences of the brain and surrounding structures were obtained without intravenous contrast. Angiographic images of the head were obtained using MRA technique without contrast. COMPARISON:  CT head without contrast 04/19/2019. FINDINGS: MRI HEAD FINDINGS Brain: Diffusion-weighted images demonstrate a focus of subcortical white matter acute nonhemorrhagic infarcts in the posterior left frontal lobe, subjacent to the  precentral gyrus. No other acute cortical or white matter infarct is present. The area in the anterior limb of the right internal capsule is consistent with remote ischemia. Dilated perivascular spaces are present within the basal ganglia. Mild atrophy is present. Minimal white matter changes are otherwise within normal limits for age. A small lipoma is evident along the left tentorium. No other focal lesions are present. No significant extraaxial fluid collection is present. The ventricles are proportionate to the degree of atrophy. Vascular: Flow is present in the major intracranial arteries. The right vertebral artery is hypoplastic. Skull and upper cervical spine: The craniocervical junction is normal. Degenerative changes are present in the upper cervical spine. There is slight retrolisthesis at C2-3. Marrow signal is unremarkable. Sinuses/Orbits: The paranasal sinuses and mastoid air cells are clear. Bilateral lens replacements are noted. Globes and orbits are otherwise unremarkable. MRA HEAD FINDINGS The internal carotid arteries are within normal limits from high cervical segments through the ICA termini. The right A1 segment is aplastic. Both A2 segments fill from the right. M1 segments are normal bilaterally. The MCA bifurcations are normal. ACA and MCA branch vessels are normal. The left vertebral artery is the dominant vessel. The right vertebral artery terminates at the PICA. Left PICA origin is visualized and normal. Basilar artery is normal. Both posterior cerebral arteries originate from basilar tip. PCA branch vessels are within normal limits. IMPRESSION: 1. No acute intracranial abnormality. 2. Focal lesion in the anterior limb of the right internal capsule is consistent with remote ischemia. 3. Otherwise normal MRI of the brain for age. 4. Normal variant MRA circle-of-Willis without significant proximal stenosis, aneurysm, or branch vessel occlusion. Electronically Signed   By: San Morelle M.D.   On: 04/20/2019 16:37  Dg Chest Port 1 View  Result Date: 04/19/2019 CLINICAL DATA:  Aspiration into airway EXAM: PORTABLE CHEST 1 VIEW COMPARISON:  Chest radiographs 09/01/2018, 10/06/2018 FINDINGS: The the cardiomediastinal contours are within normal limits. Chronic bilateral coarse interstitial markings. No definite new focal infiltrate. No pneumothorax or large pleural effusion. No acute finding in the visualized skeleton. IMPRESSION: No new focal infiltrate identified. Electronically Signed   By: Audie Pinto M.D.   On: 04/19/2019 17:30   Ct Head Code Stroke Wo Contrast  Result Date: 04/19/2019 CLINICAL DATA:  Code stroke. Code stroke. Slurred speech. Left-sided weakness. EXAM: CT HEAD WITHOUT CONTRAST TECHNIQUE: Contiguous axial images were obtained from the base of the skull through the vertex without intravenous contrast. COMPARISON:  MRI face 05/12/2017 FINDINGS: Brain: In age indeterminate lacunar infarct is present in the anterior limb of the right internal capsule adjacent to the right caudate head. Asymmetric white matter attenuation is present in the subinsular white matter on the right as well. Other scattered subcortical white matter changes are present bilaterally. Basal ganglia are otherwise intact. Cortex is unremarkable. Insert brainstem Vascular: Atherosclerotic calcifications are present within the cavernous internal carotid arteries bilaterally. There is no asymmetric hyperdense vessel. Skull: Calvarium is intact. No focal lytic or blastic lesions are present. Sinuses/Orbits: The paranasal sinuses and mastoid air cells are clear. Bilateral lens replacements are noted. Globes and orbits are otherwise unremarkable. ASPECTS [facility] Stroke Program Early CT Score) - Ganglionic level infarction (caudate, lentiform nuclei, internal capsule, insula, M1-M3 cortex): 6/7 - Supraganglionic infarction (M4-M6 cortex): 3/3 Total score (0-10 with 10 being normal): 9/10 IMPRESSION:  1. Age indeterminate lacunar infarct involving the anterior limb of the right internal capsule and subinsular white matter. 2. Mild generalized white matter likely reflects the sequela of chronic microvascular ischemia. 3. ASPECTS is 9/10 Electronically Signed   By: San Morelle M.D.   On: 04/19/2019 15:52   Carotid (at [place] Only)  Result Date: 04/21/2019 Carotid Arterial Duplex Study Indications:       CVA. Risk Factors:      Hypertension. Comparison Study:  no prior Performing Technologist: June Leap RDMS, RVT  Examination Guidelines: A complete evaluation includes B-mode imaging, spectral Doppler, color Doppler, and power Doppler as needed of all accessible portions of each vessel. Bilateral testing is considered an integral part of a complete examination. Limited examinations for reoccurring indications may be performed as noted.  Right Carotid Findings: +----------+--------+--------+--------+------------------+--------+             PSV cm/s EDV cm/s Stenosis Plaque Description Comments  +----------+--------+--------+--------+------------------+--------+  CCA Prox   81       16                                             +----------+--------+--------+--------+------------------+--------+  CCA Distal 86       13                                             +----------+--------+--------+--------+------------------+--------+  ICA Prox   67       12       1-39%    homogeneous                  +----------+--------+--------+--------+------------------+--------+  ICA Distal 85  19                                             +----------+--------+--------+--------+------------------+--------+  ECA        115      4                                              +----------+--------+--------+--------+------------------+--------+ +----------+--------+-------+----------------+-------------------+             PSV cm/s EDV cms Describe         Arm Pressure (mmHG)   +----------+--------+-------+----------------+-------------------+  Subclavian 170              Multiphasic, WNL                      +----------+--------+-------+----------------+-------------------+ +---------+--------+--+--------+-+---------+  Vertebral PSV cm/s 49 EDV cm/s 2 Antegrade  +---------+--------+--+--------+-+---------+  Left Carotid Findings: +----------+--------+--------+--------+------------------+--------+             PSV cm/s EDV cm/s Stenosis Plaque Description Comments  +----------+--------+--------+--------+------------------+--------+  CCA Prox   122      20                                             +----------+--------+--------+--------+------------------+--------+  CCA Distal 106      9                                              +----------+--------+--------+--------+------------------+--------+  ICA Prox   124      20       1-39%    homogeneous                  +----------+--------+--------+--------+------------------+--------+  ICA Distal 85       17                                             +----------+--------+--------+--------+------------------+--------+  ECA        123      9                                              +----------+--------+--------+--------+------------------+--------+ +----------+--------+--------+----------------+-------------------+             PSV cm/s EDV cm/s Describe         Arm Pressure (mmHG)  +----------+--------+--------+----------------+-------------------+  Subclavian 151               Multiphasic, WNL                      +----------+--------+--------+----------------+-------------------+ +---------+--------+--+--------+--+---------+  Vertebral PSV cm/s 77 EDV cm/s 11 Antegrade  +---------+--------+--+--------+--+---------+  Summary: Right Carotid: Velocities in the right ICA are consistent with a 1-39% stenosis. Left Carotid: Velocities in the left ICA are consistent with  a 1-39% stenosis. Vertebrals:  Bilateral vertebral arteries demonstrate  antegrade flow. Subclavians: Normal flow hemodynamics were seen in bilateral subclavian              arteries. *See table(s) above for measurements and observations.  Electronically signed by Antony Contras MD on 04/21/2019 at 8:14:42 AM.    Final     12-lead ECG 04/19/2019 NSR at 90 bpm (personally reviewed) All prior EKG's in EPIC reviewed with no documented atrial fibrillation  Telemetry NSR 80-90s, with occasional sinus tachycardia. (personally reviewed)  Assessment and Plan:  1. Cryptogenic stroke The patient presents with cryptogenic stroke.  The patient does not have a TEE planned. I spoke at length with the patient about monitoring for afib with an implantable loop recorder.  Risks, benefits, and alteratives to implantable loop recorder were discussed with the patient today.   At this time, the patient is very clear in their decision to proceed with implantable loop recorder.   Wound care was reviewed with the patient (keep incision clean and dry for 3 days).  Wound check scheduled and entered in AVS.  Please call with questions.  Shirley Friar, PA-C 04/21/2019 9:43 AM   EP Attending  Patient seen and examined. Agree with the findings as noted above. The patient has been admitted with a cryptogenic stroke. His neuro status has improved markedly since presentation where he had an expressive aphasia. I have reviewed the indications/risks/benefits/goals/expectations of ILR insertion and he wishes to proceed.  Mikle Bosworth.D.

## 2019-04-21 NOTE — TOC Transition Note (Signed)
Transition of Care [facility]) - CM/SW Discharge Note   Patient Details  Name: Christian Burns MRN: [id number] Date of Birth: [birth year]  [facility] CM/SW Contact:  Pollie Friar, RN Phone Number: 04/21/2019, 3:57 PM   Clinical Narrative:    Pt discharging back to [facility]. Pt with orders for [facility] services. [facility] will provide these services. CM called [facility] and spoke to the nursing department and they will arrange for the therapies.  CM reached out to patients son and either he or the grandson will provide transport home.   Final next level of care: Home w Home Health Services Barriers to Discharge: No Barriers Identified   Patient Goals and CMS Choice   CMS Medicare.gov Compare Post Acute Care list provided to:: Patient Choice offered to / list presented to : Patient  Discharge Placement                       Discharge Plan and Services                                     Social Determinants of Health (SDOH) Interventions     Readmission Risk Interventions No flowsheet data found.

## 2019-04-21 NOTE — Progress Notes (Signed)
Assisted patient to bathroom to void.

## 2019-04-21 NOTE — Discharge Instructions (Signed)

## 2019-04-21 NOTE — Progress Notes (Signed)
Occupational Therapy Treatment Patient Details Name: Christian Burns MRN: [id number] DOB: 1930-06-03 Today's Date: 04/21/2019    History of present illness 85 y.o. male with history of multiple myeloma status post radiation currently on chemotherapy, hypertension, history of radiation therapy, chronic kidney disease who presented to the ED secondary to difficulty speaking and right facial droop. TPA was administered. CT of the head revealed age indeterminate lacunar infarct involving the anterior limb of the right internal capsule and subinsular white matter.    OT comments  Pt continues to make progress towards OT goals. Pt continues to present with decreased balance and safety, however feel he is close to baseline. Pt performed bed mobility, grooming tasks, and simulated toilet transfer with min guard A for safety. Pt completed tub transfer with min A for safety and balance. Continue to recommend dc home with HHOT for balance, safety, and fall prevention. Will continue to follow acutely as admitted.    Follow Up Recommendations  Home health OT;Supervision/Assistance - 24 hour    Equipment Recommendations  None recommended by OT    Recommendations for Other Services PT consult    Precautions / Restrictions Precautions Precautions: Fall Restrictions Weight Bearing Restrictions: No       Mobility Bed Mobility Overal bed mobility: Modified Independent Bed Mobility: Supine to Sit     Supine to sit: Modified independent (Device/Increase time)     General bed mobility comments: use of rail and HOB elevated  Transfers Overall transfer level: Modified independent Equipment used: None Transfers: Sit to/from Stand Sit to Stand: Min guard         General transfer comment: Min guard A for safety.     Balance Overall balance assessment: Modified Independent Sitting-balance support: No upper extremity supported;Feet supported Sitting balance-Leahy Scale: Good     Standing  balance support: No upper extremity supported;During functional activity Standing balance-Leahy Scale: Fair Standing balance comment: Pt able to maintain standing balance at sink to complete grooming tasks with min guard A for safety                           ADL either performed or assessed with clinical judgement   ADL Overall ADL's : Needs assistance/impaired     Grooming: Wash/dry hands;Applying deodorant;Brushing hair;Min guard;Standing Grooming Details (indicate cue type and reason): Min Guard A for safety in standing.                 Toilet Transfer: Min guard;Ambulation(simulated to recliner) Toilet Transfer Details (indicate cue type and reason): Pt requiring min guard A for safe descent     Tub/ Shower Transfer: Minimal assistance;Ambulation;Shower Scientist, research (medical) Details (indicate cue type and reason): Pt able to demonstrate transfer into and out of tub shower with min A and use of hands on walls to stabilize. Functional mobility during ADLs: Min guard General ADL Comments: Pt presenting with decreased balance during ADLs requiring min guard A.     Vision       Perception     Praxis      Cognition Arousal/Alertness: Awake/alert Behavior During Therapy: WFL for tasks assessed/performed Overall Cognitive Status: Within Functional Limits for tasks assessed                                 General Comments: Pt was able to locate room when returning from gym  Exercises     Shoulder Instructions       General Comments      Pertinent Vitals/ Pain       Pain Assessment: No/denies pain  Home Living                                          Prior Functioning/Environment              Frequency  Min 2X/week        Progress Toward Goals  OT Goals(current goals can now be found in the care plan section)  Progress towards OT goals: Progressing toward goals  Acute Rehab OT Goals Patient  Stated Goal: "Go home tomorrow, I hope." OT Goal Formulation: With patient Time For Goal Achievement: 05/04/19 Potential to Achieve Goals: Good ADL Goals Pt Will Perform Grooming: with modified independence;standing Pt Will Perform Lower Body Dressing: with modified independence;sit to/from stand Pt Will Transfer to Toilet: with modified independence;ambulating;regular height toilet Pt Will Perform Toileting - Clothing Manipulation and hygiene: with modified independence;sit to/from stand;sitting/lateral leans Additional ADL Goal #1: Pt will demonstrate increased standing tolerance/balance to complete simple IADL in standing with supervision  Plan Discharge plan remains appropriate    Co-evaluation                 AM-PAC OT "6 Clicks" Daily Activity     Outcome Measure   Help from another person eating meals?: None Help from another person taking care of personal grooming?: A Little Help from another person toileting, which includes using toliet, bedpan, or urinal?: A Little Help from another person bathing (including washing, rinsing, drying)?: A Little Help from another person to put on and taking off regular upper body clothing?: A Little Help from another person to put on and taking off regular lower body clothing?: A Little 6 Click Score: 19    End of Session Equipment Utilized During Treatment: Gait belt  OT Visit Diagnosis: Unsteadiness on feet (R26.81);Other abnormalities of gait and mobility (R26.89);Muscle weakness (generalized) (M62.81);Other symptoms and signs involving cognitive function   Activity Tolerance Patient tolerated treatment well   Patient Left in chair;with call bell/phone within reach;with chair alarm set   Nurse Communication Mobility status(Pt request to check on possibility to dc today)        Time: 4158-3094 OT Time Calculation (min): 24 min  Charges: OT General Charges $OT Visit: 1 Visit OT Treatments $Self Care/Home Management :  23-37 mins  Gus Rankin, OT Student  Gus Rankin 04/21/2019, 5:02 PM

## 2019-04-21 NOTE — Plan of Care (Signed)
Min assist adls

## 2019-04-22 ENCOUNTER — Telehealth: Payer: Self-pay | Admitting: *Deleted

## 2019-04-22 ENCOUNTER — Encounter (HOSPITAL_COMMUNITY): Payer: Self-pay | Admitting: Internal Medicine

## 2019-04-22 ENCOUNTER — Other Ambulatory Visit: Payer: Self-pay | Admitting: Hematology & Oncology

## 2019-04-22 NOTE — Telephone Encounter (Signed)
Message received from patient's grandson to inform Dr. Marin Olp that pt was discharged yesterday from being hospitalized for a CVA and on his discharge papers was told to stop Amlodipine, Proscar and Megace and would like to know if Dr. Marin Olp would like him to continue these medicines or not.  Call placed back to [facility] and notified him per order of Dr. Niel Hummer for patient to continue Amlodipine and Proscar and to stop Megace.  Christian Burns appreciative of call back and has no further questions or concerns at this time.

## 2019-04-27 ENCOUNTER — Encounter: Payer: Self-pay | Admitting: Internal Medicine

## 2019-04-27 ENCOUNTER — Ambulatory Visit: Payer: Medicare HMO | Admitting: Internal Medicine

## 2019-04-27 ENCOUNTER — Other Ambulatory Visit: Payer: Self-pay

## 2019-04-27 VITALS — BP 142/79 | HR 84 | Temp 98.3°F

## 2019-04-27 DIAGNOSIS — C9002 Multiple myeloma in relapse: Secondary | ICD-10-CM

## 2019-04-27 DIAGNOSIS — M272 Inflammatory conditions of jaws: Secondary | ICD-10-CM | POA: Diagnosis not present

## 2019-04-27 DIAGNOSIS — Z23 Encounter for immunization: Secondary | ICD-10-CM

## 2019-04-27 MED ORDER — PENICILLIN V POTASSIUM 500 MG PO TABS: 500.0000 mg | ORAL_TABLET | Freq: Four times a day (QID) | ORAL | 11 refills | Status: DC

## 2019-04-27 NOTE — Progress Notes (Signed)
RFV: follow up on jaw osteo  Patient ID: Christian Burns, male   DOB: 11-20-1929, 85 y.o.   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 Tolerates abtx wihtout difficulty. He has had eventful summer, hospitalized for non-covid pneumonia then also recently hospitalized for stroke. Now back to baseline  Outpatient Encounter Medications as of 04/27/2019  Medication Sig  . amLODipine-benazepril (LOTREL) 5-10 MG capsule TAKE ONE CAPSULE EACH DAY (Patient taking differently: Take 1 capsule by mouth daily. )  . aspirin EC 81 MG EC tablet Take 1 tablet (81 mg total) by mouth daily.  . clopidogrel (PLAVIX) 75 MG tablet Take 1 tablet (75 mg total) by mouth daily for 21 days. Take with aspirin 81 x 21 days then stop plavix and continue aspirin 81 daily  . dronabinol (MARINOL) 5 MG capsule TAKE ONE CAPSULE TWICE DAILY BEFORE MEALS  . finasteride (PROSCAR) 5 MG tablet TAKE ONE TABLET EACH DAY  . LORazepam (ATIVAN) 0.5 MG tablet Take 1 tablet (0.5 mg total) by mouth every 6 (six) hours as needed (Nausea or vomiting).  . Multiple Vitamin (MULTIVITAMIN WITH MINERALS) TABS tablet Take 1 tablet by mouth daily.  . ondansetron (ZOFRAN) 8 MG tablet Take 1 tablet (8 mg total) by mouth 2 (two) times daily as needed for refractory nausea / vomiting. Start on day 2 after Bendamustine chemotherapy.  . penicillin v potassium (VEETID) 500 MG tablet Take 500 mg by mouth 4 (four) times daily. #120  . polyvinyl alcohol (LIQUIFILM TEARS) 1.4 % ophthalmic solution Place 1 drop into both eyes as needed for dry eyes.  . Probiotic Product (PROBIOTIC PO) Take 1 capsule by mouth daily.  . prochlorperazine (COMPAZINE) 10 MG tablet Take 1 tablet (10 mg total) by mouth every 6 (six) hours as needed (nausea and vomiting).  . pyridOXINE (VITAMIN B-6) 100 MG tablet Take 100 mg by mouth daily.   . traMADol (ULTRAM) 50 MG tablet Take 1 tablet (50 mg total) by mouth  every 8 (eight) hours as needed for moderate pain.  . vitamin B-12 (CYANOCOBALAMIN) 500 MCG tablet Take 500 mcg by mouth daily.  . Vitamin D, Ergocalciferol, (DRISDOL) 50000 UNITS CAPS capsule Take 50,000 Units by mouth every Sunday.   Marland Kitchen dextromethorphan-guaiFENesin (MUCINEX DM) 30-600 MG 12hr tablet Take 1 tablet by mouth 2 (two) times daily as needed for cough. (Patient not taking: Reported on 04/27/2019)  . lidocaine-prilocaine (EMLA) cream Apply to affected area once (Patient not taking: Reported on 04/27/2019)  . megestrol (MEGACE) 400 MG/10ML suspension Take 15 mLs (600 mg total) by mouth daily. (Patient not taking: Reported on 04/27/2019)  . [DISCONTINUED] azithromycin (ZITHROMAX) 500 MG tablet    No facility-administered encounter medications on file as of 04/27/2019.      Patient Active Problem List   Diagnosis Date Noted  . Hyperlipidemia LDL goal <70 04/20/2019  . Essential hypertension 04/20/2019  . Advanced age 85/03/2019  . Stroke ([place]) 04/19/2019  . Community acquired pneumonia 03/03/2019  . AKI (acute kidney injury) ([place]) 03/03/2019  . Normocytic anemia 03/03/2019  . Goals of care, counseling/discussion 02/04/2019  . Acute osteomyelitis of jaw 11/26/2018  . Bacteremia   . 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 Sepsis due to pneumonia ([place])   . Left lower lobe pneumonia ([place]) 10/06/2018  . Right hip pain 09/01/2018  . Iron deficiency anemia secondary to inadequate dietary iron intake 07/18/2016  . Multiple myeloma in relapse (  [place]) 04/05/2016  . 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 Multiple myeloma in remission ([place]) 09/08/2015  . Myeloma ([place]) 08/23/2011     Health Maintenance Due  Topic Date Due  . TETANUS/TDAP  05/01/1951  . PNA vac Low Risk Adult (1 of 2 - PCV13) 04/30/1997    Social History   Tobacco Use  . Smoking status: Never Smoker  . Smokeless tobacco: Never Used  . Tobacco comment: never used tobacco  Substance Use Topics   . Alcohol use: Yes    Alcohol/week: 4.0 standard drinks    Types: 4 Glasses of wine per week  . Drug use: No   Review of Systems Other than left sided jaw pain. Hard of hearing12 point ros is negative  Physical Exam   BP (!) 142/79   Pulse 84   Temp 98.3 F (36.8 C) (Oral)   SpO2 100%   Physical Exam  Constitutional: He is oriented to person, place, and time. He appears well-developed and well-nourished. No distress.  HENT:  Mouth/Throat: Oropharynx is clear and moist. No oropharyngeal exudate. No open sores. No lumpiness to the jaw    CBC Lab Results  Component Value Date   WBC 3.8 (L) 04/21/2019   RBC 2.66 (L) 04/21/2019   HGB 8.1 (L) 04/21/2019   HCT 24.5 (L) 04/21/2019   PLT 160 04/21/2019   MCV 92.1 04/21/2019   MCH 30.5 04/21/2019   MCHC 33.1 04/21/2019   RDW 16.0 (H) 04/21/2019   LYMPHSABS 0.1 (L) 04/19/2019   MONOABS 1.2 (H) 04/19/2019   EOSABS 0.4 04/19/2019    BMET Lab Results  Component Value Date   NA 136 04/21/2019   K 4.9 04/21/2019   CL 119 (H) 04/21/2019   CO2 11 (L) 04/21/2019   GLUCOSE 94 04/21/2019   BUN 45 (H) 04/21/2019   CREATININE 1.75 (H) 04/21/2019   CALCIUM 7.8 (L) 04/21/2019   GFRNONAA 34 (L) 04/21/2019   GFRAA 40 (L) 04/21/2019      Assessment and Plan  Jaw osteo = refill penicillin. Continue as he tolerates.  Multiple myeloma = followed by dr Marin Olp  Health maintenance = will give flu shot  Plan to see back in 4 months

## 2019-04-28 DIAGNOSIS — I69398 Other sequelae of cerebral infarction: Secondary | ICD-10-CM | POA: Diagnosis not present

## 2019-04-28 DIAGNOSIS — M6281 Muscle weakness (generalized): Secondary | ICD-10-CM | POA: Diagnosis not present

## 2019-04-28 DIAGNOSIS — R2681 Unsteadiness on feet: Secondary | ICD-10-CM | POA: Diagnosis not present

## 2019-04-28 DIAGNOSIS — R262 Difficulty in walking, not elsewhere classified: Secondary | ICD-10-CM | POA: Diagnosis not present

## 2019-05-04 ENCOUNTER — Other Ambulatory Visit: Payer: Self-pay

## 2019-05-04 ENCOUNTER — Ambulatory Visit (INDEPENDENT_AMBULATORY_CARE_PROVIDER_SITE_OTHER): Payer: Medicare HMO | Admitting: Student

## 2019-05-04 DIAGNOSIS — I639 Cerebral infarction, unspecified: Secondary | ICD-10-CM

## 2019-05-04 LAB — CUP PACEART INCLINIC DEVICE CHECK
Date Time Interrogation Session: 20200922165251
Implantable Pulse Generator Implant Date: 20200909

## 2019-05-04 NOTE — Progress Notes (Signed)
ILR wound check in clinic. Steri strips removed. Wound well healed. Home monitor transmitting nightly. No episodes. Questions answered. R waves 0.71 mV  Beryle Beams" [place], [place] 05/04/2019 4:52 PM

## 2019-05-05 DIAGNOSIS — I69398 Other sequelae of cerebral infarction: Secondary | ICD-10-CM | POA: Diagnosis not present

## 2019-05-05 DIAGNOSIS — M6281 Muscle weakness (generalized): Secondary | ICD-10-CM | POA: Diagnosis not present

## 2019-05-05 DIAGNOSIS — R2681 Unsteadiness on feet: Secondary | ICD-10-CM | POA: Diagnosis not present

## 2019-05-05 DIAGNOSIS — R262 Difficulty in walking, not elsewhere classified: Secondary | ICD-10-CM | POA: Diagnosis not present

## 2019-05-07 ENCOUNTER — Telehealth: Payer: Self-pay | Admitting: *Deleted

## 2019-05-07 DIAGNOSIS — I69398 Other sequelae of cerebral infarction: Secondary | ICD-10-CM | POA: Diagnosis not present

## 2019-05-07 DIAGNOSIS — R262 Difficulty in walking, not elsewhere classified: Secondary | ICD-10-CM | POA: Diagnosis not present

## 2019-05-07 DIAGNOSIS — R2681 Unsteadiness on feet: Secondary | ICD-10-CM | POA: Diagnosis not present

## 2019-05-07 DIAGNOSIS — M6281 Muscle weakness (generalized): Secondary | ICD-10-CM | POA: Diagnosis not present

## 2019-05-07 NOTE — Telephone Encounter (Signed)
Received phone call from patient worried about a letter he received from the [employer] regarding his coverage and coverage being cancelled in [place].  Call transferred to Otilio Carpen, Development worker, community.

## 2019-05-10 DIAGNOSIS — I69398 Other sequelae of cerebral infarction: Secondary | ICD-10-CM | POA: Diagnosis not present

## 2019-05-10 DIAGNOSIS — M6281 Muscle weakness (generalized): Secondary | ICD-10-CM | POA: Diagnosis not present

## 2019-05-10 DIAGNOSIS — R2681 Unsteadiness on feet: Secondary | ICD-10-CM | POA: Diagnosis not present

## 2019-05-10 DIAGNOSIS — R262 Difficulty in walking, not elsewhere classified: Secondary | ICD-10-CM | POA: Diagnosis not present

## 2019-05-12 DIAGNOSIS — R2681 Unsteadiness on feet: Secondary | ICD-10-CM | POA: Diagnosis not present

## 2019-05-12 DIAGNOSIS — R262 Difficulty in walking, not elsewhere classified: Secondary | ICD-10-CM | POA: Diagnosis not present

## 2019-05-12 DIAGNOSIS — I69398 Other sequelae of cerebral infarction: Secondary | ICD-10-CM | POA: Diagnosis not present

## 2019-05-12 DIAGNOSIS — M6281 Muscle weakness (generalized): Secondary | ICD-10-CM | POA: Diagnosis not present

## 2019-05-13 ENCOUNTER — Other Ambulatory Visit: Payer: Self-pay | Admitting: *Deleted

## 2019-05-13 ENCOUNTER — Other Ambulatory Visit: Payer: Self-pay

## 2019-05-13 ENCOUNTER — Telehealth: Payer: Self-pay | Admitting: *Deleted

## 2019-05-13 ENCOUNTER — Other Ambulatory Visit: Payer: Self-pay | Admitting: Family

## 2019-05-13 ENCOUNTER — Inpatient Hospital Stay: Payer: Medicare HMO | Attending: Hematology & Oncology | Admitting: Hematology & Oncology

## 2019-05-13 ENCOUNTER — Encounter: Payer: Self-pay | Admitting: Hematology & Oncology

## 2019-05-13 ENCOUNTER — Inpatient Hospital Stay: Payer: Medicare HMO

## 2019-05-13 VITALS — BP 137/72 | HR 82 | Temp 97.5°F | Resp 19 | Wt 146.0 lb

## 2019-05-13 DIAGNOSIS — R5383 Other fatigue: Secondary | ICD-10-CM | POA: Diagnosis not present

## 2019-05-13 DIAGNOSIS — D6481 Anemia due to antineoplastic chemotherapy: Secondary | ICD-10-CM | POA: Insufficient documentation

## 2019-05-13 DIAGNOSIS — G629 Polyneuropathy, unspecified: Secondary | ICD-10-CM | POA: Diagnosis not present

## 2019-05-13 DIAGNOSIS — C9002 Multiple myeloma in relapse: Secondary | ICD-10-CM | POA: Insufficient documentation

## 2019-05-13 DIAGNOSIS — Z7902 Long term (current) use of antithrombotics/antiplatelets: Secondary | ICD-10-CM | POA: Insufficient documentation

## 2019-05-13 DIAGNOSIS — Z5111 Encounter for antineoplastic chemotherapy: Secondary | ICD-10-CM | POA: Insufficient documentation

## 2019-05-13 DIAGNOSIS — Z79899 Other long term (current) drug therapy: Secondary | ICD-10-CM | POA: Insufficient documentation

## 2019-05-13 DIAGNOSIS — D649 Anemia, unspecified: Secondary | ICD-10-CM

## 2019-05-13 DIAGNOSIS — C9000 Multiple myeloma not having achieved remission: Secondary | ICD-10-CM

## 2019-05-13 DIAGNOSIS — N289 Disorder of kidney and ureter, unspecified: Secondary | ICD-10-CM | POA: Insufficient documentation

## 2019-05-13 DIAGNOSIS — Z882 Allergy status to sulfonamides status: Secondary | ICD-10-CM | POA: Insufficient documentation

## 2019-05-13 LAB — CMP (CANCER CENTER ONLY)
ALT: 13 U/L (ref 0–44)
AST: 20 U/L (ref 15–41)
Albumin: 3.3 g/dL — ABNORMAL LOW (ref 3.5–5.0)
Alkaline Phosphatase: 66 U/L (ref 38–126)
Anion gap: 7 (ref 5–15)
BUN: 32 mg/dL — ABNORMAL HIGH (ref 8–23)
CO2: 20 mmol/L — ABNORMAL LOW (ref 22–32)
Calcium: 8.6 mg/dL — ABNORMAL LOW (ref 8.9–10.3)
Chloride: 111 mmol/L (ref 98–111)
Creatinine: 1.69 mg/dL — ABNORMAL HIGH (ref 0.61–1.24)
GFR, Est AFR Am: 41 mL/min — ABNORMAL LOW (ref >60)
GFR, Estimated: 36 mL/min — ABNORMAL LOW (ref >60)
Glucose, Bld: 128 mg/dL — ABNORMAL HIGH (ref 70–99)
Potassium: 4.1 mmol/L (ref 3.5–5.1)
Sodium: 138 mmol/L (ref 135–145)
Total Bilirubin: 0.5 mg/dL (ref 0.3–1.2)
Total Protein: 6.3 g/dL — ABNORMAL LOW (ref 6.5–8.1)

## 2019-05-13 LAB — SAMPLE TO BLOOD BANK

## 2019-05-13 LAB — CBC WITH DIFFERENTIAL (CANCER CENTER ONLY)
Abs Immature Granulocytes: 0.06 10*3/uL (ref 0.00–0.07)
Basophils Absolute: 0.1 10*3/uL (ref 0.0–0.1)
Basophils Relative: 1 %
Eosinophils Absolute: 0.3 10*3/uL (ref 0.0–0.5)
Eosinophils Relative: 6 %
HCT: 21.9 % — ABNORMAL LOW (ref 39.0–52.0)
Hemoglobin: 7.3 g/dL — ABNORMAL LOW (ref 13.0–17.0)
Immature Granulocytes: 1 %
Lymphocytes Relative: 4 %
Lymphs Abs: 0.2 10*3/uL — ABNORMAL LOW (ref 0.7–4.0)
MCH: 31.6 pg (ref 26.0–34.0)
MCHC: 33.3 g/dL (ref 30.0–36.0)
MCV: 94.8 fL (ref 80.0–100.0)
Monocytes Absolute: 1.1 10*3/uL — ABNORMAL HIGH (ref 0.1–1.0)
Monocytes Relative: 21 %
Neutro Abs: 3.5 10*3/uL (ref 1.7–7.7)
Neutrophils Relative %: 67 %
Platelet Count: 163 10*3/uL (ref 150–400)
RBC: 2.31 MIL/uL — ABNORMAL LOW (ref 4.22–5.81)
RDW: 17.1 % — ABNORMAL HIGH (ref 11.5–15.5)
WBC Count: 5.2 10*3/uL (ref 4.0–10.5)
nRBC: 0 % (ref 0.0–0.2)

## 2019-05-13 LAB — PREPARE RBC (CROSSMATCH)

## 2019-05-13 LAB — LACTATE DEHYDROGENASE: LDH: 243 U/L — ABNORMAL HIGH (ref 98–192)

## 2019-05-13 MED ORDER — PALONOSETRON HCL INJECTION 0.25 MG/5ML
0.2500 mg | Freq: Once | INTRAVENOUS | Status: AC
  Administered 2019-05-13: 0.25 mg via INTRAVENOUS

## 2019-05-13 MED ORDER — SODIUM CHLORIDE 0.9 % IV SOLN
75.0000 mg/m2 | Freq: Once | INTRAVENOUS | Status: AC
  Administered 2019-05-13: 125 mg via INTRAVENOUS
  Filled 2019-05-13: qty 5

## 2019-05-13 MED ORDER — DEXAMETHASONE SODIUM PHOSPHATE 10 MG/ML IJ SOLN
INTRAMUSCULAR | Status: AC
  Filled 2019-05-13: qty 1

## 2019-05-13 MED ORDER — DEXAMETHASONE SODIUM PHOSPHATE 10 MG/ML IJ SOLN
10.0000 mg | Freq: Once | INTRAMUSCULAR | Status: AC
  Administered 2019-05-13: 10 mg via INTRAVENOUS

## 2019-05-13 MED ORDER — DARBEPOETIN ALFA 300 MCG/0.6ML IJ SOSY
PREFILLED_SYRINGE | INTRAMUSCULAR | Status: AC
  Filled 2019-05-13: qty 0.6

## 2019-05-13 MED ORDER — DARBEPOETIN ALFA 300 MCG/0.6ML IJ SOSY
300.0000 ug | PREFILLED_SYRINGE | INTRAMUSCULAR | Status: DC
  Administered 2019-05-13: 13:00:00 300 ug via SUBCUTANEOUS

## 2019-05-13 MED ORDER — PALONOSETRON HCL INJECTION 0.25 MG/5ML
INTRAVENOUS | Status: AC
  Filled 2019-05-13: qty 5

## 2019-05-13 MED ORDER — SODIUM CHLORIDE 0.9 % IV SOLN
Freq: Once | INTRAVENOUS | Status: AC
  Administered 2019-05-13: 11:00:00 via INTRAVENOUS
  Filled 2019-05-13: qty 250

## 2019-05-13 NOTE — Patient Instructions (Signed)
Blood Transfusion, Adult A blood transfusion is a procedure in which you are given blood through an IV tube. You may need this procedure because of:  Illness.  Surgery.  Injury. The blood may come from someone else (a donor). You may also be able to donate blood for yourself (autologous blood donation). The blood given in a transfusion is made up of different types of cells. You may get:  Red blood cells. These carry oxygen to the cells in the body.  White blood cells. These help you fight infections.  Platelets. These help your blood to clot.  Plasma. This is the liquid part of your blood. It helps with fluid imbalances. If you have a clotting disorder, you may also get other types of blood products. What happens before the procedure?  You will have a blood test to find out your blood type. The test also finds out what type of blood your body will accept and matches it to the donor type.  If you are going to have a planned surgery, you may be able to donate your own blood. This may be done in case you need a transfusion.  If you have had an allergic reaction to a transfusion in the past, you may be given medicine to help prevent a reaction. This medicine may be given to you by mouth or through an IV.  You will have your temperature, blood pressure, and pulse checked.  Follow instructions from your doctor about what you cannot eat or drink.  Ask your doctor about: ? Changing or stopping your regular medicines. This is important if you take diabetes medicines or blood thinners. ? Taking medicines such as aspirin and ibuprofen. These medicines can thin your blood. Do not take these medicines before your procedure if your doctor tells you not to. What happens during the procedure?  An IV tube will be put into one of your veins.  The bag of donated blood will be attached to your IV tube. Then, the blood will enter through your vein.  Your temperature, blood pressure, and pulse will  be checked regularly during the procedure. This is done to find early signs of a transfusion reaction.  If you have any signs or symptoms of a reaction, your transfusion will be stopped. You may also be given medicine.  When the transfusion is done, your IV tube will be taken out.  Pressure may be applied to the IV site for a few minutes.  A bandage (dressing) will be put on the IV site. The procedure may vary among doctors and hospitals. What happens after the procedure?  Your temperature, blood pressure, heart rate, breathing rate, and blood oxygen level will be checked often.  Your blood may be tested to see how you are responding to the transfusion.  You may be warmed with fluids or blankets. This is done to keep the temperature of your body normal. Summary  A blood transfusion is a procedure in which you are given blood through an IV tube.  The blood may come from someone else (a donor). You may also be able to donate blood for yourself.  If you have had an allergic reaction to a transfusion in the past, you may be given medicine to help prevent a reaction. This medicine may be given to you by mouth or through an IV tube.  Your temperature, blood pressure, heart rate, breathing rate, and blood oxygen level will be checked often.  Your blood may be tested to see  how you are responding to the transfusion. This information is not intended to replace advice given to you by your health care provider. Make sure you discuss any questions you have with your health care provider. Document Released: 10/25/2008 Document Revised: 09/18/2016 Document Reviewed: 03/22/2016 Elsevier Patient Education  [place]. Darbepoetin Alfa injection What is this medicine? DARBEPOETIN ALFA (dar be POE e tin AL fa) helps your body make more red blood cells. It is used to treat anemia caused by chronic kidney failure and chemotherapy. This medicine may be used for other purposes; ask your health  care provider or pharmacist if you have questions. COMMON BRAND NAME(S): Aranesp What should I tell my health care provider before I take this medicine? They need to know if you have any of these conditions:  blood clotting disorders or history of blood clots  cancer patient not on chemotherapy  cystic fibrosis  heart disease, such as angina, heart failure, or a history of a heart attack  hemoglobin level of 12 g/dL or greater  high blood pressure  low levels of folate, iron, or vitamin B12  seizures  an unusual or allergic reaction to darbepoetin, erythropoietin, albumin, hamster proteins, latex, other medicines, foods, dyes, or preservatives  pregnant or trying to get pregnant  breast-feeding How should I use this medicine? This medicine is for injection into a vein or under the skin. It is usually given by a health care professional in a hospital or clinic setting. If you get this medicine at home, you will be taught how to prepare and give this medicine. Use exactly as directed. Take your medicine at regular intervals. Do not take your medicine more often than directed. It is important that you put your used needles and syringes in a special sharps container. Do not put them in a trash can. If you do not have a sharps container, call your pharmacist or healthcare provider to get one. A special MedGuide will be given to you by the pharmacist with each prescription and refill. Be sure to read this information carefully each time. Talk to your pediatrician regarding the use of this medicine in children. While this medicine may be used in children as young as 29 month of age for selected conditions, precautions do apply. Overdosage: If you think you have taken too much of this medicine contact a poison control center or emergency room at once. NOTE: This medicine is only for you. Do not share this medicine with others. What if I miss a dose? If you miss a dose, take it as soon as  you can. If it is almost time for your next dose, take only that dose. Do not take double or extra doses. What may interact with this medicine? Do not take this medicine with any of the following medications:  epoetin alfa This list may not describe all possible interactions. Give your health care provider a list of all the medicines, herbs, non-prescription drugs, or dietary supplements you use. Also tell them if you smoke, drink alcohol, or use illegal drugs. Some items may interact with your medicine. What should I watch for while using this medicine? Your condition will be monitored carefully while you are receiving this medicine. You may need blood work done while you are taking this medicine. This medicine may cause a decrease in vitamin B6. You should make sure that you get enough vitamin B6 while you are taking this medicine. Discuss the foods you eat and the vitamins you take with  your health care professional. What side effects may I notice from receiving this medicine? Side effects that you should report to your doctor or health care professional as soon as possible:  allergic reactions like skin rash, itching or hives, swelling of the face, lips, or tongue  breathing problems  changes in vision  chest pain  confusion, trouble speaking or understanding  feeling faint or lightheaded, falls  high blood pressure  muscle aches or pains  pain, swelling, warmth in the leg  rapid weight gain  severe headaches  sudden numbness or weakness of the face, arm or leg  trouble walking, dizziness, loss of balance or coordination  seizures (convulsions)  swelling of the ankles, feet, hands  unusually weak or tired Side effects that usually do not require medical attention (report to your doctor or health care professional if they continue or are bothersome):  diarrhea  fever, chills (flu-like symptoms)  headaches  nausea, vomiting  redness, stinging, or swelling at  site where injected This list may not describe all possible side effects. Call your doctor for medical advice about side effects. You may report side effects to FDA at 1-800-FDA-1088. Where should I keep my medicine? Keep out of the reach of children. Store in a refrigerator between 2 and 8 degrees C (36 and 46 degrees F). Do not freeze. Do not shake. Throw away any unused portion if using a single-dose vial. Throw away any unused medicine after the expiration date. NOTE: This sheet is a summary. It may not cover all possible information. If you have questions about this medicine, talk to your doctor, pharmacist, or health care provider.  2020 Elsevier/Gold Standard (2017-08-13 16:44:20) Bendamustine Injection What is this medicine? BENDAMUSTINE (BEN da MUS teen) is a chemotherapy drug. It is used to treat chronic lymphocytic leukemia and non-Hodgkin lymphoma. This medicine may be used for other purposes; ask your health care provider or pharmacist if you have questions. COMMON BRAND NAME(S): Kristine Royal, Treanda What should I tell my health care provider before I take this medicine? They need to know if you have any of these conditions:  infection (especially a virus infection such as chickenpox, cold sores, or herpes)  kidney disease  liver disease  an unusual or allergic reaction to bendamustine, mannitol, other medicines, foods, dyes, or preservatives  pregnant or trying to get pregnant  breast-feeding How should I use this medicine? This medicine is for infusion into a vein. It is given by a health care professional in a hospital or clinic setting. Talk to your pediatrician regarding the use of this medicine in children. Special care may be needed. Overdosage: If you think you have taken too much of this medicine contact a poison control center or emergency room at once. NOTE: This medicine is only for you. Do not share this medicine with others. What if I miss a dose? It  is important not to miss your dose. Call your doctor or health care professional if you are unable to keep an appointment. What may interact with this medicine? Do not take this medicine with any of the following medications:  clozapine This medicine may also interact with the following medications:  atazanavir  cimetidine  ciprofloxacin  enoxacin  fluvoxamine  medicines for seizures like carbamazepine and phenobarbital  mexiletine  rifampin  tacrine  thiabendazole  zileuton This list may not describe all possible interactions. Give your health care provider a list of all the medicines, herbs, non-prescription drugs, or dietary supplements you use. Also  tell them if you smoke, drink alcohol, or use illegal drugs. Some items may interact with your medicine. What should I watch for while using this medicine? This drug may make you feel generally unwell. This is not uncommon, as chemotherapy can affect healthy cells as well as cancer cells. Report any side effects. Continue your course of treatment even though you feel ill unless your doctor tells you to stop. You may need blood work done while you are taking this medicine. Call your doctor or healthcare provider for advice if you get a fever, chills or sore throat, or other symptoms of a cold or flu. Do not treat yourself. This drug decreases your body's ability to fight infections. Try to avoid being around people who are sick. This medicine may cause serious skin reactions. They can happen weeks to months after starting the medicine. Contact your healthcare provider right away if you notice fevers or flu-like symptoms with a rash. The rash may be red or purple and then turn into blisters or peeling of the skin. Or, you might notice a red rash with swelling of the face, lips or lymph nodes in your neck or under your arms. This medicine may increase your risk to bruise or bleed. Call your doctor or healthcare provider if you notice  any unusual bleeding. Talk to your doctor about your risk of cancer. You may be more at risk for certain types of cancers if you take this medicine. Do not become pregnant while taking this medicine or for at least 6 months after stopping it. Women should inform their doctor if they wish to become pregnant or think they might be pregnant. Men should not father a child while taking this medicine and for at least 3 months after stopping it. There is a potential for serious side effects to an unborn child. Talk to your healthcare provider or pharmacist for more information. Do not breast-feed an infant while taking this medicine or for at least 1 week after stopping it. This medicine may make it more difficult to father a child. You should talk with your doctor or healthcare provider if you are concerned about your fertility. What side effects may I notice from receiving this medicine? Side effects that you should report to your doctor or health care professional as soon as possible:  allergic reactions like skin rash, itching or hives, swelling of the face, lips, or tongue  low blood counts - this medicine may decrease the number of white blood cells, red blood cells and platelets. You may be at increased risk for infections and bleeding.  rash, fever, and swollen lymph nodes  redness, blistering, peeling, or loosening of the skin, including inside the mouth  signs of infection like fever or chills, cough, sore throat, pain or difficulty passing urine  signs of decreased platelets or bleeding like bruising, pinpoint red spots on the skin, black, tarry stools, blood in the urine  signs of decreased red blood cells like being unusually weak or tired, fainting spells, lightheadedness  signs and symptoms of kidney injury like trouble passing urine or change in the amount of urine  signs and symptoms of liver injury like dark yellow or brown urine; general ill feeling or flu-like symptoms;  light-colored stools; loss of appetite; nausea; right upper belly pain; unusually weak or tired; yellowing of the eyes or skin Side effects that usually do not require medical attention (report to your doctor or health care professional if they continue or are bothersome):  constipation  decreased appetite  diarrhea  headache  mouth sores  nausea, vomiting  tiredness This list may not describe all possible side effects. Call your doctor for medical advice about side effects. You may report side effects to FDA at 1-800-FDA-1088. Where should I keep my medicine? This drug is given in a hospital or clinic and will not be stored at home. NOTE: This sheet is a summary. It may not cover all possible information. If you have questions about this medicine, talk to your doctor, pharmacist, or health care provider.  2020 Elsevier/Gold Standard (2018-10-20 10:26:46)

## 2019-05-13 NOTE — Telephone Encounter (Signed)
Received a call from [facility] in [facility] stating that because patient has received Daratumamab in the past she doesn't have a compatible unit to give [place] today.  [place] for patient to receive both units tomorrow on Friday.

## 2019-05-13 NOTE — Telephone Encounter (Signed)
Notified of panic Hgb of 7.3.  Dr. Johnette Abraham notified.  Will give 1 u today and 1 tomorrow .  Schedulers notified.

## 2019-05-13 NOTE — Progress Notes (Signed)
Hematology and Oncology Follow Up Visit  REDGIE MASCH [id number] [birth year] 85 y.o. 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 Palliative radiation therapy to right ilium Kyprolis/Cytoxanq 3 week dosing- s/p cycle 24 (Cytoxan restarted on cycle 13) - DC'd due to progression Radiation therapy to right femur, 10 fraction --completed 3000 rad on 11/06/2018 Elotuzomab - started 10/29/2018 -- s/p cycle #1 -- d/c on 12/24/2018 Pomalidomide 2 mg po q day (21 on/7 off) - started 10/29/2018 -- d/c 12/31/2018 Daratumumab - start cycle # 1 on 12/31/2018 -- d/c on -02/04/2019  Current Therapy:   Bendamustine/Decadron -- started on 02/10/2019, s/p cycle #3 Zometa IV q 4 weeks - on hold Aranesp 300 g subcutaneous as needed for hemoglobin less than 10   Interim History:  Mr. Cansler is here today for follow-up and treatment.  So far, he is responded incredibly well to treatment.  I am very happy that his quality life is doing well.  His kappa light chain went from over 1000 mg/L down to 300 mg/L.  His M spike went from 2.1 g/dL down to 1.3 g/dL.  His IgG level went from about 3000 mg/dL down to 1700 mg/dL.  He is a little bit tired today.  He has a hemoglobin of 7.3.  We will have to transfuse him.  I will give a dose of Aranesp today.  He has had no bleeding.  His appetite is doing okay.  I really cannot get him back on Megace due to the fact that he did have this TIA.  There is been no problems with nausea or vomiting.  He has had no leg swelling.  He does have some neuropathy in his feet which is stable.  Overall, I would say his performance status is ECOG 1.    Medications:  Allergies as of 05/13/2019    Reactions   Sulfa Antibiotics Itching   Sulfasalazine Itching      Medication List       Accurate as of May 13, 2019 10:55 AM. If you have any questions, ask your nurse or doctor.        amLODipine-benazepril 5-10 MG capsule Commonly known as: LOTREL TAKE ONE CAPSULE EACH DAY What changed: See the new instructions.   aspirin 81 MG EC tablet Take 1 tablet (81 mg total) by mouth daily.   clopidogrel 75 MG tablet Commonly known as: PLAVIX Take 1 tablet (75 mg total) by mouth daily for 21 days. Take with aspirin 81 x 21 days then stop plavix and continue aspirin 81 daily   dextromethorphan-guaiFENesin 30-600 MG 12hr tablet Commonly known as: MUCINEX DM Take 1 tablet by mouth 2 (two) times daily as needed for cough.   dronabinol 5 MG capsule Commonly known as: MARINOL TAKE ONE CAPSULE TWICE DAILY BEFORE MEALS   finasteride 5 MG tablet Commonly known as: PROSCAR TAKE ONE TABLET EACH DAY   lidocaine-prilocaine cream Commonly known as: EMLA Apply to affected area once   LORazepam 0.5 MG tablet Commonly known as: Ativan Take 1 tablet (0.5 mg total) by mouth every 6 (six) hours as needed (Nausea or vomiting).   megestrol 400 MG/10ML suspension Commonly known as: MEGACE Take 15 mLs (600 mg total) by mouth daily.   multivitamin with minerals  Tabs tablet Take 1 tablet by mouth daily.   ondansetron 8 MG tablet Commonly known as: Zofran Take 1 tablet (8 mg total) by mouth 2 (two) times daily as needed for refractory nausea / vomiting. Start on day 2 after Bendamustine chemotherapy.   penicillin v potassium 500 MG tablet Commonly known as: VEETID Take 1 tablet (500 mg total) by mouth 4 (four) times daily. #120   polyvinyl alcohol 1.4 % ophthalmic solution Commonly known as: LIQUIFILM TEARS Place 1 drop into both eyes as needed for dry eyes.   PROBIOTIC PO Take 1 capsule by mouth daily.   prochlorperazine 10 MG tablet Commonly known as: COMPAZINE Take 1 tablet  (10 mg total) by mouth every 6 (six) hours as needed (nausea and vomiting).   pyridOXINE 100 MG tablet Commonly known as: VITAMIN B-6 Take 100 mg by mouth daily.   traMADol 50 MG tablet Commonly known as: ULTRAM Take 1 tablet (50 mg total) by mouth every 8 (eight) hours as needed for moderate pain.   vitamin B-12 500 MCG tablet Commonly known as: CYANOCOBALAMIN Take 500 mcg by mouth daily.   Vitamin D (Ergocalciferol) 1.25 MG (50000 UT) Caps capsule Commonly known as: DRISDOL Take 50,000 Units by mouth every Sunday.       Allergies:  Allergies  Allergen Reactions  . Sulfa Antibiotics Itching  . Sulfasalazine Itching    Past Medical History, Surgical history, Social history, and Family History were reviewed and updated.  Review of Systems:   Review of Systems  Constitutional: Positive for malaise/fatigue.  HENT: Negative.   Eyes: Negative.   Respiratory: Negative.   Cardiovascular: Negative.   Gastrointestinal: Negative.   Genitourinary: Negative.   Musculoskeletal: Negative.   Skin: Negative.   Neurological: Negative.   Endo/Heme/Allergies: Negative.   Psychiatric/Behavioral: Negative.      Physical Exam:  weight is 146 lb (66.2 kg). His temporal temperature is 97.5 F (36.4 C) (abnormal). His blood pressure is 137/72 and his pulse is 82. His respiration is 19 and oxygen saturation is 100%.   Wt Readings from Last 3 Encounters:  05/13/19 146 lb (66.2 kg)  04/19/19 139 lb 5.3 oz (63.2 kg)  04/15/19 148 lb 9.6 oz (67.4 kg)    Physical Exam Vitals signs reviewed.  HENT:     Head: Normocephalic and atraumatic.  Eyes:     Pupils: Pupils are equal, round, and reactive to light.  Neck:     Musculoskeletal: Normal range of motion.  Cardiovascular:     Rate and Rhythm: Normal rate and regular rhythm.     Heart sounds: Normal heart sounds.  Pulmonary:     Effort: Pulmonary effort is normal.     Breath sounds: Normal breath sounds.  Abdominal:      General: Bowel sounds are normal.     Palpations: Abdomen is soft.  Musculoskeletal: Normal range of motion.        General: No tenderness or deformity.  Lymphadenopathy:     Cervical: No cervical adenopathy.  Skin:    General: Skin is warm and dry.     Findings: No erythema or rash.  Neurological:     Mental Status: He is alert and oriented to person, place, and time.  Psychiatric:        Behavior: Behavior normal.        Thought Content: Thought content normal.        Judgment: Judgment normal.      Lab Results  Component Value Date  WBC 5.2 05/13/2019   HGB 7.3 (L) 05/13/2019   HCT 21.9 (L) 05/13/2019   MCV 94.8 05/13/2019   PLT 163 05/13/2019   Lab Results  Component Value Date   FERRITIN 125 12/10/2018   IRON 40 (L) 12/10/2018   TIBC 220 12/10/2018   UIBC 180 12/10/2018   IRONPCTSAT 18 (L) 12/10/2018   Lab Results  Component Value Date   RETICCTPCT 1.3 12/10/2018   RBC 2.31 (L) 05/13/2019   Lab Results  Component Value Date   KPAFRELGTCHN 1.2 (L) 04/06/2019   LAMBDASER 295.5 (H) 04/06/2019   KAPLAMBRATIO 0.00 (L) 04/06/2019   Lab Results  Component Value Date   IGGSERUM 1,699 (H) 04/06/2019   IGA 6 (L) 04/06/2019   IGMSERUM 6 (L) 04/06/2019   Lab Results  Component Value Date   TOTALPROTELP 6.3 04/06/2019   ALBUMINELP 2.6 (L) 04/06/2019   A1GS 0.3 04/06/2019   A2GS 1.2 (H) 04/06/2019   BETS 0.7 04/06/2019   BETA2SER 0.3 07/28/2015   GAMS 1.5 04/06/2019   MSPIKE 1.3 (H) 04/06/2019   SPEI Comment 11/26/2018     Chemistry      Component Value Date/Time   NA 138 05/13/2019 0935   NA 146 (H) 08/07/2017 1153   NA 141 01/09/2017 1004   K 4.1 05/13/2019 0935   K 4.6 08/07/2017 1153   K 4.4 01/09/2017 1004   CL 111 05/13/2019 0935   CL 108 08/07/2017 1153   CO2 20 (L) 05/13/2019 0935   CO2 26 08/07/2017 1153   CO2 22 01/09/2017 1004   BUN 32 (H) 05/13/2019 0935   BUN 24 (H) 08/07/2017 1153   BUN 23.9 01/09/2017 1004   CREATININE 1.69 (H)  05/13/2019 0935   CREATININE 1.56 (H) 05/26/2018 1508   CREATININE 1.5 (H) 01/09/2017 1004      Component Value Date/Time   CALCIUM 8.6 (L) 05/13/2019 0935   CALCIUM 8.8 08/07/2017 1153   CALCIUM 8.8 01/09/2017 1004   ALKPHOS 66 05/13/2019 0935   ALKPHOS 97 (H) 08/07/2017 1153   ALKPHOS 80 01/09/2017 1004   AST 20 05/13/2019 0935   AST 18 01/09/2017 1004   ALT 13 05/13/2019 0935   ALT 20 08/07/2017 1153   ALT 12 01/09/2017 1004   BILITOT 0.5 05/13/2019 0935   BILITOT 0.92 01/09/2017 1004       Impression and Plan: Mr. Galo is a very pleasant 85 yo caucasian gentleman with relapsed IgG lambda myeloma.   I am glad that his myeloma levels have come down so nicely.  I am just happy that treatment is working and his quality of life is doing okay.  His renal function is doing a little bit better so this is always a positive.  We will have to give him 2 units of blood.  I will plan to treat him today.  We will give 1 unit of blood today and then 1 unit of blood tomorrow.  I would like to plan to get him back in another month.  I am just very happy that his quality of life is doing well.  Vital see any issues that would prevent him from having a good holiday season this year.    [name], MD 10/1/202010:55 AM

## 2019-05-14 ENCOUNTER — Inpatient Hospital Stay: Payer: Medicare HMO

## 2019-05-14 ENCOUNTER — Other Ambulatory Visit: Payer: Self-pay | Admitting: *Deleted

## 2019-05-14 ENCOUNTER — Other Ambulatory Visit: Payer: Self-pay | Admitting: Hematology & Oncology

## 2019-05-14 ENCOUNTER — Other Ambulatory Visit: Payer: Self-pay

## 2019-05-14 VITALS — BP 125/65 | HR 75 | Temp 97.4°F | Resp 18

## 2019-05-14 DIAGNOSIS — D6481 Anemia due to antineoplastic chemotherapy: Secondary | ICD-10-CM | POA: Diagnosis not present

## 2019-05-14 DIAGNOSIS — C9000 Multiple myeloma not having achieved remission: Secondary | ICD-10-CM

## 2019-05-14 DIAGNOSIS — C9002 Multiple myeloma in relapse: Secondary | ICD-10-CM | POA: Diagnosis not present

## 2019-05-14 DIAGNOSIS — Z5111 Encounter for antineoplastic chemotherapy: Secondary | ICD-10-CM | POA: Diagnosis not present

## 2019-05-14 DIAGNOSIS — I10 Essential (primary) hypertension: Secondary | ICD-10-CM

## 2019-05-14 DIAGNOSIS — R5383 Other fatigue: Secondary | ICD-10-CM | POA: Diagnosis not present

## 2019-05-14 DIAGNOSIS — Z7902 Long term (current) use of antithrombotics/antiplatelets: Secondary | ICD-10-CM | POA: Diagnosis not present

## 2019-05-14 DIAGNOSIS — G629 Polyneuropathy, unspecified: Secondary | ICD-10-CM | POA: Diagnosis not present

## 2019-05-14 DIAGNOSIS — Z79899 Other long term (current) drug therapy: Secondary | ICD-10-CM | POA: Diagnosis not present

## 2019-05-14 DIAGNOSIS — D649 Anemia, unspecified: Secondary | ICD-10-CM

## 2019-05-14 DIAGNOSIS — Z882 Allergy status to sulfonamides status: Secondary | ICD-10-CM | POA: Diagnosis not present

## 2019-05-14 DIAGNOSIS — N289 Disorder of kidney and ureter, unspecified: Secondary | ICD-10-CM | POA: Diagnosis not present

## 2019-05-14 LAB — IGG, IGA, IGM
IgA: 7 mg/dL — ABNORMAL LOW (ref 61–437)
IgG (Immunoglobin G), Serum: 1297 mg/dL (ref 603–1613)
IgM (Immunoglobulin M), Srm: <5 mg/dL — ABNORMAL LOW (ref 15–143)

## 2019-05-14 LAB — KAPPA/LAMBDA LIGHT CHAINS
Kappa free light chain: 1.9 mg/L — ABNORMAL LOW (ref 3.3–19.4)
Kappa, lambda light chain ratio: 0.01 — ABNORMAL LOW (ref 0.26–1.65)
Lambda free light chains: 239.1 mg/L — ABNORMAL HIGH (ref 5.7–26.3)

## 2019-05-14 MED ORDER — CLOPIDOGREL BISULFATE 75 MG PO TABS: 75.0000 mg | ORAL_TABLET | Freq: Every day | ORAL | 0 refills | Status: DC

## 2019-05-14 MED ORDER — FUROSEMIDE 10 MG/ML IJ SOLN
INTRAMUSCULAR | Status: AC
  Filled 2019-05-14: qty 4

## 2019-05-14 MED ORDER — INFLUENZA VAC SPLIT QUAD 0.5 ML IM SUSY
PREFILLED_SYRINGE | INTRAMUSCULAR | Status: AC
  Filled 2019-05-14: qty 0.5

## 2019-05-14 MED ORDER — SODIUM CHLORIDE 0.9% IV SOLUTION
250.0000 mL | Freq: Once | INTRAVENOUS | Status: DC
  Filled 2019-05-14: qty 250

## 2019-05-14 MED ORDER — DIPHENHYDRAMINE HCL 25 MG PO CAPS
ORAL_CAPSULE | ORAL | Status: AC
  Filled 2019-05-14: qty 1

## 2019-05-14 MED ORDER — SODIUM CHLORIDE 0.9 % IV SOLN
75.0000 mg/m2 | Freq: Once | INTRAVENOUS | Status: AC
  Administered 2019-05-14: 15:00:00 125 mg via INTRAVENOUS
  Filled 2019-05-14: qty 5

## 2019-05-14 MED ORDER — ACETAMINOPHEN 325 MG PO TABS
ORAL_TABLET | ORAL | Status: AC
  Filled 2019-05-14: qty 2

## 2019-05-14 MED ORDER — SODIUM CHLORIDE 0.9 % IV SOLN
Freq: Once | INTRAVENOUS | Status: DC
  Filled 2019-05-14: qty 250

## 2019-05-14 MED ORDER — ACETAMINOPHEN 325 MG PO TABS
650.0000 mg | ORAL_TABLET | Freq: Once | ORAL | Status: AC
  Administered 2019-05-14: 650 mg via ORAL

## 2019-05-14 MED ORDER — FUROSEMIDE 10 MG/ML IJ SOLN: 20.0000 mg | Freq: Once | INTRAMUSCULAR | Status: DC

## 2019-05-14 MED ORDER — DEXAMETHASONE SODIUM PHOSPHATE 10 MG/ML IJ SOLN
INTRAMUSCULAR | Status: AC
  Filled 2019-05-14: qty 1

## 2019-05-14 MED ORDER — DIPHENHYDRAMINE HCL 25 MG PO CAPS
25.0000 mg | ORAL_CAPSULE | Freq: Once | ORAL | Status: AC
  Administered 2019-05-14: 25 mg via ORAL

## 2019-05-14 MED ORDER — DEXAMETHASONE SODIUM PHOSPHATE 10 MG/ML IJ SOLN
10.0000 mg | Freq: Once | INTRAMUSCULAR | Status: AC
  Administered 2019-05-14: 10 mg via INTRAVENOUS

## 2019-05-14 NOTE — Patient Instructions (Signed)
Blood Transfusion, Adult, Care After This sheet gives you information about how to care for yourself after your procedure. Your doctor may also give you more specific instructions. If you have problems or questions, contact your doctor. Follow these instructions at home:   Take over-the-counter and prescription medicines only as told by your doctor.  Go back to your normal activities as told by your doctor.  Follow instructions from your doctor about how to take care of the area where an IV tube was put into your vein (insertion site). Make sure you: ? Wash your hands with soap and water before you change your bandage (dressing). If there is no soap and water, use hand sanitizer. ? Change your bandage as told by your doctor.  Check your IV insertion site every day for signs of infection. Check for: ? More redness, swelling, or pain. ? More fluid or blood. ? Warmth. ? Pus or a bad smell. Contact a doctor if:  You have more redness, swelling, or pain around the IV insertion site.  You have more fluid or blood coming from the IV insertion site.  Your IV insertion site feels warm to the touch.  You have pus or a bad smell coming from the IV insertion site.  Your pee (urine) turns pink, red, or brown.  You feel weak after doing your normal activities. Get help right away if:  You have signs of a serious allergic or body defense (immune) system reaction, including: ? Itchiness. ? Hives. ? Trouble breathing. ? Anxiety. ? Pain in your chest or lower back. ? Fever, flushing, and chills. ? Fast pulse. ? Rash. ? Watery poop (diarrhea). ? Throwing up (vomiting). ? Dark pee. ? Serious headache. ? Dizziness. ? Stiff neck. ? Yellow color in your face or the white parts of your eyes (jaundice). Summary  After a blood transfusion, return to your normal activities as told by your doctor.  Every day, check for signs of infection where the IV tube was put into your vein.  Some  signs of infection are warm skin, more redness and pain, more fluid or blood, and pus or a bad smell where the needle went in.  Contact your doctor if you feel weak or have any unusual symptoms. This information is not intended to replace advice given to you by your health care provider. Make sure you discuss any questions you have with your health care provider. Document Released: 08/19/2014 Document Revised: 12/03/2017 Document Reviewed: 03/22/2016 Elsevier Patient Education  [place].  Bendamustine Injection What is this medicine? BENDAMUSTINE (BEN da MUS teen) is a chemotherapy drug. It is used to treat chronic lymphocytic leukemia and non-Hodgkin lymphoma. This medicine may be used for other purposes; ask your health care provider or pharmacist if you have questions. COMMON BRAND NAME(S): Kristine Royal, Treanda What should I tell my health care provider before I take this medicine? They need to know if you have any of these conditions:  infection (especially a virus infection such as chickenpox, cold sores, or herpes)  kidney disease  liver disease  an unusual or allergic reaction to bendamustine, mannitol, other medicines, foods, dyes, or preservatives  pregnant or trying to get pregnant  breast-feeding How should I use this medicine? This medicine is for infusion into a vein. It is given by a health care professional in a hospital or clinic setting. Talk to your pediatrician regarding the use of this medicine in children. Special care may be needed. Overdosage: If you think  you have taken too much of this medicine contact a poison control center or emergency room at once. NOTE: This medicine is only for you. Do not share this medicine with others. What if I miss a dose? It is important not to miss your dose. Call your doctor or health care professional if you are unable to keep an appointment. What may interact with this medicine? Do not take this medicine with  any of the following medications:  clozapine This medicine may also interact with the following medications:  atazanavir  cimetidine  ciprofloxacin  enoxacin  fluvoxamine  medicines for seizures like carbamazepine and phenobarbital  mexiletine  rifampin  tacrine  thiabendazole  zileuton This list may not describe all possible interactions. Give your health care provider a list of all the medicines, herbs, non-prescription drugs, or dietary supplements you use. Also tell them if you smoke, drink alcohol, or use illegal drugs. Some items may interact with your medicine. What should I watch for while using this medicine? This drug may make you feel generally unwell. This is not uncommon, as chemotherapy can affect healthy cells as well as cancer cells. Report any side effects. Continue your course of treatment even though you feel ill unless your doctor tells you to stop. You may need blood work done while you are taking this medicine. Call your doctor or healthcare provider for advice if you get a fever, chills or sore throat, or other symptoms of a cold or flu. Do not treat yourself. This drug decreases your body's ability to fight infections. Try to avoid being around people who are sick. This medicine may cause serious skin reactions. They can happen weeks to months after starting the medicine. Contact your healthcare provider right away if you notice fevers or flu-like symptoms with a rash. The rash may be red or purple and then turn into blisters or peeling of the skin. Or, you might notice a red rash with swelling of the face, lips or lymph nodes in your neck or under your arms. This medicine may increase your risk to bruise or bleed. Call your doctor or healthcare provider if you notice any unusual bleeding. Talk to your doctor about your risk of cancer. You may be more at risk for certain types of cancers if you take this medicine. Do not become pregnant while taking this  medicine or for at least 6 months after stopping it. Women should inform their doctor if they wish to become pregnant or think they might be pregnant. Men should not father a child while taking this medicine and for at least 3 months after stopping it. There is a potential for serious side effects to an unborn child. Talk to your healthcare provider or pharmacist for more information. Do not breast-feed an infant while taking this medicine or for at least 1 week after stopping it. This medicine may make it more difficult to father a child. You should talk with your doctor or healthcare provider if you are concerned about your fertility. What side effects may I notice from receiving this medicine? Side effects that you should report to your doctor or health care professional as soon as possible:  allergic reactions like skin rash, itching or hives, swelling of the face, lips, or tongue  low blood counts - this medicine may decrease the number of white blood cells, red blood cells and platelets. You may be at increased risk for infections and bleeding.  rash, fever, and swollen lymph nodes  redness, blistering,  peeling, or loosening of the skin, including inside the mouth  signs of infection like fever or chills, cough, sore throat, pain or difficulty passing urine  signs of decreased platelets or bleeding like bruising, pinpoint red spots on the skin, black, tarry stools, blood in the urine  signs of decreased red blood cells like being unusually weak or tired, fainting spells, lightheadedness  signs and symptoms of kidney injury like trouble passing urine or change in the amount of urine  signs and symptoms of liver injury like dark yellow or brown urine; general ill feeling or flu-like symptoms; light-colored stools; loss of appetite; nausea; right upper belly pain; unusually weak or tired; yellowing of the eyes or skin Side effects that usually do not require medical attention (report to your  doctor or health care professional if they continue or are bothersome):  constipation  decreased appetite  diarrhea  headache  mouth sores  nausea, vomiting  tiredness This list may not describe all possible side effects. Call your doctor for medical advice about side effects. You may report side effects to FDA at 1-800-FDA-1088. Where should I keep my medicine? This drug is given in a hospital or clinic and will not be stored at home. NOTE: This sheet is a summary. It may not cover all possible information. If you have questions about this medicine, talk to your doctor, pharmacist, or health care provider.  2020 Elsevier/Gold Standard (2018-10-20 10:26:46)

## 2019-05-14 NOTE — Telephone Encounter (Signed)
Pt concern about Plavix. [facility] is closed after 12pm on Friday, reopens on Monday. Reviewed with MD, who advised pt to continue Plavix 75mg  1 tablet PO daily. Instructed pt to follow up with [facility]. Rx sent to [facility].

## 2019-05-14 NOTE — Patient Instructions (Signed)
Blood Transfusion, Adult A blood transfusion is a procedure in which you are given blood through an IV tube. You may need this procedure because of:  Illness.  Surgery.  Injury. The blood may come from someone else (a donor). You may also be able to donate blood for yourself (autologous blood donation). The blood given in a transfusion is made up of different types of cells. You may get:  Red blood cells. These carry oxygen to the cells in the body.  White blood cells. These help you fight infections.  Platelets. These help your blood to clot.  Plasma. This is the liquid part of your blood. It helps with fluid imbalances. If you have a clotting disorder, you may also get other types of blood products. What happens before the procedure?  You will have a blood test to find out your blood type. The test also finds out what type of blood your body will accept and matches it to the donor type.  If you are going to have a planned surgery, you may be able to donate your own blood. This may be done in case you need a transfusion.  If you have had an allergic reaction to a transfusion in the past, you may be given medicine to help prevent a reaction. This medicine may be given to you by mouth or through an IV.  You will have your temperature, blood pressure, and pulse checked.  Follow instructions from your doctor about what you cannot eat or drink.  Ask your doctor about: ? Changing or stopping your regular medicines. This is important if you take diabetes medicines or blood thinners. ? Taking medicines such as aspirin and ibuprofen. These medicines can thin your blood. Do not take these medicines before your procedure if your doctor tells you not to. What happens during the procedure?  An IV tube will be put into one of your veins.  The bag of donated blood will be attached to your IV tube. Then, the blood will enter through your vein.  Your temperature, blood pressure, and pulse will  be checked regularly during the procedure. This is done to find early signs of a transfusion reaction.  If you have any signs or symptoms of a reaction, your transfusion will be stopped. You may also be given medicine.  When the transfusion is done, your IV tube will be taken out.  Pressure may be applied to the IV site for a few minutes.  A bandage (dressing) will be put on the IV site. The procedure may vary among doctors and hospitals. What happens after the procedure?  Your temperature, blood pressure, heart rate, breathing rate, and blood oxygen level will be checked often.  Your blood may be tested to see how you are responding to the transfusion.  You may be warmed with fluids or blankets. This is done to keep the temperature of your body normal. Summary  A blood transfusion is a procedure in which you are given blood through an IV tube.  The blood may come from someone else (a donor). You may also be able to donate blood for yourself.  If you have had an allergic reaction to a transfusion in the past, you may be given medicine to help prevent a reaction. This medicine may be given to you by mouth or through an IV tube.  Your temperature, blood pressure, heart rate, breathing rate, and blood oxygen level will be checked often.  Your blood may be tested to see   how you are responding to the transfusion. This information is not intended to replace advice given to you by your health care provider. Make sure you discuss any questions you have with your health care provider. Document Released: 10/25/2008 Document Revised: 09/18/2016 Document Reviewed: 03/22/2016 Elsevier Patient Education  2020 Elsevier Inc.  

## 2019-05-15 LAB — BPAM RBC
Blood Product Expiration Date: 202011102359
Blood Product Expiration Date: 202011102359
ISSUE DATE / TIME: 202010020746
ISSUE DATE / TIME: 202010020746
Unit Type and Rh: 5100
Unit Type and Rh: 5100

## 2019-05-15 LAB — TYPE AND SCREEN
ABO/RH(D): O POS
Antibody Screen: NEGATIVE
Unit division: 0
Unit division: 0

## 2019-05-16 LAB — PROTEIN ELECTROPHORESIS, SERUM
A/G Ratio: 1 (ref 0.7–1.7)
Albumin ELP: 3 g/dL (ref 2.9–4.4)
Alpha-1-Globulin: 0.3 g/dL (ref 0.0–0.4)
Alpha-2-Globulin: 0.9 g/dL (ref 0.4–1.0)
Beta Globulin: 0.7 g/dL (ref 0.7–1.3)
Gamma Globulin: 1 g/dL (ref 0.4–1.8)
Globulin, Total: 2.9 g/dL (ref 2.2–3.9)
M-Spike, %: 0.9 g/dL — ABNORMAL HIGH
Total Protein ELP: 5.9 g/dL — ABNORMAL LOW (ref 6.0–8.5)

## 2019-05-17 ENCOUNTER — Telehealth: Payer: Self-pay | Admitting: *Deleted

## 2019-05-17 ENCOUNTER — Other Ambulatory Visit: Payer: Self-pay | Admitting: Hematology & Oncology

## 2019-05-17 DIAGNOSIS — R2681 Unsteadiness on feet: Secondary | ICD-10-CM | POA: Diagnosis not present

## 2019-05-17 DIAGNOSIS — R262 Difficulty in walking, not elsewhere classified: Secondary | ICD-10-CM | POA: Diagnosis not present

## 2019-05-17 DIAGNOSIS — I69398 Other sequelae of cerebral infarction: Secondary | ICD-10-CM | POA: Diagnosis not present

## 2019-05-17 DIAGNOSIS — M6281 Muscle weakness (generalized): Secondary | ICD-10-CM | POA: Diagnosis not present

## 2019-05-17 NOTE — Telephone Encounter (Signed)
-----   Message from Christian Napoleon, MD sent at 05/17/2019  8:44 AM EDT ----- calll - the myeloma is still improving!!!  Great job!!  [name]

## 2019-05-17 NOTE — Telephone Encounter (Signed)
Patient notified per order of Dr. Marin Olp that "the myeloma is still improving!!  Great job!!"  Pt appreciative of call and has no questions or concerns at this time.

## 2019-05-19 DIAGNOSIS — R2681 Unsteadiness on feet: Secondary | ICD-10-CM | POA: Diagnosis not present

## 2019-05-19 DIAGNOSIS — I69398 Other sequelae of cerebral infarction: Secondary | ICD-10-CM | POA: Diagnosis not present

## 2019-05-19 DIAGNOSIS — M6281 Muscle weakness (generalized): Secondary | ICD-10-CM | POA: Diagnosis not present

## 2019-05-19 DIAGNOSIS — R262 Difficulty in walking, not elsewhere classified: Secondary | ICD-10-CM | POA: Diagnosis not present

## 2019-05-24 DIAGNOSIS — R2681 Unsteadiness on feet: Secondary | ICD-10-CM | POA: Diagnosis not present

## 2019-05-24 DIAGNOSIS — M6281 Muscle weakness (generalized): Secondary | ICD-10-CM | POA: Diagnosis not present

## 2019-05-24 DIAGNOSIS — I69398 Other sequelae of cerebral infarction: Secondary | ICD-10-CM | POA: Diagnosis not present

## 2019-05-24 DIAGNOSIS — R262 Difficulty in walking, not elsewhere classified: Secondary | ICD-10-CM | POA: Diagnosis not present

## 2019-05-25 ENCOUNTER — Inpatient Hospital Stay: Payer: Medicare HMO | Admitting: Adult Health

## 2019-05-25 ENCOUNTER — Telehealth: Payer: Self-pay

## 2019-05-25 NOTE — Telephone Encounter (Signed)
Opened in error

## 2019-05-26 ENCOUNTER — Ambulatory Visit (INDEPENDENT_AMBULATORY_CARE_PROVIDER_SITE_OTHER): Payer: Medicare HMO | Admitting: *Deleted

## 2019-05-26 ENCOUNTER — Inpatient Hospital Stay: Payer: Self-pay | Admitting: Adult Health

## 2019-05-26 DIAGNOSIS — I10 Essential (primary) hypertension: Secondary | ICD-10-CM | POA: Diagnosis not present

## 2019-05-26 DIAGNOSIS — I63412 Cerebral infarction due to embolism of left middle cerebral artery: Secondary | ICD-10-CM

## 2019-05-28 ENCOUNTER — Other Ambulatory Visit: Payer: Self-pay | Admitting: Hematology & Oncology

## 2019-05-28 DIAGNOSIS — C9002 Multiple myeloma in relapse: Secondary | ICD-10-CM

## 2019-05-28 DIAGNOSIS — R634 Abnormal weight loss: Secondary | ICD-10-CM

## 2019-05-28 DIAGNOSIS — R112 Nausea with vomiting, unspecified: Secondary | ICD-10-CM

## 2019-05-30 LAB — CUP PACEART REMOTE DEVICE CHECK
Date Time Interrogation Session: 20201018160946
Implantable Pulse Generator Implant Date: 20200909

## 2019-06-01 ENCOUNTER — Other Ambulatory Visit: Payer: Self-pay

## 2019-06-01 ENCOUNTER — Ambulatory Visit: Payer: Medicare HMO | Admitting: Neurology

## 2019-06-01 ENCOUNTER — Encounter: Payer: Self-pay | Admitting: Neurology

## 2019-06-01 VITALS — BP 157/81 | HR 75 | Temp 97.9°F | Ht 70.0 in | Wt 147.0 lb

## 2019-06-01 DIAGNOSIS — I639 Cerebral infarction, unspecified: Secondary | ICD-10-CM

## 2019-06-01 DIAGNOSIS — R4701 Aphasia: Secondary | ICD-10-CM

## 2019-06-01 NOTE — Patient Instructions (Signed)
I had a long d/w patient about his recent cryptogenic stroke, risk for recurrent stroke/TIAs, personally independently reviewed imaging studies and stroke evaluation results and answered questions.Continue aspirin 81 mg daily  but stop Plavix  now for secondary stroke prevention and maintain strict control of hypertension with blood pressure goal below 130/90, diabetes with hemoglobin A1c goal below 6.5% and lipids with LDL cholesterol goal below 70 mg/dL. I also advised the patient to eat a healthy diet with plenty of whole grains, cereals, fruits and vegetables, exercise regularly and maintain ideal body weight.  He will follow up with Dr. Marin Olp for his multiple myeloma treatment.  Followup in the future with my nurse practitioner Janett Billow in 3 months or call earlier if necessary.  Stroke Prevention Some medical conditions and behaviors are associated with a higher chance of having a stroke. You can help prevent a stroke by making nutrition, lifestyle, and other changes, including managing any medical conditions you may have. What nutrition changes can be made?   Eat healthy foods. You can do this by: ? Choosing foods high in fiber, such as fresh fruits and vegetables and whole grains. ? Eating at least 5 or more servings of fruits and vegetables a day. Try to fill half of your plate at each meal with fruits and vegetables. ? Choosing lean protein foods, such as lean cuts of meat, poultry without skin, fish, tofu, beans, and nuts. ? Eating low-fat dairy products. ? Avoiding foods that are high in salt (sodium). This can help lower blood pressure. ? Avoiding foods that have saturated fat, trans fat, and cholesterol. This can help prevent high cholesterol. ? Avoiding processed and premade foods.  Follow your health care provider's specific guidelines for losing weight, controlling high blood pressure (hypertension), lowering high cholesterol, and managing diabetes. These may include: ? Reducing  your daily calorie intake. ? Limiting your daily sodium intake to 1,500 milligrams (mg). ? Using only healthy fats for cooking, such as olive oil, canola oil, or sunflower oil. ? Counting your daily carbohydrate intake. What lifestyle changes can be made?  Maintain a healthy weight. Talk to your health care provider about your ideal weight.  Get at least 30 minutes of moderate physical activity at least 5 days a week. Moderate activity includes brisk walking, biking, and swimming.  Do not use any products that contain nicotine or tobacco, such as cigarettes and e-cigarettes. If you need help quitting, ask your health care provider. It may also be helpful to avoid exposure to secondhand smoke.  Limit alcohol intake to no more than 1 drink a day for nonpregnant women and 2 drinks a day for men. One drink equals 12 oz of beer, 5 oz of wine, or 1 oz of hard liquor.  Stop any illegal drug use.  Avoid taking birth control pills. Talk to your health care provider about the risks of taking birth control pills if: ? You are over 42 years old. ? You smoke. ? You get migraines. ? You have ever had a blood clot. What other changes can be made?  Manage your cholesterol levels. ? Eating a healthy diet is important for preventing high cholesterol. If cholesterol cannot be managed through diet alone, you may also need to take medicines. ? Take any prescribed medicines to control your cholesterol as told by your health care provider.  Manage your diabetes. ? Eating a healthy diet and exercising regularly are important parts of managing your blood sugar. If your blood sugar cannot be managed  through diet and exercise, you may need to take medicines. ? Take any prescribed medicines to control your diabetes as told by your health care provider.  Control your hypertension. ? To reduce your risk of stroke, try to keep your blood pressure below 130/80. ? Eating a healthy diet and exercising regularly are  an important part of controlling your blood pressure. If your blood pressure cannot be managed through diet and exercise, you may need to take medicines. ? Take any prescribed medicines to control hypertension as told by your health care provider. ? Ask your health care provider if you should monitor your blood pressure at home. ? Have your blood pressure checked every year, even if your blood pressure is normal. Blood pressure increases with age and some medical conditions.  Get evaluated for sleep disorders (sleep apnea). Talk to your health care provider about getting a sleep evaluation if you snore a lot or have excessive sleepiness.  Take over-the-counter and prescription medicines only as told by your health care provider. Aspirin or blood thinners (antiplatelets or anticoagulants) may be recommended to reduce your risk of forming blood clots that can lead to stroke.  Make sure that any other medical conditions you have, such as atrial fibrillation or atherosclerosis, are managed. What are the warning signs of a stroke? The warning signs of a stroke can be easily remembered as BEFAST.  B is for balance. Signs include: ? Dizziness. ? Loss of balance or coordination. ? Sudden trouble walking.  E is for eyes. Signs include: ? A sudden change in vision. ? Trouble seeing.  F is for face. Signs include: ? Sudden weakness or numbness of the face. ? The face or eyelid drooping to one side.  A is for arms. Signs include: ? Sudden weakness or numbness of the arm, usually on one side of the body.  S is for speech. Signs include: ? Trouble speaking (aphasia). ? Trouble understanding.  T is for time. ? These symptoms may represent a serious problem that is an emergency. Do not wait to see if the symptoms will go away. Get medical help right away. Call your local emergency services (911 in the [place] Do not drive yourself to the hospital.  Other signs of stroke may include: ? A sudden,  severe headache with no known cause. ? Nausea or vomiting. ? Seizure. Where to find more information For more information, visit:  American Stroke Association: www.strokeassociation.org  National Stroke Association: www.stroke.org Summary  You can prevent a stroke by eating healthy, exercising, not smoking, limiting alcohol intake, and managing any medical conditions you may have.  Do not use any products that contain nicotine or tobacco, such as cigarettes and e-cigarettes. If you need help quitting, ask your health care provider. It may also be helpful to avoid exposure to secondhand smoke.  Remember BEFAST for warning signs of stroke. Get help right away if you or a loved one has any of these signs. This information is not intended to replace advice given to you by your health care provider. Make sure you discuss any questions you have with your health care provider. Document Released: 09/05/2004 Document Revised: 07/11/2017 Document Reviewed: 09/03/2016 Elsevier Patient Education  2020 [employer].

## 2019-06-01 NOTE — Progress Notes (Signed)
[facility] [address] [place]. [place] [address] [phone]       OFFICE CONSULT NOTE  [name] Date of Birth:  1930/01/29 Medical Record Number:  [id number]   Referring MD: Rosalin Hawking  Reason for Referral: Stroke  HPI: [name] is a 86 year old pleasant Caucasian male seen today for initial office consultation visit following hospital admission for stroke.  History is obtained from the patient, review of electronic medical records and I personally reviewed imaging films and PACS.  He is a 85 year old pleasant Caucasian male with past medical history of multiple myeloma status post radiation and currently on chemotherapy, history of hypertension, radiation therapy, chronic kidney disease.  He developed sudden onset of difficulty speaking as noted by his wife.  Patient states he wanted to speak with words did not come out.  EMS noticed right facial droop.  In route to the hospital his speech improved.  He had no prior history of strokes.  His NIH stroke scale was 3 on admission with modified Rankin at baseline of 1.  Patient was administered IV TPA uneventfully.  He was monitored in the neuro ICU closely with strict blood pressure control and did well.  CT scan of the head on admission showed age-indeterminate right internal capsule lacunar and subinsular infarct and changes of small vessel disease.  MRI scan subsequently confirmed a left frontal cortical tiny acute infarct.  MRI of the brain showed no large vessel stenosis or occlusion.  Carotid ultrasound showed no significant extracranial stenosis.  Patient had loop recorder inserted by Dr. Lovena Le on 04/21/2019 and so for paroxysmal A. fib has not yet been found.  LDL cholesterol was 71 mg percent.  Hemoglobin A1c was 5.7.  Patient was on no antithrombotics prior to admission and was started on dual antiplatelet therapy aspirin 81 and Plavix 75 mg daily which she is tolerating well but does complain of increased  bruising though no major bleeding.  He was discharged home with home physical occupational and speech therapy and return to assisted living facility where he lives with his wife.  Patient states is done well.  He has had no recurrent symptoms.  His blood pressure is usually better controlled though it is elevated in office today at 157/81.  Patient has no new complaints today.  He states he is almost back to his baseline.  He denies any prior history of strokes TIAs or significant neurological problems.  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   Hypertension    Inguinal hernia    Multiple myeloma [facility]) Sept 2010   Multiple myeloma in relapse ([place]) 04/05/2016   Radiation 09/26/15-10/20/15   lower thoracic spine, upper lumbar spine 30 gray   Stroke [facility])     Social History:  Social History   Socioeconomic History   Marital status: Married    Spouse name: Romie Minus   Number of children: Not on file   Years of education: Not on file   Highest education level: Not on file  Occupational History  Occupation: retired  Scientist, product/process development strain: Not hard at [employer] insecurity    Worry: Never true    Inability: Never true   Transportation needs    Medical: No    Non-medical: No  Tobacco Use   Smoking status: Never Smoker   Smokeless tobacco: Never Used   Tobacco comment: never used tobacco  Substance and Sexual Activity   Alcohol use: Yes    Alcohol/week: 4.0 standard drinks     Types: 4 Glasses of wine per week   Drug use: No   Sexual activity: Not on file  Lifestyle   Physical activity    Days per week: Not on file    Minutes per session: Not on file   Stress: Not on file  Relationships   Social connections    Talks on phone: More than three times a week    Gets together: More than three times a week    Attends religious service: Not on file    Active member of club or organization: Not on file    Attends meetings of clubs or organizations: Not on file    Relationship status: Married   Intimate partner violence    Fear of current or ex partner: Not on file    Emotionally abused: Not on file    Physically abused: Not on file    Forced sexual activity: Not on file  Other Topics Concern   Not on file  Social History Narrative   Mr Burns is retired and lives at [employer] at [employer] retirement apartment with his wife, Romie Minus. They have assist from their son, [name] and grandson, [name]. He is independent/assist with his care needs His son and/or grandson assists with transportation to medical appointments    Medications:   Current Outpatient Medications on File Prior to Visit  Medication Sig Dispense Refill   amLODipine-benazepril (LOTREL) 5-10 MG capsule TAKE ONE CAPSULE EACH DAY 30 capsule 0   aspirin EC 81 MG EC tablet Take 1 tablet (81 mg total) by mouth daily.     dextromethorphan-guaiFENesin (MUCINEX DM) 30-600 MG 12hr tablet Take 1 tablet by mouth 2 (two) times daily as needed for cough.     dronabinol (MARINOL) 5 MG capsule TAKE ONE CAPSULE TWICE DAILY BEFORE MEALS 60 capsule 0   finasteride (PROSCAR) 5 MG tablet TAKE ONE TABLET EACH DAY 30 tablet 6   lidocaine-prilocaine (EMLA) cream Apply to affected area once 30 g 3   LORazepam (ATIVAN) 0.5 MG tablet Take 1 tablet (0.5 mg total) by mouth every 6 (six) hours as needed (Nausea or vomiting). 30 tablet 0   megestrol (MEGACE) 400 MG/10ML suspension Take 15 mLs (600 mg total) by mouth  daily. 480 mL 5   Multiple Vitamin (MULTIVITAMIN WITH MINERALS) TABS tablet Take 1 tablet by mouth daily.     ondansetron (ZOFRAN) 8 MG tablet Take 1 tablet (8 mg total) by mouth 2 (two) times daily as needed for refractory nausea / vomiting. Start on day 2 after Bendamustine chemotherapy. 30 tablet 1   penicillin v potassium (VEETID) 500 MG tablet Take 1 tablet (500 mg total) by mouth 4 (four) times daily. #120 120 tablet 11   polyvinyl alcohol (LIQUIFILM TEARS) 1.4 % ophthalmic solution Place 1 drop into both eyes as needed for dry eyes. 15 mL 0   Probiotic Product (PROBIOTIC PO) Take 1 capsule by mouth daily.     prochlorperazine (COMPAZINE) 10 MG tablet Take 1  tablet (10 mg total) by mouth every 6 (six) hours as needed (nausea and vomiting). 30 tablet 2   pyridOXINE (VITAMIN B-6) 100 MG tablet Take 100 mg by mouth daily.      traMADol (ULTRAM) 50 MG tablet Take 1 tablet (50 mg total) by mouth every 8 (eight) hours as needed for moderate pain.     vitamin B-12 (CYANOCOBALAMIN) 500 MCG tablet Take 500 mcg by mouth daily.     Vitamin D, Ergocalciferol, (DRISDOL) 50000 UNITS CAPS capsule Take 50,000 Units by mouth every Sunday.      No current facility-administered medications on file prior to visit.     Allergies:   Allergies  Allergen Reactions   Sulfa Antibiotics Itching   Sulfasalazine Itching    Physical Exam General: Frail elderly Caucasian male seated, in no evident distress Head: head normocephalic and atraumatic.   Neck: supple with no carotid or supraclavicular bruits Cardiovascular: regular rate and rhythm, no murmurs Musculoskeletal: no deformity Skin:  no ras but scattered petichiae Vascular:  Normal pulses all extremities  Neurologic Exam Mental Status: Awake and fully alert. Oriented to place and time. Recent and remote memory intact. Attention span, concentration and fund of knowledge appropriate. Mood and affect appropriate.  Cranial Nerves: Fundoscopic  exam reveals sharp disc margins. Pupils equal, briskly reactive to light. Extraocular movements full without nystagmus. Visual fields full to confrontation. Hearing diminished in the right ear t. Facial sensation intact. Face, tongue, palate moves normally and symmetrically.  Motor: Normal bulk and tone. Normal strength in all tested extremity muscles. Sensory.: intact to touch , pinprick , position and vibratory sensation.  Coordination: Rapid alternating movements normal in all extremities. Finger-to-nose and heel-to-shin performed accurately bilaterally. Gait and Station: Arises from chair without difficulty. Stance is normal. Gait demonstrates slight favoring of the right leg but normal stride length and balance . Able to heel, toe and tandem walk with moderate difficulty.  Reflexes: 1+ and symmetric. Toes downgoing.   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  Vascular risk factors of age and borderline hyperlipidemia only     PLAN: I had a long d/w patient about his recent cryptogenic stroke, risk for recurrent stroke/TIAs, personally independently reviewed imaging studies and stroke evaluation results and answered questions.Continue aspirin 81 mg daily  but stop Plavix  now for secondary stroke prevention and maintain strict control of hypertension with blood pressure goal below 130/90, diabetes with hemoglobin A1c goal below 6.5% and lipids with LDL cholesterol goal below 70 mg/dL. I also advised the patient to eat a healthy diet with plenty of whole grains, cereals, fruits and vegetables, exercise regularly and maintain ideal body weight.  He will follow up with Dr. Marin Olp for his multiple myeloma treatment.  Greater than 50% time during this 45-minute consultation visit was spent on counseling and coordination of  care about his cryptogenic stroke discussion about evaluation, prevention and treatment and answering questions followup in the future with my nurse practitioner Janett Billow in 3 months or call earlier if necessary. Antony Contras, MD  [facility] [address] [place], [place] [address]  Phone [phone] Fax [phone]  Note: This document was prepared with digital dictation and possible smart phrase technology. Any transcriptional errors that result from this process are unintentional.

## 2019-06-03 ENCOUNTER — Telehealth: Payer: Self-pay | Admitting: *Deleted

## 2019-06-03 NOTE — Telephone Encounter (Signed)
Patient called stating that Dr. Leonie Man at [facility] suggested he stop the Plavix and continue on 81 mg of Aspirin.  Told Christian Burns that  Dr Marin Olp is out of the office this week and would be back on Monday. We will let him know after we talk to Dr. Marin Olp

## 2019-06-07 ENCOUNTER — Telehealth: Payer: Self-pay | Admitting: *Deleted

## 2019-06-07 NOTE — Telephone Encounter (Signed)
Unable to reach pt.  Called pt Grandson Christian Burns advised Dr. Jonette Eva is in agreement with pt taking ASA as recommended by neurology.

## 2019-06-09 NOTE — Progress Notes (Addendum)
[facility] Summary Report / Loop Recorder 

## 2019-06-10 ENCOUNTER — Inpatient Hospital Stay (HOSPITAL_BASED_OUTPATIENT_CLINIC_OR_DEPARTMENT_OTHER): Payer: Medicare HMO | Admitting: Family

## 2019-06-10 ENCOUNTER — Inpatient Hospital Stay: Payer: Medicare HMO

## 2019-06-10 ENCOUNTER — Other Ambulatory Visit: Payer: Self-pay

## 2019-06-10 VITALS — BP 150/74 | HR 74 | Temp 97.3°F | Resp 18 | Wt 147.4 lb

## 2019-06-10 DIAGNOSIS — D649 Anemia, unspecified: Secondary | ICD-10-CM

## 2019-06-10 DIAGNOSIS — D508 Other iron deficiency anemias: Secondary | ICD-10-CM

## 2019-06-10 DIAGNOSIS — D6481 Anemia due to antineoplastic chemotherapy: Secondary | ICD-10-CM | POA: Diagnosis not present

## 2019-06-10 DIAGNOSIS — C9002 Multiple myeloma in relapse: Secondary | ICD-10-CM | POA: Diagnosis not present

## 2019-06-10 DIAGNOSIS — R5383 Other fatigue: Secondary | ICD-10-CM | POA: Diagnosis not present

## 2019-06-10 DIAGNOSIS — G629 Polyneuropathy, unspecified: Secondary | ICD-10-CM | POA: Diagnosis not present

## 2019-06-10 DIAGNOSIS — C9000 Multiple myeloma not having achieved remission: Secondary | ICD-10-CM

## 2019-06-10 DIAGNOSIS — N289 Disorder of kidney and ureter, unspecified: Secondary | ICD-10-CM | POA: Diagnosis not present

## 2019-06-10 DIAGNOSIS — Z882 Allergy status to sulfonamides status: Secondary | ICD-10-CM | POA: Diagnosis not present

## 2019-06-10 DIAGNOSIS — Z5111 Encounter for antineoplastic chemotherapy: Secondary | ICD-10-CM | POA: Diagnosis not present

## 2019-06-10 DIAGNOSIS — Z7902 Long term (current) use of antithrombotics/antiplatelets: Secondary | ICD-10-CM | POA: Diagnosis not present

## 2019-06-10 DIAGNOSIS — Z79899 Other long term (current) drug therapy: Secondary | ICD-10-CM | POA: Diagnosis not present

## 2019-06-10 LAB — CMP (CANCER CENTER ONLY)
ALT: 13 U/L (ref 0–44)
AST: 25 U/L (ref 15–41)
Albumin: 3.4 g/dL — ABNORMAL LOW (ref 3.5–5.0)
Alkaline Phosphatase: 71 U/L (ref 38–126)
Anion gap: 6 (ref 5–15)
BUN: 29 mg/dL — ABNORMAL HIGH (ref 8–23)
CO2: 24 mmol/L (ref 22–32)
Calcium: 8.8 mg/dL — ABNORMAL LOW (ref 8.9–10.3)
Chloride: 107 mmol/L (ref 98–111)
Creatinine: 1.57 mg/dL — ABNORMAL HIGH (ref 0.61–1.24)
GFR, Est AFR Am: 45 mL/min — ABNORMAL LOW (ref >60)
GFR, Estimated: 39 mL/min — ABNORMAL LOW (ref >60)
Glucose, Bld: 99 mg/dL (ref 70–99)
Potassium: 4.3 mmol/L (ref 3.5–5.1)
Sodium: 137 mmol/L (ref 135–145)
Total Bilirubin: 0.7 mg/dL (ref 0.3–1.2)
Total Protein: 6 g/dL — ABNORMAL LOW (ref 6.5–8.1)

## 2019-06-10 LAB — CBC WITH DIFFERENTIAL (CANCER CENTER ONLY)
Abs Immature Granulocytes: 0.24 10*3/uL — ABNORMAL HIGH (ref 0.00–0.07)
Basophils Absolute: 0.1 10*3/uL (ref 0.0–0.1)
Basophils Relative: 2 %
Eosinophils Absolute: 0.4 10*3/uL (ref 0.0–0.5)
Eosinophils Relative: 9 %
HCT: 30.7 % — ABNORMAL LOW (ref 39.0–52.0)
Hemoglobin: 10 g/dL — ABNORMAL LOW (ref 13.0–17.0)
Immature Granulocytes: 6 %
Lymphocytes Relative: 7 %
Lymphs Abs: 0.3 10*3/uL — ABNORMAL LOW (ref 0.7–4.0)
MCH: 31.1 pg (ref 26.0–34.0)
MCHC: 32.6 g/dL (ref 30.0–36.0)
MCV: 95.3 fL (ref 80.0–100.0)
Monocytes Absolute: 0.8 10*3/uL (ref 0.1–1.0)
Monocytes Relative: 20 %
Neutro Abs: 2.4 10*3/uL (ref 1.7–7.7)
Neutrophils Relative %: 56 %
Platelet Count: 138 10*3/uL — ABNORMAL LOW (ref 150–400)
RBC: 3.22 MIL/uL — ABNORMAL LOW (ref 4.22–5.81)
RDW: 15.9 % — ABNORMAL HIGH (ref 11.5–15.5)
WBC Count: 4.2 10*3/uL (ref 4.0–10.5)
nRBC: 0 % (ref 0.0–0.2)

## 2019-06-10 MED ORDER — DEXAMETHASONE SODIUM PHOSPHATE 10 MG/ML IJ SOLN
INTRAMUSCULAR | Status: AC
  Filled 2019-06-10: qty 1

## 2019-06-10 MED ORDER — SODIUM CHLORIDE 0.9 % IV SOLN
Freq: Once | INTRAVENOUS | Status: AC
  Administered 2019-06-10: 13:00:00 via INTRAVENOUS
  Filled 2019-06-10: qty 250

## 2019-06-10 MED ORDER — PALONOSETRON HCL INJECTION 0.25 MG/5ML
0.2500 mg | Freq: Once | INTRAVENOUS | Status: AC
  Administered 2019-06-10: 0.25 mg via INTRAVENOUS

## 2019-06-10 MED ORDER — DEXAMETHASONE SODIUM PHOSPHATE 10 MG/ML IJ SOLN
10.0000 mg | Freq: Once | INTRAMUSCULAR | Status: AC
  Administered 2019-06-10: 10 mg via INTRAVENOUS

## 2019-06-10 MED ORDER — PALONOSETRON HCL INJECTION 0.25 MG/5ML
INTRAVENOUS | Status: AC
  Filled 2019-06-10: qty 5

## 2019-06-10 MED ORDER — SODIUM CHLORIDE 0.9 % IV SOLN
75.0000 mg/m2 | Freq: Once | INTRAVENOUS | Status: AC
  Administered 2019-06-10: 125 mg via INTRAVENOUS
  Filled 2019-06-10: qty 5

## 2019-06-10 NOTE — Progress Notes (Signed)
Okay to treat with SCr = 1.57 per Dr. Marin Olp

## 2019-06-10 NOTE — Patient Instructions (Signed)
Bendamustine Injection What is this medicine? BENDAMUSTINE (BEN da MUS teen) is a chemotherapy drug. It is used to treat chronic lymphocytic leukemia and non-Hodgkin lymphoma. This medicine may be used for other purposes; ask your health care provider or pharmacist if you have questions. COMMON BRAND NAME(S): BELRAPZO, BENDEKA, Treanda What should I tell my health care provider before I take this medicine? They need to know if you have any of these conditions:  infection (especially a virus infection such as chickenpox, cold sores, or herpes)  kidney disease  liver disease  an unusual or allergic reaction to bendamustine, mannitol, other medicines, foods, dyes, or preservatives  pregnant or trying to get pregnant  breast-feeding How should I use this medicine? This medicine is for infusion into a vein. It is given by a health care professional in a hospital or clinic setting. Talk to your pediatrician regarding the use of this medicine in children. Special care may be needed. Overdosage: If you think you have taken too much of this medicine contact a poison control center or emergency room at once. NOTE: This medicine is only for you. Do not share this medicine with others. What if I miss a dose? It is important not to miss your dose. Call your doctor or health care professional if you are unable to keep an appointment. What may interact with this medicine? Do not take this medicine with any of the following medications:  clozapine This medicine may also interact with the following medications:  atazanavir  cimetidine  ciprofloxacin  enoxacin  fluvoxamine  medicines for seizures like carbamazepine and phenobarbital  mexiletine  rifampin  tacrine  thiabendazole  zileuton This list may not describe all possible interactions. Give your health care provider a list of all the medicines, herbs, non-prescription drugs, or dietary supplements you use. Also tell them if  you smoke, drink alcohol, or use illegal drugs. Some items may interact with your medicine. What should I watch for while using this medicine? This drug may make you feel generally unwell. This is not uncommon, as chemotherapy can affect healthy cells as well as cancer cells. Report any side effects. Continue your course of treatment even though you feel ill unless your doctor tells you to stop. You may need blood work done while you are taking this medicine. Call your doctor or healthcare provider for advice if you get a fever, chills or sore throat, or other symptoms of a cold or flu. Do not treat yourself. This drug decreases your body's ability to fight infections. Try to avoid being around people who are sick. This medicine may cause serious skin reactions. They can happen weeks to months after starting the medicine. Contact your healthcare provider right away if you notice fevers or flu-like symptoms with a rash. The rash may be red or purple and then turn into blisters or peeling of the skin. Or, you might notice a red rash with swelling of the face, lips or lymph nodes in your neck or under your arms. This medicine may increase your risk to bruise or bleed. Call your doctor or healthcare provider if you notice any unusual bleeding. Talk to your doctor about your risk of cancer. You may be more at risk for certain types of cancers if you take this medicine. Do not become pregnant while taking this medicine or for at least 6 months after stopping it. Women should inform their doctor if they wish to become pregnant or think they might be pregnant. Men should not   father a child while taking this medicine and for at least 3 months after stopping it. There is a potential for serious side effects to an unborn child. Talk to your healthcare provider or pharmacist for more information. Do not breast-feed an infant while taking this medicine or for at least 1 week after stopping it. This medicine may make it  more difficult to father a child. You should talk with your doctor or healthcare provider if you are concerned about your fertility. What side effects may I notice from receiving this medicine? Side effects that you should report to your doctor or health care professional as soon as possible:  allergic reactions like skin rash, itching or hives, swelling of the face, lips, or tongue  low blood counts - this medicine may decrease the number of white blood cells, red blood cells and platelets. You may be at increased risk for infections and bleeding.  rash, fever, and swollen lymph nodes  redness, blistering, peeling, or loosening of the skin, including inside the mouth  signs of infection like fever or chills, cough, sore throat, pain or difficulty passing urine  signs of decreased platelets or bleeding like bruising, pinpoint red spots on the skin, black, tarry stools, blood in the urine  signs of decreased red blood cells like being unusually weak or tired, fainting spells, lightheadedness  signs and symptoms of kidney injury like trouble passing urine or change in the amount of urine  signs and symptoms of liver injury like dark yellow or brown urine; general ill feeling or flu-like symptoms; light-colored stools; loss of appetite; nausea; right upper belly pain; unusually weak or tired; yellowing of the eyes or skin Side effects that usually do not require medical attention (report to your doctor or health care professional if they continue or are bothersome):  constipation  decreased appetite  diarrhea  headache  mouth sores  nausea, vomiting  tiredness This list may not describe all possible side effects. Call your doctor for medical advice about side effects. You may report side effects to FDA at 1-800-FDA-1088. Where should I keep my medicine? This drug is given in a hospital or clinic and will not be stored at home. NOTE: This sheet is a summary. It may not cover all  possible information. If you have questions about this medicine, talk to your doctor, pharmacist, or health care provider.  2020 [employer]vier/Gold Standard (2018-10-20 10:26:46)  

## 2019-06-10 NOTE — Progress Notes (Signed)
Hematology and Oncology Follow Up Visit  Christian Burns [id number] 07-Aug-1930 85 y.o. 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 Palliative radiation therapy to right ilium Kyprolis/Cytoxanq 3 week dosing- s/p cycle 24 (Cytoxan restarted on cycle 13) - DC'd due to progression Radiation therapy to right femur, 10 fraction --completed 3000 rad on 11/06/2018 Elotuzomab - started 10/29/2018 -- s/p cycle #1 -- d/c on 12/24/2018 Pomalidomide 2 mg po q day (21 on/7 off) - started 10/29/2018 -- d/c 12/31/2018 Daratumumab - start cycle # 1 on 12/31/2018 -- d/c on -02/04/2019  Current Therapy:   Bendamustine/Decadron -- started on 02/10/2019, s/p cycle 4 Zometa IV q 4 weeks - on hold Aranesp 300 g subcutaneous as needed for hemoglobin less than 10   Interim History:  Christian Burns is here today for follow-up and treatment. He is doing well but still has some fatigue since his hospitalization for TIA. He completed PT and is only using a walker today because it is windy outside.  Earlier this month, his M-spike was down to 0.9, IgG level 1,297 mg/dL and lambda light chains 23.91 mg/dL.  His Hgb is much better since receiving blood, 10.0 up from 7.3.  Creatinine slowly improving at 1.57 and BUN 29.  He is resting well at night.  No fever, cough, rash, dizziness, SOB, chest pain, palpitations, abdominal pain or changes in bowel or bladder habits.  No swelling or tenderness in his extremities. The numbness in his fingers and feet is stable and unchanged. He denies any falls or balance issues. No syncope.  He still has issues with his appetite being down. He notes that since his TIA his food migrates to the left cheek when  chewing and he has to drink milk to help move it and swallow. He has had no issues with choking. He states that Dr. Leonie Man is aware and no intervention is needed at this time.  He will try taking the Marinol 30 minutes before he eats his 2 biggest meals of the day instead of with his meals and see if this helps increase his appetite. He has occasional nausea, no vomiting, and takes an ativan if needed which he states is effective.   He drinks a lot of milk daily and has noted he has a lot of phlegm. He really does not like water.  His weight remains stable at 147 lbs.   ECOG Performance Status: 1 - Symptomatic but completely ambulatory  Medications:  Allergies as of 06/10/2019      Reactions   Sulfa Antibiotics Itching   Sulfasalazine Itching      Medication List       Accurate as of June 10, 2019 12:21 PM. If you have any questions, ask your nurse or doctor.        amLODipine-benazepril 5-10 MG capsule Commonly known as: LOTREL TAKE ONE CAPSULE EACH DAY   aspirin 81 MG EC tablet Take 1 tablet (81 mg total) by mouth daily.   dextromethorphan-guaiFENesin 30-600 MG 12hr tablet Commonly known as: MUCINEX DM Take 1 tablet by mouth 2 (two) times daily as needed for cough.   dronabinol 5 MG capsule Commonly known as: MARINOL TAKE ONE CAPSULE TWICE DAILY BEFORE MEALS   finasteride 5 MG tablet Commonly known  as: PROSCAR TAKE ONE TABLET EACH DAY   lidocaine-prilocaine cream Commonly known as: EMLA Apply to affected area once   LORazepam 0.5 MG tablet Commonly known as: Ativan Take 1 tablet (0.5 mg total) by mouth every 6 (six) hours as needed (Nausea or vomiting).   megestrol 400 MG/10ML suspension Commonly known as: MEGACE Take 15 mLs (600 mg total) by mouth daily.   multivitamin with minerals Tabs tablet Take 1 tablet by mouth daily.   ondansetron 8 MG tablet Commonly known as: Zofran Take 1 tablet (8 mg total) by mouth 2 (two) times daily as needed for refractory  nausea / vomiting. Start on day 2 after Bendamustine chemotherapy.   penicillin v potassium 500 MG tablet Commonly known as: VEETID Take 1 tablet (500 mg total) by mouth 4 (four) times daily. #120   polyvinyl alcohol 1.4 % ophthalmic solution Commonly known as: LIQUIFILM TEARS Place 1 drop into both eyes as needed for dry eyes.   PROBIOTIC PO Take 1 capsule by mouth daily.   prochlorperazine 10 MG tablet Commonly known as: COMPAZINE Take 1 tablet (10 mg total) by mouth every 6 (six) hours as needed (nausea and vomiting).   pyridOXINE 100 MG tablet Commonly known as: VITAMIN B-6 Take 100 mg by mouth daily.   traMADol 50 MG tablet Commonly known as: ULTRAM Take 1 tablet (50 mg total) by mouth every 8 (eight) hours as needed for moderate pain.   vitamin B-12 500 MCG tablet Commonly known as: CYANOCOBALAMIN Take 500 mcg by mouth daily.   Vitamin D (Ergocalciferol) 1.25 MG (50000 UT) Caps capsule Commonly known as: DRISDOL Take 50,000 Units by mouth every Sunday.       Allergies:  Allergies  Allergen Reactions  . Sulfa Antibiotics Itching  . Sulfasalazine Itching    Past Medical History, Surgical history, Social history, and Family History were reviewed and updated.  Review of Systems: All other 10 point review of systems is negative.   Physical Exam:  weight is 147 lb 6.4 oz (66.9 kg). His temperature is 97.3 F (36.3 C) (abnormal). His blood pressure is 150/74 (abnormal) and his pulse is 74. His respiration is 18 and oxygen saturation is 100%.   Wt Readings from Last 3 Encounters:  06/10/19 147 lb 6.4 oz (66.9 kg)  06/01/19 147 lb (66.7 kg)  05/13/19 146 lb (66.2 kg)    Ocular: Sclerae unicteric, pupils equal, round and reactive to light Ear-nose-throat: Oropharynx clear, dentition fair Lymphatic: No cervical or supraclavicular adenopathy Lungs no rales or rhonchi, good excursion bilaterally Heart regular rate and rhythm, no murmur appreciated Abd soft,  nontender, positive bowel sounds, no liver or spleen tip palpated on exam, no fluid wave  MSK no focal spinal tenderness, no joint edema Neuro: non-focal, well-oriented, appropriate affect Breasts: Deferred   Lab Results  Component Value Date   WBC 4.2 06/10/2019   HGB 10.0 (L) 06/10/2019   HCT 30.7 (L) 06/10/2019   MCV 95.3 06/10/2019   PLT 138 (L) 06/10/2019   Lab Results  Component Value Date   FERRITIN 125 12/10/2018   IRON 40 (L) 12/10/2018   TIBC 220 12/10/2018   UIBC 180 12/10/2018   IRONPCTSAT 18 (L) 12/10/2018   Lab Results  Component Value Date   RETICCTPCT 1.3 12/10/2018   RBC 3.22 (L) 06/10/2019   Lab Results  Component Value Date   KPAFRELGTCHN 1.9 (L) 05/13/2019   LAMBDASER 239.1 (H) 05/13/2019   KAPLAMBRATIO 0.01 (L) 05/13/2019   Lab Results  Component Value Date   IGGSERUM 1,297 05/13/2019   IGA 7 (L) 05/13/2019   IGMSERUM <5 (L) 05/13/2019   Lab Results  Component Value Date   TOTALPROTELP 5.9 (L) 05/13/2019   ALBUMINELP 3.0 05/13/2019   A1GS 0.3 05/13/2019   A2GS 0.9 05/13/2019   BETS 0.7 05/13/2019   BETA2SER 0.3 07/28/2015   GAMS 1.0 05/13/2019   MSPIKE 0.9 (H) 05/13/2019   SPEI Comment 05/13/2019     Chemistry      Component Value Date/Time   NA 137 06/10/2019 1137   NA 146 (H) 08/07/2017 1153   NA 141 01/09/2017 1004   K 4.3 06/10/2019 1137   K 4.6 08/07/2017 1153   K 4.4 01/09/2017 1004   CL 107 06/10/2019 1137   CL 108 08/07/2017 1153   CO2 24 06/10/2019 1137   CO2 26 08/07/2017 1153   CO2 22 01/09/2017 1004   BUN 29 (H) 06/10/2019 1137   BUN 24 (H) 08/07/2017 1153   BUN 23.9 01/09/2017 1004   CREATININE 1.57 (H) 06/10/2019 1137   CREATININE 1.56 (H) 05/26/2018 1508   CREATININE 1.5 (H) 01/09/2017 1004      Component Value Date/Time   CALCIUM 8.8 (L) 06/10/2019 1137   CALCIUM 8.8 08/07/2017 1153   CALCIUM 8.8 01/09/2017 1004   ALKPHOS 71 06/10/2019 1137   ALKPHOS 97 (H) 08/07/2017 1153   ALKPHOS 80 01/09/2017 1004    AST 25 06/10/2019 1137   AST 18 01/09/2017 1004   ALT 13 06/10/2019 1137   ALT 20 08/07/2017 1153   ALT 12 01/09/2017 1004   BILITOT 0.7 06/10/2019 1137   BILITOT 0.92 01/09/2017 1004       Impression and Plan: Mr. Ikner is a very pleasant 85 yo caucasian gentleman with relapsed IgG lambda myeloma.   He continues to have a nice response to treatment. His counts are looking much better.  We will proceed with treatment today as planned and see him back in another month.  Both he and his family know to contact our office with any questions or concerns. We can certainly see him sooner if needed.  Greater than 50% of his 30 minute visit was spent counseling and coordinating care.   Laverna Peace, NP 10/29/202012:21 PM

## 2019-06-11 ENCOUNTER — Telehealth: Payer: Self-pay | Admitting: *Deleted

## 2019-06-11 ENCOUNTER — Inpatient Hospital Stay: Payer: Medicare HMO

## 2019-06-11 VITALS — BP 149/80 | HR 77 | Temp 97.1°F | Resp 16

## 2019-06-11 DIAGNOSIS — G629 Polyneuropathy, unspecified: Secondary | ICD-10-CM | POA: Diagnosis not present

## 2019-06-11 DIAGNOSIS — C9002 Multiple myeloma in relapse: Secondary | ICD-10-CM | POA: Diagnosis not present

## 2019-06-11 DIAGNOSIS — R5383 Other fatigue: Secondary | ICD-10-CM | POA: Diagnosis not present

## 2019-06-11 DIAGNOSIS — Z7902 Long term (current) use of antithrombotics/antiplatelets: Secondary | ICD-10-CM | POA: Diagnosis not present

## 2019-06-11 DIAGNOSIS — Z79899 Other long term (current) drug therapy: Secondary | ICD-10-CM | POA: Diagnosis not present

## 2019-06-11 DIAGNOSIS — C9000 Multiple myeloma not having achieved remission: Secondary | ICD-10-CM

## 2019-06-11 DIAGNOSIS — Z882 Allergy status to sulfonamides status: Secondary | ICD-10-CM | POA: Diagnosis not present

## 2019-06-11 DIAGNOSIS — N289 Disorder of kidney and ureter, unspecified: Secondary | ICD-10-CM | POA: Diagnosis not present

## 2019-06-11 DIAGNOSIS — D6481 Anemia due to antineoplastic chemotherapy: Secondary | ICD-10-CM | POA: Diagnosis not present

## 2019-06-11 DIAGNOSIS — Z5111 Encounter for antineoplastic chemotherapy: Secondary | ICD-10-CM | POA: Diagnosis not present

## 2019-06-11 LAB — KAPPA/LAMBDA LIGHT CHAINS
Kappa free light chain: 1.3 mg/L — ABNORMAL LOW (ref 3.3–19.4)
Kappa, lambda light chain ratio: 0.01 — ABNORMAL LOW (ref 0.26–1.65)
Lambda free light chains: 152 mg/L — ABNORMAL HIGH (ref 5.7–26.3)

## 2019-06-11 LAB — IGG, IGA, IGM
IgA: 5 mg/dL — ABNORMAL LOW (ref 61–437)
IgG (Immunoglobin G), Serum: 1073 mg/dL (ref 603–1613)
IgM (Immunoglobulin M), Srm: 5 mg/dL — ABNORMAL LOW (ref 15–143)

## 2019-06-11 MED ORDER — DEXAMETHASONE SODIUM PHOSPHATE 10 MG/ML IJ SOLN
INTRAMUSCULAR | Status: AC
  Filled 2019-06-11: qty 1

## 2019-06-11 MED ORDER — SODIUM CHLORIDE 0.9 % IV SOLN
Freq: Once | INTRAVENOUS | Status: AC
  Administered 2019-06-11: 14:00:00 via INTRAVENOUS
  Filled 2019-06-11: qty 250

## 2019-06-11 MED ORDER — PALONOSETRON HCL INJECTION 0.25 MG/5ML
INTRAVENOUS | Status: AC
  Filled 2019-06-11: qty 5

## 2019-06-11 MED ORDER — DEXAMETHASONE SODIUM PHOSPHATE 10 MG/ML IJ SOLN
10.0000 mg | Freq: Once | INTRAMUSCULAR | Status: AC
  Administered 2019-06-11: 10 mg via INTRAVENOUS

## 2019-06-11 MED ORDER — SODIUM CHLORIDE 0.9 % IV SOLN
75.0000 mg/m2 | Freq: Once | INTRAVENOUS | Status: AC
  Administered 2019-06-11: 14:00:00 125 mg via INTRAVENOUS
  Filled 2019-06-11: qty 5

## 2019-06-11 NOTE — Telephone Encounter (Signed)
-----   Message from [name], MD sent at 06/11/2019 12:42 PM EDT ----- Call - the myeloma is still responding!!!  The lambda light chain is down to 152!!!!  In June , it was 32!!!  This is a miracle!!  Christian Burns

## 2019-06-11 NOTE — Telephone Encounter (Signed)
Pt notified in infusion room per order of Dr. Marin Olp that "the myeloma is still responding!!!  The lambda light chain is down to 152!!! In June, it was 19!!  This is a miracle!"  Pt appreciative of information and has no questions or concerns at this time.

## 2019-06-14 ENCOUNTER — Other Ambulatory Visit: Payer: Self-pay | Admitting: Family

## 2019-06-14 LAB — IMMUNOFIXATION REFLEX, SERUM
IgA: <5 mg/dL — ABNORMAL LOW (ref 61–437)
IgG (Immunoglobin G), Serum: 1073 mg/dL (ref 603–1613)
IgM (Immunoglobulin M), Srm: <5 mg/dL — ABNORMAL LOW (ref 15–143)

## 2019-06-14 LAB — PROTEIN ELECTROPHORESIS, SERUM, WITH REFLEX
A/G Ratio: 1.2 (ref 0.7–1.7)
Albumin ELP: 3 g/dL (ref 2.9–4.4)
Alpha-1-Globulin: 0.3 g/dL (ref 0.0–0.4)
Alpha-2-Globulin: 0.8 g/dL (ref 0.4–1.0)
Beta Globulin: 0.6 g/dL — ABNORMAL LOW (ref 0.7–1.3)
Gamma Globulin: 0.9 g/dL (ref 0.4–1.8)
Globulin, Total: 2.5 g/dL (ref 2.2–3.9)
M-Spike, %: 0.7 g/dL — ABNORMAL HIGH
SPEP Interpretation: 0
Total Protein ELP: 5.5 g/dL — ABNORMAL LOW (ref 6.0–8.5)

## 2019-06-15 ENCOUNTER — Other Ambulatory Visit: Payer: Self-pay | Admitting: Hematology & Oncology

## 2019-06-15 DIAGNOSIS — I10 Essential (primary) hypertension: Secondary | ICD-10-CM

## 2019-06-22 ENCOUNTER — Other Ambulatory Visit: Payer: Self-pay

## 2019-06-22 ENCOUNTER — Encounter (HOSPITAL_COMMUNITY): Payer: Self-pay

## 2019-06-22 ENCOUNTER — Telehealth: Payer: Self-pay | Admitting: *Deleted

## 2019-06-22 ENCOUNTER — Emergency Department (HOSPITAL_COMMUNITY): Payer: Medicare HMO

## 2019-06-22 ENCOUNTER — Other Ambulatory Visit: Payer: Self-pay | Admitting: *Deleted

## 2019-06-22 ENCOUNTER — Inpatient Hospital Stay (HOSPITAL_COMMUNITY)
Admission: EM | Admit: 2019-06-22 | Discharge: 2019-06-27 | DRG: 193 | Disposition: A | Discharge status: Home Health | Payer: Medicare HMO | Attending: Internal Medicine | Admitting: Internal Medicine

## 2019-06-22 DIAGNOSIS — R05 Cough: Secondary | ICD-10-CM | POA: Diagnosis not present

## 2019-06-22 DIAGNOSIS — E43 Unspecified severe protein-calorie malnutrition: Secondary | ICD-10-CM | POA: Insufficient documentation

## 2019-06-22 DIAGNOSIS — J189 Pneumonia, unspecified organism: Principal | ICD-10-CM | POA: Diagnosis present

## 2019-06-22 DIAGNOSIS — Y95 Nosocomial condition: Secondary | ICD-10-CM | POA: Diagnosis present

## 2019-06-22 DIAGNOSIS — R42 Dizziness and giddiness: Secondary | ICD-10-CM | POA: Diagnosis not present

## 2019-06-22 DIAGNOSIS — R110 Nausea: Secondary | ICD-10-CM | POA: Diagnosis present

## 2019-06-22 DIAGNOSIS — D72829 Elevated white blood cell count, unspecified: Secondary | ICD-10-CM | POA: Diagnosis not present

## 2019-06-22 DIAGNOSIS — E860 Dehydration: Secondary | ICD-10-CM | POA: Diagnosis present

## 2019-06-22 DIAGNOSIS — M1990 Unspecified osteoarthritis, unspecified site: Secondary | ICD-10-CM | POA: Diagnosis present

## 2019-06-22 DIAGNOSIS — Z7952 Long term (current) use of systemic steroids: Secondary | ICD-10-CM | POA: Diagnosis not present

## 2019-06-22 DIAGNOSIS — Z6822 Body mass index (BMI) 22.0-22.9, adult: Secondary | ICD-10-CM

## 2019-06-22 DIAGNOSIS — N183 Chronic kidney disease, stage 3 unspecified: Secondary | ICD-10-CM | POA: Diagnosis present

## 2019-06-22 DIAGNOSIS — R5383 Other fatigue: Secondary | ICD-10-CM | POA: Diagnosis not present

## 2019-06-22 DIAGNOSIS — Z882 Allergy status to sulfonamides status: Secondary | ICD-10-CM | POA: Diagnosis not present

## 2019-06-22 DIAGNOSIS — D899 Disorder involving the immune mechanism, unspecified: Secondary | ICD-10-CM | POA: Diagnosis present

## 2019-06-22 DIAGNOSIS — D508 Other iron deficiency anemias: Secondary | ICD-10-CM | POA: Diagnosis present

## 2019-06-22 DIAGNOSIS — Z96641 Presence of right artificial hip joint: Secondary | ICD-10-CM | POA: Diagnosis present

## 2019-06-22 DIAGNOSIS — Z20828 Contact with and (suspected) exposure to other viral communicable diseases: Secondary | ICD-10-CM | POA: Diagnosis present

## 2019-06-22 DIAGNOSIS — Z79899 Other long term (current) drug therapy: Secondary | ICD-10-CM | POA: Diagnosis not present

## 2019-06-22 DIAGNOSIS — Z8249 Family history of ischemic heart disease and other diseases of the circulatory system: Secondary | ICD-10-CM

## 2019-06-22 DIAGNOSIS — T451X5A Adverse effect of antineoplastic and immunosuppressive drugs, initial encounter: Secondary | ICD-10-CM | POA: Diagnosis present

## 2019-06-22 DIAGNOSIS — M272 Inflammatory conditions of jaws: Secondary | ICD-10-CM | POA: Diagnosis present

## 2019-06-22 DIAGNOSIS — C9000 Multiple myeloma not having achieved remission: Secondary | ICD-10-CM | POA: Diagnosis present

## 2019-06-22 DIAGNOSIS — R634 Abnormal weight loss: Secondary | ICD-10-CM | POA: Diagnosis not present

## 2019-06-22 DIAGNOSIS — I10 Essential (primary) hypertension: Secondary | ICD-10-CM | POA: Diagnosis present

## 2019-06-22 DIAGNOSIS — Z923 Personal history of irradiation: Secondary | ICD-10-CM | POA: Diagnosis not present

## 2019-06-22 DIAGNOSIS — J153 Pneumonia due to streptococcus, group B: Secondary | ICD-10-CM

## 2019-06-22 DIAGNOSIS — Z7982 Long term (current) use of aspirin: Secondary | ICD-10-CM | POA: Diagnosis not present

## 2019-06-22 DIAGNOSIS — D701 Agranulocytosis secondary to cancer chemotherapy: Secondary | ICD-10-CM | POA: Diagnosis present

## 2019-06-22 DIAGNOSIS — I129 Hypertensive chronic kidney disease with stage 1 through stage 4 chronic kidney disease, or unspecified chronic kidney disease: Secondary | ICD-10-CM | POA: Diagnosis present

## 2019-06-22 DIAGNOSIS — R197 Diarrhea, unspecified: Secondary | ICD-10-CM | POA: Diagnosis not present

## 2019-06-22 DIAGNOSIS — Z9221 Personal history of antineoplastic chemotherapy: Secondary | ICD-10-CM

## 2019-06-22 DIAGNOSIS — Z8673 Personal history of transient ischemic attack (TIA), and cerebral infarction without residual deficits: Secondary | ICD-10-CM | POA: Diagnosis not present

## 2019-06-22 LAB — CBC
HCT: 26.7 % — ABNORMAL LOW (ref 39.0–52.0)
Hemoglobin: 8.8 g/dL — ABNORMAL LOW (ref 13.0–17.0)
MCH: 30.9 pg (ref 26.0–34.0)
MCHC: 33 g/dL (ref 30.0–36.0)
MCV: 93.7 fL (ref 80.0–100.0)
Platelets: 166 10*3/uL (ref 150–400)
RBC: 2.85 MIL/uL — ABNORMAL LOW (ref 4.22–5.81)
RDW: 15 % (ref 11.5–15.5)
WBC: 1.7 10*3/uL — ABNORMAL LOW (ref 4.0–10.5)
nRBC: 0 % (ref 0.0–0.2)

## 2019-06-22 LAB — URINALYSIS, ROUTINE W REFLEX MICROSCOPIC
Bilirubin Urine: NEGATIVE
Glucose, UA: NEGATIVE mg/dL
Ketones, ur: NEGATIVE mg/dL
Leukocytes,Ua: NEGATIVE
Nitrite: NEGATIVE
Protein, ur: 100 mg/dL — AB
Specific Gravity, Urine: 1.013 (ref 1.005–1.030)
pH: 5 (ref 5.0–8.0)

## 2019-06-22 LAB — COMPREHENSIVE METABOLIC PANEL
ALT: 16 U/L (ref 0–44)
AST: 28 U/L (ref 15–41)
Albumin: 2.5 g/dL — ABNORMAL LOW (ref 3.5–5.0)
Alkaline Phosphatase: 62 U/L (ref 38–126)
Anion gap: 9 (ref 5–15)
BUN: 32 mg/dL — ABNORMAL HIGH (ref 8–23)
CO2: 18 mmol/L — ABNORMAL LOW (ref 22–32)
Calcium: 8.9 mg/dL (ref 8.9–10.3)
Chloride: 105 mmol/L (ref 98–111)
Creatinine, Ser: 1.82 mg/dL — ABNORMAL HIGH (ref 0.61–1.24)
GFR calc Af Amer: 38 mL/min — ABNORMAL LOW (ref >60)
GFR calc non Af Amer: 33 mL/min — ABNORMAL LOW (ref >60)
Glucose, Bld: 127 mg/dL — ABNORMAL HIGH (ref 70–99)
Potassium: 4.1 mmol/L (ref 3.5–5.1)
Sodium: 132 mmol/L — ABNORMAL LOW (ref 135–145)
Total Bilirubin: 0.6 mg/dL (ref 0.3–1.2)
Total Protein: 5.3 g/dL — ABNORMAL LOW (ref 6.5–8.1)

## 2019-06-22 LAB — CBG MONITORING, ED: Glucose-Capillary: 121 mg/dL — ABNORMAL HIGH (ref 70–99)

## 2019-06-22 LAB — SARS CORONAVIRUS 2 (TAT 6-24 HRS): SARS Coronavirus 2: NEGATIVE

## 2019-06-22 MED ORDER — AMLODIPINE BESYLATE 5 MG PO TABS
5.0000 mg | ORAL_TABLET | Freq: Every day | ORAL | Status: DC
  Administered 2019-06-23 – 2019-06-24 (×2): 5 mg via ORAL
  Filled 2019-06-22 (×2): qty 1

## 2019-06-22 MED ORDER — ACETAMINOPHEN 650 MG RE SUPP: 650.0000 mg | Freq: Four times a day (QID) | RECTAL | Status: DC | PRN

## 2019-06-22 MED ORDER — VITAMIN D (ERGOCALCIFEROL) 1.25 MG (50000 UNIT) PO CAPS
50000.0000 [IU] | ORAL_CAPSULE | ORAL | Status: DC
  Administered 2019-06-27: 50000 [IU] via ORAL
  Filled 2019-06-22: qty 1

## 2019-06-22 MED ORDER — MEGESTROL ACETATE 400 MG/10ML PO SUSP: 600.0000 mg | Freq: Every day | ORAL | Status: DC

## 2019-06-22 MED ORDER — POLYVINYL ALCOHOL 1.4 % OP SOLN
1.0000 [drp] | OPHTHALMIC | Status: DC | PRN
  Filled 2019-06-22: qty 15

## 2019-06-22 MED ORDER — BACID PO TABS
ORAL_TABLET | Freq: Every day | ORAL | Status: DC
  Administered 2019-06-23 – 2019-06-25 (×3): 1 via ORAL
  Administered 2019-06-26: 2 via ORAL
  Administered 2019-06-27: 1 via ORAL
  Filled 2019-06-22 (×5): qty 1

## 2019-06-22 MED ORDER — BENAZEPRIL HCL 20 MG PO TABS
10.0000 mg | ORAL_TABLET | Freq: Every day | ORAL | Status: DC
  Administered 2019-06-23 – 2019-06-27 (×5): 10 mg via ORAL
  Filled 2019-06-22 (×4): qty 0.5
  Filled 2019-06-22: qty 1
  Filled 2019-06-22: qty 0.5

## 2019-06-22 MED ORDER — ONDANSETRON HCL 4 MG PO TABS: 8.0000 mg | ORAL_TABLET | Freq: Two times a day (BID) | ORAL | Status: DC | PRN

## 2019-06-22 MED ORDER — ACETAMINOPHEN 325 MG PO TABS
650.0000 mg | ORAL_TABLET | Freq: Four times a day (QID) | ORAL | Status: DC | PRN
  Administered 2019-06-24: 650 mg via ORAL
  Filled 2019-06-22: qty 2

## 2019-06-22 MED ORDER — VITAMIN B-12 100 MCG PO TABS
500.0000 ug | ORAL_TABLET | Freq: Every day | ORAL | Status: DC
  Administered 2019-06-23 – 2019-06-27 (×5): 500 ug via ORAL
  Filled 2019-06-22: qty 1
  Filled 2019-06-22 (×4): qty 5

## 2019-06-22 MED ORDER — VANCOMYCIN VARIABLE DOSE PER UNSTABLE RENAL FUNCTION (PHARMACIST DOSING): Status: DC

## 2019-06-22 MED ORDER — ONDANSETRON HCL 4 MG PO TABS: 4.0000 mg | ORAL_TABLET | Freq: Four times a day (QID) | ORAL | Status: DC | PRN

## 2019-06-22 MED ORDER — SODIUM CHLORIDE 0.9 % IV SOLN
INTRAVENOUS | Status: DC
  Administered 2019-06-22: 23:00:00 via INTRAVENOUS

## 2019-06-22 MED ORDER — VANCOMYCIN HCL 10 G IV SOLR
1500.0000 mg | Freq: Once | INTRAVENOUS | Status: AC
  Administered 2019-06-22: 1500 mg via INTRAVENOUS
  Filled 2019-06-22: qty 1500

## 2019-06-22 MED ORDER — ASPIRIN EC 81 MG PO TBEC
81.0000 mg | DELAYED_RELEASE_TABLET | Freq: Every day | ORAL | Status: DC
  Administered 2019-06-23 – 2019-06-27 (×5): 81 mg via ORAL
  Filled 2019-06-22 (×5): qty 1

## 2019-06-22 MED ORDER — PREDNISONE 20 MG PO TABS: 20.0000 mg | ORAL_TABLET | Freq: Every day | ORAL | 2 refills | Status: DC

## 2019-06-22 MED ORDER — FINASTERIDE 5 MG PO TABS
5.0000 mg | ORAL_TABLET | Freq: Every day | ORAL | Status: DC
  Administered 2019-06-23 – 2019-06-27 (×5): 5 mg via ORAL
  Filled 2019-06-22 (×5): qty 1

## 2019-06-22 MED ORDER — LORAZEPAM 0.5 MG PO TABS: 0.5000 mg | ORAL_TABLET | Freq: Four times a day (QID) | ORAL | Status: DC | PRN

## 2019-06-22 MED ORDER — SODIUM CHLORIDE 0.9 % IV BOLUS
1000.0000 mL | Freq: Once | INTRAVENOUS | Status: AC
  Administered 2019-06-22: 1000 mL via INTRAVENOUS

## 2019-06-22 MED ORDER — VITAMIN B-6 100 MG PO TABS
100.0000 mg | ORAL_TABLET | Freq: Every day | ORAL | Status: DC
  Administered 2019-06-23 – 2019-06-27 (×5): 100 mg via ORAL
  Filled 2019-06-22 (×5): qty 1

## 2019-06-22 MED ORDER — HEPARIN SODIUM (PORCINE) 5000 UNIT/ML IJ SOLN
5000.0000 [IU] | Freq: Three times a day (TID) | INTRAMUSCULAR | Status: DC
  Administered 2019-06-22 – 2019-06-27 (×14): 5000 [IU] via SUBCUTANEOUS
  Filled 2019-06-22 (×9): qty 1

## 2019-06-22 MED ORDER — AMLODIPINE BESY-BENAZEPRIL HCL 5-10 MG PO CAPS: 1.0000 | ORAL_CAPSULE | Freq: Every day | ORAL | Status: DC

## 2019-06-22 MED ORDER — TRAMADOL HCL 50 MG PO TABS: 50.0000 mg | ORAL_TABLET | Freq: Three times a day (TID) | ORAL | Status: DC | PRN

## 2019-06-22 MED ORDER — DRONABINOL 2.5 MG PO CAPS
5.0000 mg | ORAL_CAPSULE | Freq: Two times a day (BID) | ORAL | Status: DC
  Administered 2019-06-22 – 2019-06-27 (×8): 5 mg via ORAL
  Filled 2019-06-22 (×3): qty 2
  Filled 2019-06-22: qty 1
  Filled 2019-06-22 (×4): qty 2

## 2019-06-22 MED ORDER — PROCHLORPERAZINE MALEATE 10 MG PO TABS
10.0000 mg | ORAL_TABLET | Freq: Four times a day (QID) | ORAL | Status: DC | PRN
  Filled 2019-06-22: qty 1

## 2019-06-22 MED ORDER — LIDOCAINE-PRILOCAINE 2.5-2.5 % EX CREA: TOPICAL_CREAM | Freq: Once | CUTANEOUS | Status: DC

## 2019-06-22 MED ORDER — ONDANSETRON HCL 4 MG/2ML IJ SOLN: 4.0000 mg | Freq: Four times a day (QID) | INTRAMUSCULAR | Status: DC | PRN

## 2019-06-22 MED ORDER — SODIUM CHLORIDE 0.9 % IV SOLN
2.0000 g | INTRAVENOUS | Status: DC
  Administered 2019-06-22 – 2019-06-24 (×3): 2 g via INTRAVENOUS
  Filled 2019-06-22 (×4): qty 2

## 2019-06-22 NOTE — Telephone Encounter (Signed)
Message received from patient's grandson, [name] stating that pt is concerned that he is losing weight, has a decreased appetite and would like to know if there is anything else he can take to increase his appetite.  Dr. Marin Olp notified.  Call placed back to [facility] per his request to inform him that Dr. Marin Olp would like for pt to take Prednisone 20 mg daily.  Prescription sent to [facility] per Jeremy's request.

## 2019-06-22 NOTE — ED Notes (Signed)
CBG Results of 121 reported to [place], Therapist, sports.

## 2019-06-22 NOTE — Progress Notes (Signed)
Pharmacy Antibiotic Note  Christian Burns is a 85 y.o. male admitted on 06/22/2019 with dizziness.  Pharmacy has been consulted for vancomycin and cefepime dosing for pneumonia. Chest xray on 11/10 found possible multifocal pneumonia. WBC 1.7. Afebrile. Scr 1.82 with current CrCl of 25.3 ml/min. Baseline Scr is approximately 1.4 to 1.5.    Plan: Start cefepime 2g IV q24h  Give vancomycin 1500mg  IV x1 loading dose  Dose vancomycin based on random levels due to renal function Obtain vancomycin random level as appropriate and monitor Scr and UOP closely Monitor renal function, cultures/sensitivites, and clinical progression daily   Height: 5\' 10"  (177.8 cm) Weight: 138 lb (62.6 kg) IBW/kg (Calculated) : 73  Temp (24hrs), Avg:97.7 F (36.5 C), Min:97.7 F (36.5 C), Max:97.7 F (36.5 C)  Recent Labs  Lab 06/22/19 1607 06/22/19 1614  WBC 1.7*  --   CREATININE  --  1.82*    Estimated Creatinine Clearance: 25.3 mL/min (A) (by C-G formula based on SCr of 1.82 mg/dL (H)).    Allergies  Allergen Reactions  . Sulfa Antibiotics Itching  . Sulfasalazine Itching    Antimicrobials this admission: Vancomycin 11/10 >> Cefepime 11/10 >>  Dose adjustments this admission: N/A  Microbiology results: 11/19 COVID-19: ordered  Thank you for allowing pharmacy to be a part of this patient's care.  Cristela Felt, PharmD PGY1 Pharmacy Resident [employer]: [phone]  06/22/2019 8:19 PM

## 2019-06-22 NOTE — H&P (Signed)
History and Physical   Christian Burns QPY:[id number] DOB: 11-17-1929 DOA: 06/22/2019  Referring MD/NP/PA: Dr. Deno Etienne  PCP: Leanna Battles, MD   Outpatient Specialists: Dr. Burney Gauze, oncology  Patient coming from: Home  Chief Complaint: Generalized weakness and cough  HPI: Christian Burns is a 85 y.o. male with medical history significant of multiple myeloma, chronic kidney disease stage III, CVA, hypercalcemia, hypertension, history of radiation therapy who is currently on chemotherapy for his myeloma.  Patient has been feeling weak and debilitated over the past week.  He is lost his appetite.  He is generally not able to move around as he usually does.  He denied any significant fever or chills.  No known exposure to COVID-19.  Patient went to see his oncologist today and as part of evaluation was placed on prednisone that he has not started yet.  Goal was to stimulate his appetite.  He got up later at home but then felt very dizzy and almost passed out.  Son therefore brought him to the emergency room where he is being evaluated.  Patient was found to have bilateral infiltrates concerning for pneumonia.  He has multifocal infiltrates and known immunosuppression from his chemotherapy.  He is being admitted with suspected COVID-19 infection.  ED Course: Temperature 97.7 blood pressure 140/77 pulse is 80 respiratory 21 oxygen sat 95% on room air.  Sodium is 132 potassium 4.1 chloride 105 CO2 of 18 glucose 127 BUN 32 creatinine 1.82.  White count is 1.5 hemoglobin 9.1 platelets of 194.  Essentially negative.  Chest x-ray showed vague foci of airspace disease in the right upper lobe on lung bases possibly multifocal pneumonia.  COVID-19 screen is negative.  Patient has been admitted with multifocal pneumonia and leukopenia.  Review of Systems: As per HPI otherwise 10 point review of systems negative.    Past Medical History:  Diagnosis Date  . CKD (chronic kidney disease) 07/06/2018   CKD  stage III  . Goals of care, counseling/discussion 02/04/2019  . History of radiation therapy 06/17/13-07/06/13   35 gray to upper lumbar spine  . 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 Hypertension   . Inguinal hernia   . Multiple myeloma [facility]) Sept 2010  . Multiple myeloma in relapse ([place]) 04/05/2016  . Radiation 09/26/15-10/20/15   lower thoracic spine, upper lumbar spine 30 gray  . Stroke [facility])     Past Surgical History:  Procedure Laterality Date  . COLONOSCOPY    . HIP PINNING,CANNULATED Right 09/01/2018   Procedure: [facility] NAIL HIP PINNING;  Surgeon: Melrose Nakayama, MD;  Location: [place];  Service: Orthopedics;  Laterality: Right;  . LOOP RECORDER INSERTION N/A 04/21/2019   Procedure: LOOP RECORDER INSERTION;  Surgeon: Evans Lance, MD;  Location: [place] [facility];  Service: Cardiovascular;  Laterality: N/A;  . TEE WITHOUT CARDIOVERSION N/A 10/09/2018   Procedure: TRANSESOPHAGEAL ECHOCARDIOGRAM (TEE);  Surgeon: Acie Fredrickson Wonda Cheng, MD;  Location: [facility] ENDOSCOPY;  Service: Cardiovascular;  Laterality: N/A;  . TONSILLECTOMY       reports that he has never smoked. He has never used smokeless tobacco. He reports current alcohol use of about 4.0 standard drinks of alcohol per week. He reports that he does not use drugs.  Allergies  Allergen Reactions  . Sulfa Antibiotics Itching  . Sulfasalazine Itching    Family History  Problem Relation Age of Onset  . Hypertension Mother   . Hypertension Father      Prior to Admission medications  Medication Sig Start Date End Date Taking? Authorizing Provider  amLODipine-benazepril (LOTREL) 5-10 MG capsule TAKE ONE CAPSULE EACH DAY Patient taking differently: Take 1 capsule by mouth daily.  06/15/19  Yes [name], MD  aspirin EC 81 MG EC tablet Take 1 tablet (81 mg total) by mouth daily. 04/22/19  Yes Donzetta Starch, NP  dronabinol (MARINOL) 5 MG capsule TAKE ONE CAPSULE TWICE DAILY BEFORE MEALS Patient taking differently:  Take 5 mg by mouth 2 (two) times daily before lunch and supper.  05/28/19  Yes [name], MD  finasteride (PROSCAR) 5 MG tablet TAKE ONE TABLET EACH DAY Patient taking differently: Take 5 mg by mouth daily.  04/22/19  Yes Ennever, Rudell Cobb, MD  ondansetron (ZOFRAN) 8 MG tablet Take 1 tablet (8 mg total) by mouth 2 (two) times daily as needed for refractory nausea / vomiting. Start on day 2 after Bendamustine chemotherapy. 02/10/19  Yes [name], MD  penicillin v potassium (VEETID) 500 MG tablet Take 1 tablet (500 mg total) by mouth 4 (four) times daily. #120 Patient taking differently: Take 500 mg by mouth 4 (four) times daily.  04/27/19  Yes Carlyle Basques, MD  polyvinyl alcohol (LIQUIFILM TEARS) 1.4 % ophthalmic solution Place 1 drop into both eyes as needed for dry eyes. 03/05/19  Yes Swayze, Ava, DO  Probiotic Product (PROBIOTIC PO) Take 1 capsule by mouth daily.   Yes [provider]  prochlorperazine (COMPAZINE) 10 MG tablet Take 1 tablet (10 mg total) by mouth every 6 (six) hours as needed (nausea and vomiting). 02/10/19  Yes [name], MD  pyridOXINE (VITAMIN B-6) 100 MG tablet Take 100 mg by mouth daily.  09/17/13  Yes [name], MD  traMADol (ULTRAM) 50 MG tablet Take 1 tablet (50 mg total) by mouth every 8 (eight) hours as needed for moderate pain. 04/21/19  Yes Donzetta Starch, NP  vitamin B-12 (CYANOCOBALAMIN) 500 MCG tablet Take 500 mcg by mouth daily.   Yes [provider]  Vitamin D, Ergocalciferol, (DRISDOL) 50000 UNITS CAPS capsule Take 50,000 Units by mouth every Sunday.    Yes [provider]  dextromethorphan-guaiFENesin (MUCINEX DM) 30-600 MG 12hr tablet Take 1 tablet by mouth 2 (two) times daily as needed for cough. Patient not taking: Reported on 06/22/2019 04/21/19   Donzetta Starch, NP  lidocaine-prilocaine (EMLA) cream Apply to affected area once Patient not taking: Reported on 06/22/2019 02/10/19   [name], MD  LORazepam  (ATIVAN) 0.5 MG tablet Take 1 tablet (0.5 mg total) by mouth every 6 (six) hours as needed (Nausea or vomiting). 02/10/19   [name], MD  megestrol (MEGACE) 400 MG/10ML suspension Take 15 mLs (600 mg total) by mouth daily. Patient not taking: Reported on 06/22/2019 01/08/19   [name], MD  predniSONE (DELTASONE) 20 MG tablet Take 1 tablet (20 mg total) by mouth daily with breakfast. 06/22/19   [name], MD    Physical Exam: Vitals:   06/22/19 1900 06/22/19 1930 06/22/19 2230 06/22/19 2330  BP: 127/77 (!) 141/87 (!) 145/80 (!) 148/77  Pulse: 78 78 79 80  Resp: (!) 21 (!) 21 (!) 21 20  Temp:      TempSrc:      SpO2: 97% 97% 97% 95%  Weight:      Height:          Constitutional: Frail, chronically ill looking Vitals:   06/22/19 1900 06/22/19 1930 06/22/19 2230 06/22/19  2330  BP: 127/77 (!) 141/87 (!) 145/80 (!) 148/77  Pulse: 78 78 79 80  Resp: (!) 21 (!) 21 (!) 21 20  Temp:      TempSrc:      SpO2: 97% 97% 97% 95%  Weight:      Height:       Eyes: PERRL, lids and conjunctivae normal ENMT: Mucous membranes are dry. Posterior pharynx clear of any exudate or lesions.Normal dentition.  Neck: normal, supple, no masses, no thyromegaly Respiratory: Good air entry bilaterally with basal crackles bilaterally normal respiratory effort. No accessory muscle use.  Cardiovascular: Regular rate and rhythm, no murmurs / rubs / gallops. No extremity edema. 2+ pedal pulses. No carotid bruits.  Abdomen: no tenderness, no masses palpated. No hepatosplenomegaly. Bowel sounds positive.  Musculoskeletal: no clubbing / cyanosis. No joint deformity upper and lower extremities. Good ROM, no contractures. Normal muscle tone.  Skin: no rashes, lesions, ulcers. No induration Neurologic: CN 2-12 grossly intact. Sensation intact, DTR normal. Strength 5/5 in all 4.  Psychiatric: Normal judgment and insight. Alert and oriented x 3. Normal mood.     Labs on Admission: I have personally  reviewed following labs and imaging studies  CBC: Recent Labs  Lab 06/22/19 1607 06/22/19 2310  WBC 1.7* 1.5*  HGB 8.8* 9.0*  HCT 26.7* 27.4*  MCV 93.7 94.8  PLT 166 500   Basic Metabolic Panel: Recent Labs  Lab 06/22/19 1614 06/22/19 2310  NA 132*  --   K 4.1  --   CL 105  --   CO2 18*  --   GLUCOSE 127*  --   BUN 32*  --   CREATININE 1.82* 1.58*  CALCIUM 8.9  --    GFR: Estimated Creatinine Clearance: 29.2 mL/min (A) (by C-G formula based on SCr of 1.58 mg/dL (H)). Liver Function Tests: Recent Labs  Lab 06/22/19 1614  AST 28  ALT 16  ALKPHOS 62  BILITOT 0.6  PROT 5.3*  ALBUMIN 2.5*   No results for input(s): LIPASE, AMYLASE in the last 168 hours. No results for input(s): AMMONIA in the last 168 hours. Coagulation Profile: No results for input(s): INR, PROTIME in the last 168 hours. Cardiac Enzymes: No results for input(s): CKTOTAL, CKMB, CKMBINDEX, TROPONINI in the last 168 hours. BNP (last 3 results) No results for input(s): PROBNP in the last 8760 hours. HbA1C: No results for input(s): HGBA1C in the last 72 hours. CBG: Recent Labs  Lab 06/22/19 1607  GLUCAP 121*   Lipid Profile: No results for input(s): CHOL, HDL, LDLCALC, TRIG, CHOLHDL, LDLDIRECT in the last 72 hours. Thyroid Function Tests: No results for input(s): TSH, T4TOTAL, FREET4, T3FREE, THYROIDAB in the last 72 hours. Anemia Panel: No results for input(s): VITAMINB12, FOLATE, FERRITIN, TIBC, IRON, RETICCTPCT in the last 72 hours. Urine analysis:    Component Value Date/Time   COLORURINE YELLOW 06/22/2019 1742   APPEARANCEUR CLEAR 06/22/2019 1742   LABSPEC 1.013 06/22/2019 1742   LABSPEC 1.020 09/17/2013 1036   PHURINE 5.0 06/22/2019 1742   GLUCOSEU NEGATIVE 06/22/2019 1742   HGBUR SMALL (A) 06/22/2019 1742   BILIRUBINUR NEGATIVE 06/22/2019 1742   KETONESUR NEGATIVE 06/22/2019 1742   PROTEINUR 100 (A) 06/22/2019 1742   UROBILINOGEN 0.2 09/17/2013 1036   NITRITE NEGATIVE  06/22/2019 1742   LEUKOCYTESUR NEGATIVE 06/22/2019 1742   Sepsis Labs: _0 (procalcitonin:4,lacticidven:4) ) Recent Results (from the past 240 hour(s))  SARS CORONAVIRUS 2 (TAT 6-24 HRS) Nasopharyngeal Nasopharyngeal Swab     Status: None  Collection Time: 06/22/19  4:47 PM   Specimen: Nasopharyngeal Swab  Result Value Ref Range Status   SARS Coronavirus 2 NEGATIVE NEGATIVE Final    Comment: (NOTE) SARS-CoV-2 target nucleic acids are NOT DETECTED. The SARS-CoV-2 RNA is generally detectable in upper and lower respiratory specimens during the acute phase of infection. Negative results do not preclude SARS-CoV-2 infection, do not rule out co-infections with other pathogens, and should not be used as the sole basis for treatment or other patient management decisions. Negative results must be combined with clinical observations, patient history, and epidemiological information. The expected result is Negative. Fact Sheet for Patients: SugarRoll.be Fact Sheet for Healthcare Providers: https://www.woods-mathews.com/ This test is not yet approved or cleared by the [place] FDA and  has been authorized for detection and/or diagnosis of SARS-CoV-2 by FDA under an Emergency Use Authorization (EUA). This EUA will remain  in effect (meaning this test can be used) for the duration of the COVID-19 declaration under Section 56 4(b)(1) of the Act, 21 U.S.C. section 360bbb-3(b)(1), unless the authorization is terminated or revoked sooner. Performed at [facility], [place] [address] [place], [place] [address]      Radiological Exams on Admission: Dg Chest Port 1 View  Result Date: 06/22/2019 CLINICAL DATA:  Cough and weakness EXAM: PORTABLE CHEST 1 VIEW COMPARISON:  04/19/2019 FINDINGS: Vague opacity in the right upper lobe. Patchy airspace opacity at the bases. Normal heart size. Aortic atherosclerosis. No pneumothorax. IMPRESSION:  Vague foci of airspace disease in the right upper lobe and lung bases, possible multifocal pneumonia. Electronically Signed   By: Donavan Foil M.D.   On: 06/22/2019 17:23      Assessment/Plan Principal Problem:   HCAP (healthcare-associated pneumonia) Active Problems:   Myeloma ([place])   Iron deficiency anemia secondary to inadequate dietary iron intake   Acute osteomyelitis of jaw   Essential hypertension   Leukopenia due to antineoplastic chemotherapy [facility])     #1 Healthcare associated pneumonia: Patient is COVID-19 screen is negative.  We will admit the patient and treat for HCAP.  IV Vanco and cefepime.  Blood cultures will be obtained.  Monitor response and transition to oral therapy.  #2 leukopenia: Secondary to treatment for his myeloma.  We will continue with neutropenic precaution while treating patient with IV antibiotics.  #3 multiple myeloma: Continue care per oncology.  #4 generalized debility: Most likely secondary to acute infection.  We will get physical therapy and occupational therapy prior to discharge  #5 hypertension: Continue home regimen and monitor closely  #6 history of osteomyelitis: Patient chronically on penicillin.  We will hold it while on treatment for the pneumonia.  Resume therapy at discharge   DVT prophylaxis: Heparin Code Status: Full code Family Communication: Son at bedside Disposition Plan: Home Consults called: None but may call oncology in the morning Admission status: Inpatient  Severity of Illness: The appropriate patient status for this patient is INPATIENT. Inpatient status is judged to be reasonable and necessary in order to provide the required intensity of service to ensure the patient's safety. The patient's presenting symptoms, physical exam findings, and initial radiographic and laboratory data in the context of their chronic comorbidities is felt to place them at high risk for further clinical deterioration. Furthermore, it is  not anticipated that the patient will be medically stable for discharge from the hospital within 2 midnights of admission. The following factors support the patient status of inpatient.   " The patient's presenting symptoms include weakness  and shortness of breath. " The worrisome physical exam findings include generalized weakness with some basal crackles. " The initial radiographic and laboratory data are worrisome because of evidence of multifocal pneumonia. " The chronic co-morbidities include multiple myeloma.   * I certify that at the point of admission it is my clinical judgment that the patient will require inpatient hospital care spanning beyond 2 midnights from the point of admission due to high intensity of service, high risk for further deterioration and high frequency of surveillance required.Barbette Merino MD [facility] Pager [phone]  If 7PM-7AM, please contact night-coverage [email or web address] Password [email or web address]  06/23/2019, 12:41 AM

## 2019-06-22 NOTE — ED Triage Notes (Signed)
Pt arrived via [facility] from [place] [facility] for c/o sudden onset dizziness and nausea that started at 1430 today. Pt states both have now subsided. Per EMS orthostatics were positive. Pt recently had stroke 4 wks ago. Initial bp 70 palp. bp after NS 219ml 110/66. Pt is A&Ox4. Pt is NSR

## 2019-06-22 NOTE — ED Provider Notes (Signed)
[facility] EMERGENCY DEPARTMENT Provider Note   CSN: [id number] Arrival date & time: 06/22/19  [place]     History   Chief Complaint Chief Complaint  Patient presents with  . dizziness    HPI Christian Burns is a 85 y.o. male.     The history is provided by the patient. No language interpreter was used.  Weakness Severity:  Moderate Onset quality:  Gradual Duration:  1 week Timing:  Constant Progression:  Worsening Chronicity:  New Context: dehydration   Relieved by:  Nothing Ineffective treatments:  None tried Associated symptoms: cough   Associated symptoms: no abdominal pain   Risk factors: anemia   Patient has a history of multiple myeloma he reports increasing weakness for the past week.  Patient states that he has a poor appetite.  Patient has a cough he denies any fever or chills.  Patient lives at home with his wife.  His wife felt he needed to come to the hospital because he was having difficulty walking.   Past Medical History:  Diagnosis Date  . CKD (chronic kidney disease) 07/06/2018   CKD stage III  . Goals of care, counseling/discussion 02/04/2019  . History of radiation therapy 06/17/13-07/06/13   35 gray to upper lumbar spine  . 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 Hypertension   . Inguinal hernia   . Multiple myeloma [facility]) Sept 2010  . Multiple myeloma in relapse ([place]) 04/05/2016  . Radiation 09/26/15-10/20/15   lower thoracic spine, upper lumbar spine 30 gray  . Stroke [facility])     Patient Active Problem List   Diagnosis Date Noted  . Hyperlipidemia LDL goal <70 04/20/2019  . Essential hypertension 04/20/2019  . Advanced age 31/03/2019  . Stroke ([place]) 04/19/2019  . Community acquired pneumonia 03/03/2019  . AKI (acute kidney injury) ([place]) 03/03/2019  . Normocytic anemia 03/03/2019  . Goals of care, counseling/discussion 02/04/2019  . Acute osteomyelitis of jaw 11/26/2018  . Bacteremia   . History of total right hip  replacement 10/07/2018  . Sepsis due to pneumonia ([place])   . Left lower lobe pneumonia 10/06/2018  . Right hip pain 09/01/2018  . Iron deficiency anemia secondary to inadequate dietary iron intake 07/18/2016  . Multiple myeloma in relapse ([place]) 04/05/2016  . 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 Multiple myeloma in remission ([place]) 09/08/2015  . Myeloma ([place]) 08/23/2011    Past Surgical History:  Procedure Laterality Date  . COLONOSCOPY    . HIP PINNING,CANNULATED Right 09/01/2018   Procedure: [facility] NAIL HIP PINNING;  Surgeon: Melrose Nakayama, MD;  Location: [facility];  Service: Orthopedics;  Laterality: Right;  . LOOP RECORDER INSERTION N/A 04/21/2019   Procedure: LOOP RECORDER INSERTION;  Surgeon: Evans Lance, MD;  Location: [place] [facility] CV LAB;  Service: Cardiovascular;  Laterality: N/A;  . TEE WITHOUT CARDIOVERSION N/A 10/09/2018   Procedure: TRANSESOPHAGEAL ECHOCARDIOGRAM (TEE);  Surgeon: Acie Fredrickson Wonda Cheng, MD;  Location: [facility] ENDOSCOPY;  Service: Cardiovascular;  Laterality: N/A;  . TONSILLECTOMY          Home Medications    Prior to Admission medications   Medication Sig Start Date End Date Taking? Authorizing Provider  amLODipine-benazepril (LOTREL) 5-10 MG capsule TAKE ONE CAPSULE EACH DAY 06/15/19   [name], MD  aspirin EC 81 MG EC tablet Take 1 tablet (81 mg total) by mouth daily. 04/22/19   Donzetta Starch, NP  dextromethorphan-guaiFENesin (MUCINEX DM) 30-600 MG 12hr tablet Take 1 tablet by mouth  2 (two) times daily as needed for cough. 04/21/19   Donzetta Starch, NP  dronabinol (MARINOL) 5 MG capsule TAKE ONE CAPSULE TWICE DAILY BEFORE MEALS 05/28/19   [name], MD  finasteride (PROSCAR) 5 MG tablet TAKE ONE TABLET EACH DAY 04/22/19   [name], MD  lidocaine-prilocaine (EMLA) cream Apply to affected area once 02/10/19   Ennever, Rudell Cobb, MD  LORazepam (ATIVAN) 0.5 MG tablet Take 1 tablet (0.5 mg total) by mouth every 6 (six) hours as needed (Nausea  or vomiting). 02/10/19   [name], MD  megestrol (MEGACE) 400 MG/10ML suspension Take 15 mLs (600 mg total) by mouth daily. 01/08/19   [name], MD  Multiple Vitamin (MULTIVITAMIN WITH MINERALS) TABS tablet Take 1 tablet by mouth daily.    [provider]  ondansetron (ZOFRAN) 8 MG tablet Take 1 tablet (8 mg total) by mouth 2 (two) times daily as needed for refractory nausea / vomiting. Start on day 2 after Bendamustine chemotherapy. 02/10/19   [name], MD  penicillin v potassium (VEETID) 500 MG tablet Take 1 tablet (500 mg total) by mouth 4 (four) times daily. #120 04/27/19   Carlyle Basques, MD  polyvinyl alcohol (LIQUIFILM TEARS) 1.4 % ophthalmic solution Place 1 drop into both eyes as needed for dry eyes. 03/05/19   Swayze, Ava, DO  predniSONE (DELTASONE) 20 MG tablet Take 1 tablet (20 mg total) by mouth daily with breakfast. 06/22/19   [name], MD  Probiotic Product (PROBIOTIC PO) Take 1 capsule by mouth daily.    [provider]  prochlorperazine (COMPAZINE) 10 MG tablet Take 1 tablet (10 mg total) by mouth every 6 (six) hours as needed (nausea and vomiting). 02/10/19   [name], MD  pyridOXINE (VITAMIN B-6) 100 MG tablet Take 100 mg by mouth daily.  09/17/13   [name], MD  traMADol (ULTRAM) 50 MG tablet Take 1 tablet (50 mg total) by mouth every 8 (eight) hours as needed for moderate pain. 04/21/19   Donzetta Starch, NP  vitamin B-12 (CYANOCOBALAMIN) 500 MCG tablet Take 500 mcg by mouth daily.    [provider]  Vitamin D, Ergocalciferol, (DRISDOL) 50000 UNITS CAPS capsule Take 50,000 Units by mouth every Sunday.     [provider]    Family History Family History  Problem Relation Age of Onset  . Hypertension Mother   . Hypertension Father     Social History Social History   Tobacco Use  . Smoking status: Never Smoker  . Smokeless tobacco: Never Used  . Tobacco comment: never used tobacco  Substance Use  Topics  . Alcohol use: Yes    Alcohol/week: 4.0 standard drinks    Types: 4 Glasses of wine per week  . Drug use: No     Allergies   Sulfa antibiotics and Sulfasalazine   Review of Systems Review of Systems  Respiratory: Positive for cough.   Gastrointestinal: Negative for abdominal pain.  Neurological: Positive for weakness.  All other systems reviewed and are negative.    Physical Exam Updated Vital Signs BP 122/68   Pulse 74   Temp 97.7 F (36.5 C) (Oral)   Resp 19   Ht _0  (1.778 m)   Wt 62.6 kg   SpO2 98%   BMI 19.80 kg/m   Physical Exam Vitals signs and nursing note reviewed.  Constitutional:      Appearance: He is well-developed. He is ill-appearing.  HENT:  Head: Normocephalic and atraumatic.     Mouth/Throat:     Mouth: Mucous membranes are moist.  Eyes:     Conjunctiva/sclera: Conjunctivae normal.  Neck:     Musculoskeletal: Neck supple.  Cardiovascular:     Rate and Rhythm: Normal rate and regular rhythm.     Heart sounds: No murmur.  Pulmonary:     Effort: Pulmonary effort is normal. No respiratory distress.     Breath sounds: Rhonchi present.  Abdominal:     Palpations: Abdomen is soft.     Tenderness: There is no abdominal tenderness.  Musculoskeletal: Normal range of motion.     Right lower leg: No edema.     Left lower leg: No edema.  Skin:    General: Skin is warm and dry.  Neurological:     General: No focal deficit present.     Mental Status: He is alert.  Psychiatric:        Mood and Affect: Mood normal.      ED Treatments / Results  Labs (all labs ordered are listed, but only abnormal results are displayed) Labs Reviewed  CBC - Abnormal; Notable for the following components:      Result Value   WBC 1.7 (*)    RBC 2.85 (*)    Hemoglobin 8.8 (*)    HCT 26.7 (*)    All other components within normal limits  COMPREHENSIVE METABOLIC PANEL - Abnormal; Notable for the following components:   Sodium 132 (*)    CO2 18  (*)    Glucose, Bld 127 (*)    BUN 32 (*)    Creatinine, Ser 1.82 (*)    Total Protein 5.3 (*)    Albumin 2.5 (*)    GFR calc non Af Amer 33 (*)    GFR calc Af Amer 38 (*)    All other components within normal limits  CBG MONITORING, ED - Abnormal; Notable for the following components:   Glucose-Capillary 121 (*)    All other components within normal limits  SARS CORONAVIRUS 2 (TAT 6-24 HRS)  URINALYSIS, ROUTINE W REFLEX MICROSCOPIC    EKG EKG Interpretation  Date/Time:  Tuesday June 22 2019 16:05:27 EST Ventricular Rate:  75 PR Interval:    QRS Duration: 80 QT Interval:  381 QTC Calculation: 426 R Axis:   85 Text Interpretation: Sinus rhythm Consider left atrial enlargement Borderline right axis deviation No significant change since last tracing Confirmed by Deno Etienne [phone]) on 06/22/2019 4:12:31 PM   Radiology Dg Chest Port 1 View  Result Date: 06/22/2019 CLINICAL DATA:  Cough and weakness EXAM: PORTABLE CHEST 1 VIEW COMPARISON:  04/19/2019 FINDINGS: Vague opacity in the right upper lobe. Patchy airspace opacity at the bases. Normal heart size. Aortic atherosclerosis. No pneumothorax. IMPRESSION: Vague foci of airspace disease in the right upper lobe and lung bases, possible multifocal pneumonia. Electronically Signed   By: Donavan Foil M.D.   On: 06/22/2019 17:23    Procedures Procedures (including critical care time)  Medications Ordered in ED Medications  sodium chloride 0.9 % bolus 1,000 mL (1,000 mLs Intravenous New Bag/Given 06/22/19 1712)     Initial Impression / Assessment and Plan / ED Course  I have reviewed the triage vital signs and the nursing notes.  Pertinent labs & imaging results that were available during my care of the patient were reviewed by me and considered in my medical decision making (see chart for details).        MDM  Chest xray shows multifocal pneumonia.  Cbc shows wbc count of 1.7  I suspect covid.  Covid test ordered.  I  spoke with Hospitalist who will see and evaluate. Final Clinical Impressions(s) / ED Diagnoses   Final diagnoses:  Healthcare-associated pneumonia    ED Discharge Orders    None       Sidney Ace 06/22/19 2007    Floyd, Dan, DO 06/22/19 2016

## 2019-06-23 ENCOUNTER — Encounter (HOSPITAL_COMMUNITY): Payer: Self-pay | Admitting: *Deleted

## 2019-06-23 DIAGNOSIS — D701 Agranulocytosis secondary to cancer chemotherapy: Secondary | ICD-10-CM | POA: Diagnosis present

## 2019-06-23 DIAGNOSIS — N183 Chronic kidney disease, stage 3 unspecified: Secondary | ICD-10-CM | POA: Diagnosis present

## 2019-06-23 DIAGNOSIS — R110 Nausea: Secondary | ICD-10-CM | POA: Diagnosis present

## 2019-06-23 DIAGNOSIS — R197 Diarrhea, unspecified: Secondary | ICD-10-CM | POA: Diagnosis not present

## 2019-06-23 DIAGNOSIS — T451X5A Adverse effect of antineoplastic and immunosuppressive drugs, initial encounter: Secondary | ICD-10-CM | POA: Diagnosis present

## 2019-06-23 LAB — CBC
HCT: 27.4 % — ABNORMAL LOW (ref 39.0–52.0)
HCT: 27.1 % — ABNORMAL LOW (ref 39.0–52.0)
Hemoglobin: 9 g/dL — ABNORMAL LOW (ref 13.0–17.0)
Hemoglobin: 8.9 g/dL — ABNORMAL LOW (ref 13.0–17.0)
MCH: 31.1 pg (ref 26.0–34.0)
MCH: 30.7 pg (ref 26.0–34.0)
MCHC: 32.8 g/dL (ref 30.0–36.0)
MCHC: 32.8 g/dL (ref 30.0–36.0)
MCV: 94.8 fL (ref 80.0–100.0)
MCV: 93.4 fL (ref 80.0–100.0)
Platelets: 194 10*3/uL (ref 150–400)
Platelets: 172 10*3/uL (ref 150–400)
RBC: 2.89 MIL/uL — ABNORMAL LOW (ref 4.22–5.81)
RBC: 2.9 MIL/uL — ABNORMAL LOW (ref 4.22–5.81)
RDW: 14.8 % (ref 11.5–15.5)
RDW: 15 % (ref 11.5–15.5)
WBC: 1.5 10*3/uL — ABNORMAL LOW (ref 4.0–10.5)
WBC: 1.7 10*3/uL — ABNORMAL LOW (ref 4.0–10.5)
nRBC: 0 % (ref 0.0–0.2)
nRBC: 0 % (ref 0.0–0.2)

## 2019-06-23 LAB — COMPREHENSIVE METABOLIC PANEL
ALT: 15 U/L (ref 0–44)
AST: 25 U/L (ref 15–41)
Albumin: 2.2 g/dL — ABNORMAL LOW (ref 3.5–5.0)
Alkaline Phosphatase: 55 U/L (ref 38–126)
Anion gap: 9 (ref 5–15)
BUN: 27 mg/dL — ABNORMAL HIGH (ref 8–23)
CO2: 17 mmol/L — ABNORMAL LOW (ref 22–32)
Calcium: 8.3 mg/dL — ABNORMAL LOW (ref 8.9–10.3)
Chloride: 110 mmol/L (ref 98–111)
Creatinine, Ser: 1.49 mg/dL — ABNORMAL HIGH (ref 0.61–1.24)
GFR calc Af Amer: 48 mL/min — ABNORMAL LOW (ref >60)
GFR calc non Af Amer: 42 mL/min — ABNORMAL LOW (ref >60)
Glucose, Bld: 95 mg/dL (ref 70–99)
Potassium: 3.7 mmol/L (ref 3.5–5.1)
Sodium: 136 mmol/L (ref 135–145)
Total Bilirubin: 0.7 mg/dL (ref 0.3–1.2)
Total Protein: 4.7 g/dL — ABNORMAL LOW (ref 6.5–8.1)

## 2019-06-23 LAB — CREATININE, SERUM
Creatinine, Ser: 1.58 mg/dL — ABNORMAL HIGH (ref 0.61–1.24)
GFR calc Af Amer: 45 mL/min — ABNORMAL LOW (ref >60)
GFR calc non Af Amer: 39 mL/min — ABNORMAL LOW (ref >60)

## 2019-06-23 LAB — STREP PNEUMONIAE URINARY ANTIGEN: Strep Pneumo Urinary Antigen: NEGATIVE

## 2019-06-23 MED ORDER — VANCOMYCIN HCL IN DEXTROSE 1-5 GM/200ML-% IV SOLN
1000.0000 mg | INTRAVENOUS | Status: DC
  Administered 2019-06-24: 1000 mg via INTRAVENOUS
  Filled 2019-06-23 (×2): qty 200

## 2019-06-23 MED ORDER — SODIUM CHLORIDE 0.9 % IV SOLN
INTRAVENOUS | Status: AC
  Administered 2019-06-23 – 2019-06-24 (×2): via INTRAVENOUS

## 2019-06-23 NOTE — ED Notes (Signed)
PAGED ADMITTING PER RN  

## 2019-06-23 NOTE — ED Notes (Signed)
Lunch Tray Ordered @ 1127.  

## 2019-06-23 NOTE — Progress Notes (Signed)
NEW ADMISSION NOTE New Admission Note:   Arrival Method: stretcher from ED Mental Orientation: alert and oriented x4  Telemetry: box 3: NSR Assessment: Completed Skin: dry and intact  IV: RAC ns@ 45ml/hr Pain: 0 Safety Measures: Safety Fall Prevention Plan has been given, discussed and signed Admission: Completed [facility] Orientation: Patient has been orientated to the room, unit and staff.  Family: at bedside  Orders have been reviewed and implemented. Will continue to monitor the patient. Call light has been placed within reach and bed alarm has been activated.   Baldo Ash, RN

## 2019-06-23 NOTE — Progress Notes (Signed)
  Will adjust vancomycin dosing as follows:  Vancomycin 1000 mg IV Q 36 hrs. Goal AUC 400-550. Expected AUC: 491 SCr used: 1.47   Christian Burns 06/23/2019 11:14 AM

## 2019-06-23 NOTE — Progress Notes (Signed)
[facility] PROGRESS NOTE  Christian Burns:[id number] DOB: 1930/07/18 DOA: 06/22/2019 PCP: Leanna Battles, MD  Assessment/Plan: #1 Healthcare associated pneumonia: Patient is COVID-19 screen is negative. Chest xray with possible multifocal pneumonia in patient with MM undergoing chemotherapy. He is afebrile and non-toxic appearing -continue  IV Vanco and cefepime. -follow blood cultures -sputum culture as able -incentive spirometry - Monitor response and transition to oral therapy.  #2 leukopenia: Secondary to treatment for his myeloma.  We will continue with neutropenic precaution while treating patient with IV antibiotics. -monitor  #3 multiple myeloma: Continue care per oncology.  #4 generalized debility: Most likely secondary to acute infection in setting of chronic nausea/decreased oral intake and weight loss related to current chemo. -PT  #5 hypertension: Controlled. Home meds include amlodipine and benazepril - Continue home regimen - monitor closely  #6 history of osteomyelitis: Patient chronically on penicillin.  We will hold it while on treatment for the pneumonia.  Resume therapy at discharge  #7. Diarrhea. Complained of intermittent frequent soft stools over last 2 weeks. Confirmed by wife. No abdominal pain. Likely related to chemo and contributing to #4.  -stool studies -monitor intake and output -check stool cdiff  #8. Ckd. III. creatinien close to baseline.  -monitor urine output -hold nephrotoxins as able.  -recheck in am   Code Status: limited Family Communication: wife on phone Disposition Plan: to be determined   Consultants:    Procedures:    Antibiotics:  Vancomycin 11/10>>>  Cefepime 11/10>>  HPI/Subjective: Awake alert pleasant. Denies pain/discomfort.   Objective: Vitals:   06/23/19 0630 06/23/19 0700  BP: (!) 141/67 136/75  Pulse:  88  Resp: (!) 22 19  Temp:    SpO2:  95%    Intake/Output Summary (Last 24  hours) at 06/23/2019 1003 Last data filed at 06/23/2019 1002 Gross per 24 hour  Intake 2600 ml  Output 200 ml  Net 2400 ml   Filed Weights   06/22/19 1620  Weight: 62.6 kg    Exam:   General:  Awake pale and frail appearing no acute distress  Cardiovascular: rrr no mgr no LE edema  Respiratory: normal effort BS with fine crackles bilateral bases no wheeze  Abdomen: non-distended non-tender +BS  Musculoskeletal: joints without swelling/erythema   Data Reviewed: Basic Metabolic Panel: Recent Labs  Lab 06/22/19 1614 06/22/19 2310 06/23/19 0350  NA 132*  --  136  K 4.1  --  3.7  CL 105  --  110  CO2 18*  --  17*  GLUCOSE 127*  --  95  BUN 32*  --  27*  CREATININE 1.82* 1.58* 1.49*  CALCIUM 8.9  --  8.3*   Liver Function Tests: Recent Labs  Lab 06/22/19 1614 06/23/19 0350  AST 28 25  ALT 16 15  ALKPHOS 62 55  BILITOT 0.6 0.7  PROT 5.3* 4.7*  ALBUMIN 2.5* 2.2*   No results for input(s): LIPASE, AMYLASE in the last 168 hours. No results for input(s): AMMONIA in the last 168 hours. CBC: Recent Labs  Lab 06/22/19 1607 06/22/19 2310 06/23/19 0350  WBC 1.7* 1.5* 1.7*  HGB 8.8* 9.0* 8.9*  HCT 26.7* 27.4* 27.1*  MCV 93.7 94.8 93.4  PLT 166 194 172   Cardiac Enzymes: No results for input(s): CKTOTAL, CKMB, CKMBINDEX, TROPONINI in the last 168 hours. BNP (last 3 results) Recent Labs    10/06/18 2142  BNP 109.5*    ProBNP (last 3 results) No results for input(s): PROBNP in the  last 8760 hours.  CBG: Recent Labs  Lab 06/22/19 1607  GLUCAP 121*    Recent Results (from the past 240 hour(s))  SARS CORONAVIRUS 2 (TAT 6-24 HRS) Nasopharyngeal Nasopharyngeal Swab     Status: None   Collection Time: 06/22/19  4:47 PM   Specimen: Nasopharyngeal Swab  Result Value Ref Range Status   SARS Coronavirus 2 NEGATIVE NEGATIVE Final    Comment: (NOTE) SARS-CoV-2 target nucleic acids are NOT DETECTED. The SARS-CoV-2 RNA is generally detectable in upper and  lower respiratory specimens during the acute phase of infection. Negative results do not preclude SARS-CoV-2 infection, do not rule out co-infections with other pathogens, and should not be used as the sole basis for treatment or other patient management decisions. Negative results must be combined with clinical observations, patient history, and epidemiological information. The expected result is Negative. Fact Sheet for Patients: SugarRoll.be Fact Sheet for Healthcare Providers: https://www.woods-mathews.com/ This test is not yet approved or cleared by the [place] FDA and  has been authorized for detection and/or diagnosis of SARS-CoV-2 by FDA under an Emergency Use Authorization (EUA). This EUA will remain  in effect (meaning this test can be used) for the duration of the COVID-19 declaration under Section 56 4(b)(1) of the Act, 21 U.S.C. section 360bbb-3(b)(1), unless the authorization is terminated or revoked sooner. Performed at [facility], [place] [address] [place], [place] [address]      Studies: Dg Chest Port 1 View  Result Date: 06/22/2019 CLINICAL DATA:  Cough and weakness EXAM: PORTABLE CHEST 1 VIEW COMPARISON:  04/19/2019 FINDINGS: Vague opacity in the right upper lobe. Patchy airspace opacity at the bases. Normal heart size. Aortic atherosclerosis. No pneumothorax. IMPRESSION: Vague foci of airspace disease in the right upper lobe and lung bases, possible multifocal pneumonia. Electronically Signed   By: Donavan Foil M.D.   On: 06/22/2019 17:23    Scheduled Meds: . amLODipine  5 mg Oral Daily  . aspirin EC  81 mg Oral Daily  . benazepril  10 mg Oral Daily  . dronabinol  5 mg Oral BID AC  . finasteride  5 mg Oral Daily  . heparin  5,000 Units Subcutaneous Q8H  . lactobacillus acidophilus   Oral Daily  . pyridOXINE  100 mg Oral Daily  . vancomycin variable dose per unstable renal function (pharmacist dosing)    Does not apply See admin instructions  . vitamin B-12  500 mcg Oral Daily  . [START ON 06/27/2019] Vitamin D (Ergocalciferol)  50,000 Units Oral Q Sun   Continuous Infusions: . sodium chloride    . ceFEPime (MAXIPIME) IV Stopped (06/22/19 2155)    Principal Problem:   HCAP (healthcare-associated pneumonia) Active Problems:   Acute osteomyelitis of jaw   Essential hypertension   Nausea   Iron deficiency anemia secondary to inadequate dietary iron intake   Leukopenia due to antineoplastic chemotherapy (HCC)   Diarrhea   CKD (chronic kidney disease), stage III   Myeloma ([place])    Time spent: 94 minutes    [place] NP [facility]  If 7PM-7AM, please contact night-coverage at [email or web address], password [facility] 06/23/2019, 10:03 AM  LOS: 1 day

## 2019-06-23 NOTE — ED Notes (Signed)
Breakfast tray ordered 

## 2019-06-24 DIAGNOSIS — R634 Abnormal weight loss: Secondary | ICD-10-CM

## 2019-06-24 DIAGNOSIS — J189 Pneumonia, unspecified organism: Principal | ICD-10-CM

## 2019-06-24 DIAGNOSIS — E43 Unspecified severe protein-calorie malnutrition: Secondary | ICD-10-CM | POA: Insufficient documentation

## 2019-06-24 DIAGNOSIS — R05 Cough: Secondary | ICD-10-CM

## 2019-06-24 DIAGNOSIS — C9000 Multiple myeloma not having achieved remission: Secondary | ICD-10-CM

## 2019-06-24 DIAGNOSIS — D72829 Elevated white blood cell count, unspecified: Secondary | ICD-10-CM

## 2019-06-24 DIAGNOSIS — R5383 Other fatigue: Secondary | ICD-10-CM

## 2019-06-24 DIAGNOSIS — R42 Dizziness and giddiness: Secondary | ICD-10-CM

## 2019-06-24 LAB — CBC
HCT: 26.4 % — ABNORMAL LOW (ref 39.0–52.0)
Hemoglobin: 9.1 g/dL — ABNORMAL LOW (ref 13.0–17.0)
MCH: 31.2 pg (ref 26.0–34.0)
MCHC: 34.5 g/dL (ref 30.0–36.0)
MCV: 90.4 fL (ref 80.0–100.0)
Platelets: 183 K/uL (ref 150–400)
RBC: 2.92 MIL/uL — ABNORMAL LOW (ref 4.22–5.81)
RDW: 14.6 % (ref 11.5–15.5)
WBC: 2 K/uL — ABNORMAL LOW (ref 4.0–10.5)
nRBC: 0 % (ref 0.0–0.2)

## 2019-06-24 LAB — BASIC METABOLIC PANEL
Anion gap: 10 (ref 5–15)
BUN: 21 mg/dL (ref 8–23)
CO2: 16 mmol/L — ABNORMAL LOW (ref 22–32)
Calcium: 8.3 mg/dL — ABNORMAL LOW (ref 8.9–10.3)
Chloride: 110 mmol/L (ref 98–111)
Creatinine, Ser: 1.45 mg/dL — ABNORMAL HIGH (ref 0.61–1.24)
GFR calc Af Amer: 50 mL/min — ABNORMAL LOW (ref >60)
GFR calc non Af Amer: 43 mL/min — ABNORMAL LOW (ref >60)
Glucose, Bld: 92 mg/dL (ref 70–99)
Potassium: 3.3 mmol/L — ABNORMAL LOW (ref 3.5–5.1)
Sodium: 136 mmol/L (ref 135–145)

## 2019-06-24 LAB — MRSA PCR SCREENING: MRSA by PCR: NEGATIVE

## 2019-06-24 MED ORDER — DEXAMETHASONE SODIUM PHOSPHATE 4 MG/ML IJ SOLN
12.0000 mg | INTRAMUSCULAR | Status: DC
  Administered 2019-06-24 – 2019-06-27 (×4): 12 mg via INTRAVENOUS
  Filled 2019-06-24 (×4): qty 3

## 2019-06-24 MED ORDER — IMMUNE GLOBULIN (HUMAN) 10 GM/100ML IV SOLN
400.0000 mg/kg | INTRAVENOUS | Status: AC
  Administered 2019-06-24 – 2019-06-27 (×4): 25 g via INTRAVENOUS
  Filled 2019-06-24 (×4): qty 200

## 2019-06-24 MED ORDER — POTASSIUM CHLORIDE CRYS ER 20 MEQ PO TBCR
40.0000 meq | EXTENDED_RELEASE_TABLET | ORAL | Status: AC
  Administered 2019-06-24 (×2): 40 meq via ORAL
  Filled 2019-06-24 (×2): qty 2

## 2019-06-24 MED ORDER — AMLODIPINE BESYLATE 10 MG PO TABS
10.0000 mg | ORAL_TABLET | Freq: Every day | ORAL | Status: DC
  Administered 2019-06-25 – 2019-06-27 (×3): 10 mg via ORAL
  Filled 2019-06-24 (×3): qty 1

## 2019-06-24 MED ORDER — ADULT MULTIVITAMIN W/MINERALS CH
1.0000 | ORAL_TABLET | Freq: Every day | ORAL | Status: DC
  Administered 2019-06-24 – 2019-06-27 (×4): 1 via ORAL
  Filled 2019-06-24 (×4): qty 1

## 2019-06-24 MED ORDER — TBO-FILGRASTIM 300 MCG/0.5ML ~~LOC~~ SOSY
300.0000 ug | PREFILLED_SYRINGE | Freq: Every day | SUBCUTANEOUS | Status: DC
  Administered 2019-06-24 – 2019-06-26 (×3): 300 ug via SUBCUTANEOUS
  Filled 2019-06-24 (×4): qty 0.5

## 2019-06-24 MED ORDER — BOOST PLUS PO LIQD
237.0000 mL | Freq: Three times a day (TID) | ORAL | Status: DC
  Administered 2019-06-24 – 2019-06-26 (×4): 237 mL via ORAL
  Filled 2019-06-24 (×11): qty 237

## 2019-06-24 NOTE — Consult Note (Signed)
Referral MD  Reason for Referral: IgG lambda myeloma; right upper lobe pneumonia; immunocompromised due to leukopenia  Chief Complaint  Patient presents with  . dizziness  : I just feel very weak.  HPI: [name] is well-known to me.  Is a nice 85 year old white male.  He has IgG lambda myeloma.  He has history of recurrent myeloma.  He is close to what we would consider refractory.  Surprisingly, he has responded incredibly well to bendamustine chemotherapy.  His myeloma levels have come down by over 70%.  He has had issues with his appetite.  He is not eating that much.  He is on Marinol.  We cannot give him Megace because of past history of CVA.  He was admitted yesterday with what looks like a right upper lobe pneumonia.  Chest x-ray showed opacity in the right upper lobe.  His labs when he came in showed a white cell count of 1.7.  Hemoglobin 8.8.  Platelet count 166,000.  His sodium is 132.  Potassium 4.1.  BUN 32 creatinine 1.82.  Calcium 8.9.  Albumin 2.5.  He clearly will need Neupogen.  I am sure that his white cell count is low because of his chemotherapy.  He has little bit of a cough.  There is no obvious shortness of breath.  He is coughing up some mucus.  He has had no bleeding.  He has had no diarrhea.  He does not smoke.  He lives with his wife at assisted living.  She is debilitated by arthritis.  He has very good family support.  Overall, I would say his performance status is ECOG 2-3.   Past Medical History:  Diagnosis Date  . CKD (chronic kidney disease) 07/06/2018   CKD stage III  . Goals of care, counseling/discussion 02/04/2019  . History of radiation therapy 06/17/13-07/06/13   35 gray to upper lumbar spine  . 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 Hypertension   . Inguinal hernia   . Multiple myeloma [facility]) Sept 2010  . Multiple myeloma in relapse ([place]) 04/05/2016  . Radiation 09/26/15-10/20/15   lower thoracic spine, upper lumbar spine 30  gray  . Stroke [facility])   :  Past Surgical History:  Procedure Laterality Date  . COLONOSCOPY    . HIP PINNING,CANNULATED Right 09/01/2018   Procedure: [facility] NAIL HIP PINNING;  Surgeon: Melrose Nakayama, MD;  Location: [place];  Service: Orthopedics;  Laterality: Right;  . LOOP RECORDER INSERTION N/A 04/21/2019   Procedure: LOOP RECORDER INSERTION;  Surgeon: Evans Lance, MD;  Location: [place] CV LAB;  Service: Cardiovascular;  Laterality: N/A;  . TEE WITHOUT CARDIOVERSION N/A 10/09/2018   Procedure: TRANSESOPHAGEAL ECHOCARDIOGRAM (TEE);  Surgeon: Acie Fredrickson Wonda Cheng, MD;  Location: [facility] ENDOSCOPY;  Service: Cardiovascular;  Laterality: N/A;  . TONSILLECTOMY    :   Current Facility-Administered Medications:  .  0.9 %  sodium chloride infusion, , Intravenous, Continuous, Black, Lezlie Octave, NP, Last Rate: 75 mL/hr at 06/24/19 0518 .  acetaminophen (TYLENOL) tablet 650 mg, 650 mg, Oral, Q6H PRN **OR** acetaminophen (TYLENOL) suppository 650 mg, 650 mg, Rectal, Q6H PRN, Garba, Mohammad L, MD .  amLODipine (NORVASC) tablet 5 mg, 5 mg, Oral, Daily, Jonelle Sidle, Mohammad L, MD, 5 mg at 06/23/19 1005 .  aspirin EC tablet 81 mg, 81 mg, Oral, Daily, Jonelle Sidle, Mohammad L, MD, 81 mg at 06/23/19 1005 .  benazepril (LOTENSIN) tablet 10 mg, 10 mg, Oral, Daily, Jonelle Sidle, Mohammad L, MD, 10 mg at 06/23/19 1006 .  ceFEPIme (MAXIPIME) 2 g in sodium chloride 0.9 % 100 mL IVPB, 2 g, Intravenous, Q24H, Henri Medal, RPH, Last Rate: 200 mL/hr at 06/23/19 2139, 2 g at 06/23/19 2139 .  dexamethasone (DECADRON) injection 12 mg, 12 mg, Intravenous, Q24H, Ennever, Rudell Cobb, MD .  dronabinol (MARINOL) capsule 5 mg, 5 mg, Oral, BID AC, Jonelle Sidle, Mohammad L, MD, 5 mg at 06/23/19 1639 .  finasteride (PROSCAR) tablet 5 mg, 5 mg, Oral, Daily, Garba, Mohammad L, MD, 5 mg at 06/23/19 1006 .  heparin injection 5,000 Units, 5,000 Units, Subcutaneous, Q8H, Elwyn Reach, MD, 5,000 Units at 06/24/19 0612 .  Immune Globulin 10% (PRIVIGEN) IV  infusion 25 g, 400 mg/kg, Intravenous, Q24H, Ennever, Rudell Cobb, MD .  lactobacillus acidophilus (BACID) tablet, , Oral, Daily, Elwyn Reach, MD, 1 tablet at 06/23/19 1006 .  LORazepam (ATIVAN) tablet 0.5 mg, 0.5 mg, Oral, Q6H PRN, Jonelle Sidle, Mohammad L, MD .  ondansetron (ZOFRAN) tablet 4 mg, 4 mg, Oral, Q6H PRN **OR** ondansetron (ZOFRAN) injection 4 mg, 4 mg, Intravenous, Q6H PRN, Garba, Mohammad L, MD .  ondansetron (ZOFRAN) tablet 8 mg, 8 mg, Oral, BID PRN, Jonelle Sidle, Mohammad L, MD .  polyvinyl alcohol (LIQUIFILM TEARS) 1.4 % ophthalmic solution 1 drop, 1 drop, Both Eyes, PRN, Jonelle Sidle, Mohammad L, MD .  prochlorperazine (COMPAZINE) tablet 10 mg, 10 mg, Oral, Q6H PRN, Jonelle Sidle, Mohammad L, MD .  pyridOXINE (VITAMIN B-6) tablet 100 mg, 100 mg, Oral, Daily, Jonelle Sidle, Mohammad L, MD, 100 mg at 06/23/19 1005 .  traMADol (ULTRAM) tablet 50 mg, 50 mg, Oral, Q8H PRN, Jonelle Sidle, Mohammad L, MD .  vancomycin (VANCOCIN) IVPB 1000 mg/200 mL premix, 1,000 mg, Intravenous, Q36H, Masters, Jake Church, RPH .  vitamin B-12 (CYANOCOBALAMIN) tablet 500 mcg, 500 mcg, Oral, Daily, Jonelle Sidle, Mohammad L, MD, 500 mcg at 06/23/19 1005 .  [START ON 06/27/2019] Vitamin D (Ergocalciferol) (DRISDOL) capsule 50,000 Units, 50,000 Units, Oral, Q Sun, Garba, Mohammad L, MD:  . amLODipine  5 mg Oral Daily  . aspirin EC  81 mg Oral Daily  . benazepril  10 mg Oral Daily  . dexamethasone  12 mg Intravenous Q24H  . dronabinol  5 mg Oral BID AC  . finasteride  5 mg Oral Daily  . heparin  5,000 Units Subcutaneous Q8H  . lactobacillus acidophilus   Oral Daily  . pyridOXINE  100 mg Oral Daily  . vitamin B-12  500 mcg Oral Daily  . [START ON 06/27/2019] Vitamin D (Ergocalciferol)  50,000 Units Oral Q Sun  :  Allergies  Allergen Reactions  . Sulfa Antibiotics Itching  . Sulfasalazine Itching  :  Family History  Problem Relation Age of Onset  . Hypertension Mother   . Hypertension Father   :  Social History   Socioeconomic History  .  Marital status: Married    Spouse name: Christian Burns  . Number of children: Not on file  . Years of education: Not on file  . Highest education level: Not on file  Occupational History  . Occupation: retired  Scientific laboratory technician  . Financial resource strain: Not hard at all  . Food insecurity    Worry: Never true    Inability: Never true  . Transportation needs    Medical: No    Non-medical: No  Tobacco Use  . Smoking status: Never Smoker  . Smokeless tobacco: Never Used  . Tobacco comment: never used tobacco  Substance and Sexual Activity  . Alcohol use: Yes    Alcohol/week:  4.0 standard drinks    Types: 4 Glasses of wine per week  . Drug use: No  . Sexual activity: Not on file  Lifestyle  . Physical activity    Days per week: Not on file    Minutes per session: Not on file  . Stress: Not on file  Relationships  . Social connections    Talks on phone: More than three times a week    Gets together: More than three times a week    Attends religious service: Not on file    Active member of club or organization: Not on file    Attends meetings of clubs or organizations: Not on file    Relationship status: Married  . Intimate partner violence    Fear of current or ex partner: Not on file    Emotionally abused: Not on file    Physically abused: Not on file    Forced sexual activity: Not on file  Other Topics Concern  . Not on file  Social History Narrative   Mr Accardo is retired and lives at [employer] at [address] retirement apartment with his wife, Christian Burns. They have assist from their son, Christian Burns and grandson, Christian Burns. He is independent/assist with his care needs His son and/or grandson assists with transportation to medical appointments  :  .Review of Systems  Constitutional: Positive for malaise/fatigue and weight loss.  HENT: Negative.   Eyes: Negative.   Respiratory: Negative.   Cardiovascular: Negative.   Gastrointestinal: Negative.   Genitourinary: Negative.   Musculoskeletal:  Negative.   Skin: Negative.   Neurological: Negative.   Endo/Heme/Allergies: Negative.   Psychiatric/Behavioral: Negative.      Exam: As above Patient Vitals for the past 24 hrs:  BP Temp Temp src Pulse Resp SpO2  06/24/19 0539 (!) 145/74 98.5 F (36.9 C) Oral 83 18 -  06/23/19 2159 (!) 154/80 98.4 F (36.9 C) Oral 83 16 93 %  06/23/19 1546 (!) 158/77 98.8 F (37.1 C) Oral 87 18 96 %  06/23/19 1200 135/66 - - 85 (!) 22 93 %  06/23/19 1100 133/63 - - 82 15 91 %  06/23/19 1000 140/73 - - 87 (!) 24 97 %  06/23/19 0930 132/66 - - - (!) 27 -  06/23/19 0900 136/65 - - - (!) 29 -  06/23/19 0830 (!) 156/73 - - - (!) 22 -  06/23/19 0800 129/72 - - - (!) 21 -  06/23/19 0730 135/64 - - - (!) 26 -  06/23/19 0700 136/75 - - 88 19 95 %     Recent Labs    06/23/19 0350 06/24/19 0511  WBC 1.7* 2.0*  HGB 8.9* 9.1*  HCT 27.1* 26.4*  PLT 172 183   Recent Labs    06/22/19 1614 06/22/19 2310 06/23/19 0350  NA 132*  --  136  K 4.1  --  3.7  CL 105  --  110  CO2 18*  --  17*  GLUCOSE 127*  --  95  BUN 32*  --  27*  CREATININE 1.82* 1.58* 1.49*  CALCIUM 8.9  --  8.3*    Blood smear review: None  Pathology: None    Assessment and Plan: Mr. Mould is a nice 85 year old white male.  He has recurrent IgG lambda myeloma.  Again, he is responding nicely.  He is neutropenic, likely from the chemotherapy.  [name] has pneumonia.  We will give him Neupogen.  He is also going to need IVIG.  His immune system clearly is compromised from the myeloma even though he is responding.  Hopefully, we can do someone about his appetite.  He probably needs to get nutrition and to see him if they have not seen him already.  His goal is clearly just to be able to help take care of his poor wife.  So far, he has been able to do this.  I am just amazed that he has responded to the bendamustine therapy.  Again, his myeloma levels and from now by about 70-75% which is remarkable.  I suspect that he  probably will be in the hospital over the weekend.  I appreciate all the great care that he is getting from everybody up on 14M.  Lattie Haw, MD  1 Chronicles 16:11-12

## 2019-06-24 NOTE — Progress Notes (Signed)
Initial Nutrition Assessment  DOCUMENTATION CODES:   Severe malnutrition in context of chronic illness  INTERVENTION:    Boost Plus chocolate TID- Each supplement provides 360kcal and 14g protein.    Carnation Instant Breakfast PO BID, each supplement provides 220 kcal and 13 grams of protein.   MVI daily   NUTRITION DIAGNOSIS:   Severe Malnutrition related to chronic illness, cancer and cancer related treatments as evidenced by energy intake < or equal to 75% for > or equal to 1 month, percent weight loss.  GOAL:   Patient will meet greater than or equal to 90% of their needs  MONITOR:   PO intake, Supplement acceptance, Weight trends, Labs, I & O's  REASON FOR ASSESSMENT:   Consult Assessment of nutrition requirement/status  ASSESSMENT:   Patient with PMH significant for multiple myeloma s/p radiation on chemotherapy, CKD III, CVA, hypercalcemia, and HTN. Presents this admission with HCAP.   RD working remotely.  Spoke with pt via phone. Reports having poor appetite for 3-4 weeks due to increasing mouth pain upon eating. 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 Denies taste changes associated with chemotherapy. Reports he is provided with three meals at assisted living but during this time he could only finish ~25% of each. Drinks carnation instant breakfast in the morning. Currently on marinol. Discussed the importance of protein intake for preservation of lean body mass. Pt willing to try higher protein/kcal supplement until appetite progresses.   Pt endorses a UBW of 140-145 lb and a recent weight loss of 10 lb within the last month. Records indicate pt weighed 148 lb on 9/3 and 138 lb this admission (6.8% wt loss in two months, significant for time frame).   I/O: +3,413 ml since admit  UOP: 800 ml x 24 hrs   Medications: decadron, marinol, 40 mEq KCl BID, Vitamin B6, Vitamin B12, Vitamin D Labs: K 3.3 Cr  1.45- trending down   Diet Order:   Diet Order            Diet Heart Room service appropriate? Yes; Fluid consistency: Thin  Diet effective now              EDUCATION NEEDS:   Education needs have been addressed  Skin:  Skin Assessment: Reviewed RN Assessment  Last BM:  11/10  Height:   Ht Readings from Last 1 Encounters:  06/22/19 _0  (1.778 m)    Weight:   Wt Readings from Last 1 Encounters:  06/22/19 62.6 kg    Ideal Body Weight:  75.5 kg  BMI:  Body mass index is 19.8 kg/m.  Estimated Nutritional Needs:   Kcal:  1900-2100 kcal  Protein:  95-115 grams  Fluid:  >/= 1.9 L/day   Mariana Single RD, LDN Clinical Nutrition Pager # - [phone]

## 2019-06-24 NOTE — Plan of Care (Signed)
  Problem: Activity: Goal: Risk for activity intolerance will decrease Outcome: Progressing   

## 2019-06-24 NOTE — Progress Notes (Signed)
PROGRESS NOTE    Christian Burns  DVV:[id number] DOB: 23-May-1930 DOA: 06/22/2019 PCP: Leanna Battles, MD    Brief Narrative:  Christian Burns is a 85 y.o. male with medical history significant of multiple myeloma, chronic kidney disease stage III, CVA, hypercalcemia, hypertension, history of radiation therapy who is currently on chemotherapy for his myeloma.  At home patient felt dizzy and almost passed out son therefore brought him to the ER for further evaluation.  In the ED found with pneumonia and Covid screen 19 was negative.    Consultants:   Hematology/oncology  Procedures: none  Antimicrobials:   Cefepime and vancomycin   Subjective: Patient reports feeling weak however feels better overall than when he first came in.  His breathing is better.  No chest pain, chills, fever or any other complaints.  No diarrhea  Objective: Vitals:   06/24/19 1201 06/24/19 1309 06/24/19 1318 06/24/19 1352  BP: (!) 159/90 (!) 155/90 140/80 (!) 144/81  Pulse: 84 83 83 80  Resp:      Temp:      TempSrc:      SpO2: 94%  95% 96%  Weight:      Height:        Intake/Output Summary (Last 24 hours) at 06/24/2019 1530 Last data filed at 06/24/2019 1507 Gross per 24 hour  Intake 2496.86 ml  Output 900 ml  Net 1596.86 ml   Filed Weights   06/22/19 1620  Weight: 62.6 kg    Examination:  General exam: Appears calm and comfortable nontachypneic Respiratory system: Clear to auscultation. Respiratory effort normal. Cardiovascular system: S1 & S2 heard, RRR. No JVD, murmurs, rubs, gallops or clicks. No pedal edema. Gastrointestinal system: Abdomen is nondistended, soft and nontender.  Normal bowel sounds heard. Central nervous system: Alert and oriented. No focal neurological deficits. Extremities: no edema Skin: Warm dry Psychiatry: Judgement and insight appear normal. Mood & affect appropriate.     Data Reviwed: I have personally reviewed following labs and imaging studies  CBC:  Recent Labs  Lab 06/22/19 1607 06/22/19 2310 06/23/19 0350 06/24/19 0511  WBC 1.7* 1.5* 1.7* 2.0*  HGB 8.8* 9.0* 8.9* 9.1*  HCT 26.7* 27.4* 27.1* 26.4*  MCV 93.7 94.8 93.4 90.4  PLT 166 194 172 626   Basic Metabolic Panel: Recent Labs  Lab 06/22/19 1614 06/22/19 2310 06/23/19 0350 06/24/19 0511  NA 132*  --  136 136  K 4.1  --  3.7 3.3*  CL 105  --  110 110  CO2 18*  --  17* 16*  GLUCOSE 127*  --  95 92  BUN 32*  --  27* 21  CREATININE 1.82* 1.58* 1.49* 1.45*  CALCIUM 8.9  --  8.3* 8.3*   GFR: Estimated Creatinine Clearance: 31.8 mL/min (A) (by C-G formula based on SCr of 1.45 mg/dL (H)). Liver Function Tests: Recent Labs  Lab 06/22/19 1614 06/23/19 0350  AST 28 25  ALT 16 15  ALKPHOS 62 55  BILITOT 0.6 0.7  PROT 5.3* 4.7*  ALBUMIN 2.5* 2.2*   No results for input(s): LIPASE, AMYLASE in the last 168 hours. No results for input(s): AMMONIA in the last 168 hours. Coagulation Profile: No results for input(s): INR, PROTIME in the last 168 hours. Cardiac Enzymes: No results for input(s): CKTOTAL, CKMB, CKMBINDEX, TROPONINI in the last 168 hours. BNP (last 3 results) No results for input(s): PROBNP in the last 8760 hours. HbA1C: No results for input(s): HGBA1C in the last 72 hours. CBG: Recent  Labs  Lab 06/22/19 1607  GLUCAP 121*   Lipid Profile: No results for input(s): CHOL, HDL, LDLCALC, TRIG, CHOLHDL, LDLDIRECT in the last 72 hours. Thyroid Function Tests: No results for input(s): TSH, T4TOTAL, FREET4, T3FREE, THYROIDAB in the last 72 hours. Anemia Panel: No results for input(s): VITAMINB12, FOLATE, FERRITIN, TIBC, IRON, RETICCTPCT in the last 72 hours. Sepsis Labs: No results for input(s): PROCALCITON, LATICACIDVEN in the last 168 hours.  Recent Results (from the past 240 hour(s))  SARS CORONAVIRUS 2 (TAT 6-24 HRS) Nasopharyngeal Nasopharyngeal Swab     Status: None   Collection Time: 06/22/19  4:47 PM   Specimen: Nasopharyngeal Swab  Result  Value Ref Range Status   SARS Coronavirus 2 NEGATIVE NEGATIVE Final    Comment: (NOTE) SARS-CoV-2 target nucleic acids are NOT DETECTED. The SARS-CoV-2 RNA is generally detectable in upper and lower respiratory specimens during the acute phase of infection. Negative results do not preclude SARS-CoV-2 infection, do not rule out co-infections with other pathogens, and should not be used as the sole basis for treatment or other patient management decisions. Negative results must be combined with clinical observations, patient history, and epidemiological information. The expected result is Negative. Fact Sheet for Patients: SugarRoll.be Fact Sheet for Healthcare Providers: https://www.woods-mathews.com/ This test is not yet approved or cleared by the [place] FDA and  has been authorized for detection and/or diagnosis of SARS-CoV-2 by FDA under an Emergency Use Authorization (EUA). This EUA will remain  in effect (meaning this test can be used) for the duration of the COVID-19 declaration under Section 56 4(b)(1) of the Act, 21 U.S.C. section 360bbb-3(b)(1), unless the authorization is terminated or revoked sooner. Performed at [facility], [place] [address], [place], [place] [address]   MRSA PCR Screening     Status: None   Collection Time: 06/23/19  9:13 PM   Specimen: Nasal Mucosa; Nasopharyngeal  Result Value Ref Range Status   MRSA by PCR NEGATIVE NEGATIVE Final    Comment:        The GeneXpert MRSA Assay (FDA approved for NASAL specimens only), is one component of a comprehensive MRSA colonization surveillance program. It is not intended to diagnose MRSA infection nor to guide or monitor treatment for MRSA infections. Performed at [facility], [place] [address] [place], [place] [address]          Radiology Studies: Dg Chest Port 1 View  Result Date: 06/22/2019 CLINICAL DATA:  Cough and weakness EXAM:  PORTABLE CHEST 1 VIEW COMPARISON:  04/19/2019 FINDINGS: Vague opacity in the right upper lobe. Patchy airspace opacity at the bases. Normal heart size. Aortic atherosclerosis. No pneumothorax. IMPRESSION: Vague foci of airspace disease in the right upper lobe and lung bases, possible multifocal pneumonia. Electronically Signed   By: Donavan Foil M.D.   On: 06/22/2019 17:23        Scheduled Meds: . amLODipine  5 mg Oral Daily  . aspirin EC  81 mg Oral Daily  . benazepril  10 mg Oral Daily  . dexamethasone  12 mg Intravenous Q24H  . dronabinol  5 mg Oral BID AC  . finasteride  5 mg Oral Daily  . heparin  5,000 Units Subcutaneous Q8H  . lactobacillus acidophilus   Oral Daily  . lactose free nutrition  237 mL Oral TID WC  . multivitamin with minerals  1 tablet Oral Daily  . pyridOXINE  100 mg Oral Daily  . Tbo-Filgrastim  300 mcg Subcutaneous q1800  .  vitamin B-12  500 mcg Oral Daily  . [START ON 06/27/2019] Vitamin D (Ergocalciferol)  50,000 Units Oral Q Sun   Continuous Infusions: . sodium chloride 50 mL/hr at 06/24/19 0803  . ceFEPime (MAXIPIME) IV 2 g (06/23/19 2139)  . IMMUNE GLOBULIN 10% (HUMAN) IV - For Fluid Restriction Only 25 g (06/24/19 1054)  . vancomycin 1,000 mg (06/24/19 1448)    Assessment & Plan:   Principal Problem:   HCAP (healthcare-associated pneumonia) Active Problems:   Myeloma ([place])   Iron deficiency anemia secondary to inadequate dietary iron intake   Acute osteomyelitis of jaw   Essential hypertension   Leukopenia due to antineoplastic chemotherapy (HCC)   Nausea   Diarrhea   CKD (chronic kidney disease), stage III   Protein-calorie malnutrition, severe   #1 Healthcare associated pneumonia: COVID-19 screen is negative.  -continue IV Vanco and cefepime. -MRSA by PCR negative -incentive spirometry   #2 leukopenia: Secondary to chemo for his multiple myeloma.  We will continue with neutropenic precaution  Oncology started patient on  Neupogen Monitor labs  #3  IgG Lambda  myeloma: Oncology following.  Started on IVIG Per oncology responded well to bendamustine therapy   #4 generalized debility:Most likely secondary to acute infection in setting of chronic nausea/decreased oral intake and weight loss related to current chemo. -PT consult  #5 hypertension:  Not well controlled Continue home meds amlodipine and benazepril, will increase amlodipine to 10 mg daily Continue to monitor   #6 history of osteomyelitis: Patient chronicallyon penicillin. We will hold it while on treatment for the pneumonia. Resume therapy at discharge  #7. Diarrhea.  Complained of intermittent frequent soft stools over last 2 weeks. Confirmed by wife. No abdominal pain.  Likely related to chemo and contributing to #4.  -stool studies pending -Currently resolved  #8. Ckd. III. Improved with hydration.  Near baseline.  Continue to monitor -hold nephrotoxins as able.     DVT prophylaxis: Heparin Code Status: Full Family Communication: No one at bedside Disposition Plan: We will likely be here 1-2 more days, will obtain PT to see home versus rehab if necessary       LOS: 2 days   Time spent: 45 minutes with more than 50% COC    Nolberto Hanlon, MD [facility] Pager [phone]  If 7PM-7AM, please contact night-coverage [email or web address] Password [email or web address] 06/24/2019, 3:30 PM

## 2019-06-24 NOTE — Evaluation (Signed)
Clinical/Bedside Swallow Evaluation Patient Details  Name: Christian Burns MRN: [id number] Date of Birth: 1930/06/29  Today's Date: 06/24/2019 Time: SLP Start Time (ACUTE ONLY): 0845 SLP Stop Time (ACUTE ONLY): 0920 SLP Time Calculation (min) (ACUTE ONLY): 35 min  Past Medical History:  Past Medical History:  Diagnosis Date  . CKD (chronic kidney disease) 07/06/2018   CKD stage III  . Goals of care, counseling/discussion 02/04/2019  . History of radiation therapy 06/17/13-07/06/13   35 gray to upper lumbar spine  . 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 Hypertension   . Inguinal hernia   . Multiple myeloma [facility]) Sept 2010  . Multiple myeloma in relapse ([place]) 04/05/2016  . Radiation 09/26/15-10/20/15   lower thoracic spine, upper lumbar spine 30 gray  . Stroke [place])    Past Surgical History:  Past Surgical History:  Procedure Laterality Date  . COLONOSCOPY    . HIP PINNING,CANNULATED Right 09/01/2018   Procedure: [facility] NAIL HIP PINNING;  Surgeon: Melrose Nakayama, MD;  Location: [facility];  Service: Orthopedics;  Laterality: Right;  . LOOP RECORDER INSERTION N/A 04/21/2019   Procedure: LOOP RECORDER INSERTION;  Surgeon: Evans Lance, MD;  Location: [place] [facility];  Service: Cardiovascular;  Laterality: N/A;  . TEE WITHOUT CARDIOVERSION N/A 10/09/2018   Procedure: TRANSESOPHAGEAL ECHOCARDIOGRAM (TEE);  Surgeon: Acie Fredrickson Wonda Cheng, MD;  Location: [facility] ENDOSCOPY;  Service: Cardiovascular;  Laterality: N/A;  . TONSILLECTOMY     HPI:  Mr Christian Burns, 89y/m, presented to ED with weakness found to have bilateral infiltrates concerning for pneumonia with known immunosuppression from his chemotherapy. PMH of multiple myeloma, CDK state III, CVA (04/2019), hypercalcemia, hypertension, s/p radiation and chemotherapy, reduced appetite.    Assessment / Plan / Recommendation Clinical Impression  Clinical swallow evaluation completed. Pt found to have mild oral dysphagia likely as a result of  stroke and complications of multiple myeloma. He reports problems with pocketing foods on the left side since his stroke 04/2019. Pt chews foods on the left side due to severe sensitivity on the right side (related to myeloma per odontist) and as a result of reduce sensitivity and reduced bolus awareness from stroke, he pockets foods on left side. His swallow trigger was normal with no s/sx of aspiration. Vocal quality and respiratory status were unchanged throughout trials. Pt reports historically being a slow eater, but now is a very slow eater and desiring food less in a response. Recommend Regular diet with chopped meats, thin liquids. Medication whole in puree. Compensatory strategies to include tongue sweep for pocketing and liquid wash for oral clearance. ST to follow up for diet tolerance and compensatory strategy training.  SLP Visit Diagnosis: Dysphagia, oral phase (R13.11)    Aspiration Risk  Mild aspiration risk    Diet Recommendation Regular;Thin liquid(chopped meats)   Liquid Administration via: Cup;Straw Medication Administration: Whole meds with puree Supervision: Patient able to self feed Compensations: Slow rate;Small sips/bites Postural Changes: Seated upright at 90 degrees    Other  Recommendations Oral Care Recommendations: Oral care QID   Follow up Recommendations Outpatient SLP      Frequency and Duration min 1 x/week  1 week       Prognosis Prognosis for Safe Diet Advancement: Good Barriers to Reach Goals: Severity of deficits      Swallow Study   General Date of Onset: 06/22/19 HPI: Mr Christian Burns, 89y/m, presented to ED with weakness found to have bilateral infiltrates concerning for pneumonia with known immunosuppression from his chemotherapy.  PMH of multiple myeloma, CDK state III, CVA (04/2019), hypercalcemia, hypertension, s/p radiation and chemotherapy, reduced appetite.  Type of Study: Bedside Swallow Evaluation Previous Swallow Assessment: none Diet  Prior to this Study: Regular;Thin liquids Temperature Spikes Noted: No Respiratory Status: Room air History of Recent Intubation: No Behavior/Cognition: Alert;Cooperative;Pleasant mood Oral Cavity Assessment: Within Functional Limits Oral Care Completed by SLP: No Oral Cavity - Dentition: Adequate natural dentition;Missing dentition Vision: Functional for self-feeding Self-Feeding Abilities: Able to feed self Patient Positioning: Upright in bed Baseline Vocal Quality: Normal Volitional Cough: Strong Volitional Swallow: Able to elicit    Oral/Motor/Sensory Function Overall Oral Motor/Sensory Function: Mild impairment(chews only on the left side due to sensitivity) Facial ROM: Within Functional Limits Facial Symmetry: Within Functional Limits Facial Strength: Within Functional Limits Facial Sensation: Reduced left(impaired awareness of bolus, pocketing on left side) Lingual ROM: Within Functional Limits Lingual Symmetry: Within Functional Limits Lingual Strength: Within Functional Limits Lingual Sensation: Reduced(left) Mandible: Within Functional Limits   Ice Chips Ice chips: Within functional limits Presentation: Spoon   Thin Liquid Thin Liquid: Within functional limits    Nectar Thick Nectar Thick Liquid: Not tested   Honey Thick Honey Thick Liquid: Not tested   Puree Puree: Within functional limits   Solid     Solid: Impaired Presentation: Self Fed Oral Phase Impairments: Poor awareness of bolus Oral Phase Functional Implications: Left lateral sulci pocketing     Wynelle Bourgeois, MA CCC-SLP 06/24/2019,9:46 AM

## 2019-06-25 LAB — CBC WITH DIFFERENTIAL/PLATELET
Abs Immature Granulocytes: 0.8 10*3/uL — ABNORMAL HIGH (ref 0.00–0.07)
Basophils Absolute: 0 10*3/uL (ref 0.0–0.1)
Basophils Relative: 0 %
Eosinophils Absolute: 0 10*3/uL (ref 0.0–0.5)
Eosinophils Relative: 0 %
HCT: 27 % — ABNORMAL LOW (ref 39.0–52.0)
Hemoglobin: 9.5 g/dL — ABNORMAL LOW (ref 13.0–17.0)
Immature Granulocytes: 17 %
Lymphocytes Relative: 2 %
Lymphs Abs: 0.1 10*3/uL — ABNORMAL LOW (ref 0.7–4.0)
MCH: 31.6 pg (ref 26.0–34.0)
MCHC: 35.2 g/dL (ref 30.0–36.0)
MCV: 89.7 fL (ref 80.0–100.0)
Monocytes Absolute: 1.2 10*3/uL — ABNORMAL HIGH (ref 0.1–1.0)
Monocytes Relative: 26 %
Neutro Abs: 2.6 10*3/uL (ref 1.7–7.7)
Neutrophils Relative %: 55 %
Platelets: 197 10*3/uL (ref 150–400)
RBC: 3.01 MIL/uL — ABNORMAL LOW (ref 4.22–5.81)
RDW: 14.6 % (ref 11.5–15.5)
WBC: 4.8 10*3/uL (ref 4.0–10.5)
nRBC: 0 % (ref 0.0–0.2)

## 2019-06-25 LAB — COMPREHENSIVE METABOLIC PANEL
ALT: 14 U/L (ref 0–44)
AST: 28 U/L (ref 15–41)
Albumin: 1.9 g/dL — ABNORMAL LOW (ref 3.5–5.0)
Alkaline Phosphatase: 51 U/L (ref 38–126)
Anion gap: 8 (ref 5–15)
BUN: 23 mg/dL (ref 8–23)
CO2: 15 mmol/L — ABNORMAL LOW (ref 22–32)
Calcium: 8.5 mg/dL — ABNORMAL LOW (ref 8.9–10.3)
Chloride: 110 mmol/L (ref 98–111)
Creatinine, Ser: 1.47 mg/dL — ABNORMAL HIGH (ref 0.61–1.24)
GFR calc Af Amer: 49 mL/min — ABNORMAL LOW (ref >60)
GFR calc non Af Amer: 42 mL/min — ABNORMAL LOW (ref >60)
Glucose, Bld: 116 mg/dL — ABNORMAL HIGH (ref 70–99)
Potassium: 4.2 mmol/L (ref 3.5–5.1)
Sodium: 133 mmol/L — ABNORMAL LOW (ref 135–145)
Total Bilirubin: 0.8 mg/dL (ref 0.3–1.2)
Total Protein: 5.1 g/dL — ABNORMAL LOW (ref 6.5–8.1)

## 2019-06-25 MED ORDER — SODIUM CHLORIDE 0.9 % IV SOLN
INTRAVENOUS | Status: DC | PRN
  Administered 2019-06-25 – 2019-06-26 (×2): 250 mL via INTRAVENOUS

## 2019-06-25 MED ORDER — SODIUM CHLORIDE 0.9 % IV SOLN
2.0000 g | Freq: Two times a day (BID) | INTRAVENOUS | Status: DC
  Administered 2019-06-25 – 2019-06-27 (×5): 2 g via INTRAVENOUS
  Filled 2019-06-25 (×6): qty 2

## 2019-06-25 NOTE — Progress Notes (Signed)
PROGRESS NOTE    Christian Burns  CNO:[id number] DOB: 04-25-1930 DOA: 06/22/2019 PCP: Leanna Battles, MD    Brief Narrative:  Christian Burns a 85 y.o.malewith medical history significant ofmultiple myeloma, chronic kidney disease stage III, CVA, hypercalcemia, hypertension, history of radiation therapy who is currently on chemotherapy for his myeloma.  At home patient felt dizzy and almost passed out son therefore brought him to the ER for further evaluation.  In the ED found with pneumonia and Covid screen 19 was negative.    Consultants:   Hematology/oncology  Procedures: none  Antimicrobials:   Cefepime and vancomycin   Subjective: Patient feels better today.  Reports breathing is also better.  Son at bedside they have no new complaints.  He is feeling a little weak and is not ready to go home yet.  Denies chest pain, fever, chills, diarrhea.   Objective: Vitals:   06/25/19 1206 06/25/19 1259 06/25/19 1411 06/25/19 1613  BP: 139/74 (!) 142/82 140/84 134/79  Pulse: 73 83 85 81  Resp: 16   18  Temp: 97.8 F (36.6 C) 97.6 F (36.4 C) (!) 97.4 F (36.3 C) 97.9 F (36.6 C)  TempSrc: Oral Oral Oral Oral  SpO2: 98% 99% 96% 99%  Weight:      Height:        Intake/Output Summary (Last 24 hours) at 06/25/2019 1848 Last data filed at 06/25/2019 1300 Gross per 24 hour  Intake 621.9 ml  Output 800 ml  Net -178.1 ml   Filed Weights   06/22/19 1620  Weight: 62.6 kg    Examination: General exam: Appears calm and comfortable nontachypneic Respiratory system: Clear to auscultation. Respiratory effort normal. Cardiovascular system: S1 & S2 heard, RRR. No  murmurs, rubs, gallops or clicks.  Gastrointestinal system: Abdomen is nondistended, soft and nontender.  Normal bowel sounds heard. Central nervous system: Alert and oriented. No focal neurological deficits. Extremities: no edema Skin: Warm dry Psychiatry: Judgement and insight appear normal. Mood &  affect appropriate.      Data Reviewed: I have personally reviewed following labs and imaging studies  CBC: Recent Labs  Lab 06/22/19 1607 06/22/19 2310 06/23/19 0350 06/24/19 0511 06/25/19 0526  WBC 1.7* 1.5* 1.7* 2.0* 4.8  NEUTROABS  --   --   --   --  2.6  HGB 8.8* 9.0* 8.9* 9.1* 9.5*  HCT 26.7* 27.4* 27.1* 26.4* 27.0*  MCV 93.7 94.8 93.4 90.4 89.7  PLT 166 194 172 183 294   Basic Metabolic Panel: Recent Labs  Lab 06/22/19 1614 06/22/19 2310 06/23/19 0350 06/24/19 0511 06/25/19 0526  NA 132*  --  136 136 133*  K 4.1  --  3.7 3.3* 4.2  CL 105  --  110 110 110  CO2 18*  --  17* 16* 15*  GLUCOSE 127*  --  95 92 116*  BUN 32*  --  27* 21 23  CREATININE 1.82* 1.58* 1.49* 1.45* 1.47*  CALCIUM 8.9  --  8.3* 8.3* 8.5*   GFR: Estimated Creatinine Clearance: 31.3 mL/min (A) (by C-G formula based on SCr of 1.47 mg/dL (H)). Liver Function Tests: Recent Labs  Lab 06/22/19 1614 06/23/19 0350 06/25/19 0526  AST 28 25 28   ALT 16 15 14   ALKPHOS 62 55 51  BILITOT 0.6 0.7 0.8  PROT 5.3* 4.7* 5.1*  ALBUMIN 2.5* 2.2* 1.9*   No results for input(s): LIPASE, AMYLASE in the last 168 hours. No results for input(s): AMMONIA in the last 168 hours.  Coagulation Profile: No results for input(s): INR, PROTIME in the last 168 hours. Cardiac Enzymes: No results for input(s): CKTOTAL, CKMB, CKMBINDEX, TROPONINI in the last 168 hours. BNP (last 3 results) No results for input(s): PROBNP in the last 8760 hours. HbA1C: No results for input(s): HGBA1C in the last 72 hours. CBG: Recent Labs  Lab 06/22/19 1607  GLUCAP 121*   Lipid Profile: No results for input(s): CHOL, HDL, LDLCALC, TRIG, CHOLHDL, LDLDIRECT in the last 72 hours. Thyroid Function Tests: No results for input(s): TSH, T4TOTAL, FREET4, T3FREE, THYROIDAB in the last 72 hours. Anemia Panel: No results for input(s): VITAMINB12, FOLATE, FERRITIN, TIBC, IRON, RETICCTPCT in the last 72 hours. Sepsis Labs: No results  for input(s): PROCALCITON, LATICACIDVEN in the last 168 hours.  Recent Results (from the past 240 hour(s))  SARS CORONAVIRUS 2 (TAT 6-24 HRS) Nasopharyngeal Nasopharyngeal Swab     Status: None   Collection Time: 06/22/19  4:47 PM   Specimen: Nasopharyngeal Swab  Result Value Ref Range Status   SARS Coronavirus 2 NEGATIVE NEGATIVE Final    Comment: (NOTE) SARS-CoV-2 target nucleic acids are NOT DETECTED. The SARS-CoV-2 RNA is generally detectable in upper and lower respiratory specimens during the acute phase of infection. Negative results do not preclude SARS-CoV-2 infection, do not rule out co-infections with other pathogens, and should not be used as the sole basis for treatment or other patient management decisions. Negative results must be combined with clinical observations, patient history, and epidemiological information. The expected result is Negative. Fact Sheet for Patients: SugarRoll.be Fact Sheet for Healthcare Providers: https://www.woods-mathews.com/ This test is not yet approved or cleared by the [place] FDA and  has been authorized for detection and/or diagnosis of SARS-CoV-2 by FDA under an Emergency Use Authorization (EUA). This EUA will remain  in effect (meaning this test can be used) for the duration of the COVID-19 declaration under Section 56 4(b)(1) of the Act, 21 U.S.C. section 360bbb-3(b)(1), unless the authorization is terminated or revoked sooner. Performed at [facility], [place] [address] [place], [place] [address]   MRSA PCR Screening     Status: None   Collection Time: 06/23/19  9:13 PM   Specimen: Nasal Mucosa; Nasopharyngeal  Result Value Ref Range Status   MRSA by PCR NEGATIVE NEGATIVE Final    Comment:        The GeneXpert MRSA Assay (FDA approved for NASAL specimens only), is one component of a comprehensive MRSA colonization surveillance program. It is not intended to diagnose MRSA  infection nor to guide or monitor treatment for MRSA infections. Performed at [facility], [place] [address], [place],  [address]          Radiology Studies: No results found.      Scheduled Meds: . amLODipine  10 mg Oral Daily  . aspirin EC  81 mg Oral Daily  . benazepril  10 mg Oral Daily  . dexamethasone  12 mg Intravenous Q24H  . dronabinol  5 mg Oral BID AC  . finasteride  5 mg Oral Daily  . heparin  5,000 Units Subcutaneous Q8H  . lactobacillus acidophilus   Oral Daily  . lactose free nutrition  237 mL Oral TID WC  . multivitamin with minerals  1 tablet Oral Daily  . pyridOXINE  100 mg Oral Daily  . Tbo-Filgrastim  300 mcg Subcutaneous q1800  . vitamin B-12  500 mcg Oral Daily  . [START ON 06/27/2019] Vitamin D (Ergocalciferol)  50,000 Units Oral  Q Sun   Continuous Infusions: . ceFEPime (MAXIPIME) IV 2 g (06/25/19 0954)  . IMMUNE GLOBULIN 10% (HUMAN) IV - For Fluid Restriction Only 25 g (06/25/19 1127)    Assessment & Plan:   Principal Problem:   HCAP (healthcare-associated pneumonia) Active Problems:   Myeloma ([place])   Iron deficiency anemia secondary to inadequate dietary iron intake   Acute osteomyelitis of jaw   Essential hypertension   Leukopenia due to antineoplastic chemotherapy (HCC)   Nausea   Diarrhea   CKD (chronic kidney disease), stage III   Protein-calorie malnutrition, severe   #1 Healthcare associated pneumonia: COVID-19 screen is negative. -We will DC vancomycin since MRSA by PCR is negative  -Continue on IV cefepime. -MRSA by PCR negative -incentive spirometry   #2 leukopenia: Secondary to chemo for his multiple myeloma.  We will continue with neutropenic precaution  Was given Neupogen and WBC up now, on plans to give 1 more dose today and then stop.   #3  IgG Lambda  myeloma: Oncology following.  Started on IVIG Per oncology responded well to bendamustine therapy   #4 generalized debility:Most  likely secondary to acute infectionin setting of chronic nausea/decreased oral intake and weight loss related to current chemo. -PT consult  #5 hypertension: Labile but improving Continue home meds amlodipine and benazepril     #6 history of osteomyelitis: Patient chronicallyon penicillin. We will hold it while on treatment for the pneumonia. Resume therapy at discharge  #7. Diarrhea.  Complained of intermittent frequent soft stools over last 2 weeks. Confirmed by wife. No abdominal pain.  Likely related to chemo and contributing to #4.  Resolved.  #8. Ckd. III. Improved with hydration.   Appears to be at baseline -hold nephrotoxins as able.     DVT prophylaxis: Heparin Code Status: Full Family Communication: No one at bedside Disposition Plan: PT rec. Home PT, supervision for mobility/OOB     LOS: 3 days   Time spent: 45 minutes with more than 50% COC    Nolberto Hanlon, MD [facility] Pager [phone]  If 7PM-7AM, please contact night-coverage [email or web address] Password [facility] 06/25/2019, 6:48 PM

## 2019-06-25 NOTE — Progress Notes (Signed)
Looks like Mr. Lyga is feeling better.  He seems more alert.  He sounds stronger.  We have on IVIG.  We have him on some steroids.  His mouth does look a little dry.  He probably needs some mouth rinse to try to help with his mouth dryness.  His white cell count is up to 4.8.  He got 1 dose of Neupogen.  I with a 1 more dose today and then we can probably stop the Neupogen.  He did have a bowel movement.  There is no problems with shortness of breath.  It sounds like his cough might be a little bit better.  His labs shows BUN to be 23 creatinine 1.47.  His calcium is 8.5 with an albumin of 1.9.  His white cell count is 4.8.  Hemoglobin is 9.5.  There is no problems with pain.  He does have a history of osteomyelitis of the jaw.  He had been on penicillin for this.  He is having little bit of discomfort over on the right side of his jaw.  His vital signs all look stable.  Blood pressure is 130/70.  Temperature 98.1.  Pulses 76.  His lungs sound clear bilaterally.  He has good breath sounds bilaterally.  Cardiac exam regular rate and rhythm.  Oral exam does show dry oromucosa.  Abdomen is soft.  Bowel sounds are present.  I think he needs physical therapy.  He really needs strengthening.  Hopefully, some physical therapy will help.  I am grateful for nutritional therapy to have seen him.  Hopefully, they can optimize his caloric intake.  I think steroids are probably helping quite a bit.  I know that he is getting fantastic care from all the staff up on [address].  Pete Ennever  Colossians 3:17

## 2019-06-25 NOTE — Progress Notes (Signed)
Pharmacy Antibiotic Note  Christian Burns is a 85 y.o. male admitted on 06/22/2019 with dizziness.  Pharmacy has been consulted for vancomycin and cefepime dosing for pneumonia. Chest xray on 11/10 found possible multifocal pneumonia. WBC 1.7. Afebrile. Scr 1.82>>1.47 with current CrCl of 31.3 ml/min. Baseline Scr is approximately 1.4 to 1.5.   MRSA PCR is negative.   Plan: Increase cefepime to 2g IV q12h  D/C vancomycin as MRSA PCR is negative Monitor renal function, cultures/sensitivites, and clinical progression daily   Height: 5\' 10"  (177.8 cm) Weight: 138 lb (62.6 kg) IBW/kg (Calculated) : 73  Temp (24hrs), Avg:98.1 F (36.7 C), Min:97.6 F (36.4 C), Max:98.4 F (36.9 C)  Recent Labs  Lab 06/22/19 1607 06/22/19 1614 06/22/19 2310 06/23/19 0350 06/24/19 0511 06/25/19 0526  WBC 1.7*  --  1.5* 1.7* 2.0* 4.8  CREATININE  --  1.82* 1.58* 1.49* 1.45* 1.47*    Estimated Creatinine Clearance: 31.3 mL/min (A) (by C-G formula based on SCr of 1.47 mg/dL (H)).    Allergies  Allergen Reactions  . Sulfa Antibiotics Itching  . Sulfasalazine Itching    Antimicrobials this admission: Vancomycin 11/10 >>11/13 Cefepime 11/10 >>  Dose adjustments this admission: N/A  Microbiology results: 11/9 COVID-19: ordered 11/11 MRSA PCR negative   Pavel Gadd A. Levada Dy, PharmD, BCPS, FNKF Clinical Pharmacist [place] Please utilize Amion for appropriate phone number to reach the unit pharmacist ([place])    06/25/2019 8:10 AM

## 2019-06-25 NOTE — Care Management Important Message (Signed)
Important Message  Patient Details  Name: Christian Burns MRN: [id number] Date of Birth: June 19, 1930   Medicare Important Message Given:  Yes     [name] 06/25/2019, 2:45 PM

## 2019-06-25 NOTE — Plan of Care (Signed)
  Problem: Education: Goal: Knowledge of General Education information will improve Description: Including pain rating scale, medication(s)/side effects and non-pharmacologic comfort measures Outcome: Completed/Met   Problem: Health Behavior/Discharge Planning: Goal: Ability to manage health-related needs will improve Outcome: Completed/Met   Problem: Clinical Measurements: Goal: Diagnostic test results will improve Outcome: Completed/Met Goal: Cardiovascular complication will be avoided Outcome: Completed/Met   Problem: Coping: Goal: Level of anxiety will decrease Outcome: Completed/Met   Problem: Elimination: Goal: Will not experience complications related to bowel motility Outcome: Completed/Met Goal: Will not experience complications related to urinary retention Outcome: Completed/Met   Problem: Pain Managment: Goal: General experience of comfort will improve Outcome: Completed/Met   Problem: Skin Integrity: Goal: Risk for impaired skin integrity will decrease Outcome: Completed/Met   Problem: Clinical Measurements: Goal: Ability to maintain a body temperature in the normal range will improve Outcome: Completed/Met   Problem: Respiratory: Goal: Ability to maintain a clear airway will improve Outcome: Completed/Met

## 2019-06-25 NOTE — Evaluation (Signed)
Physical Therapy Evaluation Patient Details Name: Christian Burns MRN: [id number] DOB: [birth year] Today's Date: 06/25/2019   History of Present Illness  Christian Burns is a 85 y.o. male with medical history significant of multiple myeloma, chronic kidney disease stage III, CVA, hypercalcemia, hypertension, history of radiation therapy who is currently on chemotherapy for his myeloma.  Patient has been feeling weak and debilitated over the past week.  He is lost his appetite.  He is generally not able to move around as he usually does    Clinical Impression  Pt taking meds when I entered and got choked on the meds - educated in safety with this, not talking while coughing, and using inspirometer and coughing up phelgm.  When pt got OOB - had to go to [facility] and had loose stools and condom cath came off and had to clean floor and pt etc.  Once pt ready to walk - he was already tired.  Pt walked in the room with RW and then able to march 2 minutes in place before needing to sit.  Pt wants to DC home with wife and HH. He has ability to hire additional help (help is now hired to care for wife as he is not there with her).  I agree with plan - I told him to hire help at least for the first week he is home.  Pt to continue with HHPT at [facility] who is familiar with him.  PT will follow pt while in the hospital.    Follow Up Recommendations Home health PT;Supervision for mobility/OOB    Equipment Recommendations  None recommended by PT    Recommendations for Other Services       Precautions / Restrictions Precautions Precautions: Fall Precaution Comments: pt denies history of any recent falls Restrictions Weight Bearing Restrictions: No      Mobility  Bed Mobility Overal bed mobility: Needs Assistance Bed Mobility: Supine to Sit     Supine to sit: Supervision     General bed mobility comments: pt needed cues to sit up (and help with positioning of foley cath)  Transfers Overall transfer  level: Needs assistance Equipment used: Rolling walker (2 wheeled) Transfers: Sit to/from [facility] Sit to Stand: Min guard Stand pivot transfers: Min guard       General transfer comment: pt needed cues for hand placement  Ambulation/Gait Ambulation/Gait assistance: Min guard Gait Distance (Feet): 30 Feet(then pt marched in place for 2 minutes before sitting) Assistive device: Rolling walker (2 wheeled)       General Gait Details: pt had to get to [facility] when first up - condom cath came off when urinating and had to clean up floor and legs etc. p t getting weak through his experience and lots of sitting.  pt abel to stand and walk to chair in room with Rw and min guard - he reports initially a little weak but felt strnger the more he was up. I had pt march in plce at chair before sitting - did it for 2 minutes before needing to sit.  encouraged this everytime when up if feeling dizzy - to help get his strenght back safely in his room.  Stairs            Wheelchair Mobility    Modified Rankin (Stroke Patients Only)       Balance Overall balance assessment: Mild deficits observed, not formally tested  Pertinent Vitals/Pain Pain Assessment: No/denies pain    Home Living Family/patient expects to be discharged to:: Private residence Living Arrangements: Spouse/significant other Available Help at Discharge: Family;Personal care attendant;Available PRN/intermittently Type of Home: Independent living facility       Home Layout: One level Home Equipment: Bedside commode;Walker - 2 wheels;Cane - single point;Grab bars - tub/shower;Shower seat;Grab bars - toilet Additional Comments: lives with wife in [facility]    Prior Function Level of Independence: Independent with assistive device(s)         Comments: pt has felt weaker lately. he reports he has had [place] PT and has graduated from it.  He  is weaker now than he was     Hand Dominance        Extremity/Trunk Assessment        Lower Extremity Assessment Lower Extremity Assessment: Generalized weakness(right leg weaker than left - sitting 20 degrees from full extension - both legs)    Cervical / Trunk Assessment Cervical / Trunk Assessment: Kyphotic  Communication   Communication: No difficulties  Cognition Arousal/Alertness: Awake/alert Behavior During Therapy: WFL for tasks assessed/performed Overall Cognitive Status: Within Functional Limits for tasks assessed                                 General Comments: pt cooperative and pleasant during PT eval      General Comments General comments (skin integrity, edema, etc.): pt stands with RW - tends to stand on heels with weight posterior and kyphotic posture - needs skilled PT to help him get improved balance.  Pt took meds piror to getting up - started coughing and spent lots of time coughing up and catching his breath.  Pt given an inspirometer and educated on use.  Pt able to cough up phelgm and felt better.  encouraged upright sitting to eat and drink, not talking when coughing and using inspirometer BID - and more when gurgly.    Exercises     Assessment/Plan    PT Assessment Patient needs continued PT services  PT Problem List Decreased strength;Decreased mobility;Decreased safety awareness;Decreased activity tolerance;Cardiopulmonary status limiting activity;Decreased balance;Decreased knowledge of use of DME       PT Treatment Interventions DME instruction;Therapeutic activities;Gait training;Therapeutic exercise;Patient/family education;Balance training;Functional mobility training    PT Goals (Current goals can be found in the Care Plan section)  Acute Rehab PT Goals Patient Stated Goal: to get home with wife PT Goal Formulation: With patient Time For Goal Achievement: 07/09/19 Potential to Achieve Goals: Good    Frequency Min  3X/week   Barriers to discharge Decreased caregiver support pt said wife unable to assist at home but can hire help. he says they have hired help to help wife while in hospital so easy to do    Co-evaluation               AM-PAC PT "6 Clicks" Mobility  Outcome Measure Help needed turning from your back to your side while in a flat bed without using bedrails?: A Little Help needed moving from lying on your back to sitting on the side of a flat bed without using bedrails?: A Little Help needed moving to and from a bed to a chair (including a wheelchair)?: A Little Help needed standing up from a chair using your arms (e.g., wheelchair or bedside chair)?: A Little Help needed to walk in hospital room?: A Little Help needed climbing 3-5  steps with a railing? : A Lot 6 Click Score: 17    End of Session   Activity Tolerance: Patient tolerated treatment well;Patient limited by fatigue Patient left: in chair;with call bell/phone within reach;with nursing/sitter in room;with chair alarm set Nurse Communication: Mobility status;Precautions PT Visit Diagnosis: Unsteadiness on feet (R26.81);Muscle weakness (generalized) (M62.81)    Time: 3557-3220 PT Time Calculation (min) (ACUTE ONLY): 55 min   Charges:   PT Evaluation $PT Eval Moderate Complexity: 1 Mod PT Treatments $Gait Training: 8-22 mins $Therapeutic Activity: 8-22 mins        06/25/2019   Rande Lawman, PT   Loyal Buba 06/25/2019, 11:02 AM

## 2019-06-26 LAB — CBC WITH DIFFERENTIAL/PLATELET
Abs Immature Granulocytes: 0.89 10*3/uL — ABNORMAL HIGH (ref 0.00–0.07)
Basophils Absolute: 0 10*3/uL (ref 0.0–0.1)
Basophils Relative: 0 %
Eosinophils Absolute: 0 10*3/uL (ref 0.0–0.5)
Eosinophils Relative: 0 %
HCT: 24.6 % — ABNORMAL LOW (ref 39.0–52.0)
Hemoglobin: 8.5 g/dL — ABNORMAL LOW (ref 13.0–17.0)
Immature Granulocytes: 14 %
Lymphocytes Relative: 2 %
Lymphs Abs: 0.1 10*3/uL — ABNORMAL LOW (ref 0.7–4.0)
MCH: 31 pg (ref 26.0–34.0)
MCHC: 34.6 g/dL (ref 30.0–36.0)
MCV: 89.8 fL (ref 80.0–100.0)
Monocytes Absolute: 1.4 10*3/uL — ABNORMAL HIGH (ref 0.1–1.0)
Monocytes Relative: 23 %
Neutro Abs: 3.8 10*3/uL (ref 1.7–7.7)
Neutrophils Relative %: 61 %
Platelets: 185 10*3/uL (ref 150–400)
RBC: 2.74 MIL/uL — ABNORMAL LOW (ref 4.22–5.81)
RDW: 14.9 % (ref 11.5–15.5)
WBC: 6.2 10*3/uL (ref 4.0–10.5)
nRBC: 0 % (ref 0.0–0.2)

## 2019-06-26 LAB — COMPREHENSIVE METABOLIC PANEL
ALT: 14 U/L (ref 0–44)
AST: 30 U/L (ref 15–41)
Albumin: 2 g/dL — ABNORMAL LOW (ref 3.5–5.0)
Alkaline Phosphatase: 49 U/L (ref 38–126)
Anion gap: 10 (ref 5–15)
BUN: 31 mg/dL — ABNORMAL HIGH (ref 8–23)
CO2: 17 mmol/L — ABNORMAL LOW (ref 22–32)
Calcium: 9 mg/dL (ref 8.9–10.3)
Chloride: 108 mmol/L (ref 98–111)
Creatinine, Ser: 1.47 mg/dL — ABNORMAL HIGH (ref 0.61–1.24)
GFR calc Af Amer: 49 mL/min — ABNORMAL LOW (ref >60)
GFR calc non Af Amer: 42 mL/min — ABNORMAL LOW (ref >60)
Glucose, Bld: 109 mg/dL — ABNORMAL HIGH (ref 70–99)
Potassium: 4.2 mmol/L (ref 3.5–5.1)
Sodium: 135 mmol/L (ref 135–145)
Total Bilirubin: 0.7 mg/dL (ref 0.3–1.2)
Total Protein: 5.4 g/dL — ABNORMAL LOW (ref 6.5–8.1)

## 2019-06-26 NOTE — Plan of Care (Signed)
  Problem: Activity: Goal: Risk for activity intolerance will decrease Outcome: Progressing   

## 2019-06-26 NOTE — Progress Notes (Signed)
PROGRESS NOTE    Christian Burns  TXM:[id number] DOB: Mar 11, 1930 DOA: 06/22/2019 PCP: Leanna Battles, MD    Brief Narrative:  Christian Burns a 85 y.o.malewith medical history significant ofmultiple myeloma, chronic kidney disease stage III, CVA, hypercalcemia, hypertension, history of radiation therapy who is currently on chemotherapy for his myeloma.At home patient felt dizzy and almost passed out son therefore brought him to the ER for further evaluation. In the ED found with pneumonia and Covid screen 19 was negative.    Consultants:  Hematology/oncology  Procedures:none  Antimicrobials:  Cefepime and vancomycin   Subjective: Patient reports doing well today.  Shortness of breath has improved.  Denies fever, chills, diarrhea, abdominal pain nausea or vomiting.  Objective: Vitals:   06/26/19 0921 06/26/19 0921 06/26/19 1052 06/26/19 1259  BP: (!) 147/80 (!) 147/80 (!) 142/78 138/72  Pulse:  87 88 89  Resp:  18 16 18   Temp:   97.8 F (36.6 C) 97.8 F (36.6 C)  TempSrc:   Oral Oral  SpO2:  96% 96% 98%  Weight:      Height:        Intake/Output Summary (Last 24 hours) at 06/26/2019 1312 Last data filed at 06/26/2019 0830 Gross per 24 hour  Intake 513.31 ml  Output 1175 ml  Net -661.69 ml   Filed Weights   06/22/19 1620  Weight: 62.6 kg    Examination: General exam:Appears calm and comfortable , NAD, sitting in bed. Looks better Respiratory system: Clear to auscultation. Respiratory effort normal. Cardiovascular system:S1 &S2 heard, RRR. No  murmurs, rubs, gallops or clicks.  Gastrointestinal system:Abdomen is nondistended, soft and nontender. Normal bowel sounds heard. Central nervous system:Alert and oriented. No focal neurological deficits. Extremities:no edema Skin:Warm dry Psychiatry:Judgement and insight appear normal. Mood &affect appropriate.     Data Reviewed: I have personally reviewed following labs and imaging  studies  CBC: Recent Labs  Lab 06/22/19 2310 06/23/19 0350 06/24/19 0511 06/25/19 0526 06/26/19 0500  WBC 1.5* 1.7* 2.0* 4.8 6.2  NEUTROABS  --   --   --  2.6 3.8  HGB 9.0* 8.9* 9.1* 9.5* 8.5*  HCT 27.4* 27.1* 26.4* 27.0* 24.6*  MCV 94.8 93.4 90.4 89.7 89.8  PLT 194 172 183 197 482   Basic Metabolic Panel: Recent Labs  Lab 06/22/19 1614 06/22/19 2310 06/23/19 0350 06/24/19 0511 06/25/19 0526 06/26/19 0500  NA 132*  --  136 136 133* 135  K 4.1  --  3.7 3.3* 4.2 4.2  CL 105  --  110 110 110 108  CO2 18*  --  17* 16* 15* 17*  GLUCOSE 127*  --  95 92 116* 109*  BUN 32*  --  27* 21 23 31*  CREATININE 1.82* 1.58* 1.49* 1.45* 1.47* 1.47*  CALCIUM 8.9  --  8.3* 8.3* 8.5* 9.0   GFR: Estimated Creatinine Clearance: 31.3 mL/min (A) (by C-G formula based on SCr of 1.47 mg/dL (H)). Liver Function Tests: Recent Labs  Lab 06/22/19 1614 06/23/19 0350 06/25/19 0526 06/26/19 0500  AST 28 25 28 30   ALT 16 15 14 14   ALKPHOS 62 55 51 49  BILITOT 0.6 0.7 0.8 0.7  PROT 5.3* 4.7* 5.1* 5.4*  ALBUMIN 2.5* 2.2* 1.9* 2.0*   No results for input(s): LIPASE, AMYLASE in the last 168 hours. No results for input(s): AMMONIA in the last 168 hours. Coagulation Profile: No results for input(s): INR, PROTIME in the last 168 hours. Cardiac Enzymes: No results for input(s): CKTOTAL, CKMB,  CKMBINDEX, TROPONINI in the last 168 hours. BNP (last 3 results) No results for input(s): PROBNP in the last 8760 hours. HbA1C: No results for input(s): HGBA1C in the last 72 hours. CBG: Recent Labs  Lab 06/22/19 1607  GLUCAP 121*   Lipid Profile: No results for input(s): CHOL, HDL, LDLCALC, TRIG, CHOLHDL, LDLDIRECT in the last 72 hours. Thyroid Function Tests: No results for input(s): TSH, T4TOTAL, FREET4, T3FREE, THYROIDAB in the last 72 hours. Anemia Panel: No results for input(s): VITAMINB12, FOLATE, FERRITIN, TIBC, IRON, RETICCTPCT in the last 72 hours. Sepsis Labs: No results for input(s):  PROCALCITON, LATICACIDVEN in the last 168 hours.  Recent Results (from the past 240 hour(s))  SARS CORONAVIRUS 2 (TAT 6-24 HRS) Nasopharyngeal Nasopharyngeal Swab     Status: None   Collection Time: 06/22/19  4:47 PM   Specimen: Nasopharyngeal Swab  Result Value Ref Range Status   SARS Coronavirus 2 NEGATIVE NEGATIVE Final    Comment: (NOTE) SARS-CoV-2 target nucleic acids are NOT DETECTED. The SARS-CoV-2 RNA is generally detectable in upper and lower respiratory specimens during the acute phase of infection. Negative results do not preclude SARS-CoV-2 infection, do not rule out co-infections with other pathogens, and should not be used as the sole basis for treatment or other patient management decisions. Negative results must be combined with clinical observations, patient history, and epidemiological information. The expected result is Negative. Fact Sheet for Patients: SugarRoll.be Fact Sheet for Healthcare Providers: https://www.woods-mathews.com/ This test is not yet approved or cleared by the [place] FDA and  has been authorized for detection and/or diagnosis of SARS-CoV-2 by FDA under an Emergency Use Authorization (EUA). This EUA will remain  in effect (meaning this test can be used) for the duration of the COVID-19 declaration under Section 56 4(b)(1) of the Act, 21 U.S.C. section 360bbb-3(b)(1), unless the authorization is terminated or revoked sooner. Performed at [facility], [place] [address] [place], [place] [address]   MRSA PCR Screening     Status: None   Collection Time: 06/23/19  9:13 PM   Specimen: Nasal Mucosa; Nasopharyngeal  Result Value Ref Range Status   MRSA by PCR NEGATIVE NEGATIVE Final    Comment:        The GeneXpert MRSA Assay (FDA approved for NASAL specimens only), is one component of a comprehensive MRSA colonization surveillance program. It is not intended to diagnose MRSA infection nor  to guide or monitor treatment for MRSA infections. Performed at [facility], [place] [address], [place], [place] [address]          Radiology Studies: No results found.      Scheduled Meds: . amLODipine  10 mg Oral Daily  . aspirin EC  81 mg Oral Daily  . benazepril  10 mg Oral Daily  . dexamethasone  12 mg Intravenous Q24H  . dronabinol  5 mg Oral BID AC  . finasteride  5 mg Oral Daily  . heparin  5,000 Units Subcutaneous Q8H  . lactobacillus acidophilus   Oral Daily  . lactose free nutrition  237 mL Oral TID WC  . multivitamin with minerals  1 tablet Oral Daily  . pyridOXINE  100 mg Oral Daily  . Tbo-Filgrastim  300 mcg Subcutaneous q1800  . vitamin B-12  500 mcg Oral Daily  . [START ON 06/27/2019] Vitamin D (Ergocalciferol)  50,000 Units Oral Q Sun   Continuous Infusions: . sodium chloride Stopped (06/26/19 0005)  . ceFEPime (MAXIPIME) IV 2 g (06/26/19 0924)  .  IMMUNE GLOBULIN 10% (HUMAN) IV - For Fluid Restriction Only 25 g (06/26/19 1040)    Assessment & Plan:   Principal Problem:   HCAP (healthcare-associated pneumonia) Active Problems:   Myeloma ([place])   Iron deficiency anemia secondary to inadequate dietary iron intake   Acute osteomyelitis of jaw   Essential hypertension   Leukopenia due to antineoplastic chemotherapy (HCC)   Nausea   Diarrhea   CKD (chronic kidney disease), stage III   Protein-calorie malnutrition, severe   #1 Healthcare associated pneumonia: COVID-19 screen is negative. -vancomycin d/c'd  since MRSA by PCR is negative  -Continue on IV cefepime today -incentive spirometry   #2 leukopenia: Secondary tochemo for his multiple myeloma.  continue with neutropenic precaution Was given Neupogen x 2 with improvement of wbc Hematology/onc following  #3IgGLambdamyeloma: Oncology following.  Per oncology responded well tobendamustine therapy Receiving IVIG x4 days, last dose in am.    #4 generalized  debility:Most likely secondary to acute infectionin setting of chronic nausea/decreased oral intake and weight loss related to current chemo. -PTrecommends home health PT;Supervision for mobility/OOB   #5 hypertension: Improved and stable Continue home medsamlodipine and benazepril    #6 history of osteomyelitis: Patient chronicallyon penicillin. Penicillin held for now while receiving treatment for his pneumonia can resume therapy at discharge once completes antibiotic course    #7. Diarrhea.  Complained of intermittent frequent soft stools over last 2 weeks. Confirmed by wife. No abdominal pain. Likely related to chemo and contributing to #4.  Resolved now  #8. Ckd. III. Improved with hydration.  Appears to be at baseline -hold nephrotoxins as able.    DVT prophylaxis:Heparin Code Status:Full Family Communication:No one at bedside Disposition Plan:DC in a.m. after completing IVIG        LOS: 4 days   Time spent: 45 minutes with more than 50% on [place], MD [facility] Pager [phone]  If 7PM-7AM, please contact night-coverage [email or web address] Password [email or web address] 06/26/2019, 1:12 PM

## 2019-06-26 NOTE — Plan of Care (Signed)
  Problem: Nutrition: Goal: Adequate nutrition will be maintained Outcome: Progressing   

## 2019-06-27 LAB — COMPREHENSIVE METABOLIC PANEL
ALT: 19 U/L (ref 0–44)
AST: 38 U/L (ref 15–41)
Albumin: 1.9 g/dL — ABNORMAL LOW (ref 3.5–5.0)
Alkaline Phosphatase: 51 U/L (ref 38–126)
Anion gap: 8 (ref 5–15)
BUN: 34 mg/dL — ABNORMAL HIGH (ref 8–23)
CO2: 19 mmol/L — ABNORMAL LOW (ref 22–32)
Calcium: 9.2 mg/dL (ref 8.9–10.3)
Chloride: 109 mmol/L (ref 98–111)
Creatinine, Ser: 1.48 mg/dL — ABNORMAL HIGH (ref 0.61–1.24)
GFR calc Af Amer: 49 mL/min — ABNORMAL LOW (ref >60)
GFR calc non Af Amer: 42 mL/min — ABNORMAL LOW (ref >60)
Glucose, Bld: 89 mg/dL (ref 70–99)
Potassium: 3.7 mmol/L (ref 3.5–5.1)
Sodium: 136 mmol/L (ref 135–145)
Total Bilirubin: 0.8 mg/dL (ref 0.3–1.2)
Total Protein: 5.3 g/dL — ABNORMAL LOW (ref 6.5–8.1)

## 2019-06-27 LAB — CBC WITH DIFFERENTIAL/PLATELET
Abs Immature Granulocytes: 0.75 10*3/uL — ABNORMAL HIGH (ref 0.00–0.07)
Basophils Absolute: 0.1 10*3/uL (ref 0.0–0.1)
Basophils Relative: 1 %
Eosinophils Absolute: 0 10*3/uL (ref 0.0–0.5)
Eosinophils Relative: 0 %
HCT: 24.6 % — ABNORMAL LOW (ref 39.0–52.0)
Hemoglobin: 8.5 g/dL — ABNORMAL LOW (ref 13.0–17.0)
Immature Granulocytes: 8 %
Lymphocytes Relative: 1 %
Lymphs Abs: 0.1 10*3/uL — ABNORMAL LOW (ref 0.7–4.0)
MCH: 31.5 pg (ref 26.0–34.0)
MCHC: 34.6 g/dL (ref 30.0–36.0)
MCV: 91.1 fL (ref 80.0–100.0)
Monocytes Absolute: 1.7 10*3/uL — ABNORMAL HIGH (ref 0.1–1.0)
Monocytes Relative: 17 %
Neutro Abs: 7.2 10*3/uL (ref 1.7–7.7)
Neutrophils Relative %: 73 %
Platelets: 169 10*3/uL (ref 150–400)
RBC: 2.7 MIL/uL — ABNORMAL LOW (ref 4.22–5.81)
RDW: 15.1 % (ref 11.5–15.5)
WBC: 9.8 10*3/uL (ref 4.0–10.5)
nRBC: 0.3 % — ABNORMAL HIGH (ref 0.0–0.2)

## 2019-06-27 MED ORDER — ADULT MULTIVITAMIN W/MINERALS CH: 1.0000 | ORAL_TABLET | Freq: Every day | ORAL | 0 refills | Status: DC

## 2019-06-27 MED ORDER — AMOXICILLIN-POT CLAVULANATE 875-125 MG PO TABS: 1.0000 | ORAL_TABLET | Freq: Two times a day (BID) | ORAL | Status: DC

## 2019-06-27 MED ORDER — AMOXICILLIN-POT CLAVULANATE 875-125 MG PO TABS: 1.0000 | ORAL_TABLET | Freq: Two times a day (BID) | ORAL | 0 refills | Status: DC

## 2019-06-27 NOTE — Plan of Care (Signed)
  Problem: Clinical Measurements: Goal: Ability to maintain clinical measurements within normal limits will improve Outcome: Progressing   

## 2019-06-27 NOTE — Plan of Care (Signed)
  Problem: Activity: Goal: Risk for activity intolerance will decrease Outcome: Progressing   

## 2019-06-27 NOTE — Discharge Summary (Signed)
Christian Burns [id number]:[id number] DOB: 1929/10/12 DOA: 06/22/2019  PCP: Leanna Battles, MD  Admit date: 06/22/2019 Discharge date: 06/27/2019  Admitted From: Home Disposition: Home  Recommendations for Outpatient Follow-up:  1. Follow up with PCP in 1 week 2. Please obtain BMP/CBC in one week 3. Please follow up on the following pending results:  Home Health:yes   Discharge Condition:Stable CODE STATUS: Full  Diet recommendation: Heart Healthy   Brief/Interim Summary: Christian Burns a 85 y.o.malewith medical history significant ofmultiple myeloma, chronic kidney disease stage III, CVA, hypercalcemia, hypertension, history of radiation therapy who is currently on chemotherapy for his myeloma.At home patient felt dizzy and almost passed out son therefore brought him to the ER for further evaluation. In the ED found with pneumonia and Covid screen 19 was negative.Oncology was consulted for his multiple myeloma.  He was started on IVIG and Neupogen for his leukopenia with improvement of his WBCs.  He was started on IV antibiotics and has been doing well.  Also received physical therapy and they recommended home PT.   He was kept in-house until he completed his 4 days of IVIG.  Discharge Diagnoses:  Principal Problem:   HCAP (healthcare-associated pneumonia) Active Problems:   Myeloma ([place])   Iron deficiency anemia secondary to inadequate dietary iron intake   Acute osteomyelitis of jaw   Essential hypertension   Leukopenia due to antineoplastic chemotherapy (HCC)   Nausea   Diarrhea   CKD (chronic kidney disease), stage III   Protein-calorie malnutrition, severe    Discharge Instructions  Discharge Instructions    Call MD for:  difficulty breathing, headache or visual disturbances   Complete by: As directed    Call MD for:  temperature >100.4   Complete by: As directed    Diet - low sodium heart healthy   Complete by: As directed    Discharge instructions   Complete  by: As directed    Follow up with pcp in 1-5 days  Follow up with oncology as scheduled Take your penicillin pills after finishing Augmentin .   Increase activity slowly   Complete by: As directed      Allergies as of 06/27/2019      Reactions   Sulfa Antibiotics Itching   Sulfasalazine Itching      Medication List    TAKE these medications   amLODipine-benazepril 5-10 MG capsule Commonly known as: LOTREL TAKE ONE CAPSULE EACH DAY What changed: See the new instructions.   amoxicillin-clavulanate 875-125 MG tablet Commonly known as: AUGMENTIN Take 1 tablet by mouth every 12 (twelve) hours.   aspirin 81 MG EC tablet Take 1 tablet (81 mg total) by mouth daily.   dextromethorphan-guaiFENesin 30-600 MG 12hr tablet Commonly known as: MUCINEX DM Take 1 tablet by mouth 2 (two) times daily as needed for cough.   dronabinol 5 MG capsule Commonly known as: MARINOL TAKE ONE CAPSULE TWICE DAILY BEFORE MEALS What changed: See the new instructions.   finasteride 5 MG tablet Commonly known as: PROSCAR TAKE ONE TABLET EACH DAY What changed: See the new instructions.   lidocaine-prilocaine cream Commonly known as: EMLA Apply to affected area once   LORazepam 0.5 MG tablet Commonly known as: Ativan Take 1 tablet (0.5 mg total) by mouth every 6 (six) hours as needed (Nausea or vomiting).   megestrol 400 MG/10ML suspension Commonly known as: MEGACE Take 15 mLs (600 mg total) by mouth daily.   multivitamin with minerals Tabs tablet Take 1 tablet by mouth daily. Start taking  on: June 28, 2019   ondansetron 8 MG tablet Commonly known as: Zofran Take 1 tablet (8 mg total) by mouth 2 (two) times daily as needed for refractory nausea / vomiting. Start on day 2 after Bendamustine chemotherapy.   penicillin v potassium 500 MG tablet Commonly known as: VEETID Take 1 tablet (500 mg total) by mouth 4 (four) times daily. #120 What changed: additional instructions   polyvinyl  alcohol 1.4 % ophthalmic solution Commonly known as: LIQUIFILM TEARS Place 1 drop into both eyes as needed for dry eyes.   predniSONE 20 MG tablet Commonly known as: Deltasone Take 1 tablet (20 mg total) by mouth daily with breakfast.   PROBIOTIC PO Take 1 capsule by mouth daily.   prochlorperazine 10 MG tablet Commonly known as: COMPAZINE Take 1 tablet (10 mg total) by mouth every 6 (six) hours as needed (nausea and vomiting).   pyridOXINE 100 MG tablet Commonly known as: VITAMIN B-6 Take 100 mg by mouth daily.   traMADol 50 MG tablet Commonly known as: ULTRAM Take 1 tablet (50 mg total) by mouth every 8 (eight) hours as needed for moderate pain.   vitamin B-12 500 MCG tablet Commonly known as: CYANOCOBALAMIN Take 500 mcg by mouth daily.   Vitamin D (Ergocalciferol) 1.25 MG (50000 UT) Caps capsule Commonly known as: DRISDOL Take 50,000 Units by mouth every Sunday.       Allergies  Allergen Reactions  . Sulfa Antibiotics Itching  . Sulfasalazine Itching    Consultations:  oncology   Procedures/Studies: Dg Chest Port 1 View  Result Date: 06/22/2019 CLINICAL DATA:  Cough and weakness EXAM: PORTABLE CHEST 1 VIEW COMPARISON:  04/19/2019 FINDINGS: Vague opacity in the right upper lobe. Patchy airspace opacity at the bases. Normal heart size. Aortic atherosclerosis. No pneumothorax. IMPRESSION: Vague foci of airspace disease in the right upper lobe and lung bases, possible multifocal pneumonia. Electronically Signed   By: Donavan Foil M.D.   On: 06/22/2019 17:23       Subjective: Patient seen and examined.  Has no complaints.  Shortness of breath better.  No dizziness or lightheadedness or any other complaints.  Is receiving his last dose of IVIG today.  Discharge Exam: Vitals:   06/27/19 0543 06/27/19 1037  BP: 137/67 135/68  Pulse: 65 66  Resp: 15 16  Temp: 97.8 F (36.6 C) 97.8 F (36.6 C)  SpO2: 95% 96%   Vitals:   06/26/19 1758 06/26/19 2005  06/27/19 0543 06/27/19 1037  BP: 130/79 129/69 137/67 135/68  Pulse: 78 75 65 66  Resp: 18 16 15 16   Temp: 97.6 F (36.4 C) 98 F (36.7 C) 97.8 F (36.6 C) 97.8 F (36.6 C)  TempSrc: Oral Oral Oral Oral  SpO2: 98% 93% 95% 96%  Weight:   70.3 kg   Height:        General: Pt is alert, awake, not in acute distress Cardiovascular: RRR, S1/S2 +, no rubs, no gallops Respiratory: CTA bilaterally, no wheezing, no rhonchi Abdominal: Soft, NT, ND, bowel sounds + Extremities: no edema, no cyanosis    The results of significant diagnostics from this hospitalization (including imaging, microbiology, ancillary and laboratory) are listed below for reference.     Microbiology: Recent Results (from the past 240 hour(s))  SARS CORONAVIRUS 2 (TAT 6-24 HRS) Nasopharyngeal Nasopharyngeal Swab     Status: None   Collection Time: 06/22/19  4:47 PM   Specimen: Nasopharyngeal Swab  Result Value Ref Range Status   SARS Coronavirus 2  NEGATIVE NEGATIVE Final    Comment: (NOTE) SARS-CoV-2 target nucleic acids are NOT DETECTED. The SARS-CoV-2 RNA is generally detectable in upper and lower respiratory specimens during the acute phase of infection. Negative results do not preclude SARS-CoV-2 infection, do not rule out co-infections with other pathogens, and should not be used as the sole basis for treatment or other patient management decisions. Negative results must be combined with clinical observations, patient history, and epidemiological information. The expected result is Negative. Fact Sheet for Patients: SugarRoll.be Fact Sheet for Healthcare Providers: https://www.woods-mathews.com/ This test is not yet approved or cleared by the [place] FDA and  has been authorized for detection and/or diagnosis of SARS-CoV-2 by FDA under an Emergency Use Authorization (EUA). This EUA will remain  in effect (meaning this test can be used) for the duration of  the COVID-19 declaration under Section 56 4(b)(1) of the Act, 21 U.S.C. section 360bbb-3(b)(1), unless the authorization is terminated or revoked sooner. Performed at [facility], [place] [address] [place], [place] [address]   MRSA PCR Screening     Status: None   Collection Time: 06/23/19  9:13 PM   Specimen: Nasal Mucosa; Nasopharyngeal  Result Value Ref Range Status   MRSA by PCR NEGATIVE NEGATIVE Final    Comment:        The GeneXpert MRSA Assay (FDA approved for NASAL specimens only), is one component of a comprehensive MRSA colonization surveillance program. It is not intended to diagnose MRSA infection nor to guide or monitor treatment for MRSA infections. Performed at [facility], [place] [address], [place], [place] [address]      Labs: BNP (last 3 results) Recent Labs    10/06/18 2142  BNP 154.0*   Basic Metabolic Panel: Recent Labs  Lab 06/23/19 0350 06/24/19 0511 06/25/19 0526 06/26/19 0500 06/27/19 0505  NA 136 136 133* 135 136  K 3.7 3.3* 4.2 4.2 3.7  CL 110 110 110 108 109  CO2 17* 16* 15* 17* 19*  GLUCOSE 95 92 116* 109* 89  BUN 27* 21 23 31* 34*  CREATININE 1.49* 1.45* 1.47* 1.47* 1.48*  CALCIUM 8.3* 8.3* 8.5* 9.0 9.2   Liver Function Tests: Recent Labs  Lab 06/22/19 1614 06/23/19 0350 06/25/19 0526 06/26/19 0500 06/27/19 0505  AST 28 25 28 30  38  ALT 16 15 14 14 19   ALKPHOS 62 55 51 49 51  BILITOT 0.6 0.7 0.8 0.7 0.8  PROT 5.3* 4.7* 5.1* 5.4* 5.3*  ALBUMIN 2.5* 2.2* 1.9* 2.0* 1.9*   No results for input(s): LIPASE, AMYLASE in the last 168 hours. No results for input(s): AMMONIA in the last 168 hours. CBC: Recent Labs  Lab 06/23/19 0350 06/24/19 0511 06/25/19 0526 06/26/19 0500 06/27/19 0505  WBC 1.7* 2.0* 4.8 6.2 9.8  NEUTROABS  --   --  2.6 3.8 7.2  HGB 8.9* 9.1* 9.5* 8.5* 8.5*  HCT 27.1* 26.4* 27.0* 24.6* 24.6*  MCV 93.4 90.4 89.7 89.8 91.1  PLT 172 183 197 185 169   Cardiac Enzymes: No results for  input(s): CKTOTAL, CKMB, CKMBINDEX, TROPONINI in the last 168 hours. BNP: Invalid input(s): POCBNP CBG: Recent Labs  Lab 06/22/19 1607  GLUCAP 121*   D-Dimer No results for input(s): DDIMER in the last 72 hours. Hgb A1c No results for input(s): HGBA1C in the last 72 hours. Lipid Profile No results for input(s): CHOL, HDL, LDLCALC, TRIG, CHOLHDL, LDLDIRECT in the last 72 hours. Thyroid function studies No results for input(s): TSH,  T4TOTAL, T3FREE, THYROIDAB in the last 72 hours.  Invalid input(s): FREET3 Anemia work up No results for input(s): VITAMINB12, FOLATE, FERRITIN, TIBC, IRON, RETICCTPCT in the last 72 hours. Urinalysis    Component Value Date/Time   COLORURINE YELLOW 06/22/2019 1742   APPEARANCEUR CLEAR 06/22/2019 1742   LABSPEC 1.013 06/22/2019 1742   LABSPEC 1.020 09/17/2013 1036   PHURINE 5.0 06/22/2019 1742   GLUCOSEU NEGATIVE 06/22/2019 1742   HGBUR SMALL (A) 06/22/2019 1742   BILIRUBINUR NEGATIVE 06/22/2019 1742   KETONESUR NEGATIVE 06/22/2019 1742   PROTEINUR 100 (A) 06/22/2019 1742   UROBILINOGEN 0.2 09/17/2013 1036   NITRITE NEGATIVE 06/22/2019 1742   LEUKOCYTESUR NEGATIVE 06/22/2019 1742   Sepsis Labs Invalid input(s): PROCALCITONIN,  WBC,  LACTICIDVEN Microbiology Recent Results (from the past 240 hour(s))  SARS CORONAVIRUS 2 (TAT 6-24 HRS) Nasopharyngeal Nasopharyngeal Swab     Status: None   Collection Time: 06/22/19  4:47 PM   Specimen: Nasopharyngeal Swab  Result Value Ref Range Status   SARS Coronavirus 2 NEGATIVE NEGATIVE Final    Comment: (NOTE) SARS-CoV-2 target nucleic acids are NOT DETECTED. The SARS-CoV-2 RNA is generally detectable in upper and lower respiratory specimens during the acute phase of infection. Negative results do not preclude SARS-CoV-2 infection, do not rule out co-infections with other pathogens, and should not be used as the sole basis for treatment or other patient management decisions. Negative results must be  combined with clinical observations, patient history, and epidemiological information. The expected result is Negative. Fact Sheet for Patients: SugarRoll.be Fact Sheet for Healthcare Providers: https://www.woods-mathews.com/ This test is not yet approved or cleared by the [place] FDA and  has been authorized for detection and/or diagnosis of SARS-CoV-2 by FDA under an Emergency Use Authorization (EUA). This EUA will remain  in effect (meaning this test can be used) for the duration of the COVID-19 declaration under Section 56 4(b)(1) of the Act, 21 U.S.C. section 360bbb-3(b)(1), unless the authorization is terminated or revoked sooner. Performed at [facility], [place] [address], [place], [place] [address]   MRSA PCR Screening     Status: None   Collection Time: 06/23/19  9:13 PM   Specimen: Nasal Mucosa; Nasopharyngeal  Result Value Ref Range Status   MRSA by PCR NEGATIVE NEGATIVE Final    Comment:        The GeneXpert MRSA Assay (FDA approved for NASAL specimens only), is one component of a comprehensive MRSA colonization surveillance program. It is not intended to diagnose MRSA infection nor to guide or monitor treatment for MRSA infections. Performed at [facility], [place] [address], [place], [place] [address]      Time coordinating discharge: Over 30 minutes  SIGNED:   Nolberto Hanlon, MD  [facility] 06/27/2019, 10:56 AM Pager   If 7PM-7AM, please contact night-coverage [email or web address] Password [email or web address]

## 2019-06-27 NOTE — Progress Notes (Signed)
Christian Burns to be discharged [name] per MD order. Discussed prescriptions and follow up appointments with the patient. Prescriptions given to patient; medication list explained in detail. Patient verbalized understanding.  Skin clean, dry and intact without evidence of skin break down, no evidence of skin tears noted. IV catheter discontinued intact. Site without signs and symptoms of complications. Dressing and pressure applied. Pt denies pain at the site currently. No complaints noted.  Patient free of lines, drains, and wounds.   An After Visit Summary (AVS) was printed and given to the patient. Patient escorted via wheelchair, and discharged home via private auto by son.  Baldo Ash, RN

## 2019-06-27 NOTE — TOC Transition Note (Signed)
Transition of Care [facility]) - CM/SW Discharge Note   Patient Details  Name: Christian Burns MRN: [id number] Date of Birth: 05/27/1930  [facility] CM/SW Contact:  Carles Collet, RN Phone Number: 06/27/2019, 11:36 AM   Clinical Narrative:   Could not reach patient in room, spoke to wife on the phone. She would like to use PT through [facility]. Modified HH order to include outpatient PT as needed by [facility] and faxed to [phone]. Wife states that son is providing transport home, this information passed along to nurse. No other CM needs identified     Final next level of care: Home w Home Health Services Barriers to Discharge: No Barriers Identified   Patient Goals and CMS Choice Patient states their goals for this hospitalization and ongoing recovery are:: to go home CMS Medicare.gov Compare Post Acute Care list provided to:: Legal Guardian Choice offered to / list presented to : Spouse  Discharge Placement                       Discharge Plan and Services                                     Social Determinants of Health (SDOH) Interventions     Readmission Risk Interventions No flowsheet data found.

## 2019-06-27 NOTE — Plan of Care (Signed)
  Problem: Clinical Measurements: Goal: Ability to maintain clinical measurements within normal limits will improve Outcome: Adequate for Discharge Goal: Will remain free from infection Outcome: Adequate for Discharge Goal: Respiratory complications will improve Outcome: Adequate for Discharge   Problem: Activity: Goal: Risk for activity intolerance will decrease 06/27/2019 1450 by Baldo Ash, RN Outcome: Adequate for Discharge 06/27/2019 0816 by Baldo Ash, RN Outcome: Progressing   Problem: Nutrition: Goal: Adequate nutrition will be maintained Outcome: Adequate for Discharge   Problem: Safety: Goal: Ability to remain free from injury will improve Outcome: Adequate for Discharge   Problem: Activity: Goal: Ability to tolerate increased activity will improve Outcome: Adequate for Discharge   Problem: Respiratory: Goal: Ability to maintain adequate ventilation will improve Outcome: Adequate for Discharge   Problem: SLP Dysphagia Goals Goal: Patient will utilize recommended strategies Description: Patient will utilize recommended strategies during swallow to increase swallowing safety with Outcome: Adequate for Discharge   Problem: Malnutrition  (NI-5.2) Goal: Food and/or nutrient delivery Description: Individualized approach for food/nutrient provision. Outcome: Adequate for Discharge   Problem: Acute Rehab PT Goals(only PT should resolve) Goal: Pt Will Go Supine/Side To Sit Outcome: Adequate for Discharge Goal: Patient Will Transfer Sit To/From Stand Outcome: Adequate for Discharge Goal: Pt Will Transfer Bed To Chair/Chair To Bed Outcome: Adequate for Discharge Goal: Pt Will Ambulate Outcome: Adequate for Discharge

## 2019-06-28 ENCOUNTER — Ambulatory Visit (INDEPENDENT_AMBULATORY_CARE_PROVIDER_SITE_OTHER): Payer: Medicare HMO | Admitting: *Deleted

## 2019-06-28 DIAGNOSIS — I63412 Cerebral infarction due to embolism of left middle cerebral artery: Secondary | ICD-10-CM

## 2019-06-28 LAB — PATHOLOGIST SMEAR REVIEW

## 2019-06-29 LAB — CUP PACEART REMOTE DEVICE CHECK
Date Time Interrogation Session: 20201116170746
Implantable Pulse Generator Implant Date: 20200909

## 2019-06-30 DIAGNOSIS — R262 Difficulty in walking, not elsewhere classified: Secondary | ICD-10-CM | POA: Diagnosis not present

## 2019-06-30 DIAGNOSIS — M6281 Muscle weakness (generalized): Secondary | ICD-10-CM | POA: Diagnosis not present

## 2019-06-30 DIAGNOSIS — R2681 Unsteadiness on feet: Secondary | ICD-10-CM | POA: Diagnosis not present

## 2019-07-02 DIAGNOSIS — R2681 Unsteadiness on feet: Secondary | ICD-10-CM | POA: Diagnosis not present

## 2019-07-02 DIAGNOSIS — M6281 Muscle weakness (generalized): Secondary | ICD-10-CM | POA: Diagnosis not present

## 2019-07-02 DIAGNOSIS — R262 Difficulty in walking, not elsewhere classified: Secondary | ICD-10-CM | POA: Diagnosis not present

## 2019-07-04 DIAGNOSIS — R262 Difficulty in walking, not elsewhere classified: Secondary | ICD-10-CM | POA: Diagnosis not present

## 2019-07-04 DIAGNOSIS — M6281 Muscle weakness (generalized): Secondary | ICD-10-CM | POA: Diagnosis not present

## 2019-07-04 DIAGNOSIS — R2681 Unsteadiness on feet: Secondary | ICD-10-CM | POA: Diagnosis not present

## 2019-07-05 DIAGNOSIS — R2681 Unsteadiness on feet: Secondary | ICD-10-CM | POA: Diagnosis not present

## 2019-07-05 DIAGNOSIS — M6281 Muscle weakness (generalized): Secondary | ICD-10-CM | POA: Diagnosis not present

## 2019-07-05 DIAGNOSIS — R262 Difficulty in walking, not elsewhere classified: Secondary | ICD-10-CM | POA: Diagnosis not present

## 2019-07-06 ENCOUNTER — Inpatient Hospital Stay: Payer: Medicare HMO

## 2019-07-06 ENCOUNTER — Other Ambulatory Visit: Payer: Self-pay

## 2019-07-06 ENCOUNTER — Encounter: Payer: Self-pay | Admitting: Hematology & Oncology

## 2019-07-06 ENCOUNTER — Ambulatory Visit: Payer: Medicare HMO | Admitting: Nutrition

## 2019-07-06 ENCOUNTER — Inpatient Hospital Stay: Payer: Medicare HMO | Attending: Hematology & Oncology | Admitting: Hematology & Oncology

## 2019-07-06 VITALS — BP 180/79 | HR 66 | Temp 97.5°F | Resp 18 | Wt 141.0 lb

## 2019-07-06 DIAGNOSIS — Z923 Personal history of irradiation: Secondary | ICD-10-CM | POA: Insufficient documentation

## 2019-07-06 DIAGNOSIS — N183 Chronic kidney disease, stage 3 unspecified: Secondary | ICD-10-CM | POA: Insufficient documentation

## 2019-07-06 DIAGNOSIS — Z882 Allergy status to sulfonamides status: Secondary | ICD-10-CM | POA: Diagnosis not present

## 2019-07-06 DIAGNOSIS — C9002 Multiple myeloma in relapse: Secondary | ICD-10-CM | POA: Diagnosis present

## 2019-07-06 DIAGNOSIS — Z8673 Personal history of transient ischemic attack (TIA), and cerebral infarction without residual deficits: Secondary | ICD-10-CM | POA: Insufficient documentation

## 2019-07-06 DIAGNOSIS — C9000 Multiple myeloma not having achieved remission: Secondary | ICD-10-CM

## 2019-07-06 DIAGNOSIS — R634 Abnormal weight loss: Secondary | ICD-10-CM | POA: Diagnosis not present

## 2019-07-06 DIAGNOSIS — Z9221 Personal history of antineoplastic chemotherapy: Secondary | ICD-10-CM | POA: Insufficient documentation

## 2019-07-06 DIAGNOSIS — M791 Myalgia, unspecified site: Secondary | ICD-10-CM | POA: Diagnosis not present

## 2019-07-06 DIAGNOSIS — C9001 Multiple myeloma in remission: Secondary | ICD-10-CM | POA: Diagnosis not present

## 2019-07-06 DIAGNOSIS — D631 Anemia in chronic kidney disease: Secondary | ICD-10-CM | POA: Diagnosis not present

## 2019-07-06 DIAGNOSIS — Z79899 Other long term (current) drug therapy: Secondary | ICD-10-CM | POA: Insufficient documentation

## 2019-07-06 DIAGNOSIS — D649 Anemia, unspecified: Secondary | ICD-10-CM

## 2019-07-06 DIAGNOSIS — Z5111 Encounter for antineoplastic chemotherapy: Secondary | ICD-10-CM | POA: Diagnosis present

## 2019-07-06 DIAGNOSIS — R531 Weakness: Secondary | ICD-10-CM | POA: Insufficient documentation

## 2019-07-06 DIAGNOSIS — D508 Other iron deficiency anemias: Secondary | ICD-10-CM

## 2019-07-06 LAB — CMP (CANCER CENTER ONLY)
ALT: 15 U/L (ref 0–44)
AST: 21 U/L (ref 15–41)
Albumin: 3.3 g/dL — ABNORMAL LOW (ref 3.5–5.0)
Alkaline Phosphatase: 54 U/L (ref 38–126)
Anion gap: 6 (ref 5–15)
BUN: 32 mg/dL — ABNORMAL HIGH (ref 8–23)
CO2: 24 mmol/L (ref 22–32)
Calcium: 8.4 mg/dL — ABNORMAL LOW (ref 8.9–10.3)
Chloride: 110 mmol/L (ref 98–111)
Creatinine: 1.37 mg/dL — ABNORMAL HIGH (ref 0.61–1.24)
GFR, Est AFR Am: 53 mL/min — ABNORMAL LOW (ref >60)
GFR, Estimated: 46 mL/min — ABNORMAL LOW (ref >60)
Glucose, Bld: 86 mg/dL (ref 70–99)
Potassium: 3.4 mmol/L — ABNORMAL LOW (ref 3.5–5.1)
Sodium: 140 mmol/L (ref 135–145)
Total Bilirubin: 0.5 mg/dL (ref 0.3–1.2)
Total Protein: 6.6 g/dL (ref 6.5–8.1)

## 2019-07-06 LAB — SAMPLE TO BLOOD BANK

## 2019-07-06 LAB — CBC WITH DIFFERENTIAL (CANCER CENTER ONLY)
Abs Immature Granulocytes: 0.04 10*3/uL (ref 0.00–0.07)
Basophils Absolute: 0.1 10*3/uL (ref 0.0–0.1)
Basophils Relative: 1 %
Eosinophils Absolute: 0.1 10*3/uL (ref 0.0–0.5)
Eosinophils Relative: 1 %
HCT: 26.6 % — ABNORMAL LOW (ref 39.0–52.0)
Hemoglobin: 8.9 g/dL — ABNORMAL LOW (ref 13.0–17.0)
Immature Granulocytes: 1 %
Lymphocytes Relative: 1 %
Lymphs Abs: 0.1 10*3/uL — ABNORMAL LOW (ref 0.7–4.0)
MCH: 31.6 pg (ref 26.0–34.0)
MCHC: 33.5 g/dL (ref 30.0–36.0)
MCV: 94.3 fL (ref 80.0–100.0)
Monocytes Absolute: 0.6 10*3/uL (ref 0.1–1.0)
Monocytes Relative: 10 %
Neutro Abs: 4.7 10*3/uL (ref 1.7–7.7)
Neutrophils Relative %: 86 %
Platelet Count: 150 10*3/uL (ref 150–400)
RBC: 2.82 MIL/uL — ABNORMAL LOW (ref 4.22–5.81)
RDW: 15.4 % (ref 11.5–15.5)
WBC Count: 5.4 10*3/uL (ref 4.0–10.5)
nRBC: 0 % (ref 0.0–0.2)

## 2019-07-06 LAB — IRON AND TIBC
Iron: 110 ug/dL (ref 42–163)
Saturation Ratios: 54 % (ref 20–55)
TIBC: 204 ug/dL (ref 202–409)
UIBC: 94 ug/dL — ABNORMAL LOW (ref 117–376)

## 2019-07-06 LAB — FERRITIN: Ferritin: 1723 ng/mL — ABNORMAL HIGH (ref 24–336)

## 2019-07-06 MED ORDER — DEXAMETHASONE SODIUM PHOSPHATE 10 MG/ML IJ SOLN
10.0000 mg | Freq: Once | INTRAMUSCULAR | Status: AC
  Administered 2019-07-06: 10 mg via INTRAVENOUS

## 2019-07-06 MED ORDER — SODIUM CHLORIDE 0.9 % IV SOLN
Freq: Once | INTRAVENOUS | Status: AC
  Administered 2019-07-06: 11:00:00 via INTRAVENOUS
  Filled 2019-07-06: qty 250

## 2019-07-06 MED ORDER — PALONOSETRON HCL INJECTION 0.25 MG/5ML
0.2500 mg | Freq: Once | INTRAVENOUS | Status: AC
  Administered 2019-07-06: 0.25 mg via INTRAVENOUS

## 2019-07-06 MED ORDER — DEXAMETHASONE SODIUM PHOSPHATE 10 MG/ML IJ SOLN
INTRAMUSCULAR | Status: AC
  Filled 2019-07-06: qty 1

## 2019-07-06 MED ORDER — EPOETIN ALFA-EPBX 10000 UNIT/ML IJ SOLN
INTRAMUSCULAR | Status: AC
  Filled 2019-07-06: qty 2

## 2019-07-06 MED ORDER — SODIUM CHLORIDE 0.9 % IV SOLN
75.0000 mg/m2 | Freq: Once | INTRAVENOUS | Status: AC
  Administered 2019-07-06: 125 mg via INTRAVENOUS
  Filled 2019-07-06: qty 5

## 2019-07-06 MED ORDER — PALONOSETRON HCL INJECTION 0.25 MG/5ML
INTRAVENOUS | Status: AC
  Filled 2019-07-06: qty 5

## 2019-07-06 MED ORDER — EPOETIN ALFA-EPBX 10000 UNIT/ML IJ SOLN
20000.0000 [IU] | Freq: Once | INTRAMUSCULAR | Status: AC
  Administered 2019-07-06: 20000 [IU] via SUBCUTANEOUS

## 2019-07-06 NOTE — Progress Notes (Signed)
Hematology and Oncology Follow Up Visit  Christian Burns [id number] 1930-03-16 85 y.o. 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 Palliative radiation therapy to right ilium Kyprolis/Cytoxanq 3 week dosing- s/p cycle 24 (Cytoxan restarted on cycle 13) - DC'd due to progression Radiation therapy to right femur, 10 fraction --completed 3000 rad on 11/06/2018 Elotuzomab - started 10/29/2018 -- s/p cycle #1 -- d/c on 12/24/2018 Pomalidomide 2 mg po q day (21 on/7 off) - started 10/29/2018 -- d/c 12/31/2018 Daratumumab - start cycle # 1 on 12/31/2018 -- d/c on -02/04/2019  Current Therapy:   Bendamustine/Decadron -- started on 02/10/2019, s/p cycle #5 Zometa IV q 4 weeks - on hold Aranesp 300 g subcutaneous as needed for hemoglobin less than 10 IVIG 40g IV q month -- start on 07/2019   Interim History:  Christian Burns is here today for follow-up and treatment.  He got a hospital a couple weeks ago.  He was in there with pneumonia.  Given that he has a low IgG level, I went ahead and gave him IVIG.  I think we are going to have to do this monthly to try to prevent him from having pneumonia again.  His weight is still down.  He says he is eating a little bit better.  He was tested for swallowing when in the hospital.  I think that there may be a little bit of dysphagia because of a past CVA.  Thankfully, his myeloma has responded incredibly well to bendamustine.  His last kappa light chain was down to 15.2 mg/dL.  His IgG level was 1073 mg/dL.  His M spike was down to 0.7 g/dL.   Hopefully, he will have a nice Christian Burns.  He and his wife are at assisted living.  It sounds like they probably will be by themselves.  Overall, I would say his performance status is probably ECOG 2.    Medications:  Allergies as of 07/06/2019      Reactions   Sulfa Antibiotics Itching   Sulfasalazine Itching      Medication List       Accurate as of July 06, 2019 10:30 AM. If you have any questions, ask your nurse or doctor.        amLODipine-benazepril 5-10 MG capsule Commonly known as: LOTREL TAKE ONE CAPSULE EACH DAY What changed: See the new instructions.   amoxicillin-clavulanate 875-125 MG tablet Commonly known as: AUGMENTIN Take 1 tablet by mouth every 12 (twelve) hours.   aspirin 81 MG EC tablet Take 1 tablet (81 mg total) by mouth daily.   dextromethorphan-guaiFENesin 30-600 MG 12hr tablet Commonly known as: MUCINEX DM Take 1 tablet by mouth 2 (two) times daily as needed for cough.   dronabinol 5 MG capsule Commonly known as: MARINOL TAKE ONE CAPSULE TWICE DAILY BEFORE MEALS What changed: See the new instructions.   finasteride 5 MG tablet Commonly known as: PROSCAR TAKE ONE TABLET EACH DAY What changed: See the new instructions.   lidocaine-prilocaine cream Commonly known as: EMLA Apply to affected area once   LORazepam 0.5 MG tablet Commonly known as: Ativan Take 1 tablet (0.5 mg total) by mouth every 6 (six) hours as needed (Nausea or vomiting).   megestrol 400 MG/10ML suspension Commonly known as:  MEGACE Take 15 mLs (600 mg total) by mouth daily.   multivitamin with minerals Tabs tablet Take 1 tablet by mouth daily.   ondansetron 8 MG tablet Commonly known as: Zofran Take 1 tablet (8 mg total) by mouth 2 (two) times daily as needed for refractory nausea / vomiting. Start on day 2 after Bendamustine chemotherapy.   penicillin v potassium 500 MG tablet Commonly known as: VEETID Take 1 tablet (500 mg total) by mouth 4 (four) times daily. #120 What changed: additional instructions   polyvinyl alcohol 1.4 % ophthalmic solution Commonly known as: LIQUIFILM TEARS Place 1  drop into both eyes as needed for dry eyes.   predniSONE 20 MG tablet Commonly known as: Deltasone Take 1 tablet (20 mg total) by mouth daily with breakfast.   PROBIOTIC PO Take 1 capsule by mouth daily.   prochlorperazine 10 MG tablet Commonly known as: COMPAZINE Take 1 tablet (10 mg total) by mouth every 6 (six) hours as needed (nausea and vomiting).   pyridOXINE 100 MG tablet Commonly known as: VITAMIN B-6 Take 100 mg by mouth daily.   traMADol 50 MG tablet Commonly known as: ULTRAM Take 1 tablet (50 mg total) by mouth every 8 (eight) hours as needed for moderate pain.   vitamin B-12 500 MCG tablet Commonly known as: CYANOCOBALAMIN Take 500 mcg by mouth daily.   Vitamin D (Ergocalciferol) 1.25 MG (50000 UT) Caps capsule Commonly known as: DRISDOL Take 50,000 Units by mouth every Sunday.       Allergies:  Allergies  Allergen Reactions  . Sulfa Antibiotics Itching  . Sulfasalazine Itching    Past Medical History, Surgical history, Social history, and Family History were reviewed and updated.  Review of Systems: Review of Systems  Constitutional: Positive for weight loss.  HENT: Negative.   Eyes: Negative.   Cardiovascular: Negative.   Gastrointestinal: Negative.   Genitourinary: Negative.   Musculoskeletal: Positive for myalgias.  Skin: Negative.   Neurological: Positive for focal weakness.  Endo/Heme/Allergies: Negative.   Psychiatric/Behavioral: Negative.      Physical Exam:  weight is 141 lb (64 kg). His temporal temperature is 97.5 F (36.4 C) (abnormal). His blood pressure is 180/79 (abnormal) and his pulse is 66. His respiration is 18 and oxygen saturation is 100%.   Wt Readings from Last 3 Encounters:  07/06/19 141 lb (64 kg)  06/27/19 155 lb (70.3 kg)  06/10/19 147 lb 6.4 oz (66.9 kg)    Physical Exam Vitals signs reviewed.  HENT:     Head: Normocephalic and atraumatic.  Eyes:     Pupils: Pupils are equal, round, and reactive to light.   Neck:     Musculoskeletal: Normal range of motion.  Cardiovascular:     Rate and Rhythm: Normal rate and regular rhythm.     Heart sounds: Normal heart sounds.  Pulmonary:     Effort: Pulmonary effort is normal.     Breath sounds: Normal breath sounds.  Abdominal:     General: Bowel sounds are normal.     Palpations: Abdomen is soft.  Musculoskeletal: Normal range of motion.        General: No tenderness or deformity.  Lymphadenopathy:     Cervical: No cervical adenopathy.  Skin:    General: Skin is warm and dry.     Findings: No erythema or rash.  Neurological:     Mental Status: He is alert and oriented to person, place, and time.  Psychiatric:  Behavior: Behavior normal.        Thought Content: Thought content normal.        Judgment: Judgment normal.      Lab Results  Component Value Date   WBC 5.4 07/06/2019   HGB 8.9 (L) 07/06/2019   HCT 26.6 (L) 07/06/2019   MCV 94.3 07/06/2019   PLT 150 07/06/2019   Lab Results  Component Value Date   FERRITIN 125 12/10/2018   IRON 40 (L) 12/10/2018   TIBC 220 12/10/2018   UIBC 180 12/10/2018   IRONPCTSAT 18 (L) 12/10/2018   Lab Results  Component Value Date   RETICCTPCT 1.3 12/10/2018   RBC 2.82 (L) 07/06/2019   Lab Results  Component Value Date   KPAFRELGTCHN 1.3 (L) 06/10/2019   LAMBDASER 152.0 (H) 06/10/2019   KAPLAMBRATIO 0.01 (L) 06/10/2019   Lab Results  Component Value Date   IGGSERUM 1,073 06/10/2019   IGGSERUM 1,073 06/10/2019   IGA 5 (L) 06/10/2019   IGA <5 (L) 06/10/2019   IGMSERUM 5 (L) 06/10/2019   IGMSERUM <5 (L) 06/10/2019   Lab Results  Component Value Date   TOTALPROTELP 5.5 (L) 06/10/2019   ALBUMINELP 3.0 06/10/2019   A1GS 0.3 06/10/2019   A2GS 0.8 06/10/2019   BETS 0.6 (L) 06/10/2019   BETA2SER 0.3 07/28/2015   GAMS 0.9 06/10/2019   MSPIKE 0.7 (H) 06/10/2019   SPEI Comment 05/13/2019     Chemistry      Component Value Date/Time   NA 136 06/27/2019 0505   NA 146 (H)  08/07/2017 1153   NA 141 01/09/2017 1004   K 3.7 06/27/2019 0505   K 4.6 08/07/2017 1153   K 4.4 01/09/2017 1004   CL 109 06/27/2019 0505   CL 108 08/07/2017 1153   CO2 19 (L) 06/27/2019 0505   CO2 26 08/07/2017 1153   CO2 22 01/09/2017 1004   BUN 34 (H) 06/27/2019 0505   BUN 24 (H) 08/07/2017 1153   BUN 23.9 01/09/2017 1004   CREATININE 1.48 (H) 06/27/2019 0505   CREATININE 1.57 (H) 06/10/2019 1137   CREATININE 1.56 (H) 05/26/2018 1508   CREATININE 1.5 (H) 01/09/2017 1004      Component Value Date/Time   CALCIUM 9.2 06/27/2019 0505   CALCIUM 8.8 08/07/2017 1153   CALCIUM 8.8 01/09/2017 1004   ALKPHOS 51 06/27/2019 0505   ALKPHOS 97 (H) 08/07/2017 1153   ALKPHOS 80 01/09/2017 1004   AST 38 06/27/2019 0505   AST 25 06/10/2019 1137   AST 18 01/09/2017 1004   ALT 19 06/27/2019 0505   ALT 13 06/10/2019 1137   ALT 20 08/07/2017 1153   ALT 12 01/09/2017 1004   BILITOT 0.8 06/27/2019 0505   BILITOT 0.7 06/10/2019 1137   BILITOT 0.92 01/09/2017 1004       Impression and Plan: Mr. Mais is a very pleasant 85 yo caucasian gentleman with relapsed IgG lambda myeloma.    He continues to have a nice response to treatment. His counts are looking much better.   Again, we will have to add IVIG in December.  He just had a dose back in early November.  We will follow him along also with his myeloma studies.  These will definitely tell us how things are going.  I would like to see him back in another month.  I am just glad that his quality of life is doing better.   [name], MD 11/24/202010:30 AM

## 2019-07-06 NOTE — Patient Instructions (Signed)
Bendamustine Injection What is this medicine? BENDAMUSTINE (BEN da MUS teen) is a chemotherapy drug. It is used to treat chronic lymphocytic leukemia and non-Hodgkin lymphoma. This medicine may be used for other purposes; ask your health care provider or pharmacist if you have questions. COMMON BRAND NAME(S): BELRAPZO, BENDEKA, Treanda What should I tell my health care provider before I take this medicine? They need to know if you have any of these conditions:  infection (especially a virus infection such as chickenpox, cold sores, or herpes)  kidney disease  liver disease  an unusual or allergic reaction to bendamustine, mannitol, other medicines, foods, dyes, or preservatives  pregnant or trying to get pregnant  breast-feeding How should I use this medicine? This medicine is for infusion into a vein. It is given by a health care professional in a hospital or clinic setting. Talk to your pediatrician regarding the use of this medicine in children. Special care may be needed. Overdosage: If you think you have taken too much of this medicine contact a poison control center or emergency room at once. NOTE: This medicine is only for you. Do not share this medicine with others. What if I miss a dose? It is important not to miss your dose. Call your doctor or health care professional if you are unable to keep an appointment. What may interact with this medicine? Do not take this medicine with any of the following medications:  clozapine This medicine may also interact with the following medications:  atazanavir  cimetidine  ciprofloxacin  enoxacin  fluvoxamine  medicines for seizures like carbamazepine and phenobarbital  mexiletine  rifampin  tacrine  thiabendazole  zileuton This list may not describe all possible interactions. Give your health care provider a list of all the medicines, herbs, non-prescription drugs, or dietary supplements you use. Also tell them if  you smoke, drink alcohol, or use illegal drugs. Some items may interact with your medicine. What should I watch for while using this medicine? This drug may make you feel generally unwell. This is not uncommon, as chemotherapy can affect healthy cells as well as cancer cells. Report any side effects. Continue your course of treatment even though you feel ill unless your doctor tells you to stop. You may need blood work done while you are taking this medicine. Call your doctor or healthcare provider for advice if you get a fever, chills or sore throat, or other symptoms of a cold or flu. Do not treat yourself. This drug decreases your body's ability to fight infections. Try to avoid being around people who are sick. This medicine may cause serious skin reactions. They can happen weeks to months after starting the medicine. Contact your healthcare provider right away if you notice fevers or flu-like symptoms with a rash. The rash may be red or purple and then turn into blisters or peeling of the skin. Or, you might notice a red rash with swelling of the face, lips or lymph nodes in your neck or under your arms. This medicine may increase your risk to bruise or bleed. Call your doctor or healthcare provider if you notice any unusual bleeding. Talk to your doctor about your risk of cancer. You may be more at risk for certain types of cancers if you take this medicine. Do not become pregnant while taking this medicine or for at least 6 months after stopping it. Women should inform their doctor if they wish to become pregnant or think they might be pregnant. Men should not   father a child while taking this medicine and for at least 3 months after stopping it. There is a potential for serious side effects to an unborn child. Talk to your healthcare provider or pharmacist for more information. Do not breast-feed an infant while taking this medicine or for at least 1 week after stopping it. This medicine may make it  more difficult to father a child. You should talk with your doctor or healthcare provider if you are concerned about your fertility. What side effects may I notice from receiving this medicine? Side effects that you should report to your doctor or health care professional as soon as possible:  allergic reactions like skin rash, itching or hives, swelling of the face, lips, or tongue  low blood counts - this medicine may decrease the number of white blood cells, red blood cells and platelets. You may be at increased risk for infections and bleeding.  rash, fever, and swollen lymph nodes  redness, blistering, peeling, or loosening of the skin, including inside the mouth  signs of infection like fever or chills, cough, sore throat, pain or difficulty passing urine  signs of decreased platelets or bleeding like bruising, pinpoint red spots on the skin, black, tarry stools, blood in the urine  signs of decreased red blood cells like being unusually weak or tired, fainting spells, lightheadedness  signs and symptoms of kidney injury like trouble passing urine or change in the amount of urine  signs and symptoms of liver injury like dark yellow or brown urine; general ill feeling or flu-like symptoms; light-colored stools; loss of appetite; nausea; right upper belly pain; unusually weak or tired; yellowing of the eyes or skin Side effects that usually do not require medical attention (report to your doctor or health care professional if they continue or are bothersome):  constipation  decreased appetite  diarrhea  headache  mouth sores  nausea, vomiting  tiredness This list may not describe all possible side effects. Call your doctor for medical advice about side effects. You may report side effects to FDA at 1-800-FDA-1088. Where should I keep my medicine? This drug is given in a hospital or clinic and will not be stored at home. NOTE: This sheet is a summary. It may not cover all  possible information. If you have questions about this medicine, talk to your doctor, pharmacist, or health care provider.  2020 [employer]vier/Gold Standard (2018-10-20 10:26:46)  

## 2019-07-06 NOTE — Progress Notes (Signed)
85 year old male diagnosed with Multiple Myeloma. He is a patient of Dr Marin Olp.  PMH includes CKD III, CVA, HTN, Radiation, Osteomyelitis of the jaw, Stroke, and Hernia.  Medications include Marinol, Ativan, Megace, MVI, Zofran, Prednisone, Probiotic, Compazine, Vitamin B6 and Vitamin B12.  Labs include K 3.4, BUN 32, Creat 1.37, and Albumin 3.3.  Height: 5'10". Weight: 141 pounds decreased from 155 pounds Nov 15. UBW: ~140-145 pounds. BMI:20.23.  Patient was diagnosed with Severe Malnutrition during hospital visit on November 10. Patient reports poor appetite. Lost 11% body weight over 2 weeks which is significant. States the food at [facility] is good, he just cannot eat a lot. He has difficulty chewing secondary to osteomylitis of the jaw. He thinks oral nutrition supplements are too sweet.  Nutrition Diagnosis: Malnutrition related to cancer and cancer and associated treatments as evidenced by weight loss and inadequate oral intake.  Intervention: Educated to increase snacks between meals. Encouraged clear liquid supplements or CIB. Educated on strategies for improving taste. Encouraged increased protein foods and provided fact sheets. Provided samples of clear liquid supplements. Contact information provided. Teach back method used. Questions were answered.  Monitoring, Evaluation, Goals: Patient will increase oral intake and incorporate snacks to promote weight gain.  Next Visit: To be scheduled as needed.

## 2019-07-07 ENCOUNTER — Inpatient Hospital Stay: Payer: Medicare HMO

## 2019-07-07 VITALS — BP 149/78 | HR 67 | Temp 100.0°F | Resp 17

## 2019-07-07 DIAGNOSIS — C9002 Multiple myeloma in relapse: Secondary | ICD-10-CM | POA: Diagnosis not present

## 2019-07-07 DIAGNOSIS — Z79899 Other long term (current) drug therapy: Secondary | ICD-10-CM | POA: Diagnosis not present

## 2019-07-07 DIAGNOSIS — R531 Weakness: Secondary | ICD-10-CM | POA: Diagnosis not present

## 2019-07-07 DIAGNOSIS — R634 Abnormal weight loss: Secondary | ICD-10-CM | POA: Diagnosis not present

## 2019-07-07 DIAGNOSIS — Z5111 Encounter for antineoplastic chemotherapy: Secondary | ICD-10-CM | POA: Diagnosis not present

## 2019-07-07 DIAGNOSIS — D631 Anemia in chronic kidney disease: Secondary | ICD-10-CM | POA: Diagnosis not present

## 2019-07-07 DIAGNOSIS — C9000 Multiple myeloma not having achieved remission: Secondary | ICD-10-CM

## 2019-07-07 DIAGNOSIS — N183 Chronic kidney disease, stage 3 unspecified: Secondary | ICD-10-CM | POA: Diagnosis not present

## 2019-07-07 DIAGNOSIS — Z8673 Personal history of transient ischemic attack (TIA), and cerebral infarction without residual deficits: Secondary | ICD-10-CM | POA: Diagnosis not present

## 2019-07-07 DIAGNOSIS — M791 Myalgia, unspecified site: Secondary | ICD-10-CM | POA: Diagnosis not present

## 2019-07-07 LAB — IGG, IGA, IGM
IgA: 11 mg/dL — ABNORMAL LOW (ref 61–437)
IgG (Immunoglobin G), Serum: 1886 mg/dL — ABNORMAL HIGH (ref 603–1613)
IgM (Immunoglobulin M), Srm: 35 mg/dL (ref 15–143)

## 2019-07-07 LAB — PROTEIN ELECTROPHORESIS, SERUM
A/G Ratio: 0.9 (ref 0.7–1.7)
Albumin ELP: 3 g/dL (ref 2.9–4.4)
Alpha-1-Globulin: 0.3 g/dL (ref 0.0–0.4)
Alpha-2-Globulin: 0.7 g/dL (ref 0.4–1.0)
Beta Globulin: 0.7 g/dL (ref 0.7–1.3)
Gamma Globulin: 1.6 g/dL (ref 0.4–1.8)
Globulin, Total: 3.3 g/dL (ref 2.2–3.9)
M-Spike, %: 1 g/dL — ABNORMAL HIGH
Total Protein ELP: 6.3 g/dL (ref 6.0–8.5)

## 2019-07-07 LAB — KAPPA/LAMBDA LIGHT CHAINS
Kappa free light chain: 5.8 mg/L (ref 3.3–19.4)
Kappa, lambda light chain ratio: 0.04 — ABNORMAL LOW (ref 0.26–1.65)
Lambda free light chains: 139.1 mg/L — ABNORMAL HIGH (ref 5.7–26.3)

## 2019-07-07 MED ORDER — DEXAMETHASONE SODIUM PHOSPHATE 10 MG/ML IJ SOLN
INTRAMUSCULAR | Status: AC
  Filled 2019-07-07: qty 1

## 2019-07-07 MED ORDER — DEXAMETHASONE SODIUM PHOSPHATE 10 MG/ML IJ SOLN
10.0000 mg | Freq: Once | INTRAMUSCULAR | Status: AC
  Administered 2019-07-07: 11:00:00 10 mg via INTRAVENOUS

## 2019-07-07 MED ORDER — SODIUM CHLORIDE 0.9 % IV SOLN
Freq: Once | INTRAVENOUS | Status: AC
  Administered 2019-07-07: 11:00:00 via INTRAVENOUS
  Filled 2019-07-07: qty 250

## 2019-07-07 MED ORDER — SODIUM CHLORIDE 0.9 % IV SOLN
75.0000 mg/m2 | Freq: Once | INTRAVENOUS | Status: AC
  Administered 2019-07-07: 125 mg via INTRAVENOUS
  Filled 2019-07-07: qty 5

## 2019-07-07 NOTE — Patient Instructions (Signed)
Bendamustine Injection What is this medicine? BENDAMUSTINE (BEN da MUS teen) is a chemotherapy drug. It is used to treat chronic lymphocytic leukemia and non-Hodgkin lymphoma. This medicine may be used for other purposes; ask your health care provider or pharmacist if you have questions. COMMON BRAND NAME(S): BELRAPZO, BENDEKA, Treanda What should I tell my health care provider before I take this medicine? They need to know if you have any of these conditions:  infection (especially a virus infection such as chickenpox, cold sores, or herpes)  kidney disease  liver disease  an unusual or allergic reaction to bendamustine, mannitol, other medicines, foods, dyes, or preservatives  pregnant or trying to get pregnant  breast-feeding How should I use this medicine? This medicine is for infusion into a vein. It is given by a health care professional in a hospital or clinic setting. Talk to your pediatrician regarding the use of this medicine in children. Special care may be needed. Overdosage: If you think you have taken too much of this medicine contact a poison control center or emergency room at once. NOTE: This medicine is only for you. Do not share this medicine with others. What if I miss a dose? It is important not to miss your dose. Call your doctor or health care professional if you are unable to keep an appointment. What may interact with this medicine? Do not take this medicine with any of the following medications:  clozapine This medicine may also interact with the following medications:  atazanavir  cimetidine  ciprofloxacin  enoxacin  fluvoxamine  medicines for seizures like carbamazepine and phenobarbital  mexiletine  rifampin  tacrine  thiabendazole  zileuton This list may not describe all possible interactions. Give your health care provider a list of all the medicines, herbs, non-prescription drugs, or dietary supplements you use. Also tell them if  you smoke, drink alcohol, or use illegal drugs. Some items may interact with your medicine. What should I watch for while using this medicine? This drug may make you feel generally unwell. This is not uncommon, as chemotherapy can affect healthy cells as well as cancer cells. Report any side effects. Continue your course of treatment even though you feel ill unless your doctor tells you to stop. You may need blood work done while you are taking this medicine. Call your doctor or healthcare provider for advice if you get a fever, chills or sore throat, or other symptoms of a cold or flu. Do not treat yourself. This drug decreases your body's ability to fight infections. Try to avoid being around people who are sick. This medicine may cause serious skin reactions. They can happen weeks to months after starting the medicine. Contact your healthcare provider right away if you notice fevers or flu-like symptoms with a rash. The rash may be red or purple and then turn into blisters or peeling of the skin. Or, you might notice a red rash with swelling of the face, lips or lymph nodes in your neck or under your arms. This medicine may increase your risk to bruise or bleed. Call your doctor or healthcare provider if you notice any unusual bleeding. Talk to your doctor about your risk of cancer. You may be more at risk for certain types of cancers if you take this medicine. Do not become pregnant while taking this medicine or for at least 6 months after stopping it. Women should inform their doctor if they wish to become pregnant or think they might be pregnant. Men should not   father a child while taking this medicine and for at least 3 months after stopping it. There is a potential for serious side effects to an unborn child. Talk to your healthcare provider or pharmacist for more information. Do not breast-feed an infant while taking this medicine or for at least 1 week after stopping it. This medicine may make it  more difficult to father a child. You should talk with your doctor or healthcare provider if you are concerned about your fertility. What side effects may I notice from receiving this medicine? Side effects that you should report to your doctor or health care professional as soon as possible:  allergic reactions like skin rash, itching or hives, swelling of the face, lips, or tongue  low blood counts - this medicine may decrease the number of white blood cells, red blood cells and platelets. You may be at increased risk for infections and bleeding.  rash, fever, and swollen lymph nodes  redness, blistering, peeling, or loosening of the skin, including inside the mouth  signs of infection like fever or chills, cough, sore throat, pain or difficulty passing urine  signs of decreased platelets or bleeding like bruising, pinpoint red spots on the skin, black, tarry stools, blood in the urine  signs of decreased red blood cells like being unusually weak or tired, fainting spells, lightheadedness  signs and symptoms of kidney injury like trouble passing urine or change in the amount of urine  signs and symptoms of liver injury like dark yellow or brown urine; general ill feeling or flu-like symptoms; light-colored stools; loss of appetite; nausea; right upper belly pain; unusually weak or tired; yellowing of the eyes or skin Side effects that usually do not require medical attention (report to your doctor or health care professional if they continue or are bothersome):  constipation  decreased appetite  diarrhea  headache  mouth sores  nausea, vomiting  tiredness This list may not describe all possible side effects. Call your doctor for medical advice about side effects. You may report side effects to FDA at 1-800-FDA-1088. Where should I keep my medicine? This drug is given in a hospital or clinic and will not be stored at home. NOTE: This sheet is a summary. It may not cover all  possible information. If you have questions about this medicine, talk to your doctor, pharmacist, or health care provider.  2020 [employer]vier/Gold Standard (2018-10-20 10:26:46)  

## 2019-07-09 DIAGNOSIS — M6281 Muscle weakness (generalized): Secondary | ICD-10-CM | POA: Diagnosis not present

## 2019-07-09 DIAGNOSIS — R2681 Unsteadiness on feet: Secondary | ICD-10-CM | POA: Diagnosis not present

## 2019-07-09 DIAGNOSIS — R262 Difficulty in walking, not elsewhere classified: Secondary | ICD-10-CM | POA: Diagnosis not present

## 2019-07-10 ENCOUNTER — Other Ambulatory Visit: Payer: Self-pay | Admitting: Hematology & Oncology

## 2019-07-10 DIAGNOSIS — C9002 Multiple myeloma in relapse: Secondary | ICD-10-CM

## 2019-07-10 DIAGNOSIS — R634 Abnormal weight loss: Secondary | ICD-10-CM

## 2019-07-10 DIAGNOSIS — R112 Nausea with vomiting, unspecified: Secondary | ICD-10-CM

## 2019-07-10 DIAGNOSIS — T451X5A Adverse effect of antineoplastic and immunosuppressive drugs, initial encounter: Secondary | ICD-10-CM

## 2019-07-12 DIAGNOSIS — R1311 Dysphagia, oral phase: Secondary | ICD-10-CM | POA: Diagnosis not present

## 2019-07-12 DIAGNOSIS — M859 Disorder of bone density and structure, unspecified: Secondary | ICD-10-CM | POA: Diagnosis not present

## 2019-07-12 DIAGNOSIS — R262 Difficulty in walking, not elsewhere classified: Secondary | ICD-10-CM | POA: Diagnosis not present

## 2019-07-12 DIAGNOSIS — E7849 Other hyperlipidemia: Secondary | ICD-10-CM | POA: Diagnosis not present

## 2019-07-12 DIAGNOSIS — R2681 Unsteadiness on feet: Secondary | ICD-10-CM | POA: Diagnosis not present

## 2019-07-12 DIAGNOSIS — M6281 Muscle weakness (generalized): Secondary | ICD-10-CM | POA: Diagnosis not present

## 2019-07-12 DIAGNOSIS — R41841 Cognitive communication deficit: Secondary | ICD-10-CM | POA: Diagnosis not present

## 2019-07-12 DIAGNOSIS — R972 Elevated prostate specific antigen [PSA]: Secondary | ICD-10-CM | POA: Diagnosis not present

## 2019-07-12 DIAGNOSIS — J189 Pneumonia, unspecified organism: Secondary | ICD-10-CM | POA: Diagnosis not present

## 2019-07-13 DIAGNOSIS — R41841 Cognitive communication deficit: Secondary | ICD-10-CM | POA: Diagnosis not present

## 2019-07-13 DIAGNOSIS — R1311 Dysphagia, oral phase: Secondary | ICD-10-CM | POA: Diagnosis not present

## 2019-07-14 DIAGNOSIS — R262 Difficulty in walking, not elsewhere classified: Secondary | ICD-10-CM | POA: Diagnosis not present

## 2019-07-14 DIAGNOSIS — I129 Hypertensive chronic kidney disease with stage 1 through stage 4 chronic kidney disease, or unspecified chronic kidney disease: Secondary | ICD-10-CM | POA: Diagnosis not present

## 2019-07-14 DIAGNOSIS — R82998 Other abnormal findings in urine: Secondary | ICD-10-CM | POA: Diagnosis not present

## 2019-07-14 DIAGNOSIS — M6281 Muscle weakness (generalized): Secondary | ICD-10-CM | POA: Diagnosis not present

## 2019-07-14 DIAGNOSIS — R2681 Unsteadiness on feet: Secondary | ICD-10-CM | POA: Diagnosis not present

## 2019-07-14 DIAGNOSIS — R41841 Cognitive communication deficit: Secondary | ICD-10-CM | POA: Diagnosis not present

## 2019-07-14 DIAGNOSIS — R1311 Dysphagia, oral phase: Secondary | ICD-10-CM | POA: Diagnosis not present

## 2019-07-15 DIAGNOSIS — R41841 Cognitive communication deficit: Secondary | ICD-10-CM | POA: Diagnosis not present

## 2019-07-15 DIAGNOSIS — R1311 Dysphagia, oral phase: Secondary | ICD-10-CM | POA: Diagnosis not present

## 2019-07-16 DIAGNOSIS — R41841 Cognitive communication deficit: Secondary | ICD-10-CM | POA: Diagnosis not present

## 2019-07-16 DIAGNOSIS — R1311 Dysphagia, oral phase: Secondary | ICD-10-CM | POA: Diagnosis not present

## 2019-07-19 DIAGNOSIS — Z8679 Personal history of other diseases of the circulatory system: Secondary | ICD-10-CM | POA: Diagnosis not present

## 2019-07-19 DIAGNOSIS — Z1331 Encounter for screening for depression: Secondary | ICD-10-CM | POA: Diagnosis not present

## 2019-07-19 DIAGNOSIS — Z1339 Encounter for screening examination for other mental health and behavioral disorders: Secondary | ICD-10-CM | POA: Diagnosis not present

## 2019-07-19 DIAGNOSIS — D649 Anemia, unspecified: Secondary | ICD-10-CM | POA: Diagnosis not present

## 2019-07-19 DIAGNOSIS — N1831 Chronic kidney disease, stage 3a: Secondary | ICD-10-CM | POA: Diagnosis not present

## 2019-07-19 DIAGNOSIS — R972 Elevated prostate specific antigen [PSA]: Secondary | ICD-10-CM | POA: Diagnosis not present

## 2019-07-19 DIAGNOSIS — C9000 Multiple myeloma not having achieved remission: Secondary | ICD-10-CM | POA: Diagnosis not present

## 2019-07-19 DIAGNOSIS — R634 Abnormal weight loss: Secondary | ICD-10-CM | POA: Diagnosis not present

## 2019-07-19 DIAGNOSIS — M272 Inflammatory conditions of jaws: Secondary | ICD-10-CM | POA: Diagnosis not present

## 2019-07-19 DIAGNOSIS — Z0000 Encounter for general adult medical examination without abnormal findings: Secondary | ICD-10-CM | POA: Diagnosis not present

## 2019-07-19 DIAGNOSIS — M8580 Other specified disorders of bone density and structure, unspecified site: Secondary | ICD-10-CM | POA: Diagnosis not present

## 2019-07-19 DIAGNOSIS — I129 Hypertensive chronic kidney disease with stage 1 through stage 4 chronic kidney disease, or unspecified chronic kidney disease: Secondary | ICD-10-CM | POA: Diagnosis not present

## 2019-07-20 DIAGNOSIS — R41841 Cognitive communication deficit: Secondary | ICD-10-CM | POA: Diagnosis not present

## 2019-07-20 DIAGNOSIS — R262 Difficulty in walking, not elsewhere classified: Secondary | ICD-10-CM | POA: Diagnosis not present

## 2019-07-20 DIAGNOSIS — M6281 Muscle weakness (generalized): Secondary | ICD-10-CM | POA: Diagnosis not present

## 2019-07-20 DIAGNOSIS — R2681 Unsteadiness on feet: Secondary | ICD-10-CM | POA: Diagnosis not present

## 2019-07-20 DIAGNOSIS — R1311 Dysphagia, oral phase: Secondary | ICD-10-CM | POA: Diagnosis not present

## 2019-07-21 DIAGNOSIS — R41841 Cognitive communication deficit: Secondary | ICD-10-CM | POA: Diagnosis not present

## 2019-07-21 DIAGNOSIS — R1311 Dysphagia, oral phase: Secondary | ICD-10-CM | POA: Diagnosis not present

## 2019-07-22 DIAGNOSIS — R2681 Unsteadiness on feet: Secondary | ICD-10-CM | POA: Diagnosis not present

## 2019-07-22 DIAGNOSIS — M6281 Muscle weakness (generalized): Secondary | ICD-10-CM | POA: Diagnosis not present

## 2019-07-22 DIAGNOSIS — R1311 Dysphagia, oral phase: Secondary | ICD-10-CM | POA: Diagnosis not present

## 2019-07-22 DIAGNOSIS — R41841 Cognitive communication deficit: Secondary | ICD-10-CM | POA: Diagnosis not present

## 2019-07-22 DIAGNOSIS — R262 Difficulty in walking, not elsewhere classified: Secondary | ICD-10-CM | POA: Diagnosis not present

## 2019-07-23 ENCOUNTER — Other Ambulatory Visit: Payer: Self-pay | Admitting: *Deleted

## 2019-07-23 ENCOUNTER — Telehealth: Payer: Self-pay | Admitting: *Deleted

## 2019-07-23 DIAGNOSIS — R1311 Dysphagia, oral phase: Secondary | ICD-10-CM | POA: Diagnosis not present

## 2019-07-23 DIAGNOSIS — R41841 Cognitive communication deficit: Secondary | ICD-10-CM | POA: Diagnosis not present

## 2019-07-23 NOTE — Telephone Encounter (Addendum)
Message received from patient with questions regarding his scheduled appointments.  Call placed back to patient and patient's schedule reviewed with patient.  Pt is correct in that his schedule is not accurate for December 23rd.  Message sent to scheduling to add two days of chemo appts.  Pt notified.

## 2019-07-26 ENCOUNTER — Encounter: Payer: Self-pay | Admitting: *Deleted

## 2019-07-26 ENCOUNTER — Other Ambulatory Visit: Payer: Self-pay

## 2019-07-26 NOTE — Progress Notes (Signed)
Carelink Summary Report / Loop Recorder 

## 2019-07-26 NOTE — Patient Outreach (Signed)
First telephone outreach attempt to obtain mRS. No answer. Left message for returned call.   [facility] Management Assistant

## 2019-07-27 DIAGNOSIS — R1311 Dysphagia, oral phase: Secondary | ICD-10-CM | POA: Diagnosis not present

## 2019-07-27 DIAGNOSIS — R41841 Cognitive communication deficit: Secondary | ICD-10-CM | POA: Diagnosis not present

## 2019-07-28 ENCOUNTER — Encounter: Payer: Self-pay | Admitting: *Deleted

## 2019-07-28 DIAGNOSIS — R41841 Cognitive communication deficit: Secondary | ICD-10-CM | POA: Diagnosis not present

## 2019-07-28 DIAGNOSIS — R262 Difficulty in walking, not elsewhere classified: Secondary | ICD-10-CM | POA: Diagnosis not present

## 2019-07-28 DIAGNOSIS — M6281 Muscle weakness (generalized): Secondary | ICD-10-CM | POA: Diagnosis not present

## 2019-07-28 DIAGNOSIS — R2681 Unsteadiness on feet: Secondary | ICD-10-CM | POA: Diagnosis not present

## 2019-07-28 DIAGNOSIS — R1311 Dysphagia, oral phase: Secondary | ICD-10-CM | POA: Diagnosis not present

## 2019-07-28 NOTE — Progress Notes (Signed)
Faxed dental letter to [facility]. Fax number is [phone].

## 2019-07-29 ENCOUNTER — Other Ambulatory Visit: Payer: Self-pay

## 2019-07-29 NOTE — Patient Outreach (Signed)
Telephone outreach to patient to obtain mRS was successfully completed. MRS=0   [facility]

## 2019-07-30 DIAGNOSIS — R41841 Cognitive communication deficit: Secondary | ICD-10-CM | POA: Diagnosis not present

## 2019-07-30 DIAGNOSIS — R1311 Dysphagia, oral phase: Secondary | ICD-10-CM | POA: Diagnosis not present

## 2019-08-02 ENCOUNTER — Ambulatory Visit (INDEPENDENT_AMBULATORY_CARE_PROVIDER_SITE_OTHER): Payer: Medicare HMO | Admitting: *Deleted

## 2019-08-02 DIAGNOSIS — R41841 Cognitive communication deficit: Secondary | ICD-10-CM | POA: Diagnosis not present

## 2019-08-02 DIAGNOSIS — I63412 Cerebral infarction due to embolism of left middle cerebral artery: Secondary | ICD-10-CM | POA: Diagnosis not present

## 2019-08-02 DIAGNOSIS — R1311 Dysphagia, oral phase: Secondary | ICD-10-CM | POA: Diagnosis not present

## 2019-08-02 LAB — CUP PACEART REMOTE DEVICE CHECK
Date Time Interrogation Session: 20201219120410
Implantable Pulse Generator Implant Date: 20200909

## 2019-08-03 DIAGNOSIS — R2681 Unsteadiness on feet: Secondary | ICD-10-CM | POA: Diagnosis not present

## 2019-08-03 DIAGNOSIS — R262 Difficulty in walking, not elsewhere classified: Secondary | ICD-10-CM | POA: Diagnosis not present

## 2019-08-03 DIAGNOSIS — R41841 Cognitive communication deficit: Secondary | ICD-10-CM | POA: Diagnosis not present

## 2019-08-03 DIAGNOSIS — R1311 Dysphagia, oral phase: Secondary | ICD-10-CM | POA: Diagnosis not present

## 2019-08-03 DIAGNOSIS — M6281 Muscle weakness (generalized): Secondary | ICD-10-CM | POA: Diagnosis not present

## 2019-08-04 ENCOUNTER — Other Ambulatory Visit: Payer: Self-pay

## 2019-08-04 ENCOUNTER — Inpatient Hospital Stay: Payer: Medicare HMO

## 2019-08-04 ENCOUNTER — Encounter: Payer: Self-pay | Admitting: Family

## 2019-08-04 ENCOUNTER — Inpatient Hospital Stay (HOSPITAL_BASED_OUTPATIENT_CLINIC_OR_DEPARTMENT_OTHER): Payer: Medicare HMO | Admitting: Family

## 2019-08-04 ENCOUNTER — Inpatient Hospital Stay: Payer: Medicare HMO | Attending: Hematology & Oncology

## 2019-08-04 VITALS — BP 174/76 | HR 69 | Temp 97.3°F | Resp 18 | Ht 70.0 in | Wt 144.0 lb

## 2019-08-04 VITALS — BP 158/74 | HR 66

## 2019-08-04 DIAGNOSIS — T451X5A Adverse effect of antineoplastic and immunosuppressive drugs, initial encounter: Secondary | ICD-10-CM | POA: Diagnosis not present

## 2019-08-04 DIAGNOSIS — M272 Inflammatory conditions of jaws: Secondary | ICD-10-CM | POA: Insufficient documentation

## 2019-08-04 DIAGNOSIS — Z882 Allergy status to sulfonamides status: Secondary | ICD-10-CM | POA: Insufficient documentation

## 2019-08-04 DIAGNOSIS — Z5111 Encounter for antineoplastic chemotherapy: Secondary | ICD-10-CM | POA: Insufficient documentation

## 2019-08-04 DIAGNOSIS — Z923 Personal history of irradiation: Secondary | ICD-10-CM | POA: Insufficient documentation

## 2019-08-04 DIAGNOSIS — D508 Other iron deficiency anemias: Secondary | ICD-10-CM

## 2019-08-04 DIAGNOSIS — N289 Disorder of kidney and ureter, unspecified: Secondary | ICD-10-CM | POA: Insufficient documentation

## 2019-08-04 DIAGNOSIS — D6481 Anemia due to antineoplastic chemotherapy: Secondary | ICD-10-CM | POA: Diagnosis not present

## 2019-08-04 DIAGNOSIS — C9002 Multiple myeloma in relapse: Secondary | ICD-10-CM | POA: Diagnosis not present

## 2019-08-04 DIAGNOSIS — G629 Polyneuropathy, unspecified: Secondary | ICD-10-CM | POA: Diagnosis not present

## 2019-08-04 DIAGNOSIS — Z79899 Other long term (current) drug therapy: Secondary | ICD-10-CM | POA: Diagnosis not present

## 2019-08-04 DIAGNOSIS — C9000 Multiple myeloma not having achieved remission: Secondary | ICD-10-CM

## 2019-08-04 DIAGNOSIS — C9001 Multiple myeloma in remission: Secondary | ICD-10-CM

## 2019-08-04 LAB — CBC WITH DIFFERENTIAL (CANCER CENTER ONLY)
Abs Immature Granulocytes: 0.19 10*3/uL — ABNORMAL HIGH (ref 0.00–0.07)
Basophils Absolute: 0 10*3/uL (ref 0.0–0.1)
Basophils Relative: 1 %
Eosinophils Absolute: 0.1 10*3/uL (ref 0.0–0.5)
Eosinophils Relative: 1 %
HCT: 27 % — ABNORMAL LOW (ref 39.0–52.0)
Hemoglobin: 9 g/dL — ABNORMAL LOW (ref 13.0–17.0)
Immature Granulocytes: 4 %
Lymphocytes Relative: 1 %
Lymphs Abs: 0.1 10*3/uL — ABNORMAL LOW (ref 0.7–4.0)
MCH: 31.5 pg (ref 26.0–34.0)
MCHC: 33.3 g/dL (ref 30.0–36.0)
MCV: 94.4 fL (ref 80.0–100.0)
Monocytes Absolute: 0.8 10*3/uL (ref 0.1–1.0)
Monocytes Relative: 15 %
Neutro Abs: 3.8 10*3/uL (ref 1.7–7.7)
Neutrophils Relative %: 78 %
Platelet Count: 126 10*3/uL — ABNORMAL LOW (ref 150–400)
RBC: 2.86 MIL/uL — ABNORMAL LOW (ref 4.22–5.81)
RDW: 15.9 % — ABNORMAL HIGH (ref 11.5–15.5)
WBC Count: 4.9 10*3/uL (ref 4.0–10.5)
nRBC: 0 % (ref 0.0–0.2)

## 2019-08-04 LAB — CMP (CANCER CENTER ONLY)
ALT: 15 U/L (ref 0–44)
AST: 23 U/L (ref 15–41)
Albumin: 3.2 g/dL — ABNORMAL LOW (ref 3.5–5.0)
Alkaline Phosphatase: 51 U/L (ref 38–126)
Anion gap: 6 (ref 5–15)
BUN: 34 mg/dL — ABNORMAL HIGH (ref 8–23)
CO2: 26 mmol/L (ref 22–32)
Calcium: 8.3 mg/dL — ABNORMAL LOW (ref 8.9–10.3)
Chloride: 108 mmol/L (ref 98–111)
Creatinine: 1.39 mg/dL — ABNORMAL HIGH (ref 0.61–1.24)
GFR, Est AFR Am: 52 mL/min — ABNORMAL LOW (ref >60)
GFR, Estimated: 45 mL/min — ABNORMAL LOW (ref >60)
Glucose, Bld: 106 mg/dL — ABNORMAL HIGH (ref 70–99)
Potassium: 3.6 mmol/L (ref 3.5–5.1)
Sodium: 140 mmol/L (ref 135–145)
Total Bilirubin: 0.6 mg/dL (ref 0.3–1.2)
Total Protein: 5.8 g/dL — ABNORMAL LOW (ref 6.5–8.1)

## 2019-08-04 MED ORDER — ACETAMINOPHEN 325 MG PO TABS
650.0000 mg | ORAL_TABLET | Freq: Once | ORAL | Status: AC
  Administered 2019-08-04: 650 mg via ORAL

## 2019-08-04 MED ORDER — PALONOSETRON HCL INJECTION 0.25 MG/5ML
0.2500 mg | Freq: Once | INTRAVENOUS | Status: AC
  Administered 2019-08-04: 0.25 mg via INTRAVENOUS

## 2019-08-04 MED ORDER — IMMUNE GLOBULIN (HUMAN) 20 GM/200ML IV SOLN
40.0000 g | Freq: Once | INTRAVENOUS | Status: AC
  Administered 2019-08-04: 40 g via INTRAVENOUS
  Filled 2019-08-04: qty 400

## 2019-08-04 MED ORDER — DEXAMETHASONE SODIUM PHOSPHATE 10 MG/ML IJ SOLN
10.0000 mg | Freq: Once | INTRAMUSCULAR | Status: AC
  Administered 2019-08-04: 10 mg via INTRAVENOUS

## 2019-08-04 MED ORDER — ACETAMINOPHEN 325 MG PO TABS
ORAL_TABLET | ORAL | Status: AC
  Filled 2019-08-04: qty 2

## 2019-08-04 MED ORDER — DEXAMETHASONE SODIUM PHOSPHATE 10 MG/ML IJ SOLN
INTRAMUSCULAR | Status: AC
  Filled 2019-08-04: qty 1

## 2019-08-04 MED ORDER — SODIUM CHLORIDE 0.9 % IV SOLN
Freq: Once | INTRAVENOUS | Status: AC
  Filled 2019-08-04: qty 250

## 2019-08-04 MED ORDER — EPOETIN ALFA-EPBX 10000 UNIT/ML IJ SOLN
20000.0000 [IU] | Freq: Once | INTRAMUSCULAR | Status: AC
  Administered 2019-08-04: 20000 [IU] via SUBCUTANEOUS

## 2019-08-04 MED ORDER — EPOETIN ALFA-EPBX 10000 UNIT/ML IJ SOLN
INTRAMUSCULAR | Status: AC
  Filled 2019-08-04: qty 2

## 2019-08-04 MED ORDER — DEXTROSE 5 % IV SOLN
INTRAVENOUS | Status: DC
  Filled 2019-08-04 (×2): qty 250

## 2019-08-04 MED ORDER — PALONOSETRON HCL INJECTION 0.25 MG/5ML
INTRAVENOUS | Status: AC
  Filled 2019-08-04: qty 5

## 2019-08-04 MED ORDER — SODIUM CHLORIDE 0.9 % IV SOLN
75.0000 mg/m2 | Freq: Once | INTRAVENOUS | Status: AC
  Administered 2019-08-04: 125 mg via INTRAVENOUS
  Filled 2019-08-04: qty 5

## 2019-08-04 MED ORDER — DIPHENHYDRAMINE HCL 25 MG PO CAPS
25.0000 mg | ORAL_CAPSULE | Freq: Once | ORAL | Status: AC
  Administered 2019-08-04: 25 mg via ORAL

## 2019-08-04 MED ORDER — DIPHENHYDRAMINE HCL 25 MG PO CAPS
ORAL_CAPSULE | ORAL | Status: AC
  Filled 2019-08-04: qty 1

## 2019-08-04 NOTE — Addendum Note (Signed)
Addended by: Burney Gauze R on: 08/04/2019 11:00 AM   Modules accepted: Orders

## 2019-08-04 NOTE — Patient Instructions (Signed)
Bendamustine Injection What is this medicine? BENDAMUSTINE (BEN da MUS teen) is a chemotherapy drug. It is used to treat chronic lymphocytic leukemia and non-Hodgkin lymphoma. This medicine may be used for other purposes; ask your health care provider or pharmacist if you have questions. COMMON BRAND NAME(S): Kristine Royal, Treanda What should I tell my health care provider before I take this medicine? They need to know if you have any of these conditions:  infection (especially a virus infection such as chickenpox, cold sores, or herpes)  kidney disease  liver disease  an unusual or allergic reaction to bendamustine, mannitol, other medicines, foods, dyes, or preservatives  pregnant or trying to get pregnant  breast-feeding How should I use this medicine? This medicine is for infusion into a vein. It is given by a health care professional in a hospital or clinic setting. Talk to your pediatrician regarding the use of this medicine in children. Special care may be needed. Overdosage: If you think you have taken too much of this medicine contact a poison control center or emergency room at once. NOTE: This medicine is only for you. Do not share this medicine with others. What if I miss a dose? It is important not to miss your dose. Call your doctor or health care professional if you are unable to keep an appointment. What may interact with this medicine? Do not take this medicine with any of the following medications:  clozapine This medicine may also interact with the following medications:  atazanavir  cimetidine  ciprofloxacin  enoxacin  fluvoxamine  medicines for seizures like carbamazepine and phenobarbital  mexiletine  rifampin  tacrine  thiabendazole  zileuton This list may not describe all possible interactions. Give your health care provider a list of all the medicines, herbs, non-prescription drugs, or dietary supplements you use. Also tell them if  you smoke, drink alcohol, or use illegal drugs. Some items may interact with your medicine. What should I watch for while using this medicine? This drug may make you feel generally unwell. This is not uncommon, as chemotherapy can affect healthy cells as well as cancer cells. Report any side effects. Continue your course of treatment even though you feel ill unless your doctor tells you to stop. You may need blood work done while you are taking this medicine. Call your doctor or healthcare provider for advice if you get a fever, chills or sore throat, or other symptoms of a cold or flu. Do not treat yourself. This drug decreases your body's ability to fight infections. Try to avoid being around people who are sick. This medicine may cause serious skin reactions. They can happen weeks to months after starting the medicine. Contact your healthcare provider right away if you notice fevers or flu-like symptoms with a rash. The rash may be red or purple and then turn into blisters or peeling of the skin. Or, you might notice a red rash with swelling of the face, lips or lymph nodes in your neck or under your arms. This medicine may increase your risk to bruise or bleed. Call your doctor or healthcare provider if you notice any unusual bleeding. Talk to your doctor about your risk of cancer. You may be more at risk for certain types of cancers if you take this medicine. Do not become pregnant while taking this medicine or for at least 6 months after stopping it. Women should inform their doctor if they wish to become pregnant or think they might be pregnant. Men should not  father a child while taking this medicine and for at least 3 months after stopping it. There is a potential for serious side effects to an unborn child. Talk to your healthcare provider or pharmacist for more information. Do not breast-feed an infant while taking this medicine or for at least 1 week after stopping it. This medicine may make it  more difficult to father a child. You should talk with your doctor or healthcare provider if you are concerned about your fertility. What side effects may I notice from receiving this medicine? Side effects that you should report to your doctor or health care professional as soon as possible:  allergic reactions like skin rash, itching or hives, swelling of the face, lips, or tongue  low blood counts - this medicine may decrease the number of white blood cells, red blood cells and platelets. You may be at increased risk for infections and bleeding.  rash, fever, and swollen lymph nodes  redness, blistering, peeling, or loosening of the skin, including inside the mouth  signs of infection like fever or chills, cough, sore throat, pain or difficulty passing urine  signs of decreased platelets or bleeding like bruising, pinpoint red spots on the skin, black, tarry stools, blood in the urine  signs of decreased red blood cells like being unusually weak or tired, fainting spells, lightheadedness  signs and symptoms of kidney injury like trouble passing urine or change in the amount of urine  signs and symptoms of liver injury like dark yellow or brown urine; general ill feeling or flu-like symptoms; light-colored stools; loss of appetite; nausea; right upper belly pain; unusually weak or tired; yellowing of the eyes or skin Side effects that usually do not require medical attention (report to your doctor or health care professional if they continue or are bothersome):  constipation  decreased appetite  diarrhea  headache  mouth sores  nausea, vomiting  tiredness This list may not describe all possible side effects. Call your doctor for medical advice about side effects. You may report side effects to FDA at 1-800-FDA-1088. Where should I keep my medicine? This drug is given in a hospital or clinic and will not be stored at home. NOTE: This sheet is a summary. It may not cover all  possible information. If you have questions about this medicine, talk to your doctor, pharmacist, or health care provider.  2020 Elsevier/Gold Standard (2018-10-20 10:26:46) Immune Globulin Injection What is this medicine? IMMUNE GLOBULIN (im MUNE GLOB yoo lin) helps to prevent or reduce the severity of certain infections in patients who are at risk. This medicine is collected from the pooled blood of many donors. It is used to treat immune system problems, thrombocytopenia, and Kawasaki syndrome. This medicine may be used for other purposes; ask your health care provider or pharmacist if you have questions. COMMON BRAND NAME(S): ASCENIV, Baygam, BIVIGAM, Carimune, Carimune NF, cutaquig, Cuvitru, Flebogamma, Flebogamma DIF, GamaSTAN, GamaSTAN S/D, Gamimune N, Gammagard, Gammagard S/D, Gammaked, Gammaplex, Gammar-P IV, Gamunex, Gamunex-C, Hizentra, Iveegam, Iveegam EN, Octagam, Panglobulin, Panglobulin NF, panzyga, Polygam S/D, Privigen, Sandoglobulin, Venoglobulin-S, Vigam, Vivaglobulin, Xembify What should I tell my health care provider before I take this medicine? They need to know if you have any of these conditions:   diabetes   extremely low or no immune antibodies in the blood   heart disease   history of blood clots   hyperprolinemia   infection in the blood, sepsis   kidney disease   taking medicine that may change kidney  function - ask your health care provider about your medicine   an unusual or allergic reaction to human immune globulin, albumin, maltose, sucrose, polysorbate 80, other medicines, foods, dyes, or preservatives   pregnant or trying to get pregnant   breast-feeding How should I use this medicine? This medicine is for injection into a muscle or infusion into a vein or skin. It is usually given by a health care professional in a hospital or clinic setting. In rare cases, some brands of this medicine might be given at home. You will be taught how to  give this medicine. Use exactly as directed. Take your medicine at regular intervals. Do not take your medicine more often than directed. Talk to your pediatrician regarding the use of this medicine in children. Special care may be needed. Overdosage: If you think you have taken too much of this medicine contact a poison control center or emergency room at once. NOTE: This medicine is only for you. Do not share this medicine with others. What if I miss a dose? It is important not to miss your dose. Call your doctor or health care professional if you are unable to keep an appointment. If you give yourself the medicine and you miss a dose, take it as soon as you can. If it is almost time for your next dose, take only that dose. Do not take double or extra doses. What may interact with this medicine?  aspirin and aspirin-like medicines  cisplatin  cyclosporine  medicines for infection like acyclovir, adefovir, amphotericin B, bacitracin, cidofovir, foscarnet, ganciclovir, gentamicin, pentamidine, vancomycin  NSAIDS, medicines for pain and inflammation, like ibuprofen or naproxen  pamidronate  vaccines  zoledronic acid This list may not describe all possible interactions. Give your health care provider a list of all the medicines, herbs, non-prescription drugs, or dietary supplements you use. Also tell them if you smoke, drink alcohol, or use illegal drugs. Some items may interact with your medicine. What should I watch for while using this medicine? Your condition will be monitored carefully while you are receiving this medicine. This medicine is made from pooled blood donations of many different people. It may be possible to pass an infection in this medicine. However, the donors are screened for infections and all products are tested for HIV and hepatitis. The medicine is treated to kill most or all bacteria and viruses. Talk to your doctor about the risks and benefits of this medicine. Do  not have vaccinations for at least 14 days before, or until at least 3 months after receiving this medicine. What side effects may I notice from receiving this medicine? Side effects that you should report to your doctor or health care professional as soon as possible:  allergic reactions like skin rash, itching or hives, swelling of the face, lips, or tongue  breathing problems  chest pain or tightness  fever, chills  headache with nausea, vomiting  neck pain or difficulty moving neck  pain when moving eyes  pain, swelling, warmth in the leg  problems with balance, talking, walking  sudden weight gain  swelling of the ankles, feet, hands  trouble passing urine or change in the amount of urine Side effects that usually do not require medical attention (report to your doctor or health care professional if they continue or are bothersome):  dizzy, drowsy  flushing  increased sweating  leg cramps  muscle aches and pains  pain at site where injected This list may not describe all possible side  effects. Call your doctor for medical advice about side effects. You may report side effects to FDA at 1-800-FDA-1088. Where should I keep my medicine? Keep out of the reach of children. This drug is usually given in a hospital or clinic and will not be stored at home. In rare cases, some brands of this medicine may be given at home. If you are using this medicine at home, you will be instructed on how to store this medicine. Throw away any unused medicine after the expiration date on the label. NOTE: This sheet is a summary. It may not cover all possible information. If you have questions about this medicine, talk to your doctor, pharmacist, or health care provider.  2020 Elsevier/Gold Standard (2008-10-19 11:44:49)

## 2019-08-04 NOTE — Progress Notes (Signed)
Hematology and Oncology Follow Up Visit  Christian Burns [id number] [birth year] 85 y.o. 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 Palliative radiation therapy to right ilium Kyprolis/Cytoxanq 3 week dosing- s/p cycle 24 (Cytoxan restarted on cycle 13) - DC'd due to progression Radiation therapy to right femur, 10 fraction --completed 3000 rad on 11/06/2018 Elotuzomab - started 10/29/2018 -- s/p cycle #1 -- d/c on 12/24/2018 Pomalidomide 2 mg po q day (21 on/7 off) - started 10/29/2018 -- d/c 12/31/2018 Daratumumab - start cycle # 1 on 12/31/2018 -- d/c on -02/04/2019  Current Therapy:   Bendamustine/Decadron -- started on 02/10/2019, s/p cycle#5 Zometa IV q 4 weeks - on hold Aranesp 300 g subcutaneous as needed for hemoglobin less than 10 IVIG 40g IV q month -- start on 07/2019   Interim History:  Christian Burns had a telephone follow-up prior to treatment today. He is doing well and has not had any issues wince his last visit.  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 He has a follow-up with ID on January 14th.  No fever, chills, n/v, cough, rash, dizziness, SOB, chest pain, palpitations, abdominal pain or changes in bowel or bladder habits.  November M-spike was 1.0, IgG level 1,886 mg/dL and lambda light chains 13.91 mg/dL. No swelling or tenderness in his extremities.  He has neuropathy in the fingertips and feet. He sometimes has cramping in his right hands in the evening.  He uses a walker when ambulating if needed.  No falls or syncopal episodes to report.  No episodes of bleeding. No bruising or petechiae.  He is taking Marinol to help with his appetite  and was also able to meet with our dietician Barb. He is drinking Essential and Silk for added protein and his weight is up 3 lbs.   ECOG Performance Status: 1 - Symptomatic but completely ambulatory  Medications:  Allergies as of 08/04/2019      Reactions   Sulfa Antibiotics Itching   Sulfasalazine Itching      Medication List       Accurate as of August 04, 2019  9:49 AM. If you have any questions, ask your nurse or doctor.        amLODipine-benazepril 5-10 MG capsule Commonly known as: LOTREL TAKE ONE CAPSULE EACH DAY What changed: See the new instructions.   amoxicillin-clavulanate 875-125 MG tablet Commonly known as: AUGMENTIN Take 1 tablet by mouth every 12 (twelve) hours.   aspirin 81 MG EC tablet Take 1 tablet (81 mg total) by mouth daily.   dextromethorphan-guaiFENesin 30-600 MG 12hr tablet Commonly known as: MUCINEX DM Take 1 tablet by mouth 2 (two) times daily as needed for cough.   dronabinol 5 MG capsule Commonly known as: MARINOL TAKE ONE CAPSULE TWICE DAILY BEFORE MEALS   finasteride 5 MG tablet Commonly known as: PROSCAR TAKE ONE TABLET EACH DAY What changed: See the new instructions.   lidocaine-prilocaine cream Commonly known as: EMLA Apply to affected area once   LORazepam 0.5 MG tablet Commonly known as: Ativan Take 1 tablet (0.5 mg total) by mouth every 6 (six) hours as needed (Nausea or vomiting).   megestrol 400 MG/10ML suspension Commonly known as: MEGACE Take 15 mLs (  600 mg total) by mouth daily.   multivitamin with minerals Tabs tablet Take 1 tablet by mouth daily.   ondansetron 8 MG tablet Commonly known as: Zofran Take 1 tablet (8 mg total) by mouth 2 (two) times daily as needed for refractory nausea / vomiting. Start on day 2 after Bendamustine chemotherapy.   penicillin v potassium 500 MG tablet Commonly known as: VEETID Take 1 tablet (500 mg total) by mouth 4 (four) times daily. #120 What changed: additional  instructions   polyvinyl alcohol 1.4 % ophthalmic solution Commonly known as: LIQUIFILM TEARS Place 1 drop into both eyes as needed for dry eyes.   predniSONE 20 MG tablet Commonly known as: Deltasone Take 1 tablet (20 mg total) by mouth daily with breakfast.   PROBIOTIC PO Take 1 capsule by mouth daily.   prochlorperazine 10 MG tablet Commonly known as: COMPAZINE Take 1 tablet (10 mg total) by mouth every 6 (six) hours as needed (nausea and vomiting).   pyridOXINE 100 MG tablet Commonly known as: VITAMIN B-6 Take 100 mg by mouth daily.   traMADol 50 MG tablet Commonly known as: ULTRAM Take 1 tablet (50 mg total) by mouth every 8 (eight) hours as needed for moderate pain.   vitamin B-12 500 MCG tablet Commonly known as: CYANOCOBALAMIN Take 500 mcg by mouth daily.   Vitamin D (Ergocalciferol) 1.25 MG (50000 UT) Caps capsule Commonly known as: DRISDOL Take 50,000 Units by mouth every Sunday.       Allergies:  Allergies  Allergen Reactions  . Sulfa Antibiotics Itching  . Sulfasalazine Itching    Past Medical History, Surgical history, Social history, and Family History were reviewed and updated.  Review of Systems: All other 10 point review of systems is negative.   Physical Exam:  vitals were not taken for this visit.   Wt Readings from Last 3 Encounters:  07/06/19 141 lb (64 kg)  06/27/19 155 lb (70.3 kg)  06/10/19 147 lb 6.4 oz (66.9 kg)     Lab Results  Component Value Date   WBC 4.9 08/04/2019   HGB 9.0 (L) 08/04/2019   HCT 27.0 (L) 08/04/2019   MCV 94.4 08/04/2019   PLT 126 (L) 08/04/2019   Lab Results  Component Value Date   FERRITIN 1,723 (H) 07/06/2019   IRON 110 07/06/2019   TIBC 204 07/06/2019   UIBC 94 (L) 07/06/2019   IRONPCTSAT 54 07/06/2019   Lab Results  Component Value Date   RETICCTPCT 1.3 12/10/2018   RBC 2.86 (L) 08/04/2019   Lab Results  Component Value Date   KPAFRELGTCHN 5.8 07/06/2019   LAMBDASER 139.1 (H)  07/06/2019   KAPLAMBRATIO 0.04 (L) 07/06/2019   Lab Results  Component Value Date   IGGSERUM 1,886 (H) 07/06/2019   IGA 11 (L) 07/06/2019   IGMSERUM 35 07/06/2019   Lab Results  Component Value Date   TOTALPROTELP 6.3 07/06/2019   ALBUMINELP 3.0 07/06/2019   A1GS 0.3 07/06/2019   A2GS 0.7 07/06/2019   BETS 0.7 07/06/2019   BETA2SER 0.3 07/28/2015   GAMS 1.6 07/06/2019   MSPIKE 1.0 (H) 07/06/2019   SPEI Comment 07/06/2019     Chemistry      Component Value Date/Time   NA 140 07/06/2019 0950   NA 146 (H) 08/07/2017 1153   NA 141 01/09/2017 1004   K 3.4 (L) 07/06/2019 0950   K 4.6 08/07/2017 1153   K 4.4 01/09/2017 1004   CL 110 07/06/2019 0950   CL 108 08/07/2017  1153   CO2 24 07/06/2019 0950   CO2 26 08/07/2017 1153   CO2 22 01/09/2017 1004   BUN 32 (H) 07/06/2019 0950   BUN 24 (H) 08/07/2017 1153   BUN 23.9 01/09/2017 1004   CREATININE 1.37 (H) 07/06/2019 0950   CREATININE 1.56 (H) 05/26/2018 1508   CREATININE 1.5 (H) 01/09/2017 1004      Component Value Date/Time   CALCIUM 8.4 (L) 07/06/2019 0950   CALCIUM 8.8 08/07/2017 1153   CALCIUM 8.8 01/09/2017 1004   ALKPHOS 54 07/06/2019 0950   ALKPHOS 97 (H) 08/07/2017 1153   ALKPHOS 80 01/09/2017 1004   AST 21 07/06/2019 0950   AST 18 01/09/2017 1004   ALT 15 07/06/2019 0950   ALT 20 08/07/2017 1153   ALT 12 01/09/2017 1004   BILITOT 0.5 07/06/2019 0950   BILITOT 0.92 01/09/2017 1004       Impression and Plan: Mr. Harig is a very pleasant 85yo caucasian gentleman with relapsed IgG lambda myeloma. We will proceed with treatment today as planned.  We will plan to see him back in another month.  He can call our office with any questions or concerns. We can certainly see him sooner if needed.   Laverna Peace, NP 12/23/20209:49 AM

## 2019-08-05 ENCOUNTER — Encounter: Payer: Self-pay | Admitting: *Deleted

## 2019-08-05 ENCOUNTER — Inpatient Hospital Stay: Payer: Medicare HMO

## 2019-08-05 VITALS — BP 139/73 | HR 70 | Temp 97.1°F | Resp 18 | Wt 144.1 lb

## 2019-08-05 DIAGNOSIS — Z5111 Encounter for antineoplastic chemotherapy: Secondary | ICD-10-CM | POA: Diagnosis not present

## 2019-08-05 DIAGNOSIS — C9000 Multiple myeloma not having achieved remission: Secondary | ICD-10-CM

## 2019-08-05 DIAGNOSIS — C9002 Multiple myeloma in relapse: Secondary | ICD-10-CM

## 2019-08-05 LAB — KAPPA/LAMBDA LIGHT CHAINS
Kappa free light chain: 1.5 mg/L — ABNORMAL LOW (ref 3.3–19.4)
Kappa, lambda light chain ratio: 0.01 — ABNORMAL LOW (ref 0.26–1.65)
Lambda free light chains: 134.4 mg/L — ABNORMAL HIGH (ref 5.7–26.3)

## 2019-08-05 LAB — IGG, IGA, IGM
IgA: 7 mg/dL — ABNORMAL LOW (ref 61–437)
IgG (Immunoglobin G), Serum: 1098 mg/dL (ref 603–1613)
IgM (Immunoglobulin M), Srm: 22 mg/dL (ref 15–143)

## 2019-08-05 MED ORDER — DEXAMETHASONE SODIUM PHOSPHATE 10 MG/ML IJ SOLN
INTRAMUSCULAR | Status: AC
  Filled 2019-08-05: qty 1

## 2019-08-05 MED ORDER — DEXAMETHASONE SODIUM PHOSPHATE 10 MG/ML IJ SOLN
10.0000 mg | Freq: Once | INTRAMUSCULAR | Status: AC
  Administered 2019-08-05: 10 mg via INTRAVENOUS

## 2019-08-05 MED ORDER — SODIUM CHLORIDE 0.9 % IV SOLN
75.0000 mg/m2 | Freq: Once | INTRAVENOUS | Status: AC
  Administered 2019-08-05: 125 mg via INTRAVENOUS
  Filled 2019-08-05: qty 5

## 2019-08-05 MED ORDER — SODIUM CHLORIDE 0.9 % IV SOLN
Freq: Once | INTRAVENOUS | Status: AC
  Filled 2019-08-05: qty 250

## 2019-08-05 NOTE — Patient Instructions (Signed)
Bendamustine Injection What is this medicine? BENDAMUSTINE (BEN da MUS teen) is a chemotherapy drug. It is used to treat chronic lymphocytic leukemia and non-Hodgkin lymphoma. This medicine may be used for other purposes; ask your health care provider or pharmacist if you have questions. COMMON BRAND NAME(S): Kristine Royal, Treanda What should I tell my health care provider before I take this medicine? They need to know if you have any of these conditions:  infection (especially a virus infection such as chickenpox, cold sores, or herpes)  kidney disease  liver disease  an unusual or allergic reaction to bendamustine, mannitol, other medicines, foods, dyes, or preservatives  pregnant or trying to get pregnant  breast-feeding How should I use this medicine? This medicine is for infusion into a vein. It is given by a health care professional in a hospital or clinic setting. Talk to your pediatrician regarding the use of this medicine in children. Special care may be needed. Overdosage: If you think you have taken too much of this medicine contact a poison control center or emergency room at once. NOTE: This medicine is only for you. Do not share this medicine with others. What if I miss a dose? It is important not to miss your dose. Call your doctor or health care professional if you are unable to keep an appointment. What may interact with this medicine? Do not take this medicine with any of the following medications:  clozapine This medicine may also interact with the following medications:  atazanavir  cimetidine  ciprofloxacin  enoxacin  fluvoxamine  medicines for seizures like carbamazepine and phenobarbital  mexiletine  rifampin  tacrine  thiabendazole  zileuton This list may not describe all possible interactions. Give your health care provider a list of all the medicines, herbs, non-prescription drugs, or dietary supplements you use. Also tell them if  you smoke, drink alcohol, or use illegal drugs. Some items may interact with your medicine. What should I watch for while using this medicine? This drug may make you feel generally unwell. This is not uncommon, as chemotherapy can affect healthy cells as well as cancer cells. Report any side effects. Continue your course of treatment even though you feel ill unless your doctor tells you to stop. You may need blood work done while you are taking this medicine. Call your doctor or healthcare provider for advice if you get a fever, chills or sore throat, or other symptoms of a cold or flu. Do not treat yourself. This drug decreases your body's ability to fight infections. Try to avoid being around people who are sick. This medicine may cause serious skin reactions. They can happen weeks to months after starting the medicine. Contact your healthcare provider right away if you notice fevers or flu-like symptoms with a rash. The rash may be red or purple and then turn into blisters or peeling of the skin. Or, you might notice a red rash with swelling of the face, lips or lymph nodes in your neck or under your arms. This medicine may increase your risk to bruise or bleed. Call your doctor or healthcare provider if you notice any unusual bleeding. Talk to your doctor about your risk of cancer. You may be more at risk for certain types of cancers if you take this medicine. Do not become pregnant while taking this medicine or for at least 6 months after stopping it. Women should inform their doctor if they wish to become pregnant or think they might be pregnant. Men should not  father a child while taking this medicine and for at least 3 months after stopping it. There is a potential for serious side effects to an unborn child. Talk to your healthcare provider or pharmacist for more information. Do not breast-feed an infant while taking this medicine or for at least 1 week after stopping it. This medicine may make it  more difficult to father a child. You should talk with your doctor or healthcare provider if you are concerned about your fertility. What side effects may I notice from receiving this medicine? Side effects that you should report to your doctor or health care professional as soon as possible:  allergic reactions like skin rash, itching or hives, swelling of the face, lips, or tongue  low blood counts - this medicine may decrease the number of white blood cells, red blood cells and platelets. You may be at increased risk for infections and bleeding.  rash, fever, and swollen lymph nodes  redness, blistering, peeling, or loosening of the skin, including inside the mouth  signs of infection like fever or chills, cough, sore throat, pain or difficulty passing urine  signs of decreased platelets or bleeding like bruising, pinpoint red spots on the skin, black, tarry stools, blood in the urine  signs of decreased red blood cells like being unusually weak or tired, fainting spells, lightheadedness  signs and symptoms of kidney injury like trouble passing urine or change in the amount of urine  signs and symptoms of liver injury like dark yellow or brown urine; general ill feeling or flu-like symptoms; light-colored stools; loss of appetite; nausea; right upper belly pain; unusually weak or tired; yellowing of the eyes or skin Side effects that usually do not require medical attention (report to your doctor or health care professional if they continue or are bothersome):  constipation  decreased appetite  diarrhea  headache  mouth sores  nausea, vomiting  tiredness This list may not describe all possible side effects. Call your doctor for medical advice about side effects. You may report side effects to FDA at 1-800-FDA-1088. Where should I keep my medicine? This drug is given in a hospital or clinic and will not be stored at home. NOTE: This sheet is a summary. It may not cover all  possible information. If you have questions about this medicine, talk to your doctor, pharmacist, or health care provider.  2020 Elsevier/Gold Standard (2018-10-20 10:26:46) Immune Globulin Injection What is this medicine? IMMUNE GLOBULIN (im MUNE GLOB yoo lin) helps to prevent or reduce the severity of certain infections in patients who are at risk. This medicine is collected from the pooled blood of many donors. It is used to treat immune system problems, thrombocytopenia, and Kawasaki syndrome. This medicine may be used for other purposes; ask your health care provider or pharmacist if you have questions. COMMON BRAND NAME(S): ASCENIV, Baygam, BIVIGAM, Carimune, Carimune NF, cutaquig, Cuvitru, Flebogamma, Flebogamma DIF, GamaSTAN, GamaSTAN S/D, Gamimune N, Gammagard, Gammagard S/D, Gammaked, Gammaplex, Gammar-P IV, Gamunex, Gamunex-C, Hizentra, Iveegam, Iveegam EN, Octagam, Panglobulin, Panglobulin NF, panzyga, Polygam S/D, Privigen, Sandoglobulin, Venoglobulin-S, Vigam, Vivaglobulin, Xembify What should I tell my health care provider before I take this medicine? They need to know if you have any of these conditions:   diabetes   extremely low or no immune antibodies in the blood   heart disease   history of blood clots   hyperprolinemia   infection in the blood, sepsis   kidney disease   taking medicine that may change kidney  function - ask your health care provider about your medicine   an unusual or allergic reaction to human immune globulin, albumin, maltose, sucrose, polysorbate 80, other medicines, foods, dyes, or preservatives   pregnant or trying to get pregnant   breast-feeding How should I use this medicine? This medicine is for injection into a muscle or infusion into a vein or skin. It is usually given by a health care professional in a hospital or clinic setting. In rare cases, some brands of this medicine might be given at home. You will be taught how to  give this medicine. Use exactly as directed. Take your medicine at regular intervals. Do not take your medicine more often than directed. Talk to your pediatrician regarding the use of this medicine in children. Special care may be needed. Overdosage: If you think you have taken too much of this medicine contact a poison control center or emergency room at once. NOTE: This medicine is only for you. Do not share this medicine with others. What if I miss a dose? It is important not to miss your dose. Call your doctor or health care professional if you are unable to keep an appointment. If you give yourself the medicine and you miss a dose, take it as soon as you can. If it is almost time for your next dose, take only that dose. Do not take double or extra doses. What may interact with this medicine?  aspirin and aspirin-like medicines  cisplatin  cyclosporine  medicines for infection like acyclovir, adefovir, amphotericin B, bacitracin, cidofovir, foscarnet, ganciclovir, gentamicin, pentamidine, vancomycin  NSAIDS, medicines for pain and inflammation, like ibuprofen or naproxen  pamidronate  vaccines  zoledronic acid This list may not describe all possible interactions. Give your health care provider a list of all the medicines, herbs, non-prescription drugs, or dietary supplements you use. Also tell them if you smoke, drink alcohol, or use illegal drugs. Some items may interact with your medicine. What should I watch for while using this medicine? Your condition will be monitored carefully while you are receiving this medicine. This medicine is made from pooled blood donations of many different people. It may be possible to pass an infection in this medicine. However, the donors are screened for infections and all products are tested for HIV and hepatitis. The medicine is treated to kill most or all bacteria and viruses. Talk to your doctor about the risks and benefits of this medicine. Do  not have vaccinations for at least 14 days before, or until at least 3 months after receiving this medicine. What side effects may I notice from receiving this medicine? Side effects that you should report to your doctor or health care professional as soon as possible:  allergic reactions like skin rash, itching or hives, swelling of the face, lips, or tongue  breathing problems  chest pain or tightness  fever, chills  headache with nausea, vomiting  neck pain or difficulty moving neck  pain when moving eyes  pain, swelling, warmth in the leg  problems with balance, talking, walking  sudden weight gain  swelling of the ankles, feet, hands  trouble passing urine or change in the amount of urine Side effects that usually do not require medical attention (report to your doctor or health care professional if they continue or are bothersome):  dizzy, drowsy  flushing  increased sweating  leg cramps  muscle aches and pains  pain at site where injected This list may not describe all possible side  effects. Call your doctor for medical advice about side effects. You may report side effects to FDA at 1-800-FDA-1088. Where should I keep my medicine? Keep out of the reach of children. This drug is usually given in a hospital or clinic and will not be stored at home. In rare cases, some brands of this medicine may be given at home. If you are using this medicine at home, you will be instructed on how to store this medicine. Throw away any unused medicine after the expiration date on the label. NOTE: This sheet is a summary. It may not cover all possible information. If you have questions about this medicine, talk to your doctor, pharmacist, or health care provider.  2020 Elsevier/Gold Standard (2008-10-19 11:44:49)

## 2019-08-09 DIAGNOSIS — R41841 Cognitive communication deficit: Secondary | ICD-10-CM | POA: Diagnosis not present

## 2019-08-09 DIAGNOSIS — R1311 Dysphagia, oral phase: Secondary | ICD-10-CM | POA: Diagnosis not present

## 2019-08-09 LAB — PROTEIN ELECTROPHORESIS, SERUM, WITH REFLEX
A/G Ratio: 1.2 (ref 0.7–1.7)
Albumin ELP: 3 g/dL (ref 2.9–4.4)
Alpha-1-Globulin: 0.3 g/dL (ref 0.0–0.4)
Alpha-2-Globulin: 0.7 g/dL (ref 0.4–1.0)
Beta Globulin: 0.7 g/dL (ref 0.7–1.3)
Gamma Globulin: 0.9 g/dL (ref 0.4–1.8)
Globulin, Total: 2.5 g/dL (ref 2.2–3.9)
M-Spike, %: 0.6 g/dL — ABNORMAL HIGH
SPEP Interpretation: 0
Total Protein ELP: 5.5 g/dL — ABNORMAL LOW (ref 6.0–8.5)

## 2019-08-09 LAB — IMMUNOFIXATION REFLEX, SERUM
IgA: <5 mg/dL — ABNORMAL LOW (ref 61–437)
IgG (Immunoglobin G), Serum: 1266 mg/dL (ref 603–1613)
IgM (Immunoglobulin M), Srm: 26 mg/dL (ref 15–143)

## 2019-08-10 DIAGNOSIS — M6281 Muscle weakness (generalized): Secondary | ICD-10-CM | POA: Diagnosis not present

## 2019-08-10 DIAGNOSIS — R262 Difficulty in walking, not elsewhere classified: Secondary | ICD-10-CM | POA: Diagnosis not present

## 2019-08-10 DIAGNOSIS — R1311 Dysphagia, oral phase: Secondary | ICD-10-CM | POA: Diagnosis not present

## 2019-08-10 DIAGNOSIS — R2681 Unsteadiness on feet: Secondary | ICD-10-CM | POA: Diagnosis not present

## 2019-08-10 DIAGNOSIS — R41841 Cognitive communication deficit: Secondary | ICD-10-CM | POA: Diagnosis not present

## 2019-08-11 DIAGNOSIS — R41841 Cognitive communication deficit: Secondary | ICD-10-CM | POA: Diagnosis not present

## 2019-08-11 DIAGNOSIS — R1311 Dysphagia, oral phase: Secondary | ICD-10-CM | POA: Diagnosis not present

## 2019-08-21 ENCOUNTER — Other Ambulatory Visit: Payer: Self-pay | Admitting: Hematology & Oncology

## 2019-08-21 DIAGNOSIS — R634 Abnormal weight loss: Secondary | ICD-10-CM

## 2019-08-21 DIAGNOSIS — T451X5A Adverse effect of antineoplastic and immunosuppressive drugs, initial encounter: Secondary | ICD-10-CM

## 2019-08-21 DIAGNOSIS — C9002 Multiple myeloma in relapse: Secondary | ICD-10-CM

## 2019-08-21 DIAGNOSIS — R112 Nausea with vomiting, unspecified: Secondary | ICD-10-CM

## 2019-08-26 ENCOUNTER — Ambulatory Visit: Payer: Medicare HMO | Admitting: Internal Medicine

## 2019-08-26 ENCOUNTER — Encounter: Payer: Self-pay | Admitting: Internal Medicine

## 2019-08-26 ENCOUNTER — Other Ambulatory Visit: Payer: Self-pay

## 2019-08-26 VITALS — BP 155/76 | HR 80 | Temp 98.3°F | Ht 70.0 in | Wt 144.0 lb

## 2019-08-26 DIAGNOSIS — C9002 Multiple myeloma in relapse: Secondary | ICD-10-CM | POA: Diagnosis not present

## 2019-08-26 DIAGNOSIS — M272 Inflammatory conditions of jaws: Secondary | ICD-10-CM

## 2019-08-26 DIAGNOSIS — R197 Diarrhea, unspecified: Secondary | ICD-10-CM | POA: Diagnosis not present

## 2019-08-26 NOTE — Progress Notes (Signed)
Patient ID: Christian Burns, male   DOB: 09-25-1929, 86 y.o.   MRN: [id number]  HPI 86yo M with MM treated with bendamustine  By dr Lajoyce Lauber we last saw him, he was previously on revlemid which he did not respond any more thus changed to bendamustine. PMHX : also hx of CVA, and jaw osteomyelitis with penicillin. Last seen in sept 2020. He was hospitalized for pneumonia and neutropenia just prior to thanksgiving. He continues on penicillin, pain improved, but he avoids eating on the right side.   ROS: 12 point ros except, weight loss from hospitalization in the fall. 140#.  New onset of diarrhea- had 3 stools today (the last of which is watery). He notices having irregular BM of late   Outpatient Encounter Medications as of 08/26/2019  Medication Sig  . amLODipine-benazepril (LOTREL) 5-10 MG capsule TAKE ONE CAPSULE EACH DAY (Patient taking differently: Take 1 capsule by mouth daily. )  . aspirin EC 81 MG EC tablet Take 1 tablet (81 mg total) by mouth daily.  Marland Kitchen dextromethorphan-guaiFENesin (MUCINEX DM) 30-600 MG 12hr tablet Take 1 tablet by mouth 2 (two) times daily as needed for cough. (Patient not taking: Reported on 06/22/2019)  . dronabinol (MARINOL) 5 MG capsule TAKE ONE CAPSULE TWICE DAILY BEFORE MEALS  . finasteride (PROSCAR) 5 MG tablet TAKE ONE TABLET EACH DAY (Patient taking differently: Take 5 mg by mouth daily. )  . LORazepam (ATIVAN) 0.5 MG tablet Take 1 tablet (0.5 mg total) by mouth every 6 (six) hours as needed (Nausea or vomiting).  . Multiple Vitamin (MULTIVITAMIN WITH MINERALS) TABS tablet Take 1 tablet by mouth daily.  . ondansetron (ZOFRAN) 8 MG tablet Take 1 tablet (8 mg total) by mouth 2 (two) times daily as needed for refractory nausea / vomiting. Start on day 2 after Bendamustine chemotherapy. (Patient not taking: Reported on 08/04/2019)  . penicillin v potassium (VEETID) 500 MG tablet Take 1 tablet (500 mg total) by mouth 4 (four) times daily. #120 (Patient taking  differently: Take 500 mg by mouth 4 (four) times daily. )  . polyvinyl alcohol (LIQUIFILM TEARS) 1.4 % ophthalmic solution Place 1 drop into both eyes as needed for dry eyes.  . predniSONE (DELTASONE) 20 MG tablet Take 1 tablet (20 mg total) by mouth daily with breakfast.  . Probiotic Product (PROBIOTIC PO) Take 1 capsule by mouth daily.  . prochlorperazine (COMPAZINE) 10 MG tablet Take 1 tablet (10 mg total) by mouth every 6 (six) hours as needed (nausea and vomiting). (Patient not taking: Reported on 08/04/2019)  . pyridOXINE (VITAMIN B-6) 100 MG tablet Take 100 mg by mouth daily.   . traMADol (ULTRAM) 50 MG tablet Take 1 tablet (50 mg total) by mouth every 8 (eight) hours as needed for moderate pain.  . vitamin B-12 (CYANOCOBALAMIN) 500 MCG tablet Take 500 mcg by mouth daily.  . Vitamin D, Ergocalciferol, (DRISDOL) 50000 UNITS CAPS capsule Take 50,000 Units by mouth every Sunday.    No facility-administered encounter medications on file as of 08/26/2019.     Patient Active Problem List   Diagnosis Date Noted  . Protein-calorie malnutrition, severe 06/24/2019  . Leukopenia due to antineoplastic chemotherapy ([place]) 06/23/2019  . Nausea 06/23/2019  . Diarrhea 06/23/2019  . CKD (chronic kidney disease), stage III 06/23/2019  . HCAP (healthcare-associated pneumonia) 06/22/2019  . Hyperlipidemia LDL goal <70 04/20/2019  . Essential hypertension 04/20/2019  . Advanced age 31/03/2019  . Stroke ([place]) 04/19/2019  . Community acquired pneumonia 03/03/2019  .  AKI (acute kidney injury) ([place]) 03/03/2019  . Normocytic anemia 03/03/2019  . Goals of care, counseling/discussion 02/04/2019  . Acute osteomyelitis of jaw 11/26/2018  . Bacteremia   . 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 Sepsis due to pneumonia ([place])   . Left lower lobe pneumonia 10/06/2018  . Right hip pain 09/01/2018  . Iron deficiency anemia secondary to inadequate dietary iron intake 07/18/2016  . Multiple myeloma  in relapse ([place]) 04/05/2016  . 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 Multiple myeloma in remission ([place]) 09/08/2015  . Myeloma ([place]) 08/23/2011     Health Maintenance Due  Topic Date Due  . TETANUS/TDAP  05/01/1951  . PNA vac Low Risk Adult (1 of 2 - PCV13) 04/30/1997     Physical Exam   Ht 5' 10"  (1.778 m)   Wt 144 lb (65.3 kg)   BMI 20.66 kg/m    Physical Exam  Constitutional: He is oriented to person, place, and time. He appears well-developed and well-nourished. No distress.  HENT:  Mouth/Throat: Oropharynx is clear and moist. No oropharyngeal exudate.  Cardiovascular: Normal rate, regular rhythm and normal heart sounds. Exam reveals no gallop and no friction rub.  No murmur heard.  Pulmonary/Chest: Effort normal and breath sounds normal. No respiratory distress. He has no wheezes.  Abdominal: Soft. Bowel sounds are normal. He exhibits no distension. There is no tenderness.  Lymphadenopathy:  He has no cervical adenopathy.  Neurological: He is alert and oriented to person, place, and time.  Skin: Skin is warm and dry. No rash noted. No erythema.  Psychiatric: He has a normal mood and affect. His behavior is normal.    CBC Lab Results  Component Value Date   WBC 4.9 08/04/2019   RBC 2.86 (L) 08/04/2019   HGB 9.0 (L) 08/04/2019   HCT 27.0 (L) 08/04/2019   PLT 126 (L) 08/04/2019   MCV 94.4 08/04/2019   MCH 31.5 08/04/2019   MCHC 33.3 08/04/2019   RDW 15.9 (H) 08/04/2019   LYMPHSABS 0.1 (L) 08/04/2019   MONOABS 0.8 08/04/2019   EOSABS 0.1 08/04/2019    BMET Lab Results  Component Value Date   NA 140 08/04/2019   K 3.6 08/04/2019   CL 108 08/04/2019   CO2 26 08/04/2019   GLUCOSE 106 (H) 08/04/2019   BUN 34 (H) 08/04/2019   CREATININE 1.39 (H) 08/04/2019   CALCIUM 8.3 (L) 08/04/2019   GFRNONAA 45 (L) 08/04/2019   GFRAA 52 (L) 08/04/2019    Assessment and Plan  Osteomyelitis of jaw = will check sed rate and crp. Plan to continue with  chronic suppression of penicillin.  Diarrhea = if continues to have watery diarrhea recommend that we test for cdifficile  multilple myeloma = continue on current regimen per dr Martha Clan. May also be cause for elevated sed rate in this pas year  covid vaccine = to get in 1 week! Through [facility].

## 2019-08-27 LAB — SEDIMENTATION RATE: Sed Rate: 48 mm/h — ABNORMAL HIGH (ref 0–20)

## 2019-08-27 LAB — C-REACTIVE PROTEIN: CRP: 29.3 mg/L — ABNORMAL HIGH (ref <8.0)

## 2019-08-31 ENCOUNTER — Other Ambulatory Visit: Payer: Medicare HMO

## 2019-08-31 ENCOUNTER — Inpatient Hospital Stay: Payer: Medicare HMO

## 2019-08-31 ENCOUNTER — Inpatient Hospital Stay: Payer: Medicare HMO | Attending: Hematology & Oncology

## 2019-08-31 ENCOUNTER — Other Ambulatory Visit: Payer: Self-pay

## 2019-08-31 ENCOUNTER — Inpatient Hospital Stay (HOSPITAL_BASED_OUTPATIENT_CLINIC_OR_DEPARTMENT_OTHER): Payer: Medicare HMO | Admitting: Hematology & Oncology

## 2019-08-31 ENCOUNTER — Telehealth: Payer: Self-pay | Admitting: Hematology & Oncology

## 2019-08-31 VITALS — BP 153/105 | HR 75 | Temp 97.4°F | Wt 143.4 lb

## 2019-08-31 DIAGNOSIS — Z882 Allergy status to sulfonamides status: Secondary | ICD-10-CM | POA: Diagnosis not present

## 2019-08-31 DIAGNOSIS — Z5111 Encounter for antineoplastic chemotherapy: Secondary | ICD-10-CM | POA: Insufficient documentation

## 2019-08-31 DIAGNOSIS — C9000 Multiple myeloma not having achieved remission: Secondary | ICD-10-CM

## 2019-08-31 DIAGNOSIS — Z79899 Other long term (current) drug therapy: Secondary | ICD-10-CM | POA: Diagnosis not present

## 2019-08-31 DIAGNOSIS — C9002 Multiple myeloma in relapse: Secondary | ICD-10-CM | POA: Diagnosis not present

## 2019-08-31 DIAGNOSIS — D649 Anemia, unspecified: Secondary | ICD-10-CM | POA: Insufficient documentation

## 2019-08-31 DIAGNOSIS — R531 Weakness: Secondary | ICD-10-CM | POA: Insufficient documentation

## 2019-08-31 DIAGNOSIS — C9001 Multiple myeloma in remission: Secondary | ICD-10-CM | POA: Diagnosis not present

## 2019-08-31 DIAGNOSIS — M791 Myalgia, unspecified site: Secondary | ICD-10-CM | POA: Diagnosis not present

## 2019-08-31 DIAGNOSIS — N289 Disorder of kidney and ureter, unspecified: Secondary | ICD-10-CM | POA: Insufficient documentation

## 2019-08-31 DIAGNOSIS — R634 Abnormal weight loss: Secondary | ICD-10-CM | POA: Insufficient documentation

## 2019-08-31 DIAGNOSIS — D508 Other iron deficiency anemias: Secondary | ICD-10-CM

## 2019-08-31 LAB — CBC WITH DIFFERENTIAL (CANCER CENTER ONLY)
Abs Immature Granulocytes: 0.28 10*3/uL — ABNORMAL HIGH (ref 0.00–0.07)
Basophils Absolute: 0 10*3/uL (ref 0.0–0.1)
Basophils Relative: 1 %
Eosinophils Absolute: 0.1 10*3/uL (ref 0.0–0.5)
Eosinophils Relative: 2 %
HCT: 26.5 % — ABNORMAL LOW (ref 39.0–52.0)
Hemoglobin: 8.9 g/dL — ABNORMAL LOW (ref 13.0–17.0)
Immature Granulocytes: 7 %
Lymphocytes Relative: 1 %
Lymphs Abs: 0 10*3/uL — ABNORMAL LOW (ref 0.7–4.0)
MCH: 32 pg (ref 26.0–34.0)
MCHC: 33.6 g/dL (ref 30.0–36.0)
MCV: 95.3 fL (ref 80.0–100.0)
Monocytes Absolute: 0.7 10*3/uL (ref 0.1–1.0)
Monocytes Relative: 17 %
Neutro Abs: 2.7 10*3/uL (ref 1.7–7.7)
Neutrophils Relative %: 72 %
Platelet Count: 132 10*3/uL — ABNORMAL LOW (ref 150–400)
RBC: 2.78 MIL/uL — ABNORMAL LOW (ref 4.22–5.81)
RDW: 15.7 % — ABNORMAL HIGH (ref 11.5–15.5)
WBC Count: 3.8 10*3/uL — ABNORMAL LOW (ref 4.0–10.5)
nRBC: 0 % (ref 0.0–0.2)

## 2019-08-31 LAB — CMP (CANCER CENTER ONLY)
ALT: 14 U/L (ref 0–44)
AST: 24 U/L (ref 15–41)
Albumin: 3.2 g/dL — ABNORMAL LOW (ref 3.5–5.0)
Alkaline Phosphatase: 50 U/L (ref 38–126)
Anion gap: 7 (ref 5–15)
BUN: 33 mg/dL — ABNORMAL HIGH (ref 8–23)
CO2: 26 mmol/L (ref 22–32)
Calcium: 8.8 mg/dL — ABNORMAL LOW (ref 8.9–10.3)
Chloride: 106 mmol/L (ref 98–111)
Creatinine: 1.63 mg/dL — ABNORMAL HIGH (ref 0.61–1.24)
GFR, Est AFR Am: 43 mL/min — ABNORMAL LOW (ref >60)
GFR, Estimated: 37 mL/min — ABNORMAL LOW (ref >60)
Glucose, Bld: 112 mg/dL — ABNORMAL HIGH (ref 70–99)
Potassium: 3.7 mmol/L (ref 3.5–5.1)
Sodium: 139 mmol/L (ref 135–145)
Total Bilirubin: 0.6 mg/dL (ref 0.3–1.2)
Total Protein: 5.9 g/dL — ABNORMAL LOW (ref 6.5–8.1)

## 2019-08-31 LAB — FERRITIN: Ferritin: 547 ng/mL — ABNORMAL HIGH (ref 24–336)

## 2019-08-31 LAB — IRON AND TIBC
Iron: 107 ug/dL (ref 42–163)
Saturation Ratios: 49 % (ref 20–55)
TIBC: 220 ug/dL (ref 202–409)
UIBC: 113 ug/dL — ABNORMAL LOW (ref 117–376)

## 2019-08-31 MED ORDER — DIPHENHYDRAMINE HCL 25 MG PO CAPS
ORAL_CAPSULE | ORAL | Status: AC
  Filled 2019-08-31: qty 1

## 2019-08-31 MED ORDER — SODIUM CHLORIDE 0.9 % IV SOLN
75.0000 mg/m2 | Freq: Once | INTRAVENOUS | Status: AC
  Administered 2019-08-31: 125 mg via INTRAVENOUS
  Filled 2019-08-31: qty 5

## 2019-08-31 MED ORDER — DIPHENHYDRAMINE HCL 25 MG PO CAPS
25.0000 mg | ORAL_CAPSULE | Freq: Once | ORAL | Status: AC
  Administered 2019-08-31: 25 mg via ORAL

## 2019-08-31 MED ORDER — DEXTROSE 5 % IV SOLN
Freq: Once | INTRAVENOUS | Status: AC
  Filled 2019-08-31: qty 250

## 2019-08-31 MED ORDER — ACETAMINOPHEN 325 MG PO TABS
ORAL_TABLET | ORAL | Status: AC
  Filled 2019-08-31: qty 2

## 2019-08-31 MED ORDER — EPOETIN ALFA-EPBX 10000 UNIT/ML IJ SOLN
INTRAMUSCULAR | Status: AC
  Filled 2019-08-31: qty 2

## 2019-08-31 MED ORDER — IMMUNE GLOBULIN (HUMAN) 20 GM/200ML IV SOLN
40.0000 g | Freq: Once | INTRAVENOUS | Status: AC
  Administered 2019-08-31: 40 g via INTRAVENOUS
  Filled 2019-08-31: qty 400

## 2019-08-31 MED ORDER — DEXAMETHASONE SODIUM PHOSPHATE 10 MG/ML IJ SOLN
10.0000 mg | Freq: Once | INTRAMUSCULAR | Status: AC
  Administered 2019-08-31: 10 mg via INTRAVENOUS

## 2019-08-31 MED ORDER — PALONOSETRON HCL INJECTION 0.25 MG/5ML
0.2500 mg | Freq: Once | INTRAVENOUS | Status: AC
  Administered 2019-08-31: 0.25 mg via INTRAVENOUS

## 2019-08-31 MED ORDER — DEXAMETHASONE SODIUM PHOSPHATE 10 MG/ML IJ SOLN
INTRAMUSCULAR | Status: AC
  Filled 2019-08-31: qty 1

## 2019-08-31 MED ORDER — ACETAMINOPHEN 325 MG PO TABS
650.0000 mg | ORAL_TABLET | Freq: Once | ORAL | Status: AC
  Administered 2019-08-31: 650 mg via ORAL

## 2019-08-31 MED ORDER — SODIUM CHLORIDE 0.9 % IV SOLN
Freq: Once | INTRAVENOUS | Status: DC
  Filled 2019-08-31: qty 250

## 2019-08-31 MED ORDER — PALONOSETRON HCL INJECTION 0.25 MG/5ML
INTRAVENOUS | Status: AC
  Filled 2019-08-31: qty 5

## 2019-08-31 NOTE — Progress Notes (Signed)
Reviewed all labwork with Dr. Marin Olp.  Ok to give chemo and IVIG today.  No retacrit today.

## 2019-08-31 NOTE — Telephone Encounter (Signed)
Appointments were scheduled previously/ 1/19 los

## 2019-08-31 NOTE — Patient Instructions (Signed)
Immune Globulin Injection What is this medicine? IMMUNE GLOBULIN (im MUNE GLOB yoo lin) helps to prevent or reduce the severity of certain infections in patients who are at risk. This medicine is collected from the pooled blood of many donors. It is used to treat immune system problems, thrombocytopenia, and Kawasaki syndrome. This medicine may be used for other purposes; ask your health care provider or pharmacist if you have questions. COMMON BRAND NAME(S): ASCENIV, Baygam, BIVIGAM, Carimune, Carimune NF, cutaquig, Cuvitru, Flebogamma, Flebogamma DIF, GamaSTAN, GamaSTAN S/D, Gamimune N, Gammagard, Gammagard S/D, Gammaked, Gammaplex, Gammar-P IV, Gamunex, Gamunex-C, Hizentra, Iveegam, Iveegam EN, Octagam, Panglobulin, Panglobulin NF, panzyga, Polygam S/D, Privigen, Sandoglobulin, Venoglobulin-S, Vigam, Vivaglobulin, Xembify What should I tell my health care provider before I take this medicine? They need to know if you have any of these conditions:  diabetes  extremely low or no immune antibodies in the blood  heart disease  history of blood clots  hyperprolinemia  infection in the blood, sepsis  kidney disease  recently received or scheduled to receive a vaccination  an unusual or allergic reaction to human immune globulin, albumin, maltose, sucrose, other medicines, foods, dyes, or preservatives  pregnant or trying to get pregnant  breast-feeding How should I use this medicine? This medicine is for injection into a muscle or infusion into a vein or skin. It is usually given by a health care professional in a hospital or clinic setting. In rare cases, some brands of this medicine might be given at home. You will be taught how to give this medicine. Use exactly as directed. Take your medicine at regular intervals. Do not take your medicine more often than directed. Talk to your pediatrician regarding the use of this medicine in children. While this drug may be prescribed for selected  conditions, precautions do apply. Overdosage: If you think you have taken too much of this medicine contact a poison control center or emergency room at once. NOTE: This medicine is only for you. Do not share this medicine with others. What if I miss a dose? It is important not to miss your dose. Call your doctor or health care professional if you are unable to keep an appointment. If you give yourself the medicine and you miss a dose, take it as soon as you can. If it is almost time for your next dose, take only that dose. Do not take double or extra doses. What may interact with this medicine?  aspirin and aspirin-like medicines  cisplatin  cyclosporine  medicines for infection like acyclovir, adefovir, amphotericin B, bacitracin, cidofovir, foscarnet, ganciclovir, gentamicin, pentamidine, vancomycin  NSAIDS, medicines for pain and inflammation, like ibuprofen or naproxen  pamidronate  vaccines  zoledronic acid This list may not describe all possible interactions. Give your health care provider a list of all the medicines, herbs, non-prescription drugs, or dietary supplements you use. Also tell them if you smoke, drink alcohol, or use illegal drugs. Some items may interact with your medicine. What should I watch for while using this medicine? Your condition will be monitored carefully while you are receiving this medicine. This medicine is made from pooled blood donations of many different people. It may be possible to pass an infection in this medicine. However, the donors are screened for infections and all products are tested for HIV and hepatitis. The medicine is treated to kill most or all bacteria and viruses. Talk to your doctor about the risks and benefits of this medicine. Do not have vaccinations for at least   14 days before, or until at least 3 months after receiving this medicine. What side effects may I notice from receiving this medicine? Side effects that you should report  to your doctor or health care professional as soon as possible:  allergic reactions like skin rash, itching or hives, swelling of the face, lips, or tongue  blue colored lips or skin  breathing problems  chest pain or tightness  fever  signs and symptoms of aseptic meningitis such as stiff neck; sensitivity to light; headache; drowsiness; fever; nausea; vomiting; rash  signs and symptoms of a blood clot such as chest pain; shortness of breath; pain, swelling, or warmth in the leg  signs and symptoms of hemolytic anemia such as fast heartbeat; tiredness; dark yellow or brown urine; or yellowing of the eyes or skin  signs and symptoms of kidney injury like trouble passing urine or change in the amount of urine  sudden weight gain  swelling of the ankles, feet, hands Side effects that usually do not require medical attention (report to your doctor or health care professional if they continue or are bothersome):  diarrhea  flushing  headache  increased sweating  joint pain  muscle cramps  muscle pain  nausea  pain, redness, or irritation at site where injected  tiredness This list may not describe all possible side effects. Call your doctor for medical advice about side effects. You may report side effects to FDA at 1-800-FDA-1088. Where should I keep my medicine? Keep out of the reach of children. This drug is usually given in a hospital or clinic and will not be stored at home. In rare cases, some brands of this medicine may be given at home. If you are using this medicine at home, you will be instructed on how to store this medicine. Throw away any unused medicine after the expiration date on the label. NOTE: This sheet is a summary. It may not cover all possible information. If you have questions about this medicine, talk to your doctor, pharmacist, or health care provider.  2020 Elsevier/Gold Standard (2019-03-03 12:51:14)  

## 2019-08-31 NOTE — Progress Notes (Signed)
Hematology and Oncology Follow Up Visit  Christian Burns [id number] 1929-09-17 86 y.o. 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 Palliative radiation therapy to right ilium Kyprolis/Cytoxanq 3 week dosing- s/p cycle 24 (Cytoxan restarted on cycle 13) - DC'd due to progression Radiation therapy to right femur, 10 fraction --completed 3000 rad on 11/06/2018 Elotuzomab - started 10/29/2018 -- s/p cycle #1 -- d/c on 12/24/2018 Pomalidomide 2 mg po q day (21 on/7 off) - started 10/29/2018 -- d/c 12/31/2018 Daratumumab - start cycle # 1 on 12/31/2018 -- d/c on -02/04/2019  Current Therapy:   Bendamustine/Decadron -- started on 02/10/2019, s/p cycle #7 Zometa IV q 4 weeks - on hold Aranesp 300 g subcutaneous as needed for hemoglobin less than 10 IVIG 40g IV q month -- start on 07/2019   Interim History:  Christian Burns is here today for follow-up and treatment.  He actually is doing quite well.  Our goal was to try to get him through Christmas.  We made it through Christmas.  He looks quite good.  His blood pressure is on the high side.  We really cannot give him Aranesp today because of this.  His light chains continue to improve.  His last lambda light chain was down to 13.4 mg/dL.  His appetite is doing okay.  The Marinol seems to help quite a bit.  Overall, I think his quality of life seems to be doing a little better.  I am happy about this.  His main focus has always been to care for his wife who is crippled with arthritis.  Overall, I would say his performance status is probably ECOG 2.    Medications:  Allergies as of 08/31/2019      Reactions   Sulfa Antibiotics Itching   Sulfasalazine Itching      Medication List       Accurate as of August 31, 2019  9:00 AM. If you have any questions, ask your nurse or doctor.        ALOXI IV Inject into the vein.   amLODipine-benazepril 5-10 MG capsule Commonly known as: LOTREL TAKE ONE CAPSULE EACH DAY What changed: See the new instructions.   aspirin 81 MG EC tablet Take 1 tablet (81 mg total) by mouth daily.   bendamustine in sodium chloride 0.9 % 50 mL Inject into the vein once.   dextromethorphan-guaiFENesin 30-600 MG 12hr tablet Commonly known as: MUCINEX DM Take 1 tablet by mouth 2 (two) times daily as needed for cough.   dronabinol 5 MG capsule Commonly known as: MARINOL TAKE ONE CAPSULE TWICE DAILY BEFORE MEALS   finasteride 5 MG tablet Commonly known as: PROSCAR TAKE ONE TABLET EACH DAY What changed: See the new instructions.   LORazepam 0.5 MG tablet Commonly known as: Ativan Take 1 tablet (0.5 mg total) by mouth every 6 (six) hours as needed (Nausea or vomiting).   multivitamin with minerals Tabs tablet Take 1 tablet by mouth daily.   ondansetron 8 MG tablet Commonly known as: Zofran Take 1 tablet (8 mg total) by mouth 2 (two) times daily as needed for refractory nausea / vomiting. Start on day 2 after Bendamustine chemotherapy.   penicillin v potassium 500 MG tablet Commonly known as: VEETID Take 1 tablet (  500 mg total) by mouth 4 (four) times daily. #120 What changed: additional instructions   polyvinyl alcohol 1.4 % ophthalmic solution Commonly known as: LIQUIFILM TEARS Place 1 drop into both eyes as needed for dry eyes.   predniSONE 20 MG tablet Commonly known as: Deltasone Take 1 tablet (20 mg total) by mouth daily with breakfast.   PROBIOTIC PO Take 1 capsule by mouth daily.   prochlorperazine 10 MG tablet Commonly known as: COMPAZINE Take 1 tablet (10 mg total) by mouth every 6 (six) hours as needed (nausea and vomiting).   pyridOXINE 100 MG tablet Commonly known as: VITAMIN B-6 Take  100 mg by mouth daily.   traMADol 50 MG tablet Commonly known as: ULTRAM Take 1 tablet (50 mg total) by mouth every 8 (eight) hours as needed for moderate pain.   vitamin B-12 500 MCG tablet Commonly known as: CYANOCOBALAMIN Take 500 mcg by mouth daily.   Vitamin D (Ergocalciferol) 1.25 MG (50000 UNIT) Caps capsule Commonly known as: DRISDOL Take 50,000 Units by mouth every Sunday.       Allergies:  Allergies  Allergen Reactions  . Sulfa Antibiotics Itching  . Sulfasalazine Itching    Past Medical History, Surgical history, Social history, and Family History were reviewed and updated.  Review of Systems: Review of Systems  Constitutional: Positive for weight loss.  HENT: Negative.   Eyes: Negative.   Cardiovascular: Negative.   Gastrointestinal: Negative.   Genitourinary: Negative.   Musculoskeletal: Positive for myalgias.  Skin: Negative.   Neurological: Positive for focal weakness.  Endo/Heme/Allergies: Negative.   Psychiatric/Behavioral: Negative.      Physical Exam:  weight is 143 lb 6.4 oz (65 kg). His other (comment) temperature is 97.4 F (36.3 C) (abnormal). His blood pressure is 153/105 (abnormal) and his pulse is 75.   Wt Readings from Last 3 Encounters:  08/31/19 143 lb 6.4 oz (65 kg)  08/26/19 144 lb (65.3 kg)  08/05/19 144 lb 1.9 oz (65.4 kg)    Physical Exam Vitals reviewed.  HENT:     Head: Normocephalic and atraumatic.  Eyes:     Pupils: Pupils are equal, round, and reactive to light.  Cardiovascular:     Rate and Rhythm: Normal rate and regular rhythm.     Heart sounds: Normal heart sounds.  Pulmonary:     Effort: Pulmonary effort is normal.     Breath sounds: Normal breath sounds.  Abdominal:     General: Bowel sounds are normal.     Palpations: Abdomen is soft.  Musculoskeletal:        General: No tenderness or deformity. Normal range of motion.     Cervical back: Normal range of motion.  Lymphadenopathy:     Cervical: No  cervical adenopathy.  Skin:    General: Skin is warm and dry.     Findings: No erythema or rash.  Neurological:     Mental Status: He is alert and oriented to person, place, and time.  Psychiatric:        Behavior: Behavior normal.        Thought Content: Thought content normal.        Judgment: Judgment normal.      Lab Results  Component Value Date   WBC 3.8 (L) 08/31/2019   HGB 8.9 (L) 08/31/2019   HCT 26.5 (L) 08/31/2019   MCV 95.3 08/31/2019   PLT 132 (L) 08/31/2019   Lab Results  Component Value Date   FERRITIN 1,723 (H) 07/06/2019  IRON 110 07/06/2019   TIBC 204 07/06/2019   UIBC 94 (L) 07/06/2019   IRONPCTSAT 54 07/06/2019   Lab Results  Component Value Date   RETICCTPCT 1.3 12/10/2018   RBC 2.78 (L) 08/31/2019   Lab Results  Component Value Date   KPAFRELGTCHN 1.5 (L) 08/04/2019   LAMBDASER 134.4 (H) 08/04/2019   KAPLAMBRATIO 0.01 (L) 08/04/2019   Lab Results  Component Value Date   IGGSERUM 1,098 08/04/2019   IGGSERUM 1,266 08/04/2019   IGA 7 (L) 08/04/2019   IGA <5 (L) 08/04/2019   IGMSERUM 22 08/04/2019   IGMSERUM 26 08/04/2019   Lab Results  Component Value Date   TOTALPROTELP 5.5 (L) 08/04/2019   ALBUMINELP 3.0 08/04/2019   A1GS 0.3 08/04/2019   A2GS 0.7 08/04/2019   BETS 0.7 08/04/2019   BETA2SER 0.3 07/28/2015   GAMS 0.9 08/04/2019   MSPIKE 0.6 (H) 08/04/2019   SPEI Comment 07/06/2019     Chemistry      Component Value Date/Time   NA 140 08/04/2019 0927   NA 146 (H) 08/07/2017 1153   NA 141 01/09/2017 1004   K 3.6 08/04/2019 0927   K 4.6 08/07/2017 1153   K 4.4 01/09/2017 1004   CL 108 08/04/2019 0927   CL 108 08/07/2017 1153   CO2 26 08/04/2019 0927   CO2 26 08/07/2017 1153   CO2 22 01/09/2017 1004   BUN 34 (H) 08/04/2019 0927   BUN 24 (H) 08/07/2017 1153   BUN 23.9 01/09/2017 1004   CREATININE 1.39 (H) 08/04/2019 0927   CREATININE 1.56 (H) 05/26/2018 1508   CREATININE 1.5 (H) 01/09/2017 1004      Component Value  Date/Time   CALCIUM 8.3 (L) 08/04/2019 0927   CALCIUM 8.8 08/07/2017 1153   CALCIUM 8.8 01/09/2017 1004   ALKPHOS 51 08/04/2019 0927   ALKPHOS 97 (H) 08/07/2017 1153   ALKPHOS 80 01/09/2017 1004   AST 23 08/04/2019 0927   AST 18 01/09/2017 1004   ALT 15 08/04/2019 0927   ALT 20 08/07/2017 1153   ALT 12 01/09/2017 1004   BILITOT 0.6 08/04/2019 0927   BILITOT 0.92 01/09/2017 1004       Impression and Plan: Mr. Surrency is a very pleasant 86 yo caucasian gentleman with relapsed IgG lambda myeloma.    He continues to have a nice response to treatment. His counts are looking much better.   We will have to watch his hemoglobin relatively closely.  We really cannot give him Aranesp with his blood pressure of the way that it is.  We may have to think about adjusting his blood pressure medicine a little bit more.  He will have his bendamustine today and tomorrow.  He will get his IVIG today.  We will see him back in another month.  Hopefully, our next goal is to get him through the Easter holiday.  [name], MD 1/19/20219:00 AM

## 2019-09-01 ENCOUNTER — Inpatient Hospital Stay: Payer: Medicare HMO

## 2019-09-01 ENCOUNTER — Ambulatory Visit: Payer: Medicare HMO | Admitting: Adult Health

## 2019-09-01 VITALS — BP 178/85 | HR 75 | Temp 97.1°F | Resp 17

## 2019-09-01 DIAGNOSIS — Z5111 Encounter for antineoplastic chemotherapy: Secondary | ICD-10-CM | POA: Diagnosis not present

## 2019-09-01 DIAGNOSIS — C9000 Multiple myeloma not having achieved remission: Secondary | ICD-10-CM

## 2019-09-01 DIAGNOSIS — C9002 Multiple myeloma in relapse: Secondary | ICD-10-CM

## 2019-09-01 LAB — PROTEIN ELECTROPHORESIS, SERUM
A/G Ratio: 1.3 (ref 0.7–1.7)
Albumin ELP: 3 g/dL (ref 2.9–4.4)
Alpha-1-Globulin: 0.3 g/dL (ref 0.0–0.4)
Alpha-2-Globulin: 0.7 g/dL (ref 0.4–1.0)
Beta Globulin: 0.7 g/dL (ref 0.7–1.3)
Gamma Globulin: 0.8 g/dL (ref 0.4–1.8)
Globulin, Total: 2.4 g/dL (ref 2.2–3.9)
M-Spike, %: 0.5 g/dL — ABNORMAL HIGH
Total Protein ELP: 5.4 g/dL — ABNORMAL LOW (ref 6.0–8.5)

## 2019-09-01 LAB — KAPPA/LAMBDA LIGHT CHAINS
Kappa free light chain: 1.4 mg/L — ABNORMAL LOW (ref 3.3–19.4)
Kappa, lambda light chain ratio: 0.02 — ABNORMAL LOW (ref 0.26–1.65)
Lambda free light chains: 85 mg/L — ABNORMAL HIGH (ref 5.7–26.3)

## 2019-09-01 LAB — IGG, IGA, IGM
IgA: 5 mg/dL — ABNORMAL LOW (ref 61–437)
IgG (Immunoglobin G), Serum: 966 mg/dL (ref 603–1613)
IgM (Immunoglobulin M), Srm: 12 mg/dL — ABNORMAL LOW (ref 15–143)

## 2019-09-01 MED ORDER — SODIUM CHLORIDE 0.9 % IV SOLN
75.0000 mg/m2 | Freq: Once | INTRAVENOUS | Status: AC
  Administered 2019-09-01: 125 mg via INTRAVENOUS
  Filled 2019-09-01: qty 5

## 2019-09-01 MED ORDER — DEXAMETHASONE SODIUM PHOSPHATE 10 MG/ML IJ SOLN
INTRAMUSCULAR | Status: AC
  Filled 2019-09-01: qty 1

## 2019-09-01 MED ORDER — DEXAMETHASONE SODIUM PHOSPHATE 10 MG/ML IJ SOLN
10.0000 mg | Freq: Once | INTRAMUSCULAR | Status: AC
  Administered 2019-09-01: 10 mg via INTRAVENOUS

## 2019-09-01 MED ORDER — SODIUM CHLORIDE 0.9 % IV SOLN
Freq: Once | INTRAVENOUS | Status: AC
  Filled 2019-09-01: qty 250

## 2019-09-01 NOTE — Patient Instructions (Signed)
Bendamustine Injection What is this medicine? BENDAMUSTINE (BEN da MUS teen) is a chemotherapy drug. It is used to treat chronic lymphocytic leukemia and non-Hodgkin lymphoma. This medicine may be used for other purposes; ask your health care provider or pharmacist if you have questions. COMMON BRAND NAME(S): BELRAPZO, BENDEKA, Treanda What should I tell my health care provider before I take this medicine? They need to know if you have any of these conditions:  infection (especially a virus infection such as chickenpox, cold sores, or herpes)  kidney disease  liver disease  an unusual or allergic reaction to bendamustine, mannitol, other medicines, foods, dyes, or preservatives  pregnant or trying to get pregnant  breast-feeding How should I use this medicine? This medicine is for infusion into a vein. It is given by a health care professional in a hospital or clinic setting. Talk to your pediatrician regarding the use of this medicine in children. Special care may be needed. Overdosage: If you think you have taken too much of this medicine contact a poison control center or emergency room at once. NOTE: This medicine is only for you. Do not share this medicine with others. What if I miss a dose? It is important not to miss your dose. Call your doctor or health care professional if you are unable to keep an appointment. What may interact with this medicine? Do not take this medicine with any of the following medications:  clozapine This medicine may also interact with the following medications:  atazanavir  cimetidine  ciprofloxacin  enoxacin  fluvoxamine  medicines for seizures like carbamazepine and phenobarbital  mexiletine  rifampin  tacrine  thiabendazole  zileuton This list may not describe all possible interactions. Give your health care provider a list of all the medicines, herbs, non-prescription drugs, or dietary supplements you use. Also tell them if  you smoke, drink alcohol, or use illegal drugs. Some items may interact with your medicine. What should I watch for while using this medicine? This drug may make you feel generally unwell. This is not uncommon, as chemotherapy can affect healthy cells as well as cancer cells. Report any side effects. Continue your course of treatment even though you feel ill unless your doctor tells you to stop. You may need blood work done while you are taking this medicine. Call your doctor or healthcare provider for advice if you get a fever, chills or sore throat, or other symptoms of a cold or flu. Do not treat yourself. This drug decreases your body's ability to fight infections. Try to avoid being around people who are sick. This medicine may cause serious skin reactions. They can happen weeks to months after starting the medicine. Contact your healthcare provider right away if you notice fevers or flu-like symptoms with a rash. The rash may be red or purple and then turn into blisters or peeling of the skin. Or, you might notice a red rash with swelling of the face, lips or lymph nodes in your neck or under your arms. This medicine may increase your risk to bruise or bleed. Call your doctor or healthcare provider if you notice any unusual bleeding. Talk to your doctor about your risk of cancer. You may be more at risk for certain types of cancers if you take this medicine. Do not become pregnant while taking this medicine or for at least 6 months after stopping it. Women should inform their doctor if they wish to become pregnant or think they might be pregnant. Men should not   father a child while taking this medicine and for at least 3 months after stopping it. There is a potential for serious side effects to an unborn child. Talk to your healthcare provider or pharmacist for more information. Do not breast-feed an infant while taking this medicine or for at least 1 week after stopping it. This medicine may make it  more difficult to father a child. You should talk with your doctor or healthcare provider if you are concerned about your fertility. What side effects may I notice from receiving this medicine? Side effects that you should report to your doctor or health care professional as soon as possible:  allergic reactions like skin rash, itching or hives, swelling of the face, lips, or tongue  low blood counts - this medicine may decrease the number of white blood cells, red blood cells and platelets. You may be at increased risk for infections and bleeding.  rash, fever, and swollen lymph nodes  redness, blistering, peeling, or loosening of the skin, including inside the mouth  signs of infection like fever or chills, cough, sore throat, pain or difficulty passing urine  signs of decreased platelets or bleeding like bruising, pinpoint red spots on the skin, black, tarry stools, blood in the urine  signs of decreased red blood cells like being unusually weak or tired, fainting spells, lightheadedness  signs and symptoms of kidney injury like trouble passing urine or change in the amount of urine  signs and symptoms of liver injury like dark yellow or brown urine; general ill feeling or flu-like symptoms; light-colored stools; loss of appetite; nausea; right upper belly pain; unusually weak or tired; yellowing of the eyes or skin Side effects that usually do not require medical attention (report to your doctor or health care professional if they continue or are bothersome):  constipation  decreased appetite  diarrhea  headache  mouth sores  nausea, vomiting  tiredness This list may not describe all possible side effects. Call your doctor for medical advice about side effects. You may report side effects to FDA at 1-800-FDA-1088. Where should I keep my medicine? This drug is given in a hospital or clinic and will not be stored at home. NOTE: This sheet is a summary. It may not cover all  possible information. If you have questions about this medicine, talk to your doctor, pharmacist, or health care provider.  2020 [employer]vier/Gold Standard (2018-10-20 10:26:46)  

## 2019-09-02 ENCOUNTER — Telehealth: Payer: Self-pay | Admitting: *Deleted

## 2019-09-02 NOTE — Telephone Encounter (Signed)
As noted below by Dr. Marin Olp, I informed the patient that the light chain is 85. He verbalized understanding.

## 2019-09-02 NOTE — Telephone Encounter (Signed)
-----   Message from [name], MD sent at 09/02/2019 11:32 AM EST ----- Call -the myeloma is down even further!!  The light chain is 85!!! This is a miracle!!  [name]

## 2019-09-06 ENCOUNTER — Ambulatory Visit (INDEPENDENT_AMBULATORY_CARE_PROVIDER_SITE_OTHER): Payer: Medicare HMO | Admitting: *Deleted

## 2019-09-06 DIAGNOSIS — I63412 Cerebral infarction due to embolism of left middle cerebral artery: Secondary | ICD-10-CM | POA: Diagnosis not present

## 2019-09-06 LAB — CUP PACEART REMOTE DEVICE CHECK
Date Time Interrogation Session: 20210124230112
Implantable Pulse Generator Implant Date: 20200909

## 2019-09-08 ENCOUNTER — Other Ambulatory Visit: Payer: Self-pay

## 2019-09-08 ENCOUNTER — Encounter: Payer: Self-pay | Admitting: Adult Health

## 2019-09-08 ENCOUNTER — Ambulatory Visit: Payer: Medicare HMO | Admitting: Adult Health

## 2019-09-08 VITALS — BP 144/62 | HR 93 | Temp 97.9°F | Ht 70.0 in | Wt 144.2 lb

## 2019-09-08 DIAGNOSIS — Z95818 Presence of other cardiac implants and grafts: Secondary | ICD-10-CM

## 2019-09-08 DIAGNOSIS — I639 Cerebral infarction, unspecified: Secondary | ICD-10-CM

## 2019-09-08 DIAGNOSIS — I10 Essential (primary) hypertension: Secondary | ICD-10-CM

## 2019-09-08 NOTE — Patient Instructions (Signed)
Continue aspirin 81 mg daily for secondary stroke prevention  Continue to follow up with PCP regarding blood pressure management   Continue to follow with oncology as scheduled  Continue to monitor blood pressure at home  Continue to monitor loop recorder   Maintain strict control of hypertension with blood pressure goal below 130/90, diabetes with hemoglobin A1c goal below 6.5% and cholesterol with LDL cholesterol (bad cholesterol) goal below 70 mg/dL. I also advised the patient to eat a healthy diet with plenty of whole grains, cereals, fruits and vegetables, exercise regularly and maintain ideal body weight.  Followup in the future with me in 6 months or call earlier if needed       Thank you for coming to see Christian Burns at [facility]. I hope we have been able to provide you high quality care today.  You may receive a patient satisfaction survey over the next few weeks. We would appreciate your feedback and comments so that we may continue to improve ourselves and the health of our patients.

## 2019-09-08 NOTE — Progress Notes (Signed)
[facility] [address] [place]. [place] [address] [phone]       OFFICE FOLLOW UP NOTE  Christian. Christian Burns Date of Birth:  09-Jun-1930 Medical Record Number:  [id number]   Referring MD: Rosalin Hawking  Reason for Referral: Stroke  Chief Complaint  Patient presents with  . Follow-up    Rm treatment rm. Stable.    . Cerebrovascular Accident      HPI:   Initial visit 06/01/2019 PS: Christian Burns is a 86 year old pleasant Caucasian male seen today for initial office consultation visit following hospital admission for stroke.  History is obtained from the patient, review of electronic medical records and I personally reviewed imaging films and PACS.  He is a 86 year old pleasant Caucasian male with past medical history of multiple myeloma status post radiation and currently on chemotherapy, history of hypertension, radiation therapy, chronic kidney disease.  He developed sudden onset of difficulty speaking as noted by his wife.  Patient states he wanted to speak with words did not come out.  EMS noticed right facial droop.  In route to the hospital his speech improved.  He had no prior history of strokes.  His NIH stroke scale was 3 on admission with modified Rankin at baseline of 1.  Patient was administered IV TPA uneventfully.  He was monitored in the neuro ICU closely with strict blood pressure control and did well.  CT scan of the head on admission showed age-indeterminate right internal capsule lacunar and subinsular infarct and changes of small vessel disease.  MRI scan subsequently confirmed a left frontal cortical tiny acute infarct.  MRI of the brain showed no large vessel stenosis or occlusion.  Carotid ultrasound showed no significant extracranial stenosis.  Patient had loop recorder inserted by Dr. Lovena Le on 04/21/2019 and so for paroxysmal A. fib has not yet been found.  LDL cholesterol was 71 mg percent.  Hemoglobin A1c was 5.7.  Patient was on no antithrombotics prior  to admission and was started on dual antiplatelet therapy aspirin 81 and Plavix 75 mg daily which she is tolerating well but does complain of increased bruising though no major bleeding.  He was discharged home with home physical occupational and speech therapy and return to assisted living facility where he lives with his wife.  Patient states is done well.  He has had no recurrent symptoms.  His blood pressure is usually better controlled though it is elevated in office today at 157/81.  Patient has no new complaints today.  He states he is almost back to his baseline.  He denies any prior history of strokes TIAs or significant neurological problems.  Last loop recorder interrogation on 05/30/2019 showed no evidence of atrial fibrillation  Update 09/08/2019: Christian Burns is a 86 year old male who is being seen today for stroke follow-up.  He has been doing well since prior visit without new or reoccurring stroke/TIA symptoms.  He continues to live at [facility] with his wife. Continues on aspirin 81 mg daily without bleeding or bruising.  Blood pressure today 144/62.  Loop recorder is not shown atrial fibrillation thus far. He continues to follow with oncology for multiple myeloma.  No further concerns at this time.     ROS:   14 system review of systems is positive for no complaints all other systems negative  PMH:  Past Medical History:  Diagnosis Date  . CKD (chronic kidney disease) 07/06/2018   CKD stage III  . Goals of care, counseling/discussion 02/04/2019  .  History of radiation therapy 06/17/13-07/06/13   35 gray to upper lumbar spine  . 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 Hypertension   . Inguinal hernia   . Multiple myeloma [facility]) Sept 2010  . Multiple myeloma in relapse ([place]) 04/05/2016  . Radiation 09/26/15-10/20/15   lower thoracic spine, upper lumbar spine 30 gray  . Stroke [facility])     Social History:  Social History   Socioeconomic History  . Marital status:  Married    Spouse name: Romie Minus  . Number of children: Not on file  . Years of education: Not on file  . Highest education level: Not on file  Occupational History  . Occupation: retired  Tobacco Use  . Smoking status: Never Smoker  . Smokeless tobacco: Never Used  . Tobacco comment: never used tobacco  Substance and Sexual Activity  . Alcohol use: Yes    Alcohol/week: 4.0 standard drinks    Types: 4 Glasses of wine per week  . Drug use: No  . Sexual activity: Not on file  Other Topics Concern  . Not on file  Social History Narrative   Christian Karstens is retired and lives at [employer] at [address] retirement apartment with his wife, Romie Minus. They have assist from their son, [name] and grandson, [name]. He is independent/assist with his care needs His son and/or grandson assists with transportation to medical appointments   Social Determinants of Health   Financial Resource Strain: Low Risk   . Difficulty of Paying Living Expenses: Not hard at all  Food Insecurity: No Food Insecurity  . Worried About Charity fundraiser in the Last Year: Never true  . Ran Out of Food in the Last Year: Never true  Transportation Needs: No Transportation Needs  . Lack of Transportation (Medical): No  . Lack of Transportation (Non-Medical): No  Physical Activity:   . Days of Exercise per Week: Not on file  . Minutes of Exercise per Session: Not on file  Stress:   . Feeling of Stress : Not on file  Social Connections: Unknown  . Frequency of Communication with Friends and Family: More than three times a week  . Frequency of Social Gatherings with Friends and Family: More than three times a week  . Attends Religious Services: Not on file  . Active Member of Clubs or Organizations: Not on file  . Attends Archivist Meetings: Not on file  . Marital Status: Married  Human resources officer Violence:   . Fear of Current or Ex-Partner: Not on file  . Emotionally Abused: Not on file  . Physically Abused:  Not on file  . Sexually Abused: Not on file    Medications:   Current Outpatient Medications on File Prior to Visit  Medication Sig Dispense Refill  . amLODipine-benazepril (LOTREL) 5-10 MG capsule TAKE ONE CAPSULE EACH DAY (Patient taking differently: Take 1 capsule by mouth daily. ) 30 capsule 2  . aspirin EC 81 MG EC tablet Take 1 tablet (81 mg total) by mouth daily.    . bendamustine in sodium chloride 0.9 % 50 mL Inject into the vein once.    Marland Kitchen dextromethorphan-guaiFENesin (MUCINEX DM) 30-600 MG 12hr tablet Take 1 tablet by mouth 2 (two) times daily as needed for cough. (Patient not taking: Reported on 06/22/2019)    . dronabinol (MARINOL) 5 MG capsule TAKE ONE CAPSULE TWICE DAILY BEFORE MEALS 60 capsule 0  . finasteride (PROSCAR) 5 MG tablet TAKE ONE TABLET EACH DAY (Patient taking differently:  Take 5 mg by mouth daily. ) 30 tablet 6  . LORazepam (ATIVAN) 0.5 MG tablet Take 1 tablet (0.5 mg total) by mouth every 6 (six) hours as needed (Nausea or vomiting). (Patient not taking: Reported on 08/26/2019) 30 tablet 0  . Multiple Vitamin (MULTIVITAMIN WITH MINERALS) TABS tablet Take 1 tablet by mouth daily. 30 tablet 0  . ondansetron (ZOFRAN) 8 MG tablet Take 1 tablet (8 mg total) by mouth 2 (two) times daily as needed for refractory nausea / vomiting. Start on day 2 after Bendamustine chemotherapy. (Patient not taking: Reported on 08/04/2019) 30 tablet 1  . Palonosetron HCl (ALOXI IV) Inject into the vein.    Marland Kitchen penicillin v potassium (VEETID) 500 MG tablet Take 1 tablet (500 mg total) by mouth 4 (four) times daily. #120 (Patient taking differently: Take 500 mg by mouth 4 (four) times daily. ) 120 tablet 11  . polyvinyl alcohol (LIQUIFILM TEARS) 1.4 % ophthalmic solution Place 1 drop into both eyes as needed for dry eyes. 15 mL 0  . predniSONE (DELTASONE) 20 MG tablet Take 1 tablet (20 mg total) by mouth daily with breakfast. 30 tablet 2  . Probiotic Product (PROBIOTIC PO) Take 1 capsule by mouth  daily.    . prochlorperazine (COMPAZINE) 10 MG tablet Take 1 tablet (10 mg total) by mouth every 6 (six) hours as needed (nausea and vomiting). (Patient not taking: Reported on 08/04/2019) 30 tablet 2  . pyridOXINE (VITAMIN B-6) 100 MG tablet Take 100 mg by mouth daily.     . traMADol (ULTRAM) 50 MG tablet Take 1 tablet (50 mg total) by mouth every 8 (eight) hours as needed for moderate pain. (Patient not taking: Reported on 08/26/2019)    . vitamin B-12 (CYANOCOBALAMIN) 500 MCG tablet Take 500 mcg by mouth daily.    . Vitamin D, Ergocalciferol, (DRISDOL) 50000 UNITS CAPS capsule Take 50,000 Units by mouth every Sunday.      No current facility-administered medications on file prior to visit.    Allergies:   Allergies  Allergen Reactions  . Sulfa Antibiotics Itching  . Sulfasalazine Itching    Physical Exam  Today's Vitals   09/08/19 1344  BP: (!) 144/62  Pulse: 93  Temp: 97.9 F (36.6 C)  SpO2: 99%  Weight: 144 lb 3.2 oz (65.4 kg)  Height: 5' 10"  (1.778 m)   Body mass index is 20.69 kg/m.   General: Frail pleasant elderly Caucasian male seated, in no evident distress Head: head normocephalic and atraumatic.   Neck: supple with no carotid or supraclavicular bruits Cardiovascular: regular rate and rhythm, no murmurs Musculoskeletal: no deformity Skin:  no ras but scattered petichiae Vascular:  Normal pulses all extremities  Neurologic Exam Mental Status: Awake and fully alert.  Normal speech and language.  Oriented to place and time. Recent and remote memory intact. Attention span, concentration and fund of knowledge appropriate. Mood and affect appropriate.  Cranial Nerves: Pupils equal, briskly reactive to light. Extraocular movements full without nystagmus. Visual fields full to confrontation. Hearing diminished in the right ear t. Facial sensation intact. Face, tongue, palate moves normally and symmetrically.  Motor: Normal bulk and tone. Normal strength in all tested  extremity muscles. Sensory.: intact to touch , pinprick , position and vibratory sensation.  Coordination: Rapid alternating movements normal in all extremities. Finger-to-nose and heel-to-shin performed accurately bilaterally. Gait and Station: Arises from chair without difficulty. Stance is normal. Gait demonstrates slight favoring of the right leg but normal stride length and balance .  Able to heel, toe and tandem walk with moderate difficulty.  Reflexes: 1+ and symmetric. Toes downgoing.     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  Loop recorder placed which has not shown atrial fibrillation thus far.  Vascular risk factors of age and borderline hyperlipidemia only.  Continues to do well from a stroke standpoint without residual, reoccurring or new stroke/TIA symptoms     PLAN: -Continue aspirin 81 mg daily for secondary stroke prevention  -Continue to follow with PCP for HTN monitoring and management -Continue to follow with oncology for management of multiple myeloma -Continue to stay active and maintain a healthy diet -Loop recorder will continue to be monitored -maintain strict control of hypertension with blood pressure goal below 130/90, diabetes with hemoglobin A1c goal below 6.5% and lipids with LDL cholesterol goal below 70 mg/dL. I also advised the patient to eat a healthy diet with plenty of whole grains, cereals, fruits and vegetables, exercise regularly and maintain ideal body weight.   He will follow up with Dr. Marin Olp for his multiple myeloma treatment.    Greater than 50% time during this 20 minute visit was spent on counseling and coordination of care about his cryptogenic stroke discussion about evaluation, discussion regarding overall great recovery and ongoing exercise and adequate nutrition, prevention and treatment and  answering questions to patient satisfaction   Frann Rider, AGNP-BC  [facility] [address] [place], [place] [address]  Phone [phone] Fax [phone] Note: This document was prepared with digital dictation and possible smart phrase technology. Any transcriptional errors that result from this process are unintentional.

## 2019-09-08 NOTE — Progress Notes (Signed)
I agree with the above plan 

## 2019-09-10 ENCOUNTER — Telehealth: Payer: Self-pay | Admitting: *Deleted

## 2019-09-10 ENCOUNTER — Other Ambulatory Visit: Payer: Self-pay | Admitting: *Deleted

## 2019-09-10 DIAGNOSIS — D508 Other iron deficiency anemias: Secondary | ICD-10-CM

## 2019-09-10 DIAGNOSIS — C9002 Multiple myeloma in relapse: Secondary | ICD-10-CM

## 2019-09-10 DIAGNOSIS — D649 Anemia, unspecified: Secondary | ICD-10-CM

## 2019-09-10 NOTE — Telephone Encounter (Signed)
Call received from patient stating that he has felt weak with low energy and would like to know if he needs a blood transfusion.  Pt scheduled to come in on Monday, 09/13/19 for labs per order of Dr. Marin Olp.  Pt appreciative of assistance.

## 2019-09-13 ENCOUNTER — Inpatient Hospital Stay: Payer: Medicare HMO | Attending: Hematology & Oncology

## 2019-09-13 ENCOUNTER — Encounter: Payer: Self-pay | Admitting: Hematology & Oncology

## 2019-09-13 ENCOUNTER — Inpatient Hospital Stay (HOSPITAL_BASED_OUTPATIENT_CLINIC_OR_DEPARTMENT_OTHER): Payer: Medicare HMO | Admitting: Hematology & Oncology

## 2019-09-13 ENCOUNTER — Other Ambulatory Visit: Payer: Self-pay

## 2019-09-13 ENCOUNTER — Telehealth: Payer: Self-pay | Admitting: *Deleted

## 2019-09-13 ENCOUNTER — Other Ambulatory Visit: Payer: Self-pay | Admitting: *Deleted

## 2019-09-13 VITALS — BP 152/67 | HR 86 | Temp 97.2°F | Resp 19

## 2019-09-13 DIAGNOSIS — Z7189 Other specified counseling: Secondary | ICD-10-CM | POA: Diagnosis not present

## 2019-09-13 DIAGNOSIS — R531 Weakness: Secondary | ICD-10-CM | POA: Insufficient documentation

## 2019-09-13 DIAGNOSIS — D649 Anemia, unspecified: Secondary | ICD-10-CM

## 2019-09-13 DIAGNOSIS — K922 Gastrointestinal hemorrhage, unspecified: Secondary | ICD-10-CM | POA: Diagnosis not present

## 2019-09-13 DIAGNOSIS — M791 Myalgia, unspecified site: Secondary | ICD-10-CM | POA: Insufficient documentation

## 2019-09-13 DIAGNOSIS — Z7952 Long term (current) use of systemic steroids: Secondary | ICD-10-CM | POA: Insufficient documentation

## 2019-09-13 DIAGNOSIS — T451X5A Adverse effect of antineoplastic and immunosuppressive drugs, initial encounter: Secondary | ICD-10-CM | POA: Insufficient documentation

## 2019-09-13 DIAGNOSIS — C9001 Multiple myeloma in remission: Secondary | ICD-10-CM | POA: Diagnosis not present

## 2019-09-13 DIAGNOSIS — R634 Abnormal weight loss: Secondary | ICD-10-CM | POA: Diagnosis not present

## 2019-09-13 DIAGNOSIS — Z7982 Long term (current) use of aspirin: Secondary | ICD-10-CM | POA: Insufficient documentation

## 2019-09-13 DIAGNOSIS — D6481 Anemia due to antineoplastic chemotherapy: Secondary | ICD-10-CM | POA: Insufficient documentation

## 2019-09-13 DIAGNOSIS — C9002 Multiple myeloma in relapse: Secondary | ICD-10-CM

## 2019-09-13 DIAGNOSIS — N289 Disorder of kidney and ureter, unspecified: Secondary | ICD-10-CM | POA: Insufficient documentation

## 2019-09-13 DIAGNOSIS — Z882 Allergy status to sulfonamides status: Secondary | ICD-10-CM | POA: Diagnosis not present

## 2019-09-13 DIAGNOSIS — Z79899 Other long term (current) drug therapy: Secondary | ICD-10-CM | POA: Diagnosis not present

## 2019-09-13 DIAGNOSIS — Z5111 Encounter for antineoplastic chemotherapy: Secondary | ICD-10-CM | POA: Diagnosis present

## 2019-09-13 DIAGNOSIS — D508 Other iron deficiency anemias: Secondary | ICD-10-CM

## 2019-09-13 LAB — CBC WITH DIFFERENTIAL (CANCER CENTER ONLY)
Abs Immature Granulocytes: 0.07 10*3/uL (ref 0.00–0.07)
Basophils Absolute: 0 10*3/uL (ref 0.0–0.1)
Basophils Relative: 1 %
Eosinophils Absolute: 0 10*3/uL (ref 0.0–0.5)
Eosinophils Relative: 2 %
HCT: 16.3 % — ABNORMAL LOW (ref 39.0–52.0)
Hemoglobin: 5.3 g/dL — CL (ref 13.0–17.0)
Immature Granulocytes: 4 %
Lymphocytes Relative: 2 %
Lymphs Abs: 0 10*3/uL — ABNORMAL LOW (ref 0.7–4.0)
MCH: 33.8 pg (ref 26.0–34.0)
MCHC: 32.5 g/dL (ref 30.0–36.0)
MCV: 103.8 fL — ABNORMAL HIGH (ref 80.0–100.0)
Monocytes Absolute: 0.3 10*3/uL (ref 0.1–1.0)
Monocytes Relative: 17 %
Neutro Abs: 1.4 10*3/uL — ABNORMAL LOW (ref 1.7–7.7)
Neutrophils Relative %: 74 %
Platelet Count: 155 10*3/uL (ref 150–400)
RBC: 1.57 MIL/uL — ABNORMAL LOW (ref 4.22–5.81)
RDW: 20.8 % — ABNORMAL HIGH (ref 11.5–15.5)
WBC Count: 1.9 10*3/uL — ABNORMAL LOW (ref 4.0–10.5)
nRBC: 0 % (ref 0.0–0.2)

## 2019-09-13 LAB — CMP (CANCER CENTER ONLY)
ALT: 16 U/L (ref 0–44)
AST: 25 U/L (ref 15–41)
Albumin: 3.1 g/dL — ABNORMAL LOW (ref 3.5–5.0)
Alkaline Phosphatase: 46 U/L (ref 38–126)
Anion gap: 8 (ref 5–15)
BUN: 46 mg/dL — ABNORMAL HIGH (ref 8–23)
CO2: 21 mmol/L — ABNORMAL LOW (ref 22–32)
Calcium: 8.3 mg/dL — ABNORMAL LOW (ref 8.9–10.3)
Chloride: 111 mmol/L (ref 98–111)
Creatinine: 1.86 mg/dL — ABNORMAL HIGH (ref 0.61–1.24)
GFR, Est AFR Am: 37 mL/min — ABNORMAL LOW (ref >60)
GFR, Estimated: 32 mL/min — ABNORMAL LOW (ref >60)
Glucose, Bld: 169 mg/dL — ABNORMAL HIGH (ref 70–99)
Potassium: 4.6 mmol/L (ref 3.5–5.1)
Sodium: 140 mmol/L (ref 135–145)
Total Bilirubin: 0.5 mg/dL (ref 0.3–1.2)
Total Protein: 5.4 g/dL — ABNORMAL LOW (ref 6.5–8.1)

## 2019-09-13 LAB — PREPARE RBC (CROSSMATCH)

## 2019-09-13 LAB — SAMPLE TO BLOOD BANK

## 2019-09-13 MED ORDER — PANTOPRAZOLE SODIUM 40 MG PO TBEC: 40.0000 mg | DELAYED_RELEASE_TABLET | Freq: Two times a day (BID) | ORAL | 3 refills | Status: DC

## 2019-09-13 NOTE — Progress Notes (Signed)
Hematology and Oncology Follow Up Visit  ALISTER SCHWASS [id number] Jun 05, 1930 86 y.o. 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 Palliative radiation therapy to right ilium Kyprolis/Cytoxanq 3 week dosing- s/p cycle 24 (Cytoxan restarted on cycle 13) - DC'd due to progression Radiation therapy to right femur, 10 fraction --completed 3000 rad on 11/06/2018 Elotuzomab - started 10/29/2018 -- s/p cycle #1 -- d/c on 12/24/2018 Pomalidomide 2 mg po q day (21 on/7 off) - started 10/29/2018 -- d/c 12/31/2018 Daratumumab - start cycle # 1 on 12/31/2018 -- d/c on -02/04/2019  Current Therapy:   Bendamustine/Decadron -- started on 02/10/2019, s/p cycle #7 Zometa IV q 4 weeks - on hold Aranesp 300 g subcutaneous as needed for hemoglobin less than 10 IVIG 40g IV q month -- start on 07/2019   Interim History:  Mr. Slemmer is here today for an early visit.  Apparently, he had called on Friday said that he was weak.  He came in today for lab work.  Shockingly, his hemoglobin was 5.3.  His white cell count is 1.9.  His platelet count was fine at 155,000.  He says that his stool has been black the past several days.  I am sure that he is having some GI bleeding.  His hemoglobin is never been this low.  He is responding to the bendamustine.  His M spike is now down to 0.5 g/dL.  His lambda light chain is now 8.5 mg/dL.  His IgG level is down to about 960 mg/dL.  He is on aspirin.  He is on prednisone.  Is not on any antacid.  I went ahead and start him on Protonix at 40 mg p.o. twice daily.  I did speak with one of our gastroenterologist.  They will see about getting him in and set him up with endoscopy.  I did check  his stool today.  His stool was brown but heme positive.  Otherwise he seems to be feeling okay.  He has had no fever.  He has had no cough.  He has had no nausea or vomiting.  Overall, his performance status is probably ECOG 2.    Medications:  Allergies as of 09/13/2019      Reactions   Sulfa Antibiotics Itching   Sulfasalazine Itching      Medication List       Accurate as of September 13, 2019  4:23 PM. If you have any questions, ask your nurse or doctor.        ALOXI IV Inject into the vein.   amLODipine-benazepril 5-10 MG capsule Commonly known as: LOTREL TAKE ONE CAPSULE EACH DAY What changed: See the new instructions.   aspirin 81 MG EC tablet Take 1 tablet (81 mg total) by mouth daily.   bendamustine in sodium chloride 0.9 % 50 mL Inject into the vein once.   dextromethorphan-guaiFENesin 30-600 MG 12hr tablet Commonly known as: MUCINEX DM Take 1 tablet by mouth 2 (two) times daily as needed for cough.   dronabinol 5 MG capsule Commonly known as: MARINOL TAKE ONE CAPSULE TWICE DAILY BEFORE MEALS   finasteride 5 MG tablet Commonly known as: PROSCAR TAKE ONE TABLET EACH DAY What changed: See the new instructions.   LORazepam 0.5 MG tablet Commonly  known as: Ativan Take 1 tablet (0.5 mg total) by mouth every 6 (six) hours as needed (Nausea or vomiting).   multivitamin with minerals Tabs tablet Take 1 tablet by mouth daily.   ondansetron 8 MG tablet Commonly known as: Zofran Take 1 tablet (8 mg total) by mouth 2 (two) times daily as needed for refractory nausea / vomiting. Start on day 2 after Bendamustine chemotherapy.   pantoprazole 40 MG tablet Commonly known as: Protonix Take 1 tablet (40 mg total) by mouth 2 (two) times daily. Started by: [name], MD   penicillin v potassium 500 MG tablet Commonly known as: VEETID Take 1 tablet (500 mg total) by mouth 4 (four) times daily. #120 What changed: additional instructions   polyvinyl alcohol  1.4 % ophthalmic solution Commonly known as: LIQUIFILM TEARS Place 1 drop into both eyes as needed for dry eyes.   predniSONE 20 MG tablet Commonly known as: Deltasone Take 1 tablet (20 mg total) by mouth daily with breakfast.   PROBIOTIC PO Take 1 capsule by mouth daily.   prochlorperazine 10 MG tablet Commonly known as: COMPAZINE Take 1 tablet (10 mg total) by mouth every 6 (six) hours as needed (nausea and vomiting).   pyridOXINE 100 MG tablet Commonly known as: VITAMIN B-6 Take 100 mg by mouth daily.   QC Multi-Vite 50 & Over Tabs Take 1 tablet by mouth daily.   traMADol 50 MG tablet Commonly known as: ULTRAM Take 1 tablet (50 mg total) by mouth every 8 (eight) hours as needed for moderate pain.   vitamin B-12 500 MCG tablet Commonly known as: CYANOCOBALAMIN Take 500 mcg by mouth daily.   Vitamin D (Ergocalciferol) 1.25 MG (50000 UNIT) Caps capsule Commonly known as: DRISDOL Take 50,000 Units by mouth every Sunday.       Allergies:  Allergies  Allergen Reactions  . Sulfa Antibiotics Itching  . Sulfasalazine Itching    Past Medical History, Surgical history, Social history, and Family History were reviewed and updated.  Review of Systems: Review of Systems  Constitutional: Positive for weight loss.  HENT: Negative.   Eyes: Negative.   Cardiovascular: Negative.   Gastrointestinal: Negative.   Genitourinary: Negative.   Musculoskeletal: Positive for myalgias.  Skin: Negative.   Neurological: Positive for focal weakness.  Endo/Heme/Allergies: Negative.   Psychiatric/Behavioral: Negative.      Physical Exam:  temporal temperature is 97.2 F (36.2 C) (abnormal). His blood pressure is 152/67 (abnormal) and his pulse is 86. His respiration is 19 and oxygen saturation is 100%.   Wt Readings from Last 3 Encounters:  09/08/19 144 lb 3.2 oz (65.4 kg)  08/31/19 143 lb 6.4 oz (65 kg)  08/26/19 144 lb (65.3 kg)    Physical Exam Vitals reviewed.  HENT:      Head: Normocephalic and atraumatic.  Eyes:     Pupils: Pupils are equal, round, and reactive to light.  Cardiovascular:     Rate and Rhythm: Normal rate and regular rhythm.     Heart sounds: Normal heart sounds.  Pulmonary:     Effort: Pulmonary effort is normal.     Breath sounds: Normal breath sounds.  Abdominal:     General: Bowel sounds are normal.     Palpations: Abdomen is soft.  Musculoskeletal:        General: No tenderness or deformity. Normal range of motion.     Cervical back: Normal range of motion.  Lymphadenopathy:     Cervical: No cervical adenopathy.  Skin:  General: Skin is warm and dry.     Findings: No erythema or rash.  Neurological:     Mental Status: He is alert and oriented to person, place, and time.  Psychiatric:        Behavior: Behavior normal.        Thought Content: Thought content normal.        Judgment: Judgment normal.      Lab Results  Component Value Date   WBC 1.9 (L) 09/13/2019   HGB 5.3 (LL) 09/13/2019   HCT 16.3 (L) 09/13/2019   MCV 103.8 (H) 09/13/2019   PLT 155 09/13/2019   Lab Results  Component Value Date   FERRITIN 547 (H) 08/31/2019   IRON 107 08/31/2019   TIBC 220 08/31/2019   UIBC 113 (L) 08/31/2019   IRONPCTSAT 49 08/31/2019   Lab Results  Component Value Date   RETICCTPCT 1.3 12/10/2018   RBC 1.57 (L) 09/13/2019   Lab Results  Component Value Date   KPAFRELGTCHN 1.4 (L) 08/31/2019   LAMBDASER 85.0 (H) 08/31/2019   KAPLAMBRATIO 0.02 (L) 08/31/2019   Lab Results  Component Value Date   IGGSERUM 966 08/31/2019   IGA 5 (L) 08/31/2019   IGMSERUM 12 (L) 08/31/2019   Lab Results  Component Value Date   TOTALPROTELP 5.4 (L) 08/31/2019   ALBUMINELP 3.0 08/31/2019   A1GS 0.3 08/31/2019   A2GS 0.7 08/31/2019   BETS 0.7 08/31/2019   BETA2SER 0.3 07/28/2015   GAMS 0.8 08/31/2019   MSPIKE 0.5 (H) 08/31/2019   SPEI Comment 08/31/2019     Chemistry      Component Value Date/Time   NA 140 09/13/2019 1453    NA 146 (H) 08/07/2017 1153   NA 141 01/09/2017 1004   K 4.6 09/13/2019 1453   K 4.6 08/07/2017 1153   K 4.4 01/09/2017 1004   CL 111 09/13/2019 1453   CL 108 08/07/2017 1153   CO2 21 (L) 09/13/2019 1453   CO2 26 08/07/2017 1153   CO2 22 01/09/2017 1004   BUN 46 (H) 09/13/2019 1453   BUN 24 (H) 08/07/2017 1153   BUN 23.9 01/09/2017 1004   CREATININE 1.86 (H) 09/13/2019 1453   CREATININE 1.56 (H) 05/26/2018 1508   CREATININE 1.5 (H) 01/09/2017 1004      Component Value Date/Time   CALCIUM 8.3 (L) 09/13/2019 1453   CALCIUM 8.8 08/07/2017 1153   CALCIUM 8.8 01/09/2017 1004   ALKPHOS 46 09/13/2019 1453   ALKPHOS 97 (H) 08/07/2017 1153   ALKPHOS 80 01/09/2017 1004   AST 25 09/13/2019 1453   AST 18 01/09/2017 1004   ALT 16 09/13/2019 1453   ALT 20 08/07/2017 1153   ALT 12 01/09/2017 1004   BILITOT 0.5 09/13/2019 1453   BILITOT 0.92 01/09/2017 1004       Impression and Plan: Mr. Thornes is a very pleasant 86 yo caucasian gentleman with relapsed IgG lambda myeloma.    He continues to have a nice response to treatment. His counts are looking much better.   Again, he is having GI bleeding.  I suspect this is probably from the upper tract.  He will get 2 units of blood tomorrow.  This will definitely help him out.  We will try to get him into gastroenterology tomorrow so that they can do their evaluation and see about set him up with endoscopy.  I sent in the Protonix prescription for him to start today.  This was pretty complicated.  We spent about  40 minutes dealing with this.  We had to set up the blood transfusion.  I had to talk with gastroenterology.  I would like to try to avoid him being admitted.     [name], MD 2/1/20214:23 PM

## 2019-09-13 NOTE — Telephone Encounter (Signed)
Critical Hgb 5.3 reported by Richardson Landry in lab.  Dr Marin Olp wants to see patient as an office visit today.  Dr. Marin Olp notified.

## 2019-09-14 ENCOUNTER — Ambulatory Visit: Payer: Medicare HMO | Admitting: Gastroenterology

## 2019-09-14 ENCOUNTER — Other Ambulatory Visit: Payer: Self-pay | Admitting: Family

## 2019-09-14 ENCOUNTER — Encounter: Payer: Self-pay | Admitting: Gastroenterology

## 2019-09-14 ENCOUNTER — Inpatient Hospital Stay: Payer: Medicare HMO

## 2019-09-14 ENCOUNTER — Telehealth: Payer: Self-pay | Admitting: Hematology & Oncology

## 2019-09-14 VITALS — BP 148/82 | HR 78 | Temp 98.2°F | Ht 70.0 in | Wt 148.4 lb

## 2019-09-14 DIAGNOSIS — D649 Anemia, unspecified: Secondary | ICD-10-CM | POA: Diagnosis not present

## 2019-09-14 DIAGNOSIS — C9002 Multiple myeloma in relapse: Secondary | ICD-10-CM

## 2019-09-14 DIAGNOSIS — C9000 Multiple myeloma not having achieved remission: Secondary | ICD-10-CM

## 2019-09-14 DIAGNOSIS — Z01818 Encounter for other preprocedural examination: Secondary | ICD-10-CM

## 2019-09-14 DIAGNOSIS — I63412 Cerebral infarction due to embolism of left middle cerebral artery: Secondary | ICD-10-CM | POA: Diagnosis not present

## 2019-09-14 DIAGNOSIS — R54 Age-related physical debility: Secondary | ICD-10-CM

## 2019-09-14 DIAGNOSIS — K921 Melena: Secondary | ICD-10-CM

## 2019-09-14 DIAGNOSIS — C9001 Multiple myeloma in remission: Secondary | ICD-10-CM

## 2019-09-14 DIAGNOSIS — M272 Inflammatory conditions of jaws: Secondary | ICD-10-CM

## 2019-09-14 MED ORDER — DIPHENHYDRAMINE HCL 25 MG PO CAPS
25.0000 mg | ORAL_CAPSULE | Freq: Once | ORAL | Status: AC
  Administered 2019-09-14: 25 mg via ORAL

## 2019-09-14 MED ORDER — ACETAMINOPHEN 325 MG PO TABS
650.0000 mg | ORAL_TABLET | Freq: Once | ORAL | Status: AC
  Administered 2019-09-14: 650 mg via ORAL

## 2019-09-14 MED ORDER — SODIUM CHLORIDE 0.9 % IV SOLN
Freq: Once | INTRAVENOUS | Status: AC
  Filled 2019-09-14: qty 250

## 2019-09-14 MED ORDER — FUROSEMIDE 10 MG/ML IJ SOLN: 20.0000 mg | Freq: Once | INTRAMUSCULAR | Status: DC

## 2019-09-14 MED ORDER — DIPHENHYDRAMINE HCL 25 MG PO CAPS
ORAL_CAPSULE | ORAL | Status: AC
  Filled 2019-09-14: qty 1

## 2019-09-14 MED ORDER — ACETAMINOPHEN 325 MG PO TABS
ORAL_TABLET | ORAL | Status: AC
  Filled 2019-09-14: qty 2

## 2019-09-14 MED ORDER — SODIUM CHLORIDE 0.9% IV SOLUTION
250.0000 mL | Freq: Once | INTRAVENOUS | Status: AC
  Administered 2019-09-14: 250 mL via INTRAVENOUS
  Filled 2019-09-14: qty 250

## 2019-09-14 NOTE — Patient Instructions (Signed)
You have been scheduled for an endoscopy. Please follow written instructions given to you at your visit today. If you use inhalers (even only as needed), please bring them with you on the day of your procedure. Your physician has requested that you go to www.startemmi.com and enter the access code given to you at your visit today. This web site gives a general overview about your procedure. However, you should still follow specific instructions given to you by our office regarding your preparation for the procedure.  It was a pleasure to see you today!  Vito Cirigliano, D.O.  

## 2019-09-14 NOTE — Progress Notes (Signed)
Chief Complaint: Melena, anemia  Referring Provider:     Burney Gauze MD   HPI:    Christian Burns is a 86 y.o. male with a history of multiple myeloma, CKD 3, jaw osteomyelitis, hypertension, history of CVA, anemia of renal insufficiency/chemotherapy, referred to the Gastroenterology Clinic for evaluation of melena and anemia.  Was seen in the Oncology office yesterday for weakness and melena, with hemoglobin 5.3, WBC 1.9, PLT 155.  Has been having black stools for the last several days.  Heme positive yesterday.  Hgb fluctuates between 8.5-10.5 since 2017.  MCV had remained in high 80s to mid 90s, but was 103.8 yesterday.  BUN/creatinine a little above baseline at 46/1.86 (baseline ~33/1.4).  Otherwise, normal ferritin, iron panel earlier this month.  Has o/w been doing well from a myeloma treatment standpoint.  Had been feeling weak/fatigued for the last week or so, with melena over the same amount of time. Takes Marinol for reduced appetite. Weight down 4-5 lbs over last few weeks. Otherwise, no fever, chills, nausea, vomiting, abdominal pain. No prior similar sxs.   Does take ASA 81 mg and prednisone (neither are new).  Was prescribed Protonix 40 mg bid yesterday (hasnt started yet) by his Oncologist and PRBC transfusion earlier today.  Feels much improved following blood transfusion.  No prior EGD. Colonoscopy many years ago.    Past Medical History:  Diagnosis Date  . CKD (chronic kidney disease) 07/06/2018   CKD stage III  . Goals of care, counseling/discussion 02/04/2019  . History of radiation therapy 06/17/13-07/06/13   35 gray to upper lumbar spine  . 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 Hypertension   . Inguinal hernia   . Multiple myeloma [facility]) Sept 2010  . Multiple myeloma in relapse ([place]) 04/05/2016  . Radiation 09/26/15-10/20/15   lower thoracic spine, upper lumbar spine 30 gray  . Stroke [facility])      Past Surgical History:  Procedure  Laterality Date  . COLONOSCOPY    . HIP PINNING,CANNULATED Right 09/01/2018   Procedure: [facility] NAIL HIP PINNING;  Surgeon: Melrose Nakayama, MD;  Location: [place];  Service: Orthopedics;  Laterality: Right;  . LOOP RECORDER INSERTION N/A 04/21/2019   Procedure: LOOP RECORDER INSERTION;  Surgeon: Evans Lance, MD;  Location: [place] [facility];  Service: Cardiovascular;  Laterality: N/A;  . TEE WITHOUT CARDIOVERSION N/A 10/09/2018   Procedure: TRANSESOPHAGEAL ECHOCARDIOGRAM (TEE);  Surgeon: Acie Fredrickson Wonda Cheng, MD;  Location: [facility] ENDOSCOPY;  Service: Cardiovascular;  Laterality: N/A;  . TONSILLECTOMY     Family History  Problem Relation Age of Onset  . Hypertension Mother   . Hypertension Father   . Colon cancer Neg Hx   . Esophageal cancer Neg Hx   . Cancer Neg Hx    Social History   Tobacco Use  . Smoking status: Never Smoker  . Smokeless tobacco: Never Used  . Tobacco comment: never used tobacco  Substance Use Topics  . Alcohol use: Yes    Alcohol/week: 4.0 standard drinks    Types: 4 Glasses of wine per week  . Drug use: No   Current Outpatient Medications  Medication Sig Dispense Refill  . amLODipine-benazepril (LOTREL) 5-10 MG capsule TAKE ONE CAPSULE EACH DAY (Patient taking differently: Take 1 capsule by mouth daily. ) 30 capsule 2  . aspirin EC 81 MG EC tablet Take 1 tablet (81 mg total) by mouth daily.    Marland Kitchen  bendamustine in sodium chloride 0.9 % 50 mL Inject into the vein once.    Marland Kitchen dextromethorphan-guaiFENesin (MUCINEX DM) 30-600 MG 12hr tablet Take 1 tablet by mouth 2 (two) times daily as needed for cough.    . dronabinol (MARINOL) 5 MG capsule TAKE ONE CAPSULE TWICE DAILY BEFORE MEALS 60 capsule 0  . finasteride (PROSCAR) 5 MG tablet TAKE ONE TABLET EACH DAY (Patient taking differently: Take 5 mg by mouth daily. ) 30 tablet 6  . LORazepam (ATIVAN) 0.5 MG tablet Take 1 tablet (0.5 mg total) by mouth every 6 (six) hours as needed (Nausea or vomiting). 30 tablet 0  . Multiple  Vitamin (MULTIVITAMIN WITH MINERALS) TABS tablet Take 1 tablet by mouth daily. 30 tablet 0  . Multiple Vitamins-Minerals (QC MULTI-VITE 50 & OVER) TABS Take 1 tablet by mouth daily.    . ondansetron (ZOFRAN) 8 MG tablet Take 1 tablet (8 mg total) by mouth 2 (two) times daily as needed for refractory nausea / vomiting. Start on day 2 after Bendamustine chemotherapy. 30 tablet 1  . Palonosetron HCl (ALOXI IV) Inject into the vein.    . pantoprazole (PROTONIX) 40 MG tablet Take 1 tablet (40 mg total) by mouth 2 (two) times daily. 60 tablet 3  . penicillin v potassium (VEETID) 500 MG tablet Take 1 tablet (500 mg total) by mouth 4 (four) times daily. #120 (Patient taking differently: Take 500 mg by mouth 4 (four) times daily. ) 120 tablet 11  . polyvinyl alcohol (LIQUIFILM TEARS) 1.4 % ophthalmic solution Place 1 drop into both eyes as needed for dry eyes. 15 mL 0  . predniSONE (DELTASONE) 20 MG tablet Take 1 tablet (20 mg total) by mouth daily with breakfast. 30 tablet 2  . Probiotic Product (PROBIOTIC PO) Take 1 capsule by mouth daily.    . prochlorperazine (COMPAZINE) 10 MG tablet Take 1 tablet (10 mg total) by mouth every 6 (six) hours as needed (nausea and vomiting). 30 tablet 2  . pyridOXINE (VITAMIN B-6) 100 MG tablet Take 100 mg by mouth daily.     . traMADol (ULTRAM) 50 MG tablet Take 1 tablet (50 mg total) by mouth every 8 (eight) hours as needed for moderate pain.    . vitamin B-12 (CYANOCOBALAMIN) 500 MCG tablet Take 500 mcg by mouth daily.    . Vitamin D, Ergocalciferol, (DRISDOL) 50000 UNITS CAPS capsule Take 50,000 Units by mouth every Sunday.      No current facility-administered medications for this visit.   Facility-Administered Medications Ordered in Other Visits  Medication Dose Route Frequency Provider Last Rate Last Admin  . 0.9 %  sodium chloride infusion   Intravenous Once [name], MD      . furosemide (LASIX) injection 20 mg  20 mg Intravenous Once [name],  MD       Allergies  Allergen Reactions  . Sulfa Antibiotics Itching  . Sulfasalazine Itching     Review of Systems: All systems reviewed and negative except where noted in HPI.     Physical Exam:    Wt Readings from Last 3 Encounters:  09/14/19 148 lb 6 oz (67.3 kg)  09/08/19 144 lb 3.2 oz (65.4 kg)  08/31/19 143 lb 6.4 oz (65 kg)    BP (!) 148/82   Pulse 78   Temp 98.2 F (36.8 C)   Ht 5' 10"  (1.778 m)   Wt 148 lb 6 oz (67.3 kg)   BMI 21.29 kg/m  Constitutional:  Pleasant, in  no acute distress.  Sitting in wheelchair. Psychiatric: Normal mood and affect. Behavior is normal. EENT: Pupils normal.  Conjunctivae are normal. No scleral icterus. Neck supple. No cervical LAD. Cardiovascular: Normal rate, regular rhythm. No edema Pulmonary/chest: Effort normal and breath sounds normal. No wheezing, rales or rhonchi. Abdominal: Soft, nondistended, nontender. Bowel sounds active throughout. There are no masses palpable. No hepatomegaly. Neurological: Alert and oriented to person place and time. Skin: Skin is warm and dry. No rashes noted.   ASSESSMENT AND PLAN;   1) Melena 2) Anemia  Clinical presentation certainly seems consistent with upper GI bleed and will evaluate/treat as below:  -Expedited EGD for diagnostic and potentially therapeutic intent - Start Protonix 40 mg PO BID as previously ordered by Dr. Marin Olp -Repeat CBC in 1 week to ensure stability post transfusion.  Can check B12 and folate with repeat labs -Additional medical management pending endoscopic findings -No lower GI symptoms to warrant colonoscopy at this juncture -Risk factors certainly include ASA and prednisone use, but suspect these need to be continued and will instead need continued gastric prophylaxis with ongoing PPI therapy long-term  3) History of multiple myeloma 4) History of jaw osteomyelitis 5) Reduced appetite -Continued medical management per Dr. Marin Olp in the Oncology service  -Continue Marinol as prescribed with frequent weight checks -Continue p.o. intake as tolerated -Can certainly evaluate for gastritis, PUD, GOO as contributory factors at the time of EGD as above  6) History of CVA 7) Chronic antiplatelet therapy -Okay to resume ASA 81 mg in the perioperative period  The indications, risks, and benefits of EGD were explained to the patient in detail. Risks include but are not limited to bleeding, perforation, adverse reaction to medications, and cardiopulmonary compromise.  He is at increased risk given age, comorbidities, chronic aspirin use.  Sequelae include but are not limited to the possibility of surgery, hositalization, and mortality. The patient verbalized understanding and wished to proceed. All questions answered, referred to scheduler. Further recommendations pending results of the exam.     Lavena Bullion, DO, FACG  09/14/2019, 2:25 PM   Leanna Battles, MD

## 2019-09-14 NOTE — Telephone Encounter (Signed)
No LOS 2/1

## 2019-09-15 ENCOUNTER — Ambulatory Visit (INDEPENDENT_AMBULATORY_CARE_PROVIDER_SITE_OTHER): Payer: Medicare HMO

## 2019-09-15 ENCOUNTER — Other Ambulatory Visit: Payer: Self-pay | Admitting: Gastroenterology

## 2019-09-15 DIAGNOSIS — Z1159 Encounter for screening for other viral diseases: Secondary | ICD-10-CM

## 2019-09-15 LAB — TYPE AND SCREEN
ABO/RH(D): O POS
Antibody Screen: NEGATIVE
Unit division: 0
Unit division: 0

## 2019-09-15 LAB — BPAM RBC
Blood Product Expiration Date: 202103072359
Blood Product Expiration Date: 202103082359
ISSUE DATE / TIME: 202102020806
ISSUE DATE / TIME: 202102020806
Unit Type and Rh: 9500
Unit Type and Rh: 9500

## 2019-09-16 ENCOUNTER — Other Ambulatory Visit: Payer: Self-pay | Admitting: Hematology & Oncology

## 2019-09-16 LAB — SARS CORONAVIRUS 2 (TAT 6-24 HRS): SARS Coronavirus 2: NEGATIVE

## 2019-09-17 ENCOUNTER — Encounter: Payer: Self-pay | Admitting: Gastroenterology

## 2019-09-17 ENCOUNTER — Other Ambulatory Visit: Payer: Self-pay

## 2019-09-17 ENCOUNTER — Ambulatory Visit (AMBULATORY_SURGERY_CENTER): Payer: Medicare HMO | Admitting: Gastroenterology

## 2019-09-17 VITALS — BP 118/69 | HR 84 | Temp 102.1°F | Resp 22 | Ht 70.0 in | Wt 148.0 lb

## 2019-09-17 DIAGNOSIS — K2900 Acute gastritis without bleeding: Secondary | ICD-10-CM | POA: Diagnosis not present

## 2019-09-17 DIAGNOSIS — K260 Acute duodenal ulcer with hemorrhage: Secondary | ICD-10-CM

## 2019-09-17 DIAGNOSIS — K2990 Gastroduodenitis, unspecified, without bleeding: Secondary | ICD-10-CM | POA: Diagnosis not present

## 2019-09-17 DIAGNOSIS — K449 Diaphragmatic hernia without obstruction or gangrene: Secondary | ICD-10-CM | POA: Diagnosis not present

## 2019-09-17 DIAGNOSIS — K2980 Duodenitis without bleeding: Secondary | ICD-10-CM | POA: Diagnosis not present

## 2019-09-17 DIAGNOSIS — K2981 Duodenitis with bleeding: Secondary | ICD-10-CM

## 2019-09-17 DIAGNOSIS — K269 Duodenal ulcer, unspecified as acute or chronic, without hemorrhage or perforation: Secondary | ICD-10-CM

## 2019-09-17 DIAGNOSIS — K921 Melena: Secondary | ICD-10-CM | POA: Diagnosis present

## 2019-09-17 DIAGNOSIS — B3781 Candidal esophagitis: Secondary | ICD-10-CM | POA: Diagnosis not present

## 2019-09-17 DIAGNOSIS — K2970 Gastritis, unspecified, without bleeding: Secondary | ICD-10-CM

## 2019-09-17 DIAGNOSIS — K3189 Other diseases of stomach and duodenum: Secondary | ICD-10-CM

## 2019-09-17 MED ORDER — SODIUM CHLORIDE 0.9 % IV SOLN: 500.0000 mL | Freq: Once | INTRAVENOUS | Status: DC

## 2019-09-17 MED ORDER — FLUCONAZOLE 200 MG PO TABS: 200.0000 mg | ORAL_TABLET | Freq: Every day | ORAL | 0 refills | Status: AC

## 2019-09-17 NOTE — Progress Notes (Signed)
PT taken to PACU. Monitors in place. VSS. Report given to RN. 

## 2019-09-17 NOTE — Progress Notes (Signed)
Temp by LC, Vitals by DT

## 2019-09-17 NOTE — Patient Instructions (Signed)
Start Protonix as previously ordered.  Diflucan as ordered by MD.   YOU HAD AN ENDOSCOPIC PROCEDURE TODAY AT [facility]:   Refer to the procedure report that was given to you for any specific questions about what was found during the examination.  If the procedure report does not answer your questions, please call your gastroenterologist to clarify.  If you requested that your care partner not be given the details of your procedure findings, then the procedure report has been included in a sealed envelope for you to review at your convenience later.  YOU SHOULD EXPECT: Some feelings of bloating in the abdomen. Passage of more gas than usual.  Walking can help get rid of the air that was put into your GI tract during the procedure and reduce the bloating. If you had a lower endoscopy (such as a colonoscopy or flexible sigmoidoscopy) you may notice spotting of blood in your stool or on the toilet paper. If you underwent a bowel prep for your procedure, you may not have a normal bowel movement for a few days.  Please Note:  You might notice some irritation and congestion in your nose or some drainage.  This is from the oxygen used during your procedure.  There is no need for concern and it should clear up in a day or so.  SYMPTOMS TO REPORT IMMEDIATELY:    Following upper endoscopy (EGD)  Vomiting of blood or coffee ground material  New chest pain or pain under the shoulder blades  Painful or persistently difficult swallowing  New shortness of breath  Fever of 100F or higher  Black, tarry-looking stools  For urgent or emergent issues, a gastroenterologist can be reached at any hour by calling [phone].   DIET:  We do recommend a small meal at first, but then you may proceed to your regular diet.  Drink plenty of fluids but you should avoid alcoholic beverages for 24 hours.  ACTIVITY:  You should plan to take it easy for the rest of today and you should NOT DRIVE or use  heavy machinery until tomorrow (because of the sedation medicines used during the test).    FOLLOW UP: Our staff will call the number listed on your records 48-72 hours following your procedure to check on you and address any questions or concerns that you may have regarding the information given to you following your procedure. If we do not reach you, we will leave a message.  We will attempt to reach you two times.  During this call, we will ask if you have developed any symptoms of COVID 19. If you develop any symptoms (ie: fever, flu-like symptoms, shortness of breath, cough etc.) before then, please call [phone].  If you test positive for Covid 19 in the 2 weeks post procedure, please call and report this information to [place].    If any biopsies were taken you will be contacted by phone or by letter within the next 1-3 weeks.  Please call us at [phone] if you have not heard about the biopsies in 3 weeks.    SIGNATURES/CONFIDENTIALITY: You and/or your care partner have signed paperwork which will be entered into your electronic medical record.  These signatures attest to the fact that that the information above on your After Visit Summary has been reviewed and is understood.  Full responsibility of the confidentiality of this discharge information lies with you and/or your care-partner.

## 2019-09-17 NOTE — Op Note (Signed)
[place] Patient Name: Christian Burns Procedure Date: 09/17/2019 4:30 PM MRN: [id number] Endoscopist: Gerrit Heck , MD Age: 86 Referring MD:  Date of Birth: 09-Mar-1930 Gender: Male Account #: [id number] Procedure:                Upper GI endoscopy Indications:              Acute post hemorrhagic anemia, Melena Medicines:                Monitored Anesthesia Care Procedure:                Pre-Anesthesia Assessment:                           - Prior to the procedure, a History and Physical                            was performed, and patient medications and                            allergies were reviewed. The patient's tolerance of                            previous anesthesia was also reviewed. The risks                            and benefits of the procedure and the sedation                            options and risks were discussed with the patient.                            All questions were answered, and informed consent                            was obtained. Prior Anticoagulants: The patient has                            taken no previous anticoagulant or antiplatelet                            agents. ASA Grade Assessment: III - A patient with                            severe systemic disease. After reviewing the risks                            and benefits, the patient was deemed in                            satisfactory condition to undergo the procedure.                           After obtaining informed consent, the endoscope was  passed under direct vision. Throughout the                            procedure, the patient's blood pressure, pulse, and                            oxygen saturations were monitored continuously. The                            Endoscope was introduced through the mouth, and                            advanced to the second part of duodenum. The upper                            GI endoscopy was  accomplished without difficulty.                            The patient tolerated the procedure well. Scope In: Scope Out: Findings:                 Localized, white plaques were found in the middle                            third of the esophagus. Biopsies were taken with a                            cold forceps for histology. Estimated blood loss                            was minimal.                           A non-obstructing and mild Schatzki ring was found                            in the lower third of the esophagus.                           A single medium-sized vascular lesion was found in                            the upper third of the esophagus, 24 cm from the                            incisors. This was not actively bleeding.                           A 2 cm hiatal hernia was present. There was mucosal                            friability within the hernia sac.  Scattered moderate inflammation characterized by                            erythema was found in the entire examined stomach.                            There were areas of mucosal friability in the                            gastric body and fundus. Biopsies were taken with a                            cold forceps for Helicobacter pylori testing.                            Estimated blood loss was minimal.                           Hematin (altered blood/coffee-ground-like material)                            was found in the entire examined stomach.                           Two superficial duodenal ulcers were found in the                            duodenal bulb. There was localized erythema and                            mild edema. The largest lesion was 3 mm in largest                            dimension and was actively oozing. For hemostasis,                            one hemostatic clip was successfully placed. There                            was no bleeding at the end  of the procedure.                            Biopsies were taken with a cold forceps for                            histology. Estimated blood loss was minimal.                           The second portion of the duodenum was normal. Complications:            No immediate complications. Estimated Blood Loss:     Estimated blood loss was minimal. Impression:               - Esophageal plaques were found, suspicious  for                            candidiasis. Biopsied.                           - Non-obstructing and mild Schatzki ring.                           - Bleb found in the esophagus.                           - 2 cm hiatal hernia.                           - Gastritis. Biopsied.                           - Hematin (altered blood/coffee-ground-like                            material) in the entire stomach.                           - Oozing duodenal ulcers. Clip was placed. Biopsied.                           - Normal second portion of the duodenum. Recommendation:           - Patient has a contact number available for                            emergencies. The signs and symptoms of potential                            delayed complications were discussed with the                            patient. Return to normal activities tomorrow.                            Written discharge instructions were provided to the                            patient.                           - Resume previous diet.                           - Continue present medications.                           - Await pathology results.                           - Use Protonix (pantoprazole) 40 mg PO BID for 8  weeks as previously prescribed.                           - Diflucan (fluconazole) 400 mg x1, then 200 mg PO                            daily for 3 weeks (#23, RF0).                           - Return to GI clinic at appointment to be                            scheduled in  the next 2-3 weeks.                           - Repeat CBC in 1 week. Gerrit Heck, MD 09/17/2019 5:09:45 PM

## 2019-09-17 NOTE — Progress Notes (Signed)
PT presented with a temp of 100 per wrist and 102.1 oral. Pt had treatment on [place] 2/1 for multiple myeloma and blood transfusion for anemia on Tuesday 2/2. Covid test on 2/3 was negative. Dr.Cirigliano made aware of the situation and decided that the benefit of doing the procedure will outweigh the risk with the temperature.

## 2019-09-17 NOTE — Progress Notes (Signed)
Called to room to assist during endoscopic procedure.  Patient ID and intended procedure confirmed with present staff. Received instructions for my participation in the procedure from the performing physician.  

## 2019-09-21 ENCOUNTER — Encounter: Payer: Self-pay | Admitting: *Deleted

## 2019-09-21 ENCOUNTER — Telehealth: Payer: Self-pay

## 2019-09-21 NOTE — Telephone Encounter (Signed)
  Follow up Call-  Call back number 09/17/2019  Post procedure Call Back phone  # [phone]  Permission to leave phone message Yes  Some recent data might be hidden     Patient questions:  Do you have a fever, pain , or abdominal swelling? No. Pain Score  0 *  Have you tolerated food without any problems? Yes.    Have you been able to return to your normal activities? Yes.    Do you have any questions about your discharge instructions: Diet   No. Medications  No. Follow up visit  No.  Do you have questions or concerns about your Care? No.  Actions: * If pain score is 4 or above: No action needed, pain <4.  1. Have you developed a fever since your procedure? no  2.   Have you had an respiratory symptoms (SOB or cough) since your procedure? no  3.   Have you tested positive for COVID 19 since your procedure no  4.   Have you had any family members/close contacts diagnosed with the COVID 19 since your procedure?  no   If yes to any of these questions please route to Joylene John, RN and Alphonsa Gin, Therapist, sports.

## 2019-09-21 NOTE — Telephone Encounter (Signed)
No answer, left message to call back later today, B.Giulian Goldring RN. 

## 2019-09-22 ENCOUNTER — Other Ambulatory Visit: Payer: Self-pay | Admitting: Hematology & Oncology

## 2019-09-22 DIAGNOSIS — I10 Essential (primary) hypertension: Secondary | ICD-10-CM

## 2019-09-27 ENCOUNTER — Other Ambulatory Visit: Payer: Self-pay | Admitting: *Deleted

## 2019-09-27 ENCOUNTER — Telehealth: Payer: Self-pay | Admitting: *Deleted

## 2019-09-27 DIAGNOSIS — C9002 Multiple myeloma in relapse: Secondary | ICD-10-CM

## 2019-09-27 MED ORDER — NITROFURANTOIN MONOHYD MACRO 100 MG PO CAPS: 100.0000 mg | ORAL_CAPSULE | Freq: Two times a day (BID) | ORAL | 0 refills | Status: DC

## 2019-09-27 MED ORDER — LEVOFLOXACIN 500 MG PO TABS: 500.0000 mg | ORAL_TABLET | Freq: Every day | ORAL | 0 refills | Status: DC

## 2019-09-27 NOTE — Telephone Encounter (Signed)
Call received from patient stating that he has a fever of 100.0, chills, nausea and frequent urination.  Pt states that he resides at [facility] and that he will be getting checked for Covid-19 within the next hour.  Dr. Marin Olp notified.  Patient notified that Dr. Marin Olp would like for him to take Tylenol 650 mg every 4hrs as needed for fever greater then 100.0 and to take Levaquin 500 mg daily which will be sent to [facility] per pt.'s request.  Instructed pt to call this office when Covid results are back.  Pt appreciative of assistance and has no questions at this time.

## 2019-09-28 ENCOUNTER — Inpatient Hospital Stay (HOSPITAL_COMMUNITY): Payer: Medicare HMO

## 2019-09-28 ENCOUNTER — Inpatient Hospital Stay (HOSPITAL_COMMUNITY)
Admission: EM | Admit: 2019-09-28 | Discharge: 2019-10-04 | DRG: 193 | Disposition: A | Discharge status: Skilled Nursing Facility | Payer: Medicare HMO | Attending: Family Medicine | Admitting: Family Medicine

## 2019-09-28 ENCOUNTER — Emergency Department (HOSPITAL_COMMUNITY): Payer: Medicare HMO

## 2019-09-28 ENCOUNTER — Encounter (HOSPITAL_COMMUNITY): Payer: Self-pay | Admitting: Internal Medicine

## 2019-09-28 ENCOUNTER — Other Ambulatory Visit: Payer: Self-pay

## 2019-09-28 DIAGNOSIS — C9002 Multiple myeloma in relapse: Secondary | ICD-10-CM | POA: Diagnosis present

## 2019-09-28 DIAGNOSIS — I10 Essential (primary) hypertension: Secondary | ICD-10-CM | POA: Diagnosis not present

## 2019-09-28 DIAGNOSIS — I129 Hypertensive chronic kidney disease with stage 1 through stage 4 chronic kidney disease, or unspecified chronic kidney disease: Secondary | ICD-10-CM | POA: Diagnosis present

## 2019-09-28 DIAGNOSIS — R6520 Severe sepsis without septic shock: Secondary | ICD-10-CM

## 2019-09-28 DIAGNOSIS — Z20822 Contact with and (suspected) exposure to covid-19: Secondary | ICD-10-CM | POA: Diagnosis present

## 2019-09-28 DIAGNOSIS — N183 Chronic kidney disease, stage 3 unspecified: Secondary | ICD-10-CM | POA: Diagnosis not present

## 2019-09-28 DIAGNOSIS — Z79891 Long term (current) use of opiate analgesic: Secondary | ICD-10-CM | POA: Diagnosis not present

## 2019-09-28 DIAGNOSIS — M272 Inflammatory conditions of jaws: Secondary | ICD-10-CM | POA: Diagnosis present

## 2019-09-28 DIAGNOSIS — Z7952 Long term (current) use of systemic steroids: Secondary | ICD-10-CM

## 2019-09-28 DIAGNOSIS — J188 Other pneumonia, unspecified organism: Secondary | ICD-10-CM

## 2019-09-28 DIAGNOSIS — Z79899 Other long term (current) drug therapy: Secondary | ICD-10-CM | POA: Diagnosis not present

## 2019-09-28 DIAGNOSIS — T451X5A Adverse effect of antineoplastic and immunosuppressive drugs, initial encounter: Secondary | ICD-10-CM | POA: Diagnosis not present

## 2019-09-28 DIAGNOSIS — J189 Pneumonia, unspecified organism: Principal | ICD-10-CM

## 2019-09-28 DIAGNOSIS — R7881 Bacteremia: Secondary | ICD-10-CM | POA: Diagnosis present

## 2019-09-28 DIAGNOSIS — N1832 Chronic kidney disease, stage 3b: Secondary | ICD-10-CM | POA: Diagnosis present

## 2019-09-28 DIAGNOSIS — A419 Sepsis, unspecified organism: Secondary | ICD-10-CM | POA: Diagnosis not present

## 2019-09-28 DIAGNOSIS — Z882 Allergy status to sulfonamides status: Secondary | ICD-10-CM | POA: Diagnosis not present

## 2019-09-28 DIAGNOSIS — Z7289 Other problems related to lifestyle: Secondary | ICD-10-CM

## 2019-09-28 DIAGNOSIS — E785 Hyperlipidemia, unspecified: Secondary | ICD-10-CM | POA: Diagnosis present

## 2019-09-28 DIAGNOSIS — D630 Anemia in neoplastic disease: Secondary | ICD-10-CM | POA: Diagnosis present

## 2019-09-28 DIAGNOSIS — Z888 Allergy status to other drugs, medicaments and biological substances status: Secondary | ICD-10-CM | POA: Diagnosis not present

## 2019-09-28 DIAGNOSIS — D701 Agranulocytosis secondary to cancer chemotherapy: Secondary | ICD-10-CM

## 2019-09-28 DIAGNOSIS — K209 Esophagitis, unspecified without bleeding: Secondary | ICD-10-CM | POA: Diagnosis present

## 2019-09-28 DIAGNOSIS — Z66 Do not resuscitate: Secondary | ICD-10-CM | POA: Diagnosis present

## 2019-09-28 DIAGNOSIS — Z7982 Long term (current) use of aspirin: Secondary | ICD-10-CM | POA: Diagnosis not present

## 2019-09-28 DIAGNOSIS — R1310 Dysphagia, unspecified: Secondary | ICD-10-CM | POA: Diagnosis not present

## 2019-09-28 DIAGNOSIS — Z8673 Personal history of transient ischemic attack (TIA), and cerebral infarction without residual deficits: Secondary | ICD-10-CM

## 2019-09-28 DIAGNOSIS — D649 Anemia, unspecified: Secondary | ICD-10-CM

## 2019-09-28 DIAGNOSIS — Z792 Long term (current) use of antibiotics: Secondary | ICD-10-CM

## 2019-09-28 DIAGNOSIS — N289 Disorder of kidney and ureter, unspecified: Secondary | ICD-10-CM | POA: Diagnosis present

## 2019-09-28 DIAGNOSIS — D696 Thrombocytopenia, unspecified: Secondary | ICD-10-CM | POA: Diagnosis not present

## 2019-09-28 DIAGNOSIS — D61810 Antineoplastic chemotherapy induced pancytopenia: Secondary | ICD-10-CM | POA: Diagnosis present

## 2019-09-28 DIAGNOSIS — D6481 Anemia due to antineoplastic chemotherapy: Secondary | ICD-10-CM

## 2019-09-28 DIAGNOSIS — J9601 Acute respiratory failure with hypoxia: Secondary | ICD-10-CM | POA: Diagnosis present

## 2019-09-28 DIAGNOSIS — R6510 Systemic inflammatory response syndrome (SIRS) of non-infectious origin without acute organ dysfunction: Secondary | ICD-10-CM | POA: Diagnosis present

## 2019-09-28 DIAGNOSIS — Z9221 Personal history of antineoplastic chemotherapy: Secondary | ICD-10-CM

## 2019-09-28 DIAGNOSIS — C9001 Multiple myeloma in remission: Secondary | ICD-10-CM

## 2019-09-28 DIAGNOSIS — Z923 Personal history of irradiation: Secondary | ICD-10-CM | POA: Diagnosis not present

## 2019-09-28 DIAGNOSIS — N1831 Chronic kidney disease, stage 3a: Secondary | ICD-10-CM

## 2019-09-28 DIAGNOSIS — A4189 Other specified sepsis: Secondary | ICD-10-CM | POA: Diagnosis not present

## 2019-09-28 DIAGNOSIS — Z8249 Family history of ischemic heart disease and other diseases of the circulatory system: Secondary | ICD-10-CM

## 2019-09-28 DIAGNOSIS — N179 Acute kidney failure, unspecified: Secondary | ICD-10-CM | POA: Diagnosis not present

## 2019-09-28 DIAGNOSIS — N400 Enlarged prostate without lower urinary tract symptoms: Secondary | ICD-10-CM | POA: Diagnosis not present

## 2019-09-28 DIAGNOSIS — Z96641 Presence of right artificial hip joint: Secondary | ICD-10-CM | POA: Diagnosis present

## 2019-09-28 DIAGNOSIS — R0902 Hypoxemia: Secondary | ICD-10-CM

## 2019-09-28 DIAGNOSIS — C9000 Multiple myeloma not having achieved remission: Secondary | ICD-10-CM | POA: Diagnosis not present

## 2019-09-28 LAB — CBC WITH DIFFERENTIAL/PLATELET
Abs Immature Granulocytes: 0.23 10*3/uL — ABNORMAL HIGH (ref 0.00–0.07)
Basophils Absolute: 0 10*3/uL (ref 0.0–0.1)
Basophils Relative: 0 %
Eosinophils Absolute: 0 10*3/uL (ref 0.0–0.5)
Eosinophils Relative: 1 %
HCT: 20.8 % — ABNORMAL LOW (ref 39.0–52.0)
Hemoglobin: 6.9 g/dL — CL (ref 13.0–17.0)
Immature Granulocytes: 9 %
Lymphocytes Relative: 1 %
Lymphs Abs: 0 10*3/uL — ABNORMAL LOW (ref 0.7–4.0)
MCH: 32.2 pg (ref 26.0–34.0)
MCHC: 33.2 g/dL (ref 30.0–36.0)
MCV: 97.2 fL (ref 80.0–100.0)
Monocytes Absolute: 0.2 10*3/uL (ref 0.1–1.0)
Monocytes Relative: 6 %
Neutro Abs: 2.3 10*3/uL (ref 1.7–7.7)
Neutrophils Relative %: 83 %
Platelets: 105 10*3/uL — ABNORMAL LOW (ref 150–400)
RBC: 2.14 MIL/uL — ABNORMAL LOW (ref 4.22–5.81)
RDW: 16.1 % — ABNORMAL HIGH (ref 11.5–15.5)
WBC: 2.7 10*3/uL — ABNORMAL LOW (ref 4.0–10.5)
nRBC: 0 % (ref 0.0–0.2)

## 2019-09-28 LAB — PROTIME-INR
INR: 1.3 — ABNORMAL HIGH (ref 0.8–1.2)
Prothrombin Time: 16 seconds — ABNORMAL HIGH (ref 11.4–15.2)

## 2019-09-28 LAB — BLOOD GAS, ARTERIAL
Acid-base deficit: 1.2 mmol/L (ref 0.0–2.0)
Bicarbonate: 21 mmol/L (ref 20.0–28.0)
Drawn by: 33147
FIO2: 80
O2 Saturation: 89.5 %
Patient temperature: 98.6
pCO2 arterial: 29 mmHg — ABNORMAL LOW (ref 32.0–48.0)
pH, Arterial: 7.473 — ABNORMAL HIGH (ref 7.350–7.450)
pO2, Arterial: 54.8 mmHg — ABNORMAL LOW (ref 83.0–108.0)

## 2019-09-28 LAB — SARS CORONAVIRUS 2 (TAT 6-24 HRS): SARS Coronavirus 2: NEGATIVE

## 2019-09-28 LAB — LACTIC ACID, PLASMA
Lactic Acid, Venous: 1.9 mmol/L (ref 0.5–1.9)
Lactic Acid, Venous: 0.8 mmol/L (ref 0.5–1.9)

## 2019-09-28 LAB — COMPREHENSIVE METABOLIC PANEL
ALT: 17 U/L (ref 0–44)
AST: 46 U/L — ABNORMAL HIGH (ref 15–41)
Albumin: 2.1 g/dL — ABNORMAL LOW (ref 3.5–5.0)
Alkaline Phosphatase: 43 U/L (ref 38–126)
Anion gap: 10 (ref 5–15)
BUN: 32 mg/dL — ABNORMAL HIGH (ref 8–23)
CO2: 22 mmol/L (ref 22–32)
Calcium: 9 mg/dL (ref 8.9–10.3)
Chloride: 105 mmol/L (ref 98–111)
Creatinine, Ser: 1.84 mg/dL — ABNORMAL HIGH (ref 0.61–1.24)
GFR calc Af Amer: 37 mL/min — ABNORMAL LOW (ref >60)
GFR calc non Af Amer: 32 mL/min — ABNORMAL LOW (ref >60)
Glucose, Bld: 117 mg/dL — ABNORMAL HIGH (ref 70–99)
Potassium: 3.6 mmol/L (ref 3.5–5.1)
Sodium: 137 mmol/L (ref 135–145)
Total Bilirubin: 1.2 mg/dL (ref 0.3–1.2)
Total Protein: 4.8 g/dL — ABNORMAL LOW (ref 6.5–8.1)

## 2019-09-28 LAB — HIV ANTIBODY (ROUTINE TESTING W REFLEX): HIV Screen 4th Generation wRfx: NONREACTIVE

## 2019-09-28 LAB — PREPARE RBC (CROSSMATCH)

## 2019-09-28 LAB — APTT: aPTT: 29 seconds (ref 24–36)

## 2019-09-28 MED ORDER — PANTOPRAZOLE SODIUM 40 MG PO TBEC
40.0000 mg | DELAYED_RELEASE_TABLET | Freq: Two times a day (BID) | ORAL | Status: DC
  Administered 2019-09-28 – 2019-10-04 (×13): 40 mg via ORAL
  Filled 2019-09-28 (×13): qty 1

## 2019-09-28 MED ORDER — FINASTERIDE 5 MG PO TABS
5.0000 mg | ORAL_TABLET | Freq: Every day | ORAL | Status: DC
  Administered 2019-09-28 – 2019-10-04 (×7): 5 mg via ORAL
  Filled 2019-09-28 (×8): qty 1

## 2019-09-28 MED ORDER — ACETAMINOPHEN 325 MG PO TABS
650.0000 mg | ORAL_TABLET | Freq: Four times a day (QID) | ORAL | Status: DC | PRN
  Administered 2019-09-28 – 2019-10-03 (×2): 650 mg via ORAL
  Filled 2019-09-28 (×3): qty 2

## 2019-09-28 MED ORDER — FUROSEMIDE 10 MG/ML IJ SOLN
40.0000 mg | Freq: Once | INTRAMUSCULAR | Status: AC
  Administered 2019-09-28: 40 mg via INTRAVENOUS
  Filled 2019-09-28: qty 4

## 2019-09-28 MED ORDER — SODIUM CHLORIDE 0.9 % IV SOLN
500.0000 mg | INTRAVENOUS | Status: DC
  Administered 2019-09-28: 16:00:00 500 mg via INTRAVENOUS
  Filled 2019-09-28: qty 500

## 2019-09-28 MED ORDER — AMLODIPINE BESYLATE 5 MG PO TABS
5.0000 mg | ORAL_TABLET | Freq: Every day | ORAL | Status: DC
  Administered 2019-09-28 – 2019-10-04 (×7): 5 mg via ORAL
  Filled 2019-09-28 (×7): qty 1

## 2019-09-28 MED ORDER — ACETAMINOPHEN 325 MG PO TABS
650.0000 mg | ORAL_TABLET | Freq: Once | ORAL | Status: AC
  Administered 2019-09-28: 650 mg via ORAL
  Filled 2019-09-28: qty 2

## 2019-09-28 MED ORDER — VANCOMYCIN HCL 1500 MG/300ML IV SOLN
1500.0000 mg | Freq: Once | INTRAVENOUS | Status: AC
  Administered 2019-09-28: 1500 mg via INTRAVENOUS
  Filled 2019-09-28: qty 300

## 2019-09-28 MED ORDER — ACETAMINOPHEN 650 MG RE SUPP: 650.0000 mg | Freq: Four times a day (QID) | RECTAL | Status: DC | PRN

## 2019-09-28 MED ORDER — SODIUM CHLORIDE 0.9 % IV SOLN: 500.0000 mg | INTRAVENOUS | Status: DC

## 2019-09-28 MED ORDER — PREDNISONE 20 MG PO TABS
20.0000 mg | ORAL_TABLET | Freq: Every day | ORAL | Status: DC
  Administered 2019-09-28 – 2019-10-04 (×7): 20 mg via ORAL
  Filled 2019-09-28 (×7): qty 1

## 2019-09-28 MED ORDER — SODIUM CHLORIDE 0.9 % IV SOLN
2.0000 g | Freq: Once | INTRAVENOUS | Status: AC
  Administered 2019-09-28: 2 g via INTRAVENOUS
  Filled 2019-09-28: qty 2

## 2019-09-28 MED ORDER — METRONIDAZOLE IN NACL 5-0.79 MG/ML-% IV SOLN
500.0000 mg | Freq: Once | INTRAVENOUS | Status: AC
  Administered 2019-09-28: 500 mg via INTRAVENOUS
  Filled 2019-09-28: qty 100

## 2019-09-28 MED ORDER — ENOXAPARIN SODIUM 30 MG/0.3ML ~~LOC~~ SOLN
30.0000 mg | SUBCUTANEOUS | Status: DC
  Administered 2019-09-28 – 2019-10-01 (×4): 30 mg via SUBCUTANEOUS
  Filled 2019-09-28 (×4): qty 0.3

## 2019-09-28 MED ORDER — VANCOMYCIN HCL 1250 MG/250ML IV SOLN: 1250.0000 mg | INTRAVENOUS | Status: DC

## 2019-09-28 MED ORDER — CHLORHEXIDINE GLUCONATE CLOTH 2 % EX PADS
6.0000 | MEDICATED_PAD | Freq: Every day | CUTANEOUS | Status: DC
  Administered 2019-09-28 – 2019-10-03 (×6): 6 via TOPICAL

## 2019-09-28 MED ORDER — SODIUM CHLORIDE 0.9% IV SOLUTION: Freq: Once | INTRAVENOUS | Status: DC

## 2019-09-28 MED ORDER — PENICILLIN V POTASSIUM 500 MG PO TABS
500.0000 mg | ORAL_TABLET | Freq: Four times a day (QID) | ORAL | Status: DC
  Administered 2019-09-28 – 2019-09-29 (×4): 500 mg via ORAL
  Filled 2019-09-28: qty 2
  Filled 2019-09-28: qty 1
  Filled 2019-09-28 (×3): qty 2

## 2019-09-28 MED ORDER — VITAMIN D (ERGOCALCIFEROL) 1.25 MG (50000 UNIT) PO CAPS
50000.0000 [IU] | ORAL_CAPSULE | ORAL | Status: DC
  Administered 2019-10-03: 50000 [IU] via ORAL
  Filled 2019-09-28: qty 1

## 2019-09-28 MED ORDER — SODIUM CHLORIDE 0.9 % IV SOLN
2.0000 g | INTRAVENOUS | Status: AC
  Administered 2019-09-28 – 2019-10-02 (×5): 2 g via INTRAVENOUS
  Filled 2019-09-28 (×2): qty 2
  Filled 2019-09-28 (×2): qty 20
  Filled 2019-09-28: qty 2

## 2019-09-28 MED ORDER — TRAMADOL HCL 50 MG PO TABS: 50.0000 mg | ORAL_TABLET | Freq: Three times a day (TID) | ORAL | Status: DC | PRN

## 2019-09-28 MED ORDER — LORAZEPAM 0.5 MG PO TABS: 0.5000 mg | ORAL_TABLET | Freq: Four times a day (QID) | ORAL | Status: DC | PRN

## 2019-09-28 MED ORDER — HYDRALAZINE HCL 20 MG/ML IJ SOLN: 10.0000 mg | INTRAMUSCULAR | Status: DC | PRN

## 2019-09-28 MED ORDER — DRONABINOL 5 MG PO CAPS
5.0000 mg | ORAL_CAPSULE | Freq: Every day | ORAL | Status: DC
  Administered 2019-09-28 – 2019-09-29 (×2): 5 mg via ORAL
  Filled 2019-09-28 (×2): qty 1

## 2019-09-28 MED ORDER — SODIUM CHLORIDE 0.9% IV SOLUTION: Freq: Once | INTRAVENOUS | Status: AC

## 2019-09-28 MED ORDER — ONDANSETRON HCL 4 MG/2ML IJ SOLN
4.0000 mg | Freq: Four times a day (QID) | INTRAMUSCULAR | Status: DC | PRN
  Administered 2019-10-04: 4 mg via INTRAVENOUS
  Filled 2019-09-28: qty 2

## 2019-09-28 MED ORDER — FLUCONAZOLE 100 MG PO TABS
200.0000 mg | ORAL_TABLET | Freq: Every day | ORAL | Status: DC
  Administered 2019-09-28 – 2019-10-04 (×7): 200 mg via ORAL
  Filled 2019-09-28 (×8): qty 2
  Filled 2019-09-28: qty 1

## 2019-09-28 MED ORDER — VANCOMYCIN HCL IN DEXTROSE 1-5 GM/200ML-% IV SOLN: 1000.0000 mg | Freq: Once | INTRAVENOUS | Status: DC

## 2019-09-28 MED ORDER — ONDANSETRON HCL 4 MG PO TABS
4.0000 mg | ORAL_TABLET | Freq: Four times a day (QID) | ORAL | Status: DC | PRN
  Administered 2019-10-02: 4 mg via ORAL
  Filled 2019-09-28: qty 1

## 2019-09-28 MED ORDER — VITAMIN B-6 100 MG PO TABS
100.0000 mg | ORAL_TABLET | Freq: Every day | ORAL | Status: DC
  Administered 2019-09-29 – 2019-10-04 (×6): 100 mg via ORAL
  Filled 2019-09-28 (×7): qty 1

## 2019-09-28 NOTE — ED Provider Notes (Signed)
[facility]   CSN: [id number] Arrival date & time: 09/28/19  0243   History Chief complaint: Fever   Christian [facility] is a 86 y.o. male.  The history is provided by the patient.  He has history of hypertension, hyperlipidemia, chronic kidney disease, multiple myeloma and comes in because of fever and cough.  Temperatures been as high as 101.  There have been associated chills and sweats.  He has had a cough which is nonproductive.  He is feeling mildly dyspneic.  There has been no nausea, vomiting, diarrhea.  He denies arthralgias or myalgias.  There have been no known sick contacts and, specifically, no exposure to COVID-19.  He has had the first COVID-19 vaccination and is scheduled to have his second dose later this week.  Past Medical History:  Diagnosis Date  . Anemia   . Blood transfusion without reported diagnosis   . CKD (chronic kidney disease) 07/06/2018   CKD stage III  . Goals of care, counseling/discussion 02/04/2019  . History of radiation therapy 06/17/13-07/06/13   35 gray to upper lumbar spine  . 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 Hypertension   . Inguinal hernia   . Multiple myeloma [facility]) Sept 2010  . Multiple myeloma in relapse ([place]) 04/05/2016  . Radiation 09/26/15-10/20/15   lower thoracic spine, upper lumbar spine 30 gray  . Stroke [facility])     Patient Active Problem List   Diagnosis Date Noted  . Protein-calorie malnutrition, severe 06/24/2019  . Leukopenia due to antineoplastic chemotherapy ([place]) 06/23/2019  . Nausea 06/23/2019  . Diarrhea 06/23/2019  . CKD (chronic kidney disease), stage III 06/23/2019  . HCAP (healthcare-associated pneumonia) 06/22/2019  . Hyperlipidemia LDL goal <70 04/20/2019  . Essential hypertension 04/20/2019  . Advanced age 54/03/2019  . Stroke ([place]) 04/19/2019  . Community acquired pneumonia 03/03/2019  . AKI (acute kidney injury) ([place]) 03/03/2019  . Normocytic anemia  03/03/2019  . Goals of care, counseling/discussion 02/04/2019  . Acute osteomyelitis of jaw 11/26/2018  . Bacteremia   . 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 Sepsis due to pneumonia ([place])   . Left lower lobe pneumonia 10/06/2018  . Right hip pain 09/01/2018  . Iron deficiency anemia secondary to inadequate dietary iron intake 07/18/2016  . Multiple myeloma in relapse ([place]) 04/05/2016  . 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 Multiple myeloma in remission ([place]) 09/08/2015  . Myeloma ([place]) 08/23/2011    Past Surgical History:  Procedure Laterality Date  . COLONOSCOPY    . HIP PINNING,CANNULATED Right 09/01/2018   Procedure: [facility] NAIL HIP PINNING;  Surgeon: Melrose Nakayama, MD;  Location: [place];  Service: Orthopedics;  Laterality: Right;  . LOOP RECORDER INSERTION N/A 04/21/2019   Procedure: LOOP RECORDER INSERTION;  Surgeon: Evans Lance, MD;  Location: [place] CV LAB;  Service: Cardiovascular;  Laterality: N/A;  . TEE WITHOUT CARDIOVERSION N/A 10/09/2018   Procedure: TRANSESOPHAGEAL ECHOCARDIOGRAM (TEE);  Surgeon: Acie Fredrickson Wonda Cheng, MD;  Location: [facility] ENDOSCOPY;  Service: Cardiovascular;  Laterality: N/A;  . TONSILLECTOMY         Family History  Problem Relation Age of Onset  . Hypertension Mother   . Hypertension Father   . Colon cancer Neg Hx   . Esophageal cancer Neg Hx   . Cancer Neg Hx   . Rectal cancer Neg Hx   . Stomach cancer Neg Hx     Social History   Tobacco Use  . Smoking  status: Never Smoker  . Smokeless tobacco: Never Used  . Tobacco comment: never used tobacco  Substance Use Topics  . Alcohol use: Yes    Alcohol/week: 4.0 standard drinks    Types: 4 Glasses of wine per week  . Drug use: No    Home Medications Prior to Admission medications   Medication Sig Start Date End Date Taking? Authorizing Provider  amLODipine-benazepril (LOTREL) 5-10 MG capsule TAKE ONE CAPSULE EACH DAY 09/22/19   [name], MD  aspirin  EC 81 MG EC tablet Take 1 tablet (81 mg total) by mouth daily. 04/22/19   Donzetta Starch, NP  bendamustine in sodium chloride 0.9 % 50 mL Inject into the vein once.    [provider]  dextromethorphan-guaiFENesin (MUCINEX DM) 30-600 MG 12hr tablet Take 1 tablet by mouth 2 (two) times daily as needed for cough. Patient not taking: Reported on 09/17/2019 04/21/19   Donzetta Starch, NP  dronabinol (MARINOL) 5 MG capsule TAKE ONE CAPSULE TWICE DAILY BEFORE MEALS 08/23/19   [name], MD  finasteride (PROSCAR) 5 MG tablet TAKE ONE TABLET EACH DAY Patient taking differently: Take 5 mg by mouth daily.  04/22/19   [name], MD  fluconazole (DIFLUCAN) 200 MG tablet Take 1 tablet (200 mg total) by mouth daily for 21 days. Take 400 mg on day one, then 200 mg daily for three weeks. 09/17/19 10/08/19  Cirigliano, Vito V, DO  LORazepam (ATIVAN) 0.5 MG tablet Take 1 tablet (0.5 mg total) by mouth every 6 (six) hours as needed (Nausea or vomiting). 02/10/19   [name], MD  Multiple Vitamin (MULTIVITAMIN WITH MINERALS) TABS tablet Take 1 tablet by mouth daily. 06/28/19   Nolberto Hanlon, MD  Multiple Vitamins-Minerals (QC MULTI-VITE 50 & OVER) TABS Take 1 tablet by mouth daily. 09/11/19   [provider]  nitrofurantoin, macrocrystal-monohydrate, (MACROBID) 100 MG capsule Take 1 capsule (100 mg total) by mouth 2 (two) times daily. 09/27/19   [name], MD  ondansetron (ZOFRAN) 8 MG tablet Take 1 tablet (8 mg total) by mouth 2 (two) times daily as needed for refractory nausea / vomiting. Start on day 2 after Bendamustine chemotherapy. 02/10/19   [name], MD  Palonosetron HCl (ALOXI IV) Inject into the vein.    [provider]  pantoprazole (PROTONIX) 40 MG tablet Take 1 tablet (40 mg total) by mouth 2 (two) times daily. 09/13/19   [name], MD  penicillin v potassium (VEETID) 500 MG tablet Take 1 tablet (500 mg total) by mouth 4 (four) times daily. #120 Patient  taking differently: Take 500 mg by mouth 4 (four) times daily.  04/27/19   Carlyle Basques, MD  polyvinyl alcohol (LIQUIFILM TEARS) 1.4 % ophthalmic solution Place 1 drop into both eyes as needed for dry eyes. 03/05/19   Swayze, Ava, DO  predniSONE (DELTASONE) 20 MG tablet Take 1 tablet (20 mg total) by mouth daily with breakfast. 06/22/19   [name], MD  Probiotic Product (PROBIOTIC PO) Take 1 capsule by mouth daily.    [provider]  prochlorperazine (COMPAZINE) 10 MG tablet Take 1 tablet (10 mg total) by mouth every 6 (six) hours as needed (nausea and vomiting). 02/10/19   [name], MD  pyridOXINE (VITAMIN B-6) 100 MG tablet Take 100 mg by mouth daily.  09/17/13   [name], MD  traMADol (ULTRAM) 50 MG tablet Take 1 tablet (50 mg total) by mouth every 8 (  eight) hours as needed for moderate pain. 04/21/19   Donzetta Starch, NP  vitamin B-12 (CYANOCOBALAMIN) 500 MCG tablet Take 500 mcg by mouth daily.    [provider]  Vitamin D, Ergocalciferol, (DRISDOL) 50000 UNITS CAPS capsule Take 50,000 Units by mouth every Sunday.     [provider]    Allergies    Sulfa antibiotics and Sulfasalazine  Review of Systems   Review of Systems  All other systems reviewed and are negative.   Physical Exam Updated Vital Signs There were no vitals taken for this visit.  Physical Exam Vitals and nursing note reviewed.   86 year old male, resting comfortably and in no acute distress. Vital signs are significant for elevated heart rate, respiratory rate, temperature. Oxygen saturation is 92%, which is normal. Head is normocephalic and atraumatic. PERRLA, EOMI. Oropharynx is clear. Neck is nontender and supple without adenopathy or JVD. Back is nontender and there is no CVA tenderness. Lungs have fine rales at the right base without wheezes or rhonchi. Chest is nontender. Heart has regular rate and rhythm without murmur. Abdomen is soft, flat, nontender  without masses or hepatosplenomegaly and peristalsis is normoactive. Extremities have no cyanosis or edema, full range of motion is present. Skin is warm and dry without rash. Neurologic: Mental status is normal, cranial nerves are intact, there are no motor or sensory deficits.  ED Results / Procedures / Treatments   Labs (all labs ordered are listed, but only abnormal results are displayed) Labs Reviewed  COMPREHENSIVE METABOLIC PANEL - Abnormal; Notable for the following components:      Result Value   Glucose, Bld 117 (*)    BUN 32 (*)    Creatinine, Ser 1.84 (*)    Total Protein 4.8 (*)    Albumin 2.1 (*)    AST 46 (*)    GFR calc non Af Amer 32 (*)    GFR calc Af Amer 37 (*)    All other components within normal limits  CBC WITH DIFFERENTIAL/PLATELET - Abnormal; Notable for the following components:   WBC 2.7 (*)    RBC 2.14 (*)    Hemoglobin 6.9 (*)    HCT 20.8 (*)    RDW 16.1 (*)    Platelets 105 (*)    Lymphs Abs 0.0 (*)    Abs Immature Granulocytes 0.23 (*)    All other components within normal limits  PROTIME-INR - Abnormal; Notable for the following components:   Prothrombin Time 16.0 (*)    INR 1.3 (*)    All other components within normal limits  CULTURE, BLOOD (ROUTINE X 2)  CULTURE, BLOOD (ROUTINE X 2)  URINE CULTURE  SARS CORONAVIRUS 2 (TAT 6-24 HRS)  LACTIC ACID, PLASMA  APTT  LACTIC ACID, PLASMA  URINALYSIS, ROUTINE W REFLEX MICROSCOPIC    EKG EKG Interpretation  Date/Time:  Tuesday September 28 2019 03:56:03 EST Ventricular Rate:  90 PR Interval:    QRS Duration: 81 QT Interval:  369 QTC Calculation: 452 R Axis:   74 Text Interpretation: Sinus rhythm Probable left atrial enlargement ST elevation suggests acute pericarditis When compared with ECG of 06/22/2019, No significant change was found Confirmed by Delora Fuel ([id number]) on 09/28/2019 4:11:34 AM   Radiology DG Chest Port 1 View  Result Date: 09/28/2019 CLINICAL DATA:  Fever and  weakness. EXAM: PORTABLE CHEST 1 VIEW COMPARISON:  06/22/2019 FINDINGS: Implanted loop recorder projects over the left chest wall. Multifocal interstitial and airspace opacities throughout both lungs,  right greater than left. Unchanged heart size and mediastinal contours. No pleural effusion or pneumothorax. Degenerative change in the spine. IMPRESSION: Bilateral interstitial and airspace opacities throughout both lungs, right greater than left, suspicious for multifocal pneumonia. Asymmetric pulmonary edema is felt less likely. Electronically Signed   By: Keith Rake M.D.   On: 09/28/2019 03:51   Procedures Procedures  CRITICAL CARE Performed by: Delora Fuel Total critical care time: 55 minutes Critical care time was exclusive of separately billable procedures and treating other patients. Critical care was necessary to treat or prevent imminent or life-threatening deterioration. Critical care was time spent personally by me on the following activities: development of treatment plan with patient and/or surrogate as well as nursing, discussions with consultants, evaluation of patient's response to treatment, examination of patient, obtaining history from patient or surrogate, ordering and performing treatments and interventions, ordering and review of laboratory studies, ordering and review of radiographic studies, pulse oximetry and re-evaluation of patient's condition.  Medications Ordered in ED Medications  metroNIDAZOLE (FLAGYL) IVPB 500 mg (500 mg Intravenous New Bag/Given 09/28/19 0439)  vancomycin (VANCOREADY) IVPB 1500 mg/300 mL (1,500 mg Intravenous New Bag/Given 09/28/19 0405)  ceFEPIme (MAXIPIME) 2 g in sodium chloride 0.9 % 100 mL IVPB (0 g Intravenous Stopped 09/28/19 0520)  acetaminophen (TYLENOL) tablet 650 mg (650 mg Oral Given 09/28/19 0427)    ED Course  I have reviewed the triage vital signs and the nursing notes.  Pertinent labs & imaging results that were available during  my care of the patient were reviewed by me and considered in my medical decision making (see chart for details).  MDM Rules/Calculators/A&P Fever in patient with multiple myeloma.  Old records are reviewed, and last CBC on record showed WBC of 1.9 and hemoglobin 5.3.  This was on February 1, and he did receive 2 units of packed red blood cells on February 2.  He has also been evaluated by gastroenterology for melena.  Upper endoscopy on February 5 showed oozing from a duodenal ulcer which was treated with a clip, and generalized erythema of the entire gastric mucosa.  He is started on sepsis pathway and will start antibiotics for sepsis of undetermined origin although respiratory infection is felt to be most likely.  Chest x-ray is consistent with multifocal pneumonia.  Labs show mild leukopenia but with adequate absolute neutrophil count.  Renal insufficiency is present and unchanged from baseline.  Anemia is present with hemoglobin 6.9.  Hemoglobin was 5.3 on February 1 and he had received 2 units of blood on February 2.  I am concerned that his hemoglobin should be higher and he may still be losing blood.  He has continued to be hemodynamically stable.  Case is discussed with Dr.Kakrakandy of [facility], who agrees to admit the patient.  HENRRY FEIL was evaluated in Emergency Department on 09/28/2019 for the symptoms described in the history of present illness. He was evaluated in the context of the global COVID-19 pandemic, which necessitated consideration that the patient might be at risk for infection with the SARS-CoV-2 virus that causes COVID-19. Institutional protocols and algorithms that pertain to the evaluation of patients at risk for COVID-19 are in a state of rapid change based on information released by regulatory bodies including the CDC and federal and state organizations. These policies and algorithms were followed during the patient's care in the ED.  Final Clinical Impression(s)  / ED Diagnoses Final diagnoses:  None    Rx / DC Orders ED  Discharge Orders    None       Delora Fuel, MD 17/35/67 [phone]

## 2019-09-28 NOTE — Consult Note (Addendum)
[place]  Telephone:([phone]) [phone] Fax:([phone] [place]  Reason for Consultation: IgG lambda myeloma and anemia  HPI: Christian Burns is an 86 year old male with a past medical history significant for IgG lambda myeloma, hypertension, chronic osteomyelitis of the jaw, CKD stage III, history of stroke.  He presented to the emergency room with complaints of weakness and subjective feeling of fever for 24 hours.  He was having a nonproductive cough x1 week.  In the ER, the patient had a temperature of 101.6 and a chest x-ray which showed multifocal pneumonia.  Labs on admission showed a white blood cell count of 2.7, hemoglobin 6.9, platelet count of 105,000.  His BUN was 32 and creatinine was 1.84.  Blood cultures were obtained and are pending.  COVID-19 testing was negative.  He is scheduled to receive 2 units PRBCs.  The patient is currently undergoing treatment for his myeloma.  He last received bendamustine and dexamethasone on 08/31/2019.  At his last visit, he was very anemic and received 2 units PRBCs.  He was referred to gastroenterology and was seen by them on 09/14/2019.  An EGD was recommended and this was performed on 09/17/2019.  Biopsies show any evidence of H. pylori, but there was a fair amount of inflammation in the stomach and small intestine.  The biopsies taken from the esophagus confirmed presence of a fungal infection.  It was recommended for him to continue his fluconazole and Protonix.  Earlier today, the patient became hypoxic, tachypneic, and tachycardic.  O2 sats were in the 60s on 2 L.  Rapid response was called and he was transferred to the stepdown unit.  He remains on IV antibiotics and he was given a dose of IV Lasix.    The patient reports that he is feeling better now.  States that he was unaware that he had low O2 sats.  He only noticed that he felt more tired.  He still has some generalized weakness.  He has mildly short of breath.   Continues to have intermittent low-grade fevers.  He is not reporting any chills.  No chest discomfort noted.  He has a nonproductive cough.  Denies abdominal pain, nausea, vomiting.  He has not noticed any recurrent melena.  Medical oncology was asked to the patient for continued management of his myeloma and anemia.    Past Medical History:  Diagnosis Date  . Anemia   . Blood transfusion without reported diagnosis   . CKD (chronic kidney disease) 07/06/2018   CKD stage III  . Goals of care, counseling/discussion 02/04/2019  . History of radiation therapy 06/17/13-07/06/13   35 gray to upper lumbar spine  . 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 Hypertension   . Inguinal hernia   . Multiple myeloma [facility]) Sept 2010  . Multiple myeloma in relapse ([place]) 04/05/2016  . Radiation 09/26/15-10/20/15   lower thoracic spine, upper lumbar spine 30 gray  . Stroke [facility])   :  Past Surgical History:  Procedure Laterality Date  . COLONOSCOPY    . HIP PINNING,CANNULATED Right 09/01/2018   Procedure: [facility] NAIL HIP PINNING;  Surgeon: Melrose Nakayama, MD;  Location: [place];  Service: Orthopedics;  Laterality: Right;  . LOOP RECORDER INSERTION N/A 04/21/2019   Procedure: LOOP RECORDER INSERTION;  Surgeon: Evans Lance, MD;  Location: [place] CV LAB;  Service: Cardiovascular;  Laterality: N/A;  . TEE WITHOUT CARDIOVERSION N/A 10/09/2018   Procedure: TRANSESOPHAGEAL ECHOCARDIOGRAM (TEE);  Surgeon: Acie Fredrickson,  Wonda Cheng, MD;  Location: [facility] ENDOSCOPY;  Service: Cardiovascular;  Laterality: N/A;  . TONSILLECTOMY    :  Current Facility-Administered Medications  Medication Dose Route Frequency Provider Last Rate Last Admin  . 0.9 %  sodium chloride infusion (Manually program via Guardrails IV Fluids)   Intravenous Once Hosie Poisson, MD      . acetaminophen (TYLENOL) tablet 650 mg  650 mg Oral Q6H PRN Hosie Poisson, MD       Or  . acetaminophen (TYLENOL) suppository 650 mg  650 mg Rectal Q6H PRN  Hosie Poisson, MD      . amLODipine (NORVASC) tablet 5 mg  5 mg Oral Daily Hosie Poisson, MD   5 mg at 09/28/19 1221  . azithromycin (ZITHROMAX) 500 mg in sodium chloride 0.9 % 250 mL IVPB  500 mg Intravenous Q24H Minda Ditto, RPH      . cefTRIAXone (ROCEPHIN) 2 g in sodium chloride 0.9 % 100 mL IVPB  2 g Intravenous Q24H Hosie Poisson, MD 200 mL/hr at 09/28/19 1244 2 g at 09/28/19 1244  . dronabinol (MARINOL) capsule 5 mg  5 mg Oral QAC lunch Hosie Poisson, MD   5 mg at 09/28/19 1221  . enoxaparin (LOVENOX) injection 30 mg  30 mg Subcutaneous Q24H Hosie Poisson, MD      . finasteride (PROSCAR) tablet 5 mg  5 mg Oral Daily Hosie Poisson, MD   5 mg at 09/28/19 1237  . fluconazole (DIFLUCAN) tablet 200 mg  200 mg Oral Daily Hosie Poisson, MD   200 mg at 09/28/19 1237  . hydrALAZINE (APRESOLINE) injection 10 mg  10 mg Intravenous Q4H PRN Hosie Poisson, MD      . LORazepam (ATIVAN) tablet 0.5 mg  0.5 mg Oral Q6H PRN Hosie Poisson, MD      . ondansetron (ZOFRAN) tablet 4 mg  4 mg Oral Q6H PRN Hosie Poisson, MD       Or  . ondansetron (ZOFRAN) injection 4 mg  4 mg Intravenous Q6H PRN Hosie Poisson, MD      . pantoprazole (PROTONIX) EC tablet 40 mg  40 mg Oral BID Hosie Poisson, MD   40 mg at 09/28/19 1237  . penicillin v potassium (VEETID) tablet 500 mg  500 mg Oral Q6H Hosie Poisson, MD   500 mg at 09/28/19 1226  . predniSONE (DELTASONE) tablet 20 mg  20 mg Oral Q breakfast Hosie Poisson, MD   20 mg at 09/28/19 1237  . pyridOXINE (VITAMIN B-6) tablet 100 mg  100 mg Oral Daily Hosie Poisson, MD      . traMADol (ULTRAM) tablet 50 mg  50 mg Oral Q8H PRN Hosie Poisson, MD      . Derrill Memo ON 09/30/2019] vancomycin (VANCOREADY) IVPB 1250 mg/250 mL  1,250 mg Intravenous Q48H Hosie Poisson, MD      . Derrill Memo ON 10/03/2019] Vitamin D (Ergocalciferol) (DRISDOL) capsule 50,000 Units  50,000 Units Oral Q Thurnell Lose, MD         Allergies  Allergen Reactions  . Sulfa Antibiotics Itching  . Sulfasalazine  Itching  :  Family History  Problem Relation Age of Onset  . Hypertension Mother   . Hypertension Father   . Colon cancer Neg Hx   . Esophageal cancer Neg Hx   . Cancer Neg Hx   . Rectal cancer Neg Hx   . Stomach cancer Neg Hx   :  Social History   Socioeconomic History  . Marital status: Married  Spouse name: Christian Burns  . Number of children: Not on file  . Years of education: Not on file  . Highest education level: Not on file  Occupational History  . Occupation: retired  Tobacco Use  . Smoking status: Never Smoker  . Smokeless tobacco: Never Used  . Tobacco comment: never used tobacco  Substance and Sexual Activity  . Alcohol use: Yes    Alcohol/week: 4.0 standard drinks    Types: 4 Glasses of wine per week  . Drug use: No  . Sexual activity: Not on file  Other Topics Concern  . Not on file  Social History Narrative   Christian Burns is retired and lives at [employer] at [address] retirement apartment with his wife, Christian Burns. They have assist from their son, Christian Burns and grandson, Christian Burns. He is independent/assist with his care needs His son and/or grandson assists with transportation to medical appointments   Social Determinants of Health   Financial Resource Strain: Low Risk   . Difficulty of Paying Living Expenses: Not hard at all  Food Insecurity: No Food Insecurity  . Worried About Charity fundraiser in the Last Year: Never true  . Ran Out of Food in the Last Year: Never true  Transportation Needs: No Transportation Needs  . Lack of Transportation (Medical): No  . Lack of Transportation (Non-Medical): No  Physical Activity:   . Days of Exercise per Week: Not on file  . Minutes of Exercise per Session: Not on file  Stress:   . Feeling of Stress : Not on file  Social Connections: Unknown  . Frequency of Communication with Friends and Family: More than three times a week  . Frequency of Social Gatherings with Friends and Family: More than three times a week  . Attends  Religious Services: Not on file  . Active Member of Clubs or Organizations: Not on file  . Attends Archivist Meetings: Not on file  . Marital Status: Married  Human resources officer Violence:   . Fear of Current or Ex-Partner: Not on file  . Emotionally Abused: Not on file  . Physically Abused: Not on file  . Sexually Abused: Not on file  :  Review of Systems: A comprehensive 14 point review of systems was negative except as noted in the HPI.  Exam: Patient Vitals for the past 24 hrs:  BP Temp Temp src Pulse Resp SpO2 Height Weight  09/28/19 1254 -- -- -- -- (!) 25 (!) 83 % -- --  09/28/19 1249 -- -- -- -- (!) 27 98 % -- --  09/28/19 1245 -- -- -- -- 20 -- -- --  09/28/19 1240 -- -- -- -- (!) 27 99 % -- --  09/28/19 1234 -- -- -- -- 14 100 % -- --  09/28/19 1224 (!) 163/79 -- -- -- 18 100 % -- --  09/28/19 1221 (!) 145/71 -- -- -- -- -- -- --  09/28/19 1217 -- -- -- -- -- 99 % -- --  09/28/19 1204 -- -- -- -- (!) 43 -- -- --  09/28/19 1200 -- -- -- -- (!) 33 98 % -- --  09/28/19 1155 -- -- -- -- -- 98 % -- --  09/28/19 1150 -- 99.3 F (37.4 C) Rectal -- (!) 28 98 % -- --  09/28/19 1148 -- -- -- -- (!) 42 (!) 77 % -- --  09/28/19 1030 137/73 -- -- 85 (!) 28 (!) 89 % -- --  09/28/19 1000 138/71 -- --  81 (!) 24 (!) 89 % -- --  09/28/19 0930 139/69 -- -- 82 (!) 27 92 % -- --  09/28/19 0900 133/72 -- -- 80 (!) 24 (!) 88 % -- --  09/28/19 0800 128/73 -- -- 83 20 90 % -- --  09/28/19 0730 123/69 -- -- 84 20 (!) 88 % -- --  09/28/19 0700 126/74 -- -- 82 20 (!) 89 % -- --  09/28/19 0630 (!) 145/65 -- -- 86 (!) 23 (!) 88 % -- --  09/28/19 0600 126/73 -- -- 81 (!) 22 90 % -- --  09/28/19 0531 -- 98.6 F (37 C) Oral -- -- -- -- --  09/28/19 0530 123/73 -- -- 82 (!) 22 92 % -- --  09/28/19 0500 131/71 -- -- 84 20 92 % -- --  09/28/19 0430 135/73 -- -- 88 19 94 % -- --  09/28/19 0400 130/67 -- -- 88 (!) 26 97 % -- --  09/28/19 0330 129/63 -- -- 95 (!) 29 95 % -- --  09/28/19 0314  -- -- -- -- -- -- 6' (1.829 m) 140 lb (63.5 kg)  09/28/19 0310 130/69 (!) 101.6 F (38.7 C) Oral (!) 109 (!) 25 92 % -- --    General: Frail elderly male in no distress Eyes:  no scleral icterus.   ENT:  There were no oropharyngeal lesions.   Neck was without thyromegaly.   Lymphatics:  Negative cervical, supraclavicular or axillary adenopathy.   Respiratory: Rales at bases, diminished air entry at the lung bases Cardiovascular:  Regular rate and rhythm, S1/S2, without murmur, rub or gallop.  There was no pedal edema.   GI:  abdomen was soft, flat, nontender, nondistended, without organomegaly.  Musculoskeletal:  no spinal tenderness of palpation of vertebral spine.   Skin exam was without echymosis, petichae.   Neuro exam was nonfocal. Patient was alert and oriented.  Attention was good.   Language was appropriate.  Mood was normal without depression.  Speech was not pressured.  Thought content was not tangential.     Lab Results  Component Value Date   WBC 2.7 (L) 09/28/2019   HGB 6.9 (LL) 09/28/2019   HCT 20.8 (L) 09/28/2019   PLT 105 (L) 09/28/2019   GLUCOSE 117 (H) 09/28/2019   CHOL 148 04/20/2019   TRIG 96 04/20/2019   HDL 58 04/20/2019   LDLCALC 71 04/20/2019   ALT 17 09/28/2019   AST 46 (H) 09/28/2019   NA 137 09/28/2019   K 3.6 09/28/2019   CL 105 09/28/2019   CREATININE 1.84 (H) 09/28/2019   BUN 32 (H) 09/28/2019   CO2 22 09/28/2019    DG CHEST PORT 1 VIEW  Result Date: 09/28/2019 CLINICAL DATA:  Hypoxia. EXAM: PORTABLE CHEST 1 VIEW COMPARISON:  Chest radiograph 09/28/2019 FINDINGS: Redemonstrated implanted loop recorder device which projects in the region of the left chest wall. Heart size within normal limits. Redemonstrated multifocal bilateral airspace disease most prominent within the right lung apex. Findings is not significant changed since prior examination performed earlier the same day. No evidence of pleural effusion or pneumothorax. No acute bony  abnormality. Thoracic spondylosis. Overlying cardiac monitoring leads. IMPRESSION: Bilateral multifocal airspace disease most prominent within the right lung apex, not significantly changed since chest radiograph performed earlier the same day. Findings likely reflect multifocal pneumonia. Edema is also a consideration. Electronically Signed   By: Kellie Simmering DO   On: 09/28/2019 12:44   DG Chest Port 1 [address]  Result Date: 09/28/2019 CLINICAL DATA:  Fever and weakness. EXAM: PORTABLE CHEST 1 VIEW COMPARISON:  06/22/2019 FINDINGS: Implanted loop recorder projects over the left chest wall. Multifocal interstitial and airspace opacities throughout both lungs, right greater than left. Unchanged heart size and mediastinal contours. No pleural effusion or pneumothorax. Degenerative change in the spine. IMPRESSION: Bilateral interstitial and airspace opacities throughout both lungs, right greater than left, suspicious for multifocal pneumonia. Asymmetric pulmonary edema is felt less likely. Electronically Signed   By: Keith Rake M.D.   On: 09/28/2019 03:51   CUP PACEART REMOTE DEVICE CHECK  Result Date: 09/06/2019 [facility] summary report received. Battery status OK. Normal device function. No new symptom episodes, tachy episodes, brady, or pause episodes. No new AF episodes. Monthly summary reports and ROV/PRN    DG CHEST PORT 1 VIEW  Result Date: 09/28/2019 CLINICAL DATA:  Hypoxia. EXAM: PORTABLE CHEST 1 VIEW COMPARISON:  Chest radiograph 09/28/2019 FINDINGS: Redemonstrated implanted loop recorder device which projects in the region of the left chest wall. Heart size within normal limits. Redemonstrated multifocal bilateral airspace disease most prominent within the right lung apex. Findings is not significant changed since prior examination performed earlier the same day. No evidence of pleural effusion or pneumothorax. No acute bony abnormality. Thoracic spondylosis. Overlying cardiac monitoring  leads. IMPRESSION: Bilateral multifocal airspace disease most prominent within the right lung apex, not significantly changed since chest radiograph performed earlier the same day. Findings likely reflect multifocal pneumonia. Edema is also a consideration. Electronically Signed   By: Kellie Simmering DO   On: 09/28/2019 12:44   DG Chest Port 1 View  Result Date: 09/28/2019 CLINICAL DATA:  Fever and weakness. EXAM: PORTABLE CHEST 1 VIEW COMPARISON:  06/22/2019 FINDINGS: Implanted loop recorder projects over the left chest wall. Multifocal interstitial and airspace opacities throughout both lungs, right greater than left. Unchanged heart size and mediastinal contours. No pleural effusion or pneumothorax. Degenerative change in the spine. IMPRESSION: Bilateral interstitial and airspace opacities throughout both lungs, right greater than left, suspicious for multifocal pneumonia. Asymmetric pulmonary edema is felt less likely. Electronically Signed   By: Keith Rake M.D.   On: 09/28/2019 03:51   CUP PACEART REMOTE DEVICE CHECK  Result Date: 09/06/2019 [facility]link summary report received. Battery status OK. Normal device function. No new symptom episodes, tachy episodes, brady, or pause episodes. No new AF episodes. Monthly summary reports and ROV/PRN  Assessment and Plan:  1.  IgG lambda myeloma 2.  Anemia due to underlying malignancy, recent chemotherapy, renal insufficiency, and recent melena.  Could also have bone marrow suppression secondary to acute infection. 3.  Multifocal pneumonia with possible developing early sepsis 4.  Hypertension 5.  Acute on chronic kidney disease 6.  Leukopenia and thrombocytopenia due to recent chemotherapy and acute infection 7.  History of CVA 8.  Chronic osteomyelitis of the jaw  -Chemotherapy scheduled for later this week will be canceled secondary to acute infection. -The patient is more anemic today.  Agree with transfusion of 2 units PRBCs today.  Follow daily  CBC and transfuse for hemoglobin less than 7. -The patient's hypoxia and respiratory status seem to be improved following IV Lasix earlier today.  Continue IV antibiotics and await culture results. -We will closely monitor his leukopenia and thrombocytopenia.  No transfusion or growth factor support needed at this time.  Thank you for this referral.   Mikey Bussing, DNP, AGPCNP-BC, AOCNP   ADDENDUM: I saw and examined Christian Burns this morning.  I agree with  the above assessment by Erasmo Downer.  I am little bit surprised by the fact that he gets so anemic after treatment.  This treatment that he is getting for myeloma-bendamustine-has worked incredibly well.  His light chains have come down incredibly nicely.  We are giving him IVIG.  I will check his immunoglobulin level.  His last IgG level was I think 966 mg/dL.  I do think that he will need another pint of blood.  I do still think that he is able to make all that much blood.  I am rechecking an erythropoietin level on him and he may benefit from ESA.  He actually looks pretty good.  He still little bit on the hypoxic side with an O2 saturation of 90%.  He definitely is at risk for infection.  Thankfully, his COVID-19 test was negative.  I appreciate the outstanding care that he is getting from everybody in the ICU.  I know that he is going to get the best care possible.  We will follow along.  Lattie Haw, MD  [place] 6:9

## 2019-09-28 NOTE — Significant Event (Signed)
Rapid Response Event Note  Overview: Time Called: [id number] Arrival Time: 1148 Event Type: Respiratory  Initial Focused Assessment:  RRT called due to oxygen saturations in the 60's. Tachypnic, rates in the 40's. Patient placed on non-re breather. On arrival patient O2 saturation 90%. Patient shivering with apparent rigors. Tachypnic but no labored respirations. Good air movement through out lung fields. Patient had just finished Face Timing his family. Hgb noted to be low and plan for blood transfusion. Fluids and antibiotics administered in ED for multifocal PNA. Rectal temp obtained 99.3. Lab work including ABG obtained. MD at bedside. Plan to transfer to [place] and MD to call family to update. Patient now on 15L humidified HFNC with O2 saturation 98%.  Interventions:  ABG  Oxygen Titration Rectal Temp Chest Xray  Plan of Care (if not transferred):  Transfer to SD   Event Summary: Name of Physician Notified: Venia Minks. MD at 1150    at    Outcome: Stayed in room and stabalized  Event End Time: [place]

## 2019-09-28 NOTE — Progress Notes (Signed)
Pharmacy Antibiotic Note  Christian Burns is a 86 y.o. male admitted on 09/28/2019 with pneumonia.  Pharmacy has been consulted for vancomycin dosing.  Plan:  Vancomycin 1500 mg IV x1 in ED, then 1250 mg IV q24h    Ceftriaxone 2 gr IV q24h (MD)  Azithromycin 500 mg IV q24h ( MD)   Monitor clinical course, renal function, cultures as available   Height: 6' (182.9 cm) Weight: 140 lb (63.5 kg) IBW/kg (Calculated) : 77.6  Temp (24hrs), Avg:100.1 F (37.8 C), Min:98.6 F (37 C), Max:101.6 F (38.7 C)  Recent Labs  Lab 09/28/19 0335 09/28/19 0519  WBC 2.7*  --   CREATININE 1.84*  --   LATICACIDVEN 1.9 0.8    Estimated Creatinine Clearance: 25.4 mL/min (A) (by C-G formula based on SCr of 1.84 mg/dL (H)).    Allergies  Allergen Reactions  . Sulfa Antibiotics Itching  . Sulfasalazine Itching    Antimicrobials this admission: 2/16 cefepime x1 2/16 ceftriaxone >>   2/16 flagyl >>  2/16 vancomycin >>  Dose adjustments this admission:    Microbiology results: 2/16 BCx:  2/16 UCx:      Thank you for allowing pharmacy to be a part of this patient's care.   Royetta Asal, PharmD, BCPS 09/28/2019 7:28 AM

## 2019-09-28 NOTE — Progress Notes (Signed)
Patient was admitted earlier today by Dr. Hal Hope please see his note for detailed H&P. Christian Burns is is 86 year old with history of multiple myeloma status post chemo therapy, on blood transfusions last transfusion was last week, essential hypertension, chronic osteomyelitis of the jaw, stage III CKD, history of stroke in the past presents to ED with generalized weakness and fever, cough-shortness of breath.  He was admitted for multifocal pneumonia and was started on empiric antibiotics.  COVID-19 screening test was.    Rapid response was called around 12:00 for hypoxia.  Patient was alert but tachypneic, tachycardic and his oxygen saturations were in 60s on 2 L of nasal cannula oxygen.  His rectal temperature was 99.3.  He was put on nonrebreather and ABG was obtained which showed hypoxemia with PCO2 of 29, PO2 of 54.8.  And pH of 7.473 He was put on 15 L of high flow oxygen, stat chest x-ray was obtained.  On exam   Patient is alert and able to answer all questions appropriately and oriented to place person and time. Cardiovascular S1-S2 heard, tachycardic, no JVD, no pedal edema.  Lungs scattered rails at bases, diminished air entry at bases, tachypneic Abdomen is soft, nontender, nondistended, bowel sounds okay    Plan  #1 continue with empiric antibiotics, give 1 dose of IV Lasix 40 mg, check for urine output. #2 get chest x-ray and follow the results. #3 2 units of PRBC transfusion ordered and repeat posttransfusion H&H. #4 transfer the patient to stepdown and monitor closely. #5 BiPAP as needed   Discussed with the patient's son and Dr. Lucianne Lei and never over the phone and updated of the patient's condition.   Hosie Poisson, MD [phone]

## 2019-09-28 NOTE — Progress Notes (Signed)
Patient able to be titrated down to 4L [place] while resting but when speaking, drinking or any exertion SPO2 drops into the high 70s range. O2 increased back to 8L HFNC. Will continue to try and titrate O2 down as able.

## 2019-09-28 NOTE — ED Notes (Addendum)
Date and time results received: 09/28/19 5:10 AM  (use smartphrase ".now" to insert current time)  Test: Hemoglobin Critical Value: 6.9  Name of Provider Notified: Dr.Glick  Orders Received? Or Actions Taken?:

## 2019-09-28 NOTE — Progress Notes (Signed)
RT called to room 1426. Pt sats in the 70's. Pt had NRB on with sats going up to 99%. Rt placed 15 LPM HF [place] on pt stas 99%. ABG obtained, no further orders at this time. Pt being transferred to step down.

## 2019-09-28 NOTE — H&P (Signed)
History and Physical    Christian Burns:[id number] DOB: [birth year] DOA: 09/28/2019  PCP: Leanna Battles, MD  Patient coming from: Home.  Chief Complaint: Weakness and fever.  HPI: Christian Burns is a 86 y.o. male with history of multiple myeloma on chemotherapy last 1 last month received blood transfusion last week, hypertension, chronic osteomyelitis of the jaw, chronic kidney disease stage III, stroke presents to the ER the patient was feeling weak over the last 24 hours with subjective feeling of fever.  Has been a nonproductive cough for last 1 week.  Denies any chest pain nausea vomiting or diarrhea.  ED Course: In the ER patient's blood pressure was normal temperature 101.6 F with chest x-ray showing multifocal pneumonia.  Lab work show pancytopenia with WBC of 2.7 neutrophil count of 83% hemoglobin 6.9 platelets 105 creatinine 1.84 albumin 2.1.  Covid test is pending patient started on empiric antibiotics and admitted for further management.  Review of Systems: As per HPI, rest all negative.   Past Medical History:  Diagnosis Date  . Anemia   . Blood transfusion without reported diagnosis   . CKD (chronic kidney disease) 07/06/2018   CKD stage III  . Goals of care, counseling/discussion 02/04/2019  . History of radiation therapy 06/17/13-07/06/13   35 gray to upper lumbar spine  . 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 Hypertension   . Inguinal hernia   . Multiple myeloma [facility]) Sept 2010  . Multiple myeloma in relapse ([place]) 04/05/2016  . Radiation 09/26/15-10/20/15   lower thoracic spine, upper lumbar spine 30 gray  . Stroke [facility])     Past Surgical History:  Procedure Laterality Date  . COLONOSCOPY    . HIP PINNING,CANNULATED Right 09/01/2018   Procedure: [facility] NAIL HIP PINNING;  Surgeon: Melrose Nakayama, MD;  Location: [place];  Service: Orthopedics;  Laterality: Right;  . LOOP RECORDER INSERTION N/A 04/21/2019   Procedure: LOOP RECORDER INSERTION;  Surgeon:  Evans Lance, MD;  Location: [place] CV LAB;  Service: Cardiovascular;  Laterality: N/A;  . TEE WITHOUT CARDIOVERSION N/A 10/09/2018   Procedure: TRANSESOPHAGEAL ECHOCARDIOGRAM (TEE);  Surgeon: Acie Fredrickson Wonda Cheng, MD;  Location: [facility] ENDOSCOPY;  Service: Cardiovascular;  Laterality: N/A;  . TONSILLECTOMY       reports that he has never smoked. He has never used smokeless tobacco. He reports current alcohol use of about 4.0 standard drinks of alcohol per week. He reports that he does not use drugs.  Allergies  Allergen Reactions  . Sulfa Antibiotics Itching  . Sulfasalazine Itching    Family History  Problem Relation Age of Onset  . Hypertension Mother   . Hypertension Father   . Colon cancer Neg Hx   . Esophageal cancer Neg Hx   . Cancer Neg Hx   . Rectal cancer Neg Hx   . Stomach cancer Neg Hx     Prior to Admission medications   Medication Sig Start Date End Date Taking? Authorizing Provider  amLODipine-benazepril (LOTREL) 5-10 MG capsule TAKE ONE CAPSULE EACH DAY 09/22/19   [name], MD  aspirin EC 81 MG EC tablet Take 1 tablet (81 mg total) by mouth daily. 04/22/19   Donzetta Starch, NP  bendamustine in sodium chloride 0.9 % 50 mL Inject into the vein once.    [provider]  dextromethorphan-guaiFENesin (MUCINEX DM) 30-600 MG 12hr tablet Take 1 tablet by mouth 2 (two) times daily as needed for cough. Patient not taking: Reported on 09/17/2019 04/21/19  Donzetta Starch, NP  dronabinol (MARINOL) 5 MG capsule TAKE ONE CAPSULE TWICE DAILY BEFORE MEALS 08/23/19   [name], MD  finasteride (PROSCAR) 5 MG tablet TAKE ONE TABLET EACH DAY Patient taking differently: Take 5 mg by mouth daily.  04/22/19   [name], MD  fluconazole (DIFLUCAN) 200 MG tablet Take 1 tablet (200 mg total) by mouth daily for 21 days. Take 400 mg on day one, then 200 mg daily for three weeks. 09/17/19 10/08/19  Cirigliano, Vito V, DO  LORazepam (ATIVAN) 0.5 MG tablet Take 1 tablet (0.5  mg total) by mouth every 6 (six) hours as needed (Nausea or vomiting). 02/10/19   [name], MD  Multiple Vitamin (MULTIVITAMIN WITH MINERALS) TABS tablet Take 1 tablet by mouth daily. 06/28/19   Nolberto Hanlon, MD  Multiple Vitamins-Minerals (QC MULTI-VITE 50 & OVER) TABS Take 1 tablet by mouth daily. 09/11/19   [provider]  nitrofurantoin, macrocrystal-monohydrate, (MACROBID) 100 MG capsule Take 1 capsule (100 mg total) by mouth 2 (two) times daily. 09/27/19   [name], MD  ondansetron (ZOFRAN) 8 MG tablet Take 1 tablet (8 mg total) by mouth 2 (two) times daily as needed for refractory nausea / vomiting. Start on day 2 after Bendamustine chemotherapy. 02/10/19   [name], MD  Palonosetron HCl (ALOXI IV) Inject into the vein.    [provider]  pantoprazole (PROTONIX) 40 MG tablet Take 1 tablet (40 mg total) by mouth 2 (two) times daily. 09/13/19   [name], MD  penicillin v potassium (VEETID) 500 MG tablet Take 1 tablet (500 mg total) by mouth 4 (four) times daily. #120 Patient taking differently: Take 500 mg by mouth 4 (four) times daily.  04/27/19   Carlyle Basques, MD  polyvinyl alcohol (LIQUIFILM TEARS) 1.4 % ophthalmic solution Place 1 drop into both eyes as needed for dry eyes. 03/05/19   Swayze, Ava, DO  predniSONE (DELTASONE) 20 MG tablet Take 1 tablet (20 mg total) by mouth daily with breakfast. 06/22/19   [name], MD  Probiotic Product (PROBIOTIC PO) Take 1 capsule by mouth daily.    [provider]  prochlorperazine (COMPAZINE) 10 MG tablet Take 1 tablet (10 mg total) by mouth every 6 (six) hours as needed (nausea and vomiting). 02/10/19   [name], MD  pyridOXINE (VITAMIN B-6) 100 MG tablet Take 100 mg by mouth daily.  09/17/13   [name], MD  traMADol (ULTRAM) 50 MG tablet Take 1 tablet (50 mg total) by mouth every 8 (eight) hours as needed for moderate pain. 04/21/19   Donzetta Starch, NP  vitamin B-12  (CYANOCOBALAMIN) 500 MCG tablet Take 500 mcg by mouth daily.    [provider]  Vitamin D, Ergocalciferol, (DRISDOL) 50000 UNITS CAPS capsule Take 50,000 Units by mouth every Sunday.     [provider]    Physical Exam: Constitutional: Moderately built and nourished. Vitals:   09/28/19 0530 09/28/19 0531 09/28/19 0600 09/28/19 0630  BP: 123/73  126/73 (!) 145/65  Pulse: 82  81 86  Resp: (!) 22  (!) 22 (!) 23  Temp:  98.6 F (37 C)    TempSrc:  Oral    SpO2: 92%  90% (!) 88%  Weight:      Height:       Eyes: Anicteric no pallor. ENMT: No discharge from the ears eyes nose or mouth. Neck: No mass felt.  No neck rigidity. Respiratory:  No rhonchi or crepitations. Cardiovascular: S1-S2 heard. Abdomen: Soft nontender bowel sound present. Musculoskeletal: No edema.  No joint effusion. Skin: No rash. Neurologic: Alert awake oriented to time place and person.  Moves all extremities. Psychiatric: Appears normal per normal affect.   Labs on Admission: I have personally reviewed following labs and imaging studies  CBC: Recent Labs  Lab 09/28/19 0335  WBC 2.7*  NEUTROABS 2.3  HGB 6.9*  HCT 20.8*  MCV 97.2  PLT 732*   Basic Metabolic Panel: Recent Labs  Lab 09/28/19 0335  NA 137  K 3.6  CL 105  CO2 22  GLUCOSE 117*  BUN 32*  CREATININE 1.84*  CALCIUM 9.0   GFR: Estimated Creatinine Clearance: 25.4 mL/min (A) (by C-G formula based on SCr of 1.84 mg/dL (H)). Liver Function Tests: Recent Labs  Lab 09/28/19 0335  AST 46*  ALT 17  ALKPHOS 43  BILITOT 1.2  PROT 4.8*  ALBUMIN 2.1*   No results for input(s): LIPASE, AMYLASE in the last 168 hours. No results for input(s): AMMONIA in the last 168 hours. Coagulation Profile: Recent Labs  Lab 09/28/19 0335  INR 1.3*   Cardiac Enzymes: No results for input(s): CKTOTAL, CKMB, CKMBINDEX, TROPONINI in the last 168 hours. BNP (last 3 results) No results for input(s): PROBNP in the last 8760  hours. HbA1C: No results for input(s): HGBA1C in the last 72 hours. CBG: No results for input(s): GLUCAP in the last 168 hours. Lipid Profile: No results for input(s): CHOL, HDL, LDLCALC, TRIG, CHOLHDL, LDLDIRECT in the last 72 hours. Thyroid Function Tests: No results for input(s): TSH, T4TOTAL, FREET4, T3FREE, THYROIDAB in the last 72 hours. Anemia Panel: No results for input(s): VITAMINB12, FOLATE, FERRITIN, TIBC, IRON, RETICCTPCT in the last 72 hours. Urine analysis:    Component Value Date/Time   COLORURINE YELLOW 06/22/2019 1742   APPEARANCEUR CLEAR 06/22/2019 1742   LABSPEC 1.013 06/22/2019 1742   LABSPEC 1.020 09/17/2013 1036   PHURINE 5.0 06/22/2019 1742   GLUCOSEU NEGATIVE 06/22/2019 1742   HGBUR SMALL (A) 06/22/2019 1742   BILIRUBINUR NEGATIVE 06/22/2019 1742   KETONESUR NEGATIVE 06/22/2019 1742   PROTEINUR 100 (A) 06/22/2019 1742   UROBILINOGEN 0.2 09/17/2013 1036   NITRITE NEGATIVE 06/22/2019 1742   LEUKOCYTESUR NEGATIVE 06/22/2019 1742   Sepsis Labs: @LABRCNTIP (procalcitonin:4,lacticidven:4) )No results found for this or any previous visit (from the past 240 hour(s)).   Radiological Exams on Admission: DG Chest Port 1 View  Result Date: 09/28/2019 CLINICAL DATA:  Fever and weakness. EXAM: PORTABLE CHEST 1 VIEW COMPARISON:  06/22/2019 FINDINGS: Implanted loop recorder projects over the left chest wall. Multifocal interstitial and airspace opacities throughout both lungs, right greater than left. Unchanged heart size and mediastinal contours. No pleural effusion or pneumothorax. Degenerative change in the spine. IMPRESSION: Bilateral interstitial and airspace opacities throughout both lungs, right greater than left, suspicious for multifocal pneumonia. Asymmetric pulmonary edema is felt less likely. Electronically Signed   By: Keith Rake M.D.   On: 09/28/2019 03:51    EKG: Independently reviewed.  Normal sinus rhythm with nonspecific  changes.  Assessment/Plan Active Problems:   Myeloma ([place])   Sepsis due to undetermined organism ([place])   Acute osteomyelitis of jaw   Normochromic normocytic anemia   CKD (chronic kidney disease), stage III   SIRS (systemic inflammatory response syndrome) ([facility])   Multifocal pneumonia   Anemia associated with chemotherapy    1. Possible developing sepsis from multifocal pneumonia for which patient is on empiric antibiotics.  Follow  labs after transfusion.  Follow blood cultures.  Covid test is pending. 2. Symptomatic anemia with patient feeling weak has received 1 unit of PRBC last week hemoglobin is presently around 6.9.  Will give 1 more unit of PRBC.  Follow CBC after transfusion. 3. Hypertension presently on Lotrel.  Will continue amlodipine hold benazepril due to progressive worsening renal function.  Keep patient as needed IV hydralazine. 4. Chronic kidney disease stage III creatinine has worsened from last month.  Will hold off benazepril 1 unit of PRBC has been ordered.  Follow metabolic panel after transfusion. 5. Pancytopenia likely from chemotherapy.  Follow CBC. 6. Multiple myeloma being followed by Dr. Burney Gauze.  Please notify Dr. Marin Olp in the morning. 7. History of stroke on aspirin. 8. Chronic osteomyelitis of the jaw on suppressive therapy with penicillin.  No acute issues at this time.  Follow cultures.  Given the septic-like picture on presentation will need inpatient status.  Covid test is pending.   DVT prophylaxis: Lovenox.  Patient does have low platelet counts so closely follow. Code Status: DNR as confirmed with patient. Family Communication: Discussed with patient. Disposition Plan: Home. Consults called: None. Admission status: Inpatient.   Rise Patience MD [facility] Pager [phone].  If 7PM-7AM, please contact night-coverage [email or web address] Password [facility]  09/28/2019, 7:02 AM

## 2019-09-28 NOTE — Progress Notes (Signed)
A consult was received from an ED physician for cefepime and vancomycin per pharmacy dosing.  The patient's profile has been reviewed for ht/wt/allergies/indication/available labs.   A one time order has been placed for Cefepime 2 gm and Vancomycin 1500 mg.  Further antibiotics/pharmacy consults should be ordered by admitting physician if indicated.                       Thank you, Dorrene German 09/28/2019  3:29 AM

## 2019-09-28 NOTE — ED Triage Notes (Signed)
[place], fall, back in bed when EMS arrived. Weak and nauseated. Cough, fever 101.5. AOX4. No injuries noted. Non rebreather, lowest sats 60%.

## 2019-09-28 NOTE — Progress Notes (Signed)
Pt had 2 units of blood ordered to transfuse. 1 unit has been transferred and RN was informed that the other unit of blood to be transfused was accidentally cancelled by lab. Other unit is being ordered and will not be here until in the morning according to lab. On call triad NP Sharlet Salina was notified and RN will order an H&H to follow up with the first unit as ordered by provider. RN will await the arrival of the 2nd unit.

## 2019-09-28 NOTE — Progress Notes (Signed)
Pt resting comfortably on 4 LPM [place].  Pt in no noted distress, BIPAP not indicated at this time.  RT to monitor and assess as needed.

## 2019-09-29 ENCOUNTER — Inpatient Hospital Stay: Payer: Medicare HMO | Admitting: Family

## 2019-09-29 ENCOUNTER — Inpatient Hospital Stay: Payer: Medicare HMO

## 2019-09-29 ENCOUNTER — Other Ambulatory Visit: Payer: Medicare HMO

## 2019-09-29 DIAGNOSIS — Z8249 Family history of ischemic heart disease and other diseases of the circulatory system: Secondary | ICD-10-CM

## 2019-09-29 DIAGNOSIS — N1831 Chronic kidney disease, stage 3a: Secondary | ICD-10-CM

## 2019-09-29 DIAGNOSIS — T451X5A Adverse effect of antineoplastic and immunosuppressive drugs, initial encounter: Secondary | ICD-10-CM

## 2019-09-29 DIAGNOSIS — Z7289 Other problems related to lifestyle: Secondary | ICD-10-CM

## 2019-09-29 DIAGNOSIS — N183 Chronic kidney disease, stage 3 unspecified: Secondary | ICD-10-CM

## 2019-09-29 DIAGNOSIS — N179 Acute kidney failure, unspecified: Secondary | ICD-10-CM

## 2019-09-29 DIAGNOSIS — C9000 Multiple myeloma not having achieved remission: Secondary | ICD-10-CM

## 2019-09-29 DIAGNOSIS — M272 Inflammatory conditions of jaws: Secondary | ICD-10-CM

## 2019-09-29 DIAGNOSIS — I10 Essential (primary) hypertension: Secondary | ICD-10-CM

## 2019-09-29 DIAGNOSIS — Z79899 Other long term (current) drug therapy: Secondary | ICD-10-CM

## 2019-09-29 DIAGNOSIS — D701 Agranulocytosis secondary to cancer chemotherapy: Secondary | ICD-10-CM

## 2019-09-29 DIAGNOSIS — D630 Anemia in neoplastic disease: Secondary | ICD-10-CM

## 2019-09-29 LAB — CREATININE, SERUM
Creatinine, Ser: 2.12 mg/dL — ABNORMAL HIGH (ref 0.61–1.24)
GFR calc Af Amer: 31 mL/min — ABNORMAL LOW (ref >60)
GFR calc non Af Amer: 27 mL/min — ABNORMAL LOW (ref >60)

## 2019-09-29 LAB — PREPARE RBC (CROSSMATCH)

## 2019-09-29 LAB — HEMOGLOBIN AND HEMATOCRIT, BLOOD
HCT: 26.7 % — ABNORMAL LOW (ref 39.0–52.0)
Hemoglobin: 8.6 g/dL — ABNORMAL LOW (ref 13.0–17.0)

## 2019-09-29 LAB — MRSA PCR SCREENING: MRSA by PCR: NEGATIVE

## 2019-09-29 MED ORDER — FUROSEMIDE 10 MG/ML IJ SOLN
20.0000 mg | Freq: Once | INTRAMUSCULAR | Status: AC
  Administered 2019-09-29: 20 mg via INTRAVENOUS
  Filled 2019-09-29: qty 2

## 2019-09-29 MED ORDER — SODIUM CHLORIDE 0.9% IV SOLUTION: Freq: Once | INTRAVENOUS | Status: DC

## 2019-09-29 MED ORDER — PENICILLIN V POTASSIUM 250 MG PO TABS
500.0000 mg | ORAL_TABLET | Freq: Four times a day (QID) | ORAL | Status: DC
  Administered 2019-10-03: 500 mg via ORAL
  Filled 2019-09-29 (×2): qty 2

## 2019-09-29 MED ORDER — DARBEPOETIN ALFA 300 MCG/0.6ML IJ SOSY
300.0000 ug | PREFILLED_SYRINGE | Freq: Once | INTRAMUSCULAR | Status: AC
  Administered 2019-09-29: 300 ug via SUBCUTANEOUS
  Filled 2019-09-29: qty 0.6

## 2019-09-29 MED ORDER — AZITHROMYCIN 250 MG PO TABS
500.0000 mg | ORAL_TABLET | Freq: Every day | ORAL | Status: AC
  Administered 2019-09-29 – 2019-10-02 (×4): 500 mg via ORAL
  Filled 2019-09-29 (×4): qty 2

## 2019-09-29 NOTE — Progress Notes (Signed)
Per Dr. Marin Olp, patient only needs 1 unit of PRBCs today (09/29/19). This unit is currently running now.

## 2019-09-29 NOTE — Progress Notes (Signed)
According to blood bank, it will be approximately 2-3pm before patient is able to receive unit of blood. The blood is coming from [place] per blood bank. Will administer blood when available.

## 2019-09-29 NOTE — Progress Notes (Signed)
Patient currently off BiPAP and no distress noted.

## 2019-09-29 NOTE — Progress Notes (Signed)
[facility]  PROGRESS NOTE  Christian Burns KZL:[id number] DOB: January 16, 1930 DOA: 09/28/2019 PCP: Leanna Battles, MD   Brief HPI:   86 year old male with history of multiple myeloma status post chemo therapy, blood transfusions, last transfusion was last week, essential hypertension, chronic osteomyelitis of the jaw, stage III CKD, history of stroke in the past came to ED with generalized weakness, fever, cough and shortness of breath.  Covid 19 test was negative.    Subjective   Patient seen and examined, denies any complaints.  Denies chest pain or shortness of breath.   Assessment/Plan:     1. Acute respiratory failure with hypoxia-secondary to multifocal pneumonia, patient started on vancomycin, ceftriaxone, Zithromax.  MRSA PCR is negative, will discontinue vancomycin.  Patient is still requiring 4 L/min of oxygen. 2. Symptomatic anemia-patient presented with hemoglobin of 6.9, 2 units PRBC have been ordered.  Patient only received 1 unit yesterday.  Will follow CBC in a.m. 3. Hypertension-patient is currently on amlodipine, benazepril is on hold due to worsening renal function.  Blood pressure is stable. 4. CKD stage III-patient baseline creatinine around 1.6 as of August 31, 2019.  He presented with creatinine of 1.86, creatinine has now worsened to 2.12.  He did receive 1 dose of Lasix 40 mg IV yesterday.  Will closely monitor patient's creatinine in the hospital. 5. Pancytopenia-likely from chemotherapy.  Follow CBC in a.m.  Dr. Marin Olp is following. 6. Multiple myeloma-patient followed by Dr. Marin Olp.  Dr. Marin Olp is following patient in the hospital. 7. History of stroke-continue aspirin 8. Chronic osteomyelitis of the jaw-patient is on suppressive therapy with penicillin.  Will hold penicillin as patient is currently on ceftriaxone and Zithromax as above.    SpO2: 100 % O2 Flow Rate (L/min): 4 L/min   COVID-19 Labs  No results for input(s): DDIMER, FERRITIN, LDH, CRP  in the last 72 hours.  Lab Results  Component Value Date   SARSCOV2NAA NEGATIVE 09/28/2019   SARSCOV2NAA RESULT:  NEGATIVE 09/15/2019   SARSCOV2NAA NEGATIVE 06/22/2019   [place] NEGATIVE 04/19/2019     CBG: No results for input(s): GLUCAP in the last 168 hours.  CBC: Recent Labs  Lab 09/28/19 0335 09/29/19 0223  WBC 2.7*  --   NEUTROABS 2.3  --   HGB 6.9* 8.6*  HCT 20.8* 26.7*  MCV 97.2  --   PLT 105*  --     Basic Metabolic Panel: Recent Labs  Lab 09/28/19 0335 09/29/19 0148  NA 137  --   K 3.6  --   CL 105  --   CO2 22  --   GLUCOSE 117*  --   BUN 32*  --   CREATININE 1.84* 2.12*  CALCIUM 9.0  --      Liver Function Tests: Recent Labs  Lab 09/28/19 0335  AST 46*  ALT 17  ALKPHOS 43  BILITOT 1.2  PROT 4.8*  ALBUMIN 2.1*        DVT prophylaxis: Lovenox  Code Status: Full code  Family Communication: No family at bedside  Disposition Plan: likely home when medically ready for discharge         Scheduled medications:  . sodium chloride   Intravenous Once  . amLODipine  5 mg Oral Daily  . azithromycin  500 mg Oral Daily  . Chlorhexidine Gluconate Cloth  6 each Topical Daily  . dronabinol  5 mg Oral QAC lunch  . enoxaparin (LOVENOX) injection  30 mg Subcutaneous Q24H  . finasteride  5 mg  Oral Daily  . fluconazole  200 mg Oral Daily  . pantoprazole  40 mg Oral BID  . [START ON 10/03/2019] penicillin v potassium  500 mg Oral Q6H  . predniSONE  20 mg Oral Q breakfast  . pyridOXINE  100 mg Oral Daily  . [START ON 10/03/2019] Vitamin D (Ergocalciferol)  50,000 Units Oral Q Sun    Consultants:  Oncology  Procedures:    Antibiotics:   Anti-infectives (From admission, onward)   Start     Dose/Rate Route Frequency Ordered Stop   10/03/19 0600  penicillin v potassium (VEETID) tablet 500 mg     500 mg Oral Every 6 hours 09/29/19 0836     09/30/19 0600  vancomycin (VANCOREADY) IVPB 1250 mg/250 mL  Status:  Discontinued     1,250  mg 166.7 mL/hr over 90 Minutes Intravenous Every 48 hours 09/28/19 0722 09/29/19 1008   09/29/19 1000  azithromycin (ZITHROMAX) tablet 500 mg     500 mg Oral Daily 09/29/19 0845 10/03/19 0959   09/28/19 1600  azithromycin (ZITHROMAX) 500 mg in sodium chloride 0.9 % 250 mL IVPB  Status:  Discontinued     500 mg 250 mL/hr over 60 Minutes Intravenous Every 24 hours 09/28/19 1454 09/29/19 0845   09/28/19 1200  fluconazole (DIFLUCAN) tablet 200 mg    Note to Pharmacy: Take 400 mg on day one, then 200 mg daily for three weeks.     200 mg Oral Daily 09/28/19 0701     09/28/19 1200  cefTRIAXone (ROCEPHIN) 2 g in sodium chloride 0.9 % 100 mL IVPB     2 g 200 mL/hr over 30 Minutes Intravenous Every 24 hours 09/28/19 0703 10/03/19 1159   09/28/19 0800  azithromycin (ZITHROMAX) 500 mg in sodium chloride 0.9 % 250 mL IVPB  Status:  Discontinued     500 mg 250 mL/hr over 60 Minutes Intravenous Every 24 hours 09/28/19 0703 09/28/19 1454   09/28/19 0715  penicillin v potassium (VEETID) tablet 500 mg  Status:  Discontinued     500 mg Oral Every 6 hours 09/28/19 0704 09/29/19 0816   09/28/19 0400  vancomycin (VANCOREADY) IVPB 1500 mg/300 mL     1,500 mg 150 mL/hr over 120 Minutes Intravenous  Once 09/28/19 0326 09/28/19 1150   09/28/19 0330  ceFEPIme (MAXIPIME) 2 g in sodium chloride 0.9 % 100 mL IVPB     2 g 200 mL/hr over 30 Minutes Intravenous  Once 09/28/19 0319 09/28/19 0520   09/28/19 0330  metroNIDAZOLE (FLAGYL) IVPB 500 mg     500 mg 100 mL/hr over 60 Minutes Intravenous  Once 09/28/19 0319 09/28/19 0546   09/28/19 0330  vancomycin (VANCOCIN) IVPB 1000 mg/200 mL premix  Status:  Discontinued     1,000 mg 200 mL/hr over 60 Minutes Intravenous  Once 09/28/19 0319 09/28/19 0326       Objective   Vitals:   09/29/19 0800 09/29/19 0900 09/29/19 1000 09/29/19 1100  BP: (!) 147/74 140/68 (!) 159/77 (!) 143/73  Pulse: 84 77 73 77  Resp: (!) 24 (!) 23 20 20   Temp: (!) 97.5 F (36.4 C)      TempSrc: Oral     SpO2: 99% 99% 100% 100%  Weight:      Height:        Intake/Output Summary (Last 24 hours) at 09/29/2019 1111 Last data filed at 09/28/2019 2230 Gross per 24 hour  Intake 802.47 ml  Output 850 ml  Net -47.53 ml  02/15 1901 - 02/17 0700 In: 1022.5 [P.O.:320] Out: 850 [Urine:850]  Filed Weights   09/28/19 0314  Weight: 63.5 kg    Physical Examination:    General-appears in no acute distress  Heart-S1-S2, regular, no murmur auscultated  Lungs-bibasilar crackles auscultated.  Abdomen-soft, nontender, no organomegaly  Extremities-no edema in the lower extremities  Neuro-alert, oriented x3, no focal deficit noted    Data Reviewed:   Recent Results (from the past 240 hour(s))  Blood Culture (routine x 2)     Status: None (Preliminary result)   Collection Time: 09/28/19  4:00 AM   Specimen: BLOOD  Result Value Ref Range Status   Specimen Description   Final    BLOOD BLOOD LEFT HAND Performed at [facility] [address] [place], [place] [address]    Special Requests   Final    BOTTLES DRAWN AEROBIC AND ANAEROBIC Blood Culture results may not be optimal due to an excessive volume of blood received in culture bottles Performed at [facility] [address], [place], [place] [address]    Culture   Final    NO GROWTH < 24 HOURS Performed at [facility] [address] [place], [place] [address]    Report Status PENDING  Incomplete  Blood Culture (routine x 2)     Status: None (Preliminary result)   Collection Time: 09/28/19  4:00 AM   Specimen: BLOOD  Result Value Ref Range Status   Specimen Description   Final    BLOOD LEFT ANTECUBITAL Performed at [facility] [address], [place], [place] [address]    Special Requests   Final    BOTTLES DRAWN AEROBIC AND ANAEROBIC Blood Culture adequate volume Performed at [facility] [address],  [place], [place] [address]    Culture   Final    NO GROWTH < 24 HOURS Performed at [facility] [address] [place], [place] [address]    Report Status PENDING  Incomplete  SARS CORONAVIRUS 2 (TAT 6-24 HRS) Nasopharyngeal Nasopharyngeal Swab     Status: None   Collection Time: 09/28/19  5:00 AM   Specimen: Nasopharyngeal Swab  Result Value Ref Range Status   SARS Coronavirus 2 NEGATIVE NEGATIVE Final    Comment: (NOTE) SARS-CoV-2 target nucleic acids are NOT DETECTED. The SARS-CoV-2 RNA is generally detectable in upper and lower respiratory specimens during the acute phase of infection. Negative results do not preclude SARS-CoV-2 infection, do not rule out co-infections with other pathogens, and should not be used as the sole basis for treatment or other patient management decisions. Negative results must be combined with clinical observations, patient history, and epidemiological information. The expected result is Negative. Fact Sheet for Patients: SugarRoll.be Fact Sheet for Healthcare Providers: https://www.woods-mathews.com/ This test is not yet approved or cleared by the [place] FDA and  has been authorized for detection and/or diagnosis of SARS-CoV-2 by FDA under an Emergency Use Authorization (EUA). This EUA will remain  in effect (meaning this test can be used) for the duration of the COVID-19 declaration under Section 56 4(b)(1) of the Act, 21 U.S.C. section 360bbb-3(b)(1), unless the authorization is terminated or revoked sooner. Performed at [facility], [address] [place],  [address]   MRSA PCR Screening     Status: None   Collection Time: 09/28/19  4:12 PM   Specimen: Nasal Mucosa; Nasopharyngeal  Result Value Ref Range Status   MRSA  by PCR NEGATIVE NEGATIVE Final    Comment:        The GeneXpert MRSA Assay (FDA approved for NASAL specimens only), is one component of a comprehensive MRSA  colonization surveillance program. It is not intended to diagnose MRSA infection nor to guide or monitor treatment for MRSA infections. Performed at [facility], [place] [address] [place], [place] [address]     No results for input(s): LIPASE, AMYLASE in the last 168 hours. No results for input(s): AMMONIA in the last 168 hours.  Cardiac Enzymes: No results for input(s): CKTOTAL, CKMB, CKMBINDEX, TROPONINI in the last 168 hours. BNP (last 3 results) Recent Labs    10/06/18 2142  BNP 109.5*    ProBNP (last 3 results) No results for input(s): PROBNP in the last 8760 hours.  Studies:  DG CHEST PORT 1 VIEW  Result Date: 09/28/2019 CLINICAL DATA:  Hypoxia. EXAM: PORTABLE CHEST 1 VIEW COMPARISON:  Chest radiograph 09/28/2019 FINDINGS: Redemonstrated implanted loop recorder device which projects in the region of the left chest wall. Heart size within normal limits. Redemonstrated multifocal bilateral airspace disease most prominent within the right lung apex. Findings is not significant changed since prior examination performed earlier the same day. No evidence of pleural effusion or pneumothorax. No acute bony abnormality. Thoracic spondylosis. Overlying cardiac monitoring leads. IMPRESSION: Bilateral multifocal airspace disease most prominent within the right lung apex, not significantly changed since chest radiograph performed earlier the same day. Findings likely reflect multifocal pneumonia. Edema is also a consideration. Electronically Signed   By: Kellie Simmering DO   On: 09/28/2019 12:44   DG Chest Port 1 View  Result Date: 09/28/2019 CLINICAL DATA:  Fever and weakness. EXAM: PORTABLE CHEST 1 VIEW COMPARISON:  06/22/2019 FINDINGS: Implanted loop recorder projects over the left chest wall. Multifocal interstitial and airspace opacities throughout both lungs, right greater than left. Unchanged heart size and mediastinal contours. No pleural effusion or pneumothorax.  Degenerative change in the spine. IMPRESSION: Bilateral interstitial and airspace opacities throughout both lungs, right greater than left, suspicious for multifocal pneumonia. Asymmetric pulmonary edema is felt less likely. Electronically Signed   By: Keith Rake M.D.   On: 09/28/2019 03:51     Admission status: Inpatient: Based on patients clinical presentation and evaluation of above clinical data, I have made determination that patient meets Inpatient criteria at this time.   Oswald Hillock   [facility] If 7PM-7AM, please contact night-coverage at [email or web address], Office  [phone]  password [email or web address]  09/29/2019, 11:11 AM  LOS: 1 day

## 2019-09-29 NOTE — Progress Notes (Signed)
PHARMACIST - PHYSICIAN COMMUNICATION  CONCERNING: Antibiotic IV to Oral Route Change Policy  RECOMMENDATION: This patient is receiving azithromycin by the intravenous route.  Based on criteria approved by the Pharmacy and Therapeutics Committee, the antibiotic(s) is/are being converted to the equivalent oral dose form(s).   DESCRIPTION: These criteria include:  Patient being treated for a respiratory tract infection, urinary tract infection, cellulitis or clostridium difficile associated diarrhea if on metronidazole  The patient is not neutropenic and does not exhibit a GI malabsorption state  The patient is eating (either orally or via tube) and/or has been taking other orally administered medications for a least 24 hours  The patient is improving clinically and has a Tmax < 100.5  If you have questions about this conversion, please contact the Pharmacy Department  []  ( [phone] )  [facility] []  ( [phone] )  [facility] []  ( [phone]  [facility] []  ( [phone] )  [facility] [x]  ( [phone] )  [facility]  

## 2019-09-30 ENCOUNTER — Inpatient Hospital Stay: Payer: Medicare HMO

## 2019-09-30 ENCOUNTER — Inpatient Hospital Stay (HOSPITAL_COMMUNITY): Payer: Medicare HMO

## 2019-09-30 LAB — TYPE AND SCREEN
ABO/RH(D): O POS
Antibody Screen: NEGATIVE
Unit division: 0
Unit division: 0

## 2019-09-30 LAB — COMPREHENSIVE METABOLIC PANEL
ALT: 18 U/L (ref 0–44)
AST: 37 U/L (ref 15–41)
Albumin: 2 g/dL — ABNORMAL LOW (ref 3.5–5.0)
Alkaline Phosphatase: 47 U/L (ref 38–126)
Anion gap: 10 (ref 5–15)
BUN: 40 mg/dL — ABNORMAL HIGH (ref 8–23)
CO2: 23 mmol/L (ref 22–32)
Calcium: 8.6 mg/dL — ABNORMAL LOW (ref 8.9–10.3)
Chloride: 104 mmol/L (ref 98–111)
Creatinine, Ser: 2.03 mg/dL — ABNORMAL HIGH (ref 0.61–1.24)
GFR calc Af Amer: 33 mL/min — ABNORMAL LOW (ref >60)
GFR calc non Af Amer: 29 mL/min — ABNORMAL LOW (ref >60)
Glucose, Bld: 114 mg/dL — ABNORMAL HIGH (ref 70–99)
Potassium: 3.4 mmol/L — ABNORMAL LOW (ref 3.5–5.1)
Sodium: 137 mmol/L (ref 135–145)
Total Bilirubin: 0.4 mg/dL (ref 0.3–1.2)
Total Protein: 4.9 g/dL — ABNORMAL LOW (ref 6.5–8.1)

## 2019-09-30 LAB — BPAM RBC
Blood Product Expiration Date: 202103222359
Blood Product Expiration Date: 202103272359
ISSUE DATE / TIME: 202102162018
ISSUE DATE / TIME: 202102171620
Unit Type and Rh: 5100
Unit Type and Rh: 5100

## 2019-09-30 LAB — CBC
HCT: 30.1 % — ABNORMAL LOW (ref 39.0–52.0)
Hemoglobin: 10.1 g/dL — ABNORMAL LOW (ref 13.0–17.0)
MCH: 31.4 pg (ref 26.0–34.0)
MCHC: 33.6 g/dL (ref 30.0–36.0)
MCV: 93.5 fL (ref 80.0–100.0)
Platelets: 110 10*3/uL — ABNORMAL LOW (ref 150–400)
RBC: 3.22 MIL/uL — ABNORMAL LOW (ref 4.22–5.81)
RDW: 15.5 % (ref 11.5–15.5)
WBC: 4.1 10*3/uL (ref 4.0–10.5)
nRBC: 0 % (ref 0.0–0.2)

## 2019-09-30 LAB — IGG, IGA, IGM
IgA: 8 mg/dL — ABNORMAL LOW (ref 61–437)
IgG (Immunoglobin G), Serum: 628 mg/dL (ref 603–1613)
IgM (Immunoglobulin M), Srm: 8 mg/dL — ABNORMAL LOW (ref 15–143)

## 2019-09-30 LAB — ERYTHROPOIETIN: Erythropoietin: 15.7 m[IU]/mL (ref 2.6–18.5)

## 2019-09-30 MED ORDER — DRONABINOL 2.5 MG PO CAPS: 5.0000 mg | ORAL_CAPSULE | Freq: Two times a day (BID) | ORAL | Status: DC

## 2019-09-30 MED ORDER — DRONABINOL 5 MG PO CAPS
5.0000 mg | ORAL_CAPSULE | Freq: Two times a day (BID) | ORAL | Status: DC
  Administered 2019-09-30 – 2019-10-04 (×9): 5 mg via ORAL
  Filled 2019-09-30 (×9): qty 1

## 2019-09-30 MED ORDER — ORAL CARE MOUTH RINSE
15.0000 mL | Freq: Two times a day (BID) | OROMUCOSAL | Status: DC
  Administered 2019-09-30 – 2019-10-03 (×8): 15 mL via OROMUCOSAL

## 2019-09-30 MED ORDER — DARBEPOETIN ALFA 300 MCG/0.6ML IJ SOSY: 300.0000 ug | PREFILLED_SYRINGE | Freq: Once | INTRAMUSCULAR | Status: DC

## 2019-09-30 MED ORDER — POTASSIUM CHLORIDE CRYS ER 20 MEQ PO TBCR
20.0000 meq | EXTENDED_RELEASE_TABLET | Freq: Two times a day (BID) | ORAL | Status: AC
  Administered 2019-09-30 (×2): 20 meq via ORAL
  Filled 2019-09-30 (×2): qty 1

## 2019-09-30 MED ORDER — METRONIDAZOLE IN NACL 5-0.79 MG/ML-% IV SOLN
500.0000 mg | Freq: Once | INTRAVENOUS | Status: AC
  Administered 2019-10-01: 500 mg via INTRAVENOUS
  Filled 2019-09-30: qty 100

## 2019-09-30 NOTE — Progress Notes (Signed)
PHARMACY - PHYSICIAN COMMUNICATION CRITICAL VALUE ALERT - BLOOD CULTURE IDENTIFICATION (BCID)  Christian Burns is an 86 y.o. male who presented to [facility] on 09/28/2019 with a chief complaint of multifocal pna.   Assessment:  Pna (include suspected source if known) 1 of 4 GPR- likely contaminant- micro thinks may not be contaminant.   Name of physician (or Provider) Contacted: Bodenheimer, NP  Current antibiotics: roc/zmax  Changes to prescribed antibiotics recommended:  Give one time dose Flagyl 500 mg x1, have dayshift f/u  No results found. However, due to the size of the patient record, not all encounters were searched. Please check Results Review for a complete set of results.  Dorrene German 09/30/2019  11:22 PM

## 2019-09-30 NOTE — Progress Notes (Signed)
[facility]  PROGRESS NOTE  Christian Burns NOB:[id number] DOB: 1930-02-16 DOA: 09/28/2019 PCP: Leanna Battles, MD   Brief HPI:   86 year old male with history of multiple myeloma status post chemo therapy, blood transfusions, last transfusion was last week, essential hypertension, chronic osteomyelitis of the jaw, stage III CKD, history of stroke in the past came to ED with generalized weakness, fever, cough and shortness of breath.  Covid 19 test was negative.    Subjective   Patient seen and examined, denies any complaints.  Breathing has improved.  He did receive 2 units of PRBC, hemoglobin is up to 10.1.   Assessment/Plan:     1. Acute respiratory failure with hypoxia-secondary to multifocal pneumonia, patient started on vancomycin, ceftriaxone, Zithromax.  MRSA PCR is negative, vancomycin was discontinued.  Patient is still requiring 4 L/min of oxygen.  Repeat chest x-ray shows patchy bilateral airspace disease. 2. Symptomatic anemia-patient presented with hemoglobin of 6.9, s/p 2 units PRBC.  Hemoglobin is up to 10.1.  3. Hypertension-patient is currently on amlodipine, benazepril is on hold due to worsening renal function.  Blood pressure is stable. 4. CKD stage III-patient baseline creatinine around 1.6 as of August 31, 2019.  He presented with creatinine of 1.86, creatinine worsened to 2.12, after he received 1 dose of Lasix.  Creatinine is down to 2.03 today.  Follow BMP in a.m.   5. Pancytopenia-likely from chemotherapy.  Follow CBC in a.m.  Dr. Marin Olp is following. 6. Multiple myeloma-patient followed by Dr. Marin Olp.  Dr. Marin Olp is following patient in the hospital. 7. History of stroke-continue aspirin 8. Chronic osteomyelitis of the jaw-patient is on suppressive therapy with penicillin.  Penicillin is on hold as patient is  currently on ceftriaxone and Zithromax as above.    SpO2: 97 % O2 Flow Rate (L/min): 4 L/min   COVID-19 Labs  No results for input(s):  DDIMER, FERRITIN, LDH, CRP in the last 72 hours.  Lab Results  Component Value Date   SARSCOV2NAA NEGATIVE 09/28/2019   SARSCOV2NAA RESULT:  NEGATIVE 09/15/2019   SARSCOV2NAA NEGATIVE 06/22/2019   [place] NEGATIVE 04/19/2019     CBG: No results for input(s): GLUCAP in the last 168 hours.  CBC: Recent Labs  Lab 09/28/19 0335 09/29/19 0223 09/30/19 0217  WBC 2.7*  --  4.1  NEUTROABS 2.3  --   --   HGB 6.9* 8.6* 10.1*  HCT 20.8* 26.7* 30.1*  MCV 97.2  --  93.5  PLT 105*  --  110*    Basic Metabolic Panel: Recent Labs  Lab 09/28/19 0335 09/29/19 0148 09/30/19 0217  NA 137  --  137  K 3.6  --  3.4*  CL 105  --  104  CO2 22  --  23  GLUCOSE 117*  --  114*  BUN 32*  --  40*  CREATININE 1.84* 2.12* 2.03*  CALCIUM 9.0  --  8.6*     Liver Function Tests: Recent Labs  Lab 09/28/19 0335 09/30/19 0217  AST 46* 37  ALT 17 18  ALKPHOS 43 47  BILITOT 1.2 0.4  PROT 4.8* 4.9*  ALBUMIN 2.1* 2.0*        DVT prophylaxis: Lovenox  Code Status: Full code  Family Communication: No family at bedside  Disposition Plan: We will get PT evaluation, patient is still requiring 4 L/min of oxygen.  Chest x-ray still shows bilateral infiltrates.  Likely will need skilled nursing facility for rehab.         Scheduled  medications:  . amLODipine  5 mg Oral Daily  . azithromycin  500 mg Oral Daily  . Chlorhexidine Gluconate Cloth  6 each Topical Daily  . dronabinol  5 mg Oral BID AC  . enoxaparin (LOVENOX) injection  30 mg Subcutaneous Q24H  . finasteride  5 mg Oral Daily  . fluconazole  200 mg Oral Daily  . mouth rinse  15 mL Mouth Rinse BID  . pantoprazole  40 mg Oral BID  . [START ON 10/03/2019] penicillin v potassium  500 mg Oral Q6H  . potassium chloride  20 mEq Oral BID  . predniSONE  20 mg Oral Q breakfast  . pyridOXINE  100 mg Oral Daily  . [START ON 10/03/2019] Vitamin D (Ergocalciferol)  50,000 Units Oral Q Sun     Consultants:  Oncology  Procedures:    Antibiotics:   Anti-infectives (From admission, onward)   Start     Dose/Rate Route Frequency Ordered Stop   10/03/19 0600  penicillin v potassium (VEETID) tablet 500 mg     500 mg Oral Every 6 hours 09/29/19 0836     09/30/19 0600  vancomycin (VANCOREADY) IVPB 1250 mg/250 mL  Status:  Discontinued     1,250 mg 166.7 mL/hr over 90 Minutes Intravenous Every 48 hours 09/28/19 0722 09/29/19 1008   09/29/19 1000  azithromycin (ZITHROMAX) tablet 500 mg     500 mg Oral Daily 09/29/19 0845 10/03/19 0959   09/28/19 1600  azithromycin (ZITHROMAX) 500 mg in sodium chloride 0.9 % 250 mL IVPB  Status:  Discontinued     500 mg 250 mL/hr over 60 Minutes Intravenous Every 24 hours 09/28/19 1454 09/29/19 0845   09/28/19 1200  fluconazole (DIFLUCAN) tablet 200 mg    Note to Pharmacy: Take 400 mg on day one, then 200 mg daily for three weeks.     200 mg Oral Daily 09/28/19 0701     09/28/19 1200  cefTRIAXone (ROCEPHIN) 2 g in sodium chloride 0.9 % 100 mL IVPB     2 g 200 mL/hr over 30 Minutes Intravenous Every 24 hours 09/28/19 0703 10/03/19 1159   09/28/19 0800  azithromycin (ZITHROMAX) 500 mg in sodium chloride 0.9 % 250 mL IVPB  Status:  Discontinued     500 mg 250 mL/hr over 60 Minutes Intravenous Every 24 hours 09/28/19 0703 09/28/19 1454   09/28/19 0715  penicillin v potassium (VEETID) tablet 500 mg  Status:  Discontinued     500 mg Oral Every 6 hours 09/28/19 0704 09/29/19 0816   09/28/19 0400  vancomycin (VANCOREADY) IVPB 1500 mg/300 mL     1,500 mg 150 mL/hr over 120 Minutes Intravenous  Once 09/28/19 0326 09/28/19 1150   09/28/19 0330  ceFEPIme (MAXIPIME) 2 g in sodium chloride 0.9 % 100 mL IVPB     2 g 200 mL/hr over 30 Minutes Intravenous  Once 09/28/19 0319 09/28/19 0520   09/28/19 0330  metroNIDAZOLE (FLAGYL) IVPB 500 mg     500 mg 100 mL/hr over 60 Minutes Intravenous  Once 09/28/19 0319 09/28/19 0546   09/28/19 0330  vancomycin  (VANCOCIN) IVPB 1000 mg/200 mL premix  Status:  Discontinued     1,000 mg 200 mL/hr over 60 Minutes Intravenous  Once 09/28/19 0319 09/28/19 0326       Objective   Vitals:   09/30/19 0900 09/30/19 1000 09/30/19 1100 09/30/19 1200  BP: (!) 166/73 (!) 165/90 (!) 145/77 139/74  Pulse: 88 86 88 80  Resp: (!) 23 19 (!)  22 (!) 22  Temp:      TempSrc:      SpO2: 96% 97% 97% 97%  Weight:      Height:        Intake/Output Summary (Last 24 hours) at 09/30/2019 1323 Last data filed at 09/30/2019 1247 Gross per 24 hour  Intake 367.73 ml  Output 1625 ml  Net -1257.27 ml    02/16 1901 - 02/18 0700 In: 772.7 [P.O.:440] Out: 1625 [Urine:1625]  Filed Weights   09/28/19 0314  Weight: 63.5 kg    Physical Examination:   General-appears in no acute distress Heart-S1-S2, regular, no murmur auscultated Lungs-clear to auscultation bilaterally, no wheezing or crackles auscultated Abdomen-soft, nontender, no organomegaly Extremities-no edema in the lower extremities Neuro-alert, oriented x3, no focal deficit noted   Data Reviewed:   Recent Results (from the past 240 hour(s))  Blood Culture (routine x 2)     Status: None (Preliminary result)   Collection Time: 09/28/19  4:00 AM   Specimen: BLOOD  Result Value Ref Range Status   Specimen Description   Final    BLOOD BLOOD LEFT HAND Performed at [facility] [address] [place], [place] [address]    Special Requests   Final    BOTTLES DRAWN AEROBIC AND ANAEROBIC Blood Culture results may not be optimal due to an excessive volume of blood received in culture bottles Performed at [facility] [address], [place], [place] [address]    Culture   Final    NO GROWTH 2 DAYS Performed at [facility] [address], [place], [place] [address]    Report Status PENDING  Incomplete  Blood Culture (routine x 2)     Status: None (Preliminary result)   Collection Time: 09/28/19  4:00 AM    Specimen: BLOOD  Result Value Ref Range Status   Specimen Description   Final    BLOOD LEFT ANTECUBITAL Performed at [facility] [address], [place], [place] [address]    Special Requests   Final    BOTTLES DRAWN AEROBIC AND ANAEROBIC Blood Culture adequate volume Performed at [facility] [address], [place], [place] [address]    Culture   Final    NO GROWTH 2 DAYS Performed at [facility] [address], [place], [place] [address]    Report Status PENDING  Incomplete  SARS CORONAVIRUS 2 (TAT 6-24 HRS) Nasopharyngeal Nasopharyngeal Swab     Status: None   Collection Time: 09/28/19  5:00 AM   Specimen: Nasopharyngeal Swab  Result Value Ref Range Status   SARS Coronavirus 2 NEGATIVE NEGATIVE Final    Comment: (NOTE) SARS-CoV-2 target nucleic acids are NOT DETECTED. The SARS-CoV-2 RNA is generally detectable in upper and lower respiratory specimens during the acute phase of infection. Negative results do not preclude SARS-CoV-2 infection, do not rule out co-infections with other pathogens, and should not be used as the sole basis for treatment or other patient management decisions. Negative results must be combined with clinical observations, patient history, and epidemiological information. The expected result is Negative. Fact Sheet for Patients: SugarRoll.be Fact Sheet for Healthcare Providers: https://www.woods-mathews.com/ This test is not yet approved or cleared by the [place] FDA and  has been authorized for detection and/or diagnosis of SARS-CoV-2 by FDA under an Emergency Use Authorization (EUA). This EUA will remain  in effect (meaning this test can be used) for the duration of the COVID-19 declaration  under Section 56 4(b)(1) of the Act, 21 U.S.C. section 360bbb-3(b)(1), unless the authorization is terminated or revoked sooner. Performed at [facility], [place] [address], [place], [place] [address]   MRSA PCR Screening     Status: None   Collection Time: 09/28/19  4:12 PM   Specimen: Nasal Mucosa; Nasopharyngeal  Result Value Ref Range Status   MRSA by PCR NEGATIVE NEGATIVE Final    Comment:        The GeneXpert MRSA Assay (FDA approved for NASAL specimens only), is one component of a comprehensive MRSA colonization surveillance program. It is not intended to diagnose MRSA infection nor to guide or monitor treatment for MRSA infections. Performed at [facility], [place] [address] [place], [place] [address]     No results for input(s): LIPASE, AMYLASE in the last 168 hours. No results for input(s): AMMONIA in the last 168 hours.  Cardiac Enzymes: No results for input(s): CKTOTAL, CKMB, CKMBINDEX, TROPONINI in the last 168 hours. BNP (last 3 results) Recent Labs    10/06/18 2142  BNP 109.5*    ProBNP (last 3 results) No results for input(s): PROBNP in the last 8760 hours.  Studies:  DG CHEST PORT 1 VIEW  Result Date: 09/30/2019 CLINICAL DATA:  Pneumonia EXAM: PORTABLE CHEST 1 VIEW COMPARISON:  09/28/2019 FINDINGS: Multifocal bilateral airspace opacities again noted, stable since prior study. Heart is normal size. Mild hyperinflation of the lungs. No visible effusions or pneumothorax. No acute bony abnormality. IMPRESSION: Patchy bilateral airspace disease, unchanged since prior study. Electronically Signed   By: Rolm Baptise M.D.   On: 09/30/2019 10:22     Admission status: Inpatient: Based on patients clinical presentation and evaluation of above clinical data, I have made determination that patient meets Inpatient criteria at this time.   Oswald Hillock   [facility] If 7PM-7AM, please contact night-coverage at [email or web address], Office  [phone]  password [email or web address]  09/30/2019, 1:23 PM  LOS: 2 days

## 2019-09-30 NOTE — Progress Notes (Signed)
Mr. Goldbeck seems to be getting better.  His "color" looks better.  I think the blood transfusions definitely helped.  His erythropoietin level is only 16 so I will give him a dose of Aranesp to try to help get his bone marrow little bit more active.  His IgG level is 628 mg/dL.  This really is not all that bad.  His cultures are negative so far.  I probably would repeat a chest x-ray to see how that looks.  His oxygen saturation is 94%.  There is been no problems with fever.  He has had no nausea or vomiting.  His appetite seems to be doing a little bit better.  I probably would have him on the Marinol twice a day dosing.  His electrolytes look fairly good.  His BUN is 40 creatinine 2.03.  This is up a little bit for him.  It would be nice to try to get him out of bed.  He might need some physical therapy.  His vital signs all look stable.  Blood pressure clearly is not low.  His blood pressure is 166/73.  His temperature is 97.6.  His lungs sound like he has decent air movement bilaterally.  Cardiac exam regular rate and rhythm.  Hopefully, Mr. Suppes will be able to move out of the ICU.  He seems to be improving.  I am sure that he is weak.  I am sure that physical therapy by would help with some of his strength recovery.  I am grateful for all the outstanding care that he is getting by everybody in the ICU.  Lattie Haw, MD  John 13:34

## 2019-10-01 LAB — CBC
HCT: 30.3 % — ABNORMAL LOW (ref 39.0–52.0)
Hemoglobin: 9.8 g/dL — ABNORMAL LOW (ref 13.0–17.0)
MCH: 30.3 pg (ref 26.0–34.0)
MCHC: 32.3 g/dL (ref 30.0–36.0)
MCV: 93.8 fL (ref 80.0–100.0)
Platelets: 108 10*3/uL — ABNORMAL LOW (ref 150–400)
RBC: 3.23 MIL/uL — ABNORMAL LOW (ref 4.22–5.81)
RDW: 15.1 % (ref 11.5–15.5)
WBC: 3.2 10*3/uL — ABNORMAL LOW (ref 4.0–10.5)
nRBC: 0 % (ref 0.0–0.2)

## 2019-10-01 LAB — COMPREHENSIVE METABOLIC PANEL
ALT: 17 U/L (ref 0–44)
AST: 31 U/L (ref 15–41)
Albumin: 1.9 g/dL — ABNORMAL LOW (ref 3.5–5.0)
Alkaline Phosphatase: 49 U/L (ref 38–126)
Anion gap: 8 (ref 5–15)
BUN: 37 mg/dL — ABNORMAL HIGH (ref 8–23)
CO2: 24 mmol/L (ref 22–32)
Calcium: 8.7 mg/dL — ABNORMAL LOW (ref 8.9–10.3)
Chloride: 106 mmol/L (ref 98–111)
Creatinine, Ser: 1.72 mg/dL — ABNORMAL HIGH (ref 0.61–1.24)
GFR calc Af Amer: 41 mL/min — ABNORMAL LOW (ref >60)
GFR calc non Af Amer: 35 mL/min — ABNORMAL LOW (ref >60)
Glucose, Bld: 98 mg/dL (ref 70–99)
Potassium: 3.9 mmol/L (ref 3.5–5.1)
Sodium: 138 mmol/L (ref 135–145)
Total Bilirubin: 0.7 mg/dL (ref 0.3–1.2)
Total Protein: 4.7 g/dL — ABNORMAL LOW (ref 6.5–8.1)

## 2019-10-01 NOTE — Progress Notes (Signed)
HEMATOLOGY-ONCOLOGY PROGRESS NOTE  SUBJECTIVE: Reports that his breathing is better this morning.  He will be transferring out of the stepdown/ICU later today.  Still has a nonproductive cough.  Denies bleeding.  He has no other complaints this morning.  Oncology History  Myeloma ([place])  08/23/2011 Initial Diagnosis   Myeloma ([place])   10/29/2018 - 12/23/2018 Chemotherapy   The patient had elotuzumab (EMPLICITI) 784 mg in sodium chloride 0.9 % 230 mL chemo infusion, 10 mg/kg = 700 mg, Intravenous, Once, 2 of 5 cycles Administration: 700 mg (10/29/2018), 700 mg (11/05/2018), 700 mg (11/12/2018), 700 mg (11/19/2018), 700 mg (11/26/2018), 700 mg (12/03/2018), 700 mg (12/10/2018), 700 mg (12/17/2018)  for chemotherapy treatment.    02/10/2019 -  Chemotherapy   The patient had palonosetron (ALOXI) injection 0.25 mg, 0.25 mg, Intravenous,  Once, 8 of 10 cycles Administration: 0.25 mg (02/10/2019), 0.25 mg (03/18/2019), 0.25 mg (04/15/2019), 0.25 mg (05/13/2019), 0.25 mg (06/10/2019), 0.25 mg (07/06/2019), 0.25 mg (08/04/2019), 0.25 mg (08/31/2019) bendamustine (BENDEKA) 150 mg in sodium chloride 0.9 % 50 mL (2.6786 mg/mL) chemo infusion, 80 mg/m2 = 150 mg (80 % of original dose 100 mg/m2), Intravenous,  Once, 8 of 10 cycles Dose modification: 80 mg/m2 (80 % of original dose 100 mg/m2, Cycle 1, Reason: Provider Judgment), 75 mg/m2 (75 % of original dose 100 mg/m2, Cycle 2, Reason: Provider Judgment), 60 mg/m2 (original dose 100 mg/m2, Cycle 3, Reason: Provider Judgment, Comment: SCr = 2.05) Administration: 150 mg (02/10/2019), 150 mg (02/11/2019), 125 mg (03/18/2019), 125 mg (03/19/2019), 100 mg (04/15/2019), 100 mg (04/16/2019), 125 mg (05/13/2019), 125 mg (05/14/2019), 125 mg (06/10/2019), 125 mg (06/11/2019), 125 mg (07/06/2019), 125 mg (07/07/2019), 125 mg (08/04/2019), 125 mg (08/05/2019), 125 mg (08/31/2019), 125 mg (09/01/2019)  for chemotherapy treatment.    Multiple myeloma in relapse ([place])  04/05/2016 Initial Diagnosis   Multiple  myeloma in relapse ([place])   10/29/2018 - 12/23/2018 Chemotherapy   The patient had elotuzumab (EMPLICITI) 696 mg in sodium chloride 0.9 % 230 mL chemo infusion, 10 mg/kg = 700 mg, Intravenous, Once, 2 of 5 cycles Administration: 700 mg (10/29/2018), 700 mg (11/05/2018), 700 mg (11/12/2018), 700 mg (11/19/2018), 700 mg (11/26/2018), 700 mg (12/03/2018), 700 mg (12/10/2018), 700 mg (12/17/2018)  for chemotherapy treatment.    12/31/2018 - 01/28/2019 Chemotherapy   The patient had daratumumab (DARZALEX) 1,000 mg in sodium chloride 0.9 % 950 mL (1 mg/mL) chemo infusion, 1,040 mg, Intravenous, Once, 1 of 1 cycle Administration: 1,000 mg (12/31/2018) daratumumab (DARZALEX) 1,000 mg in sodium chloride 0.9 % 450 mL (2 mg/mL) chemo infusion, 1,040 mg, Intravenous, Once, 2 of 10 cycles Administration: 1,000 mg (01/08/2019), 1,000 mg (01/14/2019), 1,000 mg (01/21/2019), 1,000 mg (01/28/2019)  for chemotherapy treatment.    02/10/2019 -  Chemotherapy   The patient had palonosetron (ALOXI) injection 0.25 mg, 0.25 mg, Intravenous,  Once, 8 of 10 cycles Administration: 0.25 mg (02/10/2019), 0.25 mg (03/18/2019), 0.25 mg (04/15/2019), 0.25 mg (05/13/2019), 0.25 mg (06/10/2019), 0.25 mg (07/06/2019), 0.25 mg (08/04/2019), 0.25 mg (08/31/2019) bendamustine (BENDEKA) 150 mg in sodium chloride 0.9 % 50 mL (2.6786 mg/mL) chemo infusion, 80 mg/m2 = 150 mg (80 % of original dose 100 mg/m2), Intravenous,  Once, 8 of 10 cycles Dose modification: 80 mg/m2 (80 % of original dose 100 mg/m2, Cycle 1, Reason: Provider Judgment), 75 mg/m2 (75 % of original dose 100 mg/m2, Cycle 2, Reason: Provider Judgment), 60 mg/m2 (original dose 100 mg/m2, Cycle 3, Reason: Provider Judgment, Comment: SCr = 2.05) Administration: 150 mg (02/10/2019), 150  mg (02/11/2019), 125 mg (03/18/2019), 125 mg (03/19/2019), 100 mg (04/15/2019), 100 mg (04/16/2019), 125 mg (05/13/2019), 125 mg (05/14/2019), 125 mg (06/10/2019), 125 mg (06/11/2019), 125 mg (07/06/2019), 125 mg (07/07/2019), 125 mg  (08/04/2019), 125 mg (08/05/2019), 125 mg (08/31/2019), 125 mg (09/01/2019)  for chemotherapy treatment.       REVIEW OF SYSTEMS:   Noncontributory except as noted in the HPI.  I have reviewed the past medical history, past surgical history, social history and family history with the patient and they are unchanged from previous note.   PHYSICAL EXAMINATION: ECOG PERFORMANCE STATUS: 2 - Symptomatic, <50% confined to bed  Vitals:   10/01/19 1000 10/01/19 1100  BP: (!) 165/83 (!) 146/78  Pulse: 95 86  Resp: (!) 28 (!) 26  Temp:    SpO2: 92% 94%   Filed Weights   09/28/19 0314  Weight: 140 lb (63.5 kg)    Intake/Output from previous day: 02/18 0701 - 02/19 0700 In: 100 [IV Piggyback:100] Out: 300 [Urine:300]  GENERAL: Frail elderly male in no acute distress SKIN: skin color, texture, turgor are normal, no rashes or significant lesions EYES: normal, Conjunctiva are pink and non-injected, sclera clear OROPHARYNX:no exudate, no erythema and lips, buccal mucosa, and tongue normal  LYMPH:  no palpable lymphadenopathy in the cervical, axillary or inguinal LUNGS: Diminished air entry at the lung bases HEART: regular rate & rhythm and no murmurs and no lower extremity edema ABDOMEN:abdomen soft, non-tender and normal bowel sounds Musculoskeletal:no cyanosis of digits and no clubbing  NEURO: alert & oriented x 3 with fluent speech, no focal motor/sensory deficits  LABORATORY DATA:  I have reviewed the data as listed CMP Latest Ref Rng & Units 10/01/2019 09/30/2019 09/29/2019  Glucose 70 - 99 mg/dL 98 114(H) -  BUN 8 - 23 mg/dL 37(H) 40(H) -  Creatinine 0.61 - 1.24 mg/dL 1.72(H) 2.03(H) 2.12(H)  Sodium 135 - 145 mmol/L 138 137 -  Potassium 3.5 - 5.1 mmol/L 3.9 3.4(L) -  Chloride 98 - 111 mmol/L 106 104 -  CO2 22 - 32 mmol/L 24 23 -  Calcium 8.9 - 10.3 mg/dL 8.7(L) 8.6(L) -  Total Protein 6.5 - 8.1 g/dL 4.7(L) 4.9(L) -  Total Bilirubin 0.3 - 1.2 mg/dL 0.7 0.4 -  Alkaline Phos 38  - 126 U/L 49 47 -  AST 15 - 41 U/L 31 37 -  ALT 0 - 44 U/L 17 18 -    Lab Results  Component Value Date   WBC 3.2 (L) 10/01/2019   HGB 9.8 (L) 10/01/2019   HCT 30.3 (L) 10/01/2019   MCV 93.8 10/01/2019   PLT 108 (L) 10/01/2019   NEUTROABS 2.3 09/28/2019    DG CHEST PORT 1 VIEW  Result Date: 09/30/2019 CLINICAL DATA:  Pneumonia EXAM: PORTABLE CHEST 1 VIEW COMPARISON:  09/28/2019 FINDINGS: Multifocal bilateral airspace opacities again noted, stable since prior study. Heart is normal size. Mild hyperinflation of the lungs. No visible effusions or pneumothorax. No acute bony abnormality. IMPRESSION: Patchy bilateral airspace disease, unchanged since prior study. Electronically Signed   By: Rolm Baptise M.D.   On: 09/30/2019 10:22   DG CHEST PORT 1 VIEW  Result Date: 09/28/2019 CLINICAL DATA:  Hypoxia. EXAM: PORTABLE CHEST 1 VIEW COMPARISON:  Chest radiograph 09/28/2019 FINDINGS: Redemonstrated implanted loop recorder device which projects in the region of the left chest wall. Heart size within normal limits. Redemonstrated multifocal bilateral airspace disease most prominent within the right lung apex. Findings is not significant changed since prior examination  performed earlier the same day. No evidence of pleural effusion or pneumothorax. No acute bony abnormality. Thoracic spondylosis. Overlying cardiac monitoring leads. IMPRESSION: Bilateral multifocal airspace disease most prominent within the right lung apex, not significantly changed since chest radiograph performed earlier the same day. Findings likely reflect multifocal pneumonia. Edema is also a consideration. Electronically Signed   By: Kellie Simmering DO   On: 09/28/2019 12:44   DG Chest Port 1 View  Result Date: 09/28/2019 CLINICAL DATA:  Fever and weakness. EXAM: PORTABLE CHEST 1 VIEW COMPARISON:  06/22/2019 FINDINGS: Implanted loop recorder projects over the left chest wall. Multifocal interstitial and airspace opacities throughout  both lungs, right greater than left. Unchanged heart size and mediastinal contours. No pleural effusion or pneumothorax. Degenerative change in the spine. IMPRESSION: Bilateral interstitial and airspace opacities throughout both lungs, right greater than left, suspicious for multifocal pneumonia. Asymmetric pulmonary edema is felt less likely. Electronically Signed   By: Keith Rake M.D.   On: 09/28/2019 03:51   CUP PACEART REMOTE DEVICE CHECK  Result Date: 09/06/2019 Carelink summary report received. Battery status OK. Normal device function. No new symptom episodes, tachy episodes, brady, or pause episodes. No new AF episodes. Monthly summary reports and ROV/PRN   ASSESSMENT AND PLAN: 1.  IgG lambda myeloma 2.  Anemia due to underlying malignancy, recent chemotherapy, renal insufficiency, and recent melena.  Could also have bone marrow suppression secondary to acute infection. 3.  Multifocal pneumonia with possible developing early sepsis, improving 4.  Hypertension 5.  Acute on chronic kidney disease 6.  Leukopenia and thrombocytopenia due to recent chemotherapy and acute infection 7.  History of CVA 8.  Chronic osteomyelitis of the jaw  -Labs from today have been reviewed.  Hemoglobin is 9.8 which is stable.  Recommend close monitoring.  Transfuse PRBCs for hemoglobin less than 8 or active bleeding.  His leukopenia and thrombocytopenia are mild and recommend close monitoring at this time. -His respiratory status continues to improve.  He remains afebrile.  Remains on IV Rocephin and oral azithromycin.   LOS: 3 days   Mikey Bussing, DNP, AGPCNP-BC, AOCNP 10/01/19

## 2019-10-01 NOTE — Evaluation (Signed)
Physical Therapy Evaluation Patient Details Name: Christian Burns MRN: [id number] DOB: 06-19-1930 Today's Date: 10/01/2019   History of Present Illness  86 year old male with history of multiple myeloma status post chemo therapy, blood transfusions, last transfusion was last week, essential hypertension, chronic osteomyelitis of the jaw, stage III CKD, CVA, and admitted for acute respiratory failure with hypoxia-secondary to multifocal pneumonia  Clinical Impression  Pt admitted with above diagnosis.  Pt currently with functional limitations due to the deficits listed below (see PT Problem List). Pt will benefit from skilled PT to increase their independence and safety with mobility to allow discharge to the venue listed below.  Pt assisted with ambulating however distance limited due to nausea.  Pt currently min assist due to weakness and instability.  Pt agreeable to SNF upon d/c since he is not yet back to baseline.     Follow Up Recommendations SNF;Supervision/Assistance - 24 hour    Equipment Recommendations  None recommended by PT    Recommendations for Other Services       Precautions / Restrictions Precautions Precautions: Fall Precaution Comments: monitor sats      Mobility  Bed Mobility Overal bed mobility: Needs Assistance Bed Mobility: Supine to Sit     Supine to sit: Min guard;HOB elevated     General bed mobility comments: min/guard for safety, lines  Transfers Overall transfer level: Needs assistance Equipment used: Rolling walker (2 wheeled) Transfers: Sit to/from Stand Sit to Stand: Min assist;+2 safety/equipment         General transfer comment: verbal cues for hand placement, assist to rise and steady, pt uses legs posteriorly against bed to self assist  Ambulation/Gait Ambulation/Gait assistance: Min assist;+2 safety/equipment Gait Distance (Feet): 20 Feet Assistive device: Rolling walker (2 wheeled) Gait Pattern/deviations: Step-through  pattern;Decreased stride length     General Gait Details: verbal cues for RW positioning and posture, SPO2 93% on 2L O2, pt reports nausea limiting distance  Stairs            Wheelchair Mobility    Modified Rankin (Stroke Patients Only)       Balance Overall balance assessment: Needs assistance         Standing balance support: Bilateral upper extremity supported Standing balance-Leahy Scale: Poor Standing balance comment: reliant on UE support at this time                             Pertinent Vitals/Pain Pain Assessment: No/denies pain    Home Living Family/patient expects to be discharged to:: Private residence Living Arrangements: Spouse/significant other Available Help at Discharge: Family;Personal care attendant;Available PRN/intermittently Type of Home: Independent living facility       Home Layout: One level Home Equipment: Bedside commode;Walker - 2 wheels;Cane - single point;Grab bars - tub/shower;Shower seat;Grab bars - toilet Additional Comments: lives with wife in [facility]    Prior Function Level of Independence: Independent with assistive device(s)         Comments: pt reports not typically using assistive device however will use RW if/when needed     Hand Dominance   Dominant Hand: Left    Extremity/Trunk Assessment        Lower Extremity Assessment Lower Extremity Assessment: Generalized weakness    Cervical / Trunk Assessment Cervical / Trunk Assessment: Normal  Communication   Communication: HOH(pt very softspoken, hx of jaw osteomyelitis)  Cognition Arousal/Alertness: Awake/alert Behavior During Therapy: WFL for tasks assessed/performed Overall Cognitive Status:  Within Functional Limits for tasks assessed                                        General Comments General comments (skin integrity, edema, etc.): Pt currently on 2L in room however [place] only in one nare and SPO2 95%, dropped to  86% on room air so applied 2L O2 for ambulation, 93% on 2L O2 for ambulation    Exercises     Assessment/Plan    PT Assessment Patient needs continued PT services  PT Problem List Decreased balance;Decreased knowledge of use of DME;Cardiopulmonary status limiting activity;Decreased activity tolerance;Decreased strength;Decreased mobility       PT Treatment Interventions DME instruction;Therapeutic exercise;Stair training;Functional mobility training;Therapeutic activities;Patient/family education;Gait training;Balance training    PT Goals (Current goals can be found in the Care Plan section)  Acute Rehab PT Goals PT Goal Formulation: With patient Time For Goal Achievement: 10/15/19 Potential to Achieve Goals: Good    Frequency Min 3X/week   Barriers to discharge        Co-evaluation               AM-PAC PT "6 Clicks" Mobility  Outcome Measure Help needed turning from your back to your side while in a flat bed without using bedrails?: A Little Help needed moving from lying on your back to sitting on the side of a flat bed without using bedrails?: A Little Help needed moving to and from a bed to a chair (including a wheelchair)?: A Little Help needed standing up from a chair using your arms (e.g., wheelchair or bedside chair)?: A Little Help needed to walk in hospital room?: A Lot Help needed climbing 3-5 steps with a railing? : A Lot 6 Click Score: 16    End of Session Equipment Utilized During Treatment: Oxygen;Gait belt Activity Tolerance: Patient tolerated treatment well Patient left: in chair;with call bell/phone within reach;with chair alarm set Nurse Communication: Mobility status PT Visit Diagnosis: Difficulty in walking, not elsewhere classified (R26.2);Muscle weakness (generalized) (M62.81)    Time: 4840-3979 PT Time Calculation (min) (ACUTE ONLY): 22 min   Charges:   PT Evaluation $PT Eval Low Complexity: 1 Low     Kati PT, DPT Acute  Rehabilitation Services Office: [phone]  Ugo Thoma,KATHrine E 10/01/2019, 3:04 PM

## 2019-10-01 NOTE — Plan of Care (Signed)

## 2019-10-01 NOTE — Progress Notes (Addendum)
[facility]  PROGRESS NOTE  Christian Burns LAG:[id number] DOB: 02/18/1930 DOA: 09/28/2019 PCP: Leanna Battles, MD   Brief HPI:   86 year old male with history of multiple myeloma status post chemo therapy, blood transfusions, last transfusion was last week, essential hypertension, chronic osteomyelitis of the jaw, stage III CKD, history of stroke in the past came to ED with generalized weakness, fever, cough and shortness of breath.  Covid 19 test was negative.    Subjective   Patient seen and examined, breathing has improved.  Oxygen requirement has gone down to 2 L/min.   Assessment/Plan:     1. Acute respiratory failure with hypoxia-secondary to multifocal pneumonia, patient started on vancomycin, ceftriaxone, Zithromax.  MRSA PCR is negative, vancomycin was discontinued.  Patient's O2 sat requirement has gone down to 2 L/min.   Repeat chest x-ray shows patchy bilateral airspace disease. 2. Symptomatic anemia- patient presented with hemoglobin of 6.9, s/p 2 units PRBC.  Hemoglobin is up to 9.8. 3. Gram-positive rod bacteremia-gram-positive rods growing in anaerobic bottle.  Called and discussed with ID Dr. Drucilla Schmidt, he recommends to hold off on changing antibiotics.  Will follow final culture results. 4. Hypertension-patient is currently on amlodipine, benazepril is on hold due to worsening renal function.  Blood pressure is stable. 5. CKD stage III-patient baseline creatinine around 1.6 as of August 31, 2019.  He presented with creatinine of 1.86, creatinine worsened to 2.12, after he received 1 dose of Lasix.  Creatinine is down to 1.72 today.  Follow BMP in a.m.   6. Pancytopenia-likely from chemotherapy.  Follow CBC in a.m.  Dr. Marin Olp is following. 7. Multiple myeloma-patient followed by Dr. Marin Olp.  Dr. Marin Olp is following patient in the hospital. 8. History of stroke-continue aspirin 9. Chronic osteomyelitis of the jaw-patient is on suppressive therapy with penicillin.   Penicillin is on hold as patient is  currently on ceftriaxone and Zithromax as above.    SpO2: 95 % O2 Flow Rate (L/min): 4 L/min   COVID-19 Labs  No results for input(s): DDIMER, FERRITIN, LDH, CRP in the last 72 hours.  Lab Results  Component Value Date   SARSCOV2NAA NEGATIVE 09/28/2019   SARSCOV2NAA RESULT:  NEGATIVE 09/15/2019   SARSCOV2NAA NEGATIVE 06/22/2019   [place] NEGATIVE 04/19/2019     CBG: No results for input(s): GLUCAP in the last 168 hours.  CBC: Recent Labs  Lab 09/28/19 0335 09/29/19 0223 09/30/19 0217 10/01/19 0222  WBC 2.7*  --  4.1 3.2*  NEUTROABS 2.3  --   --   --   HGB 6.9* 8.6* 10.1* 9.8*  HCT 20.8* 26.7* 30.1* 30.3*  MCV 97.2  --  93.5 93.8  PLT 105*  --  110* 108*    Basic Metabolic Panel: Recent Labs  Lab 09/28/19 0335 09/29/19 0148 09/30/19 0217 10/01/19 0222  NA 137  --  137 138  K 3.6  --  3.4* 3.9  CL 105  --  104 106  CO2 22  --  23 24  GLUCOSE 117*  --  114* 98  BUN 32*  --  40* 37*  CREATININE 1.84* 2.12* 2.03* 1.72*  CALCIUM 9.0  --  8.6* 8.7*     Liver Function Tests: Recent Labs  Lab 09/28/19 0335 09/30/19 0217 10/01/19 0222  AST 46* 37 31  ALT 17 18 17   ALKPHOS 43 47 49  BILITOT 1.2 0.4 0.7  PROT 4.8* 4.9* 4.7*  ALBUMIN 2.1* 2.0* 1.9*        DVT prophylaxis:  Lovenox  Code Status: Full code  Family Communication: No family at bedside  Disposition Plan: PT evaluation has been obtained, currently pending.  Patient is still requiring 2 L/min of oxygen.  He was not on oxygen at home.  Chest x-ray still shows bilateral infiltrates.  Likely will need skilled nursing facility for rehab.         Scheduled medications:  . amLODipine  5 mg Oral Daily  . azithromycin  500 mg Oral Daily  . Chlorhexidine Gluconate Cloth  6 each Topical Daily  . dronabinol  5 mg Oral BID AC  . enoxaparin (LOVENOX) injection  30 mg Subcutaneous Q24H  . finasteride  5 mg Oral Daily  . fluconazole  200 mg Oral Daily   . mouth rinse  15 mL Mouth Rinse BID  . pantoprazole  40 mg Oral BID  . [START ON 10/03/2019] penicillin v potassium  500 mg Oral Q6H  . predniSONE  20 mg Oral Q breakfast  . pyridOXINE  100 mg Oral Daily  . [START ON 10/03/2019] Vitamin D (Ergocalciferol)  50,000 Units Oral Q Sun    Consultants:  Oncology  Procedures:    Antibiotics:   Anti-infectives (From admission, onward)   Start     Dose/Rate Route Frequency Ordered Stop   10/03/19 0600  penicillin v potassium (VEETID) tablet 500 mg     500 mg Oral Every 6 hours 09/29/19 0836     09/30/19 2330  metroNIDAZOLE (FLAGYL) IVPB 500 mg     500 mg 100 mL/hr over 60 Minutes Intravenous  Once 09/30/19 2321 10/01/19 0106   09/30/19 0600  vancomycin (VANCOREADY) IVPB 1250 mg/250 mL  Status:  Discontinued     1,250 mg 166.7 mL/hr over 90 Minutes Intravenous Every 48 hours 09/28/19 0722 09/29/19 1008   09/29/19 1000  azithromycin (ZITHROMAX) tablet 500 mg     500 mg Oral Daily 09/29/19 0845 10/03/19 0959   09/28/19 1600  azithromycin (ZITHROMAX) 500 mg in sodium chloride 0.9 % 250 mL IVPB  Status:  Discontinued     500 mg 250 mL/hr over 60 Minutes Intravenous Every 24 hours 09/28/19 1454 09/29/19 0845   09/28/19 1200  fluconazole (DIFLUCAN) tablet 200 mg    Note to Pharmacy: Take 400 mg on day one, then 200 mg daily for three weeks.     200 mg Oral Daily 09/28/19 0701     09/28/19 1200  cefTRIAXone (ROCEPHIN) 2 g in sodium chloride 0.9 % 100 mL IVPB     2 g 200 mL/hr over 30 Minutes Intravenous Every 24 hours 09/28/19 0703 10/03/19 1159   09/28/19 0800  azithromycin (ZITHROMAX) 500 mg in sodium chloride 0.9 % 250 mL IVPB  Status:  Discontinued     500 mg 250 mL/hr over 60 Minutes Intravenous Every 24 hours 09/28/19 0703 09/28/19 1454   09/28/19 0715  penicillin v potassium (VEETID) tablet 500 mg  Status:  Discontinued     500 mg Oral Every 6 hours 09/28/19 0704 09/29/19 0816   09/28/19 0400  vancomycin (VANCOREADY) IVPB 1500  mg/300 mL     1,500 mg 150 mL/hr over 120 Minutes Intravenous  Once 09/28/19 0326 09/28/19 1150   09/28/19 0330  ceFEPIme (MAXIPIME) 2 g in sodium chloride 0.9 % 100 mL IVPB     2 g 200 mL/hr over 30 Minutes Intravenous  Once 09/28/19 0319 09/28/19 0520   09/28/19 0330  metroNIDAZOLE (FLAGYL) IVPB 500 mg     500 mg 100 mL/hr  over 60 Minutes Intravenous  Once 09/28/19 0319 09/28/19 0546   09/28/19 0330  vancomycin (VANCOCIN) IVPB 1000 mg/200 mL premix  Status:  Discontinued     1,000 mg 200 mL/hr over 60 Minutes Intravenous  Once 09/28/19 0319 09/28/19 0326       Objective   Vitals:   10/01/19 0400 10/01/19 0500 10/01/19 0808 10/01/19 0900  BP: 139/78 (!) 159/83  (!) 146/69  Pulse: 70 72  92  Resp:    (!) 28  Temp:   98.8 F (37.1 C)   TempSrc:   Oral   SpO2: 94% 94%  95%  Weight:      Height:        Intake/Output Summary (Last 24 hours) at 10/01/2019 1052 Last data filed at 10/01/2019 0530 Gross per 24 hour  Intake 100 ml  Output 300 ml  Net -200 ml    02/17 1901 - 02/19 0700 In: 100  Out: 925 [Urine:925]  Filed Weights   09/28/19 0314  Weight: 63.5 kg    Physical Examination:  General-appears in no acute distress Heart-S1-S2, regular, no murmur auscultated Lungs-clear to auscultation bilaterally, no wheezing or crackles auscultated Abdomen-soft, nontender, no organomegaly Extremities-no edema in the lower extremities Neuro-alert, oriented x3, no focal deficit noted   Data Reviewed:   Recent Results (from the past 240 hour(s))  Blood Culture (routine x 2)     Status: None (Preliminary result)   Collection Time: 09/28/19  4:00 AM   Specimen: BLOOD  Result Value Ref Range Status   Specimen Description   Final    BLOOD BLOOD LEFT HAND Performed at [facility], [place] [address] [place], [place] [address]    Special Requests   Final    BOTTLES DRAWN AEROBIC AND ANAEROBIC Blood Culture results may not be optimal due to an excessive  volume of blood received in culture bottles Performed at [facility] [address], [place], [place] [address]    Culture   Final    NO GROWTH 3 DAYS Performed at [facility], [place] [address], [place], [place] [address]    Report Status PENDING  Incomplete  Blood Culture (routine x 2)     Status: None (Preliminary result)   Collection Time: 09/28/19  4:00 AM   Specimen: BLOOD  Result Value Ref Range Status   Specimen Description   Final    BLOOD LEFT ANTECUBITAL Performed at [facility] [address] [place], [place] [address]    Special Requests   Final    BOTTLES DRAWN AEROBIC AND ANAEROBIC Blood Culture adequate volume Performed at [facility] [address], [place], [place] [address]    Culture  Setup Time   Final    ANAEROBIC BOTTLE ONLY GRAM POSITIVE RODS CRITICAL RESULT CALLED TO, READ BACK BY AND VERIFIED WITH: B GREEN PHARMD 09/30/19 2256 JDW    Culture   Final    NO GROWTH 3 DAYS Performed at [facility], [address], [place], [place] [address]    Report Status PENDING  Incomplete  SARS CORONAVIRUS 2 (TAT 6-24 HRS) Nasopharyngeal Nasopharyngeal Swab     Status: None   Collection Time: 09/28/19  5:00 AM   Specimen: Nasopharyngeal Swab  Result Value Ref Range Status   SARS Coronavirus 2 NEGATIVE NEGATIVE Final    Comment: (NOTE) SARS-CoV-2 target nucleic acids are NOT DETECTED. The SARS-CoV-2 RNA is generally detectable in upper and lower respiratory  specimens during the acute phase of infection. Negative results do not preclude SARS-CoV-2 infection, do not rule out co-infections with other pathogens, and should not be used as the sole basis for treatment or other patient management decisions. Negative results must be combined with clinical observations, patient history, and epidemiological information. The expected result is Negative. Fact Sheet for  Patients: SugarRoll.be Fact Sheet for Healthcare Providers: https://www.woods-mathews.com/ This test is not yet approved or cleared by the [place] FDA and  has been authorized for detection and/or diagnosis of SARS-CoV-2 by FDA under an Emergency Use Authorization (EUA). This EUA will remain  in effect (meaning this test can be used) for the duration of the COVID-19 declaration under Section 56 4(b)(1) of the Act, 21 U.S.C. section 360bbb-3(b)(1), unless the authorization is terminated or revoked sooner. Performed at [facility], [place] [address] [place], [place] [address]   MRSA PCR Screening     Status: None   Collection Time: 09/28/19  4:12 PM   Specimen: Nasal Mucosa; Nasopharyngeal  Result Value Ref Range Status   MRSA by PCR NEGATIVE NEGATIVE Final    Comment:        The GeneXpert MRSA Assay (FDA approved for NASAL specimens only), is one component of a comprehensive MRSA colonization surveillance program. It is not intended to diagnose MRSA infection nor to guide or monitor treatment for MRSA infections. Performed at [facility], [place] [address], [place], [place] [address]     No results for input(s): LIPASE, AMYLASE in the last 168 hours. No results for input(s): AMMONIA in the last 168 hours.  Cardiac Enzymes: No results for input(s): CKTOTAL, CKMB, CKMBINDEX, TROPONINI in the last 168 hours. BNP (last 3 results) Recent Labs    10/06/18 2142  BNP 109.5*    ProBNP (last 3 results) No results for input(s): PROBNP in the last 8760 hours.  Studies:  DG CHEST PORT 1 VIEW  Result Date: 09/30/2019 CLINICAL DATA:  Pneumonia EXAM: PORTABLE CHEST 1 VIEW COMPARISON:  09/28/2019 FINDINGS: Multifocal bilateral airspace opacities again noted, stable since prior study. Heart is normal size. Mild hyperinflation of the lungs. No visible effusions or pneumothorax. No acute bony abnormality. IMPRESSION:  Patchy bilateral airspace disease, unchanged since prior study. Electronically Signed   By: Rolm Baptise M.D.   On: 09/30/2019 10:22     Admission status: Inpatient: Based on patients clinical presentation and evaluation of above clinical data, I have made determination that patient meets Inpatient criteria at this time.   Oswald Hillock   [facility] If 7PM-7AM, please contact night-coverage at [email or web address], Office  [phone]  password [facility]  10/01/2019, 10:52 AM  LOS: 3 days

## 2019-10-02 LAB — CREATININE, SERUM
Creatinine, Ser: 1.51 mg/dL — ABNORMAL HIGH (ref 0.61–1.24)
GFR calc Af Amer: 47 mL/min — ABNORMAL LOW (ref >60)
GFR calc non Af Amer: 41 mL/min — ABNORMAL LOW (ref >60)

## 2019-10-02 MED ORDER — ENOXAPARIN SODIUM 40 MG/0.4ML ~~LOC~~ SOLN
40.0000 mg | SUBCUTANEOUS | Status: DC
  Administered 2019-10-02: 40 mg via SUBCUTANEOUS
  Filled 2019-10-02: qty 0.4

## 2019-10-02 NOTE — Progress Notes (Signed)
[facility]  PROGRESS NOTE  Christian Burns STM:[id number] DOB: [birth year] DOA: 09/28/2019 PCP: Leanna Battles, MD   Brief HPI:   86 year old male with history of multiple myeloma status post chemo therapy, blood transfusions, last transfusion was last week, essential hypertension, chronic osteomyelitis of the jaw, stage III CKD, history of stroke in the past came to ED with generalized weakness, fever, cough and shortness of breath.  Covid 19 test was negative.    Subjective   Patient seen and examined, complains of nausea this morning.  Denies shortness of breath.  O2 requirement is now down to 1 L/min.   Assessment/Plan:     1. Acute respiratory failure with hypoxia-secondary to multifocal pneumonia, patient started on vancomycin, ceftriaxone, Zithromax.  MRSA PCR is negative, vancomycin was discontinued.  Patient's O2 sat requirement has gone down to 1 L/min.   Repeat chest x-ray shows patchy bilateral airspace disease. 2. Symptomatic anemia- patient presented with hemoglobin of 6.9, s/p 2 units PRBC.  Hemoglobin is up to 9.8. 3. Gram-positive rod bacteremia-gram-positive rods growing in anaerobic bottle.  Called and discussed with ID Dr. Drucilla Schmidt, he recommends to hold off on changing antibiotics.  Will follow final culture results. 4. Hypertension-patient is currently on amlodipine, benazepril is on hold due to worsening renal function.  Blood pressure is stable. 5. CKD stage III-patient baseline creatinine around 1.6 as of August 31, 2019.  He presented with creatinine of 1.86, creatinine worsened to 2.12, after he received 1 dose of Lasix.  Creatinine is down to 1.51 today.  Follow BMP in a.m.   6. Pancytopenia-likely from chemotherapy.  Follow CBC in a.m.  Dr. Marin Olp is following. 7. Multiple myeloma-patient followed by Dr. Marin Olp.  Dr. Marin Olp is following patient in the hospital. 8. History of stroke-continue aspirin 9. Chronic osteomyelitis of the jaw-patient is on  suppressive therapy with penicillin.  Penicillin is on hold as patient is  currently on ceftriaxone and Zithromax as above.    SpO2: 94 % O2 Flow Rate (L/min): 2 L/min   COVID-19 Labs  No results for input(s): DDIMER, FERRITIN, LDH, CRP in the last 72 hours.  Lab Results  Component Value Date   SARSCOV2NAA NEGATIVE 09/28/2019   SARSCOV2NAA RESULT:  NEGATIVE 09/15/2019   SARSCOV2NAA NEGATIVE 06/22/2019   [place] NEGATIVE 04/19/2019     CBG: No results for input(s): GLUCAP in the last 168 hours.  CBC: Recent Labs  Lab 09/28/19 0335 09/29/19 0223 09/30/19 0217 10/01/19 0222  WBC 2.7*  --  4.1 3.2*  NEUTROABS 2.3  --   --   --   HGB 6.9* 8.6* 10.1* 9.8*  HCT 20.8* 26.7* 30.1* 30.3*  MCV 97.2  --  93.5 93.8  PLT 105*  --  110* 108*    Basic Metabolic Panel: Recent Labs  Lab 09/28/19 0335 09/29/19 0148 09/30/19 0217 10/01/19 0222 10/02/19 0501  NA 137  --  137 138  --   K 3.6  --  3.4* 3.9  --   CL 105  --  104 106  --   CO2 22  --  23 24  --   GLUCOSE 117*  --  114* 98  --   BUN 32*  --  40* 37*  --   CREATININE 1.84* 2.12* 2.03* 1.72* 1.51*  CALCIUM 9.0  --  8.6* 8.7*  --      Liver Function Tests: Recent Labs  Lab 09/28/19 0335 09/30/19 0217 10/01/19 0222  AST 46* 37 31  ALT 17  18 17  ALKPHOS 43 47 49  BILITOT 1.2 0.4 0.7  PROT 4.8* 4.9* 4.7*  ALBUMIN 2.1* 2.0* 1.9*        DVT prophylaxis: Lovenox  Code Status: Full code  Family Communication: No family at bedside  Disposition Plan: PT evaluation has been obtained, recommend skilled nursing facility.  Patient is clinically improving, still requiring 1 L/min of oxygen.  Hopefully we can wean off oxygen next 2 to 3 days and discharged to skilled nursing facility.         Scheduled medications:  . amLODipine  5 mg Oral Daily  . azithromycin  500 mg Oral Daily  . Chlorhexidine Gluconate Cloth  6 each Topical Daily  . dronabinol  5 mg Oral BID AC  . enoxaparin (LOVENOX)  injection  30 mg Subcutaneous Q24H  . finasteride  5 mg Oral Daily  . fluconazole  200 mg Oral Daily  . mouth rinse  15 mL Mouth Rinse BID  . pantoprazole  40 mg Oral BID  . [START ON 10/03/2019] penicillin v potassium  500 mg Oral Q6H  . predniSONE  20 mg Oral Q breakfast  . pyridOXINE  100 mg Oral Daily  . [START ON 10/03/2019] Vitamin D (Ergocalciferol)  50,000 Units Oral Q Sun    Consultants:  Oncology  Procedures:    Antibiotics:   Anti-infectives (From admission, onward)   Start     Dose/Rate Route Frequency Ordered Stop   10/03/19 0600  penicillin v potassium (VEETID) tablet 500 mg     500 mg Oral Every 6 hours 09/29/19 0836     09/30/19 2330  metroNIDAZOLE (FLAGYL) IVPB 500 mg     500 mg 100 mL/hr over 60 Minutes Intravenous  Once 09/30/19 2321 10/01/19 0106   09/30/19 0600  vancomycin (VANCOREADY) IVPB 1250 mg/250 mL  Status:  Discontinued     1,250 mg 166.7 mL/hr over 90 Minutes Intravenous Every 48 hours 09/28/19 0722 09/29/19 1008   09/29/19 1000  azithromycin (ZITHROMAX) tablet 500 mg     500 mg Oral Daily 09/29/19 0845 10/03/19 0959   09/28/19 1600  azithromycin (ZITHROMAX) 500 mg in sodium chloride 0.9 % 250 mL IVPB  Status:  Discontinued     500 mg 250 mL/hr over 60 Minutes Intravenous Every 24 hours 09/28/19 1454 09/29/19 0845   09/28/19 1200  fluconazole (DIFLUCAN) tablet 200 mg    Note to Pharmacy: Take 400 mg on day one, then 200 mg daily for three weeks.     200 mg Oral Daily 09/28/19 0701     09/28/19 1200  cefTRIAXone (ROCEPHIN) 2 g in sodium chloride 0.9 % 100 mL IVPB     2 g 200 mL/hr over 30 Minutes Intravenous Every 24 hours 09/28/19 0703 10/03/19 1159   09/28/19 0800  azithromycin (ZITHROMAX) 500 mg in sodium chloride 0.9 % 250 mL IVPB  Status:  Discontinued     500 mg 250 mL/hr over 60 Minutes Intravenous Every 24 hours 09/28/19 0703 09/28/19 1454   09/28/19 0715  penicillin v potassium (VEETID) tablet 500 mg  Status:  Discontinued     500 mg  Oral Every 6 hours 09/28/19 0704 09/29/19 0816   09/28/19 0400  vancomycin (VANCOREADY) IVPB 1500 mg/300 mL     1,500 mg 150 mL/hr over 120 Minutes Intravenous  Once 09/28/19 0326 09/28/19 1150   09/28/19 0330  ceFEPIme (MAXIPIME) 2 g in sodium chloride 0.9 % 100 mL IVPB     2 g 200  mL/hr over 30 Minutes Intravenous  Once 09/28/19 0319 09/28/19 0520   09/28/19 0330  metroNIDAZOLE (FLAGYL) IVPB 500 mg     500 mg 100 mL/hr over 60 Minutes Intravenous  Once 09/28/19 0319 09/28/19 0546   09/28/19 0330  vancomycin (VANCOCIN) IVPB 1000 mg/200 mL premix  Status:  Discontinued     1,000 mg 200 mL/hr over 60 Minutes Intravenous  Once 09/28/19 0319 09/28/19 0326       Objective   Vitals:   10/01/19 2034 10/02/19 0437 10/02/19 0843 10/02/19 0936  BP: 132/81 (!) 150/82  135/78  Pulse: 67 76  90  Resp: 18 18    Temp: 97.8 F (36.6 C) 98.2 F (36.8 C)  98 F (36.7 C)  TempSrc: Oral Oral  Oral  SpO2: 95% 93% 95% 94%  Weight:      Height:        Intake/Output Summary (Last 24 hours) at 10/02/2019 6948 Last data filed at 10/02/2019 5462 Gross per 24 hour  Intake 340 ml  Output 700 ml  Net -360 ml    02/18 1901 - 02/20 0700 In: 340 [P.O.:240] Out: 1000 [Urine:1000]  Filed Weights   09/28/19 0314  Weight: 63.5 kg    Physical Examination:  General-appears in no acute distress Heart-S1-S2, regular, no murmur auscultated Lungs-clear to auscultation bilaterally, no wheezing or crackles auscultated Abdomen-soft, nontender, no organomegaly Extremities-no edema in the lower extremities Neuro-alert, oriented x3, no focal deficit noted   Data Reviewed:   Recent Results (from the past 240 hour(s))  Blood Culture (routine x 2)     Status: None (Preliminary result)   Collection Time: 09/28/19  4:00 AM   Specimen: BLOOD  Result Value Ref Range Status   Specimen Description BLOOD BLOOD LEFT HAND  Final   Special Requests   Final    BOTTLES DRAWN AEROBIC AND ANAEROBIC Blood Culture  results may not be optimal due to an excessive volume of blood received in culture bottles Performed at [facility], [place] [address] [place], [place] [address]    Culture NO GROWTH 4 DAYS  Final   Report Status PENDING  Incomplete  Blood Culture (routine x 2)     Status: None (Preliminary result)   Collection Time: 09/28/19  4:00 AM   Specimen: BLOOD  Result Value Ref Range Status   Specimen Description   Final    BLOOD LEFT ANTECUBITAL Performed at [place] [address] [place], [place] [address]    Special Requests   Final    BOTTLES DRAWN AEROBIC AND ANAEROBIC Blood Culture adequate volume Performed at [facility] [address], [place], [place] [address]    Culture  Setup Time   Final    ANAEROBIC BOTTLE ONLY GRAM POSITIVE RODS CRITICAL RESULT CALLED TO, READ BACK BY AND VERIFIED WITH: B GREEN PHARMD 09/30/19 2256 JDW Performed at [facility], [place] [address], [place], [place] [address]    Culture CULTURE REINCUBATED FOR BETTER GROWTH  Final   Report Status PENDING  Incomplete  SARS CORONAVIRUS 2 (TAT 6-24 HRS) Nasopharyngeal Nasopharyngeal Swab     Status: None   Collection Time: 09/28/19  5:00 AM   Specimen: Nasopharyngeal Swab  Result Value Ref Range Status   SARS Coronavirus 2 NEGATIVE NEGATIVE Final    Comment: (NOTE) SARS-CoV-2 target nucleic acids are NOT DETECTED. The SARS-CoV-2 RNA is generally detectable in upper and lower respiratory specimens during the acute phase of infection.  Negative results do not preclude SARS-CoV-2 infection, do not rule out co-infections with other pathogens, and should not be used as the sole basis for treatment or other patient management decisions. Negative results must be combined with clinical observations, patient history, and epidemiological information. The expected result is Negative. Fact Sheet for  Patients: SugarRoll.be Fact Sheet for Healthcare Providers: https://www.woods-mathews.com/ This test is not yet approved or cleared by the [place] FDA and  has been authorized for detection and/or diagnosis of SARS-CoV-2 by FDA under an Emergency Use Authorization (EUA). This EUA will remain  in effect (meaning this test can be used) for the duration of the COVID-19 declaration under Section 56 4(b)(1) of the Act, 21 U.S.C. section 360bbb-3(b)(1), unless the authorization is terminated or revoked sooner. Performed at [facility], [place] [address], [place], [place] [address]   MRSA PCR Screening     Status: None   Collection Time: 09/28/19  4:12 PM   Specimen: Nasal Mucosa; Nasopharyngeal  Result Value Ref Range Status   MRSA by PCR NEGATIVE NEGATIVE Final    Comment:        The GeneXpert MRSA Assay (FDA approved for NASAL specimens only), is one component of a comprehensive MRSA colonization surveillance program. It is not intended to diagnose MRSA infection nor to guide or monitor treatment for MRSA infections. Performed at [facility], [place] [address] [place], [place] [address]     No results for input(s): LIPASE, AMYLASE in the last 168 hours. No results for input(s): AMMONIA in the last 168 hours.  Cardiac Enzymes: No results for input(s): CKTOTAL, CKMB, CKMBINDEX, TROPONINI in the last 168 hours. BNP (last 3 results) Recent Labs    10/06/18 2142  BNP 109.5*    ProBNP (last 3 results) No results for input(s): PROBNP in the last 8760 hours.  Studies:  DG CHEST PORT 1 VIEW  Result Date: 09/30/2019 CLINICAL DATA:  Pneumonia EXAM: PORTABLE CHEST 1 VIEW COMPARISON:  09/28/2019 FINDINGS: Multifocal bilateral airspace opacities again noted, stable since prior study. Heart is normal size. Mild hyperinflation of the lungs. No visible effusions or pneumothorax. No acute bony abnormality. IMPRESSION:  Patchy bilateral airspace disease, unchanged since prior study. Electronically Signed   By: Rolm Baptise M.D.   On: 09/30/2019 10:22     Admission status: Inpatient: Based on patients clinical presentation and evaluation of above clinical data, I have made determination that patient meets Inpatient criteria at this time.   Oswald Hillock   [facility] If 7PM-7AM, please contact night-coverage at [email or web address], Office  [phone]  password [email or web address]  10/02/2019, 9:58 AM  LOS: 4 days

## 2019-10-02 NOTE — Plan of Care (Signed)
Patient tolerating heart healthy diet.  Able to wean patient off oxygen when sitting up in chair, when laying in bed did have to put him back on 1 liter to stay above 90%.  Patient up to chair for meals and walked up to the nurses station and back with walker, tolerated well.  Grandson in to visit this shift.

## 2019-10-03 DIAGNOSIS — C9002 Multiple myeloma in relapse: Secondary | ICD-10-CM

## 2019-10-03 LAB — CULTURE, BLOOD (ROUTINE X 2)
Culture: NO GROWTH
Special Requests: ADEQUATE

## 2019-10-03 LAB — CREATININE, SERUM
Creatinine, Ser: 1.7 mg/dL — ABNORMAL HIGH (ref 0.61–1.24)
GFR calc Af Amer: 41 mL/min — ABNORMAL LOW (ref >60)
GFR calc non Af Amer: 35 mL/min — ABNORMAL LOW (ref >60)

## 2019-10-03 LAB — SARS CORONAVIRUS 2 (TAT 6-24 HRS): SARS Coronavirus 2: NEGATIVE

## 2019-10-03 MED ORDER — ENOXAPARIN SODIUM 30 MG/0.3ML ~~LOC~~ SOLN
30.0000 mg | SUBCUTANEOUS | Status: DC
  Administered 2019-10-03: 30 mg via SUBCUTANEOUS
  Filled 2019-10-03: qty 0.3

## 2019-10-03 MED ORDER — AMOXICILLIN-POT CLAVULANATE 875-125 MG PO TABS
1.0000 | ORAL_TABLET | Freq: Two times a day (BID) | ORAL | Status: DC
  Administered 2019-10-03 – 2019-10-04 (×3): 1 via ORAL
  Filled 2019-10-03 (×3): qty 1

## 2019-10-03 NOTE — NC FL2 (Signed)
[place] MEDICAID FL2 LEVEL OF CARE SCREENING TOOL     IDENTIFICATION  Patient Name: Christian Burns Birthdate: 06/13/1930 Sex: male Admission Date (Current Location): 09/28/2019  County and Medicaid Number:  [place]   Facility and Address:  [facility],  [address], [place] [address]      Provider Number: [id number]  Attending Physician Name and Address:  Lama, Gagan S, MD  Relative Name and Phone Number:       Current Level of Care: Hospital Recommended Level of Care: Skilled Nursing Facility Prior Approval Number:    Date Approved/Denied:   PASRR Number: [id number]  Discharge Plan: SNF    Current Diagnoses: Patient Active Problem List   Diagnosis Date Noted  . SIRS (systemic inflammatory response syndrome) (HCC) 09/28/2019  . Multifocal pneumonia 09/28/2019  . Anemia associated with chemotherapy 09/28/2019  . Protein-calorie malnutrition, severe 06/24/2019  . Leukopenia due to antineoplastic chemotherapy (HCC) 06/23/2019  . Nausea 06/23/2019  . Diarrhea 06/23/2019  . CKD (chronic kidney disease), stage III 06/23/2019  . HCAP (healthcare-associated pneumonia) 06/22/2019  . Hyperlipidemia LDL goal <70 04/20/2019  . Essential hypertension 04/20/2019  . Advanced age 04/20/2019  . Stroke (HCC) 04/19/2019  . Community acquired pneumonia 03/03/2019  . AKI (acute kidney injury) (HCC) 03/03/2019  . Normochromic normocytic anemia 03/03/2019  . Goals of care, counseling/discussion 02/04/2019  . Acute osteomyelitis of jaw 11/26/2018  . Bacteremia   . 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 Sepsis due to undetermined organism (HCC)   . Left lower lobe pneumonia 10/06/2018  . Right hip pain 09/01/2018  . Iron deficiency anemia secondary to inadequate dietary iron intake 07/18/2016  . Multiple myeloma in relapse (HCC) 04/05/2016  . 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 Multiple myeloma in remission (HCC) 09/08/2015  .  Myeloma (HCC) 08/23/2011    Orientation RESPIRATION BLADDER Height & Weight     Self, Time, Situation, Place  O2(1L) Continent Weight: 140 lb (63.5 kg) Height:  6' (182.9 cm)  BEHAVIORAL SYMPTOMS/MOOD NEUROLOGICAL BOWEL NUTRITION STATUS      Continent Diet(See dc summary)  AMBULATORY STATUS COMMUNICATION OF NEEDS Skin   Extensive Assist Verbally Normal                       Personal Care Assistance Level of Assistance  Bathing, Feeding, Dressing Bathing Assistance: Limited assistance Feeding assistance: Independent Dressing Assistance: Limited assistance     Functional Limitations Info  Sight, Hearing, Speech Sight Info: Impaired Hearing Info: Impaired Speech Info: Adequate    SPECIAL CARE FACTORS FREQUENCY  PT (By licensed PT)     PT Frequency: 5x/week              Contractures Contractures Info: Not present    Additional Factors Info  Code Status, Allergies Code Status Info: DNR Allergies Info: Sulfa Antibiotics, Sulfasalazine           Current Medications (10/03/2019):  This is the current hospital active medication list Current Facility-Administered Medications  Medication Dose Route Frequency Provider Last Rate Last Admin  . acetaminophen (TYLENOL) tablet 650 mg  650 mg Oral Q6H PRN Lama, Gagan S, MD   650 mg at 10/03/19 0938   Or  . acetaminophen (TYLENOL) suppository 650 mg  650 mg Rectal Q6H PRN Lama, Gagan S, MD      . amLODipine (NORVASC) tablet 5 mg  5 mg Oral Daily Lama, Gagan S, MD   5 mg at 10/03/19   5465  . amoxicillin-clavulanate (AUGMENTIN) 875-125 MG per tablet 1 tablet  1 tablet Oral Q12H Darrick Meigs, Marge Duncans, MD      . Chlorhexidine Gluconate Cloth 2 % PADS 6 each  6 each Topical Daily Oswald Hillock, MD   6 each at 10/02/19 1021  . dronabinol (MARINOL) capsule 5 mg  5 mg Oral BID AC Oswald Hillock, MD   5 mg at 10/02/19 1653  . enoxaparin (LOVENOX) injection 40 mg  40 mg Subcutaneous Q24H Adrian Saran, [place]   40 mg at 10/02/19 2058  .  finasteride (PROSCAR) tablet 5 mg  5 mg Oral Daily Oswald Hillock, MD   5 mg at 10/03/19 6812  . fluconazole (DIFLUCAN) tablet 200 mg  200 mg Oral Daily Oswald Hillock, MD   200 mg at 10/03/19 7517  . hydrALAZINE (APRESOLINE) injection 10 mg  10 mg Intravenous Q4H PRN Oswald Hillock, MD      . LORazepam (ATIVAN) tablet 0.5 mg  0.5 mg Oral Q6H PRN Oswald Hillock, MD      . MEDLINE mouth rinse  15 mL Mouth Rinse BID Oswald Hillock, MD   15 mL at 10/03/19 0940  . ondansetron (ZOFRAN) tablet 4 mg  4 mg Oral Q6H PRN Oswald Hillock, MD   4 mg at 10/02/19 0841   Or  . ondansetron (ZOFRAN) injection 4 mg  4 mg Intravenous Q6H PRN Oswald Hillock, MD      . pantoprazole (PROTONIX) EC tablet 40 mg  40 mg Oral BID Oswald Hillock, MD   40 mg at 10/03/19 0017  . predniSONE (DELTASONE) tablet 20 mg  20 mg Oral Q breakfast Oswald Hillock, MD   20 mg at 10/03/19 4944  . pyridOXINE (VITAMIN B-6) tablet 100 mg  100 mg Oral Daily Oswald Hillock, MD   100 mg at 10/03/19 9675  . traMADol (ULTRAM) tablet 50 mg  50 mg Oral Q8H PRN Oswald Hillock, MD      . Vitamin D (Ergocalciferol) (DRISDOL) capsule 50,000 Units  50,000 Units Oral Q Kenney Houseman, Marge Duncans, MD   50,000 Units at 10/03/19 9163     Discharge Medications: Please see discharge summary for a list of discharge medications.  Relevant Imaging Results:  Relevant Lab Results:   Additional Information ssn: [id number]  Servando Snare, LCSW

## 2019-10-03 NOTE — TOC Initial Note (Addendum)
Transition of Care [facility]) - Initial/Assessment Note    Patient Details  Name: Christian Burns MRN: [id number] Date of Birth: 1929-09-29  Transition of Care [facility]) CM/SW Contact:    Servando Snare, LCSW Phone Number: 10/03/2019, 10:08 AM  Clinical Narrative:            Patient is from home with spouse. According to patients son and Chauncey Reading, [name], patient began using a rolling walker a few months ago to assit with ambulation. Patient is independent in all other ADLs. Patient stopped driving six months ago. Patient and spouse live in independent living. Patients spouse is less mobile. Patient does not have Legal guardian.   Patient and family prefer return home at dc, but agreeable to SNF if that is the only option. [name] agreeable to fax out to SNF.        Expected Discharge Plan: Skilled Nursing Facility Barriers to Discharge: Continued Medical Work up   Patient Goals and CMS Choice Patient states their goals for this hospitalization and ongoing recovery are:: Retrun home with spouse      Expected Discharge Plan and Services Expected Discharge Plan: [place]       Living arrangements for the past 2 months: [place]                                      Prior Living Arrangements/Services Living arrangements for the past 2 months: [place] Lives with:: Spouse Patient language and need for interpreter reviewed:: No Do you feel safe going back to the place where you live?: Yes      Need for Family Participation in Patient Care: Yes (Comment) Care giver support system in place?: Yes (comment) Current home services: DME Criminal Activity/Legal Involvement Pertinent to Current Situation/Hospitalization: No - Comment as needed  Activities of Daily Living Home Assistive Devices/Equipment: Bedside commode/3-in-1, Grab bars around toilet, Grab bars in shower, Hand-held shower hose, Cane (specify quad or straight), Walker (specify  type)(single point cane, front wheeled walker) ADL Screening (condition at time of admission) Patient's cognitive ability adequate to safely complete daily activities?: Yes Is the patient deaf or have difficulty hearing?: No Does the patient have difficulty seeing, even when wearing glasses/contacts?: No Does the patient have difficulty concentrating, remembering, or making decisions?: No Patient able to express need for assistance with ADLs?: Yes Does the patient have difficulty dressing or bathing?: Yes Independently performs ADLs?: No Communication: Independent Dressing (OT): Dependent Is this a change from baseline?: Change from baseline, expected to last >3 days Grooming: Dependent Is this a change from baseline?: Change from baseline, expected to last >3 days Feeding: Dependent Is this a change from baseline?: Change from baseline, expected to last >3 days Bathing: Dependent Is this a change from baseline?: Change from baseline, expected to last >3 days Toileting: Dependent Is this a change from baseline?: Change from baseline, expected to last >3days In/Out Bed: Dependent Is this a change from baseline?: Change from baseline, expected to last >3 days Walks in Home: Dependent Is this a change from baseline?: Change from baseline, expected to last >3 days Does the patient have difficulty walking or climbing stairs?: Yes(secondary to weakness and shortness of breath) Weakness of Legs: Both Weakness of Arms/Hands: Both  Permission Sought/Granted Permission sought to share information with : Family Supports Permission granted to share information with : Yes, Verbal Permission Granted  Share Information with  NAME: [name] (Son and POA), Romie Minus ( Spouse)           Emotional Assessment Appearance:: Appears stated age Attitude/Demeanor/Rapport: Unable to Assess Affect (typically observed): Pleasant Orientation: : Oriented to Self, Oriented to Place, Oriented to  Time, Oriented to  Situation Alcohol / Substance Use: Not Applicable Psych Involvement: No (comment)  Admission diagnosis:  Renal insufficiency [N28.9] Chemotherapy-induced neutropenia (HCC) [D70.1, T45.1X5A] SIRS (systemic inflammatory response syndrome) (HCC) [R65.10] Normochromic normocytic anemia [D64.9] Sepsis due to undetermined organism ([place]) [A41.9] Multifocal pneumonia [J18.9] Community acquired pneumonia, unspecified laterality [J18.9] Person under investigation for COVID-19 [Z20.822] Patient Active Problem List   Diagnosis Date Noted  . SIRS (systemic inflammatory response syndrome) ([place]) 09/28/2019  . Multifocal pneumonia 09/28/2019  . Anemia associated with chemotherapy 09/28/2019  . Protein-calorie malnutrition, severe 06/24/2019  . Leukopenia due to antineoplastic chemotherapy ([place]) 06/23/2019  . Nausea 06/23/2019  . Diarrhea 06/23/2019  . CKD (chronic kidney disease), stage III 06/23/2019  . HCAP (healthcare-associated pneumonia) 06/22/2019  . Hyperlipidemia LDL goal <70 04/20/2019  . Essential hypertension 04/20/2019  . Advanced age 33/03/2019  . Stroke ([place]) 04/19/2019  . Community acquired pneumonia 03/03/2019  . AKI (acute kidney injury) ([place]) 03/03/2019  . Normochromic normocytic anemia 03/03/2019  . Goals of care, counseling/discussion 02/04/2019  . Acute osteomyelitis of jaw 11/26/2018  . Bacteremia   . 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 Sepsis due to undetermined organism ([place])   . Left lower lobe pneumonia 10/06/2018  . Right hip pain 09/01/2018  . Iron deficiency anemia secondary to inadequate dietary iron intake 07/18/2016  . Multiple myeloma in relapse ([place]) 04/05/2016  . 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 Multiple myeloma in remission ([place]) 09/08/2015  . Myeloma ([place]) 08/23/2011   PCP:  Leanna Battles, MD Pharmacy:   [facility] - [place], [place]  [address] Phone: [phone] Fax: [phone] [place], [place] - [address] [place] [address] Phone: [phone] Fax: [phone]     Social Determinants of Health (SDOH) Interventions    Readmission Risk Interventions Readmission Risk Prevention Plan 10/03/2019  Transportation Screening Complete  HRI or Home Care Consult Complete  SW Recovery Care/Counseling Consult Complete  Skilled Nursing Facility Complete  Some recent data might be hidden

## 2019-10-03 NOTE — Progress Notes (Signed)
[facility]  PROGRESS NOTE  Christian Burns IRW:[id number] DOB: 1930-03-10 DOA: 09/28/2019 PCP: Leanna Battles, MD   Brief HPI:   86 year old male with history of multiple myeloma status post chemo therapy, blood transfusions, last transfusion was last week, essential hypertension, chronic osteomyelitis of the jaw, stage III CKD, history of stroke in the past came to ED with generalized weakness, fever, cough and shortness of breath.  Covid 19 test was negative.    Subjective   Patient seen and examined, complains of minimal pain in the left rib cage.  But states that it is tolerable.  He is currently off oxygen.   Assessment/Plan:     1. Acute respiratory failure with hypoxia-secondary to multifocal pneumonia, patient started on vancomycin, ceftriaxone, Zithromax.  MRSA PCR is negative, vancomycin was discontinued.  Patient is now off oxygen.    Repeat chest x-ray shows patchy bilateral airspace disease.  Patient completed 5 days of treatment with ceftriaxone and Zithromax in the hospital. 2. Symptomatic anemia- patient presented with hemoglobin of 6.9, s/p 2 units PRBC.  Hemoglobin is up to 9.8. 3. 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  Called and discussed with ID Dr. Drucilla Schmidt, he recommends to start patient on Augmentin 1 tablet p.o. twice daily for 9 more days to complete 2 weeks of therapy.  Patient has been on chronic suppressive therapy on penicillin for chronic osteomyelitis of the jaw.  He can restart taking his penicillin after 9 days of Augmentin.  4. Hypertension-patient is currently on amlodipine, benazepril is on hold due to worsening renal function.  Blood pressure is stable. 5. CKD stage III-patient baseline creatinine around 1.6 as of August 31, 2019.  He presented with creatinine of 1.86, creatinine worsened to 2.12, after he received 1 dose of Lasix.  Creatinine is down to 1.70 today.      6. Pancytopenia-likely from chemotherapy.  Follow CBC in a.m.  Dr. Marin Olp is following. 7. Multiple myeloma-patient followed by Dr. Marin Olp.  Dr. Marin Olp is following patient in the hospital. 8. History of stroke-continue aspirin 9. Chronic osteomyelitis of the jaw-patient is on suppressive therapy with penicillin.  Penicillin is on hold as patient is  currently started on Augmentin as above for 9 more days.  Restart taking penicillin on 10/12/2019.    SpO2: 92 % O2 Flow Rate (L/min): 2 L/min   COVID-19 Labs  No results for input(s): DDIMER, FERRITIN, LDH, CRP in the last 72 hours.  Lab Results  Component Value Date   SARSCOV2NAA NEGATIVE 09/28/2019   SARSCOV2NAA RESULT:  NEGATIVE 09/15/2019   SARSCOV2NAA NEGATIVE 06/22/2019   [place] NEGATIVE 04/19/2019     CBG: No results for input(s): GLUCAP in the last 168 hours.  CBC: Recent Labs  Lab 09/28/19 0335 09/29/19 0223 09/30/19 0217 10/01/19 0222  WBC 2.7*  --  4.1 3.2*  NEUTROABS 2.3  --   --   --   HGB 6.9* 8.6* 10.1* 9.8*  HCT 20.8* 26.7* 30.1* 30.3*  MCV 97.2  --  93.5 93.8  PLT 105*  --  110* 108*    Basic Metabolic Panel: Recent Labs  Lab 09/28/19 0335 09/28/19 0335 09/29/19 0148 09/30/19 0217 10/01/19 0222 10/02/19 0501 10/03/19 0506  NA 137  --   --  137 138  --   --   K 3.6  --   --  3.4* 3.9  --   --   CL 105  --   --  104 106  --   --  CO2 22  --   --  23 24  --   --   GLUCOSE 117*  --   --  114* 98  --   --   BUN 32*  --   --  40* 37*  --   --   CREATININE 1.84*   < > 2.12* 2.03* 1.72* 1.51* 1.70*  CALCIUM 9.0  --   --  8.6* 8.7*  --   --    < > = values in this interval not displayed.     Liver Function Tests: Recent Labs  Lab 09/28/19 0335 09/30/19 0217 10/01/19 0222  AST 46* 37 31  ALT 17 18 17   ALKPHOS 43 47 49  BILITOT 1.2 0.4 0.7  PROT 4.8* 4.9* 4.7*  ALBUMIN 2.1* 2.0* 1.9*    DVT prophylaxis: Lovenox  Code Status: Full code  Family Communication: No family at  bedside  Disposition Plan: PT evaluation has been obtained, recommend skilled nursing facility.  Patient is clinically improving, he can be discharged to skilled nursing facility when bed becomes available.      Scheduled medications:  . amLODipine  5 mg Oral Daily  . Chlorhexidine Gluconate Cloth  6 each Topical Daily  . dronabinol  5 mg Oral BID AC  . enoxaparin (LOVENOX) injection  40 mg Subcutaneous Q24H  . finasteride  5 mg Oral Daily  . fluconazole  200 mg Oral Daily  . mouth rinse  15 mL Mouth Rinse BID  . pantoprazole  40 mg Oral BID  . penicillin v potassium  500 mg Oral Q6H  . predniSONE  20 mg Oral Q breakfast  . pyridOXINE  100 mg Oral Daily  . Vitamin D (Ergocalciferol)  50,000 Units Oral Q Sun    Consultants:  Oncology  Procedures:    Antibiotics:   Anti-infectives (From admission, onward)   Start     Dose/Rate Route Frequency Ordered Stop   10/03/19 0600  penicillin v potassium (VEETID) tablet 500 mg     500 mg Oral Every 6 hours 09/29/19 0836     09/30/19 2330  metroNIDAZOLE (FLAGYL) IVPB 500 mg     500 mg 100 mL/hr over 60 Minutes Intravenous  Once 09/30/19 2321 10/01/19 0106   09/30/19 0600  vancomycin (VANCOREADY) IVPB 1250 mg/250 mL  Status:  Discontinued     1,250 mg 166.7 mL/hr over 90 Minutes Intravenous Every 48 hours 09/28/19 0722 09/29/19 1008   09/29/19 1000  azithromycin (ZITHROMAX) tablet 500 mg     500 mg Oral Daily 09/29/19 0845 10/02/19 1020   09/28/19 1600  azithromycin (ZITHROMAX) 500 mg in sodium chloride 0.9 % 250 mL IVPB  Status:  Discontinued     500 mg 250 mL/hr over 60 Minutes Intravenous Every 24 hours 09/28/19 1454 09/29/19 0845   09/28/19 1200  fluconazole (DIFLUCAN) tablet 200 mg    Note to Pharmacy: Take 400 mg on day one, then 200 mg daily for three weeks.     200 mg Oral Daily 09/28/19 0701     09/28/19 1200  cefTRIAXone (ROCEPHIN) 2 g in sodium chloride 0.9 % 100 mL IVPB     2 g 200 mL/hr over 30 Minutes Intravenous  Every 24 hours 09/28/19 0703 10/02/19 1213   09/28/19 0800  azithromycin (ZITHROMAX) 500 mg in sodium chloride 0.9 % 250 mL IVPB  Status:  Discontinued     500 mg 250 mL/hr over 60 Minutes Intravenous Every 24 hours 09/28/19 0703 09/28/19 1454  09/28/19 0715  penicillin v potassium (VEETID) tablet 500 mg  Status:  Discontinued     500 mg Oral Every 6 hours 09/28/19 0704 09/29/19 0816   09/28/19 0400  vancomycin (VANCOREADY) IVPB 1500 mg/300 mL     1,500 mg 150 mL/hr over 120 Minutes Intravenous  Once 09/28/19 0326 09/28/19 1150   09/28/19 0330  ceFEPIme (MAXIPIME) 2 g in sodium chloride 0.9 % 100 mL IVPB     2 g 200 mL/hr over 30 Minutes Intravenous  Once 09/28/19 0319 09/28/19 0520   09/28/19 0330  metroNIDAZOLE (FLAGYL) IVPB 500 mg     500 mg 100 mL/hr over 60 Minutes Intravenous  Once 09/28/19 0319 09/28/19 0546   09/28/19 0330  vancomycin (VANCOCIN) IVPB 1000 mg/200 mL premix  Status:  Discontinued     1,000 mg 200 mL/hr over 60 Minutes Intravenous  Once 09/28/19 0319 09/28/19 0326       Objective   Vitals:   10/02/19 1000 10/02/19 1308 10/02/19 2031 10/03/19 0512  BP:  118/70 99/69 (!) 143/81  Pulse:  88 73 79  Resp:  15 15 16   Temp:  98.8 F (37.1 C) 97.8 F (36.6 C) 97.8 F (36.6 C)  TempSrc:  Oral Oral Oral  SpO2: 93% 91% 95% 92%  Weight:      Height:        Intake/Output Summary (Last 24 hours) at 10/03/2019 1018 Last data filed at 10/02/2019 2200 Gross per 24 hour  Intake 120 ml  Output 300 ml  Net -180 ml    02/19 1901 - 02/21 0700 In: 480 [P.O.:480] Out: 1000 [Urine:1000]  Filed Weights   09/28/19 0314  Weight: 63.5 kg    Physical Examination:  General-appears in no acute distress Heart-S1-S2, regular, no murmur auscultated Lungs-clear to auscultation bilaterally, no wheezing or crackles auscultated Abdomen-soft, nontender, no organomegaly Extremities-no edema in the lower extremities Neuro-alert, oriented x3, no focal deficit noted   Data  Reviewed:   Recent Results (from the past 240 hour(s))  Blood Culture (routine x 2)     Status: None (Preliminary result)   Collection Time: 09/28/19  4:00 AM   Specimen: BLOOD  Result Value Ref Range Status   Specimen Description BLOOD BLOOD LEFT HAND  Final   Special Requests   Final    BOTTLES DRAWN AEROBIC AND ANAEROBIC Blood Culture results may not be optimal due to an excessive volume of blood received in culture bottles Performed at [facility], [place] [address] [place], [place] [address]    Culture NO GROWTH 4 DAYS  Final   Report Status PENDING  Incomplete  Blood Culture (routine x 2)     Status: Abnormal   Collection Time: 09/28/19  4:00 AM   Specimen: BLOOD  Result Value Ref Range Status   Specimen Description   Final    BLOOD LEFT ANTECUBITAL Performed at [place] [address], [place],  [address]    Special Requests   Final    BOTTLES DRAWN AEROBIC AND ANAEROBIC Blood Culture adequate volume Performed at [place] [address], [place],  [address]    Culture  Setup Time   Final    ANAEROBIC BOTTLE ONLY GRAM POSITIVE RODS CRITICAL RESULT CALLED TO, READ BACK BY AND VERIFIED WITH: B GREEN PHARMD 09/30/19 2256 JDW Performed at [facility], [place] [address], [place],  [address]    Culture CLOSTRIDIUM CLOSTRIDIOFORME (A)  Final   Report  Status 10/03/2019 FINAL  Final  SARS CORONAVIRUS 2 (TAT 6-24 HRS) Nasopharyngeal Nasopharyngeal Swab     Status: None   Collection Time: 09/28/19  5:00 AM   Specimen: Nasopharyngeal Swab  Result Value Ref Range Status   SARS Coronavirus 2 NEGATIVE NEGATIVE Final    Comment: (NOTE) SARS-CoV-2 target nucleic acids are NOT DETECTED. The SARS-CoV-2 RNA is generally detectable in upper and lower respiratory specimens during the acute phase of infection. Negative results do not preclude SARS-CoV-2 infection, do not rule out co-infections with  other pathogens, and should not be used as the sole basis for treatment or other patient management decisions. Negative results must be combined with clinical observations, patient history, and epidemiological information. The expected result is Negative. Fact Sheet for Patients: SugarRoll.be Fact Sheet for Healthcare Providers: https://www.woods-mathews.com/ This test is not yet approved or cleared by the [place] FDA and  has been authorized for detection and/or diagnosis of SARS-CoV-2 by FDA under an Emergency Use Authorization (EUA). This EUA will remain  in effect (meaning this test can be used) for the duration of the COVID-19 declaration under Section 56 4(b)(1) of the Act, 21 U.S.C. section 360bbb-3(b)(1), unless the authorization is terminated or revoked sooner. Performed at [facility], [place] [address], [place], [place] [address]   MRSA PCR Screening     Status: None   Collection Time: 09/28/19  4:12 PM   Specimen: Nasal Mucosa; Nasopharyngeal  Result Value Ref Range Status   MRSA by PCR NEGATIVE NEGATIVE Final    Comment:        The GeneXpert MRSA Assay (FDA approved for NASAL specimens only), is one component of a comprehensive MRSA colonization surveillance program. It is not intended to diagnose MRSA infection nor to guide or monitor treatment for MRSA infections. Performed at [facility], [place] [address] [place], [place] [address]     No results for input(s): LIPASE, AMYLASE in the last 168 hours. No results for input(s): AMMONIA in the last 168 hours.  Cardiac Enzymes: No results for input(s): CKTOTAL, CKMB, CKMBINDEX, TROPONINI in the last 168 hours. BNP (last 3 results) Recent Labs    10/06/18 2142  BNP 109.5*      Admission status: Inpatient: Based on patients clinical presentation and evaluation of above clinical data, I have made determination that patient meets Inpatient  criteria at this time.   Oswald Hillock   [facility] If 7PM-7AM, please contact night-coverage at [email or web address], Office  [phone]  password [email or web address]  10/03/2019, 10:18 AM  LOS: 5 days

## 2019-10-04 DIAGNOSIS — R0902 Hypoxemia: Secondary | ICD-10-CM

## 2019-10-04 LAB — CBC WITH DIFFERENTIAL/PLATELET
Abs Immature Granulocytes: 0.2 10*3/uL — ABNORMAL HIGH (ref 0.00–0.07)
Band Neutrophils: 21 %
Basophils Absolute: 0 10*3/uL (ref 0.0–0.1)
Basophils Relative: 0 %
Eosinophils Absolute: 0 10*3/uL (ref 0.0–0.5)
Eosinophils Relative: 0 %
HCT: 34.4 % — ABNORMAL LOW (ref 39.0–52.0)
Hemoglobin: 11.2 g/dL — ABNORMAL LOW (ref 13.0–17.0)
Lymphocytes Relative: 2 %
Lymphs Abs: 0.1 10*3/uL — ABNORMAL LOW (ref 0.7–4.0)
MCH: 30.9 pg (ref 26.0–34.0)
MCHC: 32.6 g/dL (ref 30.0–36.0)
MCV: 94.8 fL (ref 80.0–100.0)
Metamyelocytes Relative: 3 %
Monocytes Absolute: 0.2 10*3/uL (ref 0.1–1.0)
Monocytes Relative: 5 %
Myelocytes: 2 %
Neutro Abs: 2.8 10*3/uL (ref 1.7–7.7)
Neutrophils Relative %: 67 %
Platelets: 115 10*3/uL — ABNORMAL LOW (ref 150–400)
RBC: 3.63 MIL/uL — ABNORMAL LOW (ref 4.22–5.81)
RDW: 15.2 % (ref 11.5–15.5)
WBC Morphology: INCREASED
WBC: 3.2 10*3/uL — ABNORMAL LOW (ref 4.0–10.5)
nRBC: 0.6 % — ABNORMAL HIGH (ref 0.0–0.2)

## 2019-10-04 LAB — COMPREHENSIVE METABOLIC PANEL
ALT: 19 U/L (ref 0–44)
AST: 25 U/L (ref 15–41)
Albumin: 1.9 g/dL — ABNORMAL LOW (ref 3.5–5.0)
Alkaline Phosphatase: 48 U/L (ref 38–126)
Anion gap: 8 (ref 5–15)
BUN: 35 mg/dL — ABNORMAL HIGH (ref 8–23)
CO2: 25 mmol/L (ref 22–32)
Calcium: 9.4 mg/dL (ref 8.9–10.3)
Chloride: 104 mmol/L (ref 98–111)
Creatinine, Ser: 1.68 mg/dL — ABNORMAL HIGH (ref 0.61–1.24)
GFR calc Af Amer: 42 mL/min — ABNORMAL LOW (ref >60)
GFR calc non Af Amer: 36 mL/min — ABNORMAL LOW (ref >60)
Glucose, Bld: 91 mg/dL (ref 70–99)
Potassium: 3.8 mmol/L (ref 3.5–5.1)
Sodium: 137 mmol/L (ref 135–145)
Total Bilirubin: 0.7 mg/dL (ref 0.3–1.2)
Total Protein: 5 g/dL — ABNORMAL LOW (ref 6.5–8.1)

## 2019-10-04 LAB — CREATININE, SERUM
Creatinine, Ser: 1.62 mg/dL — ABNORMAL HIGH (ref 0.61–1.24)
GFR calc Af Amer: 44 mL/min — ABNORMAL LOW (ref >60)
GFR calc non Af Amer: 38 mL/min — ABNORMAL LOW (ref >60)

## 2019-10-04 MED ORDER — IMMUNE GLOBULIN (HUMAN) 20 GM/200ML IV SOLN
40.0000 g | INTRAVENOUS | Status: AC
  Administered 2019-10-04: 40 g via INTRAVENOUS
  Filled 2019-10-04: qty 400

## 2019-10-04 MED ORDER — TRAMADOL HCL 50 MG PO TABS: 50.0000 mg | ORAL_TABLET | Freq: Three times a day (TID) | ORAL | 0 refills | Status: DC | PRN

## 2019-10-04 MED ORDER — GUAIFENESIN ER 600 MG PO TB12: 600.0000 mg | ORAL_TABLET | Freq: Two times a day (BID) | ORAL | 0 refills | Status: AC

## 2019-10-04 MED ORDER — AMOXICILLIN-POT CLAVULANATE 875-125 MG PO TABS: 1.0000 | ORAL_TABLET | Freq: Two times a day (BID) | ORAL | 0 refills | Status: AC

## 2019-10-04 MED ORDER — ONDANSETRON HCL 4 MG PO TABS: 4.0000 mg | ORAL_TABLET | Freq: Four times a day (QID) | ORAL | 0 refills | Status: DC | PRN

## 2019-10-04 MED ORDER — PENICILLIN V POTASSIUM 500 MG PO TABS: 500.0000 mg | ORAL_TABLET | Freq: Four times a day (QID) | ORAL | Status: DC

## 2019-10-04 MED ORDER — GUAIFENESIN ER 600 MG PO TB12
600.0000 mg | ORAL_TABLET | Freq: Two times a day (BID) | ORAL | Status: DC
  Administered 2019-10-04: 600 mg via ORAL
  Filled 2019-10-04: qty 1

## 2019-10-04 NOTE — TOC Transition Note (Signed)
Transition of Care [facility]) - CM/SW Discharge Note   Patient Details  Name: Christian Burns MRN: [id number] Date of Birth: October 16, 1929  [facility]) CM/SW Contact:  Dessa Phi, RN Phone Number: 10/04/2019, 1:02 PM   Clinical Narrative:  Going to rm [place] @ [facility] [place] aware of PTAR nurse to call report [phone]. No further CM needs.     Final next level of care: Skilled Nursing Facility Barriers to Discharge: Other (comment)(awaiting if bed available)   Patient Goals and CMS Choice Patient states their goals for this hospitalization and ongoing recovery are:: [place] home with spouse      Discharge Placement              Patient chooses bed at: [facility]                                     Social Determinants of Health (SDOH) Interventions     Readmission Risk Interventions Readmission Risk Prevention Plan 10/03/2019  Transportation Screening Complete  HRI or Home Care Consult Complete  SW Recovery Care/Counseling Consult Complete  Skilled Nursing Facility Complete  Some recent data might be hidden

## 2019-10-04 NOTE — TOC Progression Note (Signed)
Transition of Care [facility]) - Progression Note    Patient Details  Name: Christian Burns MRN: [id number] Date of Birth: 1930-01-13  Transition of Care [facility]) CM/SW Contact  Iza Preston, Juliann Pulse, RN Phone Number: 10/04/2019, 12:30 PM  Clinical Narrative:  Per Dr. Marin Olp nurse Vanessa-chemo on 3/17 @8 :30a,& 3/18 @ 10a. SNF-rep Freda Munro @ Facility will let me know.    Expected Discharge Plan: Skilled Nursing Facility Barriers to Discharge: Other (comment)(awaiting if bed available)  Expected Discharge Plan and Services Expected Discharge Plan: [place] arrangements for the past 2 months: [place]                                       Social Determinants of Health (SDOH) Interventions    Readmission Risk Interventions Readmission Risk Prevention Plan 10/03/2019  Transportation Screening Complete  HRI or Home Care Consult Complete  SW Recovery Care/Counseling Consult Complete  Skilled Nursing Facility Complete  Some recent data might be hidden

## 2019-10-04 NOTE — Progress Notes (Signed)
Christian Burns was moved out of the ICU over the weekend.  He is weak.  He may need to go to rehab before he can actually go back home.  I am not sure if there is a rehab section at his assisted living facility.  I will go ahead and give him a dose of IVIG.  His IgG level is trending downward.  I do think would be a bad idea to try to give him IVIG to boost his immune system a little bit.  No labs are back yet today.  His appetite might be a little bit better.  He has had no fever.  He has had some increase in mucus.  I will try him on some Mucinex to see if this can help.  His vital signs all look pretty good.  He is afebrile with a temperature 98.1.  Pulse 75.  Blood pressure 155/80.  His head exam shows no ocular or oral lesions.  There are no palpable lymph nodes in the neck.  Lungs are with good air movement bilaterally.  There is some crackles bilaterally.  No wheezes are heard.  Cardiac exam regular rate and rhythm.  Abdomen is soft.  Bowel sounds are present.  There is no guarding or rebound tenderness.  Extremities shows no clubbing, cyanosis or edema.  Neurological exam is nonfocal.  Hopefully, [name] will be able to go home at some point.  I know that his immune system is somewhat compromised because of the myeloma.  Again, IVIG hopefully should help this.  I know that he is getting fantastic care from all the staff on [address].  I appreciate all the efforts.  Lattie Haw, MD  [place] 1:15

## 2019-10-04 NOTE — TOC Progression Note (Signed)
Transition of Care [facility]) - Progression Note    Patient Details  Name: Christian Burns MRN: [id number] Date of Birth: 1929/09/05  Transition of Care [facility]) CM/SW Contact  Deandrew Hoecker, Juliann Pulse, RN Phone Number: 10/04/2019, 11:35 AM  Clinical Narrative:  [name] [id number] chose [facility]-checking if bed available.     Expected Discharge Plan: Skilled Nursing Facility Barriers to Discharge: Other (comment)(awaiting if bed available)  Expected Discharge Plan and Services Expected Discharge Plan: [place] arrangements for the past 2 months: [place]                                       Social Determinants of Health (SDOH) Interventions    Readmission Risk Interventions Readmission Risk Prevention Plan 10/03/2019  Transportation Screening Complete  HRI or Home Care Consult Complete  SW Recovery Care/Counseling Consult Complete  Skilled Nursing Facility Complete  Some recent data might be hidden

## 2019-10-04 NOTE — Care Management Important Message (Signed)
Important Message  Patient Details IM Letter given to Dessa Phi RN Case Manger to present to the Patient Name: Christian Burns MRN: [id number] Date of Birth: 1929-11-21   Medicare Important Message Given:  Yes     Kerin Salen 10/04/2019, 12:03 PM

## 2019-10-04 NOTE — Discharge Summary (Signed)
Physician Discharge Summary  Christian Burns JKD:[id number] DOB: 09-20-1929 DOA: 09/28/2019  PCP: Leanna Battles, MD  Admit date: 09/28/2019 Discharge date: 10/04/2019  Time spent: 50  minutes  Recommendations for Outpatient Follow-up:  1. Follow-up Dr. Marin Olp as outpatient 2. Patient will be discharged on Augmentin 1 tablet p.o. twice daily for 7 more days, stop on 10/11/2019 3. Restart taking penicillin 500 mg p.o. 4 times a day from 10/12/2019 4. Patient is currently on oxygen 1 L/min.  Consider weaning off oxygen at skilled nursing facility  Discharge Diagnoses:  Active Problems:   Myeloma ([name])   Sepsis due to undetermined organism [facility])   Acute osteomyelitis of jaw   Normochromic normocytic anemia   CKD (chronic kidney disease), stage III   SIRS (systemic inflammatory response syndrome) (HCC)   Multifocal pneumonia   Anemia associated with chemotherapy   Discharge Condition: Stable  Diet recommendation: Heart healthy diet  Filed Weights   09/28/19 0314  Weight: 63.5 kg    History of present illness:  86 year old male with history of multiple myeloma status post chemo therapy, blood transfusions, last transfusion was last week, essential hypertension, chronic osteomyelitis of the jaw, stage III CKD, history of stroke in the past came to ED with generalized weakness, fever, cough and shortness of breath.  Covid 19 test was negative.    Hospital Course:  1. Acute respiratory failure with hypoxia-resolved secondary to multifocal pneumonia, patient started on vancomycin, ceftriaxone, Zithromax.  MRSA PCR is negative, vancomycin was discontinued.  Patient is now off oxygen.    Repeat chest x-ray shows patchy bilateral airspace disease.  Patient completed 5 days of treatment with ceftriaxone and Zithromax in the hospital.  Patient is still requiring 1 L/min of oxygen.  Continue to wean off oxygen as tolerated.  2. Symptomatic anemia- patient presented with hemoglobin of 6.9, s/p  2 units PRBC.  Hemoglobin is up to 11.2.  3. 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  Called and discussed with ID Dr. Drucilla Schmidt, he recommends to start patient on Augmentin 1 tablet p.o. twice daily for 9 more days to complete 2 weeks of therapy.  Patient has been on chronic suppressive therapy on penicillin for chronic osteomyelitis of the jaw.  He can restart taking his penicillin on 10/12/2019 after completing 9 days of Augmentin.   4. Hypertension-restart amlodipine/benazepril  5. CKD stage III-patient baseline creatinine around 1.6 as of August 31, 2019.  He presented with creatinine of 1.86, creatinine worsened to 2.12, after he received 1 dose of Lasix.  Creatinine is down to 1.68 today.      6. Pancytopenia-likely from chemotherapy.  Follow CBC in a.m.  Dr. Marin Olp is following.  7. Multiple myeloma-patient followed by Dr. Marin Olp.  Dr. Marin Olp is following patient in the hospital.  IVIG ordered per oncology.  Patient to follow-up with oncology as outpatient for chemotherapy.  8. History of stroke-continue aspirin  9. Chronic osteomyelitis of the jaw-patient is on suppressive therapy with penicillin.  Penicillin is on hold as patient is  currently started on Augmentin as above for 9 more days.  Restart taking penicillin on 10/12/2019.  10. Esophagitis-patient has been on Diflucan.  Continue Diflucan till 10/08/2019 and then stop. *  Procedures:  Consultations:  Oncology  Discharge Exam: Vitals:   10/04/19 1146 10/04/19 1202  BP: 129/83 137/82  Pulse: 77 69  Resp: (!) 22 19  Temp: 97.7 F (36.5 C) 98 F (36.7 C)  SpO2: 92% 98%  G Discharge Instructions   Discharge Instructions    Diet - low sodium heart healthy   Complete by: As directed    Increase activity slowly   Complete by: As directed      Allergies as of 10/04/2019      Reactions   Sulfa Antibiotics Itching   Sulfasalazine Itching       Medication List    STOP taking these medications   LORazepam 0.5 MG tablet Commonly known as: Ativan   nitrofurantoin (macrocrystal-monohydrate) 100 MG capsule Commonly known as: MACROBID   prochlorperazine 10 MG tablet Commonly known as: COMPAZINE     TAKE these medications   ALOXI IV Inject into the vein.   amLODipine-benazepril 5-10 MG capsule Commonly known as: LOTREL TAKE ONE CAPSULE EACH DAY What changed: See the new instructions.   amoxicillin-clavulanate 875-125 MG tablet Commonly known as: AUGMENTIN Take 1 tablet by mouth every 12 (twelve) hours for 7 days.   aspirin 81 MG EC tablet Take 1 tablet (81 mg total) by mouth daily.   bendamustine in sodium chloride 0.9 % 50 mL Inject into the vein once.   dextromethorphan-guaiFENesin 30-600 MG 12hr tablet Commonly known as: MUCINEX DM Take 1 tablet by mouth 2 (two) times daily as needed for cough.   dronabinol 5 MG capsule Commonly known as: MARINOL TAKE ONE CAPSULE TWICE DAILY BEFORE MEALS   finasteride 5 MG tablet Commonly known as: PROSCAR TAKE ONE TABLET EACH DAY What changed: See the new instructions.   fluconazole 200 MG tablet Commonly known as: Diflucan Take 1 tablet (200 mg total) by mouth daily for 21 days. Take 400 mg on day one, then 200 mg daily for three weeks.   guaiFENesin 600 MG 12 hr tablet Commonly known as: MUCINEX Take 1 tablet (600 mg total) by mouth 2 (two) times daily for 5 days.   multivitamin with minerals Tabs tablet Take 1 tablet by mouth daily.   ondansetron 4 MG tablet Commonly known as: ZOFRAN Take 1 tablet (4 mg total) by mouth every 6 (six) hours as needed for nausea. What changed:   medication strength  how much to take  when to take this  reasons to take this  additional instructions   pantoprazole 40 MG tablet Commonly known as: Protonix Take 1 tablet (40 mg total) by mouth 2 (two) times daily.   penicillin v potassium 500 MG tablet Commonly known as:  VEETID Take 1 tablet (500 mg total) by mouth 4 (four) times daily. Start taking on: October 12, 2019 What changed:   additional instructions  These instructions start on October 12, 2019. If you are unsure what to do until then, ask your doctor or other care provider.   polyvinyl alcohol 1.4 % ophthalmic solution Commonly known as: LIQUIFILM TEARS Place 1 drop into both eyes as needed for dry eyes.   predniSONE 20 MG tablet Commonly known as: Deltasone Take 1 tablet (20 mg total) by mouth daily with breakfast.   PROBIOTIC PO Take 1 capsule by mouth daily.   pyridOXINE 100 MG tablet Commonly known as: VITAMIN B-6 Take 100 mg by mouth daily.   traMADol 50 MG tablet Commonly known as: ULTRAM Take 1 tablet (50 mg total) by mouth every 8 (eight) hours as needed for moderate pain.   vitamin B-12 500 MCG tablet Commonly known as: CYANOCOBALAMIN Take 500 mcg by mouth daily.   Vitamin D (Ergocalciferol) 1.25 MG (50000 UNIT) Caps capsule Commonly known as: DRISDOL Take 50,000 Units by mouth  every Sunday.      Allergies  Allergen Reactions  . Sulfa Antibiotics Itching  . Sulfasalazine Itching   Contact information for after-discharge care    [facility] .   Service: Skilled Nursing Contact information: [place] [phone]               The results of significant diagnostics from this hospitalization (including imaging, microbiology, ancillary and laboratory) are listed below for reference.    Significant Diagnostic Studies: DG CHEST PORT 1 VIEW  Result Date: 09/30/2019 CLINICAL DATA:  Pneumonia EXAM: PORTABLE CHEST 1 VIEW COMPARISON:  09/28/2019 FINDINGS: Multifocal bilateral airspace opacities again noted, stable since prior study. Heart is normal size. Mild hyperinflation of the lungs. No visible effusions or pneumothorax. No acute bony abnormality. IMPRESSION: Patchy bilateral airspace disease,  unchanged since prior study. Electronically Signed   By: Rolm Baptise M.D.   On: 09/30/2019 10:22   DG CHEST PORT 1 VIEW  Result Date: 09/28/2019 CLINICAL DATA:  Hypoxia. EXAM: PORTABLE CHEST 1 VIEW COMPARISON:  Chest radiograph 09/28/2019 FINDINGS: Redemonstrated implanted loop recorder device which projects in the region of the left chest wall. Heart size within normal limits. Redemonstrated multifocal bilateral airspace disease most prominent within the right lung apex. Findings is not significant changed since prior examination performed earlier the same day. No evidence of pleural effusion or pneumothorax. No acute bony abnormality. Thoracic spondylosis. Overlying cardiac monitoring leads. IMPRESSION: Bilateral multifocal airspace disease most prominent within the right lung apex, not significantly changed since chest radiograph performed earlier the same day. Findings likely reflect multifocal pneumonia. Edema is also a consideration. Electronically Signed   By: Kellie Simmering DO   On: 09/28/2019 12:44   DG Chest Port 1 View  Result Date: 09/28/2019 CLINICAL DATA:  Fever and weakness. EXAM: PORTABLE CHEST 1 VIEW COMPARISON:  06/22/2019 FINDINGS: Implanted loop recorder projects over the left chest wall. Multifocal interstitial and airspace opacities throughout both lungs, right greater than left. Unchanged heart size and mediastinal contours. No pleural effusion or pneumothorax. Degenerative change in the spine. IMPRESSION: Bilateral interstitial and airspace opacities throughout both lungs, right greater than left, suspicious for multifocal pneumonia. Asymmetric pulmonary edema is felt less likely. Electronically Signed   By: Keith Rake M.D.   On: 09/28/2019 03:51   CUP PACEART REMOTE DEVICE CHECK  Result Date: 09/06/2019 [facility] summary report received. Battery status OK. Normal device function. No new symptom episodes, tachy episodes, brady, or pause episodes. No new AF episodes. Monthly  summary reports and ROV/PRN   Microbiology: Recent Results (from the past 240 hour(s))  Blood Culture (routine x 2)     Status: None   Collection Time: 09/28/19  4:00 AM   Specimen: BLOOD  Result Value Ref Range Status   Specimen Description   Final    BLOOD BLOOD LEFT HAND Performed at [facility] [address] [place], [place] [address]    Special Requests   Final    BOTTLES DRAWN AEROBIC AND ANAEROBIC Blood Culture results may not be optimal due to an excessive volume of blood received in culture bottles Performed at [facility] [address] [place], [place] [address]    Culture   Final    NO GROWTH 5 DAYS Performed at [facility], [place] [address], [place], [place] [address]    Report Status 10/03/2019 FINAL  Final  Blood Culture (routine x 2)  Status: Abnormal   Collection Time: 09/28/19  4:00 AM   Specimen: BLOOD  Result Value Ref Range Status   Specimen Description   Final    BLOOD LEFT ANTECUBITAL Performed at [facility] [address] [place], [place] [address]    Special Requests   Final    BOTTLES DRAWN AEROBIC AND ANAEROBIC Blood Culture adequate volume Performed at [facility] [address], [place], [place] [address]    Culture  Setup Time   Final    ANAEROBIC BOTTLE ONLY GRAM POSITIVE RODS CRITICAL RESULT CALLED TO, READ BACK BY AND VERIFIED WITH: B GREEN PHARMD 09/30/19 2256 JDW Performed at [facility], [place] [address] [place], [place] [address]    Culture CLOSTRIDIUM CLOSTRIDIOFORME (A)  Final   Report Status 10/03/2019 FINAL  Final  SARS CORONAVIRUS 2 (TAT 6-24 HRS) Nasopharyngeal Nasopharyngeal Swab     Status: None   Collection Time: 09/28/19  5:00 AM   Specimen: Nasopharyngeal Swab  Result Value Ref Range Status   SARS Coronavirus 2 NEGATIVE NEGATIVE Final    Comment: (NOTE) SARS-CoV-2 target nucleic acids are NOT DETECTED. The SARS-CoV-2 RNA  is generally detectable in upper and lower respiratory specimens during the acute phase of infection. Negative results do not preclude SARS-CoV-2 infection, do not rule out co-infections with other pathogens, and should not be used as the sole basis for treatment or other patient management decisions. Negative results must be combined with clinical observations, patient history, and epidemiological information. The expected result is Negative. Fact Sheet for Patients: SugarRoll.be Fact Sheet for Healthcare Providers: https://www.woods-mathews.com/ This test is not yet approved or cleared by the [place] FDA and  has been authorized for detection and/or diagnosis of SARS-CoV-2 by FDA under an Emergency Use Authorization (EUA). This EUA will remain  in effect (meaning this test can be used) for the duration of the COVID-19 declaration under Section 56 4(b)(1) of the Act, 21 U.S.C. section 360bbb-3(b)(1), unless the authorization is terminated or revoked sooner. Performed at [facility], [place] [address] [place], [place] [address]   MRSA PCR Screening     Status: None   Collection Time: 09/28/19  4:12 PM   Specimen: Nasal Mucosa; Nasopharyngeal  Result Value Ref Range Status   MRSA by PCR NEGATIVE NEGATIVE Final    Comment:        The GeneXpert MRSA Assay (FDA approved for NASAL specimens only), is one component of a comprehensive MRSA colonization surveillance program. It is not intended to diagnose MRSA infection nor to guide or monitor treatment for MRSA infections. Performed at [facility], [place] [address], [place], [place] [address]   SARS CORONAVIRUS 2 (TAT 6-24 HRS) Nasopharyngeal Nasopharyngeal Swab     Status: None   Collection Time: 10/03/19  8:32 AM   Specimen: Nasopharyngeal Swab  Result Value Ref Range Status   SARS Coronavirus 2 NEGATIVE NEGATIVE Final    Comment: (NOTE) SARS-CoV-2 target  nucleic acids are NOT DETECTED. The SARS-CoV-2 RNA is generally detectable in upper and lower respiratory specimens during the acute phase of infection. Negative results do not preclude SARS-CoV-2 infection, do not rule out co-infections with other pathogens, and should not be used as the sole basis for treatment or other patient management decisions. Negative results must be combined with clinical observations, patient history, and epidemiological information. The expected result is Negative. Fact Sheet for Patients: SugarRoll.be Fact Sheet for Healthcare Providers: https://www.woods-mathews.com/ This test is  not yet approved or cleared by the [place] and  has been authorized for detection and/or diagnosis of SARS-CoV-2 by FDA under an Emergency Use Authorization (EUA). This EUA will remain  in effect (meaning this test can be used) for the duration of the COVID-19 declaration under Section 56 4(b)(1) of the Act, 21 U.S.C. section 360bbb-3(b)(1), unless the authorization is terminated or revoked sooner. Performed at [facility], [place] [address], [place], [place] [address]      Labs: Basic Metabolic Panel: Recent Labs  Lab 09/28/19 0335 09/29/19 0148 09/30/19 0217 10/01/19 0222 10/02/19 0501 10/03/19 0506 10/04/19 0429  NA 137  --  137 138  --   --  137  K 3.6  --  3.4* 3.9  --   --  3.8  CL 105  --  104 106  --   --  104  CO2 22  --  23 24  --   --  25  GLUCOSE 117*  --  114* 98  --   --  91  BUN 32*  --  40* 37*  --   --  35*  CREATININE 1.84*   < > 2.03* 1.72* 1.51* 1.70* 1.68*  1.62*  CALCIUM 9.0  --  8.6* 8.7*  --   --  9.4   < > = values in this interval not displayed.   Liver Function Tests: Recent Labs  Lab 09/28/19 0335 09/30/19 0217 10/01/19 0222 10/04/19 0429  AST 46* 37 31 25  ALT 17 18 17 19   ALKPHOS 43 47 49 48  BILITOT 1.2 0.4 0.7 0.7  PROT 4.8* 4.9* 4.7* 5.0*  ALBUMIN 2.1* 2.0* 1.9* 1.9*    No results for input(s): LIPASE, AMYLASE in the last 168 hours. No results for input(s): AMMONIA in the last 168 hours. CBC: Recent Labs  Lab 09/28/19 0335 09/29/19 0223 09/30/19 0217 10/01/19 0222 10/04/19 0429  WBC 2.7*  --  4.1 3.2* 3.2*  NEUTROABS 2.3  --   --   --  2.8  HGB 6.9* 8.6* 10.1* 9.8* 11.2*  HCT 20.8* 26.7* 30.1* 30.3* 34.4*  MCV 97.2  --  93.5 93.8 94.8  PLT 105*  --  110* 108* 115*   Cardiac Enzymes: No results for input(s): CKTOTAL, CKMB, CKMBINDEX, TROPONINI in the last 168 hours. BNP: BNP (last 3 results) Recent Labs    10/06/18 2142  BNP 109.5*        Signed:  Oswald Hillock MD.  [facility] 10/04/2019, 12:47 PM

## 2019-10-04 NOTE — Progress Notes (Signed)
[facility]  PROGRESS NOTE  Christian Burns WLN:[id number] DOB: 11-Oct-1929 DOA: 09/28/2019 PCP: Leanna Battles, MD   Brief HPI:   86 year old male with history of multiple myeloma status post chemo therapy, blood transfusions, last transfusion was last week, essential hypertension, chronic osteomyelitis of the jaw, stage III CKD, history of stroke in the past came to ED with generalized weakness, fever, cough and shortness of breath.  Covid 19 test was negative.    Subjective   Patient seen and examined, he is back on oxygen.  Denies shortness of breath.  IVIG ordered per oncology this morning.   Assessment/Plan:     1. Acute respiratory failure with hypoxia-secondary to multifocal pneumonia, patient started on vancomycin, ceftriaxone, Zithromax.  MRSA PCR is negative, vancomycin was discontinued.  Patient is now off oxygen.    Repeat chest x-ray shows patchy bilateral airspace disease.  Patient completed 5 days of treatment with ceftriaxone and Zithromax in the hospital. 2. Symptomatic anemia- patient presented with hemoglobin of 6.9, s/p 2 units PRBC.  Hemoglobin is up to 9.8. 3. 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  Called and discussed with ID Dr. Drucilla Schmidt, he recommends to start patient on Augmentin 1 tablet p.o. twice daily for 9 more days to complete 2 weeks of therapy.  Patient has been on chronic suppressive therapy on penicillin for chronic osteomyelitis of the jaw.  He can restart taking his penicillin after 9 days of Augmentin.  4. Hypertension-patient is currently on amlodipine, benazepril is on hold due to worsening renal function.  Blood pressure is stable. 5. CKD stage III-patient baseline creatinine around 1.6 as of August 31, 2019.  He presented with creatinine of 1.86, creatinine worsened to 2.12, after he received 1 dose of Lasix.  Creatinine is down to 1.68 today.     6. Pancytopenia-likely from  chemotherapy.  Follow CBC in a.m.  Dr. Marin Olp is following. 7. Multiple myeloma-patient followed by Dr. Marin Olp.  Dr. Marin Olp is following patient in the hospital.  IVIG ordered per oncology 8. History of stroke-continue aspirin 9. Chronic osteomyelitis of the jaw-patient is on suppressive therapy with penicillin.  Penicillin is on hold as patient is  currently started on Augmentin as above for 9 more days.  Restart taking penicillin on 10/12/2019.    SpO2: 92 % O2 Flow Rate (L/min): 1 L/min   COVID-19 Labs  No results for input(s): DDIMER, FERRITIN, LDH, CRP in the last 72 hours.  Lab Results  Component Value Date   SARSCOV2NAA NEGATIVE 10/03/2019   [place] NEGATIVE 09/28/2019   SARSCOV2NAA RESULT:  NEGATIVE 09/15/2019   [place] NEGATIVE 06/22/2019     CBG: No results for input(s): GLUCAP in the last 168 hours.  CBC: Recent Labs  Lab 09/28/19 0335 09/29/19 0223 09/30/19 0217 10/01/19 0222 10/04/19 0429  WBC 2.7*  --  4.1 3.2* 3.2*  NEUTROABS 2.3  --   --   --  2.8  HGB 6.9* 8.6* 10.1* 9.8* 11.2*  HCT 20.8* 26.7* 30.1* 30.3* 34.4*  MCV 97.2  --  93.5 93.8 94.8  PLT 105*  --  110* 108* 115*    Basic Metabolic Panel: Recent Labs  Lab 09/28/19 0335 09/29/19 0148 09/30/19 0217 10/01/19 0222 10/02/19 0501 10/03/19 0506 10/04/19 0429  NA 137  --  137 138  --   --  137  K 3.6  --  3.4* 3.9  --   --  3.8  CL 105  --  104 106  --   --  104  CO2 22  --  23 24  --   --  25  GLUCOSE 117*  --  114* 98  --   --  91  BUN 32*  --  40* 37*  --   --  35*  CREATININE 1.84*   < > 2.03* 1.72* 1.51* 1.70* 1.68*  1.62*  CALCIUM 9.0  --  8.6* 8.7*  --   --  9.4   < > = values in this interval not displayed.     Liver Function Tests: Recent Labs  Lab 09/28/19 0335 09/30/19 0217 10/01/19 0222 10/04/19 0429  AST 46* 37 31 25  ALT _0 ALKPHOS 43 47 49 48  BILITOT 1.2 0.4 0.7 0.7  PROT 4.8* 4.9* 4.7* 5.0*  ALBUMIN 2.1* 2.0* 1.9* 1.9*    DVT  prophylaxis: Lovenox  Code Status: Full code  Family Communication: No family at bedside  Disposition Plan: PT evaluation has been obtained, recommend skilled nursing facility.  Patient is clinically improving, he can be discharged to skilled nursing facility when bed becomes available.      Scheduled medications:  . amLODipine  5 mg Oral Daily  . amoxicillin-clavulanate  1 tablet Oral Q12H  . Chlorhexidine Gluconate Cloth  6 each Topical Daily  . dronabinol  5 mg Oral BID AC  . enoxaparin (LOVENOX) injection  30 mg Subcutaneous Q24H  . finasteride  5 mg Oral Daily  . fluconazole  200 mg Oral Daily  . guaiFENesin  600 mg Oral BID  . mouth rinse  15 mL Mouth Rinse BID  . pantoprazole  40 mg Oral BID  . predniSONE  20 mg Oral Q breakfast  . pyridOXINE  100 mg Oral Daily  . Vitamin D (Ergocalciferol)  50,000 Units Oral Q Sun    Consultants:  Oncology  Procedures:    Antibiotics:   Anti-infectives (From admission, onward)   Start     Dose/Rate Route Frequency Ordered Stop   10/03/19 1030  amoxicillin-clavulanate (AUGMENTIN) 875-125 MG per tablet 1 tablet     1 tablet Oral Every 12 hours 10/03/19 1026 10/07/19 2159   10/03/19 0600  penicillin v potassium (VEETID) tablet 500 mg  Status:  Discontinued     500 mg Oral Every 6 hours 09/29/19 0836 10/03/19 1026   09/30/19 2330  metroNIDAZOLE (FLAGYL) IVPB 500 mg     500 mg 100 mL/hr over 60 Minutes Intravenous  Once 09/30/19 2321 10/01/19 0106   09/30/19 0600  vancomycin (VANCOREADY) IVPB 1250 mg/250 mL  Status:  Discontinued     1,250 mg 166.7 mL/hr over 90 Minutes Intravenous Every 48 hours 09/28/19 0722 09/29/19 1008   09/29/19 1000  azithromycin (ZITHROMAX) tablet 500 mg     500 mg Oral Daily 09/29/19 0845 10/02/19 1020   09/28/19 1600  azithromycin (ZITHROMAX) 500 mg in sodium chloride 0.9 % 250 mL IVPB  Status:  Discontinued     500 mg 250 mL/hr over 60 Minutes Intravenous Every 24 hours 09/28/19 1454 09/29/19 0845    09/28/19 1200  fluconazole (DIFLUCAN) tablet 200 mg    Note to Pharmacy: Take 400 mg on day one, then 200 mg daily for three weeks.     200 mg Oral Daily 09/28/19 0701     09/28/19 1200  cefTRIAXone (ROCEPHIN) 2 g in sodium chloride 0.9 % 100 mL IVPB     2 g 200 mL/hr over 30 Minutes Intravenous Every 24 hours 09/28/19 0703 10/02/19 1213  09/28/19 0800  azithromycin (ZITHROMAX) 500 mg in sodium chloride 0.9 % 250 mL IVPB  Status:  Discontinued     500 mg 250 mL/hr over 60 Minutes Intravenous Every 24 hours 09/28/19 0703 09/28/19 1454   09/28/19 0715  penicillin v potassium (VEETID) tablet 500 mg  Status:  Discontinued     500 mg Oral Every 6 hours 09/28/19 0704 09/29/19 0816   09/28/19 0400  vancomycin (VANCOREADY) IVPB 1500 mg/300 mL     1,500 mg 150 mL/hr over 120 Minutes Intravenous  Once 09/28/19 0326 09/28/19 1150   09/28/19 0330  ceFEPIme (MAXIPIME) 2 g in sodium chloride 0.9 % 100 mL IVPB     2 g 200 mL/hr over 30 Minutes Intravenous  Once 09/28/19 0319 09/28/19 0520   09/28/19 0330  metroNIDAZOLE (FLAGYL) IVPB 500 mg     500 mg 100 mL/hr over 60 Minutes Intravenous  Once 09/28/19 0319 09/28/19 0546   09/28/19 0330  vancomycin (VANCOCIN) IVPB 1000 mg/200 mL premix  Status:  Discontinued     1,000 mg 200 mL/hr over 60 Minutes Intravenous  Once 09/28/19 0319 09/28/19 0326       Objective   Vitals:   10/03/19 0512 10/03/19 1243 10/03/19 2129 10/04/19 0519  BP: (!) 143/81 135/78 (!) 141/80 (!) 155/80  Pulse: 79 80 65 75  Resp: 16 (!) 22 18   Temp: 97.8 F (36.6 C) 99 F (37.2 C) 97.6 F (36.4 C) 98.1 F (36.7 C)  TempSrc: Oral Oral Oral Oral  SpO2: 92% 94% 96% 92%  Weight:      Height:        Intake/Output Summary (Last 24 hours) at 10/04/2019 5329 Last data filed at 10/03/2019 1700 Gross per 24 hour  Intake 440 ml  Output 950 ml  Net -510 ml    02/20 1901 - 02/22 0700 In: 560 [P.O.:560] Out: 950 [Urine:950]  Filed Weights   09/28/19 0314  Weight: 63.5  kg    Physical Examination:  General-appears in no acute distress Heart-S1-S2, regular, no murmur auscultated Lungs-clear to auscultation bilaterally, no wheezing or crackles auscultated Abdomen-soft, nontender, no organomegaly Extremities-no edema in the lower extremities Neuro-alert, oriented x3, no focal deficit noted   Data Reviewed:   Recent Results (from the past 240 hour(s))  Blood Culture (routine x 2)     Status: None   Collection Time: 09/28/19  4:00 AM   Specimen: BLOOD  Result Value Ref Range Status   Specimen Description   Final    BLOOD BLOOD LEFT HAND Performed at [facility], [address] [place], [place] [address]    Special Requests   Final    BOTTLES DRAWN AEROBIC AND ANAEROBIC Blood Culture results may not be optimal due to an excessive volume of blood received in culture bottles Performed at [place] [address] [place], [place] [address]    Culture   Final    NO GROWTH 5 DAYS Performed at [facility], [place] [address], [place], [place] [address]    Report Status 10/03/2019 FINAL  Final  Blood Culture (routine x 2)     Status: Abnormal   Collection Time: 09/28/19  4:00 AM   Specimen: BLOOD  Result Value Ref Range Status   Specimen Description   Final    BLOOD LEFT ANTECUBITAL Performed at [facility] [address] [place], [place] [address]    Special Requests   Final    BOTTLES  DRAWN AEROBIC AND ANAEROBIC Blood Culture adequate volume Performed at [facility] [address] [place], [place] [address]    Culture  Setup Time   Final    ANAEROBIC BOTTLE ONLY GRAM POSITIVE RODS CRITICAL RESULT CALLED TO, READ BACK BY AND VERIFIED WITH: B GREEN PHARMD 09/30/19 2256 JDW Performed at [facility], [place] [address] [place], [place] [address]    Culture CLOSTRIDIUM CLOSTRIDIOFORME (A)  Final   Report Status 10/03/2019 FINAL  Final  SARS CORONAVIRUS  2 (TAT 6-24 HRS) Nasopharyngeal Nasopharyngeal Swab     Status: None   Collection Time: 09/28/19  5:00 AM   Specimen: Nasopharyngeal Swab  Result Value Ref Range Status   SARS Coronavirus 2 NEGATIVE NEGATIVE Final    Comment: (NOTE) SARS-CoV-2 target nucleic acids are NOT DETECTED. The SARS-CoV-2 RNA is generally detectable in upper and lower respiratory specimens during the acute phase of infection. Negative results do not preclude SARS-CoV-2 infection, do not rule out co-infections with other pathogens, and should not be used as the sole basis for treatment or other patient management decisions. Negative results must be combined with clinical observations, patient history, and epidemiological information. The expected result is Negative. Fact Sheet for Patients: SugarRoll.be Fact Sheet for Healthcare Providers: https://www.woods-mathews.com/ This test is not yet approved or cleared by the [place] FDA and  has been authorized for detection and/or diagnosis of SARS-CoV-2 by FDA under an Emergency Use Authorization (EUA). This EUA will remain  in effect (meaning this test can be used) for the duration of the COVID-19 declaration under Section 56 4(b)(1) of the Act, 21 U.S.C. section 360bbb-3(b)(1), unless the authorization is terminated or revoked sooner. Performed at [facility], [place] [address] [place], [place] [address]   MRSA PCR Screening     Status: None   Collection Time: 09/28/19  4:12 PM   Specimen: Nasal Mucosa; Nasopharyngeal  Result Value Ref Range Status   MRSA by PCR NEGATIVE NEGATIVE Final    Comment:        The GeneXpert MRSA Assay (FDA approved for NASAL specimens only), is one component of a comprehensive MRSA colonization surveillance program. It is not intended to diagnose MRSA infection nor to guide or monitor treatment for MRSA infections. Performed at [facility], [place] [address] [place], [place] [address]   SARS CORONAVIRUS 2 (TAT 6-24 HRS) Nasopharyngeal Nasopharyngeal Swab     Status: None   Collection Time: 10/03/19  8:32 AM   Specimen: Nasopharyngeal Swab  Result Value Ref Range Status   SARS Coronavirus 2 NEGATIVE NEGATIVE Final    Comment: (NOTE) SARS-CoV-2 target nucleic acids are NOT DETECTED. The SARS-CoV-2 RNA is generally detectable in upper and lower respiratory specimens during the acute phase of infection. Negative results do not preclude SARS-CoV-2 infection, do not rule out co-infections with other pathogens, and should not be used as the sole basis for treatment or other patient management decisions. Negative results must be combined with clinical observations, patient history, and epidemiological information. The expected result is Negative. Fact Sheet for Patients: SugarRoll.be Fact Sheet for Healthcare Providers: https://www.woods-mathews.com/ This test is not yet approved or cleared by the [place] FDA and  has been authorized for detection and/or diagnosis of SARS-CoV-2 by FDA under an Emergency Use Authorization (EUA). This EUA will remain  in effect (meaning this test can be used) for the duration of the COVID-19 declaration under Section 56 4(b)(1) of the Act, 21 U.S.C.  section 360bbb-3(b)(1), unless the authorization is terminated or revoked sooner. Performed at [facility], [place] [address] [place], [place] [address]     No results for input(s): LIPASE, AMYLASE in the last 168 hours. No results for input(s): AMMONIA in the last 168 hours.  Cardiac Enzymes: No results for input(s): CKTOTAL, CKMB, CKMBINDEX, TROPONINI in the last 168 hours. BNP (last 3 results) Recent Labs    10/06/18 2142  BNP 109.5*      Admission status: Inpatient: Based on patients clinical presentation and evaluation of above clinical data, I have made determination that patient meets Inpatient  criteria at this time.   Oswald Hillock   [facility] If 7PM-7AM, please contact night-coverage at [email or web address], Office  [phone]  password [facility]  10/04/2019, 9:29 AM  LOS: 6 days

## 2019-10-04 NOTE — TOC Progression Note (Signed)
Transition of Care [facility]) - Progression Note    Patient Details  Name: EGBERT BERNALES MRN: [id number] Date of Birth: [birth year]  [facility] of [facility]) CM/SW Contact  Kammi Hechler, Juliann Pulse, RN Phone Number: 10/04/2019, 12:17 PM  Clinical Narrative: awaiting if patient will be receiving chemo , & if so when-left message w/oncology nurse for Dr. Marin Olp, secured chat,no pager tel# listed. [place] rep Sheila-awaiting chemo status.     Expected Discharge Plan: Skilled Nursing Facility Barriers to Discharge: Other (comment)(awaiting if bed available)  Expected Discharge Plan and Services Expected Discharge Plan: [place] arrangements for the past 2 months: [place]                                       Social Determinants of Health (SDOH) Interventions    Readmission Risk Interventions Readmission Risk Prevention Plan 10/03/2019  Transportation Screening Complete  HRI or Home Care Consult Complete  SW Recovery Care/Counseling Consult Complete  Skilled Nursing Facility Complete  Some recent data might be hidden

## 2019-10-04 NOTE — TOC Progression Note (Signed)
Transition of Care [facility]) - Progression Note    Patient Details  Name: Christian Burns MRN: [id number] Date of Birth: [birth year]  Transition of Care [facility]) CM/SW Contact  Jamone Garrido, Juliann Pulse, RN Phone Number: 10/04/2019, 1:12 PM  Clinical Narrative:PTAR cancelled-Nsg will call PTAR when ready-getting IVIG currently.        Expected Discharge Plan: Skilled Nursing Facility Barriers to Discharge: Other (comment)(awaiting if bed available)  Expected Discharge Plan and Services Expected Discharge Plan: [place] arrangements for the past 2 months: [place] Expected Discharge Date: 10/04/19                                     Social Determinants of Health (SDOH) Interventions    Readmission Risk Interventions Readmission Risk Prevention Plan 10/03/2019  Transportation Screening Complete  HRI or Home Care Consult Complete  SW Recovery Care/Counseling Consult Complete  Skilled Nursing Facility Complete  Some recent data might be hidden

## 2019-10-04 NOTE — Plan of Care (Signed)
Patient discharged to SNF in stable condition. Left with PTAR. Report called to [facility] staff member Kristine Garbe

## 2019-10-04 NOTE — Progress Notes (Signed)
Physical Therapy Treatment Patient Details Name: Christian Burns MRN: [id number] DOB: August 15, 1929 Today's Date: 10/04/2019    History of Present Illness 86 year old male with history of multiple myeloma status post chemo therapy, blood transfusions, last transfusion was last week, essential hypertension, chronic osteomyelitis of the jaw, stage III CKD, CVA, and admitted for acute respiratory failure with hypoxia-secondary to multifocal pneumonia    PT Comments    Pt ambulated in hallway and continues to require supplemental oxygen.  Recommend SNF upon d/c for pt to improve endurance, strength, and independence prior to return to ILF.  Follow Up Recommendations  SNF;Supervision/Assistance - 24 hour     Equipment Recommendations  None recommended by PT    Recommendations for Other Services       Precautions / Restrictions Precautions Precautions: Fall Precaution Comments: monitor sats    Mobility  Bed Mobility Overal bed mobility: Needs Assistance Bed Mobility: Supine to Sit     Supine to sit: Min guard;HOB elevated        Transfers Overall transfer level: Needs assistance Equipment used: Rolling walker (2 wheeled) Transfers: Sit to/from Stand Sit to Stand: Min assist         General transfer comment: verbal cues for hand placement, assist to rise and steady, pt uses legs posteriorly against bed to self assist  Ambulation/Gait Ambulation/Gait assistance: Min assist;+2 safety/equipment Gait Distance (Feet): 60 Feet Assistive device: Rolling walker (2 wheeled) Gait Pattern/deviations: Step-through pattern;Decreased stride length     General Gait Details: verbal cues for RW positioning and posture, SPO2 dropped to 84% room air and pt with accidental BM so RN brought recliner to pt and pt placed on 2L O2 [place] with SPO2 improving to 98%; pt brought back to room in recliner and then assisted with standing again for pericare and linen change   Stairs              Wheelchair Mobility    Modified Rankin (Stroke Patients Only)       Balance                                            Cognition Arousal/Alertness: Awake/alert Behavior During Therapy: WFL for tasks assessed/performed Overall Cognitive Status: Within Functional Limits for tasks assessed                                        Exercises      General Comments        Pertinent Vitals/Pain      Home Living                      Prior Function            PT Goals (current goals can now be found in the care plan section) Progress towards PT goals: Progressing toward goals    Frequency    Min 3X/week      PT Plan Current plan remains appropriate    Co-evaluation              AM-PAC PT "6 Clicks" Mobility   Outcome Measure  Help needed turning from your back to your side while in a flat bed without using bedrails?: A Little Help needed moving from lying on your back to sitting  on the side of a flat bed without using bedrails?: A Little Help needed moving to and from a bed to a chair (including a wheelchair)?: A Little Help needed standing up from a chair using your arms (e.g., wheelchair or bedside chair)?: A Little Help needed to walk in hospital room?: A Lot Help needed climbing 3-5 steps with a railing? : A Lot 6 Click Score: 16    End of Session Equipment Utilized During Treatment: Oxygen;Gait belt Activity Tolerance: Patient tolerated treatment well Patient left: in chair;with call bell/phone within reach;with chair alarm set;with nursing/sitter in room Nurse Communication: Mobility status PT Visit Diagnosis: Difficulty in walking, not elsewhere classified (R26.2);Muscle weakness (generalized) (M62.81)     Time: 1005-1030 PT Time Calculation (min) (ACUTE ONLY): 25 min  Charges:  $Gait Training: 8-22 mins $Therapeutic Activity: 8-22 mins                    Arlyce Dice, DPT Acute Rehabilitation  Services Office: [phone]   Lindzey Zent,KATHrine E 10/04/2019, 1:02 PM

## 2019-10-04 NOTE — TOC Transition Note (Signed)
Transition of Care [facility]) - CM/SW Discharge Note   Patient Details  Name: Christian Burns MRN: [id number] Date of Birth: 12-Aug-1930  [facility]) CM/SW Contact:  Dessa Phi, RN Phone Number: 10/04/2019, 1:07 PM   Clinical Narrative:PTAR 2p pick up-per Nsg-rm#[phone],tel# report [place] [phone]. No further CM needs.       Final next level of care: Skilled Nursing Facility Barriers to Discharge: Other (comment)(awaiting if bed available)   Patient Goals and CMS Choice Patient states their goals for this hospitalization and ongoing recovery are:: [place] home with spouse      Discharge Placement              Patient chooses bed at: [facility]                                     Social Determinants of Health (SDOH) Interventions     Readmission Risk Interventions Readmission Risk Prevention Plan 10/03/2019  Transportation Screening Complete  HRI or Home Care Consult Complete  SW Recovery Care/Counseling Consult Complete  Skilled Nursing Facility Complete  Some recent data might be hidden

## 2019-10-04 NOTE — Consult Note (Signed)
   [facility] Inpatient Consult   10/04/2019  Christian Burns 15-Jul-1930 [id number]   Patient screened for potential [facility] Care Management services due to unplanned readmission risk score of 35%, extreme, and multiple hospitalizations.   Per chart review, disposition plan is for SNF. No [facility] CM identifiable needs.  Netta Cedars, MSN, [facility] Liaison Nurse Mobile Phone [phone]  Toll free office [phone]

## 2019-10-11 ENCOUNTER — Ambulatory Visit (INDEPENDENT_AMBULATORY_CARE_PROVIDER_SITE_OTHER): Payer: Medicare HMO | Admitting: *Deleted

## 2019-10-11 DIAGNOSIS — I63412 Cerebral infarction due to embolism of left middle cerebral artery: Secondary | ICD-10-CM | POA: Diagnosis not present

## 2019-10-11 LAB — CUP PACEART REMOTE DEVICE CHECK
Date Time Interrogation Session: 20210228230517
Implantable Pulse Generator Implant Date: 20200909

## 2019-10-12 NOTE — Progress Notes (Signed)
ILR Remote 

## 2019-10-13 ENCOUNTER — Ambulatory Visit: Payer: Medicare HMO | Admitting: Gastroenterology

## 2019-10-18 ENCOUNTER — Telehealth: Payer: Self-pay | Admitting: *Deleted

## 2019-10-18 NOTE — Telephone Encounter (Signed)
Message received from patient's grandson, Christian Burns to review pt.'s upcoming schedule.  Informed pt.'s grandson that next scheduled appt is on 10/27/19 and that per Dr. Marin Olp there is no need for pt to come in any sooner.  Christian Burns is appreciative of call and has no further questions or concerns at this time.

## 2019-10-25 ENCOUNTER — Telehealth: Payer: Self-pay | Admitting: *Deleted

## 2019-10-25 NOTE — Telephone Encounter (Signed)
Received call from [name] patients grandson who states his grandmother called him concerned that Norm is very tired, sleeping and weight is down in the 120s.  Spoke with Dr. Marin Olp.  Appt is is this Wednesday and we will check labs and assess patient at that time.  This may be patients normal after being in the rehab center and will take some time to get strength back up.  [name] feels patient is on Marinol at home for appetite but will check.  [name] satisfied with this and will bring patient on Wednesday.

## 2019-10-27 ENCOUNTER — Inpatient Hospital Stay (HOSPITAL_BASED_OUTPATIENT_CLINIC_OR_DEPARTMENT_OTHER): Payer: Medicare HMO | Admitting: Family

## 2019-10-27 ENCOUNTER — Inpatient Hospital Stay: Payer: Medicare HMO | Attending: Hematology & Oncology

## 2019-10-27 ENCOUNTER — Other Ambulatory Visit: Payer: Medicare HMO

## 2019-10-27 ENCOUNTER — Other Ambulatory Visit: Payer: Self-pay

## 2019-10-27 ENCOUNTER — Other Ambulatory Visit: Payer: Self-pay | Admitting: Family

## 2019-10-27 ENCOUNTER — Inpatient Hospital Stay: Payer: Medicare HMO

## 2019-10-27 ENCOUNTER — Encounter: Payer: Self-pay | Admitting: Family

## 2019-10-27 VITALS — BP 155/78 | HR 80 | Temp 96.9°F | Resp 20 | Wt 129.1 lb

## 2019-10-27 DIAGNOSIS — R0602 Shortness of breath: Secondary | ICD-10-CM | POA: Insufficient documentation

## 2019-10-27 DIAGNOSIS — T451X5A Adverse effect of antineoplastic and immunosuppressive drugs, initial encounter: Secondary | ICD-10-CM | POA: Insufficient documentation

## 2019-10-27 DIAGNOSIS — N289 Disorder of kidney and ureter, unspecified: Secondary | ICD-10-CM | POA: Diagnosis not present

## 2019-10-27 DIAGNOSIS — D649 Anemia, unspecified: Secondary | ICD-10-CM

## 2019-10-27 DIAGNOSIS — D6481 Anemia due to antineoplastic chemotherapy: Secondary | ICD-10-CM | POA: Insufficient documentation

## 2019-10-27 DIAGNOSIS — Z79899 Other long term (current) drug therapy: Secondary | ICD-10-CM | POA: Insufficient documentation

## 2019-10-27 DIAGNOSIS — C9002 Multiple myeloma in relapse: Secondary | ICD-10-CM

## 2019-10-27 DIAGNOSIS — R5383 Other fatigue: Secondary | ICD-10-CM | POA: Diagnosis not present

## 2019-10-27 DIAGNOSIS — C9000 Multiple myeloma not having achieved remission: Secondary | ICD-10-CM

## 2019-10-27 DIAGNOSIS — R531 Weakness: Secondary | ICD-10-CM | POA: Diagnosis not present

## 2019-10-27 DIAGNOSIS — R110 Nausea: Secondary | ICD-10-CM | POA: Insufficient documentation

## 2019-10-27 DIAGNOSIS — R202 Paresthesia of skin: Secondary | ICD-10-CM | POA: Diagnosis not present

## 2019-10-27 DIAGNOSIS — Z882 Allergy status to sulfonamides status: Secondary | ICD-10-CM | POA: Insufficient documentation

## 2019-10-27 DIAGNOSIS — R200 Anesthesia of skin: Secondary | ICD-10-CM | POA: Diagnosis not present

## 2019-10-27 LAB — CMP (CANCER CENTER ONLY)
ALT: 14 U/L (ref 0–44)
AST: 23 U/L (ref 15–41)
Albumin: 2.9 g/dL — ABNORMAL LOW (ref 3.5–5.0)
Alkaline Phosphatase: 48 U/L (ref 38–126)
Anion gap: 6 (ref 5–15)
BUN: 36 mg/dL — ABNORMAL HIGH (ref 8–23)
CO2: 28 mmol/L (ref 22–32)
Calcium: 8.7 mg/dL — ABNORMAL LOW (ref 8.9–10.3)
Chloride: 107 mmol/L (ref 98–111)
Creatinine: 1.36 mg/dL — ABNORMAL HIGH (ref 0.61–1.24)
GFR, Est AFR Am: 54 mL/min — ABNORMAL LOW (ref >60)
GFR, Estimated: 46 mL/min — ABNORMAL LOW (ref >60)
Glucose, Bld: 111 mg/dL — ABNORMAL HIGH (ref 70–99)
Potassium: 3.9 mmol/L (ref 3.5–5.1)
Sodium: 141 mmol/L (ref 135–145)
Total Bilirubin: 0.5 mg/dL (ref 0.3–1.2)
Total Protein: 5.8 g/dL — ABNORMAL LOW (ref 6.5–8.1)

## 2019-10-27 LAB — CBC WITH DIFFERENTIAL (CANCER CENTER ONLY)
Abs Immature Granulocytes: 0.12 10*3/uL — ABNORMAL HIGH (ref 0.00–0.07)
Basophils Absolute: 0 10*3/uL (ref 0.0–0.1)
Basophils Relative: 1 %
Eosinophils Absolute: 0.1 10*3/uL (ref 0.0–0.5)
Eosinophils Relative: 2 %
HCT: 32.8 % — ABNORMAL LOW (ref 39.0–52.0)
Hemoglobin: 10.8 g/dL — ABNORMAL LOW (ref 13.0–17.0)
Immature Granulocytes: 3 %
Lymphocytes Relative: 3 %
Lymphs Abs: 0.1 10*3/uL — ABNORMAL LOW (ref 0.7–4.0)
MCH: 30.5 pg (ref 26.0–34.0)
MCHC: 32.9 g/dL (ref 30.0–36.0)
MCV: 92.7 fL (ref 80.0–100.0)
Monocytes Absolute: 0.7 10*3/uL (ref 0.1–1.0)
Monocytes Relative: 16 %
Neutro Abs: 3.2 10*3/uL (ref 1.7–7.7)
Neutrophils Relative %: 75 %
Platelet Count: 140 10*3/uL — ABNORMAL LOW (ref 150–400)
RBC: 3.54 MIL/uL — ABNORMAL LOW (ref 4.22–5.81)
RDW: 17.2 % — ABNORMAL HIGH (ref 11.5–15.5)
WBC Count: 4.3 10*3/uL (ref 4.0–10.5)
nRBC: 0 % (ref 0.0–0.2)

## 2019-10-27 LAB — SAMPLE TO BLOOD BANK

## 2019-10-27 MED ORDER — SODIUM CHLORIDE 0.9 % IV SOLN
Freq: Once | INTRAVENOUS | Status: DC
  Filled 2019-10-27: qty 250

## 2019-10-27 MED ORDER — SODIUM CHLORIDE 0.9 % IV SOLN
20.0000 mg | Freq: Once | INTRAVENOUS | Status: DC
  Filled 2019-10-27: qty 2

## 2019-10-27 MED ORDER — FAMOTIDINE IN NACL 20-0.9 MG/50ML-% IV SOLN: 20.0000 mg | Freq: Once | INTRAVENOUS | Status: DC

## 2019-10-27 NOTE — Progress Notes (Signed)
Hematology and Oncology Follow Up Visit  Christian Burns [id number] 07-Jun-1930 86 y.o. 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 Palliative radiation therapy to right ilium Kyprolis/Cytoxanq 3 week dosing- s/p cycle 24 (Cytoxan restarted on cycle 13) - DC'd due to progression Radiation therapy to right femur, 10 fraction --completed 3000 rad on 11/06/2018 Elotuzomab - started 10/29/2018 -- s/p cycle #1 -- d/c on 12/24/2018 Pomalidomide 2 mg po q day (21 on/7 off) - started 10/29/2018 -- d/c 12/31/2018 Daratumumab - start cycle # 1 on 12/31/2018 -- d/c on -02/04/2019  Current Therapy:   Bendamustine/Decadron -- started on 02/10/2019, s/p cycle8 Zometa IV q 4 weeks - on hold Aranesp 300 g subcutaneous as needed for hemoglobin less than 10 IVIG 40g IV q month -- start on 07/2019   Interim History:  Christian Burns is here today with his grandson Christian Burns for follow-up. He is finally home from rehab and doing OT several days a week. He is still fatigued and weak at times.  He has SOB with over exertion and will take time out to rest when needed.  Hgb is stable at 10.8, MCV 92, platelets 140, WBC count 4.3.  No fever, chills, vomiting, cough, rash, dizziness, chest pain, palpitations, abdominal pain or changes in urination.  He has had soft stool when needing to urinate and has had some issues with incontinence.  No episodes of bleeding. No petechiae. He does bruise easily but not in excess.  No swelling or tenderness in his extremities.  The numbness and tingling in his feet and hands is stable and unchanged.  No falls or syncopal episodes to report.  He ambulates at home with a walker for support.  He states  that his appetite is slowly improving and he has been drinking two carnation essentials a day. He takes the Marinol twice a day as prescribed. He will sometimes take a Zofran for nausea before eating.  He is doing his best to stay well hydrated and weight is up 2 lbs since leaving rehab.   ECOG Performance Status: 1 - Symptomatic but completely ambulatory  Medications:  Allergies as of 10/27/2019      Reactions   Sulfa Antibiotics Itching   Sulfasalazine Itching      Medication List       Accurate as of October 27, 2019  8:54 AM. If you have any questions, ask your nurse or doctor.        STOP taking these medications   aspirin 81 MG EC tablet Stopped by: Laverna Peace, NP   dextromethorphan-guaiFENesin 30-600 MG 12hr tablet Commonly known as: [place] DM Stopped by: Laverna Peace, NP     TAKE these medications   ALOXI IV Inject into the vein.   amLODipine-benazepril 5-10 MG capsule Commonly known as: LOTREL TAKE ONE CAPSULE EACH DAY What changed: See the new instructions.   bendamustine in sodium chloride 0.9 % 50 mL Inject into the vein once.   dronabinol 5 MG capsule Commonly known as: MARINOL TAKE ONE CAPSULE TWICE DAILY BEFORE MEALS   finasteride 5 MG tablet Commonly known as: PROSCAR TAKE ONE TABLET EACH DAY What changed: See the new instructions.   multivitamin with minerals Tabs tablet Take 1 tablet by mouth daily.  ondansetron 4 MG tablet Commonly known as: ZOFRAN Take 1 tablet (4 mg total) by mouth every 6 (six) hours as needed for nausea.   pantoprazole 40 MG tablet Commonly known as: Protonix Take 1 tablet (40 mg total) by mouth 2 (two) times daily.   penicillin v potassium 500 MG tablet Commonly known as: VEETID Take 1 tablet (500 mg total) by mouth 4 (four) times daily.   polyvinyl alcohol 1.4 % ophthalmic solution Commonly known as: LIQUIFILM TEARS Place 1 drop into both eyes as needed for dry eyes.   predniSONE 20 MG  tablet Commonly known as: Deltasone Take 1 tablet (20 mg total) by mouth daily with breakfast.   PROBIOTIC PO Take 1 capsule by mouth daily.   pyridOXINE 100 MG tablet Commonly known as: VITAMIN B-6 Take 100 mg by mouth daily.   traMADol 50 MG tablet Commonly known as: ULTRAM Take 1 tablet (50 mg total) by mouth every 8 (eight) hours as needed for moderate pain.   vitamin B-12 500 MCG tablet Commonly known as: CYANOCOBALAMIN Take 500 mcg by mouth daily.   Vitamin D (Ergocalciferol) 1.25 MG (50000 UNIT) Caps capsule Commonly known as: DRISDOL Take 50,000 Units by mouth every Sunday.       Allergies:  Allergies  Allergen Reactions  . Sulfa Antibiotics Itching  . Sulfasalazine Itching    Past Medical History, Surgical history, Social history, and Family History were reviewed and updated.  Review of Systems: All other 10 point review of systems is negative.   Physical Exam:  vitals were not taken for this visit.   Wt Readings from Last 3 Encounters:  09/28/19 140 lb (63.5 kg)  09/17/19 148 lb (67.1 kg)  09/14/19 148 lb 6 oz (67.3 kg)    Ocular: Sclerae unicteric, pupils equal, round and reactive to light Ear-nose-throat: Oropharynx clear, dentition fair Lymphatic: No cervical or supraclavicular adenopathy Lungs no rales or rhonchi, good excursion bilaterally Heart regular rate and rhythm, no murmur appreciated Abd soft, nontender, positive bowel sounds, no liver or spleen tip palpated on exam, no fluid wave  MSK no focal spinal tenderness, no joint edema Neuro: non-focal, well-oriented, appropriate affect Breasts: Deferred   Lab Results  Component Value Date   WBC 3.2 (L) 10/04/2019   HGB 11.2 (L) 10/04/2019   HCT 34.4 (L) 10/04/2019   MCV 94.8 10/04/2019   PLT 115 (L) 10/04/2019   Lab Results  Component Value Date   FERRITIN 547 (H) 08/31/2019   IRON 107 08/31/2019   TIBC 220 08/31/2019   UIBC 113 (L) 08/31/2019   IRONPCTSAT 49 08/31/2019   Lab  Results  Component Value Date   RETICCTPCT 1.3 12/10/2018   RBC 3.63 (L) 10/04/2019   Lab Results  Component Value Date   KPAFRELGTCHN 1.4 (L) 08/31/2019   LAMBDASER 85.0 (H) 08/31/2019   KAPLAMBRATIO 0.02 (L) 08/31/2019   Lab Results  Component Value Date   IGGSERUM 628 09/29/2019   IGA 8 (L) 09/29/2019   IGMSERUM 8 (L) 09/29/2019   Lab Results  Component Value Date   TOTALPROTELP 5.4 (L) 08/31/2019   ALBUMINELP 3.0 08/31/2019   A1GS 0.3 08/31/2019   A2GS 0.7 08/31/2019   BETS 0.7 08/31/2019   BETA2SER 0.3 07/28/2015   GAMS 0.8 08/31/2019   MSPIKE 0.5 (H) 08/31/2019   SPEI Comment 08/31/2019     Chemistry      Component Value Date/Time   NA 137 10/04/2019 0429   NA 146 (H) 08/07/2017 1153   NA  141 01/09/2017 1004   K 3.8 10/04/2019 0429   K 4.6 08/07/2017 1153   K 4.4 01/09/2017 1004   CL 104 10/04/2019 0429   CL 108 08/07/2017 1153   CO2 25 10/04/2019 0429   CO2 26 08/07/2017 1153   CO2 22 01/09/2017 1004   BUN 35 (H) 10/04/2019 0429   BUN 24 (H) 08/07/2017 1153   BUN 23.9 01/09/2017 1004   CREATININE 1.62 (H) 10/04/2019 0429   CREATININE 1.68 (H) 10/04/2019 0429   CREATININE 1.86 (H) 09/13/2019 1453   CREATININE 1.56 (H) 05/26/2018 1508   CREATININE 1.5 (H) 01/09/2017 1004      Component Value Date/Time   CALCIUM 9.4 10/04/2019 0429   CALCIUM 8.8 08/07/2017 1153   CALCIUM 8.8 01/09/2017 1004   ALKPHOS 48 10/04/2019 0429   ALKPHOS 97 (H) 08/07/2017 1153   ALKPHOS 80 01/09/2017 1004   AST 25 10/04/2019 0429   AST 25 09/13/2019 1453   AST 18 01/09/2017 1004   ALT 19 10/04/2019 0429   ALT 16 09/13/2019 1453   ALT 20 08/07/2017 1153   ALT 12 01/09/2017 1004   BILITOT 0.7 10/04/2019 0429   BILITOT 0.5 09/13/2019 1453   BILITOT 0.92 01/09/2017 1004       Impression and Plan: Christian Burns is a very pleasant 86yo caucasian gentleman with relapsed IgG lambda myeloma. He has been recuperating in rehab from sepsis with pneumonia last month. He is finally  home and continues to improve.  I spoke with Dr. Marin Olp and we will hold treatment for now and give him IV fluids, decadron and pepcid today.  They are in agreement with the plan.  We will see him back in 3 weeks.  They will contact our office with any questions or concerns. We can certainly see him sooner if needed.   Laverna Peace, NP 3/17/20218:54 AM

## 2019-10-27 NOTE — Progress Notes (Signed)
Pt presents for IVF after seeing provider. This RN and 2 other RN's attempted to place PIV to left and right upper extremities unsuccessfully. After 5 attempts combined between 3 nurses pt and family asked if pt could just supplement fluid orally. Discussed situation with Dr. Marin Olp who agreed ok to discharge patient home for oral fluids. Pt and family agreeable to plan. Discussed getting a implanted port for access and pt agreeable to plan and awaiting schedule for procedure. Pt tolerated attempts well and remained in good spirits. Pt and family member verbalized understanding of plan and had no further questions. Pt given large glass of water for ride home and pt eating a milky [address] candy bar upon discharge.

## 2019-10-28 ENCOUNTER — Inpatient Hospital Stay: Payer: Medicare HMO

## 2019-10-28 ENCOUNTER — Other Ambulatory Visit: Payer: Self-pay | Admitting: Radiology

## 2019-10-28 LAB — IGG, IGA, IGM
IgA: 53 mg/dL — ABNORMAL LOW (ref 61–437)
IgG (Immunoglobin G), Serum: 1262 mg/dL (ref 603–1613)
IgM (Immunoglobulin M), Srm: 416 mg/dL — ABNORMAL HIGH (ref 15–143)

## 2019-10-28 LAB — KAPPA/LAMBDA LIGHT CHAINS
Kappa free light chain: 13.4 mg/L (ref 3.3–19.4)
Kappa, lambda light chain ratio: 0.11 — ABNORMAL LOW (ref 0.26–1.65)
Lambda free light chains: 124.3 mg/L — ABNORMAL HIGH (ref 5.7–26.3)

## 2019-11-01 ENCOUNTER — Other Ambulatory Visit: Payer: Self-pay | Admitting: Family

## 2019-11-01 ENCOUNTER — Ambulatory Visit (HOSPITAL_COMMUNITY)
Admission: RE | Admit: 2019-11-01 | Discharge: 2019-11-01 | Disposition: A | Discharge status: Home / Self Care | Payer: Medicare HMO | Source: Ambulatory Visit | Attending: Hematology & Oncology | Admitting: Hematology & Oncology

## 2019-11-01 ENCOUNTER — Other Ambulatory Visit: Payer: Self-pay

## 2019-11-01 ENCOUNTER — Ambulatory Visit (HOSPITAL_COMMUNITY)
Admission: RE | Admit: 2019-11-01 | Discharge: 2019-11-01 | Disposition: A | Discharge status: Home / Self Care | Payer: Medicare HMO | Source: Ambulatory Visit | Attending: Family | Admitting: Family

## 2019-11-01 ENCOUNTER — Encounter (HOSPITAL_COMMUNITY): Payer: Self-pay

## 2019-11-01 DIAGNOSIS — I129 Hypertensive chronic kidney disease with stage 1 through stage 4 chronic kidney disease, or unspecified chronic kidney disease: Secondary | ICD-10-CM | POA: Diagnosis not present

## 2019-11-01 DIAGNOSIS — N183 Chronic kidney disease, stage 3 unspecified: Secondary | ICD-10-CM | POA: Diagnosis not present

## 2019-11-01 DIAGNOSIS — D649 Anemia, unspecified: Secondary | ICD-10-CM

## 2019-11-01 DIAGNOSIS — Z8249 Family history of ischemic heart disease and other diseases of the circulatory system: Secondary | ICD-10-CM | POA: Diagnosis not present

## 2019-11-01 DIAGNOSIS — C649 Malignant neoplasm of unspecified kidney, except renal pelvis: Secondary | ICD-10-CM | POA: Insufficient documentation

## 2019-11-01 DIAGNOSIS — Z79899 Other long term (current) drug therapy: Secondary | ICD-10-CM | POA: Insufficient documentation

## 2019-11-01 DIAGNOSIS — Z882 Allergy status to sulfonamides status: Secondary | ICD-10-CM | POA: Insufficient documentation

## 2019-11-01 DIAGNOSIS — Z8673 Personal history of transient ischemic attack (TIA), and cerebral infarction without residual deficits: Secondary | ICD-10-CM | POA: Insufficient documentation

## 2019-11-01 DIAGNOSIS — C9000 Multiple myeloma not having achieved remission: Secondary | ICD-10-CM | POA: Insufficient documentation

## 2019-11-01 HISTORY — PX: IR IMAGING GUIDED PORT INSERTION: IMG5740

## 2019-11-01 LAB — CBC WITH DIFFERENTIAL/PLATELET
Abs Immature Granulocytes: 0.34 10*3/uL — ABNORMAL HIGH (ref 0.00–0.07)
Basophils Absolute: 0 10*3/uL (ref 0.0–0.1)
Basophils Relative: 1 %
Eosinophils Absolute: 0.1 10*3/uL (ref 0.0–0.5)
Eosinophils Relative: 2 %
HCT: 35.6 % — ABNORMAL LOW (ref 39.0–52.0)
Hemoglobin: 11.7 g/dL — ABNORMAL LOW (ref 13.0–17.0)
Immature Granulocytes: 6 %
Lymphocytes Relative: 3 %
Lymphs Abs: 0.2 10*3/uL — ABNORMAL LOW (ref 0.7–4.0)
MCH: 30.8 pg (ref 26.0–34.0)
MCHC: 32.9 g/dL (ref 30.0–36.0)
MCV: 93.7 fL (ref 80.0–100.0)
Monocytes Absolute: 0.7 10*3/uL (ref 0.1–1.0)
Monocytes Relative: 14 %
Neutro Abs: 3.9 10*3/uL (ref 1.7–7.7)
Neutrophils Relative %: 74 %
Platelets: 147 10*3/uL — ABNORMAL LOW (ref 150–400)
RBC: 3.8 MIL/uL — ABNORMAL LOW (ref 4.22–5.81)
RDW: 17 % — ABNORMAL HIGH (ref 11.5–15.5)
WBC: 5.3 10*3/uL (ref 4.0–10.5)
nRBC: 0 % (ref 0.0–0.2)

## 2019-11-01 LAB — PROTEIN ELECTROPHORESIS, SERUM, WITH REFLEX
A/G Ratio: 0.8 (ref 0.7–1.7)
Albumin ELP: 2.5 g/dL — ABNORMAL LOW (ref 2.9–4.4)
Alpha-1-Globulin: 0.3 g/dL (ref 0.0–0.4)
Alpha-2-Globulin: 0.7 g/dL (ref 0.4–1.0)
Beta Globulin: 0.7 g/dL (ref 0.7–1.3)
Gamma Globulin: 1.3 g/dL (ref 0.4–1.8)
Globulin, Total: 3 g/dL (ref 2.2–3.9)
M-Spike, %: 0.4 g/dL — ABNORMAL HIGH
SPEP Interpretation: 0
Total Protein ELP: 5.5 g/dL — ABNORMAL LOW (ref 6.0–8.5)

## 2019-11-01 LAB — PROTIME-INR
INR: 1 (ref 0.8–1.2)
Prothrombin Time: 13.3 seconds (ref 11.4–15.2)

## 2019-11-01 LAB — IMMUNOFIXATION REFLEX, SERUM
IgA: 55 mg/dL — ABNORMAL LOW (ref 61–437)
IgG (Immunoglobin G), Serum: 1397 mg/dL (ref 603–1613)
IgM (Immunoglobulin M), Srm: 454 mg/dL — ABNORMAL HIGH (ref 15–143)

## 2019-11-01 LAB — BASIC METABOLIC PANEL
Anion gap: 8 (ref 5–15)
BUN: 31 mg/dL — ABNORMAL HIGH (ref 8–23)
CO2: 25 mmol/L (ref 22–32)
Calcium: 8.6 mg/dL — ABNORMAL LOW (ref 8.9–10.3)
Chloride: 108 mmol/L (ref 98–111)
Creatinine, Ser: 1.26 mg/dL — ABNORMAL HIGH (ref 0.61–1.24)
GFR calc Af Amer: 59 mL/min — ABNORMAL LOW (ref >60)
GFR calc non Af Amer: 51 mL/min — ABNORMAL LOW (ref >60)
Glucose, Bld: 84 mg/dL (ref 70–99)
Potassium: 3.4 mmol/L — ABNORMAL LOW (ref 3.5–5.1)
Sodium: 141 mmol/L (ref 135–145)

## 2019-11-01 MED ORDER — SODIUM CHLORIDE 0.9 % IV SOLN: INTRAVENOUS | Status: DC

## 2019-11-01 MED ORDER — LIDOCAINE-EPINEPHRINE 1 %-1:100000 IJ SOLN
INTRAMUSCULAR | Status: AC | PRN
  Administered 2019-11-01: 10 mL

## 2019-11-01 MED ORDER — FENTANYL CITRATE (PF) 100 MCG/2ML IJ SOLN
INTRAMUSCULAR | Status: AC
  Filled 2019-11-01: qty 2

## 2019-11-01 MED ORDER — LIDOCAINE HCL (PF) 1 % IJ SOLN
INTRAMUSCULAR | Status: AC | PRN
  Administered 2019-11-01: 5 mL

## 2019-11-01 MED ORDER — LIDOCAINE HCL 1 % IJ SOLN
INTRAMUSCULAR | Status: AC
  Filled 2019-11-01: qty 20

## 2019-11-01 MED ORDER — CEFAZOLIN SODIUM-DEXTROSE 2-4 GM/100ML-% IV SOLN
INTRAVENOUS | Status: AC
  Administered 2019-11-01: 2 g via INTRAVENOUS
  Filled 2019-11-01: qty 100

## 2019-11-01 MED ORDER — HEPARIN SOD (PORK) LOCK FLUSH 100 UNIT/ML IV SOLN
INTRAVENOUS | Status: AC
  Filled 2019-11-01: qty 5

## 2019-11-01 MED ORDER — LIDOCAINE-EPINEPHRINE 1 %-1:100000 IJ SOLN
INTRAMUSCULAR | Status: AC
  Filled 2019-11-01: qty 1

## 2019-11-01 MED ORDER — MIDAZOLAM HCL 2 MG/2ML IJ SOLN
INTRAMUSCULAR | Status: AC | PRN
  Administered 2019-11-01 (×4): 0.5 mg via INTRAVENOUS

## 2019-11-01 MED ORDER — CEFAZOLIN SODIUM-DEXTROSE 2-4 GM/100ML-% IV SOLN: 2.0000 g | INTRAVENOUS | Status: AC

## 2019-11-01 MED ORDER — HEPARIN SOD (PORK) LOCK FLUSH 100 UNIT/ML IV SOLN
INTRAVENOUS | Status: AC | PRN
  Administered 2019-11-01: 500 [IU] via INTRAVENOUS

## 2019-11-01 MED ORDER — FENTANYL CITRATE (PF) 100 MCG/2ML IJ SOLN
INTRAMUSCULAR | Status: AC | PRN
  Administered 2019-11-01 (×4): 25 ug via INTRAVENOUS

## 2019-11-01 MED ORDER — MIDAZOLAM HCL 2 MG/2ML IJ SOLN
INTRAMUSCULAR | Status: AC
  Filled 2019-11-01: qty 2

## 2019-11-01 NOTE — Procedures (Signed)
Interventional Radiology Procedure:   Indications: Multiple myeloma  Procedure: Port placement  Findings: Right jugular port, tip at SVC/RA junction  Complications: None     EBL: Minimal, less than 10 ml  Plan: Discharge in one hour.  Keep port site and incisions dry for at least 24 hours.     Christian Harm R. Anselm Pancoast, MD  Pager: [phone]

## 2019-11-01 NOTE — Discharge Instructions (Signed)
DO NOT APPLY ANY EMLA CREAM OR LOTIONS TO THE PORT SITE FOR THE NEXT 2 WEEKS; For any questions or concerns call [phone]; for after hours call [phone] and ask for on call MD  Moderate Conscious Sedation, Adult, Care After These instructions provide you with information about caring for yourself after your procedure. Your health care provider may also give you more specific instructions. Your treatment has been planned according to current medical practices, but problems sometimes occur. Call your health care provider if you have any problems or questions after your procedure. What can I expect after the procedure? After your procedure, it is common:  To feel sleepy for several hours.  To feel clumsy and have poor balance for several hours.  To have poor judgment for several hours.  To vomit if you eat too soon. Follow these instructions at home: For at least 24 hours after the procedure:   Do not: ? Participate in activities where you could fall or become injured. ? Drive. ? Use heavy machinery. ? Drink alcohol. ? Take sleeping pills or medicines that cause drowsiness. ? Make important decisions or sign legal documents. ? Take care of children on your own.  Rest. Eating and drinking  Follow the diet recommended by your health care provider.  If you vomit: ? Drink water, juice, or soup when you can drink without vomiting. ? Make sure you have little or no nausea before eating solid foods. General instructions  Have a responsible adult stay with you until you are awake and alert.  Take over-the-counter and prescription medicines only as told by your health care provider.  If you smoke, do not smoke without supervision.  Keep all follow-up visits as told by your health care provider. This is important. Contact a health care provider if:  You keep feeling nauseous or you keep vomiting.  You feel light-headed.  You develop a rash.  You have a fever. Get help  right away if:  You have trouble breathing. This information is not intended to replace advice given to you by your health care provider. Make sure you discuss any questions you have with your health care provider. Document Revised: 07/11/2017 Document Reviewed: 11/18/2015 Elsevier Patient Education  [place] Insertion, Care After This sheet gives you information about how to care for yourself after your procedure. Your health care provider may also give you more specific instructions. If you have problems or questions, contact your health care provider. What can I expect after the procedure? After the procedure, it is common to have:  Discomfort at the port insertion site.  Bruising on the skin over the port. This should improve over 3-4 days. Follow these instructions at home: [facility] care  After your port is placed, you will get a manufacturer's information card. The card has information about your port. Keep this card with you at all times.  Take care of the port as told by your health care provider. Ask your health care provider if you or a family member can get training for taking care of the port at home. A home health care nurse may also take care of the port.  Make sure to remember what type of port you have. Incision care      Follow instructions from your health care provider about how to take care of your port insertion site. Make sure you: ? Wash your hands with soap and water before and after you change your bandage (dressing). If  soap and water are not available, use hand sanitizer. ? Change your dressing as told by your health care provider. ? Leave stitches (sutures), skin glue, or adhesive strips in place. These skin closures may need to stay in place for 2 weeks or longer. If adhesive strip edges start to loosen and curl up, you may trim the loose edges. Do not remove adhesive strips completely unless your health care provider tells you to  do that.  Check your port insertion site every day for signs of infection. Check for: ? Redness, swelling, or pain. ? Fluid or blood. ? Warmth. ? Pus or a bad smell. Activity  Return to your normal activities as told by your health care provider. Ask your health care provider what activities are safe for you.  Do not lift anything that is heavier than 10 lb (4.5 kg), or the limit that you are told, until your health care provider says that it is safe. General instructions  Take over-the-counter and prescription medicines only as told by your health care provider.  Do not take baths, swim, or use a hot tub until your health care provider approves. Ask your health care provider if you may take showers. You may only be allowed to take sponge baths.  Do not drive for 24 hours if you were given a sedative during your procedure.  Wear a medical alert bracelet in case of an emergency. This will tell any health care providers that you have a port.  Keep all follow-up visits as told by your health care provider. This is important. Contact a health care provider if:  You cannot flush your port with saline as directed, or you cannot draw blood from the port.  You have a fever or chills.  You have redness, swelling, or pain around your port insertion site.  You have fluid or blood coming from your port insertion site.  Your port insertion site feels warm to the touch.  You have pus or a bad smell coming from the port insertion site. Get help right away if:  You have chest pain or shortness of breath.  You have bleeding from your port that you cannot control. Summary  Take care of the port as told by your health care provider. Keep the manufacturer's information card with you at all times.  Change your dressing as told by your health care provider.  Contact a health care provider if you have a fever or chills or if you have redness, swelling, or pain around your port insertion  site.  Keep all follow-up visits as told by your health care provider. This information is not intended to replace advice given to you by your health care provider. Make sure you discuss any questions you have with your health care provider. Document Revised: 02/24/2018 Document Reviewed: 02/24/2018 Elsevier Patient Education  [place].

## 2019-11-01 NOTE — Consult Note (Signed)
Chief Complaint: Patient was seen in consultation today for Port-A-Cath placement  Referring Physician(s): [place],Sarah M/Ennever, P  Supervising Physician: Markus Daft  Patient Status: [place]  History of Present Illness: Christian Burns is an 86 y.o. male with history of relapsing multiple myeloma and poor venous access who presents today for Port-A-Cath placement for additional treatment.  Of note patient is currently on antibiotic therapy for chronic osteomyelitis of the jaw and also had positive blood culture last month with Clostridium.  He is currently afebrile and with normal white count.  He was COVID-19 negative in February of this year.  Additional medical history as below.  Past Medical History:  Diagnosis Date  . Anemia   . Blood transfusion without reported diagnosis   . CKD (chronic kidney disease) 07/06/2018   CKD stage III  . Goals of care, counseling/discussion 02/04/2019  . History of radiation therapy 06/17/13-07/06/13   35 gray to upper lumbar spine  . 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 Hypertension   . Inguinal hernia   . Multiple myeloma [facility]) Sept 2010  . Multiple myeloma in relapse ([place]) 04/05/2016  . Radiation 09/26/15-10/20/15   lower thoracic spine, upper lumbar spine 30 gray  . Stroke [facility])     Past Surgical History:  Procedure Laterality Date  . COLONOSCOPY    . HIP PINNING,CANNULATED Right 09/01/2018   Procedure: [facility] NAIL HIP PINNING;  Surgeon: Melrose Nakayama, MD;  Location: [place];  Service: Orthopedics;  Laterality: Right;  . LOOP RECORDER INSERTION N/A 04/21/2019   Procedure: LOOP RECORDER INSERTION;  Surgeon: Evans Lance, MD;  Location: [place] [facility];  Service: Cardiovascular;  Laterality: N/A;  . TEE WITHOUT CARDIOVERSION N/A 10/09/2018   Procedure: TRANSESOPHAGEAL ECHOCARDIOGRAM (TEE);  Surgeon: Acie Fredrickson Wonda Cheng, MD;  Location: [facility];  Service: Cardiovascular;  Laterality: N/A;  . TONSILLECTOMY       Allergies: Sulfa antibiotics and Sulfasalazine  Medications: Prior to Admission medications   Medication Sig Start Date End Date Taking? Authorizing Provider  amLODipine-benazepril (LOTREL) 5-10 MG capsule TAKE ONE CAPSULE EACH DAY Patient taking differently: Take 1 capsule by mouth daily.  09/22/19  Yes [name], MD  dronabinol (MARINOL) 5 MG capsule TAKE ONE CAPSULE TWICE DAILY BEFORE MEALS 08/23/19  Yes Ennever, Rudell Cobb, MD  finasteride (PROSCAR) 5 MG tablet TAKE ONE TABLET EACH DAY Patient taking differently: Take 5 mg by mouth daily.  04/22/19  Yes [name], MD  Multiple Vitamins-Minerals (QC MULTI-VITE 50 & OVER PO) TAKE ONE TABLET DAILY 09/11/19  Yes [provider]  ondansetron (ZOFRAN) 4 MG tablet Take 1 tablet (4 mg total) by mouth every 6 (six) hours as needed for nausea. 10/04/19  Yes Oswald Hillock, MD  penicillin v potassium (VEETID) 500 MG tablet Take 1 tablet (500 mg total) by mouth 4 (four) times daily. 10/12/19  Yes Oswald Hillock, MD  polyvinyl alcohol (LIQUIFILM TEARS) 1.4 % ophthalmic solution Place 1 drop into both eyes as needed for dry eyes. 03/05/19  Yes Swayze, Ava, DO  predniSONE (DELTASONE) 20 MG tablet Take 1 tablet (20 mg total) by mouth daily with breakfast. 06/22/19  Yes Ennever, Rudell Cobb, MD  Probiotic Product (PROBIOTIC PO) Take 1 capsule by mouth daily.   Yes [provider]  pyridOXINE (VITAMIN B-6) 100 MG tablet Take 100 mg by mouth daily.  09/17/13  Yes [name], MD  vitamin B-12 (CYANOCOBALAMIN) 500 MCG tablet Take 500 mcg by mouth  daily.   Yes [provider]  Vitamin D, Ergocalciferol, (DRISDOL) 50000 UNITS CAPS capsule Take 50,000 Units by mouth every Sunday.    Yes [provider]  bendamustine in sodium chloride 0.9 % 50 mL Inject into the vein once.    [provider]  Palonosetron HCl (ALOXI IV) Inject into the vein.    [provider]  pantoprazole (PROTONIX) 40 MG tablet Take 1  tablet (40 mg total) by mouth 2 (two) times daily. Patient not taking: Reported on 10/27/2019 09/13/19   [name], MD  traMADol (ULTRAM) 50 MG tablet Take 1 tablet (50 mg total) by mouth every 8 (eight) hours as needed for moderate pain. Patient not taking: Reported on 10/27/2019 10/04/19   Oswald Hillock, MD     Family History  Problem Relation Age of Onset  . Hypertension Mother   . Hypertension Father   . Colon cancer Neg Hx   . Esophageal cancer Neg Hx   . Cancer Neg Hx   . Rectal cancer Neg Hx   . Stomach cancer Neg Hx     Social History   Socioeconomic History  . Marital status: Married    Spouse name: Romie Minus  . Number of children: Not on file  . Years of education: Not on file  . Highest education level: Not on file  Occupational History  . Occupation: retired  Tobacco Use  . Smoking status: Never Smoker  . Smokeless tobacco: Never Used  . Tobacco comment: never used tobacco  Substance and Sexual Activity  . Alcohol use: Yes    Alcohol/week: 4.0 standard drinks    Types: 4 Glasses of wine per week  . Drug use: No  . Sexual activity: Not on file  Other Topics Concern  . Not on file  Social History Narrative   Mr Thielke is retired and lives at [employer] at [address] retirement apartment with his wife, Romie Minus. They have assist from their son, [name] and grandson, [name]. He is independent/assist with his care needs His son and/or grandson assists with transportation to medical appointments   Social Determinants of Health   Financial Resource Strain: Low Risk   . Difficulty of Paying Living Expenses: Not hard at all  Food Insecurity: No Food Insecurity  . Worried About Charity fundraiser in the Last Year: Never true  . Ran Out of Food in the Last Year: Never true  Transportation Needs: No Transportation Needs  . Lack of Transportation (Medical): No  . Lack of Transportation (Non-Medical): No  Physical Activity:   . Days of Exercise per Week:   . Minutes of  Exercise per Session:   Stress:   . Feeling of Stress :   Social Connections: Unknown  . Frequency of Communication with Friends and Family: More than three times a week  . Frequency of Social Gatherings with Friends and Family: More than three times a week  . Attends Religious Services: Not on file  . Active Member of Clubs or Organizations: Not on file  . Attends Archivist Meetings: Not on file  . Marital Status: Married      Review of Systems denies fever, headache, chest pain, dyspnea, cough, abdominal/back pain, nausea, vomiting or bleeding.  Vital Signs: Blood pressure 177/106, heart rate 77, temperature 97.5, respirations 16, O2 sat 100% room air   Physical Exam awake, alert.  Chest clear to auscultation bilaterally.  Heart with regular rate and rhythm.  Abdomen soft, positive  bowel sounds, nontender.  Left lower extremity edema noted, no right lower extremity edema.  Imaging: CUP PACEART REMOTE DEVICE CHECK  Result Date: 10/11/2019 Carelink summary report received. Battery status OK. Normal device function. No new symptom episodes, tachy episodes, brady, or pause episodes. 18 AF longest 4 mins previously reviewed/reported as false due to ectopy. Monthly summary reports and ROV/PRN   Labs:  CBC: Recent Labs    09/30/19 0217 10/01/19 0222 10/04/19 0429 10/27/19 0833  WBC 4.1 3.2* 3.2* 4.3  HGB 10.1* 9.8* 11.2* 10.8*  HCT 30.1* 30.3* 34.4* 32.8*  PLT 110* 108* 115* 140*    COAGS: Recent Labs    03/02/19 2342 04/19/19 1528 09/28/19 0335  INR 1.3* 1.1 1.3*  APTT 29 21* 29    BMP: Recent Labs    09/30/19 0217 09/30/19 0217 10/01/19 0222 10/01/19 0222 10/02/19 0501 10/03/19 0506 10/04/19 0429 10/27/19 0833  NA 137  --  138  --   --   --  137 141  K 3.4*  --  3.9  --   --   --  3.8 3.9  CL 104  --  106  --   --   --  104 107  CO2 23  --  24  --   --   --  25 28  GLUCOSE 114*  --  98  --   --   --  91 111*  BUN 40*  --  37*  --   --   --   35* 36*  CALCIUM 8.6*  --  8.7*  --   --   --  9.4 8.7*  CREATININE 2.03*   < > 1.72*   < > 1.51* 1.70* 1.68*  1.62* 1.36*  GFRNONAA 29*   < > 35*   < > 41* 35* 36*  38* 46*  GFRAA 33*   < > 41*   < > 47* 41* 42*  44* 54*   < > = values in this interval not displayed.    LIVER FUNCTION TESTS: Recent Labs    09/30/19 0217 10/01/19 0222 10/04/19 0429 10/27/19 0833  BILITOT 0.4 0.7 0.7 0.5  AST 37 31 25 23   ALT 18 17 19 14   ALKPHOS 47 49 48 48  PROT 4.9* 4.7* 5.0* 5.8*  ALBUMIN 2.0* 1.9* 1.9* 2.9*    TUMOR MARKERS: No results for input(s): AFPTM, CEA, CA199, CHROMGRNA in the last 8760 hours.  Assessment and Plan: 86 y.o. male with history of relapsing multiple myeloma and poor venous access who presents today for Port-A-Cath placement for additional treatment.  Of note patient is currently on antibiotic therapy for chronic osteomyelitis of the jaw and also had positive blood culture last month with Clostridium.  He is currently afebrile and with normal white count.  He was COVID-19 negative in February of this year.Risks and benefits of image guided port-a-catheter placement was discussed with the patient including, but not limited to bleeding, infection, pneumothorax, or fibrin sheath development and need for additional procedures.  All of the patient's questions were answered, patient is agreeable to proceed. Consent signed and in chart.     Thank you for this interesting consult.  I greatly enjoyed meeting Christian Burns and look forward to participating in their care.  A copy of this report was sent to the requesting provider on this date.  Electronically Signed: D. Rowe Robert, PA-C 11/01/2019, 1:53 PM   I spent a total of 25 minutes  in face  to face in clinical consultation, greater than 50% of which was counseling/coordinating care for Port-A-Cath placement

## 2019-11-05 ENCOUNTER — Other Ambulatory Visit: Payer: Self-pay | Admitting: *Deleted

## 2019-11-05 ENCOUNTER — Telehealth: Payer: Self-pay | Admitting: *Deleted

## 2019-11-05 DIAGNOSIS — C9002 Multiple myeloma in relapse: Secondary | ICD-10-CM

## 2019-11-05 DIAGNOSIS — R112 Nausea with vomiting, unspecified: Secondary | ICD-10-CM

## 2019-11-05 DIAGNOSIS — R634 Abnormal weight loss: Secondary | ICD-10-CM

## 2019-11-05 MED ORDER — DRONABINOL 5 MG PO CAPS: ORAL_CAPSULE | ORAL | 0 refills | Status: DC

## 2019-11-05 MED ORDER — LIDOCAINE-PRILOCAINE 2.5-2.5 % EX CREA: 1.0000 "application " | TOPICAL_CREAM | CUTANEOUS | 0 refills | Status: DC | PRN

## 2019-11-05 MED ORDER — PREDNISONE 20 MG PO TABS: 20.0000 mg | ORAL_TABLET | Freq: Every day | ORAL | 2 refills | Status: DC

## 2019-11-05 NOTE — Telephone Encounter (Signed)
Call received from patient wanting to know if he should continue taking Prednisone.  Dr. Marin Olp notified.  Call placed back to patient and patient notified per order of Dr. Marin Olp to continue Prednisone as ordered.  Refill sent to [facility] per pt.'s request.

## 2019-11-10 NOTE — Progress Notes (Signed)
Pharmacist Chemotherapy Monitoring - Follow Up Assessment    I verify that I have reviewed each item in the below checklist:  . Regimen for the patient is scheduled for the appropriate day and plan matches scheduled date. Marland Kitchen Appropriate non-routine labs are ordered dependent on drug ordered. . If applicable, additional medications reviewed and ordered per protocol based on lifetime cumulative doses and/or treatment regimen.   Plan for follow-up and/or issues identified: Yes . I-vent associated with next due treatment: Yes . MD and/or nursing notified: Yes  Christian Burns, Jacqlyn Larsen 11/10/2019 1:57 PM

## 2019-11-15 ENCOUNTER — Ambulatory Visit (INDEPENDENT_AMBULATORY_CARE_PROVIDER_SITE_OTHER): Payer: Medicare HMO | Admitting: *Deleted

## 2019-11-15 DIAGNOSIS — I63412 Cerebral infarction due to embolism of left middle cerebral artery: Secondary | ICD-10-CM

## 2019-11-17 ENCOUNTER — Inpatient Hospital Stay: Payer: Medicare HMO | Attending: Hematology & Oncology

## 2019-11-17 ENCOUNTER — Other Ambulatory Visit: Payer: Self-pay

## 2019-11-17 ENCOUNTER — Inpatient Hospital Stay: Payer: Medicare HMO

## 2019-11-17 ENCOUNTER — Inpatient Hospital Stay (HOSPITAL_BASED_OUTPATIENT_CLINIC_OR_DEPARTMENT_OTHER): Payer: Medicare HMO | Admitting: Family

## 2019-11-17 ENCOUNTER — Encounter: Payer: Self-pay | Admitting: Family

## 2019-11-17 VITALS — BP 171/77 | HR 72 | Temp 97.7°F | Resp 18 | Ht 70.0 in | Wt 128.8 lb

## 2019-11-17 DIAGNOSIS — R0602 Shortness of breath: Secondary | ICD-10-CM | POA: Insufficient documentation

## 2019-11-17 DIAGNOSIS — D649 Anemia, unspecified: Secondary | ICD-10-CM

## 2019-11-17 DIAGNOSIS — R159 Full incontinence of feces: Secondary | ICD-10-CM | POA: Insufficient documentation

## 2019-11-17 DIAGNOSIS — K449 Diaphragmatic hernia without obstruction or gangrene: Secondary | ICD-10-CM | POA: Diagnosis not present

## 2019-11-17 DIAGNOSIS — Z79899 Other long term (current) drug therapy: Secondary | ICD-10-CM | POA: Diagnosis not present

## 2019-11-17 DIAGNOSIS — D508 Other iron deficiency anemias: Secondary | ICD-10-CM

## 2019-11-17 DIAGNOSIS — C9002 Multiple myeloma in relapse: Secondary | ICD-10-CM

## 2019-11-17 DIAGNOSIS — N289 Disorder of kidney and ureter, unspecified: Secondary | ICD-10-CM | POA: Diagnosis not present

## 2019-11-17 DIAGNOSIS — Z882 Allergy status to sulfonamides status: Secondary | ICD-10-CM | POA: Diagnosis not present

## 2019-11-17 DIAGNOSIS — H1132 Conjunctival hemorrhage, left eye: Secondary | ICD-10-CM | POA: Insufficient documentation

## 2019-11-17 DIAGNOSIS — Z923 Personal history of irradiation: Secondary | ICD-10-CM | POA: Insufficient documentation

## 2019-11-17 DIAGNOSIS — D6481 Anemia due to antineoplastic chemotherapy: Secondary | ICD-10-CM | POA: Diagnosis not present

## 2019-11-17 DIAGNOSIS — G629 Polyneuropathy, unspecified: Secondary | ICD-10-CM | POA: Insufficient documentation

## 2019-11-17 DIAGNOSIS — R5383 Other fatigue: Secondary | ICD-10-CM | POA: Insufficient documentation

## 2019-11-17 DIAGNOSIS — C9000 Multiple myeloma not having achieved remission: Secondary | ICD-10-CM

## 2019-11-17 LAB — CMP (CANCER CENTER ONLY)
ALT: 13 U/L (ref 0–44)
AST: 25 U/L (ref 15–41)
Albumin: 3.2 g/dL — ABNORMAL LOW (ref 3.5–5.0)
Alkaline Phosphatase: 51 U/L (ref 38–126)
Anion gap: 5 (ref 5–15)
BUN: 37 mg/dL — ABNORMAL HIGH (ref 8–23)
CO2: 25 mmol/L (ref 22–32)
Calcium: 8.6 mg/dL — ABNORMAL LOW (ref 8.9–10.3)
Chloride: 109 mmol/L (ref 98–111)
Creatinine: 1.36 mg/dL — ABNORMAL HIGH (ref 0.61–1.24)
GFR, Est AFR Am: 54 mL/min — ABNORMAL LOW (ref >60)
GFR, Estimated: 46 mL/min — ABNORMAL LOW (ref >60)
Glucose, Bld: 122 mg/dL — ABNORMAL HIGH (ref 70–99)
Potassium: 4.2 mmol/L (ref 3.5–5.1)
Sodium: 139 mmol/L (ref 135–145)
Total Bilirubin: 0.5 mg/dL (ref 0.3–1.2)
Total Protein: 5.6 g/dL — ABNORMAL LOW (ref 6.5–8.1)

## 2019-11-17 LAB — CBC WITH DIFFERENTIAL (CANCER CENTER ONLY)
Abs Immature Granulocytes: 0 10*3/uL (ref 0.00–0.07)
Band Neutrophils: 5 %
Basophils Absolute: 0 10*3/uL (ref 0.0–0.1)
Basophils Relative: 0 %
Eosinophils Absolute: 0.1 10*3/uL (ref 0.0–0.5)
Eosinophils Relative: 1 %
HCT: 29.2 % — ABNORMAL LOW (ref 39.0–52.0)
Hemoglobin: 9.7 g/dL — ABNORMAL LOW (ref 13.0–17.0)
Lymphocytes Relative: 4 %
Lymphs Abs: 0.2 10*3/uL — ABNORMAL LOW (ref 0.7–4.0)
MCH: 30.9 pg (ref 26.0–34.0)
MCHC: 33.2 g/dL (ref 30.0–36.0)
MCV: 93 fL (ref 80.0–100.0)
Monocytes Absolute: 0.2 10*3/uL (ref 0.1–1.0)
Monocytes Relative: 4 %
Neutro Abs: 5.6 10*3/uL (ref 1.7–7.7)
Neutrophils Relative %: 86 %
Platelet Count: 146 10*3/uL — ABNORMAL LOW (ref 150–400)
RBC: 3.14 MIL/uL — ABNORMAL LOW (ref 4.22–5.81)
RDW: 17.3 % — ABNORMAL HIGH (ref 11.5–15.5)
WBC Count: 6.1 10*3/uL (ref 4.0–10.5)
nRBC: 0 % (ref 0.0–0.2)

## 2019-11-17 LAB — CUP PACEART REMOTE DEVICE CHECK
Date Time Interrogation Session: 20210331230435
Implantable Pulse Generator Implant Date: 20200909

## 2019-11-17 MED ORDER — PALONOSETRON HCL INJECTION 0.25 MG/5ML
0.2500 mg | Freq: Once | INTRAVENOUS | Status: AC
  Administered 2019-11-17: 0.25 mg via INTRAVENOUS

## 2019-11-17 MED ORDER — PALONOSETRON HCL INJECTION 0.25 MG/5ML
INTRAVENOUS | Status: AC
  Filled 2019-11-17: qty 5

## 2019-11-17 MED ORDER — SODIUM CHLORIDE 0.9 % IV SOLN
75.0000 mg/m2 | Freq: Once | INTRAVENOUS | Status: AC
  Administered 2019-11-17: 14:00:00 125 mg via INTRAVENOUS
  Filled 2019-11-17: qty 5

## 2019-11-17 MED ORDER — HEPARIN SOD (PORK) LOCK FLUSH 100 UNIT/ML IV SOLN
500.0000 [IU] | Freq: Once | INTRAVENOUS | Status: AC | PRN
  Administered 2019-11-17: 15:00:00 500 [IU]
  Filled 2019-11-17: qty 5

## 2019-11-17 MED ORDER — SODIUM CHLORIDE 0.9% FLUSH
10.0000 mL | INTRAVENOUS | Status: DC | PRN
  Administered 2019-11-17: 15:00:00 10 mL
  Filled 2019-11-17: qty 10

## 2019-11-17 MED ORDER — SODIUM CHLORIDE 0.9 % IV SOLN
Freq: Once | INTRAVENOUS | Status: AC
  Filled 2019-11-17: qty 250

## 2019-11-17 MED ORDER — SODIUM CHLORIDE 0.9 % IV SOLN
10.0000 mg | Freq: Once | INTRAVENOUS | Status: AC
  Administered 2019-11-17: 13:00:00 10 mg via INTRAVENOUS
  Filled 2019-11-17: qty 10

## 2019-11-17 MED ORDER — SODIUM CHLORIDE 0.9% FLUSH
10.0000 mL | Freq: Once | INTRAVENOUS | Status: AC
  Administered 2019-11-17: 11:00:00 10 mL via INTRAVENOUS
  Filled 2019-11-17: qty 10

## 2019-11-17 NOTE — Patient Instructions (Signed)
Tunneled Central Venous Catheter Flushing Guide  It is important to flush your tunneled central venous catheter each time you use it, both before and after you use it. Flushing your catheter will help prevent it from clogging. What are the risks? Risks may include:  Infection.  Air getting into the catheter and bloodstream. Supplies needed:  A clean pair of gloves.  A disinfecting wipe. Use an alcohol wipe, chlorhexidine wipe, or iodine wipe as told by your health care provider.  A 10 mL syringe that has been prefilled with saline solution.  An empty 10 mL syringe, if a substance called heparin was injected into your catheter. How to flush your catheter When you flush your catheter, make sure you follow any specific instructions from your health care provider or the manufacturer. These are general guidelines. Flushing your catheter before use If there is heparin in your catheter: 1. Wash your hands with soap and water. 2. Put on gloves. 3. Scrub the injection cap for a minimum of 15 seconds with a disinfecting wipe. 4. Unclamp the catheter. 5. Attach the empty syringe to the injection cap. 6. Pull the syringe plunger back and withdraw 10 mL of blood. 7. Place the syringe into an appropriate waste container. 8. Scrub the injection cap for 15 seconds with a disinfecting wipe. 9. Attach the prefilled syringe to the injection cap. 10. Flush the catheter by pushing the plunger forward until all the liquid from the syringe is in the catheter. 11. Remove the syringe from the injection cap. 12. Clamp the catheter. If there is no heparin in your catheter: 1. Wash your hands with soap and water. 2. Put on gloves. 3. Scrub the injection cap for 15 seconds with a disinfecting wipe. 4. Unclamp the catheter. 5. Attach the prefilled syringe to the injection cap. 6. Flush the catheter by pushing the plunger forward until 5 mL of the liquid from the syringe is in the catheter. 7. Pull back on  the syringe until you see blood in the catheter. 8. If you have been asked to collect any blood, follow your health care provider's instructions. Otherwise, flush the catheter with the rest of the solution from the syringe. 9. Remove the syringe from the injection cap. 10. Clamp the catheter.  Flushing your catheter after use 1. Wash your hands with soap and water. 2. Put on gloves. 3. Scrub the injection cap for 15 seconds with a disinfecting wipe. 4. Unclamp the catheter. 5. Attach the prefilled syringe to the injection cap. 6. Flush the catheter by pushing the plunger forward until all of the liquid from the syringe is in the catheter. 7. Remove the syringe from the injection cap. 8. Clamp the catheter. Problems and solutions  If blood cannot be completely cleared from the injection cap, you may need to have the injection cap replaced.  If the catheter is difficult to flush, use the pulsing method. The pulsing method involves pushing only a few milliliters of solution into the catheter at a time and pausing between pushes.  If you do not see blood in the catheter when you pull back on the syringe, change your body position, such as by raising your arms above your head. Take a deep breath and cough. Then, pull back on the syringe. If you still do not see blood, flush the catheter with a small amount of solution. Then, change positions again and take a breath or cough. Pull back on the syringe again. If you still do not see   blood, finish flushing the catheter and contact your health care provider. Do not use your catheter until your health care provider says it is okay. General tips  Have someone help you flush your catheter, if possible.  Do not force fluid through your catheter.  Do not use a syringe that is larger or smaller than 10 mL. Using a smaller syringe can make the catheter burst.  Do not use your catheter without flushing it first if it has heparin in it. Contact a health  care provider if:  You cannot see any blood in the catheter when you flush it before using it.  Your catheter is difficult to flush. Get help right away if:  You cannot flush the catheter.  The catheter leaks when you flush it or when there is fluid in it.  There are cracks or breaks in the catheter. Summary  It is important to flush your tunneled central venous catheter each time you use it, both before and after you use it.  Scrub the injection cap for 15 seconds with a disinfecting wipe before and after you flush it.  When you flush your catheter, make sure you follow any specific instructions from your health care provider or the manufacturer.  Get help right away if you cannot flush the catheter. This information is not intended to replace advice given to you by your health care provider. Make sure you discuss any questions you have with your health care provider. Document Revised: 04/23/2019 Document Reviewed: 10/14/2018 Elsevier Patient Education  2020 Elsevier Inc.  

## 2019-11-17 NOTE — Progress Notes (Signed)
Hematology and Oncology Follow Up Visit  MIT ISSAC [id number] 05/25/1930 86 y.o. 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 Palliative radiation therapy to right ilium Kyprolis/Cytoxanq 3 week dosing- s/p cycle 24 (Cytoxan restarted on cycle 13) - DC'd due to progression Radiation therapy to right femur, 10 fraction --completed 3000 rad on 11/06/2018 Elotuzomab - started 10/29/2018 -- s/p cycle #1 -- d/c on 12/24/2018 Pomalidomide 2 mg po q day (21 on/7 off) - started 10/29/2018 -- d/c 12/31/2018 Daratumumab - start cycle # 1 on 12/31/2018 -- d/c on -02/04/2019  Current Therapy:        Bendamustine/Decadron -- started on 02/10/2019, s/p cycle8 Zometa IV q 4 weeks - on hold Aranesp 300 g subcutaneous as needed for hemoglobin less than 10 IVIG 40g IV q month -- start on 07/2019   Interim History:  Mr. Stogdill is here today for follow-up and to restart treatment. He is feeling better each day and is ambulating nicely today with a rolling walker.  He does feel fatigued at times and will take time to rest when needed.  Hgb is down a good bit today at 9.7, MCV 93.  Since being hospitalized with pneumonia and going through rehab he was unable to follow-up with GI after his endoscopy in February. He states that he never started the Diflucan or Protonix but will start today.  He was found to have an oozing ulcer that was clipped, a hiatal hernia and fungal elements noted.  He has also been seeing speech therapy since his stroke and they plan to schedule him for a swallowing study.  He has made an appointment with his eye doctor. He has been having "crud" in the inner corner of the left eye and with scratching  he now has a subconjunctival hemorrhage in that area. He has not noted any bleeding. No bruising or petechiae. Platelet count is stable at 146. He has occasional episodes of mild SOB with over exertion.  No fever, chills, n/v, cough, rash, dizziness, chest pain, palpitations, abdominal pain or changes in bladder habits.  He has urgency at times with his bowel movements and has had a few episodes of incontinence. He has not tried taking imodium.  No swelling in his extremities.  The neuropathy in his hands is unchanged but his has noted it being a little worse in his feet.  No falls or syncopal episodes to report.  He states that he is eating well and have a snack before bed as well. He is staying hydrated. His weight is stable since his last visit.   ECOG Performance Status: 2 - Symptomatic, <50% confined to bed  Medications:  Allergies as of 11/17/2019      Reactions   Sulfa Antibiotics Itching   Sulfasalazine Itching      Medication List       Accurate as of November 17, 2019 11:28 AM. If you have any questions, ask your nurse or doctor.        ALOXI IV Inject into the vein.   amLODipine-benazepril 5-10 MG capsule Commonly known as: LOTREL TAKE ONE CAPSULE EACH DAY What changed: See the new instructions.   bendamustine in sodium chloride 0.9 % 50 mL Inject into the vein once.   dronabinol  5 MG capsule Commonly known as: MARINOL TAKE ONE CAPSULE TWICE DAILY BEFORE MEALS   finasteride 5 MG tablet Commonly known as: PROSCAR TAKE ONE TABLET EACH DAY What changed: See the new instructions.   lidocaine-prilocaine cream Commonly known as: EMLA Apply 1 application topically as needed.   ondansetron 4 MG tablet Commonly known as: ZOFRAN Take 1 tablet (4 mg total) by mouth every 6 (six) hours as needed for nausea.   pantoprazole 40 MG tablet Commonly known as: Protonix Take 1 tablet (40 mg total) by mouth 2 (two) times daily.   penicillin v potassium 500 MG  tablet Commonly known as: VEETID Take 1 tablet (500 mg total) by mouth 4 (four) times daily.   polyvinyl alcohol 1.4 % ophthalmic solution Commonly known as: LIQUIFILM TEARS Place 1 drop into both eyes as needed for dry eyes.   predniSONE 20 MG tablet Commonly known as: Deltasone Take 1 tablet (20 mg total) by mouth daily with breakfast.   PROBIOTIC PO Take 1 capsule by mouth daily.   pyridOXINE 100 MG tablet Commonly known as: VITAMIN B-6 Take 100 mg by mouth daily.   QC MULTI-VITE 50 & OVER PO TAKE ONE TABLET DAILY   QC Multi-Vite 50 & Over Tabs Take 1 tablet by mouth daily.   traMADol 50 MG tablet Commonly known as: ULTRAM Take 1 tablet (50 mg total) by mouth every 8 (eight) hours as needed for moderate pain.   vitamin B-12 500 MCG tablet Commonly known as: CYANOCOBALAMIN Take 500 mcg by mouth daily.   Vitamin D (Ergocalciferol) 1.25 MG (50000 UNIT) Caps capsule Commonly known as: DRISDOL Take 50,000 Units by mouth every Sunday.       Allergies:  Allergies  Allergen Reactions  . Sulfa Antibiotics Itching  . Sulfasalazine Itching    Past Medical History, Surgical history, Social history, and Family History were reviewed and updated.  Review of Systems: All other 10 point review of systems is negative.   Physical Exam:  height is 5\' 10"  (1.778 m) and weight is 128 lb 12.8 oz (58.4 kg). His temporal temperature is 97.7 F (36.5 C). His blood pressure is 171/77 (abnormal) and his pulse is 72. His respiration is 18 and oxygen saturation is 100%.   Wt Readings from Last 3 Encounters:  11/17/19 128 lb 12.8 oz (58.4 kg)  10/27/19 129 lb 1.9 oz (58.6 kg)  09/28/19 140 lb (63.5 kg)    Ocular: left eye has subconjunctival hemorrhage in the inner corner, pupils equal, round and reactive to light Ear-nose-throat: Oropharynx clear, dentition fair Lymphatic: No cervical or supraclavicular adenopathy Lungs no rales or rhonchi, good excursion bilaterally Heart  regular rate and rhythm, no murmur appreciated Abd soft, nontender, positive bowel sounds, no liver or spleen tip palpated on exam, no fluid wave  MSK no focal spinal tenderness, no joint edema Neuro: non-focal, well-oriented, appropriate affect Breasts: Deferred   Lab Results  Component Value Date   WBC 6.1 11/17/2019   HGB 9.7 (L) 11/17/2019   HCT 29.2 (L) 11/17/2019   MCV 93.0 11/17/2019   PLT 146 (L) 11/17/2019   Lab Results  Component Value Date   FERRITIN 547 (H) 08/31/2019   IRON 107 08/31/2019   TIBC 220 08/31/2019   UIBC 113 (L) 08/31/2019   IRONPCTSAT 49 08/31/2019   Lab Results  Component Value Date   RETICCTPCT 1.3 12/10/2018   RBC 3.14 (L) 11/17/2019   Lab Results  Component Value Date   KPAFRELGTCHN 13.4 10/27/2019  LAMBDASER 124.3 (H) 10/27/2019   KAPLAMBRATIO 0.11 (L) 10/27/2019   Lab Results  Component Value Date   IGGSERUM 1,262 10/27/2019   IGGSERUM 1,397 10/27/2019   IGA 53 (L) 10/27/2019   IGA 55 (L) 10/27/2019   IGMSERUM 416 (H) 10/27/2019   IGMSERUM 454 (H) 10/27/2019   Lab Results  Component Value Date   TOTALPROTELP 5.5 (L) 10/27/2019   ALBUMINELP 2.5 (L) 10/27/2019   A1GS 0.3 10/27/2019   A2GS 0.7 10/27/2019   BETS 0.7 10/27/2019   BETA2SER 0.3 07/28/2015   GAMS 1.3 10/27/2019   MSPIKE 0.4 (H) 10/27/2019   SPEI Comment 08/31/2019     Chemistry      Component Value Date/Time   NA 141 11/01/2019 1314   NA 146 (H) 08/07/2017 1153   NA 141 01/09/2017 1004   K 3.4 (L) 11/01/2019 1314   K 4.6 08/07/2017 1153   K 4.4 01/09/2017 1004   CL 108 11/01/2019 1314   CL 108 08/07/2017 1153   CO2 25 11/01/2019 1314   CO2 26 08/07/2017 1153   CO2 22 01/09/2017 1004   BUN 31 (H) 11/01/2019 1314   BUN 24 (H) 08/07/2017 1153   BUN 23.9 01/09/2017 1004   CREATININE 1.26 (H) 11/01/2019 1314   CREATININE 1.36 (H) 10/27/2019 0833   CREATININE 1.56 (H) 05/26/2018 1508   CREATININE 1.5 (H) 01/09/2017 1004      Component Value Date/Time    CALCIUM 8.6 (L) 11/01/2019 1314   CALCIUM 8.8 08/07/2017 1153   CALCIUM 8.8 01/09/2017 1004   ALKPHOS 48 10/27/2019 0833   ALKPHOS 97 (H) 08/07/2017 1153   ALKPHOS 80 01/09/2017 1004   AST 23 10/27/2019 0833   AST 18 01/09/2017 1004   ALT 14 10/27/2019 0833   ALT 20 08/07/2017 1153   ALT 12 01/09/2017 1004   BILITOT 0.5 10/27/2019 0833   BILITOT 0.92 01/09/2017 1004       Impression and Plan: Mr. Lara is a very pleasant 86yo caucasian gentleman with relapsed IgG lambda myeloma.  He is looking and feeling much better.  We will resume treatment today per Dr. Marin Olp.  We contacted GI and they will see him tomorrow for follow-up.  Iron studies are pending. We will replace next week if needed. He will get Aranesp today.  We will se him again in another 4 weeks.  He will contact our office with nay questions or concerns. We can certainly see him sooner if needed.   Laverna Peace, NP 4/7/202111:28 AM

## 2019-11-18 ENCOUNTER — Encounter: Payer: Self-pay | Admitting: Nurse Practitioner

## 2019-11-18 ENCOUNTER — Inpatient Hospital Stay: Payer: Medicare HMO

## 2019-11-18 ENCOUNTER — Ambulatory Visit: Payer: Medicare HMO | Admitting: Nurse Practitioner

## 2019-11-18 VITALS — BP 162/82 | HR 70 | Temp 98.0°F | Resp 18

## 2019-11-18 VITALS — BP 160/82 | HR 80 | Temp 97.1°F | Ht 70.0 in | Wt 137.2 lb

## 2019-11-18 DIAGNOSIS — C9002 Multiple myeloma in relapse: Secondary | ICD-10-CM

## 2019-11-18 DIAGNOSIS — C9000 Multiple myeloma not having achieved remission: Secondary | ICD-10-CM

## 2019-11-18 DIAGNOSIS — B3781 Candidal esophagitis: Secondary | ICD-10-CM | POA: Diagnosis not present

## 2019-11-18 DIAGNOSIS — K264 Chronic or unspecified duodenal ulcer with hemorrhage: Secondary | ICD-10-CM

## 2019-11-18 LAB — IGG, IGA, IGM
IgA: 26 mg/dL — ABNORMAL LOW (ref 61–437)
IgG (Immunoglobin G), Serum: 923 mg/dL (ref 603–1613)
IgM (Immunoglobulin M), Srm: 213 mg/dL — ABNORMAL HIGH (ref 15–143)

## 2019-11-18 LAB — PROTEIN ELECTROPHORESIS, SERUM
A/G Ratio: 1 (ref 0.7–1.7)
Albumin ELP: 2.7 g/dL — ABNORMAL LOW (ref 2.9–4.4)
Alpha-1-Globulin: 0.2 g/dL (ref 0.0–0.4)
Alpha-2-Globulin: 0.8 g/dL (ref 0.4–1.0)
Beta Globulin: 0.8 g/dL (ref 0.7–1.3)
Gamma Globulin: 0.9 g/dL (ref 0.4–1.8)
Globulin, Total: 2.7 g/dL (ref 2.2–3.9)
M-Spike, %: 0.4 g/dL — ABNORMAL HIGH
Total Protein ELP: 5.4 g/dL — ABNORMAL LOW (ref 6.0–8.5)

## 2019-11-18 LAB — KAPPA/LAMBDA LIGHT CHAINS
Kappa free light chain: 5.5 mg/L (ref 3.3–19.4)
Kappa, lambda light chain ratio: 0.1 — ABNORMAL LOW (ref 0.26–1.65)
Lambda free light chains: 55.9 mg/L — ABNORMAL HIGH (ref 5.7–26.3)

## 2019-11-18 LAB — FERRITIN: Ferritin: 412 ng/mL — ABNORMAL HIGH (ref 24–336)

## 2019-11-18 LAB — IRON AND TIBC
Iron: 104 ug/dL (ref 42–163)
Saturation Ratios: 47 % (ref 20–55)
TIBC: 224 ug/dL (ref 202–409)
UIBC: 119 ug/dL (ref 117–376)

## 2019-11-18 MED ORDER — SODIUM CHLORIDE 0.9 % IV SOLN
Freq: Once | INTRAVENOUS | Status: AC
  Filled 2019-11-18: qty 250

## 2019-11-18 MED ORDER — SODIUM CHLORIDE 0.9% FLUSH
10.0000 mL | INTRAVENOUS | Status: DC | PRN
  Administered 2019-11-18: 10 mL
  Filled 2019-11-18: qty 10

## 2019-11-18 MED ORDER — SODIUM CHLORIDE 0.9 % IV SOLN
10.0000 mg | Freq: Once | INTRAVENOUS | Status: AC
  Administered 2019-11-18: 10 mg via INTRAVENOUS
  Filled 2019-11-18: qty 10

## 2019-11-18 MED ORDER — SODIUM CHLORIDE 0.9 % IV SOLN
75.0000 mg/m2 | Freq: Once | INTRAVENOUS | Status: AC
  Administered 2019-11-18: 125 mg via INTRAVENOUS
  Filled 2019-11-18: qty 5

## 2019-11-18 MED ORDER — HEPARIN SOD (PORK) LOCK FLUSH 100 UNIT/ML IV SOLN
500.0000 [IU] | Freq: Once | INTRAVENOUS | Status: AC | PRN
  Administered 2019-11-18: 13:00:00 500 [IU]
  Filled 2019-11-18: qty 5

## 2019-11-18 NOTE — Progress Notes (Signed)
Ok to treat with BP 162/82 per Dr. Marin Olp

## 2019-11-18 NOTE — Patient Instructions (Addendum)
1. Protonix (Pantoprazole) Take one tablet 30 minutes before breakfast and 30 minutes before dinner for 8 weeks.   2. Diflucan take two tablets on day 1, then take one tablet daily for 3 weeks.    Please go to the lab at [facility] ([place] You will need to go to level "B", you do not need an appointment for this. Hours available are 7:30 am - 4:30 pm.  You will need to do this on blood work on or around 12/02/19.    Call our office at [phone] if you see black stools.    Follow up with Dr. Bryan Lemma in 8 weeks.

## 2019-11-18 NOTE — Progress Notes (Signed)
11/18/2019 Christian Burns [id number] August 26, 1929   Chief Complaint: follow up ulcers   History of Present Illness: Christian Burns is an 86 year old male with a past medical history of hypertension, CVA, CKD stage III, multiple myeloma s/p chemo, jaw osteomyelitis, IDA and UGI bleed 09/2019.  He was seen in office by Dr. Bryan Lemma on 2/2 for further evaluation for melena and anemia with Hg 5.3. He was seen by Dr. Marin Olp earlier that day and he received 1 unit of PRBCs. An EGD was done 09/17/2019 as an outpatient by Dr. Bryan Lemma which identified esophageal plaques consistent with candidiasis esophagitis, non obstructing Schatzki's ring, bleb in the esophagus, 2cm hiatal hernia, gastritis, hematin in the entire stomach an oozing duodenal ulcers, clips were placed. Biopsies confirmed fungal elements to the esophagus, mild reactive gastropathy without evidence of H. Pylor and peptic duodenitis. He was prescribed Protonix 40m po bid x 8 week and Difulcan 4063mx 1 then 20060mo daily x 3 weeks, however, he stated he never started these prescriptions. He isn't sure why he didn't start them. He went to the pharmacy and he picked up the Protonix and Diflucan but he didn't take them. His wife was called during this appointment and she verified both medications were at their house but he didn't take them. [facility] but they manage their own medications.   He developed a fever and cough. [facility]. WBC 2.7. Hg 6.9. HCT 20.8. A chest xray showed multifocal pneumonia with SIRS. Covid 19 was negative. He required 02 4L [place].  He initially received Vanco, Ceftriaxone and Zithromax IV. He received 2 units of PRBCs and his Hg increased to 11.2.  Blood cultures showed gram-positive rod  bacteremia-gram-positive rods growing Clostridium clostridioforme in anaerobic bottle. ID was consulted and recommended Augmentin. 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   Currently, he feels less fatigued. No cough or SOB. He does not require home oxygen. He has intermittent heartburn, a few days then no heartburn for 1 or 2 weeks. No dysphagia. He eats a snack at 9:30pm most nights and goes to bed after 11pm.  No upper or lower abdominal pain. No black stools. No rectal bleeding. He is passing 1 to 2 soft brown BMs daily or no BM for a day. He has increased urgency and sometimes soils himself before he gets to the bathroom.  His most recent labs done 11/17/2019 show Hg 9.7 down from 11.7. Iron 104. Ferritin 412. He remains on Prednisone 20mg5mly.    CBC Latest Ref Rng & Units 11/17/2019 11/01/2019 10/27/2019  WBC 4.0 - 10.5 K/uL 6.1 5.3 4.3  Hemoglobin 13.0 - 17.0 g/dL 9.7(L) 11.7(L) 10.8(L)  Hematocrit 39.0 - 52.0 % 29.2(L) 35.6(L) 32.8(L)  Platelets 150 - 400 K/uL 146(L) 147(L) 140(L)    CMP Latest Ref Rng & Units 11/17/2019 11/01/2019 10/27/2019  Glucose 70 - 99 mg/dL 122(H) 84 111(H)  BUN 8 - 23 mg/dL 37(H) 31(H) 36(H)  Creatinine 0.61 - 1.24 mg/dL 1.36(H) 1.26(H) 1.36(H)  Sodium 135 - 145 mmol/L 139 141 141  Potassium 3.5 - 5.1 mmol/L 4.2 3.4(L) 3.9  Chloride 98 - 111 mmol/L 109 108 107  CO2 22 - 32 mmol/L _0 Calcium 8.9 - 10.3 mg/dL 8.6(L)  8.6(L) 8.7(L)  Total Protein 6.5 - 8.1 g/dL 5.6(L) - 5.8(L)  Total Bilirubin 0.3 - 1.2 mg/dL 0.5 - 0.5  Alkaline Phos 38 - 126 U/L 51 - 48  AST 15 - 41 U/L 25 - 23  ALT 0 - 44 U/L 13 - 14   Allergies  Allergen Reactions  . Sulfa Antibiotics Itching  . Sulfasalazine Itching    Current Outpatient Medications on File Prior to Visit  Medication Sig Dispense Refill  . amLODipine-benazepril (LOTREL) 5-10 MG capsule TAKE ONE CAPSULE EACH DAY (Patient taking differently: Take 1 capsule by mouth daily. ) 30 capsule 2  . bendamustine in sodium chloride 0.9 % 50 mL Inject into the  vein once.    . dronabinol (MARINOL) 5 MG capsule TAKE ONE CAPSULE TWICE DAILY BEFORE MEALS 60 capsule 0  . finasteride (PROSCAR) 5 MG tablet TAKE ONE TABLET EACH DAY (Patient taking differently: Take 5 mg by mouth daily. ) 30 tablet 6  . lidocaine-prilocaine (EMLA) cream Apply 1 application topically as needed. 30 g 0  . Multiple Vitamins-Minerals (QC MULTI-VITE 50 & OVER PO) TAKE ONE TABLET DAILY    . ondansetron (ZOFRAN) 4 MG tablet Take 1 tablet (4 mg total) by mouth every 6 (six) hours as needed for nausea. 20 tablet 0  . Palonosetron HCl (ALOXI IV) Inject into the vein.    Marland Kitchen penicillin v potassium (VEETID) 500 MG tablet Take 1 tablet (500 mg total) by mouth 4 (four) times daily.    . polyvinyl alcohol (LIQUIFILM TEARS) 1.4 % ophthalmic solution Place 1 drop into both eyes as needed for dry eyes. 15 mL 0  . predniSONE (DELTASONE) 20 MG tablet Take 1 tablet (20 mg total) by mouth daily with breakfast. 30 tablet 2  . Probiotic Product (PROBIOTIC PO) Take 1 capsule by mouth daily.    Marland Kitchen pyridOXINE (VITAMIN B-6) 100 MG tablet Take 100 mg by mouth daily.     . traMADol (ULTRAM) 50 MG tablet Take 1 tablet (50 mg total) by mouth every 8 (eight) hours as needed for moderate pain. 10 tablet 0  . vitamin B-12 (CYANOCOBALAMIN) 500 MCG tablet Take 500 mcg by mouth daily.    . Vitamin D, Ergocalciferol, (DRISDOL) 50000 UNITS CAPS capsule Take 50,000 Units by mouth every Sunday.     . calcium carbonate (TUMS) 500 MG chewable tablet Chew 1 tablet by mouth as needed for indigestion or heartburn.    . pantoprazole (PROTONIX) 40 MG tablet Take 1 tablet (40 mg total) by mouth 2 (two) times daily. (Patient not taking: Reported on 11/18/2019) 60 tablet 3   No current facility-administered medications on file prior to visit.    Current Medications, Allergies, Past Medical History, Past Surgical History, Family History and Social History were reviewed in [employer] record.   Physical  Exam: BP (!) 160/82   Pulse 80   Temp (!) 97.1 F (36.2 C)   Ht '5\' 10"'$  (1.778 m)   Wt 137 lb 4 oz (62.3 kg)   BMI 19.69 kg/m  General: 86 year old male in no acute distress, ambulating with the assistance of a walker.  Head: Normocephalic and atraumatic. Eyes: No scleral icterus. Conjunctiva pink . Ears: Christian auditory acuity. Lungs: Clear throughout to auscultation. Heart: Regular rate and rhythm, no murmur. Chest: Port to right chest wall intact.  Abdomen: Soft, nontender and nondistended. No masses or hepatomegaly. Christian bowel sounds x 4 quadrants.  Rectal: No external hemorrhoids. Enlarged prostate assessed left  lateral position. Light brown stool with mucous trace guaiac positive. No frank melena.  Musculoskeletal: Symmetrical with no gross deformities. Extremities: No edema. Neurological: Alert oriented x 4. No focal deficits.  Psychological: Alert and cooperative. Christian mood and affect  Assessment and Recommendations:  41. 86 year old male with a complex medical history including multiple myeloma with UGI bleed and anemia 09/14/2019. Transfused 1 unit of PRBCs 2/2. S/P EGD 2/5 showed candidiasis esophagitis, hematin in the stomach and oozing duodenal ulcers, a clip was placed. He was prescribed Pantoprazole 34m bid x 8 weeks and Diflucan 4041mx 1 day then 20054maily x 3 weeks but he did not take these medications. Hg dropped to 6.9 during his hospital admission for pneumonia and gram + bacteremia. He received 2 units of PRBCs during his hospital admission 2/16 -  2/22.  Hg 11.7 down to 9.7 on 4/7. No further melena. Rectal exam today was trace guaiac positive.  -Patient to take Pantoprazole 31m63m bid x 8 week and Diflucan 400mg46m dose then 200mg 41my x 3 weeks as previously prescribed -Follow up in the office with Dr. CiriglBryan Lemmaweeks  -Repeat CBC in 1 week -Patient to call our office if he sees black stool  2. 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 EnnevrAvelino Leeds

## 2019-11-18 NOTE — Patient Instructions (Signed)
Bendamustine Injection What is this medicine? BENDAMUSTINE (BEN da MUS teen) is a chemotherapy drug. It is used to treat chronic lymphocytic leukemia and non-Hodgkin lymphoma. This medicine may be used for other purposes; ask your health care provider or pharmacist if you have questions. COMMON BRAND NAME(S): BELRAPZO, BENDEKA, Treanda What should I tell my health care provider before I take this medicine? They need to know if you have any of these conditions:  infection (especially a virus infection such as chickenpox, cold sores, or herpes)  kidney disease  liver disease  an unusual or allergic reaction to bendamustine, mannitol, other medicines, foods, dyes, or preservatives  pregnant or trying to get pregnant  breast-feeding How should I use this medicine? This medicine is for infusion into a vein. It is given by a health care professional in a hospital or clinic setting. Talk to your pediatrician regarding the use of this medicine in children. Special care may be needed. Overdosage: If you think you have taken too much of this medicine contact a poison control center or emergency room at once. NOTE: This medicine is only for you. Do not share this medicine with others. What if I miss a dose? It is important not to miss your dose. Call your doctor or health care professional if you are unable to keep an appointment. What may interact with this medicine? Do not take this medicine with any of the following medications:  clozapine This medicine may also interact with the following medications:  atazanavir  cimetidine  ciprofloxacin  enoxacin  fluvoxamine  medicines for seizures like carbamazepine and phenobarbital  mexiletine  rifampin  tacrine  thiabendazole  zileuton This list may not describe all possible interactions. Give your health care provider a list of all the medicines, herbs, non-prescription drugs, or dietary supplements you use. Also tell them if  you smoke, drink alcohol, or use illegal drugs. Some items may interact with your medicine. What should I watch for while using this medicine? This drug may make you feel generally unwell. This is not uncommon, as chemotherapy can affect healthy cells as well as cancer cells. Report any side effects. Continue your course of treatment even though you feel ill unless your doctor tells you to stop. You may need blood work done while you are taking this medicine. Call your doctor or healthcare provider for advice if you get a fever, chills or sore throat, or other symptoms of a cold or flu. Do not treat yourself. This drug decreases your body's ability to fight infections. Try to avoid being around people who are sick. This medicine may cause serious skin reactions. They can happen weeks to months after starting the medicine. Contact your healthcare provider right away if you notice fevers or flu-like symptoms with a rash. The rash may be red or purple and then turn into blisters or peeling of the skin. Or, you might notice a red rash with swelling of the face, lips or lymph nodes in your neck or under your arms. This medicine may increase your risk to bruise or bleed. Call your doctor or healthcare provider if you notice any unusual bleeding. Talk to your doctor about your risk of cancer. You may be more at risk for certain types of cancers if you take this medicine. Do not become pregnant while taking this medicine or for at least 6 months after stopping it. Women should inform their doctor if they wish to become pregnant or think they might be pregnant. Men should not   father a child while taking this medicine and for at least 3 months after stopping it. There is a potential for serious side effects to an unborn child. Talk to your healthcare provider or pharmacist for more information. Do not breast-feed an infant while taking this medicine or for at least 1 week after stopping it. This medicine may make it  more difficult to father a child. You should talk with your doctor or healthcare provider if you are concerned about your fertility. What side effects may I notice from receiving this medicine? Side effects that you should report to your doctor or health care professional as soon as possible:  allergic reactions like skin rash, itching or hives, swelling of the face, lips, or tongue  low blood counts - this medicine may decrease the number of white blood cells, red blood cells and platelets. You may be at increased risk for infections and bleeding.  rash, fever, and swollen lymph nodes  redness, blistering, peeling, or loosening of the skin, including inside the mouth  signs of infection like fever or chills, cough, sore throat, pain or difficulty passing urine  signs of decreased platelets or bleeding like bruising, pinpoint red spots on the skin, black, tarry stools, blood in the urine  signs of decreased red blood cells like being unusually weak or tired, fainting spells, lightheadedness  signs and symptoms of kidney injury like trouble passing urine or change in the amount of urine  signs and symptoms of liver injury like dark yellow or brown urine; general ill feeling or flu-like symptoms; light-colored stools; loss of appetite; nausea; right upper belly pain; unusually weak or tired; yellowing of the eyes or skin Side effects that usually do not require medical attention (report to your doctor or health care professional if they continue or are bothersome):  constipation  decreased appetite  diarrhea  headache  mouth sores  nausea, vomiting  tiredness This list may not describe all possible side effects. Call your doctor for medical advice about side effects. You may report side effects to FDA at 1-800-FDA-1088. Where should I keep my medicine? This drug is given in a hospital or clinic and will not be stored at home. NOTE: This sheet is a summary. It may not cover all  possible information. If you have questions about this medicine, talk to your doctor, pharmacist, or health care provider.  2020 [employer]vier/Gold Standard (2018-10-20 10:26:46)  

## 2019-11-22 NOTE — Progress Notes (Signed)
Agree with the assessment and plan as outlined by Colleen Kennedy-Smith, NP.   Bri Wakeman, DO, FACG [facility]   

## 2019-11-23 ENCOUNTER — Other Ambulatory Visit: Payer: Self-pay | Admitting: Hematology & Oncology

## 2019-11-24 ENCOUNTER — Ambulatory Visit: Payer: Medicare HMO | Admitting: Internal Medicine

## 2019-11-24 ENCOUNTER — Encounter: Payer: Self-pay | Admitting: Internal Medicine

## 2019-11-24 ENCOUNTER — Other Ambulatory Visit: Payer: Self-pay

## 2019-11-24 VITALS — BP 169/79 | HR 79 | Temp 97.4°F | Wt 138.0 lb

## 2019-11-24 DIAGNOSIS — M272 Inflammatory conditions of jaws: Secondary | ICD-10-CM | POA: Diagnosis not present

## 2019-11-24 DIAGNOSIS — R1310 Dysphagia, unspecified: Secondary | ICD-10-CM

## 2019-11-24 DIAGNOSIS — Z8673 Personal history of transient ischemic attack (TIA), and cerebral infarction without residual deficits: Secondary | ICD-10-CM | POA: Diagnosis not present

## 2019-11-24 DIAGNOSIS — C9002 Multiple myeloma in relapse: Secondary | ICD-10-CM | POA: Diagnosis not present

## 2019-11-24 NOTE — Progress Notes (Signed)
RFV: chronic jaw osteo  Patient ID: Christian Burns, male   DOB: 1929-10-26, 86 y.o.   MRN: [id number]  HPI Christian Burns is a 86yo M with chronic jaw osteo has been on penicillin. He reports having Dysphagia since his stroke. Has PT/OT/ and speech evaluation  Finished up treatment for pneumonia- hospitalization last month.  Recently chnaged his myeloma treatment due to kidney issues -   Chronic jaw osteo on penicillin  Tolerated covid vaccine  Outpatient Encounter Medications as of 11/24/2019  Medication Sig  . amLODipine-benazepril (LOTREL) 5-10 MG capsule TAKE ONE CAPSULE EACH DAY (Patient taking differently: Take 1 capsule by mouth daily. )  . bendamustine in sodium chloride 0.9 % 50 mL Inject into the vein once.  . calcium carbonate (TUMS) 500 MG chewable tablet Chew 1 tablet by mouth as needed for indigestion or heartburn.  . dronabinol (MARINOL) 5 MG capsule TAKE ONE CAPSULE TWICE DAILY BEFORE MEALS  . finasteride (PROSCAR) 5 MG tablet TAKE ONE TABLET EACH DAY (Patient taking differently: Take 5 mg by mouth daily. )  . lidocaine-prilocaine (EMLA) cream Apply 1 application topically as needed.  . Multiple Vitamins-Minerals (QC MULTI-VITE 50 & OVER PO) TAKE ONE TABLET DAILY  . ondansetron (ZOFRAN) 4 MG tablet Take 1 tablet (4 mg total) by mouth every 6 (six) hours as needed for nausea.  . Palonosetron HCl (ALOXI IV) Inject into the vein.  Marland Kitchen penicillin v potassium (VEETID) 500 MG tablet Take 1 tablet (500 mg total) by mouth 4 (four) times daily.  . polyvinyl alcohol (LIQUIFILM TEARS) 1.4 % ophthalmic solution Place 1 drop into both eyes as needed for dry eyes.  . predniSONE (DELTASONE) 20 MG tablet Take 1 tablet (20 mg total) by mouth daily with breakfast.  . Probiotic Product (PROBIOTIC PO) Take 1 capsule by mouth daily.  Marland Kitchen pyridOXINE (VITAMIN B-6) 100 MG tablet Take 100 mg by mouth daily.   . traMADol (ULTRAM) 50 MG tablet Take 1 tablet (50 mg total) by mouth every 8 (eight) hours as  needed for moderate pain.  . vitamin B-12 (CYANOCOBALAMIN) 500 MCG tablet Take 500 mcg by mouth daily.  . Vitamin D, Ergocalciferol, (DRISDOL) 50000 UNITS CAPS capsule Take 50,000 Units by mouth every Sunday.   . pantoprazole (PROTONIX) 40 MG tablet Take 1 tablet (40 mg total) by mouth 2 (two) times daily. (Patient not taking: Reported on 11/18/2019)   No facility-administered encounter medications on file as of 11/24/2019.     Patient Active Problem List   Diagnosis Date Noted  . SIRS (systemic inflammatory response syndrome) ([place]) 09/28/2019  . Multifocal pneumonia 09/28/2019  . Anemia associated with chemotherapy 09/28/2019  . Protein-calorie malnutrition, severe 06/24/2019  . Leukopenia due to antineoplastic chemotherapy ([place]) 06/23/2019  . Nausea 06/23/2019  . Diarrhea 06/23/2019  . CKD (chronic kidney disease), stage III 06/23/2019  . HCAP (healthcare-associated pneumonia) 06/22/2019  . Hyperlipidemia LDL goal <70 04/20/2019  . Essential hypertension 04/20/2019  . Advanced age 12/18/2018  . Stroke ([place]) 04/19/2019  . Community acquired pneumonia 03/03/2019  . AKI (acute kidney injury) ([place]) 03/03/2019  . Normochromic normocytic anemia 03/03/2019  . Goals of care, counseling/discussion 02/04/2019  . Acute osteomyelitis of jaw 11/26/2018  . Bacteremia   . 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 Sepsis due to undetermined organism ([place])   . Left lower lobe pneumonia 10/06/2018  . Right hip pain 09/01/2018  . Iron deficiency anemia secondary to inadequate dietary iron intake 07/18/2016  . Multiple myeloma in  relapse ([place]) 04/05/2016  . 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 Multiple myeloma in remission ([place]) 09/08/2015  . Myeloma ([place]) 08/23/2011     Health Maintenance Due  Topic Date Due  . TETANUS/TDAP  Never done  . PNA vac Low Risk Adult (1 of 2 - PCV13) Never done     Review of Systems Review of Systems  Constitutional: Negative for fever,  chills, diaphoresis, activity change, appetite change, fatigue and unexpected weight change.  HENT: Negative for congestion, sore throat, rhinorrhea, sneezing, trouble swallowing and sinus pressure.  Eyes: Negative for photophobia and visual disturbance.  Respiratory: Negative for cough, chest tightness, shortness of breath, wheezing and stridor.  Cardiovascular: Negative for chest pain, palpitations and leg swelling.  Gastrointestinal: Negative for nausea, vomiting, abdominal pain, diarrhea, constipation, blood in stool, abdominal distention and anal bleeding.  Genitourinary: Negative for dysuria, hematuria, flank pain and difficulty urinating.  Musculoskeletal: Negative for myalgias, back pain, joint swelling, arthralgias and gait problem.  Skin: Negative for color change, pallor, rash and wound.  Neurological: +slur and some dysphagia since stroke Hematological: Negative for adenopathy. Does not bruise/bleed easily.  Psychiatric/Behavioral: Negative for behavioral problems, confusion, sleep disturbance, dysphoric mood, decreased concentration and agitation.    Physical Exam   BP (!) 169/79   Pulse 79   Temp (!) 97.4 F (36.3 C) (Oral)   Wt 138 lb (62.6 kg)   BMI 19.80 kg/m    Physical Exam  Constitutional: He is oriented to person, place, and time. He appears well-developed and well-nourished. No distress.  HENT:  Mouth/Throat: Oropharynx is clear and moist. No oropharyngeal exudate.  Cardiovascular: Normal rate, regular rhythm and normal heart sounds. Exam reveals no gallop and no friction rub.  No murmur heard.  Pulmonary/Chest: Effort normal and breath sounds normal. No respiratory distress. He has no wheezes.  Abdominal: Soft. Bowel sounds are normal. He exhibits no distension. There is no tenderness.  Lymphadenopathy:  He has no cervical adenopathy.  Neurological: He is alert and oriented to person, place, and time.  Skin: Skin is warm and dry. No rash noted. No erythema.    Psychiatric: He has a normal mood and affect. His behavior is normal.    CBC Lab Results  Component Value Date   WBC 6.1 11/17/2019   RBC 3.14 (L) 11/17/2019   HGB 9.7 (L) 11/17/2019   HCT 29.2 (L) 11/17/2019   PLT 146 (L) 11/17/2019   MCV 93.0 11/17/2019   MCH 30.9 11/17/2019   MCHC 33.2 11/17/2019   RDW 17.3 (H) 11/17/2019   LYMPHSABS 0.2 (L) 11/17/2019   MONOABS 0.2 11/17/2019   EOSABS 0.1 11/17/2019    BMET Lab Results  Component Value Date   NA 139 11/17/2019   K 4.2 11/17/2019   CL 109 11/17/2019   CO2 25 11/17/2019   GLUCOSE 122 (H) 11/17/2019   BUN 37 (H) 11/17/2019   CREATININE 1.36 (H) 11/17/2019   CALCIUM 8.6 (L) 11/17/2019   GFRNONAA 46 (L) 11/17/2019   GFRAA 54 (L) 11/17/2019   Lab Results  Component Value Date   ESRSEDRATE 48 (H) 08/26/2019      Assessment and Plan Dysphagia = has upcoming Swallow study to see if he can continue taking pills. If  Having difficulty, may need crush pills  Jaw osteo = continue on penicillin and mention nerve damage is likely the cause of his  sensitivity- and he follows up with Dr Luretha Rued

## 2019-11-29 ENCOUNTER — Other Ambulatory Visit: Payer: Self-pay | Admitting: Gastroenterology

## 2019-11-29 DIAGNOSIS — B3781 Candidal esophagitis: Secondary | ICD-10-CM

## 2019-12-06 ENCOUNTER — Other Ambulatory Visit: Payer: Self-pay | Admitting: *Deleted

## 2019-12-06 DIAGNOSIS — C9002 Multiple myeloma in relapse: Secondary | ICD-10-CM

## 2019-12-06 DIAGNOSIS — R634 Abnormal weight loss: Secondary | ICD-10-CM

## 2019-12-06 DIAGNOSIS — R112 Nausea with vomiting, unspecified: Secondary | ICD-10-CM

## 2019-12-06 MED ORDER — PANTOPRAZOLE SODIUM 40 MG PO TBEC: 40.0000 mg | DELAYED_RELEASE_TABLET | Freq: Two times a day (BID) | ORAL | 3 refills | Status: DC

## 2019-12-06 NOTE — Telephone Encounter (Signed)
Pt called w/request for refill on protonix . Protonix sent to pt pharmacy

## 2019-12-08 NOTE — Progress Notes (Signed)
Pharmacist Chemotherapy Monitoring - Follow Up Assessment    I verify that I have reviewed each item in the below checklist:  . Regimen for the patient is scheduled for the appropriate day and plan matches scheduled date. Marland Kitchen Appropriate non-routine labs are ordered dependent on drug ordered. . If applicable, additional medications reviewed and ordered per protocol based on lifetime cumulative doses and/or treatment regimen.   Plan for follow-up and/or issues identified: Yes . I-vent associated with next due treatment: Yes . MD and/or nursing notified: No   Kennith Center, Pharm.D., CPP 12/08/2019@3 :20 PM

## 2019-12-09 NOTE — Progress Notes (Signed)
Pharmacist Chemotherapy Monitoring - Follow Up Assessment    I verify that I have reviewed each item in the below checklist:  . Regimen for the patient is scheduled for the appropriate day and plan matches scheduled date. Marland Kitchen Appropriate non-routine labs are ordered dependent on drug ordered. . If applicable, additional medications reviewed and ordered per protocol based on lifetime cumulative doses and/or treatment regimen.   Plan for follow-up and/or issues identified: No . I-vent associated with next due treatment: No . MD and/or nursing notified: No  Yazeed Pryer, Jacqlyn Larsen 12/09/2019 2:48 PM

## 2019-12-10 ENCOUNTER — Other Ambulatory Visit (HOSPITAL_COMMUNITY): Payer: Self-pay

## 2019-12-10 DIAGNOSIS — R1310 Dysphagia, unspecified: Secondary | ICD-10-CM

## 2019-12-14 ENCOUNTER — Ambulatory Visit (HOSPITAL_COMMUNITY)
Admission: RE | Admit: 2019-12-14 | Discharge: 2019-12-14 | Disposition: A | Discharge status: Home / Self Care | Payer: Medicare HMO | Source: Ambulatory Visit | Attending: Internal Medicine | Admitting: Internal Medicine

## 2019-12-14 ENCOUNTER — Other Ambulatory Visit: Payer: Self-pay

## 2019-12-14 DIAGNOSIS — R1310 Dysphagia, unspecified: Secondary | ICD-10-CM | POA: Diagnosis present

## 2019-12-15 ENCOUNTER — Telehealth: Payer: Self-pay | Admitting: Hematology & Oncology

## 2019-12-15 ENCOUNTER — Inpatient Hospital Stay: Payer: Medicare HMO

## 2019-12-15 ENCOUNTER — Inpatient Hospital Stay (HOSPITAL_BASED_OUTPATIENT_CLINIC_OR_DEPARTMENT_OTHER): Payer: Medicare HMO | Admitting: Hematology & Oncology

## 2019-12-15 ENCOUNTER — Inpatient Hospital Stay: Payer: Medicare HMO | Attending: Hematology & Oncology

## 2019-12-15 ENCOUNTER — Other Ambulatory Visit: Payer: Self-pay

## 2019-12-15 ENCOUNTER — Encounter: Payer: Self-pay | Admitting: Hematology & Oncology

## 2019-12-15 VITALS — BP 174/73

## 2019-12-15 VITALS — BP 183/85 | HR 79 | Temp 97.5°F | Resp 18 | Wt 141.5 lb

## 2019-12-15 DIAGNOSIS — N289 Disorder of kidney and ureter, unspecified: Secondary | ICD-10-CM | POA: Insufficient documentation

## 2019-12-15 DIAGNOSIS — Z882 Allergy status to sulfonamides status: Secondary | ICD-10-CM | POA: Diagnosis not present

## 2019-12-15 DIAGNOSIS — C9002 Multiple myeloma in relapse: Secondary | ICD-10-CM | POA: Insufficient documentation

## 2019-12-15 DIAGNOSIS — R58 Hemorrhage, not elsewhere classified: Secondary | ICD-10-CM | POA: Diagnosis not present

## 2019-12-15 DIAGNOSIS — Z5111 Encounter for antineoplastic chemotherapy: Secondary | ICD-10-CM | POA: Insufficient documentation

## 2019-12-15 DIAGNOSIS — D649 Anemia, unspecified: Secondary | ICD-10-CM

## 2019-12-15 DIAGNOSIS — Z79899 Other long term (current) drug therapy: Secondary | ICD-10-CM | POA: Insufficient documentation

## 2019-12-15 DIAGNOSIS — Z7952 Long term (current) use of systemic steroids: Secondary | ICD-10-CM | POA: Diagnosis not present

## 2019-12-15 DIAGNOSIS — C9000 Multiple myeloma not having achieved remission: Secondary | ICD-10-CM

## 2019-12-15 DIAGNOSIS — R0602 Shortness of breath: Secondary | ICD-10-CM | POA: Diagnosis not present

## 2019-12-15 DIAGNOSIS — M7989 Other specified soft tissue disorders: Secondary | ICD-10-CM | POA: Diagnosis not present

## 2019-12-15 DIAGNOSIS — D508 Other iron deficiency anemias: Secondary | ICD-10-CM | POA: Diagnosis not present

## 2019-12-15 DIAGNOSIS — D631 Anemia in chronic kidney disease: Secondary | ICD-10-CM | POA: Diagnosis not present

## 2019-12-15 LAB — CBC WITH DIFFERENTIAL (CANCER CENTER ONLY)
Abs Immature Granulocytes: 0.64 10*3/uL — ABNORMAL HIGH (ref 0.00–0.07)
Basophils Absolute: 0 10*3/uL (ref 0.0–0.1)
Basophils Relative: 0 %
Eosinophils Absolute: 0 10*3/uL (ref 0.0–0.5)
Eosinophils Relative: 1 %
HCT: 28.5 % — ABNORMAL LOW (ref 39.0–52.0)
Hemoglobin: 9.8 g/dL — ABNORMAL LOW (ref 13.0–17.0)
Immature Granulocytes: 11 %
Lymphocytes Relative: 2 %
Lymphs Abs: 0.1 10*3/uL — ABNORMAL LOW (ref 0.7–4.0)
MCH: 31.5 pg (ref 26.0–34.0)
MCHC: 34.4 g/dL (ref 30.0–36.0)
MCV: 91.6 fL (ref 80.0–100.0)
Monocytes Absolute: 0.6 10*3/uL (ref 0.1–1.0)
Monocytes Relative: 11 %
Neutro Abs: 4.4 10*3/uL (ref 1.7–7.7)
Neutrophils Relative %: 75 %
Platelet Count: 94 10*3/uL — ABNORMAL LOW (ref 150–400)
RBC: 3.11 MIL/uL — ABNORMAL LOW (ref 4.22–5.81)
RDW: 17 % — ABNORMAL HIGH (ref 11.5–15.5)
WBC Count: 5.8 10*3/uL (ref 4.0–10.5)
nRBC: 0 % (ref 0.0–0.2)

## 2019-12-15 LAB — CMP (CANCER CENTER ONLY)
ALT: 14 U/L (ref 0–44)
AST: 27 U/L (ref 15–41)
Albumin: 3.3 g/dL — ABNORMAL LOW (ref 3.5–5.0)
Alkaline Phosphatase: 44 U/L (ref 38–126)
Anion gap: 8 (ref 5–15)
BUN: 39 mg/dL — ABNORMAL HIGH (ref 8–23)
CO2: 25 mmol/L (ref 22–32)
Calcium: 8.7 mg/dL — ABNORMAL LOW (ref 8.9–10.3)
Chloride: 109 mmol/L (ref 98–111)
Creatinine: 1.46 mg/dL — ABNORMAL HIGH (ref 0.61–1.24)
GFR, Est AFR Am: 49 mL/min — ABNORMAL LOW (ref >60)
GFR, Estimated: 43 mL/min — ABNORMAL LOW (ref >60)
Glucose, Bld: 116 mg/dL — ABNORMAL HIGH (ref 70–99)
Potassium: 3.8 mmol/L (ref 3.5–5.1)
Sodium: 142 mmol/L (ref 135–145)
Total Bilirubin: 0.7 mg/dL (ref 0.3–1.2)
Total Protein: 5.4 g/dL — ABNORMAL LOW (ref 6.5–8.1)

## 2019-12-15 MED ORDER — SODIUM CHLORIDE 0.9% FLUSH
10.0000 mL | INTRAVENOUS | Status: DC | PRN
  Administered 2019-12-15: 10 mL
  Filled 2019-12-15: qty 10

## 2019-12-15 MED ORDER — PALONOSETRON HCL INJECTION 0.25 MG/5ML
0.2500 mg | Freq: Once | INTRAVENOUS | Status: AC
  Administered 2019-12-15: 0.25 mg via INTRAVENOUS

## 2019-12-15 MED ORDER — EPOETIN ALFA-EPBX 10000 UNIT/ML IJ SOLN
20000.0000 [IU] | Freq: Once | INTRAMUSCULAR | Status: AC
  Administered 2019-12-15: 20000 [IU] via SUBCUTANEOUS

## 2019-12-15 MED ORDER — HEPARIN SOD (PORK) LOCK FLUSH 100 UNIT/ML IV SOLN
500.0000 [IU] | Freq: Once | INTRAVENOUS | Status: AC | PRN
  Administered 2019-12-15: 500 [IU]
  Filled 2019-12-15: qty 5

## 2019-12-15 MED ORDER — SODIUM CHLORIDE 0.9 % IV SOLN
10.0000 mg | Freq: Once | INTRAVENOUS | Status: AC
  Administered 2019-12-15: 10 mg via INTRAVENOUS
  Filled 2019-12-15: qty 10

## 2019-12-15 MED ORDER — SODIUM CHLORIDE 0.9 % IV SOLN
Freq: Once | INTRAVENOUS | Status: AC
  Filled 2019-12-15: qty 250

## 2019-12-15 MED ORDER — SODIUM CHLORIDE 0.9% FLUSH
10.0000 mL | Freq: Once | INTRAVENOUS | Status: AC
  Administered 2019-12-15: 10 mL
  Filled 2019-12-15: qty 10

## 2019-12-15 MED ORDER — SODIUM CHLORIDE 0.9 % IV SOLN
75.0000 mg/m2 | Freq: Once | INTRAVENOUS | Status: AC
  Administered 2019-12-15: 125 mg via INTRAVENOUS
  Filled 2019-12-15: qty 5

## 2019-12-15 MED ORDER — PALONOSETRON HCL INJECTION 0.25 MG/5ML
INTRAVENOUS | Status: AC
  Filled 2019-12-15: qty 5

## 2019-12-15 NOTE — Patient Instructions (Signed)
Bendamustine Injection What is this medicine? BENDAMUSTINE (BEN da MUS teen) is a chemotherapy drug. It is used to treat chronic lymphocytic leukemia and non-Hodgkin lymphoma. This medicine may be used for other purposes; ask your health care provider or pharmacist if you have questions. COMMON BRAND NAME(S): Kristine Royal, Treanda What should I tell my health care provider before I take this medicine? They need to know if you have any of these conditions:  infection (especially a virus infection such as chickenpox, cold sores, or herpes)  kidney disease  liver disease  an unusual or allergic reaction to bendamustine, mannitol, other medicines, foods, dyes, or preservatives  pregnant or trying to get pregnant  breast-feeding How should I use this medicine? This medicine is for infusion into a vein. It is given by a health care professional in a hospital or clinic setting. Talk to your pediatrician regarding the use of this medicine in children. Special care may be needed. Overdosage: If you think you have taken too much of this medicine contact a poison control center or emergency room at once. NOTE: This medicine is only for you. Do not share this medicine with others. What if I miss a dose? It is important not to miss your dose. Call your doctor or health care professional if you are unable to keep an appointment. What may interact with this medicine? Do not take this medicine with any of the following medications:  clozapine This medicine may also interact with the following medications:  atazanavir  cimetidine  ciprofloxacin  enoxacin  fluvoxamine  medicines for seizures like carbamazepine and phenobarbital  mexiletine  rifampin  tacrine  thiabendazole  zileuton This list may not describe all possible interactions. Give your health care provider a list of all the medicines, herbs, non-prescription drugs, or dietary supplements you use. Also tell them if  you smoke, drink alcohol, or use illegal drugs. Some items may interact with your medicine. What should I watch for while using this medicine? This drug may make you feel generally unwell. This is not uncommon, as chemotherapy can affect healthy cells as well as cancer cells. Report any side effects. Continue your course of treatment even though you feel ill unless your doctor tells you to stop. You may need blood work done while you are taking this medicine. Call your doctor or healthcare provider for advice if you get a fever, chills or sore throat, or other symptoms of a cold or flu. Do not treat yourself. This drug decreases your body's ability to fight infections. Try to avoid being around people who are sick. This medicine may cause serious skin reactions. They can happen weeks to months after starting the medicine. Contact your healthcare provider right away if you notice fevers or flu-like symptoms with a rash. The rash may be red or purple and then turn into blisters or peeling of the skin. Or, you might notice a red rash with swelling of the face, lips or lymph nodes in your neck or under your arms. This medicine may increase your risk to bruise or bleed. Call your doctor or healthcare provider if you notice any unusual bleeding. Talk to your doctor about your risk of cancer. You may be more at risk for certain types of cancers if you take this medicine. Do not become pregnant while taking this medicine or for at least 6 months after stopping it. Women should inform their doctor if they wish to become pregnant or think they might be pregnant. Men should not  father a child while taking this medicine and for at least 3 months after stopping it. There is a potential for serious side effects to an unborn child. Talk to your healthcare provider or pharmacist for more information. Do not breast-feed an infant while taking this medicine or for at least 1 week after stopping it. This medicine may make it  more difficult to father a child. You should talk with your doctor or healthcare provider if you are concerned about your fertility. What side effects may I notice from receiving this medicine? Side effects that you should report to your doctor or health care professional as soon as possible:  allergic reactions like skin rash, itching or hives, swelling of the face, lips, or tongue  low blood counts - this medicine may decrease the number of white blood cells, red blood cells and platelets. You may be at increased risk for infections and bleeding.  rash, fever, and swollen lymph nodes  redness, blistering, peeling, or loosening of the skin, including inside the mouth  signs of infection like fever or chills, cough, sore throat, pain or difficulty passing urine  signs of decreased platelets or bleeding like bruising, pinpoint red spots on the skin, black, tarry stools, blood in the urine  signs of decreased red blood cells like being unusually weak or tired, fainting spells, lightheadedness  signs and symptoms of kidney injury like trouble passing urine or change in the amount of urine  signs and symptoms of liver injury like dark yellow or brown urine; general ill feeling or flu-like symptoms; light-colored stools; loss of appetite; nausea; right upper belly pain; unusually weak or tired; yellowing of the eyes or skin Side effects that usually do not require medical attention (report to your doctor or health care professional if they continue or are bothersome):  constipation  decreased appetite  diarrhea  headache  mouth sores  nausea, vomiting  tiredness This list may not describe all possible side effects. Call your doctor for medical advice about side effects. You may report side effects to FDA at 1-800-FDA-1088. Where should I keep my medicine? This drug is given in a hospital or clinic and will not be stored at home. NOTE: This sheet is a summary. It may not cover all  possible information. If you have questions about this medicine, talk to your doctor, pharmacist, or health care provider.  2020 Elsevier/Gold Standard (2018-10-20 10:26:46)  Epoetin Alfa injection What is this medicine? EPOETIN ALFA (e POE e tin AL fa) helps your body make more red blood cells. This medicine is used to treat anemia caused by chronic kidney disease, cancer chemotherapy, or HIV-therapy. It may also be used before surgery if you have anemia. This medicine may be used for other purposes; ask your health care provider or pharmacist if you have questions. COMMON BRAND NAME(S): Epogen, Procrit, Retacrit What should I tell my health care provider before I take this medicine? They need to know if you have any of these conditions:  cancer  heart disease  high blood pressure  history of blood clots  history of stroke  low levels of folate, iron, or vitamin B12 in the blood  seizures  an unusual or allergic reaction to erythropoietin, albumin, benzyl alcohol, hamster proteins, other medicines, foods, dyes, or preservatives  pregnant or trying to get pregnant  breast-feeding How should I use this medicine? This medicine is for injection into a vein or under the skin. It is usually given by a health care professional  in a hospital or clinic setting. If you get this medicine at home, you will be taught how to prepare and give this medicine. Use exactly as directed. Take your medicine at regular intervals. Do not take your medicine more often than directed. It is important that you put your used needles and syringes in a special sharps container. Do not put them in a trash can. If you do not have a sharps container, call your pharmacist or healthcare provider to get one. A special MedGuide will be given to you by the pharmacist with each prescription and refill. Be sure to read this information carefully each time. Talk to your pediatrician regarding the use of this medicine in  children. While this drug may be prescribed for selected conditions, precautions do apply. Overdosage: If you think you have taken too much of this medicine contact a poison control center or emergency room at once. NOTE: This medicine is only for you. Do not share this medicine with others. What if I miss a dose? If you miss a dose, take it as soon as you can. If it is almost time for your next dose, take only that dose. Do not take double or extra doses. What may interact with this medicine? Interactions have not been studied. This list may not describe all possible interactions. Give your health care provider a list of all the medicines, herbs, non-prescription drugs, or dietary supplements you use. Also tell them if you smoke, drink alcohol, or use illegal drugs. Some items may interact with your medicine. What should I watch for while using this medicine? Your condition will be monitored carefully while you are receiving this medicine. You may need blood work done while you are taking this medicine. This medicine may cause a decrease in vitamin B6. You should make sure that you get enough vitamin B6 while you are taking this medicine. Discuss the foods you eat and the vitamins you take with your health care professional. What side effects may I notice from receiving this medicine? Side effects that you should report to your doctor or health care professional as soon as possible:  allergic reactions like skin rash, itching or hives, swelling of the face, lips, or tongue  seizures  signs and symptoms of a blood clot such as breathing problems; changes in vision; chest pain; severe, sudden headache; pain, swelling, warmth in the leg; trouble speaking; sudden numbness or weakness of the face, arm or leg  signs and symptoms of a stroke like changes in vision; confusion; trouble speaking or understanding; severe headaches; sudden numbness or weakness of the face, arm or leg; trouble walking;  dizziness; loss of balance or coordination Side effects that usually do not require medical attention (report to your doctor or health care professional if they continue or are bothersome):  chills  cough  dizziness  fever  headaches  joint pain  muscle cramps  muscle pain  nausea, vomiting  pain, redness, or irritation at site where injected This list may not describe all possible side effects. Call your doctor for medical advice about side effects. You may report side effects to FDA at 1-800-FDA-1088. Where should I keep my medicine? Keep out of the reach of children. Store in a refrigerator between 2 and 8 degrees C (36 and 46 degrees F). Do not freeze or shake. Throw away any unused portion if using a single-dose vial. Multi-dose vials can be kept in the refrigerator for up to 21 days after the initial dose. Throw  away unused medicine. NOTE: This sheet is a summary. It may not cover all possible information. If you have questions about this medicine, talk to your doctor, pharmacist, or health care provider.  2020 Elsevier/Gold Standard (2017-03-07 08:35:19)

## 2019-12-15 NOTE — Progress Notes (Signed)
Hematology and Oncology Follow Up Visit  Christian Burns [id number] [birth year] 86 y.o. 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 Palliative radiation therapy to right ilium Kyprolis/Cytoxanq 3 week dosing- s/p cycle 24 (Cytoxan restarted on cycle 13) - DC'd due to progression Radiation therapy to right femur, 10 fraction --completed 3000 rad on 11/06/2018 Elotuzomab - started 10/29/2018 -- s/p cycle #1 -- d/c on 12/24/2018 Pomalidomide 2 mg po q day (21 on/7 off) - started 10/29/2018 -- d/c 12/31/2018 Daratumumab - start cycle # 1 on 12/31/2018 -- d/c on -02/04/2019  Current Therapy:        Bendamustine/Decadron -- started on 02/10/2019, s/p cycle8 Zometa IV q 4 weeks - on hold Aranesp 300 g subcutaneous as needed for hemoglobin less than 10 IVIG 40g IV q month -- start on 07/2019   Interim History:  Christian Burns is here today for follow-up.  He actually looks quite good.  He has gained about 13 pounds since we last saw him.  He feels better.  He has little bit more energy.  We did do a dose reduction of the bendamustine.  I think this has been helpful.  His last lambda light chain was 5.5 mg/dL.  This is quite remarkable.  I am happy that his quality life is doing better.  He does have swelling in his legs.  I am sure this might be from prednisone.  It also probably is from him being a little anemic.  He is urinating quite a bit already.  We may have to think about a small amount of diuretic.  Again we have to be very cautious with him as he really cannot tolerate fluid shifts to any great extent.  He has had no bleeding.  He has had a couple ecchymoses.  He said no chest pain.  Is been no increase  shortness of breath.  He has had no cough.  Overall, his performance status is ECOG 2.    Medications:  Allergies as of 12/15/2019      Reactions   Sulfa Antibiotics Itching   Sulfasalazine Itching      Medication List       Accurate as of Dec 15, 2019  1:33 PM. If you have any questions, ask your nurse or doctor.        ALOXI IV Inject into the vein.   amLODipine-benazepril 5-10 MG capsule Commonly known as: LOTREL TAKE ONE CAPSULE EACH DAY What changed: See the new instructions.   bendamustine in sodium chloride 0.9 % 50 mL Inject into the vein once.   dronabinol 5 MG capsule Commonly known as: MARINOL TAKE ONE CAPSULE TWICE DAILY BEFORE MEALS   finasteride 5 MG tablet Commonly known as: PROSCAR TAKE ONE TABLET EACH DAY What changed: See the new instructions.   lidocaine-prilocaine cream Commonly known as: EMLA Apply 1 application topically as needed.   ondansetron 4 MG tablet Commonly known as: ZOFRAN Take 1 tablet (4 mg total) by mouth every 6 (six) hours as needed for nausea.   pantoprazole 40 MG tablet Commonly known as: Protonix Take 1 tablet (40 mg total) by mouth 2 (two) times daily.   penicillin v potassium 500 MG tablet Commonly known as: VEETID Take 1 tablet (500 mg total)  by mouth 4 (four) times daily.   polyvinyl alcohol 1.4 % ophthalmic solution Commonly known as: LIQUIFILM TEARS Place 1 drop into both eyes as needed for dry eyes.   predniSONE 20 MG tablet Commonly known as: Deltasone Take 1 tablet (20 mg total) by mouth daily with breakfast.   PROBIOTIC PO Take 1 capsule by mouth daily.   pyridOXINE 100 MG tablet Commonly known as: VITAMIN B-6 Take 100 mg by mouth daily.   QC MULTI-VITE 50 & OVER PO TAKE ONE TABLET DAILY   traMADol 50 MG tablet Commonly known as: ULTRAM Take 1 tablet (50 mg total) by mouth every 8 (eight) hours as needed for moderate pain.   Tums 500 MG chewable tablet Generic drug: calcium carbonate Chew 1  tablet by mouth as needed for indigestion or heartburn.   vitamin B-12 500 MCG tablet Commonly known as: CYANOCOBALAMIN Take 500 mcg by mouth daily.   Vitamin D (Ergocalciferol) 1.25 MG (50000 UNIT) Caps capsule Commonly known as: DRISDOL Take 50,000 Units by mouth every Sunday.       Allergies:  Allergies  Allergen Reactions  . Sulfa Antibiotics Itching  . Sulfasalazine Itching    Past Medical History, Surgical history, Social history, and Family History were reviewed and updated.  Review of Systems: Review of Systems  Constitutional: Negative.   HENT: Negative.   Eyes: Negative.   Respiratory: Negative.   Cardiovascular: Negative.   Gastrointestinal: Negative.   Genitourinary: Negative.   Musculoskeletal: Negative.   Skin: Negative.   Neurological: Negative.   Endo/Heme/Allergies: Negative.   Psychiatric/Behavioral: Negative.       Physical Exam:  weight is 141 lb 8 oz (64.2 kg). His temporal temperature is 97.5 F (36.4 C) (abnormal). His blood pressure is 183/85 (abnormal) and his pulse is 79. His respiration is 18 and oxygen saturation is 100%.   Wt Readings from Last 3 Encounters:  12/15/19 141 lb 8 oz (64.2 kg)  11/24/19 138 lb (62.6 kg)  11/18/19 137 lb 4 oz (62.3 kg)    Physical Exam Vitals reviewed.  HENT:     Head: Normocephalic and atraumatic.  Eyes:     Pupils: Pupils are equal, round, and reactive to light.  Cardiovascular:     Rate and Rhythm: Normal rate and regular rhythm.     Heart sounds: Normal heart sounds.  Pulmonary:     Effort: Pulmonary effort is normal.     Breath sounds: Normal breath sounds.  Abdominal:     General: Bowel sounds are normal.     Palpations: Abdomen is soft.  Musculoskeletal:        General: No tenderness or deformity. Normal range of motion.     Cervical back: Normal range of motion.  Lymphadenopathy:     Cervical: No cervical adenopathy.  Skin:    General: Skin is warm and dry.     Findings: No  erythema or rash.  Neurological:     Mental Status: He is alert and oriented to person, place, and time.  Psychiatric:        Behavior: Behavior normal.        Thought Content: Thought content normal.        Judgment: Judgment normal.       Lab Results  Component Value Date   WBC 5.8 12/15/2019   HGB 9.8 (L) 12/15/2019   HCT 28.5 (L) 12/15/2019   MCV 91.6 12/15/2019   PLT 94 (L) 12/15/2019   Lab Results  Component Value Date  FERRITIN 412 (H) 11/17/2019   IRON 104 11/17/2019   TIBC 224 11/17/2019   UIBC 119 11/17/2019   IRONPCTSAT 47 11/17/2019   Lab Results  Component Value Date   RETICCTPCT 1.3 12/10/2018   RBC 3.11 (L) 12/15/2019   Lab Results  Component Value Date   KPAFRELGTCHN 5.5 11/17/2019   LAMBDASER 55.9 (H) 11/17/2019   KAPLAMBRATIO 0.10 (L) 11/17/2019   Lab Results  Component Value Date   IGGSERUM 923 11/17/2019   IGA 26 (L) 11/17/2019   IGMSERUM 213 (H) 11/17/2019   Lab Results  Component Value Date   TOTALPROTELP 5.4 (L) 11/17/2019   ALBUMINELP 2.7 (L) 11/17/2019   A1GS 0.2 11/17/2019   A2GS 0.8 11/17/2019   BETS 0.8 11/17/2019   BETA2SER 0.3 07/28/2015   GAMS 0.9 11/17/2019   MSPIKE 0.4 (H) 11/17/2019   SPEI Comment 11/17/2019     Chemistry      Component Value Date/Time   NA 142 12/15/2019 1216   NA 146 (H) 08/07/2017 1153   NA 141 01/09/2017 1004   K 3.8 12/15/2019 1216   K 4.6 08/07/2017 1153   K 4.4 01/09/2017 1004   CL 109 12/15/2019 1216   CL 108 08/07/2017 1153   CO2 25 12/15/2019 1216   CO2 26 08/07/2017 1153   CO2 22 01/09/2017 1004   BUN 39 (H) 12/15/2019 1216   BUN 24 (H) 08/07/2017 1153   BUN 23.9 01/09/2017 1004   CREATININE 1.46 (H) 12/15/2019 1216   CREATININE 1.56 (H) 05/26/2018 1508   CREATININE 1.5 (H) 01/09/2017 1004      Component Value Date/Time   CALCIUM 8.7 (L) 12/15/2019 1216   CALCIUM 8.8 08/07/2017 1153   CALCIUM 8.8 01/09/2017 1004   ALKPHOS 44 12/15/2019 1216   ALKPHOS 97 (H) 08/07/2017 1153     ALKPHOS 80 01/09/2017 1004   AST 27 12/15/2019 1216   AST 18 01/09/2017 1004   ALT 14 12/15/2019 1216   ALT 20 08/07/2017 1153   ALT 12 01/09/2017 1004   BILITOT 0.7 12/15/2019 1216   BILITOT 0.92 01/09/2017 1004       Impression and Plan: Christian Burns is a very pleasant 86yo caucasian gentleman with relapsed IgG lambda myeloma.   Overall, I must say that he is doing a little bit better.  I am happy to see this.  It is all about quality of life with Christian Burns.  We will make sure he gets Aranesp today.  As far as the legs go, I told him to try some compression socks.  This may help a little bit.  We will plan to get him back in another month.  I am just happy that he has gained weight.  Overall his quality of life does seem to be better.   [name], MD 5/5/20211:33 PM

## 2019-12-15 NOTE — Patient Instructions (Signed)

## 2019-12-15 NOTE — Progress Notes (Signed)
Ok to treat with platelets 94 per Dr. Marin Olp.

## 2019-12-15 NOTE — Telephone Encounter (Signed)
Appointments scheduled calendar printed per 5/5 los 

## 2019-12-16 ENCOUNTER — Inpatient Hospital Stay: Payer: Medicare HMO

## 2019-12-16 VITALS — BP 154/79 | HR 72 | Temp 97.5°F | Resp 18

## 2019-12-16 DIAGNOSIS — Z5111 Encounter for antineoplastic chemotherapy: Secondary | ICD-10-CM | POA: Diagnosis not present

## 2019-12-16 DIAGNOSIS — C9000 Multiple myeloma not having achieved remission: Secondary | ICD-10-CM

## 2019-12-16 DIAGNOSIS — C9002 Multiple myeloma in relapse: Secondary | ICD-10-CM

## 2019-12-16 LAB — CUP PACEART REMOTE DEVICE CHECK
Date Time Interrogation Session: 20210501230620
Implantable Pulse Generator Implant Date: 20200909

## 2019-12-16 LAB — IGG, IGA, IGM
IgA: 12 mg/dL — ABNORMAL LOW (ref 61–437)
IgG (Immunoglobin G), Serum: 648 mg/dL (ref 603–1613)
IgM (Immunoglobulin M), Srm: 62 mg/dL (ref 15–143)

## 2019-12-16 LAB — IRON AND TIBC
Iron: 118 ug/dL (ref 42–163)
Saturation Ratios: 54 % (ref 20–55)
TIBC: 219 ug/dL (ref 202–409)
UIBC: 101 ug/dL — ABNORMAL LOW (ref 117–376)

## 2019-12-16 LAB — KAPPA/LAMBDA LIGHT CHAINS
Kappa free light chain: 1.6 mg/L — ABNORMAL LOW (ref 3.3–19.4)
Kappa, lambda light chain ratio: 0.05 — ABNORMAL LOW (ref 0.26–1.65)
Lambda free light chains: 34.8 mg/L — ABNORMAL HIGH (ref 5.7–26.3)

## 2019-12-16 LAB — PROTEIN ELECTROPHORESIS, SERUM
A/G Ratio: 1.2 (ref 0.7–1.7)
Albumin ELP: 2.8 g/dL — ABNORMAL LOW (ref 2.9–4.4)
Alpha-1-Globulin: 0.2 g/dL (ref 0.0–0.4)
Alpha-2-Globulin: 0.9 g/dL (ref 0.4–1.0)
Beta Globulin: 0.7 g/dL (ref 0.7–1.3)
Gamma Globulin: 0.6 g/dL (ref 0.4–1.8)
Globulin, Total: 2.4 g/dL (ref 2.2–3.9)
M-Spike, %: 0.3 g/dL — ABNORMAL HIGH
Total Protein ELP: 5.2 g/dL — ABNORMAL LOW (ref 6.0–8.5)

## 2019-12-16 LAB — FERRITIN: Ferritin: 472 ng/mL — ABNORMAL HIGH (ref 24–336)

## 2019-12-16 MED ORDER — SODIUM CHLORIDE 0.9% FLUSH
10.0000 mL | INTRAVENOUS | Status: DC | PRN
  Administered 2019-12-16: 10 mL
  Filled 2019-12-16: qty 10

## 2019-12-16 MED ORDER — SODIUM CHLORIDE 0.9 % IV SOLN
10.0000 mg | Freq: Once | INTRAVENOUS | Status: AC
  Administered 2019-12-16: 10 mg via INTRAVENOUS
  Filled 2019-12-16: qty 10

## 2019-12-16 MED ORDER — SODIUM CHLORIDE 0.9 % IV SOLN
75.0000 mg/m2 | Freq: Once | INTRAVENOUS | Status: AC
  Administered 2019-12-16: 125 mg via INTRAVENOUS
  Filled 2019-12-16: qty 5

## 2019-12-16 MED ORDER — HEPARIN SOD (PORK) LOCK FLUSH 100 UNIT/ML IV SOLN
500.0000 [IU] | Freq: Once | INTRAVENOUS | Status: AC | PRN
  Administered 2019-12-16: 500 [IU]
  Filled 2019-12-16: qty 5

## 2019-12-16 MED ORDER — SODIUM CHLORIDE 0.9 % IV SOLN
Freq: Once | INTRAVENOUS | Status: AC
  Filled 2019-12-16: qty 250

## 2019-12-16 NOTE — Patient Instructions (Signed)
Bendamustine Injection What is this medicine? BENDAMUSTINE (BEN da MUS teen) is a chemotherapy drug. It is used to treat chronic lymphocytic leukemia and non-Hodgkin lymphoma. This medicine may be used for other purposes; ask your health care provider or pharmacist if you have questions. COMMON BRAND NAME(S): BELRAPZO, BENDEKA, Treanda What should I tell my health care provider before I take this medicine? They need to know if you have any of these conditions:  infection (especially a virus infection such as chickenpox, cold sores, or herpes)  kidney disease  liver disease  an unusual or allergic reaction to bendamustine, mannitol, other medicines, foods, dyes, or preservatives  pregnant or trying to get pregnant  breast-feeding How should I use this medicine? This medicine is for infusion into a vein. It is given by a health care professional in a hospital or clinic setting. Talk to your pediatrician regarding the use of this medicine in children. Special care may be needed. Overdosage: If you think you have taken too much of this medicine contact a poison control center or emergency room at once. NOTE: This medicine is only for you. Do not share this medicine with others. What if I miss a dose? It is important not to miss your dose. Call your doctor or health care professional if you are unable to keep an appointment. What may interact with this medicine? Do not take this medicine with any of the following medications:  clozapine This medicine may also interact with the following medications:  atazanavir  cimetidine  ciprofloxacin  enoxacin  fluvoxamine  medicines for seizures like carbamazepine and phenobarbital  mexiletine  rifampin  tacrine  thiabendazole  zileuton This list may not describe all possible interactions. Give your health care provider a list of all the medicines, herbs, non-prescription drugs, or dietary supplements you use. Also tell them if  you smoke, drink alcohol, or use illegal drugs. Some items may interact with your medicine. What should I watch for while using this medicine? This drug may make you feel generally unwell. This is not uncommon, as chemotherapy can affect healthy cells as well as cancer cells. Report any side effects. Continue your course of treatment even though you feel ill unless your doctor tells you to stop. You may need blood work done while you are taking this medicine. Call your doctor or healthcare provider for advice if you get a fever, chills or sore throat, or other symptoms of a cold or flu. Do not treat yourself. This drug decreases your body's ability to fight infections. Try to avoid being around people who are sick. This medicine may cause serious skin reactions. They can happen weeks to months after starting the medicine. Contact your healthcare provider right away if you notice fevers or flu-like symptoms with a rash. The rash may be red or purple and then turn into blisters or peeling of the skin. Or, you might notice a red rash with swelling of the face, lips or lymph nodes in your neck or under your arms. This medicine may increase your risk to bruise or bleed. Call your doctor or healthcare provider if you notice any unusual bleeding. Talk to your doctor about your risk of cancer. You may be more at risk for certain types of cancers if you take this medicine. Do not become pregnant while taking this medicine or for at least 6 months after stopping it. Women should inform their doctor if they wish to become pregnant or think they might be pregnant. Men should not   father a child while taking this medicine and for at least 3 months after stopping it. There is a potential for serious side effects to an unborn child. Talk to your healthcare provider or pharmacist for more information. Do not breast-feed an infant while taking this medicine or for at least 1 week after stopping it. This medicine may make it  more difficult to father a child. You should talk with your doctor or healthcare provider if you are concerned about your fertility. What side effects may I notice from receiving this medicine? Side effects that you should report to your doctor or health care professional as soon as possible:  allergic reactions like skin rash, itching or hives, swelling of the face, lips, or tongue  low blood counts - this medicine may decrease the number of white blood cells, red blood cells and platelets. You may be at increased risk for infections and bleeding.  rash, fever, and swollen lymph nodes  redness, blistering, peeling, or loosening of the skin, including inside the mouth  signs of infection like fever or chills, cough, sore throat, pain or difficulty passing urine  signs of decreased platelets or bleeding like bruising, pinpoint red spots on the skin, black, tarry stools, blood in the urine  signs of decreased red blood cells like being unusually weak or tired, fainting spells, lightheadedness  signs and symptoms of kidney injury like trouble passing urine or change in the amount of urine  signs and symptoms of liver injury like dark yellow or brown urine; general ill feeling or flu-like symptoms; light-colored stools; loss of appetite; nausea; right upper belly pain; unusually weak or tired; yellowing of the eyes or skin Side effects that usually do not require medical attention (report to your doctor or health care professional if they continue or are bothersome):  constipation  decreased appetite  diarrhea  headache  mouth sores  nausea, vomiting  tiredness This list may not describe all possible side effects. Call your doctor for medical advice about side effects. You may report side effects to FDA at 1-800-FDA-1088. Where should I keep my medicine? This drug is given in a hospital or clinic and will not be stored at home. NOTE: This sheet is a summary. It may not cover all  possible information. If you have questions about this medicine, talk to your doctor, pharmacist, or health care provider.  2020 [employer]vier/Gold Standard (2018-10-20 10:26:46)  

## 2019-12-20 ENCOUNTER — Ambulatory Visit (INDEPENDENT_AMBULATORY_CARE_PROVIDER_SITE_OTHER): Payer: Medicare HMO | Admitting: *Deleted

## 2019-12-20 DIAGNOSIS — I63412 Cerebral infarction due to embolism of left middle cerebral artery: Secondary | ICD-10-CM | POA: Diagnosis not present

## 2019-12-20 NOTE — Progress Notes (Signed)
Carelink Summary Report / Loop Recorder 

## 2020-01-04 NOTE — Progress Notes (Signed)
Pharmacist Chemotherapy Monitoring - Follow Up Assessment    I verify that I have reviewed each item in the below checklist:  . Regimen for the patient is scheduled for the appropriate day and plan matches scheduled date. Marland Kitchen Appropriate non-routine labs are ordered dependent on drug ordered. . If applicable, additional medications reviewed and ordered per protocol based on lifetime cumulative doses and/or treatment regimen.   Plan for follow-up and/or issues identified: Yes . I-vent associated with next due treatment: Yes . MD and/or nursing notified: Yes  Janeliz Prestwood, Jacqlyn Larsen 01/04/2020 2:38 PM

## 2020-01-12 ENCOUNTER — Inpatient Hospital Stay: Payer: Medicare HMO | Attending: Hematology & Oncology

## 2020-01-12 ENCOUNTER — Inpatient Hospital Stay: Payer: Medicare HMO

## 2020-01-12 ENCOUNTER — Encounter: Payer: Self-pay | Admitting: Family

## 2020-01-12 ENCOUNTER — Inpatient Hospital Stay (HOSPITAL_BASED_OUTPATIENT_CLINIC_OR_DEPARTMENT_OTHER): Payer: Medicare HMO | Admitting: Family

## 2020-01-12 ENCOUNTER — Other Ambulatory Visit: Payer: Self-pay

## 2020-01-12 ENCOUNTER — Ambulatory Visit (INDEPENDENT_AMBULATORY_CARE_PROVIDER_SITE_OTHER): Payer: Medicare HMO | Admitting: *Deleted

## 2020-01-12 VITALS — BP 176/79 | HR 76 | Temp 97.3°F | Resp 20 | Wt 138.0 lb

## 2020-01-12 DIAGNOSIS — Z7952 Long term (current) use of systemic steroids: Secondary | ICD-10-CM | POA: Insufficient documentation

## 2020-01-12 DIAGNOSIS — C9002 Multiple myeloma in relapse: Secondary | ICD-10-CM | POA: Diagnosis not present

## 2020-01-12 DIAGNOSIS — D508 Other iron deficiency anemias: Secondary | ICD-10-CM | POA: Diagnosis not present

## 2020-01-12 DIAGNOSIS — G629 Polyneuropathy, unspecified: Secondary | ICD-10-CM | POA: Diagnosis not present

## 2020-01-12 DIAGNOSIS — R197 Diarrhea, unspecified: Secondary | ICD-10-CM | POA: Insufficient documentation

## 2020-01-12 DIAGNOSIS — Z79899 Other long term (current) drug therapy: Secondary | ICD-10-CM | POA: Diagnosis not present

## 2020-01-12 DIAGNOSIS — D649 Anemia, unspecified: Secondary | ICD-10-CM

## 2020-01-12 DIAGNOSIS — R609 Edema, unspecified: Secondary | ICD-10-CM | POA: Diagnosis not present

## 2020-01-12 DIAGNOSIS — R5383 Other fatigue: Secondary | ICD-10-CM | POA: Insufficient documentation

## 2020-01-12 DIAGNOSIS — T451X5A Adverse effect of antineoplastic and immunosuppressive drugs, initial encounter: Secondary | ICD-10-CM | POA: Diagnosis not present

## 2020-01-12 DIAGNOSIS — R350 Frequency of micturition: Secondary | ICD-10-CM | POA: Diagnosis not present

## 2020-01-12 DIAGNOSIS — Z5111 Encounter for antineoplastic chemotherapy: Secondary | ICD-10-CM | POA: Diagnosis present

## 2020-01-12 DIAGNOSIS — I63412 Cerebral infarction due to embolism of left middle cerebral artery: Secondary | ICD-10-CM | POA: Diagnosis not present

## 2020-01-12 DIAGNOSIS — D6481 Anemia due to antineoplastic chemotherapy: Secondary | ICD-10-CM | POA: Insufficient documentation

## 2020-01-12 DIAGNOSIS — N289 Disorder of kidney and ureter, unspecified: Secondary | ICD-10-CM | POA: Diagnosis not present

## 2020-01-12 DIAGNOSIS — Z882 Allergy status to sulfonamides status: Secondary | ICD-10-CM | POA: Diagnosis not present

## 2020-01-12 DIAGNOSIS — C9000 Multiple myeloma not having achieved remission: Secondary | ICD-10-CM

## 2020-01-12 LAB — CMP (CANCER CENTER ONLY)
ALT: 15 U/L (ref 0–44)
AST: 27 U/L (ref 15–41)
Albumin: 3.2 g/dL — ABNORMAL LOW (ref 3.5–5.0)
Alkaline Phosphatase: 42 U/L (ref 38–126)
Anion gap: 7 (ref 5–15)
BUN: 42 mg/dL — ABNORMAL HIGH (ref 8–23)
CO2: 24 mmol/L (ref 22–32)
Calcium: 8.4 mg/dL — ABNORMAL LOW (ref 8.9–10.3)
Chloride: 110 mmol/L (ref 98–111)
Creatinine: 1.76 mg/dL — ABNORMAL HIGH (ref 0.61–1.24)
GFR, Est AFR Am: 39 mL/min — ABNORMAL LOW (ref >60)
GFR, Estimated: 34 mL/min — ABNORMAL LOW (ref >60)
Glucose, Bld: 126 mg/dL — ABNORMAL HIGH (ref 70–99)
Potassium: 3.8 mmol/L (ref 3.5–5.1)
Sodium: 141 mmol/L (ref 135–145)
Total Bilirubin: 0.6 mg/dL (ref 0.3–1.2)
Total Protein: 5.1 g/dL — ABNORMAL LOW (ref 6.5–8.1)

## 2020-01-12 LAB — CBC WITH DIFFERENTIAL (CANCER CENTER ONLY)
Abs Immature Granulocytes: 0.58 10*3/uL — ABNORMAL HIGH (ref 0.00–0.07)
Basophils Absolute: 0 10*3/uL (ref 0.0–0.1)
Basophils Relative: 1 %
Eosinophils Absolute: 0.1 10*3/uL (ref 0.0–0.5)
Eosinophils Relative: 1 %
HCT: 27.7 % — ABNORMAL LOW (ref 39.0–52.0)
Hemoglobin: 9.3 g/dL — ABNORMAL LOW (ref 13.0–17.0)
Immature Granulocytes: 11 %
Lymphocytes Relative: 2 %
Lymphs Abs: 0.1 10*3/uL — ABNORMAL LOW (ref 0.7–4.0)
MCH: 32.2 pg (ref 26.0–34.0)
MCHC: 33.6 g/dL (ref 30.0–36.0)
MCV: 95.8 fL (ref 80.0–100.0)
Monocytes Absolute: 1 10*3/uL (ref 0.1–1.0)
Monocytes Relative: 18 %
Neutro Abs: 3.7 10*3/uL (ref 1.7–7.7)
Neutrophils Relative %: 67 %
Platelet Count: 115 10*3/uL — ABNORMAL LOW (ref 150–400)
RBC: 2.89 MIL/uL — ABNORMAL LOW (ref 4.22–5.81)
RDW: 16.1 % — ABNORMAL HIGH (ref 11.5–15.5)
WBC Count: 5.4 10*3/uL (ref 4.0–10.5)
nRBC: 0 % (ref 0.0–0.2)

## 2020-01-12 LAB — CUP PACEART REMOTE DEVICE CHECK
Date Time Interrogation Session: 20210601230149
Implantable Pulse Generator Implant Date: 20200909

## 2020-01-12 LAB — SAMPLE TO BLOOD BANK

## 2020-01-12 MED ORDER — SODIUM CHLORIDE 0.9 % IV SOLN
Freq: Once | INTRAVENOUS | Status: AC
  Filled 2020-01-12: qty 250

## 2020-01-12 MED ORDER — SODIUM CHLORIDE 0.9 % IV SOLN
10.0000 mg | Freq: Once | INTRAVENOUS | Status: AC
  Administered 2020-01-12: 10 mg via INTRAVENOUS
  Filled 2020-01-12: qty 10

## 2020-01-12 MED ORDER — EPOETIN ALFA-EPBX 10000 UNIT/ML IJ SOLN
INTRAMUSCULAR | Status: AC
  Filled 2020-01-12: qty 2

## 2020-01-12 MED ORDER — PALONOSETRON HCL INJECTION 0.25 MG/5ML
0.2500 mg | Freq: Once | INTRAVENOUS | Status: AC
  Administered 2020-01-12: 0.25 mg via INTRAVENOUS

## 2020-01-12 MED ORDER — SODIUM CHLORIDE 0.9 % IV SOLN
75.0000 mg/m2 | Freq: Once | INTRAVENOUS | Status: AC
  Administered 2020-01-12: 125 mg via INTRAVENOUS
  Filled 2020-01-12: qty 5

## 2020-01-12 MED ORDER — EPOETIN ALFA-EPBX 10000 UNIT/ML IJ SOLN
20000.0000 [IU] | Freq: Once | INTRAMUSCULAR | Status: AC
  Administered 2020-01-12: 20000 [IU] via SUBCUTANEOUS

## 2020-01-12 MED ORDER — PALONOSETRON HCL INJECTION 0.25 MG/5ML
INTRAVENOUS | Status: AC
  Filled 2020-01-12: qty 5

## 2020-01-12 MED ORDER — SODIUM CHLORIDE 0.9% FLUSH
10.0000 mL | INTRAVENOUS | Status: DC | PRN
  Administered 2020-01-12: 10 mL
  Filled 2020-01-12: qty 10

## 2020-01-12 MED ORDER — HEPARIN SOD (PORK) LOCK FLUSH 100 UNIT/ML IV SOLN
500.0000 [IU] | Freq: Once | INTRAVENOUS | Status: AC | PRN
  Administered 2020-01-12: 500 [IU]
  Filled 2020-01-12: qty 5

## 2020-01-12 NOTE — Patient Instructions (Signed)

## 2020-01-12 NOTE — Patient Instructions (Signed)
[facility] Discharge Instructions for Patients Receiving Chemotherapy  Today you received the following chemotherapy agents Bendeka.   To help prevent nausea and vomiting after your treatment, we encourage you to take your nausea medication as directed.    If you develop nausea and vomiting that is not controlled by your nausea medication, call the clinic.   BELOW ARE SYMPTOMS THAT SHOULD BE REPORTED IMMEDIATELY:  *FEVER GREATER THAN 100.5 F  *CHILLS WITH OR WITHOUT FEVER  NAUSEA AND VOMITING THAT IS NOT CONTROLLED WITH YOUR NAUSEA MEDICATION  *UNUSUAL SHORTNESS OF BREATH  *UNUSUAL BRUISING OR BLEEDING  TENDERNESS IN MOUTH AND THROAT WITH OR WITHOUT PRESENCE OF ULCERS  *URINARY PROBLEMS  *BOWEL PROBLEMS  UNUSUAL RASH Items with * indicate a potential emergency and should be followed up as soon as possible.  Feel free to call the clinic you have any questions or concerns. The clinic phone number is [phone].  Please show the CHEMO ALERT CARD at check-in to the Emergency Department and triage nurse.   

## 2020-01-12 NOTE — Progress Notes (Signed)
Hematology and Oncology Follow Up Visit  Christian Burns:[id number] 1929/11/27 86 y.o. 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 Palliative radiation therapy to right ilium Kyprolis/Cytoxanq 3 week dosing- s/p cycle 24 (Cytoxan restarted on cycle 13) - DC'd due to progression Radiation therapy to right femur, 10 fraction --completed 3000 rad on 11/06/2018 Elotuzomab - started 10/29/2018 -- s/p cycle #1 -- d/c on 12/24/2018 Pomalidomide 2 mg po q day (21 on/7 off) - started 10/29/2018 -- d/c 12/31/2018 Daratumumab - start cycle # 1 on 12/31/2018 -- d/c on -02/04/2019  Current Therapy: Bendamustine/Decadron -- started on 02/10/2019, s/p cycle10 Zometa IV q 4 weeks - on hold Aranesp 300 g subcutaneous as needed for hemoglobin less than 10 IVIG 40g IV q month -- start on 07/2019   Interim History:  Christian Burns is here today for follow-up and treatment. He is looking so much better and states that he feels good.  He has some occasional fatigue but is getting around nicely without assistance.  May M-spike was 0.3, IgG level 648 and lambda light chains 34.8 mg/L.  No fever, chills, n/v, cough, rash, dizziness, SOB, chest pain, palpitations, abdominal pain or changes in bowel or bladder habits.  He has urinary frequency which he plans to address with his urologist in July.  He will have occasional episodes of diarrhea and has started taking Imodium which seems to help.  No episodes of bleeding. No bruising or petechiae.  No swelling or tenderness in his extremities at this time. He wears his zip up compression stockings regularly to reduce any fluid retention.   The numbness (neuropathy) in his hands seems  to be a little more present but feet are unchanged. No falls or syncopal episodes to report.  He has maintained a good appetite and he his swallowing seems to have improved. He is staying well hydrated. His weight is stable.   ECOG Performance Status: 1 - Symptomatic but completely ambulatory  Medications:  Allergies as of 01/12/2020      Reactions   Sulfa Antibiotics Itching   Sulfasalazine Itching      Medication List       Accurate as of January 12, 2020 12:10 PM. If you have any questions, ask your nurse or doctor.        STOP taking these medications   Tums 500 MG chewable tablet Generic drug: calcium carbonate Stopped by: Laverna Peace, NP     TAKE these medications   ALOXI IV Inject into the vein.   amLODipine-benazepril 5-10 MG capsule Commonly known as: LOTREL TAKE ONE CAPSULE EACH DAY What changed: See the new instructions.   bendamustine in sodium chloride 0.9 % 50 mL Inject into the vein once.   dronabinol 5 MG capsule Commonly known as: MARINOL TAKE ONE CAPSULE TWICE DAILY BEFORE MEALS   finasteride 5 MG tablet Commonly known as: PROSCAR TAKE ONE TABLET EACH DAY What changed: See the new instructions.   lidocaine-prilocaine cream Commonly known as: EMLA Apply 1 application topically as needed.   ondansetron 4 MG tablet Commonly known as: ZOFRAN Take 1 tablet (4 mg total) by mouth every 6 (six) hours as needed for nausea.   pantoprazole 40 MG tablet Commonly known as: Protonix Take 1 tablet (40  mg total) by mouth 2 (two) times daily.   penicillin v potassium 500 MG tablet Commonly known as: VEETID Take 1 tablet (500 mg total) by mouth 4 (four) times daily.   polyvinyl alcohol 1.4 % ophthalmic solution Commonly known as: LIQUIFILM TEARS Place 1 drop into both eyes as needed for dry eyes.   predniSONE 20 MG tablet Commonly known as: Deltasone Take 1 tablet (20 mg total) by mouth daily with breakfast.   PROBIOTIC PO Take 1 capsule by mouth  daily.   pyridOXINE 100 MG tablet Commonly known as: VITAMIN B-6 Take 100 mg by mouth daily.   QC MULTI-VITE 50 & OVER PO TAKE ONE TABLET DAILY   traMADol 50 MG tablet Commonly known as: ULTRAM Take 1 tablet (50 mg total) by mouth every 8 (eight) hours as needed for moderate pain.   vitamin B-12 500 MCG tablet Commonly known as: CYANOCOBALAMIN Take 500 mcg by mouth daily.   Vitamin D (Ergocalciferol) 1.25 MG (50000 UNIT) Caps capsule Commonly known as: DRISDOL Take 50,000 Units by mouth every Sunday.       Allergies:  Allergies  Allergen Reactions  . Sulfa Antibiotics Itching  . Sulfasalazine Itching    Past Medical History, Surgical history, Social history, and Family History were reviewed and updated.  Review of Systems: All other 10 point review of systems is negative.   Physical Exam:  weight is 138 lb (62.6 kg). His temporal temperature is 97.3 F (36.3 C) (abnormal). His blood pressure is 176/79 (abnormal) and his pulse is 76. His respiration is 20 and oxygen saturation is 100%.   Wt Readings from Last 3 Encounters:  01/12/20 138 lb (62.6 kg)  12/15/19 141 lb 8 oz (64.2 kg)  11/24/19 138 lb (62.6 kg)    Ocular: Sclerae unicteric, pupils equal, round and reactive to light Ear-nose-throat: Oropharynx clear, dentition fair Lymphatic: No cervical or supraclavicular adenopathy Lungs no rales or rhonchi, good excursion bilaterally Heart regular rate and rhythm, no murmur appreciated Abd soft, nontender, positive bowel sounds, no liver or spleen tip palpated on exam, no fluid wave  MSK no focal spinal tenderness, no joint edema Neuro: non-focal, well-oriented, appropriate affect Breasts: Deferred   Lab Results  Component Value Date   WBC 5.4 01/12/2020   HGB 9.3 (L) 01/12/2020   HCT 27.7 (L) 01/12/2020   MCV 95.8 01/12/2020   PLT 115 (L) 01/12/2020   Lab Results  Component Value Date   FERRITIN 472 (H) 12/15/2019   IRON 118 12/15/2019   TIBC 219  12/15/2019   UIBC 101 (L) 12/15/2019   IRONPCTSAT 54 12/15/2019   Lab Results  Component Value Date   RETICCTPCT 1.3 12/10/2018   RBC 2.89 (L) 01/12/2020   Lab Results  Component Value Date   KPAFRELGTCHN 1.6 (L) 12/15/2019   LAMBDASER 34.8 (H) 12/15/2019   KAPLAMBRATIO 0.05 (L) 12/15/2019   Lab Results  Component Value Date   IGGSERUM 648 12/15/2019   IGA 12 (L) 12/15/2019   IGMSERUM 62 12/15/2019   Lab Results  Component Value Date   TOTALPROTELP 5.2 (L) 12/15/2019   ALBUMINELP 2.8 (L) 12/15/2019   A1GS 0.2 12/15/2019   A2GS 0.9 12/15/2019   BETS 0.7 12/15/2019   BETA2SER 0.3 07/28/2015   GAMS 0.6 12/15/2019   MSPIKE 0.3 (H) 12/15/2019   SPEI Comment 12/15/2019     Chemistry      Component Value Date/Time   NA 142 12/15/2019 1216   NA 146 (H) 08/07/2017 1153  NA 141 01/09/2017 1004   K 3.8 12/15/2019 1216   K 4.6 08/07/2017 1153   K 4.4 01/09/2017 1004   CL 109 12/15/2019 1216   CL 108 08/07/2017 1153   CO2 25 12/15/2019 1216   CO2 26 08/07/2017 1153   CO2 22 01/09/2017 1004   BUN 39 (H) 12/15/2019 1216   BUN 24 (H) 08/07/2017 1153   BUN 23.9 01/09/2017 1004   CREATININE 1.46 (H) 12/15/2019 1216   CREATININE 1.56 (H) 05/26/2018 1508   CREATININE 1.5 (H) 01/09/2017 1004      Component Value Date/Time   CALCIUM 8.7 (L) 12/15/2019 1216   CALCIUM 8.8 08/07/2017 1153   CALCIUM 8.8 01/09/2017 1004   ALKPHOS 44 12/15/2019 1216   ALKPHOS 97 (H) 08/07/2017 1153   ALKPHOS 80 01/09/2017 1004   AST 27 12/15/2019 1216   AST 18 01/09/2017 1004   ALT 14 12/15/2019 1216   ALT 20 08/07/2017 1153   ALT 12 01/09/2017 1004   BILITOT 0.7 12/15/2019 1216   BILITOT 0.92 01/09/2017 1004       Impression and Plan: Christian Burns is a very pleasant 86yo caucasian gentleman with relapsed IgG lambda myeloma. He continues to do well right now on Bendamustine and Decadron. We will proceed with treatment today as planned.  He also received Aranesp for Hgb 9.3.  We will plan  to see him again in a month.  He will contact our office with any questions or concerns. We can certainly see him sooner if needed.   Laverna Peace, NP 6/2/202112:10 PM

## 2020-01-12 NOTE — Progress Notes (Signed)
OK to treat today with creat-1.76 per Dr. Marin Olp.

## 2020-01-13 ENCOUNTER — Inpatient Hospital Stay: Payer: Medicare HMO

## 2020-01-13 VITALS — BP 158/87 | HR 71 | Temp 97.5°F | Resp 20

## 2020-01-13 DIAGNOSIS — C9002 Multiple myeloma in relapse: Secondary | ICD-10-CM

## 2020-01-13 DIAGNOSIS — Z5111 Encounter for antineoplastic chemotherapy: Secondary | ICD-10-CM | POA: Diagnosis not present

## 2020-01-13 DIAGNOSIS — C9000 Multiple myeloma not having achieved remission: Secondary | ICD-10-CM

## 2020-01-13 LAB — KAPPA/LAMBDA LIGHT CHAINS
Kappa free light chain: 1.4 mg/L — ABNORMAL LOW (ref 3.3–19.4)
Kappa, lambda light chain ratio: 0.05 — ABNORMAL LOW (ref 0.26–1.65)
Lambda free light chains: 27.6 mg/L — ABNORMAL HIGH (ref 5.7–26.3)

## 2020-01-13 LAB — IRON AND TIBC
Iron: 120 ug/dL (ref 42–163)
Saturation Ratios: 51 % (ref 20–55)
TIBC: 235 ug/dL (ref 202–409)
UIBC: 115 ug/dL — ABNORMAL LOW (ref 117–376)

## 2020-01-13 LAB — FERRITIN: Ferritin: 386 ng/mL — ABNORMAL HIGH (ref 24–336)

## 2020-01-13 LAB — IGG, IGA, IGM
IgA: 6 mg/dL — ABNORMAL LOW (ref 61–437)
IgG (Immunoglobin G), Serum: 398 mg/dL — ABNORMAL LOW (ref 603–1613)
IgM (Immunoglobulin M), Srm: 14 mg/dL — ABNORMAL LOW (ref 15–143)

## 2020-01-13 MED ORDER — SODIUM CHLORIDE 0.9 % IV SOLN
75.0000 mg/m2 | Freq: Once | INTRAVENOUS | Status: AC
  Administered 2020-01-13: 125 mg via INTRAVENOUS
  Filled 2020-01-13: qty 5

## 2020-01-13 MED ORDER — SODIUM CHLORIDE 0.9 % IV SOLN
10.0000 mg | Freq: Once | INTRAVENOUS | Status: AC
  Administered 2020-01-13: 10 mg via INTRAVENOUS
  Filled 2020-01-13: qty 10

## 2020-01-13 MED ORDER — SODIUM CHLORIDE 0.9% FLUSH
10.0000 mL | INTRAVENOUS | Status: DC | PRN
  Administered 2020-01-13: 10 mL
  Filled 2020-01-13: qty 10

## 2020-01-13 MED ORDER — HEPARIN SOD (PORK) LOCK FLUSH 100 UNIT/ML IV SOLN
500.0000 [IU] | Freq: Once | INTRAVENOUS | Status: AC | PRN
  Administered 2020-01-13: 500 [IU]
  Filled 2020-01-13: qty 5

## 2020-01-13 MED ORDER — SODIUM CHLORIDE 0.9 % IV SOLN
Freq: Once | INTRAVENOUS | Status: AC
  Filled 2020-01-13: qty 250

## 2020-01-13 NOTE — Patient Instructions (Signed)
[facility] Discharge Instructions for Patients Receiving Chemotherapy  Today you received the following chemotherapy agents:  Bendeka  To help prevent nausea and vomiting after your treatment, we encourage you to take your nausea medication as ordered per MD.    If you develop nausea and vomiting that is not controlled by your nausea medication, call the clinic.   BELOW ARE SYMPTOMS THAT SHOULD BE REPORTED IMMEDIATELY:  *FEVER GREATER THAN 100.5 F  *CHILLS WITH OR WITHOUT FEVER  NAUSEA AND VOMITING THAT IS NOT CONTROLLED WITH YOUR NAUSEA MEDICATION  *UNUSUAL SHORTNESS OF BREATH  *UNUSUAL BRUISING OR BLEEDING  TENDERNESS IN MOUTH AND THROAT WITH OR WITHOUT PRESENCE OF ULCERS  *URINARY PROBLEMS  *BOWEL PROBLEMS  UNUSUAL RASH Items with * indicate a potential emergency and should be followed up as soon as possible.  Feel free to call the clinic should you have any questions or concerns. The clinic phone number is [phone].  Please show the CHEMO ALERT CARD at check-in to the Emergency Department and triage nurse.   

## 2020-01-14 LAB — PROTEIN ELECTROPHORESIS, SERUM, WITH REFLEX
A/G Ratio: 1.3 (ref 0.7–1.7)
Albumin ELP: 2.8 g/dL — ABNORMAL LOW (ref 2.9–4.4)
Alpha-1-Globulin: 0.2 g/dL (ref 0.0–0.4)
Alpha-2-Globulin: 0.8 g/dL (ref 0.4–1.0)
Beta Globulin: 0.7 g/dL (ref 0.7–1.3)
Gamma Globulin: 0.4 g/dL (ref 0.4–1.8)
Globulin, Total: 2.1 g/dL — ABNORMAL LOW (ref 2.2–3.9)
M-Spike, %: 0.2 g/dL — ABNORMAL HIGH
SPEP Interpretation: 0
Total Protein ELP: 4.9 g/dL — ABNORMAL LOW (ref 6.0–8.5)

## 2020-01-14 LAB — IMMUNOFIXATION REFLEX, SERUM
IgA: 6 mg/dL — ABNORMAL LOW (ref 61–437)
IgG (Immunoglobin G), Serum: 403 mg/dL — ABNORMAL LOW (ref 603–1613)
IgM (Immunoglobulin M), Srm: 14 mg/dL — ABNORMAL LOW (ref 15–143)

## 2020-01-17 ENCOUNTER — Ambulatory Visit: Payer: Medicare HMO | Admitting: Gastroenterology

## 2020-01-18 ENCOUNTER — Other Ambulatory Visit: Payer: Self-pay | Admitting: Hematology & Oncology

## 2020-01-18 DIAGNOSIS — I10 Essential (primary) hypertension: Secondary | ICD-10-CM

## 2020-01-19 NOTE — Progress Notes (Signed)
Carelink Summary Report / Loop Recorder 

## 2020-01-21 ENCOUNTER — Other Ambulatory Visit: Payer: Self-pay | Admitting: Hematology & Oncology

## 2020-01-21 DIAGNOSIS — R112 Nausea with vomiting, unspecified: Secondary | ICD-10-CM

## 2020-01-21 DIAGNOSIS — C9002 Multiple myeloma in relapse: Secondary | ICD-10-CM

## 2020-01-21 DIAGNOSIS — R634 Abnormal weight loss: Secondary | ICD-10-CM

## 2020-02-02 ENCOUNTER — Other Ambulatory Visit: Payer: Self-pay | Admitting: Hematology & Oncology

## 2020-02-09 ENCOUNTER — Inpatient Hospital Stay (HOSPITAL_BASED_OUTPATIENT_CLINIC_OR_DEPARTMENT_OTHER): Payer: Medicare HMO | Admitting: Family

## 2020-02-09 ENCOUNTER — Inpatient Hospital Stay: Payer: Medicare HMO

## 2020-02-09 ENCOUNTER — Other Ambulatory Visit: Payer: Self-pay

## 2020-02-09 ENCOUNTER — Encounter: Payer: Self-pay | Admitting: Family

## 2020-02-09 VITALS — BP 162/87 | HR 82 | Temp 97.7°F | Resp 18 | Ht 70.0 in | Wt 143.9 lb

## 2020-02-09 VITALS — BP 173/72

## 2020-02-09 DIAGNOSIS — Z5111 Encounter for antineoplastic chemotherapy: Secondary | ICD-10-CM | POA: Diagnosis not present

## 2020-02-09 DIAGNOSIS — D508 Other iron deficiency anemias: Secondary | ICD-10-CM

## 2020-02-09 DIAGNOSIS — C9002 Multiple myeloma in relapse: Secondary | ICD-10-CM | POA: Diagnosis not present

## 2020-02-09 DIAGNOSIS — C9000 Multiple myeloma not having achieved remission: Secondary | ICD-10-CM

## 2020-02-09 DIAGNOSIS — D649 Anemia, unspecified: Secondary | ICD-10-CM

## 2020-02-09 DIAGNOSIS — Z95828 Presence of other vascular implants and grafts: Secondary | ICD-10-CM

## 2020-02-09 LAB — CBC WITH DIFFERENTIAL (CANCER CENTER ONLY)
Abs Immature Granulocytes: 0.45 10*3/uL — ABNORMAL HIGH (ref 0.00–0.07)
Basophils Absolute: 0 10*3/uL (ref 0.0–0.1)
Basophils Relative: 0 %
Eosinophils Absolute: 0.1 10*3/uL (ref 0.0–0.5)
Eosinophils Relative: 2 %
HCT: 27.5 % — ABNORMAL LOW (ref 39.0–52.0)
Hemoglobin: 9.3 g/dL — ABNORMAL LOW (ref 13.0–17.0)
Immature Granulocytes: 10 %
Lymphocytes Relative: 1 %
Lymphs Abs: 0.1 10*3/uL — ABNORMAL LOW (ref 0.7–4.0)
MCH: 33 pg (ref 26.0–34.0)
MCHC: 33.8 g/dL (ref 30.0–36.0)
MCV: 97.5 fL (ref 80.0–100.0)
Monocytes Absolute: 0.7 10*3/uL (ref 0.1–1.0)
Monocytes Relative: 16 %
Neutro Abs: 3.3 10*3/uL (ref 1.7–7.7)
Neutrophils Relative %: 71 %
Platelet Count: 112 10*3/uL — ABNORMAL LOW (ref 150–400)
RBC: 2.82 MIL/uL — ABNORMAL LOW (ref 4.22–5.81)
RDW: 15.3 % (ref 11.5–15.5)
WBC Count: 4.6 10*3/uL (ref 4.0–10.5)
nRBC: 0 % (ref 0.0–0.2)

## 2020-02-09 LAB — CMP (CANCER CENTER ONLY)
ALT: 16 U/L (ref 0–44)
AST: 27 U/L (ref 15–41)
Albumin: 3.1 g/dL — ABNORMAL LOW (ref 3.5–5.0)
Alkaline Phosphatase: 50 U/L (ref 38–126)
Anion gap: 6 (ref 5–15)
BUN: 41 mg/dL — ABNORMAL HIGH (ref 8–23)
CO2: 27 mmol/L (ref 22–32)
Calcium: 8.5 mg/dL — ABNORMAL LOW (ref 8.9–10.3)
Chloride: 108 mmol/L (ref 98–111)
Creatinine: 1.57 mg/dL — ABNORMAL HIGH (ref 0.61–1.24)
GFR, Est AFR Am: 45 mL/min — ABNORMAL LOW (ref >60)
GFR, Estimated: 39 mL/min — ABNORMAL LOW (ref >60)
Glucose, Bld: 122 mg/dL — ABNORMAL HIGH (ref 70–99)
Potassium: 3.8 mmol/L (ref 3.5–5.1)
Sodium: 141 mmol/L (ref 135–145)
Total Bilirubin: 0.6 mg/dL (ref 0.3–1.2)
Total Protein: 5 g/dL — ABNORMAL LOW (ref 6.5–8.1)

## 2020-02-09 LAB — RETICULOCYTES
Immature Retic Fract: 13.7 % (ref 2.3–15.9)
RBC.: 2.84 MIL/uL — ABNORMAL LOW (ref 4.22–5.81)
Retic Count, Absolute: 36.1 10*3/uL (ref 19.0–186.0)
Retic Ct Pct: 1.3 % (ref 0.4–3.1)

## 2020-02-09 LAB — SAMPLE TO BLOOD BANK

## 2020-02-09 MED ORDER — HEPARIN SOD (PORK) LOCK FLUSH 100 UNIT/ML IV SOLN
500.0000 [IU] | Freq: Once | INTRAVENOUS | Status: AC | PRN
  Administered 2020-02-09: 500 [IU]
  Filled 2020-02-09: qty 5

## 2020-02-09 MED ORDER — PALONOSETRON HCL INJECTION 0.25 MG/5ML
INTRAVENOUS | Status: AC
  Filled 2020-02-09: qty 5

## 2020-02-09 MED ORDER — SODIUM CHLORIDE 0.9 % IV SOLN
10.0000 mg | Freq: Once | INTRAVENOUS | Status: AC
  Administered 2020-02-09: 10 mg via INTRAVENOUS
  Filled 2020-02-09: qty 10

## 2020-02-09 MED ORDER — SODIUM CHLORIDE 0.9 % IV SOLN
75.0000 mg/m2 | Freq: Once | INTRAVENOUS | Status: AC
  Administered 2020-02-09: 125 mg via INTRAVENOUS
  Filled 2020-02-09: qty 5

## 2020-02-09 MED ORDER — SODIUM CHLORIDE 0.9% FLUSH
10.0000 mL | INTRAVENOUS | Status: DC | PRN
  Administered 2020-02-09: 10 mL
  Filled 2020-02-09: qty 10

## 2020-02-09 MED ORDER — SODIUM CHLORIDE 0.9% FLUSH
10.0000 mL | Freq: Once | INTRAVENOUS | Status: AC
  Administered 2020-02-09: 10 mL via INTRAVENOUS
  Filled 2020-02-09: qty 10

## 2020-02-09 MED ORDER — SODIUM CHLORIDE 0.9 % IV SOLN
Freq: Once | INTRAVENOUS | Status: AC
  Filled 2020-02-09: qty 250

## 2020-02-09 MED ORDER — PALONOSETRON HCL INJECTION 0.25 MG/5ML
0.2500 mg | Freq: Once | INTRAVENOUS | Status: AC
  Administered 2020-02-09: 0.25 mg via INTRAVENOUS

## 2020-02-09 NOTE — Progress Notes (Signed)
Reviewed pt labs with Dr. Marin Olp and pt ok to treat with creatinine of 1.57

## 2020-02-09 NOTE — Progress Notes (Signed)
Hematology and Oncology Follow Up Visit  [name] [id number] 05/21/1930 86 y.o. 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 Palliative radiation therapy to right ilium Kyprolis/Cytoxanq 3 week dosing- s/p cycle 24 (Cytoxan restarted on cycle 13) - DC'd due to progression Radiation therapy to right femur, 10 fraction --completed 3000 rad on 11/06/2018 Elotuzomab - started 10/29/2018 -- s/p cycle #1 -- d/c on 12/24/2018 Pomalidomide 2 mg po q day (21 on/7 off) - started 10/29/2018 -- d/c 12/31/2018 Daratumumab - start cycle # 1 on 12/31/2018 -- d/c on -02/04/2019  Current Therapy: Bendamustine/Decadron -- started on 02/10/2019, s/p cycle10 Zometa IV q 4 weeks - on hold Retacrit needed for hemoglobin less than 10 IVIG 40g IV q month -- start on 07/2019   Interim History:  [name] is here today for follow-up and treatment. He is doing well and continues to improve. His weight is up 5 lbs since his last visit.  He has eating well and supplementing with a high protein Boost once a day. He does feel that he hydrates properly throughout the day.  Earlier this month, M-spike was 0.2, IgG level 398 and lambda light chains 27.6 mg/L.  He still has a little trouble swallowing some pills. He follows up with GI again next month. He has not had any episodes of choking.  No fever, chills, n/v, cough, rash, dizziness, SOB, chest pain, palpitations, abdominal pain or changes in bowel or bladder habits.  He occasionally takes an imodium if he feels he may have diarrhea and so far this has been effective.  The neuropthy in his fingertips and feet seems to be a little more intense. He states that it is tolerable  at this time. He denies falls or syncope.  The swelling in his legs is stable. He is wearing compression stockings during the day. Pedal pulses are 2+.  No episodes of bleeding. No bruising or petechiae.   ECOG Performance Status: 1 - Symptomatic but completely ambulatory  Medications:  Allergies as of 02/09/2020      Reactions   Sulfa Antibiotics Itching   Sulfasalazine Itching      Medication List       Accurate as of February 09, 2020 10:47 AM. If you have any questions, ask your nurse or doctor.        ALOXI IV Inject into the vein.   amLODipine-benazepril 5-10 MG capsule Commonly known as: LOTREL TAKE ONE CAPSULE EACH DAY   bendamustine in sodium chloride 0.9 % 50 mL Inject into the vein once.   dronabinol 5 MG capsule Commonly known as: MARINOL TAKE ONE CAPSULE TWICE A DAY BEFORE A MEAL   finasteride 5 MG tablet Commonly known as: PROSCAR TAKE ONE TABLET EACH DAY What changed: See the new instructions.   lidocaine-prilocaine cream Commonly known as: EMLA Apply 1 application topically as needed.   ondansetron 4 MG tablet Commonly known as: ZOFRAN Take 1 tablet (4 mg total) by mouth every 6 (six) hours as needed for nausea.   pantoprazole 40 MG tablet Commonly known as: Protonix Take 1 tablet (40 mg total) by mouth 2 (two) times daily.   penicillin v potassium 500 MG tablet Commonly known as: VEETID Take 1 tablet (500 mg total) by mouth  4 (four) times daily.   polyvinyl alcohol 1.4 % ophthalmic solution Commonly known as: LIQUIFILM TEARS Place 1 drop into both eyes as needed for dry eyes.   predniSONE 20 MG tablet Commonly known as: DELTASONE TAKE ONE TABLET EACH DAY WITH BREAKFAST   PROBIOTIC PO Take 1 capsule by mouth daily.   pyridOXINE 100 MG tablet Commonly known as: VITAMIN B-6 Take 100 mg by mouth daily.   QC MULTI-VITE 50 & OVER PO TAKE ONE TABLET DAILY   traMADol 50 MG tablet Commonly known as: ULTRAM Take 1 tablet (50 mg total) by  mouth every 8 (eight) hours as needed for moderate pain.   vitamin B-12 500 MCG tablet Commonly known as: CYANOCOBALAMIN Take 500 mcg by mouth daily.   Vitamin D (Ergocalciferol) 1.25 MG (50000 UNIT) Caps capsule Commonly known as: DRISDOL Take 50,000 Units by mouth every Sunday.       Allergies:  Allergies  Allergen Reactions  . Sulfa Antibiotics Itching  . Sulfasalazine Itching    Past Medical History, Surgical history, Social history, and Family History were reviewed and updated.  Review of Systems: All other 10 point review of systems is negative.   Physical Exam:  vitals were not taken for this visit.   Wt Readings from Last 3 Encounters:  01/12/20 138 lb (62.6 kg)  12/15/19 141 lb 8 oz (64.2 kg)  11/24/19 138 lb (62.6 kg)    Ocular: Sclerae unicteric, pupils equal, round and reactive to light Ear-nose-throat: Oropharynx clear, dentition fair Lymphatic: No cervical or supraclavicular adenopathy Lungs no rales or rhonchi, good excursion bilaterally Heart regular rate and rhythm, no murmur appreciated Abd soft, nontender, positive bowel sounds, no liver or spleen tip palpated on exam, no fluid wave  MSK no focal spinal tenderness, no joint edema Neuro: non-focal, well-oriented, appropriate affect Breasts: Deferred   Lab Results  Component Value Date   WBC 5.4 01/12/2020   HGB 9.3 (L) 01/12/2020   HCT 27.7 (L) 01/12/2020   MCV 95.8 01/12/2020   PLT 115 (L) 01/12/2020   Lab Results  Component Value Date   FERRITIN 386 (H) 01/12/2020   IRON 120 01/12/2020   TIBC 235 01/12/2020   UIBC 115 (L) 01/12/2020   IRONPCTSAT 51 01/12/2020   Lab Results  Component Value Date   RETICCTPCT 1.3 12/10/2018   RBC 2.89 (L) 01/12/2020   Lab Results  Component Value Date   KPAFRELGTCHN 1.4 (L) 01/12/2020   LAMBDASER 27.6 (H) 01/12/2020   KAPLAMBRATIO 0.05 (L) 01/12/2020   Lab Results  Component Value Date   IGGSERUM 398 (L) 01/12/2020   IGA 6 (L) 01/12/2020    IGMSERUM 14 (L) 01/12/2020   Lab Results  Component Value Date   TOTALPROTELP 4.9 (L) 01/12/2020   ALBUMINELP 2.8 (L) 01/12/2020   A1GS 0.2 01/12/2020   A2GS 0.8 01/12/2020   BETS 0.7 01/12/2020   BETA2SER 0.3 07/28/2015   GAMS 0.4 01/12/2020   MSPIKE 0.2 (H) 01/12/2020   SPEI Comment 12/15/2019     Chemistry      Component Value Date/Time   NA 141 01/12/2020 1153   NA 146 (H) 08/07/2017 1153   NA 141 01/09/2017 1004   K 3.8 01/12/2020 1153   K 4.6 08/07/2017 1153   K 4.4 01/09/2017 1004   CL 110 01/12/2020 1153   CL 108 08/07/2017 1153   CO2 24 01/12/2020 1153   CO2 26 08/07/2017 1153   CO2 22 01/09/2017 1004   BUN 42 (H) 01/12/2020  1153   BUN 24 (H) 08/07/2017 1153   BUN 23.9 01/09/2017 1004   CREATININE 1.76 (H) 01/12/2020 1153   CREATININE 1.56 (H) 05/26/2018 1508   CREATININE 1.5 (H) 01/09/2017 1004      Component Value Date/Time   CALCIUM 8.4 (L) 01/12/2020 1153   CALCIUM 8.8 08/07/2017 1153   CALCIUM 8.8 01/09/2017 1004   ALKPHOS 42 01/12/2020 1153   ALKPHOS 97 (H) 08/07/2017 1153   ALKPHOS 80 01/09/2017 1004   AST 27 01/12/2020 1153   AST 18 01/09/2017 1004   ALT 15 01/12/2020 1153   ALT 20 08/07/2017 1153   ALT 12 01/09/2017 1004   BILITOT 0.6 01/12/2020 1153   BILITOT 0.92 01/09/2017 1004       Impression and Plan: Mr. Dady is a very pleasant 86yo caucasian gentleman with relapsed IgG lambda myeloma. We will proceed with treatment today as planned.  He also received Retacrit for Hgb 9.3. Dose increased to 40,000 units.  We will see him again in a month.  He will contact our office with any questions or concerns. We can certainly see him sooner if needed.   Laverna Peace, NP 6/30/202110:47 AM

## 2020-02-09 NOTE — Patient Instructions (Signed)
[facility] Discharge Instructions for Patients Receiving Chemotherapy  Today you received the following chemotherapy agents Bendeka.   To help prevent nausea and vomiting after your treatment, we encourage you to take your nausea medication as directed.    If you develop nausea and vomiting that is not controlled by your nausea medication, call the clinic.   BELOW ARE SYMPTOMS THAT SHOULD BE REPORTED IMMEDIATELY:  *FEVER GREATER THAN 100.5 F  *CHILLS WITH OR WITHOUT FEVER  NAUSEA AND VOMITING THAT IS NOT CONTROLLED WITH YOUR NAUSEA MEDICATION  *UNUSUAL SHORTNESS OF BREATH  *UNUSUAL BRUISING OR BLEEDING  TENDERNESS IN MOUTH AND THROAT WITH OR WITHOUT PRESENCE OF ULCERS  *URINARY PROBLEMS  *BOWEL PROBLEMS  UNUSUAL RASH Items with * indicate a potential emergency and should be followed up as soon as possible.  Feel free to call the clinic you have any questions or concerns. The clinic phone number is [phone].  Please show the CHEMO ALERT CARD at check-in to the Emergency Department and triage nurse.   

## 2020-02-10 ENCOUNTER — Inpatient Hospital Stay: Payer: Medicare HMO | Attending: Hematology & Oncology

## 2020-02-10 VITALS — BP 176/80 | HR 86 | Temp 97.5°F

## 2020-02-10 DIAGNOSIS — Z882 Allergy status to sulfonamides status: Secondary | ICD-10-CM | POA: Diagnosis not present

## 2020-02-10 DIAGNOSIS — R197 Diarrhea, unspecified: Secondary | ICD-10-CM | POA: Insufficient documentation

## 2020-02-10 DIAGNOSIS — C9002 Multiple myeloma in relapse: Secondary | ICD-10-CM

## 2020-02-10 DIAGNOSIS — N289 Disorder of kidney and ureter, unspecified: Secondary | ICD-10-CM | POA: Diagnosis not present

## 2020-02-10 DIAGNOSIS — G629 Polyneuropathy, unspecified: Secondary | ICD-10-CM | POA: Insufficient documentation

## 2020-02-10 DIAGNOSIS — D631 Anemia in chronic kidney disease: Secondary | ICD-10-CM | POA: Insufficient documentation

## 2020-02-10 DIAGNOSIS — Z79899 Other long term (current) drug therapy: Secondary | ICD-10-CM | POA: Insufficient documentation

## 2020-02-10 DIAGNOSIS — Z5111 Encounter for antineoplastic chemotherapy: Secondary | ICD-10-CM | POA: Diagnosis present

## 2020-02-10 DIAGNOSIS — C9000 Multiple myeloma not having achieved remission: Secondary | ICD-10-CM

## 2020-02-10 LAB — IRON AND TIBC
Iron: 84 ug/dL (ref 42–163)
Saturation Ratios: 38 % (ref 20–55)
TIBC: 223 ug/dL (ref 202–409)
UIBC: 139 ug/dL (ref 117–376)

## 2020-02-10 LAB — IGG, IGA, IGM
IgA: <5 mg/dL — ABNORMAL LOW (ref 61–437)
IgG (Immunoglobin G), Serum: 285 mg/dL — ABNORMAL LOW (ref 603–1613)
IgM (Immunoglobulin M), Srm: 6 mg/dL — ABNORMAL LOW (ref 15–143)

## 2020-02-10 LAB — PROTEIN ELECTROPHORESIS, SERUM
A/G Ratio: 1.3 (ref 0.7–1.7)
Albumin ELP: 2.6 g/dL — ABNORMAL LOW (ref 2.9–4.4)
Alpha-1-Globulin: 0.2 g/dL (ref 0.0–0.4)
Alpha-2-Globulin: 0.7 g/dL (ref 0.4–1.0)
Beta Globulin: 0.7 g/dL (ref 0.7–1.3)
Gamma Globulin: 0.3 g/dL — ABNORMAL LOW (ref 0.4–1.8)
Globulin, Total: 2 g/dL — ABNORMAL LOW (ref 2.2–3.9)
M-Spike, %: 0.1 g/dL — ABNORMAL HIGH
Total Protein ELP: 4.6 g/dL — ABNORMAL LOW (ref 6.0–8.5)

## 2020-02-10 LAB — KAPPA/LAMBDA LIGHT CHAINS
Kappa free light chain: 1.7 mg/L — ABNORMAL LOW (ref 3.3–19.4)
Kappa, lambda light chain ratio: 0.07 — ABNORMAL LOW (ref 0.26–1.65)
Lambda free light chains: 23 mg/L (ref 5.7–26.3)

## 2020-02-10 LAB — FERRITIN: Ferritin: 366 ng/mL — ABNORMAL HIGH (ref 24–336)

## 2020-02-10 MED ORDER — SODIUM CHLORIDE 0.9 % IV SOLN
10.0000 mg | Freq: Once | INTRAVENOUS | Status: AC
  Administered 2020-02-10: 10 mg via INTRAVENOUS
  Filled 2020-02-10: qty 10

## 2020-02-10 MED ORDER — EPOETIN ALFA-EPBX 40000 UNIT/ML IJ SOLN
40000.0000 [IU] | Freq: Once | INTRAMUSCULAR | Status: AC
  Administered 2020-02-10: 40000 [IU] via SUBCUTANEOUS

## 2020-02-10 MED ORDER — SODIUM CHLORIDE 0.9% FLUSH
10.0000 mL | Freq: Once | INTRAVENOUS | Status: AC
  Administered 2020-02-10: 10 mL via INTRAVENOUS
  Filled 2020-02-10: qty 10

## 2020-02-10 MED ORDER — SODIUM CHLORIDE 0.9 % IV SOLN
75.0000 mg/m2 | Freq: Once | INTRAVENOUS | Status: AC
  Administered 2020-02-10: 125 mg via INTRAVENOUS
  Filled 2020-02-10: qty 5

## 2020-02-10 MED ORDER — SODIUM CHLORIDE 0.9 % IV SOLN
Freq: Once | INTRAVENOUS | Status: AC
  Filled 2020-02-10: qty 250

## 2020-02-10 MED ORDER — EPOETIN ALFA-EPBX 40000 UNIT/ML IJ SOLN
INTRAMUSCULAR | Status: AC
  Filled 2020-02-10: qty 1

## 2020-02-10 MED ORDER — HEPARIN SOD (PORK) LOCK FLUSH 100 UNIT/ML IV SOLN
500.0000 [IU] | Freq: Once | INTRAVENOUS | Status: AC
  Administered 2020-02-10: 500 [IU] via INTRAVENOUS
  Filled 2020-02-10: qty 5

## 2020-02-10 NOTE — Patient Instructions (Signed)
Bendamustine Injection What is this medicine? BENDAMUSTINE (BEN da MUS teen) is a chemotherapy drug. It is used to treat chronic lymphocytic leukemia and non-Hodgkin lymphoma. This medicine may be used for other purposes; ask your health care provider or pharmacist if you have questions. COMMON BRAND NAME(S): Kristine Royal, Treanda What should I tell my health care provider before I take this medicine? They need to know if you have any of these conditions:  infection (especially a virus infection such as chickenpox, cold sores, or herpes)  kidney disease  liver disease  an unusual or allergic reaction to bendamustine, mannitol, other medicines, foods, dyes, or preservatives  pregnant or trying to get pregnant  breast-feeding How should I use this medicine? This medicine is for infusion into a vein. It is given by a health care professional in a hospital or clinic setting. Talk to your pediatrician regarding the use of this medicine in children. Special care may be needed. Overdosage: If you think you have taken too much of this medicine contact a poison control center or emergency room at once. NOTE: This medicine is only for you. Do not share this medicine with others. What if I miss a dose? It is important not to miss your dose. Call your doctor or health care professional if you are unable to keep an appointment. What may interact with this medicine? Do not take this medicine with any of the following medications:  clozapine This medicine may also interact with the following medications:  atazanavir  cimetidine  ciprofloxacin  enoxacin  fluvoxamine  medicines for seizures like carbamazepine and phenobarbital  mexiletine  rifampin  tacrine  thiabendazole  zileuton This list may not describe all possible interactions. Give your health care provider a list of all the medicines, herbs, non-prescription drugs, or dietary supplements you use. Also tell them if  you smoke, drink alcohol, or use illegal drugs. Some items may interact with your medicine. What should I watch for while using this medicine? This drug may make you feel generally unwell. This is not uncommon, as chemotherapy can affect healthy cells as well as cancer cells. Report any side effects. Continue your course of treatment even though you feel ill unless your doctor tells you to stop. You may need blood work done while you are taking this medicine. Call your doctor or healthcare provider for advice if you get a fever, chills or sore throat, or other symptoms of a cold or flu. Do not treat yourself. This drug decreases your body's ability to fight infections. Try to avoid being around people who are sick. This medicine may cause serious skin reactions. They can happen weeks to months after starting the medicine. Contact your healthcare provider right away if you notice fevers or flu-like symptoms with a rash. The rash may be red or purple and then turn into blisters or peeling of the skin. Or, you might notice a red rash with swelling of the face, lips or lymph nodes in your neck or under your arms. This medicine may increase your risk to bruise or bleed. Call your doctor or healthcare provider if you notice any unusual bleeding. Talk to your doctor about your risk of cancer. You may be more at risk for certain types of cancers if you take this medicine. Do not become pregnant while taking this medicine or for at least 6 months after stopping it. Women should inform their doctor if they wish to become pregnant or think they might be pregnant. Men should not  father a child while taking this medicine and for at least 3 months after stopping it. There is a potential for serious side effects to an unborn child. Talk to your healthcare provider or pharmacist for more information. Do not breast-feed an infant while taking this medicine or for at least 1 week after stopping it. This medicine may make it  more difficult to father a child. You should talk with your doctor or healthcare provider if you are concerned about your fertility. What side effects may I notice from receiving this medicine? Side effects that you should report to your doctor or health care professional as soon as possible:  allergic reactions like skin rash, itching or hives, swelling of the face, lips, or tongue  low blood counts - this medicine may decrease the number of white blood cells, red blood cells and platelets. You may be at increased risk for infections and bleeding.  rash, fever, and swollen lymph nodes  redness, blistering, peeling, or loosening of the skin, including inside the mouth  signs of infection like fever or chills, cough, sore throat, pain or difficulty passing urine  signs of decreased platelets or bleeding like bruising, pinpoint red spots on the skin, black, tarry stools, blood in the urine  signs of decreased red blood cells like being unusually weak or tired, fainting spells, lightheadedness  signs and symptoms of kidney injury like trouble passing urine or change in the amount of urine  signs and symptoms of liver injury like dark yellow or brown urine; general ill feeling or flu-like symptoms; light-colored stools; loss of appetite; nausea; right upper belly pain; unusually weak or tired; yellowing of the eyes or skin Side effects that usually do not require medical attention (report to your doctor or health care professional if they continue or are bothersome):  constipation  decreased appetite  diarrhea  headache  mouth sores  nausea, vomiting  tiredness This list may not describe all possible side effects. Call your doctor for medical advice about side effects. You may report side effects to FDA at 1-800-FDA-1088. Where should I keep my medicine? This drug is given in a hospital or clinic and will not be stored at home. NOTE: This sheet is a summary. It may not cover all  possible information. If you have questions about this medicine, talk to your doctor, pharmacist, or health care provider.  2020 Elsevier/Gold Standard (2018-10-20 10:26:46) Dexamethasone injection What is this medicine? DEXAMETHASONE (dex a METH a sone) is a corticosteroid. It is used to treat inflammation of the skin, joints, lungs, and other organs. Common conditions treated include asthma, allergies, and arthritis. It is also used for other conditions, like blood disorders and diseases of the adrenal glands. This medicine may be used for other purposes; ask your health care provider or pharmacist if you have questions. COMMON BRAND NAME(S): Decadron, DoubleDex, Simplist Dexamethasone, Solurex What should I tell my health care provider before I take this medicine? They need to know if you have any of these conditions:  Cushing's syndrome  diabetes  glaucoma  heart disease  high blood pressure  infection like herpes, measles, tuberculosis, or chickenpox  kidney disease  liver disease  mental illness  myasthenia gravis  osteoporosis  previous heart attack  seizures  stomach or intestine problems  thyroid disease  an unusual or allergic reaction to dexamethasone, corticosteroids, other medicines, lactose, foods, dyes, or preservatives  pregnant or trying to get pregnant  breast-feeding How should I use this medicine? This medicine  is for injection into a muscle, joint, lesion, soft tissue, or vein. It is given by a health care professional in a hospital or clinic setting. Talk to your pediatrician regarding the use of this medicine in children. Special care may be needed. Overdosage: If you think you have taken too much of this medicine contact a poison control center or emergency room at once. NOTE: This medicine is only for you. Do not share this medicine with others. What if I miss a dose? This may not apply. If you are having a series of injections over a  prolonged period, try not to miss an appointment. Call your doctor or health care professional to reschedule if you are unable to keep an appointment. What may interact with this medicine? Do not take this medicine with any of the following medications:  live virus vaccines This medicine may also interact with the following medications:  aminoglutethimide  amphotericin B  aspirin and aspirin-like medicines  certain antibiotics like erythromycin, clarithromycin, and troleandomycin  certain antivirals for HIV or hepatitis  certain medicines for seizures like carbamazepine, phenobarbital, phenytoin  certain medicines to treat myasthenia gravis  cholestyramine  cyclosporine  digoxin  diuretics  ephedrine  male hormones, like estrogen or progestins and birth control pills  insulin or other medicines for diabetes  isoniazid  ketoconazole  medicines that relax muscles for surgery  mifepristone  NSAIDs, medicines for pain and inflammation, like ibuprofen or naproxen  rifampin  skin tests for allergies  thalidomide  vaccines  warfarin This list may not describe all possible interactions. Give your health care provider a list of all the medicines, herbs, non-prescription drugs, or dietary supplements you use. Also tell them if you smoke, drink alcohol, or use illegal drugs. Some items may interact with your medicine. What should I watch for while using this medicine? Visit your health care professional for regular checks on your progress. Tell your health care professional if your symptoms do not start to get better or if they get worse. Your condition will be monitored carefully while you are receiving this medicine. Wear a medical ID bracelet or chain. Carry a card that describes your disease and details of your medicine and dosage times. This medicine may increase your risk of getting an infection. Call your health care professional for advice if you get a fever,  chills, or sore throat, or other symptoms of a cold or flu. Do not treat yourself. Try to avoid being around people who are sick. Call your health care professional if you are around anyone with measles, chickenpox, or if you develop sores or blisters that do not heal properly. If you are going to need surgery or other procedures, tell your doctor or health care professional that you have taken this medicine within the last 12 months. Ask your doctor or health care professional about your diet. You may need to lower the amount of salt you eat. This medicine may increase blood sugar. Ask your healthcare provider if changes in diet or medicines are needed if you have diabetes. What side effects may I notice from receiving this medicine? Side effects that you should report to your doctor or health care professional as soon as possible:  allergic reactions like skin rash, itching or hives, swelling of the face, lips, or tongue  bloody or black, tarry stools  changes in emotions or moods  changes in vision  confusion, excitement, restlessness  depressed mood  eye pain  hallucinations  muscle weakness  severe  or sudden stomach or belly pain  signs and symptoms of high blood sugar such as being more thirsty or hungry or having to urinate more than normal. You may also feel very tired or have blurry vision.  signs and symptoms of infection like fever; chills; cough; sore throat; pain or trouble passing urine  swelling of ankles, feet  unusual bruising or bleeding  wounds that do not heal Side effects that usually do not require medical attention (report to your doctor or health care professional if they continue or are bothersome):  increased appetite  increased growth of face or body hair  headache  nausea, vomiting  pain, redness, or irritation at site where injected  skin problems, acne, thin and shiny skin  trouble sleeping  weight gain This list may not describe all  possible side effects. Call your doctor for medical advice about side effects. You may report side effects to FDA at 1-800-FDA-1088. Where should I keep my medicine? This medicine is given in a hospital or clinic and will not be stored at home. NOTE: This sheet is a summary. It may not cover all possible information. If you have questions about this medicine, talk to your doctor, pharmacist, or health care provider.  2020 Elsevier/Gold Standard (2019-02-09 13:51:58)

## 2020-02-15 ENCOUNTER — Ambulatory Visit (INDEPENDENT_AMBULATORY_CARE_PROVIDER_SITE_OTHER): Payer: Medicare HMO | Admitting: *Deleted

## 2020-02-15 DIAGNOSIS — I63412 Cerebral infarction due to embolism of left middle cerebral artery: Secondary | ICD-10-CM | POA: Diagnosis not present

## 2020-02-15 LAB — CUP PACEART REMOTE DEVICE CHECK
Date Time Interrogation Session: 20210705230145
Implantable Pulse Generator Implant Date: 20200909

## 2020-02-16 NOTE — Progress Notes (Signed)
Carelink Summary Report / Loop Recorder 

## 2020-02-17 ENCOUNTER — Ambulatory Visit: Payer: Medicare HMO | Admitting: Gastroenterology

## 2020-02-24 ENCOUNTER — Telehealth: Payer: Self-pay | Admitting: *Deleted

## 2020-02-24 ENCOUNTER — Other Ambulatory Visit: Payer: Self-pay | Admitting: Family

## 2020-02-24 NOTE — Telephone Encounter (Signed)
Message received from patient stating that his BP is 190/102 at [facility] and was instructed to contact Dr. Marin Olp to notify him of elevated BP per [facility].  Call placed back to patient and patient notified per order of S. Munford NP to contact his PCP regarding elevated BP.  Pt appreciative of call back and states that he will contact his PCP now.

## 2020-03-03 ENCOUNTER — Ambulatory Visit: Payer: Medicare HMO | Admitting: Nurse Practitioner

## 2020-03-08 ENCOUNTER — Inpatient Hospital Stay: Payer: Medicare HMO

## 2020-03-08 ENCOUNTER — Inpatient Hospital Stay (HOSPITAL_BASED_OUTPATIENT_CLINIC_OR_DEPARTMENT_OTHER): Payer: Medicare HMO | Admitting: Family

## 2020-03-08 ENCOUNTER — Encounter: Payer: Self-pay | Admitting: Family

## 2020-03-08 ENCOUNTER — Other Ambulatory Visit: Payer: Self-pay

## 2020-03-08 VITALS — BP 173/67 | HR 85 | Temp 98.1°F | Resp 18 | Wt 136.0 lb

## 2020-03-08 DIAGNOSIS — C9000 Multiple myeloma not having achieved remission: Secondary | ICD-10-CM

## 2020-03-08 DIAGNOSIS — Z5111 Encounter for antineoplastic chemotherapy: Secondary | ICD-10-CM | POA: Diagnosis not present

## 2020-03-08 DIAGNOSIS — C9002 Multiple myeloma in relapse: Secondary | ICD-10-CM | POA: Diagnosis not present

## 2020-03-08 DIAGNOSIS — G620 Drug-induced polyneuropathy: Secondary | ICD-10-CM | POA: Diagnosis not present

## 2020-03-08 DIAGNOSIS — D649 Anemia, unspecified: Secondary | ICD-10-CM

## 2020-03-08 DIAGNOSIS — D508 Other iron deficiency anemias: Secondary | ICD-10-CM

## 2020-03-08 LAB — CMP (CANCER CENTER ONLY)
ALT: 15 U/L (ref 0–44)
AST: 28 U/L (ref 15–41)
Albumin: 3.3 g/dL — ABNORMAL LOW (ref 3.5–5.0)
Alkaline Phosphatase: 50 U/L (ref 38–126)
Anion gap: 8 (ref 5–15)
BUN: 43 mg/dL — ABNORMAL HIGH (ref 8–23)
CO2: 24 mmol/L (ref 22–32)
Calcium: 8.5 mg/dL — ABNORMAL LOW (ref 8.9–10.3)
Chloride: 108 mmol/L (ref 98–111)
Creatinine: 1.82 mg/dL — ABNORMAL HIGH (ref 0.61–1.24)
GFR, Est AFR Am: 38 mL/min — ABNORMAL LOW (ref >60)
GFR, Estimated: 33 mL/min — ABNORMAL LOW (ref >60)
Glucose, Bld: 138 mg/dL — ABNORMAL HIGH (ref 70–99)
Potassium: 3.5 mmol/L (ref 3.5–5.1)
Sodium: 140 mmol/L (ref 135–145)
Total Bilirubin: 0.6 mg/dL (ref 0.3–1.2)
Total Protein: 5.1 g/dL — ABNORMAL LOW (ref 6.5–8.1)

## 2020-03-08 LAB — CBC WITH DIFFERENTIAL (CANCER CENTER ONLY)
Abs Immature Granulocytes: 0.53 10*3/uL — ABNORMAL HIGH (ref 0.00–0.07)
Basophils Absolute: 0 10*3/uL (ref 0.0–0.1)
Basophils Relative: 1 %
Eosinophils Absolute: 0.1 10*3/uL (ref 0.0–0.5)
Eosinophils Relative: 1 %
HCT: 26.9 % — ABNORMAL LOW (ref 39.0–52.0)
Hemoglobin: 8.9 g/dL — ABNORMAL LOW (ref 13.0–17.0)
Immature Granulocytes: 12 %
Lymphocytes Relative: 1 %
Lymphs Abs: 0.1 10*3/uL — ABNORMAL LOW (ref 0.7–4.0)
MCH: 31.2 pg (ref 26.0–34.0)
MCHC: 33.1 g/dL (ref 30.0–36.0)
MCV: 94.4 fL (ref 80.0–100.0)
Monocytes Absolute: 0.4 10*3/uL (ref 0.1–1.0)
Monocytes Relative: 9 %
Neutro Abs: 3.3 10*3/uL (ref 1.7–7.7)
Neutrophils Relative %: 76 %
Platelet Count: 115 10*3/uL — ABNORMAL LOW (ref 150–400)
RBC: 2.85 MIL/uL — ABNORMAL LOW (ref 4.22–5.81)
RDW: 15 % (ref 11.5–15.5)
WBC Count: 4.4 10*3/uL (ref 4.0–10.5)
nRBC: 0 % (ref 0.0–0.2)

## 2020-03-08 LAB — RETICULOCYTES
Immature Retic Fract: 9.5 % (ref 2.3–15.9)
RBC.: 2.81 MIL/uL — ABNORMAL LOW (ref 4.22–5.81)
Retic Count, Absolute: 20.5 10*3/uL (ref 19.0–186.0)
Retic Ct Pct: 0.7 % (ref 0.4–3.1)

## 2020-03-08 LAB — SAMPLE TO BLOOD BANK

## 2020-03-08 MED ORDER — SODIUM CHLORIDE 0.9 % IV SOLN
Freq: Once | INTRAVENOUS | Status: AC
  Filled 2020-03-08: qty 250

## 2020-03-08 MED ORDER — DULOXETINE HCL 60 MG PO CPEP: 60.0000 mg | ORAL_CAPSULE | Freq: Every day | ORAL | 3 refills | Status: DC

## 2020-03-08 MED ORDER — SODIUM CHLORIDE 0.9 % IV SOLN
Freq: Once | INTRAVENOUS | Status: DC
  Filled 2020-03-08: qty 250

## 2020-03-08 MED ORDER — EPOETIN ALFA-EPBX 40000 UNIT/ML IJ SOLN
INTRAMUSCULAR | Status: AC
  Filled 2020-03-08: qty 1

## 2020-03-08 MED ORDER — HEPARIN SOD (PORK) LOCK FLUSH 100 UNIT/ML IV SOLN
500.0000 [IU] | Freq: Once | INTRAVENOUS | Status: AC | PRN
  Administered 2020-03-08: 500 [IU]
  Filled 2020-03-08: qty 5

## 2020-03-08 MED ORDER — PALONOSETRON HCL INJECTION 0.25 MG/5ML
INTRAVENOUS | Status: AC
  Filled 2020-03-08: qty 5

## 2020-03-08 MED ORDER — EPOETIN ALFA-EPBX 40000 UNIT/ML IJ SOLN
40000.0000 [IU] | Freq: Once | INTRAMUSCULAR | Status: AC
  Administered 2020-03-08: 40000 [IU] via SUBCUTANEOUS

## 2020-03-08 MED ORDER — PALONOSETRON HCL INJECTION 0.25 MG/5ML
0.2500 mg | Freq: Once | INTRAVENOUS | Status: AC
  Administered 2020-03-08: 0.25 mg via INTRAVENOUS

## 2020-03-08 MED ORDER — SODIUM CHLORIDE 0.9 % IV SOLN
75.0000 mg/m2 | Freq: Once | INTRAVENOUS | Status: AC
  Administered 2020-03-08: 125 mg via INTRAVENOUS
  Filled 2020-03-08: qty 5

## 2020-03-08 MED ORDER — SODIUM CHLORIDE 0.9 % IV SOLN
10.0000 mg | Freq: Once | INTRAVENOUS | Status: AC
  Administered 2020-03-08: 10 mg via INTRAVENOUS
  Filled 2020-03-08: qty 10

## 2020-03-08 MED ORDER — SODIUM CHLORIDE 0.9% FLUSH
10.0000 mL | INTRAVENOUS | Status: DC | PRN
  Administered 2020-03-08: 10 mL
  Filled 2020-03-08: qty 10

## 2020-03-08 NOTE — Patient Instructions (Signed)
Bendamustine Injection What is this medicine? BENDAMUSTINE (BEN da MUS teen) is a chemotherapy drug. It is used to treat chronic lymphocytic leukemia and non-Hodgkin lymphoma. This medicine may be used for other purposes; ask your health care provider or pharmacist if you have questions. COMMON BRAND NAME(S): BELRAPZO, BENDEKA, Treanda What should I tell my health care provider before I take this medicine? They need to know if you have any of these conditions:  infection (especially a virus infection such as chickenpox, cold sores, or herpes)  kidney disease  liver disease  an unusual or allergic reaction to bendamustine, mannitol, other medicines, foods, dyes, or preservatives  pregnant or trying to get pregnant  breast-feeding How should I use this medicine? This medicine is for infusion into a vein. It is given by a health care professional in a hospital or clinic setting. Talk to your pediatrician regarding the use of this medicine in children. Special care may be needed. Overdosage: If you think you have taken too much of this medicine contact a poison control center or emergency room at once. NOTE: This medicine is only for you. Do not share this medicine with others. What if I miss a dose? It is important not to miss your dose. Call your doctor or health care professional if you are unable to keep an appointment. What may interact with this medicine? Do not take this medicine with any of the following medications:  clozapine This medicine may also interact with the following medications:  atazanavir  cimetidine  ciprofloxacin  enoxacin  fluvoxamine  medicines for seizures like carbamazepine and phenobarbital  mexiletine  rifampin  tacrine  thiabendazole  zileuton This list may not describe all possible interactions. Give your health care provider a list of all the medicines, herbs, non-prescription drugs, or dietary supplements you use. Also tell them if  you smoke, drink alcohol, or use illegal drugs. Some items may interact with your medicine. What should I watch for while using this medicine? This drug may make you feel generally unwell. This is not uncommon, as chemotherapy can affect healthy cells as well as cancer cells. Report any side effects. Continue your course of treatment even though you feel ill unless your doctor tells you to stop. You may need blood work done while you are taking this medicine. Call your doctor or healthcare provider for advice if you get a fever, chills or sore throat, or other symptoms of a cold or flu. Do not treat yourself. This drug decreases your body's ability to fight infections. Try to avoid being around people who are sick. This medicine may cause serious skin reactions. They can happen weeks to months after starting the medicine. Contact your healthcare provider right away if you notice fevers or flu-like symptoms with a rash. The rash may be red or purple and then turn into blisters or peeling of the skin. Or, you might notice a red rash with swelling of the face, lips or lymph nodes in your neck or under your arms. This medicine may increase your risk to bruise or bleed. Call your doctor or healthcare provider if you notice any unusual bleeding. Talk to your doctor about your risk of cancer. You may be more at risk for certain types of cancers if you take this medicine. Do not become pregnant while taking this medicine or for at least 6 months after stopping it. Women should inform their doctor if they wish to become pregnant or think they might be pregnant. Men should not   father a child while taking this medicine and for at least 3 months after stopping it. There is a potential for serious side effects to an unborn child. Talk to your healthcare provider or pharmacist for more information. Do not breast-feed an infant while taking this medicine or for at least 1 week after stopping it. This medicine may make it  more difficult to father a child. You should talk with your doctor or healthcare provider if you are concerned about your fertility. What side effects may I notice from receiving this medicine? Side effects that you should report to your doctor or health care professional as soon as possible:  allergic reactions like skin rash, itching or hives, swelling of the face, lips, or tongue  low blood counts - this medicine may decrease the number of white blood cells, red blood cells and platelets. You may be at increased risk for infections and bleeding.  rash, fever, and swollen lymph nodes  redness, blistering, peeling, or loosening of the skin, including inside the mouth  signs of infection like fever or chills, cough, sore throat, pain or difficulty passing urine  signs of decreased platelets or bleeding like bruising, pinpoint red spots on the skin, black, tarry stools, blood in the urine  signs of decreased red blood cells like being unusually weak or tired, fainting spells, lightheadedness  signs and symptoms of kidney injury like trouble passing urine or change in the amount of urine  signs and symptoms of liver injury like dark yellow or brown urine; general ill feeling or flu-like symptoms; light-colored stools; loss of appetite; nausea; right upper belly pain; unusually weak or tired; yellowing of the eyes or skin Side effects that usually do not require medical attention (report to your doctor or health care professional if they continue or are bothersome):  constipation  decreased appetite  diarrhea  headache  mouth sores  nausea, vomiting  tiredness This list may not describe all possible side effects. Call your doctor for medical advice about side effects. You may report side effects to FDA at 1-800-FDA-1088. Where should I keep my medicine? This drug is given in a hospital or clinic and will not be stored at home. NOTE: This sheet is a summary. It may not cover all  possible information. If you have questions about this medicine, talk to your doctor, pharmacist, or health care provider.  2020 [employer]vier/Gold Standard (2018-10-20 10:26:46)  

## 2020-03-08 NOTE — Progress Notes (Signed)
Per dr Marin Olp okay to treat today.  Will give an extra 250 bag of saline today.

## 2020-03-08 NOTE — Progress Notes (Signed)
Hematology and Oncology Follow Up Visit  Christian Burns [id number] 05/16/1930 86 y.o. 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 Palliative radiation therapy to right ilium Kyprolis/Cytoxanq 3 week dosing- s/p cycle 24 (Cytoxan restarted on cycle 13) - DC'd due to progression Radiation therapy to right femur, 10 fraction --completed 3000 rad on 11/06/2018 Elotuzomab - started 10/29/2018 -- s/p cycle #1 -- d/c on 12/24/2018 Pomalidomide 2 mg po q day (21 on/7 off) - started 10/29/2018 -- d/c 12/31/2018 Daratumumab - start cycle # 1 on 12/31/2018 -- d/c on -02/04/2019  Current Therapy: Bendamustine/Decadron -- started on 02/10/2019, s/p cycle10 Zometa IV q 4 weeks - on hold Retacrit needed for hemoglobin less than 10 IVIG 40g IV q month -- start on 07/2019   Interim History:  Christian Burns is here today for follow-up and treatment. He is doing fairly well but still having issues with diarrhea. He has only been taking one imodium on occasion. He will try taking as directed on the bottle when needed and see if that helps better control his diarrhea.  M-spike last month was down to 0.1, IgG level 285 and lambda light chains 2.30 mg/dL. No fever, chills, n/v, cough, rash, dizziness, chest pain, palpitations, abdominal pain or changes in bowel or bladder habits.  No swelling in his extremities.  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 He is concerned that this will worsen and he won't be able to walk. He is ambulating fine now without assistance and denies any falls or syncopal episodes.  No episodes of bleeding. No bruising or petechiae.  He has maintained a good appetite and is staying  well hydrated. He feels that the Marinol is helping. He also drinking the high protein Boost daily. His weight is down 7 lbs this visit.   ECOG Performance Status: 1 - Symptomatic but completely ambulatory  Medications:  Allergies as of 03/08/2020      Reactions   Sulfa Antibiotics Itching   Sulfasalazine Itching      Medication List       Accurate as of March 08, 2020 11:45 AM. If you have any questions, ask your nurse or doctor.        amLODipine-benazepril 5-10 MG capsule Commonly known as: LOTREL TAKE ONE CAPSULE EACH DAY   dronabinol 5 MG capsule Commonly known as: MARINOL TAKE ONE CAPSULE TWICE A DAY BEFORE A MEAL   finasteride 5 MG tablet Commonly known as: PROSCAR TAKE ONE TABLET EACH DAY What changed: See the new instructions.   lidocaine-prilocaine cream Commonly known as: EMLA Apply 1 application topically as needed.   metoprolol succinate 25 MG 24 hr tablet Commonly known as: TOPROL-XL Take 25 mg by mouth daily.   ondansetron 4 MG tablet Commonly known as: ZOFRAN Take 1 tablet (4 mg total) by mouth every 6 (six) hours as needed for nausea.   pantoprazole 40 MG tablet Commonly known as: Protonix Take 1 tablet (40 mg total) by mouth 2 (two) times daily.   penicillin v potassium 500 MG tablet Commonly known as: VEETID Take 500 mg by mouth 4 (four) times daily.   polyvinyl alcohol 1.4 % ophthalmic solution Commonly known as: LIQUIFILM TEARS Place 1 drop into both eyes as  needed for dry eyes.   predniSONE 20 MG tablet Commonly known as: DELTASONE TAKE ONE TABLET EACH DAY WITH BREAKFAST   PROBIOTIC PO Take 1 capsule by mouth daily.   pyridOXINE 100 MG tablet Commonly known as: VITAMIN B-6 Take 100 mg by mouth daily.   QC MULTI-VITE 50 & OVER PO TAKE ONE TABLET DAILY   traMADol 50 MG tablet Commonly known as: ULTRAM Take 1 tablet (50 mg total) by mouth every 8 (eight) hours as needed for moderate pain.   vitamin B-12 500 MCG tablet Commonly  known as: CYANOCOBALAMIN Take 500 mcg by mouth daily.   Vitamin D (Ergocalciferol) 1.25 MG (50000 UNIT) Caps capsule Commonly known as: DRISDOL Take 50,000 Units by mouth every Sunday.       Allergies:  Allergies  Allergen Reactions  . Sulfa Antibiotics Itching  . Sulfasalazine Itching    Past Medical History, Surgical history, Social history, and Family History were reviewed and updated.  Review of Systems: All other 10 point review of systems is negative.   Physical Exam:  weight is 136 lb (61.7 kg). His oral temperature is 98.1 F (36.7 C). His blood pressure is 173/67 (abnormal) and his pulse is 85. His respiration is 18 and oxygen saturation is 99%.   Wt Readings from Last 3 Encounters:  03/08/20 136 lb (61.7 kg)  02/09/20 143 lb 14.4 oz (65.3 kg)  01/12/20 138 lb (62.6 kg)    Ocular: Sclerae unicteric, pupils equal, round and reactive to light Ear-nose-throat: Oropharynx clear, dentition fair Lymphatic: No cervical or supraclavicular adenopathy Lungs no rales or rhonchi, good excursion bilaterally Heart regular rate and rhythm, no murmur appreciated Abd soft, nontender, positive bowel sounds, no liver or spleen tip palpated on exam, no fluid wave  MSK no focal spinal tenderness, no joint edema Neuro: non-focal, well-oriented, appropriate affect Breasts: Deferred   Lab Results  Component Value Date   WBC 4.6 02/09/2020   HGB 9.3 (L) 02/09/2020   HCT 27.5 (L) 02/09/2020   MCV 97.5 02/09/2020   PLT 112 (L) 02/09/2020   Lab Results  Component Value Date   FERRITIN 366 (H) 02/09/2020   IRON 84 02/09/2020   TIBC 223 02/09/2020   UIBC 139 02/09/2020   IRONPCTSAT 38 02/09/2020   Lab Results  Component Value Date   RETICCTPCT 0.7 03/08/2020   RBC 2.81 (L) 03/08/2020   Lab Results  Component Value Date   KPAFRELGTCHN 1.7 (L) 02/09/2020   LAMBDASER 23.0 02/09/2020   KAPLAMBRATIO 0.07 (L) 02/09/2020   Lab Results  Component Value Date   IGGSERUM 285  (L) 02/09/2020   IGA <5 (L) 02/09/2020   IGMSERUM 6 (L) 02/09/2020   Lab Results  Component Value Date   TOTALPROTELP 4.6 (L) 02/09/2020   ALBUMINELP 2.6 (L) 02/09/2020   A1GS 0.2 02/09/2020   A2GS 0.7 02/09/2020   BETS 0.7 02/09/2020   BETA2SER 0.3 07/28/2015   GAMS 0.3 (L) 02/09/2020   MSPIKE 0.1 (H) 02/09/2020   SPEI Comment 02/09/2020     Chemistry      Component Value Date/Time   NA 141 02/09/2020 1043   NA 146 (H) 08/07/2017 1153   NA 141 01/09/2017 1004   K 3.8 02/09/2020 1043   K 4.6 08/07/2017 1153   K 4.4 01/09/2017 1004   CL 108 02/09/2020 1043   CL 108 08/07/2017 1153   CO2 27 02/09/2020 1043   CO2 26 08/07/2017 1153   CO2 22 01/09/2017 1004   BUN 41 (  H) 02/09/2020 1043   BUN 24 (H) 08/07/2017 1153   BUN 23.9 01/09/2017 1004   CREATININE 1.57 (H) 02/09/2020 1043   CREATININE 1.56 (H) 05/26/2018 1508   CREATININE 1.5 (H) 01/09/2017 1004      Component Value Date/Time   CALCIUM 8.5 (L) 02/09/2020 1043   CALCIUM 8.8 08/07/2017 1153   CALCIUM 8.8 01/09/2017 1004   ALKPHOS 50 02/09/2020 1043   ALKPHOS 97 (H) 08/07/2017 1153   ALKPHOS 80 01/09/2017 1004   AST 27 02/09/2020 1043   AST 18 01/09/2017 1004   ALT 16 02/09/2020 1043   ALT 20 08/07/2017 1153   ALT 12 01/09/2017 1004   BILITOT 0.6 02/09/2020 1043   BILITOT 0.92 01/09/2017 1004       Impression and Plan: Christian Burns is a very pleasant 86yo caucasian gentleman with relapsed IgG lambda myeloma. We will proceed with treatment and Retacrit today as planned.  I spoke with Dr. Marin Olp about his neuropathy. He feels that this is due to treatment he has received in the past and should not be worsened by the Bendamustine.  We will have him try Cymbalta 60 mg PO daily.  We will see him again in 1 month.  He can contact our office with any questions or concerns. We can certainly see him sooner if needed.   Laverna Peace, NP 7/28/202111:45 AM

## 2020-03-09 ENCOUNTER — Inpatient Hospital Stay: Payer: Medicare HMO

## 2020-03-09 VITALS — BP 175/84 | HR 87 | Resp 17

## 2020-03-09 DIAGNOSIS — C9000 Multiple myeloma not having achieved remission: Secondary | ICD-10-CM

## 2020-03-09 DIAGNOSIS — Z5111 Encounter for antineoplastic chemotherapy: Secondary | ICD-10-CM | POA: Diagnosis not present

## 2020-03-09 DIAGNOSIS — C9002 Multiple myeloma in relapse: Secondary | ICD-10-CM

## 2020-03-09 LAB — IGG, IGA, IGM
IgA: <5 mg/dL — ABNORMAL LOW (ref 61–437)
IgG (Immunoglobin G), Serum: 213 mg/dL — ABNORMAL LOW (ref 603–1613)
IgM (Immunoglobulin M), Srm: 10 mg/dL — ABNORMAL LOW (ref 15–143)

## 2020-03-09 LAB — IRON AND TIBC
Iron: 48 ug/dL (ref 42–163)
Saturation Ratios: 23 % (ref 20–55)
TIBC: 209 ug/dL (ref 202–409)
UIBC: 161 ug/dL (ref 117–376)

## 2020-03-09 LAB — PROTEIN ELECTROPHORESIS, SERUM
A/G Ratio: 1.2 (ref 0.7–1.7)
Albumin ELP: 2.5 g/dL — ABNORMAL LOW (ref 2.9–4.4)
Alpha-1-Globulin: 0.3 g/dL (ref 0.0–0.4)
Alpha-2-Globulin: 0.8 g/dL (ref 0.4–1.0)
Beta Globulin: 0.7 g/dL (ref 0.7–1.3)
Gamma Globulin: 0.2 g/dL — ABNORMAL LOW (ref 0.4–1.8)
Globulin, Total: 2.1 g/dL — ABNORMAL LOW (ref 2.2–3.9)
M-Spike, %: 0.1 g/dL — ABNORMAL HIGH
Total Protein ELP: 4.6 g/dL — ABNORMAL LOW (ref 6.0–8.5)

## 2020-03-09 LAB — FERRITIN: Ferritin: 455 ng/mL — ABNORMAL HIGH (ref 24–336)

## 2020-03-09 LAB — KAPPA/LAMBDA LIGHT CHAINS
Kappa free light chain: 6 mg/L (ref 3.3–19.4)
Kappa, lambda light chain ratio: 0.26 (ref 0.26–1.65)
Lambda free light chains: 23.4 mg/L (ref 5.7–26.3)

## 2020-03-09 MED ORDER — SODIUM CHLORIDE 0.9 % IV SOLN
Freq: Once | INTRAVENOUS | Status: AC
  Filled 2020-03-09: qty 250

## 2020-03-09 MED ORDER — HEPARIN SOD (PORK) LOCK FLUSH 100 UNIT/ML IV SOLN
500.0000 [IU] | Freq: Once | INTRAVENOUS | Status: AC | PRN
  Administered 2020-03-09: 500 [IU]
  Filled 2020-03-09: qty 5

## 2020-03-09 MED ORDER — SODIUM CHLORIDE 0.9 % IV SOLN
10.0000 mg | Freq: Once | INTRAVENOUS | Status: AC
  Administered 2020-03-09: 10 mg via INTRAVENOUS
  Filled 2020-03-09: qty 1

## 2020-03-09 MED ORDER — SODIUM CHLORIDE 0.9% FLUSH
10.0000 mL | INTRAVENOUS | Status: DC | PRN
  Administered 2020-03-09: 10 mL
  Filled 2020-03-09: qty 10

## 2020-03-09 MED ORDER — SODIUM CHLORIDE 0.9 % IV SOLN
75.0000 mg/m2 | Freq: Once | INTRAVENOUS | Status: AC
  Administered 2020-03-09: 125 mg via INTRAVENOUS
  Filled 2020-03-09: qty 5

## 2020-03-09 NOTE — Patient Instructions (Signed)
Bendamustine Injection What is this medicine? BENDAMUSTINE (BEN da MUS teen) is a chemotherapy drug. It is used to treat chronic lymphocytic leukemia and non-Hodgkin lymphoma. This medicine may be used for other purposes; ask your health care provider or pharmacist if you have questions. COMMON BRAND NAME(S): BELRAPZO, BENDEKA, Treanda What should I tell my health care provider before I take this medicine? They need to know if you have any of these conditions:  infection (especially a virus infection such as chickenpox, cold sores, or herpes)  kidney disease  liver disease  an unusual or allergic reaction to bendamustine, mannitol, other medicines, foods, dyes, or preservatives  pregnant or trying to get pregnant  breast-feeding How should I use this medicine? This medicine is for infusion into a vein. It is given by a health care professional in a hospital or clinic setting. Talk to your pediatrician regarding the use of this medicine in children. Special care may be needed. Overdosage: If you think you have taken too much of this medicine contact a poison control center or emergency room at once. NOTE: This medicine is only for you. Do not share this medicine with others. What if I miss a dose? It is important not to miss your dose. Call your doctor or health care professional if you are unable to keep an appointment. What may interact with this medicine? Do not take this medicine with any of the following medications:  clozapine This medicine may also interact with the following medications:  atazanavir  cimetidine  ciprofloxacin  enoxacin  fluvoxamine  medicines for seizures like carbamazepine and phenobarbital  mexiletine  rifampin  tacrine  thiabendazole  zileuton This list may not describe all possible interactions. Give your health care provider a list of all the medicines, herbs, non-prescription drugs, or dietary supplements you use. Also tell them if  you smoke, drink alcohol, or use illegal drugs. Some items may interact with your medicine. What should I watch for while using this medicine? This drug may make you feel generally unwell. This is not uncommon, as chemotherapy can affect healthy cells as well as cancer cells. Report any side effects. Continue your course of treatment even though you feel ill unless your doctor tells you to stop. You may need blood work done while you are taking this medicine. Call your doctor or healthcare provider for advice if you get a fever, chills or sore throat, or other symptoms of a cold or flu. Do not treat yourself. This drug decreases your body's ability to fight infections. Try to avoid being around people who are sick. This medicine may cause serious skin reactions. They can happen weeks to months after starting the medicine. Contact your healthcare provider right away if you notice fevers or flu-like symptoms with a rash. The rash may be red or purple and then turn into blisters or peeling of the skin. Or, you might notice a red rash with swelling of the face, lips or lymph nodes in your neck or under your arms. This medicine may increase your risk to bruise or bleed. Call your doctor or healthcare provider if you notice any unusual bleeding. Talk to your doctor about your risk of cancer. You may be more at risk for certain types of cancers if you take this medicine. Do not become pregnant while taking this medicine or for at least 6 months after stopping it. Women should inform their doctor if they wish to become pregnant or think they might be pregnant. Men should not   father a child while taking this medicine and for at least 3 months after stopping it. There is a potential for serious side effects to an unborn child. Talk to your healthcare provider or pharmacist for more information. Do not breast-feed an infant while taking this medicine or for at least 1 week after stopping it. This medicine may make it  more difficult to father a child. You should talk with your doctor or healthcare provider if you are concerned about your fertility. What side effects may I notice from receiving this medicine? Side effects that you should report to your doctor or health care professional as soon as possible:  allergic reactions like skin rash, itching or hives, swelling of the face, lips, or tongue  low blood counts - this medicine may decrease the number of white blood cells, red blood cells and platelets. You may be at increased risk for infections and bleeding.  rash, fever, and swollen lymph nodes  redness, blistering, peeling, or loosening of the skin, including inside the mouth  signs of infection like fever or chills, cough, sore throat, pain or difficulty passing urine  signs of decreased platelets or bleeding like bruising, pinpoint red spots on the skin, black, tarry stools, blood in the urine  signs of decreased red blood cells like being unusually weak or tired, fainting spells, lightheadedness  signs and symptoms of kidney injury like trouble passing urine or change in the amount of urine  signs and symptoms of liver injury like dark yellow or brown urine; general ill feeling or flu-like symptoms; light-colored stools; loss of appetite; nausea; right upper belly pain; unusually weak or tired; yellowing of the eyes or skin Side effects that usually do not require medical attention (report to your doctor or health care professional if they continue or are bothersome):  constipation  decreased appetite  diarrhea  headache  mouth sores  nausea, vomiting  tiredness This list may not describe all possible side effects. Call your doctor for medical advice about side effects. You may report side effects to FDA at 1-800-FDA-1088. Where should I keep my medicine? This drug is given in a hospital or clinic and will not be stored at home. NOTE: This sheet is a summary. It may not cover all  possible information. If you have questions about this medicine, talk to your doctor, pharmacist, or health care provider.  2020 [employer]vier/Gold Standard (2018-10-20 10:26:46)  

## 2020-03-14 ENCOUNTER — Telehealth: Payer: Self-pay | Admitting: *Deleted

## 2020-03-14 NOTE — Telephone Encounter (Signed)
Called pt and discussed side effects on cymbalta and protonix. All questions were answered, encouraged pt to use walker with ambulation due to having weakness. Pt thank me for the call.

## 2020-03-15 ENCOUNTER — Encounter: Payer: Self-pay | Admitting: Adult Health

## 2020-03-15 ENCOUNTER — Ambulatory Visit: Payer: Medicare HMO | Admitting: Adult Health

## 2020-03-15 VITALS — BP 130/88 | HR 86 | Ht 70.0 in | Wt 140.0 lb

## 2020-03-15 DIAGNOSIS — I639 Cerebral infarction, unspecified: Secondary | ICD-10-CM

## 2020-03-15 DIAGNOSIS — I10 Essential (primary) hypertension: Secondary | ICD-10-CM

## 2020-03-15 MED ORDER — ASPIRIN EC 81 MG PO TBEC
81.0000 mg | DELAYED_RELEASE_TABLET | Freq: Every day | ORAL | 11 refills | Status: DC
Start: 2020-03-15 — End: 2020-12-09

## 2020-03-15 NOTE — Progress Notes (Signed)
[facility] [address] [place]. [place] [address] [phone]       OFFICE FOLLOW UP NOTE  Christian. CHELSEA NUSZ Date of Birth:  1930-03-30 Medical Record Number:  [id number]   Referring MD: Rosalin Hawking  Reason for Referral: Stroke  Chief Complaint  Patient presents with  . Cerebrovascular Accident    stable. no concerns      HPI:   Today, 03/15/2020, Christian Burns returns for stroke follow-up accompanied by his grandson.  He has been stable since prior visit without new or reoccurring stroke/TIA symptoms.  Continues to reside at [facility].  No currently on aspirin for unknown reasons - was taking during prior visit. Blood pressure today 130/88.  Loop recorder has not shown atrial fibrillation thus far.  No concerns at this time.     History provided for reference purposes only Update 09/08/2019 JM: Christian Burns is a 86 year old male who is being seen today for stroke follow-up.  He has been doing well since prior visit without new or reoccurring stroke/TIA symptoms.  He continues to live at [facility] with his wife. Continues on aspirin 81 mg daily without bleeding or bruising.  Blood pressure today 144/62.  Loop recorder is not shown atrial fibrillation thus far. He continues to follow with oncology for multiple myeloma.  No further concerns at this time.  Initial visit 06/01/2019 PS: Christian Burns is a 86 year old pleasant Caucasian male seen today for initial office consultation visit following hospital admission for stroke.  History is obtained from the patient, review of electronic medical records and I personally reviewed imaging films and PACS.  He is a 86 year old pleasant Caucasian male with past medical history of multiple myeloma status post radiation and currently on chemotherapy, history of hypertension, radiation therapy, chronic kidney disease.  He developed sudden onset of difficulty speaking as noted by his wife.  Patient states he wanted to speak with  words did not come out.  EMS noticed right facial droop.  In route to the hospital his speech improved.  He had no prior history of strokes.  His NIH stroke scale was 3 on admission with modified Rankin at baseline of 1.  Patient was administered IV TPA uneventfully.  He was monitored in the neuro ICU closely with strict blood pressure control and did well.  CT scan of the head on admission showed age-indeterminate right internal capsule lacunar and subinsular infarct and changes of small vessel disease.  MRI scan subsequently confirmed a left frontal cortical tiny acute infarct.  MRI of the brain showed no large vessel stenosis or occlusion.  Carotid ultrasound showed no significant extracranial stenosis.  Patient had loop recorder inserted by Dr. Lovena Le on 04/21/2019 and so for paroxysmal A. fib has not yet been found.  LDL cholesterol was 71 mg percent.  Hemoglobin A1c was 5.7.  Patient was on no antithrombotics prior to admission and was started on dual antiplatelet therapy aspirin 81 and Plavix 75 mg daily which she is tolerating well but does complain of increased bruising though no major bleeding.  He was discharged home with home physical occupational and speech therapy and return to assisted living facility where he lives with his wife.  Patient states is done well.  He has had no recurrent symptoms.  His blood pressure is usually better controlled though it is elevated in office today at 157/81.  Patient has no new complaints today.  He states he is almost back to his baseline.  He denies any  prior history of strokes TIAs or significant neurological problems.  Last loop recorder interrogation on 05/30/2019 showed no evidence of atrial fibrillation     ROS:   14 system review of systems is positive for no complaints all other systems negative  PMH:  Past Medical History:  Diagnosis Date  . Anemia   . Blood transfusion without reported diagnosis   . CKD (chronic kidney disease) 07/06/2018   CKD  stage III  . Goals of care, counseling/discussion 02/04/2019  . History of radiation therapy 06/17/13-07/06/13   35 gray to upper lumbar spine  . 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 Hypertension   . Inguinal hernia   . Multiple myeloma [facility]) Sept 2010  . Multiple myeloma in relapse ([place]) 04/05/2016  . Radiation 09/26/15-10/20/15   lower thoracic spine, upper lumbar spine 30 gray  . Stroke [facility])     Social History:  Social History   Socioeconomic History  . Marital status: Married    Spouse name: Romie Minus  . Number of children: Not on file  . Years of education: Not on file  . Highest education level: Not on file  Occupational History  . Occupation: retired  Tobacco Use  . Smoking status: Never Smoker  . Smokeless tobacco: Never Used  . Tobacco comment: never used tobacco  Vaping Use  . Vaping Use: Never used  Substance and Sexual Activity  . Alcohol use: Yes    Alcohol/week: 4.0 standard drinks    Types: 4 Glasses of wine per week  . Drug use: No  . Sexual activity: Not on file  Other Topics Concern  . Not on file  Social History Narrative   Christian Kimmey is retired and lives at [employer] at [address] retirement apartment with his wife, Romie Minus. They have assist from their son, [name] and grandson, [name]. He is independent/assist with his care needs His son and/or grandson assists with transportation to medical appointments   Social Determinants of Health   Financial Resource Strain:   . Difficulty of Paying Living Expenses:   Food Insecurity:   . Worried About Charity fundraiser in the Last Year:   . Arboriculturist in the Last Year:   Transportation Needs:   . Film/video editor (Medical):   Marland Kitchen Lack of Transportation (Non-Medical):   Physical Activity:   . Days of Exercise per Week:   . Minutes of Exercise per Session:   Stress:   . Feeling of Stress :   Social Connections:   . Frequency of Communication with Friends and Family:   . Frequency of Social  Gatherings with Friends and Family:   . Attends Religious Services:   . Active Member of Clubs or Organizations:   . Attends Archivist Meetings:   Marland Kitchen Marital Status:   Intimate Partner Violence:   . Fear of Current or Ex-Partner:   . Emotionally Abused:   Marland Kitchen Physically Abused:   . Sexually Abused:     Medications:   Current Outpatient Medications on File Prior to Visit  Medication Sig Dispense Refill  . amLODipine-benazepril (LOTREL) 5-10 MG capsule TAKE ONE CAPSULE EACH DAY 30 capsule 2  . dronabinol (MARINOL) 5 MG capsule TAKE ONE CAPSULE TWICE A DAY BEFORE A MEAL 60 capsule 0  . DULoxetine (CYMBALTA) 60 MG capsule Take 1 capsule (60 mg total) by mouth daily. 30 capsule 3  . finasteride (PROSCAR) 5 MG tablet TAKE ONE TABLET EACH DAY (Patient taking differently: Take 5  mg by mouth daily. ) 30 tablet 6  . lidocaine-prilocaine (EMLA) cream Apply 1 application topically as needed. 30 g 0  . metoprolol succinate (TOPROL-XL) 25 MG 24 hr tablet Take 25 mg by mouth daily.    . Multiple Vitamins-Minerals (QC MULTI-VITE 50 & OVER PO) TAKE ONE TABLET DAILY    . ondansetron (ZOFRAN) 4 MG tablet Take 1 tablet (4 mg total) by mouth every 6 (six) hours as needed for nausea. 20 tablet 0  . pantoprazole (PROTONIX) 40 MG tablet Take 1 tablet (40 mg total) by mouth 2 (two) times daily. 60 tablet 3  . penicillin v potassium (VEETID) 500 MG tablet Take 500 mg by mouth 4 (four) times daily.    . polyvinyl alcohol (LIQUIFILM TEARS) 1.4 % ophthalmic solution Place 1 drop into both eyes as needed for dry eyes. 15 mL 0  . predniSONE (DELTASONE) 20 MG tablet TAKE ONE TABLET EACH DAY WITH BREAKFAST 30 tablet 2  . Probiotic Product (PROBIOTIC PO) Take 1 capsule by mouth daily.    Marland Kitchen pyridOXINE (VITAMIN B-6) 100 MG tablet Take 100 mg by mouth daily.     . traMADol (ULTRAM) 50 MG tablet Take 1 tablet (50 mg total) by mouth every 8 (eight) hours as needed for moderate pain. 10 tablet 0  . vitamin B-12  (CYANOCOBALAMIN) 500 MCG tablet Take 500 mcg by mouth daily.    . Vitamin D, Ergocalciferol, (DRISDOL) 50000 UNITS CAPS capsule Take 50,000 Units by mouth every Sunday.      No current facility-administered medications on file prior to visit.    Allergies:   Allergies  Allergen Reactions  . Sulfa Antibiotics Itching  . Sulfasalazine Itching    Physical Exam  Today's Vitals   03/15/20 1051 03/15/20 1119  BP: (!) 160/84 130/88  Pulse: 86   Weight: 140 lb (63.5 kg)   Height: _0  (1.778 m)    Body mass index is 20.09 kg/m.   General: Frail pleasant elderly Caucasian male seated, in no evident distress Head: head normocephalic and atraumatic.   Neck: supple with no carotid or supraclavicular bruits Cardiovascular: regular rate and rhythm, no murmurs Musculoskeletal: no deformity Skin:  no ras but scattered petichiae Vascular:  Normal pulses all extremities  Neurologic Exam Mental Status: Awake and fully alert.  Normal speech and language.  Oriented to place and time. Recent and remote memory intact. Attention span, concentration and fund of knowledge appropriate. Mood and affect appropriate.  Cranial Nerves: Pupils equal, briskly reactive to light. Extraocular movements full without nystagmus. Visual fields full to confrontation. Hearing diminished in the right ear t. Facial sensation intact. Face, tongue, palate moves normally and symmetrically.  Motor: Normal bulk and tone. Normal strength in all tested extremity muscles. Sensory.: intact to touch , pinprick , position and vibratory sensation.  Coordination: Rapid alternating movements normal in all extremities. Finger-to-nose and heel-to-shin performed accurately bilaterally. Gait and Station: Arises from chair without difficulty. Stance is normal. Gait demonstrates slight favoring of the right leg but normal stride length and balance . Able to heel, toe and tandem walk with moderate difficulty.  Reflexes: 1+ and symmetric.  Toes downgoing.     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  Loop recorder placed which has not shown atrial fibrillation thus far.  Vascular risk factors of age and borderline hyperlipidemia only.  PLAN: -restart aspirin 81 mg daily for secondary stroke prevention  -Continue to follow with PCP for HTN monitoring and management -Continue to follow with oncology for management of multiple myeloma -Continue to stay active and maintain a healthy diet -Loop recorder will continue to be monitored -maintain strict control of hypertension with blood pressure goal below 130/90, diabetes with hemoglobin A1c goal below 6.5% and lipids with LDL cholesterol goal below 70 mg/dL. I also advised the patient to eat a healthy diet with plenty of whole grains, cereals, fruits and vegetables, exercise regularly and maintain ideal body weight.    Overall stable from stroke standpoint and recommend f/u on a as needed basis    I spent 20 minutes of face-to-face and non-face-to-face time with patient.  This included previsit chart review, lab review, study review, order entry, electronic health record documentation, patient education regarding history of prior stroke, monitoring of loop recorder, importance of managing stroke risk factors and answered all questions to patient satisfaction    Frann Rider, [facility]  [facility] [address] [place], [place] [address]  Phone [phone] Fax [phone] Note: This document was prepared with digital dictation and possible smart phrase technology. Any transcriptional errors that result from this process are unintentional.

## 2020-03-15 NOTE — Patient Instructions (Signed)
Recommend restarting aspirin 81 mg daily for secondary stroke prevention  Loop recorder will continue to be monitored for possible atrial fibrillation  Continue to follow up with PCP regarding blood pressure management  Maintain strict control of hypertension with blood pressure goal below 130/90, diabetes with hemoglobin A1c goal below 6.5% and cholesterol with LDL cholesterol (bad cholesterol) goal below 70 mg/dL.      Overall stable from stroke standpoint and recommend follow-up on an as-needed basis       Thank you for coming to see [place] at [facility]. I hope we have been able to provide you high quality care today.  You may receive a patient satisfaction survey over the next few weeks. We would appreciate your feedback and comments so that we may continue to improve ourselves and the health of our patients.

## 2020-03-17 ENCOUNTER — Ambulatory Visit: Payer: Medicare HMO | Admitting: Nurse Practitioner

## 2020-03-20 ENCOUNTER — Ambulatory Visit (INDEPENDENT_AMBULATORY_CARE_PROVIDER_SITE_OTHER): Payer: Medicare HMO | Admitting: *Deleted

## 2020-03-20 DIAGNOSIS — I63412 Cerebral infarction due to embolism of left middle cerebral artery: Secondary | ICD-10-CM | POA: Diagnosis not present

## 2020-03-20 LAB — CUP PACEART REMOTE DEVICE CHECK
Date Time Interrogation Session: 20210805230037
Implantable Pulse Generator Implant Date: 20200909

## 2020-03-21 NOTE — Progress Notes (Signed)
Carelink Summary Report / Loop Recorder 

## 2020-03-27 ENCOUNTER — Other Ambulatory Visit: Payer: Self-pay | Admitting: Hematology & Oncology

## 2020-03-27 DIAGNOSIS — R634 Abnormal weight loss: Secondary | ICD-10-CM

## 2020-03-27 DIAGNOSIS — R112 Nausea with vomiting, unspecified: Secondary | ICD-10-CM

## 2020-03-27 DIAGNOSIS — C9002 Multiple myeloma in relapse: Secondary | ICD-10-CM

## 2020-03-29 ENCOUNTER — Telehealth: Payer: Self-pay | Admitting: *Deleted

## 2020-03-29 NOTE — Telephone Encounter (Signed)
Per Dr. Marin Olp, patient may get the third booster injection. Patient verbalized understanding.

## 2020-04-06 ENCOUNTER — Inpatient Hospital Stay: Payer: Medicare HMO

## 2020-04-06 ENCOUNTER — Other Ambulatory Visit: Payer: Self-pay

## 2020-04-06 ENCOUNTER — Inpatient Hospital Stay (HOSPITAL_BASED_OUTPATIENT_CLINIC_OR_DEPARTMENT_OTHER): Payer: Medicare HMO | Admitting: Hematology & Oncology

## 2020-04-06 ENCOUNTER — Other Ambulatory Visit: Payer: Self-pay | Admitting: *Deleted

## 2020-04-06 ENCOUNTER — Encounter: Payer: Self-pay | Admitting: Hematology & Oncology

## 2020-04-06 ENCOUNTER — Inpatient Hospital Stay: Payer: Medicare HMO | Attending: Hematology & Oncology

## 2020-04-06 VITALS — BP 166/86 | HR 94 | Resp 17

## 2020-04-06 VITALS — BP 178/82 | HR 65 | Temp 99.4°F | Resp 17 | Wt 138.0 lb

## 2020-04-06 DIAGNOSIS — G629 Polyneuropathy, unspecified: Secondary | ICD-10-CM | POA: Diagnosis not present

## 2020-04-06 DIAGNOSIS — I10 Essential (primary) hypertension: Secondary | ICD-10-CM | POA: Insufficient documentation

## 2020-04-06 DIAGNOSIS — C9000 Multiple myeloma not having achieved remission: Secondary | ICD-10-CM

## 2020-04-06 DIAGNOSIS — C9002 Multiple myeloma in relapse: Secondary | ICD-10-CM | POA: Insufficient documentation

## 2020-04-06 DIAGNOSIS — Z79899 Other long term (current) drug therapy: Secondary | ICD-10-CM | POA: Insufficient documentation

## 2020-04-06 DIAGNOSIS — D6481 Anemia due to antineoplastic chemotherapy: Secondary | ICD-10-CM | POA: Insufficient documentation

## 2020-04-06 DIAGNOSIS — D649 Anemia, unspecified: Secondary | ICD-10-CM

## 2020-04-06 DIAGNOSIS — Z5111 Encounter for antineoplastic chemotherapy: Secondary | ICD-10-CM | POA: Diagnosis present

## 2020-04-06 DIAGNOSIS — R002 Palpitations: Secondary | ICD-10-CM | POA: Diagnosis not present

## 2020-04-06 DIAGNOSIS — R5383 Other fatigue: Secondary | ICD-10-CM | POA: Insufficient documentation

## 2020-04-06 DIAGNOSIS — R0602 Shortness of breath: Secondary | ICD-10-CM | POA: Diagnosis not present

## 2020-04-06 DIAGNOSIS — M2550 Pain in unspecified joint: Secondary | ICD-10-CM | POA: Diagnosis not present

## 2020-04-06 DIAGNOSIS — D508 Other iron deficiency anemias: Secondary | ICD-10-CM

## 2020-04-06 LAB — CMP (CANCER CENTER ONLY)
ALT: 13 U/L (ref 0–44)
AST: 24 U/L (ref 15–41)
Albumin: 3.3 g/dL — ABNORMAL LOW (ref 3.5–5.0)
Alkaline Phosphatase: 53 U/L (ref 38–126)
Anion gap: 9 (ref 5–15)
BUN: 55 mg/dL — ABNORMAL HIGH (ref 8–23)
CO2: 27 mmol/L (ref 22–32)
Calcium: 9.2 mg/dL (ref 8.9–10.3)
Chloride: 105 mmol/L (ref 98–111)
Creatinine: 1.56 mg/dL — ABNORMAL HIGH (ref 0.61–1.24)
GFR, Est AFR Am: 46 mL/min — ABNORMAL LOW (ref >60)
GFR, Estimated: 39 mL/min — ABNORMAL LOW (ref >60)
Glucose, Bld: 115 mg/dL — ABNORMAL HIGH (ref 70–99)
Potassium: 3.7 mmol/L (ref 3.5–5.1)
Sodium: 141 mmol/L (ref 135–145)
Total Bilirubin: 0.6 mg/dL (ref 0.3–1.2)
Total Protein: 5.4 g/dL — ABNORMAL LOW (ref 6.5–8.1)

## 2020-04-06 LAB — CBC WITH DIFFERENTIAL (CANCER CENTER ONLY)
Abs Immature Granulocytes: 0.47 10*3/uL — ABNORMAL HIGH (ref 0.00–0.07)
Basophils Absolute: 0 10*3/uL (ref 0.0–0.1)
Basophils Relative: 1 %
Eosinophils Absolute: 0.2 10*3/uL (ref 0.0–0.5)
Eosinophils Relative: 3 %
HCT: 27.5 % — ABNORMAL LOW (ref 39.0–52.0)
Hemoglobin: 8.8 g/dL — ABNORMAL LOW (ref 13.0–17.0)
Immature Granulocytes: 9 %
Lymphocytes Relative: 1 %
Lymphs Abs: 0.1 10*3/uL — ABNORMAL LOW (ref 0.7–4.0)
MCH: 29.5 pg (ref 26.0–34.0)
MCHC: 32 g/dL (ref 30.0–36.0)
MCV: 92.3 fL (ref 80.0–100.0)
Monocytes Absolute: 0.7 10*3/uL (ref 0.1–1.0)
Monocytes Relative: 13 %
Neutro Abs: 3.8 10*3/uL (ref 1.7–7.7)
Neutrophils Relative %: 73 %
Platelet Count: 127 10*3/uL — ABNORMAL LOW (ref 150–400)
RBC: 2.98 MIL/uL — ABNORMAL LOW (ref 4.22–5.81)
RDW: 15.6 % — ABNORMAL HIGH (ref 11.5–15.5)
WBC Count: 5.2 10*3/uL (ref 4.0–10.5)
nRBC: 0 % (ref 0.0–0.2)

## 2020-04-06 LAB — RETICULOCYTES
Immature Retic Fract: 9.4 % (ref 2.3–15.9)
RBC.: 2.99 MIL/uL — ABNORMAL LOW (ref 4.22–5.81)
Retic Count, Absolute: 31.4 10*3/uL (ref 19.0–186.0)
Retic Ct Pct: 1.1 % (ref 0.4–3.1)

## 2020-04-06 LAB — LACTATE DEHYDROGENASE: LDH: 526 U/L — ABNORMAL HIGH (ref 98–192)

## 2020-04-06 LAB — SAMPLE TO BLOOD BANK

## 2020-04-06 LAB — PREPARE RBC (CROSSMATCH)

## 2020-04-06 MED ORDER — SODIUM CHLORIDE 0.9 % IV SOLN
10.0000 mg | Freq: Once | INTRAVENOUS | Status: AC
  Administered 2020-04-06: 10 mg via INTRAVENOUS
  Filled 2020-04-06: qty 1

## 2020-04-06 MED ORDER — SODIUM CHLORIDE 0.9 % IV SOLN
Freq: Once | INTRAVENOUS | Status: AC
  Filled 2020-04-06: qty 250

## 2020-04-06 MED ORDER — SODIUM CHLORIDE 0.9% FLUSH
10.0000 mL | INTRAVENOUS | Status: DC | PRN
  Administered 2020-04-06: 10 mL
  Filled 2020-04-06: qty 10

## 2020-04-06 MED ORDER — PALONOSETRON HCL INJECTION 0.25 MG/5ML
INTRAVENOUS | Status: AC
  Filled 2020-04-06: qty 5

## 2020-04-06 MED ORDER — PALONOSETRON HCL INJECTION 0.25 MG/5ML
0.2500 mg | Freq: Once | INTRAVENOUS | Status: AC
  Administered 2020-04-06: 0.25 mg via INTRAVENOUS

## 2020-04-06 MED ORDER — HEPARIN SOD (PORK) LOCK FLUSH 100 UNIT/ML IV SOLN
500.0000 [IU] | Freq: Once | INTRAVENOUS | Status: AC | PRN
  Administered 2020-04-06: 500 [IU]
  Filled 2020-04-06: qty 5

## 2020-04-06 MED ORDER — SODIUM CHLORIDE 0.9 % IV SOLN
75.0000 mg/m2 | Freq: Once | INTRAVENOUS | Status: AC
  Administered 2020-04-06: 125 mg via INTRAVENOUS
  Filled 2020-04-06: qty 5

## 2020-04-06 MED ORDER — EPOETIN ALFA-EPBX 40000 UNIT/ML IJ SOLN
INTRAMUSCULAR | Status: AC
  Filled 2020-04-06: qty 1

## 2020-04-06 MED ORDER — EPOETIN ALFA-EPBX 40000 UNIT/ML IJ SOLN
40000.0000 [IU] | Freq: Once | INTRAMUSCULAR | Status: AC
  Administered 2020-04-06: 40000 [IU] via SUBCUTANEOUS

## 2020-04-06 NOTE — Progress Notes (Signed)
Hematology and Oncology Follow Up Visit  Christian Burns [id number] 1929-11-06 86 y.o. 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 Palliative radiation therapy to right ilium Kyprolis/Cytoxanq 3 week dosing- s/p cycle 24 (Cytoxan restarted on cycle 13) - DC'd due to progression Radiation therapy to right femur, 10 fraction --completed 3000 rad on 11/06/2018 Elotuzomab - started 10/29/2018 -- s/p cycle #1 -- d/c on 12/24/2018 Pomalidomide 2 mg po q day (21 on/7 off) - started 10/29/2018 -- d/c 12/31/2018 Daratumumab - start cycle # 1 on 12/31/2018 -- d/c on -02/04/2019  Current Therapy: Bendamustine/Decadron -- started on 02/10/2019, s/p cycle10 Zometa IV q 4 weeks - on hold Retacrit needed for hemoglobin less than 10 IVIG 40g IV q month -- start on 07/2019   Interim History:  Christian Burns is here today for follow-up and treatment.  He actually is doing okay.  Unfortunately, we just would not be able to give him Retacrit because his blood pressure is on the high side.  He does feel little bit weak in his legs.  His hemoglobin is 8.8.  This is on the low side for him.  As such, we will give him a transfusion.  The the last time we gave him a transfusion he felt a whole lot better.  His myeloma studies have been doing quite nicely.  His last lambda light chain was 2.3 mg/dL.  His IgG level is on the low side.  As such, we do give him IVIG as he has had pneumonia a couple times.  He has had no problems with bowels or bladder.  Does have the neuropathy in his lower legs.  He has not yet really taken the Cymbalta.  I encouraged him to take Cymbalta.  His appetite seems to be holding its own.  He is on  Marinol.  I am unsure how often he takes this.  He has had no bleeding.  He has had no fever.  He still is doing his best job and try to take care of his wife.  Overall, I would say his performance status is ECOG 2.    Medications:  Allergies as of 04/06/2020      Reactions   Sulfa Antibiotics Itching   Sulfasalazine Itching      Medication List       Accurate as of April 06, 2020  1:02 PM. If you have any questions, ask your nurse or doctor.        amLODipine-benazepril 5-10 MG capsule Commonly known as: LOTREL TAKE ONE CAPSULE EACH DAY   aspirin EC 81 MG tablet Take 1 tablet (81 mg total) by mouth daily. Swallow whole.   dronabinol 5 MG capsule Commonly known as: MARINOL TAKE ONE CAPSULE TWICE A DAY BEFORE A MEAL   DULoxetine 60 MG capsule Commonly known as: CYMBALTA Take 1 capsule (60 mg total) by mouth daily.   finasteride 5 MG tablet Commonly known as: PROSCAR TAKE ONE TABLET EACH DAY What changed: See the new instructions.   lidocaine-prilocaine cream Commonly known as: EMLA Apply 1 application topically as needed.   metoprolol succinate 25 MG 24 hr tablet Commonly known as: TOPROL-XL Take 25 mg by mouth daily.   ondansetron 4 MG tablet Commonly known as: ZOFRAN Take  1 tablet (4 mg total) by mouth every 6 (six) hours as needed for nausea.   pantoprazole 40 MG tablet Commonly known as: Protonix Take 1 tablet (40 mg total) by mouth 2 (two) times daily.   penicillin v potassium 500 MG tablet Commonly known as: VEETID Take 500 mg by mouth 4 (four) times daily.   polyvinyl alcohol 1.4 % ophthalmic solution Commonly known as: LIQUIFILM TEARS Place 1 drop into both eyes as needed for dry eyes.   predniSONE 20 MG tablet Commonly known as: DELTASONE TAKE ONE TABLET EACH DAY WITH BREAKFAST   PROBIOTIC PO Take 1 capsule by mouth daily.   pyridOXINE 100 MG tablet Commonly known as: VITAMIN B-6 Take 100 mg by mouth daily.   QC MULTI-VITE 50 & OVER  PO TAKE ONE TABLET DAILY   traMADol 50 MG tablet Commonly known as: ULTRAM Take 1 tablet (50 mg total) by mouth every 8 (eight) hours as needed for moderate pain.   vitamin B-12 500 MCG tablet Commonly known as: CYANOCOBALAMIN Take 500 mcg by mouth daily.   Vitamin D (Ergocalciferol) 1.25 MG (50000 UNIT) Caps capsule Commonly known as: DRISDOL Take 50,000 Units by mouth every Sunday.       Allergies:  Allergies  Allergen Reactions  . Sulfa Antibiotics Itching  . Sulfasalazine Itching    Past Medical History, Surgical history, Social history, and Family History were reviewed and updated.  Review of Systems: Review of Systems  Constitutional: Positive for malaise/fatigue.  HENT: Negative.   Eyes: Negative.   Respiratory: Positive for shortness of breath.   Cardiovascular: Positive for palpitations.  Gastrointestinal: Negative.   Genitourinary: Negative.   Musculoskeletal: Positive for joint pain.  Skin: Negative.   Neurological: Positive for sensory change.  Endo/Heme/Allergies: Negative.   Psychiatric/Behavioral: Negative.      Physical Exam:  weight is 138 lb (62.6 kg). His temperature is 99.4 F (37.4 C). His blood pressure is 178/82 (abnormal) and his pulse is 65. His respiration is 17 and oxygen saturation is 95%.   Wt Readings from Last 3 Encounters:  04/06/20 138 lb (62.6 kg)  03/15/20 140 lb (63.5 kg)  03/08/20 136 lb (61.7 kg)    Physical Exam Vitals reviewed.  HENT:     Head: Normocephalic and atraumatic.  Eyes:     Pupils: Pupils are equal, round, and reactive to light.  Cardiovascular:     Rate and Rhythm: Normal rate and regular rhythm.     Heart sounds: Normal heart sounds.  Pulmonary:     Effort: Pulmonary effort is normal.     Breath sounds: Normal breath sounds.  Abdominal:     General: Bowel sounds are normal.     Palpations: Abdomen is soft.  Musculoskeletal:        General: No tenderness or deformity. Normal range of motion.      Cervical back: Normal range of motion.  Lymphadenopathy:     Cervical: No cervical adenopathy.  Skin:    General: Skin is warm and dry.     Findings: No erythema or rash.  Neurological:     Mental Status: He is alert and oriented to person, place, and time.  Psychiatric:        Behavior: Behavior normal.        Thought Content: Thought content normal.        Judgment: Judgment normal.      Lab Results  Component Value Date   WBC 5.2 04/06/2020   HGB 8.8 (L)  04/06/2020   HCT 27.5 (L) 04/06/2020   MCV 92.3 04/06/2020   PLT 127 (L) 04/06/2020   Lab Results  Component Value Date   FERRITIN 455 (H) 03/08/2020   IRON 48 03/08/2020   TIBC 209 03/08/2020   UIBC 161 03/08/2020   IRONPCTSAT 23 03/08/2020   Lab Results  Component Value Date   RETICCTPCT 1.1 04/06/2020   RBC 2.99 (L) 04/06/2020   Lab Results  Component Value Date   KPAFRELGTCHN 6.0 03/08/2020   LAMBDASER 23.4 03/08/2020   KAPLAMBRATIO 0.26 03/08/2020   Lab Results  Component Value Date   IGGSERUM 213 (L) 03/08/2020   IGA <5 (L) 03/08/2020   IGMSERUM 10 (L) 03/08/2020   Lab Results  Component Value Date   TOTALPROTELP 4.6 (L) 03/08/2020   ALBUMINELP 2.5 (L) 03/08/2020   A1GS 0.3 03/08/2020   A2GS 0.8 03/08/2020   BETS 0.7 03/08/2020   BETA2SER 0.3 07/28/2015   GAMS 0.2 (L) 03/08/2020   MSPIKE 0.1 (H) 03/08/2020   SPEI Comment 03/08/2020     Chemistry      Component Value Date/Time   NA 141 04/06/2020 1228   NA 146 (H) 08/07/2017 1153   NA 141 01/09/2017 1004   K 3.7 04/06/2020 1228   K 4.6 08/07/2017 1153   K 4.4 01/09/2017 1004   CL 105 04/06/2020 1228   CL 108 08/07/2017 1153   CO2 27 04/06/2020 1228   CO2 26 08/07/2017 1153   CO2 22 01/09/2017 1004   BUN 55 (H) 04/06/2020 1228   BUN 24 (H) 08/07/2017 1153   BUN 23.9 01/09/2017 1004   CREATININE 1.56 (H) 04/06/2020 1228   CREATININE 1.56 (H) 05/26/2018 1508   CREATININE 1.5 (H) 01/09/2017 1004      Component Value Date/Time    CALCIUM 9.2 04/06/2020 1228   CALCIUM 8.8 08/07/2017 1153   CALCIUM 8.8 01/09/2017 1004   ALKPHOS 53 04/06/2020 1228   ALKPHOS 97 (H) 08/07/2017 1153   ALKPHOS 80 01/09/2017 1004   AST 24 04/06/2020 1228   AST 18 01/09/2017 1004   ALT 13 04/06/2020 1228   ALT 20 08/07/2017 1153   ALT 12 01/09/2017 1004   BILITOT 0.6 04/06/2020 1228   BILITOT 0.92 01/09/2017 1004       Impression and Plan: Christian Burns is a very pleasant 86yo caucasian gentleman with relapsed IgG lambda myeloma. We really follow his lambda light chain as a measure of response.  We will have to give him a transfusion of packed red blood cells.  I do think that his blood pressure is too high for [place] to give Retacrit.  Hopefully, the Cymbalta will help with the neuropathy in his legs.  We clearly are just focusing on his quality of life.  He really wants to try to be around as long as possible to help his wife.  He will go ahead and get the bendamustine today and tomorrow.  He will get a transfusion tomorrow.  He gets his IVIG today.  We will plan to get him back in another month.   [name], MD 8/26/20211:02 PM

## 2020-04-06 NOTE — Progress Notes (Signed)
Reviewed pt labs with Dr. Marin Olp and pt ok to treat with labs from 04/06/2020.

## 2020-04-06 NOTE — Patient Instructions (Signed)
[facility] Discharge Instructions for Patients Receiving Chemotherapy  Today you received the following chemotherapy agents [place].   To help prevent nausea and vomiting after your treatment, we encourage you to take your nausea medication as directed.    If you develop nausea and vomiting that is not controlled by your nausea medication, call the clinic.   BELOW ARE SYMPTOMS THAT SHOULD BE REPORTED IMMEDIATELY:  *FEVER GREATER THAN 100.5 F  *CHILLS WITH OR WITHOUT FEVER  NAUSEA AND VOMITING THAT IS NOT CONTROLLED WITH YOUR NAUSEA MEDICATION  *UNUSUAL SHORTNESS OF BREATH  *UNUSUAL BRUISING OR BLEEDING  TENDERNESS IN MOUTH AND THROAT WITH OR WITHOUT PRESENCE OF ULCERS  *URINARY PROBLEMS  *BOWEL PROBLEMS  UNUSUAL RASH Items with * indicate a potential emergency and should be followed up as soon as possible.  Feel free to call the clinic you have any questions or concerns. The clinic phone number is [phone].  Please show the CHEMO ALERT CARD at check-in to the Emergency Department and triage nurse.   

## 2020-04-06 NOTE — Patient Instructions (Signed)

## 2020-04-07 ENCOUNTER — Inpatient Hospital Stay: Payer: Medicare HMO

## 2020-04-07 ENCOUNTER — Ambulatory Visit: Payer: Medicare HMO | Admitting: Gastroenterology

## 2020-04-07 VITALS — BP 145/79 | HR 72 | Temp 98.1°F | Resp 18

## 2020-04-07 DIAGNOSIS — Z5111 Encounter for antineoplastic chemotherapy: Secondary | ICD-10-CM | POA: Diagnosis not present

## 2020-04-07 DIAGNOSIS — C9002 Multiple myeloma in relapse: Secondary | ICD-10-CM

## 2020-04-07 DIAGNOSIS — C9000 Multiple myeloma not having achieved remission: Secondary | ICD-10-CM

## 2020-04-07 LAB — IGG, IGA, IGM
IgA: <5 mg/dL — ABNORMAL LOW (ref 61–437)
IgG (Immunoglobin G), Serum: 210 mg/dL — ABNORMAL LOW (ref 603–1613)
IgM (Immunoglobulin M), Srm: 27 mg/dL (ref 15–143)

## 2020-04-07 LAB — IRON AND TIBC
Iron: 58 ug/dL (ref 42–163)
Saturation Ratios: 26 % (ref 20–55)
TIBC: 225 ug/dL (ref 202–409)
UIBC: 167 ug/dL (ref 117–376)

## 2020-04-07 LAB — PROTEIN ELECTROPHORESIS, SERUM
A/G Ratio: 1.1 (ref 0.7–1.7)
Albumin ELP: 2.6 g/dL — ABNORMAL LOW (ref 2.9–4.4)
Alpha-1-Globulin: 0.3 g/dL (ref 0.0–0.4)
Alpha-2-Globulin: 1 g/dL (ref 0.4–1.0)
Beta Globulin: 0.7 g/dL (ref 0.7–1.3)
Gamma Globulin: 0.3 g/dL — ABNORMAL LOW (ref 0.4–1.8)
Globulin, Total: 2.4 g/dL (ref 2.2–3.9)
M-Spike, %: 0.1 g/dL — ABNORMAL HIGH
Total Protein ELP: 5 g/dL — ABNORMAL LOW (ref 6.0–8.5)

## 2020-04-07 LAB — KAPPA/LAMBDA LIGHT CHAINS
Kappa free light chain: 1.9 mg/L — ABNORMAL LOW (ref 3.3–19.4)
Kappa, lambda light chain ratio: 0.1 — ABNORMAL LOW (ref 0.26–1.65)
Lambda free light chains: 18.5 mg/L (ref 5.7–26.3)

## 2020-04-07 LAB — FERRITIN: Ferritin: 501 ng/mL — ABNORMAL HIGH (ref 24–336)

## 2020-04-07 MED ORDER — SODIUM CHLORIDE 0.9 % IV SOLN
10.0000 mg | Freq: Once | INTRAVENOUS | Status: AC
  Administered 2020-04-07: 10 mg via INTRAVENOUS
  Filled 2020-04-07: qty 10

## 2020-04-07 MED ORDER — SODIUM CHLORIDE 0.9 % IV SOLN
75.0000 mg/m2 | Freq: Once | INTRAVENOUS | Status: AC
  Administered 2020-04-07: 125 mg via INTRAVENOUS
  Filled 2020-04-07: qty 5

## 2020-04-07 MED ORDER — HEPARIN SOD (PORK) LOCK FLUSH 100 UNIT/ML IV SOLN
500.0000 [IU] | Freq: Once | INTRAVENOUS | Status: AC | PRN
  Administered 2020-04-07: 500 [IU]
  Filled 2020-04-07: qty 5

## 2020-04-07 MED ORDER — SODIUM CHLORIDE 0.9 % IV SOLN
Freq: Once | INTRAVENOUS | Status: AC
  Filled 2020-04-07: qty 250

## 2020-04-07 MED ORDER — SODIUM CHLORIDE 0.9% FLUSH
10.0000 mL | INTRAVENOUS | Status: DC | PRN
  Administered 2020-04-07: 10 mL
  Filled 2020-04-07: qty 10

## 2020-04-07 NOTE — Patient Instructions (Signed)
https://www.redcrossblood.org/donate-blood/blood-donation-process/what-happens-to-donated-blood/blood-transfusions/types-of-blood-transfusions.html"> https://www.hematology.org/education/patients/blood-basics/blood-safety-and-matching"> https://www.nhlbi.nih.gov/health-topics/blood-transfusion">  Blood Transfusion, Adult A blood transfusion is a procedure in which you receive blood or a type of blood cell (blood component) through an IV. You may need a blood transfusion when your blood level is low. This may result from a bleeding disorder, illness, injury, or surgery. The blood may come from a donor. You may also be able to donate blood for yourself (autologous blood donation) before a planned surgery. The blood given in a transfusion is made up of different blood components. You may receive:  Red blood cells. These carry oxygen to the cells in the body.  Platelets. These help your blood to clot.  Plasma. This is the liquid part of your blood. It carries proteins and other substances throughout the body.  White blood cells. These help you fight infections. If you have hemophilia or another clotting disorder, you may also receive other types of blood products. Tell a health care provider about:  Any blood disorders you have.  Any previous reactions you have had during a blood transfusion.  Any allergies you have.  All medicines you are taking, including vitamins, herbs, eye drops, creams, and over-the-counter medicines.  Any surgeries you have had.  Any medical conditions you have, including any recent fever or cold symptoms.  Whether you are pregnant or may be pregnant. What are the risks? Generally, this is a safe procedure. However, problems may occur.  The most common problems include: ? A mild allergic reaction, such as red, swollen areas of skin (hives) and itching. ? Fever or chills. This may be the body's response to new blood cells received. This may occur during or up to 4  hours after the transfusion.  More serious problems may include: ? Transfusion-associated circulatory overload (TACO), or too much fluid in the lungs. This may cause breathing problems. ? A serious allergic reaction, such as difficulty breathing or swelling around the face and lips. ? Transfusion-related acute lung injury (TRALI), which causes breathing difficulty and low oxygen in the blood. This can occur within hours of the transfusion or several days later. ? Iron overload. This can happen after receiving many blood transfusions over a period of time. ? Infection or virus being transmitted. This is rare because donated blood is carefully tested before it is given. ? Hemolytic transfusion reaction. This is rare. It happens when your body's defense system (immune system)tries to attack the new blood cells. Symptoms may include fever, chills, nausea, low blood pressure, and low back or chest pain. ? Transfusion-associated graft-versus-host disease (TAGVHD). This is rare. It happens when donated cells attack your body's healthy tissues. What happens before the procedure? Medicines Ask your health care provider about:  Changing or stopping your regular medicines. This is especially important if you are taking diabetes medicines or blood thinners.  Taking medicines such as aspirin and ibuprofen. These medicines can thin your blood. Do not take these medicines unless your health care provider tells you to take them.  Taking over-the-counter medicines, vitamins, herbs, and supplements. General instructions  Follow instructions from your health care provider about eating and drinking restrictions.  You will have a blood test to determine your blood type. This is necessary to know what kind of blood your body will accept and to match it to the donor blood.  If you are going to have a planned surgery, you may be able to do an autologous blood donation. This may be done in case you need to have a  transfusion.    You will have your temperature, blood pressure, and pulse monitored before the transfusion.  If you have had an allergic reaction to a transfusion in the past, you may be given medicine to help prevent a reaction. This medicine may be given to you by mouth (orally) or through an IV.  Set aside time for the blood transfusion. This procedure generally takes 1-4 hours to complete. What happens during the procedure?   An IV will be inserted into one of your veins.  The bag of donated blood will be attached to your IV. The blood will then enter through your vein.  Your temperature, blood pressure, and pulse will be monitored regularly during the transfusion. This monitoring is done to detect early signs of a transfusion reaction.  Tell your nurse right away if you have any of these symptoms during the transfusion: ? Shortness of breath or trouble breathing. ? Chest or back pain. ? Fever or chills. ? Hives or itching.  If you have any signs or symptoms of a reaction, your transfusion will be stopped and you may be given medicine.  When the transfusion is complete, your IV will be removed.  Pressure may be applied to the IV site for a few minutes.  A bandage (dressing)will be applied. The procedure may vary among health care providers and hospitals. What happens after the procedure?  Your temperature, blood pressure, pulse, breathing rate, and blood oxygen level will be monitored until you leave the hospital or clinic.  Your blood may be tested to see how you are responding to the transfusion.  You may be warmed with fluids or blankets to maintain a normal body temperature.  If you receive your blood transfusion in an outpatient setting, you will be told whom to contact to report any reactions. Where to find more information For more information on blood transfusions, visit the American Red Cross: redcross.org Summary  A blood transfusion is a procedure in which you  receive blood or a type of blood cell (blood component) through an IV.  The blood you receive may come from a donor or be donated by yourself (autologous blood donation) before a planned surgery.  The blood given in a transfusion is made up of different blood components. You may receive red blood cells, platelets, plasma, or white blood cells depending on the condition treated.  Your temperature, blood pressure, and pulse will be monitored before, during, and after the transfusion.  After the transfusion, your blood may be tested to see how your body has responded. This information is not intended to replace advice given to you by your health care provider. Make sure you discuss any questions you have with your health care provider. Document Revised: 01/21/2019 Document Reviewed: 01/21/2019 Elsevier Patient Education  [place]. Bendamustine Injection What is this medicine? BENDAMUSTINE (BEN da MUS teen) is a chemotherapy drug. It is used to treat chronic lymphocytic leukemia and non-Hodgkin lymphoma. This medicine may be used for other purposes; ask your health care provider or pharmacist if you have questions. COMMON BRAND NAME(S): Kristine Royal, Treanda What should I tell my health care provider before I take this medicine? They need to know if you have any of these conditions:  infection (especially a virus infection such as chickenpox, cold sores, or herpes)  kidney disease  liver disease  an unusual or allergic reaction to bendamustine, mannitol, other medicines, foods, dyes, or preservatives  pregnant or trying to get pregnant  breast-feeding How should I use this medicine?  This medicine is for infusion into a vein. It is given by a health care professional in a hospital or clinic setting. Talk to your pediatrician regarding the use of this medicine in children. Special care may be needed. Overdosage: If you think you have taken too much of this medicine contact  a poison control center or emergency room at once. NOTE: This medicine is only for you. Do not share this medicine with others. What if I miss a dose? It is important not to miss your dose. Call your doctor or health care professional if you are unable to keep an appointment. What may interact with this medicine? Do not take this medicine with any of the following medications:  clozapine This medicine may also interact with the following medications:  atazanavir  cimetidine  ciprofloxacin  enoxacin  fluvoxamine  medicines for seizures like carbamazepine and phenobarbital  mexiletine  rifampin  tacrine  thiabendazole  zileuton This list may not describe all possible interactions. Give your health care provider a list of all the medicines, herbs, non-prescription drugs, or dietary supplements you use. Also tell them if you smoke, drink alcohol, or use illegal drugs. Some items may interact with your medicine. What should I watch for while using this medicine? This drug may make you feel generally unwell. This is not uncommon, as chemotherapy can affect healthy cells as well as cancer cells. Report any side effects. Continue your course of treatment even though you feel ill unless your doctor tells you to stop. You may need blood work done while you are taking this medicine. Call your doctor or healthcare provider for advice if you get a fever, chills or sore throat, or other symptoms of a cold or flu. Do not treat yourself. This drug decreases your body's ability to fight infections. Try to avoid being around people who are sick. This medicine may cause serious skin reactions. They can happen weeks to months after starting the medicine. Contact your healthcare provider right away if you notice fevers or flu-like symptoms with a rash. The rash may be red or purple and then turn into blisters or peeling of the skin. Or, you might notice a red rash with swelling of the face, lips or  lymph nodes in your neck or under your arms. This medicine may increase your risk to bruise or bleed. Call your doctor or healthcare provider if you notice any unusual bleeding. Talk to your doctor about your risk of cancer. You may be more at risk for certain types of cancers if you take this medicine. Do not become pregnant while taking this medicine or for at least 6 months after stopping it. Women should inform their doctor if they wish to become pregnant or think they might be pregnant. Men should not father a child while taking this medicine and for at least 3 months after stopping it. There is a potential for serious side effects to an unborn child. Talk to your healthcare provider or pharmacist for more information. Do not breast-feed an infant while taking this medicine or for at least 1 week after stopping it. This medicine may make it more difficult to father a child. You should talk with your doctor or healthcare provider if you are concerned about your fertility. What side effects may I notice from receiving this medicine? Side effects that you should report to your doctor or health care professional as soon as possible:  allergic reactions like skin rash, itching or hives, swelling of the face,  lips, or tongue  low blood counts - this medicine may decrease the number of white blood cells, red blood cells and platelets. You may be at increased risk for infections and bleeding.  rash, fever, and swollen lymph nodes  redness, blistering, peeling, or loosening of the skin, including inside the mouth  signs of infection like fever or chills, cough, sore throat, pain or difficulty passing urine  signs of decreased platelets or bleeding like bruising, pinpoint red spots on the skin, black, tarry stools, blood in the urine  signs of decreased red blood cells like being unusually weak or tired, fainting spells, lightheadedness  signs and symptoms of kidney injury like trouble passing urine  or change in the amount of urine  signs and symptoms of liver injury like dark yellow or brown urine; general ill feeling or flu-like symptoms; light-colored stools; loss of appetite; nausea; right upper belly pain; unusually weak or tired; yellowing of the eyes or skin Side effects that usually do not require medical attention (report to your doctor or health care professional if they continue or are bothersome):  constipation  decreased appetite  diarrhea  headache  mouth sores  nausea, vomiting  tiredness This list may not describe all possible side effects. Call your doctor for medical advice about side effects. You may report side effects to FDA at 1-800-FDA-1088. Where should I keep my medicine? This drug is given in a hospital or clinic and will not be stored at home. NOTE: This sheet is a summary. It may not cover all possible information. If you have questions about this medicine, talk to your doctor, pharmacist, or health care provider.  2020 Elsevier/Gold Standard (2018-10-20 10:26:46)

## 2020-04-08 LAB — TYPE AND SCREEN
ABO/RH(D): O POS
Antibody Screen: NEGATIVE
Unit division: 0

## 2020-04-08 LAB — BPAM RBC
Blood Product Expiration Date: 202109302359
ISSUE DATE / TIME: 202108270902
Unit Type and Rh: 9500

## 2020-04-11 ENCOUNTER — Telehealth: Payer: Self-pay

## 2020-04-11 ENCOUNTER — Other Ambulatory Visit: Payer: Self-pay | Admitting: Hematology & Oncology

## 2020-04-11 NOTE — Telephone Encounter (Signed)
Patient called stating his numbness is worsening, states he restarted the cymbalta 2 days ago after discussing it with Dr.Ennever. States the numbness in his calves and his fingers are worsening. Informed patient it usually takes 2-4 weeks for it to work. Discussed with Dr.Ennever who states continue the cymbalta to see if it helps in the next couple weeks and to increase B6 to 500mg  a day. Informed patient and he will try the B6 500mg  and continue the cymbalta daily. He will call back if it worsens or doesn't improve.

## 2020-04-18 ENCOUNTER — Other Ambulatory Visit: Payer: Self-pay | Admitting: Hematology & Oncology

## 2020-04-18 DIAGNOSIS — I10 Essential (primary) hypertension: Secondary | ICD-10-CM

## 2020-04-20 LAB — CUP PACEART REMOTE DEVICE CHECK
Date Time Interrogation Session: 20210905230403
Implantable Pulse Generator Implant Date: 20200909

## 2020-04-24 ENCOUNTER — Ambulatory Visit (INDEPENDENT_AMBULATORY_CARE_PROVIDER_SITE_OTHER): Payer: Medicare HMO | Admitting: *Deleted

## 2020-04-24 DIAGNOSIS — I63412 Cerebral infarction due to embolism of left middle cerebral artery: Secondary | ICD-10-CM

## 2020-04-25 ENCOUNTER — Ambulatory Visit: Payer: Medicare HMO | Admitting: Nurse Practitioner

## 2020-04-25 ENCOUNTER — Encounter: Payer: Self-pay | Admitting: Nurse Practitioner

## 2020-04-25 ENCOUNTER — Telehealth: Payer: Self-pay | Admitting: General Surgery

## 2020-04-25 VITALS — BP 134/74 | HR 77 | Ht 70.0 in | Wt 134.2 lb

## 2020-04-25 DIAGNOSIS — R197 Diarrhea, unspecified: Secondary | ICD-10-CM | POA: Diagnosis not present

## 2020-04-25 DIAGNOSIS — K269 Duodenal ulcer, unspecified as acute or chronic, without hemorrhage or perforation: Secondary | ICD-10-CM

## 2020-04-25 DIAGNOSIS — R1310 Dysphagia, unspecified: Secondary | ICD-10-CM | POA: Diagnosis not present

## 2020-04-25 NOTE — Progress Notes (Signed)
04/25/2020 [name] [id number] Jul 18, 1930   Chief Complaint: anemia, ulcer follow up  History of Present Illness: Christian Burns. Christian Burns is an 86 year old male with a past medical history of hypertension, CVA, CKD stage III, multiple myeloma s/p chemo, jaw osteomyelitis, IDA and UGI bleed 09/2019 and serous pneumonia requiring hospitalization 09/28/2019.   As previously reviewed, He was seen in office by Dr. Bryan Lemma on 2/2 for further evaluation for melena and anemia with Hg 5.3. He was seen by Dr. Marin Olp earlier that day and he received 1 unit of PRBCs. An EGD was done 09/17/2019 as an outpatient by Dr. Bryan Lemma which identified esophageal plaques consistent with candidiasis esophagitis, non obstructing Schatzki's ring, bleb in the esophagus, 2cm hiatal hernia, gastritis, hematin in the entire stomach an oozing duodenal ulcers, clips were placed. Biopsies confirmed fungal elements to the esophagus, mild reactive gastropathy without evidence of H. Pylor and peptic duodenitis. He was prescribed Protonix 75m po bid x 8 week and Difulcan 4070mx 1 then 20071mo daily x 3 weeks, however, he inadvertently did not take these medications.   I last saw [name] the office on 11/18/2019. At that time, his fatigue was less. He denied having any abdominal pain. No significant heartburn. He reported having soft BMs with urgency. No melena or rectal bleeding. He was advised to take the Protonix 40 mg twice daily and Diflucan which were prescribed following his EGD 09/17/2019.  He presents today accompanied by his grandson Christian Burns further follow-up. He continues to take Pantoprazole 40 mg p.o. twice daily. He denies having any heartburn, upper or lower abdominal pain. He has infrequent dysphagia which he thinks started after his EGD was completed. He sometimes feels as if food is slower to pass down the esophagus and a few times food was stuck in the upper esophagus which passed after he coughed. He reports  having infrequent dysphagia which he noticed however, he has frequent urgent soft stools which is quite bothersome to him. No rectal bleeding or melena.  See modified barium swallow results below. His appetite is fair. He has lost 6lbs over the past 4 to 6 weeks. He is on Marinol.  CBC Latest Ref Rng & Units 04/06/2020 03/08/2020 02/09/2020  WBC 4.0 - 10.5 K/uL 5.2 4.4 4.6  Hemoglobin 13.0 - 17.0 g/dL 8.8(L) 8.9(L) 9.3(L)  Hematocrit 39 - 52 % 27.5(L) 26.9(L) 27.5(L)  Platelets 150 - 400 K/uL 127(L) 115(L) 112(L)    CMP Latest Ref Rng & Units 04/06/2020 03/08/2020 02/09/2020  Glucose 70 - 99 mg/dL 115(H) 138(H) 122(H)  BUN 8 - 23 mg/dL 55(H) 43(H) 41(H)  Creatinine 0.61 - 1.24 mg/dL 1.56(H) 1.82(H) 1.57(H)  Sodium 135 - 145 mmol/L 141 140 141  Potassium 3.5 - 5.1 mmol/L 3.7 3.5 3.8  Chloride 98 - 111 mmol/L 105 108 108  CO2 22 - 32 mmol/L _0 Calcium 8.9 - 10.3 mg/dL 9.2 8.5(L) 8.5(L)  Total Protein 6.5 - 8.1 g/dL 5.4(L) 5.1(L) 5.0(L)  Total Bilirubin 0.3 - 1.2 mg/dL 0.6 0.6 0.6  Alkaline Phos 38 - 126 U/L 53 50 50  AST 15 - 41 U/L _1 ALT 0 - 44 U/L _2 EGD 09/17/2019 by Dr. CirBryan Lemma Esophageal plaques were found, suspicious for candidiasis. Biopsied. - Non-obstructing and mild Schatzki ring. - Bleb found in the esophagus. - 2 cm hiatal hernia. - Gastritis. Biopsied. - Hematin (altered blood/coffee-ground-like material) in the entire  stomach. - Oozing duodenal ulcers. Clip was placed. Biopsied. - Normal second portion of the duodenum.  Modified swallowing study 12/14/2019: SLP suspects patient's primary symptoms are due to decreased relaxation of UES.  Assessed to be a mild aspiration risk.   Current Outpatient Medications on File Prior to Visit  Medication Sig Dispense Refill  . amLODipine-benazepril (LOTREL) 5-10 MG capsule TAKE ONE CAPSULE EACH DAY 30 capsule 2  . aspirin EC 81 MG tablet Take 1 tablet (81 mg total) by mouth daily. Swallow whole. 30 tablet 11   . dronabinol (MARINOL) 5 MG capsule TAKE ONE CAPSULE TWICE A DAY BEFORE A MEAL 60 capsule 0  . DULoxetine (CYMBALTA) 60 MG capsule Take 1 capsule (60 mg total) by mouth daily. 30 capsule 3  . finasteride (PROSCAR) 5 MG tablet TAKE ONE TABLET EACH DAY (Patient taking differently: Take 5 mg by mouth daily. ) 30 tablet 6  . lidocaine-prilocaine (EMLA) cream Apply 1 application topically as needed. 30 g 0  . metoprolol succinate (TOPROL-XL) 25 MG 24 hr tablet Take 25 mg by mouth daily.    . Multiple Vitamins-Minerals (QC MULTI-VITE 50 & OVER PO) TAKE ONE TABLET DAILY    . ondansetron (ZOFRAN) 4 MG tablet Take 1 tablet (4 mg total) by mouth every 6 (six) hours as needed for nausea. 20 tablet 0  . pantoprazole (PROTONIX) 40 MG tablet Take 1 tablet (40 mg total) by mouth 2 (two) times daily. 60 tablet 3  . penicillin v potassium (VEETID) 500 MG tablet Take 500 mg by mouth 4 (four) times daily.    . polyvinyl alcohol (LIQUIFILM TEARS) 1.4 % ophthalmic solution Place 1 drop into both eyes as needed for dry eyes. 15 mL 0  . predniSONE (DELTASONE) 20 MG tablet TAKE ONE TABLET EACH DAY WITH BREAKFAST 30 tablet 2  . Probiotic Product (PROBIOTIC PO) Take 1 capsule by mouth daily.    Marland Kitchen pyridOXINE (VITAMIN B-6) 100 MG tablet Take 100 mg by mouth daily.     . traMADol (ULTRAM) 50 MG tablet Take 1 tablet (50 mg total) by mouth every 8 (eight) hours as needed for moderate pain. 10 tablet 0  . vitamin B-12 (CYANOCOBALAMIN) 500 MCG tablet Take 500 mcg by mouth daily.    . Vitamin D, Ergocalciferol, (DRISDOL) 50000 UNITS CAPS capsule Take 50,000 Units by mouth every Sunday.      No current facility-administered medications on file prior to visit.    Allergies  Allergen Reactions  . Sulfa Antibiotics Itching  . Sulfasalazine Itching    Current Medications, Allergies, Past Medical History, Past Surgical History, Family History and Social History were reviewed in [employer]  record.   Review of Systems:   Constitutional: + weight loss.  Respiratory: Negative for shortness of breath.   Cardiovascular: Negative for chest pain, palpitations and leg swelling.  Gastrointestinal: See HPI.  Musculoskeletal: Negative for back pain or muscle aches.  Neurological: Negative for dizziness, headaches or paresthesias.    Physical Exam: BP 134/74 (BP Location: Left Arm, Patient Position: Sitting)   Pulse 77   Ht $R'5\' 10"'xN$  (1.778 m)   Wt 134 lb 3.2 oz (60.9 kg)   SpO2 98%   BMI 19.26 kg/m    Wt Readings from Last 3 Encounters:  04/25/20 134 lb 3.2 oz (60.9 kg)  04/06/20 138 lb (62.6 kg)  03/15/20 140 lb (63.5 kg)    General: Frail appearing thin 86 year old male in no acute distress. Head: Normocephalic and  atraumatic. Eyes: No scleral icterus. Conjunctiva pink . Ears: Normal auditory acuity. Mouth: Dentition intact. No ulcers or lesions.  Lungs: Clear throughout to auscultation. Heart: Regular rate and rhythm, no murmur. Abdomen: Soft, nontender and nondistended. No masses or hepatomegaly. Normal bowel sounds x 4 quadrants.  Rectal: Deferred.  Musculoskeletal: Symmetrical with no gross deformities. Extremities: Right ankle edema  > L.  Neurological: Alert oriented x 3. No focal deficits.  Psychological: Alert and cooperative. Normal mood and affect  Assessment and Recommendations:  57. 22. 86 year old male with a complex medical history including multiple myeloma with UGI bleed and anemia 09/14/2019. Dysphagia. EGD 09/17/2019 candidiasis esophagitis, nonobstructing Schatzki's ring, blood in the esophagus, a 2 cm hiatal hernia, gastritis and hematin in the stomach and an oozing duodenal ulcer which was clipped. No evidence of H. Pylori. He was prescribed Pantoprazole 40 mg p.o. twice daily x 8 weeks and Diflucan 400 mg p.o. x 1 then 200 mg p.o. daily for 21 days, however, he did not take these medications until 11/2019.  -Decrease Pantoprazole 40 mg once daily for  now -Dysphagia 3 diet as previously recommended by speech pathologist  -If his dysphagia persists or worsens, consider barium swallow and repeat course of Diflucan (patient at risk for recurrent candidiasis as he remains on Prednisone, not sure he is on Prednisone).   2. Change in bowel pattern, urgent soft stools, sometimes loose stools. -PPI may be contributing to his loose stools. Pantoprazole was decreased to once daily and Dr. Bryan Lemma will verify if he needs to continue this medication. -Check GI pathogen to include C. difficile PCR -Patient/family will call our office if symptoms worsen   3. Anemia, multifocal due to multiple myeloma and chemo, CKD, GI bleed. Hg 8.8 on 8/26 and he was transfused 1 unit of PRBCs  on 8/27 per Dr. Marin Olp.  -Patient to continue follow up and labs with Dr. Marin Olp as scheduled 05/03/2020  4. 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 Pneumonia/SIRS 09/2019

## 2020-04-25 NOTE — Telephone Encounter (Signed)
Patients grandson [name] called the office and stated he would come to the lab at [facility] tomorrow to pick up the patients stool test kit.

## 2020-04-25 NOTE — Progress Notes (Signed)
Carelink Summary Report / Loop Recorder 

## 2020-04-25 NOTE — Telephone Encounter (Signed)
Left a voicemail for the grandson to call the office to set up a time to come to the lab and pick up a kit for stool studies.

## 2020-04-25 NOTE — Patient Instructions (Signed)
If you are age 84 or older, your body mass index should be between 23-30. Your Body mass index is 19.26 kg/m. If this is out of the aforementioned range listed, please consider follow up with your Primary Care Provider.  If you are age 27 or younger, your body mass index should be between 19-25. Your Body mass index is 19.26 kg/m. If this is out of the aformentioned range listed, please consider follow up with your Primary Care Provider.   1. Reduce pantoprazole 40 mg daily 2. Follow up with Dr Bryan Lemma in 3-4 months. Please call in December 2021 to schedule your appointment. 3. Call office if swallowing difficulty increases. 4. Use 1 tablespoon if Benefiber daily to bulk up stools.   Due to recent changes in healthcare laws, you may see the results of your imaging and laboratory studies on MyChart before your provider has had a chance to review them.  We understand that in some cases there may be results that are confusing or concerning to you. Not all laboratory results come back in the same time frame and the provider may be waiting for multiple results in order to interpret others.  Please give [place] 48 hours in order for your provider to thoroughly review all the results before contacting the office for clarification of your results.   Thank you for choosing [facility] Noralyn Pick, CRNP  [phone]

## 2020-04-25 NOTE — Telephone Encounter (Signed)
Spoke with Mrs Walmer, we would like to have the patient come in for stool studies. She asked if I would contact her grandson to make the arrangements.

## 2020-04-25 NOTE — Progress Notes (Signed)
Agree with the assessment and plan as outlined by Carl Best, NP.  Agree with plan to taper PPI to daily and can eval for clinical improvement in LGI sxs with reduced acid suppression therapy. Otherwise, would ideally like to continue some degree of PPI for ongoing gastric prophy, particularly given need for ongoing Prednisone.   Gerrit Heck, DO, [facility]

## 2020-04-26 ENCOUNTER — Other Ambulatory Visit: Payer: Self-pay | Admitting: Hematology & Oncology

## 2020-05-01 ENCOUNTER — Other Ambulatory Visit: Payer: Medicare HMO

## 2020-05-01 DIAGNOSIS — C9002 Multiple myeloma in relapse: Secondary | ICD-10-CM

## 2020-05-01 DIAGNOSIS — R197 Diarrhea, unspecified: Secondary | ICD-10-CM

## 2020-05-03 ENCOUNTER — Inpatient Hospital Stay: Payer: Medicare HMO | Attending: Hematology & Oncology | Admitting: Hematology & Oncology

## 2020-05-03 ENCOUNTER — Inpatient Hospital Stay: Payer: Medicare HMO

## 2020-05-03 ENCOUNTER — Encounter: Payer: Self-pay | Admitting: Hematology & Oncology

## 2020-05-03 ENCOUNTER — Telehealth: Payer: Self-pay | Admitting: Hematology & Oncology

## 2020-05-03 ENCOUNTER — Other Ambulatory Visit: Payer: Self-pay

## 2020-05-03 VITALS — BP 163/82 | HR 82 | Temp 97.6°F | Resp 18 | Wt 134.2 lb

## 2020-05-03 VITALS — BP 152/78 | HR 74 | Temp 97.5°F | Resp 16

## 2020-05-03 DIAGNOSIS — C9002 Multiple myeloma in relapse: Secondary | ICD-10-CM | POA: Diagnosis present

## 2020-05-03 DIAGNOSIS — R634 Abnormal weight loss: Secondary | ICD-10-CM | POA: Diagnosis not present

## 2020-05-03 DIAGNOSIS — R002 Palpitations: Secondary | ICD-10-CM | POA: Insufficient documentation

## 2020-05-03 DIAGNOSIS — Z8673 Personal history of transient ischemic attack (TIA), and cerebral infarction without residual deficits: Secondary | ICD-10-CM | POA: Diagnosis not present

## 2020-05-03 DIAGNOSIS — R159 Full incontinence of feces: Secondary | ICD-10-CM | POA: Diagnosis not present

## 2020-05-03 DIAGNOSIS — N289 Disorder of kidney and ureter, unspecified: Secondary | ICD-10-CM | POA: Insufficient documentation

## 2020-05-03 DIAGNOSIS — D649 Anemia, unspecified: Secondary | ICD-10-CM | POA: Diagnosis not present

## 2020-05-03 DIAGNOSIS — Z5111 Encounter for antineoplastic chemotherapy: Secondary | ICD-10-CM | POA: Insufficient documentation

## 2020-05-03 DIAGNOSIS — Z882 Allergy status to sulfonamides status: Secondary | ICD-10-CM | POA: Diagnosis not present

## 2020-05-03 DIAGNOSIS — Z923 Personal history of irradiation: Secondary | ICD-10-CM | POA: Diagnosis not present

## 2020-05-03 DIAGNOSIS — M2550 Pain in unspecified joint: Secondary | ICD-10-CM | POA: Diagnosis not present

## 2020-05-03 DIAGNOSIS — Z79899 Other long term (current) drug therapy: Secondary | ICD-10-CM | POA: Diagnosis not present

## 2020-05-03 DIAGNOSIS — C9000 Multiple myeloma not having achieved remission: Secondary | ICD-10-CM | POA: Insufficient documentation

## 2020-05-03 DIAGNOSIS — C9001 Multiple myeloma in remission: Secondary | ICD-10-CM | POA: Diagnosis not present

## 2020-05-03 DIAGNOSIS — R5383 Other fatigue: Secondary | ICD-10-CM | POA: Insufficient documentation

## 2020-05-03 DIAGNOSIS — R0602 Shortness of breath: Secondary | ICD-10-CM | POA: Diagnosis not present

## 2020-05-03 LAB — CBC WITH DIFFERENTIAL (CANCER CENTER ONLY)
Abs Immature Granulocytes: 0.49 10*3/uL — ABNORMAL HIGH (ref 0.00–0.07)
Basophils Absolute: 0 10*3/uL (ref 0.0–0.1)
Basophils Relative: 0 %
Eosinophils Absolute: 0 10*3/uL (ref 0.0–0.5)
Eosinophils Relative: 1 %
HCT: 29.2 % — ABNORMAL LOW (ref 39.0–52.0)
Hemoglobin: 9.3 g/dL — ABNORMAL LOW (ref 13.0–17.0)
Immature Granulocytes: 11 %
Lymphocytes Relative: 2 %
Lymphs Abs: 0.1 10*3/uL — ABNORMAL LOW (ref 0.7–4.0)
MCH: 29.2 pg (ref 26.0–34.0)
MCHC: 31.8 g/dL (ref 30.0–36.0)
MCV: 91.5 fL (ref 80.0–100.0)
Monocytes Absolute: 0.5 10*3/uL (ref 0.1–1.0)
Monocytes Relative: 11 %
Neutro Abs: 3.4 10*3/uL (ref 1.7–7.7)
Neutrophils Relative %: 75 %
Platelet Count: 145 10*3/uL — ABNORMAL LOW (ref 150–400)
RBC: 3.19 MIL/uL — ABNORMAL LOW (ref 4.22–5.81)
RDW: 16.2 % — ABNORMAL HIGH (ref 11.5–15.5)
WBC Count: 4.5 10*3/uL (ref 4.0–10.5)
nRBC: 0 % (ref 0.0–0.2)

## 2020-05-03 LAB — CMP (CANCER CENTER ONLY)
ALT: 13 U/L (ref 0–44)
AST: 24 U/L (ref 15–41)
Albumin: 3.1 g/dL — ABNORMAL LOW (ref 3.5–5.0)
Alkaline Phosphatase: 51 U/L (ref 38–126)
Anion gap: 8 (ref 5–15)
BUN: 44 mg/dL — ABNORMAL HIGH (ref 8–23)
CO2: 27 mmol/L (ref 22–32)
Calcium: 8.9 mg/dL (ref 8.9–10.3)
Chloride: 106 mmol/L (ref 98–111)
Creatinine: 1.57 mg/dL — ABNORMAL HIGH (ref 0.61–1.24)
GFR, Est AFR Am: 45 mL/min — ABNORMAL LOW (ref >60)
GFR, Estimated: 39 mL/min — ABNORMAL LOW (ref >60)
Glucose, Bld: 131 mg/dL — ABNORMAL HIGH (ref 70–99)
Potassium: 3.9 mmol/L (ref 3.5–5.1)
Sodium: 141 mmol/L (ref 135–145)
Total Bilirubin: 0.5 mg/dL (ref 0.3–1.2)
Total Protein: 5.2 g/dL — ABNORMAL LOW (ref 6.5–8.1)

## 2020-05-03 LAB — GI PROFILE, STOOL, PCR
Adenovirus F 40/41: NOT DETECTED
Astrovirus: NOT DETECTED
C difficile toxin A/B: NOT DETECTED
Campylobacter: NOT DETECTED
Cryptosporidium: NOT DETECTED
Cyclospora cayetanensis: NOT DETECTED
E coli O157: NOT DETECTED
Entamoeba histolytica: NOT DETECTED
Enteroaggregative E coli: NOT DETECTED
Enteropathogenic E coli: NOT DETECTED
Enterotoxigenic E coli: NOT DETECTED
Giardia lamblia: NOT DETECTED
Norovirus GI/GII: NOT DETECTED
Plesiomonas shigelloides: NOT DETECTED
Rotavirus A: NOT DETECTED
Salmonella: NOT DETECTED
Sapovirus: NOT DETECTED
Shiga-toxin-producing E coli: NOT DETECTED
Shigella/Enteroinvasive E coli: NOT DETECTED
Vibrio cholerae: NOT DETECTED
Vibrio: NOT DETECTED
Yersinia enterocolitica: NOT DETECTED

## 2020-05-03 LAB — FERRITIN: Ferritin: 741 ng/mL — ABNORMAL HIGH (ref 24–336)

## 2020-05-03 LAB — LACTATE DEHYDROGENASE: LDH: 462 U/L — ABNORMAL HIGH (ref 98–192)

## 2020-05-03 MED ORDER — SODIUM CHLORIDE 0.9 % IV SOLN
Freq: Once | INTRAVENOUS | Status: DC
  Filled 2020-05-03: qty 250

## 2020-05-03 MED ORDER — FAMOTIDINE IN NACL 20-0.9 MG/50ML-% IV SOLN
20.0000 mg | Freq: Once | INTRAVENOUS | Status: AC
  Administered 2020-05-03: 20 mg via INTRAVENOUS

## 2020-05-03 MED ORDER — DIPHENHYDRAMINE HCL 25 MG PO CAPS
25.0000 mg | ORAL_CAPSULE | Freq: Once | ORAL | Status: AC
  Administered 2020-05-03: 25 mg via ORAL

## 2020-05-03 MED ORDER — SODIUM CHLORIDE 0.9 % IV SOLN
20.0000 mg | Freq: Once | INTRAVENOUS | Status: AC
  Administered 2020-05-03: 20 mg via INTRAVENOUS
  Filled 2020-05-03: qty 20

## 2020-05-03 MED ORDER — HEPARIN SOD (PORK) LOCK FLUSH 100 UNIT/ML IV SOLN
500.0000 [IU] | Freq: Once | INTRAVENOUS | Status: AC | PRN
  Administered 2020-05-03: 500 [IU]
  Filled 2020-05-03: qty 5

## 2020-05-03 MED ORDER — SODIUM CHLORIDE 0.9% FLUSH
10.0000 mL | INTRAVENOUS | Status: DC | PRN
  Administered 2020-05-03: 10 mL
  Filled 2020-05-03: qty 10

## 2020-05-03 MED ORDER — DEXTROSE 5 % IV SOLN
INTRAVENOUS | Status: DC
  Filled 2020-05-03: qty 250

## 2020-05-03 MED ORDER — IMMUNE GLOBULIN (HUMAN) 20 GM/200ML IV SOLN
40.0000 g | Freq: Once | INTRAVENOUS | Status: AC
  Administered 2020-05-03: 40 g via INTRAVENOUS
  Filled 2020-05-03: qty 400

## 2020-05-03 MED ORDER — FAMOTIDINE IN NACL 20-0.9 MG/50ML-% IV SOLN
INTRAVENOUS | Status: AC
  Filled 2020-05-03: qty 50

## 2020-05-03 MED ORDER — SODIUM CHLORIDE 0.9 % IV SOLN
75.0000 mg/m2 | Freq: Once | INTRAVENOUS | Status: AC
  Administered 2020-05-03: 125 mg via INTRAVENOUS
  Filled 2020-05-03: qty 5

## 2020-05-03 MED ORDER — EPOETIN ALFA-EPBX 40000 UNIT/ML IJ SOLN
40000.0000 [IU] | Freq: Once | INTRAMUSCULAR | Status: AC
  Administered 2020-05-03: 40000 [IU] via SUBCUTANEOUS

## 2020-05-03 MED ORDER — ACETAMINOPHEN 325 MG PO TABS
ORAL_TABLET | ORAL | Status: AC
  Filled 2020-05-03: qty 2

## 2020-05-03 MED ORDER — PALONOSETRON HCL INJECTION 0.25 MG/5ML
INTRAVENOUS | Status: AC
  Filled 2020-05-03: qty 5

## 2020-05-03 MED ORDER — ACETAMINOPHEN 325 MG PO TABS
650.0000 mg | ORAL_TABLET | Freq: Once | ORAL | Status: AC
  Administered 2020-05-03: 650 mg via ORAL

## 2020-05-03 MED ORDER — PALONOSETRON HCL INJECTION 0.25 MG/5ML
0.2500 mg | Freq: Once | INTRAVENOUS | Status: AC
  Administered 2020-05-03: 0.25 mg via INTRAVENOUS

## 2020-05-03 MED ORDER — DIPHENHYDRAMINE HCL 25 MG PO CAPS
ORAL_CAPSULE | ORAL | Status: AC
  Filled 2020-05-03: qty 1

## 2020-05-03 MED ORDER — SODIUM CHLORIDE 0.9 % IV SOLN: 10.0000 mg | Freq: Once | INTRAVENOUS | Status: DC

## 2020-05-03 MED ORDER — EPOETIN ALFA-EPBX 40000 UNIT/ML IJ SOLN
INTRAMUSCULAR | Status: AC
  Filled 2020-05-03: qty 1

## 2020-05-03 NOTE — Patient Instructions (Signed)
Immune Globulin Injection What is this medicine? IMMUNE GLOBULIN (im MUNE GLOB yoo lin) helps to prevent or reduce the severity of certain infections in patients who are at risk. This medicine is collected from the pooled blood of many donors. It is used to treat immune system problems, thrombocytopenia, and Kawasaki syndrome. This medicine may be used for other purposes; ask your health care provider or pharmacist if you have questions. COMMON BRAND NAME(S): ASCENIV, Baygam, BIVIGAM, Carimune, Carimune NF, cutaquig, Cuvitru, Flebogamma, Flebogamma DIF, GamaSTAN, GamaSTAN S/D, Gamimune N, Gammagard, Gammagard S/D, Gammaked, Gammaplex, Gammar-P IV, Gamunex, Gamunex-C, Hizentra, Iveegam, Iveegam EN, Octagam, Panglobulin, Panglobulin NF, panzyga, Polygam S/D, Privigen, Sandoglobulin, Venoglobulin-S, Vigam, Vivaglobulin, Xembify What should I tell my health care provider before I take this medicine? They need to know if you have any of these conditions:  diabetes  extremely low or no immune antibodies in the blood  heart disease  history of blood clots  hyperprolinemia  infection in the blood, sepsis  kidney disease  recently received or scheduled to receive a vaccination  an unusual or allergic reaction to human immune globulin, albumin, maltose, sucrose, other medicines, foods, dyes, or preservatives  pregnant or trying to get pregnant  breast-feeding How should I use this medicine? This medicine is for injection into a muscle or infusion into a vein or skin. It is usually given by a health care professional in a hospital or clinic setting. In rare cases, some brands of this medicine might be given at home. You will be taught how to give this medicine. Use exactly as directed. Take your medicine at regular intervals. Do not take your medicine more often than directed. Talk to your pediatrician regarding the use of this medicine in children. While this drug may be prescribed for selected  conditions, precautions do apply. Overdosage: If you think you have taken too much of this medicine contact a poison control center or emergency room at once. NOTE: This medicine is only for you. Do not share this medicine with others. What if I miss a dose? It is important not to miss your dose. Call your doctor or health care professional if you are unable to keep an appointment. If you give yourself the medicine and you miss a dose, take it as soon as you can. If it is almost time for your next dose, take only that dose. Do not take double or extra doses. What may interact with this medicine?  aspirin and aspirin-like medicines  cisplatin  cyclosporine  medicines for infection like acyclovir, adefovir, amphotericin B, bacitracin, cidofovir, foscarnet, ganciclovir, gentamicin, pentamidine, vancomycin  NSAIDS, medicines for pain and inflammation, like ibuprofen or naproxen  pamidronate  vaccines  zoledronic acid This list may not describe all possible interactions. Give your health care provider a list of all the medicines, herbs, non-prescription drugs, or dietary supplements you use. Also tell them if you smoke, drink alcohol, or use illegal drugs. Some items may interact with your medicine. What should I watch for while using this medicine? Your condition will be monitored carefully while you are receiving this medicine. This medicine is made from pooled blood donations of many different people. It may be possible to pass an infection in this medicine. However, the donors are screened for infections and all products are tested for HIV and hepatitis. The medicine is treated to kill most or all bacteria and viruses. Talk to your doctor about the risks and benefits of this medicine. Do not have vaccinations for at least  14 days before, or until at least 3 months after receiving this medicine. What side effects may I notice from receiving this medicine? Side effects that you should report  to your doctor or health care professional as soon as possible:  allergic reactions like skin rash, itching or hives, swelling of the face, lips, or tongue  blue colored lips or skin  breathing problems  chest pain or tightness  fever  signs and symptoms of aseptic meningitis such as stiff neck; sensitivity to light; headache; drowsiness; fever; nausea; vomiting; rash  signs and symptoms of a blood clot such as chest pain; shortness of breath; pain, swelling, or warmth in the leg  signs and symptoms of hemolytic anemia such as fast heartbeat; tiredness; dark yellow or brown urine; or yellowing of the eyes or skin  signs and symptoms of kidney injury like trouble passing urine or change in the amount of urine  sudden weight gain  swelling of the ankles, feet, hands Side effects that usually do not require medical attention (report to your doctor or health care professional if they continue or are bothersome):  diarrhea  flushing  headache  increased sweating  joint pain  muscle cramps  muscle pain  nausea  pain, redness, or irritation at site where injected  tiredness This list may not describe all possible side effects. Call your doctor for medical advice about side effects. You may report side effects to FDA at 1-800-FDA-1088. Where should I keep my medicine? Keep out of the reach of children. This drug is usually given in a hospital or clinic and will not be stored at home. In rare cases, some brands of this medicine may be given at home. If you are using this medicine at home, you will be instructed on how to store this medicine. Throw away any unused medicine after the expiration date on the label. NOTE: This sheet is a summary. It may not cover all possible information. If you have questions about this medicine, talk to your doctor, pharmacist, or health care provider.  2020 Elsevier/Gold Standard (2019-03-03 12:51:14) Bendamustine Injection What is this  medicine? BENDAMUSTINE (BEN da MUS teen) is a chemotherapy drug. It is used to treat chronic lymphocytic leukemia and non-Hodgkin lymphoma. This medicine may be used for other purposes; ask your health care provider or pharmacist if you have questions. COMMON BRAND NAME(S): Kristine Royal, Treanda What should I tell my health care provider before I take this medicine? They need to know if you have any of these conditions:  infection (especially a virus infection such as chickenpox, cold sores, or herpes)  kidney disease  liver disease  an unusual or allergic reaction to bendamustine, mannitol, other medicines, foods, dyes, or preservatives  pregnant or trying to get pregnant  breast-feeding How should I use this medicine? This medicine is for infusion into a vein. It is given by a health care professional in a hospital or clinic setting. Talk to your pediatrician regarding the use of this medicine in children. Special care may be needed. Overdosage: If you think you have taken too much of this medicine contact a poison control center or emergency room at once. NOTE: This medicine is only for you. Do not share this medicine with others. What if I miss a dose? It is important not to miss your dose. Call your doctor or health care professional if you are unable to keep an appointment. What may interact with this medicine? Do not take this medicine with any of  the following medications:  clozapine This medicine may also interact with the following medications:  atazanavir  cimetidine  ciprofloxacin  enoxacin  fluvoxamine  medicines for seizures like carbamazepine and phenobarbital  mexiletine  rifampin  tacrine  thiabendazole  zileuton This list may not describe all possible interactions. Give your health care provider a list of all the medicines, herbs, non-prescription drugs, or dietary supplements you use. Also tell them if you smoke, drink alcohol, or use  illegal drugs. Some items may interact with your medicine. What should I watch for while using this medicine? This drug may make you feel generally unwell. This is not uncommon, as chemotherapy can affect healthy cells as well as cancer cells. Report any side effects. Continue your course of treatment even though you feel ill unless your doctor tells you to stop. You may need blood work done while you are taking this medicine. Call your doctor or healthcare provider for advice if you get a fever, chills or sore throat, or other symptoms of a cold or flu. Do not treat yourself. This drug decreases your body's ability to fight infections. Try to avoid being around people who are sick. This medicine may cause serious skin reactions. They can happen weeks to months after starting the medicine. Contact your healthcare provider right away if you notice fevers or flu-like symptoms with a rash. The rash may be red or purple and then turn into blisters or peeling of the skin. Or, you might notice a red rash with swelling of the face, lips or lymph nodes in your neck or under your arms. This medicine may increase your risk to bruise or bleed. Call your doctor or healthcare provider if you notice any unusual bleeding. Talk to your doctor about your risk of cancer. You may be more at risk for certain types of cancers if you take this medicine. Do not become pregnant while taking this medicine or for at least 6 months after stopping it. Women should inform their doctor if they wish to become pregnant or think they might be pregnant. Men should not father a child while taking this medicine and for at least 3 months after stopping it. There is a potential for serious side effects to an unborn child. Talk to your healthcare provider or pharmacist for more information. Do not breast-feed an infant while taking this medicine or for at least 1 week after stopping it. This medicine may make it more difficult to father a child.  You should talk with your doctor or healthcare provider if you are concerned about your fertility. What side effects may I notice from receiving this medicine? Side effects that you should report to your doctor or health care professional as soon as possible:  allergic reactions like skin rash, itching or hives, swelling of the face, lips, or tongue  low blood counts - this medicine may decrease the number of white blood cells, red blood cells and platelets. You may be at increased risk for infections and bleeding.  rash, fever, and swollen lymph nodes  redness, blistering, peeling, or loosening of the skin, including inside the mouth  signs of infection like fever or chills, cough, sore throat, pain or difficulty passing urine  signs of decreased platelets or bleeding like bruising, pinpoint red spots on the skin, black, tarry stools, blood in the urine  signs of decreased red blood cells like being unusually weak or tired, fainting spells, lightheadedness  signs and symptoms of kidney injury like trouble passing urine  or change in the amount of urine  signs and symptoms of liver injury like dark yellow or brown urine; general ill feeling or flu-like symptoms; light-colored stools; loss of appetite; nausea; right upper belly pain; unusually weak or tired; yellowing of the eyes or skin Side effects that usually do not require medical attention (report to your doctor or health care professional if they continue or are bothersome):  constipation  decreased appetite  diarrhea  headache  mouth sores  nausea, vomiting  tiredness This list may not describe all possible side effects. Call your doctor for medical advice about side effects. You may report side effects to FDA at 1-800-FDA-1088. Where should I keep my medicine? This drug is given in a hospital or clinic and will not be stored at home. NOTE: This sheet is a summary. It may not cover all possible information. If you have  questions about this medicine, talk to your doctor, pharmacist, or health care provider.  2020 Elsevier/Gold Standard (2018-10-20 10:26:46)

## 2020-05-03 NOTE — Progress Notes (Signed)
Hematology and Oncology Follow Up Visit  Christian Burns [id number] 17-Jul-1930 86 y.o. 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 Palliative radiation therapy to right ilium Kyprolis/Cytoxanq 3 week dosing- s/p cycle 24 (Cytoxan restarted on cycle 13) - DC'd due to progression Radiation therapy to right femur, 10 fraction --completed 3000 rad on 11/06/2018 Elotuzomab - started 10/29/2018 -- s/p cycle #1 -- d/c on 12/24/2018 Pomalidomide 2 mg po q day (21 on/7 off) - started 10/29/2018 -- d/c 12/31/2018 Daratumumab - start cycle # 1 on 12/31/2018 -- d/c on -02/04/2019  Current Therapy: Bendamustine/Decadron -- started on 02/10/2019, s/p cycle#11 -- Bendamustine given just 1 day Zometa IV q 4 weeks - on hold Retacrit needed for hemoglobin less than 10 IVIG 40g IV q month -- start on 07/2019   Interim History:  Christian Burns is here today for follow-up and treatment.  The problem that we have is his weight loss. His weight loss is becoming more of a problem.  He now weighs 134 pounds.  He just does not want to eat that much.  We have him on Marinol.  I cannot put him on Megace because of his history of CVA.  He is on low-dose prednisone.  It is hard to say how much this is really helping.  He is not having any diarrhea.  He does have some bowel incontinence.  I am not sure what to make of this.  He has had no problems with pain.  He has responded incredibly well to the bendamustine.  His lambda light chain is now 1.9 mg/dL.  This keeps coming down.  We changed his bendamustine to just 1 dose.  Hopefully, he will be able to manage this.  He is on IVIG.  When we saw him back in August, his IgG level was  210 mg/dL.  I am not going to give him any Retacrit today.  His blood pressure is a little bit on the high side.  He has had no cough or shortness of breath.  He thinks that the Cymbalta might be helping his neuropathy.  Overall, his performance status is ECOG 2.    Medications:  Allergies as of 05/03/2020      Reactions   Sulfa Antibiotics Itching   Sulfasalazine Itching      Medication List       Accurate as of May 03, 2020  9:37 AM. If you have any questions, ask your nurse or doctor.        amLODipine-benazepril 5-10 MG capsule Commonly known as: LOTREL TAKE ONE CAPSULE EACH DAY   aspirin EC 81 MG tablet Take 1 tablet (81 mg total) by mouth daily. Swallow whole.   dronabinol 5 MG capsule Commonly known as: MARINOL TAKE ONE CAPSULE TWICE A DAY BEFORE A MEAL   DULoxetine 60 MG capsule Commonly known as: CYMBALTA Take 1 capsule (60 mg total) by mouth daily.   finasteride 5 MG tablet Commonly known as: PROSCAR TAKE ONE TABLET EACH DAY What changed: See the new instructions.   lidocaine-prilocaine cream Commonly known as: EMLA Apply 1 application topically as needed.   metoprolol succinate 25 MG 24 hr tablet Commonly known as: TOPROL-XL Take 25 mg by mouth daily.   ondansetron 4 MG  tablet Commonly known as: ZOFRAN Take 1 tablet (4 mg total) by mouth every 6 (six) hours as needed for nausea.   pantoprazole 40 MG tablet Commonly known as: Protonix Take 1 tablet (40 mg total) by mouth 2 (two) times daily.   penicillin v potassium 500 MG tablet Commonly known as: VEETID Take 500 mg by mouth 4 (four) times daily.   polyvinyl alcohol 1.4 % ophthalmic solution Commonly known as: LIQUIFILM TEARS Place 1 drop into both eyes as needed for dry eyes.   predniSONE 20 MG tablet Commonly known as: DELTASONE TAKE ONE TABLET EACH DAY WITH BREAKFAST   PROBIOTIC PO Take 1 capsule by mouth daily.   pyridOXINE 100 MG tablet Commonly known as: VITAMIN  B-6 Take 100 mg by mouth daily.   QC MULTI-VITE 50 & OVER PO TAKE ONE TABLET DAILY   traMADol 50 MG tablet Commonly known as: ULTRAM Take 1 tablet (50 mg total) by mouth every 8 (eight) hours as needed for moderate pain.   vitamin B-12 500 MCG tablet Commonly known as: CYANOCOBALAMIN Take 500 mcg by mouth daily.   Vitamin D (Ergocalciferol) 1.25 MG (50000 UNIT) Caps capsule Commonly known as: DRISDOL Take 50,000 Units by mouth every Sunday.       Allergies:  Allergies  Allergen Reactions  . Sulfa Antibiotics Itching  . Sulfasalazine Itching    Past Medical History, Surgical history, Social history, and Family History were reviewed and updated.  Review of Systems: Review of Systems  Constitutional: Positive for malaise/fatigue.  HENT: Negative.   Eyes: Negative.   Respiratory: Positive for shortness of breath.   Cardiovascular: Positive for palpitations.  Gastrointestinal: Negative.   Genitourinary: Negative.   Musculoskeletal: Positive for joint pain.  Skin: Negative.   Neurological: Positive for sensory change.  Endo/Heme/Allergies: Negative.   Psychiatric/Behavioral: Negative.      Physical Exam:  weight is 134 lb 4 oz (60.9 kg). His oral temperature is 97.6 F (36.4 C). His blood pressure is 163/82 (abnormal) and his pulse is 82. His respiration is 18 and oxygen saturation is 100%.   Wt Readings from Last 3 Encounters:  05/03/20 134 lb 4 oz (60.9 kg)  04/25/20 134 lb 3.2 oz (60.9 kg)  04/06/20 138 lb (62.6 kg)    Physical Exam Vitals reviewed.  HENT:     Head: Normocephalic and atraumatic.  Eyes:     Pupils: Pupils are equal, round, and reactive to light.  Cardiovascular:     Rate and Rhythm: Normal rate and regular rhythm.     Heart sounds: Normal heart sounds.  Pulmonary:     Effort: Pulmonary effort is normal.     Breath sounds: Normal breath sounds.  Abdominal:     General: Bowel sounds are normal.     Palpations: Abdomen is soft.   Musculoskeletal:        General: No tenderness or deformity. Normal range of motion.     Cervical back: Normal range of motion.  Lymphadenopathy:     Cervical: No cervical adenopathy.  Skin:    General: Skin is warm and dry.     Findings: No erythema or rash.  Neurological:     Mental Status: He is alert and oriented to person, place, and time.  Psychiatric:        Behavior: Behavior normal.        Thought Content: Thought content normal.        Judgment: Judgment normal.      Lab Results  Component Value Date   WBC 4.5 05/03/2020   HGB 9.3 (L) 05/03/2020   HCT 29.2 (L) 05/03/2020   MCV 91.5 05/03/2020   PLT 145 (L) 05/03/2020   Lab Results  Component Value Date   FERRITIN 501 (H) 04/06/2020   IRON 58 04/06/2020   TIBC 225 04/06/2020   UIBC 167 04/06/2020   IRONPCTSAT 26 04/06/2020   Lab Results  Component Value Date   RETICCTPCT 1.1 04/06/2020   RBC 3.19 (L) 05/03/2020   Lab Results  Component Value Date   KPAFRELGTCHN 1.9 (L) 04/06/2020   LAMBDASER 18.5 04/06/2020   KAPLAMBRATIO 0.10 (L) 04/06/2020   Lab Results  Component Value Date   IGGSERUM 210 (L) 04/06/2020   IGA <5 (L) 04/06/2020   IGMSERUM 27 04/06/2020   Lab Results  Component Value Date   TOTALPROTELP 5.0 (L) 04/06/2020   ALBUMINELP 2.6 (L) 04/06/2020   A1GS 0.3 04/06/2020   A2GS 1.0 04/06/2020   BETS 0.7 04/06/2020   BETA2SER 0.3 07/28/2015   GAMS 0.3 (L) 04/06/2020   MSPIKE 0.1 (H) 04/06/2020   SPEI Comment 04/06/2020     Chemistry      Component Value Date/Time   NA 141 05/03/2020 0858   NA 146 (H) 08/07/2017 1153   NA 141 01/09/2017 1004   K 3.9 05/03/2020 0858   K 4.6 08/07/2017 1153   K 4.4 01/09/2017 1004   CL 106 05/03/2020 0858   CL 108 08/07/2017 1153   CO2 27 05/03/2020 0858   CO2 26 08/07/2017 1153   CO2 22 01/09/2017 1004   BUN 44 (H) 05/03/2020 0858   BUN 24 (H) 08/07/2017 1153   BUN 23.9 01/09/2017 1004   CREATININE 1.57 (H) 05/03/2020 0858   CREATININE 1.56  (H) 05/26/2018 1508   CREATININE 1.5 (H) 01/09/2017 1004      Component Value Date/Time   CALCIUM 8.9 05/03/2020 0858   CALCIUM 8.8 08/07/2017 1153   CALCIUM 8.8 01/09/2017 1004   ALKPHOS 51 05/03/2020 0858   ALKPHOS 97 (H) 08/07/2017 1153   ALKPHOS 80 01/09/2017 1004   AST 24 05/03/2020 0858   AST 18 01/09/2017 1004   ALT 13 05/03/2020 0858   ALT 20 08/07/2017 1153   ALT 12 01/09/2017 1004   BILITOT 0.5 05/03/2020 0858   BILITOT 0.92 01/09/2017 1004       Impression and Plan: Mr. Magowan is a very pleasant 86yo caucasian gentleman with relapsed IgG lambda myeloma. We really follow his lambda light chain as a measure of response.   I am just happy that he is responding to the bendamustine.    I just am not sure what we can really do about the weight loss.  I told him to increase the amount of Ensure/Boost that he takes.  He will see our nutrition specialist.  Maybe she can help.  I just will continue with the bendamustine along with the monthly IVIG.   We will still follow him up monthly.  If he has any problems between now in 1 month, we will be more than happy to get him back for follow-up.  If his weight continues to drop, this will clearly impact his quality of life and his prognosis.  It is possible that he just may not be able to gain weight.  I know his body has been through quite a bit.  This is may be an indicator that his body is just getting "tired."  [name], MD 9/22/20219:37 AM

## 2020-05-03 NOTE — Telephone Encounter (Signed)
Appointments scheduled updated calendar printed .  D2 of treatments were removed per verbal from Dr Marin [facility]p [facility] 9/22 los

## 2020-05-03 NOTE — Progress Notes (Signed)
Dr. Marin Olp would like for patient to receive Bendeka on day 1 only. Day 2 deleted from careplan per his instructions. He will continue monthly IVIG.

## 2020-05-03 NOTE — Patient Instructions (Signed)
[facility] Discharge Instructions for Patients Receiving Chemotherapy  Today you received the following chemotherapy agents Bendeka.   To help prevent nausea and vomiting after your treatment, we encourage you to take your nausea medication as directed.    If you develop nausea and vomiting that is not controlled by your nausea medication, call the clinic.   BELOW ARE SYMPTOMS THAT SHOULD BE REPORTED IMMEDIATELY:  *FEVER GREATER THAN 100.5 F  *CHILLS WITH OR WITHOUT FEVER  NAUSEA AND VOMITING THAT IS NOT CONTROLLED WITH YOUR NAUSEA MEDICATION  *UNUSUAL SHORTNESS OF BREATH  *UNUSUAL BRUISING OR BLEEDING  TENDERNESS IN MOUTH AND THROAT WITH OR WITHOUT PRESENCE OF ULCERS  *URINARY PROBLEMS  *BOWEL PROBLEMS  UNUSUAL RASH Items with * indicate a potential emergency and should be followed up as soon as possible.  Feel free to call the clinic you have any questions or concerns. The clinic phone number is [phone].  Please show the CHEMO ALERT CARD at check-in to the Emergency Department and triage nurse.   

## 2020-05-04 ENCOUNTER — Other Ambulatory Visit: Payer: Self-pay | Admitting: Hematology & Oncology

## 2020-05-04 ENCOUNTER — Other Ambulatory Visit: Payer: Self-pay | Admitting: Internal Medicine

## 2020-05-04 ENCOUNTER — Telehealth: Payer: Self-pay | Admitting: Hematology & Oncology

## 2020-05-04 ENCOUNTER — Inpatient Hospital Stay: Payer: Medicare HMO

## 2020-05-04 LAB — KAPPA/LAMBDA LIGHT CHAINS
Kappa free light chain: 5 mg/L (ref 3.3–19.4)
Kappa, lambda light chain ratio: 0.31 (ref 0.26–1.65)
Lambda free light chains: 16 mg/L (ref 5.7–26.3)

## 2020-05-04 LAB — IGG, IGA, IGM
IgA: 6 mg/dL — ABNORMAL LOW (ref 61–437)
IgG (Immunoglobin G), Serum: 195 mg/dL — ABNORMAL LOW (ref 603–1613)
IgM (Immunoglobulin M), Srm: 95 mg/dL (ref 15–143)

## 2020-05-04 NOTE — Telephone Encounter (Signed)
Appointments scheduled calendar printed & mailed per 9/22 los

## 2020-05-05 ENCOUNTER — Ambulatory Visit: Payer: Medicare HMO

## 2020-05-05 LAB — PROTEIN ELECTROPHORESIS, SERUM, WITH REFLEX
A/G Ratio: 1.2 (ref 0.7–1.7)
Albumin ELP: 2.6 g/dL — ABNORMAL LOW (ref 2.9–4.4)
Alpha-1-Globulin: 0.2 g/dL (ref 0.0–0.4)
Alpha-2-Globulin: 0.9 g/dL (ref 0.4–1.0)
Beta Globulin: 0.6 g/dL — ABNORMAL LOW (ref 0.7–1.3)
Gamma Globulin: 0.3 g/dL — ABNORMAL LOW (ref 0.4–1.8)
Globulin, Total: 2.1 g/dL — ABNORMAL LOW (ref 2.2–3.9)
M-Spike, %: 0.2 g/dL — ABNORMAL HIGH
SPEP Interpretation: 0
Total Protein ELP: 4.7 g/dL — ABNORMAL LOW (ref 6.0–8.5)

## 2020-05-05 LAB — IMMUNOFIXATION REFLEX, SERUM
IgA: 6 mg/dL — ABNORMAL LOW (ref 61–437)
IgG (Immunoglobin G), Serum: 191 mg/dL — ABNORMAL LOW (ref 603–1613)
IgM (Immunoglobulin M), Srm: 94 mg/dL (ref 15–143)

## 2020-05-09 ENCOUNTER — Encounter: Payer: Medicare HMO | Admitting: Nutrition

## 2020-05-09 ENCOUNTER — Telehealth: Payer: Self-pay | Admitting: Nutrition

## 2020-05-09 NOTE — Telephone Encounter (Signed)
Telephone follow up completed with patient. Patient is being treated for Multiple Myeloma and is followed by Dr. Marin Olp. Weight has been decreasing. Patient weighed 134.25 pounds on September 22, decreased from 140 pounds March 15, 2020. He reports his weight in now below 130 pounds. Patient has poor appetite. He is on 5 mg Marinol BID. Noted labs Glucose 131, BUN 44, and Creatinine 1.57. Reports early satiety. He has bowel incontinence about 1 time daily. Reports taking Immodium. He is drinking 2 Boost daily.  Nutrition Diagnosis: Malnutrition continues.  Intervention: Educated patient to increase Boost to Boost Plus and increase to three bottles daily. (360 calories, 14 grams protein each) Reviewed strategies to increase calories. Encouraged patient to eat very 2 hours.   Monitoring, Evaluation, Goals: Patient will increase calories and protein to promote weight gain.  Next Visit: Monday, October 25, at 3:15 at [facility].

## 2020-05-10 ENCOUNTER — Telehealth: Payer: Self-pay | Admitting: *Deleted

## 2020-05-10 NOTE — Telephone Encounter (Signed)
Message received from patient with questions regarding his schedule.  Call placed back to patient and schedule reviewed with patient.  Pt appreciative of assistance and has no further questions at this time.

## 2020-05-18 ENCOUNTER — Telehealth: Payer: Self-pay | Admitting: Nurse Practitioner

## 2020-05-18 ENCOUNTER — Telehealth: Payer: Self-pay | Admitting: *Deleted

## 2020-05-18 NOTE — Telephone Encounter (Signed)
Call received from patient stating that he is having "incontinent loose stools which are not relieved with Imodium" and would like to know what to do.  Call placed back to patient and patient notified to contact Dr. Vivia Ewing office regarding loose stools.  Pt states that he will contact Dr. Vivia Ewing office now.

## 2020-05-19 LAB — CUP PACEART REMOTE DEVICE CHECK
Date Time Interrogation Session: 20211006230426
Implantable Pulse Generator Implant Date: 20200909

## 2020-05-19 NOTE — Telephone Encounter (Signed)
Advised. Check with patient next week to inquire about his response to Imodium.

## 2020-05-19 NOTE — Telephone Encounter (Signed)
Given the negative infectious work-up, and seemingly good response to Imodium, I think it is reasonable to trial 1 Imodium daily to see if this improves stool consistency and urgency.  Can also add fiber supplement to his boost to see if that can bulk up his stool a little more.  Depending on response, another consideration would be using Motofen, but will certainly stick with the Imodium for now.

## 2020-05-19 NOTE — Telephone Encounter (Signed)
Spoke with this very nice patient. He has been dealing with loose stools for over a month now. The Protonix was initially thought to be responsible and was decreased from BID to daily. The GI pathogen panel was negative for any infection. The patient has been afebrile through out this issue. He has malnutrition and weight loss. He drinks 3 Boosts each daily and is on Dronabinol.  The patient has incontinence of his stool as well as urgency of stooling. He will take an Imodium on some days and admits this does help. He is unable to tell how long the improvement lasts. He does not take Imodium everyday. Please advise.

## 2020-05-23 ENCOUNTER — Other Ambulatory Visit: Payer: Self-pay | Admitting: Hematology & Oncology

## 2020-05-23 DIAGNOSIS — R634 Abnormal weight loss: Secondary | ICD-10-CM

## 2020-05-23 DIAGNOSIS — C9002 Multiple myeloma in relapse: Secondary | ICD-10-CM

## 2020-05-23 DIAGNOSIS — R112 Nausea with vomiting, unspecified: Secondary | ICD-10-CM

## 2020-05-24 ENCOUNTER — Ambulatory Visit: Payer: Medicare HMO | Admitting: Internal Medicine

## 2020-05-24 NOTE — Telephone Encounter (Signed)
Spoke with spouse. She states he is responding well with daily Imodium. She will have him call with any further concerns.

## 2020-05-29 ENCOUNTER — Ambulatory Visit (INDEPENDENT_AMBULATORY_CARE_PROVIDER_SITE_OTHER): Payer: Medicare HMO

## 2020-05-29 DIAGNOSIS — I63412 Cerebral infarction due to embolism of left middle cerebral artery: Secondary | ICD-10-CM | POA: Diagnosis not present

## 2020-05-31 ENCOUNTER — Ambulatory Visit: Payer: Medicare HMO | Admitting: Family

## 2020-05-31 ENCOUNTER — Ambulatory Visit: Payer: Medicare HMO

## 2020-05-31 ENCOUNTER — Other Ambulatory Visit: Payer: Medicare HMO

## 2020-06-05 ENCOUNTER — Other Ambulatory Visit: Payer: Self-pay

## 2020-06-05 ENCOUNTER — Inpatient Hospital Stay: Payer: Medicare HMO | Attending: Hematology & Oncology | Admitting: Nutrition

## 2020-06-05 DIAGNOSIS — D6481 Anemia due to antineoplastic chemotherapy: Secondary | ICD-10-CM | POA: Insufficient documentation

## 2020-06-05 DIAGNOSIS — N289 Disorder of kidney and ureter, unspecified: Secondary | ICD-10-CM | POA: Insufficient documentation

## 2020-06-05 DIAGNOSIS — R5383 Other fatigue: Secondary | ICD-10-CM | POA: Insufficient documentation

## 2020-06-05 DIAGNOSIS — Z79899 Other long term (current) drug therapy: Secondary | ICD-10-CM | POA: Insufficient documentation

## 2020-06-05 DIAGNOSIS — Z5111 Encounter for antineoplastic chemotherapy: Secondary | ICD-10-CM | POA: Insufficient documentation

## 2020-06-05 DIAGNOSIS — Z882 Allergy status to sulfonamides status: Secondary | ICD-10-CM | POA: Insufficient documentation

## 2020-06-05 DIAGNOSIS — G629 Polyneuropathy, unspecified: Secondary | ICD-10-CM | POA: Insufficient documentation

## 2020-06-05 DIAGNOSIS — C9002 Multiple myeloma in relapse: Secondary | ICD-10-CM | POA: Insufficient documentation

## 2020-06-05 DIAGNOSIS — Z923 Personal history of irradiation: Secondary | ICD-10-CM | POA: Insufficient documentation

## 2020-06-05 NOTE — Progress Notes (Signed)
Nutrition follow-up completed with patient and his grandson.  Patient is followed by Dr. Marin Olp for multiple myeloma. Weight decreased and documented as 129.4 pounds from 134.25 pounds September 22. Patient continues to report poor appetite despite Marinol twice daily. He reports nothing tastes very good to him. He is very conscious about calories and feels like he is eating at least 300 cal at mealtimes. He has been drinking boost plus at least 2 bottles daily. He continues to have bowel incontinence and multiple stools although he denies diarrhea.  Nutrition diagnosis: Malnutrition continues.  Intervention: Educated patient to drink boost plus, 1 carton 3 times daily between meals.  Provided coupons. Reviewed high-calorie meals and provided fact sheets and suggestions. Enforced importance of eating something with calories and protein every 2-3 hours. Discuss Marinol and Imodium with MD.  Monitoring, evaluation, goals: Patient will work to increase calories and protein to minimize further weight loss.  Next visit: To be scheduled as needed per patient.  **Disclaimer: This note was dictated with voice recognition software. Similar sounding words can inadvertently be transcribed and this note may contain transcription errors which may not have been corrected upon publication of note.**

## 2020-06-05 NOTE — Progress Notes (Signed)
Carelink Summary Report / Loop Recorder 

## 2020-06-07 ENCOUNTER — Ambulatory Visit (INDEPENDENT_AMBULATORY_CARE_PROVIDER_SITE_OTHER): Payer: Medicare HMO | Admitting: Internal Medicine

## 2020-06-07 ENCOUNTER — Encounter: Payer: Self-pay | Admitting: Internal Medicine

## 2020-06-07 ENCOUNTER — Other Ambulatory Visit: Payer: Self-pay

## 2020-06-07 ENCOUNTER — Other Ambulatory Visit: Payer: Self-pay | Admitting: Internal Medicine

## 2020-06-07 VITALS — BP 130/83 | HR 86 | Temp 97.4°F | Ht 70.0 in | Wt 129.0 lb

## 2020-06-07 DIAGNOSIS — H01146 Xeroderma of left eye, unspecified eyelid: Secondary | ICD-10-CM

## 2020-06-07 DIAGNOSIS — M272 Inflammatory conditions of jaws: Secondary | ICD-10-CM

## 2020-06-07 DIAGNOSIS — C9002 Multiple myeloma in relapse: Secondary | ICD-10-CM

## 2020-06-07 MED ORDER — EYE LUBRICANT OP OINT: 1.0000 "application " | TOPICAL_OINTMENT | Freq: Two times a day (BID) | OPHTHALMIC | 0 refills | Status: DC

## 2020-06-07 NOTE — Progress Notes (Signed)
RFV: follow up for chronic jaw osteo  Patient ID: Christian Burns, male   DOB: 1930-01-22, 86 y.o.   MRN: [id number]  HPI Christian Burns is here for follow up visit with his grandson, he reports that he is having some Unintentional weight loss- saw nutritionist this past week. Using boost 3 x per day in between meals. Since he has lost his appetite.  Now at [facility]. - slow eater. Careful swallowing.   Teeth pain is worsening, so he is having root canal soon. - dr Buelah Manis. Osteomyelitis is status quo. Dentist still notes sees visible bone to watch for any worsening. He continues to take penicillin without difficulty  Who endodontist Dr Margit Banda; Dr Mango/Dr fuller. Sees dr Marin Olp next week as well  Outpatient Encounter Medications as of 06/07/2020  Medication Sig   amLODipine-benazepril (LOTREL) 5-10 MG capsule TAKE ONE CAPSULE EACH DAY   aspirin EC 81 MG tablet Take 1 tablet (81 mg total) by mouth daily. Swallow whole.   dronabinol (MARINOL) 5 MG capsule TAKE ONE CAPSULE TWICE A DAY BEFORE A MEAL   DULoxetine (CYMBALTA) 60 MG capsule Take 1 capsule (60 mg total) by mouth daily.   finasteride (PROSCAR) 5 MG tablet TAKE ONE TABLET EACH DAY (Patient taking differently: Take 5 mg by mouth daily. )   metoprolol succinate (TOPROL-XL) 25 MG 24 hr tablet Take 25 mg by mouth daily.   Multiple Vitamins-Minerals (QC MULTI-VITE 50 & OVER PO) TAKE ONE TABLET DAILY   ondansetron (ZOFRAN) 4 MG tablet Take 1 tablet (4 mg total) by mouth every 6 (six) hours as needed for nausea.   pantoprazole (PROTONIX) 40 MG tablet Take 1 tablet (40 mg total) by mouth 2 (two) times daily.   penicillin v potassium (VEETID) 500 MG tablet TAKE ONE TABLET FOUR TIMES DAILY   polyvinyl alcohol (LIQUIFILM TEARS) 1.4 % ophthalmic solution Place 1 drop into both eyes as needed for dry eyes.   predniSONE (DELTASONE) 20 MG tablet TAKE ONE TABLET EACH DAY WITH BREAKFAST   Probiotic Product (PROBIOTIC PO) Take 1  capsule by mouth daily.   pyridOXINE (VITAMIN B-6) 100 MG tablet Take 100 mg by mouth daily.    traMADol (ULTRAM) 50 MG tablet Take 1 tablet (50 mg total) by mouth every 8 (eight) hours as needed for moderate pain.   vitamin B-12 (CYANOCOBALAMIN) 500 MCG tablet Take 500 mcg by mouth daily.   Vitamin D, Ergocalciferol, (DRISDOL) 50000 UNITS CAPS capsule Take 50,000 Units by mouth every Sunday.    lidocaine-prilocaine (EMLA) cream Apply 1 application topically as needed. (Patient not taking: Reported on 06/07/2020)   No facility-administered encounter medications on file as of 06/07/2020.     Patient Active Problem List   Diagnosis Date Noted   SIRS (systemic inflammatory response syndrome) ([place]) 09/28/2019   Multifocal pneumonia 09/28/2019   Anemia associated with chemotherapy 09/28/2019   Protein-calorie malnutrition, severe 06/24/2019   Leukopenia due to antineoplastic chemotherapy ([place]) 06/23/2019   Nausea 06/23/2019   Diarrhea 06/23/2019   CKD (chronic kidney disease), stage III ([place]) 06/23/2019   HCAP (healthcare-associated pneumonia) 06/22/2019   Hyperlipidemia LDL goal <70 04/20/2019   Essential hypertension 04/20/2019   Advanced age 52/03/2019   Stroke ([place]) 04/19/2019   Community acquired pneumonia 03/03/2019   AKI (acute kidney injury) ([place]) 03/03/2019   Normochromic normocytic anemia 03/03/2019   Goals of care, counseling/discussion 02/04/2019   Acute osteomyelitis of jaw 11/26/2018   Bacteremia    History of total right hip replacement 10/07/2018  Sepsis due to undetermined organism ([place])    Left lower lobe pneumonia 10/06/2018   Right hip pain 09/01/2018   Iron deficiency anemia secondary to inadequate dietary iron intake 07/18/2016   Multiple myeloma in relapse ([place]) 04/05/2016   Humoral hypercalcemia of malignancy 10/02/2015   Multiple myeloma in remission ([place]) 09/08/2015   Myeloma ([place]) 08/23/2011     Health Maintenance Due    Topic Date Due   COVID-19 Vaccine (1) Never done   TETANUS/TDAP  Never done   PNA vac Low Risk Adult (1 of 2 - PCV13) Never done    Soc hx: no smoking or drinking Review of Systems 12 point ros is negative except what is mentioned above Physical Exam   BP 130/83    Pulse 86    Temp (!) 97.4 F (36.3 C) (Oral)    Ht 5' 10"  (1.778 m)    Wt 129 lb (58.5 kg)    SpO2 97%    BMI 18.51 kg/m   Physical Exam  Constitutional: He is oriented to person, place, and time. He appears well-developed and well-nourished. No distress.  HENT: poor dentition Mouth/Throat: Oropharynx is clear and moist. No oropharyngeal exudate.  Psychiatric: He has a normal mood and affect. His behavior is normal.    CBC Lab Results  Component Value Date   WBC 4.5 05/03/2020   RBC 3.19 (L) 05/03/2020   HGB 9.3 (L) 05/03/2020   HCT 29.2 (L) 05/03/2020   PLT 145 (L) 05/03/2020   MCV 91.5 05/03/2020   MCH 29.2 05/03/2020   MCHC 31.8 05/03/2020   RDW 16.2 (H) 05/03/2020   LYMPHSABS 0.1 (L) 05/03/2020   MONOABS 0.5 05/03/2020   EOSABS 0.0 05/03/2020    BMET Lab Results  Component Value Date   NA 141 05/03/2020   K 3.9 05/03/2020   CL 106 05/03/2020   CO2 27 05/03/2020   GLUCOSE 131 (H) 05/03/2020   BUN 44 (H) 05/03/2020   CREATININE 1.57 (H) 05/03/2020   CALCIUM 8.9 05/03/2020   GFRNONAA 39 (L) 05/03/2020   GFRAA 45 (L) 05/03/2020   Lab Results  Component Value Date   ESRSEDRATE 50 (H) 06/08/2020   Lab Results  Component Value Date   CRP 0.8 06/08/2020      Assessment and Plan Chronic jaw osteomyelitis = still planning to continue on chronic suppression with penicillin. Will check Sed rate and crp  Left eyelid dryness = will prescribe Eye lubricant to minimize risk of corneal abrasian  Hx of multiple myeloma = follows up with dr Marin Olp this coming week

## 2020-06-07 NOTE — Patient Instructions (Signed)
Please ask dr Marin Olp to also get sed rate and c-reactive protein for labs tomorrow

## 2020-06-08 ENCOUNTER — Other Ambulatory Visit: Payer: Self-pay | Admitting: *Deleted

## 2020-06-08 ENCOUNTER — Encounter: Payer: Self-pay | Admitting: Family

## 2020-06-08 ENCOUNTER — Inpatient Hospital Stay: Payer: Medicare HMO

## 2020-06-08 ENCOUNTER — Inpatient Hospital Stay (HOSPITAL_BASED_OUTPATIENT_CLINIC_OR_DEPARTMENT_OTHER): Payer: Medicare HMO | Admitting: Family

## 2020-06-08 VITALS — Wt 129.0 lb

## 2020-06-08 VITALS — BP 151/71 | HR 73 | Temp 97.9°F | Resp 17

## 2020-06-08 DIAGNOSIS — M869 Osteomyelitis, unspecified: Secondary | ICD-10-CM

## 2020-06-08 DIAGNOSIS — Z95828 Presence of other vascular implants and grafts: Secondary | ICD-10-CM

## 2020-06-08 DIAGNOSIS — R7881 Bacteremia: Secondary | ICD-10-CM

## 2020-06-08 DIAGNOSIS — R634 Abnormal weight loss: Secondary | ICD-10-CM

## 2020-06-08 DIAGNOSIS — D508 Other iron deficiency anemias: Secondary | ICD-10-CM

## 2020-06-08 DIAGNOSIS — C9002 Multiple myeloma in relapse: Secondary | ICD-10-CM

## 2020-06-08 DIAGNOSIS — C9001 Multiple myeloma in remission: Secondary | ICD-10-CM

## 2020-06-08 DIAGNOSIS — D649 Anemia, unspecified: Secondary | ICD-10-CM

## 2020-06-08 DIAGNOSIS — Z923 Personal history of irradiation: Secondary | ICD-10-CM | POA: Diagnosis not present

## 2020-06-08 DIAGNOSIS — D6481 Anemia due to antineoplastic chemotherapy: Secondary | ICD-10-CM | POA: Diagnosis not present

## 2020-06-08 DIAGNOSIS — C9000 Multiple myeloma not having achieved remission: Secondary | ICD-10-CM

## 2020-06-08 DIAGNOSIS — N289 Disorder of kidney and ureter, unspecified: Secondary | ICD-10-CM | POA: Diagnosis not present

## 2020-06-08 DIAGNOSIS — G620 Drug-induced polyneuropathy: Secondary | ICD-10-CM

## 2020-06-08 DIAGNOSIS — R5383 Other fatigue: Secondary | ICD-10-CM | POA: Diagnosis not present

## 2020-06-08 DIAGNOSIS — Z7189 Other specified counseling: Secondary | ICD-10-CM

## 2020-06-08 DIAGNOSIS — Z882 Allergy status to sulfonamides status: Secondary | ICD-10-CM | POA: Diagnosis not present

## 2020-06-08 DIAGNOSIS — G629 Polyneuropathy, unspecified: Secondary | ICD-10-CM | POA: Diagnosis not present

## 2020-06-08 DIAGNOSIS — Z5111 Encounter for antineoplastic chemotherapy: Secondary | ICD-10-CM | POA: Diagnosis present

## 2020-06-08 DIAGNOSIS — Z79899 Other long term (current) drug therapy: Secondary | ICD-10-CM | POA: Diagnosis not present

## 2020-06-08 LAB — CMP (CANCER CENTER ONLY)
ALT: 13 U/L (ref 0–44)
AST: 23 U/L (ref 15–41)
Albumin: 3.4 g/dL — ABNORMAL LOW (ref 3.5–5.0)
Alkaline Phosphatase: 49 U/L (ref 38–126)
Anion gap: 8 (ref 5–15)
BUN: 61 mg/dL — ABNORMAL HIGH (ref 8–23)
CO2: 24 mmol/L (ref 22–32)
Calcium: 9.4 mg/dL (ref 8.9–10.3)
Chloride: 105 mmol/L (ref 98–111)
Creatinine: 1.85 mg/dL — ABNORMAL HIGH (ref 0.61–1.24)
GFR, Estimated: 35 mL/min — ABNORMAL LOW (ref >60)
Glucose, Bld: 108 mg/dL — ABNORMAL HIGH (ref 70–99)
Potassium: 4.8 mmol/L (ref 3.5–5.1)
Sodium: 137 mmol/L (ref 135–145)
Total Bilirubin: 0.5 mg/dL (ref 0.3–1.2)
Total Protein: 5.7 g/dL — ABNORMAL LOW (ref 6.5–8.1)

## 2020-06-08 LAB — CBC WITH DIFFERENTIAL (CANCER CENTER ONLY)
Abs Immature Granulocytes: 0.3 10*3/uL — ABNORMAL HIGH (ref 0.00–0.07)
Basophils Absolute: 0 10*3/uL (ref 0.0–0.1)
Basophils Relative: 1 %
Eosinophils Absolute: 0 10*3/uL (ref 0.0–0.5)
Eosinophils Relative: 1 %
HCT: 29.3 % — ABNORMAL LOW (ref 39.0–52.0)
Hemoglobin: 9.6 g/dL — ABNORMAL LOW (ref 13.0–17.0)
Immature Granulocytes: 6 %
Lymphocytes Relative: 3 %
Lymphs Abs: 0.2 10*3/uL — ABNORMAL LOW (ref 0.7–4.0)
MCH: 30.4 pg (ref 26.0–34.0)
MCHC: 32.8 g/dL (ref 30.0–36.0)
MCV: 92.7 fL (ref 80.0–100.0)
Monocytes Absolute: 1.2 10*3/uL — ABNORMAL HIGH (ref 0.1–1.0)
Monocytes Relative: 23 %
Neutro Abs: 3.5 10*3/uL (ref 1.7–7.7)
Neutrophils Relative %: 66 %
Platelet Count: 151 10*3/uL (ref 150–400)
RBC: 3.16 MIL/uL — ABNORMAL LOW (ref 4.22–5.81)
RDW: 18.2 % — ABNORMAL HIGH (ref 11.5–15.5)
WBC Count: 5.3 10*3/uL (ref 4.0–10.5)
nRBC: 0 % (ref 0.0–0.2)

## 2020-06-08 LAB — SEDIMENTATION RATE: Sed Rate: 50 mm/hr — ABNORMAL HIGH (ref 0–16)

## 2020-06-08 LAB — C-REACTIVE PROTEIN: CRP: 0.8 mg/dL (ref <1.0)

## 2020-06-08 LAB — LACTATE DEHYDROGENASE: LDH: 332 U/L — ABNORMAL HIGH (ref 98–192)

## 2020-06-08 MED ORDER — FAMOTIDINE IN NACL 20-0.9 MG/50ML-% IV SOLN
20.0000 mg | Freq: Once | INTRAVENOUS | Status: AC
  Administered 2020-06-08: 20 mg via INTRAVENOUS

## 2020-06-08 MED ORDER — ACETAMINOPHEN 325 MG PO TABS
ORAL_TABLET | ORAL | Status: AC
  Filled 2020-06-08: qty 2

## 2020-06-08 MED ORDER — PALONOSETRON HCL INJECTION 0.25 MG/5ML
INTRAVENOUS | Status: AC
  Filled 2020-06-08: qty 5

## 2020-06-08 MED ORDER — SODIUM CHLORIDE 0.9 % IV SOLN
10.0000 mg | Freq: Once | INTRAVENOUS | Status: AC
  Administered 2020-06-08: 10 mg via INTRAVENOUS
  Filled 2020-06-08: qty 10

## 2020-06-08 MED ORDER — SODIUM CHLORIDE 0.9% FLUSH
10.0000 mL | Freq: Once | INTRAVENOUS | Status: DC | PRN
  Filled 2020-06-08: qty 10

## 2020-06-08 MED ORDER — DIPHENHYDRAMINE HCL 25 MG PO CAPS
25.0000 mg | ORAL_CAPSULE | Freq: Once | ORAL | Status: AC
  Administered 2020-06-08: 25 mg via ORAL

## 2020-06-08 MED ORDER — HEPARIN SOD (PORK) LOCK FLUSH 100 UNIT/ML IV SOLN
500.0000 [IU] | Freq: Once | INTRAVENOUS | Status: AC | PRN
  Administered 2020-06-08: 500 [IU]
  Filled 2020-06-08: qty 5

## 2020-06-08 MED ORDER — DIPHENHYDRAMINE HCL 25 MG PO CAPS
ORAL_CAPSULE | ORAL | Status: AC
  Filled 2020-06-08: qty 1

## 2020-06-08 MED ORDER — ACETAMINOPHEN 325 MG PO TABS
650.0000 mg | ORAL_TABLET | Freq: Once | ORAL | Status: AC
  Administered 2020-06-08: 650 mg via ORAL

## 2020-06-08 MED ORDER — DEXTROSE 5 % IV SOLN
INTRAVENOUS | Status: DC
  Filled 2020-06-08: qty 250

## 2020-06-08 MED ORDER — SODIUM CHLORIDE 0.9 % IV SOLN
20.0000 mg | Freq: Once | INTRAVENOUS | Status: DC
  Filled 2020-06-08: qty 2

## 2020-06-08 MED ORDER — SODIUM CHLORIDE 0.9% FLUSH
10.0000 mL | INTRAVENOUS | Status: DC | PRN
  Administered 2020-06-08: 10 mL
  Filled 2020-06-08: qty 10

## 2020-06-08 MED ORDER — SODIUM CHLORIDE 0.9 % IV SOLN
Freq: Once | INTRAVENOUS | Status: AC
  Filled 2020-06-08: qty 250

## 2020-06-08 MED ORDER — FAMOTIDINE IN NACL 20-0.9 MG/50ML-% IV SOLN
INTRAVENOUS | Status: AC
  Filled 2020-06-08: qty 50

## 2020-06-08 MED ORDER — PALONOSETRON HCL INJECTION 0.25 MG/5ML
0.2500 mg | Freq: Once | INTRAVENOUS | Status: AC
  Administered 2020-06-08: 0.25 mg via INTRAVENOUS

## 2020-06-08 MED ORDER — IMMUNE GLOBULIN (HUMAN) 20 GM/200ML IV SOLN
40.0000 g | Freq: Once | INTRAVENOUS | Status: AC
  Administered 2020-06-08: 40 g via INTRAVENOUS
  Filled 2020-06-08: qty 400

## 2020-06-08 MED ORDER — SODIUM CHLORIDE 0.9 % IV SOLN
75.0000 mg/m2 | Freq: Once | INTRAVENOUS | Status: AC
  Administered 2020-06-08: 125 mg via INTRAVENOUS
  Filled 2020-06-08: qty 5

## 2020-06-08 MED ORDER — SODIUM CHLORIDE 0.9 % IV SOLN
Freq: Once | INTRAVENOUS | Status: DC
  Filled 2020-06-08: qty 250

## 2020-06-08 NOTE — Patient Instructions (Signed)

## 2020-06-08 NOTE — Progress Notes (Signed)
MD reviewed CBC and CMET, VO " Ok to treat despite counts"

## 2020-06-08 NOTE — Progress Notes (Signed)
Pt discharged in no apparent distress. Pt left ambulatory with assistance- walker . Pt aware of discharge instructions and verbalized understanding and had no further questions.

## 2020-06-08 NOTE — Patient Instructions (Signed)
[facility] Discharge Instructions for Patients Receiving Chemotherapy  Today you received the following chemotherapy agents [place].   To help prevent nausea and vomiting after your treatment, we encourage you to take your nausea medication as directed.    If you develop nausea and vomiting that is not controlled by your nausea medication, call the clinic.   BELOW ARE SYMPTOMS THAT SHOULD BE REPORTED IMMEDIATELY:  *FEVER GREATER THAN 100.5 F  *CHILLS WITH OR WITHOUT FEVER  NAUSEA AND VOMITING THAT IS NOT CONTROLLED WITH YOUR NAUSEA MEDICATION  *UNUSUAL SHORTNESS OF BREATH  *UNUSUAL BRUISING OR BLEEDING  TENDERNESS IN MOUTH AND THROAT WITH OR WITHOUT PRESENCE OF ULCERS  *URINARY PROBLEMS  *BOWEL PROBLEMS  UNUSUAL RASH Items with * indicate a potential emergency and should be followed up as soon as possible.  Feel free to call the clinic you have any questions or concerns. The clinic phone number is [phone].  Please show the CHEMO ALERT CARD at check-in to the Emergency Department and triage nurse.   

## 2020-06-08 NOTE — Progress Notes (Signed)
Hematology and Oncology Follow Up Visit  Christian Burns [id number] 28-Mar-1930 86 y.o. 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 Palliative radiation therapy to right ilium Kyprolis/Cytoxanq 3 week dosing- s/p cycle 24 (Cytoxan restarted on cycle 13) - DC'd due to progression Radiation therapy to right femur, 10 fraction --completed 3000 rad on 11/06/2018 Elotuzomab - started 10/29/2018 -- s/p cycle #1 -- d/c on 12/24/2018 Pomalidomide 2 mg po q day (21 on/7 off) - started 10/29/2018 -- d/c 12/31/2018 Daratumumab - start cycle # 1 on 12/31/2018 -- d/c on -02/04/2019  Current Therapy: Bendamustine/Decadron -- started on 02/10/2019, s/p cycle15 -- Bendamustine given just 1 day Zometa IV q 4 weeks - on hold Retacritneeded for hemoglobin less than 10 IVIG 40g IV q month -- start on 07/2019   Interim History:  Christian Burns is here today for follow-up and treatment. He is doing fairly well but stays fatigued.  Last month, M-spike was 0.2 g/dL, IgG level was 195 mg/dL and lambda light chains 1.60 mg/dL.  He had another Moh's surgery under the left eye last week on Thursday and has some puffiness and bruising under the eye. The incision appears to have healed nicely. No redness or edema indicative of infection noted on exam. He states that he will have 2 more Moh's procedures in January 2022.  He does not have much of an appetite and weight is down 5 lbs. He states that he has not been taking his Marinol at times but is taking the prednisone every morning. He states that due to his chronic issue with swallowing he takes a long time to eat and will sometimes not finish his food.  He was able to meet with our  nutritionist yesterday and got some ideas on how to boost his calorie count.  No fever, chills, n/v, cough, rash, dizziness, SOB, chest pain, palpitations, abdominal pain or changes in bladder habits.  He is doing well on imodium and states that he is no longer having watery stools.  No episodes of blood loss. No abnormal bruising, no petechiae.  No swelling or tenderness in his extremities.  The neuropathy in his fingers and feet is stable.  He is ambulating with a walker for added support.  No falls or syncopal episodes to report.   ECOG Performance Status: 2 - Symptomatic, <50% confined to bed  Medications:  Allergies as of 06/08/2020      Reactions   Sulfa Antibiotics Itching   Sulfasalazine Itching      Medication List       Accurate as of June 08, 2020  9:57 AM. If you have any questions, ask your nurse or doctor.        amLODipine-benazepril 5-10 MG capsule Commonly known as: LOTREL TAKE ONE CAPSULE EACH DAY   aspirin EC 81 MG tablet Take 1 tablet (81 mg total) by mouth daily. Swallow whole.   dronabinol 5 MG capsule Commonly known as: MARINOL TAKE ONE CAPSULE TWICE A DAY BEFORE A MEAL   DULoxetine 60 MG capsule Commonly known as: CYMBALTA Take 1 capsule (60 mg total) by mouth daily.   Eye Lubricant Oint Apply 1 application to eye in the morning and at bedtime. If left eye lower eye lid is irritated  finasteride 5 MG tablet Commonly known as: PROSCAR TAKE ONE TABLET EACH DAY What changed: See the new instructions.   lidocaine-prilocaine cream Commonly known as: EMLA Apply 1 application topically as needed.   metoprolol succinate 25 MG 24 hr tablet Commonly known as: TOPROL-XL Take 25 mg by mouth daily.   ondansetron 4 MG tablet Commonly known as: ZOFRAN Take 1 tablet (4 mg total) by mouth every 6 (six) hours as needed for nausea.   pantoprazole 40 MG tablet Commonly known as: Protonix Take 1 tablet (40 mg total) by mouth 2 (two) times daily.     penicillin v potassium 500 MG tablet Commonly known as: VEETID TAKE ONE TABLET FOUR TIMES DAILY   polyvinyl alcohol 1.4 % ophthalmic solution Commonly known as: LIQUIFILM TEARS Place 1 drop into both eyes as needed for dry eyes.   predniSONE 20 MG tablet Commonly known as: DELTASONE TAKE ONE TABLET EACH DAY WITH BREAKFAST   PROBIOTIC PO Take 1 capsule by mouth daily.   pyridOXINE 100 MG tablet Commonly known as: VITAMIN B-6 Take 100 mg by mouth daily.   QC MULTI-VITE 50 & OVER PO TAKE ONE TABLET DAILY   traMADol 50 MG tablet Commonly known as: ULTRAM Take 1 tablet (50 mg total) by mouth every 8 (eight) hours as needed for moderate pain.   vitamin B-12 500 MCG tablet Commonly known as: CYANOCOBALAMIN Take 500 mcg by mouth daily.   Vitamin D (Ergocalciferol) 1.25 MG (50000 UNIT) Caps capsule Commonly known as: DRISDOL Take 50,000 Units by mouth every Sunday.       Allergies:  Allergies  Allergen Reactions  . Sulfa Antibiotics Itching  . Sulfasalazine Itching    Past Medical History, Surgical history, Social history, and Family History were reviewed and updated.  Review of Systems: All other 10 point review of systems is negative.   Physical Exam:  vitals were not taken for this visit.   Wt Readings from Last 3 Encounters:  06/07/20 129 lb (58.5 kg)  06/05/20 129 lb 6.4 oz (58.7 kg)  05/03/20 134 lb 4 oz (60.9 kg)    Ocular: Sclerae unicteric, pupils equal, round and reactive to light Ear-nose-throat: Oropharynx clear, dentition fair Lymphatic: No cervical or supraclavicular adenopathy Lungs no rales or rhonchi, good excursion bilaterally Heart regular rate and rhythm, no murmur appreciated Abd soft, nontender, positive bowel sounds MSK no focal spinal tenderness, no joint edema Neuro: non-focal, well-oriented, appropriate affect Breasts: Deferred   Lab Results  Component Value Date   WBC 4.5 05/03/2020   HGB 9.3 (L) 05/03/2020   HCT 29.2 (L)  05/03/2020   MCV 91.5 05/03/2020   PLT 145 (L) 05/03/2020   Lab Results  Component Value Date   FERRITIN 741 (H) 05/03/2020   IRON 58 04/06/2020   TIBC 225 04/06/2020   UIBC 167 04/06/2020   IRONPCTSAT 26 04/06/2020   Lab Results  Component Value Date   RETICCTPCT 1.1 04/06/2020   RBC 3.19 (L) 05/03/2020   Lab Results  Component Value Date   KPAFRELGTCHN 5.0 05/03/2020   LAMBDASER 16.0 05/03/2020   KAPLAMBRATIO 0.31 05/03/2020   Lab Results  Component Value Date   IGGSERUM 191 (L) 05/03/2020   IGA 6 (L) 05/03/2020   IGMSERUM 94 05/03/2020   Lab Results  Component Value Date   TOTALPROTELP 4.7 (L) 05/03/2020   ALBUMINELP 2.6 (L) 05/03/2020   A1GS 0.2 05/03/2020   A2GS 0.9 05/03/2020   BETS 0.6 (L) 05/03/2020   BETA2SER 0.3 07/28/2015  GAMS 0.3 (L) 05/03/2020   MSPIKE 0.2 (H) 05/03/2020   SPEI Comment 04/06/2020     Chemistry      Component Value Date/Time   NA 141 05/03/2020 0858   NA 146 (H) 08/07/2017 1153   NA 141 01/09/2017 1004   K 3.9 05/03/2020 0858   K 4.6 08/07/2017 1153   K 4.4 01/09/2017 1004   CL 106 05/03/2020 0858   CL 108 08/07/2017 1153   CO2 27 05/03/2020 0858   CO2 26 08/07/2017 1153   CO2 22 01/09/2017 1004   BUN 44 (H) 05/03/2020 0858   BUN 24 (H) 08/07/2017 1153   BUN 23.9 01/09/2017 1004   CREATININE 1.57 (H) 05/03/2020 0858   CREATININE 1.56 (H) 05/26/2018 1508   CREATININE 1.5 (H) 01/09/2017 1004      Component Value Date/Time   CALCIUM 8.9 05/03/2020 0858   CALCIUM 8.8 08/07/2017 1153   CALCIUM 8.8 01/09/2017 1004   ALKPHOS 51 05/03/2020 0858   ALKPHOS 97 (H) 08/07/2017 1153   ALKPHOS 80 01/09/2017 1004   AST 24 05/03/2020 0858   AST 18 01/09/2017 1004   ALT 13 05/03/2020 0858   ALT 20 08/07/2017 1153   ALT 12 01/09/2017 1004   BILITOT 0.5 05/03/2020 0858   BILITOT 0.92 01/09/2017 1004       Impression and Plan: Mr. Spark is a very pleasant 86 yo caucasian gentleman with relapsed IgG lambda myeloma. We will  proceed with treatment today as well as IVIG.  He has his plan for increasing calorie count and will take his Marinol 30 minutes before his two largest meals each day.  Follow-up in 1 month.  He was encouraged to contact our office with any questions or concerns.   Laverna Peace, NP 10/28/20219:57 AM

## 2020-06-09 ENCOUNTER — Telehealth: Payer: Self-pay | Admitting: *Deleted

## 2020-06-09 ENCOUNTER — Telehealth: Payer: Self-pay

## 2020-06-09 LAB — IGG, IGA, IGM
IgA: 7 mg/dL — ABNORMAL LOW (ref 61–437)
IgG (Immunoglobin G), Serum: 457 mg/dL — ABNORMAL LOW (ref 603–1613)
IgM (Immunoglobulin M), Srm: 182 mg/dL — ABNORMAL HIGH (ref 15–143)

## 2020-06-09 LAB — KAPPA/LAMBDA LIGHT CHAINS
Kappa free light chain: 5.7 mg/L (ref 3.3–19.4)
Kappa, lambda light chain ratio: 0.35 (ref 0.26–1.65)
Lambda free light chains: 16.1 mg/L (ref 5.7–26.3)

## 2020-06-09 NOTE — Telephone Encounter (Signed)
Call received from patient stating that he had an incontinent, large, explosive, loose stool this morning and would like to know what he should do.  Pt states that he did take one Imodium after stool.  Pt instructed to take another Imodium and to contact Dr. Vivia Ewing office.  Pt states that he will contact Dr. Vivia Ewing office now.

## 2020-06-09 NOTE — Telephone Encounter (Signed)
Patient being seen at [facility]. He has admitted to the RN that he is experiencing diarrhea again. He took Imodium this morning as previously instructed by this office. She is letting us know that he is continuing to have this issue. Asks if we can follow up with him.  Acknowledged the request and will call the patient today.

## 2020-06-09 NOTE — Telephone Encounter (Signed)
Spoke with his wife. The patient is presently napping. He had not had any issues with diarrhea again until yesterday. Yesterday evening he has 2 large stools he described as diarrhea. This left him feeling tired and weak. He has complained of tiredness today as well. He took Imodium this morning as he has been doing. He has not had any changes in his diet. No new medications or recent antibiotics. He is afebrile and has not complained of any abdominal pain. Spouse notes he has not eaten much today but is taking in some calories as he is very conscientious about this. Agrees to monitor him and call us if he acutely worsens, has repeat episode of diarrhea or other concerns.  Is there anything else?

## 2020-06-09 NOTE — Telephone Encounter (Signed)
Beth, I agree with your plan. Push fluids. Bland diet. Start a probiotic ie: Florastor 1 po bid if not taking one. Call office Monday if no improvement. Thx

## 2020-06-12 LAB — PROTEIN ELECTROPHORESIS, SERUM, WITH REFLEX
A/G Ratio: 1.3 (ref 0.7–1.7)
Albumin ELP: 3.1 g/dL (ref 2.9–4.4)
Alpha-1-Globulin: 0.3 g/dL (ref 0.0–0.4)
Alpha-2-Globulin: 0.8 g/dL (ref 0.4–1.0)
Beta Globulin: 0.8 g/dL (ref 0.7–1.3)
Gamma Globulin: 0.5 g/dL (ref 0.4–1.8)
Globulin, Total: 2.3 g/dL (ref 2.2–3.9)
M-Spike, %: 0.3 g/dL — ABNORMAL HIGH
SPEP Interpretation: 0
Total Protein ELP: 5.4 g/dL — ABNORMAL LOW (ref 6.0–8.5)

## 2020-06-12 LAB — IMMUNOFIXATION REFLEX, SERUM
IgA: 7 mg/dL — ABNORMAL LOW (ref 61–437)
IgG (Immunoglobin G), Serum: 428 mg/dL — ABNORMAL LOW (ref 603–1613)
IgM (Immunoglobulin M), Srm: 185 mg/dL — ABNORMAL HIGH (ref 15–143)

## 2020-06-15 ENCOUNTER — Telehealth: Payer: Self-pay | Admitting: *Deleted

## 2020-06-15 NOTE — Telephone Encounter (Signed)
Call received from patient stating that he started with a cough last night, has a hoarse voice this AM d/t coughing and does not have a fever or chills. Pt states that he is scheduled to get his covid booster this morning at [facility] and would like to know if he should get it or not.  Pt notified per order of Dr. Marin Olp to hold on taking the Covid booster until he is feeling well.  Pt appreciative of assistance and has no further questions at this time.

## 2020-06-28 ENCOUNTER — Other Ambulatory Visit: Payer: Self-pay | Admitting: Family

## 2020-06-28 ENCOUNTER — Ambulatory Visit: Payer: Medicare HMO

## 2020-06-28 ENCOUNTER — Other Ambulatory Visit: Payer: Medicare HMO

## 2020-06-28 ENCOUNTER — Telehealth: Payer: Self-pay

## 2020-06-28 ENCOUNTER — Ambulatory Visit: Payer: Medicare HMO | Admitting: Hematology & Oncology

## 2020-06-28 NOTE — Telephone Encounter (Signed)
Called pt per inbasket message from vanessa to move appts up, sarah and chemo dble booked per vanessa due to long tx... AOM

## 2020-07-03 ENCOUNTER — Ambulatory Visit (INDEPENDENT_AMBULATORY_CARE_PROVIDER_SITE_OTHER): Payer: Medicare HMO

## 2020-07-03 DIAGNOSIS — I63412 Cerebral infarction due to embolism of left middle cerebral artery: Secondary | ICD-10-CM

## 2020-07-04 LAB — CUP PACEART REMOTE DEVICE CHECK
Date Time Interrogation Session: 20211121230531
Implantable Pulse Generator Implant Date: 20200909

## 2020-07-04 NOTE — Progress Notes (Signed)
Carelink Summary Report / Loop Recorder 

## 2020-07-05 ENCOUNTER — Other Ambulatory Visit: Payer: Medicare HMO

## 2020-07-05 ENCOUNTER — Inpatient Hospital Stay (HOSPITAL_BASED_OUTPATIENT_CLINIC_OR_DEPARTMENT_OTHER): Payer: Medicare HMO | Admitting: Family

## 2020-07-05 ENCOUNTER — Ambulatory Visit: Payer: Medicare HMO

## 2020-07-05 ENCOUNTER — Other Ambulatory Visit: Payer: Self-pay | Admitting: *Deleted

## 2020-07-05 ENCOUNTER — Inpatient Hospital Stay: Payer: Medicare HMO | Attending: Hematology & Oncology

## 2020-07-05 ENCOUNTER — Other Ambulatory Visit: Payer: Self-pay

## 2020-07-05 ENCOUNTER — Inpatient Hospital Stay: Payer: Medicare HMO

## 2020-07-05 ENCOUNTER — Encounter: Payer: Self-pay | Admitting: Family

## 2020-07-05 ENCOUNTER — Ambulatory Visit: Payer: Medicare HMO | Admitting: Family

## 2020-07-05 VITALS — BP 185/76 | HR 69 | Temp 97.6°F | Resp 19 | Ht 70.0 in | Wt 127.0 lb

## 2020-07-05 VITALS — BP 165/89 | HR 60 | Temp 98.0°F | Resp 16

## 2020-07-05 DIAGNOSIS — Z95828 Presence of other vascular implants and grafts: Secondary | ICD-10-CM

## 2020-07-05 DIAGNOSIS — D649 Anemia, unspecified: Secondary | ICD-10-CM

## 2020-07-05 DIAGNOSIS — C9002 Multiple myeloma in relapse: Secondary | ICD-10-CM | POA: Diagnosis not present

## 2020-07-05 DIAGNOSIS — Z882 Allergy status to sulfonamides status: Secondary | ICD-10-CM | POA: Insufficient documentation

## 2020-07-05 DIAGNOSIS — G629 Polyneuropathy, unspecified: Secondary | ICD-10-CM | POA: Insufficient documentation

## 2020-07-05 DIAGNOSIS — D508 Other iron deficiency anemias: Secondary | ICD-10-CM

## 2020-07-05 DIAGNOSIS — C9000 Multiple myeloma not having achieved remission: Secondary | ICD-10-CM

## 2020-07-05 DIAGNOSIS — N289 Disorder of kidney and ureter, unspecified: Secondary | ICD-10-CM | POA: Insufficient documentation

## 2020-07-05 DIAGNOSIS — T451X5A Adverse effect of antineoplastic and immunosuppressive drugs, initial encounter: Secondary | ICD-10-CM | POA: Insufficient documentation

## 2020-07-05 DIAGNOSIS — D6481 Anemia due to antineoplastic chemotherapy: Secondary | ICD-10-CM | POA: Insufficient documentation

## 2020-07-05 DIAGNOSIS — Z79899 Other long term (current) drug therapy: Secondary | ICD-10-CM | POA: Insufficient documentation

## 2020-07-05 DIAGNOSIS — Z923 Personal history of irradiation: Secondary | ICD-10-CM | POA: Insufficient documentation

## 2020-07-05 LAB — CBC WITH DIFFERENTIAL (CANCER CENTER ONLY)
Abs Immature Granulocytes: 0.27 10*3/uL — ABNORMAL HIGH (ref 0.00–0.07)
Basophils Absolute: 0 10*3/uL (ref 0.0–0.1)
Basophils Relative: 2 %
Eosinophils Absolute: 0.1 10*3/uL (ref 0.0–0.5)
Eosinophils Relative: 4 %
HCT: 25.5 % — ABNORMAL LOW (ref 39.0–52.0)
Hemoglobin: 8.6 g/dL — ABNORMAL LOW (ref 13.0–17.0)
Immature Granulocytes: 10 %
Lymphocytes Relative: 2 %
Lymphs Abs: 0.1 10*3/uL — ABNORMAL LOW (ref 0.7–4.0)
MCH: 30.7 pg (ref 26.0–34.0)
MCHC: 33.7 g/dL (ref 30.0–36.0)
MCV: 91.1 fL (ref 80.0–100.0)
Monocytes Absolute: 0.5 10*3/uL (ref 0.1–1.0)
Monocytes Relative: 17 %
Neutro Abs: 1.8 10*3/uL (ref 1.7–7.7)
Neutrophils Relative %: 65 %
Platelet Count: 135 10*3/uL — ABNORMAL LOW (ref 150–400)
RBC: 2.8 MIL/uL — ABNORMAL LOW (ref 4.22–5.81)
RDW: 17.6 % — ABNORMAL HIGH (ref 11.5–15.5)
WBC Count: 2.7 10*3/uL — ABNORMAL LOW (ref 4.0–10.5)
nRBC: 0 % (ref 0.0–0.2)

## 2020-07-05 LAB — IRON AND TIBC
Iron: 90 ug/dL (ref 42–163)
Saturation Ratios: 40 % (ref 20–55)
TIBC: 223 ug/dL (ref 202–409)
UIBC: 133 ug/dL (ref 117–376)

## 2020-07-05 LAB — CMP (CANCER CENTER ONLY)
ALT: 11 U/L (ref 0–44)
AST: 29 U/L (ref 15–41)
Albumin: 3.3 g/dL — ABNORMAL LOW (ref 3.5–5.0)
Alkaline Phosphatase: 41 U/L (ref 38–126)
Anion gap: 7 (ref 5–15)
BUN: 44 mg/dL — ABNORMAL HIGH (ref 8–23)
CO2: 25 mmol/L (ref 22–32)
Calcium: 9.1 mg/dL (ref 8.9–10.3)
Chloride: 106 mmol/L (ref 98–111)
Creatinine: 1.6 mg/dL — ABNORMAL HIGH (ref 0.61–1.24)
GFR, Estimated: 41 mL/min — ABNORMAL LOW (ref >60)
Glucose, Bld: 109 mg/dL — ABNORMAL HIGH (ref 70–99)
Potassium: 4.4 mmol/L (ref 3.5–5.1)
Sodium: 138 mmol/L (ref 135–145)
Total Bilirubin: 0.4 mg/dL (ref 0.3–1.2)
Total Protein: 5.8 g/dL — ABNORMAL LOW (ref 6.5–8.1)

## 2020-07-05 LAB — FERRITIN: Ferritin: 807 ng/mL — ABNORMAL HIGH (ref 24–336)

## 2020-07-05 LAB — LACTATE DEHYDROGENASE: LDH: 363 U/L — ABNORMAL HIGH (ref 98–192)

## 2020-07-05 LAB — PREPARE RBC (CROSSMATCH)

## 2020-07-05 MED ORDER — HEPARIN SOD (PORK) LOCK FLUSH 100 UNIT/ML IV SOLN
500.0000 [IU] | Freq: Once | INTRAVENOUS | Status: AC
  Administered 2020-07-05: 500 [IU] via INTRAVENOUS
  Filled 2020-07-05: qty 5

## 2020-07-05 MED ORDER — IMMUNE GLOBULIN (HUMAN) 20 GM/200ML IV SOLN
40.0000 g | Freq: Once | INTRAVENOUS | Status: AC
  Administered 2020-07-05: 40 g via INTRAVENOUS
  Filled 2020-07-05: qty 400

## 2020-07-05 MED ORDER — SODIUM CHLORIDE 0.9% FLUSH
10.0000 mL | Freq: Once | INTRAVENOUS | Status: AC
  Administered 2020-07-05: 10 mL via INTRAVENOUS
  Filled 2020-07-05: qty 10

## 2020-07-05 MED ORDER — DIPHENHYDRAMINE HCL 25 MG PO CAPS
ORAL_CAPSULE | ORAL | Status: AC
  Filled 2020-07-05: qty 1

## 2020-07-05 MED ORDER — ACETAMINOPHEN 325 MG PO TABS: 650.0000 mg | ORAL_TABLET | Freq: Once | ORAL | Status: DC

## 2020-07-05 MED ORDER — DEXTROSE 5 % IV SOLN
INTRAVENOUS | Status: DC
  Filled 2020-07-05 (×2): qty 250

## 2020-07-05 MED ORDER — SODIUM CHLORIDE 0.9% IV SOLUTION
250.0000 mL | Freq: Once | INTRAVENOUS | Status: DC
  Filled 2020-07-05: qty 250

## 2020-07-05 MED ORDER — EPOETIN ALFA-EPBX 40000 UNIT/ML IJ SOLN
40000.0000 [IU] | Freq: Once | INTRAMUSCULAR | Status: AC
  Administered 2020-07-05: 40000 [IU] via SUBCUTANEOUS

## 2020-07-05 MED ORDER — FAMOTIDINE IN NACL 20-0.9 MG/50ML-% IV SOLN
20.0000 mg | Freq: Once | INTRAVENOUS | Status: AC
  Administered 2020-07-05: 20 mg via INTRAVENOUS

## 2020-07-05 MED ORDER — ACETAMINOPHEN 325 MG PO TABS
ORAL_TABLET | ORAL | Status: AC
  Filled 2020-07-05: qty 2

## 2020-07-05 MED ORDER — DIPHENHYDRAMINE HCL 25 MG PO CAPS
25.0000 mg | ORAL_CAPSULE | Freq: Once | ORAL | Status: AC
  Administered 2020-07-05: 25 mg via ORAL

## 2020-07-05 MED ORDER — ACETAMINOPHEN 325 MG PO TABS
650.0000 mg | ORAL_TABLET | Freq: Once | ORAL | Status: AC
  Administered 2020-07-05: 650 mg via ORAL

## 2020-07-05 MED ORDER — EPOETIN ALFA-EPBX 40000 UNIT/ML IJ SOLN
INTRAMUSCULAR | Status: AC
  Filled 2020-07-05: qty 1

## 2020-07-05 MED ORDER — SODIUM CHLORIDE 0.9 % IV SOLN
20.0000 mg | Freq: Once | INTRAVENOUS | Status: AC
  Administered 2020-07-05: 20 mg via INTRAVENOUS
  Filled 2020-07-05: qty 2

## 2020-07-05 MED ORDER — DIPHENHYDRAMINE HCL 25 MG PO CAPS: 25.0000 mg | ORAL_CAPSULE | Freq: Once | ORAL | Status: DC

## 2020-07-05 MED ORDER — FAMOTIDINE IN NACL 20-0.9 MG/50ML-% IV SOLN
INTRAVENOUS | Status: AC
  Filled 2020-07-05: qty 50

## 2020-07-05 NOTE — Patient Instructions (Signed)

## 2020-07-05 NOTE — Patient Instructions (Signed)
https://www.[facility].org/donate-blood/blood-donation-process/what-happens-to-donated-blood/blood-transfusions/types-of-[email or web address].html"> https://www.hematology.org/education/patients/blood-basics/blood-safety-and-matching"> https://www.nhlbi.nih.gov/health-topics/blood-transfusion">  Blood Transfusion, Adult A blood transfusion is a procedure in which you receive blood or a type of blood cell (blood component) through an IV. You may need a blood transfusion when your blood level is low. This may result from a bleeding disorder, illness, injury, or surgery. The blood may come from a donor. You may also be able to donate blood for yourself (autologous blood donation) before a planned surgery. The blood given in a transfusion is made up of different blood components. You may receive:  Red blood cells. These carry oxygen to the cells in the body.  Platelets. These help your blood to clot.  Plasma. This is the liquid part of your blood. It carries proteins and other substances throughout the body.  White blood cells. These help you fight infections. If you have hemophilia or another clotting disorder, you may also receive other types of blood products. Tell a health care provider about:  Any blood disorders you have.  Any previous reactions you have had during a blood transfusion.  Any allergies you have.  All medicines you are taking, including vitamins, herbs, eye drops, creams, and over-the-counter medicines.  Any surgeries you have had.  Any medical conditions you have, including any recent fever or cold symptoms.  Whether you are pregnant or may be pregnant. What are the risks? Generally, this is a safe procedure. However, problems may occur.  The most common problems include: ? A mild allergic reaction, such as red, swollen areas of skin (hives) and itching. ? Fever or chills. This may be the body's response to new blood cells received. This may occur during or up to 4  hours after the transfusion.  More serious problems may include: ? Transfusion-associated circulatory overload (TACO), or too much fluid in the lungs. This may cause breathing problems. ? A serious allergic reaction, such as difficulty breathing or swelling around the face and lips. ? Transfusion-related acute lung injury (TRALI), which causes breathing difficulty and low oxygen in the blood. This can occur within hours of the transfusion or several days later. ? Iron overload. This can happen after receiving many blood transfusions over a period of time. ? Infection or virus being transmitted. This is rare because donated blood is carefully tested before it is given. ? Hemolytic transfusion reaction. This is rare. It happens when your body's defense system (immune system)tries to attack the new blood cells. Symptoms may include fever, chills, nausea, low blood pressure, and low back or chest pain. ? Transfusion-associated graft-versus-host disease (TAGVHD). This is rare. It happens when donated cells attack your body's healthy tissues. What happens before the procedure? Medicines Ask your health care provider about:  Changing or stopping your regular medicines. This is especially important if you are taking diabetes medicines or blood thinners.  Taking medicines such as aspirin and ibuprofen. These medicines can thin your blood. Do not take these medicines unless your health care provider tells you to take them.  Taking over-the-counter medicines, vitamins, herbs, and supplements. General instructions  Follow instructions from your health care provider about eating and drinking restrictions.  You will have a blood test to determine your blood type. This is necessary to know what kind of blood your body will accept and to match it to the donor blood.  If you are going to have a planned surgery, you may be able to do an autologous blood donation. This may be done in case you need to have a  transfusion.    You will have your temperature, blood pressure, and pulse monitored before the transfusion.  If you have had an allergic reaction to a transfusion in the past, you may be given medicine to help prevent a reaction. This medicine may be given to you by mouth (orally) or through an IV.  Set aside time for the blood transfusion. This procedure generally takes 1-4 hours to complete. What happens during the procedure?   An IV will be inserted into one of your veins.  The bag of donated blood will be attached to your IV. The blood will then enter through your vein.  Your temperature, blood pressure, and pulse will be monitored regularly during the transfusion. This monitoring is done to detect early signs of a transfusion reaction.  Tell your nurse right away if you have any of these symptoms during the transfusion: ? Shortness of breath or trouble breathing. ? Chest or back pain. ? Fever or chills. ? Hives or itching.  If you have any signs or symptoms of a reaction, your transfusion will be stopped and you may be given medicine.  When the transfusion is complete, your IV will be removed.  Pressure may be applied to the IV site for a few minutes.  A bandage (dressing)will be applied. The procedure may vary among health care providers and hospitals. What happens after the procedure?  Your temperature, blood pressure, pulse, breathing rate, and blood oxygen level will be monitored until you leave the hospital or clinic.  Your blood may be tested to see how you are responding to the transfusion.  You may be warmed with fluids or blankets to maintain a normal body temperature.  If you receive your blood transfusion in an outpatient setting, you will be told whom to contact to report any reactions. Where to find more information For more information on blood transfusions, visit the [employer]: redcross.org Summary  A blood transfusion is a procedure in which you  receive blood or a type of blood cell (blood component) through an IV.  The blood you receive may come from a donor or be donated by yourself (autologous blood donation) before a planned surgery.  The blood given in a transfusion is made up of different blood components. You may receive red blood cells, platelets, plasma, or white blood cells depending on the condition treated.  Your temperature, blood pressure, and pulse will be monitored before, during, and after the transfusion.  After the transfusion, your blood may be tested to see how your body has responded. This information is not intended to replace advice given to you by your health care provider. Make sure you discuss any questions you have with your health care provider. Document Revised: 01/21/2019 Document Reviewed: 01/21/2019 Elsevier Patient Education  [place]. Epoetin Alfa injection What is this medicine? EPOETIN ALFA (e POE e tin AL fa) helps your body make more red blood cells. This medicine is used to treat anemia caused by chronic kidney disease, cancer chemotherapy, or HIV-therapy. It may also be used before surgery if you have anemia. This medicine may be used for other purposes; ask your health care provider or pharmacist if you have questions. COMMON BRAND NAME(S): Epogen, Procrit, Retacrit What should I tell my health care provider before I take this medicine? They need to know if you have any of these conditions:  cancer  heart disease  high blood pressure  history of blood clots  history of stroke  low levels of folate, iron, or  vitamin B12 in the blood  seizures  an unusual or allergic reaction to erythropoietin, albumin, benzyl alcohol, hamster proteins, other medicines, foods, dyes, or preservatives  pregnant or trying to get pregnant  breast-feeding How should I use this medicine? This medicine is for injection into a vein or under the skin. It is usually given by a health care  professional in a hospital or clinic setting. If you get this medicine at home, you will be taught how to prepare and give this medicine. Use exactly as directed. Take your medicine at regular intervals. Do not take your medicine more often than directed. It is important that you put your used needles and syringes in a special sharps container. Do not put them in a trash can. If you do not have a sharps container, call your pharmacist or healthcare provider to get one. A special MedGuide will be given to you by the pharmacist with each prescription and refill. Be sure to read this information carefully each time. Talk to your pediatrician regarding the use of this medicine in children. While this drug may be prescribed for selected conditions, precautions do apply. Overdosage: If you think you have taken too much of this medicine contact a poison control center or emergency room at once. NOTE: This medicine is only for you. Do not share this medicine with others. What if I miss a dose? If you miss a dose, take it as soon as you can. If it is almost time for your next dose, take only that dose. Do not take double or extra doses. What may interact with this medicine? Interactions have not been studied. This list may not describe all possible interactions. Give your health care provider a list of all the medicines, herbs, non-prescription drugs, or dietary supplements you use. Also tell them if you smoke, drink alcohol, or use illegal drugs. Some items may interact with your medicine. What should I watch for while using this medicine? Your condition will be monitored carefully while you are receiving this medicine. You may need blood work done while you are taking this medicine. This medicine may cause a decrease in vitamin B6. You should make sure that you get enough vitamin B6 while you are taking this medicine. Discuss the foods you eat and the vitamins you take with your health care  professional. What side effects may I notice from receiving this medicine? Side effects that you should report to your doctor or health care professional as soon as possible:  allergic reactions like skin rash, itching or hives, swelling of the face, lips, or tongue  seizures  signs and symptoms of a blood clot such as breathing problems; changes in vision; chest pain; severe, sudden headache; pain, swelling, warmth in the leg; trouble speaking; sudden numbness or weakness of the face, arm or leg  signs and symptoms of a stroke like changes in vision; confusion; trouble speaking or understanding; severe headaches; sudden numbness or weakness of the face, arm or leg; trouble walking; dizziness; loss of balance or coordination Side effects that usually do not require medical attention (report to your doctor or health care professional if they continue or are bothersome):  chills  cough  dizziness  fever  headaches  joint pain  muscle cramps  muscle pain  nausea, vomiting  pain, redness, or irritation at site where injected This list may not describe all possible side effects. Call your doctor for medical advice about side effects. You may report side effects to FDA at  1-800-FDA-1088. Where should I keep my medicine? Keep out of the reach of children. Store in a refrigerator between 2 and 8 degrees C (36 and 46 degrees F). Do not freeze or shake. Throw away any unused portion if using a single-dose vial. Multi-dose vials can be kept in the refrigerator for up to 21 days after the initial dose. Throw away unused medicine. NOTE: This sheet is a summary. It may not cover all possible information. If you have questions about this medicine, talk to your doctor, pharmacist, or health care provider.  2020 Elsevier/Gold Standard (2017-03-07 08:35:19) Immune Globulin Injection What is this medicine? IMMUNE GLOBULIN (im MUNE GLOB yoo lin) helps to prevent or reduce the severity of certain  infections in patients who are at risk. This medicine is collected from the pooled blood of many donors. It is used to treat immune system problems, thrombocytopenia, and Kawasaki syndrome. This medicine may be used for other purposes; ask your health care provider or pharmacist if you have questions. COMMON BRAND NAME(S): ASCENIV, Baygam, BIVIGAM, Carimune, Carimune NF, cutaquig, Cuvitru, Flebogamma, Flebogamma DIF, GamaSTAN, GamaSTAN S/D, Gamimune N, Gammagard, Gammagard S/D, Gammaked, Gammaplex, Gammar-P IV, Gamunex, Gamunex-C, Hizentra, Iveegam, Iveegam EN, Octagam, Panglobulin, Panglobulin NF, panzyga, Polygam S/D, Privigen, Sandoglobulin, Venoglobulin-S, Vigam, Vivaglobulin, Xembify What should I tell my health care provider before I take this medicine? They need to know if you have any of these conditions:  diabetes  extremely low or no immune antibodies in the blood  heart disease  history of blood clots  hyperprolinemia  infection in the blood, sepsis  kidney disease  recently received or scheduled to receive a vaccination  an unusual or allergic reaction to human immune globulin, albumin, maltose, sucrose, other medicines, foods, dyes, or preservatives  pregnant or trying to get pregnant  breast-feeding How should I use this medicine? This medicine is for injection into a muscle or infusion into a vein or skin. It is usually given by a health care professional in a hospital or clinic setting. In rare cases, some brands of this medicine might be given at home. You will be taught how to give this medicine. Use exactly as directed. Take your medicine at regular intervals. Do not take your medicine more often than directed. Talk to your pediatrician regarding the use of this medicine in children. While this drug may be prescribed for selected conditions, precautions do apply. Overdosage: If you think you have taken too much of this medicine contact a poison control center or  emergency room at once. NOTE: This medicine is only for you. Do not share this medicine with others. What if I miss a dose? It is important not to miss your dose. Call your doctor or health care professional if you are unable to keep an appointment. If you give yourself the medicine and you miss a dose, take it as soon as you can. If it is almost time for your next dose, take only that dose. Do not take double or extra doses. What may interact with this medicine?  aspirin and aspirin-like medicines  cisplatin  cyclosporine  medicines for infection like acyclovir, adefovir, amphotericin B, bacitracin, cidofovir, foscarnet, ganciclovir, gentamicin, pentamidine, vancomycin  NSAIDS, medicines for pain and inflammation, like ibuprofen or naproxen  pamidronate  vaccines  zoledronic acid This list may not describe all possible interactions. Give your health care provider a list of all the medicines, herbs, non-prescription drugs, or dietary supplements you use. Also tell them if you smoke, drink alcohol, or  use illegal drugs. Some items may interact with your medicine. What should I watch for while using this medicine? Your condition will be monitored carefully while you are receiving this medicine. This medicine is made from pooled blood donations of many different people. It may be possible to pass an infection in this medicine. However, the donors are screened for infections and all products are tested for HIV and hepatitis. The medicine is treated to kill most or all bacteria and viruses. Talk to your doctor about the risks and benefits of this medicine. Do not have vaccinations for at least 14 days before, or until at least 3 months after receiving this medicine. What side effects may I notice from receiving this medicine? Side effects that you should report to your doctor or health care professional as soon as possible:  allergic reactions like skin rash, itching or hives, swelling of the  face, lips, or tongue  blue colored lips or skin  breathing problems  chest pain or tightness  fever  signs and symptoms of aseptic meningitis such as stiff neck; sensitivity to light; headache; drowsiness; fever; nausea; vomiting; rash  signs and symptoms of a blood clot such as chest pain; shortness of breath; pain, swelling, or warmth in the leg  signs and symptoms of hemolytic anemia such as fast heartbeat; tiredness; dark yellow or brown urine; or yellowing of the eyes or skin  signs and symptoms of kidney injury like trouble passing urine or change in the amount of urine  sudden weight gain  swelling of the ankles, feet, hands Side effects that usually do not require medical attention (report to your doctor or health care professional if they continue or are bothersome):  diarrhea  flushing  headache  increased sweating  joint pain  muscle cramps  muscle pain  nausea  pain, redness, or irritation at site where injected  tiredness This list may not describe all possible side effects. Call your doctor for medical advice about side effects. You may report side effects to FDA at 1-800-FDA-1088. Where should I keep my medicine? Keep out of the reach of children. This drug is usually given in a hospital or clinic and will not be stored at home. In rare cases, some brands of this medicine may be given at home. If you are using this medicine at home, you will be instructed on how to store this medicine. Throw away any unused medicine after the expiration date on the label. NOTE: This sheet is a summary. It may not cover all possible information. If you have questions about this medicine, talk to your doctor, pharmacist, or health care provider.  2020 Elsevier/Gold Standard (2019-03-03 12:51:14)

## 2020-07-05 NOTE — Progress Notes (Signed)
Hematology and Oncology Follow Up Visit  Christian Burns [id number] [birth year] 86 y.o. 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 Palliative radiation therapy to right ilium Kyprolis/Cytoxanq 3 week dosing- s/p cycle 24 (Cytoxan restarted on cycle 13) - DC'd due to progression Radiation therapy to right femur, 10 fraction --completed 3000 rad on 11/06/2018 Elotuzomab - started 10/29/2018 -- s/p cycle #1 -- d/c on 12/24/2018 Pomalidomide 2 mg po q day (21 on/7 off) - started 10/29/2018 -- d/c 12/31/2018 Daratumumab - start cycle # 1 on 12/31/2018 -- d/c on -02/04/2019  Current Therapy: Bendamustine/Decadron -- started on 02/10/2019, s/p cycle15 -- Bendamustine given just 1 day Zometa IV q 4 weeks - on hold Retacritneeded for hemoglobin less than 10 IVIG 40g IV q month -- started on 07/2019   Interim History:  Christian Burns is here today for follow-up and treatment. He is feeling fatigued and has no appetite. His weight is down another 2 lbs.  He is drinking milk with every meal as well as a boost on ensure daily.  He has to eat slowly due to issues with swallowing and so eating has become a daunting task for him. He is taking his Marinol prior to his 2 largest meals as prescribed. He is hydrating properly.  M-spike last month was 0.3 g/dL, IgG level 457 mg/dL and lambda light chains 16.1 mg/L.  No fever, chills, cough, rash, dizziness, SOB, chest pain, palpitations, abdominal pain or changes in bowel or bladder habits.  He is still incontinent of stool at times. He has only been taking imodium at bedtime each day and will try taking 2-3 times daily as needed.  No swelling or tenderness in his  extremities. No falls or syncope.  Neuropathy in his hands and feet is stable.  No blood loss noted. No abnormal bruising or petechiae.   ECOG Performance Status: 1 - Symptomatic but completely ambulatory  Medications:  Allergies as of 07/05/2020      Reactions   Sulfa Antibiotics Itching   Sulfasalazine Itching      Medication List       Accurate as of July 05, 2020  9:16 AM. If you have any questions, ask your nurse or doctor.        amLODipine-benazepril 5-10 MG capsule Commonly known as: LOTREL TAKE ONE CAPSULE EACH DAY   aspirin EC 81 MG tablet Take 1 tablet (81 mg total) by mouth daily. Swallow whole.   dronabinol 5 MG capsule Commonly known as: MARINOL TAKE ONE CAPSULE TWICE A DAY BEFORE A MEAL   DULoxetine 60 MG capsule Commonly known as: CYMBALTA Take 1 capsule (60 mg total) by mouth daily.   Eye Lubricant Oint Apply 1 application to eye in the morning and at bedtime. If left eye lower eye lid is irritated   finasteride 5 MG tablet Commonly known as: PROSCAR TAKE ONE TABLET EACH DAY What changed: See the new instructions.   lidocaine-prilocaine cream Commonly known as: EMLA Apply 1 application topically as needed.   metoprolol succinate 25 MG 24 hr tablet Commonly known as: TOPROL-XL Take 25 mg by mouth daily.   ondansetron 4 MG tablet Commonly known as: ZOFRAN Take 1 tablet (4 mg total) by mouth every 6 (six) hours as needed  for nausea.   pantoprazole 40 MG tablet Commonly known as: Protonix Take 1 tablet (40 mg total) by mouth 2 (two) times daily.   penicillin v potassium 500 MG tablet Commonly known as: VEETID TAKE ONE TABLET FOUR TIMES DAILY   polyvinyl alcohol 1.4 % ophthalmic solution Commonly known as: LIQUIFILM TEARS Place 1 drop into both eyes as needed for dry eyes.   predniSONE 20 MG tablet Commonly known as: DELTASONE TAKE ONE TABLET EACH DAY WITH BREAKFAST   PROBIOTIC PO Take 1 capsule by mouth daily.   pyridOXINE 100  MG tablet Commonly known as: VITAMIN B-6 Take 100 mg by mouth daily.   QC MULTI-VITE 50 & OVER PO TAKE ONE TABLET DAILY   traMADol 50 MG tablet Commonly known as: ULTRAM Take 1 tablet (50 mg total) by mouth every 8 (eight) hours as needed for moderate pain.   vitamin B-12 500 MCG tablet Commonly known as: CYANOCOBALAMIN Take 500 mcg by mouth daily.   Vitamin D (Ergocalciferol) 1.25 MG (50000 UNIT) Caps capsule Commonly known as: DRISDOL Take 50,000 Units by mouth every Sunday.       Allergies:  Allergies  Allergen Reactions  . Sulfa Antibiotics Itching  . Sulfasalazine Itching    Past Medical History, Surgical history, Social history, and Family History were reviewed and updated.  Review of Systems: All other 10 point review of systems is negative.   Physical Exam:  vitals were not taken for this visit.   Wt Readings from Last 3 Encounters:  06/08/20 129 lb (58.5 kg)  06/07/20 129 lb (58.5 kg)  06/05/20 129 lb 6.4 oz (58.7 kg)    Ocular: Sclerae unicteric, pupils equal, round and reactive to light Ear-nose-throat: Oropharynx clear, dentition fair Lymphatic: No cervical or supraclavicular adenopathy Lungs no rales or rhonchi, good excursion bilaterally Heart regular rate and rhythm, no murmur appreciated Abd soft, nontender, positive bowel sounds MSK no focal spinal tenderness, no joint edema Neuro: non-focal, well-oriented, appropriate affect Breasts: Deferred   Lab Results  Component Value Date   WBC 5.3 06/08/2020   HGB 9.6 (L) 06/08/2020   HCT 29.3 (L) 06/08/2020   MCV 92.7 06/08/2020   PLT 151 06/08/2020   Lab Results  Component Value Date   FERRITIN 741 (H) 05/03/2020   IRON 58 04/06/2020   TIBC 225 04/06/2020   UIBC 167 04/06/2020   IRONPCTSAT 26 04/06/2020   Lab Results  Component Value Date   RETICCTPCT 1.1 04/06/2020   RBC 3.16 (L) 06/08/2020   Lab Results  Component Value Date   KPAFRELGTCHN 5.7 06/08/2020   LAMBDASER 16.1  06/08/2020   KAPLAMBRATIO 0.35 06/08/2020   Lab Results  Component Value Date   IGGSERUM 457 (L) 06/08/2020   IGGSERUM 428 (L) 06/08/2020   IGA 7 (L) 06/08/2020   IGA 7 (L) 06/08/2020   IGMSERUM 182 (H) 06/08/2020   IGMSERUM 185 (H) 06/08/2020   Lab Results  Component Value Date   TOTALPROTELP 5.4 (L) 06/08/2020   ALBUMINELP 3.1 06/08/2020   A1GS 0.3 06/08/2020   A2GS 0.8 06/08/2020   BETS 0.8 06/08/2020   BETA2SER 0.3 07/28/2015   GAMS 0.5 06/08/2020   MSPIKE 0.3 (H) 06/08/2020   SPEI Comment 04/06/2020     Chemistry      Component Value Date/Time   NA 137 06/08/2020 0935   NA 146 (H) 08/07/2017 1153   NA 141 01/09/2017 1004   K 4.8 06/08/2020 0935   K 4.6 08/07/2017 1153   K 4.4  01/09/2017 1004   CL 105 06/08/2020 0935   CL 108 08/07/2017 1153   CO2 24 06/08/2020 0935   CO2 26 08/07/2017 1153   CO2 22 01/09/2017 1004   BUN 61 (H) 06/08/2020 0935   BUN 24 (H) 08/07/2017 1153   BUN 23.9 01/09/2017 1004   CREATININE 1.85 (H) 06/08/2020 0935   CREATININE 1.56 (H) 05/26/2018 1508   CREATININE 1.5 (H) 01/09/2017 1004      Component Value Date/Time   CALCIUM 9.4 06/08/2020 0935   CALCIUM 8.8 08/07/2017 1153   CALCIUM 8.8 01/09/2017 1004   ALKPHOS 49 06/08/2020 0935   ALKPHOS 97 (H) 08/07/2017 1153   ALKPHOS 80 01/09/2017 1004   AST 23 06/08/2020 0935   AST 18 01/09/2017 1004   ALT 13 06/08/2020 0935   ALT 20 08/07/2017 1153   ALT 12 01/09/2017 1004   BILITOT 0.5 06/08/2020 0935   BILITOT 0.92 01/09/2017 1004       Impression and Plan: Mr. Upchurch is a very pleasant 86 yo caucasian gentleman with relapsed IgG lambda myeloma. We will proceed with IVIG and Retacrit today.  He will also get I unit of blood as well.  Iron studies are pending. We can replace starting next week if needed.  Follow-up in 3 weeks so we can make sure he is ready for Christmas.  He was encouraged to contact our office with any questions or concerns. We can certainly see him sooner if  needed.   Laverna Peace, NP 11/24/20219:16 AM

## 2020-07-06 LAB — IGG, IGA, IGM
IgA: 7 mg/dL — ABNORMAL LOW (ref 61–437)
IgG (Immunoglobin G), Serum: 666 mg/dL (ref 603–1613)
IgM (Immunoglobulin M), Srm: 81 mg/dL (ref 15–143)

## 2020-07-06 LAB — BPAM RBC
Blood Product Expiration Date: 202112252359
ISSUE DATE / TIME: 202111241400
Unit Type and Rh: 5100

## 2020-07-06 LAB — TYPE AND SCREEN
ABO/RH(D): O POS
Antibody Screen: NEGATIVE
Unit division: 0

## 2020-07-07 LAB — KAPPA/LAMBDA LIGHT CHAINS
Kappa free light chain: 8.1 mg/L (ref 3.3–19.4)
Kappa, lambda light chain ratio: 0.55 (ref 0.26–1.65)
Lambda free light chains: 14.7 mg/L (ref 5.7–26.3)

## 2020-07-09 LAB — PROTEIN ELECTROPHORESIS, SERUM
A/G Ratio: 1.2 (ref 0.7–1.7)
Albumin ELP: 3 g/dL (ref 2.9–4.4)
Alpha-1-Globulin: 0.3 g/dL (ref 0.0–0.4)
Alpha-2-Globulin: 0.8 g/dL (ref 0.4–1.0)
Beta Globulin: 0.8 g/dL (ref 0.7–1.3)
Gamma Globulin: 0.6 g/dL (ref 0.4–1.8)
Globulin, Total: 2.5 g/dL (ref 2.2–3.9)
M-Spike, %: 0.3 g/dL — ABNORMAL HIGH
Total Protein ELP: 5.5 g/dL — ABNORMAL LOW (ref 6.0–8.5)

## 2020-07-11 ENCOUNTER — Other Ambulatory Visit: Payer: Self-pay | Admitting: Internal Medicine

## 2020-07-11 ENCOUNTER — Other Ambulatory Visit: Payer: Self-pay | Admitting: Hematology & Oncology

## 2020-07-17 ENCOUNTER — Other Ambulatory Visit: Payer: Self-pay | Admitting: Hematology & Oncology

## 2020-07-17 DIAGNOSIS — I10 Essential (primary) hypertension: Secondary | ICD-10-CM

## 2020-07-24 ENCOUNTER — Telehealth: Payer: Self-pay | Admitting: Hematology & Oncology

## 2020-07-24 NOTE — Telephone Encounter (Signed)
Returned call to patient. His appointments for 12/15 were moved out by one week per verbal OK from Dr Marin Olp per 12/13 staff message

## 2020-07-26 ENCOUNTER — Other Ambulatory Visit: Payer: Medicare HMO

## 2020-07-26 ENCOUNTER — Ambulatory Visit: Payer: Medicare HMO | Admitting: Hematology & Oncology

## 2020-07-26 ENCOUNTER — Ambulatory Visit: Payer: Medicare HMO

## 2020-07-26 ENCOUNTER — Ambulatory Visit: Payer: Medicare HMO | Admitting: Family

## 2020-08-01 ENCOUNTER — Ambulatory Visit: Payer: Medicare HMO | Admitting: Nurse Practitioner

## 2020-08-02 ENCOUNTER — Inpatient Hospital Stay (HOSPITAL_BASED_OUTPATIENT_CLINIC_OR_DEPARTMENT_OTHER): Payer: Medicare HMO | Admitting: Family

## 2020-08-02 ENCOUNTER — Inpatient Hospital Stay: Payer: Medicare HMO

## 2020-08-02 ENCOUNTER — Telehealth: Payer: Self-pay | Admitting: Family

## 2020-08-02 ENCOUNTER — Other Ambulatory Visit: Payer: Self-pay

## 2020-08-02 ENCOUNTER — Inpatient Hospital Stay: Payer: Medicare HMO | Attending: Hematology & Oncology

## 2020-08-02 ENCOUNTER — Encounter: Payer: Self-pay | Admitting: Family

## 2020-08-02 VITALS — BP 160/84 | HR 80 | Temp 97.7°F | Resp 18 | Ht 70.0 in | Wt 126.0 lb

## 2020-08-02 VITALS — BP 179/82 | Temp 97.5°F | Resp 18

## 2020-08-02 DIAGNOSIS — Z923 Personal history of irradiation: Secondary | ICD-10-CM | POA: Insufficient documentation

## 2020-08-02 DIAGNOSIS — T451X5A Adverse effect of antineoplastic and immunosuppressive drugs, initial encounter: Secondary | ICD-10-CM | POA: Diagnosis not present

## 2020-08-02 DIAGNOSIS — R159 Full incontinence of feces: Secondary | ICD-10-CM | POA: Insufficient documentation

## 2020-08-02 DIAGNOSIS — C9002 Multiple myeloma in relapse: Secondary | ICD-10-CM | POA: Diagnosis present

## 2020-08-02 DIAGNOSIS — Z79899 Other long term (current) drug therapy: Secondary | ICD-10-CM | POA: Insufficient documentation

## 2020-08-02 DIAGNOSIS — C9000 Multiple myeloma not having achieved remission: Secondary | ICD-10-CM

## 2020-08-02 DIAGNOSIS — Z882 Allergy status to sulfonamides status: Secondary | ICD-10-CM | POA: Diagnosis not present

## 2020-08-02 DIAGNOSIS — D649 Anemia, unspecified: Secondary | ICD-10-CM

## 2020-08-02 DIAGNOSIS — N289 Disorder of kidney and ureter, unspecified: Secondary | ICD-10-CM | POA: Insufficient documentation

## 2020-08-02 DIAGNOSIS — D508 Other iron deficiency anemias: Secondary | ICD-10-CM

## 2020-08-02 DIAGNOSIS — G629 Polyneuropathy, unspecified: Secondary | ICD-10-CM | POA: Diagnosis not present

## 2020-08-02 DIAGNOSIS — M272 Inflammatory conditions of jaws: Secondary | ICD-10-CM | POA: Insufficient documentation

## 2020-08-02 DIAGNOSIS — D6481 Anemia due to antineoplastic chemotherapy: Secondary | ICD-10-CM | POA: Diagnosis not present

## 2020-08-02 DIAGNOSIS — R682 Dry mouth, unspecified: Secondary | ICD-10-CM | POA: Diagnosis not present

## 2020-08-02 DIAGNOSIS — Z5111 Encounter for antineoplastic chemotherapy: Secondary | ICD-10-CM | POA: Insufficient documentation

## 2020-08-02 LAB — CMP (CANCER CENTER ONLY)
ALT: 14 U/L (ref 0–44)
AST: 30 U/L (ref 15–41)
Albumin: 3.3 g/dL — ABNORMAL LOW (ref 3.5–5.0)
Alkaline Phosphatase: 42 U/L (ref 38–126)
Anion gap: 8 (ref 5–15)
BUN: 57 mg/dL — ABNORMAL HIGH (ref 8–23)
CO2: 26 mmol/L (ref 22–32)
Calcium: 9.4 mg/dL (ref 8.9–10.3)
Chloride: 105 mmol/L (ref 98–111)
Creatinine: 1.64 mg/dL — ABNORMAL HIGH (ref 0.61–1.24)
GFR, Estimated: 40 mL/min — ABNORMAL LOW (ref >60)
Glucose, Bld: 122 mg/dL — ABNORMAL HIGH (ref 70–99)
Potassium: 4.1 mmol/L (ref 3.5–5.1)
Sodium: 139 mmol/L (ref 135–145)
Total Bilirubin: 0.4 mg/dL (ref 0.3–1.2)
Total Protein: 5.8 g/dL — ABNORMAL LOW (ref 6.5–8.1)

## 2020-08-02 LAB — LACTATE DEHYDROGENASE: LDH: 380 U/L — ABNORMAL HIGH (ref 98–192)

## 2020-08-02 LAB — CBC WITH DIFFERENTIAL (CANCER CENTER ONLY)
Abs Immature Granulocytes: 0 10*3/uL (ref 0.00–0.07)
Band Neutrophils: 2 %
Basophils Absolute: 0.1 10*3/uL (ref 0.0–0.1)
Basophils Relative: 2 %
Eosinophils Absolute: 0.1 10*3/uL (ref 0.0–0.5)
Eosinophils Relative: 2 %
HCT: 28.8 % — ABNORMAL LOW (ref 39.0–52.0)
Hemoglobin: 9.5 g/dL — ABNORMAL LOW (ref 13.0–17.0)
Lymphocytes Relative: 4 %
Lymphs Abs: 0.1 10*3/uL — ABNORMAL LOW (ref 0.7–4.0)
MCH: 30.9 pg (ref 26.0–34.0)
MCHC: 33 g/dL (ref 30.0–36.0)
MCV: 93.8 fL (ref 80.0–100.0)
Monocytes Absolute: 0.7 10*3/uL (ref 0.1–1.0)
Monocytes Relative: 22 %
Neutro Abs: 2.1 10*3/uL (ref 1.7–7.7)
Neutrophils Relative %: 68 %
Platelet Count: 124 10*3/uL — ABNORMAL LOW (ref 150–400)
RBC: 3.07 MIL/uL — ABNORMAL LOW (ref 4.22–5.81)
RDW: 17.2 % — ABNORMAL HIGH (ref 11.5–15.5)
WBC Count: 3 10*3/uL — ABNORMAL LOW (ref 4.0–10.5)
nRBC: 0 % (ref 0.0–0.2)

## 2020-08-02 LAB — RETICULOCYTES
Immature Retic Fract: 5.7 % (ref 2.3–15.9)
RBC.: 3.04 MIL/uL — ABNORMAL LOW (ref 4.22–5.81)
Retic Count, Absolute: 32.2 10*3/uL (ref 19.0–186.0)
Retic Ct Pct: 1.1 % (ref 0.4–3.1)

## 2020-08-02 LAB — SAMPLE TO BLOOD BANK

## 2020-08-02 MED ORDER — SODIUM CHLORIDE 0.9% FLUSH
10.0000 mL | INTRAVENOUS | Status: DC | PRN
  Administered 2020-08-02: 10 mL
  Filled 2020-08-02: qty 10

## 2020-08-02 MED ORDER — EPOETIN ALFA-EPBX 40000 UNIT/ML IJ SOLN
40000.0000 [IU] | Freq: Once | INTRAMUSCULAR | Status: AC
  Administered 2020-08-02: 40000 [IU] via SUBCUTANEOUS

## 2020-08-02 MED ORDER — SODIUM CHLORIDE 0.9 % IV SOLN
Freq: Once | INTRAVENOUS | Status: AC
  Filled 2020-08-02: qty 250

## 2020-08-02 MED ORDER — EPOETIN ALFA-EPBX 40000 UNIT/ML IJ SOLN
INTRAMUSCULAR | Status: AC
  Filled 2020-08-02: qty 1

## 2020-08-02 MED ORDER — IMMUNE GLOBULIN (HUMAN) 20 GM/200ML IV SOLN
40.0000 g | Freq: Once | INTRAVENOUS | Status: AC
  Administered 2020-08-02: 40 g via INTRAVENOUS
  Filled 2020-08-02: qty 400

## 2020-08-02 MED ORDER — SODIUM CHLORIDE 0.9 % IV SOLN
10.0000 mg | Freq: Once | INTRAVENOUS | Status: AC
  Administered 2020-08-02: 10 mg via INTRAVENOUS
  Filled 2020-08-02: qty 10

## 2020-08-02 MED ORDER — PALONOSETRON HCL INJECTION 0.25 MG/5ML
0.2500 mg | Freq: Once | INTRAVENOUS | Status: AC
  Administered 2020-08-02: 0.25 mg via INTRAVENOUS

## 2020-08-02 MED ORDER — HEPARIN SOD (PORK) LOCK FLUSH 100 UNIT/ML IV SOLN
500.0000 [IU] | Freq: Once | INTRAVENOUS | Status: AC | PRN
  Administered 2020-08-02: 500 [IU]
  Filled 2020-08-02: qty 5

## 2020-08-02 MED ORDER — SODIUM CHLORIDE 0.9 % IV SOLN
75.0000 mg/m2 | Freq: Once | INTRAVENOUS | Status: AC
  Administered 2020-08-02: 125 mg via INTRAVENOUS
  Filled 2020-08-02: qty 5

## 2020-08-02 MED ORDER — DIPHENHYDRAMINE HCL 25 MG PO CAPS
ORAL_CAPSULE | ORAL | Status: AC
  Filled 2020-08-02: qty 1

## 2020-08-02 MED ORDER — ACETAMINOPHEN 325 MG PO TABS
ORAL_TABLET | ORAL | Status: AC
  Filled 2020-08-02: qty 2

## 2020-08-02 MED ORDER — DIPHENHYDRAMINE HCL 25 MG PO CAPS
25.0000 mg | ORAL_CAPSULE | Freq: Once | ORAL | Status: AC
  Administered 2020-08-02: 25 mg via ORAL

## 2020-08-02 MED ORDER — PALONOSETRON HCL INJECTION 0.25 MG/5ML
INTRAVENOUS | Status: AC
  Filled 2020-08-02: qty 5

## 2020-08-02 MED ORDER — ACETAMINOPHEN 325 MG PO TABS
650.0000 mg | ORAL_TABLET | Freq: Once | ORAL | Status: AC
  Administered 2020-08-02: 650 mg via ORAL

## 2020-08-02 NOTE — Patient Instructions (Signed)
Bendamustine Injection What is this medicine? BENDAMUSTINE (BEN da MUS teen) is a chemotherapy drug. It is used to treat chronic lymphocytic leukemia and non-Hodgkin lymphoma. This medicine may be used for other purposes; ask your health care provider or pharmacist if you have questions. COMMON BRAND NAME(S): Kristine Royal, Treanda What should I tell my health care provider before I take this medicine? They need to know if you have any of these conditions:  infection (especially a virus infection such as chickenpox, cold sores, or herpes)  kidney disease  liver disease  an unusual or allergic reaction to bendamustine, mannitol, other medicines, foods, dyes, or preservatives  pregnant or trying to get pregnant  breast-feeding How should I use this medicine? This medicine is for infusion into a vein. It is given by a health care professional in a hospital or clinic setting. Talk to your pediatrician regarding the use of this medicine in children. Special care may be needed. Overdosage: If you think you have taken too much of this medicine contact a poison control center or emergency room at once. NOTE: This medicine is only for you. Do not share this medicine with others. What if I miss a dose? It is important not to miss your dose. Call your doctor or health care professional if you are unable to keep an appointment. What may interact with this medicine? Do not take this medicine with any of the following medications:  clozapine This medicine may also interact with the following medications:  atazanavir  cimetidine  ciprofloxacin  enoxacin  fluvoxamine  medicines for seizures like carbamazepine and phenobarbital  mexiletine  rifampin  tacrine  thiabendazole  zileuton This list may not describe all possible interactions. Give your health care provider a list of all the medicines, herbs, non-prescription drugs, or dietary supplements you use. Also tell them if  you smoke, drink alcohol, or use illegal drugs. Some items may interact with your medicine. What should I watch for while using this medicine? This drug may make you feel generally unwell. This is not uncommon, as chemotherapy can affect healthy cells as well as cancer cells. Report any side effects. Continue your course of treatment even though you feel ill unless your doctor tells you to stop. You may need blood work done while you are taking this medicine. Call your doctor or healthcare provider for advice if you get a fever, chills or sore throat, or other symptoms of a cold or flu. Do not treat yourself. This drug decreases your body's ability to fight infections. Try to avoid being around people who are sick. This medicine may cause serious skin reactions. They can happen weeks to months after starting the medicine. Contact your healthcare provider right away if you notice fevers or flu-like symptoms with a rash. The rash may be red or purple and then turn into blisters or peeling of the skin. Or, you might notice a red rash with swelling of the face, lips or lymph nodes in your neck or under your arms. This medicine may increase your risk to bruise or bleed. Call your doctor or healthcare provider if you notice any unusual bleeding. Talk to your doctor about your risk of cancer. You may be more at risk for certain types of cancers if you take this medicine. Do not become pregnant while taking this medicine or for at least 6 months after stopping it. Women should inform their doctor if they wish to become pregnant or think they might be pregnant. Men should not  father a child while taking this medicine and for at least 3 months after stopping it. There is a potential for serious side effects to an unborn child. Talk to your healthcare provider or pharmacist for more information. Do not breast-feed an infant while taking this medicine or for at least 1 week after stopping it. This medicine may make it  more difficult to father a child. You should talk with your doctor or healthcare provider if you are concerned about your fertility. What side effects may I notice from receiving this medicine? Side effects that you should report to your doctor or health care professional as soon as possible:  allergic reactions like skin rash, itching or hives, swelling of the face, lips, or tongue  low blood counts - this medicine may decrease the number of white blood cells, red blood cells and platelets. You may be at increased risk for infections and bleeding.  rash, fever, and swollen lymph nodes  redness, blistering, peeling, or loosening of the skin, including inside the mouth  signs of infection like fever or chills, cough, sore throat, pain or difficulty passing urine  signs of decreased platelets or bleeding like bruising, pinpoint red spots on the skin, black, tarry stools, blood in the urine  signs of decreased red blood cells like being unusually weak or tired, fainting spells, lightheadedness  signs and symptoms of kidney injury like trouble passing urine or change in the amount of urine  signs and symptoms of liver injury like dark yellow or brown urine; general ill feeling or flu-like symptoms; light-colored stools; loss of appetite; nausea; right upper belly pain; unusually weak or tired; yellowing of the eyes or skin Side effects that usually do not require medical attention (report to your doctor or health care professional if they continue or are bothersome):  constipation  decreased appetite  diarrhea  headache  mouth sores  nausea, vomiting  tiredness This list may not describe all possible side effects. Call your doctor for medical advice about side effects. You may report side effects to FDA at 1-800-FDA-1088. Where should I keep my medicine? This drug is given in a hospital or clinic and will not be stored at home. NOTE: This sheet is a summary. It may not cover all  possible information. If you have questions about this medicine, talk to your doctor, pharmacist, or health care provider.  2020 Elsevier/Gold Standard (2018-10-20 10:26:46)  Immune Globulin Injection What is this medicine? IMMUNE GLOBULIN (im MUNE GLOB yoo lin) helps to prevent or reduce the severity of certain infections in patients who are at risk. This medicine is collected from the pooled blood of many donors. It is used to treat immune system problems, thrombocytopenia, and Kawasaki syndrome. This medicine may be used for other purposes; ask your health care provider or pharmacist if you have questions. COMMON BRAND NAME(S): ASCENIV, Baygam, BIVIGAM, Carimune, Carimune NF, cutaquig, Cuvitru, Flebogamma, Flebogamma DIF, GamaSTAN, GamaSTAN S/D, Gamimune N, Gammagard, Gammagard S/D, Gammaked, Gammaplex, Gammar-P IV, Gamunex, Gamunex-C, Hizentra, Iveegam, Iveegam EN, Octagam, Panglobulin, Panglobulin NF, panzyga, Polygam S/D, Privigen, Sandoglobulin, Venoglobulin-S, Vigam, Vivaglobulin, Xembify What should I tell my health care provider before I take this medicine? They need to know if you have any of these conditions:  diabetes  extremely low or no immune antibodies in the blood  heart disease  history of blood clots  hyperprolinemia  infection in the blood, sepsis  kidney disease  recently received or scheduled to receive a vaccination  an unusual or allergic  reaction to human immune globulin, albumin, maltose, sucrose, other medicines, foods, dyes, or preservatives  pregnant or trying to get pregnant  breast-feeding How should I use this medicine? This medicine is for injection into a muscle or infusion into a vein or skin. It is usually given by a health care professional in a hospital or clinic setting. In rare cases, some brands of this medicine might be given at home. You will be taught how to give this medicine. Use exactly as directed. Take your medicine at regular  intervals. Do not take your medicine more often than directed. Talk to your pediatrician regarding the use of this medicine in children. While this drug may be prescribed for selected conditions, precautions do apply. Overdosage: If you think you have taken too much of this medicine contact a poison control center or emergency room at once. NOTE: This medicine is only for you. Do not share this medicine with others. What if I miss a dose? It is important not to miss your dose. Call your doctor or health care professional if you are unable to keep an appointment. If you give yourself the medicine and you miss a dose, take it as soon as you can. If it is almost time for your next dose, take only that dose. Do not take double or extra doses. What may interact with this medicine?  aspirin and aspirin-like medicines  cisplatin  cyclosporine  medicines for infection like acyclovir, adefovir, amphotericin B, bacitracin, cidofovir, foscarnet, ganciclovir, gentamicin, pentamidine, vancomycin  NSAIDS, medicines for pain and inflammation, like ibuprofen or naproxen  pamidronate  vaccines  zoledronic acid This list may not describe all possible interactions. Give your health care provider a list of all the medicines, herbs, non-prescription drugs, or dietary supplements you use. Also tell them if you smoke, drink alcohol, or use illegal drugs. Some items may interact with your medicine. What should I watch for while using this medicine? Your condition will be monitored carefully while you are receiving this medicine. This medicine is made from pooled blood donations of many different people. It may be possible to pass an infection in this medicine. However, the donors are screened for infections and all products are tested for HIV and hepatitis. The medicine is treated to kill most or all bacteria and viruses. Talk to your doctor about the risks and benefits of this medicine. Do not have vaccinations  for at least 14 days before, or until at least 3 months after receiving this medicine. What side effects may I notice from receiving this medicine? Side effects that you should report to your doctor or health care professional as soon as possible:  allergic reactions like skin rash, itching or hives, swelling of the face, lips, or tongue  blue colored lips or skin  breathing problems  chest pain or tightness  fever  signs and symptoms of aseptic meningitis such as stiff neck; sensitivity to light; headache; drowsiness; fever; nausea; vomiting; rash  signs and symptoms of a blood clot such as chest pain; shortness of breath; pain, swelling, or warmth in the leg  signs and symptoms of hemolytic anemia such as fast heartbeat; tiredness; dark yellow or brown urine; or yellowing of the eyes or skin  signs and symptoms of kidney injury like trouble passing urine or change in the amount of urine  sudden weight gain  swelling of the ankles, feet, hands Side effects that usually do not require medical attention (report to your doctor or health care professional if  they continue or are bothersome):  diarrhea  flushing  headache  increased sweating  joint pain  muscle cramps  muscle pain  nausea  pain, redness, or irritation at site where injected  tiredness This list may not describe all possible side effects. Call your doctor for medical advice about side effects. You may report side effects to FDA at 1-800-FDA-1088. Where should I keep my medicine? Keep out of the reach of children. This drug is usually given in a hospital or clinic and will not be stored at home. In rare cases, some brands of this medicine may be given at home. If you are using this medicine at home, you will be instructed on how to store this medicine. Throw away any unused medicine after the expiration date on the label. NOTE: This sheet is a summary. It may not cover all possible information. If you  have questions about this medicine, talk to your doctor, pharmacist, or health care provider.  2020 Elsevier/Gold Standard (2019-03-03 12:51:14)

## 2020-08-02 NOTE — Progress Notes (Signed)
Pt discharged in no apparent distress. Pt left ambulatory with walker. Pt aware of discharge instructions and verbalized understanding and had no further questions. Stable and asymptomatic

## 2020-08-02 NOTE — Progress Notes (Signed)
Hematology and Oncology Follow Up Visit  Christian Burns [id number] 1930-05-05 86 y.o. 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 Palliative radiation therapy to right ilium Kyprolis/Cytoxanq 3 week dosing- s/p cycle 24 (Cytoxan restarted on cycle 13) - DC'd due to progression Radiation therapy to right femur, 10 fraction --completed 3000 rad on 11/06/2018 Elotuzomab - started 10/29/2018 -- s/p cycle #1 -- d/c on 12/24/2018 Pomalidomide 2 mg po q day (21 on/7 off) - started 10/29/2018 -- d/c 12/31/2018 Daratumumab - start cycle # 1 on 12/31/2018 -- d/c on -02/04/2019  Current Therapy: Bendamustine/Decadron -- started on 02/10/2019, s/p cycle16-- Bendamustine given just 1 day Zometa IV q 4 weeks - permanently discontinued due to chronic osteomyelitis of the left jaw Retacritneeded for hemoglobin less than 10 IVIG 40g IV q month -- started on 07/2019   Interim History:  Christian Burns is here today for follow-up and treatment. He is doing fairly well. He has increased his protein intake to help with his weight. He is drinking 2 high protein Boost each day.  He still has the same issue with eating and states that he does not have an appetite but is still making himself eat. He is taking the Marinol 30-45 minutes prior to his 2 biggest meals each day.  His weight is stable at 126. He states that his weight goal for his next visit is 130 lbs.  He has dry mouth after taking his PCP each day for the chronic osteomyelitis of the left jaw and hydrates often throughout the day. He has been seeing Dr. Tarri Glenn and really likes him.  November M-spike was stable at 0.3 g/dL, IgG level 667 mg/dL and lambda light  chains 1.47 mg/dL.  He has what appears to be a fungus under his left thumb nail. He will try Tinactin cream.  No fever, chills, n/v, cough, rash, dizziness, SOB, chest pain, palpitations, abdominal pain or changes in bladder habits.  He is still having stool incontinence despite taking Imodium as directed. He states that it is not watery just soft stool. He has no control and can not tell when he needs to go. He wears an adult brief. He has a follow-up with [facility] on 08/16/2020.  No swelling or tenderness noted on exam.  The neuropathy in his hands and feet is stable.  No falls or syncope to report. He is ambulating with his walker for added support.  He bruises easily on aspirin. He has some bruising on his hands which his says are the areas he bumps the most. No petechiae noted. No abnormal blood loss noted by the patient.   ECOG Performance Status: 1 - Symptomatic but completely ambulatory  Medications:  Allergies as of 08/02/2020      Reactions   Sulfa Antibiotics Itching   Sulfasalazine Itching      Medication List       Accurate as of August 02, 2020 11:59 AM. If you have any questions, ask your nurse or doctor.        amLODipine-benazepril 5-10 MG capsule Commonly known as: LOTREL TAKE ONE CAPSULE EACH DAY   aspirin EC 81 MG tablet Take 1 tablet (81 mg total) by mouth daily. Swallow whole.   dronabinol 5 MG capsule Commonly  known as: MARINOL TAKE ONE CAPSULE TWICE A DAY BEFORE A MEAL   DULoxetine 60 MG capsule Commonly known as: CYMBALTA Take 1 capsule (60 mg total) by mouth daily.   Eye Lubricant Oint Apply 1 application to eye in the morning and at bedtime. If left eye lower eye lid is irritated   finasteride 5 MG tablet Commonly known as: PROSCAR TAKE ONE TABLET EACH DAY What changed: See the new instructions.   lidocaine-prilocaine cream Commonly known as: EMLA Apply 1 application topically as needed.   metoprolol succinate 25 MG 24 hr  tablet Commonly known as: TOPROL-XL Take 25 mg by mouth daily.   ondansetron 4 MG tablet Commonly known as: ZOFRAN Take 1 tablet (4 mg total) by mouth every 6 (six) hours as needed for nausea.   pantoprazole 40 MG tablet Commonly known as: Protonix Take 1 tablet (40 mg total) by mouth 2 (two) times daily.   penicillin v potassium 500 MG tablet Commonly known as: VEETID TAKE ONE TABLET FOUR TIMES DAILY   polyvinyl alcohol 1.4 % ophthalmic solution Commonly known as: LIQUIFILM TEARS Place 1 drop into both eyes as needed for dry eyes.   predniSONE 20 MG tablet Commonly known as: DELTASONE TAKE ONE TABLET EACH DAY WITH BREAKFAST   PROBIOTIC PO Take 1 capsule by mouth daily.   pyridOXINE 100 MG tablet Commonly known as: VITAMIN B-6 Take 100 mg by mouth daily.   QC MULTI-VITE 50 & OVER PO TAKE ONE TABLET DAILY   traMADol 50 MG tablet Commonly known as: ULTRAM Take 1 tablet (50 mg total) by mouth every 8 (eight) hours as needed for moderate pain.   vitamin B-12 500 MCG tablet Commonly known as: CYANOCOBALAMIN Take 500 mcg by mouth daily.   Vitamin D (Ergocalciferol) 1.25 MG (50000 UNIT) Caps capsule Commonly known as: DRISDOL Take 50,000 Units by mouth every Sunday.       Allergies:  Allergies  Allergen Reactions  . Sulfa Antibiotics Itching  . Sulfasalazine Itching    Past Medical History, Surgical history, Social history, and Family History were reviewed and updated.  Review of Systems: All other 10 point review of systems is negative.   Physical Exam:  height is 5\' 10"  (1.778 m) and weight is 126 lb (57.2 kg). His oral temperature is 97.7 F (36.5 C). His blood pressure is 160/84 (abnormal) and his pulse is 80. His respiration is 18 and oxygen saturation is 98%.   Wt Readings from Last 3 Encounters:  08/02/20 126 lb (57.2 kg)  07/05/20 127 lb (57.6 kg)  06/08/20 129 lb (58.5 kg)    Ocular: Sclerae unicteric, pupils equal, round and reactive to  light Ear-nose-throat: Oropharynx clear, dentition fair Lymphatic: No cervical or supraclavicular adenopathy Lungs no rales or rhonchi, good excursion bilaterally Heart regular rate and rhythm, no murmur appreciated Abd soft, nontender, positive bowel sounds MSK no focal spinal tenderness, no joint edema Neuro: non-focal, well-oriented, appropriate affect Breasts: Deferred   Lab Results  Component Value Date   WBC 3.0 (L) 08/02/2020   HGB 9.5 (L) 08/02/2020   HCT 28.8 (L) 08/02/2020   MCV 93.8 08/02/2020   PLT 124 (L) 08/02/2020   Lab Results  Component Value Date   FERRITIN 807 (H) 07/05/2020   IRON 90 07/05/2020   TIBC 223 07/05/2020   UIBC 133 07/05/2020   IRONPCTSAT 40 07/05/2020   Lab Results  Component Value Date   RETICCTPCT 1.1 08/02/2020   RBC 3.07 (L) 08/02/2020   RBC  3.04 (L) 08/02/2020   Lab Results  Component Value Date   KPAFRELGTCHN 8.1 07/05/2020   LAMBDASER 14.7 07/05/2020   KAPLAMBRATIO 0.55 07/05/2020   Lab Results  Component Value Date   IGGSERUM 666 07/05/2020   IGA 7 (L) 07/05/2020   IGMSERUM 81 07/05/2020   Lab Results  Component Value Date   TOTALPROTELP 5.5 (L) 07/05/2020   ALBUMINELP 3.0 07/05/2020   A1GS 0.3 07/05/2020   A2GS 0.8 07/05/2020   BETS 0.8 07/05/2020   BETA2SER 0.3 07/28/2015   GAMS 0.6 07/05/2020   MSPIKE 0.3 (H) 07/05/2020   SPEI Comment 07/05/2020     Chemistry      Component Value Date/Time   NA 138 07/05/2020 0902   NA 146 (H) 08/07/2017 1153   NA 141 01/09/2017 1004   K 4.4 07/05/2020 0902   K 4.6 08/07/2017 1153   K 4.4 01/09/2017 1004   CL 106 07/05/2020 0902   CL 108 08/07/2017 1153   CO2 25 07/05/2020 0902   CO2 26 08/07/2017 1153   CO2 22 01/09/2017 1004   BUN 44 (H) 07/05/2020 0902   BUN 24 (H) 08/07/2017 1153   BUN 23.9 01/09/2017 1004   CREATININE 1.60 (H) 07/05/2020 0902   CREATININE 1.56 (H) 05/26/2018 1508   CREATININE 1.5 (H) 01/09/2017 1004      Component Value Date/Time   CALCIUM  9.1 07/05/2020 0902   CALCIUM 8.8 08/07/2017 1153   CALCIUM 8.8 01/09/2017 1004   ALKPHOS 41 07/05/2020 0902   ALKPHOS 97 (H) 08/07/2017 1153   ALKPHOS 80 01/09/2017 1004   AST 29 07/05/2020 0902   AST 18 01/09/2017 1004   ALT 11 07/05/2020 0902   ALT 20 08/07/2017 1153   ALT 12 01/09/2017 1004   BILITOT 0.4 07/05/2020 0902   BILITOT 0.92 01/09/2017 1004       Impression and Plan: Mr. Olazabal is a very pleasant 86 yo caucasian gentleman with relapsed IgG lambda myeloma. We will proceed with treatment today as planned (Bendumistine and IVIG). Hgb is stable at 9.5. He will get his ESA today but no blood transfusion needed at this time.  Iron studies are pending. We will replace if needed.  Follow-up in 1 month.  He was encouraged to contact our office with any questions or concerns. We can certainly see him sooner if needed.   Laverna Peace, NP 12/22/202111:59 AM

## 2020-08-02 NOTE — Telephone Encounter (Signed)
Appointments scheduled a;emdar printed per 12/22 los

## 2020-08-02 NOTE — Patient Instructions (Signed)

## 2020-08-02 NOTE — Progress Notes (Signed)
Okay to treat with SCr = 1.64 per Dr. Marin Olp.

## 2020-08-03 LAB — IGG, IGA, IGM
IgA: 9 mg/dL — ABNORMAL LOW (ref 61–437)
IgG (Immunoglobin G), Serum: 774 mg/dL (ref 603–1613)
IgM (Immunoglobulin M), Srm: 48 mg/dL (ref 15–143)

## 2020-08-03 LAB — KAPPA/LAMBDA LIGHT CHAINS
Kappa free light chain: 9.3 mg/L (ref 3.3–19.4)
Kappa, lambda light chain ratio: 0.6 (ref 0.26–1.65)
Lambda free light chains: 15.6 mg/L (ref 5.7–26.3)

## 2020-08-03 LAB — IRON AND TIBC
Iron: 114 ug/dL (ref 42–163)
Saturation Ratios: 49 % (ref 20–55)
TIBC: 233 ug/dL (ref 202–409)
UIBC: 119 ug/dL (ref 117–376)

## 2020-08-03 LAB — FERRITIN: Ferritin: 881 ng/mL — ABNORMAL HIGH (ref 24–336)

## 2020-08-05 LAB — PROTEIN ELECTROPHORESIS, SERUM
A/G Ratio: 1.2 (ref 0.7–1.7)
Albumin ELP: 3 g/dL (ref 2.9–4.4)
Alpha-1-Globulin: 0.3 g/dL (ref 0.0–0.4)
Alpha-2-Globulin: 0.8 g/dL (ref 0.4–1.0)
Beta Globulin: 0.8 g/dL (ref 0.7–1.3)
Gamma Globulin: 0.8 g/dL (ref 0.4–1.8)
Globulin, Total: 2.6 g/dL (ref 2.2–3.9)
M-Spike, %: 0.2 g/dL — ABNORMAL HIGH
Total Protein ELP: 5.6 g/dL — ABNORMAL LOW (ref 6.0–8.5)

## 2020-08-07 ENCOUNTER — Other Ambulatory Visit: Payer: Self-pay | Admitting: Hematology & Oncology

## 2020-08-07 ENCOUNTER — Ambulatory Visit (INDEPENDENT_AMBULATORY_CARE_PROVIDER_SITE_OTHER): Payer: Medicare HMO

## 2020-08-07 DIAGNOSIS — I639 Cerebral infarction, unspecified: Secondary | ICD-10-CM

## 2020-08-07 DIAGNOSIS — G620 Drug-induced polyneuropathy: Secondary | ICD-10-CM

## 2020-08-08 LAB — CUP PACEART REMOTE DEVICE CHECK
Date Time Interrogation Session: 20211222230427
Implantable Pulse Generator Implant Date: 20200909

## 2020-08-10 ENCOUNTER — Other Ambulatory Visit: Payer: Self-pay | Admitting: Internal Medicine

## 2020-08-16 ENCOUNTER — Ambulatory Visit: Payer: Medicare HMO | Admitting: Nurse Practitioner

## 2020-08-16 ENCOUNTER — Encounter: Payer: Self-pay | Admitting: Nurse Practitioner

## 2020-08-16 VITALS — BP 143/80 | HR 67 | Ht 70.0 in | Wt 131.2 lb

## 2020-08-16 DIAGNOSIS — R1310 Dysphagia, unspecified: Secondary | ICD-10-CM | POA: Diagnosis not present

## 2020-08-16 NOTE — Progress Notes (Signed)
08/16/2020 FELIBERTO STOCKLEY [id number] [birth year]   Chief Complaint: ulcer follow up  History of Present Illness: Nazim Kadlec. Schwegler is an 87 year old malewith a past medical history of hypertension, CVA, CKD stage III, multiple myelomawith recurrence on Bendumistine and IVIG, jaw osteomyelitis, IDA and UGI bleed 09/2019 secondary to a bleeding duodenal ulcer, esophageal candidiasis and  pneumonia requiring hospitalization 09/28/2019. I last saw him in the office on 04/25/2020, at that time he was advised to reduce Protonix 14m from bid to QD due to having loose stools with concerns Protonix may be contributing to this bowel pattern but he did not do so. He presents today for his 3 month follow up. His grandson is not present today. He is taking Imodium once daily without recurrence of watery diarrhea. He is passing a normal solid brown stool once daily. No rectal bleeding or black stool. He has gained 5lbs. His weight today is 131lbs.However, he reports having a poor appetite.  He is drinking 2 boosts daily. No significant episodes of dysphagia but sometimes food or acid backs up into the upper esophagus. No vomiting. No heartburn.   Labs 08/02/2020: Iron 114. Ferritin 881. TIBC 233. Hg 9.5. HCT 28.8. MCV 93.8. PLT 124.   EGD 09/17/2019 by Dr. CBryan Lemma - Esophageal plaques were found, suspicious for candidiasis. Biopsied. - Non-obstructing and mild Schatzki ring. - Bleb found in the esophagus. - 2 cm hiatal hernia. - Gastritis. Biopsied. - Hematin (altered blood/coffee-ground-like material) in the entire stomach. - Oozing duodenal ulcers. Clip was placed. Biopsied. - Normal second portion of the duodenum.  Modified swallowing study 12/14/2019: SLP suspects patient's primary symptoms are due to decreased relaxation of UES.  Assessed to be a mild aspiration risk.   Current Outpatient Medications on File Prior to Visit  Medication Sig Dispense Refill  . amLODipine-benazepril (LOTREL) 5-10 MG  capsule TAKE ONE CAPSULE EACH DAY 30 capsule 2  . aspirin EC 81 MG tablet Take 1 tablet (81 mg total) by mouth daily. Swallow whole. 30 tablet 11  . dronabinol (MARINOL) 5 MG capsule TAKE ONE CAPSULE TWICE A DAY BEFORE A MEAL 60 capsule 2  . DULoxetine (CYMBALTA) 60 MG capsule TAKE ONE CAPSULE EACH DAY 30 capsule 3  . finasteride (PROSCAR) 5 MG tablet TAKE ONE TABLET EACH DAY (Patient taking differently: Take 5 mg by mouth daily.) 30 tablet 6  . lidocaine-prilocaine (EMLA) cream Apply 1 application topically as needed. 30 g 0  . metoprolol succinate (TOPROL-XL) 25 MG 24 hr tablet Take 25 mg by mouth daily.    . Multiple Vitamins-Minerals (QC MULTI-VITE 50 & OVER PO) TAKE ONE TABLET DAILY    . ondansetron (ZOFRAN) 4 MG tablet Take 1 tablet (4 mg total) by mouth every 6 (six) hours as needed for nausea. 20 tablet 0  . pantoprazole (PROTONIX) 40 MG tablet Take 1 tablet (40 mg total) by mouth 2 (two) times daily. 60 tablet 3  . penicillin v potassium (VEETID) 500 MG tablet TAKE ONE TABLET FOUR TIMES DAILY 120 tablet 1  . polyvinyl alcohol (LIQUIFILM TEARS) 1.4 % ophthalmic solution Place 1 drop into both eyes as needed for dry eyes. 15 mL 0  . predniSONE (DELTASONE) 20 MG tablet TAKE ONE TABLET EACH DAY WITH BREAKFAST 30 tablet 2  . Probiotic Product (PROBIOTIC PO) Take 1 capsule by mouth daily.    .Marland KitchenpyridOXINE (VITAMIN B-6) 100 MG tablet Take 100 mg by mouth daily.     . traMADol (ULTRAM) 50  MG tablet Take 1 tablet (50 mg total) by mouth every 8 (eight) hours as needed for moderate pain. 10 tablet 0  . vitamin B-12 (CYANOCOBALAMIN) 500 MCG tablet Take 500 mcg by mouth daily.    . Vitamin D, Ergocalciferol, (DRISDOL) 50000 UNITS CAPS capsule Take 50,000 Units by mouth every Sunday.     Marland Kitchen White Petrolatum-Mineral Oil (EYE LUBRICANT) OINT Apply 1 application to eye in the morning and at bedtime. If left eye lower eye lid is irritated 3.5 g 0   No current facility-administered medications on file prior  to visit.   Allergies  Allergen Reactions  . Sulfa Antibiotics Itching  . Sulfasalazine Itching    Current Medications, Allergies, Past Medical History, Past Surgical History, Family History and Social History were reviewed in [employer] record.   Review of Systems:   Constitutional: Negative for fever, sweats, chills or weight loss.  Respiratory: Negative for shortness of breath.   Cardiovascular: Negative for chest pain, palpitations and leg swelling.  Gastrointestinal: See HPI.  Musculoskeletal: Negative for back pain or muscle aches.  Neurological: Negative for dizziness, headaches or paresthesias.    Physical Exam: There were no vitals taken for this visit.  BP (!) 143/80   Pulse 67   Ht 5' 10"  (1.778 m)   Wt 131 lb 3.2 oz (59.5 kg)   SpO2 96%   BMI 18.83 kg/m   Wt Readings from Last 3 Encounters:  08/16/20 131 lb 3.2 oz (59.5 kg)  08/02/20 126 lb (57.2 kg)  07/05/20 127 lb (57.6 kg)   General: Thin 87 year old male in no acute distress. Head: Normocephalic and atraumatic. Eyes: No scleral icterus. Conjunctiva pink . Ears: Normal auditory acuity. Lungs: Clear throughout to auscultation. Heart: Regular rate and rhythm, no murmur. Abdomen: Soft, nontender and nondistended. No masses or hepatomegaly. Normal bowel sounds x 4 quadrants.  Rectal: Deferred.  Musculoskeletal: Symmetrical with no gross deformities. Extremities: No edema. Neurological: Alert oriented x 4. No focal deficits.  Psychological: Alert and cooperative. Normal mood and affect  Assessment and Recommendations:  48. 62. 87 year old male with a complex medical historyincluding multiple myeloma with UGI bleed and anemia 09/14/2019. Dysphagia. EGD 09/17/2019 candidiasis esophagitis, nonobstructing Schatzki's ring, blood in the esophagus, a 2 cm hiatal hernia, gastritis and hematin in the stomach and an oozing duodenal ulcer which was clipped. No evidence of H. Pylori. He was  prescribed Pantoprazole 40 mg p.o. twice daily x 8 weeks and Diflucan 400 mg p.o. x 1 then 200 mg p.o. daily for 21 days.   -Decrease Pantoprazole 40 mg once daily -Patient to call our office if dysphagia recurs -Follow up in the office in 3 to 4 months   2. Change in bowel pattern, urgent soft stools, sometimes loose stools, improved. He is taking Iodium one tab daily and mostly passing a solid stool daily. -Continue Imodium 1 tab daily.  3. Anemia, multifocal due to recurrent multiple myeloma on Bendamustin and IVIG, CKD, GI bleed. Hg 8.8 on 8/26 and he was transfused 1 unit of PRBCs 8/27. Labs 12/22 showed Hg level 9.5. Iron 114. No signs of active GI bleeding.  -Patient to continue follow up and labs with Dr. Marin Olp and Laverna Peace NP  4. Hypercalcemia secondary to # 3

## 2020-08-16 NOTE — Patient Instructions (Signed)
If you are age 85 or older, your body mass index should be between 23-30. Your Body mass index is 18.83 kg/m. If this is out of the aforementioned range listed, please consider follow up with your Primary Care Provider.  If you are age 41 or younger, your body mass index should be between 19-25. Your Body mass index is 18.83 kg/m. If this is out of the aformentioned range listed, please consider follow up with your Primary Care Provider.      Please record frequency of food backing up in your esophagus .  Call us if your symptoms worsen.  Reduce Protonix to 40 MG once a day.  Continue Imodium 1 tablet once a day as needed. STOP if NO bowel movement in 24 hours.  Please follow up in 3-4 months.   It was great seeing you today!  Thank you for entrusting me with your care and choosing [facility].  Alcide Evener, NP

## 2020-08-17 ENCOUNTER — Telehealth: Payer: Self-pay | Admitting: *Deleted

## 2020-08-17 NOTE — Progress Notes (Signed)
Carelink Summary Report / Loop Recorder 

## 2020-08-17 NOTE — Telephone Encounter (Signed)
Message received from patient requesting a call back regarding his medications.  Call placed back to patient and patient wanted to inform Dr. Myna Hidalgo that his GI office has decreased his Protonix daily.  Dr. Myna Hidalgo notified.

## 2020-08-22 NOTE — Progress Notes (Signed)
Agree with the assessment and plan as outlined by Colleen Kennedy-Smith, NP.   Bristal Steffy, DO, FACG [facility]   

## 2020-08-30 ENCOUNTER — Inpatient Hospital Stay: Payer: Medicare HMO

## 2020-08-30 ENCOUNTER — Telehealth: Payer: Self-pay

## 2020-08-30 ENCOUNTER — Other Ambulatory Visit: Payer: Medicare HMO

## 2020-08-30 ENCOUNTER — Other Ambulatory Visit: Payer: Self-pay

## 2020-08-30 ENCOUNTER — Inpatient Hospital Stay: Payer: Medicare HMO | Attending: Hematology & Oncology

## 2020-08-30 ENCOUNTER — Encounter: Payer: Self-pay | Admitting: Family

## 2020-08-30 ENCOUNTER — Ambulatory Visit: Payer: Medicare HMO

## 2020-08-30 ENCOUNTER — Ambulatory Visit: Payer: Medicare HMO | Admitting: Family

## 2020-08-30 ENCOUNTER — Inpatient Hospital Stay (HOSPITAL_BASED_OUTPATIENT_CLINIC_OR_DEPARTMENT_OTHER): Payer: Medicare HMO | Admitting: Family

## 2020-08-30 VITALS — BP 160/82 | HR 80 | Temp 97.9°F | Resp 18 | Ht 70.0 in | Wt 127.8 lb

## 2020-08-30 VITALS — BP 162/84 | HR 76 | Temp 97.8°F | Resp 18

## 2020-08-30 DIAGNOSIS — Z923 Personal history of irradiation: Secondary | ICD-10-CM | POA: Diagnosis not present

## 2020-08-30 DIAGNOSIS — D508 Other iron deficiency anemias: Secondary | ICD-10-CM

## 2020-08-30 DIAGNOSIS — C9000 Multiple myeloma not having achieved remission: Secondary | ICD-10-CM

## 2020-08-30 DIAGNOSIS — R5383 Other fatigue: Secondary | ICD-10-CM | POA: Insufficient documentation

## 2020-08-30 DIAGNOSIS — C9002 Multiple myeloma in relapse: Secondary | ICD-10-CM | POA: Diagnosis present

## 2020-08-30 DIAGNOSIS — D6481 Anemia due to antineoplastic chemotherapy: Secondary | ICD-10-CM | POA: Diagnosis not present

## 2020-08-30 DIAGNOSIS — Z5111 Encounter for antineoplastic chemotherapy: Secondary | ICD-10-CM | POA: Insufficient documentation

## 2020-08-30 DIAGNOSIS — Z79899 Other long term (current) drug therapy: Secondary | ICD-10-CM | POA: Insufficient documentation

## 2020-08-30 DIAGNOSIS — G620 Drug-induced polyneuropathy: Secondary | ICD-10-CM | POA: Insufficient documentation

## 2020-08-30 DIAGNOSIS — T451X5A Adverse effect of antineoplastic and immunosuppressive drugs, initial encounter: Secondary | ICD-10-CM | POA: Insufficient documentation

## 2020-08-30 DIAGNOSIS — D649 Anemia, unspecified: Secondary | ICD-10-CM | POA: Diagnosis not present

## 2020-08-30 DIAGNOSIS — R21 Rash and other nonspecific skin eruption: Secondary | ICD-10-CM | POA: Insufficient documentation

## 2020-08-30 DIAGNOSIS — Z882 Allergy status to sulfonamides status: Secondary | ICD-10-CM | POA: Insufficient documentation

## 2020-08-30 LAB — RETICULOCYTES
Immature Retic Fract: 5.4 % (ref 2.3–15.9)
RBC.: 3.27 MIL/uL — ABNORMAL LOW (ref 4.22–5.81)
Retic Count, Absolute: 23.9 10*3/uL (ref 19.0–186.0)
Retic Ct Pct: 0.7 % (ref 0.4–3.1)

## 2020-08-30 LAB — CBC WITH DIFFERENTIAL (CANCER CENTER ONLY)
Abs Immature Granulocytes: 0.03 10*3/uL (ref 0.00–0.07)
Basophils Absolute: 0 10*3/uL (ref 0.0–0.1)
Basophils Relative: 2 %
Eosinophils Absolute: 0.1 10*3/uL (ref 0.0–0.5)
Eosinophils Relative: 3 %
HCT: 31 % — ABNORMAL LOW (ref 39.0–52.0)
Hemoglobin: 10.2 g/dL — ABNORMAL LOW (ref 13.0–17.0)
Immature Granulocytes: 1 %
Lymphocytes Relative: 3 %
Lymphs Abs: 0.1 10*3/uL — ABNORMAL LOW (ref 0.7–4.0)
MCH: 30.7 pg (ref 26.0–34.0)
MCHC: 32.9 g/dL (ref 30.0–36.0)
MCV: 93.4 fL (ref 80.0–100.0)
Monocytes Absolute: 0.6 10*3/uL (ref 0.1–1.0)
Monocytes Relative: 28 %
Neutro Abs: 1.3 10*3/uL — ABNORMAL LOW (ref 1.7–7.7)
Neutrophils Relative %: 63 %
Platelet Count: 120 10*3/uL — ABNORMAL LOW (ref 150–400)
RBC: 3.32 MIL/uL — ABNORMAL LOW (ref 4.22–5.81)
RDW: 16.6 % — ABNORMAL HIGH (ref 11.5–15.5)
WBC Count: 2.1 10*3/uL — ABNORMAL LOW (ref 4.0–10.5)
nRBC: 0 % (ref 0.0–0.2)

## 2020-08-30 LAB — CMP (CANCER CENTER ONLY)
ALT: 15 U/L (ref 0–44)
AST: 33 U/L (ref 15–41)
Albumin: 3.4 g/dL — ABNORMAL LOW (ref 3.5–5.0)
Alkaline Phosphatase: 43 U/L (ref 38–126)
Anion gap: 6 (ref 5–15)
BUN: 54 mg/dL — ABNORMAL HIGH (ref 8–23)
CO2: 27 mmol/L (ref 22–32)
Calcium: 9.3 mg/dL (ref 8.9–10.3)
Chloride: 107 mmol/L (ref 98–111)
Creatinine: 1.77 mg/dL — ABNORMAL HIGH (ref 0.61–1.24)
GFR, Estimated: 36 mL/min — ABNORMAL LOW (ref >60)
Glucose, Bld: 81 mg/dL (ref 70–99)
Potassium: 4.3 mmol/L (ref 3.5–5.1)
Sodium: 140 mmol/L (ref 135–145)
Total Bilirubin: 0.6 mg/dL (ref 0.3–1.2)
Total Protein: 6.2 g/dL — ABNORMAL LOW (ref 6.5–8.1)

## 2020-08-30 LAB — LACTATE DEHYDROGENASE: LDH: 374 U/L — ABNORMAL HIGH (ref 98–192)

## 2020-08-30 LAB — SAMPLE TO BLOOD BANK

## 2020-08-30 MED ORDER — FAMOTIDINE IN NACL 20-0.9 MG/50ML-% IV SOLN
20.0000 mg | Freq: Once | INTRAVENOUS | Status: AC
  Administered 2020-08-30: 20 mg via INTRAVENOUS

## 2020-08-30 MED ORDER — SODIUM CHLORIDE 0.9 % IV SOLN
75.0000 mg/m2 | Freq: Once | INTRAVENOUS | Status: AC
  Administered 2020-08-30: 125 mg via INTRAVENOUS
  Filled 2020-08-30: qty 5

## 2020-08-30 MED ORDER — ACETAMINOPHEN 325 MG PO TABS
650.0000 mg | ORAL_TABLET | Freq: Once | ORAL | Status: AC
  Administered 2020-08-30: 650 mg via ORAL

## 2020-08-30 MED ORDER — FAMOTIDINE IN NACL 20-0.9 MG/50ML-% IV SOLN
INTRAVENOUS | Status: AC
  Filled 2020-08-30: qty 50

## 2020-08-30 MED ORDER — DIPHENHYDRAMINE HCL 25 MG PO CAPS
25.0000 mg | ORAL_CAPSULE | Freq: Once | ORAL | Status: AC
  Administered 2020-08-30: 25 mg via ORAL

## 2020-08-30 MED ORDER — DIPHENHYDRAMINE HCL 25 MG PO CAPS
ORAL_CAPSULE | ORAL | Status: AC
  Filled 2020-08-30: qty 1

## 2020-08-30 MED ORDER — SODIUM CHLORIDE 0.9% FLUSH
10.0000 mL | INTRAVENOUS | Status: DC | PRN
  Administered 2020-08-30: 10 mL
  Filled 2020-08-30: qty 10

## 2020-08-30 MED ORDER — SODIUM CHLORIDE 0.9 % IV SOLN
10.0000 mg | Freq: Once | INTRAVENOUS | Status: AC
  Administered 2020-08-30: 10 mg via INTRAVENOUS
  Filled 2020-08-30: qty 10

## 2020-08-30 MED ORDER — PALONOSETRON HCL INJECTION 0.25 MG/5ML
0.2500 mg | Freq: Once | INTRAVENOUS | Status: AC
  Administered 2020-08-30: 0.25 mg via INTRAVENOUS

## 2020-08-30 MED ORDER — IMMUNE GLOBULIN (HUMAN) 20 GM/200ML IV SOLN
40.0000 g | Freq: Once | INTRAVENOUS | Status: AC
  Administered 2020-08-30: 40 g via INTRAVENOUS
  Filled 2020-08-30: qty 400

## 2020-08-30 MED ORDER — ACETAMINOPHEN 325 MG PO TABS
ORAL_TABLET | ORAL | Status: AC
  Filled 2020-08-30: qty 2

## 2020-08-30 MED ORDER — SODIUM CHLORIDE 0.9 % IV SOLN
Freq: Once | INTRAVENOUS | Status: AC
  Filled 2020-08-30: qty 250

## 2020-08-30 MED ORDER — PALONOSETRON HCL INJECTION 0.25 MG/5ML
INTRAVENOUS | Status: AC
  Filled 2020-08-30: qty 5

## 2020-08-30 MED ORDER — HEPARIN SOD (PORK) LOCK FLUSH 100 UNIT/ML IV SOLN
500.0000 [IU] | Freq: Once | INTRAVENOUS | Status: AC | PRN
  Administered 2020-08-30: 500 [IU]
  Filled 2020-08-30: qty 5

## 2020-08-30 NOTE — Progress Notes (Signed)
Ok to treat with anc of 1.3 per dr Marin Olp. dph

## 2020-08-30 NOTE — Patient Instructions (Signed)
Bendamustine Injection What is this medicine? BENDAMUSTINE (BEN da MUS teen) is a chemotherapy drug. It is used to treat chronic lymphocytic leukemia and non-Hodgkin lymphoma. This medicine may be used for other purposes; ask your health care provider or pharmacist if you have questions. COMMON BRAND NAME(S): Kristine Royal, Treanda What should I tell my health care provider before I take this medicine? They need to know if you have any of these conditions:  infection (especially a virus infection such as chickenpox, cold sores, or herpes)  kidney disease  liver disease  an unusual or allergic reaction to bendamustine, mannitol, other medicines, foods, dyes, or preservatives  pregnant or trying to get pregnant  breast-feeding How should I use this medicine? This medicine is for infusion into a vein. It is given by a health care professional in a hospital or clinic setting. Talk to your pediatrician regarding the use of this medicine in children. Special care may be needed. Overdosage: If you think you have taken too much of this medicine contact a poison control center or emergency room at once. NOTE: This medicine is only for you. Do not share this medicine with others. What if I miss a dose? It is important not to miss your dose. Call your doctor or health care professional if you are unable to keep an appointment. What may interact with this medicine? Do not take this medicine with any of the following medications:  clozapine This medicine may also interact with the following medications:  atazanavir  cimetidine  ciprofloxacin  enoxacin  fluvoxamine  medicines for seizures like carbamazepine and phenobarbital  mexiletine  rifampin  tacrine  thiabendazole  zileuton This list may not describe all possible interactions. Give your health care provider a list of all the medicines, herbs, non-prescription drugs, or dietary supplements you use. Also tell them if  you smoke, drink alcohol, or use illegal drugs. Some items may interact with your medicine. What should I watch for while using this medicine? This drug may make you feel generally unwell. This is not uncommon, as chemotherapy can affect healthy cells as well as cancer cells. Report any side effects. Continue your course of treatment even though you feel ill unless your doctor tells you to stop. You may need blood work done while you are taking this medicine. Call your doctor or healthcare provider for advice if you get a fever, chills or sore throat, or other symptoms of a cold or flu. Do not treat yourself. This drug decreases your body's ability to fight infections. Try to avoid being around people who are sick. This medicine may cause serious skin reactions. They can happen weeks to months after starting the medicine. Contact your healthcare provider right away if you notice fevers or flu-like symptoms with a rash. The rash may be red or purple and then turn into blisters or peeling of the skin. Or, you might notice a red rash with swelling of the face, lips or lymph nodes in your neck or under your arms. In some patients, this medicine may cause a serious brain infection that may cause death. If you have any problems seeing, thinking, speaking, walking, or standing, tell your health care provider right away. If you cannot reach your health care provider, urgently seek other source of medical care. This medicine may increase your risk to bruise or bleed. Call your doctor or healthcare provider if you notice any unusual bleeding. Talk to your doctor about your risk of cancer. You may be more at  risk for certain types of cancers if you take this medicine. This medicine may increase your risk of skin cancer. Check your skin for changes to moles or for new growths while taking this medicine. Call your health care provider if you notice any of these skin changes. Do not become pregnant while taking this  medicine or for at least 6 months after stopping it. Women should inform their doctor if they wish to become pregnant or think they might be pregnant. Men should not father a child while taking this medicine and for at least 3 months after stopping it. There is a potential for serious side effects to an unborn child. Talk to your healthcare provider or pharmacist for more information. Do not breast-feed an infant while taking this medicine or for at least 1 week after stopping it. This medicine may make it more difficult to father a child. You should talk with your doctor or healthcare provider if you are concerned about your fertility. What side effects may I notice from receiving this medicine? Side effects that you should report to your doctor or health care professional as soon as possible:  allergic reactions like skin rash, itching or hives, swelling of the face, lips, or tongue  low blood counts - this medicine may decrease the number of white blood cells, red blood cells and platelets. You may be at increased risk for infections and bleeding.  rash, fever, and swollen lymph nodes  redness, blistering, peeling, or loosening of the skin, including inside the mouth  signs of infection like fever or chills, cough, sore throat, pain or difficulty passing urine  signs of decreased platelets or bleeding like bruising, pinpoint red spots on the skin, black, tarry stools, blood in the urine  signs of decreased red blood cells like being unusually weak or tired, fainting spells, lightheadedness  signs and symptoms of kidney injury like trouble passing urine or change in the amount of urine  signs and symptoms of liver injury like dark yellow or brown urine; general ill feeling or flu-like symptoms; light-colored stools; loss of appetite; nausea; right upper belly pain; unusually weak or tired; yellowing of the eyes or skin Side effects that usually do not require medical attention (report to your  doctor or health care professional if they continue or are bothersome):  constipation  decreased appetite  diarrhea  headache  mouth sores  nausea, vomiting  tiredness This list may not describe all possible side effects. Call your doctor for medical advice about side effects. You may report side effects to FDA at 1-800-FDA-1088. Where should I keep my medicine? This drug is given in a hospital or clinic and will not be stored at home. NOTE: This sheet is a summary. It may not cover all possible information. If you have questions about this medicine, talk to your doctor, pharmacist, or health care provider.  2021 Elsevier/Gold Standard (2020-01-24 12:11:43) Immune Globulin Injection What is this medicine? IMMUNE GLOBULIN (im MUNE GLOB yoo lin) helps to prevent or reduce the severity of certain infections in patients who are at risk. This medicine is collected from the pooled blood of many donors. It is used to treat immune system problems, thrombocytopenia, and Kawasaki syndrome. This medicine may be used for other purposes; ask your health care provider or pharmacist if you have questions. COMMON BRAND NAME(S): ASCENIV, Baygam, BIVIGAM, Carimune, Carimune NF, cutaquig, Cuvitru, Flebogamma, Flebogamma DIF, GamaSTAN, GamaSTAN S/D, Gamimune N, Gammagard, Gammagard S/D, Gammaked, Gammaplex, Gammar-P IV, Gamunex, Gamunex-C, Hizentra, Iveegam,  Iveegam EN, Octagam, Panglobulin, Panglobulin NF, panzyga, Polygam S/D, Privigen, Sandoglobulin, Venoglobulin-S, Vigam, Vivaglobulin, Xembify What should I tell my health care provider before I take this medicine? They need to know if you have any of these conditions:  diabetes  extremely low or no immune antibodies in the blood  heart disease  history of blood clots  hyperprolinemia  infection in the blood, sepsis  kidney disease  recently received or scheduled to receive a vaccination  an unusual or allergic reaction to human immune  globulin, albumin, maltose, sucrose, other medicines, foods, dyes, or preservatives  pregnant or trying to get pregnant  breast-feeding How should I use this medicine? This medicine is for injection into a muscle or infusion into a vein or skin. It is usually given by a health care professional in a hospital or clinic setting. In rare cases, some brands of this medicine might be given at home. You will be taught how to give this medicine. Use exactly as directed. Take your medicine at regular intervals. Do not take your medicine more often than directed. Talk to your pediatrician regarding the use of this medicine in children. While this drug may be prescribed for selected conditions, precautions do apply. Overdosage: If you think you have taken too much of this medicine contact a poison control center or emergency room at once. NOTE: This medicine is only for you. Do not share this medicine with others. What if I miss a dose? It is important not to miss your dose. Call your doctor or health care professional if you are unable to keep an appointment. If you give yourself the medicine and you miss a dose, take it as soon as you can. If it is almost time for your next dose, take only that dose. Do not take double or extra doses. What may interact with this medicine?  aspirin and aspirin-like medicines  cisplatin  cyclosporine  medicines for infection like acyclovir, adefovir, amphotericin B, bacitracin, cidofovir, foscarnet, ganciclovir, gentamicin, pentamidine, vancomycin  NSAIDS, medicines for pain and inflammation, like ibuprofen or naproxen  pamidronate  vaccines  zoledronic acid This list may not describe all possible interactions. Give your health care provider a list of all the medicines, herbs, non-prescription drugs, or dietary supplements you use. Also tell them if you smoke, drink alcohol, or use illegal drugs. Some items may interact with your medicine. What should I watch  for while using this medicine? Your condition will be monitored carefully while you are receiving this medicine. This medicine is made from pooled blood donations of many different people. It may be possible to pass an infection in this medicine. However, the donors are screened for infections and all products are tested for HIV and hepatitis. The medicine is treated to kill most or all bacteria and viruses. Talk to your doctor about the risks and benefits of this medicine. Do not have vaccinations for at least 14 days before, or until at least 3 months after receiving this medicine. What side effects may I notice from receiving this medicine? Side effects that you should report to your doctor or health care professional as soon as possible:  allergic reactions like skin rash, itching or hives, swelling of the face, lips, or tongue  blue colored lips or skin  breathing problems  chest pain or tightness  fever  signs and symptoms of aseptic meningitis such as stiff neck; sensitivity to light; headache; drowsiness; fever; nausea; vomiting; rash  signs and symptoms of a blood clot such  as chest pain; shortness of breath; pain, swelling, or warmth in the leg  signs and symptoms of hemolytic anemia such as fast heartbeat; tiredness; dark yellow or brown urine; or yellowing of the eyes or skin  signs and symptoms of kidney injury like trouble passing urine or change in the amount of urine  sudden weight gain  swelling of the ankles, feet, hands Side effects that usually do not require medical attention (report to your doctor or health care professional if they continue or are bothersome):  diarrhea  flushing  headache  increased sweating  joint pain  muscle cramps  muscle pain  nausea  pain, redness, or irritation at site where injected  tiredness This list may not describe all possible side effects. Call your doctor for medical advice about side effects. You may report  side effects to FDA at 1-800-FDA-1088. Where should I keep my medicine? Keep out of the reach of children. This drug is usually given in a hospital or clinic and will not be stored at home. In rare cases, some brands of this medicine may be given at home. If you are using this medicine at home, you will be instructed on how to store this medicine. Throw away any unused medicine after the expiration date on the label. NOTE: This sheet is a summary. It may not cover all possible information. If you have questions about this medicine, talk to your doctor, pharmacist, or health care provider.  2021 Elsevier/Gold Standard (2019-03-03 12:51:14)

## 2020-08-30 NOTE — Telephone Encounter (Signed)
Pt to rec 1-19 los appts in tx/avs today    Webb Silversmith

## 2020-08-30 NOTE — Progress Notes (Signed)
Hematology and Oncology Follow Up Visit  Christian Burns [id number] [birth year] 87 y.o. 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 Palliative radiation therapy to right ilium Kyprolis/Cytoxanq 3 week dosing- s/p cycle 24 (Cytoxan restarted on cycle 13) - DC'd due to progression Radiation therapy to right femur, 10 fraction --completed 3000 rad on 11/06/2018 Elotuzomab - started 10/29/2018 -- s/p cycle #1 -- d/c on 12/24/2018 Pomalidomide 2 mg po q day (21 on/7 off) - started 10/29/2018 -- d/c 12/31/2018 Daratumumab - start cycle # 1 on 12/31/2018 -- d/c on -02/04/2019  Current Therapy: Bendamustine/Decadron -- started on 02/10/2019, s/p cycle16-- Bendamustine given just 1 day Zometa IV q 4 weeks - permanently 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 He did not take his morning meds today before coming in for treatment but plans to take when he gets home.  He has fatigue but states that he is feeling food.  He was able to see GI 2 weeks ago and has cut back his Protonix to 40 mg PO once a day. He notes that his stool incontenance may be a little better. He is taking his Imodium as directed.  No episodes of bleeding. No abnormal bruising, no petechiae.   M-spike in December was stable at 0.2 g/dL, IgG level was 774 mg/dL and lambda light chains 1.56 mg/dL.  No fever, chills, n/v, cough, rash, dizziness, SOB, chest pain, palpitations, abdominal pain or changes in bowel or bladder habits.  He has had a little pain over the right  ankle bone that comes at night when he lays down in bed. He states that heat helps resolve the pain. No redness or edema noted at the site. He denies injury.  No falls or syncope to report.  Neuropathy is unchanged. He still notes it being a little more prominent in his fingertips that his feet.  He states that he has little to no appetite but is still eating. He enjoys a couple glasses of milk a day along with 2 Boost protein shakes a day. His weight is stable at 127 lbs. He is still working towards his goal of reaching 130 lbs.  He states that his Mohs surgeries for the spots on either side of his nose have been postponed to March and April.   ECOG Performance Status: 1 - Symptomatic but completely ambulatory  Medications:  Allergies as of 08/30/2020      Reactions   Sulfa Antibiotics Itching   Sulfasalazine Itching      Medication List       Accurate as of August 30, 2020  9:10 AM. If you have any questions, ask your nurse or doctor.        amLODipine-benazepril 5-10 MG capsule Commonly known as: LOTREL TAKE ONE CAPSULE EACH DAY   aspirin EC 81 MG tablet Take 1 tablet (81 mg total) by mouth daily. Swallow whole.   dronabinol 5 MG capsule Commonly known as: MARINOL TAKE ONE CAPSULE TWICE A DAY BEFORE A MEAL   DULoxetine 60 MG capsule Commonly known as: CYMBALTA TAKE ONE CAPSULE EACH DAY  Eye Lubricant Oint Apply 1 application to eye in the morning and at bedtime. If left eye lower eye lid is irritated   finasteride 5 MG tablet Commonly known as: PROSCAR TAKE ONE TABLET EACH DAY What changed: See the new instructions.   lidocaine-prilocaine cream Commonly known as: EMLA Apply 1 application topically as needed.   metoprolol succinate 25 MG 24 hr tablet Commonly known as: TOPROL-XL Take 25 mg by mouth daily.   ondansetron 4 MG tablet Commonly known as: ZOFRAN Take 1 tablet (4 mg total) by mouth every 6 (six) hours as needed for nausea.   pantoprazole 40 MG  tablet Commonly known as: Protonix Take 1 tablet (40 mg total) by mouth 2 (two) times daily.   penicillin v potassium 500 MG tablet Commonly known as: VEETID TAKE ONE TABLET FOUR TIMES DAILY   polyvinyl alcohol 1.4 % ophthalmic solution Commonly known as: LIQUIFILM TEARS Place 1 drop into both eyes as needed for dry eyes.   predniSONE 20 MG tablet Commonly known as: DELTASONE TAKE ONE TABLET EACH DAY WITH BREAKFAST   PROBIOTIC PO Take 1 capsule by mouth daily.   pyridOXINE 100 MG tablet Commonly known as: VITAMIN B-6 Take 100 mg by mouth daily.   QC MULTI-VITE 50 & OVER PO TAKE ONE TABLET DAILY   traMADol 50 MG tablet Commonly known as: ULTRAM Take 1 tablet (50 mg total) by mouth every 8 (eight) hours as needed for moderate pain.   vitamin B-12 500 MCG tablet Commonly known as: CYANOCOBALAMIN Take 500 mcg by mouth daily.   Vitamin D (Ergocalciferol) 1.25 MG (50000 UNIT) Caps capsule Commonly known as: DRISDOL Take 50,000 Units by mouth every Sunday.       Allergies:  Allergies  Allergen Reactions  . Sulfa Antibiotics Itching  . Sulfasalazine Itching    Past Medical History, Surgical history, Social history, and Family History were reviewed and updated.  Review of Systems: All other 10 point review of systems is negative.   Physical Exam:  height is 5\' 10"  (1.778 m) and weight is 127 lb 12.8 oz (58 kg). His oral temperature is 97.9 F (36.6 C). His blood pressure is 160/82 (abnormal) and his pulse is 80. His respiration is 18 and oxygen saturation is 100%.   Wt Readings from Last 3 Encounters:  08/30/20 127 lb 12.8 oz (58 kg)  08/16/20 131 lb 3.2 oz (59.5 kg)  08/02/20 126 lb (57.2 kg)    Ocular: Sclerae unicteric, pupils equal, round and reactive to light Ear-nose-throat: Oropharynx clear, dentition fair Lymphatic: No cervical or supraclavicular adenopathy Lungs no rales or rhonchi, good excursion bilaterally Heart regular rate and rhythm, no murmur  appreciated Abd soft, nontender, positive bowel sounds MSK no focal spinal tenderness, no joint edema Neuro: non-focal, well-oriented, appropriate affect Breasts: Deferred   Lab Results  Component Value Date   WBC 2.1 (L) 08/30/2020   HGB 10.2 (L) 08/30/2020   HCT 31.0 (L) 08/30/2020   MCV 93.4 08/30/2020   PLT 120 (L) 08/30/2020   Lab Results  Component Value Date   FERRITIN 881 (H) 08/02/2020   IRON 114 08/02/2020   TIBC 233 08/02/2020   UIBC 119 08/02/2020   IRONPCTSAT 49 08/02/2020   Lab Results  Component Value Date   RETICCTPCT 0.7 08/30/2020   RBC 3.27 (L) 08/30/2020   Lab Results  Component Value Date   KPAFRELGTCHN 9.3 08/02/2020   LAMBDASER 15.6 08/02/2020   KAPLAMBRATIO 0.60 08/02/2020   Lab Results  Component Value  Date   HDQQIWLN 989 08/02/2020   IGA 9 (L) 08/02/2020   IGMSERUM 48 08/02/2020   Lab Results  Component Value Date   TOTALPROTELP 5.6 (L) 08/02/2020   ALBUMINELP 3.0 08/02/2020   A1GS 0.3 08/02/2020   A2GS 0.8 08/02/2020   BETS 0.8 08/02/2020   BETA2SER 0.3 07/28/2015   GAMS 0.8 08/02/2020   MSPIKE 0.2 (H) 08/02/2020   SPEI Comment 08/02/2020     Chemistry      Component Value Date/Time   NA 139 08/02/2020 1130   NA 146 (H) 08/07/2017 1153   NA 141 01/09/2017 1004   K 4.1 08/02/2020 1130   K 4.6 08/07/2017 1153   K 4.4 01/09/2017 1004   CL 105 08/02/2020 1130   CL 108 08/07/2017 1153   CO2 26 08/02/2020 1130   CO2 26 08/07/2017 1153   CO2 22 01/09/2017 1004   BUN 57 (H) 08/02/2020 1130   BUN 24 (H) 08/07/2017 1153   BUN 23.9 01/09/2017 1004   CREATININE 1.64 (H) 08/02/2020 1130   CREATININE 1.56 (H) 05/26/2018 1508   CREATININE 1.5 (H) 01/09/2017 1004      Component Value Date/Time   CALCIUM 9.4 08/02/2020 1130   CALCIUM 8.8 08/07/2017 1153   CALCIUM 8.8 01/09/2017 1004   ALKPHOS 42 08/02/2020 1130   ALKPHOS 97 (H) 08/07/2017 1153   ALKPHOS 80 01/09/2017 1004   AST 30 08/02/2020 1130   AST 18 01/09/2017 1004   ALT  14 08/02/2020 1130   ALT 20 08/07/2017 1153   ALT 12 01/09/2017 1004   BILITOT 0.4 08/02/2020 1130   BILITOT 0.92 01/09/2017 1004       Impression and Plan: Mr. Creekmore is a very pleasant 87 yo caucasian gentleman with relapsed IgG lambda myeloma. I went of his assessment and labs with MD and ok to proceed with treatment per Dr. Marin Olp. Orders are signed.  No transfusion needed at this time. Hgb is now up to 10.2.  Iron studies are pending. We will replace if needed.  Follow-up in 1 month.  He was encouraged as always top contact our office with any questions or concerns and to call EMS in the event of an emergency.   Laverna Peace, NP 1/19/20229:10 AM

## 2020-08-30 NOTE — Patient Instructions (Signed)

## 2020-08-30 NOTE — Progress Notes (Signed)
Pt declined to stay for post infusion observation period. Pt stated he has tolerated medication multiple times prior without difficulty. Pt aware to call clinic with any questions or concerns. Pt verbalized understanding and had no further questions.   

## 2020-08-31 LAB — IRON AND TIBC
Iron: 108 ug/dL (ref 42–163)
Saturation Ratios: 46 % (ref 20–55)
TIBC: 235 ug/dL (ref 202–409)
UIBC: 127 ug/dL (ref 117–376)

## 2020-08-31 LAB — PROTEIN ELECTROPHORESIS, SERUM
A/G Ratio: 1.2 (ref 0.7–1.7)
Albumin ELP: 3 g/dL (ref 2.9–4.4)
Alpha-1-Globulin: 0.2 g/dL (ref 0.0–0.4)
Alpha-2-Globulin: 0.7 g/dL (ref 0.4–1.0)
Beta Globulin: 0.8 g/dL (ref 0.7–1.3)
Gamma Globulin: 0.9 g/dL (ref 0.4–1.8)
Globulin, Total: 2.6 g/dL (ref 2.2–3.9)
M-Spike, %: 0.2 g/dL — ABNORMAL HIGH
Total Protein ELP: 5.6 g/dL — ABNORMAL LOW (ref 6.0–8.5)

## 2020-08-31 LAB — KAPPA/LAMBDA LIGHT CHAINS
Kappa free light chain: 19.3 mg/L (ref 3.3–19.4)
Kappa, lambda light chain ratio: 0.61 (ref 0.26–1.65)
Lambda free light chains: 31.7 mg/L — ABNORMAL HIGH (ref 5.7–26.3)

## 2020-08-31 LAB — IGG, IGA, IGM
IgA: 8 mg/dL — ABNORMAL LOW (ref 61–437)
IgG (Immunoglobin G), Serum: 875 mg/dL (ref 603–1613)
IgM (Immunoglobulin M), Srm: 249 mg/dL — ABNORMAL HIGH (ref 15–143)

## 2020-08-31 LAB — FERRITIN: Ferritin: 730 ng/mL — ABNORMAL HIGH (ref 24–336)

## 2020-09-06 ENCOUNTER — Telehealth: Payer: Self-pay

## 2020-09-06 NOTE — Telephone Encounter (Signed)
returned pts call as he has a transport conflict with his 0/98/11 appt, appt has been r/s and pt is aware of new appt 09/28/20   Christian Burns

## 2020-09-11 ENCOUNTER — Ambulatory Visit (INDEPENDENT_AMBULATORY_CARE_PROVIDER_SITE_OTHER): Payer: Medicare HMO

## 2020-09-11 DIAGNOSIS — I63412 Cerebral infarction due to embolism of left middle cerebral artery: Secondary | ICD-10-CM

## 2020-09-11 LAB — CUP PACEART REMOTE DEVICE CHECK
Date Time Interrogation Session: 20220129230542
Implantable Pulse Generator Implant Date: 20200909

## 2020-09-20 ENCOUNTER — Other Ambulatory Visit: Payer: Self-pay

## 2020-09-20 ENCOUNTER — Encounter: Payer: Self-pay | Admitting: Gastroenterology

## 2020-09-20 ENCOUNTER — Ambulatory Visit: Payer: Medicare HMO | Admitting: Gastroenterology

## 2020-09-20 VITALS — BP 144/70 | HR 76 | Ht 69.0 in | Wt 129.1 lb

## 2020-09-20 DIAGNOSIS — R1310 Dysphagia, unspecified: Secondary | ICD-10-CM | POA: Diagnosis not present

## 2020-09-20 NOTE — Progress Notes (Signed)
09/20/2020 Christian Burns [id number] 1930-02-08   HISTORY OF PRESENT ILLNESS: This is a pleasant 87 year old male who is a patient of Dr. Vivia Ewing.  He has a past medical history of hypertension, CVA, CKD stage III, multiple myeloma with recurrence on Bendumistine and IVIG, jaw osteomyelitis, IDA and UGI bleed 09/2019 secondary to a bleeding duodenal ulcer, esophageal candidiasis and  pneumonia requiring hospitalization 09/28/2019.   He presents here today with complaints of dysphagia/food getting stuck in his throat.  He describes actually having a hard time swallowing and actually getting the food to move from his mouth at times.  He says that most of the time it seems to get stuck in his throat and then will come back up on occasion.  He feels that it is getting worse.  His weight is down 2 pounds from when he was seen about 13 months ago.  EGD 09/17/2019 by Dr. Bryan Lemma: - Esophageal plaques were found, suspicious for candidiasis. Biopsied. (Treated with diflucan). - Non-obstructing and mild Schatzki ring. - Bleb found in the esophagus. - 2 cm hiatal hernia. - Gastritis. Biopsied. - Hematin (altered blood/coffee-ground-like material) in the entire stomach. - Oozing duodenal ulcers. Clip was placed. Biopsied. - Normal second portion of the duodenum.  Modified swallowing study 12/14/2019: SLPsuspects patient's primary symptoms are due to decreased relaxation of UES.Assessed to be a mild aspiration risk.    Past Medical History:  Diagnosis Date  . Anemia   . Blood transfusion without reported diagnosis   . CKD (chronic kidney disease) 07/06/2018   CKD stage III  . Goals of care, counseling/discussion 02/04/2019  . History of radiation therapy 06/17/13-07/06/13   35 gray to upper lumbar spine  . 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 Hypertension   . Inguinal hernia   . Multiple myeloma [facility]) Sept 2010  . Multiple myeloma in relapse ([place]) 04/05/2016  . Radiation  09/26/15-10/20/15   lower thoracic spine, upper lumbar spine 30 gray  . Stroke [facility])    Past Surgical History:  Procedure Laterality Date  . COLONOSCOPY    . HIP PINNING,CANNULATED Right 09/01/2018   Procedure: [facility] NAIL HIP PINNING;  Surgeon: Melrose Nakayama, MD;  Location: [place];  Service: Orthopedics;  Laterality: Right;  . IR IMAGING GUIDED PORT INSERTION  11/01/2019  . LOOP RECORDER INSERTION N/A 04/21/2019   Procedure: LOOP RECORDER INSERTION;  Surgeon: Evans Lance, MD;  Location: [place] CV LAB;  Service: Cardiovascular;  Laterality: N/A;  . TEE WITHOUT CARDIOVERSION N/A 10/09/2018   Procedure: TRANSESOPHAGEAL ECHOCARDIOGRAM (TEE);  Surgeon: Acie Fredrickson Wonda Cheng, MD;  Location: [facility] ENDOSCOPY;  Service: Cardiovascular;  Laterality: N/A;  . TONSILLECTOMY      reports that he has never smoked. He has never used smokeless tobacco. He reports previous alcohol use of about 4.0 standard drinks of alcohol per week. He reports that he does not use drugs. family history includes Hypertension in his father and mother. Allergies  Allergen Reactions  . Sulfa Antibiotics Itching  . Sulfasalazine Itching      Outpatient Encounter Medications as of 09/20/2020  Medication Sig  . amLODipine-benazepril (LOTREL) 5-10 MG capsule TAKE ONE CAPSULE EACH DAY  . aspirin EC 81 MG tablet Take 1 tablet (81 mg total) by mouth daily. Swallow whole.  . dronabinol (MARINOL) 5 MG capsule TAKE ONE CAPSULE TWICE A DAY BEFORE A MEAL  . DULoxetine (CYMBALTA) 60 MG capsule TAKE ONE CAPSULE EACH DAY  . finasteride (PROSCAR) 5 MG tablet TAKE ONE  TABLET EACH DAY (Patient taking differently: Take 5 mg by mouth daily.)  . lidocaine-prilocaine (EMLA) cream Apply 1 application topically as needed.  . metoprolol succinate (TOPROL-XL) 25 MG 24 hr tablet Take 25 mg by mouth daily.  . Multiple Vitamins-Minerals (QC MULTI-VITE 50 & OVER PO) TAKE ONE TABLET DAILY  . ondansetron (ZOFRAN) 4 MG tablet Take 1 tablet (4 mg total) by  mouth every 6 (six) hours as needed for nausea.  . pantoprazole (PROTONIX) 40 MG tablet Take 1 tablet (40 mg total) by mouth 2 (two) times daily.  . penicillin v potassium (VEETID) 500 MG tablet TAKE ONE TABLET FOUR TIMES DAILY  . polyvinyl alcohol (LIQUIFILM TEARS) 1.4 % ophthalmic solution Place 1 drop into both eyes as needed for dry eyes.  . predniSONE (DELTASONE) 20 MG tablet TAKE ONE TABLET EACH DAY WITH BREAKFAST  . Probiotic Product (PROBIOTIC PO) Take 1 capsule by mouth daily.  Marland Kitchen pyridOXINE (VITAMIN B-6) 100 MG tablet Take 100 mg by mouth daily.   . traMADol (ULTRAM) 50 MG tablet Take 1 tablet (50 mg total) by mouth every 8 (eight) hours as needed for moderate pain.  . vitamin B-12 (CYANOCOBALAMIN) 500 MCG tablet Take 500 mcg by mouth daily.  . Vitamin D, Ergocalciferol, (DRISDOL) 50000 UNITS CAPS capsule Take 50,000 Units by mouth every Sunday.   Marland Kitchen White Petrolatum-Mineral Oil (EYE LUBRICANT) OINT Apply 1 application to eye in the morning and at bedtime. If left eye lower eye lid is irritated   No facility-administered encounter medications on file as of 09/20/2020.     REVIEW OF SYSTEMS  : All other systems reviewed and negative except where noted in the History of Present Illness.   PHYSICAL EXAM: BP (!) 144/70 (BP Location: Left Arm, Patient Position: Sitting, Cuff Size: Normal)   Pulse 76   Ht 5' 9"  (1.753 m) Comment: height measured without shoes  Wt 129 lb 2 oz (58.6 kg)   BMI 19.07 kg/m  General: Well developed white male in no acute distress Head: Normocephalic and atraumatic Eyes:  Sclerae anicteric, conjunctiva pink. Ears: Normal auditory acuity Lungs: Clear throughout to auscultation; no W/R/R. Heart: Regular rate and rhythm; no M/R/G. Abdomen: Soft, non-distended.  BS present.  Non-tender. Musculoskeletal:  Symmetrical with no gross deformities  Skin: No lesions on visible extremities Extremities: No edema  Neurological: Alert oriented x 4, grossly  non-focal Psychological:  Alert and cooperative. Normal mood and affect  ASSESSMENT AND PLAN: *Dysphagia: This has been an ongoing issue and sounds definitely more like an oropharyngeal dysphagia.  He had no improvement after EGD last year and treatment of his candidiasis.  He does not report food getting stuck down in his chest, actually describes more what sounds like transfer dysphagia at times.  We will plan for an esophagram to rule out any major esophageal issues, but then may benefit from follow-up with speech and language pathology.   CC:  Leanna Battles, MD

## 2020-09-20 NOTE — Patient Instructions (Addendum)
If you are age 85 or older, your body mass index should be between 23-30. Your Body mass index is 19.07 kg/m. If this is out of the aforementioned range listed, please consider follow up with your Primary Care Provider.  If you are age 59 or younger, your body mass index should be between 19-25. Your Body mass index is 19.07 kg/m. If this is out of the aformentioned range listed, please consider follow up with your Primary Care Provider.   Start Booster or Ensure 3 times daily if able.   You have been scheduled for a Barium Esophogram at [facility] (1st floor of the hospital) on Tuesday 09/26/20 at 11 am. Please arrive 15 minutes prior to your appointment for registration. Make certain not to have anything to eat or drink 3 hours prior to your test. If you need to reschedule for any reason, please contact radiology at [phone] to do so. _______________________________________________________________ A barium swallow is an examination that concentrates on views of the esophagus. This tends to be a double contrast exam (barium and two liquids which, when combined, create a gas to distend the wall of the oesophagus) or single contrast (non-ionic iodine based). The study is usually tailored to your symptoms so a good history is essential. Attention is paid during the study to the form, structure and configuration of the esophagus, looking for functional disorders (such as aspiration, dysphagia, achalasia, motility and reflux) EXAMINATION You may be asked to change into a gown, depending on the type of swallow being performed. A radiologist and radiographer will perform the procedure. The radiologist will advise you of the type of contrast selected for your procedure and direct you during the exam. You will be asked to stand, sit or lie in several different positions and to hold a small amount of fluid in your mouth before being asked to swallow while the imaging is performed .In some  instances you may be asked to swallow barium coated marshmallows to assess the motility of a solid food bolus. The exam can be recorded as a digital or video fluoroscopy procedure. POST PROCEDURE It will take 1-2 days for the barium to pass through your system. To facilitate this, it is important, unless otherwise directed, to increase your fluids for the next 24-48hrs and to resume your normal diet.  This test typically takes about 30 minutes to perform. ___________________________________________________________

## 2020-09-20 NOTE — Progress Notes (Signed)
Carelink Summary Report / Loop Recorder 

## 2020-09-22 NOTE — Progress Notes (Signed)
Agree with the assessment and plan as outlined by Jessica Zehr, PA-C. ? ?Alika Eppes, DO, FACG ? ?

## 2020-09-26 ENCOUNTER — Ambulatory Visit (HOSPITAL_COMMUNITY): Payer: Medicare HMO

## 2020-09-26 ENCOUNTER — Ambulatory Visit: Payer: Medicare HMO | Admitting: Hematology & Oncology

## 2020-09-26 ENCOUNTER — Ambulatory Visit: Payer: Medicare HMO

## 2020-09-26 ENCOUNTER — Other Ambulatory Visit: Payer: Medicare HMO

## 2020-09-27 ENCOUNTER — Emergency Department (HOSPITAL_COMMUNITY): Payer: Medicare HMO

## 2020-09-27 ENCOUNTER — Observation Stay (HOSPITAL_COMMUNITY)
Admission: EM | Admit: 2020-09-27 | Discharge: 2020-09-29 | Disposition: A | Discharge status: Home / Self Care | Payer: Medicare HMO | Attending: Internal Medicine | Admitting: Internal Medicine

## 2020-09-27 ENCOUNTER — Encounter (HOSPITAL_COMMUNITY): Payer: Self-pay

## 2020-09-27 ENCOUNTER — Other Ambulatory Visit: Payer: Self-pay

## 2020-09-27 ENCOUNTER — Other Ambulatory Visit: Payer: Self-pay | Admitting: Gastroenterology

## 2020-09-27 ENCOUNTER — Telehealth: Payer: Self-pay | Admitting: *Deleted

## 2020-09-27 ENCOUNTER — Ambulatory Visit (HOSPITAL_COMMUNITY)
Admission: RE | Admit: 2020-09-27 | Discharge: 2020-09-27 | Disposition: A | Discharge status: Home / Self Care | Payer: Medicare HMO | Source: Ambulatory Visit | Attending: Gastroenterology | Admitting: Gastroenterology

## 2020-09-27 DIAGNOSIS — I10 Essential (primary) hypertension: Secondary | ICD-10-CM | POA: Diagnosis present

## 2020-09-27 DIAGNOSIS — H1132 Conjunctival hemorrhage, left eye: Secondary | ICD-10-CM | POA: Diagnosis not present

## 2020-09-27 DIAGNOSIS — R1310 Dysphagia, unspecified: Secondary | ICD-10-CM | POA: Insufficient documentation

## 2020-09-27 DIAGNOSIS — Z79899 Other long term (current) drug therapy: Secondary | ICD-10-CM | POA: Diagnosis not present

## 2020-09-27 DIAGNOSIS — Z96641 Presence of right artificial hip joint: Secondary | ICD-10-CM | POA: Insufficient documentation

## 2020-09-27 DIAGNOSIS — N1832 Chronic kidney disease, stage 3b: Secondary | ICD-10-CM | POA: Insufficient documentation

## 2020-09-27 DIAGNOSIS — I129 Hypertensive chronic kidney disease with stage 1 through stage 4 chronic kidney disease, or unspecified chronic kidney disease: Secondary | ICD-10-CM | POA: Diagnosis not present

## 2020-09-27 DIAGNOSIS — J690 Pneumonitis due to inhalation of food and vomit: Secondary | ICD-10-CM | POA: Diagnosis present

## 2020-09-27 DIAGNOSIS — T17908A Unspecified foreign body in respiratory tract, part unspecified causing other injury, initial encounter: Secondary | ICD-10-CM

## 2020-09-27 DIAGNOSIS — T65891A Toxic effect of other specified substances, accidental (unintentional), initial encounter: Principal | ICD-10-CM | POA: Insufficient documentation

## 2020-09-27 DIAGNOSIS — D508 Other iron deficiency anemias: Secondary | ICD-10-CM | POA: Insufficient documentation

## 2020-09-27 DIAGNOSIS — Z7982 Long term (current) use of aspirin: Secondary | ICD-10-CM | POA: Insufficient documentation

## 2020-09-27 DIAGNOSIS — D61818 Other pancytopenia: Secondary | ICD-10-CM | POA: Diagnosis present

## 2020-09-27 DIAGNOSIS — C9002 Multiple myeloma in relapse: Secondary | ICD-10-CM | POA: Insufficient documentation

## 2020-09-27 DIAGNOSIS — C9000 Multiple myeloma not having achieved remission: Secondary | ICD-10-CM | POA: Diagnosis present

## 2020-09-27 DIAGNOSIS — N183 Chronic kidney disease, stage 3 unspecified: Secondary | ICD-10-CM | POA: Diagnosis present

## 2020-09-27 DIAGNOSIS — Z20822 Contact with and (suspected) exposure to covid-19: Secondary | ICD-10-CM | POA: Insufficient documentation

## 2020-09-27 DIAGNOSIS — E43 Unspecified severe protein-calorie malnutrition: Secondary | ICD-10-CM | POA: Diagnosis present

## 2020-09-27 DIAGNOSIS — J698 Pneumonitis due to inhalation of other solids and liquids: Secondary | ICD-10-CM | POA: Diagnosis not present

## 2020-09-27 LAB — CBC WITH DIFFERENTIAL/PLATELET
Abs Immature Granulocytes: 0.18 10*3/uL — ABNORMAL HIGH (ref 0.00–0.07)
Basophils Absolute: 0 10*3/uL (ref 0.0–0.1)
Basophils Relative: 1 %
Eosinophils Absolute: 0 10*3/uL (ref 0.0–0.5)
Eosinophils Relative: 1 %
HCT: 26.3 % — ABNORMAL LOW (ref 39.0–52.0)
Hemoglobin: 8.4 g/dL — ABNORMAL LOW (ref 13.0–17.0)
Immature Granulocytes: 7 %
Lymphocytes Relative: 2 %
Lymphs Abs: 0.1 10*3/uL — ABNORMAL LOW (ref 0.7–4.0)
MCH: 30.4 pg (ref 26.0–34.0)
MCHC: 31.9 g/dL (ref 30.0–36.0)
MCV: 95.3 fL (ref 80.0–100.0)
Monocytes Absolute: 0.5 10*3/uL (ref 0.1–1.0)
Monocytes Relative: 19 %
Neutro Abs: 1.8 10*3/uL (ref 1.7–7.7)
Neutrophils Relative %: 70 %
Platelets: 107 10*3/uL — ABNORMAL LOW (ref 150–400)
RBC: 2.76 MIL/uL — ABNORMAL LOW (ref 4.22–5.81)
RDW: 16.7 % — ABNORMAL HIGH (ref 11.5–15.5)
WBC: 2.5 10*3/uL — ABNORMAL LOW (ref 4.0–10.5)
nRBC: 0 % (ref 0.0–0.2)

## 2020-09-27 LAB — BASIC METABOLIC PANEL
Anion gap: 8 (ref 5–15)
BUN: 43 mg/dL — ABNORMAL HIGH (ref 8–23)
CO2: 23 mmol/L (ref 22–32)
Calcium: 8.5 mg/dL — ABNORMAL LOW (ref 8.9–10.3)
Chloride: 108 mmol/L (ref 98–111)
Creatinine, Ser: 1.49 mg/dL — ABNORMAL HIGH (ref 0.61–1.24)
GFR, Estimated: 45 mL/min — ABNORMAL LOW (ref >60)
Glucose, Bld: 127 mg/dL — ABNORMAL HIGH (ref 70–99)
Potassium: 4.2 mmol/L (ref 3.5–5.1)
Sodium: 139 mmol/L (ref 135–145)

## 2020-09-27 LAB — RESP PANEL BY RT-PCR (FLU A&B, COVID) ARPGX2
Influenza A by PCR: NEGATIVE
Influenza B by PCR: NEGATIVE
SARS Coronavirus 2 by RT PCR: NEGATIVE

## 2020-09-27 MED ORDER — ALBUTEROL SULFATE (2.5 MG/3ML) 0.083% IN NEBU
2.5000 mg | INHALATION_SOLUTION | Freq: Four times a day (QID) | RESPIRATORY_TRACT | Status: DC
  Administered 2020-09-27: 2.5 mg via RESPIRATORY_TRACT
  Filled 2020-09-27: qty 3

## 2020-09-27 MED ORDER — ACETAMINOPHEN 650 MG RE SUPP: 650.0000 mg | Freq: Four times a day (QID) | RECTAL | Status: DC | PRN

## 2020-09-27 MED ORDER — DULOXETINE HCL 60 MG PO CPEP
60.0000 mg | ORAL_CAPSULE | Freq: Every day | ORAL | Status: DC
  Administered 2020-09-27 – 2020-09-29 (×3): 60 mg via ORAL
  Filled 2020-09-27: qty 1
  Filled 2020-09-27: qty 2
  Filled 2020-09-27: qty 1

## 2020-09-27 MED ORDER — PENICILLIN V POTASSIUM 250 MG PO TABS
500.0000 mg | ORAL_TABLET | Freq: Four times a day (QID) | ORAL | Status: DC
  Administered 2020-09-27 – 2020-09-29 (×7): 500 mg via ORAL
  Filled 2020-09-27 (×7): qty 2
  Filled 2020-09-27: qty 1
  Filled 2020-09-27: qty 2

## 2020-09-27 MED ORDER — ASPIRIN EC 81 MG PO TBEC
81.0000 mg | DELAYED_RELEASE_TABLET | Freq: Every day | ORAL | Status: DC
  Administered 2020-09-27 – 2020-09-29 (×3): 81 mg via ORAL
  Filled 2020-09-27 (×3): qty 1

## 2020-09-27 MED ORDER — TRAMADOL HCL 50 MG PO TABS: 50.0000 mg | ORAL_TABLET | Freq: Three times a day (TID) | ORAL | Status: DC | PRN

## 2020-09-27 MED ORDER — ENOXAPARIN SODIUM 30 MG/0.3ML ~~LOC~~ SOLN
30.0000 mg | SUBCUTANEOUS | Status: DC
  Administered 2020-09-27 – 2020-09-28 (×2): 30 mg via SUBCUTANEOUS
  Filled 2020-09-27 (×2): qty 0.3

## 2020-09-27 MED ORDER — PANTOPRAZOLE SODIUM 40 MG PO TBEC
40.0000 mg | DELAYED_RELEASE_TABLET | Freq: Two times a day (BID) | ORAL | Status: DC
  Administered 2020-09-27 – 2020-09-29 (×4): 40 mg via ORAL
  Filled 2020-09-27 (×4): qty 1

## 2020-09-27 MED ORDER — SENNA 8.6 MG PO TABS
1.0000 | ORAL_TABLET | Freq: Two times a day (BID) | ORAL | Status: DC
  Administered 2020-09-27 – 2020-09-29 (×3): 8.6 mg via ORAL
  Filled 2020-09-27 (×4): qty 1

## 2020-09-27 MED ORDER — METOPROLOL SUCCINATE ER 25 MG PO TB24
25.0000 mg | ORAL_TABLET | Freq: Every day | ORAL | Status: DC
  Administered 2020-09-27 – 2020-09-29 (×3): 25 mg via ORAL
  Filled 2020-09-27 (×3): qty 1

## 2020-09-27 MED ORDER — ACETAMINOPHEN 325 MG PO TABS: 650.0000 mg | ORAL_TABLET | Freq: Four times a day (QID) | ORAL | Status: DC | PRN

## 2020-09-27 MED ORDER — ALBUTEROL SULFATE (2.5 MG/3ML) 0.083% IN NEBU
2.5000 mg | INHALATION_SOLUTION | Freq: Two times a day (BID) | RESPIRATORY_TRACT | Status: DC
  Administered 2020-09-28: 2.5 mg via RESPIRATORY_TRACT
  Filled 2020-09-27: qty 3

## 2020-09-27 MED ORDER — HYDRALAZINE HCL 20 MG/ML IJ SOLN: 10.0000 mg | Freq: Four times a day (QID) | INTRAMUSCULAR | Status: DC | PRN

## 2020-09-27 NOTE — H&P (Signed)
[facility] History and Physical  Christian Burns XJO:[id number] DOB: April 12, 1930 DOA: 09/27/2020 PCP: Leanna Battles, MD  Admitted from: Home Chief Complaint: Aspiration event  History of Present Illness: Christian Burns is a 87 y.o. 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 Patient was seen at GI Dr. Vivia Ewing office on 2/9 for dysphagia.  He complained of food getting stuck in his throat, having hard time swallowing and taking long time to eat.  He had weight loss as well.  He was ordered for an esophagogram to rule out esophageal issues.  Patient presented as an outpatient to radiology today to get it done.  During the procedure, he aspirated and started coughing.  His oxygen level dropped and hence he was sent to the ED.  In the ED, he was afebrile, blood pressure elevated 160/93, oxygen saturation was constantly over 95% but he was coughing frequently. Labs showed BUN/creatinine elevated to 43/1.49, WBC count low at 2.5, hemoglobin low at 8.4, platelet low at 107.   Respiratory virus panel including COVID-19 PCR negative. Chest x-ray showed a tiny amount of aspirated barium noted over the left lung base and possibly right mid lung.  Patient ambulated in the ED and his oxygen saturation dropped constantly down to 80%.  He felt fatigued but did not feel short of breath. Hospitalist service was consulted for overnight observation.  At the time of my evaluation, patient was propped up in bed.  Not in distress.  Not on supplemental oxygen.  I got his grandson Christian Burns on the phone. Patient lives with his wife at home and basically takes care of her.  There is an apprehension that his oxygen level would drop at home and he will be able  to help his wife and hence he agreed to overnight observation.  Review of Systems:  All systems were reviewed and were negative unless otherwise mentioned in the HPI   Past medical history: Past Medical History:  Diagnosis Date  . Anemia   . Blood transfusion without reported diagnosis   . CKD (chronic kidney disease) 07/06/2018   CKD stage III  . Goals of care, counseling/discussion 02/04/2019  . History of radiation therapy 06/17/13-07/06/13   35 gray to upper lumbar spine  . 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 Hypertension   . Inguinal hernia   . Multiple myeloma [facility]) Sept 2010  . Multiple myeloma in relapse ([place]) 04/05/2016  . Radiation 09/26/15-10/20/15   lower thoracic spine, upper lumbar spine 30 gray  . Stroke [facility])     Past surgical history: Past Surgical History:  Procedure Laterality Date  . COLONOSCOPY    . HIP PINNING,CANNULATED Right 09/01/2018   Procedure: [facility] NAIL HIP PINNING;  Surgeon: Melrose Nakayama, MD;  Location: [facility];  Service: Orthopedics;  Laterality: Right;  . IR IMAGING GUIDED PORT INSERTION  11/01/2019  . LOOP RECORDER INSERTION N/A 04/21/2019   Procedure: LOOP RECORDER INSERTION;  Surgeon: Evans Lance, MD;  Location: [facility];  Service: Cardiovascular;  Laterality: N/A;  . TEE WITHOUT CARDIOVERSION N/A 10/09/2018   Procedure: TRANSESOPHAGEAL ECHOCARDIOGRAM (TEE);  Surgeon: Acie Fredrickson Wonda Cheng, MD;  Location: [facility] ENDOSCOPY;  Service: Cardiovascular;  Laterality: N/A;  . TONSILLECTOMY      Social History:  reports that he has never smoked.  He has never used smokeless tobacco. He reports previous alcohol use of about 4.0 standard drinks of alcohol per week. He reports that he does not use drugs.  Allergies:  Allergies  Allergen Reactions  . Sulfa Antibiotics Itching  . Sulfasalazine Itching    Family history:  Family History  Problem Relation Age of Onset  . Hypertension Mother   . Hypertension Father   . Colon cancer  Neg Hx   . Esophageal cancer Neg Hx   . Cancer Neg Hx   . Rectal cancer Neg Hx   . Stomach cancer Neg Hx      Home Meds: Prior to Admission medications   Medication Sig Start Date End Date Taking? Authorizing Provider  amLODipine-benazepril (LOTREL) 5-10 MG capsule TAKE ONE CAPSULE EACH DAY 07/17/20   [name], MD  aspirin EC 81 MG tablet Take 1 tablet (81 mg total) by mouth daily. Swallow whole. 03/15/20   Frann Rider, NP  dronabinol (MARINOL) 5 MG capsule TAKE ONE CAPSULE TWICE A DAY BEFORE A MEAL 05/24/20   [name], MD  DULoxetine (CYMBALTA) 60 MG capsule TAKE ONE CAPSULE EACH DAY 08/07/20   [name], MD  finasteride (PROSCAR) 5 MG tablet TAKE ONE TABLET EACH DAY Patient taking differently: Take 5 mg by mouth daily. 04/22/19   [name], MD  lidocaine-prilocaine (EMLA) cream Apply 1 application topically as needed. 11/05/19   [name], MD  metoprolol succinate (TOPROL-XL) 25 MG 24 hr tablet Take 25 mg by mouth daily. 02/25/20   [provider]  Multiple Vitamins-Minerals (QC MULTI-VITE 50 & OVER PO) TAKE ONE TABLET DAILY 09/11/19   [provider]  ondansetron (ZOFRAN) 4 MG tablet Take 1 tablet (4 mg total) by mouth every 6 (six) hours as needed for nausea. 10/04/19   Oswald Hillock, MD  pantoprazole (PROTONIX) 40 MG tablet Take 1 tablet (40 mg total) by mouth 2 (two) times daily. 12/06/19   [name], MD  penicillin v potassium (VEETID) 500 MG tablet TAKE ONE TABLET FOUR TIMES DAILY 08/10/20   Carlyle Basques, MD  polyvinyl alcohol (LIQUIFILM TEARS) 1.4 % ophthalmic solution Place 1 drop into both eyes as needed for dry eyes. 03/05/19   Swayze, Ava, DO  predniSONE (DELTASONE) 20 MG tablet TAKE ONE TABLET EACH DAY WITH BREAKFAST 08/07/20   [name], MD  Probiotic Product (PROBIOTIC PO) Take 1 capsule by mouth daily.    [provider]  pyridOXINE (VITAMIN B-6) 100 MG tablet Take 100 mg by mouth daily.  09/17/13    [name], MD  traMADol (ULTRAM) 50 MG tablet Take 1 tablet (50 mg total) by mouth every 8 (eight) hours as needed for moderate pain. 10/04/19   Oswald Hillock, MD  vitamin B-12 (CYANOCOBALAMIN) 500 MCG tablet Take 500 mcg by mouth daily.    [provider]  Vitamin D, Ergocalciferol, (DRISDOL) 50000 UNITS CAPS capsule Take 50,000 Units by mouth every Sunday.     [provider]  White Petrolatum-Mineral Oil (EYE LUBRICANT) OINT Apply 1 application to eye in the morning and at bedtime. If left eye lower eye lid is irritated 06/07/20   Carlyle Basques, MD    Physical Exam: Vitals:   09/27/20 1615 09/27/20 1630 09/27/20 1700 09/27/20 1730  BP:  (!) 159/83 (!) 162/90 (!) 158/81  Pulse: 71 71 75 79  Resp: (!) _0 Temp:      TempSrc:  SpO2: 100% 97% 98% 97%  Weight:      Height:       Wt Readings from Last 3 Encounters:  09/27/20 56.7 kg  09/20/20 58.6 kg  08/30/20 58 kg   Body mass index is 18.46 kg/m.  General exam: Pleasant elderly Caucasian male.  Propped up in bed.  Not in distress. Skin: No rashes, lesions or ulcers. HEENT: Atraumatic, normocephalic, no obvious bleeding.  Has left eye subconjunctival hemorrhage Lungs: Clear to auscultation bilaterally CVS: Regular rate and rhythm, no murmur GI/Abd soft, nontender, nondistended, bowel sound present CNS: Alert, awake, oriented x3 Psychiatry: Mood appropriate Extremities: No pedal edema, no calf tenderness     Consult Orders  (From admission, onward)         Start     Ordered   09/27/20 1704  Consult to hospitalist  ALL PATIENTS BEING ADMITTED/HAVING PROCEDURES NEED COVID-19 SCREENING  Once       Comments: ALL PATIENTS BEING ADMITTED/HAVING PROCEDURES NEED COVID-19 SCREENING  Provider:  (Not yet assigned)  Question Answer Comment  Place call to: [facility]   Reason for Consult Admit      09/27/20 1703   Unscheduled  SLP eval and treat Reason for evaluation: .Swallowing  evaluation (BSE, MBS and/or diet order as indicated), Modified Barium Swallow Study and diet order as indicated  Once       Question Answer Comment  Reason for evaluation .Swallowing evaluation (BSE, MBS and/or diet order as indicated)   Reason for evaluation Modified Barium Swallow Study and diet order as indicated      09/27/20 1808          Labs on Admission:   CBC: Recent Labs  Lab 09/27/20 1533  WBC 2.5*  NEUTROABS 1.8  HGB 8.4*  HCT 26.3*  MCV 95.3  PLT 107*    Basic Metabolic Panel: Recent Labs  Lab 09/27/20 1533  NA 139  K 4.2  CL 108  CO2 23  GLUCOSE 127*  BUN 43*  CREATININE 1.49*  CALCIUM 8.5*    Liver Function Tests: No results for input(s): AST, ALT, ALKPHOS, BILITOT, PROT, ALBUMIN in the last 168 hours. No results for input(s): LIPASE, AMYLASE in the last 168 hours. No results for input(s): AMMONIA in the last 168 hours.  Cardiac Enzymes: No results for input(s): CKTOTAL, CKMB, CKMBINDEX, TROPONINI in the last 168 hours.  BNP (last 3 results) No results for input(s): BNP in the last 8760 hours.  ProBNP (last 3 results) No results for input(s): PROBNP in the last 8760 hours.  CBG: No results for input(s): GLUCAP in the last 168 hours.  Lipase     Component Value Date/Time   LIPASE 15 02/03/2017 2153     Urinalysis    Component Value Date/Time   COLORURINE YELLOW 06/22/2019 1742   APPEARANCEUR CLEAR 06/22/2019 1742   LABSPEC 1.013 06/22/2019 1742   LABSPEC 1.020 09/17/2013 1036   PHURINE 5.0 06/22/2019 1742   GLUCOSEU NEGATIVE 06/22/2019 1742   HGBUR SMALL (A) 06/22/2019 1742   BILIRUBINUR NEGATIVE 06/22/2019 1742   KETONESUR NEGATIVE 06/22/2019 1742   PROTEINUR 100 (A) 06/22/2019 1742   UROBILINOGEN 0.2 09/17/2013 1036   NITRITE NEGATIVE 06/22/2019 1742   LEUKOCYTESUR NEGATIVE 06/22/2019 1742     Drugs of Abuse  No results found for: LABOPIA, COCAINSCRNUR, LABBENZ, AMPHETMU, THCU, LABBARB    Radiological Exams on  Admission: DG Chest 2 View  Result Date: 09/27/2020 CLINICAL DATA:  Question aspiration. EXAM: CHEST - 2 VIEW  COMPARISON:  Barium swallow 09/27/2020.  Chest x-ray 09/30/2019. FINDINGS: PowerPort catheter noted with tip over SVC. Cardiac monitor device noted over the left chest. Heart size normal. Tiny amount of aspirated barium noted over the left lung base and possibly right mid lung. Chronic interstitial changes noted. Tiny left pleural effusion versus pleural scarring. No pneumothorax. IMPRESSION: 1. PowerPort catheter noted with tip over the SVC. 2. Tiny amount of aspirated barium noted over the left lung base and possibly right mid lung. Chronic interstitial changes noted. Electronically Signed   By: Marcello Moores  Register   On: 09/27/2020 13:54   DG ESOPHAGUS W DOUBLE CM (HD)  Result Date: 09/27/2020 CLINICAL DATA:  Dysphagia. Additional history provided: Patient reports difficulty swallowing food and difficulty with food "coming back up." EXAM: ESOPHOGRAM / BARIUM SWALLOW / BARIUM TABLET STUDY TECHNIQUE: Combined double contrast and single contrast examination performed using effervescent crystals, thick barium liquid, and thin barium liquid. The patient was observed with fluoroscopy swallowing a 13 mm barium sulphate tablet. FLUOROSCOPY TIME:  Fluoroscopy Time:  2.2 minutes Radiation Exposure Index (if provided by the fluoroscopic device): 21.30 mGy Number of Acquired Spot Images: None COMPARISON:  Modified barium swallow 12/14/2019 FINDINGS: The patient had difficulty swallowing contrast at a rate to fully distend the esophagus. Within this limitation, no high-grade fixed esophageal stricture was identified. Moderate intermittent esophageal dysmotility. Small sliding hiatal hernia. Following small-volume aspiration of thick barium, the examination was prematurely terminated. A swallowed 13 mm barium tablet was mildly delayed at the level of the aortic arch with subsequent free passage into the stomach. The  ordering provider, PA Alonza Bogus, was contacted at the conclusion of the examination and notified that the patient had aspirated with persistent coughing. It was decided that the patient should be further evaluated either by his primary care physician or in the emergency department. The patient and his grandson elected to be seen in the emergency department and were transferred to the ED following the examination at 11:45 a.m. on 09/27/2020. IMPRESSION: Prematurely terminated examination due to small-volume aspiration of thick barium. A modified barium swallow with speech pathology is recommended for further evaluation. Prior to exam termination, the patient had difficulty swallowing contrast at a rate to fully distend the esophagus. Within this limitation, no high-grade fixed esophageal stricture is identified. A swallowed 13 mm barium tablet was mildly delayed at the level of the aortic arch with subsequent free passage into the stomach Moderate intermittent esophageal dysmotility. Small sliding hiatal hernia. Electronically Signed   By: Kellie Simmering DO   On: 09/27/2020 11:52     ------------------------------------------------------------------------------------------------------ Assessment/Plan: Active Problems:   Dysphagia  Aspiration pneumonitis Hypoxia on ambulation -Patient had an event of aspiration during the barium swallow study after which he had frequent cough. -No fever.  No leukocytosis.  I would avoid antibiotics at this time and keep him observed. -Not requiring supplemental oxygen at rest but oxygen saturation drops down to 80s on ambulation.  Dysphagia -Per GI note as an outpatient, patient seems to have ongoing issue with oropharyngeal dysphagia.  He had an EGD last year and treatment of his candidiasis. -Failed esophagogram today. -Obtain speech therapy evaluation for possible modified barium swallow tomorrow. -N.p.o. after midnight just in case any procedure required -I  will order for dysphagia 1 diet for tonight  Essential hypertension -Home meds include Toprol 25 mg daily, amlodipine 5 mg daily, benazepril 10 mg daily. -Resume metoprolol.  Keep others on hold.  Continue to monitor blood pressure  History  of CVA -Continue aspirin.  Not on a statin  CKD stage IIIb -Creatinine remains at baseline Recent Labs    01/12/20 1153 02/09/20 1043 03/08/20 1140 04/06/20 1228 05/03/20 0858 06/08/20 0935 07/05/20 0902 08/02/20 1130 08/30/20 0855 09/27/20 1533  BUN 42* 41* 43* 55* 44* 61* 44* 57* 54* 43*  CREATININE 1.76* 1.57* 1.82* 1.56* 1.57* 1.85* 1.60* 1.64* 1.77* 1.49*   Multiple myeloma with recurrence -currently on bendamustine, IVIG and prednisone 20 mg daily  Chronic osteomyelitis of jaw -Chronically on penicillin 5 mg 4 times daily.  Pancytopenia -Secondary to malignancy and therapy -Monitor daily CBC  Chronic iron deficiency anemia -Hemoglobin at baseline. Recent Labs    02/09/20 1045 03/08/20 1140 03/08/20 1141 04/06/20 1228 04/06/20 1229 05/03/20 0858 05/03/20 0859 06/08/20 0935 07/05/20 0902 08/02/20 1130 08/30/20 0855 08/30/20 0856 09/27/20 1533  HGB  --    < >  --    < >  --    < >  --  9.6* 8.6* 9.5* 10.2*  --  8.4*  MCV  --    < >  --    < >  --    < >  --  92.7 91.1 93.8 93.4  --  95.3  FERRITIN 366*  --  455*  --  501*  --  741*  --  807* 881* 730*  --   --   TIBC 223  --  209  --  225  --   --   --  223 233 235  --   --   IRON 84  --  48  --  58  --   --   --  90 114 108  --   --   RETICCTPCT 1.3  --  0.7  --  1.1  --   --   --   --  1.1  --  0.7  --    < > = values in this interval not displayed.   history of upper GI bleeding (09/2019)  -secondary to bleeding duodenal ulcer, esophageal candidiasis. -Continue PPI twice daily.  BPH -Continue Proscar  Mobility: Independent Code Status:   Code Status: Full Code  DVT prophylaxis:  Lovenox subcu Antimicrobials:  Chronic suppression with penicillin Fluid:  None  Diet:  Diet Order            DIET - DYS 1 Room service appropriate? Yes; Fluid consistency: Thin  Diet effective now                 Consultants: None Family Communication:  Discussed with patient's grandson Christian Burns on the phone   Dispo: The patient is from: Home              Anticipated d/c is to: Home              Anticipated d/c date is: 1 day  ------------------------------------------------------------------------------------- Severity of Illness: The appropriate patient status for this patient is OBSERVATION. Observation status is judged to be reasonable and necessary in order to provide the required intensity of service to ensure the patient's safety. The patient's presenting symptoms, physical exam findings, and initial radiographic and laboratory data in the context of their medical condition is felt to place them at decreased risk for further clinical deterioration. Furthermore, it is anticipated that the patient will be medically stable for discharge from the hospital within 2 midnights of admission. The following factors support the patient status of observation.   " The patient's presenting symptoms  include aspiration, persistent cough. " The physical exam findings include hypoxia on ambulation " The initial radiographic and laboratory data are aspiration.  Signed, Terrilee Croak, MD [facility] 09/27/2020

## 2020-09-27 NOTE — ED Triage Notes (Signed)
Pt presents with c/o possible aspiration. Pt was in the middle of a barium swallow and possibly aspirated some of the liquid, causing him to cough for quite some time after the study.

## 2020-09-27 NOTE — ED Notes (Signed)
Ambulated pt on portable pulse ox approximately 40 ft. SpO2 dropped slowly but consistently down to 80%. Pt denied SHOB, dizziness.

## 2020-09-27 NOTE — Telephone Encounter (Signed)
Patients grandson Christian Burns called to let us know his dad wont be at his appt tomorrow.  He aspirated during a swallowing study and is currently being evaluated at ER for pneumnia. Appts taken out.  Patient to call us when he can to reschedule.

## 2020-09-27 NOTE — TOC Initial Note (Signed)
Transition of Care [facility]) - Initial/Assessment Note    Patient Details  Name: Christian Burns MRN: [id number] Date of Birth: 12-Aug-1930  [facility]) CM/SW Contact:    Joanne Chars, LCSW Phone Number: 09/27/2020, 7:00 PM  Clinical Narrative:  CSW met with pt to discuss recommendation for home O2.  Pt lives with wife in independent living at [place],  [place] given to speak to wife Romie Minus and grandson [name].  Pt willing to use O2 as recommended by MD and reports his wife recently had home O2 and they still have some of the equipment at home, he does not recall which company provided it, would like to use that same company.  He also reports that [facility] has PT/OT services on site if needed and that he has used these services before as well.  Pt is vaccinated for covid and boosted.  Current equipment in home: walker, shower chair.     CSW attempted to call wife to identify DME company but there was no answer.                Expected Discharge Plan: Home/Self Care Barriers to Discharge: Continued Medical Work up   Patient Goals and CMS Choice Patient states their goals for this hospitalization and ongoing recovery are:: "just stay alive"      Expected Discharge Plan and Services Expected Discharge Plan: Home/Self Care In-house Referral: Clinical Social Work   Post Acute Care Choice: Durable Medical Equipment Living arrangements for the past 2 months: [place] ([facility])                                      Prior Living Arrangements/Services Living arrangements for the past 2 months: [place] ([facility]) Lives with:: Spouse Patient language and need for interpreter reviewed:: Yes Do you feel safe going back to the place where you live?: Yes      Need for Family Participation in Patient Care: Yes (Comment) Care giver support system in place?: Yes (comment) Current home services: Other (comment)  (none) Criminal Activity/Legal Involvement Pertinent to Current Situation/Hospitalization: No - Comment as needed  Activities of Daily Living Home Assistive Devices/Equipment: Hearing aid,Walker (specify type),Eyeglasses (right hearing aide, reading glasses) ADL Screening (condition at time of admission) Patient's cognitive ability adequate to safely complete daily activities?: Yes Is the patient deaf or have difficulty hearing?: Yes Does the patient have difficulty seeing, even when wearing glasses/contacts?: No (left eye bruised and reddened - pt states he can see ok and this happened last week) Does the patient have difficulty concentrating, remembering, or making decisions?: No Patient able to express need for assistance with ADLs?: Yes Does the patient have difficulty dressing or bathing?: No Independently performs ADLs?: Yes (appropriate for developmental age) (pt uses a walker) Does the patient have difficulty walking or climbing stairs?: Yes Weakness of Legs: None Weakness of Arms/Hands: None  Permission Sought/Granted Permission sought to share information with : Family Supports Permission granted to share information with : Yes, Verbal Permission Granted  Share Information with NAME: wife Lanny Hurst           Emotional Assessment Appearance:: Appears stated age Attitude/Demeanor/Rapport: Engaged Affect (typically observed): Appropriate,Pleasant Orientation: : Oriented to Self,Oriented to Place,Oriented to  Time,Oriented to Situation Alcohol / Substance Use: Not Applicable Psych Involvement: No (comment)  Admission diagnosis:  Dysphagia [R13.10] Patient Active Problem List  Diagnosis Date Noted  . Dysphagia 09/20/2020  . SIRS (systemic inflammatory response syndrome) ([place]) 09/28/2019  . Multifocal pneumonia 09/28/2019  . Anemia associated with chemotherapy 09/28/2019  . Protein-calorie malnutrition, severe 06/24/2019  . Leukopenia due to antineoplastic  chemotherapy ([place]) 06/23/2019  . Nausea 06/23/2019  . Diarrhea 06/23/2019  . CKD (chronic kidney disease), stage III ([place]) 06/23/2019  . HCAP (healthcare-associated pneumonia) 06/22/2019  . Hyperlipidemia LDL goal <70 04/20/2019  . Essential hypertension 04/20/2019  . Advanced age 85/03/2019  . Stroke ([place]) 04/19/2019  . Community acquired pneumonia 03/03/2019  . AKI (acute kidney injury) ([place]) 03/03/2019  . Normochromic normocytic anemia 03/03/2019  . Goals of care, counseling/discussion 02/04/2019  . Acute osteomyelitis of jaw 11/26/2018  . Bacteremia   . 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 Sepsis due to undetermined organism ([place])   . Left lower lobe pneumonia 10/06/2018  . Right hip pain 09/01/2018  . Iron deficiency anemia secondary to inadequate dietary iron intake 07/18/2016  . Multiple myeloma in relapse ([place]) 04/05/2016  . 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 Multiple myeloma in remission ([place]) 09/08/2015  . Myeloma ([place]) 08/23/2011   PCP:  Leanna Battles, MD Pharmacy:   [facility] - [place], [address] [place] [address] Phone: [phone] Fax: [phone] [place], [place] - [address] [place] [address] Phone: [phone] Fax: [phone]     Social Determinants of Health (SDOH) Interventions    Readmission Risk Interventions Readmission Risk Prevention Plan 10/03/2019  Transportation Screening Complete  HRI or Home Care Consult Complete  SW Recovery Care/Counseling Consult Complete  Skilled Nursing Facility Complete  Some recent data might be hidden

## 2020-09-27 NOTE — ED Provider Notes (Signed)
[place] DEPT Provider Note   CSN: [id number] Arrival date & time: 09/27/20  1141     History Chief Complaint  Patient presents with  . Cough    Christian Burns is a 87 y.o. male.  87 yo M with a chief complaints of a cough after having a barium swallow performed.  Patient actually had the witnessed aspiration events during the imaging studies.  Had persistent coughing and decided brought him to the ED.  Son says over the past hour or so he has been doing much better.  Has been resting comfortably.  No difficulty breathing.  No fevers.  Patient has had some mild increase of congestion over the past couple days.  Also had a subconjunctival hematoma that was seen by the ophthalmologist that occurred about a week ago.  The history is provided by the patient and a relative.  Cough Associated symptoms: shortness of breath   Associated symptoms: no chest pain, no chills, no eye discharge, no fever, no headaches, no myalgias and no rash   Illness Severity:  Moderate Onset quality:  Gradual Duration:  2 days Timing:  Constant Progression:  Worsening Chronicity:  New Associated symptoms: congestion, cough and shortness of breath   Associated symptoms: no abdominal pain, no chest pain, no diarrhea, no fever, no headaches, no myalgias, no rash and no vomiting        Past Medical History:  Diagnosis Date  . Anemia   . Blood transfusion without reported diagnosis   . CKD (chronic kidney disease) 07/06/2018   CKD stage III  . Goals of care, counseling/discussion 02/04/2019  . History of radiation therapy 06/17/13-07/06/13   35 gray to upper lumbar spine  . 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 Hypertension   . Inguinal hernia   . Multiple myeloma [facility]) Sept 2010  . Multiple myeloma in relapse ([place]) 04/05/2016  . Radiation 09/26/15-10/20/15   lower thoracic spine, upper lumbar spine 30 gray  . Stroke [facility])     Patient Active Problem List    Diagnosis Date Noted  . Dysphagia 09/20/2020  . SIRS (systemic inflammatory response syndrome) ([place]) 09/28/2019  . Multifocal pneumonia 09/28/2019  . Anemia associated with chemotherapy 09/28/2019  . Protein-calorie malnutrition, severe 06/24/2019  . Leukopenia due to antineoplastic chemotherapy ([place]) 06/23/2019  . Nausea 06/23/2019  . Diarrhea 06/23/2019  . CKD (chronic kidney disease), stage III ([place]) 06/23/2019  . HCAP (healthcare-associated pneumonia) 06/22/2019  . Hyperlipidemia LDL goal <70 04/20/2019  . Essential hypertension 04/20/2019  . Advanced age 101/03/2019  . Stroke ([place]) 04/19/2019  . Community acquired pneumonia 03/03/2019  . AKI (acute kidney injury) ([place]) 03/03/2019  . Normochromic normocytic anemia 03/03/2019  . Goals of care, counseling/discussion 02/04/2019  . Acute osteomyelitis of jaw 11/26/2018  . Bacteremia   . 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 Sepsis due to undetermined organism ([place])   . Left lower lobe pneumonia 10/06/2018  . Right hip pain 09/01/2018  . Iron deficiency anemia secondary to inadequate dietary iron intake 07/18/2016  . Multiple myeloma in relapse ([place]) 04/05/2016  . 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 Multiple myeloma in remission ([place]) 09/08/2015  . Myeloma ([place]) 08/23/2011    Past Surgical History:  Procedure Laterality Date  . COLONOSCOPY    . HIP PINNING,CANNULATED Right 09/01/2018   Procedure: [facility] NAIL HIP PINNING;  Surgeon: Melrose Nakayama, MD;  Location: [place];  Service: Orthopedics;  Laterality: Right;  . IR IMAGING GUIDED  PORT INSERTION  11/01/2019  . LOOP RECORDER INSERTION N/A 04/21/2019   Procedure: LOOP RECORDER INSERTION;  Surgeon: Evans Lance, MD;  Location: [place] CV LAB;  Service: Cardiovascular;  Laterality: N/A;  . TEE WITHOUT CARDIOVERSION N/A 10/09/2018   Procedure: TRANSESOPHAGEAL ECHOCARDIOGRAM (TEE);  Surgeon: Acie Fredrickson Wonda Cheng, MD;  Location: [facility] ENDOSCOPY;  Service:  Cardiovascular;  Laterality: N/A;  . TONSILLECTOMY         Family History  Problem Relation Age of Onset  . Hypertension Mother   . Hypertension Father   . Colon cancer Neg Hx   . Esophageal cancer Neg Hx   . Cancer Neg Hx   . Rectal cancer Neg Hx   . Stomach cancer Neg Hx     Social History   Tobacco Use  . Smoking status: Never Smoker  . Smokeless tobacco: Never Used  . Tobacco comment: never used tobacco  Vaping Use  . Vaping Use: Never used  Substance Use Topics  . Alcohol use: Not Currently    Alcohol/week: 4.0 standard drinks    Types: 4 Glasses of wine per week  . Drug use: No    Home Medications Prior to Admission medications   Medication Sig Start Date End Date Taking? Authorizing Provider  amLODipine-benazepril (LOTREL) 5-10 MG capsule TAKE ONE CAPSULE EACH DAY Patient taking differently: Take 1 capsule by mouth daily. 07/17/20  Yes [name], MD  aspirin EC 81 MG tablet Take 1 tablet (81 mg total) by mouth daily. Swallow whole. 03/15/20  Yes Frann Rider, NP  dronabinol (MARINOL) 5 MG capsule TAKE ONE CAPSULE TWICE A DAY BEFORE A MEAL 05/24/20  Yes [name], MD  DULoxetine (CYMBALTA) 60 MG capsule TAKE ONE CAPSULE EACH DAY 08/07/20  Yes [name], MD  finasteride (PROSCAR) 5 MG tablet TAKE ONE TABLET EACH DAY Patient taking differently: Take 5 mg by mouth daily. 04/22/19  Yes Ennever, Rudell Cobb, MD  loperamide (IMODIUM A-D) 2 MG tablet Take 2 mg by mouth daily.   Yes [provider]  metoprolol succinate (TOPROL-XL) 25 MG 24 hr tablet Take 25 mg by mouth daily. 02/25/20  Yes [provider]  Multiple Vitamins-Minerals (QC MULTI-VITE 50 & OVER PO) TAKE ONE TABLET DAILY 09/11/19  Yes [provider]  pantoprazole (PROTONIX) 40 MG tablet Take 1 tablet (40 mg total) by mouth 2 (two) times daily. Patient taking differently: Take 40 mg by mouth daily. 12/06/19  Yes [name], MD  penicillin v potassium (VEETID) 500  MG tablet TAKE ONE TABLET FOUR TIMES DAILY 08/10/20  Yes Carlyle Basques, MD  predniSONE (DELTASONE) 20 MG tablet TAKE ONE TABLET EACH DAY WITH BREAKFAST 08/07/20  Yes [name], MD  Probiotic Product (PROBIOTIC PO) Take 1 capsule by mouth daily.   Yes [provider]  pyridOXINE (VITAMIN B-6) 100 MG tablet Take 100 mg by mouth daily.  09/17/13  Yes [name], MD  vitamin B-12 (CYANOCOBALAMIN) 500 MCG tablet Take 500 mcg by mouth daily.   Yes [provider]  Vitamin D, Ergocalciferol, (DRISDOL) 50000 UNITS CAPS capsule Take 50,000 Units by mouth every Sunday.    Yes [provider]  lidocaine-prilocaine (EMLA) cream Apply 1 application topically as needed. Patient taking differently: Apply 1 application topically daily as needed (access port). 11/05/19   [name], MD  ondansetron (ZOFRAN) 4 MG tablet Take 1 tablet (4 mg total) by mouth every 6 (six) hours as needed for nausea. 10/04/19  Oswald Hillock, MD  polyvinyl alcohol (LIQUIFILM TEARS) 1.4 % ophthalmic solution Place 1 drop into both eyes as needed for dry eyes. 03/05/19   Swayze, Ava, DO  traMADol (ULTRAM) 50 MG tablet Take 1 tablet (50 mg total) by mouth every 8 (eight) hours as needed for moderate pain. 10/04/19   Oswald Hillock, MD  White Petrolatum-Mineral Oil (EYE LUBRICANT) OINT Apply 1 application to eye in the morning and at bedtime. If left eye lower eye lid is irritated 06/07/20   Carlyle Basques, MD    Allergies    Sulfa antibiotics and Sulfasalazine  Review of Systems   Review of Systems  Constitutional: Negative for chills and fever.  HENT: Positive for congestion. Negative for facial swelling.   Eyes: Negative for discharge and visual disturbance.  Respiratory: Positive for cough and shortness of breath.   Cardiovascular: Negative for chest pain and palpitations.  Gastrointestinal: Negative for abdominal pain, diarrhea and vomiting.  Musculoskeletal: Negative for arthralgias and  myalgias.  Skin: Negative for color change and rash.  Neurological: Negative for tremors, syncope and headaches.  Psychiatric/Behavioral: Negative for confusion and dysphoric mood.    Physical Exam Updated Vital Signs BP (!) 153/88   Pulse 82   Temp 97.8 F (36.6 C) (Oral)   Resp (!) 23   Ht 5' 9"  (1.753 m)   Wt 56.7 kg   SpO2 100%   BMI 18.46 kg/m   Physical Exam Vitals and nursing note reviewed.  Constitutional:      Appearance: He is well-developed and well-nourished.  HENT:     Head: Normocephalic and atraumatic.  Eyes:     Extraocular Movements: EOM normal.     Pupils: Pupils are equal, round, and reactive to light.  Neck:     Vascular: No JVD.  Cardiovascular:     Rate and Rhythm: Normal rate and regular rhythm.     Heart sounds: No murmur heard. No friction rub. No gallop.   Pulmonary:     Effort: No respiratory distress.     Breath sounds: No wheezing.  Abdominal:     General: There is no distension.     Tenderness: There is no abdominal tenderness. There is no guarding or rebound.  Musculoskeletal:        General: Normal range of motion.     Cervical back: Normal range of motion and neck supple.  Skin:    Coloration: Skin is not pale.     Findings: No rash.  Neurological:     Mental Status: He is alert and oriented to person, place, and time.  Psychiatric:        Mood and Affect: Mood and affect normal.        Behavior: Behavior normal.     ED Results / Procedures / Treatments   Labs (all labs ordered are listed, but only abnormal results are displayed) Labs Reviewed  CBC WITH DIFFERENTIAL/PLATELET - Abnormal; Notable for the following components:      Result Value   WBC 2.5 (*)    RBC 2.76 (*)    Hemoglobin 8.4 (*)    HCT 26.3 (*)    RDW 16.7 (*)    Platelets 107 (*)    Lymphs Abs 0.1 (*)    Abs Immature Granulocytes 0.18 (*)    All other components within normal limits  BASIC METABOLIC PANEL - Abnormal; Notable for the following  components:   Glucose, Bld 127 (*)    BUN 43 (*)  Creatinine, Ser 1.49 (*)    Calcium 8.5 (*)    GFR, Estimated 45 (*)    All other components within normal limits  RESP PANEL BY RT-PCR (FLU A&B, COVID) [id number]  BASIC METABOLIC PANEL  CBC    EKG None  Radiology DG Chest 2 View  Result Date: 09/27/2020 CLINICAL DATA:  Question aspiration. EXAM: CHEST - 2 VIEW COMPARISON:  Barium swallow 09/27/2020.  Chest x-ray 09/30/2019. FINDINGS: PowerPort catheter noted with tip over SVC. Cardiac monitor device noted over the left chest. Heart size normal. Tiny amount of aspirated barium noted over the left lung base and possibly right mid lung. Chronic interstitial changes noted. Tiny left pleural effusion versus pleural scarring. No pneumothorax. IMPRESSION: 1. PowerPort catheter noted with tip over the SVC. 2. Tiny amount of aspirated barium noted over the left lung base and possibly right mid lung. Chronic interstitial changes noted. Electronically Signed   By: Marcello Moores  Register   On: 09/27/2020 13:54   DG ESOPHAGUS W DOUBLE CM (HD)  Result Date: 09/27/2020 CLINICAL DATA:  Dysphagia. Additional history provided: Patient reports difficulty swallowing food and difficulty with food "coming back up." EXAM: ESOPHOGRAM / BARIUM SWALLOW / BARIUM TABLET STUDY TECHNIQUE: Combined double contrast and single contrast examination performed using effervescent crystals, thick barium liquid, and thin barium liquid. The patient was observed with fluoroscopy swallowing a 13 mm barium sulphate tablet. FLUOROSCOPY TIME:  Fluoroscopy Time:  2.2 minutes Radiation Exposure Index (if provided by the fluoroscopic device): 21.30 mGy Number of Acquired Spot Images: None COMPARISON:  Modified barium swallow 12/14/2019 FINDINGS: The patient had difficulty swallowing contrast at a rate to fully distend the esophagus. Within this limitation, no high-grade fixed esophageal stricture was identified. Moderate intermittent esophageal  dysmotility. Small sliding hiatal hernia. Following small-volume aspiration of thick barium, the examination was prematurely terminated. A swallowed 13 mm barium tablet was mildly delayed at the level of the aortic arch with subsequent free passage into the stomach. The ordering provider, PA Alonza Bogus, was contacted at the conclusion of the examination and notified that the patient had aspirated with persistent coughing. It was decided that the patient should be further evaluated either by his primary care physician or in the emergency department. The patient and his grandson elected to be seen in the emergency department and were transferred to the ED following the examination at 11:45 a.m. on 09/27/2020. IMPRESSION: Prematurely terminated examination due to small-volume aspiration of thick barium. A modified barium swallow with speech pathology is recommended for further evaluation. Prior to exam termination, the patient had difficulty swallowing contrast at a rate to fully distend the esophagus. Within this limitation, no high-grade fixed esophageal stricture is identified. A swallowed 13 mm barium tablet was mildly delayed at the level of the aortic arch with subsequent free passage into the stomach Moderate intermittent esophageal dysmotility. Small sliding hiatal hernia. Electronically Signed   By: Kellie Simmering DO   On: 09/27/2020 11:52    Procedures Procedures   Medications Ordered in ED Medications  aspirin EC tablet 81 mg (81 mg Oral Given 09/27/20 1819)  traMADol (ULTRAM) tablet 50 mg (has no administration in time range)  penicillin v potassium (VEETID) tablet 500 mg (500 mg Oral Given 09/27/20 1819)  metoprolol succinate (TOPROL-XL) 24 hr tablet 25 mg (25 mg Oral Given 09/27/20 1819)  DULoxetine (CYMBALTA) DR capsule 60 mg (60 mg Oral Given 09/27/20 1819)  pantoprazole (PROTONIX) EC tablet 40 mg (has no administration in time range)  acetaminophen (TYLENOL)  tablet 650 mg (has no  administration in time range)    Or  acetaminophen (TYLENOL) suppository 650 mg (has no administration in time range)  albuterol (PROVENTIL) (2.5 MG/3ML) 0.083% nebulizer solution 2.5 mg (2.5 mg Nebulization Given 09/27/20 1918)  hydrALAZINE (APRESOLINE) injection 10 mg (has no administration in time range)  enoxaparin (LOVENOX) injection 30 mg (has no administration in time range)  senna (SENOKOT) tablet 8.6 mg (has no administration in time range)    ED Course  I have reviewed the triage vital signs and the nursing notes.  Pertinent labs & imaging results that were available during my care of the patient were reviewed by me and considered in my medical decision making (see chart for details).    MDM Rules/Calculators/A&P                          87 yo M who is being evaluated for dysphagia with a barium swallow had an aspiration event during the procedure.  Now feels like he is back to baseline.  No adventitious lung sounds.  Will obtain a two-view chest x-ray.  Ambulate and assess for hypoxia.    Patient unfortunately drops into the 80s with minimal ambulation.  Will obtain basic blood work.  Will discuss with medicine.  The patients results and plan were reviewed and discussed.   Any x-rays performed were independently reviewed by myself.   Differential diagnosis were considered with the presenting HPI.  Medications  aspirin EC tablet 81 mg (81 mg Oral Given 09/27/20 1819)  traMADol (ULTRAM) tablet 50 mg (has no administration in time range)  penicillin v potassium (VEETID) tablet 500 mg (500 mg Oral Given 09/27/20 1819)  metoprolol succinate (TOPROL-XL) 24 hr tablet 25 mg (25 mg Oral Given 09/27/20 1819)  DULoxetine (CYMBALTA) DR capsule 60 mg (60 mg Oral Given 09/27/20 1819)  pantoprazole (PROTONIX) EC tablet 40 mg (has no administration in time range)  acetaminophen (TYLENOL) tablet 650 mg (has no administration in time range)    Or  acetaminophen (TYLENOL) suppository 650 mg  (has no administration in time range)  albuterol (PROVENTIL) (2.5 MG/3ML) 0.083% nebulizer solution 2.5 mg (2.5 mg Nebulization Given 09/27/20 1918)  hydrALAZINE (APRESOLINE) injection 10 mg (has no administration in time range)  enoxaparin (LOVENOX) injection 30 mg (has no administration in time range)  senna (SENOKOT) tablet 8.6 mg (has no administration in time range)    Vitals:   09/27/20 1730 09/27/20 1849 09/27/20 1930 09/27/20 2000  BP: (!) 158/81 (!) 166/89 (!) 156/78 (!) 153/88  Pulse: 79 73 73 82  Resp: 18 18 17  (!) 23  Temp:      TempSrc:      SpO2: 97% 98% 97% 100%  Weight:      Height:        Final diagnoses:  Aspiration into airway, initial encounter    Admission/ observation were discussed with the admitting physician, patient and/or family and they are comfortable with the plan.   Final Clinical Impression(s) / ED Diagnoses Final diagnoses:  Aspiration into airway, initial encounter    Rx / DC Orders ED Discharge Orders    None       Deno Etienne, DO 09/27/20 2032

## 2020-09-28 ENCOUNTER — Observation Stay (HOSPITAL_COMMUNITY): Payer: Medicare HMO

## 2020-09-28 ENCOUNTER — Inpatient Hospital Stay: Payer: Medicare HMO

## 2020-09-28 ENCOUNTER — Inpatient Hospital Stay: Payer: Medicare HMO | Admitting: Hematology & Oncology

## 2020-09-28 DIAGNOSIS — D61818 Other pancytopenia: Secondary | ICD-10-CM | POA: Diagnosis present

## 2020-09-28 DIAGNOSIS — J690 Pneumonitis due to inhalation of food and vomit: Secondary | ICD-10-CM | POA: Diagnosis present

## 2020-09-28 DIAGNOSIS — N1831 Chronic kidney disease, stage 3a: Secondary | ICD-10-CM

## 2020-09-28 DIAGNOSIS — I10 Essential (primary) hypertension: Secondary | ICD-10-CM

## 2020-09-28 DIAGNOSIS — D508 Other iron deficiency anemias: Secondary | ICD-10-CM

## 2020-09-28 DIAGNOSIS — E43 Unspecified severe protein-calorie malnutrition: Secondary | ICD-10-CM

## 2020-09-28 DIAGNOSIS — R1310 Dysphagia, unspecified: Secondary | ICD-10-CM

## 2020-09-28 DIAGNOSIS — C9000 Multiple myeloma not having achieved remission: Secondary | ICD-10-CM

## 2020-09-28 DIAGNOSIS — C9002 Multiple myeloma in relapse: Secondary | ICD-10-CM

## 2020-09-28 LAB — CBC
HCT: 23.7 % — ABNORMAL LOW (ref 39.0–52.0)
Hemoglobin: 7.8 g/dL — ABNORMAL LOW (ref 13.0–17.0)
MCH: 31.1 pg (ref 26.0–34.0)
MCHC: 32.9 g/dL (ref 30.0–36.0)
MCV: 94.4 fL (ref 80.0–100.0)
Platelets: 103 10*3/uL — ABNORMAL LOW (ref 150–400)
RBC: 2.51 MIL/uL — ABNORMAL LOW (ref 4.22–5.81)
RDW: 16.6 % — ABNORMAL HIGH (ref 11.5–15.5)
WBC: 2.2 10*3/uL — ABNORMAL LOW (ref 4.0–10.5)
nRBC: 0 % (ref 0.0–0.2)

## 2020-09-28 LAB — BASIC METABOLIC PANEL
Anion gap: 5 (ref 5–15)
BUN: 40 mg/dL — ABNORMAL HIGH (ref 8–23)
CO2: 27 mmol/L (ref 22–32)
Calcium: 8.3 mg/dL — ABNORMAL LOW (ref 8.9–10.3)
Chloride: 109 mmol/L (ref 98–111)
Creatinine, Ser: 1.62 mg/dL — ABNORMAL HIGH (ref 0.61–1.24)
GFR, Estimated: 41 mL/min — ABNORMAL LOW (ref >60)
Glucose, Bld: 89 mg/dL (ref 70–99)
Potassium: 4.2 mmol/L (ref 3.5–5.1)
Sodium: 141 mmol/L (ref 135–145)

## 2020-09-28 MED ORDER — CHLORHEXIDINE GLUCONATE CLOTH 2 % EX PADS
6.0000 | MEDICATED_PAD | Freq: Every day | CUTANEOUS | Status: DC
  Administered 2020-09-28: 6 via TOPICAL

## 2020-09-28 MED ORDER — NAPHAZOLINE-GLYCERIN 0.012-0.2 % OP SOLN
2.0000 [drp] | Freq: Two times a day (BID) | OPHTHALMIC | Status: DC
  Administered 2020-09-28 – 2020-09-29 (×3): 2 [drp] via OPHTHALMIC
  Filled 2020-09-28: qty 15

## 2020-09-28 MED ORDER — DRONABINOL 5 MG PO CAPS
5.0000 mg | ORAL_CAPSULE | Freq: Two times a day (BID) | ORAL | Status: DC
  Administered 2020-09-29: 5 mg via ORAL
  Filled 2020-09-28: qty 1

## 2020-09-28 MED ORDER — FINASTERIDE 5 MG PO TABS
5.0000 mg | ORAL_TABLET | Freq: Every day | ORAL | Status: DC
  Administered 2020-09-28 – 2020-09-29 (×2): 5 mg via ORAL
  Filled 2020-09-28 (×2): qty 1

## 2020-09-28 MED ORDER — BOOST / RESOURCE BREEZE PO LIQD CUSTOM
1.0000 | Freq: Two times a day (BID) | ORAL | Status: DC
  Administered 2020-09-28: 1 via ORAL

## 2020-09-28 MED ORDER — SODIUM CHLORIDE 0.9% FLUSH: 10.0000 mL | INTRAVENOUS | Status: DC | PRN

## 2020-09-28 MED ORDER — AMLODIPINE BESYLATE 5 MG PO TABS
5.0000 mg | ORAL_TABLET | Freq: Every day | ORAL | Status: DC
  Administered 2020-09-28 – 2020-09-29 (×2): 5 mg via ORAL
  Filled 2020-09-28 (×2): qty 1

## 2020-09-28 MED ORDER — ALBUTEROL SULFATE (2.5 MG/3ML) 0.083% IN NEBU: 2.5000 mg | INHALATION_SOLUTION | Freq: Four times a day (QID) | RESPIRATORY_TRACT | Status: DC | PRN

## 2020-09-28 MED ORDER — ENSURE ENLIVE PO LIQD
237.0000 mL | Freq: Every day | ORAL | Status: DC
  Administered 2020-09-28 – 2020-09-29 (×2): 237 mL via ORAL

## 2020-09-28 NOTE — Consult Note (Signed)
Modified Barium Swallow Progress Note  Patient Details  Name: Christian Burns MRN: [id number] Date of Birth: [birth year]  Today's Date: 09/28/2020  Modified Barium Swallow completed.  Full report located under Chart Review in the Imaging Section.  Brief recommendations include the following:  Clinical Impression Pt presents with mild-moderate pharyngoesophageal dysphagia characterized by penetration into laryngeal vestibule with larger volumes of successive swallows with thin liquids, but no aspiration noted during this study with any consistencies.  Puree/solids/pill consistencies yielded mild-mod vallecular/pyriform residue with clearance to mild-insignificant with repetitive swallows and effortful swallow and/or liquid wash to clear pharynx/pyriform sinuses/vallecular space. Decreased UES opening and Esophageal backflow noted into cervical esophagus intermittently during study with pill/solids/puree.  Esophageal sweep with potential for decreased peristalsis within the distal esophagus per observation.  Pt is at mild-moderate risk for aspiration without utilization of swallowing strategies/aspiration precautions during meals including slow rate, small bites/sips, effortful swallow and repetitive swallows or liquid wash.  Recommend supplements to maintain nutrition via high protein shakes and/or high protein/caloric foods and follow esophageal precautions as well during intake.  Discussed precautions/strategies with pt and grandson (via phone), [name].  Recommend continue soft/thin liquids as tolerated with medication via puree/liquids with small amounts.        Swallow Evaluation Recommendations Soft/thin liquids with swallowing precautions in place as stated above                                       Elvina Sidle, M.S., CCC-SLP 09/28/2020,4:12 PM

## 2020-09-28 NOTE — Evaluation (Signed)
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  During exam, patient's oxygen level noted to drop and hence was sent to the ED.   Clinical Impression   [name] is an 87 year old man who presents with normal ROM and strength of upper extremities, ability to perform bed mobility, ADLs and ambulation in room without a device. Patient has a RW - reporting his ENT wants him to use it to make sure he doesn't fall and hit/break his face and jaw. Patient able to don socks and stand at sink for grooming tasks. No balance deficits with functional tasks. No OT needs at this time.    Follow Up Recommendations  No OT follow up    Equipment Recommendations  None recommended by OT    Recommendations for Other Services       Precautions / Restrictions Precautions Precautions: None Restrictions Weight Bearing Restrictions: No      Mobility Bed Mobility Overal bed mobility: Modified Independent                  Transfers Overall transfer level: Modified independent                    Balance Overall balance assessment: No apparent balance deficits (not formally assessed)                                         ADL either performed or assessed with clinical judgement   ADL Overall ADL's : At baseline                                             Vision Baseline  Vision/History: No visual deficits       Perception     Praxis      Pertinent Vitals/Pain Pain Assessment: No/denies pain     Hand Dominance Left   Extremity/Trunk Assessment Upper Extremity Assessment Upper Extremity Assessment: Overall WFL for tasks assessed   Lower Extremity Assessment Lower Extremity Assessment: Overall WFL for tasks assessed   Cervical / Trunk Assessment Cervical / Trunk Assessment: Normal   Communication Communication Communication: No difficulties   Cognition Arousal/Alertness: Awake/alert Behavior During Therapy: WFL for tasks assessed/performed Overall Cognitive Status: Within Functional Limits for tasks assessed                                     General Comments       Exercises     Shoulder Instructions      Home Living Family/patient expects to be discharged to:: Private residence Living Arrangements: Spouse/significant other Available Help at Discharge: Family;Available PRN/intermittently Type of Home: Independent living facility Home Access: Elevator     Home Layout: One level  Bathroom Shower/Tub: Tub/shower unit;Walk-in shower   Bathroom Toilet: Standard     Home Equipment: Toilet riser;Walker - 2 wheels   Additional Comments: independent with wife at [employer].      Prior Functioning/Environment Level of Independence: Independent with assistive device(s)        Comments: Has a RW for safety but doesn't need it he says. Says the ENT tells him to use it to make sure he doesn't fall. He does the cooking.        OT Problem List:        OT Treatment/Interventions:      OT Goals(Current goals can be found in the care plan section) Acute Rehab OT Goals OT Goal Formulation: All assessment and education complete, DC therapy  OT Frequency:     Barriers to D/C:            Co-evaluation              AM-PAC OT "6 Clicks" Daily Activity     Outcome Measure Help from another person  eating meals?: None Help from another person taking care of personal grooming?: None Help from another person toileting, which includes using toliet, bedpan, or urinal?: None Help from another person bathing (including washing, rinsing, drying)?: None Help from another person to put on and taking off regular upper body clothing?: None Help from another person to put on and taking off regular lower body clothing?: None 6 Click Score: 24   End of Session Nurse Communication: Mobility status  Activity Tolerance: Patient tolerated treatment well Patient left: in bed;with call bell/phone within reach  OT Visit Diagnosis: Feeding difficulties (R63.3);Other (comment)                Time: 5909-3112 OT Time Calculation (min): 18 min Charges:  OT General Charges $OT Visit: 1 Visit OT Evaluation $OT Eval Low Complexity: 1 Low  Christian Burns, OTR/L [place]  Office [phone] Pager: [place] 09/28/2020, 2:15 PM

## 2020-09-28 NOTE — Progress Notes (Signed)
Initial Nutrition Assessment  RD working remotely.  DOCUMENTATION CODES:   Underweight  INTERVENTION:  - will order Boost Breeze BID, each supplement provides 250 kcal and 9 grams of protein. - will order Ensure Enlive once/day, each supplement provides 350 kcal and 20 grams of protein. - will order Magic Cup BID with meals, each supplement provides 290 kcal and 9 grams of protein. - complete NFPE when feasible.    NUTRITION DIAGNOSIS:   Swallowing difficulty related to dysphagia as evidenced by per patient/family report,other (comment) (need for Dysphagia 3 diet).  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 She presented to the ED due to coughing/aspiration episode during outpatient esophagram.  He consumed 100% of breakfast (417 kcal and 11 grams protein).   He has not been seen by a [facility] RD since 06/2019.   Weight yesterday was documented as 125 lb, which appears to be a stated weight. Weight had been stable (126-134 lb) from 05/03/20-09/20/20. No information documented in the edema section of flow sheet.   Suspect some degree of malnutrition, but unable to confirm without NFPE.    Per notes: - acute hypoxic respiratory failure, aspiration pneumonitis with hx of multiple hospitalizations/ED visits for aspiration PNA - oropharyngeal dysphagia--seen by SLP today (2/17) and plan for MBS  - multiple myeloma with recurrence   Labs reviewed; BUN: 40 mg/dl,  creatinine: 1.62 mg/dl, Ca: 8.3 mg/dl, GFR: 41 ml/min.  Medications reviewed; 5 mg marinol BID, 40 mg oral protonix BID, 1 tablet senokot BID.    NUTRITION - FOCUSED PHYSICAL EXAM:  unable to complete at this time.  Diet Order:   Diet Order            DIET DYS 3 Room service appropriate? Yes; Fluid consistency: Thin  Diet effective now                 EDUCATION NEEDS:   No education needs have been identified at this time  Skin:  Skin Assessment: Reviewed RN Assessment  Last BM:  2/16  Height:   Ht Readings from Last 1 Encounters:  09/27/20 5' 9"  (1.753 m)    Weight:   Wt Readings from Last 1 Encounters:  09/27/20 56.7 kg     Estimated Nutritional Needs:  Kcal:  1650-1850 kcal Protein:  75-85 grams Fluid:  >/= 1.7 L/day      Jarome Matin, MS, RD, LDN, CNSC Inpatient Clinical Dietitian RD pager # available in AMION  After hours/weekend pager # available in [facility]

## 2020-09-28 NOTE — Progress Notes (Signed)
PROGRESS NOTE    Christian Burns  MGN:[id number] DOB: 02/05/1930 DOA: 09/27/2020 PCP: Leanna Battles, MD    Brief Narrative:  Christian Burns is an 87 year old male with past medical history significant for essential hypertension, CVA, CKD stage IIIb, multiple myeloma with recurrence currently on bendamustine and IVIG, jaw osteomyelitis on chronic antibiotics, iron deficiency anemia, history of upper GI bleeding February 2021 secondary to bleeding duodenal ulcer, esophageal candidiasis who presented to [facility] long ED following coughing/aspiration episode during outpatient esophagram.  During exam, patient's oxygen level noted to drop and hence was sent to the ED.  Patient is followed outpatient by gastroenterology, Dr. Bryan Lemma for his underlying dysphagia.  In the ED, temperature 97.8, HR 76, RR 18, BP 168/93, SPO2 100% on room air.  Sodium 139, potassium 4.2, chloride 108, CO2 23, glucose 127, BUN 43, creatinine 1.49.  WBC 2.5, hemoglobin 8.4, platelets 107.  Influenza A/B and SARS-CoV-2 negative.  Chest x-ray with tiny amount of aspirated barium noted left lung base and possibly right midlung with chronic interstitial changes.  Hospital service consulted for further evaluation and management of aspiration event with transient hypoxemia.   Assessment & Plan:   Principal Problem:   Dysphagia Active Problems:   Myeloma ([facility])   Multiple myeloma in relapse (HCC)   Iron deficiency anemia secondary to inadequate dietary iron intake   Essential hypertension   CKD (chronic kidney disease), stage III (HCC)   Protein-calorie malnutrition, severe   Aspiration pneumonitis (HCC)   Pancytopenia (HCC)   Acute hypoxic respiratory failure, POA Aspiration pneumonitis Patient presenting to the ED following aspiration event during outpatient esophagram.  Chest x-ray notable for barium aspirated left lung base and right midlung.  Patient with known history of aspiration events with multiple  hospitalizations/ED visits for aspiration pneumonia.  Patient currently afebrile without leukocytosis.  Initially required oxygen during ambulation in the ED. --Continue to monitor CBC daily fever curve --Ambulatory O2 evaluation --Will hold on antibiotic therapy at this time --Supportive care  Oropharyngeal dysphagia Patient with known dysphagia, follows with gastroenterology outpatient, Dr. Bryan Lemma.  Esophagram on 09/27/2020 terminated prematurely due to small volume aspiration of thick barium. --Speech therapy following, evaluation 2/17 with mild pharyngeal esophageal dysphagia with delayed throat clearing post swallow solids and globus sensation with hoarse vocal quality --dysphagia 3 diet with thin liquids --Plan for MBS to rule out aspiration and determine safest diet due to recent weight loss and recurrent PNA --Aspiration precautions  Essential hypertension Home regimen includes metoprolol succinate 25 mg p.o. daily, amlodipine 5 mg p.o. daily, benazepril 10 mg p.o. daily. --Continue metoprolol succinate 25 mg p.o. daily --Restart amlodipine 5 mg p.o. daily --Continue to hold home benazepril for now --Continue monitor BP closely  History of CVA --Continue aspirin 81 mg p.o. daily --Not on statin outpatient  CKD stage IIIb Baseline creatinine 1.4 to 1.8.  Creatinine 1.62 on admission, at baseline. --Holding home benazepril for now --Avoid nephrotoxins, renally dose all medications --Follow BMP daily  Multiple myeloma with recurrence --Currently on bendamustine, IVIG and prednisone 20 mg p.o. daily --Outpatient follow-up with medical oncology  Chronic osteomyelitis of jaw --Continue home penicillin 561m PO q6h  Pancytopenia Etiology likely secondary to malignancy and chemotherapy --Follow CBC daily  Chronic iron deficiency anemia Hemoglobin 7.8, at baseline. --CBC daily  History of upper GI bleed secondary to duodenal ulcer --Continue PPI twice daily  BPH:  Continue finasteride 5 mg p.o. daily  Adult failure to thrive Severe protein calorie malnutrition Body mass index  is 18.46 kg/m.  Significant muscle wasting noted on physical exam --nutrition consult    DVT prophylaxis: Lovenox   Code Status: Full Code Family Communication: Updated patient spouse, Christian Burns via telephone this morning  Disposition Plan:  Level of care: Med-Surg Status is: Observation  The patient remains OBS appropriate and will d/c before 2 midnights.  Dispo: The patient is from: Home              Anticipated d/c is to: Home              Anticipated d/c date is: 1 day              Patient currently is not medically stable to d/c.   Difficult to place patient No   Consultants:   None  Procedures:   Esophagram  MBS pending  Antimicrobials:   None   Subjective: Patient seen and examined bedside, resting comfortably.  No specific complaints this morning.  Discussed with patient aspiration event during esophagram yesterday.  Did require oxygen with ambulation yesterday while evaluated in the ED.  Discussed need of further evaluation of his dysphagia with speech therapy evaluation, patient agrees and understands.  No other complaints or concerns at this time.  Denies headache, no fever/chills/night sweats, no nausea/vomiting/diarrhea, no chest pain, palpitations, no abdominal pain, no weakness, no fatigue, no paresthesias.  No acute events overnight per nursing staff.  Objective: Vitals:   09/28/20 0518 09/28/20 0824 09/28/20 0953 09/28/20 1012  BP: (!) 159/91  (!) 176/93 (!) 172/72  Pulse: 68  72 72  Resp: 17  20   Temp: 97.9 F (36.6 C)  97.7 F (36.5 C)   TempSrc: Oral  Oral   SpO2: 98% 97% 100%   Weight:      Height:       No intake or output data in the 24 hours ending 09/28/20 1205 Filed Weights   09/27/20 1147  Weight: 56.7 kg    Examination:  General exam: Appears calm and comfortable, thin/cachectic/chronically ill in  appearance Respiratory system: Clear to auscultation. Respiratory effort normal.  On room air at rest Cardiovascular system: S1 & S2 heard, RRR. No JVD, murmurs, rubs, gallops or clicks. No pedal edema. Gastrointestinal system: Abdomen is nondistended, soft and nontender. No organomegaly or masses felt. Normal bowel sounds heard. Central nervous system: Alert and oriented. No focal neurological deficits. Extremities: Symmetric 5 x 5 power. Skin: No rashes, lesions or ulcers Psychiatry: Judgement and insight appear normal. Mood & affect appropriate.     Data Reviewed: I have personally reviewed following labs and imaging studies  CBC: Recent Labs  Lab 09/27/20 1533 09/28/20 0345  WBC 2.5* 2.2*  NEUTROABS 1.8  --   HGB 8.4* 7.8*  HCT 26.3* 23.7*  MCV 95.3 94.4  PLT 107* 035*   Basic Metabolic Panel: Recent Labs  Lab 09/27/20 1533 09/28/20 0345  NA 139 141  K 4.2 4.2  CL 108 109  CO2 23 27  GLUCOSE 127* 89  BUN 43* 40*  CREATININE 1.49* 1.62*  CALCIUM 8.5* 8.3*   GFR: Estimated Creatinine Clearance: 25.3 mL/min (A) (by C-G formula based on SCr of 1.62 mg/dL (H)). Liver Function Tests: No results for input(s): AST, ALT, ALKPHOS, BILITOT, PROT, ALBUMIN in the last 168 hours. No results for input(s): LIPASE, AMYLASE in the last 168 hours. No results for input(s): AMMONIA in the last 168 hours. Coagulation Profile: No results for input(s): INR, PROTIME in the last 168 hours. Cardiac Enzymes:  No results for input(s): CKTOTAL, CKMB, CKMBINDEX, TROPONINI in the last 168 hours. BNP (last 3 results) No results for input(s): PROBNP in the last 8760 hours. HbA1C: No results for input(s): HGBA1C in the last 72 hours. CBG: No results for input(s): GLUCAP in the last 168 hours. Lipid Profile: No results for input(s): CHOL, HDL, LDLCALC, TRIG, CHOLHDL, LDLDIRECT in the last 72 hours. Thyroid Function Tests: No results for input(s): TSH, T4TOTAL, FREET4, T3FREE, THYROIDAB in  the last 72 hours. Anemia Panel: No results for input(s): VITAMINB12, FOLATE, FERRITIN, TIBC, IRON, RETICCTPCT in the last 72 hours. Sepsis Labs: No results for input(s): PROCALCITON, LATICACIDVEN in the last 168 hours.  Recent Results (from the past 240 hour(s))  Resp Panel by RT-PCR (Flu A&B, Covid) Nasopharyngeal Swab     Status: None   Collection Time: 09/27/20  3:33 PM   Specimen: Nasopharyngeal Swab; Nasopharyngeal(NP) swabs in vial transport medium  Result Value Ref Range Status   SARS Coronavirus 2 by RT PCR NEGATIVE NEGATIVE Final    Comment: (NOTE) SARS-CoV-2 target nucleic acids are NOT DETECTED.  The SARS-CoV-2 RNA is generally detectable in upper respiratory specimens during the acute phase of infection. The lowest concentration of SARS-CoV-2 viral copies this assay can detect is 138 copies/mL. A negative result does not preclude SARS-Cov-2 infection and should not be used as the sole basis for treatment or other patient management decisions. A negative result may occur with  improper specimen collection/handling, submission of specimen other than nasopharyngeal swab, presence of viral mutation(s) within the areas targeted by this assay, and inadequate number of viral copies(<138 copies/mL). A negative result must be combined with clinical observations, patient history, and epidemiological information. The expected result is Negative.  Fact Sheet for Patients:  EntrepreneurPulse.com.au  Fact Sheet for Healthcare Providers:  IncredibleEmployment.be  This test is no t yet approved or cleared by the [place] FDA and  has been authorized for detection and/or diagnosis of SARS-CoV-2 by FDA under an Emergency Use Authorization (EUA). This EUA will remain  in effect (meaning this test can be used) for the duration of the COVID-19 declaration under Section 564(b)(1) of the Act, 21 U.S.C.section 360bbb-3(b)(1), unless the  authorization is terminated  or revoked sooner.       Influenza A by PCR NEGATIVE NEGATIVE Final   Influenza B by PCR NEGATIVE NEGATIVE Final    Comment: (NOTE) The Xpert Xpress SARS-CoV-2/FLU/RSV plus assay is intended as an aid in the diagnosis of influenza from Nasopharyngeal swab specimens and should not be used as a sole basis for treatment. Nasal washings and aspirates are unacceptable for Xpert Xpress SARS-CoV-2/FLU/RSV testing.  Fact Sheet for Patients: EntrepreneurPulse.com.au  Fact Sheet for Healthcare Providers: IncredibleEmployment.be  This test is not yet approved or cleared by the [place] FDA and has been authorized for detection and/or diagnosis of SARS-CoV-2 by FDA under an Emergency Use Authorization (EUA). This EUA will remain in effect (meaning this test can be used) for the duration of the COVID-19 declaration under Section 564(b)(1) of the Act, 21 U.S.C. section 360bbb-3(b)(1), unless the authorization is terminated or revoked.  Performed at [facility], [place] [address] [place], [place] [address]          Radiology Studies: DG Chest 2 View  Result Date: 09/27/2020 CLINICAL DATA:  Question aspiration. EXAM: CHEST - 2 VIEW COMPARISON:  Barium swallow 09/27/2020.  Chest x-ray 09/30/2019. FINDINGS: PowerPort catheter noted with tip over SVC. Cardiac monitor device noted over the left  chest. Heart size normal. Tiny amount of aspirated barium noted over the left lung base and possibly right mid lung. Chronic interstitial changes noted. Tiny left pleural effusion versus pleural scarring. No pneumothorax. IMPRESSION: 1. PowerPort catheter noted with tip over the SVC. 2. Tiny amount of aspirated barium noted over the left lung base and possibly right mid lung. Chronic interstitial changes noted. Electronically Signed   By: Marcello Moores  Register   On: 09/27/2020 13:54   DG ESOPHAGUS W DOUBLE CM (HD)  Result  Date: 09/27/2020 CLINICAL DATA:  Dysphagia. Additional history provided: Patient reports difficulty swallowing food and difficulty with food "coming back up." EXAM: ESOPHOGRAM / BARIUM SWALLOW / BARIUM TABLET STUDY TECHNIQUE: Combined double contrast and single contrast examination performed using effervescent crystals, thick barium liquid, and thin barium liquid. The patient was observed with fluoroscopy swallowing a 13 mm barium sulphate tablet. FLUOROSCOPY TIME:  Fluoroscopy Time:  2.2 minutes Radiation Exposure Index (if provided by the fluoroscopic device): 21.30 mGy Number of Acquired Spot Images: None COMPARISON:  Modified barium swallow 12/14/2019 FINDINGS: The patient had difficulty swallowing contrast at a rate to fully distend the esophagus. Within this limitation, no high-grade fixed esophageal stricture was identified. Moderate intermittent esophageal dysmotility. Small sliding hiatal hernia. Following small-volume aspiration of thick barium, the examination was prematurely terminated. A swallowed 13 mm barium tablet was mildly delayed at the level of the aortic arch with subsequent free passage into the stomach. The ordering provider, PA Alonza Bogus, was contacted at the conclusion of the examination and notified that the patient had aspirated with persistent coughing. It was decided that the patient should be further evaluated either by his primary care physician or in the emergency department. The patient and his grandson elected to be seen in the emergency department and were transferred to the ED following the examination at 11:45 a.m. on 09/27/2020. IMPRESSION: Prematurely terminated examination due to small-volume aspiration of thick barium. A modified barium swallow with speech pathology is recommended for further evaluation. Prior to exam termination, the patient had difficulty swallowing contrast at a rate to fully distend the esophagus. Within this limitation, no high-grade fixed esophageal  stricture is identified. A swallowed 13 mm barium tablet was mildly delayed at the level of the aortic arch with subsequent free passage into the stomach Moderate intermittent esophageal dysmotility. Small sliding hiatal hernia. Electronically Signed   By: Kellie Simmering DO   On: 09/27/2020 11:52        Scheduled Meds: . albuterol  2.5 mg Nebulization BID  . amLODipine  5 mg Oral Daily  . aspirin EC  81 mg Oral Daily  . Chlorhexidine Gluconate Cloth  6 each Topical Daily  . dronabinol  5 mg Oral BID AC  . DULoxetine  60 mg Oral Daily  . enoxaparin (LOVENOX) injection  30 mg Subcutaneous Q24H  . finasteride  5 mg Oral Daily  . metoprolol succinate  25 mg Oral Daily  . naphazoline-glycerin  2 drop Left Eye BID  . pantoprazole  40 mg Oral BID  . penicillin v potassium  500 mg Oral Q6H  . senna  1 tablet Oral BID   Continuous Infusions:   LOS: 0 days    Time spent: 39 minutes spent on chart review, discussion with nursing staff, consultants, updating family and interview/physical exam; more than 50% of that time was spent in counseling and/or coordination of care.    Berlene Dixson J [place] DO [facility] Available via Epic secure chat 7am-7pm After these  hours, please refer to coverage provider listed on [email or web address] 09/28/2020, 12:05 PM

## 2020-09-28 NOTE — Progress Notes (Signed)
SATURATION QUALIFICATIONS: (This note is used to comply with regulatory documentation for home oxygen)  Patient Saturations on Room Air at Rest = 98%  Patient Saturations on Room Air while Ambulating = 90-94%  Patient Saturations on 0 Liters of oxygen while Ambulating = 90-94%  Please briefly explain why patient needs home oxygen:  Patient does not need Home o2 based on this assessment this afternoon.    [place], Therapist, sports

## 2020-09-28 NOTE — Evaluation (Addendum)
Clinical/Bedside Swallow Evaluation Patient Details  Name: Christian Burns MRN: [id number] Date of Birth: Apr 03, 1930  Today's Date: 09/28/2020 Time: SLP Start Time (ACUTE ONLY): 1035 SLP Stop Time (ACUTE ONLY): 1055 SLP Time Calculation (min) (ACUTE ONLY): 20 min  Past Medical History:  Past Medical History:  Diagnosis Date  . Anemia   . Blood transfusion without reported diagnosis   . CKD (chronic kidney disease) 07/06/2018   CKD stage III  . Goals of care, counseling/discussion 02/04/2019  . History of radiation therapy 06/17/13-07/06/13   35 gray to upper lumbar spine  . 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 Hypertension   . Inguinal hernia   . Multiple myeloma [facility]) Sept 2010  . Multiple myeloma in relapse ([place]) 04/05/2016  . Radiation 09/26/15-10/20/15   lower thoracic spine, upper lumbar spine 30 gray  . Stroke [facility])    Past Surgical History:  Past Surgical History:  Procedure Laterality Date  . COLONOSCOPY    . HIP PINNING,CANNULATED Right 09/01/2018   Procedure: [facility] NAIL HIP PINNING;  Surgeon: Melrose Nakayama, MD;  Location: [facility];  Service: Orthopedics;  Laterality: Right;  . IR IMAGING GUIDED PORT INSERTION  11/01/2019  . LOOP RECORDER INSERTION N/A 04/21/2019   Procedure: LOOP RECORDER INSERTION;  Surgeon: Evans Lance, MD;  Location: [facility];  Service: Cardiovascular;  Laterality: N/A;  . TEE WITHOUT CARDIOVERSION N/A 10/09/2018   Procedure: TRANSESOPHAGEAL ECHOCARDIOGRAM (TEE);  Surgeon: Thayer Headings, MD;  Location: [facility] ENDOSCOPY;  Service: Cardiovascular;  Laterality: N/A;  . TONSILLECTOMY     HPI:  87 y.o. 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  Patient was seen at GI Dr. Vivia Ewing  office on 2/9 for dysphagia.  He complained of food getting stuck in his throat, having hard time swallowing and taking long time to eat.  He had weight loss as well.  He was ordered for an esophagogram to rule out esophageal issues.  Patient presented as an outpatient to radiology today to get it done.  During the procedure, he aspirated and started coughing.  His oxygen level dropped and hence he was sent to the ED. Esophagram on 09/27/20 indicated Prematurely terminated examination due to small-volume aspiration of thick barium. A modified barium swallow with speech pathology is recommended for further evaluation. CXR on 09/27/20 indicated Tiny amount of aspirated barium noted over the left lung base and possibly right mid lung. Chronic interstitial changes noted; BSE generated.  Assessment / Plan / Recommendation Clinical Impression  Pt seen for clinical swallowing evaluation with mild pharyngoesophageal dysphagia noted characterized by delayed throat clearing post-swallow with solids and pt c/o globus sensation with vocal quality slightly hoarse during conversation.  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  Pt with hx of esophageal candida/issues per chart review as well. Hospitalized 09/27/20 d/t aspiration event with esophagram.  Recommend initiating Dysphagia 3/thin liquids and MBS to r/o aspiration and determine safest diet d/t recent weight loss and recurrent PNA.  Thank you for this consult. SLP Visit Diagnosis: Dysphagia, pharyngoesophageal phase (R13.14)    Aspiration Risk  Mild aspiration risk    Diet Recommendation   Dysphagia 3/thin  liquids  Medication Administration: Whole meds with liquid (1 at a time)    Other  Recommendations Oral Care Recommendations: Oral care BID   Follow up Recommendations Other (comment) (TBD)      Frequency  and Duration Other (Comment) (pending instrumental study)  1 week       Prognosis Prognosis for Safe Diet Advancement: Good      Swallow Study   General Date of Onset: 09/27/20 HPI: 87 y.o. 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  Patient was seen at GI Dr. Vivia Ewing office on 2/9 for dysphagia.  He complained of food getting stuck in his throat, having hard time swallowing and taking long time to eat.  He had weight loss as well.  He was ordered for an esophagogram to rule out esophageal issues.  Patient presented as an outpatient to radiology today to get it done.  During the procedure, he aspirated and started coughing.  His oxygen level dropped and hence he was sent to the ED. Type of Study: Bedside Swallow Evaluation Previous Swallow Assessment: Esophagram on 09/27/20 with aspiration noted, so examination ended and pt admitted for aspiration PNA Diet Prior to this Study: Dysphagia 1 (puree);Thin liquids Temperature Spikes Noted: No Respiratory Status: Room air History of Recent Intubation: No Behavior/Cognition: Alert;Cooperative Oral Cavity Assessment: Dry Oral Care Completed by SLP: Other (Comment) (recent completion by pt) Oral Cavity - Dentition: Adequate natural dentition (hx of osteomyelitis) Vision: Functional for self-feeding Self-Feeding Abilities: Able to feed self Patient Positioning: Upright in bed Baseline Vocal Quality: Hoarse;Other (comment) (slight hoarseness noted) Volitional Cough: Strong Volitional Swallow: Able to elicit    Oral/Motor/Sensory Function Overall Oral Motor/Sensory Function: Within functional limits   Ice Chips Ice chips: Not tested   Thin Liquid Thin Liquid: Within functional  limits Presentation: Cup    Nectar Thick Nectar Thick Liquid: Not tested   Honey Thick Honey Thick Liquid: Not tested   Puree Puree: Within functional limits Presentation: Self Fed   Solid     Solid: Impaired Presentation: Self Fed Pharyngeal Phase Impairments: Throat Clearing - Delayed (1x after all consistencies assessed)      Elvina Sidle, M.S., CCC-SLP 09/28/2020,11:16 AM

## 2020-09-28 NOTE — Plan of Care (Signed)
Care plan initiated and discussed.

## 2020-09-29 DIAGNOSIS — N1831 Chronic kidney disease, stage 3a: Secondary | ICD-10-CM | POA: Diagnosis not present

## 2020-09-29 DIAGNOSIS — R1310 Dysphagia, unspecified: Secondary | ICD-10-CM | POA: Diagnosis not present

## 2020-09-29 DIAGNOSIS — J690 Pneumonitis due to inhalation of food and vomit: Secondary | ICD-10-CM | POA: Diagnosis not present

## 2020-09-29 DIAGNOSIS — I10 Essential (primary) hypertension: Secondary | ICD-10-CM | POA: Diagnosis not present

## 2020-09-29 LAB — CBC
HCT: 23.1 % — ABNORMAL LOW (ref 39.0–52.0)
Hemoglobin: 7.6 g/dL — ABNORMAL LOW (ref 13.0–17.0)
MCH: 31 pg (ref 26.0–34.0)
MCHC: 32.9 g/dL (ref 30.0–36.0)
MCV: 94.3 fL (ref 80.0–100.0)
Platelets: 103 10*3/uL — ABNORMAL LOW (ref 150–400)
RBC: 2.45 MIL/uL — ABNORMAL LOW (ref 4.22–5.81)
RDW: 16.6 % — ABNORMAL HIGH (ref 11.5–15.5)
WBC: 1.8 10*3/uL — ABNORMAL LOW (ref 4.0–10.5)
nRBC: 0 % (ref 0.0–0.2)

## 2020-09-29 LAB — BASIC METABOLIC PANEL
Anion gap: 6 (ref 5–15)
BUN: 41 mg/dL — ABNORMAL HIGH (ref 8–23)
CO2: 26 mmol/L (ref 22–32)
Calcium: 8.1 mg/dL — ABNORMAL LOW (ref 8.9–10.3)
Chloride: 107 mmol/L (ref 98–111)
Creatinine, Ser: 1.64 mg/dL — ABNORMAL HIGH (ref 0.61–1.24)
GFR, Estimated: 40 mL/min — ABNORMAL LOW (ref >60)
Glucose, Bld: 78 mg/dL (ref 70–99)
Potassium: 4 mmol/L (ref 3.5–5.1)
Sodium: 139 mmol/L (ref 135–145)

## 2020-09-29 MED ORDER — HEPARIN SOD (PORK) LOCK FLUSH 100 UNIT/ML IV SOLN
500.0000 [IU] | INTRAVENOUS | Status: AC | PRN
  Administered 2020-09-29: 500 [IU]
  Filled 2020-09-29: qty 5

## 2020-09-29 NOTE — Discharge Instructions (Signed)
Dysphagia Eating Plan, Minced and Moist Foods This eating plan is for people with moderate swallowing and chewing problems who are transitioning from pureed to solid foods. Moist and minced foods are soft and cut into very small chunks so that they can be swallowed safely. On this eating plan, you may be instructed to drink liquids that are thickened. Work with your health care provider and your diet and nutrition specialist (dietitian) to make sure that you are following the eating plan safely and getting all the nutrients you need. What are tips for following this plan? General information  You may eat foods that are soft and moist.  Always test food texture before taking a bite. Poke food with a fork or spoon to make sure it is tender. Talk with your health care provider about specific ways to test the size, texture, and safety of foods.  Take small bites. Each bite should be smaller than the fingernail of your little finger (about 4 mm by 4 mm).  If you were on a pureed food eating plan, you may still eat any of the foods included in that diet.  Avoid foods that are dry, hard, sticky, chewy, coarse, or crunchy.  Avoid foods that separate into thin liquids and solids, such as cereal with milk or chunky soups. If you eat cold cereal, let it soften in milk and then drain excess milk before eating.  Avoid liquids that have seeds or chunks.  If instructed by your health care provider, thicken liquids. Follow your health care provider's instructions about what products to use, how to do this, and to what thickness. ? You may use a commercial thickener, rice cereal, or potato flakes. ? Thickened liquids are usually a "pudding-like" consistency, or they may be as thick as honey or thick enough to eat with a spoon.   Cooking  You may need to use a blender, whisk, or masher to soften some of your foods.  To moisten foods, you may add liquids while you are blending, mashing, or grinding your  foods to the right consistency. These liquids include gravies, sauces, vegetable or fruit juice, milk, half and half, or water.  Reheat foods slowly to prevent a tough crust from forming. Meal planning  Eat a variety of foods in order to get all the nutrients you need.  Follow your meal plan as told by your health care provider or dietitian. What foods can I eat? Grains Soaked soft breads without nuts or seeds. Pancakes, sweet rolls, pastries, and [place] toast that have been moistened with syrup or sauce. Well-cooked pasta, noodles, rice, and bread dressing in very small pieces and thick sauce. Soft dumplings or spaetzle in very small pieces and butter or gravy. Soft-cooked cereals. Cold cereal that has been softened in milk and the excess milk drained. Fruits Canned or cooked fruits that are soft or moist and do not have skin or seeds. Fresh, soft bananas. Thickened fruit juices. Vegetables Very soft, well-cooked vegetables in very small pieces. Soft-cooked, mashed potatoes. Thickened vegetable juice. Meats and other proteins Tender, moist, and finely minced or ground meats or poultry. Moist meatballs or meatloaf. Fish without bones. Scrambled, poached, or soft-cooked eggs. Tofu. Tempeh and meat alternatives in very small pieces. Well-cooked, moistened and mashed beans, baked beans, peas, and other legumes. Dairy Thickened milk. Cream cheese. Yogurt. Cottage cheese. Sour cream. Fats and oils Butter. Margarine. Cream for cereal, depending on liquid consistency allowed. Gravy. Cream sauces. Mayonnaise. Sweets and desserts Pudding. Custard. Ice cream  and sherbet. Whipped toppings. Soft, moist cakes. Icing. Jelly. Jams and preserves without seeds. Seasonings and other foods Sauces and salsas that have soft chunks that are smaller than 4 mm. Salad dressings. Casseroles with small pieces of tender meat. All seasonings and sweeteners. Beverages Anything prepared at the thickness recommended by  your dietitian. The items listed above may not be a complete list of foods and beverages you can eat. Contact a dietitian for more information. What foods must I avoid? Grains Breads that are hard or have nuts or seeds. Dry biscuits, pancakes, waffles, and bread dressing. Coarse cereals. Cereals that have nuts, seeds, dried fruits, or coconut. Sticky rice. Large pieces of pasta. Fruits Hard, crunchy, stringy, high-pulp, and juicy raw fruits such as apples, pineapple, papaya, and watermelon. Fruits with skins and seeds, such as grapes. Dried fruit and fruit leather. Vegetables All raw vegetables. Tough, fibrous, chewy, or stringy cooked vegetables, such as celery, peas, broccoli, cabbage, Brussels sprouts, and asparagus. Potato skins. Potato and other vegetable chips. Fried or French-fried potatoes. Cooked corn and peas. Meats and other proteins Large pieces of meat. Dry, tough meats, such as bacon, sausage, and hot dogs. Chicken, [place], or fish with skin and bones. Crunchy peanut butter. Nuts. Seeds. Dairy Yogurt with nuts, seeds, or large chunks. Large chunks of cheese. Sweets and desserts Coarse, hard, chewy, or sticky desserts. Any dessert with nuts, seeds, coconut, pineapple, or dried fruit. Bread pudding. Ask your health care provider whether you can have frozen desserts. Seasonings and other foods Soups and casseroles with large chunks. Sandwiches. Pizza. The items listed above may not be a complete list of foods and beverages you should avoid. Contact a dietitian for more information. Summary  Moist and minced foods can be helpful for people with moderate swallowing and chewing problems.  On this dysphagia eating plan, you may eat foods that are soft, moist, and cut into pieces smaller than 4 mm by 4 mm.  You may be instructed to thicken liquids. Follow your health care provider's instructions about how to do this and to what consistency. This information is not intended to replace  advice given to you by your health care provider. Make sure you discuss any questions you have with your health care provider. Document Revised: 04/17/2020 Document Reviewed: 04/17/2020 Elsevier Patient Education  2021 [place] [facility] in [place] (8th ed., pp. [phone]). [place], Protection: Elsevier."> Marya Amsler of [employer] (21st ed., pp. [phone]). [place], [place]: Elsevier, Inc.">  Dysphagia  Dysphagia is trouble swallowing. This condition occurs when solids and liquids stick in a person's throat on the way down to the stomach, or when food takes longer to get to the stomach than usual. You may have problems swallowing food, liquids, or both. You may also have pain while trying to swallow. It may take you more time and effort to swallow something. What are the causes? This condition may be caused by:  Muscle problems. These may make it difficult for you to move food and liquids through the esophagus, which is the tube that connects your mouth to your stomach.  Blockages. You may have ulcers, scar tissue, or inflammation that blocks the normal passage of food and liquids. Causes of these problems include: ? Acid reflux from your stomach into your esophagus (gastroesophageal reflux). ? Infections. ? Radiation treatment for cancer. ? Medicines taken without enough fluids to wash them down into your stomach.  Stroke. This can affect the nerves and make it difficult to swallow.  Nerve  problems. These prevent signals from being sent to the muscles of your esophagus to squeeze (contract) and move what you swallow down to your stomach.  Globus pharyngeus. This is a common problem that involves a feeling like something is stuck in your throat or a sense of trouble with swallowing, even though nothing is wrong with the swallowing passages.  Certain conditions, such as cerebral palsy or Parkinson's disease. What are the signs or symptoms? Common symptoms of this  condition include:  A feeling that solids or liquids are stuck in your throat on the way down to the stomach.  Pain while swallowing.  Coughing or gagging while trying to swallow. Other symptoms include:  Food moving back from your stomach to your mouth (regurgitation).  Noises coming from your throat.  Chest discomfort when swallowing.  A feeling of fullness when swallowing.  Drooling, especially when the throat is blocked.  Heartburn. How is this diagnosed? This condition may be diagnosed by:  Barium swallow X-ray. In this test, you will swallow a white liquid that sticks to the inside of your esophagus. X-ray images are then taken.  Endoscopy. In this test, a flexible telescope is inserted down your throat to look at your esophagus and your stomach.  CT scans or an MRI. How is this treated? Treatment for dysphagia depends on the cause of this condition:  If the dysphagia is caused by acid reflux or infection, medicines may be used. These may include antibiotics or heartburn medicines.  If the dysphagia is caused by problems with the muscles, swallowing therapy may be used to help you strengthen your swallowing muscles. You may have to do specific exercises to strengthen the muscles or stretch them.  If the dysphagia is caused by a blockage or mass, procedures to remove the blockage may be done. You may need surgery and a feeding tube. You may need to make diet changes. Ask your health care provider for specific instructions. Follow these instructions at home: Medicines  Take over-the-counter and prescription medicines only as told by your health care provider.  If you were prescribed an antibiotic medicine, take it as told by your health care provider. Do not stop taking the antibiotic even if you start to feel better. Eating and drinking  Make any diet changes as told by your health care provider.  Work with a diet and nutrition specialist (dietitian) to create an  eating plan that will help you get the nutrients you need in order to stay healthy.  Eat soft foods that are easier to swallow.  Cut your food into small pieces and eat slowly. Take small bites.  Eat and drink only when you are sitting upright.  Do not drink alcohol or caffeine. If you need help quitting, ask your health care provider.   General instructions  Check your weight every day to make sure you are not losing weight.  Do not use any products that contain nicotine or tobacco. These products include cigarettes, chewing tobacco, and vaping devices, such as e-cigarettes. If you need help quitting, ask your health care provider.  Keep all follow-up visits. This is important. Contact a health care provider if:  You lose weight because you cannot swallow.  You cough when you drink liquids.  You cough up partially digested food. Get help right away if:  You cannot swallow your saliva.  You have shortness of breath, a fever, or both.  Your voice is hoarse and you have trouble swallowing. These symptoms may represent a  serious problem that is an emergency. Do not wait to see if the symptoms will go away. Get medical help right away. Call your local emergency services (911 in the [place] Do not drive yourself to the hospital. Summary  Dysphagia is trouble swallowing. This condition occurs when solids and liquids stick in a person's throat on the way down to the stomach. You may cough or gag while trying to swallow.  Dysphagia has many possible causes.  Treatment for dysphagia depends on the cause of the condition.  Keep all follow-up visits. This is important. This information is not intended to replace advice given to you by your health care provider. Make sure you discuss any questions you have with your health care provider. Document Revised: 03/18/2020 Document Reviewed: 03/18/2020 Elsevier Patient Education  2021 [employer].

## 2020-09-29 NOTE — Discharge Summary (Signed)
Physician Discharge Summary  Christian Burns EZM:[id number] DOB: 02-11-1930 DOA: 09/27/2020  PCP: Leanna Battles, MD  Admit date: 09/27/2020 Discharge date: 09/29/2020  Admitted From: [facility] Disposition:  [facility]  Recommendations for Outpatient Follow-up:  1. Follow up with PCP in 1-2 weeks 2. Follow-up with gastroenterology, Dr. Bryan Lemma as scheduled 3. 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  During exam, patient's oxygen level noted to drop and hence was sent to the ED.  Patient is followed outpatient by gastroenterology, Dr. Bryan Lemma for his underlying dysphagia.  In the ED, temperature 97.8, HR 76, RR 18, BP 168/93, SPO2 100% on room air.  Sodium 139, potassium 4.2, chloride 108, CO2 23, glucose 127, BUN 43, creatinine 1.49.  WBC 2.5, hemoglobin 8.4, platelets 107.  Influenza A/B and SARS-CoV-2 negative.  Chest x-ray with tiny amount of aspirated barium noted left lung base and possibly right midlung with chronic interstitial changes.  Hospital service consulted for further evaluation and management of aspiration event with transient hypoxemia.  Hospital course:  Acute hypoxic respiratory failure, POA Aspiration pneumonitis Patient presenting to the ED following  aspiration event during outpatient esophagram.  Chest x-ray notable for barium aspirated left lung base and right midlung.  Patient with known history of aspiration events with multiple hospitalizations/ED visits for aspiration pneumonia.  Patient currently afebrile without leukocytosis.  Initially required oxygen during ambulation in the ED. antibiotics were not initiated.  Patient was monitored closely and remained on room air.  Ambulatory walk screening with no signs of hypoxia; and no supplement oxygen needed on discharge.  Oropharyngeal/esophageal dysphagia Patient with known dysphagia, follows with gastroenterology outpatient, Dr. Bryan Lemma. Esophagram on 09/27/2020 terminated prematurely due to small volume aspiration of thick barium. Speech therapy was consulted and evaluation 2/17 with mild pharyngeal esophageal dysphagia with delayed throat clearing post swallow solids and globus sensation with hoarse vocal quality. MBS 2/17 with findings of penetration into laryngeal vestibule with larger volumes of successive swallows with thin liquids but no aspiration noted with any consistencies.  Recommend dysphagia 3 diet with soft/moistened foods and thin liquids.  Aspiration precautions and outpatient follow-up with gastroenterology.  Subconjunctival hemorrhage OS Patient with scleral redness to left eye.  Patient reports seen by his eye doctor.  No vision disturbance and no drainage, unlikely infection/conjunctivitis.  Continue saline eyedrops.  Essential hypertension Continue home metoprolol succinate 25 mg p.o. daily, amlodipine 5 mg p.o. daily, benazepril 10 mg p.o. daily.  History of CVA Continue aspirin 81 mg p.o. daily.  Not on statin outpatient  CKD stage IIIb Baseline creatinine 1.4 to 1.8.  Creatinine 1.62 on admission, at baseline.  Multiple myeloma with recurrence Currently on bendamustine, IVIG and prednisone 20 mg p.o. daily. Outpatient follow-up with medical oncology  Chronic  osteomyelitis of jaw Continue home penicillin 570m PO q6h  Pancytopenia Etiology likely secondary to malignancy and chemotherapy.  Stable  Chronic iron deficiency anemia Stable.  History of upper GI bleed secondary to duodenal  ulcer Continue PPI twice daily  BPH: Continue finasteride 5 mg p.o. daily  Adult failure to thrive Severe protein calorie malnutrition Body mass index is 18.46 kg/m.  Significant muscle wasting noted on physical exam.  Seen by dietitian with recommendations of increased protein supplementation with boost/Ensure.  Discharge Diagnoses:  Principal Problem:   Dysphagia Active Problems:   Myeloma ([place])   Multiple myeloma in relapse (HCC)   Iron deficiency anemia secondary to inadequate dietary iron intake   Essential hypertension   CKD (chronic kidney disease), stage III (HCC)   Protein-calorie malnutrition, severe   Aspiration pneumonitis (HCC)   Pancytopenia (HCC)    Discharge Instructions  Discharge Instructions    Call MD for:  difficulty breathing, headache or visual disturbances   Complete by: As directed    Call MD for:  extreme fatigue   Complete by: As directed    Call MD for:  persistant dizziness or light-headedness   Complete by: As directed    Call MD for:  persistant nausea and vomiting   Complete by: As directed    Call MD for:  severe uncontrolled pain   Complete by: As directed    Call MD for:  temperature >100.4   Complete by: As directed    Diet - low sodium heart healthy   Complete by: As directed    Increase activity slowly   Complete by: As directed      Allergies as of 09/29/2020      Reactions   Sulfa Antibiotics Itching   Sulfasalazine Itching      Medication List    TAKE these medications   amLODipine-benazepril 5-10 MG capsule Commonly known as: LOTREL TAKE ONE CAPSULE EACH DAY What changed: See the new instructions.   aspirin EC 81 MG tablet Take 1 tablet (81 mg total) by mouth daily. Swallow  whole.   dronabinol 5 MG capsule Commonly known as: MARINOL TAKE ONE CAPSULE TWICE A DAY BEFORE A MEAL   DULoxetine 60 MG capsule Commonly known as: CYMBALTA TAKE ONE CAPSULE EACH DAY   Eye Lubricant Oint Apply 1 application to eye in the morning and at bedtime. If left eye lower eye lid is irritated   finasteride 5 MG tablet Commonly known as: PROSCAR TAKE ONE TABLET EACH DAY What changed: See the new instructions.   lidocaine-prilocaine cream Commonly known as: EMLA Apply 1 application topically as needed. What changed:   when to take this  reasons to take this   loperamide 2 MG tablet Commonly known as: IMODIUM A-D Take 2 mg by mouth daily.   metoprolol succinate 25 MG 24 hr tablet Commonly known as: TOPROL-XL Take 25 mg by mouth daily.   ondansetron 4 MG tablet Commonly known as: ZOFRAN Take 1 tablet (4 mg total) by mouth every 6 (six) hours as needed for nausea.   pantoprazole 40 MG tablet Commonly known as: Protonix Take 1 tablet (40 mg total) by mouth 2 (two) times daily. What changed: when to take this   penicillin v potassium 500 MG tablet Commonly known as: VEETID TAKE ONE TABLET FOUR TIMES DAILY   polyvinyl alcohol 1.4 % ophthalmic solution Commonly known as: LIQUIFILM TEARS Place 1 drop into both eyes as needed for dry eyes.   predniSONE 20 MG tablet Commonly known as: DELTASONE TAKE ONE TABLET EACH DAY WITH BREAKFAST   PROBIOTIC PO Take 1 capsule by mouth daily.   pyridOXINE 100 MG tablet Commonly known as: VITAMIN B-6 Take 100 mg by mouth  daily.   QC MULTI-VITE 50 & OVER PO TAKE ONE TABLET DAILY   traMADol 50 MG tablet Commonly known as: ULTRAM Take 1 tablet (50 mg total) by mouth every 8 (eight) hours as needed for moderate pain.   vitamin B-12 500 MCG tablet Commonly known as: CYANOCOBALAMIN Take 500 mcg by mouth daily.   Vitamin D (Ergocalciferol) 1.25 MG (50000 UNIT) Caps capsule Commonly known as: DRISDOL Take 50,000 Units  by mouth every Sunday.       Follow-up Information    Leanna Battles, MD. Schedule an appointment as soon as possible for a visit in 1 week(s).   Specialty: Internal Medicine Contact information: [place] [address] [place], [place], [place] Follow up.   Specialty: Gastroenterology Contact information: [address] [place] [address] [phone]              Allergies  Allergen Reactions  . Sulfa Antibiotics Itching  . Sulfasalazine Itching    Consultations:  None   Procedures/Studies: DG Chest 2 View  Result Date: 09/27/2020 CLINICAL DATA:  Question aspiration. EXAM: CHEST - 2 VIEW COMPARISON:  Barium swallow 09/27/2020.  Chest x-ray 09/30/2019. FINDINGS: PowerPort catheter noted with tip over SVC. Cardiac monitor device noted over the left chest. Heart size normal. Tiny amount of aspirated barium noted over the left lung base and possibly right mid lung. Chronic interstitial changes noted. Tiny left pleural effusion versus pleural scarring. No pneumothorax. IMPRESSION: 1. PowerPort catheter noted with tip over the SVC. 2. Tiny amount of aspirated barium noted over the left lung base and possibly right mid lung. Chronic interstitial changes noted. Electronically Signed   By: Marcello Moores  Register   On: 09/27/2020 13:54   DG Swallowing Func-Speech Pathology  Result Date: 09/28/2020 Objective Swallowing Evaluation: Type of Study: MBS-Modified Barium Swallow Study  Patient Details Name: LAKE CINQUEMANI MRN: [id number] Date of Birth: 22-Aug-1929 Today's Date: 09/28/2020 Time: SLP Start Time (ACUTE ONLY): 4403 -SLP Stop Time (ACUTE ONLY): 1300 SLP Time Calculation (min) (ACUTE ONLY): 15 min Past Medical History: Past Medical History: Diagnosis Date . Anemia  . Blood transfusion without reported diagnosis  . CKD (chronic kidney disease) 07/06/2018  CKD stage III . Goals of care, counseling/discussion 02/04/2019 . History of  radiation therapy 06/17/13-07/06/13  35 gray to upper lumbar spine . 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 Hypertension  . Inguinal hernia  . Multiple myeloma [facility]) Sept 2010 . Multiple myeloma in relapse ([place]) 04/05/2016 . Radiation 09/26/15-10/20/15  lower thoracic spine, upper lumbar spine 30 gray . Stroke [facility])  Past Surgical History: Past Surgical History: Procedure Laterality Date . COLONOSCOPY   . HIP PINNING,CANNULATED Right 09/01/2018  Procedure: [facility] NAIL HIP PINNING;  Surgeon: Melrose Nakayama, MD;  Location: [facility];  Service: Orthopedics;  Laterality: Right; . IR IMAGING GUIDED PORT INSERTION  11/01/2019 . LOOP RECORDER INSERTION N/A 04/21/2019  Procedure: LOOP RECORDER INSERTION;  Surgeon: Evans Lance, MD;  Location: [facility] CV LAB;  Service: Cardiovascular;  Laterality: N/A; . TEE WITHOUT CARDIOVERSION N/A 10/09/2018  Procedure: TRANSESOPHAGEAL ECHOCARDIOGRAM (TEE);  Surgeon: Thayer Headings, MD;  Location: [facility] ENDOSCOPY;  Service: Cardiovascular;  Laterality: N/A; . TONSILLECTOMY   HPI: 87 y.o. 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  Patient was seen at GI Dr. Vivia Ewing office on 2/9 for dysphagia.  He complained of food getting stuck in his throat, having hard time swallowing and taking long time to eat.  He had weight loss as well.  He was ordered for an esophagogram to rule out esophageal issues.  Patient presented as an outpatient to radiology today to get it done.  During the procedure, he aspirated and started coughing.  His oxygen level dropped and hence he was sent to the ED.  Subjective: "I have had issues with swallowing for a while" Assessment / Plan / Recommendation CHL IP CLINICAL IMPRESSIONS 09/28/2020 Clinical Impression  Pt presents with mild-moderate pharyngoesophageal dysphagia characterized by penetration into laryngeal vestibule with larger volumes of successive swallows with thin liquids, but no aspiration noted during this study with any consistencies.  Puree/solids/pill consistencies yielded mild-mod vallecular/pyriform residue with clearance to mild-insignificant with repetitive swallows and effortful swallow and/or liquid wash to clear pharynx/pyriform sinuses/vallecular space. Decreased UES opening and Esophageal backflow noted into cervical esophagus intermittently during study with pill/solids/puree.  Esophageal sweep with potential for decreased peristalsis within the distal esophagus per observation.  Pt is at mild-moderate risk for aspiration without utilization of swallowing strategies/aspiration precautions during meals including slow rate, small bites/sips, effortful swallow and repetitive swallows or liquid wash.  Recommend supplements to maintain nutrition via high protein shakes and/or high protein/caloric foods and follow esophageal precautions as well during intake.  Discussed precautions/strategies with pt and grandson (via phone), [name].  Recommend continue soft/thin liquids as tolerated with medication via puree/liquids with small amounts.   SLP Visit Diagnosis Dysphagia, pharyngoesophageal phase (R13.14) Attention and concentration deficit following -- Frontal lobe and executive function deficit following -- Impact on safety and function Mild aspiration risk;Moderate aspiration risk   CHL IP TREATMENT RECOMMENDATION 09/28/2020 Treatment Recommendations Therapy as outlined in treatment plan below   Prognosis 09/28/2020 Prognosis for Safe Diet Advancement Good Barriers to Reach Goals -- Barriers/Prognosis Comment -- CHL IP DIET RECOMMENDATION 09/28/2020 SLP Diet Recommendations Dysphagia 3 (Mech soft) solids;Thin liquid Liquid Administration via Cup;No straw Medication Administration Whole meds with liquid  Compensations Slow rate;Small sips/bites;Multiple dry swallows after each bite/sip;Follow solids with liquid;Clear throat intermittently Postural Changes Seated upright at 90 degrees   CHL IP OTHER RECOMMENDATIONS 09/28/2020 Recommended Consults Other (Comment) Oral Care Recommendations Oral care BID Other Recommendations --   CHL IP FOLLOW UP RECOMMENDATIONS 09/28/2020 Follow up Recommendations Other (comment)   CHL IP FREQUENCY AND DURATION 09/28/2020 Speech Therapy Frequency (ACUTE ONLY) min 1 x/week Treatment Duration 1 week      CHL IP ORAL PHASE 09/28/2020 Oral Phase WFL Oral - Pudding Teaspoon -- Oral - Pudding Cup -- Oral - Honey Teaspoon -- Oral - Honey Cup -- Oral - Nectar Teaspoon -- Oral - Nectar Cup -- Oral - Nectar Straw -- Oral - Thin Teaspoon -- Oral - Thin Cup -- Oral - Thin Straw -- Oral - Puree -- Oral - Mech Soft -- Oral - Regular -- Oral - Multi-Consistency -- Oral - Pill -- Oral Phase - Comment --  CHL IP PHARYNGEAL PHASE 09/28/2020 Pharyngeal Phase Impaired Pharyngeal- Pudding Teaspoon -- Pharyngeal -- Pharyngeal- Pudding Cup -- Pharyngeal -- Pharyngeal- Honey Teaspoon -- Pharyngeal -- Pharyngeal- Honey Cup -- Pharyngeal -- Pharyngeal- Nectar Teaspoon -- Pharyngeal -- Pharyngeal- Nectar Cup -- Pharyngeal -- Pharyngeal- Nectar Straw -- Pharyngeal -- Pharyngeal- Thin Teaspoon (No Data) Pharyngeal -- Pharyngeal- Thin Cup Penetration/Aspiration during swallow;Pharyngeal residue - pyriform;Pharyngeal residue - valleculae  Pharyngeal Material enters airway, remains ABOVE vocal cords and not ejected out Pharyngeal- Thin Straw (No Data) Pharyngeal -- Pharyngeal- Puree Pharyngeal residue - valleculae Pharyngeal Material does not enter airway Pharyngeal- Mechanical Soft Pharyngeal residue - pyriform;Pharyngeal residue - valleculae Pharyngeal Material does not enter airway Pharyngeal- Regular Pharyngeal residue - valleculae;Pharyngeal residue - pyriform Pharyngeal Material does not enter airway Pharyngeal-  Multi-consistency -- Pharyngeal -- Pharyngeal- Pill Pharyngeal residue - valleculae;Pharyngeal residue - pyriform Pharyngeal Material does not enter airway Pharyngeal Comment --  CHL IP CERVICAL ESOPHAGEAL PHASE 09/28/2020 Cervical Esophageal Phase Impaired Pudding Teaspoon -- Pudding Cup -- Honey Teaspoon -- Honey Cup -- Nectar Teaspoon -- Nectar Cup -- Nectar Straw -- Thin Teaspoon (No Data) Thin Cup -- Thin Straw (No Data) Puree Reduced cricopharyngeal relaxation Mechanical Soft Reduced cricopharyngeal relaxation;Esophageal backflow into cervical esophagus Regular Esophageal backflow into cervical esophagus Multi-consistency (No Data) Pill Reduced cricopharyngeal relaxation;Esophageal backflow into cervical esophagus Cervical Esophageal Comment reduced cricopharyngeal relaxation/backflow into cervical esophagus Elvina Sidle, M.S., CCC-SLP 09/28/2020, 3:00 PM              CUP PACEART REMOTE DEVICE CHECK  Result Date: 09/11/2020 ILR summary report received. Battery status OK. Normal device function. No new symptom, tachy, brady, or pause episodes. No new AF episodes. Monthly summary reports and ROV/PRN. HB  DG ESOPHAGUS W DOUBLE CM (HD)  Result Date: 09/27/2020 CLINICAL DATA:  Dysphagia. Additional history provided: Patient reports difficulty swallowing food and difficulty with food "coming back up." EXAM: ESOPHOGRAM / BARIUM SWALLOW / BARIUM TABLET STUDY TECHNIQUE: Combined double contrast and single contrast examination performed using effervescent crystals, thick barium liquid, and thin barium liquid. The patient was observed with fluoroscopy swallowing a 13 mm barium sulphate tablet. FLUOROSCOPY TIME:  Fluoroscopy Time:  2.2 minutes Radiation Exposure Index (if provided by the fluoroscopic device): 21.30 mGy Number of Acquired Spot Images: None COMPARISON:  Modified barium swallow 12/14/2019 FINDINGS: The patient had difficulty swallowing contrast at a rate to fully distend the esophagus. Within this  limitation, no high-grade fixed esophageal stricture was identified. Moderate intermittent esophageal dysmotility. Small sliding hiatal hernia. Following small-volume aspiration of thick barium, the examination was prematurely terminated. A swallowed 13 mm barium tablet was mildly delayed at the level of the aortic arch with subsequent free passage into the stomach. The ordering provider, PA Alonza Bogus, was contacted at the conclusion of the examination and notified that the patient had aspirated with persistent coughing. It was decided that the patient should be further evaluated either by his primary care physician or in the emergency department. The patient and his grandson elected to be seen in the emergency department and were transferred to the ED following the examination at 11:45 a.m. on 09/27/2020. IMPRESSION: Prematurely terminated examination due to small-volume aspiration of thick barium. A modified barium swallow with speech pathology is recommended for further evaluation. Prior to exam termination, the patient had difficulty swallowing contrast at a rate to fully distend the esophagus. Within this limitation, no high-grade fixed esophageal stricture is identified. A swallowed 13 mm barium tablet was mildly delayed at the level of the aortic arch with subsequent free passage into the stomach Moderate intermittent esophageal dysmotility. Small sliding hiatal hernia. Electronically Signed   By: Kellie Simmering DO   On: 09/27/2020 11:52      Subjective: Patient seen and examined bedside, resting comfortably.  No complaints this morning.  Ready for discharge home.  Tolerating diet with any further coughing episodes.  Updated patient spouse via telephone.  Denies headache, no visual changes, no chest pain, palpitations, no shortness of breath, no abdominal pain, no weakness, no fatigue, no paresthesias.  No acute events overnight per nursing staff.  Discharge Exam: Vitals:   09/28/20 2056 09/29/20  0540  BP: (!) 151/78 (!) 154/83  Pulse: 79 83  Resp: 17 18  Temp: 98.2 F (36.8 C) 98.1 F (36.7 C)  SpO2: 98% 96%   Vitals:   09/28/20 1407 09/28/20 1451 09/28/20 2056 09/29/20 0540  BP: (!) 180/82  (!) 151/78 (!) 154/83  Pulse: 68  79 83  Resp: 16  17 18   Temp: 97.9 F (36.6 C)  98.2 F (36.8 C) 98.1 F (36.7 C)  TempSrc: Oral     SpO2: 100% 98% 98% 96%  Weight:      Height:        General: Pt is alert, awake, not in acute distress, elderly/thin/cachectic in appearance HEENT: Subconjunctival hemorrhage noted to left eye Cardiovascular: RRR, S1/S2 +, no rubs, no gallops Respiratory: CTA bilaterally, no wheezing, no rhonchi Abdominal: Soft, NT, ND, bowel sounds + Extremities: no edema, no cyanosis    The results of significant diagnostics from this hospitalization (including imaging, microbiology, ancillary and laboratory) are listed below for reference.     Microbiology: Recent Results (from the past 240 hour(s))  Resp Panel by RT-PCR (Flu A&B, Covid) Nasopharyngeal Swab     Status: None   Collection Time: 09/27/20  3:33 PM   Specimen: Nasopharyngeal Swab; Nasopharyngeal(NP) swabs in vial transport medium  Result Value Ref Range Status   SARS Coronavirus 2 by RT PCR NEGATIVE NEGATIVE Final    Comment: (NOTE) SARS-CoV-2 target nucleic acids are NOT DETECTED.  The SARS-CoV-2 RNA is generally detectable in upper respiratory specimens during the acute phase of infection. The lowest concentration of SARS-CoV-2 viral copies this assay can detect is 138 copies/mL. A negative result does not preclude SARS-Cov-2 infection and should not be used as the sole basis for treatment or other patient management decisions. A negative result may occur with  improper specimen collection/handling, submission of specimen other than nasopharyngeal swab, presence of viral mutation(s) within the areas targeted by this assay, and inadequate number of viral copies(<138 copies/mL). A  negative result must be combined with clinical observations, patient history, and epidemiological information. The expected result is Negative.  Fact Sheet for Patients:  EntrepreneurPulse.com.au  Fact Sheet for Healthcare Providers:  IncredibleEmployment.be  This test is no t yet approved or cleared by the [place] FDA and  has been authorized for detection and/or diagnosis of SARS-CoV-2 by FDA under an Emergency Use Authorization (EUA). This EUA will remain  in effect (meaning this test can be used) for the duration of the COVID-19 declaration under Section 564(b)(1) of the Act, 21 U.S.C.section 360bbb-3(b)(1), unless the authorization is terminated  or revoked sooner.       Influenza A by PCR NEGATIVE NEGATIVE Final   Influenza B by PCR NEGATIVE NEGATIVE Final    Comment: (NOTE) The Xpert Xpress SARS-CoV-2/FLU/RSV plus assay is intended as an aid in the diagnosis of influenza from Nasopharyngeal swab specimens and should not be used as a sole basis for treatment. Nasal washings and aspirates are unacceptable for Xpert Xpress SARS-CoV-2/FLU/RSV testing.  Fact Sheet for Patients: EntrepreneurPulse.com.au  Fact Sheet for Healthcare Providers: IncredibleEmployment.be  This test is not yet approved or cleared by the [place] FDA and has been authorized for detection and/or diagnosis of SARS-CoV-2 by FDA under an Emergency Use Authorization (EUA).  This EUA will remain in effect (meaning this test can be used) for the duration of the COVID-19 declaration under Section 564(b)(1) of the Act, 21 U.S.C. section 360bbb-3(b)(1), unless the authorization is terminated or revoked.  Performed at [facility], [place] [address], [place], [place] [address]      Labs: BNP (last 3 results) No results for input(s): BNP in the last 8760 hours. Basic Metabolic Panel: Recent Labs  Lab  09/27/20 1533 09/28/20 0345 09/29/20 0312  NA 139 141 139  K 4.2 4.2 4.0  CL 108 109 107  CO2 23 27 26   GLUCOSE 127* 89 78  BUN 43* 40* 41*  CREATININE 1.49* 1.62* 1.64*  CALCIUM 8.5* 8.3* 8.1*   Liver Function Tests: No results for input(s): AST, ALT, ALKPHOS, BILITOT, PROT, ALBUMIN in the last 168 hours. No results for input(s): LIPASE, AMYLASE in the last 168 hours. No results for input(s): AMMONIA in the last 168 hours. CBC: Recent Labs  Lab 09/27/20 1533 09/28/20 0345 09/29/20 0312  WBC 2.5* 2.2* 1.8*  NEUTROABS 1.8  --   --   HGB 8.4* 7.8* 7.6*  HCT 26.3* 23.7* 23.1*  MCV 95.3 94.4 94.3  PLT 107* 103* 103*   Cardiac Enzymes: No results for input(s): CKTOTAL, CKMB, CKMBINDEX, TROPONINI in the last 168 hours. BNP: Invalid input(s): POCBNP CBG: No results for input(s): GLUCAP in the last 168 hours. D-Dimer No results for input(s): DDIMER in the last 72 hours. Hgb A1c No results for input(s): HGBA1C in the last 72 hours. Lipid Profile No results for input(s): CHOL, HDL, LDLCALC, TRIG, CHOLHDL, LDLDIRECT in the last 72 hours. Thyroid function studies No results for input(s): TSH, T4TOTAL, T3FREE, THYROIDAB in the last 72 hours.  Invalid input(s): FREET3 Anemia work up No results for input(s): VITAMINB12, FOLATE, FERRITIN, TIBC, IRON, RETICCTPCT in the last 72 hours. Urinalysis    Component Value Date/Time   COLORURINE YELLOW 06/22/2019 1742   APPEARANCEUR CLEAR 06/22/2019 1742   LABSPEC 1.013 06/22/2019 1742   LABSPEC 1.020 09/17/2013 1036   PHURINE 5.0 06/22/2019 1742   GLUCOSEU NEGATIVE 06/22/2019 1742   HGBUR SMALL (A) 06/22/2019 1742   BILIRUBINUR NEGATIVE 06/22/2019 1742   KETONESUR NEGATIVE 06/22/2019 1742   PROTEINUR 100 (A) 06/22/2019 1742   UROBILINOGEN 0.2 09/17/2013 1036   NITRITE NEGATIVE 06/22/2019 1742   LEUKOCYTESUR NEGATIVE 06/22/2019 1742   Sepsis Labs Invalid input(s): PROCALCITONIN,  WBC,  LACTICIDVEN Microbiology Recent Results  (from the past 240 hour(s))  Resp Panel by RT-PCR (Flu A&B, Covid) Nasopharyngeal Swab     Status: None   Collection Time: 09/27/20  3:33 PM   Specimen: Nasopharyngeal Swab; Nasopharyngeal(NP) swabs in vial transport medium  Result Value Ref Range Status   SARS Coronavirus 2 by RT PCR NEGATIVE NEGATIVE Final    Comment: (NOTE) SARS-CoV-2 target nucleic acids are NOT DETECTED.  The SARS-CoV-2 RNA is generally detectable in upper respiratory specimens during the acute phase of infection. The lowest concentration of SARS-CoV-2 viral copies this assay can detect is 138 copies/mL. A negative result does not preclude SARS-Cov-2 infection and should not be used as the sole basis for treatment or other patient management decisions. A negative result may occur with  improper specimen collection/handling, submission of specimen other than nasopharyngeal swab, presence of viral mutation(s) within the areas targeted by this assay, and inadequate number of viral copies(<138 copies/mL). A negative result must be combined with clinical observations, patient history, and epidemiological information. The expected result is Negative.  Fact Sheet for Patients:  EntrepreneurPulse.com.au  Fact Sheet for Healthcare Providers:  IncredibleEmployment.be  This test is no t yet approved or cleared by the [place] FDA and  has been authorized for detection and/or diagnosis of SARS-CoV-2 by FDA under an Emergency Use Authorization (EUA). This EUA will remain  in effect (meaning this test can be used) for the duration of the COVID-19 declaration under Section 564(b)(1) of the Act, 21 U.S.C.section 360bbb-3(b)(1), unless the authorization is terminated  or revoked sooner.       Influenza A by PCR NEGATIVE NEGATIVE Final   Influenza B by PCR NEGATIVE NEGATIVE Final    Comment: (NOTE) The Xpert Xpress SARS-CoV-2/FLU/RSV plus assay is intended as an aid in the  diagnosis of influenza from Nasopharyngeal swab specimens and should not be used as a sole basis for treatment. Nasal washings and aspirates are unacceptable for Xpert Xpress SARS-CoV-2/FLU/RSV testing.  Fact Sheet for Patients: EntrepreneurPulse.com.au  Fact Sheet for Healthcare Providers: IncredibleEmployment.be  This test is not yet approved or cleared by the [place] FDA and has been authorized for detection and/or diagnosis of SARS-CoV-2 by FDA under an Emergency Use Authorization (EUA). This EUA will remain in effect (meaning this test can be used) for the duration of the COVID-19 declaration under Section 564(b)(1) of the Act, 21 U.S.C. section 360bbb-3(b)(1), unless the authorization is terminated or revoked.  Performed at [facility], [place] [address] [place], [place] [address]      Time coordinating discharge: Over 30 minutes  SIGNED:   Eric J [place] DO  [facility] 09/29/2020, 10:52 AM

## 2020-09-29 NOTE — TOC Transition Note (Signed)
Transition of Care [facility]) - CM/SW Discharge Note   Patient Details  Name: Christian Burns MRN: [id number] Date of Birth: [birth year]  Transition of Care [facility]) CM/SW Contact:  Lia Hopping, [place] Phone Number: 09/29/2020, 12:24 PM   Clinical Narrative:    No supplement oxygen needed on discharge.  CSW followed up with the patient to discuss physician  recommendation for HHPT. Patient is agreeable to HHPT. [facility]  is active with [facility] for physical therapy needs. CSW reached out to the [facility] and faxed order to [phone].  CSW updated the patient son [name].   Final next level of care: Home/Self Care (Independent Living at [facility]) Barriers to Discharge: Barriers Resolved   Patient Goals and CMS Choice Patient states their goals for this hospitalization and ongoing recovery are:: "just stay alive"      Discharge Placement                Patient to be transferred to facility by: Son [name] private vehicle Name of family member notified: Lillie Fragmin Patient and family notified of of transfer: 09/29/20  Discharge Plan and Services In-house Referral: Clinical Social Work   Post Acute Care Choice: Durable Medical Equipment                    HH Arranged: PT [place] Agency: Other - See comment ([facility] Date HH Agency Contacted: 09/29/20 Time [place]: 1221 Representative spoke with at [place]: [place] ([place]) Interventions     Readmission Risk Interventions Readmission Risk Prevention Plan 10/03/2019  Transportation Screening Complete  HRI or Home Care Consult Complete  SW Recovery Care/Counseling Consult Complete  Skilled Nursing Facility Complete  Some recent data might be hidden

## 2020-09-29 NOTE — Plan of Care (Signed)
Plan of care reviewed with the patient. 

## 2020-09-29 NOTE — Progress Notes (Signed)
PT Cancellation Note  Patient Details Name: Christian Burns MRN: [id number] DOB: 09-17-1929   Cancelled Treatment:    Reason Eval/Treat Not Completed: PT screened, no needs identified, will sign off.  Pt reports independence/mod I with RW, Independent with ADLs; NT confirms   [facility] 09/29/2020, 12:32 PM

## 2020-09-29 NOTE — Progress Notes (Signed)
  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  We reviewed yesterday's MBS, looked at images and discussed the backflow of materials that can put him at risk for post-prandial aspiration, as well as esophageal retention and potential aspiration when drinking large volumes of thin liquid. We discussed strategies - small sips/bites, alternating solids/liquids, elevating HOB 4-6" at home, avoiding POs two hours before bedtime,eating smaller/more frequent meals and starting with foods that are nutrient-dense given early satiety. Suggestions written down and provided to pt. Observed drinking liquids with no obvious difficulty. Mr. Maynez reports no trouble with breakfast.  He is for D/C home today.   HPI HPI: 87 y.o. 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  Patient was seen at GI Dr. Vivia Ewing office on 2/9 for dysphagia.  He complained of food getting stuck in his throat, having hard time swallowing and taking long time to eat.  He had weight loss as well.  He was ordered for an esophagogram to rule out esophageal issues.  Patient presented as an outpatient to radiology today to get it done.  During the procedure, he aspirated and started coughing.  His oxygen level dropped and hence he was sent to the ED.      SLP Plan  All goals met       Recommendations  Diet recommendations: Regular;Thin liquid Liquids provided via: Cup;Straw Medication  Administration: Crushed with puree (if large) Supervision: Patient able to self feed Compensations: Effortful swallow;Follow solids with liquid Postural Changes and/or Swallow Maneuvers: Upright 30-60 min after meal                Oral Care Recommendations: Oral care BID Follow up Recommendations: None SLP Visit Diagnosis: Dysphagia, pharyngoesophageal phase (R13.14) Plan: All goals met       GO                Juan Quam Laurice 09/29/2020, 11:35 AM Estill Bamberg L. Tivis Ringer, [place] Office number [phone] Pager [phone]

## 2020-10-02 ENCOUNTER — Telehealth: Payer: Self-pay

## 2020-10-02 NOTE — Telephone Encounter (Signed)
Pt called in to r/s his missed appts as he has been in the hosp    Christian Burns

## 2020-10-09 ENCOUNTER — Ambulatory Visit: Payer: Medicare HMO | Admitting: Internal Medicine

## 2020-10-09 ENCOUNTER — Encounter: Payer: Self-pay | Admitting: Internal Medicine

## 2020-10-09 ENCOUNTER — Other Ambulatory Visit: Payer: Self-pay

## 2020-10-09 VITALS — BP 162/90 | HR 83 | Temp 97.5°F | Wt 126.0 lb

## 2020-10-09 DIAGNOSIS — M272 Inflammatory conditions of jaws: Secondary | ICD-10-CM

## 2020-10-11 NOTE — Progress Notes (Signed)
RFV: follow up for chronic jaw osteomyelitis  Patient ID: Christian Burns, male   DOB: 04/11/1930, 87 y.o.   MRN: [id number]  HPI 36yoM with hx of myeloma, radiation jaw osteonecrosis/osteomyelitis, on chronic abtx suppression with penicillin. The patient reports that he injured his eye while sleeping a few weeks back where he had ecchymosis of lower eyelid, conjunctiva hemorrhage to left eye but did not have any vision impairment. He reports tolerating abtx. No changes in his jaw/teeth does have upcoming moh's surgery on his nose.  He is here with his grandson  Outpatient Encounter Medications as of 10/09/2020  Medication Sig  . amLODipine-benazepril (LOTREL) 5-10 MG capsule TAKE ONE CAPSULE EACH DAY (Patient taking differently: Take 1 capsule by mouth daily.)  . aspirin EC 81 MG tablet Take 1 tablet (81 mg total) by mouth daily. Swallow whole.  . dronabinol (MARINOL) 5 MG capsule TAKE ONE CAPSULE TWICE A DAY BEFORE A MEAL  . DULoxetine (CYMBALTA) 60 MG capsule TAKE ONE CAPSULE EACH DAY  . finasteride (PROSCAR) 5 MG tablet TAKE ONE TABLET EACH DAY (Patient taking differently: Take 5 mg by mouth daily.)  . lidocaine-prilocaine (EMLA) cream Apply 1 application topically as needed. (Patient taking differently: Apply 1 application topically daily as needed (access port).)  . loperamide (IMODIUM A-D) 2 MG tablet Take 2 mg by mouth daily.  . metoprolol succinate (TOPROL-XL) 25 MG 24 hr tablet Take 25 mg by mouth daily.  . Multiple Vitamins-Minerals (QC MULTI-VITE 50 & OVER PO) TAKE ONE TABLET DAILY  . ondansetron (ZOFRAN) 4 MG tablet Take 1 tablet (4 mg total) by mouth every 6 (six) hours as needed for nausea.  . pantoprazole (PROTONIX) 40 MG tablet Take 1 tablet (40 mg total) by mouth 2 (two) times daily. (Patient taking differently: Take 40 mg by mouth daily.)  . penicillin v potassium (VEETID) 500 MG tablet TAKE ONE TABLET FOUR TIMES DAILY  . polyvinyl alcohol (LIQUIFILM TEARS) 1.4 %  ophthalmic solution Place 1 drop into both eyes as needed for dry eyes.  . predniSONE (DELTASONE) 20 MG tablet TAKE ONE TABLET EACH DAY WITH BREAKFAST  . Probiotic Product (PROBIOTIC PO) Take 1 capsule by mouth daily.  Marland Kitchen pyridOXINE (VITAMIN B-6) 100 MG tablet Take 100 mg by mouth daily.   . traMADol (ULTRAM) 50 MG tablet Take 1 tablet (50 mg total) by mouth every 8 (eight) hours as needed for moderate pain.  . vitamin B-12 (CYANOCOBALAMIN) 500 MCG tablet Take 500 mcg by mouth daily.  . Vitamin D, Ergocalciferol, (DRISDOL) 50000 UNITS CAPS capsule Take 50,000 Units by mouth every Sunday.   Marland Kitchen White Petrolatum-Mineral Oil (EYE LUBRICANT) OINT Apply 1 application to eye in the morning and at bedtime. If left eye lower eye lid is irritated   No facility-administered encounter medications on file as of 10/09/2020.     Patient Active Problem List   Diagnosis Date Noted  . Aspiration pneumonitis ([place]) 09/28/2020  . Pancytopenia ([place]) 09/28/2020  . Dysphagia 09/20/2020  . SIRS (systemic inflammatory response syndrome) ([place]) 09/28/2019  . Multifocal pneumonia 09/28/2019  . Anemia associated with chemotherapy 09/28/2019  . Protein-calorie malnutrition, severe 06/24/2019  . Leukopenia due to antineoplastic chemotherapy ([place]) 06/23/2019  . Nausea 06/23/2019  . Diarrhea 06/23/2019  . CKD (chronic kidney disease), stage III ([place]) 06/23/2019  . HCAP (healthcare-associated pneumonia) 06/22/2019  . Hyperlipidemia LDL goal <70 04/20/2019  . Essential hypertension 04/20/2019  . Advanced age 08/19/2018  . Stroke ([place]) 04/19/2019  . Community acquired  pneumonia 03/03/2019  . AKI (acute kidney injury) ([place]) 03/03/2019  . Normochromic normocytic anemia 03/03/2019  . Goals of care, counseling/discussion 02/04/2019  . Acute osteomyelitis of jaw 11/26/2018  . Bacteremia   . 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 Sepsis due to undetermined organism ([place])   . Left lower lobe pneumonia  10/06/2018  . Right hip pain 09/01/2018  . Iron deficiency anemia secondary to inadequate dietary iron intake 07/18/2016  . Multiple myeloma in relapse ([place]) 04/05/2016  . 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 Multiple myeloma in remission ([place]) 09/08/2015  . Myeloma ([place]) 08/23/2011     Health Maintenance Due  Topic Date Due  . COVID-19 Vaccine (1) Never done  . TETANUS/TDAP  Never done  . PNA vac Low Risk Adult (1 of 2 - PCV13) Never done     Review of Systems 12 point ros is negative except what is mentioned above Physical Exam   BP (!) 162/90   Pulse 83   Temp (!) 97.5 F (36.4 C) (Oral)   Wt 126 lb (57.2 kg)   BMI 18.61 kg/m   gen = a xo by 3 in nad, HEENT = 1/4 of scleral conjunctiva hemorrhage of left eye, healing ecchymosis to lower lid CBC Lab Results  Component Value Date   WBC 1.8 (L) 09/29/2020   RBC 2.45 (L) 09/29/2020   HGB 7.6 (L) 09/29/2020   HCT 23.1 (L) 09/29/2020   PLT 103 (L) 09/29/2020   MCV 94.3 09/29/2020   MCH 31.0 09/29/2020   MCHC 32.9 09/29/2020   RDW 16.6 (H) 09/29/2020   LYMPHSABS 0.1 (L) 09/27/2020   MONOABS 0.5 09/27/2020   EOSABS 0.0 09/27/2020    BMET Lab Results  Component Value Date   NA 139 09/29/2020   K 4.0 09/29/2020   CL 107 09/29/2020   CO2 26 09/29/2020   GLUCOSE 78 09/29/2020   BUN 41 (H) 09/29/2020   CREATININE 1.64 (H) 09/29/2020   CALCIUM 8.1 (L) 09/29/2020   GFRNONAA 40 (L) 09/29/2020   GFRAA 45 (L) 05/03/2020   Lab Results  Component Value Date   ESRSEDRATE 50 (H) 06/08/2020   Lab Results  Component Value Date   CRP 0.8 06/08/2020      Assessment and Plan  Chronic jaw osteomyelitis = continue on penicillin suppression, see back in 3-4 months  ckd 3 = stable

## 2020-10-12 ENCOUNTER — Other Ambulatory Visit: Payer: Self-pay | Admitting: *Deleted

## 2020-10-12 ENCOUNTER — Inpatient Hospital Stay: Payer: Medicare HMO | Admitting: Hematology & Oncology

## 2020-10-12 ENCOUNTER — Inpatient Hospital Stay: Payer: Medicare HMO

## 2020-10-12 ENCOUNTER — Inpatient Hospital Stay: Payer: Medicare HMO | Attending: Hematology & Oncology

## 2020-10-12 ENCOUNTER — Other Ambulatory Visit: Payer: Self-pay

## 2020-10-12 VITALS — BP 125/61 | HR 69 | Temp 98.2°F | Resp 20

## 2020-10-12 VITALS — BP 133/69 | HR 78 | Temp 97.8°F | Resp 18 | Wt 128.4 lb

## 2020-10-12 DIAGNOSIS — G629 Polyneuropathy, unspecified: Secondary | ICD-10-CM | POA: Diagnosis not present

## 2020-10-12 DIAGNOSIS — M8660 Other chronic osteomyelitis, unspecified site: Secondary | ICD-10-CM | POA: Diagnosis not present

## 2020-10-12 DIAGNOSIS — D649 Anemia, unspecified: Secondary | ICD-10-CM

## 2020-10-12 DIAGNOSIS — R1310 Dysphagia, unspecified: Secondary | ICD-10-CM | POA: Insufficient documentation

## 2020-10-12 DIAGNOSIS — C9000 Multiple myeloma not having achieved remission: Secondary | ICD-10-CM

## 2020-10-12 DIAGNOSIS — D508 Other iron deficiency anemias: Secondary | ICD-10-CM

## 2020-10-12 DIAGNOSIS — R5383 Other fatigue: Secondary | ICD-10-CM | POA: Diagnosis not present

## 2020-10-12 DIAGNOSIS — C9002 Multiple myeloma in relapse: Secondary | ICD-10-CM

## 2020-10-12 DIAGNOSIS — D631 Anemia in chronic kidney disease: Secondary | ICD-10-CM | POA: Diagnosis not present

## 2020-10-12 DIAGNOSIS — Z5111 Encounter for antineoplastic chemotherapy: Secondary | ICD-10-CM | POA: Diagnosis not present

## 2020-10-12 DIAGNOSIS — R197 Diarrhea, unspecified: Secondary | ICD-10-CM | POA: Diagnosis not present

## 2020-10-12 DIAGNOSIS — Z923 Personal history of irradiation: Secondary | ICD-10-CM | POA: Insufficient documentation

## 2020-10-12 DIAGNOSIS — C9001 Multiple myeloma in remission: Secondary | ICD-10-CM | POA: Diagnosis not present

## 2020-10-12 DIAGNOSIS — N183 Chronic kidney disease, stage 3 unspecified: Secondary | ICD-10-CM | POA: Insufficient documentation

## 2020-10-12 DIAGNOSIS — Z95828 Presence of other vascular implants and grafts: Secondary | ICD-10-CM

## 2020-10-12 DIAGNOSIS — M2550 Pain in unspecified joint: Secondary | ICD-10-CM | POA: Insufficient documentation

## 2020-10-12 DIAGNOSIS — Z79899 Other long term (current) drug therapy: Secondary | ICD-10-CM | POA: Insufficient documentation

## 2020-10-12 DIAGNOSIS — M791 Myalgia, unspecified site: Secondary | ICD-10-CM | POA: Insufficient documentation

## 2020-10-12 DIAGNOSIS — Z882 Allergy status to sulfonamides status: Secondary | ICD-10-CM | POA: Diagnosis not present

## 2020-10-12 LAB — CMP (CANCER CENTER ONLY)
ALT: 12 U/L (ref 0–44)
AST: 26 U/L (ref 15–41)
Albumin: 3.4 g/dL — ABNORMAL LOW (ref 3.5–5.0)
Alkaline Phosphatase: 41 U/L (ref 38–126)
Anion gap: 8 (ref 5–15)
BUN: 57 mg/dL — ABNORMAL HIGH (ref 8–23)
CO2: 26 mmol/L (ref 22–32)
Calcium: 9.2 mg/dL (ref 8.9–10.3)
Chloride: 104 mmol/L (ref 98–111)
Creatinine: 1.74 mg/dL — ABNORMAL HIGH (ref 0.61–1.24)
GFR, Estimated: 37 mL/min — ABNORMAL LOW (ref >60)
Glucose, Bld: 109 mg/dL — ABNORMAL HIGH (ref 70–99)
Potassium: 4.4 mmol/L (ref 3.5–5.1)
Sodium: 138 mmol/L (ref 135–145)
Total Bilirubin: 0.5 mg/dL (ref 0.3–1.2)
Total Protein: 5.7 g/dL — ABNORMAL LOW (ref 6.5–8.1)

## 2020-10-12 LAB — CBC WITH DIFFERENTIAL (CANCER CENTER ONLY)
Abs Immature Granulocytes: 0.4 10*3/uL — ABNORMAL HIGH (ref 0.00–0.07)
Band Neutrophils: 11 %
Basophils Absolute: 0 10*3/uL (ref 0.0–0.1)
Basophils Relative: 1 %
Eosinophils Absolute: 0 10*3/uL (ref 0.0–0.5)
Eosinophils Relative: 2 %
HCT: 23.9 % — ABNORMAL LOW (ref 39.0–52.0)
Hemoglobin: 8.1 g/dL — ABNORMAL LOW (ref 13.0–17.0)
Lymphocytes Relative: 3 %
Lymphs Abs: 0.1 10*3/uL — ABNORMAL LOW (ref 0.7–4.0)
MCH: 31.3 pg (ref 26.0–34.0)
MCHC: 33.9 g/dL (ref 30.0–36.0)
MCV: 92.3 fL (ref 80.0–100.0)
Metamyelocytes Relative: 10 %
Monocytes Absolute: 0.4 10*3/uL (ref 0.1–1.0)
Monocytes Relative: 17 %
Myelocytes: 5 %
Neutro Abs: 1.5 10*3/uL — ABNORMAL LOW (ref 1.7–7.7)
Neutrophils Relative %: 50 %
Platelet Count: 135 10*3/uL — ABNORMAL LOW (ref 150–400)
Promyelocytes Relative: 1 %
RBC: 2.59 MIL/uL — ABNORMAL LOW (ref 4.22–5.81)
RDW: 17.3 % — ABNORMAL HIGH (ref 11.5–15.5)
WBC Count: 2.4 10*3/uL — ABNORMAL LOW (ref 4.0–10.5)
nRBC: 0 % (ref 0.0–0.2)

## 2020-10-12 LAB — RETICULOCYTES
Immature Retic Fract: 9.4 % (ref 2.3–15.9)
RBC.: 2.61 MIL/uL — ABNORMAL LOW (ref 4.22–5.81)
Retic Count, Absolute: 39.7 10*3/uL (ref 19.0–186.0)
Retic Ct Pct: 1.5 % (ref 0.4–3.1)

## 2020-10-12 LAB — IRON AND TIBC
Iron: 82 ug/dL (ref 42–163)
Saturation Ratios: 36 % (ref 20–55)
TIBC: 231 ug/dL (ref 202–409)
UIBC: 149 ug/dL (ref 117–376)

## 2020-10-12 LAB — FERRITIN: Ferritin: 638 ng/mL — ABNORMAL HIGH (ref 24–336)

## 2020-10-12 LAB — LACTATE DEHYDROGENASE: LDH: 328 U/L — ABNORMAL HIGH (ref 98–192)

## 2020-10-12 LAB — PREPARE RBC (CROSSMATCH)

## 2020-10-12 MED ORDER — HEPARIN SOD (PORK) LOCK FLUSH 100 UNIT/ML IV SOLN
500.0000 [IU] | Freq: Once | INTRAVENOUS | Status: AC | PRN
  Administered 2020-10-12: 500 [IU]
  Filled 2020-10-12: qty 5

## 2020-10-12 MED ORDER — EPOETIN ALFA-EPBX 40000 UNIT/ML IJ SOLN
INTRAMUSCULAR | Status: AC
  Filled 2020-10-12: qty 1

## 2020-10-12 MED ORDER — SODIUM CHLORIDE 0.9 % IV SOLN
Freq: Once | INTRAVENOUS | Status: DC
  Filled 2020-10-12: qty 250

## 2020-10-12 MED ORDER — EPOETIN ALFA-EPBX 40000 UNIT/ML IJ SOLN
40000.0000 [IU] | Freq: Once | INTRAMUSCULAR | Status: AC
  Administered 2020-10-12: 40000 [IU] via SUBCUTANEOUS

## 2020-10-12 MED ORDER — ACETAMINOPHEN 325 MG PO TABS
650.0000 mg | ORAL_TABLET | Freq: Once | ORAL | Status: AC
  Administered 2020-10-12: 650 mg via ORAL

## 2020-10-12 MED ORDER — IMMUNE GLOBULIN (HUMAN) 20 GM/200ML IV SOLN
40.0000 g | Freq: Once | INTRAVENOUS | Status: AC
  Administered 2020-10-12: 40 g via INTRAVENOUS
  Filled 2020-10-12: qty 400

## 2020-10-12 MED ORDER — SODIUM CHLORIDE 0.9% FLUSH
10.0000 mL | Freq: Once | INTRAVENOUS | Status: AC
  Administered 2020-10-12: 10 mL via INTRAVENOUS
  Filled 2020-10-12: qty 10

## 2020-10-12 MED ORDER — DIPHENHYDRAMINE HCL 25 MG PO CAPS
25.0000 mg | ORAL_CAPSULE | Freq: Once | ORAL | Status: AC
  Administered 2020-10-12: 25 mg via ORAL

## 2020-10-12 MED ORDER — PALONOSETRON HCL INJECTION 0.25 MG/5ML
INTRAVENOUS | Status: AC
  Filled 2020-10-12: qty 5

## 2020-10-12 MED ORDER — PALONOSETRON HCL INJECTION 0.25 MG/5ML
0.2500 mg | Freq: Once | INTRAVENOUS | Status: AC
  Administered 2020-10-12: 0.25 mg via INTRAVENOUS

## 2020-10-12 MED ORDER — HEPARIN SOD (PORK) LOCK FLUSH 10 UNIT/ML IV SOLN
10.0000 [IU] | Freq: Once | INTRAVENOUS | Status: DC
Start: 2020-10-12 — End: 2020-10-12

## 2020-10-12 MED ORDER — DEXTROSE 5 % IV SOLN
INTRAVENOUS | Status: DC
  Filled 2020-10-12: qty 250

## 2020-10-12 MED ORDER — SODIUM CHLORIDE 0.9 % IV SOLN
10.0000 mg | Freq: Once | INTRAVENOUS | Status: AC
  Administered 2020-10-12: 10 mg via INTRAVENOUS
  Filled 2020-10-12: qty 10

## 2020-10-12 MED ORDER — DIPHENHYDRAMINE HCL 25 MG PO CAPS
ORAL_CAPSULE | ORAL | Status: AC
  Filled 2020-10-12: qty 1

## 2020-10-12 MED ORDER — SODIUM CHLORIDE 0.9 % IV SOLN
75.0000 mg/m2 | Freq: Once | INTRAVENOUS | Status: AC
  Administered 2020-10-12: 125 mg via INTRAVENOUS
  Filled 2020-10-12: qty 5

## 2020-10-12 MED ORDER — SODIUM CHLORIDE 0.9% FLUSH
10.0000 mL | INTRAVENOUS | Status: DC | PRN
  Administered 2020-10-12: 10 mL
  Filled 2020-10-12: qty 10

## 2020-10-12 MED ORDER — ACETAMINOPHEN 325 MG PO TABS
ORAL_TABLET | ORAL | Status: AC
  Filled 2020-10-12: qty 2

## 2020-10-12 NOTE — Progress Notes (Signed)
Hematology and Oncology Follow Up Visit  Christian Burns [id number] 12/07/1929 87 y.o. 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 Palliative radiation therapy to right ilium Kyprolis/Cytoxanq 3 week dosing- s/p cycle 24 (Cytoxan restarted on cycle 13) - DC'd due to progression Radiation therapy to right femur, 10 fraction --completed 3000 rad on 11/06/2018 Elotuzomab - started 10/29/2018 -- s/p cycle #1 -- d/c on 12/24/2018 Pomalidomide 2 mg po q day (21 on/7 off) - started 10/29/2018 -- d/c 12/31/2018 Daratumumab - start cycle # 1 on 12/31/2018 -- d/c on -02/04/2019  Current Therapy: Bendamustine/Decadron -- started on 02/10/2019, s/p cycle16-- Bendamustine given just 1 day Zometa IV q 4 weeks - permanently discontinued due to chronic osteomyelitis of the left jaw Retacritneeded for hemoglobin less than 10 IVIG 40g IV q month -- startedon 07/2019   Interim History:  Christian Burns is here today for a long awaited follow-up.  Is been about 6 weeks since he was last here.  I think he was in the hospital back in February.  He looks pretty good right now.  The problem is that he still does not have much of an appetite.  His weight is 128 pounds.  No matter what we try, he just does not eat much.  When we last saw him and checked his myeloma studies, the light chains, which is what we follow, were on the higher side.  His lambda light chain was 3.2 mg/dL.  This was up from 1.6 mg/dL.  His monoclonal spike was stable at 0.2.  He is still quite anemic.  I think we do do give him some Retacrit.  This might help.  I do think he needs to be transfused.  His hemoglobin is 8.1.  Given his age and his overall  performance status, I think 1 unit of blood would make a difference.  We do give him IVIG.  I think this is quite helpful to try to minimize infection risk.  He still has the osteomyelitis with the jaw.  As such we do not give him any bisphosphonate or Xgeva.  Overall, I would say his performance status is by ECOG 2.    Medications:  Allergies as of 10/12/2020      Reactions   Sulfa Antibiotics Itching   Sulfasalazine Itching      Medication List       Accurate as of October 12, 2020 10:28 AM. If you have any questions, ask your nurse or doctor.        STOP taking these medications   QC MULTI-VITE 50 & OVER PO Stopped by: [name], MD   traMADol 50 MG tablet Commonly known as: ULTRAM Stopped by: [name], MD     TAKE these medications   amLODipine-benazepril 5-10 MG capsule Commonly known as: LOTREL TAKE ONE CAPSULE EACH DAY What changed: See the new instructions.   aspirin EC 81 MG tablet Take 1 tablet (81 mg total) by mouth daily. Swallow whole.   dronabinol 5 MG capsule Commonly known as: MARINOL TAKE ONE CAPSULE TWICE A DAY BEFORE A MEAL   DULoxetine 60 MG capsule Commonly known as: CYMBALTA TAKE ONE CAPSULE EACH DAY   Eye Lubricant Oint Apply 1 application to eye in the  morning and at bedtime. If left eye lower eye lid is irritated   finasteride 5 MG tablet Commonly known as: PROSCAR TAKE ONE TABLET EACH DAY What changed: See the new instructions.   lidocaine-prilocaine cream Commonly known as: EMLA Apply 1 application topically as needed.   loperamide 2 MG tablet Commonly known as: IMODIUM A-D Take 2 mg by mouth daily.   metoprolol succinate 25 MG 24 hr tablet Commonly known as: TOPROL-XL Take 25 mg by mouth daily.   ondansetron 4 MG tablet Commonly known as: ZOFRAN Take 1 tablet (4 mg total) by mouth every 6 (six) hours as needed for nausea.   pantoprazole 40 MG tablet Commonly known as: Protonix Take 1 tablet (40 mg total) by  mouth 2 (two) times daily. What changed: when to take this   penicillin v potassium 500 MG tablet Commonly known as: VEETID TAKE ONE TABLET FOUR TIMES DAILY   polyvinyl alcohol 1.4 % ophthalmic solution Commonly known as: LIQUIFILM TEARS Place 1 drop into both eyes as needed for dry eyes.   predniSONE 20 MG tablet Commonly known as: DELTASONE TAKE ONE TABLET EACH DAY WITH BREAKFAST   PROBIOTIC PO Take 1 capsule by mouth daily.   pyridOXINE 100 MG tablet Commonly known as: VITAMIN B-6 Take 100 mg by mouth daily.   vitamin B-12 500 MCG tablet Commonly known as: CYANOCOBALAMIN Take 500 mcg by mouth daily.   Vitamin D (Ergocalciferol) 1.25 MG (50000 UNIT) Caps capsule Commonly known as: DRISDOL Take 50,000 Units by mouth every Sunday.       Allergies:  Allergies  Allergen Reactions  . Sulfa Antibiotics Itching  . Sulfasalazine Itching    Past Medical History, Surgical history, Social history, and Family History were reviewed and updated.  Review of Systems: Review of Systems  Constitutional: Positive for malaise/fatigue and weight loss.  HENT: Negative.   Eyes: Negative.   Respiratory: Negative.   Cardiovascular: Negative.   Gastrointestinal: Negative.   Genitourinary: Negative.   Musculoskeletal: Positive for joint pain and myalgias.  Skin: Negative.   Neurological: Negative.   Endo/Heme/Allergies: Negative.   Psychiatric/Behavioral: Negative.      Physical Exam:  weight is 128 lb 6.4 oz (58.2 kg). His oral temperature is 97.8 F (36.6 C). His blood pressure is 133/69 and his pulse is 78. His respiration is 18 and oxygen saturation is 100%.   Wt Readings from Last 3 Encounters:  10/12/20 128 lb 6.4 oz (58.2 kg)  10/09/20 126 lb (57.2 kg)  09/27/20 125 lb (56.7 kg)    Physical Exam Vitals reviewed.  HENT:     Head: Normocephalic and atraumatic.     Mouth/Throat:     Mouth: Oropharynx is clear and moist.  Eyes:     Extraocular Movements: EOM  normal.     Pupils: Pupils are equal, round, and reactive to light.  Cardiovascular:     Rate and Rhythm: Normal rate and regular rhythm.     Heart sounds: Normal heart sounds.  Pulmonary:     Effort: Pulmonary effort is normal.     Breath sounds: Normal breath sounds.  Abdominal:     General: Bowel sounds are normal.     Palpations: Abdomen is soft.  Musculoskeletal:        General: No tenderness, deformity or edema. Normal range of motion.     Cervical back: Normal range of motion.  Lymphadenopathy:     Cervical: No cervical adenopathy.  Skin:    General: Skin is warm  and dry.     Findings: No erythema or rash.  Neurological:     Mental Status: He is alert and oriented to person, place, and time.  Psychiatric:        Mood and Affect: Mood and affect normal.        Behavior: Behavior normal.        Thought Content: Thought content normal.        Judgment: Judgment normal.      Lab Results  Component Value Date   WBC 2.4 (L) 10/12/2020   HGB 8.1 (L) 10/12/2020   HCT 23.9 (L) 10/12/2020   MCV 92.3 10/12/2020   PLT 135 (L) 10/12/2020   Lab Results  Component Value Date   FERRITIN 730 (H) 08/30/2020   IRON 108 08/30/2020   TIBC 235 08/30/2020   UIBC 127 08/30/2020   IRONPCTSAT 46 08/30/2020   Lab Results  Component Value Date   RETICCTPCT 1.5 10/12/2020   RBC 2.61 (L) 10/12/2020   Lab Results  Component Value Date   KPAFRELGTCHN 19.3 08/30/2020   LAMBDASER 31.7 (H) 08/30/2020   KAPLAMBRATIO 0.61 08/30/2020   Lab Results  Component Value Date   IGGSERUM 875 08/30/2020   IGA 8 (L) 08/30/2020   IGMSERUM 249 (H) 08/30/2020   Lab Results  Component Value Date   TOTALPROTELP 5.6 (L) 08/30/2020   ALBUMINELP 3.0 08/30/2020   A1GS 0.2 08/30/2020   A2GS 0.7 08/30/2020   BETS 0.8 08/30/2020   BETA2SER 0.3 07/28/2015   GAMS 0.9 08/30/2020   MSPIKE 0.2 (H) 08/30/2020   SPEI Comment 08/30/2020     Chemistry      Component Value Date/Time   NA 138  10/12/2020 0909   NA 146 (H) 08/07/2017 1153   NA 141 01/09/2017 1004   K 4.4 10/12/2020 0909   K 4.6 08/07/2017 1153   K 4.4 01/09/2017 1004   CL 104 10/12/2020 0909   CL 108 08/07/2017 1153   CO2 26 10/12/2020 0909   CO2 26 08/07/2017 1153   CO2 22 01/09/2017 1004   BUN 57 (H) 10/12/2020 0909   BUN 24 (H) 08/07/2017 1153   BUN 23.9 01/09/2017 1004   CREATININE 1.74 (H) 10/12/2020 0909   CREATININE 1.56 (H) 05/26/2018 1508   CREATININE 1.5 (H) 01/09/2017 1004      Component Value Date/Time   CALCIUM 9.2 10/12/2020 0909   CALCIUM 8.8 08/07/2017 1153   CALCIUM 8.8 01/09/2017 1004   ALKPHOS 41 10/12/2020 0909   ALKPHOS 97 (H) 08/07/2017 1153   ALKPHOS 80 01/09/2017 1004   AST 26 10/12/2020 0909   AST 18 01/09/2017 1004   ALT 12 10/12/2020 0909   ALT 20 08/07/2017 1153   ALT 12 01/09/2017 1004   BILITOT 0.5 10/12/2020 0909   BILITOT 0.92 01/09/2017 1004       Impression and Plan: Christian Burns is a very pleasant 87 yo caucasian gentleman with relapsed IgG lambda myeloma.  I must say that he has had incredible response duration with the bendamustine.  We will give him bendamustine 1 day.  It will be incredibly interesting to see what his lambda light chain is today.  Regardless, our goal has always been quality of life for him.  It is important for him to be able to take care of his wife who is crippled with arthritis.  I do hope that the blood will help him.  We will do the blood transfusion tomorrow.  We do have to follow  him closely.  I probably will get him back to see him in 2 or 3 weeks.   [name], MD 3/3/202210:28 AM

## 2020-10-12 NOTE — Progress Notes (Signed)
Okay to treat with SCr 1.74 today per Dr. Marin Olp.

## 2020-10-12 NOTE — Patient Instructions (Signed)
Tunneled Central Venous Catheter Flushing Guide  It is important to flush your tunneled central venous catheter each time you use it, both before and after you use it. Flushing your catheter will help prevent it from clogging. What are the risks? Risks may include:  Infection.  Air getting into the catheter and bloodstream. Supplies needed:  A clean pair of gloves.  A disinfecting wipe. Use an alcohol wipe, chlorhexidine wipe, or iodine wipe as told by your health care provider.  A 10 mL syringe that has been prefilled with saline solution.  An empty 10 mL syringe, if a substance called heparin was injected into your catheter. How to flush your catheter When you flush your catheter, make sure you follow any specific instructions from your health care provider or the manufacturer. These are general guidelines. Flushing your catheter before use If there is heparin in your catheter: 1. Wash your hands with soap and water. 2. Put on gloves. 3. Scrub the injection cap for a minimum of 15 seconds with a disinfecting wipe. 4. Unclamp the catheter. 5. Attach the empty syringe to the injection cap. 6. Pull the syringe plunger back and withdraw 10 mL of blood. 7. Place the syringe into an appropriate waste container. 8. Scrub the injection cap for 15 seconds with a disinfecting wipe. 9. Attach the prefilled syringe to the injection cap. 10. Flush the catheter by pushing the plunger forward until all the liquid from the syringe is in the catheter. 11. Remove the syringe from the injection cap. 12. Clamp the catheter. If there is no heparin in your catheter: 1. Wash your hands with soap and water. 2. Put on gloves. 3. Scrub the injection cap for 15 seconds with a disinfecting wipe. 4. Unclamp the catheter. 5. Attach the prefilled syringe to the injection cap. 6. Flush the catheter by pushing the plunger forward until 5 mL of the liquid from the syringe is in the catheter. 7. Pull back on  the syringe until you see blood in the catheter. 8. If you have been asked to collect any blood, follow your health care provider's instructions. Otherwise, flush the catheter with the rest of the solution from the syringe. 9. Remove the syringe from the injection cap. 10. Clamp the catheter.   Flushing your catheter after use 1. Wash your hands with soap and water. 2. Put on gloves. 3. Scrub the injection cap for 15 seconds with a disinfecting wipe. 4. Unclamp the catheter. 5. Attach the prefilled syringe to the injection cap. 6. Flush the catheter by pushing the plunger forward until all of the liquid from the syringe is in the catheter. 7. Remove the syringe from the injection cap. 8. Clamp the catheter. Problems and solutions  If blood cannot be completely cleared from the injection cap, you may need to have the injection cap replaced.  If the catheter is difficult to flush, use the pulsing method. The pulsing method involves pushing only a few milliliters of solution into the catheter at a time and pausing between pushes.  If you do not see blood in the catheter when you pull back on the syringe, change your body position, such as by raising your arms above your head. Take a deep breath and cough. Then, pull back on the syringe. If you still do not see blood, flush the catheter with a small amount of solution. Then, change positions again and take a breath or cough. Pull back on the syringe again. If you still do not   see blood, finish flushing the catheter and contact your health care provider. Do not use your catheter until your health care provider says it is okay. General tips  Have someone help you flush your catheter, if possible.  Do not force fluid through your catheter.  Do not use a syringe that is larger or smaller than 10 mL. Using a smaller syringe can make the catheter burst.  Do not use your catheter without flushing it first if it has heparin in it. Contact a health  care provider if:  You cannot see any blood in the catheter when you flush it before using it.  Your catheter is difficult to flush. Get help right away if:  You cannot flush the catheter.  The catheter leaks when you flush it or when there is fluid in it.  There are cracks or breaks in the catheter. Summary  It is important to flush your tunneled central venous catheter each time you use it, both before and after you use it.  Scrub the injection cap for 15 seconds with a disinfecting wipe before and after you flush it.  When you flush your catheter, make sure you follow any specific instructions from your health care provider or the manufacturer.  Get help right away if you cannot flush the catheter. This information is not intended to replace advice given to you by your health care provider. Make sure you discuss any questions you have with your health care provider. Document Revised: 10/07/2019 Document Reviewed: 10/14/2018 Elsevier Patient Education  2021 Elsevier Inc.  

## 2020-10-13 ENCOUNTER — Telehealth: Payer: Self-pay | Admitting: *Deleted

## 2020-10-13 ENCOUNTER — Inpatient Hospital Stay: Payer: Medicare HMO

## 2020-10-13 DIAGNOSIS — D649 Anemia, unspecified: Secondary | ICD-10-CM

## 2020-10-13 DIAGNOSIS — Z5111 Encounter for antineoplastic chemotherapy: Secondary | ICD-10-CM | POA: Diagnosis not present

## 2020-10-13 LAB — IGG, IGA, IGM
IgA: 39 mg/dL — ABNORMAL LOW (ref 61–437)
IgG (Immunoglobin G), Serum: 601 mg/dL — ABNORMAL LOW (ref 603–1613)
IgM (Immunoglobulin M), Srm: 104 mg/dL (ref 15–143)

## 2020-10-13 LAB — KAPPA/LAMBDA LIGHT CHAINS
Kappa free light chain: 4.4 mg/L (ref 3.3–19.4)
Kappa, lambda light chain ratio: 0.2 — ABNORMAL LOW (ref 0.26–1.65)
Lambda free light chains: 21.7 mg/L (ref 5.7–26.3)

## 2020-10-13 MED ORDER — ACETAMINOPHEN 325 MG PO TABS
ORAL_TABLET | ORAL | Status: AC
  Filled 2020-10-13: qty 2

## 2020-10-13 MED ORDER — DIPHENHYDRAMINE HCL 25 MG PO CAPS
25.0000 mg | ORAL_CAPSULE | Freq: Once | ORAL | Status: AC
  Administered 2020-10-13: 25 mg via ORAL

## 2020-10-13 MED ORDER — HEPARIN SOD (PORK) LOCK FLUSH 100 UNIT/ML IV SOLN
500.0000 [IU] | Freq: Every day | INTRAVENOUS | Status: AC | PRN
  Administered 2020-10-13: 500 [IU]
  Filled 2020-10-13: qty 5

## 2020-10-13 MED ORDER — DIPHENHYDRAMINE HCL 25 MG PO CAPS
ORAL_CAPSULE | ORAL | Status: AC
  Filled 2020-10-13: qty 1

## 2020-10-13 MED ORDER — SODIUM CHLORIDE 0.9% FLUSH
10.0000 mL | INTRAVENOUS | Status: AC | PRN
  Administered 2020-10-13: 10 mL
  Filled 2020-10-13: qty 10

## 2020-10-13 MED ORDER — ACETAMINOPHEN 325 MG PO TABS
650.0000 mg | ORAL_TABLET | Freq: Once | ORAL | Status: AC
  Administered 2020-10-13: 650 mg via ORAL

## 2020-10-13 MED ORDER — SODIUM CHLORIDE 0.9% IV SOLUTION
250.0000 mL | Freq: Once | INTRAVENOUS | Status: AC
  Administered 2020-10-13: 250 mL via INTRAVENOUS
  Filled 2020-10-13: qty 250

## 2020-10-13 NOTE — Telephone Encounter (Signed)
Received a call from Rich Fuchs requesting an update from patients Visit.  Dr Marin Olp said that we are still trying to get him to gain weight which has been difficult.  Everything depends on his Myeloma studies which we will wait to get back.  [name] stated that patients Infectious Disease doctor recommended Evushield to help prevent Covid.  Orders placed and Otilio Carpen notified to get prior authorization

## 2020-10-13 NOTE — Addendum Note (Signed)
Addended by: Burney Gauze R on: 10/13/2020 03:11 PM   Modules accepted: Orders

## 2020-10-13 NOTE — Patient Instructions (Signed)

## 2020-10-14 LAB — TYPE AND SCREEN
ABO/RH(D): O POS
Antibody Screen: NEGATIVE
Unit division: 0

## 2020-10-14 LAB — BPAM RBC
Blood Product Expiration Date: 202203312359
ISSUE DATE / TIME: 202203040750
Unit Type and Rh: 5100

## 2020-10-16 ENCOUNTER — Ambulatory Visit (INDEPENDENT_AMBULATORY_CARE_PROVIDER_SITE_OTHER): Payer: Medicare HMO

## 2020-10-16 DIAGNOSIS — I639 Cerebral infarction, unspecified: Secondary | ICD-10-CM | POA: Diagnosis not present

## 2020-10-16 LAB — PROTEIN ELECTROPHORESIS, SERUM
A/G Ratio: 1.3 (ref 0.7–1.7)
Albumin ELP: 3 g/dL (ref 2.9–4.4)
Alpha-1-Globulin: 0.3 g/dL (ref 0.0–0.4)
Alpha-2-Globulin: 0.8 g/dL (ref 0.4–1.0)
Beta Globulin: 0.7 g/dL (ref 0.7–1.3)
Gamma Globulin: 0.6 g/dL (ref 0.4–1.8)
Globulin, Total: 2.4 g/dL (ref 2.2–3.9)
M-Spike, %: 0.2 g/dL — ABNORMAL HIGH
Total Protein ELP: 5.4 g/dL — ABNORMAL LOW (ref 6.0–8.5)

## 2020-10-17 ENCOUNTER — Other Ambulatory Visit: Payer: Self-pay | Admitting: Internal Medicine

## 2020-10-17 ENCOUNTER — Ambulatory Visit: Payer: Medicare HMO | Admitting: Gastroenterology

## 2020-10-17 LAB — CUP PACEART REMOTE DEVICE CHECK
Date Time Interrogation Session: 20220301230342
Implantable Pulse Generator Implant Date: 20200909

## 2020-10-18 ENCOUNTER — Other Ambulatory Visit: Payer: Self-pay | Admitting: Hematology & Oncology

## 2020-10-18 DIAGNOSIS — I10 Essential (primary) hypertension: Secondary | ICD-10-CM

## 2020-10-24 NOTE — Progress Notes (Signed)
Carelink Summary Report / Loop Recorder 

## 2020-10-26 ENCOUNTER — Other Ambulatory Visit: Payer: Self-pay

## 2020-10-26 ENCOUNTER — Ambulatory Visit (INDEPENDENT_AMBULATORY_CARE_PROVIDER_SITE_OTHER): Payer: Medicare HMO | Admitting: Gastroenterology

## 2020-10-26 ENCOUNTER — Encounter: Payer: Self-pay | Admitting: Gastroenterology

## 2020-10-26 VITALS — BP 162/80 | HR 74 | Ht 69.0 in | Wt 128.6 lb

## 2020-10-26 DIAGNOSIS — R1312 Dysphagia, oropharyngeal phase: Secondary | ICD-10-CM | POA: Diagnosis not present

## 2020-10-26 NOTE — Progress Notes (Signed)
P  Chief Complaint:    Hospital follow-up, oropharyngeal dysphagia, history of esophageal candidiasis  GI History: 87 year old male w/ a past medical history of hypertension, CVA, CKD stage III, multiple myelomawith recurrence on Bendumistine and IVIG, jaw osteomyelitis, IDA and UGI bleed 2/2021secondary to a bleeding duodenal ulcer, esophageal candidiasisand pneumonia requiring hospitalization 09/28/2019.   EGD 09/17/2019 by Dr. Bryan Lemma: - Esophageal candidiasis. Biopsied. (Treated with diflucan). - Non-obstructing and mild Schatzki ring. - Bleb found in the esophagus. - 2 cm hiatal hernia. - Gastritis. Biopsied. - Hematin (altered blood/coffee-ground-like material) in the entire stomach. - Oozing duodenal ulcers. Clip was placed. Biopsied. - Normal second portion of the duodenum.  Modified swallowing study 12/14/2019: SLPsuspects patient's primary symptoms are due to decreased relaxation of UES.Assessed to be a mild aspiration risk.  Esophagram 09/27/2020: Prematurely terminated exam due to small volume aspiration of thick barium.  Recommend MBS.  Patient had difficulty swallowing contrast, so esophagus not fully visualized.  Mild delay of 13 mm barium tablet at level of aortic arch with subsequent free passage into stomach.  Moderate intermittent esophageal dysmotility, small HH.  MBS 09/28/2020: Mild to moderate pharyngoesophageal dysphagia with penetration into laryngeal vestibule without aspiration.  Decreased UES opening and esophageal backflow noted into cervical esophagus.  Patient at mild-moderate risk for aspiration without utilization of swallowing strategies/aspiration precautions.  Recommend supplements via high-protein shakes and/for high protein/caloric foods.  Recommend dysphagia 3 (mechanical soft) solids, thin liquids  HPI:     Patient is a 87 y.o. male presenting [facility] for hospital follow-up.  Last seen by Alonza Bogus on 09/20/2020 for  evaluation of dysphagia.  Was admitted to the hospital 2/16-18 with aspiration pneumonitis 2/2 coughing/aspiration episode during outpatient esophagram.  Was managed with largely supportive care without need for ABX or supplemental O2.  Esophagram and subsequent inpatient MBS as noted above.  States that he otherwise feels fine today.  Tolerating diet as recommended by inpatient speech pathology.  Offers no complaints. Scheduled to re-start speech therapy.  Presents with his grandson.  Continues to follow closely with Dr. Marin Olp in the Hematology/Oncology clinic.  Review of systems:     No chest pain, no SOB, no fevers, no urinary sx   Past Medical History:  Diagnosis Date  . Anemia   . Blood transfusion without reported diagnosis   . CKD (chronic kidney disease) 07/06/2018   CKD stage III  . Goals of care, counseling/discussion 02/04/2019  . History of radiation therapy 06/17/13-07/06/13   35 gray to upper lumbar spine  . 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 Hypertension   . Inguinal hernia   . Multiple myeloma [facility]) Sept 2010  . Multiple myeloma in relapse ([facility]) 04/05/2016  . Radiation 09/26/15-10/20/15   lower thoracic spine, upper lumbar spine 30 gray  . Stroke [facility])     Patient's surgical history, family medical history, social history, medications and allergies were all reviewed in Epic    Current Outpatient Medications  Medication Sig Dispense Refill  . amLODipine-benazepril (LOTREL) 5-10 MG capsule TAKE ONE CAPSULE EACH DAY 30 capsule 2  . aspirin EC 81 MG tablet Take 1 tablet (81 mg total) by mouth daily. Swallow whole. 30 tablet 11  . dronabinol (MARINOL) 5 MG capsule TAKE ONE CAPSULE TWICE A DAY BEFORE A MEAL 60 capsule 2  . DULoxetine (CYMBALTA) 60 MG capsule TAKE ONE CAPSULE EACH DAY 30 capsule 3  . finasteride (PROSCAR) 5 MG tablet TAKE ONE TABLET EACH DAY (Patient  taking differently: Take 5 mg by mouth daily.) 30 tablet 6  . lidocaine-prilocaine (EMLA)  cream Apply 1 application topically as needed. 30 g 0  . loperamide (IMODIUM A-D) 2 MG tablet Take 2 mg by mouth daily.    . metoprolol succinate (TOPROL-XL) 25 MG 24 hr tablet Take 25 mg by mouth daily.    . ondansetron (ZOFRAN) 4 MG tablet Take 1 tablet (4 mg total) by mouth every 6 (six) hours as needed for nausea. (Patient not taking: Reported on 10/12/2020) 20 tablet 0  . pantoprazole (PROTONIX) 40 MG tablet Take 1 tablet (40 mg total) by mouth 2 (two) times daily. (Patient taking differently: Take 40 mg by mouth daily.) 60 tablet 3  . penicillin v potassium (VEETID) 500 MG tablet TAKE ONE TABLET FOUR TIMES DAILY 120 tablet 1  . polyvinyl alcohol (LIQUIFILM TEARS) 1.4 % ophthalmic solution Place 1 drop into both eyes as needed for dry eyes. 15 mL 0  . predniSONE (DELTASONE) 20 MG tablet TAKE ONE TABLET EACH DAY WITH BREAKFAST 30 tablet 2  . Probiotic Product (PROBIOTIC PO) Take 1 capsule by mouth daily.    Marland Kitchen pyridOXINE (VITAMIN B-6) 100 MG tablet Take 100 mg by mouth daily.     . vitamin B-12 (CYANOCOBALAMIN) 500 MCG tablet Take 500 mcg by mouth daily.    . Vitamin D, Ergocalciferol, (DRISDOL) 50000 UNITS CAPS capsule Take 50,000 Units by mouth every Sunday.     Marland Kitchen White Petrolatum-Mineral Oil (EYE LUBRICANT) OINT Apply 1 application to eye in the morning and at bedtime. If left eye lower eye lid is irritated 3.5 g 0   No current facility-administered medications for this visit.    Physical Exam:     There were no vitals taken for this visit.  GENERAL:  Pleasant male in NAD PSYCH: : Cooperative, normal affect NEURO: Alert and oriented x 3, no focal neurologic deficits   IMPRESSION and PLAN:    1) Oropharyngeal dysphagia -Continue current dysphagia 3 diet -Encouraged to continue drinking calorie rich shakes as currently doing -Has appointment scheduled with speech therapy -No role for endoscopic evaluation/treatment  - RTC prn           Lavena Bullion ,DO, FACG 10/26/2020,  10:44 AM

## 2020-10-26 NOTE — Patient Instructions (Signed)
If you are age 85 or older, your body mass index should be between 23-30. Your Body mass index is 18.99 kg/m. If this is out of the aforementioned range listed, please consider follow up with your Primary Care Provider.  If you are age 24 or younger, your body mass index should be between 19-25. Your Body mass index is 18.99 kg/m. If this is out of the aformentioned range listed, please consider follow up with your Primary Care Provider.   Follow up as needed  Due to recent changes in healthcare laws, you may see the results of your imaging and laboratory studies on MyChart before your provider has had a chance to review them.  We understand that in some cases there may be results that are confusing or concerning to you. Not all laboratory results come back in the same time frame and the provider may be waiting for multiple results in order to interpret others.  Please give [place] 48 hours in order for your provider to thoroughly review all the results before contacting the office for clarification of your results.   Thank you for choosing me and [facility].  Vito Cirigliano, D.O.

## 2020-10-31 ENCOUNTER — Encounter: Payer: Self-pay | Admitting: Family

## 2020-10-31 ENCOUNTER — Inpatient Hospital Stay: Payer: Medicare HMO

## 2020-10-31 ENCOUNTER — Inpatient Hospital Stay (HOSPITAL_BASED_OUTPATIENT_CLINIC_OR_DEPARTMENT_OTHER): Payer: Medicare HMO | Admitting: Family

## 2020-10-31 ENCOUNTER — Other Ambulatory Visit: Payer: Self-pay

## 2020-10-31 VITALS — BP 130/74 | HR 72 | Temp 97.6°F | Resp 19 | Ht 69.0 in | Wt 127.1 lb

## 2020-10-31 DIAGNOSIS — D508 Other iron deficiency anemias: Secondary | ICD-10-CM | POA: Diagnosis not present

## 2020-10-31 DIAGNOSIS — Z5111 Encounter for antineoplastic chemotherapy: Secondary | ICD-10-CM | POA: Diagnosis not present

## 2020-10-31 DIAGNOSIS — D649 Anemia, unspecified: Secondary | ICD-10-CM | POA: Diagnosis not present

## 2020-10-31 DIAGNOSIS — C9000 Multiple myeloma not having achieved remission: Secondary | ICD-10-CM

## 2020-10-31 DIAGNOSIS — C9001 Multiple myeloma in remission: Secondary | ICD-10-CM

## 2020-10-31 DIAGNOSIS — Z95828 Presence of other vascular implants and grafts: Secondary | ICD-10-CM

## 2020-10-31 LAB — FERRITIN: Ferritin: 753 ng/mL — ABNORMAL HIGH (ref 24–336)

## 2020-10-31 LAB — LACTATE DEHYDROGENASE: LDH: 333 U/L — ABNORMAL HIGH (ref 98–192)

## 2020-10-31 LAB — IRON AND TIBC
Iron: 68 ug/dL (ref 42–163)
Saturation Ratios: 30 % (ref 20–55)
TIBC: 229 ug/dL (ref 202–409)
UIBC: 161 ug/dL (ref 117–376)

## 2020-10-31 LAB — CMP (CANCER CENTER ONLY)
ALT: 15 U/L (ref 0–44)
AST: 33 U/L (ref 15–41)
Albumin: 3.3 g/dL — ABNORMAL LOW (ref 3.5–5.0)
Alkaline Phosphatase: 40 U/L (ref 38–126)
Anion gap: 6 (ref 5–15)
BUN: 63 mg/dL — ABNORMAL HIGH (ref 8–23)
CO2: 28 mmol/L (ref 22–32)
Calcium: 8.9 mg/dL (ref 8.9–10.3)
Chloride: 106 mmol/L (ref 98–111)
Creatinine: 1.66 mg/dL — ABNORMAL HIGH (ref 0.61–1.24)
GFR, Estimated: 39 mL/min — ABNORMAL LOW (ref >60)
Glucose, Bld: 134 mg/dL — ABNORMAL HIGH (ref 70–99)
Potassium: 4.1 mmol/L (ref 3.5–5.1)
Sodium: 140 mmol/L (ref 135–145)
Total Bilirubin: 0.6 mg/dL (ref 0.3–1.2)
Total Protein: 5.9 g/dL — ABNORMAL LOW (ref 6.5–8.1)

## 2020-10-31 LAB — CBC WITH DIFFERENTIAL (CANCER CENTER ONLY)
Abs Immature Granulocytes: 0.19 10*3/uL — ABNORMAL HIGH (ref 0.00–0.07)
Basophils Absolute: 0 10*3/uL (ref 0.0–0.1)
Basophils Relative: 2 %
Eosinophils Absolute: 0 10*3/uL (ref 0.0–0.5)
Eosinophils Relative: 2 %
HCT: 30.1 % — ABNORMAL LOW (ref 39.0–52.0)
Hemoglobin: 9.9 g/dL — ABNORMAL LOW (ref 13.0–17.0)
Immature Granulocytes: 10 %
Lymphocytes Relative: 3 %
Lymphs Abs: 0.1 10*3/uL — ABNORMAL LOW (ref 0.7–4.0)
MCH: 30.7 pg (ref 26.0–34.0)
MCHC: 32.9 g/dL (ref 30.0–36.0)
MCV: 93.2 fL (ref 80.0–100.0)
Monocytes Absolute: 0.6 10*3/uL (ref 0.1–1.0)
Monocytes Relative: 30 %
Neutro Abs: 1.1 10*3/uL — ABNORMAL LOW (ref 1.7–7.7)
Neutrophils Relative %: 53 %
Platelet Count: 87 10*3/uL — ABNORMAL LOW (ref 150–400)
RBC: 3.23 MIL/uL — ABNORMAL LOW (ref 4.22–5.81)
RDW: 17.5 % — ABNORMAL HIGH (ref 11.5–15.5)
WBC Count: 2 10*3/uL — ABNORMAL LOW (ref 4.0–10.5)
nRBC: 0 % (ref 0.0–0.2)

## 2020-10-31 LAB — SAMPLE TO BLOOD BANK

## 2020-10-31 LAB — RETICULOCYTES
Immature Retic Fract: 5.2 % (ref 2.3–15.9)
RBC.: 3.26 MIL/uL — ABNORMAL LOW (ref 4.22–5.81)
Retic Count, Absolute: 28 10*3/uL (ref 19.0–186.0)
Retic Ct Pct: 0.9 % (ref 0.4–3.1)

## 2020-10-31 MED ORDER — CILGAVIMAB (PART OF EVUSHELD) INJECTION
300.0000 mg | Freq: Once | INTRAMUSCULAR | Status: AC
  Administered 2020-10-31: 300 mg via INTRAMUSCULAR
  Filled 2020-10-31: qty 3

## 2020-10-31 MED ORDER — TIXAGEVIMAB (PART OF EVUSHELD) INJECTION
300.0000 mg | Freq: Once | INTRAMUSCULAR | Status: AC
  Administered 2020-10-31: 300 mg via INTRAMUSCULAR
  Filled 2020-10-31: qty 3

## 2020-10-31 MED ORDER — EPOETIN ALFA-EPBX 40000 UNIT/ML IJ SOLN
40000.0000 [IU] | Freq: Once | INTRAMUSCULAR | Status: AC
  Administered 2020-10-31: 40000 [IU] via SUBCUTANEOUS

## 2020-10-31 MED ORDER — SODIUM CHLORIDE 0.9% FLUSH
10.0000 mL | Freq: Once | INTRAVENOUS | Status: AC
  Administered 2020-10-31: 10 mL via INTRAVENOUS
  Filled 2020-10-31: qty 10

## 2020-10-31 MED ORDER — HEPARIN SOD (PORK) LOCK FLUSH 100 UNIT/ML IV SOLN
500.0000 [IU] | Freq: Once | INTRAVENOUS | Status: AC
  Administered 2020-10-31: 500 [IU] via INTRAVENOUS
  Filled 2020-10-31: qty 5

## 2020-10-31 MED ORDER — SODIUM CHLORIDE 0.9 % IV SOLN
Freq: Once | INTRAVENOUS | Status: DC
  Filled 2020-10-31: qty 250

## 2020-10-31 MED ORDER — EPOETIN ALFA-EPBX 40000 UNIT/ML IJ SOLN
INTRAMUSCULAR | Status: AC
  Filled 2020-10-31: qty 1

## 2020-10-31 NOTE — Patient Instructions (Signed)
Tunneled Central Venous Catheter Flushing Guide  It is important to flush your tunneled central venous catheter each time you use it, both before and after you use it. Flushing your catheter will help prevent it from clogging. What are the risks? Risks may include:  Infection.  Air getting into the catheter and bloodstream. Supplies needed:  A clean pair of gloves.  A disinfecting wipe. Use an alcohol wipe, chlorhexidine wipe, or iodine wipe as told by your health care provider.  A 10 mL syringe that has been prefilled with saline solution.  An empty 10 mL syringe, if a substance called heparin was injected into your catheter. How to flush your catheter When you flush your catheter, make sure you follow any specific instructions from your health care provider or the manufacturer. These are general guidelines. Flushing your catheter before use If there is heparin in your catheter: 1. Wash your hands with soap and water. 2. Put on gloves. 3. Scrub the injection cap for a minimum of 15 seconds with a disinfecting wipe. 4. Unclamp the catheter. 5. Attach the empty syringe to the injection cap. 6. Pull the syringe plunger back and withdraw 10 mL of blood. 7. Place the syringe into an appropriate waste container. 8. Scrub the injection cap for 15 seconds with a disinfecting wipe. 9. Attach the prefilled syringe to the injection cap. 10. Flush the catheter by pushing the plunger forward until all the liquid from the syringe is in the catheter. 11. Remove the syringe from the injection cap. 12. Clamp the catheter. If there is no heparin in your catheter: 1. Wash your hands with soap and water. 2. Put on gloves. 3. Scrub the injection cap for 15 seconds with a disinfecting wipe. 4. Unclamp the catheter. 5. Attach the prefilled syringe to the injection cap. 6. Flush the catheter by pushing the plunger forward until 5 mL of the liquid from the syringe is in the catheter. 7. Pull back on  the syringe until you see blood in the catheter. 8. If you have been asked to collect any blood, follow your health care provider's instructions. Otherwise, flush the catheter with the rest of the solution from the syringe. 9. Remove the syringe from the injection cap. 10. Clamp the catheter.   Flushing your catheter after use 1. Wash your hands with soap and water. 2. Put on gloves. 3. Scrub the injection cap for 15 seconds with a disinfecting wipe. 4. Unclamp the catheter. 5. Attach the prefilled syringe to the injection cap. 6. Flush the catheter by pushing the plunger forward until all of the liquid from the syringe is in the catheter. 7. Remove the syringe from the injection cap. 8. Clamp the catheter. Problems and solutions  If blood cannot be completely cleared from the injection cap, you may need to have the injection cap replaced.  If the catheter is difficult to flush, use the pulsing method. The pulsing method involves pushing only a few milliliters of solution into the catheter at a time and pausing between pushes.  If you do not see blood in the catheter when you pull back on the syringe, change your body position, such as by raising your arms above your head. Take a deep breath and cough. Then, pull back on the syringe. If you still do not see blood, flush the catheter with a small amount of solution. Then, change positions again and take a breath or cough. Pull back on the syringe again. If you still do not   see blood, finish flushing the catheter and contact your health care provider. Do not use your catheter until your health care provider says it is okay. General tips  Have someone help you flush your catheter, if possible.  Do not force fluid through your catheter.  Do not use a syringe that is larger or smaller than 10 mL. Using a smaller syringe can make the catheter burst.  Do not use your catheter without flushing it first if it has heparin in it. Contact a health  care provider if:  You cannot see any blood in the catheter when you flush it before using it.  Your catheter is difficult to flush. Get help right away if:  You cannot flush the catheter.  The catheter leaks when you flush it or when there is fluid in it.  There are cracks or breaks in the catheter. Summary  It is important to flush your tunneled central venous catheter each time you use it, both before and after you use it.  Scrub the injection cap for 15 seconds with a disinfecting wipe before and after you flush it.  When you flush your catheter, make sure you follow any specific instructions from your health care provider or the manufacturer.  Get help right away if you cannot flush the catheter. This information is not intended to replace advice given to you by your health care provider. Make sure you discuss any questions you have with your health care provider. Document Revised: 10/07/2019 Document Reviewed: 10/14/2018 Elsevier Patient Education  2021 Elsevier Inc.  

## 2020-10-31 NOTE — Progress Notes (Addendum)
Hematology and Oncology Follow Up Visit  VIVIAN OKELLEY [id number] [birth year] 87 y.o. 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 Palliative radiation therapy to right ilium Kyprolis/Cytoxanq 3 week dosing- s/p cycle 24 (Cytoxan restarted on cycle 13) - DC'd due to progression Radiation therapy to right femur, 10 fraction --completed 3000 rad on 11/06/2018 Elotuzomab - started 10/29/2018 -- s/p cycle #1 -- d/c on 12/24/2018 Pomalidomide 2 mg po q day (21 on/7 off) - started 10/29/2018 -- d/c 12/31/2018 Daratumumab - start cycle # 1 on 12/31/2018 -- d/c on -02/04/2019 Zometa IV q 4 weeks -permanently discontinueddue to chronic osteomyelitis of the left jaw  Current Therapy: Bendamustine/Decadron -- started on 02/10/2019, s/p cycle16-- Bendamustine given just 1 day Retacritneeded for hemoglobin less than 10 IVIG 40g IV q month -- startedon 07/2019   Interim History:  Mr. Nghiem is here today for follow-up. He will be getting Evushield today and Retacrit.  He had Moh's surgery on his nose with skin graft from his left ear. He states that he is healing nicely. Bandages in place are clean and dry. No odor or discharge.   He denies any blood loss. No abnormal bruising or petechiae.  Earlier this month M-spike was stable at 0.2 g/dL, IgG level 601 mg/dL and lambda light chains 21.7 mg/L. No fever, chills, n/v, cough, rash, dizziness, SOB, chest pain, palpitations, abdominal pain or changes in bowel or bladder habits.  He still has diarrhea but states that this seems to be a little better.  His main issue is the dysphagia as well as the osteomyelitis in the left jaw complicating his eating. He struggles  swallowing as well as chewing his foods. He states that he is drinking a lot of Boost.  He states that the daily PCN keeps him thirsty. He is doing his best to stay well hydrated. His weight is stable at 127 lbs.  No swelling in his extremities at this time.  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  No falls or syncope to report. He is ambulating with his Rolator for added support.   ECOG Performance Status: 1 - Symptomatic but completely ambulatory  Medications:  Allergies as of 10/31/2020      Reactions   Sulfa Antibiotics Itching   Sulfasalazine Itching      Medication List       Accurate as of October 31, 2020 10:26 AM. If you have any questions, ask your nurse or doctor.        amLODipine-benazepril 5-10 MG capsule Commonly known as: LOTREL TAKE ONE CAPSULE EACH DAY   aspirin EC 81 MG tablet Take 1 tablet (81 mg total) by mouth daily. Swallow whole.   dronabinol 5 MG capsule Commonly known as: MARINOL TAKE ONE CAPSULE TWICE A DAY BEFORE A MEAL   DULoxetine 60 MG capsule Commonly known as: CYMBALTA TAKE ONE CAPSULE EACH DAY   Eye Lubricant Oint Apply 1 application to eye in the morning and at bedtime. If left eye lower eye lid is irritated   finasteride 5 MG tablet Commonly known as: PROSCAR TAKE ONE TABLET EACH DAY What changed: See the new instructions.   lidocaine-prilocaine cream Commonly known as: EMLA Apply 1 application topically as needed.   loperamide 2 MG tablet  Commonly known as: IMODIUM A-D Take 2 mg by mouth daily.   metoprolol succinate 25 MG 24 hr tablet Commonly known as: TOPROL-XL Take 25 mg by mouth daily.   ondansetron 4 MG tablet Commonly known as: ZOFRAN Take 1 tablet (4 mg total) by mouth every 6 (six) hours as needed for nausea.   pantoprazole 40 MG tablet Commonly known as: Protonix Take 1 tablet (40 mg total) by mouth 2 (two) times daily. What changed: when to take this   penicillin v potassium 500 MG tablet Commonly known  as: VEETID TAKE ONE TABLET FOUR TIMES DAILY   polyvinyl alcohol 1.4 % ophthalmic solution Commonly known as: LIQUIFILM TEARS Place 1 drop into both eyes as needed for dry eyes.   predniSONE 20 MG tablet Commonly known as: DELTASONE TAKE ONE TABLET EACH DAY WITH BREAKFAST   PROBIOTIC PO Take 1 capsule by mouth daily.   pyridOXINE 100 MG tablet Commonly known as: VITAMIN B-6 Take 100 mg by mouth daily.   vitamin B-12 500 MCG tablet Commonly known as: CYANOCOBALAMIN Take 500 mcg by mouth daily.   Vitamin D (Ergocalciferol) 1.25 MG (50000 UNIT) Caps capsule Commonly known as: DRISDOL Take 50,000 Units by mouth every Sunday.       Allergies:  Allergies  Allergen Reactions  . Sulfa Antibiotics Itching  . Sulfasalazine Itching    Past Medical History, Surgical history, Social history, and Family History were reviewed and updated.  Review of Systems: All other 10 point review of systems is negative.   Physical Exam:  height is 5\' 9"  (1.753 m) and weight is 127 lb 1.6 oz (57.7 kg). His oral temperature is 97.6 F (36.4 C). His blood pressure is 130/74 and his pulse is 72. His respiration is 19 and oxygen saturation is 100%.   Wt Readings from Last 3 Encounters:  10/31/20 127 lb 1.6 oz (57.7 kg)  10/26/20 128 lb 9 oz (58.3 kg)  10/12/20 128 lb 6.4 oz (58.2 kg)    Ocular: Sclerae unicteric, pupils equal, round and reactive to light Ear-nose-throat: Oropharynx clear, dentition fair Lymphatic: No cervical or supraclavicular adenopathy Lungs no rales or rhonchi, good excursion bilaterally Heart regular rate and rhythm, no murmur appreciated Abd soft, nontender, positive bowel sounds MSK no focal spinal tenderness, no joint edema Neuro: non-focal, well-oriented, appropriate affect Breasts: Deferred   Lab Results  Component Value Date   WBC 2.0 (L) 10/31/2020   HGB 9.9 (L) 10/31/2020   HCT 30.1 (L) 10/31/2020   MCV 93.2 10/31/2020   PLT 87 (L) 10/31/2020   Lab  Results  Component Value Date   FERRITIN 638 (H) 10/12/2020   IRON 82 10/12/2020   TIBC 231 10/12/2020   UIBC 149 10/12/2020   IRONPCTSAT 36 10/12/2020   Lab Results  Component Value Date   RETICCTPCT 0.9 10/31/2020   RBC 3.23 (L) 10/31/2020   RBC 3.26 (L) 10/31/2020   Lab Results  Component Value Date   KPAFRELGTCHN 4.4 10/12/2020   LAMBDASER 21.7 10/12/2020   KAPLAMBRATIO 0.20 (L) 10/12/2020   Lab Results  Component Value Date   IGGSERUM 601 (L) 10/12/2020   IGA 39 (L) 10/12/2020   IGMSERUM 104 10/12/2020   Lab Results  Component Value Date   TOTALPROTELP 5.4 (L) 10/12/2020   ALBUMINELP 3.0 10/12/2020   A1GS 0.3 10/12/2020   A2GS 0.8 10/12/2020   BETS 0.7 10/12/2020   BETA2SER 0.3 07/28/2015   GAMS 0.6 10/12/2020   MSPIKE 0.2 (H) 10/12/2020   SPEI  Comment 10/12/2020     Chemistry      Component Value Date/Time   NA 140 10/31/2020 0930   NA 146 (H) 08/07/2017 1153   NA 141 01/09/2017 1004   K 4.1 10/31/2020 0930   K 4.6 08/07/2017 1153   K 4.4 01/09/2017 1004   CL 106 10/31/2020 0930   CL 108 08/07/2017 1153   CO2 28 10/31/2020 0930   CO2 26 08/07/2017 1153   CO2 22 01/09/2017 1004   BUN 63 (H) 10/31/2020 0930   BUN 24 (H) 08/07/2017 1153   BUN 23.9 01/09/2017 1004   CREATININE 1.66 (H) 10/31/2020 0930   CREATININE 1.56 (H) 05/26/2018 1508   CREATININE 1.5 (H) 01/09/2017 1004      Component Value Date/Time   CALCIUM 8.9 10/31/2020 0930   CALCIUM 8.8 08/07/2017 1153   CALCIUM 8.8 01/09/2017 1004   ALKPHOS 40 10/31/2020 0930   ALKPHOS 97 (H) 08/07/2017 1153   ALKPHOS 80 01/09/2017 1004   AST 33 10/31/2020 0930   AST 18 01/09/2017 1004   ALT 15 10/31/2020 0930   ALT 20 08/07/2017 1153   ALT 12 01/09/2017 1004   BILITOT 0.6 10/31/2020 0930   BILITOT 0.92 01/09/2017 1004       Impression and Plan: Mr. Studley is a very pleasant 87 yo caucasian gentleman with relapsed IgG lambda myeloma. He is here today for Evushield injections and Retacrit.  He  will return next week for lab and chemotherapy.  MD follow-up in April with next cycle.  He was encouraged to contact our office with any questions or concerns.   Laverna Peace, NP 3/22/202210:26 AM

## 2020-10-31 NOTE — Addendum Note (Signed)
Addended by: Lucile Crater on: 10/31/2020 10:39 AM   Modules accepted: Orders

## 2020-11-01 LAB — IGG, IGA, IGM
IgA: 20 mg/dL — ABNORMAL LOW (ref 61–437)
IgG (Immunoglobin G), Serum: 829 mg/dL (ref 603–1613)
IgM (Immunoglobulin M), Srm: 75 mg/dL (ref 15–143)

## 2020-11-01 LAB — KAPPA/LAMBDA LIGHT CHAINS
Kappa free light chain: 5 mg/L (ref 3.3–19.4)
Kappa, lambda light chain ratio: 0.24 — ABNORMAL LOW (ref 0.26–1.65)
Lambda free light chains: 20.7 mg/L (ref 5.7–26.3)

## 2020-11-06 ENCOUNTER — Telehealth: Payer: Self-pay

## 2020-11-06 NOTE — Telephone Encounter (Signed)
S/w pt and r/s his 11/09/20 appts per sch message, new appts set for 11/13/20   Christian Burns

## 2020-11-08 ENCOUNTER — Other Ambulatory Visit: Payer: Self-pay | Admitting: Hematology & Oncology

## 2020-11-09 ENCOUNTER — Other Ambulatory Visit: Payer: Medicare HMO

## 2020-11-09 ENCOUNTER — Ambulatory Visit: Payer: Medicare HMO

## 2020-11-10 ENCOUNTER — Other Ambulatory Visit: Payer: Medicare HMO

## 2020-11-10 ENCOUNTER — Other Ambulatory Visit: Payer: Self-pay

## 2020-11-10 ENCOUNTER — Ambulatory Visit: Payer: Medicare HMO

## 2020-11-10 DIAGNOSIS — C9000 Multiple myeloma not having achieved remission: Secondary | ICD-10-CM

## 2020-11-13 ENCOUNTER — Inpatient Hospital Stay: Payer: Medicare HMO

## 2020-11-13 ENCOUNTER — Inpatient Hospital Stay: Payer: Medicare HMO | Attending: Hematology & Oncology

## 2020-11-13 ENCOUNTER — Other Ambulatory Visit: Payer: Self-pay

## 2020-11-13 VITALS — BP 142/74 | HR 77 | Temp 97.9°F | Resp 18

## 2020-11-13 DIAGNOSIS — C9000 Multiple myeloma not having achieved remission: Secondary | ICD-10-CM

## 2020-11-13 DIAGNOSIS — C9002 Multiple myeloma in relapse: Secondary | ICD-10-CM | POA: Insufficient documentation

## 2020-11-13 DIAGNOSIS — D6489 Other specified anemias: Secondary | ICD-10-CM | POA: Diagnosis not present

## 2020-11-13 DIAGNOSIS — Z79899 Other long term (current) drug therapy: Secondary | ICD-10-CM | POA: Insufficient documentation

## 2020-11-13 DIAGNOSIS — Z5111 Encounter for antineoplastic chemotherapy: Secondary | ICD-10-CM | POA: Diagnosis present

## 2020-11-13 LAB — CBC WITH DIFFERENTIAL (CANCER CENTER ONLY)
Abs Immature Granulocytes: 0.11 10*3/uL — ABNORMAL HIGH (ref 0.00–0.07)
Basophils Absolute: 0.1 10*3/uL (ref 0.0–0.1)
Basophils Relative: 2 %
Eosinophils Absolute: 0.1 10*3/uL (ref 0.0–0.5)
Eosinophils Relative: 2 %
HCT: 31.5 % — ABNORMAL LOW (ref 39.0–52.0)
Hemoglobin: 10.2 g/dL — ABNORMAL LOW (ref 13.0–17.0)
Immature Granulocytes: 4 %
Lymphocytes Relative: 4 %
Lymphs Abs: 0.1 10*3/uL — ABNORMAL LOW (ref 0.7–4.0)
MCH: 30.1 pg (ref 26.0–34.0)
MCHC: 32.4 g/dL (ref 30.0–36.0)
MCV: 92.9 fL (ref 80.0–100.0)
Monocytes Absolute: 0.9 10*3/uL (ref 0.1–1.0)
Monocytes Relative: 35 %
Neutro Abs: 1.4 10*3/uL — ABNORMAL LOW (ref 1.7–7.7)
Neutrophils Relative %: 53 %
Platelet Count: 114 10*3/uL — ABNORMAL LOW (ref 150–400)
RBC: 3.39 MIL/uL — ABNORMAL LOW (ref 4.22–5.81)
RDW: 18.4 % — ABNORMAL HIGH (ref 11.5–15.5)
WBC Count: 2.6 10*3/uL — ABNORMAL LOW (ref 4.0–10.5)
nRBC: 0 % (ref 0.0–0.2)

## 2020-11-13 LAB — CMP (CANCER CENTER ONLY)
ALT: 13 U/L (ref 0–44)
AST: 27 U/L (ref 15–41)
Albumin: 3.3 g/dL — ABNORMAL LOW (ref 3.5–5.0)
Alkaline Phosphatase: 48 U/L (ref 38–126)
Anion gap: 7 (ref 5–15)
BUN: 64 mg/dL — ABNORMAL HIGH (ref 8–23)
CO2: 26 mmol/L (ref 22–32)
Calcium: 9.1 mg/dL (ref 8.9–10.3)
Chloride: 105 mmol/L (ref 98–111)
Creatinine: 1.77 mg/dL — ABNORMAL HIGH (ref 0.61–1.24)
GFR, Estimated: 36 mL/min — ABNORMAL LOW (ref >60)
Glucose, Bld: 99 mg/dL (ref 70–99)
Potassium: 4.4 mmol/L (ref 3.5–5.1)
Sodium: 138 mmol/L (ref 135–145)
Total Bilirubin: 0.7 mg/dL (ref 0.3–1.2)
Total Protein: 5.9 g/dL — ABNORMAL LOW (ref 6.5–8.1)

## 2020-11-13 MED ORDER — SODIUM CHLORIDE 0.9 % IV SOLN
75.0000 mg/m2 | Freq: Once | INTRAVENOUS | Status: AC
  Administered 2020-11-13: 125 mg via INTRAVENOUS
  Filled 2020-11-13: qty 5

## 2020-11-13 MED ORDER — SODIUM CHLORIDE 0.9 % IV SOLN
Freq: Once | INTRAVENOUS | Status: AC
  Filled 2020-11-13: qty 250

## 2020-11-13 MED ORDER — HEPARIN SOD (PORK) LOCK FLUSH 100 UNIT/ML IV SOLN
500.0000 [IU] | Freq: Once | INTRAVENOUS | Status: AC | PRN
  Administered 2020-11-13: 500 [IU]
  Filled 2020-11-13: qty 5

## 2020-11-13 MED ORDER — SODIUM CHLORIDE 0.9% FLUSH
10.0000 mL | INTRAVENOUS | Status: DC | PRN
  Administered 2020-11-13: 10 mL
  Filled 2020-11-13: qty 10

## 2020-11-13 MED ORDER — PALONOSETRON HCL INJECTION 0.25 MG/5ML
INTRAVENOUS | Status: AC
  Filled 2020-11-13: qty 5

## 2020-11-13 MED ORDER — SODIUM CHLORIDE 0.9 % IV SOLN
10.0000 mg | Freq: Once | INTRAVENOUS | Status: AC
  Administered 2020-11-13: 10 mg via INTRAVENOUS
  Filled 2020-11-13: qty 10

## 2020-11-13 MED ORDER — PALONOSETRON HCL INJECTION 0.25 MG/5ML
0.2500 mg | Freq: Once | INTRAVENOUS | Status: AC
  Administered 2020-11-13: 0.25 mg via INTRAVENOUS

## 2020-11-13 NOTE — Patient Instructions (Signed)
Bendamustine Injection What is this medicine? BENDAMUSTINE (BEN da MUS teen) is a chemotherapy drug. It is used to treat chronic lymphocytic leukemia and non-Hodgkin lymphoma. This medicine may be used for other purposes; ask your health care provider or pharmacist if you have questions. COMMON BRAND NAME(S): Kristine Royal, Treanda What should I tell my health care provider before I take this medicine? They need to know if you have any of these conditions:  infection (especially a virus infection such as chickenpox, cold sores, or herpes)  kidney disease  liver disease  an unusual or allergic reaction to bendamustine, mannitol, other medicines, foods, dyes, or preservatives  pregnant or trying to get pregnant  breast-feeding How should I use this medicine? This medicine is for infusion into a vein. It is given by a health care professional in a hospital or clinic setting. Talk to your pediatrician regarding the use of this medicine in children. Special care may be needed. Overdosage: If you think you have taken too much of this medicine contact a poison control center or emergency room at once. NOTE: This medicine is only for you. Do not share this medicine with others. What if I miss a dose? It is important not to miss your dose. Call your doctor or health care professional if you are unable to keep an appointment. What may interact with this medicine? Do not take this medicine with any of the following medications:  clozapine This medicine may also interact with the following medications:  atazanavir  cimetidine  ciprofloxacin  enoxacin  fluvoxamine  medicines for seizures like carbamazepine and phenobarbital  mexiletine  rifampin  tacrine  thiabendazole  zileuton This list may not describe all possible interactions. Give your health care provider a list of all the medicines, herbs, non-prescription drugs, or dietary supplements you use. Also tell them if  you smoke, drink alcohol, or use illegal drugs. Some items may interact with your medicine. What should I watch for while using this medicine? This drug may make you feel generally unwell. This is not uncommon, as chemotherapy can affect healthy cells as well as cancer cells. Report any side effects. Continue your course of treatment even though you feel ill unless your doctor tells you to stop. You may need blood work done while you are taking this medicine. Call your doctor or healthcare provider for advice if you get a fever, chills or sore throat, or other symptoms of a cold or flu. Do not treat yourself. This drug decreases your body's ability to fight infections. Try to avoid being around people who are sick. This medicine may cause serious skin reactions. They can happen weeks to months after starting the medicine. Contact your healthcare provider right away if you notice fevers or flu-like symptoms with a rash. The rash may be red or purple and then turn into blisters or peeling of the skin. Or, you might notice a red rash with swelling of the face, lips or lymph nodes in your neck or under your arms. In some patients, this medicine may cause a serious brain infection that may cause death. If you have any problems seeing, thinking, speaking, walking, or standing, tell your health care provider right away. If you cannot reach your health care provider, urgently seek other source of medical care. This medicine may increase your risk to bruise or bleed. Call your doctor or healthcare provider if you notice any unusual bleeding. Talk to your doctor about your risk of cancer. You may be more at  risk for certain types of cancers if you take this medicine. This medicine may increase your risk of skin cancer. Check your skin for changes to moles or for new growths while taking this medicine. Call your health care provider if you notice any of these skin changes. Do not become pregnant while taking this  medicine or for at least 6 months after stopping it. Women should inform their doctor if they wish to become pregnant or think they might be pregnant. Men should not father a child while taking this medicine and for at least 3 months after stopping it. There is a potential for serious side effects to an unborn child. Talk to your healthcare provider or pharmacist for more information. Do not breast-feed an infant while taking this medicine or for at least 1 week after stopping it. This medicine may make it more difficult to father a child. You should talk with your doctor or healthcare provider if you are concerned about your fertility. What side effects may I notice from receiving this medicine? Side effects that you should report to your doctor or health care professional as soon as possible:  allergic reactions like skin rash, itching or hives, swelling of the face, lips, or tongue  low blood counts - this medicine may decrease the number of white blood cells, red blood cells and platelets. You may be at increased risk for infections and bleeding.  rash, fever, and swollen lymph nodes  redness, blistering, peeling, or loosening of the skin, including inside the mouth  signs of infection like fever or chills, cough, sore throat, pain or difficulty passing urine  signs of decreased platelets or bleeding like bruising, pinpoint red spots on the skin, black, tarry stools, blood in the urine  signs of decreased red blood cells like being unusually weak or tired, fainting spells, lightheadedness  signs and symptoms of kidney injury like trouble passing urine or change in the amount of urine  signs and symptoms of liver injury like dark yellow or brown urine; general ill feeling or flu-like symptoms; light-colored stools; loss of appetite; nausea; right upper belly pain; unusually weak or tired; yellowing of the eyes or skin Side effects that usually do not require medical attention (report to your  doctor or health care professional if they continue or are bothersome):  constipation  decreased appetite  diarrhea  headache  mouth sores  nausea, vomiting  tiredness This list may not describe all possible side effects. Call your doctor for medical advice about side effects. You may report side effects to FDA at 1-800-FDA-1088. Where should I keep my medicine? This drug is given in a hospital or clinic and will not be stored at home. NOTE: This sheet is a summary. It may not cover all possible information. If you have questions about this medicine, talk to your doctor, pharmacist, or health care provider.  2021 Elsevier/Gold Standard (2020-01-24 12:11:43)

## 2020-11-13 NOTE — Progress Notes (Signed)
Ok to treat with ANC 1.4 & creatinine 1.77 per Dr Marin Olp. dph

## 2020-11-16 ENCOUNTER — Other Ambulatory Visit: Payer: Self-pay | Admitting: Hematology & Oncology

## 2020-11-18 ENCOUNTER — Other Ambulatory Visit: Payer: Self-pay | Admitting: Hematology & Oncology

## 2020-11-18 DIAGNOSIS — G620 Drug-induced polyneuropathy: Secondary | ICD-10-CM

## 2020-11-18 LAB — CUP PACEART REMOTE DEVICE CHECK
Date Time Interrogation Session: 20220401230437
Implantable Pulse Generator Implant Date: 20200909

## 2020-11-20 ENCOUNTER — Ambulatory Visit (INDEPENDENT_AMBULATORY_CARE_PROVIDER_SITE_OTHER): Payer: Medicare HMO

## 2020-11-20 DIAGNOSIS — I639 Cerebral infarction, unspecified: Secondary | ICD-10-CM | POA: Diagnosis not present

## 2020-12-02 ENCOUNTER — Emergency Department (HOSPITAL_COMMUNITY)
Admission: EM | Admit: 2020-12-02 | Discharge: 2020-12-02 | Disposition: A | Discharge status: Home / Self Care | Payer: Medicare HMO | Attending: Emergency Medicine | Admitting: Emergency Medicine

## 2020-12-02 ENCOUNTER — Emergency Department (HOSPITAL_COMMUNITY): Payer: Medicare HMO

## 2020-12-02 ENCOUNTER — Other Ambulatory Visit: Payer: Self-pay

## 2020-12-02 DIAGNOSIS — Z96641 Presence of right artificial hip joint: Secondary | ICD-10-CM | POA: Insufficient documentation

## 2020-12-02 DIAGNOSIS — R1084 Generalized abdominal pain: Secondary | ICD-10-CM | POA: Insufficient documentation

## 2020-12-02 DIAGNOSIS — E860 Dehydration: Secondary | ICD-10-CM

## 2020-12-02 DIAGNOSIS — M25511 Pain in right shoulder: Secondary | ICD-10-CM

## 2020-12-02 DIAGNOSIS — Z7982 Long term (current) use of aspirin: Secondary | ICD-10-CM | POA: Insufficient documentation

## 2020-12-02 DIAGNOSIS — I129 Hypertensive chronic kidney disease with stage 1 through stage 4 chronic kidney disease, or unspecified chronic kidney disease: Secondary | ICD-10-CM | POA: Insufficient documentation

## 2020-12-02 DIAGNOSIS — N183 Chronic kidney disease, stage 3 unspecified: Secondary | ICD-10-CM | POA: Insufficient documentation

## 2020-12-02 DIAGNOSIS — Z79899 Other long term (current) drug therapy: Secondary | ICD-10-CM | POA: Insufficient documentation

## 2020-12-02 LAB — CBC WITH DIFFERENTIAL/PLATELET
Abs Immature Granulocytes: 0.41 10*3/uL — ABNORMAL HIGH (ref 0.00–0.07)
Basophils Absolute: 0.1 10*3/uL (ref 0.0–0.1)
Basophils Relative: 1 %
Eosinophils Absolute: 0 10*3/uL (ref 0.0–0.5)
Eosinophils Relative: 1 %
HCT: 26 % — ABNORMAL LOW (ref 39.0–52.0)
Hemoglobin: 8.2 g/dL — ABNORMAL LOW (ref 13.0–17.0)
Immature Granulocytes: 10 %
Lymphocytes Relative: 5 %
Lymphs Abs: 0.2 10*3/uL — ABNORMAL LOW (ref 0.7–4.0)
MCH: 29.4 pg (ref 26.0–34.0)
MCHC: 31.5 g/dL (ref 30.0–36.0)
MCV: 93.2 fL (ref 80.0–100.0)
Monocytes Absolute: 2 10*3/uL — ABNORMAL HIGH (ref 0.1–1.0)
Monocytes Relative: 47 %
Neutro Abs: 1.5 10*3/uL — ABNORMAL LOW (ref 1.7–7.7)
Neutrophils Relative %: 36 %
Platelets: 101 10*3/uL — ABNORMAL LOW (ref 150–400)
RBC: 2.79 MIL/uL — ABNORMAL LOW (ref 4.22–5.81)
RDW: 18.4 % — ABNORMAL HIGH (ref 11.5–15.5)
WBC: 4.3 10*3/uL (ref 4.0–10.5)
nRBC: 0 % (ref 0.0–0.2)

## 2020-12-02 LAB — COMPREHENSIVE METABOLIC PANEL
ALT: 16 U/L (ref 0–44)
AST: 21 U/L (ref 15–41)
Albumin: 2.6 g/dL — ABNORMAL LOW (ref 3.5–5.0)
Alkaline Phosphatase: 59 U/L (ref 38–126)
Anion gap: 9 (ref 5–15)
BUN: 57 mg/dL — ABNORMAL HIGH (ref 8–23)
CO2: 23 mmol/L (ref 22–32)
Calcium: 8.3 mg/dL — ABNORMAL LOW (ref 8.9–10.3)
Chloride: 107 mmol/L (ref 98–111)
Creatinine, Ser: 1.65 mg/dL — ABNORMAL HIGH (ref 0.61–1.24)
GFR, Estimated: 40 mL/min — ABNORMAL LOW (ref >60)
Glucose, Bld: 98 mg/dL (ref 70–99)
Potassium: 4 mmol/L (ref 3.5–5.1)
Sodium: 139 mmol/L (ref 135–145)
Total Bilirubin: 0.6 mg/dL (ref 0.3–1.2)
Total Protein: 5.9 g/dL — ABNORMAL LOW (ref 6.5–8.1)

## 2020-12-02 LAB — URINALYSIS, ROUTINE W REFLEX MICROSCOPIC
Bacteria, UA: NONE SEEN
Bilirubin Urine: NEGATIVE
Glucose, UA: 50 mg/dL — AB
Hgb urine dipstick: NEGATIVE
Ketones, ur: NEGATIVE mg/dL
Leukocytes,Ua: NEGATIVE
Nitrite: NEGATIVE
Protein, ur: 100 mg/dL — AB
Specific Gravity, Urine: 1.013 (ref 1.005–1.030)
pH: 5 (ref 5.0–8.0)

## 2020-12-02 MED ORDER — HEPARIN SOD (PORK) LOCK FLUSH 100 UNIT/ML IV SOLN
500.0000 [IU] | Freq: Once | INTRAVENOUS | Status: AC
  Administered 2020-12-02: 500 [IU]
  Filled 2020-12-02: qty 5

## 2020-12-02 NOTE — Discharge Instructions (Addendum)
There were no serious problems found to explain your discomfort in the shoulders and abdomen.  You do appear to be mildly dehydrated.  Try to drink at least 1 L of water every day and eat 3 good meals.  If you start to feel dizzy or weak, sit down.  You can take Tylenol, 650 mg, every 4 hours if needed for pain.  Follow-up with your primary care doctor for checkup next week.

## 2020-12-02 NOTE — ED Triage Notes (Signed)
Pt came from [facility] via EMS. Pt reports 4/10 bilateral lower abdominal pain that began today. Pt denies N/V/D or pain or burning with urination. Caren Macadam is concerned about bilateral shoulder pain. Pt ambulatory with EMS. Uses a walker normally to ambulate

## 2020-12-02 NOTE — ED Notes (Signed)
Patient ambulated with staff assistance, did not report any feelings of dizziness, weakness, or unsteadiness.

## 2020-12-02 NOTE — ED Provider Notes (Signed)
[facility] DEPT Provider Note   CSN: [id number] Arrival date & time: 12/02/20  1109     History Chief Complaint  Patient presents with  . Abdominal Pain    Christian Burns is a 87 y.o. male.  HPI Patient here for evaluation of abdominal discomfort.-  She is actively receiving chemotherapy for myeloma and support of Red cell production, and has had radiation therapy for plasmacytoma.  He recently had Mohs surgery on nose and skin graft to the left ear.  He has ongoing osteomyelitis of the left jaw.  He presents for abdominal pain which began today which is diffuse.  He also has some shoulder pain, several weeks ago but that has gotten better.  He reports having poor appetite for several weeks.  He states he is having daily regular bowel movements and chronically takes Imodium.  There are no other known active modifying factors.  Past Medical History:  Diagnosis Date  . Anemia   . Blood transfusion without reported diagnosis   . CKD (chronic kidney disease) 07/06/2018   CKD stage III  . Goals of care, counseling/discussion 02/04/2019  . History of radiation therapy 06/17/13-07/06/13   35 gray to upper lumbar spine  . 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 Hypertension   . Inguinal hernia   . Multiple myeloma [facility]) Sept 2010  . Multiple myeloma in relapse ([place]) 04/05/2016  . Radiation 09/26/15-10/20/15   lower thoracic spine, upper lumbar spine 30 gray  . Stroke [facility])     Patient Active Problem List   Diagnosis Date Noted  . Aspiration pneumonitis ([place]) 09/28/2020  . Pancytopenia ([place]) 09/28/2020  . Dysphagia 09/20/2020  . SIRS (systemic inflammatory response syndrome) ([place]) 09/28/2019  . Multifocal pneumonia 09/28/2019  . Anemia associated with chemotherapy 09/28/2019  . Protein-calorie malnutrition, severe 06/24/2019  . Leukopenia due to antineoplastic chemotherapy ([place]) 06/23/2019  . Nausea 06/23/2019  . Diarrhea 06/23/2019  . CKD  (chronic kidney disease), stage III ([place]) 06/23/2019  . HCAP (healthcare-associated pneumonia) 06/22/2019  . Hyperlipidemia LDL goal <70 04/20/2019  . Essential hypertension 04/20/2019  . Advanced age 26/03/2019  . Stroke ([place]) 04/19/2019  . Community acquired pneumonia 03/03/2019  . AKI (acute kidney injury) ([place]) 03/03/2019  . Normochromic normocytic anemia 03/03/2019  . Goals of care, counseling/discussion 02/04/2019  . Acute osteomyelitis of jaw 11/26/2018  . Bacteremia   . 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 Sepsis due to undetermined organism ([place])   . Left lower lobe pneumonia 10/06/2018  . Right hip pain 09/01/2018  . Iron deficiency anemia secondary to inadequate dietary iron intake 07/18/2016  . Multiple myeloma in relapse ([place]) 04/05/2016  . 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 Multiple myeloma in remission ([place]) 09/08/2015  . Myeloma ([place]) 08/23/2011    Past Surgical History:  Procedure Laterality Date  . COLONOSCOPY    . HIP PINNING,CANNULATED Right 09/01/2018   Procedure: [facility] NAIL HIP PINNING;  Surgeon: Melrose Nakayama, MD;  Location: [facility];  Service: Orthopedics;  Laterality: Right;  . IR IMAGING GUIDED PORT INSERTION  11/01/2019  . LOOP RECORDER INSERTION N/A 04/21/2019   Procedure: LOOP RECORDER INSERTION;  Surgeon: Evans Lance, MD;  Location: [facility] CV LAB;  Service: Cardiovascular;  Laterality: N/A;  . TEE WITHOUT CARDIOVERSION N/A 10/09/2018   Procedure: TRANSESOPHAGEAL ECHOCARDIOGRAM (TEE);  Surgeon: Acie Fredrickson Wonda Cheng, MD;  Location: [facility];  Service: Cardiovascular;  Laterality: N/A;  . TONSILLECTOMY  Family History  Problem Relation Age of Onset  . Hypertension Mother   . Hypertension Father   . Colon cancer Neg Hx   . Esophageal cancer Neg Hx   . Cancer Neg Hx   . Rectal cancer Neg Hx   . Stomach cancer Neg Hx     Social History   Tobacco Use  . Smoking status: Never Smoker  . Smokeless  tobacco: Never Used  . Tobacco comment: never used tobacco  Vaping Use  . Vaping Use: Never used  Substance Use Topics  . Alcohol use: Not Currently    Alcohol/week: 4.0 standard drinks    Types: 4 Glasses of wine per week  . Drug use: No    Home Medications Prior to Admission medications   Medication Sig Start Date End Date Taking? Authorizing Provider  amLODipine-benazepril (LOTREL) 5-10 MG capsule TAKE ONE CAPSULE EACH DAY 10/18/20   [name], MD  aspirin EC 81 MG tablet Take 1 tablet (81 mg total) by mouth daily. Swallow whole. 03/15/20   Frann Rider, NP  dronabinol (MARINOL) 5 MG capsule TAKE ONE CAPSULE TWICE A DAY BEFORE A MEAL 05/24/20   [name], MD  DULoxetine (CYMBALTA) 60 MG capsule TAKE ONE CAPSULE EACH DAY 11/20/20   [name], MD  finasteride (PROSCAR) 5 MG tablet TAKE ONE TABLET EACH DAY Patient taking differently: Take 5 mg by mouth daily. 04/22/19   [name], MD  lidocaine-prilocaine (EMLA) cream Apply 1 application topically as needed. 11/05/19   [name], MD  loperamide (IMODIUM A-D) 2 MG tablet Take 2 mg by mouth daily.    [provider]  metoprolol succinate (TOPROL-XL) 25 MG 24 hr tablet Take 25 mg by mouth daily. 02/25/20   [provider]  ondansetron (ZOFRAN) 4 MG tablet Take 1 tablet (4 mg total) by mouth every 6 (six) hours as needed for nausea. 10/04/19   Oswald Hillock, MD  pantoprazole (PROTONIX) 40 MG tablet TAKE ONE TABLET TWICE DAILY 11/16/20   [name], MD  penicillin v potassium (VEETID) 500 MG tablet TAKE ONE TABLET FOUR TIMES DAILY 10/17/20   Carlyle Basques, MD  polyvinyl alcohol (LIQUIFILM TEARS) 1.4 % ophthalmic solution Place 1 drop into both eyes as needed for dry eyes. 03/05/19   Swayze, Ava, DO  predniSONE (DELTASONE) 20 MG tablet TAKE ONE TABLET EACH DAY WITH BREAKFAST 11/08/20   [name], MD  Probiotic Product (PROBIOTIC PO) Take 1 capsule by mouth daily.    [provider]   pyridOXINE (VITAMIN B-6) 100 MG tablet Take 100 mg by mouth daily.  09/17/13   [name], MD  vitamin B-12 (CYANOCOBALAMIN) 500 MCG tablet Take 500 mcg by mouth daily.    [provider]  Vitamin D, Ergocalciferol, (DRISDOL) 50000 UNITS CAPS capsule Take 50,000 Units by mouth every Sunday.     [provider]  White Petrolatum-Mineral Oil (EYE LUBRICANT) OINT Apply 1 application to eye in the morning and at bedtime. If left eye lower eye lid is irritated 06/07/20   Carlyle Basques, MD    Allergies    Sulfa antibiotics and Sulfasalazine  Review of Systems   Review of Systems  All other systems reviewed and are negative.   Physical Exam Updated Vital Signs BP (!) 164/104   Pulse 84   Temp 98.4 F (36.9 C) (Oral)   Resp 15   Ht _0  (1.778 m)   Wt 54.4 kg  SpO2 100%   BMI 17.22 kg/m   Physical Exam Vitals and nursing note reviewed.  Constitutional:      General: He is not in acute distress.    Appearance: He is well-developed. He is not ill-appearing, toxic-appearing or diaphoretic.  HENT:     Head: Normocephalic and atraumatic.     Right Ear: External ear normal.     Left Ear: External ear normal.  Eyes:     Conjunctiva/sclera: Conjunctivae normal.     Pupils: Pupils are equal, round, and reactive to light.  Neck:     Trachea: Phonation normal.  Cardiovascular:     Rate and Rhythm: Normal rate and regular rhythm.     Heart sounds: Normal heart sounds.  Pulmonary:     Effort: Pulmonary effort is normal.     Breath sounds: Normal breath sounds.  Abdominal:     General: There is no distension.     Palpations: Abdomen is soft. There is no mass.     Tenderness: There is abdominal tenderness (Diffuse, mild). There is no guarding.     Hernia: A hernia (Right groin, mild) is present.  Musculoskeletal:        General: No swelling or tenderness. Normal range of motion.     Cervical back: Normal range of motion and neck supple.  Skin:    General:  Skin is warm and dry.  Neurological:     Mental Status: He is alert and oriented to person, place, and time.     Cranial Nerves: No cranial nerve deficit.     Sensory: No sensory deficit.     Motor: No abnormal muscle tone.     Coordination: Coordination normal.  Psychiatric:        Mood and Affect: Mood normal.        Behavior: Behavior normal.        Thought Content: Thought content normal.        Judgment: Judgment normal.     ED Results / Procedures / Treatments   Labs (all labs ordered are listed, but only abnormal results are displayed) Labs Reviewed  COMPREHENSIVE METABOLIC PANEL - Abnormal; Notable for the following components:      Result Value   BUN 57 (*)    Creatinine, Ser 1.65 (*)    Calcium 8.3 (*)    Total Protein 5.9 (*)    Albumin 2.6 (*)    GFR, Estimated 40 (*)    All other components within normal limits  CBC WITH DIFFERENTIAL/PLATELET - Abnormal; Notable for the following components:   RBC 2.79 (*)    Hemoglobin 8.2 (*)    HCT 26.0 (*)    RDW 18.4 (*)    Platelets 101 (*)    Neutro Abs 1.5 (*)    Lymphs Abs 0.2 (*)    Monocytes Absolute 2.0 (*)    Abs Immature Granulocytes 0.41 (*)    All other components within normal limits  URINALYSIS, ROUTINE W REFLEX MICROSCOPIC - Abnormal; Notable for the following components:   Glucose, UA 50 (*)    Protein, ur 100 (*)    All other components within normal limits    EKG None  Radiology DG ABD ACUTE 2+V W 1V CHEST  Result Date: 12/02/2020 CLINICAL DATA:  Pain, lower abdominal tenderness. EXAM: DG ABDOMEN ACUTE WITH 1 VIEW CHEST COMPARISON:  Chest radiograph dated 09/27/2020. CT abdomen pelvis dated 06/08/2013. FINDINGS: There is no evidence of dilated bowel loops or free intraperitoneal air. No radiopaque  calculi or other significant radiographic abnormality is seen. Heart size and mediastinal contours are within normal limits. Both lungs are clear. A right internal jugular central venous port catheter tip  overlies the superior cavoatrial junction. A cardiac loop recorder overlies the left chest. Vertebroplasty changes are seen in at L2. Degenerative changes are seen in the spine and right greater than left hips. The patient is status fixation of the proximal right femur, partially imaged. IMPRESSION: Nonobstructive bowel gas pattern.  No acute cardiopulmonary disease. Electronically Signed   By: Zerita Boers M.D.   On: 12/02/2020 12:53    Procedures Procedures   Medications Ordered in ED Medications - No data to display  ED Course  I have reviewed the triage vital signs and the nursing notes.  Pertinent labs & imaging results that were available during my care of the patient were reviewed by me and considered in my medical decision making (see chart for details).  Clinical Course as of 12/02/20 1417  Sat Dec 02, 2020  1340 DG ABD ACUTE 2+V W 1V CHEST [EW]  1342 Positive there is patient's son, [name], who gave similar history as to what the patient gave me earlier.  [name] does not have any other questions or concerns.  I informed him that the patient will be ready to go home soon.  [name] states that his son will come to pick up the patient. [EW]    Clinical Course User Index [EW] Daleen Bo, MD   MDM Rules/Calculators/A&P                           Patient Vitals for the past 24 hrs:  BP Temp Temp src Pulse Resp SpO2 Height Weight  12/02/20 1323 (!) 164/104 -- -- 84 15 100 % -- --  12/02/20 1215 (!) 183/93 -- -- 78 18 100 % -- --  12/02/20 1127 -- -- -- -- -- -- _0  (1.778 m) 54.4 kg  12/02/20 1126 -- -- -- -- 16 100 % -- --  12/02/20 1120 (!) 170/74 98.4 F (36.9 C) Oral 84 -- 100 % -- --    2:17 PM Reevaluation with update and discussion. After initial assessment and treatment, an updated evaluation reveals no further complaints, findings discussed and questions answered. Daleen Bo   Medical Decision Making:  This patient is presenting for evaluation of abdominal pain and  shoulder pain, which does require a range of treatment options, and is a complaint that involves a moderate risk of morbidity and mortality. The differential diagnoses include constipation, intestinal disorder, complications of myeloma. I decided to review old records, and in summary elderly male, arriving from home, presenting for abdominal and shoulder pain.  I obtained additional historical information from patient son/legal guardian, [name] via telephone.  Clinical Laboratory Tests Ordered, included CBC, Metabolic panel and Urinalysis. Review indicates normal except hemoglobin low, platelets low, BUN high, creatinine high, calcium low, total protein low, albumin low, GFR low, protein in urine. Radiologic Tests Ordered, included three-way abdomen.  I independently Visualized: Radiograph images, which show no acute abnormality   I ordered and Reviewed the orthostatic vital signs medicine Test. Patient with evidence for orthostasis, mild.  Critical Interventions-evaluation, laboratory testing, observation, orthostatics, ambulation trial, discussion with family members  After These Interventions, the Patient was reevaluated and was found stable for discharge.  Patient with nonspecific pain shoulders and abdomen, with reassuring evaluation.  Appears to have mild dehydration causing some orthostasis.  No  reported presyncope or syncope.  He is stable for discharge with outpatient management.  Instructed to increase oral fluid intake and follow-up with PCP next week.  CRITICAL CARE-no Performed by: Daleen Bo  Nursing Notes Reviewed/ Care Coordinated Applicable Imaging Reviewed Interpretation of Laboratory Data incorporated into ED treatment  The patient appears reasonably screened and/or stabilized for discharge and I doubt any other medical condition or other [facility] requiring further screening, evaluation, or treatment in the ED at this time prior to discharge.  Plan: Home Medications-continue  usual; Home Treatments-increase oral fluids and push diet; return here if the recommended treatment, does not improve the symptoms; Recommended follow up-PCP checkup next week and as needed     Final Clinical Impression(s) / ED Diagnoses Final diagnoses:  Generalized abdominal pain  Bilateral shoulder pain, unspecified chronicity  Dehydration    Rx / DC Orders ED Discharge Orders    None       Daleen Bo, MD 12/02/20 1419

## 2020-12-04 NOTE — Progress Notes (Signed)
Carelink Summary Report / Loop Recorder 

## 2020-12-06 ENCOUNTER — Inpatient Hospital Stay (HOSPITAL_COMMUNITY)
Admission: EM | Admit: 2020-12-06 | Discharge: 2020-12-09 | DRG: 871 | Disposition: A | Discharge status: Hospice | Payer: Medicare HMO | Attending: Internal Medicine | Admitting: Internal Medicine

## 2020-12-06 ENCOUNTER — Encounter (HOSPITAL_COMMUNITY): Payer: Self-pay | Admitting: Internal Medicine

## 2020-12-06 ENCOUNTER — Emergency Department (HOSPITAL_COMMUNITY): Payer: Medicare HMO

## 2020-12-06 ENCOUNTER — Other Ambulatory Visit: Payer: Self-pay

## 2020-12-06 DIAGNOSIS — N400 Enlarged prostate without lower urinary tract symptoms: Secondary | ICD-10-CM | POA: Diagnosis present

## 2020-12-06 DIAGNOSIS — R0902 Hypoxemia: Secondary | ICD-10-CM | POA: Diagnosis present

## 2020-12-06 DIAGNOSIS — R079 Chest pain, unspecified: Secondary | ICD-10-CM | POA: Diagnosis not present

## 2020-12-06 DIAGNOSIS — D6481 Anemia due to antineoplastic chemotherapy: Secondary | ICD-10-CM | POA: Diagnosis not present

## 2020-12-06 DIAGNOSIS — A419 Sepsis, unspecified organism: Secondary | ICD-10-CM | POA: Diagnosis not present

## 2020-12-06 DIAGNOSIS — I129 Hypertensive chronic kidney disease with stage 1 through stage 4 chronic kidney disease, or unspecified chronic kidney disease: Secondary | ICD-10-CM | POA: Diagnosis present

## 2020-12-06 DIAGNOSIS — E872 Acidosis, unspecified: Secondary | ICD-10-CM | POA: Diagnosis present

## 2020-12-06 DIAGNOSIS — Z20822 Contact with and (suspected) exposure to covid-19: Secondary | ICD-10-CM | POA: Diagnosis present

## 2020-12-06 DIAGNOSIS — Z8249 Family history of ischemic heart disease and other diseases of the circulatory system: Secondary | ICD-10-CM | POA: Diagnosis not present

## 2020-12-06 DIAGNOSIS — Z79899 Other long term (current) drug therapy: Secondary | ICD-10-CM

## 2020-12-06 DIAGNOSIS — C9002 Multiple myeloma in relapse: Secondary | ICD-10-CM

## 2020-12-06 DIAGNOSIS — R6520 Severe sepsis without septic shock: Secondary | ICD-10-CM

## 2020-12-06 DIAGNOSIS — H5789 Other specified disorders of eye and adnexa: Secondary | ICD-10-CM | POA: Diagnosis present

## 2020-12-06 DIAGNOSIS — J189 Pneumonia, unspecified organism: Secondary | ICD-10-CM | POA: Diagnosis present

## 2020-12-06 DIAGNOSIS — A4189 Other specified sepsis: Secondary | ICD-10-CM | POA: Diagnosis not present

## 2020-12-06 DIAGNOSIS — Z681 Body mass index (BMI) 19 or less, adult: Secondary | ICD-10-CM

## 2020-12-06 DIAGNOSIS — N1831 Chronic kidney disease, stage 3a: Secondary | ICD-10-CM | POA: Diagnosis present

## 2020-12-06 DIAGNOSIS — Z96641 Presence of right artificial hip joint: Secondary | ICD-10-CM | POA: Diagnosis present

## 2020-12-06 DIAGNOSIS — D631 Anemia in chronic kidney disease: Secondary | ICD-10-CM | POA: Diagnosis present

## 2020-12-06 DIAGNOSIS — N179 Acute kidney failure, unspecified: Secondary | ICD-10-CM | POA: Diagnosis present

## 2020-12-06 DIAGNOSIS — R000 Tachycardia, unspecified: Secondary | ICD-10-CM | POA: Diagnosis present

## 2020-12-06 DIAGNOSIS — Z8673 Personal history of transient ischemic attack (TIA), and cerebral infarction without residual deficits: Secondary | ICD-10-CM

## 2020-12-06 DIAGNOSIS — C9000 Multiple myeloma not having achieved remission: Secondary | ICD-10-CM | POA: Diagnosis present

## 2020-12-06 DIAGNOSIS — Z7982 Long term (current) use of aspirin: Secondary | ICD-10-CM

## 2020-12-06 DIAGNOSIS — R0602 Shortness of breath: Secondary | ICD-10-CM | POA: Diagnosis not present

## 2020-12-06 DIAGNOSIS — Z7952 Long term (current) use of systemic steroids: Secondary | ICD-10-CM

## 2020-12-06 DIAGNOSIS — R1310 Dysphagia, unspecified: Secondary | ICD-10-CM | POA: Diagnosis not present

## 2020-12-06 DIAGNOSIS — Z95828 Presence of other vascular implants and grafts: Secondary | ICD-10-CM

## 2020-12-06 DIAGNOSIS — Z923 Personal history of irradiation: Secondary | ICD-10-CM

## 2020-12-06 DIAGNOSIS — H109 Unspecified conjunctivitis: Secondary | ICD-10-CM | POA: Diagnosis present

## 2020-12-06 DIAGNOSIS — Z66 Do not resuscitate: Secondary | ICD-10-CM | POA: Diagnosis present

## 2020-12-06 DIAGNOSIS — R54 Age-related physical debility: Secondary | ICD-10-CM | POA: Diagnosis present

## 2020-12-06 DIAGNOSIS — D696 Thrombocytopenia, unspecified: Secondary | ICD-10-CM | POA: Diagnosis present

## 2020-12-06 DIAGNOSIS — M7989 Other specified soft tissue disorders: Secondary | ICD-10-CM | POA: Diagnosis not present

## 2020-12-06 DIAGNOSIS — T451X5A Adverse effect of antineoplastic and immunosuppressive drugs, initial encounter: Secondary | ICD-10-CM | POA: Diagnosis present

## 2020-12-06 DIAGNOSIS — R7989 Other specified abnormal findings of blood chemistry: Secondary | ICD-10-CM | POA: Diagnosis not present

## 2020-12-06 DIAGNOSIS — B308 Other viral conjunctivitis: Secondary | ICD-10-CM

## 2020-12-06 LAB — RESP PANEL BY RT-PCR (FLU A&B, COVID) ARPGX2
Influenza A by PCR: NEGATIVE
Influenza B by PCR: NEGATIVE
SARS Coronavirus 2 by RT PCR: NEGATIVE

## 2020-12-06 LAB — COMPREHENSIVE METABOLIC PANEL
ALT: 16 U/L (ref 0–44)
AST: 50 U/L — ABNORMAL HIGH (ref 15–41)
Albumin: 2.1 g/dL — ABNORMAL LOW (ref 3.5–5.0)
Alkaline Phosphatase: 74 U/L (ref 38–126)
Anion gap: 16 — ABNORMAL HIGH (ref 5–15)
BUN: 88 mg/dL — ABNORMAL HIGH (ref 8–23)
CO2: 19 mmol/L — ABNORMAL LOW (ref 22–32)
Calcium: 7.9 mg/dL — ABNORMAL LOW (ref 8.9–10.3)
Chloride: 104 mmol/L (ref 98–111)
Creatinine, Ser: 2.98 mg/dL — ABNORMAL HIGH (ref 0.61–1.24)
GFR, Estimated: 20 mL/min — ABNORMAL LOW (ref >60)
Glucose, Bld: 99 mg/dL (ref 70–99)
Potassium: 5 mmol/L (ref 3.5–5.1)
Sodium: 139 mmol/L (ref 135–145)
Total Bilirubin: 0.8 mg/dL (ref 0.3–1.2)
Total Protein: 5.1 g/dL — ABNORMAL LOW (ref 6.5–8.1)

## 2020-12-06 LAB — URINALYSIS, ROUTINE W REFLEX MICROSCOPIC
Bilirubin Urine: NEGATIVE
Glucose, UA: NEGATIVE mg/dL
Ketones, ur: NEGATIVE mg/dL
Leukocytes,Ua: NEGATIVE
Nitrite: NEGATIVE
Protein, ur: 100 mg/dL — AB
Specific Gravity, Urine: 1.009 (ref 1.005–1.030)
pH: 5 (ref 5.0–8.0)

## 2020-12-06 LAB — CBC WITH DIFFERENTIAL/PLATELET
Abs Immature Granulocytes: 0.6 10*3/uL — ABNORMAL HIGH (ref 0.00–0.07)
Basophils Absolute: 0.1 10*3/uL (ref 0.0–0.1)
Basophils Relative: 1 %
Eosinophils Absolute: 0 10*3/uL (ref 0.0–0.5)
Eosinophils Relative: 0 %
HCT: 27 % — ABNORMAL LOW (ref 39.0–52.0)
Hemoglobin: 8.4 g/dL — ABNORMAL LOW (ref 13.0–17.0)
Immature Granulocytes: 16 %
Lymphocytes Relative: 2 %
Lymphs Abs: 0.1 10*3/uL — ABNORMAL LOW (ref 0.7–4.0)
MCH: 29.4 pg (ref 26.0–34.0)
MCHC: 31.1 g/dL (ref 30.0–36.0)
MCV: 94.4 fL (ref 80.0–100.0)
Monocytes Absolute: 1.3 10*3/uL — ABNORMAL HIGH (ref 0.1–1.0)
Monocytes Relative: 36 %
Neutro Abs: 1.6 10*3/uL — ABNORMAL LOW (ref 1.7–7.7)
Neutrophils Relative %: 45 %
Platelets: 93 10*3/uL — ABNORMAL LOW (ref 150–400)
RBC: 2.86 MIL/uL — ABNORMAL LOW (ref 4.22–5.81)
RDW: 18.1 % — ABNORMAL HIGH (ref 11.5–15.5)
WBC: 3.7 10*3/uL — ABNORMAL LOW (ref 4.0–10.5)
nRBC: 0.5 % — ABNORMAL HIGH (ref 0.0–0.2)

## 2020-12-06 LAB — LACTIC ACID, PLASMA
Lactic Acid, Venous: 3.2 mmol/L (ref 0.5–1.9)
Lactic Acid, Venous: 1.4 mmol/L (ref 0.5–1.9)

## 2020-12-06 LAB — I-STAT ARTERIAL BLOOD GAS, ED
Acid-base deficit: 4 mmol/L — ABNORMAL HIGH (ref 0.0–2.0)
Bicarbonate: 19.7 mmol/L — ABNORMAL LOW (ref 20.0–28.0)
Calcium, Ion: 1.13 mmol/L — ABNORMAL LOW (ref 1.15–1.40)
HCT: 23 % — ABNORMAL LOW (ref 39.0–52.0)
Hemoglobin: 7.8 g/dL — ABNORMAL LOW (ref 13.0–17.0)
O2 Saturation: 100 %
Patient temperature: 104.6
Potassium: 4.7 mmol/L (ref 3.5–5.1)
Sodium: 138 mmol/L (ref 135–145)
TCO2: 21 mmol/L — ABNORMAL LOW (ref 22–32)
pCO2 arterial: 33 mmHg (ref 32.0–48.0)
pH, Arterial: 7.397 (ref 7.350–7.450)
pO2, Arterial: 366 mmHg — ABNORMAL HIGH (ref 83.0–108.0)

## 2020-12-06 LAB — CK: Total CK: 63 U/L (ref 49–397)

## 2020-12-06 LAB — APTT: aPTT: 33 seconds (ref 24–36)

## 2020-12-06 LAB — PROTIME-INR
INR: 1.2 (ref 0.8–1.2)
Prothrombin Time: 15.1 seconds (ref 11.4–15.2)

## 2020-12-06 MED ORDER — LACTATED RINGERS IV BOLUS (SEPSIS)
500.0000 mL | Freq: Once | INTRAVENOUS | Status: AC
  Administered 2020-12-06: 500 mL via INTRAVENOUS

## 2020-12-06 MED ORDER — LACTATED RINGERS IV BOLUS (SEPSIS)
1000.0000 mL | Freq: Once | INTRAVENOUS | Status: AC
  Administered 2020-12-06: 1000 mL via INTRAVENOUS

## 2020-12-06 MED ORDER — LACTATED RINGERS IV SOLN: INTRAVENOUS | Status: DC

## 2020-12-06 MED ORDER — ACETAMINOPHEN 325 MG PO TABS
650.0000 mg | ORAL_TABLET | Freq: Four times a day (QID) | ORAL | Status: DC | PRN
  Administered 2020-12-07 – 2020-12-09 (×3): 650 mg via ORAL
  Filled 2020-12-06 (×4): qty 2

## 2020-12-06 MED ORDER — LACTATED RINGERS IV SOLN: INTRAVENOUS | Status: AC

## 2020-12-06 MED ORDER — VANCOMYCIN VARIABLE DOSE PER UNSTABLE RENAL FUNCTION (PHARMACIST DOSING): Status: DC

## 2020-12-06 MED ORDER — SODIUM CHLORIDE 0.9 % IV SOLN
2.0000 g | Freq: Once | INTRAVENOUS | Status: AC
  Administered 2020-12-06: 2 g via INTRAVENOUS
  Filled 2020-12-06: qty 2

## 2020-12-06 MED ORDER — SODIUM CHLORIDE 0.9 % IV SOLN
2.0000 g | INTRAVENOUS | Status: DC
  Administered 2020-12-07: 2 g via INTRAVENOUS
  Filled 2020-12-06: qty 2

## 2020-12-06 MED ORDER — SODIUM CHLORIDE 0.9 % IV BOLUS
500.0000 mL | Freq: Once | INTRAVENOUS | Status: AC
  Administered 2020-12-06: 500 mL via INTRAVENOUS

## 2020-12-06 MED ORDER — LACTATED RINGERS IV BOLUS (SEPSIS)
250.0000 mL | Freq: Once | INTRAVENOUS | Status: AC
  Administered 2020-12-06: 250 mL via INTRAVENOUS

## 2020-12-06 MED ORDER — VANCOMYCIN HCL 1000 MG/200ML IV SOLN
1000.0000 mg | Freq: Once | INTRAVENOUS | Status: AC
  Administered 2020-12-06: 1000 mg via INTRAVENOUS
  Filled 2020-12-06: qty 200

## 2020-12-06 MED ORDER — ACETAMINOPHEN 500 MG PO TABS
1000.0000 mg | ORAL_TABLET | Freq: Once | ORAL | Status: AC
  Administered 2020-12-06: 1000 mg via ORAL
  Filled 2020-12-06: qty 2

## 2020-12-06 MED ORDER — ACETAMINOPHEN 650 MG RE SUPP: 650.0000 mg | Freq: Four times a day (QID) | RECTAL | Status: DC | PRN

## 2020-12-06 NOTE — Progress Notes (Signed)
Brief note regarding plan, with full H&P to follow:  87 year old male with history of multiple Aloma, who is admitted for severe sepsis due to right upper lobe pneumonia at presenting from home to [facility] emergency department complaining of 1 day of shortness of breath a/w non-productive cough and subjective fever.  Elevated initial lactate at 3.2. on IVF's, with repeat LA pending.  In the setting of severe sepsis, started on broad spectrum antibiotics in the form of IV vancomycin and cefepime. MRSA PCR result pending at this time. DNR/DNI.  Of note, the patient failed his nursing bedside swallow screen.  Consequently, he is currently n.p.o.      Babs Bertin, DO Hospitalist

## 2020-12-06 NOTE — ED Notes (Signed)
Admitting at bedside 

## 2020-12-06 NOTE — ED Notes (Signed)
Updated pt's grandson, [name].

## 2020-12-06 NOTE — ED Triage Notes (Signed)
Pt here via [facility] from [facility] assisted living.  Sudden onset sob at 0430 this am.  HR 150, sats of 87% that increased to 90% on a NRB.  Normally ao x 4.

## 2020-12-06 NOTE — Sepsis Progress Note (Signed)
Sepsis protocol is being monitored by [facility]. 

## 2020-12-06 NOTE — Progress Notes (Signed)
SATs not correlating with pt's initial assessment. Pt's skin pink/pale, warm to touch. No cyanosis noted. Slight crackles heard in RUL, cleared with cough. Sputum clear/white/frothy/thick. GAG reflex WNL, ABG collected to titrate pt off NRB. Pt now on 4L [place] and tolerating well. Pt's has a lot of tremors in his extremities and SAT probe may not accurately pick up at this time.   Results for Christian Burns, Christian Burns (MRN [id number]) as of 12/06/2020 10:13  Ref. Range 12/06/2020 10:05  Sample type Unknown ARTERIAL  pH, Arterial Latest Ref Range: 7.350 - 7.450  7.397  pCO2 arterial Latest Ref Range: 32.0 - 48.0 mmHg 33.0  pO2, Arterial Latest Ref Range: 83.0 - 108.0 mmHg 366 (H)  TCO2 Latest Ref Range: 22 - 32 mmol/L 21 (L)  Acid-base deficit Latest Ref Range: 0.0 - 2.0 mmol/L 4.0 (H)  Bicarbonate Latest Ref Range: 20.0 - 28.0 mmol/L 19.7 (L)  O2 Saturation Latest Units: % 100.0  Patient temperature Unknown 104.6 F

## 2020-12-06 NOTE — H&P (Signed)
History and Physical    PLEASE NOTE THAT DRAGON DICTATION SOFTWARE WAS USED IN THE CONSTRUCTION OF THIS NOTE.   Christian Burns MOL:[id number] DOB: Jan 30, 1930 DOA: 12/06/2020  PCP: Leanna Battles, MD Patient coming from: home   I have personally briefly reviewed patient's old medical records in [place]  Chief Complaint: Shortness of breath  HPI: Christian Burns is a 87 y.o. male with medical history significant for multiple myeloma complicated by stage IIIa chronic kidney disease with baseline creatinine 1.5-1.8, chronic anemia with baseline hemoglobin 8-10, hypertension, BPH, who is admitted to [facility] on 12/06/2020 with severe sepsis due to right upper lobe pneumonia after presenting from home to [facility] ED complaining of shortness of breath.   The patient reports 1 to 2 days of progressive shortness of breath associated with new onset nonproductive cough.  Associated subjective fever over that timeframe, in the absence of any chills, full body rigors, or generalized myalgias.  Denies any associated orthopnea, PND, or new onset peripheral edema.  Denies any associated chest pain, diaphoresis, palpitations, nausea,, presyncope, or syncope.  Not associate with any wheezing, as, calf tenderness, or new lower extremity erythema.  No recent trauma, travel, or known COVID-19 exposures.  He also denies any recent melena or hematochezia. Denies any recent N/V. No recent headache, neck stiffness, abdominal pain, diarrhea, or rash.  No recent dysuria, gross hematuria, or change in urinary urgency/frequency.  Medical history notable for multiple myeloma for which she is receiving infusions via Port-A-Cath as an outpatient.  The patient reports that he follows with Dr. Marin Olp as his outpatient heme-onc physician, and that he is scheduled for next follow-up with Dr. Marin Olp as well as next infusion tomorrow (12/07/20).  The patient requests assistance with rescheduling of this infusion and  appointment with Dr. Marin Olp.      ED Course:  Vital signs in the ED were notable for the following: Initial temperature found to be 104.6, with subsequent improvement to 98.0; heart rate initially noted to be 115, which decreased to 96 following interval IV fluids, as further described below; blood pressure 107/63 -125/79; respiratory rate 18-25, oxygen saturation 97 to 100% on room air.  Labs were notable for the following: CMP was notable for the following: BUN 88, creatinine 2.98 relative to most recent prior value 1.65 on 12/02/2020.  CBC notable for white blood cell count of 3700 with absolute neutrophil count of 1600 compared to wbc of 4300 on 12/02/2020, hemoglobin 8.4 relative to 8.2 12/02/2020, and platelets 93.  INR 1.2.  Initial lactic acid 3.2, with repeat value trending down to 1.4 following interval administration of IV fluids, as further described below.  Urinalysis was notable for no white blood cells, rare bacteria, nitrate negative, leukocyte esterase negative, but positive for hyaline casts, showed 100 protein, and demonstrated moderate hemoglobin in the absence of any red blood cells.  Screening nasopharyngeal COVID-19 PCR was performed in the ED today, found to be negative.  Blood cultures x2 were collected prior to initiation of antibiotics.  EKG showed sinus tachycardia with heart rate 139, normal intervals, nonspecific T wave inversion in aVL, and no evidence of ST changes, including no evidence of ST elevation.  Chest x-ray showed airspace opacity in the inferior aspect of the right upper lobe consistent with pneumonia, but otherwise showed no evidence of acute cardiopulmonary process, including no evidence of edema, effusion, or pneumothorax.  Stable placement of Port-A-Cath was also noted.  Of note, the patient failed nursing bedside  swallow screen performed in the ED today.   While in the ED, the following were administered: Cefepime x1 dose, IV vancomycin x1, lactated Ringer  bolus x1 L, 70 cc lactated Ringer bolus x1, 500 cc normal saline bolus.  Subsequently, the patient was admitted to the med telemetry floor for further evaluation management of presenting severe sepsis due to to right upper lobe pneumonia.     Review of Systems: As per HPI otherwise 10 point review of systems negative.   Past Medical History:  Diagnosis Date  . Anemia   . Blood transfusion without reported diagnosis   . CKD (chronic kidney disease) 07/06/2018   CKD stage III  . Goals of care, counseling/discussion 02/04/2019  . History of radiation therapy 06/17/13-07/06/13   35 gray to upper lumbar spine  . 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 Hypertension   . Inguinal hernia   . Multiple myeloma [facility]) Sept 2010  . Multiple myeloma in relapse ([place]) 04/05/2016  . Radiation 09/26/15-10/20/15   lower thoracic spine, upper lumbar spine 30 gray  . Stroke [facility])     Past Surgical History:  Procedure Laterality Date  . COLONOSCOPY    . HIP PINNING,CANNULATED Right 09/01/2018   Procedure: [facility] NAIL HIP PINNING;  Surgeon: Melrose Nakayama, MD;  Location: [place];  Service: Orthopedics;  Laterality: Right;  . IR IMAGING GUIDED PORT INSERTION  11/01/2019  . LOOP RECORDER INSERTION N/A 04/21/2019   Procedure: LOOP RECORDER INSERTION;  Surgeon: Evans Lance, MD;  Location: [place] CV LAB;  Service: Cardiovascular;  Laterality: N/A;  . TEE WITHOUT CARDIOVERSION N/A 10/09/2018   Procedure: TRANSESOPHAGEAL ECHOCARDIOGRAM (TEE);  Surgeon: Acie Fredrickson Wonda Cheng, MD;  Location: [facility] ENDOSCOPY;  Service: Cardiovascular;  Laterality: N/A;  . TONSILLECTOMY      Social History:  reports that he has never smoked. He has never used smokeless tobacco. He reports previous alcohol use of about 4.0 standard drinks of alcohol per week. He reports that he does not use drugs.   Allergies  Allergen Reactions  . Sulfa Antibiotics Itching  . Sulfasalazine Itching    Family History  Problem Relation Age  of Onset  . Hypertension Mother   . Hypertension Father   . Colon cancer Neg Hx   . Esophageal cancer Neg Hx   . Cancer Neg Hx   . Rectal cancer Neg Hx   . Stomach cancer Neg Hx      Prior to Admission medications   Medication Sig Start Date End Date Taking? Authorizing Provider  amLODipine-benazepril (LOTREL) 5-10 MG capsule TAKE ONE CAPSULE EACH DAY 10/18/20   [name], MD  aspirin EC 81 MG tablet Take 1 tablet (81 mg total) by mouth daily. Swallow whole. 03/15/20   Frann Rider, NP  dronabinol (MARINOL) 5 MG capsule TAKE ONE CAPSULE TWICE A DAY BEFORE A MEAL 05/24/20   [name], MD  DULoxetine (CYMBALTA) 60 MG capsule TAKE ONE CAPSULE EACH DAY 11/20/20   [name], MD  finasteride (PROSCAR) 5 MG tablet TAKE ONE TABLET EACH DAY Patient taking differently: Take 5 mg by mouth daily. 04/22/19   [name], MD  lidocaine-prilocaine (EMLA) cream Apply 1 application topically as needed. 11/05/19   [name], MD  loperamide (IMODIUM A-D) 2 MG tablet Take 2 mg by mouth daily.    [provider]  metoprolol succinate (TOPROL-XL) 25 MG 24 hr tablet Take 25 mg by mouth daily. 02/25/20   [provider]  ondansetron (ZOFRAN) 4 MG tablet Take 1 tablet (4 mg total) by mouth every 6 (six) hours as needed for nausea. 10/04/19   Oswald Hillock, MD  pantoprazole (PROTONIX) 40 MG tablet TAKE ONE TABLET TWICE DAILY 11/16/20   [name], MD  penicillin v potassium (VEETID) 500 MG tablet TAKE ONE TABLET FOUR TIMES DAILY 10/17/20   Carlyle Basques, MD  polyvinyl alcohol (LIQUIFILM TEARS) 1.4 % ophthalmic solution Place 1 drop into both eyes as needed for dry eyes. 03/05/19   Swayze, Ava, DO  predniSONE (DELTASONE) 20 MG tablet TAKE ONE TABLET EACH DAY WITH BREAKFAST 11/08/20   [name], MD  Probiotic Product (PROBIOTIC PO) Take 1 capsule by mouth daily.    [provider]  pyridOXINE (VITAMIN B-6) 100 MG tablet Take 100 mg by mouth daily.  09/17/13    [name], MD  vitamin B-12 (CYANOCOBALAMIN) 500 MCG tablet Take 500 mcg by mouth daily.    [provider]  Vitamin D, Ergocalciferol, (DRISDOL) 50000 UNITS CAPS capsule Take 50,000 Units by mouth every Sunday.     [provider]  White Petrolatum-Mineral Oil (EYE LUBRICANT) OINT Apply 1 application to eye in the morning and at bedtime. If left eye lower eye lid is irritated 06/07/20   Carlyle Basques, MD     Objective    Physical Exam: Vitals:   12/06/20 1215 12/06/20 1230 12/06/20 1239 12/06/20 1245  BP: 119/69 116/70  114/68  Pulse: (!) 103 100  99  Resp: 17 19  17   Temp:   98 F (36.7 C)   TempSrc:   Oral   SpO2: 99% 99%  100%  Weight:      Height:        General: appears to be stated age; alert, oriented; increased work of breathing noted.  Skin: warm, dry, no rash Head:  AT/[place] Mouth:  Oral mucosa membranes appear dry, normal dentition Neck: supple; trachea midline Heart: Tachycardic, regular; did not appreciate any M/R/G Lungs: CTAB, did not appreciate any wheezes, rales, or rhonchi Abdomen: + BS; soft, ND, NT Vascular: 2+ pedal pulses b/l; 2+ radial pulses b/l Extremities: no peripheral edema, no muscle wasting Neuro: strength and sensation intact in upper and lower extremities b/l    Labs on Admission: I have personally reviewed following labs and imaging studies  CBC: Recent Labs  Lab 12/02/20 1204 12/06/20 1005 12/06/20 1041  WBC 4.3  --  3.7*  NEUTROABS 1.5*  --  1.6*  HGB 8.2* 7.8* 8.4*  HCT 26.0* 23.0* 27.0*  MCV 93.2  --  94.4  PLT 101*  --  93*   Basic Metabolic Panel: Recent Labs  Lab 12/02/20 1204 12/06/20 1005 12/06/20 1041  NA 139 138 139  K 4.0 4.7 5.0  CL 107  --  104  CO2 23  --  19*  GLUCOSE 98  --  99  BUN 57*  --  88*  CREATININE 1.65*  --  2.98*  CALCIUM 8.3*  --  7.9*   GFR: Estimated Creatinine Clearance: 13.2 mL/min (A) (by C-G formula based on SCr of 2.98 mg/dL (H)). Liver Function  Tests: Recent Labs  Lab 12/02/20 1204 12/06/20 1041  AST 21 50*  ALT 16 16  ALKPHOS 59 74  BILITOT 0.6 0.8  PROT 5.9* 5.1*  ALBUMIN 2.6* 2.1*   No results for input(s): LIPASE, AMYLASE in the last 168 hours. No results for input(s): AMMONIA in the last 168 hours. Coagulation Profile: Recent  Labs  Lab 12/06/20 1041  INR 1.2   Cardiac Enzymes: No results for input(s): CKTOTAL, CKMB, CKMBINDEX, TROPONINI in the last 168 hours. BNP (last 3 results) No results for input(s): PROBNP in the last 8760 hours. HbA1C: No results for input(s): HGBA1C in the last 72 hours. CBG: No results for input(s): GLUCAP in the last 168 hours. Lipid Profile: No results for input(s): CHOL, HDL, LDLCALC, TRIG, CHOLHDL, LDLDIRECT in the last 72 hours. Thyroid Function Tests: No results for input(s): TSH, T4TOTAL, FREET4, T3FREE, THYROIDAB in the last 72 hours. Anemia Panel: No results for input(s): VITAMINB12, FOLATE, FERRITIN, TIBC, IRON, RETICCTPCT in the last 72 hours. Urine analysis:    Component Value Date/Time   COLORURINE YELLOW 12/02/2020 [place] 12/02/2020 1204   LABSPEC 1.013 12/02/2020 1204   LABSPEC 1.020 09/17/2013 1036   PHURINE 5.0 12/02/2020 1204   GLUCOSEU 50 (A) 12/02/2020 1204   HGBUR NEGATIVE 12/02/2020 [place] 12/02/2020 1204   KETONESUR NEGATIVE 12/02/2020 1204   PROTEINUR 100 (A) 12/02/2020 1204   UROBILINOGEN 0.2 09/17/2013 1036   NITRITE NEGATIVE 12/02/2020 1204   LEUKOCYTESUR NEGATIVE 12/02/2020 1204    Radiological Exams on Admission: DG Chest Portable 1 View  Result Date: 12/06/2020 CLINICAL DATA:  Shortness of breath EXAM: PORTABLE CHEST 1 VIEW COMPARISON:  September 27, 2020. FINDINGS: Port-A-Cath tip is at the cavoatrial junction. No pneumothorax. There is airspace opacity in the inferior aspect of the right upper lobe consistent with pneumonia. The lungs elsewhere are clear. Heart size and pulmonary vascularity are normal.  No adenopathy. There are foci of degenerative change in the thoracic spine with thoracolumbar levoscoliosis. There is a loop recorder on the left. IMPRESSION: Airspace opacity consistent with pneumonia in the inferior aspect of the right upper lobe. Stable cardiac silhouette. Loop recorder on left. Stable Port-A-Cath position. Electronically Signed   By: Lowella Grip III M.D.   On: 12/06/2020 10:22     EKG: Independently reviewed, with result as described above.    Assessment/Plan   Christian Burns is a 87 y.o. male with medical history significant for multiple myeloma complicated by stage IIIa chronic kidney disease with baseline creatinine 1.5-1.8, chronic anemia with baseline hemoglobin 8-10, hypertension, BPH, who is admitted to [facility] on 12/06/2020 with severe sepsis due to right upper lobe pneumonia after presenting from home to [facility] ED complaining of shortness of breath.    Principal Problem:   CAP (community acquired pneumonia) Active Problems:   Myeloma ([place])   Severe sepsis ([place])   AKI (acute kidney injury) ([place])   Dysphagia   SOB (shortness of breath)   Lactic acidosis     #) Severe sepsis due to right upper lobe pneumonia: Diagnosis of the sublimity days of progressive shortness of breath associated with new onset nonproductive cough, subjective fever, with presenting chest x-ray showing evidence of airspace opacity in the inferior aspect of the right upper lobe consistent with pneumonia. SIRS criteria met via presenting objective fever, tachycardia, tachypnea.  Criteria for patient's sepsis to be considered severe in nature on the basis of concomitant evidence of endorgan damage in the form of elevated initial lactic acid level of 3.2.  No evidence of associated hypotension thus far.  Of note, the patient received greater than 30 mL/kg IVF boluses in the ED today based upon his presenting body weight of 55 kg.  No evidence of additional underlying infectious source  at this time, including urinalysis, which is not  suggestive of urinary tract infection, well presenting COVID-19 PCR screen performed in the ED today was found to be negative.  Blood cultures x2 collected in the ED today prior to initiation of cefepime and IV vancomycin.  What appears to the patient's pneumonia but otherwise community-acquired in nature, will continue broad spectrum antibiotics for now in the setting of associated presenting severe sepsis.     Plan: Lactated Ringer's at 100 cc/h.  Repeat lactic acid level.  Follow-up results of blood cultures x2.  Continue IV vancomycin and cefepime, as above.  Check MRSA PCR, with plan to discontinue IV vancomycin if this result is found to be negative due to high negative predictive value a/w this finding.  Check strep pneumonia urine antigen.  Repeat CBC with differential in the morning.  Monitor continuous pulse oximetry.  Monitor on telemetry.  Flutter valve and incentive spirometry been ordered.     #) Acute kidney injury superimposed on stage IIIa chronic kidney disease: Context of established diagnosis of stage III CKD with baseline creatinine 1.5-1.8, present creatinine noted to be elevated 2.98.  This is prerenal in nature in the setting of intravascular depletion as a result of severe sepsis due pna, as described above.  Potential additional pharmacologic contribution in setting of home benazepril.  Of note, presenting urinalysis demonstrated evidence of hyaline cast, consistent with dehydration. Additionally, UA showed moderate hemoglobin without evidence of RBCs, prompting to evaluation of cpk level, as noted below.   Plan: Work-up and management of presenting severe sepsis due to right upper lobe pneumonia, as above.  IV fluids as above.  Add on random urine sodium as well as random urine creatinine.  Monitor strict I's and O's and daily weights.  Repeat BMP in the morning.  Add on CPK level, as above.  Hold home benazepril for  now.      #) Dysphagia: Patient noted to fail nursing bedside swallow screen administered in the ED today.  Consequently, will make patient n.p.o. at this time, and consult SLP for further evaluation.   Plan: NPO.  SLP consult for formal swallow evaluation has been ordered.        #) Multiple myeloma: Follows with Dr. Marin Olp as outpatient heme-onc provider, and per patient, is schedule for next infusion via Port-A-Cath tomorrow (12/07/20).  The patient requests assistance with rescheduling of this infusion/heme-onc appointment.  Of note, patient has history of stage III chronic kidney disease, as above, as well as chronic anemia, as further noted below.  Plan repeat CBC in the morning. Repeat bmp in AM as well.  Check ionized calcium level. Discharging planning to include assistance with rescheduling of heme-onc appointment as well as associated infusion.       #) Essential hypertension: Outpatient hypertensive regimen includes amlodipine, benazepril, metoprolol succinate.  Presenting blood pressure noted to be normotensive.  In context of current n.p.o. status as well as severe sepsis, will hold home antihypertensive medications for now.  Plan: old home antihypertensive medications for now, as above.  Close monitoring of ensuing blood pressure via routine vital signs.      #) Benign prostatic hyperplasia: On Proscar as an outpatient.   Plan: Holding home Proscar for now in the setting of current abusiveness, as above.  Monitor strict I's and O's.  Repeat BMP in the morning.      #) Chronic anemia: Appears associated with his multiple myeloma, with baseline hemoglobin of 8-10.  Presenting hemoglobin found to be within this range.  INR nonelevated at 1.2  Plan: Repeat CBC in the morning.  Management of multiple myeloma, as further detailed above.      DVT prophylaxis: scd's  Code Status: Per my discussions with the patient today, he conveys that he wishes to be  DNR/DNI Family Communication: none Disposition Plan: Per Rounding Team Consults called: none  Admission status: Inpatient; med telemetry.     Of note, this patient was added by me to the following Admit List/Treatment Team: mcadmits.      PLEASE NOTE THAT DRAGON DICTATION SOFTWARE WAS USED IN THE CONSTRUCTION OF THIS NOTE.   [place] [facility] Pager [phone] From 12PM - 8PM  Otherwise, please contact night-coverage  [email or web address] Password [facility]   12/06/2020, 1:03 PM

## 2020-12-06 NOTE — Progress Notes (Signed)
Pharmacy Antibiotic Note  Christian Burns is a 87 y.o. male admitted on 12/06/2020 with pneumonia.  Pharmacy has been consulted for vancomycin and cefepime dosing.  Has hx multiple myeloma. Found to have productive cough and hypoxic. WBC 3.7, LA 3.2, afebrile. Scr 2.98 (CrCl 13 mL/min)- Scr on 1.6-1.7 earlier this month.   Plan: Cefepime 2g IV every 24 hours Vancomycin 1g IV once then will dose by level based on Scr improvement Monitor renal fx, cx results, clinical pic, and VR  Height: _0  (177.8 cm) Weight: 54.4 kg (120 lb) IBW/kg (Calculated) : 73  Temp (24hrs), Avg:104.6 F (40.3 C), Min:104.6 F (40.3 C), Max:104.6 F (40.3 C)  Recent Labs  Lab 12/02/20 1204  WBC 4.3  CREATININE 1.65*    Estimated Creatinine Clearance: 23.8 mL/min (A) (by C-G formula based on SCr of 1.65 mg/dL (H)).    Allergies  Allergen Reactions  . Sulfa Antibiotics Itching  . Sulfasalazine Itching    Antimicrobials this admission: Cefepime 4/27 >>  Vancomycin 4/27 >>   Dose adjustments this admission: N/A  Microbiology results: 4/27 BCx: sent 4/27 UCx: sent  4/27 COVID PCR: neg  Thank you for allowing pharmacy to be a part of this patient's care.  Antonietta Jewel, PharmD, [place] Clinical Pharmacist  Phone: [phone] 12/06/2020 12:59 PM  Please check AMION for all [place] phone numbers After 10:00 PM, call [place] [phone]

## 2020-12-06 NOTE — ED Provider Notes (Signed)
[facility] EMERGENCY DEPARTMENT Provider Note   CSN: [id number] Arrival date & time: 12/06/20  8250     History Chief Complaint  Patient presents with  . Shortness of Breath    Christian Burns is a 87 y.o. male.  HPI   87 year old male with past medical history of multiple myeloma undergoing active treatment, CKD, chronic anemia, previous CVA presents the emergency department concern for shortness of breath.  Patient states he had a productive cough for the past couple days, history limited secondary to acuity and dyspnea.  EMS reports that he was hypoxic to 86% on room air, responded well to supplemental oxygen.  Patient has some right-sided chest discomfort.  Denies any vomiting/diarrhea.  Past Medical History:  Diagnosis Date  . Anemia   . Blood transfusion without reported diagnosis   . CKD (chronic kidney disease) 07/06/2018   CKD stage III  . Goals of care, counseling/discussion 02/04/2019  . History of radiation therapy 06/17/13-07/06/13   35 gray to upper lumbar spine  . 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 Hypertension   . Inguinal hernia   . Multiple myeloma [facility]) Sept 2010  . Multiple myeloma in relapse ([place]) 04/05/2016  . Radiation 09/26/15-10/20/15   lower thoracic spine, upper lumbar spine 30 gray  . Stroke [facility])     Patient Active Problem List   Diagnosis Date Noted  . Aspiration pneumonitis ([place]) 09/28/2020  . Pancytopenia ([place]) 09/28/2020  . Dysphagia 09/20/2020  . SIRS (systemic inflammatory response syndrome) ([place]) 09/28/2019  . Multifocal pneumonia 09/28/2019  . Anemia associated with chemotherapy 09/28/2019  . Protein-calorie malnutrition, severe 06/24/2019  . Leukopenia due to antineoplastic chemotherapy ([place]) 06/23/2019  . Nausea 06/23/2019  . Diarrhea 06/23/2019  . CKD (chronic kidney disease), stage III ([place]) 06/23/2019  . HCAP (healthcare-associated pneumonia) 06/22/2019  . Hyperlipidemia LDL goal <70 04/20/2019   . Essential hypertension 04/20/2019  . Advanced age 45/03/2019  . Stroke ([place]) 04/19/2019  . Community acquired pneumonia 03/03/2019  . AKI (acute kidney injury) ([place]) 03/03/2019  . Normochromic normocytic anemia 03/03/2019  . Goals of care, counseling/discussion 02/04/2019  . Acute osteomyelitis of jaw 11/26/2018  . Bacteremia   . 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 Sepsis due to undetermined organism ([place])   . Left lower lobe pneumonia 10/06/2018  . Right hip pain 09/01/2018  . Iron deficiency anemia secondary to inadequate dietary iron intake 07/18/2016  . Multiple myeloma in relapse ([place]) 04/05/2016  . 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 Multiple myeloma in remission ([place]) 09/08/2015  . Myeloma ([place]) 08/23/2011    Past Surgical History:  Procedure Laterality Date  . COLONOSCOPY    . HIP PINNING,CANNULATED Right 09/01/2018   Procedure: [facility] NAIL HIP PINNING;  Surgeon: Melrose Nakayama, MD;  Location: [place];  Service: Orthopedics;  Laterality: Right;  . IR IMAGING GUIDED PORT INSERTION  11/01/2019  . LOOP RECORDER INSERTION N/A 04/21/2019   Procedure: LOOP RECORDER INSERTION;  Surgeon: Evans Lance, MD;  Location: [place] CV LAB;  Service: Cardiovascular;  Laterality: N/A;  . TEE WITHOUT CARDIOVERSION N/A 10/09/2018   Procedure: TRANSESOPHAGEAL ECHOCARDIOGRAM (TEE);  Surgeon: Acie Fredrickson Wonda Cheng, MD;  Location: [facility] ENDOSCOPY;  Service: Cardiovascular;  Laterality: N/A;  . TONSILLECTOMY         Family History  Problem Relation Age of Onset  . Hypertension Mother   . Hypertension Father   . Colon cancer Neg Hx   . Esophageal cancer Neg Hx   .  Cancer Neg Hx   . Rectal cancer Neg Hx   . Stomach cancer Neg Hx     Social History   Tobacco Use  . Smoking status: Never Smoker  . Smokeless tobacco: Never Used  . Tobacco comment: never used tobacco  Vaping Use  . Vaping Use: Never used  Substance Use Topics  . Alcohol use: Not Currently     Alcohol/week: 4.0 standard drinks    Types: 4 Glasses of wine per week  . Drug use: No    Home Medications Prior to Admission medications   Medication Sig Start Date End Date Taking? Authorizing Provider  amLODipine-benazepril (LOTREL) 5-10 MG capsule TAKE ONE CAPSULE EACH DAY 10/18/20   [name], MD  aspirin EC 81 MG tablet Take 1 tablet (81 mg total) by mouth daily. Swallow whole. 03/15/20   Frann Rider, NP  dronabinol (MARINOL) 5 MG capsule TAKE ONE CAPSULE TWICE A DAY BEFORE A MEAL 05/24/20   [name], MD  DULoxetine (CYMBALTA) 60 MG capsule TAKE ONE CAPSULE EACH DAY 11/20/20   [name], MD  finasteride (PROSCAR) 5 MG tablet TAKE ONE TABLET EACH DAY Patient taking differently: Take 5 mg by mouth daily. 04/22/19   [name], MD  lidocaine-prilocaine (EMLA) cream Apply 1 application topically as needed. 11/05/19   [name], MD  loperamide (IMODIUM A-D) 2 MG tablet Take 2 mg by mouth daily.    [provider]  metoprolol succinate (TOPROL-XL) 25 MG 24 hr tablet Take 25 mg by mouth daily. 02/25/20   [provider]  ondansetron (ZOFRAN) 4 MG tablet Take 1 tablet (4 mg total) by mouth every 6 (six) hours as needed for nausea. 10/04/19   Oswald Hillock, MD  pantoprazole (PROTONIX) 40 MG tablet TAKE ONE TABLET TWICE DAILY 11/16/20   [name], MD  penicillin v potassium (VEETID) 500 MG tablet TAKE ONE TABLET FOUR TIMES DAILY 10/17/20   Carlyle Basques, MD  polyvinyl alcohol (LIQUIFILM TEARS) 1.4 % ophthalmic solution Place 1 drop into both eyes as needed for dry eyes. 03/05/19   Swayze, Ava, DO  predniSONE (DELTASONE) 20 MG tablet TAKE ONE TABLET EACH DAY WITH BREAKFAST 11/08/20   [name], MD  Probiotic Product (PROBIOTIC PO) Take 1 capsule by mouth daily.    [provider]  pyridOXINE (VITAMIN B-6) 100 MG tablet Take 100 mg by mouth daily.  09/17/13   [name], MD  vitamin B-12 (CYANOCOBALAMIN) 500 MCG tablet Take 500  mcg by mouth daily.    [provider]  Vitamin D, Ergocalciferol, (DRISDOL) 50000 UNITS CAPS capsule Take 50,000 Units by mouth every Sunday.     [provider]  White Petrolatum-Mineral Oil (EYE LUBRICANT) OINT Apply 1 application to eye in the morning and at bedtime. If left eye lower eye lid is irritated 06/07/20   Carlyle Basques, MD    Allergies    Sulfa antibiotics and Sulfasalazine  Review of Systems   Review of Systems  Reason unable to perform ROS: Limited secondary to acuity and dyspnea.  Constitutional: Positive for fever.  Respiratory: Positive for cough and shortness of breath.   Cardiovascular: Positive for chest pain.  Gastrointestinal: Negative for diarrhea and vomiting.  Neurological: Negative for headaches.    Physical Exam Updated Vital Signs BP 115/62   Pulse (!) 122   Temp (!) 104.6 F (40.3 C) (Rectal)   Resp (!) 25   Ht 5' 10"  (1.778 m)  Wt 54.4 kg   SpO2 99%   BMI 17.22 kg/m   Physical Exam Vitals and nursing note reviewed.  Constitutional:      Appearance: Normal appearance. He is ill-appearing.  HENT:     Head: Normocephalic.     Mouth/Throat:     Mouth: Mucous membranes are moist.  Cardiovascular:     Rate and Rhythm: Tachycardia present.  Pulmonary:     Effort: Tachypnea and accessory muscle usage present. No respiratory distress.     Breath sounds: Rales present.  Chest:     Chest wall: No tenderness.  Abdominal:     Palpations: Abdomen is soft.     Tenderness: There is no abdominal tenderness.  Musculoskeletal:     Right lower leg: No edema.     Left lower leg: No edema.  Skin:    General: Skin is warm.  Neurological:     Mental Status: He is alert and oriented to person, place, and time. Mental status is at baseline.     ED Results / Procedures / Treatments   Labs (all labs ordered are listed, but only abnormal results are displayed) Labs Reviewed  I-STAT ARTERIAL BLOOD GAS, ED - Abnormal; Notable for  the following components:      Result Value   pO2, Arterial 366 (*)    Bicarbonate 19.7 (*)    TCO2 21 (*)    Acid-base deficit 4.0 (*)    Calcium, Ion 1.13 (*)    HCT 23.0 (*)    Hemoglobin 7.8 (*)    All other components within normal limits  CULTURE, BLOOD (ROUTINE X 2)  CULTURE, BLOOD (ROUTINE X 2)  RESP PANEL BY RT-PCR (FLU A&B, COVID) ARPGX2  URINE CULTURE  COMPREHENSIVE METABOLIC PANEL  LACTIC ACID, PLASMA  LACTIC ACID, PLASMA  CBC WITH DIFFERENTIAL/PLATELET  PROTIME-INR  URINALYSIS, ROUTINE W REFLEX MICROSCOPIC  BLOOD GAS, ARTERIAL  APTT    EKG EKG Interpretation  Date/Time:  Wednesday December 06 2020 09:57:29 EDT Ventricular Rate:  139 PR Interval:  145 QRS Duration: 75 QT Interval:  271 QTC Calculation: 412 R Axis:   78 Text Interpretation: Sinus tachycardia Nonspecific T abnormalities, lateral leads Confirmed by Lavenia Atlas [phone]) on 12/06/2020 10:21:57 AM   Radiology DG Chest Portable 1 View  Result Date: 12/06/2020 CLINICAL DATA:  Shortness of breath EXAM: PORTABLE CHEST 1 VIEW COMPARISON:  September 27, 2020. FINDINGS: Port-A-Cath tip is at the cavoatrial junction. No pneumothorax. There is airspace opacity in the inferior aspect of the right upper lobe consistent with pneumonia. The lungs elsewhere are clear. Heart size and pulmonary vascularity are normal. No adenopathy. There are foci of degenerative change in the thoracic spine with thoracolumbar levoscoliosis. There is a loop recorder on the left. IMPRESSION: Airspace opacity consistent with pneumonia in the inferior aspect of the right upper lobe. Stable cardiac silhouette. Loop recorder on left. Stable Port-A-Cath position. Electronically Signed   By: Lowella Grip III M.D.   On: 12/06/2020 10:22    Procedures .Critical Care Performed by: Lorelle Gibbs, DO Authorized by: Lorelle Gibbs, DO   Critical care provider statement:    Critical care time (minutes):  45   Critical care was  necessary to treat or prevent imminent or life-threatening deterioration of the following conditions:  Dehydration, sepsis and respiratory failure   Critical care was time spent personally by me on the following activities:  Discussions with consultants, evaluation of patient's response to treatment, examination of patient, ordering and performing  treatments and interventions, ordering and review of laboratory studies, ordering and review of radiographic studies, pulse oximetry, re-evaluation of patient's condition, obtaining history from patient or surrogate and review of old charts   I assumed direction of critical care for this patient from another provider in my specialty: no     Care discussed with: admitting provider       Medications Ordered in ED Medications  lactated ringers infusion (has no administration in time range)  lactated ringers bolus 1,000 mL (has no administration in time range)    And  lactated ringers bolus 500 mL (has no administration in time range)    And  lactated ringers bolus 250 mL (has no administration in time range)  vancomycin (VANCOREADY) IVPB 1000 mg/200 mL (has no administration in time range)  ceFEPIme (MAXIPIME) 2 g in sodium chloride 0.9 % 100 mL IVPB (2 g Intravenous New Bag/Given 12/06/20 1051)  sodium chloride 0.9 % bolus 500 mL (0 mLs Intravenous Stopped 12/06/20 1051)  acetaminophen (TYLENOL) tablet 1,000 mg (1,000 mg Oral Given 12/06/20 1016)    ED Course  I have reviewed the triage vital signs and the nursing notes.  Pertinent labs & imaging results that were available during my care of the patient were reviewed by me and considered in my medical decision making (see chart for details).    MDM Rules/Calculators/A&P                          87 year old male presents the emergency department with respiratory distress.  He is noted to be febrile up to 104 rectally, tachycardic, dyspneic.  He has rales in the right lung fields.  Chest x-ray  confirms a right-sided pneumonia, concern for sepsis.  ABG was done and his PaO2 was high, he was taken off of supplemental oxygen.  Plan to evaluate and treat per sepsis.  Lactic acid is over 3, rest of his blood work looks comparable to baseline with a slightly worsening kidney dysfunction.  After fluids and medicine patient has significantly improved, tachycardia has resolved, he is now afebrile and breathing is much more comfortable.  Antibiotics are running, have attempted to update his son [name].  Patients evaluation and results requires admission for further treatment and care. Patient agrees with admission plan, offers no new complaints and is stable/unchanged at time of admit.  Final Clinical Impression(s) / ED Diagnoses Final diagnoses:  None    Rx / DC Orders ED Discharge Orders    None       Lorelle Gibbs, DO 12/06/20 1307

## 2020-12-07 ENCOUNTER — Inpatient Hospital Stay: Payer: Medicare HMO

## 2020-12-07 ENCOUNTER — Inpatient Hospital Stay: Payer: Medicare HMO | Admitting: Hematology & Oncology

## 2020-12-07 ENCOUNTER — Inpatient Hospital Stay (HOSPITAL_COMMUNITY): Payer: Medicare HMO

## 2020-12-07 ENCOUNTER — Telehealth: Payer: Self-pay | Admitting: *Deleted

## 2020-12-07 ENCOUNTER — Encounter (HOSPITAL_COMMUNITY): Payer: Self-pay | Admitting: Internal Medicine

## 2020-12-07 LAB — CBC WITH DIFFERENTIAL/PLATELET
Abs Immature Granulocytes: 0.72 10*3/uL — ABNORMAL HIGH (ref 0.00–0.07)
Basophils Absolute: 0.1 10*3/uL (ref 0.0–0.1)
Basophils Relative: 1 %
Eosinophils Absolute: 0 10*3/uL (ref 0.0–0.5)
Eosinophils Relative: 1 %
HCT: 25.6 % — ABNORMAL LOW (ref 39.0–52.0)
Hemoglobin: 8.3 g/dL — ABNORMAL LOW (ref 13.0–17.0)
Immature Granulocytes: 13 %
Lymphocytes Relative: 1 %
Lymphs Abs: 0.1 10*3/uL — ABNORMAL LOW (ref 0.7–4.0)
MCH: 29.7 pg (ref 26.0–34.0)
MCHC: 32.4 g/dL (ref 30.0–36.0)
MCV: 91.8 fL (ref 80.0–100.0)
Monocytes Absolute: 2.6 10*3/uL — ABNORMAL HIGH (ref 0.1–1.0)
Monocytes Relative: 47 %
Neutro Abs: 2 10*3/uL (ref 1.7–7.7)
Neutrophils Relative %: 37 %
Platelets: 86 10*3/uL — ABNORMAL LOW (ref 150–400)
RBC: 2.79 MIL/uL — ABNORMAL LOW (ref 4.22–5.81)
RDW: 18.1 % — ABNORMAL HIGH (ref 11.5–15.5)
WBC: 5.5 10*3/uL (ref 4.0–10.5)
nRBC: 0.4 % — ABNORMAL HIGH (ref 0.0–0.2)

## 2020-12-07 LAB — CREATININE, URINE, RANDOM: Creatinine, Urine: 23.14 mg/dL

## 2020-12-07 LAB — SODIUM, URINE, RANDOM: Sodium, Ur: 82 mmol/L

## 2020-12-07 LAB — COMPREHENSIVE METABOLIC PANEL
ALT: 18 U/L (ref 0–44)
AST: 31 U/L (ref 15–41)
Albumin: 2 g/dL — ABNORMAL LOW (ref 3.5–5.0)
Alkaline Phosphatase: 75 U/L (ref 38–126)
Anion gap: 13 (ref 5–15)
BUN: 69 mg/dL — ABNORMAL HIGH (ref 8–23)
CO2: 21 mmol/L — ABNORMAL LOW (ref 22–32)
Calcium: 8.2 mg/dL — ABNORMAL LOW (ref 8.9–10.3)
Chloride: 108 mmol/L (ref 98–111)
Creatinine, Ser: 2.66 mg/dL — ABNORMAL HIGH (ref 0.61–1.24)
GFR, Estimated: 22 mL/min — ABNORMAL LOW (ref >60)
Glucose, Bld: 82 mg/dL (ref 70–99)
Potassium: 3.9 mmol/L (ref 3.5–5.1)
Sodium: 142 mmol/L (ref 135–145)
Total Bilirubin: 1 mg/dL (ref 0.3–1.2)
Total Protein: 5.2 g/dL — ABNORMAL LOW (ref 6.5–8.1)

## 2020-12-07 LAB — STREP PNEUMONIAE URINARY ANTIGEN: Strep Pneumo Urinary Antigen: NEGATIVE

## 2020-12-07 LAB — MAGNESIUM: Magnesium: 1.2 mg/dL — ABNORMAL LOW (ref 1.7–2.4)

## 2020-12-07 LAB — TROPONIN I (HIGH SENSITIVITY): Troponin I (High Sensitivity): 139 ng/L (ref <18)

## 2020-12-07 LAB — MRSA PCR SCREENING: MRSA by PCR: NEGATIVE

## 2020-12-07 LAB — PHOSPHORUS: Phosphorus: 3.9 mg/dL (ref 2.5–4.6)

## 2020-12-07 MED ORDER — DULOXETINE HCL 60 MG PO CPEP
60.0000 mg | ORAL_CAPSULE | Freq: Every day | ORAL | Status: DC
  Administered 2020-12-07 – 2020-12-08 (×2): 60 mg via ORAL
  Filled 2020-12-07 (×3): qty 1

## 2020-12-07 MED ORDER — MAGNESIUM SULFATE 2 GM/50ML IV SOLN
2.0000 g | Freq: Once | INTRAVENOUS | Status: AC
  Administered 2020-12-07: 2 g via INTRAVENOUS
  Filled 2020-12-07: qty 50

## 2020-12-07 MED ORDER — METOPROLOL TARTRATE 5 MG/5ML IV SOLN
2.5000 mg | Freq: Once | INTRAVENOUS | Status: AC
  Administered 2020-12-07: 2.5 mg via INTRAVENOUS
  Filled 2020-12-07: qty 5

## 2020-12-07 MED ORDER — METOPROLOL TARTRATE 5 MG/5ML IV SOLN: 2.5000 mg | Freq: Once | INTRAVENOUS | Status: DC

## 2020-12-07 MED ORDER — PREDNISONE 20 MG PO TABS
20.0000 mg | ORAL_TABLET | Freq: Every day | ORAL | Status: DC
  Administered 2020-12-07 – 2020-12-09 (×3): 20 mg via ORAL
  Filled 2020-12-07 (×3): qty 1

## 2020-12-07 MED ORDER — ENOXAPARIN SODIUM 30 MG/0.3ML IJ SOSY: 30.0000 mg | PREFILLED_SYRINGE | Freq: Every day | INTRAMUSCULAR | Status: DC

## 2020-12-07 MED ORDER — METOPROLOL TARTRATE 5 MG/5ML IV SOLN
2.5000 mg | INTRAVENOUS | Status: DC | PRN
  Administered 2020-12-07 – 2020-12-08 (×2): 2.5 mg via INTRAVENOUS
  Filled 2020-12-07 (×2): qty 5

## 2020-12-07 MED ORDER — DIAZEPAM 5 MG/ML IJ SOLN
2.5000 mg | Freq: Once | INTRAMUSCULAR | Status: AC
  Administered 2020-12-07: 2.5 mg via INTRAVENOUS
  Filled 2020-12-07: qty 2

## 2020-12-07 MED ORDER — LIDOCAINE 5 % EX PTCH
1.0000 | MEDICATED_PATCH | CUTANEOUS | Status: DC
  Administered 2020-12-07 – 2020-12-09 (×2): 1 via TRANSDERMAL
  Filled 2020-12-07 (×3): qty 1

## 2020-12-07 MED ORDER — LACTATED RINGERS IV SOLN: INTRAVENOUS | Status: DC

## 2020-12-07 MED ORDER — CIPROFLOXACIN HCL 0.3 % OP SOLN
2.0000 [drp] | OPHTHALMIC | Status: DC
  Administered 2020-12-07 – 2020-12-09 (×10): 2 [drp] via OPHTHALMIC
  Filled 2020-12-07: qty 2.5

## 2020-12-07 MED ORDER — METOPROLOL SUCCINATE ER 25 MG PO TB24
25.0000 mg | ORAL_TABLET | Freq: Every day | ORAL | Status: DC
  Administered 2020-12-07 – 2020-12-09 (×3): 25 mg via ORAL
  Filled 2020-12-07 (×3): qty 1

## 2020-12-07 NOTE — Progress Notes (Signed)
TR[facility] night shift.  No changes on chest radiograph.  First troponin result came back 2311 and he was 139 ng/L.  This is likely due to demand ischemia in the setting of tachycardia.  I have added metoprolol 2.5 mg every 4 hours as needed for tachycardia.  His second troponin is due now and I have asked for a stat second troponin level now.  Tennis Must, MD.

## 2020-12-07 NOTE — Progress Notes (Signed)
Modified Barium Swallow Progress Note  Patient Details  Name: Christian Burns MRN: [id number] Date of Birth: July 18, 1930  Today's Date: 12/07/2020  Modified Barium Swallow completed.  Full report located under Chart Review in the Imaging Section.  Brief recommendations include the following:  Clinical Impression  Pt presents with primary pharyngoesophageal dysphagia marked by trace peentration/aspiration of liquids, reduced epiglottic inversion and relaxation of UES resulting in moderate pharyngeal residue and esophageal backflow of POs into pyriform sinuses. Small cup sips of thin resulted in penetration (PAS 2) and trace aspiration (PAS 6) with consecutive sips. Trials of NTL only increased pharyngeal residuals. Pureed solids also significant for pharyngeal residuals, which improved minimally with effortful swallows, throat clear/re swallows, however minimal residuals remained in vallecua, pyriforms and at UES junction. Overall, residuals and esophageal backflow into cervical esophagus increased with thicker textures/consistencies, however msot POs did not compromise airway. Recommend pt continue on dysphagia 2, thin liquid diet (no straw). Educated pt on taking small sips/bites at slow rate, performing effortful swallows, throat clear/re swallows and sitting upright for all meals (remaining upright for 30 minutes after as well). Recommend esophageal assessement given this component impacts pt aspiration risk.   Swallow Evaluation Recommendations   Recommended Consults: Consider esophageal assessment   SLP Diet Recommendations: Thin liquid;Dysphagia 2 (Fine chop) solids   Liquid Administration via: No straw;Cup   Medication Administration: Crushed with puree   Supervision: Intermittent supervision to cue for compensatory strategies;Patient able to self feed   Compensations: Slow rate;Small sips/bites;Clear throat intermittently;Effortful swallow   Postural Changes: Remain semi-upright after  after feeds/meals (Comment);Seated upright at 90 degrees   Oral Care Recommendations: Oral care BID       Ellwood Dense, [place], [place] Office Number: [phone]  Acie Fredrickson 12/07/2020,4:31 PM

## 2020-12-07 NOTE — Progress Notes (Signed)
TR[facility] night shift.  The staff reports that the patient is complaining of neck pain and stiffness.  He is currently n.p.o. due to aspiration and failing swallow screen.  He was also diagnosed with AKI.  Diazepam 2.5 mg IVP x1 dose ordered.  Tennis Must, MD.

## 2020-12-07 NOTE — Progress Notes (Signed)
Pharmacy Antibiotic Note  Christian Burns is a 87 y.o. male admitted on 12/06/2020 with pneumonia.  Pharmacy has been consulted for vancomycin and cefepime dosing.  Has hx multiple myeloma. Scr down trending to 2.66 but baseline is ~1.6. ClCr ~14 ml/min. MRSA negative. Discussed with MD, agreed with holding vancomycin.  Plan: Cefepime 2g IV every 24 hours Stop Vancomycin  Monitor cultures, clinical status, renal fx Narrow abx as able and f/u duration    Height: 5' 10"  (177.8 cm) Weight: 54.4 kg (120 lb) IBW/kg (Calculated) : 73  Temp (24hrs), Avg:99.1 F (37.3 C), Min:97.5 F (36.4 C), Max:104.6 F (40.3 C)  Recent Labs  Lab 12/02/20 1204 12/06/20 1041 12/06/20 1245 12/07/20 0702  WBC 4.3 3.7*  --  5.5  CREATININE 1.65* 2.98*  --  2.66*  LATICACIDVEN  --  3.2* 1.4  --     Estimated Creatinine Clearance: 14.8 mL/min (A) (by C-G formula based on SCr of 2.66 mg/dL (H)).    Allergies  Allergen Reactions  . Tape Other (See Comments)    Skin is VERY THIN- TEARS and BRUISES VERY EASILY!!  . Sulfamethoxazole-Trimethoprim Itching  . Sulfa Antibiotics Itching  . Sulfasalazine Itching    Antimicrobials this admission: Cefepime 4/27 >>  Vancomycin 4/27   Dose adjustments this admission: N/A  Microbiology results: 4/27 BCx: ngtd 4/27 UCx: sent  4/27 COVID PCR: neg 4/28 MRSA neg   Thank you for allowing pharmacy to be a part of this patient's care.  Benetta Spar, PharmD, BCPS, BCCP Clinical Pharmacist  Please check AMION for all [place] phone numbers After 10:00 PM, call [place] [phone]

## 2020-12-07 NOTE — Consult Note (Addendum)
[place]  Telephone:([phone] Fax:([phone]) [place]  Reason for Referral: Multiple myeloma, anemia, thrombocytopenia  HPI: Christian [place] is an 87 year old male with a past medical history significant for multiple myeloma, stage III CKD, chronic anemia, hypertension, BPH.  The patient presented to the emergency room with shortness of breath x1 to 2 days.  He also has a nonproductive cough.  Temperature in the emergency room was noted to be 104.6 initially.  Chest x-ray showed airspace opacity in the inferior aspect of the right upper lobe consistent with pneumonia.  Cultures obtained and he was started on IV antibiotics.  The patient was seen this afternoon after working with physical therapy.  He is very fatigued after walking from the bathroom back to bed.  He is also shaking.  Currently afebrile.  He is not having any headaches or dizziness.  He has had left eye redness at home.  He has been seen by ophthalmology who did not think it is infected.  According to the patient's grandson, his left eye looks much better than he did previously. He denies chest pain but has shortness of breath.  He has a cough.  Denies abdominal pain, nausea, vomiting.  No bleeding reported.  He had a swallow evaluation performed earlier today and they have recommended dysphagia 2 diet with thin liquids.  Medical oncology was asked to see the patient for recommendations regarding his multiple myeloma, anemia, thrombocytopenia.   Past Medical History:  Diagnosis Date  . Anemia   . Blood transfusion without reported diagnosis   . CKD (chronic kidney disease) 07/06/2018   CKD stage III  . Goals of care, counseling/discussion 02/04/2019  . History of radiation therapy 06/17/13-07/06/13   35 gray to upper lumbar spine  . 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 Hypertension   . Inguinal hernia   . Multiple myeloma [facility]) Sept 2010  . Multiple myeloma in relapse ([place])  04/05/2016  . Radiation 09/26/15-10/20/15   lower thoracic spine, upper lumbar spine 30 gray  . Stroke [facility])   :  Past Surgical History:  Procedure Laterality Date  . COLONOSCOPY    . HIP PINNING,CANNULATED Right 09/01/2018   Procedure: [facility] NAIL HIP PINNING;  Surgeon: Melrose Nakayama, MD;  Location: [place];  Service: Orthopedics;  Laterality: Right;  . IR IMAGING GUIDED PORT INSERTION  11/01/2019  . LOOP RECORDER INSERTION N/A 04/21/2019   Procedure: LOOP RECORDER INSERTION;  Surgeon: Evans Lance, MD;  Location: [place] CV [facility];  Service: Cardiovascular;  Laterality: N/A;  . TEE WITHOUT CARDIOVERSION N/A 10/09/2018   Procedure: TRANSESOPHAGEAL ECHOCARDIOGRAM (TEE);  Surgeon: Acie Fredrickson Wonda Cheng, MD;  Location: [facility] ENDOSCOPY;  Service: Cardiovascular;  Laterality: N/A;  . TONSILLECTOMY    :  Current Facility-Administered Medications  Medication Dose Route Frequency Provider Last Rate Last Admin  . acetaminophen (TYLENOL) tablet 650 mg  650 mg Oral Q6H PRN Howerter, Justin B, DO       Or  . acetaminophen (TYLENOL) suppository 650 mg  650 mg Rectal Q6H PRN Howerter, Justin B, DO      . ceFEPIme (MAXIPIME) 2 g in sodium chloride 0.9 % 100 mL IVPB  2 g Intravenous Q24H Horton, Kristie M, DO 200 mL/hr at 12/07/20 1028 2 g at 12/07/20 1028  . DULoxetine (CYMBALTA) DR capsule 60 mg  60 mg Oral Daily Nolberto Hanlon, MD      . lactated ringers infusion   Intravenous Continuous Nolberto Hanlon, MD 75  mL/hr at 12/07/20 1033 New Bag at 12/07/20 1033  . lidocaine (LIDODERM) 5 % 1 patch  1 patch Transdermal Q24H Nolberto Hanlon, MD   1 patch at 12/07/20 1459  . metoprolol succinate (TOPROL-XL) 24 hr tablet 25 mg  25 mg Oral Daily Nolberto Hanlon, MD   25 mg at 12/07/20 1125  . predniSONE (DELTASONE) tablet 20 mg  20 mg Oral Q breakfast Nolberto Hanlon, MD         Allergies  Allergen Reactions  . Tape Other (See Comments)    Skin is VERY THIN- TEARS and BRUISES VERY EASILY!!  . Sulfamethoxazole-Trimethoprim  Itching  . Sulfa Antibiotics Itching  . Sulfasalazine Itching  :  Family History  Problem Relation Age of Onset  . Hypertension Mother   . Hypertension Father   . Colon cancer Neg Hx   . Esophageal cancer Neg Hx   . Cancer Neg Hx   . Rectal cancer Neg Hx   . Stomach cancer Neg Hx   :  Social History   Socioeconomic History  . Marital status: Married    Spouse name: Christian [place]  . Number of children: Not on file  . Years of education: Not on file  . Highest education level: Not on file  Occupational History  . Occupation: retired  Tobacco Use  . Smoking status: Never Smoker  . Smokeless tobacco: Never Used  . Tobacco comment: never used tobacco  Vaping Use  . Vaping Use: Never used  Substance and Sexual Activity  . Alcohol use: Not Currently    Alcohol/week: 4.0 standard drinks    Types: 4 Glasses of wine per week  . Drug use: No  . Sexual activity: Not on file  Other Topics Concern  . Not on file  Social History Narrative   Christian [place] is retired and lives at [employer] at [address] [address] retirement apartment with his wife, Christian [place]. They have assist from their son, Christian [place] and grandson, Christian [place]. He is independent/assist with his care needs His son and/or grandson assists with transportation to medical appointments   Social Determinants of Health   Financial Resource Strain: Not on file  Food Insecurity: Not on file  Transportation Needs: Not on file  Physical Activity: Not on file  Stress: Not on file  Social Connections: Not on file  Intimate Partner Violence: Not on file  :  Review of Systems: A comprehensive 14 point review of systems was negative except as noted in the HPI.  Exam: Patient Vitals for the past 24 hrs:  BP Temp Temp src Pulse Resp SpO2  12/07/20 1420 136/72 98.3 F (36.8 C) Oral 95 16 95 %  12/07/20 1213 (!) 144/78 98.1 F (36.7 C) Oral (!) 101 16 96 %  12/07/20 1021 (!) 179/90 97.8 F (36.6 C) Oral (!) 115 18 96 %  12/07/20 0411 137/74 97.7 F (36.5 C)  Oral 95 19 98 %  12/07/20 0019 134/74 98.3 F (36.8 C) Oral 86 -- 100 %  12/06/20 2041 127/74 98.3 F (36.8 C) Oral 83 15 98 %  12/06/20 1806 108/69 (!) 97.5 F (36.4 C) Oral 79 18 100 %    General: Thin, frail gentleman who appears tachypneic Eyes: Left eye erythematous with yellow drainage, no scleral icterus.   ENT:  There were no oropharyngeal lesions.    Lymphatics:  Negative cervical, supraclavicular or axillary adenopathy.   Respiratory: lungs were clear bilaterally without wheezing or crackles, tachypneic.   Cardiovascular: Tachycardic, no lower extremity edema GI:  abdomen was soft, flat, nontender, nondistended, without organomegaly.     Skin exam was without echymosis, petichae.   Neuro exam was nonfocal. Patient was alert and oriented.  Attention was good.   Language was appropriate.  Mood was normal without depression.  Speech was not pressured.  Thought content was not tangential.     Lab Results  Component Value Date   WBC 5.5 12/07/2020   HGB 8.3 (L) 12/07/2020   HCT 25.6 (L) 12/07/2020   PLT 86 (L) 12/07/2020   GLUCOSE 82 12/07/2020   CHOL 148 04/20/2019   TRIG 96 04/20/2019   HDL 58 04/20/2019   LDLCALC 71 04/20/2019   ALT 18 12/07/2020   AST 31 12/07/2020   NA 142 12/07/2020   K 3.9 12/07/2020   CL 108 12/07/2020   CREATININE 2.66 (H) 12/07/2020   BUN 69 (H) 12/07/2020   CO2 21 (L) 12/07/2020    DG Chest Portable 1 View  Result Date: 12/06/2020 CLINICAL DATA:  Shortness of breath EXAM: PORTABLE CHEST 1 VIEW COMPARISON:  September 27, 2020. FINDINGS: Port-A-Cath tip is at the cavoatrial junction. No pneumothorax. There is airspace opacity in the inferior aspect of the right upper lobe consistent with pneumonia. The lungs elsewhere are clear. Heart size and pulmonary vascularity are normal. No adenopathy. There are foci of degenerative change in the thoracic spine with thoracolumbar levoscoliosis. There is a loop recorder on the left. IMPRESSION: Airspace  opacity consistent with pneumonia in the inferior aspect of the right upper lobe. Stable cardiac silhouette. Loop recorder on left. Stable Port-A-Cath position. Electronically Signed   By: Lowella Grip III M.D.   On: 12/06/2020 10:22   DG ABD ACUTE 2+V W 1V CHEST  Result Date: 12/02/2020 CLINICAL DATA:  Pain, lower abdominal tenderness. EXAM: DG ABDOMEN ACUTE WITH 1 VIEW CHEST COMPARISON:  Chest radiograph dated 09/27/2020. CT abdomen pelvis dated 06/08/2013. FINDINGS: There is no evidence of dilated bowel loops or free intraperitoneal air. No radiopaque calculi or other significant radiographic abnormality is seen. Heart size and mediastinal contours are within normal limits. Both lungs are clear. A right internal jugular central venous port catheter tip overlies the superior cavoatrial junction. A cardiac loop recorder overlies the left chest. Vertebroplasty changes are seen in at L2. Degenerative changes are seen in the spine and right greater than left hips. The patient is status fixation of the proximal right femur, partially imaged. IMPRESSION: Nonobstructive bowel gas pattern.  No acute cardiopulmonary disease. Electronically Signed   By: Zerita Boers M.D.   On: 12/02/2020 12:53   CUP PACEART REMOTE DEVICE CHECK  Result Date: 11/18/2020 ILR summary report received. Battery status OK. Normal device function. No new symptom, tachy, brady, or pause episodes. No new AF episodes. Monthly summary reports and ROV/PRN Kathy Breach, RN, CCDS, [employer]    DG Chest Portable 1 View  Result Date: 12/06/2020 CLINICAL DATA:  Shortness of breath EXAM: PORTABLE CHEST 1 VIEW COMPARISON:  September 27, 2020. FINDINGS: Port-A-Cath tip is at the cavoatrial junction. No pneumothorax. There is airspace opacity in the inferior aspect of the right upper lobe consistent with pneumonia. The lungs elsewhere are clear. Heart size and pulmonary vascularity are normal. No adenopathy. There are foci of  degenerative change in the thoracic spine with thoracolumbar levoscoliosis. There is a loop recorder on the left. IMPRESSION: Airspace opacity consistent with pneumonia in the inferior aspect of the right upper lobe. Stable cardiac silhouette. Loop recorder on left. Stable Port-A-Cath position.  Electronically Signed   By: Lowella Grip III M.D.   On: 12/06/2020 10:22   DG ABD ACUTE 2+V W 1V CHEST  Result Date: 12/02/2020 CLINICAL DATA:  Pain, lower abdominal tenderness. EXAM: DG ABDOMEN ACUTE WITH 1 VIEW CHEST COMPARISON:  Chest radiograph dated 09/27/2020. CT abdomen pelvis dated 06/08/2013. FINDINGS: There is no evidence of dilated bowel loops or free intraperitoneal air. No radiopaque calculi or other significant radiographic abnormality is seen. Heart size and mediastinal contours are within normal limits. Both lungs are clear. A right internal jugular central venous port catheter tip overlies the superior cavoatrial junction. A cardiac loop recorder overlies the left chest. Vertebroplasty changes are seen in at L2. Degenerative changes are seen in the spine and right greater than left hips. The patient is status fixation of the proximal right femur, partially imaged. IMPRESSION: Nonobstructive bowel gas pattern.  No acute cardiopulmonary disease. Electronically Signed   By: Zerita Boers M.D.   On: 12/02/2020 12:53   CUP PACEART REMOTE DEVICE CHECK  Result Date: 11/18/2020 ILR summary report received. Battery status OK. Normal device function. No new symptom, tachy, brady, or pause episodes. No new AF episodes. Monthly summary reports and ROV/PRN Kathy Breach, RN, CCDS, [employer]  Assessment and Plan:  1.  Recurrent, progressive IgG lambda multiple myeloma 2.  Anemia secondary to renal insufficiency and chemotherapy 3.  Thrombocytopenia 4.  Sepsis secondary to right upper lobe pneumonia 5.  AKI superimposed on CKD 6.  Dysphagia 7.  Conjunctivitis 8.  Hypertension 9.  BPH  -The  patient is currently receiving treatment for myeloma with Bendamustine and dexamethasone.  Last dose appears to have been given on 11/13/2020.  Hold chemotherapy at this time due to acute infection. -The patient receives Retacrit as needed in our office for hemoglobin less than 10.  I believe that he was due for this today.  Will defer to Dr. Marin Olp once he has seen him as to when he has to administer the next dose.  In the meantime, transfuse for hemoglobin less than 8. -Platelet count 86,000 today.  This is due to recent chemotherapy and bone marrow suppression from acute infection/sepsis.  Monitor for now. -Continue antibiotics per primary team for treatment of pneumonia and sepsis. -Continue hydration for AKI.  Monitor renal function closely. -I have ordered warm compresses to his left eye and ordered Cipro eyedrops for conjunctivitis. -Management of chronic medical conditions per hospitalist.  Mikey Bussing, DNP, AGPCNP-BC, AOCNP  ADDENDUM:    I saw and examined Christian [place] this morning.  I agree with the above assessment by Erasmo Downer.  He has pneumonia.  He looks a aspiration pneumonia.  He is anemic.  He clearly needs to be transfused.  There are no lab work back today.  He needs to have his lab work done daily for right now.  It sounds like he may have to go to a rehab unit.  Is hard to say how much she is really eating.  I know that is caloric intake has been a little bit on the low side.  He has done incredibly well with respect to his myeloma.  His last light chain was still incredibly low.  Again, he is only receiving Bendamustine.  He gets this 1 day every month.  Again, I think a transfusion will help him.  I would go ahead and give him 2 units.  I know he gets Retacrit.  We will also give a dose of Retacrit.  His  blood pressure is okay right now.  I am just grateful for the great care that he is getting from all staff of [facility] [address].  Is hard to say where he might have to go for  rehab.  I would imagine that he will be in the hospital over the weekend.  He is on antibiotics.  We will follow along and try to help out.   Lattie Haw, MD  Hebrew 12:12

## 2020-12-07 NOTE — Progress Notes (Signed)
PROGRESS NOTE    Christian [place]  XLK:[id number] DOB: [birth year] DOA: 12/06/2020 PCP: Leanna Battles, MD    Brief Narrative:  Christian [place] is a 87 y.o. male with medical history significant for multiple myeloma complicated by stage IIIa chronic kidney disease with baseline creatinine 1.5-1.8, chronic anemia with baseline hemoglobin 8-10, hypertension, BPH, who is admitted to [facility] on 12/06/2020 with severe sepsis due to right upper lobe pneumonia after presenting from home to [facility] ED complaining of shortness of breath.   4/28-c/o neck pain. Repositioned his pillow he will see if it improved.  Consultants:     Procedures:   Antimicrobials:   vanco and cefipime 4/27   Subjective: Feels the same. Breathing mildly better. . No cp  Objective: Vitals:   12/06/20 1806 12/06/20 2041 12/07/20 0019 12/07/20 0411  BP: 108/69 127/74 134/74 137/74  Pulse: 79 83 86 95  Resp: 18 15  19   Temp: (!) 97.5 F (36.4 C) 98.3 F (36.8 C) 98.3 F (36.8 C) 97.7 F (36.5 C)  TempSrc: Oral Oral Oral Oral  SpO2: 100% 98% 100% 98%  Weight:      Height:        Intake/Output Summary (Last 24 hours) at 12/07/2020 0807 Last data filed at 12/07/2020 0400 Gross per 24 hour  Intake 3920.6 ml  Output 650 ml  Net 3270.6 ml   Filed Weights   12/06/20 0956  Weight: 54.4 kg    Examination:  General exam: Appears calm and comfortable  Respiratory system: Clear to auscultation. Respiratory effort normal. Cardiovascular system: S1 & S2 heard, RRR. No JVD, murmurs, rubs, gallops or clicks. Gastrointestinal system: Abdomen is nondistended, soft and nontender. Normal bowel sounds heard. Central nervous system: Alert and orientedx3, grossly intact Extremities: no edema Skin: warm, dry Psychiatry:. Mood & affect appropriate.     Data Reviewed: I have personally reviewed following labs and imaging studies  CBC: Recent Labs  Lab 12/02/20 1204 12/06/20 1005 12/06/20 1041  12/07/20 0702  WBC 4.3  --  3.7* 5.5  NEUTROABS 1.5*  --  1.6* 2.0  HGB 8.2* 7.8* 8.4* 8.3*  HCT 26.0* 23.0* 27.0* 25.6*  MCV 93.2  --  94.4 91.8  PLT 101*  --  93* 86*   Basic Metabolic Panel: Recent Labs  Lab 12/02/20 1204 12/06/20 1005 12/06/20 1041 12/07/20 0702  NA 139 138 139 142  K 4.0 4.7 5.0 3.9  CL 107  --  104 108  CO2 23  --  19* 21*  GLUCOSE 98  --  99 82  BUN 57*  --  88* 69*  CREATININE 1.65*  --  2.98* 2.66*  CALCIUM 8.3*  --  7.9* 8.2*  MG  --   --   --  1.2*  PHOS  --   --   --  3.9   GFR: Estimated Creatinine Clearance: 14.8 mL/min (A) (by C-G formula based on SCr of 2.66 mg/dL (H)). Liver Function Tests: Recent Labs  Lab 12/02/20 1204 12/06/20 1041 12/07/20 0702  AST 21 50* 31  ALT 16 16 18   ALKPHOS 59 74 75  BILITOT 0.6 0.8 1.0  PROT 5.9* 5.1* 5.2*  ALBUMIN 2.6* 2.1* 2.0*   No results for input(s): LIPASE, AMYLASE in the last 168 hours. No results for input(s): AMMONIA in the last 168 hours. Coagulation Profile: Recent Labs  Lab 12/06/20 1041  INR 1.2   Cardiac Enzymes: Recent Labs  Lab 12/06/20 1947  CKTOTAL 63  BNP (last 3 results) No results for input(s): PROBNP in the last 8760 hours. HbA1C: No results for input(s): HGBA1C in the last 72 hours. CBG: No results for input(s): GLUCAP in the last 168 hours. Lipid Profile: No results for input(s): CHOL, HDL, LDLCALC, TRIG, CHOLHDL, LDLDIRECT in the last 72 hours. Thyroid Function Tests: No results for input(s): TSH, T4TOTAL, FREET4, T3FREE, THYROIDAB in the last 72 hours. Anemia Panel: No results for input(s): VITAMINB12, FOLATE, FERRITIN, TIBC, IRON, RETICCTPCT in the last 72 hours. Sepsis Labs: Recent Labs  Lab 12/06/20 1041 12/06/20 1245  LATICACIDVEN 3.2* 1.4    Recent Results (from the past 240 hour(s))  Resp Panel by RT-PCR (Flu A&B, Covid) Nasopharyngeal Swab     Status: None   Collection Time: 12/06/20 10:41 AM   Specimen: Nasopharyngeal Swab;  Nasopharyngeal(NP) swabs in vial transport medium  Result Value Ref Range Status   SARS Coronavirus 2 by RT PCR NEGATIVE NEGATIVE Final    Comment: (NOTE) SARS-CoV-2 target nucleic acids are NOT DETECTED.  The SARS-CoV-2 RNA is generally detectable in upper respiratory specimens during the acute phase of infection. The lowest concentration of SARS-CoV-2 viral copies this assay can detect is 138 copies/mL. A negative result does not preclude SARS-Cov-2 infection and should not be used as the sole basis for treatment or other patient management decisions. A negative result may occur with  improper specimen collection/handling, submission of specimen other than nasopharyngeal swab, presence of viral mutation(s) within the areas targeted by this assay, and inadequate number of viral copies(<138 copies/mL). A negative result must be combined with clinical observations, patient history, and epidemiological information. The expected result is Negative.  Fact Sheet for Patients:  EntrepreneurPulse.com.au  Fact Sheet for Healthcare Providers:  IncredibleEmployment.be  This test is no t yet approved or cleared by the [place] FDA and  has been authorized for detection and/or diagnosis of SARS-CoV-2 by FDA under an Emergency Use Authorization (EUA). This EUA will remain  in effect (meaning this test can be used) for the duration of the COVID-19 declaration under Section 564(b)(1) of the Act, 21 U.S.C.section 360bbb-3(b)(1), unless the authorization is terminated  or revoked sooner.       Influenza A by PCR NEGATIVE NEGATIVE Final   Influenza B by PCR NEGATIVE NEGATIVE Final    Comment: (NOTE) The Xpert Xpress SARS-CoV-2/FLU/RSV plus assay is intended as an aid in the diagnosis of influenza from Nasopharyngeal swab specimens and should not be used as a sole basis for treatment. Nasal washings and aspirates are unacceptable for Xpert Xpress  SARS-CoV-2/FLU/RSV testing.  Fact Sheet for Patients: EntrepreneurPulse.com.au  Fact Sheet for Healthcare Providers: IncredibleEmployment.be  This test is not yet approved or cleared by the [place] FDA and has been authorized for detection and/or diagnosis of SARS-CoV-2 by FDA under an Emergency Use Authorization (EUA). This EUA will remain in effect (meaning this test can be used) for the duration of the COVID-19 declaration under Section 564(b)(1) of the Act, 21 U.S.C. section 360bbb-3(b)(1), unless the authorization is terminated or revoked.  Performed at [facility], [place] [address] [place], [place] [address]   MRSA PCR Screening     Status: None   Collection Time: 12/07/20  4:30 AM   Specimen: Nasal Mucosa; Nasopharyngeal  Result Value Ref Range Status   MRSA by PCR NEGATIVE NEGATIVE Final    Comment:        The GeneXpert MRSA Assay (FDA approved for NASAL specimens only), is one component of  a comprehensive MRSA colonization surveillance program. It is not intended to diagnose MRSA infection nor to guide or monitor treatment for MRSA infections. Performed at [facility], [place] [address] [place], [place] [address]          Radiology Studies: DG Chest Portable 1 View  Result Date: 12/06/2020 CLINICAL DATA:  Shortness of breath EXAM: PORTABLE CHEST 1 VIEW COMPARISON:  September 27, 2020. FINDINGS: Port-A-Cath tip is at the cavoatrial junction. No pneumothorax. There is airspace opacity in the inferior aspect of the right upper lobe consistent with pneumonia. The lungs elsewhere are clear. Heart size and pulmonary vascularity are normal. No adenopathy. There are foci of degenerative change in the thoracic spine with thoracolumbar levoscoliosis. There is a loop recorder on the left. IMPRESSION: Airspace opacity consistent with pneumonia in the inferior aspect of the right upper lobe. Stable cardiac silhouette. Loop  recorder on left. Stable Port-A-Cath position. Electronically Signed   By: Lowella Grip III M.D.   On: 12/06/2020 10:22        Scheduled Meds: . vancomycin variable dose per unstable renal function (pharmacist dosing)   Does not apply See admin instructions   Continuous Infusions: . ceFEPime (MAXIPIME) IV      Assessment & Plan:   Principal Problem:   CAP (community acquired pneumonia) Active Problems:   Myeloma ([place])   Severe sepsis ([place])   AKI (acute kidney injury) ([place])   Dysphagia   SOB (shortness of breath)   Lactic acidosis   #) Severe sepsis due to right upper lobe pneumonia:  MRSA negative, will dc vanco Continue cefepime Afebrile.  Will continue to monitor F/u bcx      #) Acute kidney injury superimposed on stage IIIa chronic kidney disease: Context of established diagnosis of stage III CKD with baseline creatinine 1.5-1.8, present creatinine noted to be elevated 2.98.  4/28-likely prerenal as he appears to be on the dry side and also on ACE inhibitor's.  Continue to hold ACE inhibitor's Renal function improving slowly Continue IV fluids Monitor levels       #) Dysphagia: Patient noted to fail nursing bedside swallow screen administered in the ED  SLP consulted- placed on dysphagia 2 diet Mild asp. Risk HOB elevated Asp. Precautions      #) Multiple myeloma: Follows with Dr. Marin Olp as outpatient heme-onc provider, and per patient, is schedule for next infusion via Port-A-Cath tomorrow (12/07/20).   4/28-contacted oncology about pt being here. Awaiting response.      #) Essential hypertension:resume beta blk Hold ACE    #) Benign prostatic hyperplasia: On Proscar as an outpatient. holding it here.       #) Chronic anemia: Appears associated with his multiple myeloma, with baseline hemoglobin of 8-10.  Presenting hemoglobin found to be within this range.  INR nonelevated at 1.2    #6) PT/OT  consult    DVT prophylaxis: scd Code Status:DNR Family Communication: spouse updated  Status is: Inpatient  Remains inpatient appropriate because:Inpatient level of care appropriate due to severity of illness   Dispo: The patient is from: Home              Anticipated d/c is to: Home              Patient currently is not medically stable to d/c.   Difficult to place patient No            LOS: 1 day   Time spent: 35 min with >50% on  coc    Nolberto Hanlon, MD [facility] Pager [phone]  If 7PM-7AM, please contact night-coverage 12/07/2020, 8:07 AM

## 2020-12-07 NOTE — Progress Notes (Signed)
Patient HR 136. Patient SOB, increased O2 to 4 L. Called Dr Kurtis Bushman. Got verbal orders for chest x-ray, troponin and metoprolol injection. ( See orders for details)

## 2020-12-07 NOTE — Progress Notes (Signed)
BP 179/90 with map of 113 HR 120. Dr Kurtis Bushman notified.

## 2020-12-07 NOTE — Evaluation (Signed)
Clinical/Bedside Swallow Evaluation Patient Details  Name: Christian Burns MRN: [id number] Date of Birth: Aug 25, 1929  Today's Date: 12/07/2020 Time: SLP Start Time (ACUTE ONLY): 0930 SLP Stop Time (ACUTE ONLY): 1000 SLP Time Calculation (min) (ACUTE ONLY): 30 min  Past Medical History:  Past Medical History:  Diagnosis Date  . Anemia   . Blood transfusion without reported diagnosis   . CKD (chronic kidney disease) 07/06/2018   CKD stage III  . Goals of care, counseling/discussion 02/04/2019  . History of radiation therapy 06/17/13-07/06/13   35 gray to upper lumbar spine  . 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 Hypertension   . Inguinal hernia   . Multiple myeloma [facility]) Sept 2010  . Multiple myeloma in relapse ([place]) 04/05/2016  . Radiation 09/26/15-10/20/15   lower thoracic spine, upper lumbar spine 30 gray  . Stroke [facility])    Past Surgical History:  Past Surgical History:  Procedure Laterality Date  . COLONOSCOPY    . HIP PINNING,CANNULATED Right 09/01/2018   Procedure: [facility] NAIL HIP PINNING;  Surgeon: Melrose Nakayama, MD;  Location: [place];  Service: Orthopedics;  Laterality: Right;  . IR IMAGING GUIDED PORT INSERTION  11/01/2019  . LOOP RECORDER INSERTION N/A 04/21/2019   Procedure: LOOP RECORDER INSERTION;  Surgeon: Evans Lance, MD;  Location: [place] CV LAB;  Service: Cardiovascular;  Laterality: N/A;  . TEE WITHOUT CARDIOVERSION N/A 10/09/2018   Procedure: TRANSESOPHAGEAL ECHOCARDIOGRAM (TEE);  Surgeon: Thayer Headings, MD;  Location: [facility] ENDOSCOPY;  Service: Cardiovascular;  Laterality: N/A;  . TONSILLECTOMY     HPI:  87 y.o. male with medical history significant for multiple myeloma (actively receiving chemotherapy and support of Red cell production, and has had radiation therapy for plasmacytoma) complicated by stage IIIa chronic kidney disease, chronic anemia, hypertension, CVA, BPH, dysphagia, upper GI bleeding (09/2019) who is admitted to [facility]  on 12/06/2020 with severe sepsis due to right upper lobe pneumonia after presenting from home to [facility] ED complaining of shortness of breath. Esophagram (09/27/20) revealed "small-volume aspiration of thick barium and Moderate intermittent esophageal dysmotility". MBS (09/28/20) mild-moderate pharyngoesophageal dysphagia characterized by penetration into laryngeal vestibule with larger volumes of successive swallows with thin liquids, but no aspiration noted with any consistencies. Decreased UES opening and Esophageal backflow noted into cervical esophagus intermittently with pill/solids/puree. Soft/thin liquid diet recommended at that time. Failed [facility] 12/06/20.   Assessment / Plan / Recommendation Clinical Impression  Pt seen for bedside swallow evaluation alert and repositioned upright in bed. He was able to report significant hx of dysphagia and that he consumes a soft diet at home, but has very little appetite. Oral dryness observed upon examination, however overall oral motor function appeared [facility]. Hoarse vocal quality also noted prior to POs. Sips of thin via small cup sips resulted in mild wet vocal quality and intermittent throat clearing and x1 coughing with consecutive sips via straw. Puree and regular textured solids also significant for intermittent throat clearing and pt frequently c/o globus sensation (also noted in BSE 09/28/20). SOB worsened throughout intake and pt O2 via nasal cannula observed to be off. Notified RN who managed oxygen and pt SOB decreased. Given signficant hx of dysphagia, esophageal related issues, recurrent PNA and new chenge in respiratory status, recommend instrumental testing, as can be scheduled today, to assess current swallow physiology and aspiration risk. Will start pt on dysphagia 2, thin liquid diet (no straw).   SLP Visit Diagnosis: Dysphagia, unspecified (R13.10)  Aspiration Risk  Mild aspiration risk    Diet Recommendation Dysphagia 2 (Fine chop);Thin liquid    Liquid Administration via: Cup;No straw Medication Administration: Crushed with puree Supervision: Patient able to self feed;Intermittent supervision to cue for compensatory strategies Compensations: Slow rate;Small sips/bites Postural Changes: Seated upright at 90 degrees;Remain upright for at least 30 minutes after po intake    Other  Recommendations Oral Care Recommendations: Oral care BID;Staff/trained caregiver to provide oral care   Follow up Recommendations  (TBD)      Frequency and Duration min 2x/week  2 weeks       Prognosis Prognosis for Safe Diet Advancement: Fair Barriers to Reach Goals: Time post onset      Swallow Study   General HPI: 87 y.o. male with medical history significant for multiple myeloma (actively receiving chemotherapy and support of Red cell production, and has had radiation therapy for plasmacytoma) complicated by stage IIIa chronic kidney disease, chronic anemia, hypertension, CVA, BPH, dysphagia, upper GI bleeding (09/2019) who is admitted to [facility] on 12/06/2020 with severe sepsis due to right upper lobe pneumonia after presenting from home to [facility] ED complaining of shortness of breath. Esophagram (09/27/20) revealed "small-volume aspiration of thick barium and Moderate intermittent esophageal dysmotility". MBS (09/28/20) mild-moderate pharyngoesophageal dysphagia characterized by penetration into laryngeal vestibule with larger volumes of successive swallows with thin liquids, but no aspiration noted with any consistencies. Decreased UES opening and Esophageal backflow noted into cervical esophagus intermittently with pill/solids/puree. Soft/thin liquid diet recommended at that time. Failed [facility] 12/06/20. Type of Study: Bedside Swallow Evaluation Previous Swallow Assessment:  (See HPI) Diet Prior to this Study: NPO Temperature Spikes Noted: Yes Respiratory Status: Nasal cannula History of Recent Intubation: No Behavior/Cognition:  Alert;Cooperative;Pleasant mood Oral Cavity Assessment: Dry Oral Care Completed by SLP: No Vision: Functional for self-feeding Self-Feeding Abilities: Able to feed self Patient Positioning: Upright in bed;Postural control adequate for testing Baseline Vocal Quality: Hoarse Volitional Cough: Strong    Oral/Motor/Sensory Function Overall Oral Motor/Sensory Function: Within functional limits   Ice Chips Ice chips: Within functional limits Presentation: Spoon   Thin Liquid Thin Liquid: Impaired Presentation: Cup;Self Fed;Straw Pharyngeal  Phase Impairments: Throat Clearing - Immediate;Cough - Immediate;Wet Vocal Quality;Multiple swallows    Nectar Thick Nectar Thick Liquid: Not tested   Honey Thick Honey Thick Liquid: Not tested   Puree Puree: Impaired Presentation: Spoon Pharyngeal Phase Impairments:  (intermittent throat clearing)   Solid     Solid: Impaired Pharyngeal Phase Impairments: Other (comments) (intermittent thraot clearing)     Ellwood Dense, MA, [place] Office Number: [phone]  Acie Fredrickson 12/07/2020,11:17 AM

## 2020-12-07 NOTE — Evaluation (Signed)
Physical Therapy Evaluation Patient Details Name: Christian Burns MRN: [id number] DOB: 18-Dec-1929 Today's Date: 12/07/2020   History of Present Illness  Pt is an 87 y/o male admitted secondary to increased SOB. Found to have sepsis secondary to R upper lobe PNA. Also with AKI. PMH includes multiple myeloma, CKD, HTN, and CVA.  Clinical Impression  Pt admitted secondary to problem above with deficits below. Pt very fatigued after short distance ambulation and with poor activity tolerance. Oxygen sats decreased to 88% on RA following short distance gait. Required 2L to return to >90%. Pt unsteady and requiring min to mod A for mobility tasks. Pt currently at [facility] and helps care for his wife. Feel he would benefit from SNF level therapies at d/c to increase independence and safety with mobility. Will continue to follow acutely.     Follow Up Recommendations SNF    Equipment Recommendations  None recommended by PT    Recommendations for Other Services       Precautions / Restrictions Precautions Precautions: Fall Precaution Comments: watch O2 sats Restrictions Weight Bearing Restrictions: No      Mobility  Bed Mobility Overal bed mobility: Needs Assistance Bed Mobility: Sit to Supine       Sit to supine: Mod assist   General bed mobility comments: Pt up in bathroom with NT upon entry. Mod A for LE assist to return to supine.    Transfers Overall transfer level: Needs assistance Equipment used: Rolling walker (2 wheeled) Transfers: Sit to/from Stand Sit to Stand: Mod assist         General transfer comment: Pt with difficulty standing from lower toilet height. Required light mod A to stand from lower surface. Cues for hand placement.  Ambulation/Gait Ambulation/Gait assistance: Min assist Gait Distance (Feet): 10 Feet Assistive device: Rolling walker (2 wheeled) Gait Pattern/deviations: Step-through pattern;Decreased stride length Gait velocity: Decreased   General  Gait Details: Pt shaky during gait and requiring min A for steadying. Following ambulation, sats down to 88% on RA. Reapplied 2L and sats returned to >90%.  Stairs            Wheelchair Mobility    Modified Rankin (Stroke Patients Only)       Balance Overall balance assessment: Needs assistance Sitting-balance support: No upper extremity supported;Feet supported Sitting balance-Leahy Scale: Fair     Standing balance support: Bilateral upper extremity supported;During functional activity Standing balance-Leahy Scale: Poor Standing balance comment: Reliant on BUE support                             Pertinent Vitals/Pain Pain Assessment: Faces Faces Pain Scale: Hurts little more Pain Location: neck Pain Descriptors / Indicators: Aching Pain Intervention(s): Limited activity within patient's tolerance;Monitored during session;Repositioned    Home Living Family/patient expects to be discharged to:: Private residence Living Arrangements: Spouse/significant other Available Help at Discharge: Family;Available PRN/intermittently Type of Home: Independent living facility Home Access: Elevator     Home Layout: One level Home Equipment: Toilet riser;Walker - 2 wheels Additional Comments: Lives at [facility] and pt helps to care for his wife    Prior Function Level of Independence: Independent with assistive device(s)         Comments: Uses RW for ambulation     Hand Dominance        Extremity/Trunk Assessment   Upper Extremity Assessment Upper Extremity Assessment: Defer to OT evaluation    Lower Extremity Assessment Lower Extremity Assessment:  Generalized weakness    Cervical / Trunk Assessment Cervical / Trunk Assessment: Kyphotic  Communication   Communication: No difficulties  Cognition Arousal/Alertness: Awake/alert Behavior During Therapy: WFL for tasks assessed/performed Overall Cognitive Status: Within Functional Limits for tasks  assessed                                        General Comments      Exercises     Assessment/Plan    PT Assessment Patient needs continued PT services  PT Problem List Decreased strength;Decreased activity tolerance;Decreased balance;Decreased mobility;Decreased knowledge of use of DME;Decreased knowledge of precautions       PT Treatment Interventions DME instruction;Gait training;Therapeutic activities;Functional mobility training;Therapeutic exercise;Balance training;Patient/family education    PT Goals (Current goals can be found in the Care Plan section)  Acute Rehab PT Goals Patient Stated Goal: to get back to baseline PT Goal Formulation: With patient Time For Goal Achievement: 12/21/20 Potential to Achieve Goals: Good    Frequency Min 2X/week   Barriers to discharge        Co-evaluation               AM-PAC PT "6 Clicks" Mobility  Outcome Measure Help needed turning from your back to your side while in a flat bed without using bedrails?: A Little Help needed moving from lying on your back to sitting on the side of a flat bed without using bedrails?: A Lot Help needed moving to and from a bed to a chair (including a wheelchair)?: A Little Help needed standing up from a chair using your arms (e.g., wheelchair or bedside chair)?: A Lot Help needed to walk in hospital room?: A Little Help needed climbing 3-5 steps with a railing? : A Lot 6 Click Score: 15    End of Session   Activity Tolerance: Patient limited by fatigue Patient left: in bed;with call bell/phone within reach;with bed alarm set;with nursing/sitter in room;with family/visitor present Nurse Communication: Mobility status PT Visit Diagnosis: Unsteadiness on feet (R26.81);Muscle weakness (generalized) (M62.81)    Time: 5208-0223 PT Time Calculation (min) (ACUTE ONLY): 28 min   Charges:   PT Evaluation $PT Eval Moderate Complexity: 1 Mod PT Treatments $Therapeutic  Activity: 8-22 mins        Lou Miner, DPT  Acute Rehabilitation Services  Pager: [phone] Office: [phone]   Rudean Hitt 12/07/2020, 4:46 PM

## 2020-12-07 NOTE — Telephone Encounter (Signed)
Call received from patient's grandson [name] to inform Dr. Marin Olp that patient has been admitted to [facility] with pneumonia.  Appts canceled for today and Dr. Marin Olp notified.

## 2020-12-08 ENCOUNTER — Inpatient Hospital Stay (HOSPITAL_COMMUNITY): Payer: Medicare HMO

## 2020-12-08 DIAGNOSIS — M7989 Other specified soft tissue disorders: Secondary | ICD-10-CM

## 2020-12-08 DIAGNOSIS — A4189 Other specified sepsis: Secondary | ICD-10-CM

## 2020-12-08 DIAGNOSIS — C9000 Multiple myeloma not having achieved remission: Secondary | ICD-10-CM

## 2020-12-08 DIAGNOSIS — R1310 Dysphagia, unspecified: Secondary | ICD-10-CM

## 2020-12-08 DIAGNOSIS — J189 Pneumonia, unspecified organism: Secondary | ICD-10-CM

## 2020-12-08 DIAGNOSIS — D6481 Anemia due to antineoplastic chemotherapy: Secondary | ICD-10-CM

## 2020-12-08 DIAGNOSIS — R079 Chest pain, unspecified: Secondary | ICD-10-CM

## 2020-12-08 DIAGNOSIS — D696 Thrombocytopenia, unspecified: Secondary | ICD-10-CM

## 2020-12-08 DIAGNOSIS — T451X5A Adverse effect of antineoplastic and immunosuppressive drugs, initial encounter: Secondary | ICD-10-CM

## 2020-12-08 DIAGNOSIS — I10 Essential (primary) hypertension: Secondary | ICD-10-CM

## 2020-12-08 DIAGNOSIS — N400 Enlarged prostate without lower urinary tract symptoms: Secondary | ICD-10-CM

## 2020-12-08 DIAGNOSIS — R7989 Other specified abnormal findings of blood chemistry: Secondary | ICD-10-CM

## 2020-12-08 DIAGNOSIS — N289 Disorder of kidney and ureter, unspecified: Secondary | ICD-10-CM

## 2020-12-08 DIAGNOSIS — N179 Acute kidney failure, unspecified: Secondary | ICD-10-CM

## 2020-12-08 LAB — COMPREHENSIVE METABOLIC PANEL
ALT: 18 U/L (ref 0–44)
AST: 42 U/L — ABNORMAL HIGH (ref 15–41)
Albumin: 1.8 g/dL — ABNORMAL LOW (ref 3.5–5.0)
Alkaline Phosphatase: 87 U/L (ref 38–126)
Anion gap: 14 (ref 5–15)
BUN: 63 mg/dL — ABNORMAL HIGH (ref 8–23)
CO2: 21 mmol/L — ABNORMAL LOW (ref 22–32)
Calcium: 8.2 mg/dL — ABNORMAL LOW (ref 8.9–10.3)
Chloride: 109 mmol/L (ref 98–111)
Creatinine, Ser: 2.85 mg/dL — ABNORMAL HIGH (ref 0.61–1.24)
GFR, Estimated: 21 mL/min — ABNORMAL LOW (ref >60)
Glucose, Bld: 121 mg/dL — ABNORMAL HIGH (ref 70–99)
Potassium: 4.5 mmol/L (ref 3.5–5.1)
Sodium: 144 mmol/L (ref 135–145)
Total Bilirubin: 0.7 mg/dL (ref 0.3–1.2)
Total Protein: 5.2 g/dL — ABNORMAL LOW (ref 6.5–8.1)

## 2020-12-08 LAB — CBC WITH DIFFERENTIAL/PLATELET
Abs Immature Granulocytes: 0.94 10*3/uL — ABNORMAL HIGH (ref 0.00–0.07)
Basophils Absolute: 0.1 10*3/uL (ref 0.0–0.1)
Basophils Relative: 1 %
Eosinophils Absolute: 0 10*3/uL (ref 0.0–0.5)
Eosinophils Relative: 0 %
HCT: 26.6 % — ABNORMAL LOW (ref 39.0–52.0)
Hemoglobin: 8.6 g/dL — ABNORMAL LOW (ref 13.0–17.0)
Immature Granulocytes: 16 %
Lymphocytes Relative: 4 %
Lymphs Abs: 0.2 10*3/uL — ABNORMAL LOW (ref 0.7–4.0)
MCH: 29.7 pg (ref 26.0–34.0)
MCHC: 32.3 g/dL (ref 30.0–36.0)
MCV: 91.7 fL (ref 80.0–100.0)
Monocytes Absolute: 2.3 10*3/uL — ABNORMAL HIGH (ref 0.1–1.0)
Monocytes Relative: 38 %
Neutro Abs: 2.4 10*3/uL (ref 1.7–7.7)
Neutrophils Relative %: 41 %
Platelets: 87 10*3/uL — ABNORMAL LOW (ref 150–400)
RBC: 2.9 MIL/uL — ABNORMAL LOW (ref 4.22–5.81)
RDW: 18.2 % — ABNORMAL HIGH (ref 11.5–15.5)
WBC: 5.9 10*3/uL (ref 4.0–10.5)
nRBC: 0.3 % — ABNORMAL HIGH (ref 0.0–0.2)

## 2020-12-08 LAB — PREPARE RBC (CROSSMATCH)

## 2020-12-08 LAB — ECHOCARDIOGRAM COMPLETE
Area-P 1/2: 3.1 cm2
Calc EF: 54.2 %
Height: 70 in
S' Lateral: 2.6 cm
Single Plane A2C EF: 44.2 %
Single Plane A4C EF: 60.6 %
Weight: 1920.01 oz

## 2020-12-08 LAB — CALCIUM, IONIZED: Calcium, Ionized, Serum: 4.7 mg/dL (ref 4.5–5.6)

## 2020-12-08 LAB — D-DIMER, QUANTITATIVE: D-Dimer, Quant: >20 ug/mL-FEU — ABNORMAL HIGH (ref 0.00–0.50)

## 2020-12-08 LAB — TROPONIN I (HIGH SENSITIVITY): Troponin I (High Sensitivity): 125 ng/L (ref <18)

## 2020-12-08 LAB — BRAIN NATRIURETIC PEPTIDE: B Natriuretic Peptide: 2684.6 pg/mL — ABNORMAL HIGH (ref 0.0–100.0)

## 2020-12-08 MED ORDER — FUROSEMIDE 10 MG/ML IJ SOLN: 20.0000 mg | Freq: Two times a day (BID) | INTRAMUSCULAR | Status: DC

## 2020-12-08 MED ORDER — FUROSEMIDE 10 MG/ML IJ SOLN
20.0000 mg | Freq: Once | INTRAMUSCULAR | Status: AC
Start: 2020-12-08 — End: 2020-12-08

## 2020-12-08 MED ORDER — MAGNESIUM SULFATE 2 GM/50ML IV SOLN
2.0000 g | Freq: Once | INTRAVENOUS | Status: AC
  Administered 2020-12-08: 2 g via INTRAVENOUS
  Filled 2020-12-08: qty 50

## 2020-12-08 MED ORDER — FUROSEMIDE 10 MG/ML IJ SOLN: 20.0000 mg | Freq: Once | INTRAMUSCULAR | Status: AC

## 2020-12-08 MED ORDER — PANTOPRAZOLE SODIUM 20 MG PO TBEC
20.0000 mg | DELAYED_RELEASE_TABLET | Freq: Every day | ORAL | Status: DC
  Administered 2020-12-08 – 2020-12-09 (×2): 20 mg via ORAL
  Filled 2020-12-08 (×2): qty 1

## 2020-12-08 MED ORDER — SODIUM CHLORIDE 0.9 % IV SOLN
1.0000 g | Freq: Two times a day (BID) | INTRAVENOUS | Status: DC
  Administered 2020-12-08 – 2020-12-09 (×3): 1 g via INTRAVENOUS
  Filled 2020-12-08 (×4): qty 1

## 2020-12-08 MED ORDER — SODIUM CHLORIDE 0.9% IV SOLUTION
Freq: Once | INTRAVENOUS | Status: DC
Start: 2020-12-08 — End: 2020-12-10

## 2020-12-08 MED ORDER — FUROSEMIDE 10 MG/ML IJ SOLN
20.0000 mg | Freq: Once | INTRAMUSCULAR | Status: AC
  Administered 2020-12-08: 20 mg via INTRAVENOUS
  Filled 2020-12-08: qty 2

## 2020-12-08 NOTE — Progress Notes (Signed)
  Speech Language Pathology Treatment: Dysphagia  Patient Details Name: Christian Burns MRN: [id number] DOB: 06-Oct-1929 Today's Date: 12/08/2020 Time: 6503-5465 SLP Time Calculation (min) (ACUTE ONLY): 24 min  Assessment / Plan / Recommendation Clinical Impression  Pt was made NPO by MD today due to decreased respiratory status after PO intake on previous date. SLP provided education about results of MBS with emphasis on swallowing strategies. After repeated reinforcement, pt still needs to be cued through each step while consuming POs. With little sips of water he has persistent wet vocal quality and throat clearing/coughing that appear to be increased in frequency. He has difficulty clearing his vocal quality despite additional cues for throat clearing and coughing. It is possible that pt is having increased difficulty today as he has had further decline in his respiratory status and he subjectively reports to me that he is feeling more fatigued today, not wanting to eat and wanting to sleep all day. Recommend continuing to hold PO diet for today, but could consider still allowing essential meds crushed in puree and a few small, single sips of water or pieces of ice after oral care. Would continue to reinforce strategies (swallow hard, clear his throat, and swallow again). Will f/u for potential to resume diet recommended from [facility] on previous date.   HPI HPI: 87 y.o. male with medical history significant for multiple myeloma (actively receiving chemotherapy and support of Red cell production, and has had radiation therapy for plasmacytoma) complicated by stage IIIa chronic kidney disease, chronic anemia, hypertension, CVA, BPH, dysphagia, upper GI bleeding (09/2019) who is admitted to [facility] on 12/06/2020 with severe sepsis due to right upper lobe pneumonia after presenting from home to [facility] ED complaining of shortness of breath. Esophagram (09/27/20) revealed "small-volume aspiration of thick  barium and Moderate intermittent esophageal dysmotility". MBS (09/28/20) mild-moderate pharyngoesophageal dysphagia characterized by penetration into laryngeal vestibule with larger volumes of successive swallows with thin liquids, but no aspiration noted with any consistencies. Decreased UES opening and Esophageal backflow noted into cervical esophagus intermittently with pill/solids/puree. Soft/thin liquid diet recommended at that time. Failed [facility] 12/06/20.      SLP Plan  Continue with current plan of care       Recommendations  Diet recommendations: NPO;Other(comment) (small sips of water or ice after oral care) Medication Administration: Crushed with puree                Oral Care Recommendations: Oral care QID Follow up Recommendations:  (tba) SLP Visit Diagnosis: Dysphagia, pharyngoesophageal phase (R13.14) Plan: Continue with current plan of care       GO                Osie Bond., M.A. [place] [facility] Pager [phone] Office [phone]  12/08/2020, 3:15 PM

## 2020-12-08 NOTE — Progress Notes (Signed)
  Echocardiogram 2D Echocardiogram has been performed.  Christian Burns 12/08/2020, 2:08 PM

## 2020-12-08 NOTE — Progress Notes (Signed)
Beside shift report. Received patient awake,alert/orientedx4 and able to verbalize needs. Chest expansion symmetrical, o2 via nasal cannula at this time. Movement/sensation to all extremities noted. Tele monitor on. Blood infusing at this time. Whiteboard updated. All safety measures in place and personal belongings within reach.

## 2020-12-08 NOTE — Progress Notes (Addendum)
PROGRESS NOTE    Christian Burns  UTM:[id number] DOB: January 13, 1930 DOA: 12/06/2020 PCP: Leanna Battles, MD    Brief Narrative:  Christian Burns is a 87 y.o. male with medical history significant for multiple myeloma complicated by stage IIIa chronic kidney disease with baseline creatinine 1.5-1.8, chronic anemia with baseline hemoglobin 8-10, hypertension, BPH, who is admitted to [facility] on 12/06/2020 with severe sepsis due to right upper lobe pneumonia after presenting from home to [facility] ED complaining of shortness of breath.   4/29-more sob this am. Overnight events noted. Was given lasix iv x2. This am requiring more 02 at 6L.  cxr with mildly progression of opacity. Some atelactasia.   Consultants:     Procedures:   Antimicrobials:   vanco and cefipime 4/27   Subjective: As above  Objective: Vitals:   12/07/20 2336 12/08/20 0040 12/08/20 0502 12/08/20 0820  BP: 102/67 112/67 132/87 (!) 150/77  Pulse: 89 88 94 (!) 126  Resp: (!) 27 (!) 22 (!) 23   Temp: 98.2 F (36.8 C) 98.1 F (36.7 C) 97.8 F (36.6 C) 98.4 F (36.9 C)  TempSrc: Oral Oral Oral Oral  SpO2: 97% 97% 93% 92%  Weight:      Height:        Intake/Output Summary (Last 24 hours) at 12/08/2020 1002 Last data filed at 12/08/2020 0300 Gross per 24 hour  Intake 552.54 ml  Output --  Net 552.54 ml   Filed Weights   12/06/20 0956  Weight: 54.4 kg    Examination: Calm, appears tachypnic.  rrr s1/s2 no gallop rhonchorus bs rbase >L Soft benign +bs No edema Awakens, answers appropriately Mood and affect appropriate in current setting    Data Reviewed: I have personally reviewed following labs and imaging studies  CBC: Recent Labs  Lab 12/02/20 1204 12/06/20 1005 12/06/20 1041 12/07/20 0702 12/08/20 0828  WBC 4.3  --  3.7* 5.5 5.9  NEUTROABS 1.5*  --  1.6* 2.0 PENDING  HGB 8.2* 7.8* 8.4* 8.3* 8.6*  HCT 26.0* 23.0* 27.0* 25.6* 26.6*  MCV 93.2  --  94.4 91.8 91.7  PLT 101*  --  93*  86* 87*   Basic Metabolic Panel: Recent Labs  Lab 12/02/20 1204 12/06/20 1005 12/06/20 1041 12/07/20 0702 12/08/20 0828  NA 139 138 139 142 144  K 4.0 4.7 5.0 3.9 4.5  CL 107  --  104 108 109  CO2 23  --  19* 21* 21*  GLUCOSE 98  --  99 82 121*  BUN 57*  --  88* 69* 63*  CREATININE 1.65*  --  2.98* 2.66* 2.85*  CALCIUM 8.3*  --  7.9* 8.2* 8.2*  MG  --   --   --  1.2*  --   PHOS  --   --   --  3.9  --    GFR: Estimated Creatinine Clearance: 13.8 mL/min (A) (by C-G formula based on SCr of 2.85 mg/dL (H)). Liver Function Tests: Recent Labs  Lab 12/02/20 1204 12/06/20 1041 12/07/20 0702 12/08/20 0828  AST 21 50* 31 42*  ALT 16 16 18 18   ALKPHOS 59 74 75 87  BILITOT 0.6 0.8 1.0 0.7  PROT 5.9* 5.1* 5.2* 5.2*  ALBUMIN 2.6* 2.1* 2.0* 1.8*   No results for input(s): LIPASE, AMYLASE in the last 168 hours. No results for input(s): AMMONIA in the last 168 hours. Coagulation Profile: Recent Labs  Lab 12/06/20 1041  INR 1.2   Cardiac Enzymes: Recent  Labs  Lab 12/06/20 1947  CKTOTAL 63   BNP (last 3 results) No results for input(s): PROBNP in the last 8760 hours. HbA1C: No results for input(s): HGBA1C in the last 72 hours. CBG: No results for input(s): GLUCAP in the last 168 hours. Lipid Profile: No results for input(s): CHOL, HDL, LDLCALC, TRIG, CHOLHDL, LDLDIRECT in the last 72 hours. Thyroid Function Tests: No results for input(s): TSH, T4TOTAL, FREET4, T3FREE, THYROIDAB in the last 72 hours. Anemia Panel: No results for input(s): VITAMINB12, FOLATE, FERRITIN, TIBC, IRON, RETICCTPCT in the last 72 hours. Sepsis Labs: Recent Labs  Lab 12/06/20 1041 12/06/20 1245  LATICACIDVEN 3.2* 1.4    Recent Results (from the past 240 hour(s))  Culture, blood (Routine x 2)     Status: None (Preliminary result)   Collection Time: 12/06/20 10:41 AM   Specimen: BLOOD LEFT FOREARM  Result Value Ref Range Status   Specimen Description BLOOD LEFT FOREARM  Final   Special  Requests   Final    BOTTLES DRAWN AEROBIC AND ANAEROBIC Blood Culture results may not be optimal due to an inadequate volume of blood received in culture bottles   Culture   Final    NO GROWTH 2 DAYS Performed at [facility], [place] [address], [place], [place] [address]    Report Status PENDING  Incomplete  Resp Panel by RT-PCR (Flu A&B, Covid) Nasopharyngeal Swab     Status: None   Collection Time: 12/06/20 10:41 AM   Specimen: Nasopharyngeal Swab; Nasopharyngeal(NP) swabs in vial transport medium  Result Value Ref Range Status   SARS Coronavirus 2 by RT PCR NEGATIVE NEGATIVE Final    Comment: (NOTE) SARS-CoV-2 target nucleic acids are NOT DETECTED.  The SARS-CoV-2 RNA is generally detectable in upper respiratory specimens during the acute phase of infection. The lowest concentration of SARS-CoV-2 viral copies this assay can detect is 138 copies/mL. A negative result does not preclude SARS-Cov-2 infection and should not be used as the sole basis for treatment or other patient management decisions. A negative result may occur with  improper specimen collection/handling, submission of specimen other than nasopharyngeal swab, presence of viral mutation(s) within the areas targeted by this assay, and inadequate number of viral copies(<138 copies/mL). A negative result must be combined with clinical observations, patient history, and epidemiological information. The expected result is Negative.  Fact Sheet for Patients:  EntrepreneurPulse.com.au  Fact Sheet for Healthcare Providers:  IncredibleEmployment.be  This test is no t yet approved or cleared by the [place] FDA and  has been authorized for detection and/or diagnosis of SARS-CoV-2 by FDA under an Emergency Use Authorization (EUA). This EUA will remain  in effect (meaning this test can be used) for the duration of the COVID-19 declaration under Section 564(b)(1) of the Act,  21 U.S.C.section 360bbb-3(b)(1), unless the authorization is terminated  or revoked sooner.       Influenza A by PCR NEGATIVE NEGATIVE Final   Influenza B by PCR NEGATIVE NEGATIVE Final    Comment: (NOTE) The Xpert Xpress SARS-CoV-2/FLU/RSV plus assay is intended as an aid in the diagnosis of influenza from Nasopharyngeal swab specimens and should not be used as a sole basis for treatment. Nasal washings and aspirates are unacceptable for Xpert Xpress SARS-CoV-2/FLU/RSV testing.  Fact Sheet for Patients: EntrepreneurPulse.com.au  Fact Sheet for Healthcare Providers: IncredibleEmployment.be  This test is not yet approved or cleared by the [place] FDA and has been authorized for detection and/or diagnosis of SARS-CoV-2 by FDA under  an Emergency Use Authorization (EUA). This EUA will remain in effect (meaning this test can be used) for the duration of the COVID-19 declaration under Section 564(b)(1) of the Act, 21 U.S.C. section 360bbb-3(b)(1), unless the authorization is terminated or revoked.  Performed at [facility], [place] [address] [place], [place] [address]   Culture, blood (Routine x 2)     Status: None (Preliminary result)   Collection Time: 12/06/20 10:42 AM   Specimen: BLOOD  Result Value Ref Range Status   Specimen Description BLOOD LEFT ANTECUBITAL  Final   Special Requests   Final    BOTTLES DRAWN AEROBIC ONLY Blood Culture adequate volume   Culture   Final    NO GROWTH 2 DAYS Performed at [facility], [place] [address], [place], [place] [address]    Report Status PENDING  Incomplete  Urine culture     Status: None (Preliminary result)   Collection Time: 12/06/20  1:03 PM   Specimen: In/Out Cath Urine  Result Value Ref Range Status   Specimen Description IN/OUT CATH URINE  Final   Special Requests NONE  Final   Culture   Final    CULTURE REINCUBATED FOR BETTER GROWTH Performed at [facility],  [place] [address] [place], [place] [address]    Report Status PENDING  Incomplete  MRSA PCR Screening     Status: None   Collection Time: 12/07/20  4:30 AM   Specimen: Nasal Mucosa; Nasopharyngeal  Result Value Ref Range Status   MRSA by PCR NEGATIVE NEGATIVE Final    Comment:        The GeneXpert MRSA Assay (FDA approved for NASAL specimens only), is one component of a comprehensive MRSA colonization surveillance program. It is not intended to diagnose MRSA infection nor to guide or monitor treatment for MRSA infections. Performed at [facility], [place] [address], [place], [place] [address]          Radiology Studies: DG CHEST PORT 1 VIEW  Result Date: 12/07/2020 CLINICAL DATA:  87 year old male with shortness of breath. EXAM: PORTABLE CHEST 1 VIEW COMPARISON:  Chest radiograph dated 12/06/2020. FINDINGS: Right-sided Port-A-Cath in similar position. There is background of emphysema. Patchy area of opacity in the right upper lobe similar or minimally increased compared to the prior radiograph. Left lung base atelectasis. A small left pleural effusion may be present. No pneumothorax. Stable cardiomegaly. Cardiac loop recorder device. Degenerative changes of the spine. No acute osseous pathology. IMPRESSION: 1. No significant interval change in the right upper lobe opacity. 2. Emphysema. 3. Stable cardiomegaly. Electronically Signed   By: Anner Crete M.D.   On: 12/07/2020 19:29   DG Chest Portable 1 View  Result Date: 12/06/2020 CLINICAL DATA:  Shortness of breath EXAM: PORTABLE CHEST 1 VIEW COMPARISON:  September 27, 2020. FINDINGS: Port-A-Cath tip is at the cavoatrial junction. No pneumothorax. There is airspace opacity in the inferior aspect of the right upper lobe consistent with pneumonia. The lungs elsewhere are clear. Heart size and pulmonary vascularity are normal. No adenopathy. There are foci of degenerative change in the thoracic spine with thoracolumbar levoscoliosis.  There is a loop recorder on the left. IMPRESSION: Airspace opacity consistent with pneumonia in the inferior aspect of the right upper lobe. Stable cardiac silhouette. Loop recorder on left. Stable Port-A-Cath position. Electronically Signed   By: Lowella Grip III M.D.   On: 12/06/2020 10:22   DG Swallowing Func-Speech Pathology  Result Date: 12/07/2020 Objective Swallowing  Evaluation: Type of Study: MBS-Modified Barium Swallow Study  Patient Details Name: DAYSHON ROBACK MRN: [id number] Date of Birth: Oct 09, 1929 Today's Date: 12/07/2020 Time: SLP Start Time (ACUTE ONLY): 1400 -SLP Stop Time (ACUTE ONLY): 1430 SLP Time Calculation (min) (ACUTE ONLY): 30 min Past Medical History: Past Medical History: Diagnosis Date . Anemia  . Blood transfusion without reported diagnosis  . CKD (chronic kidney disease) 07/06/2018  CKD stage III . Goals of care, counseling/discussion 02/04/2019 . History of radiation therapy 06/17/13-07/06/13  35 gray to upper lumbar spine . 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 Hypertension  . Inguinal hernia  . Multiple myeloma [facility]) Sept 2010 . Multiple myeloma in relapse ([place]) 04/05/2016 . Radiation 09/26/15-10/20/15  lower thoracic spine, upper lumbar spine 30 gray . Stroke [place])  Past Surgical History: Past Surgical History: Procedure Laterality Date . COLONOSCOPY   . HIP PINNING,CANNULATED Right 09/01/2018  Procedure: [facility] NAIL HIP PINNING;  Surgeon: Melrose Nakayama, MD;  Location: [place];  Service: Orthopedics;  Laterality: Right; . IR IMAGING GUIDED PORT INSERTION  11/01/2019 . LOOP RECORDER INSERTION N/A 04/21/2019  Procedure: LOOP RECORDER INSERTION;  Surgeon: Evans Lance, MD;  Location: [place] CV LAB;  Service: Cardiovascular;  Laterality: N/A; . TEE WITHOUT CARDIOVERSION N/A 10/09/2018  Procedure: TRANSESOPHAGEAL ECHOCARDIOGRAM (TEE);  Surgeon: Thayer Headings, MD;  Location: [facility] ENDOSCOPY;  Service: Cardiovascular;  Laterality: N/A; . TONSILLECTOMY   HPI: 87 y.o. male with  medical history significant for multiple myeloma (actively receiving chemotherapy and support of Red cell production, and has had radiation therapy for plasmacytoma) complicated by stage IIIa chronic kidney disease, chronic anemia, hypertension, CVA, BPH, dysphagia, upper GI bleeding (09/2019) who is admitted to [facility] on 12/06/2020 with severe sepsis due to right upper lobe pneumonia after presenting from home to [facility] ED complaining of shortness of breath. Esophagram (09/27/20) revealed "small-volume aspiration of thick barium and Moderate intermittent esophageal dysmotility". MBS (09/28/20) mild-moderate pharyngoesophageal dysphagia characterized by penetration into laryngeal vestibule with larger volumes of successive swallows with thin liquids, but no aspiration noted with any consistencies. Decreased UES opening and Esophageal backflow noted into cervical esophagus intermittently with pill/solids/puree. Soft/thin liquid diet recommended at that time. Failed [facility] 12/06/20.  Subjective: "I have had issues with swallowing for a while" Assessment / Plan / Recommendation CHL IP CLINICAL IMPRESSIONS 12/07/2020 Clinical Impression Pt presents with primary pharyngoesophageal dysphagia marked by trace peentration/aspiration of liquids, reduced epiglottic inversion and relaxation of UES resulting in moderate pharyngeal residue and esophageal backflow of POs into pyriform sinuses. Small cup sips of thin resulted in penetration (PAS 2) and trace aspiration (PAS 6) with consecutive sips. Trials of NTL only increased pharyngeal residuals. Pureed solids also significant for pharyngeal residuals, which improved minimally with effortful swallows, throat clear/re swallows, however minimal residuals remained in vallecua, pyriforms and at UES junction. Overall, residuals and esophageal backflow into cervical esophagus increased with thicker textures/consistencies, however msot POs did not compromise airway. Recommend pt  continue on dysphagia 2, thin liquid diet (no straw). Educated pt on taking small sips/bites at slow rate, performing effortful swallows, throat clear/re swallows and sitting upright for all meals (remaining upright for 30 minutes after as well). Recommend esophageal assessement given this component impacts pt aspiration risk. SLP Visit Diagnosis Dysphagia, pharyngoesophageal phase (R13.14) Attention and concentration deficit following -- Frontal lobe and executive function deficit following -- Impact on safety and function Mild aspiration risk   CHL IP TREATMENT RECOMMENDATION 12/07/2020 Treatment Recommendations Therapy as outlined in treatment plan  below   Prognosis 12/07/2020 Prognosis for Safe Diet Advancement Fair Barriers to Reach Goals Time post onset Barriers/Prognosis Comment -- CHL IP DIET RECOMMENDATION 12/07/2020 SLP Diet Recommendations Thin liquid;Dysphagia 2 (Fine chop) solids Liquid Administration via No straw;Cup Medication Administration Crushed with puree Compensations Slow rate;Small sips/bites;Clear throat intermittently;Effortful swallow Postural Changes Remain semi-upright after after feeds/meals (Comment);Seated upright at 90 degrees   CHL IP OTHER RECOMMENDATIONS 12/07/2020 Recommended Consults Consider esophageal assessment Oral Care Recommendations Oral care BID Other Recommendations --   CHL IP FOLLOW UP RECOMMENDATIONS 12/07/2020 Follow up Recommendations (No Data)   CHL IP FREQUENCY AND DURATION 12/07/2020 Speech Therapy Frequency (ACUTE ONLY) min 2x/week Treatment Duration 2 weeks      CHL IP ORAL PHASE 12/07/2020 Oral Phase WFL Oral - Pudding Teaspoon -- Oral - Pudding Cup -- Oral - Honey Teaspoon -- Oral - Honey Cup -- Oral - Nectar Teaspoon -- Oral - Nectar Cup -- Oral - Nectar Straw -- Oral - Thin Teaspoon -- Oral - Thin Cup -- Oral - Thin Straw -- Oral - Puree -- Oral - Mech Soft -- Oral - Regular -- Oral - Multi-Consistency -- Oral - Pill -- Oral Phase - Comment --  CHL IP PHARYNGEAL  PHASE 12/07/2020 Pharyngeal Phase Impaired Pharyngeal- Pudding Teaspoon -- Pharyngeal -- Pharyngeal- Pudding Cup -- Pharyngeal -- Pharyngeal- Honey Teaspoon -- Pharyngeal -- Pharyngeal- Honey Cup -- Pharyngeal -- Pharyngeal- Nectar Teaspoon NT Pharyngeal -- Pharyngeal- Nectar Cup Reduced epiglottic inversion;Reduced pharyngeal peristalsis;Pharyngeal residue - posterior pharnyx;Pharyngeal residue - pyriform;Pharyngeal residue - valleculae;Pharyngeal residue - cp segment Pharyngeal -- Pharyngeal- Nectar Straw NT Pharyngeal -- Pharyngeal- Thin Teaspoon NT Pharyngeal -- Pharyngeal- Thin Cup Reduced epiglottic inversion;Pharyngeal residue - posterior pharnyx;Pharyngeal residue - pyriform;Pharyngeal residue - valleculae;Penetration/Aspiration during swallow;Pharyngeal residue - cp segment Pharyngeal Material enters airway, remains ABOVE vocal cords then ejected out Pharyngeal- Thin Straw Pharyngeal residue - posterior pharnyx;Pharyngeal residue - pyriform;Pharyngeal residue - valleculae;Pharyngeal residue - cp segment Pharyngeal Material enters airway, passes BELOW cords then ejected out Pharyngeal- Puree Reduced epiglottic inversion;Pharyngeal residue - valleculae;Pharyngeal residue - pyriform;Pharyngeal residue - posterior pharnyx;Pharyngeal residue - cp segment Pharyngeal -- Pharyngeal- Mechanical Soft NT Pharyngeal -- Pharyngeal- Regular Pharyngeal residue - valleculae;Pharyngeal residue - pyriform;Pharyngeal residue - cp segment;Pharyngeal residue - posterior pharnyx;Reduced epiglottic inversion Pharyngeal -- Pharyngeal- Multi-consistency -- Pharyngeal -- Pharyngeal- Pill -- Pharyngeal -- Pharyngeal Comment --  CHL IP CERVICAL ESOPHAGEAL PHASE 12/07/2020 Cervical Esophageal Phase Impaired Pudding Teaspoon -- Pudding Cup -- Honey Teaspoon -- Honey Cup -- Nectar Teaspoon NT Nectar Cup Esophageal backflow into the pharynx;Esophageal backflow into cervical esophagus;Reduced cricopharyngeal relaxation Nectar Straw NT Thin  Teaspoon NT Thin Cup Esophageal backflow into cervical esophagus;Esophageal backflow into the pharynx;Reduced cricopharyngeal relaxation Thin Straw Esophageal backflow into the pharynx;Esophageal backflow into cervical esophagus;Reduced cricopharyngeal relaxation Puree Esophageal backflow into the pharynx;Esophageal backflow into cervical esophagus;Reduced cricopharyngeal relaxation Mechanical Soft -- Regular Reduced cricopharyngeal relaxation;Esophageal backflow into cervical esophagus;Esophageal backflow into the pharynx Multi-consistency -- Pill -- Cervical Esophageal Comment -- Ellwood Dense, MA, CCC-SLP Acute Rehabilitation Services Office Number: [phone] Acie Fredrickson 12/07/2020, 4:32 PM                   Scheduled Meds: . sodium chloride   Intravenous Once  . ciprofloxacin  2 drop Both Eyes Q4H while awake  . DULoxetine  60 mg Oral Daily  . furosemide  20 mg Intravenous Once  . furosemide  20 mg Intravenous Once  . lidocaine  1 patch Transdermal Q24H  .  metoprolol succinate  25 mg Oral Daily  . pantoprazole  20 mg Oral Daily  . predniSONE  20 mg Oral Q breakfast   Continuous Infusions: . lactated ringers 75 mL/hr at 12/07/20 2227  . magnesium sulfate bolus IVPB    . meropenem [facility]) IV      Assessment & Plan:   Principal Problem:   CAP (community acquired pneumonia) Active Problems:   Myeloma ([place])   Sepsis ([place])   AKI (acute kidney injury) ([place])   Dysphagia   SOB (shortness of breath)   Lactic acidosis   #) Severe sepsis due to right upper lobe pneumonia:  MRSA negative, vancomycin discontinued Start meropenem. F/u cx   #)PNA- worsening and requiring more 02. Will DC cefepime and start on meropenem since clinically he has deteriorated for broader coverage Give one dose of lasix as bnp very elevated Did discuss with family extensively about patient's clinical status deteriorating.  Will consult palliative care to discuss goals of  care.       #) Acute kidney injury superimposed on stage IIIa chronic kidney disease: Context of established diagnosis of stage III CKD with baseline creatinine 1.5-1.8, present creatinine noted to be elevated 2.98.  4/28-likely prerenal as he appears to be on the dry side and also on ACE inhibitor's.  Continue to hold ACE inhibitor's 4/30-renal function worse. bnp elevated. Likely may need low dose iv lasix, monitor renal function      #) Dysphagia: Patient noted to fail nursing bedside swallow screen administered in the ED  SLP consulted- placed on dysphagia 2 diet Mild asp. Risk HOB elevated Asp. Precautions  4/20-npo again as likely aspiration worsened on cxr mildly. His respiratory status got worse after eating yesterday.      #) Multiple myeloma: Follows with Dr. Marin Olp as outpatient heme-onc provider, and per patient, is schedule for next infusion via Port-A-Cath tomorrow (12/07/20).   4/30-oncology input appreciated. Plan to transfuse        #) Essential hypertension:stabel. On low-normatensive Hold ACE    #) Benign prostatic hyperplasia: On Proscar as an outpatient. holding it here.       #) Chronic anemia: Appears associated with his multiple myeloma, with baseline hemoglobin of 8-10.  Presenting hemoglobin found to be within this range.  INR nonelevated at 1.2    #6) PT/OT consult    DVT prophylaxis: scd Code Status:DNR Family Communication: spouse and grandson updated. Son went to [facility]. Per grandson DO NOT CALL spouse for updates, only the GS or son  Status is: Inpatient  Remains inpatient appropriate because:Inpatient level of care appropriate due to severity of illness   Dispo: The patient is from: Home              Anticipated d/c is to: Home              Patient currently is not medically stable to d/c.   Difficult to place patient No            LOS: 2 days   Time spent: 45 min with >50% on  coc    Nolberto Hanlon, MD [facility] Pager [phone]  If 7PM-7AM, please contact night-coverage 12/08/2020, 10:02 AM

## 2020-12-08 NOTE — Progress Notes (Signed)
OT Cancellation Note  Patient Details Name: Christian Burns MRN: [id number] DOB: 06-28-1930   Cancelled Treatment:    Reason Eval/Treat Not Completed: Medical issues which prohibited therapy (Pt receiving echo and blood transfusion, vitals continue to be unstable for therapy. Second attempt today, will try for OT eval when pt is medically stable. Pt son was present, this therapist spoke with him about the role of OT in the acute setting.)  Neria Procter A Varina Hulon 12/08/2020, 2:06 PM

## 2020-12-08 NOTE — Progress Notes (Signed)
PT Cancellation Note  Patient Details Name: DARYLL SPISAK MRN: [id number] DOB: Nov 26, 1929   Cancelled Treatment:    Reason Eval/Treat Not Completed: Medical issues which prohibited therapy Pt currently getting blood and RN requesting to hold. Will follow up as schedule allows.   Reuel Derby, PT, DPT  Acute Rehabilitation Services  Pager: [phone] Office: [phone]    Rudean Hitt 12/08/2020, 2:18 PM

## 2020-12-08 NOTE — Progress Notes (Signed)
Patient was tachycardic and fast respirations, continuation from dayshift. MEWS documentation complete. Please see progress notes.  12/07/20 2035  Assess: MEWS Score  Temp 98.1 F (36.7 C)  BP 134/74  Pulse Rate (!) 117  Resp (!) 29  Level of Consciousness Alert  SpO2 95 %  O2 Device Nasal Cannula  O2 Flow Rate (L/min) 4 L/min  Assess: MEWS Score  MEWS Temp 0  MEWS Systolic 0  MEWS Pulse 2  MEWS RR 2  MEWS LOC 0  MEWS Score 4  MEWS Score Color Red  Assess: if the MEWS score is Yellow or Red  Were vital signs taken at a resting state? Yes  Focused Assessment Change from prior assessment (see assessment flowsheet)  Early Detection of Sepsis Score *See Row Information* Medium  MEWS guidelines implemented *See Row Information* Yes  Treat  Pain Scale 0-10  Pain Score 0  Take Vital Signs  Increase Vital Sign Frequency  Red: Q 1hr X 4 then Q 4hr X 4, if remains red, continue Q 4hrs  Escalate  MEWS: Escalate Red: discuss with charge nurse/RN and provider, consider discussing with RRT  Notify: Charge Nurse/RN  Name of Charge Nurse/RN Notified Joyce, RN  Date Charge Nurse/RN Notified 12/07/20  Time Charge Nurse/RN Notified 2059  Notify: Provider  Provider Name/Title Tennis Must  Date Provider Notified 12/07/20  Time Provider Notified 2103  Notification Type Call  Notification Reason Change in status (Red MEWS)  Provider response See new orders (Night MD got update from Day MD. Olevia Bowens to put in metoprolol order for Tachycardia. Continue to monitor.)  Date of Provider Response 12/07/20  Time of Provider Response 2104  Notify: Rapid Response  Name of Rapid Response RN Notified Eulogio Ditch, RN  Date Rapid Response Notified 12/07/20  Time Rapid Response Notified 2059  Document  Patient Outcome Stabilized after interventions  Progress note created (see row info) Yes

## 2020-12-08 NOTE — Progress Notes (Signed)
Bilateral lower extremity venous duplex completed. Refer to "CV Proc" under chart review to view preliminary results.  12/08/2020 11:25 AM Kelby Aline., MHA, RVT, RDCS, RDMS

## 2020-12-08 NOTE — Progress Notes (Signed)
OT Cancellation Note  Patient Details Name: Christian Burns MRN: [id number] DOB: [birth year]   Cancelled Treatment:    Reason Eval/Treat Not Completed: Medical issues which prohibited therapy (pt's vitals not stable for therapy, per RN pt with difficulty breathing and having multiple dx tests completed this morning. Will attempt eval this afternoon as appropriate.)    Jelisa [place] A Edahi Kroening 12/08/2020, 11:25 AM

## 2020-12-09 LAB — CBC WITH DIFFERENTIAL/PLATELET
Abs Immature Granulocytes: 1.11 10*3/uL — ABNORMAL HIGH (ref 0.00–0.07)
Basophils Absolute: 0.2 10*3/uL — ABNORMAL HIGH (ref 0.0–0.1)
Basophils Relative: 2 %
Eosinophils Absolute: 0 10*3/uL (ref 0.0–0.5)
Eosinophils Relative: 0 %
HCT: 33.1 % — ABNORMAL LOW (ref 39.0–52.0)
Hemoglobin: 11.2 g/dL — ABNORMAL LOW (ref 13.0–17.0)
Immature Granulocytes: 12 %
Lymphocytes Relative: 1 %
Lymphs Abs: 0.1 10*3/uL — ABNORMAL LOW (ref 0.7–4.0)
MCH: 30.1 pg (ref 26.0–34.0)
MCHC: 33.8 g/dL (ref 30.0–36.0)
MCV: 89 fL (ref 80.0–100.0)
Monocytes Absolute: 4.4 10*3/uL — ABNORMAL HIGH (ref 0.1–1.0)
Monocytes Relative: 50 %
Neutro Abs: 3.2 10*3/uL (ref 1.7–7.7)
Neutrophils Relative %: 35 %
Platelets: 62 10*3/uL — ABNORMAL LOW (ref 150–400)
RBC: 3.72 MIL/uL — ABNORMAL LOW (ref 4.22–5.81)
RDW: 17.5 % — ABNORMAL HIGH (ref 11.5–15.5)
WBC: 8.9 10*3/uL (ref 4.0–10.5)
nRBC: 0.2 % (ref 0.0–0.2)

## 2020-12-09 LAB — BPAM RBC
Blood Product Expiration Date: 202205222359
Blood Product Expiration Date: 202205242359
ISSUE DATE / TIME: 202204291353
ISSUE DATE / TIME: 202204291736
Unit Type and Rh: 5100
Unit Type and Rh: 5100

## 2020-12-09 LAB — COMPREHENSIVE METABOLIC PANEL
ALT: 16 U/L (ref 0–44)
AST: 33 U/L (ref 15–41)
Albumin: 1.6 g/dL — ABNORMAL LOW (ref 3.5–5.0)
Alkaline Phosphatase: 77 U/L (ref 38–126)
Anion gap: 18 — ABNORMAL HIGH (ref 5–15)
BUN: 73 mg/dL — ABNORMAL HIGH (ref 8–23)
CO2: 19 mmol/L — ABNORMAL LOW (ref 22–32)
Calcium: 8 mg/dL — ABNORMAL LOW (ref 8.9–10.3)
Chloride: 110 mmol/L (ref 98–111)
Creatinine, Ser: 3.2 mg/dL — ABNORMAL HIGH (ref 0.61–1.24)
GFR, Estimated: 18 mL/min — ABNORMAL LOW (ref >60)
Glucose, Bld: 110 mg/dL — ABNORMAL HIGH (ref 70–99)
Potassium: 4.3 mmol/L (ref 3.5–5.1)
Sodium: 147 mmol/L — ABNORMAL HIGH (ref 135–145)
Total Bilirubin: 1 mg/dL (ref 0.3–1.2)
Total Protein: 4.8 g/dL — ABNORMAL LOW (ref 6.5–8.1)

## 2020-12-09 LAB — TYPE AND SCREEN
ABO/RH(D): O POS
Antibody Screen: NEGATIVE
Unit division: 0
Unit division: 0

## 2020-12-09 MED ORDER — MORPHINE SULFATE (PF) 2 MG/ML IV SOLN
0.5000 mg | Freq: Once | INTRAVENOUS | Status: AC
  Administered 2020-12-09: 0.5 mg via INTRAVENOUS

## 2020-12-09 MED ORDER — ACETAMINOPHEN 325 MG PO TABS: 650.0000 mg | ORAL_TABLET | Freq: Four times a day (QID) | ORAL | Status: AC | PRN

## 2020-12-09 MED ORDER — MORPHINE SULFATE (PF) 2 MG/ML IV SOLN: 0.5000 mg | INTRAVENOUS | Status: DC | PRN

## 2020-12-09 MED ORDER — LIDOCAINE 5 % EX PTCH: 1.0000 | MEDICATED_PATCH | CUTANEOUS | 0 refills | Status: AC

## 2020-12-09 MED ORDER — MORPHINE SULFATE (PF) 2 MG/ML IV SOLN
INTRAVENOUS | Status: AC
  Filled 2020-12-09: qty 1

## 2020-12-09 NOTE — Progress Notes (Signed)
   12/09/20 0841  Assess: MEWS Score  Temp 99.1 F (37.3 C)  BP (!) 142/115  Pulse Rate (!) 121  Resp (!) 44  Level of Consciousness Alert  SpO2 92 %  O2 Device Simple Mask  O2 Flow Rate (L/min) 5 L/min  Assess: MEWS Score  MEWS Temp 0  MEWS Systolic 0  MEWS Pulse 2  MEWS RR 3  MEWS LOC 0  MEWS Score 5  MEWS Score Color Red  Assess: if the MEWS score is Yellow or Red  Were vital signs taken at a resting state? Yes  Focused Assessment Change from prior assessment (see assessment flowsheet)  Early Detection of Sepsis Score *See Row Information* Low  MEWS guidelines implemented *See Row Information* Yes  Treat  MEWS Interventions Escalated (See documentation below);Administered prn meds/treatments;Administered scheduled meds/treatments  Pain Scale 0-10  Pain Score 6  Pain Type Chronic pain  Pain Location Neck  Pain Orientation Posterior  Pain Descriptors / Indicators Aching  Pain Frequency Intermittent  Pain Onset With Activity  Patients Stated Pain Goal 3  Pain Intervention(s) Medication (See eMAR);MD notified (Comment)  Multiple Pain Sites No  Take Vital Signs  Increase Vital Sign Frequency  Yellow: Q 2hr X 2 then Q 4hr X 2, if remains yellow, continue Q 4hrs;Red: Q 1hr X 4 then Q 4hr X 4, if remains red, continue Q 4hrs  Escalate  MEWS: Escalate Red: discuss with charge nurse/RN and provider, consider discussing with RRT  Notify: Charge Nurse/RN  Name of Charge Nurse/RN Notified [place], RN  Date Charge Nurse/RN Notified 12/09/20  Time Charge Nurse/RN Notified 4098  Notify: Provider  Provider Name/Title Nolberto Hanlon MD  Date Provider Notified 12/09/20  Time Provider Notified [phone]  Notification Type Page  Notification Reason Change in status  Provider response En route  Date of Provider Response 12/09/20  Time of Provider Response 0855  Notify: Rapid Response  Name of Rapid Response RN Notified Horris Latino, RN  Date Rapid Response Notified 12/09/20  Time Rapid  Response Notified 0902  Document  Patient Outcome Stabilized after interventions  Progress note created (see row info) Yes

## 2020-12-09 NOTE — Plan of Care (Signed)

## 2020-12-09 NOTE — Significant Event (Signed)
Rapid Response Event Note   Reason for Call :  Respiratory distress, increased O2 needs  Initial Focused Assessment:  Patient is alert in moderate respiratory distress.  Lung sounds clear, heart tones regular  BP 120/86  HR 116  RR 44 O2 sat 98% on NRB Temp 101.4 rectal   Interventions:  Assisted to sitting on the side of the bed.  No change in respiratory status. Repositioned in the bed with head of bed elevated.   Encourage purse lip breathing,  Limited ability to purse lip breath.  Dr Kurtis Bushman at bedside to assess patient.   0.5 mg Morphine given IV for respiratory distress per MD order.   Plan of Care:     Event Summary:   MD Notified: Dr Kurtis Bushman Call Time: 0901 Arrival Time: 0905 End Time: 0945  Raliegh Ip, RN

## 2020-12-09 NOTE — Progress Notes (Signed)
   12/09/20 1829  AVS Discharge Documentation  AVS Discharge Instructions Including Medications Placed in discharge packet for receiving facility  Name of Person Receiving AVS Discharge Instructions Including Medications PTAR  Name of Clinician That Reviewed AVS Discharge Instructions Including Medications Gregary Signs, RN    Report called to receiving facility and given to [facility]. Patient discharged to [facility] with all personal belongings and left unit via PTAR, no issues.

## 2020-12-09 NOTE — Progress Notes (Signed)
SLP Cancellation Note  Patient Details Name: Christian Burns MRN: [id number] DOB: 04-01-1930   Cancelled treatment:       Reason Eval/Treat Not Completed: Medical issues which prohibited therapy  Per RN they were in the middle of a rapid response due to patient status.  ST will follow up to assess for need for continued ST intervention.   Shelly Flatten, MA, [facility] SLP [phone]  Christian Burns 12/09/2020, 9:34 AM

## 2020-12-09 NOTE — TOC Transition Note (Addendum)
Transition of Care [facility]) - CM/SW Discharge Note   Patient Details  Name: Christian Burns MRN: [id number] Date of Birth: May 04, 1930  Transition of Care [facility]) CM/SW Contact:  Pollie Friar, RN Phone Number: 12/09/2020, 3:46 PM   Clinical Narrative:    Christian Burns has accepted the patient and has a bed today. Patient will transport via [place]. Son is aware and in agreement with the plan. Bedside Rn updated and d/c packet completed.   Number for report: [phone]   Final next level of care: [place] Barriers to Discharge: No Barriers Identified   Patient Goals and CMS Choice        Discharge Placement                       Discharge Plan and Services                                     Social Determinants of Health (SDOH) Interventions     Readmission Risk Interventions Readmission Risk Prevention Plan 10/03/2019  Transportation Screening Complete  HRI or Home Care Consult Complete  SW Recovery Care/Counseling Consult Complete  Skilled Nursing Facility Complete  Some recent data might be hidden

## 2020-12-09 NOTE — Progress Notes (Addendum)
Manufacturing engineer [facility]) Hospital Liaison note.    Addendum:  Pt approved fr eligibility.  Family agrees for patient to transfer today.  Consents complete at this time.  Received request from [place] for family interest in [facility].  Chart and pt information under review at this time by [facility] physician for [facility] eligibility.    Thank you for the opportunity to participate in this patient's care.  Domenic Moras, BSN, RN [facility] Liaison (listed on [place] under [facility]osp[facility])    [phone] (24h on call)

## 2020-12-09 NOTE — Discharge Summary (Signed)
JAESHAWN SILVIO ZRA:[id number] DOB: Feb 11, 1930 DOA: 12/06/2020  PCP: Leanna Battles, MD  Admit date: 12/06/2020 Discharge date: 12/09/2020  Admitted From: HOME Disposition:  [place]   Discharge Condition:Stable CODE STATUS:DNR  Diet recommendation: dysphagia 2  Brief summary: [name] a 87 y.o.malewith medical history significant formultiple myeloma complicated by stage IIIa chronic kidney disease with baseline creatinine 1.5-1.8, chronic anemia with baseline hemoglobin 8-10, hypertension, BPH,who is admitted to [facility] on 4/27/2022with severe sepsis due to right upper lobe pneumoniaafter presenting from home to [facility] ED complaining of shortness of breath. Found with aspiration pnemonia which appeared to be progressing. BNP elevated was given IV lasix prn but with worsening renal function. Eventually due to patient aspiration risk, not clinically improving, after discussion with family, they were interested in hospice house.   #) Severe sepsis due to right upper lobe pneumonia: MRSA negative, vancomycin discontinued Was switched to  meropenem , with not much improvement. Blood cultures negative to date Urine culture with 3000 Proteus Mirabella's and staph epi   #)PNA- worsening and requiring more 02/FM Cefepime was discontinued and was started on meropenem on 4/29 for broader coverage  Lasix was given due to elevated BNP however not responding  Still requiring more oxygen and now on facemask  Px poor      #) Acute kidney injury superimposed on stage IIIa chronic kidney disease: renal function worsening, multifactorial   #) Dysphagia:Patient noted to fail nursing bedside swallow screen administered in the ED  SLP consulted- placed on dysphagia 2 diet Mild asp. Risk HOB elevated Asp. Precautions        #) Multiple myeloma: Follows withDr. Marin Olp as outpatient heme-onc provider, and per patient, is schedulefor next infusion via  Port-A-Cath tomorrow(12/07/20). oncology input appreciated. Planned to transfuse    #) Essential hypertension: Hold ACE on beta blk  #) Benign prostatic hyperplasia: On Proscar as an outpatient.      #) Chronic anemia:Appears associated with his multiple myeloma, with baseline hemoglobin of 8-10.        Discharge Diagnoses:  Principal Problem:   CAP (community acquired pneumonia) Active Problems:   Myeloma ([place])   Sepsis ([place])   AKI (acute kidney injury) ([place])   Dysphagia   SOB (shortness of breath)   Lactic acidosis    Discharge Instructions  Discharge Instructions    Diet - low sodium heart healthy   Complete by: As directed    Increase activity slowly   Complete by: As directed      Allergies as of 12/09/2020      Reactions   Tape Other (See Comments)   Skin is VERY THIN- TEARS and BRUISES VERY EASILY!!   Sulfamethoxazole-trimethoprim Itching   Sulfa Antibiotics Itching   Sulfasalazine Itching      Medication List    STOP taking these medications   amLODipine-benazepril 5-10 MG capsule Commonly known as: LOTREL   aspirin EC 81 MG tablet   dronabinol 5 MG capsule Commonly known as: MARINOL   DULoxetine 60 MG capsule Commonly known as: CYMBALTA   loperamide 2 MG tablet Commonly known as: IMODIUM A-D   penicillin v potassium 500 MG tablet Commonly known as: VEETID   pyridOXINE 100 MG tablet Commonly known as: VITAMIN B-6   vitamin B-12 500 MCG tablet Commonly known as: CYANOCOBALAMIN   Vitamin D (Ergocalciferol) 1.25 MG (50000 UNIT) Caps capsule Commonly known as: DRISDOL     TAKE these medications   acetaminophen 325 MG tablet Commonly known as:  TYLENOL Take 2 tablets (650 mg total) by mouth every 6 (six) hours as needed for mild pain (or Fever >/= 101).   finasteride 5 MG tablet Commonly known as: PROSCAR TAKE ONE TABLET EACH DAY What changed: See the new instructions.   lidocaine 5 % Commonly known as:  LIDODERM Place 1 patch onto the skin daily. Remove & Discard patch within 12 hours or as directed by MD Start taking on: Dec 10, 2020   metoprolol succinate 25 MG 24 hr tablet Commonly known as: TOPROL-XL Take 25 mg by mouth daily.   polyvinyl alcohol 1.4 % ophthalmic solution Commonly known as: LIQUIFILM TEARS Place 1 drop into both eyes as needed for dry eyes.   predniSONE 20 MG tablet Commonly known as: DELTASONE TAKE ONE TABLET EACH DAY WITH BREAKFAST What changed: See the new instructions.   PROBIOTIC PO Take 1 capsule by mouth daily.       Allergies  Allergen Reactions  . Tape Other (See Comments)    Skin is VERY THIN- TEARS and BRUISES VERY EASILY!!  . Sulfamethoxazole-Trimethoprim Itching  . Sulfa Antibiotics Itching  . Sulfasalazine Itching    Consultations:     Procedures/Studies: DG CHEST PORT 1 VIEW  Result Date: 12/07/2020 CLINICAL DATA:  87 year old male with shortness of breath. EXAM: PORTABLE CHEST 1 VIEW COMPARISON:  Chest radiograph dated 12/06/2020. FINDINGS: Right-sided Port-A-Cath in similar position. There is background of emphysema. Patchy area of opacity in the right upper lobe similar or minimally increased compared to the prior radiograph. Left lung base atelectasis. A small left pleural effusion may be present. No pneumothorax. Stable cardiomegaly. Cardiac loop recorder device. Degenerative changes of the spine. No acute osseous pathology. IMPRESSION: 1. No significant interval change in the right upper lobe opacity. 2. Emphysema. 3. Stable cardiomegaly. Electronically Signed   By: Anner Crete M.D.   On: 12/07/2020 19:29   DG Chest Portable 1 View  Result Date: 12/06/2020 CLINICAL DATA:  Shortness of breath EXAM: PORTABLE CHEST 1 VIEW COMPARISON:  September 27, 2020. FINDINGS: Port-A-Cath tip is at the cavoatrial junction. No pneumothorax. There is airspace opacity in the inferior aspect of the right upper lobe consistent with pneumonia. The  lungs elsewhere are clear. Heart size and pulmonary vascularity are normal. No adenopathy. There are foci of degenerative change in the thoracic spine with thoracolumbar levoscoliosis. There is a loop recorder on the left. IMPRESSION: Airspace opacity consistent with pneumonia in the inferior aspect of the right upper lobe. Stable cardiac silhouette. Loop recorder on left. Stable Port-A-Cath position. Electronically Signed   By: Lowella Grip III M.D.   On: 12/06/2020 10:22   DG ABD ACUTE 2+V W 1V CHEST  Result Date: 12/02/2020 CLINICAL DATA:  Pain, lower abdominal tenderness. EXAM: DG ABDOMEN ACUTE WITH 1 VIEW CHEST COMPARISON:  Chest radiograph dated 09/27/2020. CT abdomen pelvis dated 06/08/2013. FINDINGS: There is no evidence of dilated bowel loops or free intraperitoneal air. No radiopaque calculi or other significant radiographic abnormality is seen. Heart size and mediastinal contours are within normal limits. Both lungs are clear. A right internal jugular central venous port catheter tip overlies the superior cavoatrial junction. A cardiac loop recorder overlies the left chest. Vertebroplasty changes are seen in at L2. Degenerative changes are seen in the spine and right greater than left hips. The patient is status fixation of the proximal right femur, partially imaged. IMPRESSION: Nonobstructive bowel gas pattern.  No acute cardiopulmonary disease. Electronically Signed   By: Harley Hallmark.D.  On: 12/02/2020 12:53   DG Swallowing Func-Speech Pathology  Result Date: 12/07/2020 Objective Swallowing Evaluation: Type of Study: MBS-Modified Barium Swallow Study  Patient Details Name: MANN SKAGGS MRN: [id number] Date of Birth: August 14, 1929 Today's Date: 12/07/2020 Time: SLP Start Time (ACUTE ONLY): 1400 -SLP Stop Time (ACUTE ONLY): 1430 SLP Time Calculation (min) (ACUTE ONLY): 30 min Past Medical History: Past Medical History: Diagnosis Date . Anemia  . Blood transfusion without reported diagnosis  .  CKD (chronic kidney disease) 07/06/2018  CKD stage III . Goals of care, counseling/discussion 02/04/2019 . History of radiation therapy 06/17/13-07/06/13  35 gray to upper lumbar spine . 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 Hypertension  . Inguinal hernia  . Multiple myeloma [facility]) Sept 2010 . Multiple myeloma in relapse ([place]) 04/05/2016 . Radiation 09/26/15-10/20/15  lower thoracic spine, upper lumbar spine 30 gray . Stroke [facility])  Past Surgical History: Past Surgical History: Procedure Laterality Date . COLONOSCOPY   . HIP PINNING,CANNULATED Right 09/01/2018  Procedure: [facility] NAIL HIP PINNING;  Surgeon: Melrose Nakayama, MD;  Location: [facility];  Service: Orthopedics;  Laterality: Right; . IR IMAGING GUIDED PORT INSERTION  11/01/2019 . LOOP RECORDER INSERTION N/A 04/21/2019  Procedure: LOOP RECORDER INSERTION;  Surgeon: Evans Lance, MD;  Location: [place] CV LAB;  Service: Cardiovascular;  Laterality: N/A; . TEE WITHOUT CARDIOVERSION N/A 10/09/2018  Procedure: TRANSESOPHAGEAL ECHOCARDIOGRAM (TEE);  Surgeon: Thayer Headings, MD;  Location: [facility] ENDOSCOPY;  Service: Cardiovascular;  Laterality: N/A; . TONSILLECTOMY   HPI: 87 y.o. male with medical history significant for multiple myeloma (actively receiving chemotherapy and support of Red cell production, and has had radiation therapy for plasmacytoma) complicated by stage IIIa chronic kidney disease, chronic anemia, hypertension, CVA, BPH, dysphagia, upper GI bleeding (09/2019) who is admitted to [facility] on 12/06/2020 with severe sepsis due to right upper lobe pneumonia after presenting from home to [facility] ED complaining of shortness of breath. Esophagram (09/27/20) revealed "small-volume aspiration of thick barium and Moderate intermittent esophageal dysmotility". MBS (09/28/20) mild-moderate pharyngoesophageal dysphagia characterized by penetration into laryngeal vestibule with larger volumes of successive swallows with thin liquids, but no aspiration noted  with any consistencies. Decreased UES opening and Esophageal backflow noted into cervical esophagus intermittently with pill/solids/puree. Soft/thin liquid diet recommended at that time. Failed [facility] 12/06/20.  Subjective: "I have had issues with swallowing for a while" Assessment / Plan / Recommendation CHL IP CLINICAL IMPRESSIONS 12/07/2020 Clinical Impression Pt presents with primary pharyngoesophageal dysphagia marked by trace peentration/aspiration of liquids, reduced epiglottic inversion and relaxation of UES resulting in moderate pharyngeal residue and esophageal backflow of POs into pyriform sinuses. Small cup sips of thin resulted in penetration (PAS 2) and trace aspiration (PAS 6) with consecutive sips. Trials of NTL only increased pharyngeal residuals. Pureed solids also significant for pharyngeal residuals, which improved minimally with effortful swallows, throat clear/re swallows, however minimal residuals remained in vallecua, pyriforms and at UES junction. Overall, residuals and esophageal backflow into cervical esophagus increased with thicker textures/consistencies, however msot POs did not compromise airway. Recommend pt continue on dysphagia 2, thin liquid diet (no straw). Educated pt on taking small sips/bites at slow rate, performing effortful swallows, throat clear/re swallows and sitting upright for all meals (remaining upright for 30 minutes after as well). Recommend esophageal assessement given this component impacts pt aspiration risk. SLP Visit Diagnosis Dysphagia, pharyngoesophageal phase (R13.14) Attention and concentration deficit following -- Frontal lobe and executive function deficit following -- Impact on safety and function Mild aspiration risk  CHL IP TREATMENT RECOMMENDATION 12/07/2020 Treatment Recommendations Therapy as outlined in treatment plan below   Prognosis 12/07/2020 Prognosis for Safe Diet Advancement Fair Barriers to Reach Goals Time post onset Barriers/Prognosis Comment  -- CHL IP DIET RECOMMENDATION 12/07/2020 SLP Diet Recommendations Thin liquid;Dysphagia 2 (Fine chop) solids Liquid Administration via No straw;Cup Medication Administration Crushed with puree Compensations Slow rate;Small sips/bites;Clear throat intermittently;Effortful swallow Postural Changes Remain semi-upright after after feeds/meals (Comment);Seated upright at 90 degrees   CHL IP OTHER RECOMMENDATIONS 12/07/2020 Recommended Consults Consider esophageal assessment Oral Care Recommendations Oral care BID Other Recommendations --   CHL IP FOLLOW UP RECOMMENDATIONS 12/07/2020 Follow up Recommendations (No Data)   CHL IP FREQUENCY AND DURATION 12/07/2020 Speech Therapy Frequency (ACUTE ONLY) min 2x/week Treatment Duration 2 weeks      CHL IP ORAL PHASE 12/07/2020 Oral Phase WFL Oral - Pudding Teaspoon -- Oral - Pudding Cup -- Oral - Honey Teaspoon -- Oral - Honey Cup -- Oral - Nectar Teaspoon -- Oral - Nectar Cup -- Oral - Nectar Straw -- Oral - Thin Teaspoon -- Oral - Thin Cup -- Oral - Thin Straw -- Oral - Puree -- Oral - Mech Soft -- Oral - Regular -- Oral - Multi-Consistency -- Oral - Pill -- Oral Phase - Comment --  CHL IP PHARYNGEAL PHASE 12/07/2020 Pharyngeal Phase Impaired Pharyngeal- Pudding Teaspoon -- Pharyngeal -- Pharyngeal- Pudding Cup -- Pharyngeal -- Pharyngeal- Honey Teaspoon -- Pharyngeal -- Pharyngeal- Honey Cup -- Pharyngeal -- Pharyngeal- Nectar Teaspoon NT Pharyngeal -- Pharyngeal- Nectar Cup Reduced epiglottic inversion;Reduced pharyngeal peristalsis;Pharyngeal residue - posterior pharnyx;Pharyngeal residue - pyriform;Pharyngeal residue - valleculae;Pharyngeal residue - cp segment Pharyngeal -- Pharyngeal- Nectar Straw NT Pharyngeal -- Pharyngeal- Thin Teaspoon NT Pharyngeal -- Pharyngeal- Thin Cup Reduced epiglottic inversion;Pharyngeal residue - posterior pharnyx;Pharyngeal residue - pyriform;Pharyngeal residue - valleculae;Penetration/Aspiration during swallow;Pharyngeal residue - cp segment  Pharyngeal Material enters airway, remains ABOVE vocal cords then ejected out Pharyngeal- Thin Straw Pharyngeal residue - posterior pharnyx;Pharyngeal residue - pyriform;Pharyngeal residue - valleculae;Pharyngeal residue - cp segment Pharyngeal Material enters airway, passes BELOW cords then ejected out Pharyngeal- Puree Reduced epiglottic inversion;Pharyngeal residue - valleculae;Pharyngeal residue - pyriform;Pharyngeal residue - posterior pharnyx;Pharyngeal residue - cp segment Pharyngeal -- Pharyngeal- Mechanical Soft NT Pharyngeal -- Pharyngeal- Regular Pharyngeal residue - valleculae;Pharyngeal residue - pyriform;Pharyngeal residue - cp segment;Pharyngeal residue - posterior pharnyx;Reduced epiglottic inversion Pharyngeal -- Pharyngeal- Multi-consistency -- Pharyngeal -- Pharyngeal- Pill -- Pharyngeal -- Pharyngeal Comment --  CHL IP CERVICAL ESOPHAGEAL PHASE 12/07/2020 Cervical Esophageal Phase Impaired Pudding Teaspoon -- Pudding Cup -- Honey Teaspoon -- Honey Cup -- Nectar Teaspoon NT Nectar Cup Esophageal backflow into the pharynx;Esophageal backflow into cervical esophagus;Reduced cricopharyngeal relaxation Nectar Straw NT Thin Teaspoon NT Thin Cup Esophageal backflow into cervical esophagus;Esophageal backflow into the pharynx;Reduced cricopharyngeal relaxation Thin Straw Esophageal backflow into the pharynx;Esophageal backflow into cervical esophagus;Reduced cricopharyngeal relaxation Puree Esophageal backflow into the pharynx;Esophageal backflow into cervical esophagus;Reduced cricopharyngeal relaxation Mechanical Soft -- Regular Reduced cricopharyngeal relaxation;Esophageal backflow into cervical esophagus;Esophageal backflow into the pharynx Multi-consistency -- Pill -- Cervical Esophageal Comment -- Ellwood Dense, MA, [place] Office Number: [phone] Acie Fredrickson 12/07/2020, 4:32 PM              ECHOCARDIOGRAM COMPLETE  Result Date: 12/08/2020     ECHOCARDIOGRAM REPORT   Patient Name:   ORVEL CUTSFORTH [facility] Date of Exam: 12/08/2020 Medical Rec #:  [id number]     Height:       70.0 in Accession #:  [id number]    Weight:       120.0 lb Date of Birth:  08/08/1930     BSA:          1.680 m Patient Age:    84 years      BP:           150/77 mmHg Patient Gender: M             HR:           93 bpm. Exam Location:  Inpatient Procedure: 2D Echo, 3D Echo, Cardiac Doppler, Color Doppler and Strain Analysis Indications:    R07.9* Chest pain, unspecified. Elevated troponin.  History:        Patient has prior history of Echocardiogram examinations, most                 recent 04/20/2019. Signs/Symptoms:Shortness of Breath and                 Bacteremia; Risk Factors:Hypertension. Multiple myeloma.                 Chemotherapy.  Sonographer:    Roseanna Rainbow RDCS Referring Phys: [id number] [facility]  Sonographer Comments: Technically difficult study due to poor echo windows. Global longitudinal strain was attempted. IMPRESSIONS  1. Mild inferior hypokinesis. Left ventricular ejection fraction, by estimation, is 50 to 55%. The left ventricle has low normal function. The left ventricle demonstrates regional wall motion abnormalities (see scoring diagram/findings for description).  There is moderate concentric left ventricular hypertrophy. Left ventricular diastolic parameters are consistent with Grade II diastolic dysfunction (pseudonormalization).  2. Right ventricular systolic function is normal. The right ventricular size is normal. There is normal pulmonary artery systolic pressure.  3. Left atrial size was severely dilated.  4. The mitral valve is normal in structure. Mild mitral valve regurgitation. No evidence of mitral stenosis.  5. The aortic valve is normal in structure. There is moderate calcification of the aortic valve. There is moderate thickening of the aortic valve. Aortic valve regurgitation is not visualized. No aortic stenosis is present.  6. The inferior vena cava is  normal in size with greater than 50% respiratory variability, suggesting right atrial pressure of 3 mmHg. FINDINGS  Left Ventricle: Mild inferior hypokinesis. Left ventricular ejection fraction, by estimation, is 50 to 55%. The left ventricle has low normal function. The left ventricle demonstrates regional wall motion abnormalities. Global longitudinal strain performed but not reported based on interpreter judgement due to suboptimal tracking. The left ventricular internal cavity size was normal in size. There is moderate concentric left ventricular hypertrophy. Left ventricular diastolic parameters are consistent with Grade II diastolic dysfunction (pseudonormalization). Indeterminate filling pressures. Right Ventricle: The right ventricular size is normal. No increase in right ventricular wall thickness. Right ventricular systolic function is normal. There is normal pulmonary artery systolic pressure. The tricuspid regurgitant velocity is 2.76 m/s, and  with an assumed right atrial pressure of 3 mmHg, the estimated right ventricular systolic pressure is 84.6 mmHg. Left Atrium: Left atrial size was severely dilated. Right Atrium: Right atrial size was normal in size. Pericardium: There is no evidence of pericardial effusion. Mitral Valve: The mitral valve is normal in structure. Mild mitral valve regurgitation. No evidence of mitral valve stenosis. Tricuspid Valve: The tricuspid valve is normal in structure. Tricuspid valve regurgitation is mild . No evidence of tricuspid stenosis. Aortic Valve: The aortic valve is normal in structure. There is moderate calcification of the aortic valve. There is moderate  thickening of the aortic valve. Aortic valve regurgitation is not visualized. No aortic stenosis is present. Pulmonic Valve: The pulmonic valve was normal in structure. Pulmonic valve regurgitation is not visualized. No evidence of pulmonic stenosis. Aorta: The aortic root is normal in size and structure. Venous:  The inferior vena cava is normal in size with greater than 50% respiratory variability, suggesting right atrial pressure of 3 mmHg. IAS/Shunts: No atrial level shunt detected by color flow Doppler.  LEFT VENTRICLE PLAX 2D LVIDd:         3.50 cm     Diastology LVIDs:         2.60 cm     LV e' medial:    5.55 cm/s LV PW:         1.40 cm     LV E/e' medial:  14.2 LV IVS:        1.50 cm     LV e' lateral:   7.18 cm/s LVOT diam:     2.30 cm     LV E/e' lateral: 11.0 LV SV:         95 LV SV Index:   56 LVOT Area:     4.15 cm  LV Volumes (MOD) LV vol d, MOD A2C: 57.0 ml LV vol d, MOD A4C: 90.2 ml LV vol s, MOD A2C: 31.8 ml LV vol s, MOD A4C: 35.5 ml LV SV MOD A2C:     25.2 ml LV SV MOD A4C:     90.2 ml LV SV MOD BP:      40.0 ml RIGHT VENTRICLE             IVC RV S prime:     10.00 cm/s  IVC diam: 1.80 cm TAPSE (M-mode): 1.2 cm LEFT ATRIUM           Index       RIGHT ATRIUM           Index LA diam:      3.40 cm 2.02 cm/m  RA Area:     15.90 cm LA Vol (A2C): 18.5 ml 11.01 ml/m RA Volume:   48.80 ml  29.05 ml/m LA Vol (A4C): 77.8 ml 46.31 ml/m  AORTIC VALVE LVOT Vmax:   146.00 cm/s LVOT Vmean:  114.000 cm/s LVOT VTI:    0.228 m  AORTA Ao Root diam: 3.10 cm Ao Asc diam:  2.80 cm MITRAL VALVE               TRICUSPID VALVE MV Area (PHT): 3.10 cm    TR Peak grad:   30.5 mmHg MV Decel Time: 245 msec    TR Vmax:        276.00 cm/s MV E velocity: 78.65 cm/s MV A velocity: 61.25 cm/s  SHUNTS MV E/A ratio:  1.28        Systemic VTI:  0.23 m                            Systemic Diam: 2.30 cm Skeet Latch MD Electronically signed by Skeet Latch MD Signature Date/Time: 12/08/2020/4:09:39 PM    Final    CUP PACEART REMOTE DEVICE CHECK  Result Date: 11/18/2020 ILR summary report received. Battery status OK. Normal device function. No new symptom, tachy, brady, or pause episodes. No new AF episodes. Monthly summary reports and ROV/PRN Kathy Breach, RN, CCDS, [employer]  VAS [place] LOWER EXTREMITY VENOUS  (DVT)  Result Date: 12/08/2020  Lower Venous DVT Study Patient Name:  JEREME LOREN [facility]  Date of Exam:   12/08/2020 Medical Rec #: [id number]      Accession #:    [id number] Date of Birth: 02/04/1930      Patient Gender: M Patient Age:   89Y Exam Location:  [facility] Procedure:      VAS [place] LOWER EXTREMITY VENOUS (DVT) Referring Phys: [id number] [facility] [place] --------------------------------------------------------------------------------  Indications: Swelling.  Comparison Study: No prior study Performing Technologist: Maudry Mayhew MHA, RDMS, RVT, RDCS  Examination Guidelines: A complete evaluation includes B-mode imaging, spectral Doppler, color Doppler, and power Doppler as needed of all accessible portions of each vessel. Bilateral testing is considered an integral part of a complete examination. Limited examinations for reoccurring indications may be performed as noted. The reflux portion of the exam is performed with the patient in reverse Trendelenburg.  +---------+---------------+---------+-----------+----------+--------------+ RIGHT    CompressibilityPhasicitySpontaneityPropertiesThrombus Aging +---------+---------------+---------+-----------+----------+--------------+ CFV      Full           Yes      Yes                                 +---------+---------------+---------+-----------+----------+--------------+ SFJ      Full                                                        +---------+---------------+---------+-----------+----------+--------------+ FV Prox  Full                                                        +---------+---------------+---------+-----------+----------+--------------+ FV Mid   Full                                                        +---------+---------------+---------+-----------+----------+--------------+ FV DistalFull                                                         +---------+---------------+---------+-----------+----------+--------------+ PFV      Full                                                        +---------+---------------+---------+-----------+----------+--------------+ POP      Full           Yes      Yes                                 +---------+---------------+---------+-----------+----------+--------------+ PTV      Full                                                        +---------+---------------+---------+-----------+----------+--------------+  PERO     Full                                                        +---------+---------------+---------+-----------+----------+--------------+   +---------+---------------+---------+-----------+----------+--------------+ LEFT     CompressibilityPhasicitySpontaneityPropertiesThrombus Aging +---------+---------------+---------+-----------+----------+--------------+ CFV      Full           Yes      Yes                                 +---------+---------------+---------+-----------+----------+--------------+ SFJ      Full                                                        +---------+---------------+---------+-----------+----------+--------------+ FV Prox  Full                                                        +---------+---------------+---------+-----------+----------+--------------+ FV Mid   Full                                                        +---------+---------------+---------+-----------+----------+--------------+ FV DistalFull                                                        +---------+---------------+---------+-----------+----------+--------------+ PFV      Full                                                        +---------+---------------+---------+-----------+----------+--------------+ POP      Full           Yes      Yes                                  +---------+---------------+---------+-----------+----------+--------------+ PTV      Full                                                        +---------+---------------+---------+-----------+----------+--------------+ PERO     Full                                                        +---------+---------------+---------+-----------+----------+--------------+  Summary: RIGHT: - There is no evidence of deep vein thrombosis in the lower extremity.  - No cystic structure found in the popliteal fossa.  LEFT: - There is no evidence of deep vein thrombosis in the lower extremity.  - No cystic structure found in the popliteal fossa.  *See table(s) above for measurements and observations.    Preliminary        Subjective: On FM when I saw pt.    Discharge Exam: Vitals:   12/09/20 0924 12/09/20 0940  BP:  135/72  Pulse:  (!) 131  Resp:  (!) 40  Temp: (!) 101.4 F (38.6 C)   SpO2:  100%   Vitals:   12/09/20 0907 12/09/20 0912 12/09/20 0924 12/09/20 0940  BP:  120/86  135/72  Pulse: (!) 116 (!) 117  (!) 131  Resp: (!) 40 (!) 44  (!) 40  Temp:  100.3 F (37.9 C) (!) 101.4 F (38.6 C)   TempSrc:  Axillary Rectal   SpO2: 96% 98%  100%  Weight:      Height:        Appears tired on facemask rhonchorus Tachy, regular s1/s2 Soft benign No edema Awakens.    The results of significant diagnostics from this hospitalization (including imaging, microbiology, ancillary and laboratory) are listed below for reference.     Microbiology: Recent Results (from the past 240 hour(s))  Culture, blood (Routine x 2)     Status: None (Preliminary result)   Collection Time: 12/06/20 10:41 AM   Specimen: BLOOD LEFT FOREARM  Result Value Ref Range Status   Specimen Description BLOOD LEFT FOREARM  Final   Special Requests   Final    BOTTLES DRAWN AEROBIC AND ANAEROBIC Blood Culture results may not be optimal due to an inadequate volume of blood received in culture bottles   Culture    Final    NO GROWTH 3 DAYS Performed at [facility], [place] [address], [place], [place] [address]    Report Status PENDING  Incomplete  Resp Panel by RT-PCR (Flu A&B, Covid) Nasopharyngeal Swab     Status: None   Collection Time: 12/06/20 10:41 AM   Specimen: Nasopharyngeal Swab; Nasopharyngeal(NP) swabs in vial transport medium  Result Value Ref Range Status   SARS Coronavirus 2 by RT PCR NEGATIVE NEGATIVE Final    Comment: (NOTE) SARS-CoV-2 target nucleic acids are NOT DETECTED.  The SARS-CoV-2 RNA is generally detectable in upper respiratory specimens during the acute phase of infection. The lowest concentration of SARS-CoV-2 viral copies this assay can detect is 138 copies/mL. A negative result does not preclude SARS-Cov-2 infection and should not be used as the sole basis for treatment or other patient management decisions. A negative result may occur with  improper specimen collection/handling, submission of specimen other than nasopharyngeal swab, presence of viral mutation(s) within the areas targeted by this assay, and inadequate number of viral copies(<138 copies/mL). A negative result must be combined with clinical observations, patient history, and epidemiological information. The expected result is Negative.  Fact Sheet for Patients:  EntrepreneurPulse.com.au  Fact Sheet for Healthcare Providers:  IncredibleEmployment.be  This test is no t yet approved or cleared by the [place] FDA and  has been authorized for detection and/or diagnosis of SARS-CoV-2 by FDA under an Emergency Use Authorization (EUA). This EUA will remain  in effect (meaning this test can be used) for the duration of the COVID-19 declaration under Section 564(b)(1) of the Act, 21 U.S.C.section 360bbb-3(b)(1), unless the  authorization is terminated  or revoked sooner.       Influenza A by PCR NEGATIVE NEGATIVE Final   Influenza B by PCR NEGATIVE  NEGATIVE Final    Comment: (NOTE) The Xpert Xpress SARS-CoV-2/FLU/RSV plus assay is intended as an aid in the diagnosis of influenza from Nasopharyngeal swab specimens and should not be used as a sole basis for treatment. Nasal washings and aspirates are unacceptable for Xpert Xpress SARS-CoV-2/FLU/RSV testing.  Fact Sheet for Patients: EntrepreneurPulse.com.au  Fact Sheet for Healthcare Providers: IncredibleEmployment.be  This test is not yet approved or cleared by the [place] FDA and has been authorized for detection and/or diagnosis of SARS-CoV-2 by FDA under an Emergency Use Authorization (EUA). This EUA will remain in effect (meaning this test can be used) for the duration of the COVID-19 declaration under Section 564(b)(1) of the Act, 21 U.S.C. section 360bbb-3(b)(1), unless the authorization is terminated or revoked.  Performed at [facility], [place] [address] [place], [place] [address]   Culture, blood (Routine x 2)     Status: None (Preliminary result)   Collection Time: 12/06/20 10:42 AM   Specimen: BLOOD  Result Value Ref Range Status   Specimen Description BLOOD LEFT ANTECUBITAL  Final   Special Requests   Final    BOTTLES DRAWN AEROBIC ONLY Blood Culture adequate volume   Culture   Final    NO GROWTH 3 DAYS Performed at [facility], [place] [address], [place], [place] [address]    Report Status PENDING  Incomplete  Urine culture     Status: Abnormal (Preliminary result)   Collection Time: 12/06/20  1:03 PM   Specimen: In/Out Cath Urine  Result Value Ref Range Status   Specimen Description IN/OUT CATH URINE  Final   Special Requests NONE  Final   Culture (A)  Final    3,000 COLONIES/mL PROTEUS MIRABILIS 3,000 COLONIES/mL STAPHYLOCOCCUS EPIDERMIDIS SUSCEPTIBILITIES TO FOLLOW    Report Status PENDING  Incomplete   Organism ID, Bacteria PROTEUS MIRABILIS (A)  Final      Susceptibility   Proteus mirabilis -  MIC*    AMPICILLIN <=2 SENSITIVE Sensitive     CEFAZOLIN <=4 SENSITIVE Sensitive     CEFEPIME <=0.12 SENSITIVE Sensitive     CEFTRIAXONE <=0.25 SENSITIVE Sensitive     CIPROFLOXACIN <=0.25 SENSITIVE Sensitive     GENTAMICIN <=1 SENSITIVE Sensitive     IMIPENEM 4 SENSITIVE Sensitive     NITROFURANTOIN 128 RESISTANT Resistant     TRIMETH/SULFA <=20 SENSITIVE Sensitive     AMPICILLIN/SULBACTAM <=2 SENSITIVE Sensitive     PIP/TAZO Value in next row Sensitive      <=4 SENSITIVEPerformed at [place] [address] [place], [place] [address]    * 3,000 COLONIES/mL PROTEUS MIRABILIS  MRSA PCR Screening     Status: None   Collection Time: 12/07/20  4:30 AM   Specimen: Nasal Mucosa; Nasopharyngeal  Result Value Ref Range Status   MRSA by PCR NEGATIVE NEGATIVE Final    Comment:        The GeneXpert MRSA Assay (FDA approved for NASAL specimens only), is one component of a comprehensive MRSA colonization surveillance program. It is not intended to diagnose MRSA infection nor to guide or monitor treatment for MRSA infections. Performed at [facility], [place] [address] [place],  [address]      Labs: BNP (last 3 results) Recent Labs    12/08/20 0850  BNP 2,684.6*  Basic Metabolic Panel: Recent Labs  Lab 12/06/20 1005 12/06/20 1041 12/07/20 0702 12/08/20 0828 12/09/20 0220  NA 138 139 142 144 147*  K 4.7 5.0 3.9 4.5 4.3  CL  --  104 108 109 110  CO2  --  19* 21* 21* 19*  GLUCOSE  --  99 82 121* 110*  BUN  --  88* 69* 63* 73*  CREATININE  --  2.98* 2.66* 2.85* 3.20*  CALCIUM  --  7.9* 8.2* 8.2* 8.0*  MG  --   --  1.2*  --   --   PHOS  --   --  3.9  --   --    Liver Function Tests: Recent Labs  Lab 12/06/20 1041 12/07/20 0702 12/08/20 0828 12/09/20 0220  AST 50* 31 42* 33  ALT _0 ALKPHOS 74 75 87 77  BILITOT 0.8 1.0 0.7 1.0  PROT 5.1* 5.2* 5.2* 4.8*  ALBUMIN 2.1* 2.0* 1.8* 1.6*   No results for input(s): LIPASE, AMYLASE in the  last 168 hours. No results for input(s): AMMONIA in the last 168 hours. CBC: Recent Labs  Lab 12/06/20 1005 12/06/20 1041 12/07/20 0702 12/08/20 0828 12/09/20 0220  WBC  --  3.7* 5.5 5.9 8.9  NEUTROABS  --  1.6* 2.0 2.4 3.2  HGB 7.8* 8.4* 8.3* 8.6* 11.2*  HCT 23.0* 27.0* 25.6* 26.6* 33.1*  MCV  --  94.4 91.8 91.7 89.0  PLT  --  93* 86* 87* 62*   Cardiac Enzymes: Recent Labs  Lab 12/06/20 1947  CKTOTAL 63   BNP: Invalid input(s): POCBNP CBG: No results for input(s): GLUCAP in the last 168 hours. D-Dimer Recent Labs    12/08/20 1007  DDIMER >20.00*   Hgb A1c No results for input(s): HGBA1C in the last 72 hours. Lipid Profile No results for input(s): CHOL, HDL, LDLCALC, TRIG, CHOLHDL, LDLDIRECT in the last 72 hours. Thyroid function studies No results for input(s): TSH, T4TOTAL, T3FREE, THYROIDAB in the last 72 hours.  Invalid input(s): FREET3 Anemia work up No results for input(s): VITAMINB12, FOLATE, FERRITIN, TIBC, IRON, RETICCTPCT in the last 72 hours. Urinalysis    Component Value Date/Time   COLORURINE STRAW (A) 12/06/2020 1041   APPEARANCEUR CLEAR 12/06/2020 1041   LABSPEC 1.009 12/06/2020 1041   LABSPEC 1.020 09/17/2013 1036   PHURINE 5.0 12/06/2020 1041   GLUCOSEU NEGATIVE 12/06/2020 1041   HGBUR MODERATE (A) 12/06/2020 1041   BILIRUBINUR NEGATIVE 12/06/2020 1041   KETONESUR NEGATIVE 12/06/2020 1041   PROTEINUR 100 (A) 12/06/2020 1041   UROBILINOGEN 0.2 09/17/2013 1036   NITRITE NEGATIVE 12/06/2020 1041   LEUKOCYTESUR NEGATIVE 12/06/2020 1041   Sepsis Labs Invalid input(s): PROCALCITONIN,  WBC,  LACTICIDVEN Microbiology Recent Results (from the past 240 hour(s))  Culture, blood (Routine x 2)     Status: None (Preliminary result)   Collection Time: 12/06/20 10:41 AM   Specimen: BLOOD LEFT FOREARM  Result Value Ref Range Status   Specimen Description BLOOD LEFT FOREARM  Final   Special Requests   Final    BOTTLES DRAWN AEROBIC AND ANAEROBIC  Blood Culture results may not be optimal due to an inadequate volume of blood received in culture bottles   Culture   Final    NO GROWTH 3 DAYS Performed at [facility], [place] [address], [place], [place] [address]    Report Status PENDING  Incomplete  Resp Panel by RT-PCR (Flu A&B, Covid) Nasopharyngeal Swab     Status: None  Collection Time: 12/06/20 10:41 AM   Specimen: Nasopharyngeal Swab; Nasopharyngeal(NP) swabs in vial transport medium  Result Value Ref Range Status   SARS Coronavirus 2 by RT PCR NEGATIVE NEGATIVE Final    Comment: (NOTE) SARS-CoV-2 target nucleic acids are NOT DETECTED.  The SARS-CoV-2 RNA is generally detectable in upper respiratory specimens during the acute phase of infection. The lowest concentration of SARS-CoV-2 viral copies this assay can detect is 138 copies/mL. A negative result does not preclude SARS-Cov-2 infection and should not be used as the sole basis for treatment or other patient management decisions. A negative result may occur with  improper specimen collection/handling, submission of specimen other than nasopharyngeal swab, presence of viral mutation(s) within the areas targeted by this assay, and inadequate number of viral copies(<138 copies/mL). A negative result must be combined with clinical observations, patient history, and epidemiological information. The expected result is Negative.  Fact Sheet for Patients:  EntrepreneurPulse.com.au  Fact Sheet for Healthcare Providers:  IncredibleEmployment.be  This test is no t yet approved or cleared by the [place] FDA and  has been authorized for detection and/or diagnosis of SARS-CoV-2 by FDA under an Emergency Use Authorization (EUA). This EUA will remain  in effect (meaning this test can be used) for the duration of the COVID-19 declaration under Section 564(b)(1) of the Act, 21 U.S.C.section 360bbb-3(b)(1), unless the authorization is  terminated  or revoked sooner.       Influenza A by PCR NEGATIVE NEGATIVE Final   Influenza B by PCR NEGATIVE NEGATIVE Final    Comment: (NOTE) The Xpert Xpress SARS-CoV-2/FLU/RSV plus assay is intended as an aid in the diagnosis of influenza from Nasopharyngeal swab specimens and should not be used as a sole basis for treatment. Nasal washings and aspirates are unacceptable for Xpert Xpress SARS-CoV-2/FLU/RSV testing.  Fact Sheet for Patients: EntrepreneurPulse.com.au  Fact Sheet for Healthcare Providers: IncredibleEmployment.be  This test is not yet approved or cleared by the [place] FDA and has been authorized for detection and/or diagnosis of SARS-CoV-2 by FDA under an Emergency Use Authorization (EUA). This EUA will remain in effect (meaning this test can be used) for the duration of the COVID-19 declaration under Section 564(b)(1) of the Act, 21 U.S.C. section 360bbb-3(b)(1), unless the authorization is terminated or revoked.  Performed at [facility], [place] [address] [place], [place] [address]   Culture, blood (Routine x 2)     Status: None (Preliminary result)   Collection Time: 12/06/20 10:42 AM   Specimen: BLOOD  Result Value Ref Range Status   Specimen Description BLOOD LEFT ANTECUBITAL  Final   Special Requests   Final    BOTTLES DRAWN AEROBIC ONLY Blood Culture adequate volume   Culture   Final    NO GROWTH 3 DAYS Performed at [facility], [place] [address], [place], [place] [address]    Report Status PENDING  Incomplete  Urine culture     Status: Abnormal (Preliminary result)   Collection Time: 12/06/20  1:03 PM   Specimen: In/Out Cath Urine  Result Value Ref Range Status   Specimen Description IN/OUT CATH URINE  Final   Special Requests NONE  Final   Culture (A)  Final    3,000 COLONIES/mL PROTEUS MIRABILIS 3,000 COLONIES/mL STAPHYLOCOCCUS EPIDERMIDIS SUSCEPTIBILITIES TO FOLLOW    Report Status  PENDING  Incomplete   Organism ID, Bacteria PROTEUS MIRABILIS (A)  Final      Susceptibility   Proteus mirabilis - MIC*  AMPICILLIN <=2 SENSITIVE Sensitive     CEFAZOLIN <=4 SENSITIVE Sensitive     CEFEPIME <=0.12 SENSITIVE Sensitive     CEFTRIAXONE <=0.25 SENSITIVE Sensitive     CIPROFLOXACIN <=0.25 SENSITIVE Sensitive     GENTAMICIN <=1 SENSITIVE Sensitive     IMIPENEM 4 SENSITIVE Sensitive     NITROFURANTOIN 128 RESISTANT Resistant     TRIMETH/SULFA <=20 SENSITIVE Sensitive     AMPICILLIN/SULBACTAM <=2 SENSITIVE Sensitive     PIP/TAZO Value in next row Sensitive      <=4 SENSITIVEPerformed at [place] [address], [place], [place] [address]    * 3,000 COLONIES/mL PROTEUS MIRABILIS  MRSA PCR Screening     Status: None   Collection Time: 12/07/20  4:30 AM   Specimen: Nasal Mucosa; Nasopharyngeal  Result Value Ref Range Status   MRSA by PCR NEGATIVE NEGATIVE Final    Comment:        The GeneXpert MRSA Assay (FDA approved for NASAL specimens only), is one component of a comprehensive MRSA colonization surveillance program. It is not intended to diagnose MRSA infection nor to guide or monitor treatment for MRSA infections. Performed at [facility], [place] [address] [place], [place] [address]      Time coordinating discharge: Over 30 minutes  SIGNED:   Nolberto Hanlon, MD  [facility] 12/09/2020, 3:43 PM Pager   If 7PM-7AM, please contact night-coverage [email or web address] Password [email or web address]

## 2020-12-09 NOTE — Progress Notes (Signed)
Rapid response called this am at 0902 due to patient increased work of breathing, RR 44, need for increased O2 support and tachycardia. Rapid response nurse called and given report on patient, pt placed on non-rebreather per rr nurse request, and rr nurse came to bedside to assess patient. MD notified and also came to bedside to assess patient.

## 2020-12-09 NOTE — Progress Notes (Signed)
PROGRESS NOTE    Christian Burns  QZR:[id number] DOB: 1930-03-05 DOA: 12/06/2020 PCP: Leanna Battles, MD    Brief Narrative:  Christian Burns is a 87 y.o. male with medical history significant for multiple myeloma complicated by stage IIIa chronic kidney disease with baseline creatinine 1.5-1.8, chronic anemia with baseline hemoglobin 8-10, hypertension, BPH, who is admitted to [facility] on 12/06/2020 with severe sepsis due to right upper lobe pneumonia after presenting from home to [facility] ED complaining of shortness of breath.   4/29-more sob this am. Overnight events noted. Was given lasix iv x2. This am requiring more 02 at 6L.  cxr with mildly progression of opacity. Some atelactasia.  4/30-patient was on facemask rapid response was called this AM.  He was tachypneic but able to answer questions. Spoke to son this am. Updated pt's condition. They are interested in moving forward to sending pt to hospice house , but meanwhile as pt still in hospital, we will continue on some of his meds, FM prn , and the ivabx. Other family members are coming from out of town to see pt.    :  Consultants:   Oncology  Procedures:   Antimicrobials:   vanco and cefipime 4/27//DC'd on 4/29  Meropenem 4/29     Subjective: On fm, mildly tachypnic and tachycardic  Objective: Vitals:   12/09/20 0907 12/09/20 0912 12/09/20 0924 12/09/20 0940  BP:  120/86  135/72  Pulse: (!) 116 (!) 117  (!) 131  Resp: (!) 40 (!) 44  (!) 40  Temp:  100.3 F (37.9 C) (!) 101.4 F (38.6 C)   TempSrc:  Axillary Rectal   SpO2: 96% 98%  100%  Weight:      Height:        Intake/Output Summary (Last 24 hours) at 12/09/2020 1345 Last data filed at 12/09/2020 0000 Gross per 24 hour  Intake 822.92 ml  Output 750 ml  Net 72.92 ml   Filed Weights   12/06/20 0956  Weight: 54.4 kg    Examination: Appears tired on facemask rhonchorus Tachy, regular s1/s2 Soft benign No edema Awakens.  Mood and affect  appropriate in current setting   Data Reviewed: I have personally reviewed following labs and imaging studies  CBC: Recent Labs  Lab 12/06/20 1005 12/06/20 1041 12/07/20 0702 12/08/20 0828 12/09/20 0220  WBC  --  3.7* 5.5 5.9 8.9  NEUTROABS  --  1.6* 2.0 2.4 3.2  HGB 7.8* 8.4* 8.3* 8.6* 11.2*  HCT 23.0* 27.0* 25.6* 26.6* 33.1*  MCV  --  94.4 91.8 91.7 89.0  PLT  --  93* 86* 87* 62*   Basic Metabolic Panel: Recent Labs  Lab 12/06/20 1005 12/06/20 1041 12/07/20 0702 12/08/20 0828 12/09/20 0220  NA 138 139 142 144 147*  K 4.7 5.0 3.9 4.5 4.3  CL  --  104 108 109 110  CO2  --  19* 21* 21* 19*  GLUCOSE  --  99 82 121* 110*  BUN  --  88* 69* 63* 73*  CREATININE  --  2.98* 2.66* 2.85* 3.20*  CALCIUM  --  7.9* 8.2* 8.2* 8.0*  MG  --   --  1.2*  --   --   PHOS  --   --  3.9  --   --    GFR: Estimated Creatinine Clearance: 12.3 mL/min (A) (by C-G formula based on SCr of 3.2 mg/dL (H)). Liver Function Tests: Recent Labs  Lab 12/06/20 1041 12/07/20 0702 12/08/20  3875 12/09/20 0220  AST 50* 31 42* 33  ALT _0 ALKPHOS 74 75 87 77  BILITOT 0.8 1.0 0.7 1.0  PROT 5.1* 5.2* 5.2* 4.8*  ALBUMIN 2.1* 2.0* 1.8* 1.6*   No results for input(s): LIPASE, AMYLASE in the last 168 hours. No results for input(s): AMMONIA in the last 168 hours. Coagulation Profile: Recent Labs  Lab 12/06/20 1041  INR 1.2   Cardiac Enzymes: Recent Labs  Lab 12/06/20 1947  CKTOTAL 63   BNP (last 3 results) No results for input(s): PROBNP in the last 8760 hours. HbA1C: No results for input(s): HGBA1C in the last 72 hours. CBG: No results for input(s): GLUCAP in the last 168 hours. Lipid Profile: No results for input(s): CHOL, HDL, LDLCALC, TRIG, CHOLHDL, LDLDIRECT in the last 72 hours. Thyroid Function Tests: No results for input(s): TSH, T4TOTAL, FREET4, T3FREE, THYROIDAB in the last 72 hours. Anemia Panel: No results for input(s): VITAMINB12, FOLATE, FERRITIN, TIBC, IRON,  RETICCTPCT in the last 72 hours. Sepsis Labs: Recent Labs  Lab 12/06/20 1041 12/06/20 1245  LATICACIDVEN 3.2* 1.4    Recent Results (from the past 240 hour(s))  Culture, blood (Routine x 2)     Status: None (Preliminary result)   Collection Time: 12/06/20 10:41 AM   Specimen: BLOOD LEFT FOREARM  Result Value Ref Range Status   Specimen Description BLOOD LEFT FOREARM  Final   Special Requests   Final    BOTTLES DRAWN AEROBIC AND ANAEROBIC Blood Culture results may not be optimal due to an inadequate volume of blood received in culture bottles   Culture   Final    NO GROWTH 3 DAYS Performed at [facility], [place] [address] [place], [place] [address]    Report Status PENDING  Incomplete  Resp Panel by RT-PCR (Flu A&B, Covid) Nasopharyngeal Swab     Status: None   Collection Time: 12/06/20 10:41 AM   Specimen: Nasopharyngeal Swab; Nasopharyngeal(NP) swabs in vial transport medium  Result Value Ref Range Status   SARS Coronavirus 2 by RT PCR NEGATIVE NEGATIVE Final    Comment: (NOTE) SARS-CoV-2 target nucleic acids are NOT DETECTED.  The SARS-CoV-2 RNA is generally detectable in upper respiratory specimens during the acute phase of infection. The lowest concentration of SARS-CoV-2 viral copies this assay can detect is 138 copies/mL. A negative result does not preclude SARS-Cov-2 infection and should not be used as the sole basis for treatment or other patient management decisions. A negative result may occur with  improper specimen collection/handling, submission of specimen other than nasopharyngeal swab, presence of viral mutation(s) within the areas targeted by this assay, and inadequate number of viral copies(<138 copies/mL). A negative result must be combined with clinical observations, patient history, and epidemiological information. The expected result is Negative.  Fact Sheet for Patients:  EntrepreneurPulse.com.au  Fact Sheet for Healthcare  Providers:  IncredibleEmployment.be  This test is no t yet approved or cleared by the [place] FDA and  has been authorized for detection and/or diagnosis of SARS-CoV-2 by FDA under an Emergency Use Authorization (EUA). This EUA will remain  in effect (meaning this test can be used) for the duration of the COVID-19 declaration under Section 564(b)(1) of the Act, 21 U.S.C.section 360bbb-3(b)(1), unless the authorization is terminated  or revoked sooner.       Influenza A by PCR NEGATIVE NEGATIVE Final   Influenza B by PCR NEGATIVE NEGATIVE Final    Comment: (NOTE) The Xpert Xpress SARS-CoV-2/FLU/RSV  plus assay is intended as an aid in the diagnosis of influenza from Nasopharyngeal swab specimens and should not be used as a sole basis for treatment. Nasal washings and aspirates are unacceptable for Xpert Xpress SARS-CoV-2/FLU/RSV testing.  Fact Sheet for Patients: EntrepreneurPulse.com.au  Fact Sheet for Healthcare Providers: IncredibleEmployment.be  This test is not yet approved or cleared by the [place] FDA and has been authorized for detection and/or diagnosis of SARS-CoV-2 by FDA under an Emergency Use Authorization (EUA). This EUA will remain in effect (meaning this test can be used) for the duration of the COVID-19 declaration under Section 564(b)(1) of the Act, 21 U.S.C. section 360bbb-3(b)(1), unless the authorization is terminated or revoked.  Performed at [facility], [place] [address] [place], [place] [address]   Culture, blood (Routine x 2)     Status: None (Preliminary result)   Collection Time: 12/06/20 10:42 AM   Specimen: BLOOD  Result Value Ref Range Status   Specimen Description BLOOD LEFT ANTECUBITAL  Final   Special Requests   Final    BOTTLES DRAWN AEROBIC ONLY Blood Culture adequate volume   Culture   Final    NO GROWTH 3 DAYS Performed at [facility], [place] [address],  [place], [place] [address]    Report Status PENDING  Incomplete  Urine culture     Status: Abnormal (Preliminary result)   Collection Time: 12/06/20  1:03 PM   Specimen: In/Out Cath Urine  Result Value Ref Range Status   Specimen Description IN/OUT CATH URINE  Final   Special Requests NONE  Final   Culture (A)  Final    3,000 COLONIES/mL PROTEUS MIRABILIS 3,000 COLONIES/mL STAPHYLOCOCCUS EPIDERMIDIS SUSCEPTIBILITIES TO FOLLOW    Report Status PENDING  Incomplete   Organism ID, Bacteria PROTEUS MIRABILIS (A)  Final      Susceptibility   Proteus mirabilis - MIC*    AMPICILLIN <=2 SENSITIVE Sensitive     CEFAZOLIN <=4 SENSITIVE Sensitive     CEFEPIME <=0.12 SENSITIVE Sensitive     CEFTRIAXONE <=0.25 SENSITIVE Sensitive     CIPROFLOXACIN <=0.25 SENSITIVE Sensitive     GENTAMICIN <=1 SENSITIVE Sensitive     IMIPENEM 4 SENSITIVE Sensitive     NITROFURANTOIN 128 RESISTANT Resistant     TRIMETH/SULFA <=20 SENSITIVE Sensitive     AMPICILLIN/SULBACTAM <=2 SENSITIVE Sensitive     PIP/TAZO Value in next row Sensitive      <=4 SENSITIVEPerformed at [facility] [address], [place], [place] [address]    * 3,000 COLONIES/mL PROTEUS MIRABILIS  MRSA PCR Screening     Status: None   Collection Time: 12/07/20  4:30 AM   Specimen: Nasal Mucosa; Nasopharyngeal  Result Value Ref Range Status   MRSA by PCR NEGATIVE NEGATIVE Final    Comment:        The GeneXpert MRSA Assay (FDA approved for NASAL specimens only), is one component of a comprehensive MRSA colonization surveillance program. It is not intended to diagnose MRSA infection nor to guide or monitor treatment for MRSA infections. Performed at [facility], [place] [address] [place],  [address]          Radiology Studies: DG CHEST PORT 1 VIEW  Result Date: 12/07/2020 CLINICAL DATA:  87 year old male with shortness of breath. EXAM: PORTABLE CHEST 1 VIEW COMPARISON:  Chest radiograph dated 12/06/2020. FINDINGS:  Right-sided Port-A-Cath in similar position. There is background of emphysema. Patchy area of opacity in the right upper lobe  similar or minimally increased compared to the prior radiograph. Left lung base atelectasis. A small left pleural effusion may be present. No pneumothorax. Stable cardiomegaly. Cardiac loop recorder device. Degenerative changes of the spine. No acute osseous pathology. IMPRESSION: 1. No significant interval change in the right upper lobe opacity. 2. Emphysema. 3. Stable cardiomegaly. Electronically Signed   By: Anner Crete M.D.   On: 12/07/2020 19:29   DG Swallowing Func-Speech Pathology  Result Date: 12/07/2020 Objective Swallowing Evaluation: Type of Study: MBS-Modified Barium Swallow Study  Patient Details Name: ULISES WOLFINGER MRN: [id number] Date of Birth: 09/09/1929 Today's Date: 12/07/2020 Time: SLP Start Time (ACUTE ONLY): 1400 -SLP Stop Time (ACUTE ONLY): 1430 SLP Time Calculation (min) (ACUTE ONLY): 30 min Past Medical History: Past Medical History: Diagnosis Date . Anemia  . Blood transfusion without reported diagnosis  . CKD (chronic kidney disease) 07/06/2018  CKD stage III . Goals of care, counseling/discussion 02/04/2019 . History of radiation therapy 06/17/13-07/06/13  35 gray to upper lumbar spine . 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 Hypertension  . Inguinal hernia  . Multiple myeloma [facility]) Sept 2010 . Multiple myeloma in relapse ([place]) 04/05/2016 . Radiation 09/26/15-10/20/15  lower thoracic spine, upper lumbar spine 30 gray . Stroke [facility])  Past Surgical History: Past Surgical History: Procedure Laterality Date . COLONOSCOPY   . HIP PINNING,CANNULATED Right 09/01/2018  Procedure: [facility] NAIL HIP PINNING;  Surgeon: Melrose Nakayama, MD;  Location: [facility];  Service: Orthopedics;  Laterality: Right; . IR IMAGING GUIDED PORT INSERTION  11/01/2019 . LOOP RECORDER INSERTION N/A 04/21/2019  Procedure: LOOP RECORDER INSERTION;  Surgeon: Evans Lance, MD;  Location: [place] [facility];  Service: Cardiovascular;  Laterality: N/A; . TEE WITHOUT CARDIOVERSION N/A 10/09/2018  Procedure: TRANSESOPHAGEAL ECHOCARDIOGRAM (TEE);  Surgeon: Thayer Headings, MD;  Location: [facility] ENDOSCOPY;  Service: Cardiovascular;  Laterality: N/A; . TONSILLECTOMY   HPI: 87 y.o. male with medical history significant for multiple myeloma (actively receiving chemotherapy and support of Red cell production, and has had radiation therapy for plasmacytoma) complicated by stage IIIa chronic kidney disease, chronic anemia, hypertension, CVA, BPH, dysphagia, upper GI bleeding (09/2019) who is admitted to [facility] on 12/06/2020 with severe sepsis due to right upper lobe pneumonia after presenting from home to [facility] ED complaining of shortness of breath. Esophagram (09/27/20) revealed "small-volume aspiration of thick barium and Moderate intermittent esophageal dysmotility". MBS (09/28/20) mild-moderate pharyngoesophageal dysphagia characterized by penetration into laryngeal vestibule with larger volumes of successive swallows with thin liquids, but no aspiration noted with any consistencies. Decreased UES opening and Esophageal backflow noted into cervical esophagus intermittently with pill/solids/puree. Soft/thin liquid diet recommended at that time. Failed [facility] 12/06/20.  Subjective: "I have had issues with swallowing for a while" Assessment / Plan / Recommendation CHL IP CLINICAL IMPRESSIONS 12/07/2020 Clinical Impression Pt presents with primary pharyngoesophageal dysphagia marked by trace peentration/aspiration of liquids, reduced epiglottic inversion and relaxation of UES resulting in moderate pharyngeal residue and esophageal backflow of POs into pyriform sinuses. Small cup sips of thin resulted in penetration (PAS 2) and trace aspiration (PAS 6) with consecutive sips. Trials of NTL only increased pharyngeal residuals. Pureed solids also significant for pharyngeal residuals, which improved minimally with  effortful swallows, throat clear/re swallows, however minimal residuals remained in vallecua, pyriforms and at UES junction. Overall, residuals and esophageal backflow into cervical esophagus increased with thicker textures/consistencies, however msot POs did not compromise airway. Recommend pt continue on dysphagia 2, thin liquid diet (no straw). Educated pt  on taking small sips/bites at slow rate, performing effortful swallows, throat clear/re swallows and sitting upright for all meals (remaining upright for 30 minutes after as well). Recommend esophageal assessement given this component impacts pt aspiration risk. SLP Visit Diagnosis Dysphagia, pharyngoesophageal phase (R13.14) Attention and concentration deficit following -- Frontal lobe and executive function deficit following -- Impact on safety and function Mild aspiration risk   CHL IP TREATMENT RECOMMENDATION 12/07/2020 Treatment Recommendations Therapy as outlined in treatment plan below   Prognosis 12/07/2020 Prognosis for Safe Diet Advancement Fair Barriers to Reach Goals Time post onset Barriers/Prognosis Comment -- CHL IP DIET RECOMMENDATION 12/07/2020 SLP Diet Recommendations Thin liquid;Dysphagia 2 (Fine chop) solids Liquid Administration via No straw;Cup Medication Administration Crushed with puree Compensations Slow rate;Small sips/bites;Clear throat intermittently;Effortful swallow Postural Changes Remain semi-upright after after feeds/meals (Comment);Seated upright at 90 degrees   CHL IP OTHER RECOMMENDATIONS 12/07/2020 Recommended Consults Consider esophageal assessment Oral Care Recommendations Oral care BID Other Recommendations --   CHL IP FOLLOW UP RECOMMENDATIONS 12/07/2020 Follow up Recommendations (No Data)   CHL IP FREQUENCY AND DURATION 12/07/2020 Speech Therapy Frequency (ACUTE ONLY) min 2x/week Treatment Duration 2 weeks      CHL IP ORAL PHASE 12/07/2020 Oral Phase WFL Oral - Pudding Teaspoon -- Oral - Pudding Cup -- Oral - Honey Teaspoon --  Oral - Honey Cup -- Oral - Nectar Teaspoon -- Oral - Nectar Cup -- Oral - Nectar Straw -- Oral - Thin Teaspoon -- Oral - Thin Cup -- Oral - Thin Straw -- Oral - Puree -- Oral - Mech Soft -- Oral - Regular -- Oral - Multi-Consistency -- Oral - Pill -- Oral Phase - Comment --  CHL IP PHARYNGEAL PHASE 12/07/2020 Pharyngeal Phase Impaired Pharyngeal- Pudding Teaspoon -- Pharyngeal -- Pharyngeal- Pudding Cup -- Pharyngeal -- Pharyngeal- Honey Teaspoon -- Pharyngeal -- Pharyngeal- Honey Cup -- Pharyngeal -- Pharyngeal- Nectar Teaspoon NT Pharyngeal -- Pharyngeal- Nectar Cup Reduced epiglottic inversion;Reduced pharyngeal peristalsis;Pharyngeal residue - posterior pharnyx;Pharyngeal residue - pyriform;Pharyngeal residue - valleculae;Pharyngeal residue - cp segment Pharyngeal -- Pharyngeal- Nectar Straw NT Pharyngeal -- Pharyngeal- Thin Teaspoon NT Pharyngeal -- Pharyngeal- Thin Cup Reduced epiglottic inversion;Pharyngeal residue - posterior pharnyx;Pharyngeal residue - pyriform;Pharyngeal residue - valleculae;Penetration/Aspiration during swallow;Pharyngeal residue - cp segment Pharyngeal Material enters airway, remains ABOVE vocal cords then ejected out Pharyngeal- Thin Straw Pharyngeal residue - posterior pharnyx;Pharyngeal residue - pyriform;Pharyngeal residue - valleculae;Pharyngeal residue - cp segment Pharyngeal Material enters airway, passes BELOW cords then ejected out Pharyngeal- Puree Reduced epiglottic inversion;Pharyngeal residue - valleculae;Pharyngeal residue - pyriform;Pharyngeal residue - posterior pharnyx;Pharyngeal residue - cp segment Pharyngeal -- Pharyngeal- Mechanical Soft NT Pharyngeal -- Pharyngeal- Regular Pharyngeal residue - valleculae;Pharyngeal residue - pyriform;Pharyngeal residue - cp segment;Pharyngeal residue - posterior pharnyx;Reduced epiglottic inversion Pharyngeal -- Pharyngeal- Multi-consistency -- Pharyngeal -- Pharyngeal- Pill -- Pharyngeal -- Pharyngeal Comment --  CHL IP CERVICAL  ESOPHAGEAL PHASE 12/07/2020 Cervical Esophageal Phase Impaired Pudding Teaspoon -- Pudding Cup -- Honey Teaspoon -- Honey Cup -- Nectar Teaspoon NT Nectar Cup Esophageal backflow into the pharynx;Esophageal backflow into cervical esophagus;Reduced cricopharyngeal relaxation Nectar Straw NT Thin Teaspoon NT Thin Cup Esophageal backflow into cervical esophagus;Esophageal backflow into the pharynx;Reduced cricopharyngeal relaxation Thin Straw Esophageal backflow into the pharynx;Esophageal backflow into cervical esophagus;Reduced cricopharyngeal relaxation Puree Esophageal backflow into the pharynx;Esophageal backflow into cervical esophagus;Reduced cricopharyngeal relaxation Mechanical Soft -- Regular Reduced cricopharyngeal relaxation;Esophageal backflow into cervical esophagus;Esophageal backflow into the pharynx Multi-consistency -- Pill -- Cervical Esophageal Comment -- Ellwood Dense, MA, [place] Office  Number: [phone] Acie Fredrickson 12/07/2020, 4:32 PM              ECHOCARDIOGRAM COMPLETE  Result Date: 12/08/2020    ECHOCARDIOGRAM REPORT   Patient Name:   CHACE KLIPPEL [facility] Date of Exam: 12/08/2020 Medical Rec #:  [id number]     Height:       70.0 in Accession #:    [id number]    Weight:       120.0 lb Date of Birth:  06/02/1930     BSA:          1.680 m Patient Age:    51 years      BP:           150/77 mmHg Patient Gender: M             HR:           93 bpm. Exam Location:  Inpatient Procedure: 2D Echo, 3D Echo, Cardiac Doppler, Color Doppler and Strain Analysis Indications:    R07.9* Chest pain, unspecified. Elevated troponin.  History:        Patient has prior history of Echocardiogram examinations, most                 recent 04/20/2019. Signs/Symptoms:Shortness of Breath and                 Bacteremia; Risk Factors:Hypertension. Multiple myeloma.                 Chemotherapy.  Sonographer:    Roseanna Rainbow RDCS Referring Phys: [id number] [facility]  Sonographer Comments:  Technically difficult study due to poor echo windows. Global longitudinal strain was attempted. IMPRESSIONS  1. Mild inferior hypokinesis. Left ventricular ejection fraction, by estimation, is 50 to 55%. The left ventricle has low normal function. The left ventricle demonstrates regional wall motion abnormalities (see scoring diagram/findings for description).  There is moderate concentric left ventricular hypertrophy. Left ventricular diastolic parameters are consistent with Grade II diastolic dysfunction (pseudonormalization).  2. Right ventricular systolic function is normal. The right ventricular size is normal. There is normal pulmonary artery systolic pressure.  3. Left atrial size was severely dilated.  4. The mitral valve is normal in structure. Mild mitral valve regurgitation. No evidence of mitral stenosis.  5. The aortic valve is normal in structure. There is moderate calcification of the aortic valve. There is moderate thickening of the aortic valve. Aortic valve regurgitation is not visualized. No aortic stenosis is present.  6. The inferior vena cava is normal in size with greater than 50% respiratory variability, suggesting right atrial pressure of 3 mmHg. FINDINGS  Left Ventricle: Mild inferior hypokinesis. Left ventricular ejection fraction, by estimation, is 50 to 55%. The left ventricle has low normal function. The left ventricle demonstrates regional wall motion abnormalities. Global longitudinal strain performed but not reported based on interpreter judgement due to suboptimal tracking. The left ventricular internal cavity size was normal in size. There is moderate concentric left ventricular hypertrophy. Left ventricular diastolic parameters are consistent with Grade II diastolic dysfunction (pseudonormalization). Indeterminate filling pressures. Right Ventricle: The right ventricular size is normal. No increase in right ventricular wall thickness. Right ventricular systolic function is normal.  There is normal pulmonary artery systolic pressure. The tricuspid regurgitant velocity is 2.76 m/s, and  with an assumed right atrial pressure of 3 mmHg, the estimated right ventricular systolic pressure is 62.9 mmHg. Left Atrium: Left atrial size was severely dilated. Right Atrium: Right atrial size was  normal in size. Pericardium: There is no evidence of pericardial effusion. Mitral Valve: The mitral valve is normal in structure. Mild mitral valve regurgitation. No evidence of mitral valve stenosis. Tricuspid Valve: The tricuspid valve is normal in structure. Tricuspid valve regurgitation is mild . No evidence of tricuspid stenosis. Aortic Valve: The aortic valve is normal in structure. There is moderate calcification of the aortic valve. There is moderate thickening of the aortic valve. Aortic valve regurgitation is not visualized. No aortic stenosis is present. Pulmonic Valve: The pulmonic valve was normal in structure. Pulmonic valve regurgitation is not visualized. No evidence of pulmonic stenosis. Aorta: The aortic root is normal in size and structure. Venous: The inferior vena cava is normal in size with greater than 50% respiratory variability, suggesting right atrial pressure of 3 mmHg. IAS/Shunts: No atrial level shunt detected by color flow Doppler.  LEFT VENTRICLE PLAX 2D LVIDd:         3.50 cm     Diastology LVIDs:         2.60 cm     LV e' medial:    5.55 cm/s LV PW:         1.40 cm     LV E/e' medial:  14.2 LV IVS:        1.50 cm     LV e' lateral:   7.18 cm/s LVOT diam:     2.30 cm     LV E/e' lateral: 11.0 LV SV:         95 LV SV Index:   56 LVOT Area:     4.15 cm  LV Volumes (MOD) LV vol d, MOD A2C: 57.0 ml LV vol d, MOD A4C: 90.2 ml LV vol s, MOD A2C: 31.8 ml LV vol s, MOD A4C: 35.5 ml LV SV MOD A2C:     25.2 ml LV SV MOD A4C:     90.2 ml LV SV MOD BP:      40.0 ml RIGHT VENTRICLE             IVC RV S prime:     10.00 cm/s  IVC diam: 1.80 cm TAPSE (M-mode): 1.2 cm LEFT ATRIUM           Index        RIGHT ATRIUM           Index LA diam:      3.40 cm 2.02 cm/m  RA Area:     15.90 cm LA Vol (A2C): 18.5 ml 11.01 ml/m RA Volume:   48.80 ml  29.05 ml/m LA Vol (A4C): 77.8 ml 46.31 ml/m  AORTIC VALVE LVOT Vmax:   146.00 cm/s LVOT Vmean:  114.000 cm/s LVOT VTI:    0.228 m  AORTA Ao Root diam: 3.10 cm Ao Asc diam:  2.80 cm MITRAL VALVE               TRICUSPID VALVE MV Area (PHT): 3.10 cm    TR Peak grad:   30.5 mmHg MV Decel Time: 245 msec    TR Vmax:        276.00 cm/s MV E velocity: 78.65 cm/s MV A velocity: 61.25 cm/s  SHUNTS MV E/A ratio:  1.28        Systemic VTI:  0.23 m                            Systemic Diam: 2.30 cm Skeet Latch MD Electronically signed by  Skeet Latch MD Signature Date/Time: 12/08/2020/4:09:39 PM    Final    VAS [place] LOWER EXTREMITY VENOUS (DVT)  Result Date: 12/08/2020  Lower Venous DVT Study Patient Name:  MATS JEANLOUIS [facility]  Date of Exam:   12/08/2020 Medical Rec #: [id number]      Accession #:    [id number] Date of Birth: Jan 08, 1930      Patient Gender: M Patient Age:   89Y Exam Location:  [facility] Procedure:      VAS [place] LOWER EXTREMITY VENOUS (DVT) Referring Phys: [id number] [facility] Kaedon Fanelli --------------------------------------------------------------------------------  Indications: Swelling.  Comparison Study: No prior study Performing Technologist: Maudry Mayhew MHA, RDMS, RVT, RDCS  Examination Guidelines: A complete evaluation includes B-mode imaging, spectral Doppler, color Doppler, and power Doppler as needed of all accessible portions of each vessel. Bilateral testing is considered an integral part of a complete examination. Limited examinations for reoccurring indications may be performed as noted. The reflux portion of the exam is performed with the patient in reverse Trendelenburg.  +---------+---------------+---------+-----------+----------+--------------+ RIGHT    CompressibilityPhasicitySpontaneityPropertiesThrombus Aging  +---------+---------------+---------+-----------+----------+--------------+ CFV      Full           Yes      Yes                                 +---------+---------------+---------+-----------+----------+--------------+ SFJ      Full                                                        +---------+---------------+---------+-----------+----------+--------------+ FV Prox  Full                                                        +---------+---------------+---------+-----------+----------+--------------+ FV Mid   Full                                                        +---------+---------------+---------+-----------+----------+--------------+ FV DistalFull                                                        +---------+---------------+---------+-----------+----------+--------------+ PFV      Full                                                        +---------+---------------+---------+-----------+----------+--------------+ POP      Full           Yes      Yes                                 +---------+---------------+---------+-----------+----------+--------------+  PTV      Full                                                        +---------+---------------+---------+-----------+----------+--------------+ PERO     Full                                                        +---------+---------------+---------+-----------+----------+--------------+   +---------+---------------+---------+-----------+----------+--------------+ LEFT     CompressibilityPhasicitySpontaneityPropertiesThrombus Aging +---------+---------------+---------+-----------+----------+--------------+ CFV      Full           Yes      Yes                                 +---------+---------------+---------+-----------+----------+--------------+ SFJ      Full                                                         +---------+---------------+---------+-----------+----------+--------------+ FV Prox  Full                                                        +---------+---------------+---------+-----------+----------+--------------+ FV Mid   Full                                                        +---------+---------------+---------+-----------+----------+--------------+ FV DistalFull                                                        +---------+---------------+---------+-----------+----------+--------------+ PFV      Full                                                        +---------+---------------+---------+-----------+----------+--------------+ POP      Full           Yes      Yes                                 +---------+---------------+---------+-----------+----------+--------------+ PTV      Full                                                        +---------+---------------+---------+-----------+----------+--------------+   PERO     Full                                                        +---------+---------------+---------+-----------+----------+--------------+     Summary: RIGHT: - There is no evidence of deep vein thrombosis in the lower extremity.  - No cystic structure found in the popliteal fossa.  LEFT: - There is no evidence of deep vein thrombosis in the lower extremity.  - No cystic structure found in the popliteal fossa.  *See table(s) above for measurements and observations.    Preliminary         Scheduled Meds: . sodium chloride   Intravenous Once  . ciprofloxacin  2 drop Both Eyes Q4H while awake  . lidocaine  1 patch Transdermal Q24H  . metoprolol succinate  25 mg Oral Daily  . pantoprazole  20 mg Oral Daily  . predniSONE  20 mg Oral Q breakfast   Continuous Infusions: . meropenem (MERREM) IV 1 g (12/09/20 0959)    Assessment & Plan:   Principal Problem:   CAP (community acquired pneumonia) Active Problems:   Myeloma  ([place])   Sepsis ([place])   AKI (acute kidney injury) ([place])   Dysphagia   SOB (shortness of breath)   Lactic acidosis   #) Severe sepsis due to right upper lobe pneumonia:  MRSA negative, vancomycin discontinued 4/30-continue meropenem  meropenem. Blood cultures negative to date Urine culture with 3000 Proteus Mirabella's and staph epi   #)PNA- worsening and requiring more 02/FM Cefepime was discontinued and was started on meropenem on 4/29 for broader coverage  Lasix was given due to elevated BNP however not responding  Still requiring more oxygen and now on facemask  Discussed with family as they wish to proceed with hospice house but continue with ivabx for now until he is transferred to hospice hosue No blood draws or abg , or cxr after discussion with son      #) Acute kidney injury superimposed on stage IIIa chronic kidney disease: Context of established diagnosis of stage III CKD with baseline creatinine 1.5-1.8, present creatinine noted to be elevated 2.98.  4/30- renal functioning worsening. Did not responsd to lasix , as bnp was very elevated from baseline. Discussed with son, have decided no further blood draws.    #) Dysphagia: Patient noted to fail nursing bedside swallow screen administered in the ED  SLP consulted- placed on dysphagia 2 diet Mild asp. Risk HOB elevated Asp. Precautions  4/30-sips of water as tolerated.      #) Multiple myeloma: Follows with Dr. Marin Olp as outpatient heme-onc provider, and per patient, is schedule for next infusion via Port-A-Cath tomorrow (12/07/20).   4/29-oncology input appreciated. Plan to transfuse  4/30no further transfusion       #) Essential hypertension:stabel. On low-normatensive Hold ACE    #) Benign prostatic hyperplasia: On Proscar as an outpatient. holding it here.       #) Chronic anemia: Appears associated with his multiple myeloma, with baseline hemoglobin of 8-10.   Presenting hemoglobin found to be within this range.  INR nonelevated at 1.2    #6) PT/OT consult-discontinue  #7-plan of care-hospice was consulted plan is for hospice house after evaluation    DVT prophylaxis: scd Code Status:DNR Family Communication: spoke to son in person and on  the phone.  Status is: Inpatient  Remains inpatient appropriate because:Inpatient level of care appropriate due to severity of illness   Dispo: The patient is from: Home              Anticipated d/c is to: Home              Patient currently is not medically stable to d/c.   Difficult to place patient No            LOS: 3 days   Time spent: 45 min with >50% on coc    Nolberto Hanlon, MD [facility] Pager [phone]  If 7PM-7AM, please contact night-coverage 12/09/2020, 1:45 PM

## 2020-12-10 LAB — URINE CULTURE: Culture: 3000 — AB

## 2020-12-11 LAB — CULTURE, BLOOD (ROUTINE X 2)
Culture: NO GROWTH
Culture: NO GROWTH
Special Requests: ADEQUATE

## 2020-12-12 ENCOUNTER — Telehealth: Payer: Self-pay

## 2020-12-12 ENCOUNTER — Telehealth: Payer: Self-pay | Admitting: *Deleted

## 2020-12-14 NOTE — Telephone Encounter (Signed)
Oh.. this breaks my heart. He was a very sweet man and his grandson  was so faithful at bringing him to all his appointments.

## 2020-12-25 ENCOUNTER — Ambulatory Visit (INDEPENDENT_AMBULATORY_CARE_PROVIDER_SITE_OTHER): Payer: Medicare Other

## 2020-12-25 DIAGNOSIS — I639 Cerebral infarction, unspecified: Secondary | ICD-10-CM | POA: Diagnosis not present

## 2020-12-27 LAB — CUP PACEART REMOTE DEVICE CHECK
Date Time Interrogation Session: 20220514230616
Implantable Pulse Generator Implant Date: 20200909

## 2021-01-10 NOTE — Telephone Encounter (Signed)
Received VM from pt's grandson Christian Burns that pt passed away peacefully this morning around 3am. Christian Burns voiced appreciation for the care over the years stating "he adored all of you & thought of you as a second family." Dr Marin Olp aware. Flowers ordered.

## 2021-01-10 NOTE — Telephone Encounter (Signed)
Patient passed away over night. His grandson [name] called to cancel upcoming appointment. RN offered our sympathies. Upcoming appointment cancelled. Landis Gandy, RN

## 2021-01-10 DEATH — deceased

## 2021-01-15 ENCOUNTER — Ambulatory Visit: Payer: Medicare HMO | Admitting: Internal Medicine

## 2021-01-16 NOTE — Progress Notes (Signed)
Carelink Summary Report / Loop Recorder 

## 2021-01-22 ENCOUNTER — Encounter: Payer: Self-pay | Admitting: Family

## 2021-01-22 ENCOUNTER — Encounter: Payer: Self-pay | Admitting: Hematology & Oncology

## 2021-04-10 ENCOUNTER — Encounter: Payer: Self-pay | Admitting: Family

## 2021-04-10 ENCOUNTER — Encounter: Payer: Self-pay | Admitting: Hematology & Oncology

## 2021-04-12 ENCOUNTER — Encounter: Payer: Self-pay | Admitting: Family

## 2021-04-12 ENCOUNTER — Encounter: Payer: Self-pay | Admitting: Hematology & Oncology

## 2021-04-13 ENCOUNTER — Encounter: Payer: Self-pay | Admitting: Hematology & Oncology

## 2021-04-13 ENCOUNTER — Encounter: Payer: Self-pay | Admitting: Family

## 2021-05-07 ENCOUNTER — Encounter: Payer: Self-pay | Admitting: Hematology & Oncology

## 2021-05-07 ENCOUNTER — Encounter: Payer: Self-pay | Admitting: Family

## 2021-05-24 ENCOUNTER — Encounter: Payer: Self-pay | Admitting: Hematology & Oncology

## 2021-05-24 ENCOUNTER — Encounter: Payer: Self-pay | Admitting: Family

## 2021-05-29 ENCOUNTER — Encounter: Payer: Self-pay | Admitting: Hematology & Oncology

## 2021-05-29 ENCOUNTER — Encounter: Payer: Self-pay | Admitting: Family

## 2021-07-12 ENCOUNTER — Encounter: Payer: Self-pay | Admitting: Family

## 2021-07-12 ENCOUNTER — Encounter: Payer: Self-pay | Admitting: Hematology & Oncology

## 2021-12-11 ENCOUNTER — Other Ambulatory Visit: Payer: Self-pay | Admitting: Pharmacist
